# Patient Record
Sex: Female | Born: 1945 | Race: White | Hispanic: No | Marital: Married | Smoking: Never smoker
Health system: Southern US, Community
[De-identification: ages and names within clinical notes are randomized; demographics above are authoritative.]

## PROBLEM LIST (undated history)

## (undated) DIAGNOSIS — D689 Coagulation defect, unspecified: Secondary | ICD-10-CM

## (undated) DIAGNOSIS — E785 Hyperlipidemia, unspecified: Secondary | ICD-10-CM

## (undated) DIAGNOSIS — R040 Epistaxis: Secondary | ICD-10-CM

## (undated) DIAGNOSIS — I10 Essential (primary) hypertension: Secondary | ICD-10-CM

## (undated) DIAGNOSIS — M8580 Other specified disorders of bone density and structure, unspecified site: Secondary | ICD-10-CM

## (undated) DIAGNOSIS — N281 Cyst of kidney, acquired: Secondary | ICD-10-CM

## (undated) DIAGNOSIS — K635 Polyp of colon: Secondary | ICD-10-CM

## (undated) DIAGNOSIS — H35033 Hypertensive retinopathy, bilateral: Secondary | ICD-10-CM

## (undated) DIAGNOSIS — E1121 Type 2 diabetes mellitus with diabetic nephropathy: Secondary | ICD-10-CM

## (undated) DIAGNOSIS — T7840XA Allergy, unspecified, initial encounter: Secondary | ICD-10-CM

## (undated) DIAGNOSIS — D649 Anemia, unspecified: Secondary | ICD-10-CM

## (undated) DIAGNOSIS — L719 Rosacea, unspecified: Secondary | ICD-10-CM

## (undated) DIAGNOSIS — M1711 Unilateral primary osteoarthritis, right knee: Secondary | ICD-10-CM

## (undated) DIAGNOSIS — Z5189 Encounter for other specified aftercare: Secondary | ICD-10-CM

## (undated) DIAGNOSIS — H269 Unspecified cataract: Secondary | ICD-10-CM

## (undated) DIAGNOSIS — K219 Gastro-esophageal reflux disease without esophagitis: Secondary | ICD-10-CM

## (undated) DIAGNOSIS — IMO0002 Reserved for concepts with insufficient information to code with codable children: Secondary | ICD-10-CM

## (undated) DIAGNOSIS — N6310 Unspecified lump in the right breast, unspecified quadrant: Secondary | ICD-10-CM

## (undated) DIAGNOSIS — M1990 Unspecified osteoarthritis, unspecified site: Secondary | ICD-10-CM

## (undated) DIAGNOSIS — R918 Other nonspecific abnormal finding of lung field: Secondary | ICD-10-CM

## (undated) DIAGNOSIS — Z8619 Personal history of other infectious and parasitic diseases: Secondary | ICD-10-CM

## (undated) DIAGNOSIS — Z87440 Personal history of urinary (tract) infections: Secondary | ICD-10-CM

## (undated) DIAGNOSIS — K760 Fatty (change of) liver, not elsewhere classified: Secondary | ICD-10-CM

## (undated) DIAGNOSIS — N200 Calculus of kidney: Secondary | ICD-10-CM

## (undated) DIAGNOSIS — D751 Secondary polycythemia: Secondary | ICD-10-CM

## (undated) DIAGNOSIS — K859 Acute pancreatitis without necrosis or infection, unspecified: Secondary | ICD-10-CM

## (undated) HISTORY — DX: Unspecified osteoarthritis, unspecified site: M19.90

## (undated) HISTORY — DX: Other nonspecific abnormal finding of lung field: R91.8

## (undated) HISTORY — DX: Personal history of urinary (tract) infections: Z87.440

## (undated) HISTORY — DX: Hypertensive retinopathy, bilateral: H35.033

## (undated) HISTORY — DX: Essential (primary) hypertension: I10

## (undated) HISTORY — DX: Fatty (change of) liver, not elsewhere classified: K76.0

## (undated) HISTORY — DX: Other specified disorders of bone density and structure, unspecified site: M85.80

## (undated) HISTORY — PX: JOINT REPLACEMENT: SHX530

## (undated) HISTORY — DX: Unspecified cataract: H26.9

## (undated) HISTORY — DX: Rosacea, unspecified: L71.9

## (undated) HISTORY — DX: Unspecified lump in the right breast, unspecified quadrant: N63.10

## (undated) HISTORY — DX: Coagulation defect, unspecified: D68.9

## (undated) HISTORY — DX: Encounter for other specified aftercare: Z51.89

## (undated) HISTORY — DX: Hyperlipidemia, unspecified: E78.5

## (undated) HISTORY — DX: Reserved for concepts with insufficient information to code with codable children: IMO0002

## (undated) HISTORY — DX: Personal history of other infectious and parasitic diseases: Z86.19

## (undated) HISTORY — DX: Secondary polycythemia: D75.1

## (undated) HISTORY — DX: Cyst of kidney, acquired: N28.1

## (undated) HISTORY — PX: COSMETIC SURGERY: SHX468

## (undated) HISTORY — DX: Anemia, unspecified: D64.9

## (undated) HISTORY — DX: Calculus of kidney: N20.0

## (undated) HISTORY — DX: Allergy, unspecified, initial encounter: T78.40XA

## (undated) HISTORY — DX: Polyp of colon: K63.5

## (undated) HISTORY — DX: Type 2 diabetes mellitus with diabetic nephropathy: E11.21

## (undated) HISTORY — DX: Gastro-esophageal reflux disease without esophagitis: K21.9

---
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
## 1982-11-03 HISTORY — PX: VAGINAL HYSTERECTOMY: SUR661

## 1985-11-03 HISTORY — PX: APPENDECTOMY: SHX54

## 2001-11-03 HISTORY — PX: CHOLECYSTECTOMY: SHX55

## 2001-11-03 HISTORY — PX: OTHER SURGICAL HISTORY: SHX169

## 2001-11-11 ENCOUNTER — Encounter: Payer: Self-pay | Admitting: Emergency Medicine

## 2001-11-11 ENCOUNTER — Emergency Department (HOSPITAL_COMMUNITY): Admission: EM | Admit: 2001-11-11 | Discharge: 2001-11-12 | Payer: Self-pay | Admitting: Emergency Medicine

## 2004-04-03 HISTORY — PX: OTHER SURGICAL HISTORY: SHX169

## 2008-09-03 DIAGNOSIS — K635 Polyp of colon: Secondary | ICD-10-CM

## 2008-09-03 HISTORY — DX: Polyp of colon: K63.5

## 2008-09-26 HISTORY — PX: COLONOSCOPY: SHX174

## 2010-06-03 DIAGNOSIS — K760 Fatty (change of) liver, not elsewhere classified: Secondary | ICD-10-CM | POA: Insufficient documentation

## 2011-12-01 ENCOUNTER — Encounter: Payer: Self-pay | Admitting: Family Medicine

## 2011-12-02 ENCOUNTER — Other Ambulatory Visit: Payer: Self-pay | Admitting: Family Medicine

## 2011-12-02 ENCOUNTER — Encounter: Payer: Self-pay | Admitting: Family Medicine

## 2011-12-02 ENCOUNTER — Ambulatory Visit (INDEPENDENT_AMBULATORY_CARE_PROVIDER_SITE_OTHER): Payer: Medicare Other | Admitting: Family Medicine

## 2011-12-02 DIAGNOSIS — K219 Gastro-esophageal reflux disease without esophagitis: Secondary | ICD-10-CM | POA: Insufficient documentation

## 2011-12-02 DIAGNOSIS — K7689 Other specified diseases of liver: Secondary | ICD-10-CM | POA: Diagnosis not present

## 2011-12-02 DIAGNOSIS — G5790 Unspecified mononeuropathy of unspecified lower limb: Secondary | ICD-10-CM

## 2011-12-02 DIAGNOSIS — K635 Polyp of colon: Secondary | ICD-10-CM

## 2011-12-02 DIAGNOSIS — L719 Rosacea, unspecified: Secondary | ICD-10-CM

## 2011-12-02 DIAGNOSIS — I10 Essential (primary) hypertension: Secondary | ICD-10-CM

## 2011-12-02 DIAGNOSIS — D126 Benign neoplasm of colon, unspecified: Secondary | ICD-10-CM

## 2011-12-02 DIAGNOSIS — K760 Fatty (change of) liver, not elsewhere classified: Secondary | ICD-10-CM

## 2011-12-02 DIAGNOSIS — R209 Unspecified disturbances of skin sensation: Secondary | ICD-10-CM | POA: Diagnosis not present

## 2011-12-02 DIAGNOSIS — D751 Secondary polycythemia: Secondary | ICD-10-CM | POA: Insufficient documentation

## 2011-12-02 DIAGNOSIS — Z87440 Personal history of urinary (tract) infections: Secondary | ICD-10-CM

## 2011-12-02 DIAGNOSIS — R7303 Prediabetes: Secondary | ICD-10-CM

## 2011-12-02 DIAGNOSIS — S139XXA Sprain of joints and ligaments of unspecified parts of neck, initial encounter: Secondary | ICD-10-CM

## 2011-12-02 DIAGNOSIS — R202 Paresthesia of skin: Secondary | ICD-10-CM

## 2011-12-02 DIAGNOSIS — J309 Allergic rhinitis, unspecified: Secondary | ICD-10-CM | POA: Insufficient documentation

## 2011-12-02 DIAGNOSIS — R7309 Other abnormal glucose: Secondary | ICD-10-CM

## 2011-12-02 DIAGNOSIS — E1169 Type 2 diabetes mellitus with other specified complication: Secondary | ICD-10-CM | POA: Insufficient documentation

## 2011-12-02 DIAGNOSIS — E1136 Type 2 diabetes mellitus with diabetic cataract: Secondary | ICD-10-CM | POA: Insufficient documentation

## 2011-12-02 DIAGNOSIS — E785 Hyperlipidemia, unspecified: Secondary | ICD-10-CM

## 2011-12-02 DIAGNOSIS — R7401 Elevation of levels of liver transaminase levels: Secondary | ICD-10-CM

## 2011-12-02 DIAGNOSIS — E118 Type 2 diabetes mellitus with unspecified complications: Secondary | ICD-10-CM | POA: Insufficient documentation

## 2011-12-02 DIAGNOSIS — S161XXA Strain of muscle, fascia and tendon at neck level, initial encounter: Secondary | ICD-10-CM

## 2011-12-02 LAB — CBC WITH DIFFERENTIAL/PLATELET
Basophils Relative: 0.8 % (ref 0.0–3.0)
Eosinophils Absolute: 0 10*3/uL (ref 0.0–0.7)
Lymphocytes Relative: 28 % (ref 12.0–46.0)
MCHC: 34.1 g/dL (ref 30.0–36.0)
Monocytes Absolute: 0.4 10*3/uL (ref 0.1–1.0)
Neutrophils Relative %: 64.3 % (ref 43.0–77.0)
Platelets: 224 10*3/uL (ref 150.0–400.0)
RBC: 5.48 Mil/uL — ABNORMAL HIGH (ref 3.87–5.11)
WBC: 5.9 10*3/uL (ref 4.5–10.5)

## 2011-12-02 LAB — COMPREHENSIVE METABOLIC PANEL
ALT: 54 U/L — ABNORMAL HIGH (ref 0–35)
AST: 32 U/L (ref 0–37)
Albumin: 4.5 g/dL (ref 3.5–5.2)
Alkaline Phosphatase: 47 U/L (ref 39–117)
BUN: 18 mg/dL (ref 6–23)
Calcium: 9.9 mg/dL (ref 8.4–10.5)
Chloride: 107 mEq/L (ref 96–112)
Potassium: 4.9 mEq/L (ref 3.5–5.1)
Sodium: 142 mEq/L (ref 135–145)
Total Protein: 7.6 g/dL (ref 6.0–8.3)

## 2011-12-02 LAB — LIPID PANEL
HDL: 50.3 mg/dL (ref >39.00)
LDL Cholesterol: 72 mg/dL (ref 0–99)
Total CHOL/HDL Ratio: 3

## 2011-12-02 LAB — TSH: TSH: 1.65 u[IU]/mL (ref 0.35–5.50)

## 2011-12-02 MED ORDER — AMLODIPINE BESY-BENAZEPRIL HCL 5-20 MG PO CAPS: 1.0000 | ORAL_CAPSULE | Freq: Every day | ORAL | Status: DC

## 2011-12-02 MED ORDER — OMEGA-3-ACID ETHYL ESTERS 1 G PO CAPS: 2.0000 g | ORAL_CAPSULE | Freq: Two times a day (BID) | ORAL | Status: DC

## 2011-12-02 MED ORDER — FENOFIBRATE 145 MG PO TABS: 145.0000 mg | ORAL_TABLET | Freq: Every day | ORAL | Status: DC

## 2011-12-02 MED ORDER — ROSUVASTATIN CALCIUM 5 MG PO TABS: 5.0000 mg | ORAL_TABLET | Freq: Every day | ORAL | Status: DC

## 2011-12-02 MED ORDER — CEPHALEXIN 250 MG PO CAPS: 250.0000 mg | ORAL_CAPSULE | Freq: Every day | ORAL | Status: DC

## 2011-12-02 NOTE — Assessment & Plan Note (Signed)
Improving on its own. Exam normal. Continue ibuprofen prn, bengay. Update me if not improving as expected.

## 2011-12-02 NOTE — Assessment & Plan Note (Signed)
Check a1c today. rec weight loss.

## 2011-12-02 NOTE — Progress Notes (Addendum)
Subjective:    Patient ID: Katherine Oliver, female    DOB: 11-09-1945, 66 y.o.   MRN: [id number]  HPI CC: new medicare pt presents to establish  Left upper thigh - 85mo h/o small area about 3 in square - gets feeling like has burn, sore, tingling.  No redness, no rash.  Comes and goes during day.  Doesn't wear tight pants.  Feels burning intermittently several times a day, lasts about 15 min.  No associated numbness, weakness of leg, back pain, or shooting pain down leg.  Neck pain - bilateral muscle tightness/spasm.  Wonders if due to recent move.  Bothers her for 1-2 wks then improves.  Uses bengay and ibuprofen which helps.    Recurrent UTIs - on keflex 250mg  daily for ppx, has been on this for several years.  H/o HTN - on lotrel and doing well H/o HLD - tolerating chol meds.  No muscle pains.  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  Likes painting, crafts, sewing, house keeping, gardening.  Occasional walking. Diet: ok water intake 4 glasses/day, daily fruits/vegetables, red meat 4x/wk, fish 2x/wk  Preventative: Last medicare wellness visit - has not had. Td 2006 Pneumonia 2008 Flu shot 2012 Colon - last 2009, rpt due 3-5 yrs, adenomatous polyp.  father with colon cancer. Mammogram - 2012 normal. dexa - 2003, normal.  Medications and allergies reviewed and updated in chart.  Past histories reviewed and updated if relevant as below. Patient Active Problem List  Diagnoses  . Prediabetes  . HTN (hypertension)  . HLD (hyperlipidemia)  . Hepatic steatosis  . History of recurrent UTIs  . Transaminitis  . Colon polyp  . Allergic rhinitis  . GERD (gastroesophageal reflux disease)  . Rosacea   Past Medical History  Diagnosis Date  . Prediabetes   . HTN (hypertension)   . HLD (hyperlipidemia)   . Hepatic steatosis   . History of recurrent UTIs     on  chronic keflex  . Transaminitis 06/2010    normal iron sat and viral hep panel  . 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 Arthritis   . Allergic rhinitis   . History of chicken pox   . History of measles   . Rosacea     metrogel  . GERD (gastroesophageal reflux disease)    Past Surgical History  Procedure Date  . Appendectomy 1987  . Vaginal hysterectomy 1984    for menorrhagia, ovaries in place  . Cholecystectomy 2003  . Dexa 2003    normal  . Cardiolite stress test 04/2004    normal  . Cesarean section [id number]    x2  . Colonoscopy 09/26/2008    adenomatous polyp, rpt 5 yrs  . Trigger finger release 2007;2010;2011    bilateral  . Breast biopsy 1963   History  Substance Use Topics  . Smoking status: Never Smoker   . Smokeless tobacco: Never Used  . Alcohol Use: Yes     Occasional   Family History  Problem Relation Age of Onset  . Stroke Mother     several  . Hyperlipidemia Mother   . Hypertension Mother   . Cancer Father     colon  . Hypertension Father   . Hyperlipidemia Father   . Cancer Paternal Aunt     abdominal  . Coronary artery disease Maternal Grandmother   . Diabetes  Maternal Grandfather   . Coronary artery disease Maternal Grandfather    Allergies  Allergen Reactions  . Sulfa Antibiotics   . Adhesive (Tape) Rash    Paper tape - blisters   No current outpatient prescriptions on file prior to visit.     Review of Systems  Constitutional: Negative for fever, chills, activity change, appetite change, fatigue and unexpected weight change.  HENT: Negative for hearing loss and neck pain.   Eyes: Negative for visual disturbance.  Respiratory: Negative for cough, chest tightness, shortness of breath and wheezing.   Cardiovascular: Negative for chest pain, palpitations and leg swelling.  Gastrointestinal: Positive for diarrhea (thinks from abx, takes immodium prn). Negative for nausea, vomiting, abdominal pain, constipation,  blood in stool and abdominal distention.  Genitourinary: Negative for hematuria and difficulty urinating.  Musculoskeletal: Negative for myalgias and arthralgias.  Skin: Negative for rash.  Neurological: Negative for dizziness, seizures, syncope and headaches.  Hematological: Does not bruise/bleed easily.  Psychiatric/Behavioral: Negative for dysphoric mood. The patient is not nervous/anxious.        Objective:   Physical Exam  Nursing note and vitals reviewed. Constitutional: She is oriented to person, place, and time. She appears well-developed and well-nourished. No distress.  HENT:  Head: Normocephalic and atraumatic.  Right Ear: Hearing, tympanic membrane, external ear and ear canal normal.  Left Ear: Hearing, tympanic membrane, external ear and ear canal normal.  Nose: Nose normal.  Mouth/Throat: Oropharynx is clear and moist. No oropharyngeal exudate.  Eyes: Conjunctivae and EOM are normal. Pupils are equal, round, and reactive to light. No scleral icterus.  Neck: Normal range of motion. Neck supple. Carotid bruit is not present. No thyromegaly present.  Cardiovascular: Normal rate, regular rhythm, normal heart sounds and intact distal pulses.   No murmur heard. Pulses:      Radial pulses are 2+ on the right side, and 2+ on the left side.  Pulmonary/Chest: Effort normal and breath sounds normal. No respiratory distress. She has no wheezes. She has no rales.  Abdominal: Soft. Bowel sounds are normal. She exhibits no distension and no mass. There is no tenderness. There is no rebound and no guarding.  Musculoskeletal: Normal range of motion. She exhibits no edema.       FROM shoulders and neck, no trap tightness/spasm noted, no tenderness  Lymphadenopathy:    She has no cervical adenopathy.  Neurological: She is alert and oriented to person, place, and time.       CN grossly intact, station and gait intact  Skin: Skin is warm and dry. No rash noted.       No rash L anterior  thigh, no erythema  Psychiatric: She has a normal mood and affect. Her behavior is normal. Judgment and thought content normal.      Assessment & Plan:

## 2011-12-02 NOTE — Assessment & Plan Note (Signed)
LFTs today

## 2011-12-02 NOTE — Assessment & Plan Note (Signed)
Due for rpt colonocopy early 2014, late 2013.  fmhx colon cancer (father)

## 2011-12-02 NOTE — Patient Instructions (Signed)
Return in 6 months for medicare wellness visit. Blood work today. Good to meet you today, call us with questions.

## 2011-12-02 NOTE — Assessment & Plan Note (Signed)
Continue keflex.  Has been on this for 4 yrs, has taken ppx abx for 7+ yrs, started by urology.

## 2011-12-02 NOTE — Assessment & Plan Note (Signed)
Isolated, very superficial. Check B12 Continue to monitor for now, notify if progressively worsening.

## 2011-12-02 NOTE — Assessment & Plan Note (Signed)
FLP today 

## 2011-12-02 NOTE — Assessment & Plan Note (Signed)
Continue meds, check blood work.

## 2011-12-31 ENCOUNTER — Other Ambulatory Visit: Payer: Self-pay | Admitting: Family Medicine

## 2011-12-31 ENCOUNTER — Other Ambulatory Visit (INDEPENDENT_AMBULATORY_CARE_PROVIDER_SITE_OTHER): Payer: Medicare Other

## 2011-12-31 ENCOUNTER — Encounter: Payer: Self-pay | Admitting: Family Medicine

## 2011-12-31 DIAGNOSIS — D751 Secondary polycythemia: Secondary | ICD-10-CM

## 2011-12-31 DIAGNOSIS — D45 Polycythemia vera: Secondary | ICD-10-CM | POA: Diagnosis not present

## 2011-12-31 LAB — POCT URINALYSIS DIPSTICK
Blood, UA: NEGATIVE
Ketones, UA: NEGATIVE
Spec Grav, UA: 1.025
pH, UA: 6

## 2011-12-31 LAB — CBC WITH DIFFERENTIAL/PLATELET
Eosinophils Relative: 0.1 % (ref 0.0–5.0)
HCT: 46.7 % — ABNORMAL HIGH (ref 36.0–46.0)
Lymphs Abs: 1.7 10*3/uL (ref 0.7–4.0)
MCV: 85.7 fl (ref 78.0–100.0)
Monocytes Absolute: 0.5 10*3/uL (ref 0.1–1.0)
Neutro Abs: 4.3 10*3/uL (ref 1.4–7.7)
Platelets: 219 10*3/uL (ref 150.0–400.0)
WBC: 6.4 10*3/uL (ref 4.5–10.5)

## 2012-02-09 ENCOUNTER — Telehealth: Payer: Self-pay

## 2012-02-09 NOTE — Telephone Encounter (Signed)
Pt said [facility] charged $200 for Amlodipine-Benazapril 5-20 mg and was told by [facility] if got prescriptions sent to [facility] as separate rx for Amlodipine and Benazapril there would be no copay. Pt request 2 prescriptions sent [facility] rather than [facility] drug. Pt can be reached at [phone].Pt seen 12/02/11.

## 2012-02-10 MED ORDER — BENAZEPRIL HCL 20 MG PO TABS: 20.0000 mg | ORAL_TABLET | Freq: Every day | ORAL | Status: DC

## 2012-02-10 MED ORDER — AMLODIPINE BESYLATE 5 MG PO TABS: 5.0000 mg | ORAL_TABLET | Freq: Every day | ORAL | Status: DC

## 2012-02-10 NOTE — Telephone Encounter (Signed)
Changed and sent in.  plz notify pt.

## 2012-02-10 NOTE — Telephone Encounter (Signed)
Message left with patient's husband that separate meds had been sent in and she should receive those in the mail. He advised he would give her the message.

## 2012-03-01 ENCOUNTER — Encounter: Payer: Self-pay | Admitting: Family Medicine

## 2012-03-01 ENCOUNTER — Ambulatory Visit (INDEPENDENT_AMBULATORY_CARE_PROVIDER_SITE_OTHER)
Admission: RE | Admit: 2012-03-01 | Discharge: 2012-03-01 | Disposition: A | Discharge status: Home / Self Care | Payer: Medicare Other | Source: Ambulatory Visit | Attending: Family Medicine | Admitting: Family Medicine

## 2012-03-01 ENCOUNTER — Ambulatory Visit (INDEPENDENT_AMBULATORY_CARE_PROVIDER_SITE_OTHER): Payer: Medicare Other | Admitting: Family Medicine

## 2012-03-01 VITALS — BP 138/88 | HR 72 | Temp 97.3°F | Wt 171.5 lb

## 2012-03-01 DIAGNOSIS — S90129A Contusion of unspecified lesser toe(s) without damage to nail, initial encounter: Secondary | ICD-10-CM

## 2012-03-01 DIAGNOSIS — M25519 Pain in unspecified shoulder: Secondary | ICD-10-CM

## 2012-03-01 DIAGNOSIS — S90222S Contusion of left lesser toe(s) with damage to nail, sequela: Secondary | ICD-10-CM

## 2012-03-01 DIAGNOSIS — M25511 Pain in right shoulder: Secondary | ICD-10-CM

## 2012-03-01 DIAGNOSIS — W208XXA Other cause of strike by thrown, projected or falling object, initial encounter: Secondary | ICD-10-CM | POA: Diagnosis not present

## 2012-03-01 DIAGNOSIS — M7581 Other shoulder lesions, right shoulder: Secondary | ICD-10-CM | POA: Insufficient documentation

## 2012-03-01 DIAGNOSIS — R221 Localized swelling, mass and lump, neck: Secondary | ICD-10-CM

## 2012-03-01 DIAGNOSIS — R220 Localized swelling, mass and lump, head: Secondary | ICD-10-CM | POA: Diagnosis not present

## 2012-03-01 MED ORDER — NAPROXEN 500 MG PO TABS: ORAL_TABLET | ORAL | Status: DC

## 2012-03-01 NOTE — Patient Instructions (Addendum)
xrays today - we will call you with results and plan - may refer you to orthopedist given continued shoulder pain For now, treat with continued ice, anti inflammatory naprosyn twice daily with food for next 3-5 days then as needed. Good to see you today, call us with questions. Let us know if not improving as expected or any worsening. Left great toe - looks like nailroot injury - lets give this more time.

## 2012-03-01 NOTE — Progress Notes (Signed)
  Subjective:    Patient ID: Katherine Oliver, female    DOB: [birth year], 66 y.o.   MRN: [id number]  HPI CC: check shoulder and toe  Injured right shoulder during move here 09/2011.  That cleared up some but now this past week again moving boxes and exherting herself.  Pain returned.  Pain started front of shoulder, now radiating down entire arm.  Very painful.  Strength decreased. Even lifting cup hurts.  Taking ibuprofen, using ice, not helping.  Denies accidents or falls to incite right shoulder pain.  Did have shoulder problems in years past, s/p PT which helped but only temporarily.    Dropped something on toe 1 month ago, bruising still present.  Initially bruised and tender, overall cleared.  Not tender or irritated.  Now nail staying more white, medial side of great toe staying red.  Wants this checked.  Past Medical History  Diagnosis Date  . Prediabetes   . HTN (hypertension)   . HLD (hyperlipidemia)   . Hepatic steatosis   . History of recurrent UTIs     on chronic keflex  . Transaminitis 06/2010    normal iron sat and viral hep panel  . 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 Arthritis   . Allergic rhinitis   . History of chicken pox   . History of measles   . Rosacea     metrogel  . GERD (gastroesophageal reflux disease)   . Polycythemia    Past Surgical History  Procedure Date  . Appendectomy 1987  . Vaginal hysterectomy 1984    for menorrhagia, ovaries in place  . Cholecystectomy 2003  . Dexa 2003    normal  . Cardiolite stress test 04/2004    normal  . Cesarean section [id number]    x2  . Colonoscopy 09/26/2008    adenomatous polyp, rpt 3-5 yrs  . Trigger finger release 2007;2010;2011    bilateral  . Breast biopsy 1963     Review of Systems Per HPI    Objective:   Physical Exam  Nursing note and vitals reviewed. Constitutional: She appears well-developed and well-nourished. No distress.  HENT:  Head: Normocephalic and  atraumatic.  Mouth/Throat: Oropharynx is clear and moist. No oropharyngeal exudate.  Neck: Normal range of motion. Neck supple.  Cardiovascular: Normal rate, regular rhythm, normal heart sounds and intact distal pulses.   No murmur heard. Pulmonary/Chest: Effort normal and breath sounds normal. No respiratory distress. She has no wheezes. She has no rales.  Musculoskeletal:       No midline spine tenderness.  FROM at neck. Fullness, soft tissue swelling superior to right medial clavicle, nonpulsatile Tender to palpation anterior shoulder and upper arm, nontender posterior shoulder. FROM but tender with ROM at right shoulder. Pain with testing SITS against resistance int/ext rotation at shoulder and positive empty can sign.  Lymphadenopathy:    She has no cervical adenopathy.  Skin: Skin is warm and dry. No rash noted.       Left great toe with some erythema medial to nail root.  Nail with white discoloration. No pain with axial loading.  Psychiatric: She has a normal mood and affect.       Assessment & Plan:

## 2012-03-02 ENCOUNTER — Encounter: Payer: Self-pay | Admitting: Family Medicine

## 2012-03-02 DIAGNOSIS — S90229A Contusion of unspecified lesser toe(s) with damage to nail, initial encounter: Secondary | ICD-10-CM | POA: Insufficient documentation

## 2012-03-02 NOTE — Assessment & Plan Note (Signed)
Advised continue to monitor for now.  Has been healing, but likely slow given location. No evidence of infection or deformity or toe fracture today.

## 2012-03-02 NOTE — Assessment & Plan Note (Addendum)
Mechanism of injury is musculoskeletal, however I don't have good explanation for swelling at base of neck on right compared to left side. Xray of shoulder today - overall normal.  Will obtain neck US to eval for mass/blood clot (doubt) and if normal refer to ortho for further evaluation. ?bursitis given significant pain to palpation. Treat with NSAIDs, ice for now while awaiting ultrasound.

## 2012-03-03 ENCOUNTER — Ambulatory Visit: Payer: Self-pay | Admitting: Family Medicine

## 2012-03-03 DIAGNOSIS — R220 Localized swelling, mass and lump, head: Secondary | ICD-10-CM | POA: Diagnosis not present

## 2012-03-03 DIAGNOSIS — R221 Localized swelling, mass and lump, neck: Secondary | ICD-10-CM | POA: Diagnosis not present

## 2012-03-04 ENCOUNTER — Encounter: Payer: Self-pay | Admitting: Family Medicine

## 2012-03-04 ENCOUNTER — Other Ambulatory Visit: Payer: Self-pay | Admitting: Family Medicine

## 2012-03-04 DIAGNOSIS — M25511 Pain in right shoulder: Secondary | ICD-10-CM

## 2012-03-04 DIAGNOSIS — I10 Essential (primary) hypertension: Secondary | ICD-10-CM

## 2012-03-10 DIAGNOSIS — M67919 Unspecified disorder of synovium and tendon, unspecified shoulder: Secondary | ICD-10-CM | POA: Diagnosis not present

## 2012-03-10 DIAGNOSIS — S139XXA Sprain of joints and ligaments of unspecified parts of neck, initial encounter: Secondary | ICD-10-CM | POA: Diagnosis not present

## 2012-03-10 DIAGNOSIS — M719 Bursopathy, unspecified: Secondary | ICD-10-CM | POA: Diagnosis not present

## 2012-03-15 ENCOUNTER — Encounter: Payer: Self-pay | Admitting: Family Medicine

## 2012-04-07 DIAGNOSIS — S139XXA Sprain of joints and ligaments of unspecified parts of neck, initial encounter: Secondary | ICD-10-CM | POA: Diagnosis not present

## 2012-04-07 DIAGNOSIS — M67919 Unspecified disorder of synovium and tendon, unspecified shoulder: Secondary | ICD-10-CM | POA: Diagnosis not present

## 2012-04-07 DIAGNOSIS — M719 Bursopathy, unspecified: Secondary | ICD-10-CM | POA: Diagnosis not present

## 2012-05-20 ENCOUNTER — Other Ambulatory Visit: Payer: Self-pay | Admitting: Family Medicine

## 2012-05-20 DIAGNOSIS — R7303 Prediabetes: Secondary | ICD-10-CM

## 2012-05-20 DIAGNOSIS — I10 Essential (primary) hypertension: Secondary | ICD-10-CM

## 2012-05-20 DIAGNOSIS — D751 Secondary polycythemia: Secondary | ICD-10-CM

## 2012-05-20 DIAGNOSIS — K760 Fatty (change of) liver, not elsewhere classified: Secondary | ICD-10-CM

## 2012-05-20 DIAGNOSIS — E785 Hyperlipidemia, unspecified: Secondary | ICD-10-CM

## 2012-05-24 ENCOUNTER — Other Ambulatory Visit (INDEPENDENT_AMBULATORY_CARE_PROVIDER_SITE_OTHER): Payer: Medicare Other

## 2012-05-24 DIAGNOSIS — E785 Hyperlipidemia, unspecified: Secondary | ICD-10-CM

## 2012-05-24 DIAGNOSIS — R7309 Other abnormal glucose: Secondary | ICD-10-CM | POA: Diagnosis not present

## 2012-05-24 DIAGNOSIS — K7689 Other specified diseases of liver: Secondary | ICD-10-CM | POA: Diagnosis not present

## 2012-05-24 DIAGNOSIS — R7303 Prediabetes: Secondary | ICD-10-CM

## 2012-05-24 DIAGNOSIS — D751 Secondary polycythemia: Secondary | ICD-10-CM

## 2012-05-24 DIAGNOSIS — K760 Fatty (change of) liver, not elsewhere classified: Secondary | ICD-10-CM

## 2012-05-24 DIAGNOSIS — D45 Polycythemia vera: Secondary | ICD-10-CM

## 2012-05-24 LAB — COMPREHENSIVE METABOLIC PANEL
Alkaline Phosphatase: 43 U/L (ref 39–117)
BUN: 19 mg/dL (ref 6–23)
CO2: 25 mEq/L (ref 19–32)
Creatinine, Ser: 1 mg/dL (ref 0.4–1.2)
GFR: 57.32 mL/min — ABNORMAL LOW (ref >60.00)
Glucose, Bld: 132 mg/dL — ABNORMAL HIGH (ref 70–99)
Sodium: 140 mEq/L (ref 135–145)
Total Bilirubin: 0.5 mg/dL (ref 0.3–1.2)
Total Protein: 7.5 g/dL (ref 6.0–8.3)

## 2012-05-24 LAB — LIPID PANEL
Cholesterol: 166 mg/dL (ref 0–200)
HDL: 48.8 mg/dL (ref >39.00)
Triglycerides: 161 mg/dL — ABNORMAL HIGH (ref 0.0–149.0)
VLDL: 32.2 mg/dL (ref 0.0–40.0)

## 2012-05-24 LAB — CBC WITH DIFFERENTIAL/PLATELET
Basophils Relative: 0.9 % (ref 0.0–3.0)
Eosinophils Relative: 0.1 % (ref 0.0–5.0)
HCT: 46.5 % — ABNORMAL HIGH (ref 36.0–46.0)
Hemoglobin: 15.5 g/dL — ABNORMAL HIGH (ref 12.0–15.0)
Lymphs Abs: 1.4 10*3/uL (ref 0.7–4.0)
MCV: 85.3 fl (ref 78.0–100.0)
Monocytes Absolute: 0.5 10*3/uL (ref 0.1–1.0)
Monocytes Relative: 9.7 % (ref 3.0–12.0)
Platelets: 237 10*3/uL (ref 150.0–400.0)
RBC: 5.45 Mil/uL — ABNORMAL HIGH (ref 3.87–5.11)
WBC: 4.8 10*3/uL (ref 4.5–10.5)

## 2012-05-24 LAB — HEMOGLOBIN A1C: Hgb A1c MFr Bld: 5.9 % (ref 4.6–6.5)

## 2012-05-31 ENCOUNTER — Ambulatory Visit (INDEPENDENT_AMBULATORY_CARE_PROVIDER_SITE_OTHER): Payer: Medicare Other | Admitting: Family Medicine

## 2012-05-31 ENCOUNTER — Encounter: Payer: Self-pay | Admitting: Family Medicine

## 2012-05-31 VITALS — BP 120/78 | HR 81 | Temp 98.1°F | Ht 61.0 in | Wt 171.2 lb

## 2012-05-31 DIAGNOSIS — R7303 Prediabetes: Secondary | ICD-10-CM

## 2012-05-31 DIAGNOSIS — R7309 Other abnormal glucose: Secondary | ICD-10-CM

## 2012-05-31 DIAGNOSIS — Z0000 Encounter for general adult medical examination without abnormal findings: Secondary | ICD-10-CM | POA: Insufficient documentation

## 2012-05-31 DIAGNOSIS — D126 Benign neoplasm of colon, unspecified: Secondary | ICD-10-CM

## 2012-05-31 DIAGNOSIS — D45 Polycythemia vera: Secondary | ICD-10-CM

## 2012-05-31 DIAGNOSIS — K635 Polyp of colon: Secondary | ICD-10-CM

## 2012-05-31 DIAGNOSIS — K760 Fatty (change of) liver, not elsewhere classified: Secondary | ICD-10-CM

## 2012-05-31 DIAGNOSIS — Z23 Encounter for immunization: Secondary | ICD-10-CM

## 2012-05-31 DIAGNOSIS — E785 Hyperlipidemia, unspecified: Secondary | ICD-10-CM

## 2012-05-31 DIAGNOSIS — K7689 Other specified diseases of liver: Secondary | ICD-10-CM | POA: Diagnosis not present

## 2012-05-31 DIAGNOSIS — I10 Essential (primary) hypertension: Secondary | ICD-10-CM

## 2012-05-31 DIAGNOSIS — S90129A Contusion of unspecified lesser toe(s) without damage to nail, initial encounter: Secondary | ICD-10-CM

## 2012-05-31 DIAGNOSIS — R7401 Elevation of levels of liver transaminase levels: Secondary | ICD-10-CM

## 2012-05-31 DIAGNOSIS — D751 Secondary polycythemia: Secondary | ICD-10-CM

## 2012-05-31 DIAGNOSIS — S90229A Contusion of unspecified lesser toe(s) with damage to nail, initial encounter: Secondary | ICD-10-CM

## 2012-05-31 NOTE — Assessment & Plan Note (Signed)
Reviewed #s with pt. Has made conscious effort to increase fish in diet. lovaza becoming too expensive, asks about switch to OTC fish oil capsules. Ok to do.  Start by decreasing lovaza to 2gms daily, then convert to 2gm fish oil daily. Continue tricor and crestor.

## 2012-05-31 NOTE — Assessment & Plan Note (Signed)
check abd [place]. If normal, consider chol meds as cause?  As mild and stable, no changes today.

## 2012-05-31 NOTE — Assessment & Plan Note (Signed)
Doubt toenail fungal infection. rec start biotin.

## 2012-05-31 NOTE — Addendum Note (Signed)
Addended byBo Mcclintock on: 05/31/2012 02:12 PM   Modules accepted: Orders

## 2012-05-31 NOTE — Patient Instructions (Addendum)
Call us in October to set up mammogram. Pneumonia shot today. Pass by [name]'s office today to schedule colonoscopy. Advanced directive packet provided today. May decrease lovaza to 2 pills daily, then when you run out change to fish oil 2grams daily.  Good job with fish in diet.  Increase walking with husband. For liver - we will also set you up with abdominal ultrasound.  return in 2 months for some further blood work to check elevated blood count - if hemoglobin staying high, may refer you to blood doctor. Good to see you today, call us with questions.

## 2012-05-31 NOTE — Progress Notes (Signed)
Subjective:    Patient ID: Katherine Oliver, female    DOB: 05-Jan-1946, 66 y.o.   MRN: [id number]  HPI CC: Medicare wellness visit  Presents with husband.  Has some questions about meds.   HLD - lovaza very expensive, wonders if truly needs.  Has started eating more salmon, eating 2x/wk.  On tricor in am and crestor in pm.  Denies myalgias.  H/o mild transaminitis.  Normal viral hep panel and iron studies 2011.  Doesn't think has had abd imaging done in past.   H/o polycythemia, no h/o smoking.  Staying elevated.  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 Likes painting, crafts, sewing, house keeping, gardening. Occasional walking.  Diet: ok water intake 4 glasses/day, daily fruits/vegetables, red meat 4x/wk, fish 3-4x/wk   Preventative:  Last medicare wellness visit - has not had. Td 2006  Pneumonia 2008 (done early 2/2 h/o bronchitis).  Would like pneumovax today. Flu shot 2012  Shingles shot 2010 Colon - last 2009, rpt due 3-5 yrs, adenomatous polyp.  Father with colon cancer.  Would like to schedule rpt colonoscopy. Mammogram - 08/2011 normal.  Would like [place] to schedule rpt. Pap smear - stopped 3-4 yrs ago, s/p hysterectomy for heavy bleeding.  Both ovaries remain. dexa - 2003, normal.  May be due for repeat. Advanced directives: husband [name] is [place].  Hearing and vision screening normal today.  No falls in last year. Denies anger, depression, anhedonia, sadness.  Wt Readings from Last 3 Encounters:  05/31/12 171 lb 4 oz (77.678 kg)  03/01/12 171 lb 8 oz (77.792 kg)  12/02/11 167 lb 4 oz (75.864 kg)   Medications and allergies reviewed and updated in chart.  Past histories reviewed and updated if relevant as below. Patient Active Problem List  Diagnosis  . Prediabetes  . HTN (hypertension)  . HLD (hyperlipidemia)  . Hepatic steatosis  .  History of recurrent UTIs  . Transaminitis  . Colon polyp  . Allergic rhinitis  . GERD (gastroesophageal reflux disease)  . Rosacea  . Neuropathy of thigh  . Neck strain  . Polycythemia  . Rotator cuff tendonitis, right  . Toenail bruise, left, sequela   Past Medical History  Diagnosis Date  . Prediabetes   . HTN (hypertension)   . HLD (hyperlipidemia)   . Hepatic steatosis     ?presumed  . History of recurrent UTIs     on chronic keflex  . Transaminitis 06/2010    normal iron sat and viral hep panel  . 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 Arthritis   . Allergic rhinitis   . History of chicken pox   . History of measles   . Rosacea     metrogel  . GERD (gastroesophageal reflux disease)   . Polycythemia    Past Surgical History  Procedure Date  . Appendectomy 1987  . Vaginal hysterectomy 1984    for menorrhagia, ovaries in place  . Cholecystectomy 2003  . Dexa 2003    normal  . Cardiolite stress test 04/2004    normal  . Cesarean section [id number]    x2  . Colonoscopy 09/26/2008    adenomatous polyp, rpt 3-5 yrs  . Trigger finger release 2007;2010;2011    bilateral  . Breast biopsy 1963   History  Substance Use Topics  . Smoking status: Never Smoker   .  Smokeless tobacco: Never Used  . Alcohol Use: Yes     Occasional   Family History  Problem Relation Age of Onset  . Stroke Mother     several  . Hyperlipidemia Mother   . Hypertension Mother   . Cancer Father     colon  . Hypertension Father   . Hyperlipidemia Father   . Cancer Paternal Aunt     abdominal  . Coronary artery disease Maternal Grandmother   . Diabetes Maternal Grandfather   . Coronary artery disease Maternal Grandfather   . Coronary artery disease Father 25    MIx1, CABG   Allergies  Allergen Reactions  . Sulfa Antibiotics   . Adhesive (Tape) Rash    Paper tape - blisters   Current Outpatient Prescriptions on File Prior to Visit  Medication Sig  Dispense Refill  . amLODipine (NORVASC) 5 MG tablet Take 1 tablet (5 mg total) by mouth daily.  90 tablet  3  . benazepril (LOTENSIN) 20 MG tablet Take 1 tablet (20 mg total) by mouth daily.  90 tablet  3  . cephALEXin (KEFLEX) 250 MG capsule Take 1 capsule (250 mg total) by mouth daily.  90 capsule  3  . fenofibrate (TRICOR) 145 MG tablet Take 1 tablet (145 mg total) by mouth daily.  90 tablet  3  . fluticasone (FLONASE) 50 MCG/ACT nasal spray Place 2 sprays into the nose daily. As needed      . loperamide (IMODIUM) 2 MG capsule Take 2 mg by mouth as needed.      . metronidazole (NORITATE) 1 % cream Apply 1 application topically daily as needed.      . rosuvastatin (CRESTOR) 5 MG tablet Take 1 tablet (5 mg total) by mouth at bedtime.  90 tablet  3  . DISCONTD: omega-3 acid ethyl esters (LOVAZA) 1 G capsule Take 2 capsules (2 g total) by mouth 2 (two) times daily.  360 capsule  3    Review of Systems  Constitutional: Negative for fever, chills, activity change, appetite change, fatigue and unexpected weight change.  HENT: Negative for hearing loss and neck pain.   Eyes: Negative for visual disturbance.  Respiratory: Negative for cough, chest tightness, shortness of breath and wheezing.   Cardiovascular: Negative for chest pain, palpitations and leg swelling.  Gastrointestinal: Negative for nausea, vomiting, abdominal pain, diarrhea (improved), constipation, blood in stool and abdominal distention.  Genitourinary: Negative for hematuria and difficulty urinating.  Musculoskeletal: Negative for myalgias and arthralgias.  Skin: Negative for rash.  Neurological: Negative for dizziness, seizures, syncope and headaches.  Hematological: Does not bruise/bleed easily.  Psychiatric/Behavioral: Negative for dysphoric mood. The patient is not nervous/anxious.        Objective:   Physical Exam  Nursing note and vitals reviewed. Constitutional: She is oriented to person, place, and time. She appears  well-developed and well-nourished. No distress.  HENT:  Head: Normocephalic and atraumatic.  Right Ear: Hearing, tympanic membrane, external ear and ear canal normal.  Left Ear: Hearing, tympanic membrane, external ear and ear canal normal.  Nose: Nose normal.  Mouth/Throat: Oropharynx is clear and moist and mucous membranes are normal. No oropharyngeal exudate, posterior oropharyngeal edema, posterior oropharyngeal erythema or tonsillar abscesses.  Eyes: Conjunctivae and EOM are normal. Pupils are equal, round, and reactive to light. No scleral icterus.  Neck: Normal range of motion. Neck supple. Carotid bruit is not present. No thyromegaly present.  Cardiovascular: Normal rate, regular rhythm, normal heart sounds and intact distal pulses.  No murmur heard. Pulses:      Radial pulses are 2+ on the right side, and 2+ on the left side.  Pulmonary/Chest: Effort normal and breath sounds normal. No respiratory distress. She has no wheezes. She has no rales. Right breast exhibits no inverted nipple, no mass, no nipple discharge, no skin change and no tenderness. Left breast exhibits no inverted nipple, no mass, no nipple discharge, no skin change and no tenderness. Breasts are symmetrical.  Abdominal: Soft. Bowel sounds are normal. She exhibits no distension and no mass. There is no tenderness. There is no rebound and no guarding.  Genitourinary:       deferred  Musculoskeletal: Normal range of motion. She exhibits no edema.  Lymphadenopathy:    She has no cervical adenopathy.    She has no axillary adenopathy.       Right axillary: No lateral adenopathy present.       Left axillary: No lateral adenopathy present.      Right: No supraclavicular adenopathy present.       Left: No supraclavicular adenopathy present.  Neurological: She is alert and oriented to person, place, and time.       CN grossly intact, station and gait intact  Skin: Skin is warm and dry. No rash noted.  Psychiatric: She  has a normal mood and affect. Her behavior is normal. Judgment and thought content normal.       Assessment & Plan:

## 2012-05-31 NOTE — Assessment & Plan Note (Signed)
Lab Results  Component Value Date   HGBA1C 5.9 05/24/2012  reviewed labs. Discussed dietary changes to improve sugars.  Discussed improtance of weight loss. Thinks she may be able to walk more.

## 2012-05-31 NOTE — Assessment & Plan Note (Signed)
Chronic, stable, continue meds

## 2012-05-31 NOTE — Assessment & Plan Note (Signed)
Rpt colonscopy given fmhx, personal hx adenomatous polyp

## 2012-05-31 NOTE — Assessment & Plan Note (Addendum)
Present for last several months. Nonsmoker. epo level normal. With transaminitis, check abd [place] to r/o hepatoma.

## 2012-05-31 NOTE — Assessment & Plan Note (Signed)
I have personally reviewed the Medicare Annual Wellness questionnaire and have noted 1. The patient's medical and social history 2. Their use of alcohol, tobacco or illicit drugs 3. Their current medications and supplements 4. The patient's functional ability including ADL's, fall risks, home safety risks and hearing or visual impairment. 5. Diet and physical activity 6. Evidence for depression or mood disorders The patients weight, height, BMI have been recorded in the chart.  Hearing and vision has been addressed. I have made referrals, counseling and provided education to the patient based review of the above and I have provided the pt with a written personalized care plan for preventive services. See scanned questionairre. Advanced directives discussed: husband is [place]CP[place].  Would like advanced directives info - packet provided.  Reviewed preventative protocols and updated unless pt declined. Pneumovax today. Schedule colonoscopy. Schedule mammogram 08/2012.

## 2012-05-31 NOTE — Assessment & Plan Note (Signed)
I believe this is presumptive dx as no imaging done in past that pt is aware of. Will order abd [place] for longstanding mild transaminitis.

## 2012-06-02 ENCOUNTER — Ambulatory Visit: Payer: Self-pay | Admitting: Family Medicine

## 2012-06-02 ENCOUNTER — Encounter: Payer: Self-pay | Admitting: Family Medicine

## 2012-06-02 DIAGNOSIS — K7689 Other specified diseases of liver: Secondary | ICD-10-CM | POA: Diagnosis not present

## 2012-06-03 ENCOUNTER — Encounter: Payer: Self-pay | Admitting: Family Medicine

## 2012-06-30 DIAGNOSIS — M67919 Unspecified disorder of synovium and tendon, unspecified shoulder: Secondary | ICD-10-CM | POA: Diagnosis not present

## 2012-06-30 DIAGNOSIS — M719 Bursopathy, unspecified: Secondary | ICD-10-CM | POA: Diagnosis not present

## 2012-06-30 DIAGNOSIS — S139XXA Sprain of joints and ligaments of unspecified parts of neck, initial encounter: Secondary | ICD-10-CM | POA: Diagnosis not present

## 2012-08-01 ENCOUNTER — Other Ambulatory Visit: Payer: Self-pay | Admitting: Family Medicine

## 2012-08-01 DIAGNOSIS — K760 Fatty (change of) liver, not elsewhere classified: Secondary | ICD-10-CM

## 2012-08-01 DIAGNOSIS — R7401 Elevation of levels of liver transaminase levels: Secondary | ICD-10-CM

## 2012-08-01 DIAGNOSIS — D751 Secondary polycythemia: Secondary | ICD-10-CM

## 2012-08-02 ENCOUNTER — Encounter: Payer: Self-pay | Admitting: *Deleted

## 2012-08-02 ENCOUNTER — Other Ambulatory Visit (INDEPENDENT_AMBULATORY_CARE_PROVIDER_SITE_OTHER): Payer: Medicare Other

## 2012-08-02 ENCOUNTER — Encounter: Payer: Self-pay | Admitting: Family Medicine

## 2012-08-02 DIAGNOSIS — R7401 Elevation of levels of liver transaminase levels: Secondary | ICD-10-CM

## 2012-08-02 DIAGNOSIS — D45 Polycythemia vera: Secondary | ICD-10-CM | POA: Diagnosis not present

## 2012-08-02 DIAGNOSIS — D751 Secondary polycythemia: Secondary | ICD-10-CM

## 2012-08-02 LAB — CBC WITH DIFFERENTIAL/PLATELET
Basophils Absolute: 0 10*3/uL (ref 0.0–0.1)
Eosinophils Absolute: 0 10*3/uL (ref 0.0–0.7)
HCT: 47.7 % — ABNORMAL HIGH (ref 36.0–46.0)
Hemoglobin: 15.4 g/dL — ABNORMAL HIGH (ref 12.0–15.0)
Lymphs Abs: 1.5 10*3/uL (ref 0.7–4.0)
MCHC: 32.3 g/dL (ref 30.0–36.0)
Monocytes Absolute: 0.4 10*3/uL (ref 0.1–1.0)
Neutro Abs: 2.7 10*3/uL (ref 1.4–7.7)
RDW: 13.9 % (ref 11.5–14.6)

## 2012-08-02 LAB — HEPATIC FUNCTION PANEL: Albumin: 4.3 g/dL (ref 3.5–5.2)

## 2012-08-03 HISTORY — PX: COLONOSCOPY: SHX174

## 2012-08-04 DIAGNOSIS — Z86010 Personal history of colonic polyps: Secondary | ICD-10-CM | POA: Diagnosis not present

## 2012-08-05 ENCOUNTER — Encounter: Payer: Self-pay | Admitting: *Deleted

## 2012-08-05 ENCOUNTER — Ambulatory Visit: Payer: Self-pay | Admitting: Family Medicine

## 2012-08-05 ENCOUNTER — Encounter: Payer: Self-pay | Admitting: Family Medicine

## 2012-08-05 DIAGNOSIS — R928 Other abnormal and inconclusive findings on diagnostic imaging of breast: Secondary | ICD-10-CM | POA: Diagnosis not present

## 2012-08-05 DIAGNOSIS — Z23 Encounter for immunization: Secondary | ICD-10-CM | POA: Diagnosis not present

## 2012-08-05 DIAGNOSIS — Z1231 Encounter for screening mammogram for malignant neoplasm of breast: Secondary | ICD-10-CM | POA: Diagnosis not present

## 2012-08-27 DIAGNOSIS — R7401 Elevation of levels of liver transaminase levels: Secondary | ICD-10-CM | POA: Diagnosis not present

## 2012-08-31 ENCOUNTER — Ambulatory Visit: Payer: Self-pay | Admitting: Gastroenterology

## 2012-08-31 DIAGNOSIS — E785 Hyperlipidemia, unspecified: Secondary | ICD-10-CM | POA: Diagnosis not present

## 2012-08-31 DIAGNOSIS — Z09 Encounter for follow-up examination after completed treatment for conditions other than malignant neoplasm: Secondary | ICD-10-CM | POA: Diagnosis not present

## 2012-08-31 DIAGNOSIS — Z800 Family history of malignant neoplasm of digestive organs: Secondary | ICD-10-CM | POA: Diagnosis not present

## 2012-08-31 DIAGNOSIS — D126 Benign neoplasm of colon, unspecified: Secondary | ICD-10-CM | POA: Diagnosis not present

## 2012-08-31 DIAGNOSIS — Z90710 Acquired absence of both cervix and uterus: Secondary | ICD-10-CM | POA: Diagnosis not present

## 2012-08-31 DIAGNOSIS — Z860100 Personal history of colon polyps, unspecified: Secondary | ICD-10-CM | POA: Diagnosis not present

## 2012-08-31 DIAGNOSIS — Z882 Allergy status to sulfonamides status: Secondary | ICD-10-CM | POA: Diagnosis not present

## 2012-08-31 DIAGNOSIS — I10 Essential (primary) hypertension: Secondary | ICD-10-CM | POA: Diagnosis not present

## 2012-08-31 DIAGNOSIS — Z9089 Acquired absence of other organs: Secondary | ICD-10-CM | POA: Diagnosis not present

## 2012-08-31 DIAGNOSIS — K5730 Diverticulosis of large intestine without perforation or abscess without bleeding: Secondary | ICD-10-CM | POA: Diagnosis not present

## 2012-08-31 DIAGNOSIS — K7689 Other specified diseases of liver: Secondary | ICD-10-CM | POA: Diagnosis not present

## 2012-08-31 DIAGNOSIS — Z79899 Other long term (current) drug therapy: Secondary | ICD-10-CM | POA: Diagnosis not present

## 2012-09-07 ENCOUNTER — Other Ambulatory Visit: Payer: Self-pay | Admitting: *Deleted

## 2012-09-07 MED ORDER — CEPHALEXIN 250 MG PO CAPS: 250.0000 mg | ORAL_CAPSULE | Freq: Every day | ORAL | Status: DC

## 2012-11-05 ENCOUNTER — Other Ambulatory Visit: Payer: Self-pay | Admitting: Family Medicine

## 2012-12-02 ENCOUNTER — Ambulatory Visit (INDEPENDENT_AMBULATORY_CARE_PROVIDER_SITE_OTHER): Payer: Medicare Other | Admitting: Family Medicine

## 2012-12-02 ENCOUNTER — Encounter: Payer: Self-pay | Admitting: Family Medicine

## 2012-12-02 VITALS — BP 134/92 | HR 80 | Temp 98.2°F | Wt 174.8 lb

## 2012-12-02 DIAGNOSIS — K219 Gastro-esophageal reflux disease without esophagitis: Secondary | ICD-10-CM

## 2012-12-02 DIAGNOSIS — Z87440 Personal history of urinary (tract) infections: Secondary | ICD-10-CM

## 2012-12-02 DIAGNOSIS — D126 Benign neoplasm of colon, unspecified: Secondary | ICD-10-CM

## 2012-12-02 DIAGNOSIS — I10 Essential (primary) hypertension: Secondary | ICD-10-CM

## 2012-12-02 DIAGNOSIS — K635 Polyp of colon: Secondary | ICD-10-CM

## 2012-12-02 MED ORDER — FLUTICASONE PROPIONATE 50 MCG/ACT NA SUSP: 2.0000 | Freq: Every day | NASAL | Status: DC

## 2012-12-02 MED ORDER — CEPHALEXIN 250 MG PO CAPS: 250.0000 mg | ORAL_CAPSULE | Freq: Every day | ORAL | Status: DC

## 2012-12-02 NOTE — Progress Notes (Signed)
  Subjective:    Patient ID: Katherine Oliver, female    DOB: 11/11/1945, 67 y.o.   MRN: [id number]  HPI CC: 6 mo f/u  No questions or concerns.  Recurrent UTIs - controlled on keflex 250mg  daily.  HTN - No HA, vision changes, CP/tightness, SOB, leg swelling.  Compliant with amlodipine and benazepril.  At home running systolic [id number].  Avoids salt.  Exercise - walking, house work, gardening.  Good vegetable intake  Colonoscopy 08/2012 - 1 adenomatous polyp, with diverticulosis, rec rpt 5 yrs ([place]).  No records of this - will fill ROI to receive records  Shoulders starting to bother her again - planning on scheduling ortho f/u.  GERD - controlled with OTC antihistamine  Flu shot done.  Past Medical History  Diagnosis Date  . Prediabetes   . HTN (hypertension)   . HLD (hyperlipidemia)   . Hepatic steatosis     by abd [place] 05/2012, mild transaminitis - normal iron sat and viral hep panel (2011)  . History of recurrent UTIs     on chronic keflex  . 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 Arthritis   . Allergic rhinitis   . History of chicken pox   . History of measles   . Rosacea     metrogel  . GERD (gastroesophageal reflux disease)   . Polycythemia     mild, stable (2013)     Review of Systems Per HPI    Objective:   Physical Exam  Nursing note and vitals reviewed. Constitutional: She appears well-developed and well-nourished. No distress.  HENT:  Head: Normocephalic and atraumatic.  Mouth/Throat: Oropharynx is clear and moist. No oropharyngeal exudate.  Eyes: Conjunctivae normal and EOM are normal. Pupils are equal, round, and reactive to light. No scleral icterus.  Neck: Normal range of motion. Neck supple. Carotid bruit is not present. No thyromegaly present.  Cardiovascular: Normal rate, regular rhythm, normal heart sounds and intact distal pulses.   No murmur heard. Pulmonary/Chest: Effort normal and breath sounds normal. No  respiratory distress. She has no wheezes. She has no rales.  Musculoskeletal: She exhibits no edema.  Lymphadenopathy:    She has no cervical adenopathy.  Skin: Skin is warm and dry. No rash noted.        Assessment & Plan:

## 2012-12-02 NOTE — Assessment & Plan Note (Signed)
Chronic, stable. Continue meds. 

## 2012-12-02 NOTE — Assessment & Plan Note (Signed)
Controlled with diet and OTC meds.

## 2012-12-02 NOTE — Assessment & Plan Note (Signed)
Keflex refilled to [facility].

## 2012-12-02 NOTE — Assessment & Plan Note (Signed)
Will request records from [facility]

## 2012-12-02 NOTE — Patient Instructions (Signed)
Sign release form for recent colonoscopy from [facility]. Return in 6 months fasting for blood work and afterwards for [employer] visit.

## 2012-12-14 ENCOUNTER — Encounter: Payer: Self-pay | Admitting: Family Medicine

## 2012-12-24 ENCOUNTER — Encounter: Payer: Self-pay | Admitting: Family Medicine

## 2013-02-24 DIAGNOSIS — S43429A Sprain of unspecified rotator cuff capsule, initial encounter: Secondary | ICD-10-CM | POA: Diagnosis not present

## 2013-03-04 DIAGNOSIS — M19019 Primary osteoarthritis, unspecified shoulder: Secondary | ICD-10-CM | POA: Diagnosis not present

## 2013-05-25 ENCOUNTER — Other Ambulatory Visit: Payer: Self-pay | Admitting: Family Medicine

## 2013-05-25 DIAGNOSIS — Z0000 Encounter for general adult medical examination without abnormal findings: Secondary | ICD-10-CM

## 2013-05-25 DIAGNOSIS — D751 Secondary polycythemia: Secondary | ICD-10-CM

## 2013-05-25 DIAGNOSIS — I10 Essential (primary) hypertension: Secondary | ICD-10-CM

## 2013-05-25 DIAGNOSIS — K760 Fatty (change of) liver, not elsewhere classified: Secondary | ICD-10-CM

## 2013-05-25 DIAGNOSIS — R7303 Prediabetes: Secondary | ICD-10-CM

## 2013-05-25 DIAGNOSIS — E785 Hyperlipidemia, unspecified: Secondary | ICD-10-CM

## 2013-05-26 ENCOUNTER — Other Ambulatory Visit (INDEPENDENT_AMBULATORY_CARE_PROVIDER_SITE_OTHER): Payer: Medicare Other

## 2013-05-26 DIAGNOSIS — D45 Polycythemia vera: Secondary | ICD-10-CM | POA: Diagnosis not present

## 2013-05-26 DIAGNOSIS — D751 Secondary polycythemia: Secondary | ICD-10-CM

## 2013-05-26 DIAGNOSIS — K7689 Other specified diseases of liver: Secondary | ICD-10-CM | POA: Diagnosis not present

## 2013-05-26 DIAGNOSIS — R7309 Other abnormal glucose: Secondary | ICD-10-CM | POA: Diagnosis not present

## 2013-05-26 DIAGNOSIS — R7303 Prediabetes: Secondary | ICD-10-CM

## 2013-05-26 DIAGNOSIS — E785 Hyperlipidemia, unspecified: Secondary | ICD-10-CM | POA: Diagnosis not present

## 2013-05-26 DIAGNOSIS — I10 Essential (primary) hypertension: Secondary | ICD-10-CM

## 2013-05-26 DIAGNOSIS — K760 Fatty (change of) liver, not elsewhere classified: Secondary | ICD-10-CM

## 2013-05-26 LAB — CBC WITH DIFFERENTIAL/PLATELET
Basophils Absolute: 0 10*3/uL (ref 0.0–0.1)
Basophils Relative: 0.7 % (ref 0.0–3.0)
Eosinophils Relative: 0 % (ref 0.0–5.0)
Hemoglobin: 14.8 g/dL (ref 12.0–15.0)
Lymphocytes Relative: 33.1 % (ref 12.0–46.0)
Monocytes Relative: 9.4 % (ref 3.0–12.0)
Neutro Abs: 2.9 10*3/uL (ref 1.4–7.7)
RBC: 5.12 Mil/uL — ABNORMAL HIGH (ref 3.87–5.11)
WBC: 5 10*3/uL (ref 4.5–10.5)

## 2013-05-26 LAB — LIPID PANEL
HDL: 44.3 mg/dL (ref >39.00)
Total CHOL/HDL Ratio: 3
Triglycerides: 155 mg/dL — ABNORMAL HIGH (ref 0.0–149.0)
VLDL: 31 mg/dL (ref 0.0–40.0)

## 2013-05-26 LAB — COMPREHENSIVE METABOLIC PANEL
Alkaline Phosphatase: 44 U/L (ref 39–117)
CO2: 25 mEq/L (ref 19–32)
Creatinine, Ser: 0.9 mg/dL (ref 0.4–1.2)
GFR: 68.66 mL/min (ref >60.00)
Glucose, Bld: 113 mg/dL — ABNORMAL HIGH (ref 70–99)
Total Bilirubin: 0.6 mg/dL (ref 0.3–1.2)

## 2013-05-26 LAB — HEMOGLOBIN A1C: Hgb A1c MFr Bld: 6.1 % (ref 4.6–6.5)

## 2013-06-02 ENCOUNTER — Encounter: Payer: Self-pay | Admitting: Family Medicine

## 2013-06-02 ENCOUNTER — Ambulatory Visit (INDEPENDENT_AMBULATORY_CARE_PROVIDER_SITE_OTHER): Payer: Medicare Other | Admitting: Family Medicine

## 2013-06-02 VITALS — BP 160/100 | HR 88 | Temp 98.0°F | Ht 61.0 in | Wt 175.5 lb

## 2013-06-02 DIAGNOSIS — I10 Essential (primary) hypertension: Secondary | ICD-10-CM

## 2013-06-02 DIAGNOSIS — D45 Polycythemia vera: Secondary | ICD-10-CM

## 2013-06-02 DIAGNOSIS — N951 Menopausal and female climacteric states: Secondary | ICD-10-CM | POA: Diagnosis not present

## 2013-06-02 DIAGNOSIS — E785 Hyperlipidemia, unspecified: Secondary | ICD-10-CM | POA: Diagnosis not present

## 2013-06-02 DIAGNOSIS — L719 Rosacea, unspecified: Secondary | ICD-10-CM

## 2013-06-02 DIAGNOSIS — Z0000 Encounter for general adult medical examination without abnormal findings: Secondary | ICD-10-CM

## 2013-06-02 DIAGNOSIS — Z87440 Personal history of urinary (tract) infections: Secondary | ICD-10-CM | POA: Diagnosis not present

## 2013-06-02 DIAGNOSIS — R7309 Other abnormal glucose: Secondary | ICD-10-CM

## 2013-06-02 DIAGNOSIS — R7303 Prediabetes: Secondary | ICD-10-CM

## 2013-06-02 DIAGNOSIS — D751 Secondary polycythemia: Secondary | ICD-10-CM

## 2013-06-02 MED ORDER — DOXYCYCLINE MONOHYDRATE 50 MG PO TABS: 50.0000 mg | ORAL_TABLET | Freq: Every day | ORAL | Status: DC

## 2013-06-02 MED ORDER — AMLODIPINE BESYLATE 10 MG PO TABS: 10.0000 mg | ORAL_TABLET | Freq: Every day | ORAL | Status: DC

## 2013-06-02 NOTE — Assessment & Plan Note (Signed)
Stop keflex, continue doxycycline 50mg  daily.  Monitor for recurrence. Pt agrees with plan.

## 2013-06-02 NOTE — Assessment & Plan Note (Signed)
Improved. Continue to monitor. 

## 2013-06-02 NOTE — Progress Notes (Signed)
Subjective:    Patient ID: Katherine Oliver, female    DOB: [birth year], 67 y.o.   MRN: [id number]  HPI CC: medicare wellness visit  HTN - BP occasionally elevated at home (once a month).  May slowly be trending up over last 2 years. Rosacea - on doxy 50mg  daily - started after metrogel wasn't helping control. Recurrent UTIs - on keflex 250mg  daily for the last 5 years. Currently taking both keflex 250mg  and doxycycline 50mg  daily.  R ear very itchy - would like evaluated.  Has tried rubbing alcohol and peroxide.  No hearing changes. Concern about weight - despite decreasing portion sizes, increased walking, trouble losing weight.   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 Likes painting, crafts, sewing, house keeping, gardening. Occasional walking.  Diet: ok water intake 4 glasses/day, daily fruits/vegetables, red meat 4x/wk, fish 3-4x/wk   Preventative:  Colon - adenomatous polyps, diverticulosis, rec rpt 5 yrs [name]) Father with colon cancer age 32s.  Mammogram - due 08/2013  Pap smear - stopped 4-5 yrs ago, s/p hysterectomy for heavy bleeding. Both ovaries remain.  Declines continued pelvic exam, discussed changes to monitor for. dexa - 2003, normal. Due for rpt. Pneumovax 05/2012  Td 2006  Flu shot 2013  Shingles shot 2010  Advanced directives: husband [name] is [place]. Has at home.  Hearing and vision screening normal today.  No falls in last year.  Denies anger, depression, anhedonia, sadness.  Medications and allergies reviewed and updated in chart.  Past histories reviewed and updated if relevant as below. Patient Active Problem List   Diagnosis Date Noted  . Medicare annual wellness visit, subsequent  05/31/2012  . Contusion of toenail 03/02/2012  . Rotator cuff tendonitis, right 03/01/2012  . 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 Polycythemia 12/02/2011  . Prediabetes   . HTN (hypertension)   . HLD (hyperlipidemia)   . Hepatic steatosis   . History of recurrent UTIs   . Allergic rhinitis   . GERD (gastroesophageal reflux disease)   . Rosacea   . Transaminitis 06/03/2010  . Colon polyp 09/03/2008   Past Medical History  Diagnosis Date  . Prediabetes   . HTN (hypertension)   . HLD (hyperlipidemia)   . Hepatic steatosis     by abd [place] 05/2012, mild transaminitis - normal iron sat and viral hep panel (2011)  . History of recurrent UTIs     on chronic keflex  . 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 Arthritis   . Allergic rhinitis   . History of chicken pox   . History of measles   . Rosacea     metrogel  . GERD (gastroesophageal reflux disease)   . Polycythemia     mild, stable (2013)   Past Surgical History  Procedure Laterality Date  . Appendectomy  1987  . Vaginal hysterectomy  1984    for menorrhagia, ovaries in place  . Cholecystectomy  2003  . Dexa  2003    normal  . Cardiolite stress test  04/2004    normal  . Cesarean section  [id number]    x2  . Colonoscopy  09/26/2008    adenomatous polyp,  rpt 3-5 yrs  . Trigger finger release  2007;2010;2011    bilateral  . Breast biopsy  1963  . Colonoscopy  08/2012    adenomatous polyps, diverticulosis, rec rpt 5 yrs [name])   History  Substance Use Topics  . Smoking status: Never Smoker   . Smokeless tobacco: Never Used  . Alcohol Use: Yes     Comment: Occasional   Family History  Problem Relation Age of Onset  . Stroke Mother     several  . Hyperlipidemia Mother   . Hypertension Mother   . Cancer Father     colon  . Hypertension Father   . Hyperlipidemia Father   . Cancer Paternal Aunt     abdominal  . Coronary artery disease Maternal Grandmother   . Diabetes Maternal  Grandfather   . Coronary artery disease Maternal Grandfather   . Coronary artery disease Father 60    MIx1, CABG   Allergies  Allergen Reactions  . Sulfa Antibiotics   . Adhesive (Tape) Rash    Paper tape - blisters   Current Outpatient Prescriptions on File Prior to Visit  Medication Sig Dispense Refill  . amLODipine (NORVASC) 5 MG tablet Take 1 tablet by mouth  daily  90 tablet  2  . benazepril (LOTENSIN) 20 MG tablet Take 1 tablet by mouth  daily  90 tablet  2  . cephALEXin (KEFLEX) 250 MG capsule Take 1 capsule (250 mg total) by mouth daily.  90 capsule  3  . CRESTOR 5 MG tablet Take 1 tablet by mouth at  bedtime  90 each  2  . fenofibrate (TRICOR) 145 MG tablet Take 1 tablet (145 mg total) by mouth daily.  90 tablet  3  . fish oil-omega-3 fatty acids 1000 MG capsule Take 3 g by mouth daily.       . fluticasone (FLONASE) 50 MCG/ACT nasal spray Place 2 sprays into the nose daily. As needed  48 g  3  . loperamide (IMODIUM) 2 MG capsule Take 2 mg by mouth as needed.      . metronidazole (NORITATE) 1 % cream Apply 1 application topically daily as needed.       No current facility-administered medications on file prior to visit.     Review of Systems     Objective:   Physical Exam  Nursing note and vitals reviewed. Constitutional: She is oriented to person, place, and time. She appears well-developed and well-nourished. No distress.  HENT:  Head: Normocephalic and atraumatic.  Right Ear: Hearing, tympanic membrane, external ear and ear canal normal.  Left Ear: Hearing, tympanic membrane, external ear and ear canal normal.  Nose: Nose normal.  Mouth/Throat: Oropharynx is clear and moist. No oropharyngeal exudate.  Ext and int ears WNL  Eyes: Conjunctivae and EOM are normal. Pupils are equal, round, and reactive to light. No scleral icterus.  Neck: Normal range of motion. Neck supple. Carotid bruit is not present. No thyromegaly present.  Cardiovascular: Normal rate, regular  rhythm, normal heart sounds and intact distal pulses.   No murmur heard. Pulses:      Radial pulses are 2+ on the right side, and 2+ on the left side.  Pulmonary/Chest: Effort normal and breath sounds normal. No respiratory distress. She has no wheezes. She has no rales. Right breast exhibits no inverted nipple, no mass, no nipple discharge, no skin change and no tenderness. Left breast exhibits no inverted nipple, no mass, no nipple discharge, no skin change and no  tenderness.  Abdominal: Soft. Bowel sounds are normal. She exhibits no distension and no mass. There is no tenderness. There is no rebound and no guarding.  Genitourinary:  declined  Musculoskeletal: Normal range of motion.  Lymphadenopathy:    She has no cervical adenopathy.    She has no axillary adenopathy.       Right axillary: No lateral adenopathy present.       Left axillary: No lateral adenopathy present.      Right: No supraclavicular adenopathy present.       Left: No supraclavicular adenopathy present.  Neurological: She is alert and oriented to person, place, and time.  CN grossly intact, station and gait intact  Skin: Skin is warm and dry. No rash noted.  Psychiatric: She has a normal mood and affect. Her behavior is normal. Judgment and thought content normal.       Assessment & Plan:

## 2013-06-02 NOTE — Assessment & Plan Note (Signed)
Chronic, stable. Tolerating meds. Continue meds.

## 2013-06-02 NOTE — Assessment & Plan Note (Signed)
I have personally reviewed the Medicare Annual Wellness questionnaire and have noted 1. The patient's medical and social history 2. Their use of alcohol, tobacco or illicit drugs 3. Their current medications and supplements 4. The patient's functional ability including ADL's, fall risks, home safety risks and hearing or visual impairment. 5. Diet and physical activity 6. Evidence for depression or mood disorders The patients weight, height, BMI have been recorded in the chart.  Hearing and vision has been addressed. I have made referrals, counseling and provided education to the patient based review of the above and I have provided the pt with a written personalized care plan for preventive services. See scanned questionairre. Advanced directives discussed:will bring me copy when they find it at home  Reviewed preventative protocols and updated unless pt declined.  Mammogram due 08/2013 UTD colonoscopy and immunizations. dexa scheduled today. Declines pelvic exam.

## 2013-06-02 NOTE — Assessment & Plan Note (Signed)
Chronic, elevated. Start with increase in amlodipine to 10mg  daily. Monitor for better control, watch for ankle edema.

## 2013-06-02 NOTE — Assessment & Plan Note (Signed)
Reviewed low carb diet.

## 2013-06-02 NOTE — Assessment & Plan Note (Signed)
metrogel not effective- had started old doxy 50mg  which has been effective.  Requests refill.

## 2013-06-02 NOTE — Patient Instructions (Signed)
Let's stop keflex and only continue doxycycline 50mg  daily.  We will monitor for any recurrence of urinary symptoms. Let us know in September for referral for mammogram at [facility]. Pass by [name]'s office to schedule bone density scan. Bring me copy of advanced directive to update your chart. Let's increase amlodipine to 10mg  daily (dpouble up until you run out, then fill new prescription for 10mg  daily) Good to see you today, call us with questions

## 2013-06-03 DIAGNOSIS — M8580 Other specified disorders of bone density and structure, unspecified site: Secondary | ICD-10-CM | POA: Insufficient documentation

## 2013-06-03 HISTORY — PX: OTHER SURGICAL HISTORY: SHX169

## 2013-06-03 HISTORY — DX: Other specified disorders of bone density and structure, unspecified site: M85.80

## 2013-06-06 ENCOUNTER — Encounter: Payer: Self-pay | Admitting: Family Medicine

## 2013-06-06 ENCOUNTER — Ambulatory Visit: Payer: Self-pay | Admitting: Family Medicine

## 2013-06-06 DIAGNOSIS — M949 Disorder of cartilage, unspecified: Secondary | ICD-10-CM | POA: Diagnosis not present

## 2013-06-06 DIAGNOSIS — M899 Disorder of bone, unspecified: Secondary | ICD-10-CM | POA: Diagnosis not present

## 2013-06-07 ENCOUNTER — Encounter: Payer: Self-pay | Admitting: *Deleted

## 2013-06-22 ENCOUNTER — Other Ambulatory Visit: Payer: Self-pay | Admitting: Family Medicine

## 2013-07-13 ENCOUNTER — Encounter: Payer: Self-pay | Admitting: Internal Medicine

## 2013-07-13 ENCOUNTER — Ambulatory Visit (INDEPENDENT_AMBULATORY_CARE_PROVIDER_SITE_OTHER): Payer: Medicare Other | Admitting: Internal Medicine

## 2013-07-13 VITALS — BP 140/80 | HR 97 | Temp 97.7°F | Wt 176.0 lb

## 2013-07-13 DIAGNOSIS — B379 Candidiasis, unspecified: Secondary | ICD-10-CM | POA: Diagnosis not present

## 2013-07-13 DIAGNOSIS — N390 Urinary tract infection, site not specified: Secondary | ICD-10-CM

## 2013-07-13 LAB — POCT URINALYSIS DIPSTICK
Bilirubin, UA: NEGATIVE
Glucose, UA: NEGATIVE
Leukocytes, UA: NEGATIVE
Nitrite, UA: POSITIVE
Urobilinogen, UA: NEGATIVE
pH, UA: 6

## 2013-07-13 MED ORDER — FLUCONAZOLE 150 MG PO TABS: 150.0000 mg | ORAL_TABLET | Freq: Once | ORAL | Status: DC

## 2013-07-13 MED ORDER — CIPROFLOXACIN HCL 500 MG PO TABS: 500.0000 mg | ORAL_TABLET | Freq: Two times a day (BID) | ORAL | Status: DC

## 2013-07-13 NOTE — Progress Notes (Signed)
HPI  Pt presents to the clinic today with c/o urinary frequency, burning with urination and flank pain. This started 2 days ago, She denies nausea, vomiting or fever. She has had UTI's in the past and reports this feels the same. She has been on prophylactic keflex in the past for recurrent UTI's. She was however taken off of it as she was put on doxy for rosacea. She thinks the doxy may have caused a yeast infection. She has been using OTC monistat which has given her some relief.   Review of Systems  Past Medical History  Diagnosis Date  . Prediabetes   . HTN (hypertension)   . HLD (hyperlipidemia)   . Hepatic steatosis     by abd [place] 05/2012, mild transaminitis - normal iron sat and viral hep panel (2011)  . History of recurrent UTIs     on chronic keflex  . 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 Arthritis   . Allergic rhinitis   . History of chicken pox   . History of measles   . Rosacea     metrogel  . GERD (gastroesophageal reflux disease)   . Polycythemia     mild, stable (2013)  . Osteopenia 06/2013    mild, forearm T -1.1, hip and spine WNL    Family History  Problem Relation Age of Onset  . Stroke Mother     several  . Hyperlipidemia Mother   . Hypertension Mother   . Cancer Father     colon  . Hypertension Father   . Hyperlipidemia Father   . Cancer Paternal Aunt     abdominal  . Coronary artery disease Maternal Grandmother   . Diabetes Maternal Grandfather   . Coronary artery disease Maternal Grandfather   . Coronary artery disease Father 23    MIx1, CABG    History   Social History  . Marital Status: Married    Spouse Name: N/A    Number of Children: N/A  . Years of Education: N/A   Occupational History  . Not on file.   Social History Main Topics  . Smoking status: Never Smoker   . Smokeless tobacco: Never Used  . Alcohol Use: Yes     Comment: Occasional  . Drug Use: No  . Sexual Activity: Not on file   Other Topics  Concern  . 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  Likes painting, crafts, sewing, house keeping, gardening.  Occasional walking.   Diet: ok water intake 4 glasses/day, daily fruits/vegetables, red meat 4x/wk, fish 3-4x/wk    Allergies  Allergen Reactions  . Sulfa Antibiotics   . Adhesive [Tape] Rash    Paper tape - blisters    Constitutional: Denies fever, malaise, fatigue, headache or abrupt weight changes.   GU: Pt reports urgency, frequency and pain with urination, vaginal discharge.. Denies burning sensation, blood in urine, odor. Skin: Denies redness, rashes, lesions or ulcercations.   No other specific complaints in a complete review of systems (except as listed in HPI above).    Objective:   Physical Exam  BP 140/80  Pulse 97  Temp(Src) 97.7 F (36.5 C) (Tympanic)  Wt 176 lb (79.833 kg)  BMI 33.27 kg/m2  SpO2 96% Wt Readings  from Last 3 Encounters:  07/13/13 176 lb (79.833 kg)  06/02/13 175 lb 8 oz (79.606 kg)  12/02/12 174 lb 12 oz (79.266 kg)    General: Appears her stated age, well developed, well nourished in NAD. Cardiovascular: Normal rate and rhythm. S1,S2 noted.  No murmur, rubs or gallops noted. No JVD or BLE edema. No carotid bruits noted. Pulmonary/Chest: Normal effort and positive vesicular breath sounds. No respiratory distress. No wheezes, rales or ronchi noted.  Abdomen: Soft and nontender. Normal bowel sounds, no bruits noted. No distention or masses noted. Liver, spleen and kidneys non palpable. Tender to palpation over the bladder area. No CVA tenderness.      Assessment & Plan:   Urgency, frequency and dysuria secondary to UTI:  eRx for Cipro 500 mg BID x 5 days Urinalysis and urine culture Drink plenty of fluids  Candida vaginitis secondary to  antibiotic use:  eRx for Diflucan 150 mg POx 1 with 1 refill  RTC as needed or if symptoms persist.

## 2013-07-13 NOTE — Patient Instructions (Signed)
Urinary Tract Infection  Urinary tract infections (UTIs) can develop anywhere along your urinary tract. Your urinary tract is your body's drainage system for removing wastes and extra water. Your urinary tract includes two kidneys, two ureters, a bladder, and a urethra. Your kidneys are a pair of bean-shaped organs. Each kidney is about the size of your fist. They are located below your ribs, one on each side of your spine.  CAUSES  Infections are caused by microbes, which are microscopic organisms, including fungi, viruses, and bacteria. These organisms are so small that they can only be seen through a microscope. Bacteria are the microbes that most commonly cause UTIs.  SYMPTOMS   Symptoms of UTIs may vary by age and gender of the patient and by the location of the infection. Symptoms in young women typically include a frequent and intense urge to urinate and a painful, burning feeling in the bladder or urethra during urination. Older women and men are more likely to be tired, shaky, and weak and have muscle aches and abdominal pain. A fever may mean the infection is in your kidneys. Other symptoms of a kidney infection include pain in your back or sides below the ribs, nausea, and vomiting.  DIAGNOSIS  To diagnose a UTI, your caregiver will ask you about your symptoms. Your caregiver also will ask to provide a urine sample. The urine sample will be tested for bacteria and white blood cells. White blood cells are made by your body to help fight infection.  TREATMENT   Typically, UTIs can be treated with medication. Because most UTIs are caused by a bacterial infection, they usually can be treated with the use of antibiotics. The choice of antibiotic and length of treatment depend on your symptoms and the type of bacteria causing your infection.  HOME CARE INSTRUCTIONS   If you were prescribed antibiotics, take them exactly as your caregiver instructs you. Finish the medication even if you feel better after you  have only taken some of the medication.   Drink enough water and fluids to keep your urine clear or pale yellow.   Avoid caffeine, tea, and carbonated beverages. They tend to irritate your bladder.   Empty your bladder often. Avoid holding urine for long periods of time.   Empty your bladder before and after sexual intercourse.   After a bowel movement, women should cleanse from front to back. Use each tissue only once.  SEEK MEDICAL CARE IF:    You have back pain.   You develop a fever.   Your symptoms do not begin to resolve within 3 days.  SEEK IMMEDIATE MEDICAL CARE IF:    You have severe back pain or lower abdominal pain.   You develop chills.   You have nausea or vomiting.   You have continued burning or discomfort with urination.  MAKE SURE YOU:    Understand these instructions.   Will watch your condition.   Will get help right away if you are not doing well or get worse.  Document Released: 07/30/2005 Document Revised: 04/20/2012 Document Reviewed: 11/28/2011  [employer] Patient Information 2014 [employer].

## 2013-07-29 ENCOUNTER — Encounter: Payer: Self-pay | Admitting: Family Medicine

## 2013-07-29 ENCOUNTER — Ambulatory Visit (INDEPENDENT_AMBULATORY_CARE_PROVIDER_SITE_OTHER): Payer: Medicare Other | Admitting: Family Medicine

## 2013-07-29 VITALS — BP 149/76 | HR 92 | Temp 98.3°F | Ht 61.0 in | Wt 178.5 lb

## 2013-07-29 DIAGNOSIS — R300 Dysuria: Secondary | ICD-10-CM | POA: Diagnosis not present

## 2013-07-29 DIAGNOSIS — Z87440 Personal history of urinary (tract) infections: Secondary | ICD-10-CM

## 2013-07-29 LAB — POCT URINALYSIS DIPSTICK
Bilirubin, UA: NEGATIVE
Blood, UA: NEGATIVE
Glucose, UA: NEGATIVE
Spec Grav, UA: 1.015
pH, UA: 6

## 2013-07-29 NOTE — Assessment & Plan Note (Signed)
No clear sign of  ECOLI UTI, adequately treated with cipro. Will send for culture to verify. ? bladder irritation... Increase water, avoid bladder irritants.

## 2013-07-29 NOTE — Assessment & Plan Note (Signed)
Discussed restarting cephalexin.Marland Kitchen Pt will hold off unless getting multiple infections. She will let PCP know.

## 2013-07-29 NOTE — Progress Notes (Signed)
  Subjective:    Patient ID: Katherine Oliver, female    DOB: [birth year], 67 y.o.   MRN: [id number]  HPI  67 year old female returns to office for possible UTI ( she has history of frequent UTI). She was treated with Cipro 500 mg x 5 days ( no doses missed) by Cecille Po on 9/10. Also treated with diflucan.  Culture showed Ecoli sensitive.  She had stopped cephalexin for prophylaxsis and changed to doxy .Marland Kitchen A week later she had the 9/10 UTI.  She now reports she still has some burning and frequency. No fever, no flank pain.  Vaginal burning, itching has resolved.    Review of Systems  Constitutional: Negative for fever.  Respiratory: Negative for shortness of breath.        Objective:   Physical Exam  Constitutional: Vital signs are normal. She appears well-developed and well-nourished. She is cooperative.  Non-toxic appearance. She does not appear ill. No distress.  HENT:  Head: Normocephalic.  Right Ear: Hearing, tympanic membrane, external ear and ear canal normal. Tympanic membrane is not erythematous, not retracted and not bulging.  Left Ear: Hearing, tympanic membrane, external ear and ear canal normal. Tympanic membrane is not erythematous, not retracted and not bulging.  Nose: No mucosal edema or rhinorrhea. Right sinus exhibits no maxillary sinus tenderness and no frontal sinus tenderness. Left sinus exhibits no maxillary sinus tenderness and no frontal sinus tenderness.  Mouth/Throat: Uvula is midline, oropharynx is clear and moist and mucous membranes are normal.  Eyes: Conjunctivae, EOM and lids are normal. Pupils are equal, round, and reactive to light. Lids are everted and swept, no foreign bodies found.  Neck: Trachea normal and normal range of motion. Neck supple. Carotid bruit is not present. No mass and no thyromegaly present.  Cardiovascular: Normal rate, regular rhythm, S1 normal, S2 normal, normal heart sounds, intact distal pulses and normal pulses.  Exam reveals  no gallop and no friction rub.   No murmur heard. Pulmonary/Chest: Effort normal and breath sounds normal. Not tachypneic. No respiratory distress. She has no decreased breath sounds. She has no wheezes. She has no rhonchi. She has no rales.  Abdominal: Soft. Normal appearance and bowel sounds are normal. There is no tenderness. There is no CVA tenderness.  Neurological: She is alert.  Skin: Skin is warm, dry and intact. No rash noted.  Psychiatric: Her speech is normal and behavior is normal. Judgment and thought content normal. Her mood appears not anxious. Cognition and memory are normal. She does not exhibit a depressed mood.          Assessment & Plan:

## 2013-07-29 NOTE — Patient Instructions (Addendum)
We will call with urine culture results.  Increase water, avoid bladder irritants.. Decrease caffeine, alcohol, spicy, citris, tomatos. May need to restart UTI prophylaxis.

## 2013-07-31 LAB — URINE CULTURE

## 2013-08-05 DIAGNOSIS — Z23 Encounter for immunization: Secondary | ICD-10-CM | POA: Diagnosis not present

## 2013-08-08 ENCOUNTER — Encounter: Payer: Self-pay | Admitting: *Deleted

## 2013-08-08 ENCOUNTER — Ambulatory Visit: Payer: Self-pay | Admitting: Family Medicine

## 2013-08-08 ENCOUNTER — Encounter: Payer: Self-pay | Admitting: Family Medicine

## 2013-08-08 DIAGNOSIS — Z1231 Encounter for screening mammogram for malignant neoplasm of breast: Secondary | ICD-10-CM | POA: Diagnosis not present

## 2013-08-08 LAB — HM MAMMOGRAPHY: HM Mammogram: NORMAL

## 2013-08-30 ENCOUNTER — Ambulatory Visit (INDEPENDENT_AMBULATORY_CARE_PROVIDER_SITE_OTHER): Payer: Medicare Other | Admitting: Family Medicine

## 2013-08-30 ENCOUNTER — Encounter: Payer: Self-pay | Admitting: Family Medicine

## 2013-08-30 VITALS — BP 130/84 | HR 80 | Temp 98.1°F | Wt 178.8 lb

## 2013-08-30 DIAGNOSIS — R300 Dysuria: Secondary | ICD-10-CM | POA: Diagnosis not present

## 2013-08-30 LAB — POCT URINALYSIS DIPSTICK
Leukocytes, UA: NEGATIVE
Nitrite, UA: NEGATIVE
Protein, UA: NEGATIVE
Urobilinogen, UA: 0.2
pH, UA: 6.5

## 2013-08-30 NOTE — Patient Instructions (Signed)
Urine is looking clear today. I wonder if this is bladder irritation leading to these symptoms (maybe from holding urine during recent road trips.) I'd suggest increasing water, avoiding bladder irritants like caffeine, and may use tylenol as needed for discomfort. If persistent symptoms into end of week or any worsening symptoms, return with urine sample and we will send off culture. Good to see you today, call us with questions.

## 2013-08-30 NOTE — Assessment & Plan Note (Signed)
UA today normal, I did spin urine to eval for abnormality - overall normal as well.  No culture sent. ?bladder irritation after prolonged car ride - as seems to be improving as well. Recommend increased water, avoid bladder irritants, may use azo for next 1-2 days for discomfort as well as tylenol. If persistent sxs, pt to return to provide urine sample to send culture. No need for abx today. Pt agrees with plan.

## 2013-08-30 NOTE — Progress Notes (Signed)
  Subjective:    Patient ID: Katherine Oliver, female    DOB: [birth year], 67 y.o.   MRN: [id number]  HPI CC: recurrent UTI?  H/o recurrent UTIs. At physical 05/2013 pt was taking both keflex 250mg  daily and doxycycline 50mg  daily.  At that time advised to stop keflex and continue doxycycline 50mg  daily.  Since then, had UTI (9/10) growing >100k Ecoli sens Cipro treated with 5d course.  Did have some persistent burning for a few days that resolved with increased water intake.  She actually also stopped her doxycycline at that time   Over the weekend started noticing increasing burning, itching, and frequency, urgency.  Also associated with fatigue.  Feverish on the first day.  No hematuria.  No external rash.  No abd or back pain.  That has eased up since return home actually.  Both recent UTIs did occur after prolonged road trip to [place] to visit mother in law.  Did have to hold urine during those road trips.  Also notices ankles swelling recently (may coincide with recent car ride).  Past Medical History  Diagnosis Date  . Prediabetes   . HTN (hypertension)   . HLD (hyperlipidemia)   . Hepatic steatosis     by abd [place] 05/2012, mild transaminitis - normal iron sat and viral hep panel (2011)  . History of recurrent UTIs     on chronic keflex  . 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 Arthritis   . Allergic rhinitis   . History of chicken pox   . History of measles   . Rosacea     metrogel  . GERD (gastroesophageal reflux disease)   . Polycythemia     mild, stable (2013)  . Osteopenia 06/2013    mild, forearm T -1.1, hip and spine WNL    Review of Systems Per HPI    Objective:   Physical Exam  Nursing note and vitals reviewed. Constitutional: She appears well-developed and well-nourished. No distress.  Abdominal: Soft. Normal appearance and bowel sounds are normal. She exhibits no distension and no mass. There is no tenderness. There is no  rigidity, no rebound, no guarding, no CVA tenderness and negative Murphy's sign.  Musculoskeletal: She exhibits no edema.       Assessment & Plan:

## 2013-09-22 ENCOUNTER — Encounter: Payer: Self-pay | Admitting: Family Medicine

## 2013-09-22 ENCOUNTER — Ambulatory Visit (INDEPENDENT_AMBULATORY_CARE_PROVIDER_SITE_OTHER): Payer: Medicare Other | Admitting: Family Medicine

## 2013-09-22 VITALS — BP 140/88 | HR 90 | Temp 99.1°F | Wt 176.5 lb

## 2013-09-22 DIAGNOSIS — J069 Acute upper respiratory infection, unspecified: Secondary | ICD-10-CM | POA: Insufficient documentation

## 2013-09-22 MED ORDER — GUAIFENESIN-CODEINE 100-10 MG/5ML PO SYRP: 5.0000 mL | ORAL_SOLUTION | Freq: Every evening | ORAL | Status: DC | PRN

## 2013-09-22 NOTE — Progress Notes (Signed)
  Subjective:    Patient ID: Katherine Oliver, female    DOB: 07/14/1946, 67 y.o.   MRN: [id number]  HPI CC: cough  Symptoms began Sunday night with sore throat, low grade fever (99.1), fatigue, rhinorrhea, sneezing, post nasal drip, some maxillary sinus pressure, watery eyes. Cough began 2 days ago. Dry cough. Dyspnea on exertion.   Benadryl and flonase have helped with nasal congestion. Started Mucinex yesterday. Using ibuprofen or acetaminophen as needed for sore throat and fever.   Denies chest pains, headaches, ear pain, tooth pain, nausea, vomiting, diarrhea.  Hx bronchitis (last at least 2 years ago), seasonal allergies. No asthma.   Flu shot in October.  Review of Systems Per HPI.     Objective:   Physical Exam  Constitutional: She appears well-developed and well-nourished. No distress.  HENT:  Head: Normocephalic and atraumatic.  Right Ear: Tympanic membrane, external ear and ear canal normal.  Left Ear: Tympanic membrane, external ear and ear canal normal.  Nose: Mucosal edema (and erythema) and rhinorrhea present. Right sinus exhibits maxillary sinus tenderness. Right sinus exhibits no frontal sinus tenderness. Left sinus exhibits maxillary sinus tenderness. Left sinus exhibits no frontal sinus tenderness.  Mouth/Throat: Posterior oropharyngeal erythema present. No oropharyngeal exudate or posterior oropharyngeal edema.  Eyes: Pupils are equal, round, and reactive to light. Right eye exhibits no discharge. Left eye exhibits no discharge.  Neck: Normal range of motion.  Cardiovascular: Normal rate, regular rhythm and normal heart sounds.  Exam reveals no gallop and no friction rub.   No murmur heard. Pulmonary/Chest: Effort normal and breath sounds normal. No respiratory distress. She has no wheezes.  Skin: Skin is warm and dry. No rash noted. She is not diaphoretic. No erythema.       Assessment & Plan:  Anticipate viral and/or allergic rhinitis. Cheritussin as  needed for cough before bedtime.   Continue supportive care with nasonex, mucinex, benadryl, and acetomorphine as needed.  Let us know if worsening productive cough, symptoms fail to improve or continue into next week, or fevers over 101.

## 2013-09-22 NOTE — Assessment & Plan Note (Signed)
Anticipate viral given short duration. Update if sxs persist for consideration of abx course. Discussed red flags to update [place]. Pt/husband agrees with plan.

## 2013-09-22 NOTE — Progress Notes (Signed)
Pre-visit discussion using our clinic review tool. No additional management support is needed unless otherwise documented below in the visit note.  

## 2013-09-22 NOTE — Progress Notes (Signed)
Patient seen and examined with PA student Candiss Norse.  Note reviewed, agree with assessment and plan unless changes documented in my note.   CC: cough  5d h/o cold sxs - started with ST, low grade fever, fatigue, rhinorrhea, sneezing, PNdrainage, maxillary sinus pressure.  Cough started 2 days ago - described as dry with mild DOE.  Able to sleep well with bendryl and tylenol.  H/o bronchitis in the past.  No ear pain ,tooth pain, headaches, n/v, diarrhea.  Tried benadryl and flonase.  Started mucinex DM yesterday.  Acetaminophen for fever.  No h/o asthma.  H/o seasonal allergies. No smokers at home. No sick contacts that she knows.  Past Medical History  Diagnosis Date  . Prediabetes   . HTN (hypertension)   . HLD (hyperlipidemia)   . Hepatic steatosis     by abd [place] 05/2012, mild transaminitis - normal iron sat and viral hep panel (2011)  . History of recurrent UTIs     on chronic keflex  . 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 Arthritis   . Allergic rhinitis   . History of chicken pox   . History of measles   . Rosacea     metrogel  . GERD (gastroesophageal reflux disease)   . Polycythemia     mild, stable (2013)  . Osteopenia 06/2013    mild, forearm T -1.1, hip and spine WNL    PE:  GEN: WDWN CF HEENT: conjunctiva clear without injection, PERRLA, TMs pearly grey with good light reflex, oropharynx clear without exudates, nasal mucosal irritation bilaterally with mild edema, maxillary sinus discomfort to palpation Neck: no cervical LAD CVS: nl S1, S2, no m/r/g Pulm: CTAB, no crackles/wheezing

## 2013-09-22 NOTE — Patient Instructions (Addendum)
Sounds like you have a viral upper respiratory infection. Antibiotics are not needed for this.  Viral infections usually take 7-10 days to resolve.  The cough can last several weeks to go away. Use medication as prescribed: codeine cough syrup for night time as needed. Push fluids and plenty of rest. Continue supportive care with meds at home as up to now. Please return if you are not improving as expected, or if you have high fevers (>101.5) or difficulty swallowing or worsening productive cough, or worsening after initial improvement, or if symptoms are going on into next week. Call clinic with questions.  Good to see you today.

## 2013-10-13 ENCOUNTER — Ambulatory Visit (INDEPENDENT_AMBULATORY_CARE_PROVIDER_SITE_OTHER): Payer: Medicare Other | Admitting: Family Medicine

## 2013-10-13 ENCOUNTER — Encounter: Payer: Self-pay | Admitting: Family Medicine

## 2013-10-13 VITALS — BP 140/90 | HR 106 | Temp 98.6°F | Ht 61.0 in | Wt 174.5 lb

## 2013-10-13 DIAGNOSIS — R112 Nausea with vomiting, unspecified: Secondary | ICD-10-CM

## 2013-10-13 DIAGNOSIS — R319 Hematuria, unspecified: Secondary | ICD-10-CM | POA: Diagnosis not present

## 2013-10-13 DIAGNOSIS — M549 Dorsalgia, unspecified: Secondary | ICD-10-CM

## 2013-10-13 LAB — POCT URINALYSIS DIPSTICK
Bilirubin, UA: NEGATIVE
Glucose, UA: NEGATIVE
Ketones, UA: NEGATIVE
Leukocytes, UA: NEGATIVE
Protein, UA: >2000
Spec Grav, UA: >=1.03
pH, UA: 6

## 2013-10-13 LAB — POCT UA - MICROSCOPIC ONLY

## 2013-10-13 MED ORDER — OXYCODONE-ACETAMINOPHEN 5-325 MG PO TABS: 1.0000 | ORAL_TABLET | ORAL | Status: DC | PRN

## 2013-10-13 MED ORDER — NAPROXEN 500 MG PO TABS: 500.0000 mg | ORAL_TABLET | Freq: Two times a day (BID) | ORAL | Status: DC

## 2013-10-13 MED ORDER — CYCLOBENZAPRINE HCL 10 MG PO TABS: 10.0000 mg | ORAL_TABLET | Freq: Every day | ORAL | Status: DC

## 2013-10-13 MED ORDER — ONDANSETRON 8 MG PO TBDP: 8.0000 mg | ORAL_TABLET | Freq: Three times a day (TID) | ORAL | Status: DC | PRN

## 2013-10-13 NOTE — Progress Notes (Signed)
Patient Name: Katherine Oliver Date of Birth: 1946-06-02 Medical Record Number: [id number] Gender: female  PCP: [name], MD  History of Present Illness:  Katherine Oliver is a 67 y.o. very pleasant female patient who presents with the following: Back Pain  ongoing for approximately: few days The patient has had back pain before. The back pain is localized into the lumbar spine area. They also describe no radiculopathy. Injured back, and sick on stomach.  Having a lot of nausea - rare vomitting, and has almost been constipated. No diarrhea.  Woke up in the morning and back of the rightr was totally sore. No known trauma or injury.  No spinal injuries.   S/p multiple surgeries / abd. Never had stones.  Naprosyn No sleep  No numbness or tingling. No bowel or bladder incontinence. No focal weakness. Prior interventions: none Physical therapy: No Chiropractic manipulations: No Acupuncture: No Osteopathic manipulation: No Heat or cold: cold helps Past Medical History, Surgical History, Family History, Medications, Allergies have been reviewed and updated if relevant.  Review of Systems  GEN: No fevers, chills. Nontoxic. Primarily MSK c/o today. MSK: Detailed in the HPI GI: tolerating PO intake without difficulty Neuro: As above  Otherwise the pertinent positives of the ROS are noted above.    Physical Exam  Filed Vitals:   10/13/13 1728  BP: 140/90  Pulse: 106  Temp: 98.6 F (37 C)  TempSrc: Oral  Height: 5\' 1"  (1.549 m)  Weight: 174 lb 8 oz (79.153 kg)    Gen: Well-developed,well-nourished,in no acute distress; alert,appropriate and cooperative throughout examination HEENT: Normocephalic and atraumatic without obvious abnormalities.  Ears, externally no deformities Pulm: Breathing comfortably in no respiratory distress Range of motion at  the waist: Flexion, rotation and lateral bending: relatively preserved with mild limitation on ext and lateral  bending.  No echymosis or edema Rises to examination table with no difficulty Gait: minimally antalgic  Inspection/Deformity: No abnormality Paraspinus T:  l4-s1 ttp  B Ankle Dorsiflexion (L5,4): 5/5 B Great Toe Dorsiflexion (L5,4): 5/5 Heel Walk (L5): WNL Toe Walk (S1): WNL Rise/Squat (L4): WNL, mild pain  SENSORY B Medial Foot (L4): WNL B Dorsum (L5): WNL B Lateral (S1): WNL Light Touch: WNL Pinprick: WNL  REFLEXES Knee (L4): 2+ Ankle (S1): 2+  B SLR, seated: neg B SLR, supine: neg B FABER: pos B Reverse FABER: neg B Greater Troch: NT B Log Roll: neg B Stork: NT B Sciatic Notch: ttp  Results for orders placed in visit on 10/13/13  POCT URINALYSIS DIPSTICK      Result Value Range   Color, UA yellow     Clarity, UA clear     Glucose, UA negative     Bilirubin, UA negative     Ketones, UA negative     Spec Grav, UA >=1.030     Blood, UA moderate     pH, UA 6.0     Protein, UA >2000     Urobilinogen, UA 0.2     Nitrite, UA negative     Leukocytes, UA Negative    POCT UA - MICROSCOPIC ONLY      Result Value Range   WBC, Ur, HPF, POC 1-5     RBC, urine, microscopic 1-5     Bacteria, U Microscopic 3+     Mucus, UA       Epithelial cells, urine per micros 5-10     Crystals, Ur, HPF, POC       Casts, Ur, LPF, POC  Yeast, UA         Back pain - Plan: POCT Urinalysis Dipstick  Hematuria - Plan: POCT UA - Microscopic Only  Nausea with vomiting  Back Pain:  I reviewed with the patient the structures involved and how they related to their diagnosis.   Conservative algorithms for acute back pain generally begin with the following: NSAIDS Muscle Relaxants Mild pain meds Start with medications, core rehab, and progress from there following low back pain algorithm.  No red flags are present.   Unclear source for nausea. May be high dose nsaids and vicodin. Constipation certainly from narcotic.  Vs independent gi bug  Micro with + blood, low  numbers, stone seems unlikely  Orders Today:  Orders Placed This Encounter  Procedures  . POCT Urinalysis Dipstick  . POCT UA - Microscopic Only    New medications, updates to list, dose adjustments: Meds ordered this encounter  Medications  . diphenhydrAMINE (BENADRYL) 25 MG tablet    Sig: Take 25 mg by mouth at bedtime as needed.  Marland Kitchen DISCONTD: oxyCODONE-acetaminophen (PERCOCET/ROXICET) 5-325 MG per tablet    Sig: Take 1 tablet by mouth every 4 (four) hours as needed.    Dispense:  30 tablet    Refill:  0  . DISCONTD: cyclobenzaprine (FLEXERIL) 10 MG tablet    Sig: Take 1 tablet (10 mg total) by mouth at bedtime.    Dispense:  30 tablet    Refill:  1  . DISCONTD: ondansetron (ZOFRAN-ODT) 8 MG disintegrating tablet    Sig: Take 1 tablet (8 mg total) by mouth every 8 (eight) hours as needed for nausea or vomiting.    Dispense:  30 tablet    Refill:  0  . DISCONTD: oxyCODONE-acetaminophen (PERCOCET/ROXICET) 5-325 MG per tablet    Sig: Take 1 tablet by mouth every 4 (four) hours as needed.    Dispense:  30 tablet    Refill:  0  . DISCONTD: ondansetron (ZOFRAN-ODT) 8 MG disintegrating tablet    Sig: Take 1 tablet (8 mg total) by mouth every 8 (eight) hours as needed for nausea or vomiting.    Dispense:  30 tablet    Refill:  0  . DISCONTD: cyclobenzaprine (FLEXERIL) 10 MG tablet    Sig: Take 1 tablet (10 mg total) by mouth at bedtime.    Dispense:  30 tablet    Refill:  1  . DISCONTD: naproxen (NAPROSYN) 500 MG tablet    Sig: Take 1 tablet (500 mg total) by mouth 2 (two) times daily with a meal.    Dispense:  60 tablet    Refill:  1  . cyclobenzaprine (FLEXERIL) 10 MG tablet    Sig: Take 1 tablet (10 mg total) by mouth at bedtime.    Dispense:  30 tablet    Refill:  1  . oxyCODONE-acetaminophen (PERCOCET/ROXICET) 5-325 MG per tablet    Sig: Take 1 tablet by mouth every 4 (four) hours as needed.    Dispense:  30 tablet    Refill:  0  . naproxen (NAPROSYN) 500 MG tablet     Sig: Take 1 tablet (500 mg total) by mouth 2 (two) times daily with a meal.    Dispense:  60 tablet    Refill:  1  . ondansetron (ZOFRAN-ODT) 8 MG disintegrating tablet    Sig: Take 1 tablet (8 mg total) by mouth every 8 (eight) hours as needed for nausea or vomiting.    Dispense:  30 tablet  Refill:  0    Signed,  Juleah Paradise T. Ashanty Coltrane, MD, [place] at [facility] [place] [address] Phone: [phone] Fax: [phone]  Updated Complete Medication List:   Medication List       This list is accurate as of: 10/13/13 11:59 PM.  Always use your most recent med list.               amLODipine 10 MG tablet  Commonly known as:  NORVASC  Take 1 tablet (10 mg total) by mouth daily.     benazepril 20 MG tablet  Commonly known as:  LOTENSIN  Take 1 tablet by mouth  daily     CRESTOR 5 MG tablet  Generic drug:  rosuvastatin  Take 1 tablet by mouth at   bedtime     cyclobenzaprine 10 MG tablet  Commonly known as:  FLEXERIL  Take 1 tablet (10 mg total) by mouth at bedtime.     diphenhydrAMINE 25 MG tablet  Commonly known as:  BENADRYL  Take 25 mg by mouth at bedtime as needed.     fenofibrate 145 MG tablet  Commonly known as:  TRICOR  Take 1 tablet (145 mg total) by mouth daily.     fish oil-omega-3 fatty acids 1000 MG capsule  Take 3 g by mouth daily.     fluticasone 50 MCG/ACT nasal spray  Commonly known as:  FLONASE  Place 2 sprays into the nose daily. As needed     guaiFENesin-codeine 100-10 MG/5ML syrup  Commonly known as:  ROBITUSSIN AC  Take 5 mLs by mouth at bedtime as needed.     MUCINEX 600 MG 12 hr tablet  Generic drug:  guaiFENesin  Take 600 mg by mouth 2 (two) times daily as needed.     naproxen 500 MG tablet  Commonly known as:  NAPROSYN  Take 1 tablet (500 mg total) by mouth 2 (two) times daily with a meal.     ondansetron 8 MG disintegrating tablet  Commonly known as:  ZOFRAN-ODT  Take 1  tablet (8 mg total) by mouth every 8 (eight) hours as needed for nausea or vomiting.     oxyCODONE-acetaminophen 5-325 MG per tablet  Commonly known as:  PERCOCET/ROXICET  Take 1 tablet by mouth every 4 (four) hours as needed.

## 2013-10-13 NOTE — Progress Notes (Signed)
Pre-visit discussion using our clinic review tool. No additional management support is needed unless otherwise documented below in the visit note.  

## 2013-10-20 ENCOUNTER — Ambulatory Visit (INDEPENDENT_AMBULATORY_CARE_PROVIDER_SITE_OTHER): Payer: Medicare Other | Admitting: Family Medicine

## 2013-10-20 ENCOUNTER — Encounter: Payer: Self-pay | Admitting: Family Medicine

## 2013-10-20 DIAGNOSIS — R109 Unspecified abdominal pain: Secondary | ICD-10-CM | POA: Diagnosis not present

## 2013-10-20 LAB — CBC WITH DIFFERENTIAL/PLATELET
Basophils Relative: 0 % (ref 0–1)
HCT: 45 % (ref 36.0–46.0)
Hemoglobin: 15.4 g/dL — ABNORMAL HIGH (ref 12.0–15.0)
MCH: 28.4 pg (ref 26.0–34.0)
MCHC: 34.2 g/dL (ref 30.0–36.0)
Monocytes Absolute: 0.6 10*3/uL (ref 0.1–1.0)
Monocytes Relative: 14 % — ABNORMAL HIGH (ref 3–12)
Neutro Abs: 2.5 10*3/uL (ref 1.7–7.7)
RBC: 5.42 MIL/uL — ABNORMAL HIGH (ref 3.87–5.11)
WBC: 4.5 10*3/uL (ref 4.0–10.5)

## 2013-10-20 LAB — COMPREHENSIVE METABOLIC PANEL
AST: 54 U/L — ABNORMAL HIGH (ref 0–37)
Albumin: 4.7 g/dL (ref 3.5–5.2)
Alkaline Phosphatase: 41 U/L (ref 39–117)
BUN: 19 mg/dL (ref 6–23)
Calcium: 9.6 mg/dL (ref 8.4–10.5)
Creat: 1.03 mg/dL (ref 0.50–1.10)
Total Bilirubin: 0.6 mg/dL (ref 0.3–1.2)

## 2013-10-20 MED ORDER — METRONIDAZOLE 500 MG PO TABS: 500.0000 mg | ORAL_TABLET | Freq: Three times a day (TID) | ORAL | Status: DC

## 2013-10-20 MED ORDER — CIPROFLOXACIN HCL 500 MG PO TABS: 500.0000 mg | ORAL_TABLET | Freq: Two times a day (BID) | ORAL | Status: DC

## 2013-10-20 NOTE — Assessment & Plan Note (Signed)
With h/o diverticulosis and diarrhea, along with feeling feverish. Treat with cipro/flagyl Check stat blood work today. If not improving, low threshold to check CT.  Discussed reasons to seek care for worsening sxs.

## 2013-10-20 NOTE — Progress Notes (Signed)
Pre-visit discussion using our clinic review tool. No additional management support is needed unless otherwise documented below in the visit note.  

## 2013-10-20 NOTE — Addendum Note (Signed)
Addended by: [name] on: 10/20/2013 05:48 PM   Modules accepted: Orders

## 2013-10-20 NOTE — Progress Notes (Signed)
Subjective:    Patient ID: Katherine Oliver, female    DOB: [birth year], 67 y.o.   MRN: [id number]  HPI CC: lower back pain  Seen here 1 wk ago by Dr. Lorelei Pont with right lower back pain, nausea.  Started after she woke up in bed with right lower back pain.  Thought possible GI distress vs pulled muscle.  UA with moderate blood, large protein.  UCx not sent.  Treated with zofran, flexeril and percocets. Continued back pain.  Yesterday diarrhea started.  Once an hour.  Loose to watery stool.   Trying to stay hydrated with water and chicken broth but having trouble with this. Bilateral lower quadrant pain described as sharp stabbing.  Occasional feverish, chills. Denies blood in stool , blod in urine, dysuria, urine urgency or frequency.  Did feel some better over weekend, then started feeling worse again.  Last abx course was 08/2013 for recurrent UTI, but it does not feel like this today. H/o diverticulosis on last colonoscopy 08/2012.  Past Medical History  Diagnosis Date  . Prediabetes   . HTN (hypertension)   . HLD (hyperlipidemia)   . Hepatic steatosis     by abd [place] 05/2012, mild transaminitis - normal iron sat and viral hep panel (2011)  . History of recurrent UTIs     on chronic keflex  . 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 Arthritis   . Allergic rhinitis   . History of chicken pox   . History of measles   . Rosacea     metrogel  . GERD (gastroesophageal reflux disease)   . Polycythemia     mild, stable (2013)  . Osteopenia 06/2013    mild, forearm T -1.1, hip and spine WNL   Past Surgical History  Procedure Laterality Date  . Appendectomy  1987  . Vaginal hysterectomy  1984    for menorrhagia, ovaries in place  . Cholecystectomy  2003  . Dexa  2003    normal  . Cardiolite stress test  04/2004    normal  . Cesarean section  [id number]    x2  . Colonoscopy  09/26/2008    adenomatous polyp, rpt 3-5 yrs  . Trigger finger release   2007;2010;2011    bilateral  . Breast biopsy  1963  . Colonoscopy  08/2012    adenomatous polyps, diverticulosis, rec rpt 5 yrs [name])  . Dexa  06/2013    [facility] - Tscore -1.1 forearm, normal spine and femur    Review of Systems Per HPI    Objective:   Physical Exam  Nursing note and vitals reviewed. Constitutional: She appears well-developed and well-nourished. No distress.  HENT:  Mouth/Throat: Oropharynx is clear and moist. No oropharyngeal exudate.  Eyes: Conjunctivae and EOM are normal. Pupils are equal, round, and reactive to light.  Cardiovascular: Normal rate, regular rhythm, normal heart sounds and intact distal pulses.   No murmur heard. Pulmonary/Chest: Effort normal and breath sounds normal. No respiratory distress. She has no wheezes. She has no rales.  Abdominal: Soft. Normal appearance and bowel sounds are normal. She exhibits no distension and no mass. There is no hepatosplenomegaly. There is tenderness (moderate) in the right lower quadrant and left lower quadrant. There is CVA tenderness (R sided). There is no rigidity, no rebound, no guarding and negative Murphy's sign.  Musculoskeletal: She exhibits no edema.  Skin: Skin is warm and dry. No rash noted.       Assessment &  Plan:

## 2013-10-20 NOTE — Patient Instructions (Addendum)
I worry about diverticulitis - treat with starting 2 antibiotics today.  Continue to push small sips throughout the day, clear liquid diet. Urine checked today Blood work today. Please let us know if not improving for CT scan. If worsening pain, please seek urgent care. Give me an update tomorrow with how you're doing.

## 2013-10-21 ENCOUNTER — Telehealth: Payer: Self-pay | Admitting: Family Medicine

## 2013-10-21 DIAGNOSIS — R109 Unspecified abdominal pain: Secondary | ICD-10-CM | POA: Diagnosis not present

## 2013-10-21 LAB — POCT URINALYSIS DIPSTICK
Blood, UA: NEGATIVE
Glucose, UA: NEGATIVE
Ketones, UA: NEGATIVE
Protein, UA: NEGATIVE
Spec Grav, UA: 1.025
Urobilinogen, UA: 0.2

## 2013-10-21 NOTE — Telephone Encounter (Signed)
Urine dipped, spun and drawn for culture if needed. She did take one dose of abx last PM but none today.

## 2013-10-21 NOTE — Addendum Note (Signed)
Addended by: [name] on: 10/21/2013 12:47 PM   Modules accepted: Orders

## 2013-10-21 NOTE — Telephone Encounter (Signed)
[facility] Triage Call Report Triage Record Num: [id number] Operator: Jeb Levering Patient Name: Katherine Oliver Call Date & Time: 10/20/2013 9:37:11PM Patient Phone: [phone] PCP: Patient Gender: Female PCP Fax : Patient DOB: [birth year] Practice Name: [facility] Reason for Call: Caller: [name]; PCP: [name]); CB#: [phone]; Call regarding [name] is calling from [place] regarding a CBC ordered on [name], [name] by [name] [facility]).; Lab results out of range: (CBC) RBC-5.42, Hgb-15.4, Mono%-14. (CMP) AST-54, ALT-80. Protocol(s) Used: Office Note Recommended Outcome per Protocol: Information Noted and Sent to Office Reason for Outcome: Caller information to office Care Advice: ~ 10/20/2013 9:54:40PM Page 1 of 1 [id number]

## 2013-10-21 NOTE — Addendum Note (Signed)
Addended by: Royann Shivers A on: 10/21/2013 11:28 AM   Modules accepted: Orders

## 2013-10-21 NOTE — Telephone Encounter (Signed)
Noted.  Will route to Kim.  Pt dropped of sample urine today

## 2013-10-24 ENCOUNTER — Telehealth: Payer: Self-pay

## 2013-10-24 DIAGNOSIS — B379 Candidiasis, unspecified: Secondary | ICD-10-CM

## 2013-10-24 LAB — URINE CULTURE: Colony Count: 60000

## 2013-10-24 MED ORDER — FLUCONAZOLE 150 MG PO TABS: 150.0000 mg | ORAL_TABLET | Freq: Once | ORAL | Status: DC

## 2013-10-24 NOTE — Telephone Encounter (Signed)
plz see results from this morning's note and notify patient as well as notify that diflucan has been sent in for her.

## 2013-10-24 NOTE — Telephone Encounter (Signed)
Patient notified

## 2013-10-24 NOTE — Telephone Encounter (Signed)
Pt left v/m requesting urine culture results; pt also request diflucan sent to [facility] when pt finishes the antibiotics.Please advise.

## 2013-11-03 DIAGNOSIS — R918 Other nonspecific abnormal finding of lung field: Secondary | ICD-10-CM

## 2013-11-03 HISTORY — DX: Other nonspecific abnormal finding of lung field: R91.8

## 2013-11-21 ENCOUNTER — Other Ambulatory Visit: Payer: Self-pay | Admitting: Family Medicine

## 2013-11-21 ENCOUNTER — Telehealth: Payer: Self-pay | Admitting: Family Medicine

## 2013-11-21 DIAGNOSIS — K760 Fatty (change of) liver, not elsewhere classified: Secondary | ICD-10-CM

## 2013-11-21 DIAGNOSIS — R7401 Elevation of levels of liver transaminase levels: Secondary | ICD-10-CM

## 2013-11-21 DIAGNOSIS — R740 Nonspecific elevation of levels of transaminase and lactic acid dehydrogenase [LDH]: Principal | ICD-10-CM

## 2013-11-21 NOTE — Telephone Encounter (Signed)
Patient is coming in for a follow up appointment 12/05/13.  Patient said when she came in for her physical in July she was told to come in to have her liver checked because her liver enzymes were abnormal.  Patient would like to schedule an appointment for lab work before her appointment on 12/05/13. I didn't see the lab work ordered. Please advise.

## 2013-11-23 NOTE — Telephone Encounter (Signed)
labwork ordered.  plz schedule appt

## 2013-11-23 NOTE — Telephone Encounter (Signed)
Lab appt scheduled.

## 2013-11-23 NOTE — Addendum Note (Signed)
Addended by: [name] on: 11/23/2013 01:25 PM   Modules accepted: Orders

## 2013-11-24 ENCOUNTER — Other Ambulatory Visit: Payer: Self-pay | Admitting: Family Medicine

## 2013-11-24 MED ORDER — ONDANSETRON HCL 4 MG PO TABS: 4.0000 mg | ORAL_TABLET | Freq: Three times a day (TID) | ORAL | Status: DC | PRN

## 2013-11-24 NOTE — Telephone Encounter (Signed)
Sent in

## 2013-11-24 NOTE — Telephone Encounter (Signed)
Last office visit 10/20/2013.  Ok to refill?

## 2013-11-28 ENCOUNTER — Encounter: Payer: Self-pay | Admitting: Family Medicine

## 2013-11-28 ENCOUNTER — Ambulatory Visit (INDEPENDENT_AMBULATORY_CARE_PROVIDER_SITE_OTHER): Payer: Medicare Other | Admitting: Family Medicine

## 2013-11-28 VITALS — BP 144/92 | HR 88 | Temp 98.3°F | Wt 168.5 lb

## 2013-11-28 DIAGNOSIS — R109 Unspecified abdominal pain: Secondary | ICD-10-CM

## 2013-11-28 DIAGNOSIS — R1031 Right lower quadrant pain: Secondary | ICD-10-CM

## 2013-11-28 LAB — POCT URINALYSIS DIPSTICK
Bilirubin, UA: NEGATIVE
Blood, UA: NEGATIVE
Glucose, UA: NEGATIVE
Ketones, UA: NEGATIVE
Leukocytes, UA: NEGATIVE
Nitrite, UA: NEGATIVE
Protein, UA: NEGATIVE
UROBILINOGEN UA: 0.2
pH, UA: 6.5

## 2013-11-28 MED ORDER — BENAZEPRIL HCL 20 MG PO TABS: ORAL_TABLET | ORAL | Status: DC

## 2013-11-28 MED ORDER — OXYCODONE-ACETAMINOPHEN 5-325 MG PO TABS: 1.0000 | ORAL_TABLET | ORAL | Status: DC | PRN

## 2013-11-28 NOTE — Patient Instructions (Addendum)
I am suspicious for a kidney stone. Given how long this has been going on as well as the noted weight loss, I do want to obtain a CT scan to better evaluate your kidney and ovary on the right. Pass by [name]'s office to set this up. Urine checked today - was looking ok.

## 2013-11-28 NOTE — Progress Notes (Signed)
Subjective:    Patient ID: Katherine Oliver, female    DOB: December 09, 1945, 68 y.o.   MRN: [id number]  HPI CC: lower back pain  See prior note for details. Briefly, seen here last month with presumptive diagnosis of diverticulitis, treated with cipro/flagyl and did improve.  WBC 4.6, LFTs slightly elevated.  Felt fine after antibiotics.  Then last week on Thursday awoke with severe lower right back pain and vomiting.  Points just below waist with radiation across abdomen.  Describes stabbing sharp pain initially, now alternating with throbbing pain.  No diarrhea at this time.  Vomiting improved with zofran, pain mildly improved with percocets.  Symptoms ongoing for last 4 days.  Initially with fevers/chills.  Last emesis was 5 days ago - NBNB.  Currently stools ok - slightly more constipated from oxycodone.  Otherwise normal stools, normal flatus.  Appetite ok.  Denies urinary changes.  Staying well hydrated.  No early satiety or dysphagia.  Colonoscopy 2013 - 1 adenomatous polyp, rec rpt 5 yrs [name]).  Weight loss noted. Denies UTI sxs. States this does not feel like her prior muscle spasms.  Wt Readings from Last 3 Encounters:  11/28/13 168 lb 8 oz (76.431 kg)  10/20/13 172 lb 8 oz (78.245 kg)  10/13/13 174 lb 8 oz (79.153 kg)   Lab Results  Component Value Date   CREATININE 1.03 10/20/2013    Past Medical History  Diagnosis Date  . Prediabetes   . HTN (hypertension)   . HLD (hyperlipidemia)   . Hepatic steatosis     by abd [place] 05/2012, mild transaminitis - normal iron sat and viral hep panel (2011)  . History of recurrent UTIs     on chronic keflex  . 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 Arthritis   . Allergic rhinitis   . History of chicken pox   . History of measles   . Rosacea     metrogel  . GERD (gastroesophageal reflux disease)   . Polycythemia     mild, stable (2013)  . Osteopenia 06/2013    mild, forearm T -1.1, hip and spine WNL    Past Surgical History  Procedure Laterality Date  . Appendectomy  1987  . Vaginal hysterectomy  1984    for menorrhagia, ovaries in place  . Cholecystectomy  2003  . Dexa  2003    normal  . Cardiolite stress test  04/2004    normal  . Cesarean section  [id number]    x2  . Colonoscopy  09/26/2008    adenomatous polyp, rpt 3-5 yrs  . Trigger finger release  2007;2010;2011    bilateral  . Breast biopsy  1963  . Colonoscopy  08/2012    adenomatous polyps, diverticulosis, rec rpt 5 yrs [name])  . Dexa  06/2013    [facility] - Tscore -1.1 forearm, normal spine and femur   Review of Systems Per HPI    Objective:   Physical Exam  Nursing note and vitals reviewed. Constitutional: She appears well-developed and well-nourished. No distress.  HENT:  Mouth/Throat: Oropharynx is clear and moist. No oropharyngeal exudate.  Cardiovascular: Normal rate, regular rhythm, normal heart sounds and intact distal pulses.   No murmur heard. Pulmonary/Chest: Effort normal and breath sounds normal. No respiratory distress. She has no wheezes. She has no rales.  Abdominal: Soft. She exhibits distension (mild). She exhibits no mass. Bowel sounds are decreased. There is no hepatosplenomegaly. There is tenderness in the right  lower quadrant and suprapubic area. There is no rigidity, no rebound, no guarding, no CVA tenderness and negative Murphy's sign.  Musculoskeletal: She exhibits no edema.       Assessment & Plan:

## 2013-11-28 NOTE — Assessment & Plan Note (Addendum)
Not consistent with MSK pain. Will need to eval R ovary as well as R kidney - check CT abd with contrast - for ovary evaluation - and without contrast - for kidney stone evaluation although today no blood in urine. Concern given weight loss noted and persistent nature of this recurrent pain. Pt agrees with plan.

## 2013-11-29 ENCOUNTER — Other Ambulatory Visit (INDEPENDENT_AMBULATORY_CARE_PROVIDER_SITE_OTHER): Payer: Medicare Other

## 2013-11-29 DIAGNOSIS — R7401 Elevation of levels of liver transaminase levels: Secondary | ICD-10-CM | POA: Diagnosis not present

## 2013-11-29 DIAGNOSIS — K760 Fatty (change of) liver, not elsewhere classified: Secondary | ICD-10-CM

## 2013-11-29 DIAGNOSIS — R7402 Elevation of levels of lactic acid dehydrogenase (LDH): Secondary | ICD-10-CM | POA: Diagnosis not present

## 2013-11-29 DIAGNOSIS — K7689 Other specified diseases of liver: Secondary | ICD-10-CM | POA: Diagnosis not present

## 2013-11-29 DIAGNOSIS — R740 Nonspecific elevation of levels of transaminase and lactic acid dehydrogenase [LDH]: Principal | ICD-10-CM

## 2013-11-29 LAB — HEPATIC FUNCTION PANEL
ALT: 50 U/L — AB (ref 0–35)
AST: 30 U/L (ref 0–37)
Albumin: 4.3 g/dL (ref 3.5–5.2)
Alkaline Phosphatase: 51 U/L (ref 39–117)
Bilirubin, Direct: 0.1 mg/dL (ref 0.0–0.3)
Total Bilirubin: 0.7 mg/dL (ref 0.3–1.2)
Total Protein: 7.2 g/dL (ref 6.0–8.3)

## 2013-12-01 ENCOUNTER — Other Ambulatory Visit: Payer: Medicare Other

## 2013-12-02 ENCOUNTER — Ambulatory Visit (INDEPENDENT_AMBULATORY_CARE_PROVIDER_SITE_OTHER)
Admission: RE | Admit: 2013-12-02 | Discharge: 2013-12-02 | Disposition: A | Discharge status: Home / Self Care | Payer: Medicare Other | Source: Ambulatory Visit | Attending: Family Medicine | Admitting: Family Medicine

## 2013-12-02 DIAGNOSIS — R109 Unspecified abdominal pain: Secondary | ICD-10-CM

## 2013-12-02 DIAGNOSIS — R1031 Right lower quadrant pain: Secondary | ICD-10-CM

## 2013-12-02 DIAGNOSIS — N281 Cyst of kidney, acquired: Secondary | ICD-10-CM | POA: Diagnosis not present

## 2013-12-02 MED ORDER — IOHEXOL 300 MG/ML  SOLN
100.0000 mL | Freq: Once | INTRAMUSCULAR | Status: AC | PRN
  Administered 2013-12-02: 100 mL via INTRAVENOUS

## 2013-12-04 ENCOUNTER — Encounter: Payer: Self-pay | Admitting: Family Medicine

## 2013-12-05 ENCOUNTER — Ambulatory Visit: Payer: Medicare Other | Admitting: Family Medicine

## 2013-12-15 ENCOUNTER — Ambulatory Visit (INDEPENDENT_AMBULATORY_CARE_PROVIDER_SITE_OTHER): Payer: Medicare Other | Admitting: Family Medicine

## 2013-12-15 ENCOUNTER — Encounter: Payer: Self-pay | Admitting: Family Medicine

## 2013-12-15 VITALS — BP 144/88 | HR 84 | Temp 97.4°F | Wt 166.2 lb

## 2013-12-15 DIAGNOSIS — R109 Unspecified abdominal pain: Secondary | ICD-10-CM

## 2013-12-15 LAB — LIPASE: Lipase: 61 U/L — ABNORMAL HIGH (ref 11.0–59.0)

## 2013-12-15 MED ORDER — OXYCODONE-ACETAMINOPHEN 5-325 MG PO TABS: 1.0000 | ORAL_TABLET | ORAL | Status: DC | PRN

## 2013-12-15 MED ORDER — FLUTICASONE PROPIONATE 50 MCG/ACT NA SUSP: 2.0000 | Freq: Every day | NASAL | Status: DC

## 2013-12-15 MED ORDER — CYCLOBENZAPRINE HCL 10 MG PO TABS: 10.0000 mg | ORAL_TABLET | Freq: Every day | ORAL | Status: DC

## 2013-12-15 NOTE — Patient Instructions (Signed)
I wonder about post shingles pain. Treat with gabapentin 100mg  nightly for 2 nights then increase to 100mg  twice daily for 2-3 days then increase to 200mg  twice daily as tolerated (may go up to 300mg  twice daily).  If no response to this medicine, let me know for referral to stomach doctor. Blood work today.

## 2013-12-15 NOTE — Progress Notes (Signed)
BP 144/88  Pulse 84  Temp(Src) 97.4 F (36.3 C) (Oral)  Wt 166 lb 4 oz (75.411 kg)   CC: f/u not feeling better  Subjective:    Patient ID: Katherine Oliver, female    DOB: Mar 06, 1946, 68 y.o.   MRN: [id number]  HPI: Katherine Oliver is a 68 y.o. female presenting on 12/15/2013 with Follow-up and Medication Refill  See prior notes for details.  Briefly, several month history of R flank and RUQ pain that comes and goes.  sxs started 10/2013.  Initially treated for diverticulitis with cipro/flagyl and sxs improved.  WBC 4.6, LFTs slightly elevated.  Then sxs returned with severe lower R back pain and vomiting, but no hematuria on evaluation pointing against kidney stone.  CT abd/pelvis with and without contrast obtained overall unrevealing.  Persistent pain - R flank that travels to front RUQ and slightly lower, then stays as a dull ache, worsens to sharp pain with pressure.  Daily chronic pain, never improves only eases off slightly with heating pad. Currently controlling pain with ibuprofen and using heating pad which helps. Has taken tums which does relieve pain intermittently.  Colonoscopy 2013 - 1 adenomatous polyp, rec rpt 5 yrs [name]). Slight constipation but overall very regular. No fevers/chills, urinary symptoms, nausea/vomiting,  H/o gastritis with indigestion but this feels different.  She has had several surgeries in past including cesarean and hysterectomy, as well as appendicitis complicated by peritonitis.  Wonders about adhesions vs pancreatitis although recent CT scan did mention pancreas was normal.  Wt Readings from Last 3 Encounters:  12/15/13 166 lb 4 oz (75.411 kg)  11/28/13 168 lb 8 oz (76.431 kg)  10/20/13 172 lb 8 oz (78.245 kg)   EXAM :  CT ABDOMEN AND PELVIS WITHOUT AND WITH CONTRAST  CONTRAST: 114mL OMNIPAQUE IOHEXOL 300 MG/ML SOLN. Dilute oral contrast.  COMPARISON: None  FINDINGS:  3 mm right middle lobe nodule image 1.  Question minimal  nodularity at lateral base of right lower lobe  image 3 and lateral left lower lobe base images 10 and 13, largest 8  mm diameter.  Gallbladder surgically absent.  Precontrast images demonstrate mild fatty infiltration of liver.  Minimally prominent right extrarenal pelvis without ureteral  calcification or dilatation.  17 x 14 mm low-attenuation nodule laterally at inferior pole left  kidney image 44 showing no enhancement postcontrast consistent with  cyst.  Liver, spleen, pancreas, kidneys, and adrenal glands normal  appearance.  Minimal sigmoid diverticulosis without evidence of diverticulitis.  Uterus surgically absent with normal appearing ovaries.  Appendix not visualized, by history appendectomy.  Stomach and bowel loops otherwise normal appearance.  No mass, adenopathy, free fluid or inflammatory process.  No definite hernia or acute bone lesion.  IMPRESSION:  Small right renal cyst.  Minimal sigmoid diverticulosis without evidence of diverticulitis.  No acute intra-abdominal or intrapelvic process.  Nonspecific nodules at lung bases, largest 8 mm diameter.  If the patient is at high risk for bronchogenic carcinoma, follow-up  chest CT at 3-52months is recommended. If the patient is at low risk  for bronchogenic carcinoma, follow-up chest CT at 6-12 months is  recommended. This recommendation follows the consensus statement:  Guidelines for Management of Small Pulmonary Nodules Detected on CT  Scans: A Statement from the [employer] as published in  Radiology 2005; 237:395-400.    Relevant past medical, surgical, family and social history reviewed and updated. Allergies and medications reviewed and updated. Current Outpatient Prescriptions on File Prior to Visit  Medication Sig  . amLODipine (NORVASC) 10 MG tablet Take 1 tablet (10 mg total) by mouth daily.  . benazepril (LOTENSIN) 20 MG tablet Take 1 tablet by mouth  daily  . CRESTOR 5 MG tablet Take 1 tablet by  mouth at   bedtime  . diphenhydrAMINE (BENADRYL) 25 MG tablet Take 25 mg by mouth at bedtime as needed.  . fenofibrate (TRICOR) 145 MG tablet Take 1 tablet by mouth  daily  . fish oil-omega-3 fatty acids 1000 MG capsule Take 3 g by mouth daily.   Marland Kitchen guaiFENesin (MUCINEX) 600 MG 12 hr tablet Take 600 mg by mouth 2 (two) times daily as needed.  Marland Kitchen guaiFENesin-codeine (ROBITUSSIN AC) 100-10 MG/5ML syrup Take 5 mLs by mouth at bedtime as needed.  . naproxen (NAPROSYN) 500 MG tablet Take 1 tablet (500 mg total) by mouth 2 (two) times daily with a meal.  . ondansetron (ZOFRAN) 4 MG tablet Take 1 tablet (4 mg total) by mouth every 8 (eight) hours as needed for nausea or vomiting.   No current facility-administered medications on file prior to visit.    Review of Systems Per HPI unless specifically indicated above    Objective:    BP 144/88  Pulse 84  Temp(Src) 97.4 F (36.3 C) (Oral)  Wt 166 lb 4 oz (75.411 kg)  Physical Exam  Nursing note and vitals reviewed. Constitutional: She appears well-developed and well-nourished. No distress.  HENT:  Mouth/Throat: Oropharynx is clear and moist. No oropharyngeal exudate.  Cardiovascular: Normal rate, regular rhythm, normal heart sounds and intact distal pulses.   No murmur heard. Pulmonary/Chest: Effort normal and breath sounds normal. No respiratory distress. She has no wheezes. She has no rales.  Abdominal: Soft. Normal appearance and bowel sounds are normal. She exhibits no distension and no mass. There is no hepatosplenomegaly. There is tenderness (superficial tenderness) in the right upper quadrant. There is no rigidity, no rebound, no guarding, no CVA tenderness and negative Murphy's sign.  Actually reproducible tenderness to palpation along T6 distribution - burning, tingling, numbness reproducible to light touch  Musculoskeletal: She exhibits no edema.  Skin: Skin is warm. No rash noted.   Results for orders placed in visit on 12/15/13    LIPASE      Result Value Ref Range   Lipase 61.0 (*) 11.0 - 59.0 U/L      Assessment & Plan:   Problem List Items Addressed This Visit   Right sided abdominal pain - Primary     Stable blood work, overall normal CT abd/pelvis with and without contrast. Actually, today's story and exam is more consistent with neuropathic pain -?post herpetic neuralgia after rash-less shingles.  Will treat with trial of gabapentin - slowly titrate dose. Consider pancreatitis (checked lipase) vs adhesion related. H/o mild sigmoid diverticulitis.  Known h/o fatty liver, doubt related. If no improvement with gabapentin, consider referral to GI.        Follow up plan: Return if symptoms worsen or fail to improve.

## 2013-12-15 NOTE — Assessment & Plan Note (Addendum)
Stable blood work, overall normal CT abd/pelvis with and without contrast. Actually, today's story and exam is more consistent with neuropathic pain -?post herpetic neuralgia after rash-less shingles.  Will treat with trial of gabapentin - slowly titrate dose. Consider pancreatitis (checked lipase) vs adhesion related. H/o mild sigmoid diverticulitis.  Known h/o fatty liver, doubt related. If no improvement with gabapentin, consider referral to GI.

## 2013-12-15 NOTE — Progress Notes (Signed)
Pre-visit discussion using our clinic review tool. No additional management support is needed unless otherwise documented below in the visit note.  

## 2013-12-16 MED ORDER — GABAPENTIN 100 MG PO CAPS: ORAL_CAPSULE | ORAL | Status: DC

## 2013-12-16 NOTE — Addendum Note (Signed)
Addended by: [name] on: 12/16/2013 01:19 PM   Modules accepted: Orders

## 2014-01-11 ENCOUNTER — Other Ambulatory Visit: Payer: Self-pay | Admitting: Family Medicine

## 2014-04-10 ENCOUNTER — Encounter: Payer: Self-pay | Admitting: Family Medicine

## 2014-05-27 ENCOUNTER — Other Ambulatory Visit: Payer: Self-pay | Admitting: Family Medicine

## 2014-05-27 DIAGNOSIS — K760 Fatty (change of) liver, not elsewhere classified: Secondary | ICD-10-CM

## 2014-05-27 DIAGNOSIS — M8580 Other specified disorders of bone density and structure, unspecified site: Secondary | ICD-10-CM

## 2014-05-27 DIAGNOSIS — R7303 Prediabetes: Secondary | ICD-10-CM

## 2014-05-27 DIAGNOSIS — I10 Essential (primary) hypertension: Secondary | ICD-10-CM

## 2014-05-27 DIAGNOSIS — E785 Hyperlipidemia, unspecified: Secondary | ICD-10-CM

## 2014-05-29 ENCOUNTER — Other Ambulatory Visit (INDEPENDENT_AMBULATORY_CARE_PROVIDER_SITE_OTHER): Payer: Medicare Other

## 2014-05-29 DIAGNOSIS — E785 Hyperlipidemia, unspecified: Secondary | ICD-10-CM | POA: Diagnosis not present

## 2014-05-29 DIAGNOSIS — K760 Fatty (change of) liver, not elsewhere classified: Secondary | ICD-10-CM

## 2014-05-29 DIAGNOSIS — M949 Disorder of cartilage, unspecified: Secondary | ICD-10-CM

## 2014-05-29 DIAGNOSIS — R7303 Prediabetes: Secondary | ICD-10-CM

## 2014-05-29 DIAGNOSIS — I10 Essential (primary) hypertension: Secondary | ICD-10-CM

## 2014-05-29 DIAGNOSIS — K7689 Other specified diseases of liver: Secondary | ICD-10-CM

## 2014-05-29 DIAGNOSIS — R740 Nonspecific elevation of levels of transaminase and lactic acid dehydrogenase [LDH]: Secondary | ICD-10-CM

## 2014-05-29 DIAGNOSIS — M8580 Other specified disorders of bone density and structure, unspecified site: Secondary | ICD-10-CM

## 2014-05-29 DIAGNOSIS — R7401 Elevation of levels of liver transaminase levels: Secondary | ICD-10-CM

## 2014-05-29 DIAGNOSIS — R7309 Other abnormal glucose: Secondary | ICD-10-CM | POA: Diagnosis not present

## 2014-05-29 DIAGNOSIS — R7402 Elevation of levels of lactic acid dehydrogenase (LDH): Secondary | ICD-10-CM

## 2014-05-29 DIAGNOSIS — M899 Disorder of bone, unspecified: Secondary | ICD-10-CM

## 2014-05-29 LAB — COMPREHENSIVE METABOLIC PANEL
ALBUMIN: 4.5 g/dL (ref 3.5–5.2)
ALT: 55 U/L — AB (ref 0–35)
AST: 30 U/L (ref 0–37)
Alkaline Phosphatase: 52 U/L (ref 39–117)
BUN: 19 mg/dL (ref 6–23)
CALCIUM: 10.2 mg/dL (ref 8.4–10.5)
CHLORIDE: 104 meq/L (ref 96–112)
CO2: 29 mEq/L (ref 19–32)
Creatinine, Ser: 1 mg/dL (ref 0.4–1.2)
GFR: 58.97 mL/min — ABNORMAL LOW (ref >60.00)
Glucose, Bld: 117 mg/dL — ABNORMAL HIGH (ref 70–99)
POTASSIUM: 4.6 meq/L (ref 3.5–5.1)
Sodium: 141 mEq/L (ref 135–145)
Total Bilirubin: 0.5 mg/dL (ref 0.2–1.2)
Total Protein: 7.3 g/dL (ref 6.0–8.3)

## 2014-05-29 LAB — LIPID PANEL
Cholesterol: 159 mg/dL (ref 0–200)
HDL: 49.7 mg/dL (ref >39.00)
LDL Cholesterol: 74 mg/dL (ref 0–99)
NONHDL: 109.3
Total CHOL/HDL Ratio: 3
Triglycerides: 179 mg/dL — ABNORMAL HIGH (ref 0.0–149.0)
VLDL: 35.8 mg/dL (ref 0.0–40.0)

## 2014-05-29 LAB — HEMOGLOBIN A1C: Hgb A1c MFr Bld: 6.3 % (ref 4.6–6.5)

## 2014-05-29 LAB — VITAMIN D 25 HYDROXY (VIT D DEFICIENCY, FRACTURES): VITD: 45.96 ng/mL (ref 30.00–100.00)

## 2014-06-03 DIAGNOSIS — H35033 Hypertensive retinopathy, bilateral: Secondary | ICD-10-CM

## 2014-06-03 HISTORY — DX: Hypertensive retinopathy, bilateral: H35.033

## 2014-06-05 ENCOUNTER — Encounter: Payer: Medicare Other | Admitting: Family Medicine

## 2014-06-13 DIAGNOSIS — H524 Presbyopia: Secondary | ICD-10-CM | POA: Diagnosis not present

## 2014-06-13 DIAGNOSIS — H35039 Hypertensive retinopathy, unspecified eye: Secondary | ICD-10-CM | POA: Diagnosis not present

## 2014-06-13 DIAGNOSIS — H35369 Drusen (degenerative) of macula, unspecified eye: Secondary | ICD-10-CM | POA: Diagnosis not present

## 2014-06-13 DIAGNOSIS — H2510 Age-related nuclear cataract, unspecified eye: Secondary | ICD-10-CM | POA: Diagnosis not present

## 2014-06-26 ENCOUNTER — Encounter: Payer: Self-pay | Admitting: Family Medicine

## 2014-06-30 ENCOUNTER — Ambulatory Visit (INDEPENDENT_AMBULATORY_CARE_PROVIDER_SITE_OTHER): Payer: Medicare Other | Admitting: Family Medicine

## 2014-06-30 ENCOUNTER — Other Ambulatory Visit: Payer: Self-pay | Admitting: *Deleted

## 2014-06-30 ENCOUNTER — Encounter: Payer: Self-pay | Admitting: Family Medicine

## 2014-06-30 VITALS — BP 136/64 | HR 88 | Temp 98.2°F | Ht 61.0 in | Wt 172.5 lb

## 2014-06-30 DIAGNOSIS — M949 Disorder of cartilage, unspecified: Secondary | ICD-10-CM

## 2014-06-30 DIAGNOSIS — R740 Nonspecific elevation of levels of transaminase and lactic acid dehydrogenase [LDH]: Secondary | ICD-10-CM

## 2014-06-30 DIAGNOSIS — Z0000 Encounter for general adult medical examination without abnormal findings: Secondary | ICD-10-CM

## 2014-06-30 DIAGNOSIS — M8580 Other specified disorders of bone density and structure, unspecified site: Secondary | ICD-10-CM

## 2014-06-30 DIAGNOSIS — I10 Essential (primary) hypertension: Secondary | ICD-10-CM | POA: Diagnosis not present

## 2014-06-30 DIAGNOSIS — R7401 Elevation of levels of liver transaminase levels: Secondary | ICD-10-CM

## 2014-06-30 DIAGNOSIS — Z23 Encounter for immunization: Secondary | ICD-10-CM | POA: Diagnosis not present

## 2014-06-30 DIAGNOSIS — R7303 Prediabetes: Secondary | ICD-10-CM

## 2014-06-30 DIAGNOSIS — E785 Hyperlipidemia, unspecified: Secondary | ICD-10-CM

## 2014-06-30 DIAGNOSIS — M899 Disorder of bone, unspecified: Secondary | ICD-10-CM

## 2014-06-30 DIAGNOSIS — R7309 Other abnormal glucose: Secondary | ICD-10-CM | POA: Diagnosis not present

## 2014-06-30 MED ORDER — HYDROCHLOROTHIAZIDE 12.5 MG PO CAPS: 12.5000 mg | ORAL_CAPSULE | Freq: Every day | ORAL | Status: DC | PRN

## 2014-06-30 MED ORDER — BENAZEPRIL HCL 20 MG PO TABS: ORAL_TABLET | ORAL | Status: DC

## 2014-06-30 MED ORDER — ROSUVASTATIN CALCIUM 5 MG PO TABS: ORAL_TABLET | ORAL | Status: DC

## 2014-06-30 MED ORDER — FENOFIBRATE 145 MG PO TABS: ORAL_TABLET | ORAL | Status: DC

## 2014-06-30 MED ORDER — AMLODIPINE BESYLATE 10 MG PO TABS: 10.0000 mg | ORAL_TABLET | Freq: Every day | ORAL | Status: DC

## 2014-06-30 NOTE — Assessment & Plan Note (Signed)
Continue calcium and vit d

## 2014-06-30 NOTE — Assessment & Plan Note (Signed)
Stable. Reviewed dx with patient.

## 2014-06-30 NOTE — Assessment & Plan Note (Signed)
Chronic, overall stable except for elevated trig. No changes today.

## 2014-06-30 NOTE — Progress Notes (Signed)
BP 136/64  Pulse 88  Temp(Src) 98.2 F (36.8 C) (Oral)  Ht 5\' 1"  (1.549 m)  Wt 172 lb 8 oz (78.245 kg)  BMI 32.61 kg/m2   CC: medicare wellness visit  Subjective:    Patient ID: Katherine Oliver, female    DOB: 1946-05-16, 68 y.o.   MRN: [id number]  HPI: Katherine Oliver is a 68 y.o. female presenting on 06/30/2014 for Annual Exam   Noticed some swelling of legs with prolonged standing as well as with prolonged car ride. Elevating legs helps. Not bothersome. Avoiding salt in diet.   Notices some lightheadedness if she doesn't get regular meals.   HTN - compliant with bp meds, one in am and one at night time. States bp ranging 140s/70s.   Hearing screening normal today.  Recent vision screen with eye exam. No falls in last year.  Denies anger, depression, anhedonia, sadness.  Preventative: Colon 08/2012 - adenomatous polyps, diverticulosis, rec rpt 5 yrs [name]). Father with colon cancer age 78s.  Mammogram - 08/2013 normal.  Pap smear - stopped around 68yo, s/p hysterectomy for heavy bleeding. Both ovaries remain. Declines continued pelvic exam, discussed changes to monitor for. Dexa Date: 06/2013 [facility] - Tscore -1.1 forearm, normal spine and femur Pneumovax 05/2012, prevnar today Td 2006  Flu shot  Shingles shot 2010  Advanced directives: husband [name] is [place]. Updated in chart.  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 Daily walking about 20 min.  Diet: ok water intake 4 glasses/day, daily fruits/vegetables, red meat 4x/wk, fish 3-4x/wk   Relevant past medical, surgical, family and social history reviewed and updated as indicated.  Allergies and medications reviewed and updated. Current Outpatient Prescriptions on File Prior to Visit  Medication Sig  . diphenhydrAMINE (BENADRYL) 25 MG tablet Take 25 mg  by mouth at bedtime as needed.  . fish oil-omega-3 fatty acids 1000 MG capsule Take 3 g by mouth daily.   . fluticasone (FLONASE) 50 MCG/ACT nasal spray Place 2 sprays into both nostrils daily. As needed   No current facility-administered medications on file prior to visit.    Review of Systems Per HPI unless specifically indicated above    Objective:    BP 136/64  Pulse 88  Temp(Src) 98.2 F (36.8 C) (Oral)  Ht 5\' 1"  (1.549 m)  Wt 172 lb 8 oz (78.245 kg)  BMI 32.61 kg/m2  Physical Exam  Nursing note and vitals reviewed. Constitutional: She is oriented to person, place, and time. She appears well-developed and well-nourished. No distress.  HENT:  Head: Normocephalic and atraumatic.  Right Ear: Hearing, tympanic membrane, external ear and ear canal normal.  Left Ear: Hearing, tympanic membrane, external ear and ear canal normal.  Nose: Nose normal.  Mouth/Throat: Uvula is midline, oropharynx is clear and moist and mucous membranes are normal. No oropharyngeal exudate, posterior oropharyngeal edema or posterior oropharyngeal erythema.  Eyes: Conjunctivae and EOM are normal. Pupils are equal, round, and reactive to light. No scleral icterus.  Neck: Normal range of motion. Neck supple. Carotid bruit is not present. No thyromegaly present.  Cardiovascular: Normal rate, regular rhythm, normal heart sounds and intact distal pulses.   No murmur heard. Pulses:      Radial pulses are 2+ on the right side, and 2+ on the left side.  Pulmonary/Chest: Effort normal and breath sounds normal. No respiratory distress.  She has no wheezes. She has no rales.  Abdominal: Soft. Bowel sounds are normal. She exhibits no distension and no mass. There is no tenderness. There is no rebound and no guarding.  Musculoskeletal: Normal range of motion. She exhibits no edema.  Lymphadenopathy:    She has no cervical adenopathy.  Neurological: She is alert and oriented to person, place, and time.  CN grossly  intact, station and gait intact Recall 3/3 Calculation 5/5 serial 7s  Skin: Skin is warm and dry. No rash noted.  Psychiatric: She has a normal mood and affect. Her behavior is normal. Judgment and thought content normal.   Results for orders placed in visit on 05/29/14  LIPID PANEL      Result Value Ref Range   Cholesterol 159  0 - 200 mg/dL   Triglycerides 179.0 (*) 0.0 - 149.0 mg/dL   HDL 49.70  >39.00 mg/dL   VLDL 35.8  0.0 - 40.0 mg/dL   LDL Cholesterol 74  0 - 99 mg/dL   Total CHOL/HDL Ratio 3     NonHDL 109.30    COMPREHENSIVE METABOLIC PANEL      Result Value Ref Range   Sodium 141  135 - 145 mEq/L   Potassium 4.6  3.5 - 5.1 mEq/L   Chloride 104  96 - 112 mEq/L   CO2 29  19 - 32 mEq/L   Glucose, Bld 117 (*) 70 - 99 mg/dL   BUN 19  6 - 23 mg/dL   Creatinine, Ser 1.0  0.4 - 1.2 mg/dL   Total Bilirubin 0.5  0.2 - 1.2 mg/dL   Alkaline Phosphatase 52  39 - 117 U/L   AST 30  0 - 37 U/L   ALT 55 (*) 0 - 35 U/L   Total Protein 7.3  6.0 - 8.3 g/dL   Albumin 4.5  3.5 - 5.2 g/dL   Calcium 10.2  8.4 - 10.5 mg/dL   GFR 58.97 (*) >60.00 mL/min  HEMOGLOBIN A1C      Result Value Ref Range   Hemoglobin A1C 6.3  4.6 - 6.5 %  VITAMIN D 25 HYDROXY      Result Value Ref Range   VITD 45.96  30.00 - 100.00 ng/mL      Assessment & Plan:   Problem List Items Addressed This Visit   HLD (hyperlipidemia)     Chronic, overall stable except for elevated trig. No changes today.    Relevant Medications      aspirin 81 MG tablet      hydrochlorothiazide (MICROZIDE) 12.5 MG capsule   HTN (hypertension)     Some elevated readings. Pt also experiencing dependent edema. Will add on hctz 12.5mg  qd prn elevated bp or leg swelling.  Pt agrees with plan. Discussed continued elevation of legs.    Relevant Medications      aspirin 81 MG tablet      hydrochlorothiazide (MICROZIDE) 12.5 MG capsule   Medicare annual wellness visit, subsequent - Primary     I have personally reviewed the  Medicare Annual Wellness questionnaire and have noted 1. The patient's medical and social history 2. Their use of alcohol, tobacco or illicit drugs 3. Their current medications and supplements 4. The patient's functional ability including ADL's, fall risks, home safety risks and hearing or visual impairment. 5. Diet and physical activity 6. Evidence for depression or mood disorders The patients weight, height, BMI have been recorded in the chart.  Hearing and vision has been addressed. I  have made referrals, counseling and provided education to the patient based review of the above and I have provided the pt with a written personalized care plan for preventive services. Provider list updated - see scanned questionairre. Advanced directives discussed: husband is HCPOA. Advanced directives in chart.  Reviewed preventative protocols and updated unless pt declined.    NAFLD (nonalcoholic fatty liver disease)     Stable. Reviewed dx with patient.    Osteopenia     Continue calcium and vit d.    Prediabetes     Reviewed #s with patient, continue to encourage weight loss and avoiding added sugars.     Other Visit Diagnoses   Immunization due        Relevant Orders       Pneumococcal conjugate vaccine 13-valent (Completed)        Follow up plan: Return in about 1 year (around 07/01/2015), or as needed, for medicare wellness.

## 2014-06-30 NOTE — Assessment & Plan Note (Signed)
I have personally reviewed the Medicare Annual Wellness questionnaire and have noted 1. The patient's medical and social history 2. Their use of alcohol, tobacco or illicit drugs 3. Their current medications and supplements 4. The patient's functional ability including ADL's, fall risks, home safety risks and hearing or visual impairment. 5. Diet and physical activity 6. Evidence for depression or mood disorders The patients weight, height, BMI have been recorded in the chart.  Hearing and vision has been addressed. I have made referrals, counseling and provided education to the patient based review of the above and I have provided the pt with a written personalized care plan for preventive services. Provider list updated - see scanned questionairre. Advanced directives discussed: husband is HCPOA. Advanced directives in chart.  Reviewed preventative protocols and updated unless pt declined.

## 2014-06-30 NOTE — Assessment & Plan Note (Signed)
Some elevated readings. Pt also experiencing dependent edema. Will add on hctz 12.5mg  qd prn elevated bp or leg swelling.  Pt agrees with plan. Discussed continued elevation of legs.

## 2014-06-30 NOTE — Patient Instructions (Signed)
May add diuretic hydrochlorothiazide 12.5mg  daily as needed for elevated blood pressures or leg swelling. Return in mid September for flu shot nurse visit. Prevnar today. Schedule mammogram. Good to see you today, call us with quesitons.

## 2014-06-30 NOTE — Assessment & Plan Note (Signed)
Reviewed #s with patient, continue to encourage weight loss and avoiding added sugars.

## 2014-07-27 ENCOUNTER — Other Ambulatory Visit: Payer: Self-pay | Admitting: *Deleted

## 2014-07-27 MED ORDER — HYDROCHLOROTHIAZIDE 12.5 MG PO CAPS: 12.5000 mg | ORAL_CAPSULE | Freq: Every day | ORAL | Status: DC | PRN

## 2014-08-02 ENCOUNTER — Ambulatory Visit (INDEPENDENT_AMBULATORY_CARE_PROVIDER_SITE_OTHER): Payer: Medicare Other

## 2014-08-02 DIAGNOSIS — Z23 Encounter for immunization: Secondary | ICD-10-CM

## 2014-08-03 DIAGNOSIS — N6310 Unspecified lump in the right breast, unspecified quadrant: Secondary | ICD-10-CM

## 2014-08-03 HISTORY — DX: Unspecified lump in the right breast, unspecified quadrant: N63.10

## 2014-08-03 HISTORY — PX: BREAST BIOPSY: SHX20

## 2014-08-09 ENCOUNTER — Ambulatory Visit: Payer: Self-pay | Admitting: Family Medicine

## 2014-08-09 DIAGNOSIS — Z1231 Encounter for screening mammogram for malignant neoplasm of breast: Secondary | ICD-10-CM | POA: Diagnosis not present

## 2014-08-09 DIAGNOSIS — R922 Inconclusive mammogram: Secondary | ICD-10-CM | POA: Diagnosis not present

## 2014-08-10 ENCOUNTER — Encounter: Payer: Self-pay | Admitting: Family Medicine

## 2014-08-11 ENCOUNTER — Ambulatory Visit: Payer: Self-pay | Admitting: Family Medicine

## 2014-08-11 DIAGNOSIS — N63 Unspecified lump in breast: Secondary | ICD-10-CM | POA: Diagnosis not present

## 2014-08-14 ENCOUNTER — Encounter: Payer: Self-pay | Admitting: Family Medicine

## 2014-08-16 ENCOUNTER — Encounter: Payer: Self-pay | Admitting: Family Medicine

## 2014-08-16 ENCOUNTER — Ambulatory Visit: Payer: Self-pay | Admitting: Family Medicine

## 2014-08-16 DIAGNOSIS — N63 Unspecified lump in breast: Secondary | ICD-10-CM | POA: Diagnosis not present

## 2014-08-16 DIAGNOSIS — N6021 Fibroadenosis of right breast: Secondary | ICD-10-CM | POA: Diagnosis not present

## 2014-08-17 ENCOUNTER — Encounter: Payer: Self-pay | Admitting: Family Medicine

## 2014-08-17 LAB — PATHOLOGY REPORT

## 2014-08-18 ENCOUNTER — Encounter: Payer: Self-pay | Admitting: Family Medicine

## 2014-08-21 ENCOUNTER — Encounter: Payer: Self-pay | Admitting: Family Medicine

## 2014-09-26 ENCOUNTER — Ambulatory Visit (INDEPENDENT_AMBULATORY_CARE_PROVIDER_SITE_OTHER): Payer: Medicare Other | Admitting: Family Medicine

## 2014-09-26 ENCOUNTER — Encounter: Payer: Self-pay | Admitting: Family Medicine

## 2014-09-26 VITALS — BP 140/84 | HR 86 | Temp 98.2°F | Wt 175.5 lb

## 2014-09-26 DIAGNOSIS — B9689 Other specified bacterial agents as the cause of diseases classified elsewhere: Secondary | ICD-10-CM | POA: Insufficient documentation

## 2014-09-26 DIAGNOSIS — R0789 Other chest pain: Secondary | ICD-10-CM | POA: Diagnosis not present

## 2014-09-26 DIAGNOSIS — J209 Acute bronchitis, unspecified: Secondary | ICD-10-CM

## 2014-09-26 DIAGNOSIS — J208 Acute bronchitis due to other specified organisms: Secondary | ICD-10-CM

## 2014-09-26 MED ORDER — GUAIFENESIN-CODEINE 100-10 MG/5ML PO SYRP: 5.0000 mL | ORAL_SOLUTION | Freq: Two times a day (BID) | ORAL | Status: DC | PRN

## 2014-09-26 MED ORDER — DOXYCYCLINE HYCLATE 100 MG PO CAPS: 100.0000 mg | ORAL_CAPSULE | Freq: Two times a day (BID) | ORAL | Status: DC

## 2014-09-26 NOTE — Progress Notes (Signed)
BP 140/84 mmHg  Pulse 86  Temp(Src) 98.2 F (36.8 C) (Oral)  Wt 175 lb 8 oz (79.606 kg)  SpO2 97%   CC: URI  Subjective:    Patient ID: Katherine Oliver, female    DOB: June 11, 1946, 68 y.o.   MRN: [id number]  HPI: Katherine Oliver is a 68 y.o. female presenting on 09/26/2014 for Bronchitis   Recent trip to [place]   Several day h/o low grade fever, congestion in head and chest, sore throat, dry cough with chest pressure (at lower chest) when coughing, +PNDRainage. No rattling in chest. Cough keeping her awake at night some.  No ear or tooth pain, headaches Some pedal edema with recent drive (she did take several breaks during car ride).  No sick contacts. Yesterday started taking mucinex DM and ibuprofen. No smokers at home. No h/o asthma.  Stress cardiolite 2005 WNL. Noticing increased dyspnea on exertion over last few weeks. No chest pressure/tightness prior to congestion starting 2 days ago.  Relevant past medical, surgical, family and social history reviewed and updated as indicated.  Allergies and medications reviewed and updated. Current Outpatient Prescriptions on File Prior to Visit  Medication Sig  . amLODipine (NORVASC) 10 MG tablet Take 1 tablet (10 mg total) by mouth daily.  Marland Kitchen aspirin 81 MG tablet Take 81 mg by mouth daily.  . benazepril (LOTENSIN) 20 MG tablet Take 1 tablet by mouth  daily  . Calcium Carbonate-Vitamin D (CALCIUM 500/VITAMIN D PO) Take 2 tablets by mouth daily.  . diphenhydrAMINE (BENADRYL) 25 MG tablet Take 25 mg by mouth at bedtime as needed.  . fenofibrate (TRICOR) 145 MG tablet Take 1 tablet by mouth  daily  . fish oil-omega-3 fatty acids 1000 MG capsule Take 3 g by mouth daily.   . fluticasone (FLONASE) 50 MCG/ACT nasal spray Place 2 sprays into both nostrils daily. As needed  . hydrochlorothiazide (MICROZIDE) 12.5 MG capsule Take 1 capsule (12.5 mg total) by mouth daily as needed.  Marland Kitchen ibuprofen (ADVIL,MOTRIN) 200 MG  tablet Take 400 mg by mouth as needed.  . Multiple Vitamin (MULTIVITAMIN) tablet Take 1 tablet by mouth daily.  . rosuvastatin (CRESTOR) 5 MG tablet Take 1 tablet by mouth at   bedtime   No current facility-administered medications on file prior to visit.   Past Medical History  Diagnosis Date  . Prediabetes   . HTN (hypertension)   . HLD (hyperlipidemia)   . Hepatic steatosis     by abd [place] 05/2012, mild transaminitis - normal iron sat and viral hep panel (2011)  . History of recurrent UTIs     on chronic keflex  . 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 Arthritis   . Allergic rhinitis   . History of chicken pox   . History of measles   . Rosacea     metrogel  . GERD (gastroesophageal reflux disease)   . Polycythemia     mild, stable (2013)  . Osteopenia 06/2013    mild, forearm T -1.1, hip and spine WNL  . Lung nodules 11/2013    nonspecific, rec rpt 6-12 mo  . Hypertensive retinopathy of both eyes, 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    Bulakowski  . Breast mass, right 08/2014    biopsy benign - [facility]    Past Surgical History  Procedure Laterality Date  . Appendectomy  1987  . Vaginal hysterectomy  1984    for menorrhagia, ovaries in place  .  Cholecystectomy  2003  . Dexa  2003    normal  . Cardiolite stress test  04/2004    normal  . Cesarean section  [id number]    x2  . Colonoscopy  09/26/2008    adenomatous polyp, rpt 3-5 yrs  . Trigger finger release  2007;2010;2011    bilateral  . Breast biopsy  1963  . Colonoscopy  08/2012    adenomatous polyps, diverticulosis, rec rpt 5 yrs [name])  . Dexa  06/2013    [facility] - Tscore -1.1 forearm, normal spine and femur    Review of Systems Per HPI unless specifically indicated above    Objective:    BP 140/84 mmHg  Pulse 86  Temp(Src) 98.2 F (36.8 C) (Oral)  Wt 175 lb 8 oz (79.606 kg)  SpO2 97%  Physical Exam  Constitutional: She appears well-developed and well-nourished. No distress.  HENT:  Head:  Normocephalic and atraumatic.  Right Ear: Hearing, tympanic membrane, external ear and ear canal normal.  Left Ear: Hearing, tympanic membrane, external ear and ear canal normal.  Nose: Mucosal edema present. No rhinorrhea. Right sinus exhibits no maxillary sinus tenderness and no frontal sinus tenderness. Left sinus exhibits no maxillary sinus tenderness and no frontal sinus tenderness.  Mouth/Throat: Uvula is midline, oropharynx is clear and moist and mucous membranes are normal. No oropharyngeal exudate, posterior oropharyngeal edema, posterior oropharyngeal erythema or tonsillar abscesses.  Eyes: Conjunctivae and EOM are normal. Pupils are equal, round, and reactive to light. No scleral icterus.  Neck: Normal range of motion. Neck supple.  Cardiovascular: Normal rate, regular rhythm, normal heart sounds and intact distal pulses.   No murmur heard. Pulmonary/Chest: Effort normal and breath sounds normal. No respiratory distress. She has no wheezes. She has no rales.  Musculoskeletal: She exhibits no edema.  No calf pain or palpable cords  Lymphadenopathy:    She has no cervical adenopathy.  Skin: Skin is warm and dry. No rash noted.  Nursing note and vitals reviewed.      Assessment & Plan:   Problem List Items Addressed This Visit    Acute bronchitis - Primary    Anticipate acute bronchitis - treat aggressively with 10d doxycycline course. For chest pressure endorsed - checked EKG today overall ok. Cheratussin for cough at night time - pt has tolerated well in past. Doubt cardiac etiology of chest pressure, not consistent with PE either. Discussed in detail reasons to seek urgent care or return for further evaluation. Pt agrees with plan  EKG today - NSR rate 70s, LAD, normal intervals, no acute ST/T changes. good R wave progression. no old to compare.     Other Visit Diagnoses    Chest pressure        Relevant Orders       EKG 12-Lead (Completed)        Follow up  plan: Return if symptoms worsen or fail to improve.

## 2014-09-26 NOTE — Patient Instructions (Signed)
EKG today. I think you do have developing bronchitis. Treat with antibiotic sent to pharmacy and cheratussin for cough at night time. Push fluids and plenty of rest.  Ok to conitnue mucinex DM during the day. Let us know if persistent or worsening chest tightness or shortness of breath, if worsening fever, or worsening cough - or if not improving as expected.

## 2014-09-26 NOTE — Assessment & Plan Note (Addendum)
Anticipate acute bronchitis - treat aggressively with 10d doxycycline course. For chest pressure endorsed - checked EKG today overall ok. Cheratussin for cough at night time - pt has tolerated well in past. Doubt cardiac etiology of chest pressure, not consistent with PE either. Discussed in detail reasons to seek urgent care or return for further evaluation. Pt agrees with plan  EKG today - NSR rate 70s, LAD, normal intervals, no acute ST/T changes. good R wave progression. no old to compare.

## 2014-09-26 NOTE — Progress Notes (Signed)
Pre visit review using our clinic review tool, if applicable. No additional management support is needed unless otherwise documented below in the visit note. 

## 2015-02-01 ENCOUNTER — Telehealth: Payer: Self-pay | Admitting: Family Medicine

## 2015-02-01 DIAGNOSIS — N6489 Other specified disorders of breast: Secondary | ICD-10-CM

## 2015-02-01 NOTE — Telephone Encounter (Signed)
Pt called stating it is time for an ultra sound of right breast.  Dr Pamelia Hoit recommended this last year.  Pt stated she called [facility] to make appointment They need a referral for this  Please advise Need order  She can go any day except 02/20/15

## 2015-02-04 DIAGNOSIS — N6489 Other specified disorders of breast: Secondary | ICD-10-CM | POA: Insufficient documentation

## 2015-02-04 NOTE — Telephone Encounter (Signed)
Referral placed.

## 2015-02-05 NOTE — Telephone Encounter (Signed)
Appt made on 02/21/15 at 10:20am, patient notified.

## 2015-02-21 ENCOUNTER — Ambulatory Visit
Admit: 2015-02-21 | Disposition: A | Discharge status: Home / Self Care | Payer: Self-pay | Attending: Family Medicine | Admitting: Family Medicine

## 2015-02-21 DIAGNOSIS — N63 Unspecified lump in breast: Secondary | ICD-10-CM | POA: Diagnosis not present

## 2015-06-04 LAB — HM DIABETES EYE EXAM

## 2015-06-07 DIAGNOSIS — H524 Presbyopia: Secondary | ICD-10-CM | POA: Diagnosis not present

## 2015-06-07 DIAGNOSIS — H35033 Hypertensive retinopathy, bilateral: Secondary | ICD-10-CM | POA: Diagnosis not present

## 2015-06-07 DIAGNOSIS — H2513 Age-related nuclear cataract, bilateral: Secondary | ICD-10-CM | POA: Diagnosis not present

## 2015-06-07 LAB — HM DIABETES EYE EXAM

## 2015-06-19 ENCOUNTER — Ambulatory Visit (INDEPENDENT_AMBULATORY_CARE_PROVIDER_SITE_OTHER): Payer: Medicare Other | Admitting: Primary Care

## 2015-06-19 ENCOUNTER — Encounter: Payer: Self-pay | Admitting: Primary Care

## 2015-06-19 VITALS — BP 136/80 | HR 83 | Temp 97.6°F | Ht 61.0 in | Wt 180.8 lb

## 2015-06-19 DIAGNOSIS — N949 Unspecified condition associated with female genital organs and menstrual cycle: Secondary | ICD-10-CM | POA: Diagnosis not present

## 2015-06-19 DIAGNOSIS — R509 Fever, unspecified: Secondary | ICD-10-CM | POA: Diagnosis not present

## 2015-06-19 LAB — POCT URINALYSIS DIPSTICK
Bilirubin, UA: NEGATIVE
Blood, UA: NEGATIVE
Glucose, UA: NEGATIVE
Ketones, UA: NEGATIVE
LEUKOCYTES UA: NEGATIVE
NITRITE UA: NEGATIVE
PROTEIN UA: NEGATIVE
Spec Grav, UA: 1.01
Urobilinogen, UA: NEGATIVE
pH, UA: 6.5

## 2015-06-19 NOTE — Patient Instructions (Addendum)
I will notify you of your vaginal specimen results. Your urine does not show an infection.  Ensure that you are drinking plenty of water to stay hydrated.  It was a pleasure meeting you!

## 2015-06-19 NOTE — Progress Notes (Signed)
Pre visit review using our clinic review tool, if applicable. No additional management support is needed unless otherwise documented below in the visit note. 

## 2015-06-19 NOTE — Progress Notes (Signed)
Subjective:    Patient ID: Katherine Oliver, female    DOB: 1946-05-21, 69 y.o.   MRN: [id number]  HPI  Katherine Oliver is a 69 year old female who presents today with a chief complaint of lower pelvic pressure. She also reports dysuria, urinary frequency, chills, low grade fever on Sunday. She denies vaginal discharge, bleeding, itching. Her symptoms began Sunday afternoon. She has been using monostat on Sunday and Monday this week without relief. Overall her symptoms have not improved and have not become worse.  Review of Systems  Constitutional: Positive for fever, chills and fatigue.  Respiratory: Negative for shortness of breath.   Cardiovascular: Negative for chest pain.  Genitourinary: Positive for dysuria, urgency and frequency. Negative for vaginal discharge.       Pelvic discomfort  Musculoskeletal: Negative for myalgias.       Past Medical History  Diagnosis Date  . Prediabetes   . HTN (hypertension)   . HLD (hyperlipidemia)   . Hepatic steatosis     by abd [place] 05/2012, mild transaminitis - normal iron sat and viral hep panel (2011)  . History of recurrent UTIs     on chronic keflex  . 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 Arthritis   . Allergic rhinitis   . History of chicken pox   . History of measles   . Rosacea     metrogel  . GERD (gastroesophageal reflux disease)   . Polycythemia     mild, stable (2013)  . Osteopenia 06/2013    mild, forearm T -1.1, hip and spine WNL  . Lung nodules 11/2013    nonspecific, rec rpt 6-12 mo  . Hypertensive retinopathy of both eyes, 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    Bulakowski  . Breast mass, right 08/2014    biopsy benign - [facility]    Social History   Social History  . Marital Status: Married    Spouse Name: N/A  . Number of Children: N/A  . Years of Education: N/A   Occupational History  . Not on file.   Social History Main Topics  . Smoking status: Never Smoker   . Smokeless tobacco: Never Used    . Alcohol Use: Yes     Comment: Occasional  . Drug Use: No  . Sexual Activity: Not on file   Other Topics Concern  . 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 Daily walking about 20 min.    Diet: ok water intake 4 glasses/day, daily fruits/vegetables, red meat 4x/wk, fish 3-4x/wk      Advanced directive: HCPOA is husband Neal Romanchuk    Past Surgical History  Procedure Laterality Date  . Appendectomy  1987  . Vaginal hysterectomy  1984    for menorrhagia, ovaries in place  . Cholecystectomy  2003  . Dexa  2003    normal  . Cardiolite stress test  04/2004    normal  . Cesarean section  [id number]    x2  . Colonoscopy  09/26/2008    adenomatous polyp, rpt 3-5 yrs  . Trigger finger release  2007;2010;2011    bilateral  . Breast biopsy  1963  . Colonoscopy  08/2012    adenomatous polyps, diverticulosis, rec rpt 5  yrs [name])  . Dexa  06/2013    [facility] - Tscore -1.1 forearm, normal spine and femur    Family History  Problem Relation Age of Onset  . Stroke Mother     several  . Hyperlipidemia Mother   . Hypertension Mother   . Cancer Father     colon  . Hypertension Father   . Hyperlipidemia Father   . Cancer Paternal Aunt     abdominal  . Coronary artery disease Maternal Grandmother   . Diabetes Maternal Grandfather   . Coronary artery disease Maternal Grandfather   . Coronary artery disease Father 63    MIx1, CABG    Allergies  Allergen Reactions  . Sulfa Antibiotics   . Adhesive [Tape] Rash    Paper tape - blisters    Current Outpatient Prescriptions on File Prior to Visit  Medication Sig Dispense Refill  . amLODipine (NORVASC) 10 MG tablet Take 1 tablet (10 mg total) by mouth daily. 90 tablet 3  . aspirin 81 MG tablet Take 81 mg  by mouth daily.    . benazepril (LOTENSIN) 20 MG tablet Take 1 tablet by mouth  daily 90 tablet 3  . Calcium Carbonate-Vitamin D (CALCIUM 500/VITAMIN D PO) Take 2 tablets by mouth daily.    . diphenhydrAMINE (BENADRYL) 25 MG tablet Take 25 mg by mouth at bedtime as needed.    . fenofibrate (TRICOR) 145 MG tablet Take 1 tablet by mouth  daily 90 tablet 3  . fish oil-omega-3 fatty acids 1000 MG capsule Take 3 g by mouth daily.     . fluticasone (FLONASE) 50 MCG/ACT nasal spray Place 2 sprays into both nostrils daily. As needed 48 g 3  . hydrochlorothiazide (MICROZIDE) 12.5 MG capsule Take 1 capsule (12.5 mg total) by mouth daily as needed. 90 capsule 0  . Multiple Vitamin (MULTIVITAMIN) tablet Take 1 tablet by mouth daily.    . rosuvastatin (CRESTOR) 5 MG tablet Take 1 tablet by mouth at   bedtime 90 tablet 3   No current facility-administered medications on file prior to visit.    BP 136/80 mmHg  Pulse 83  Temp(Src) 97.6 F (36.4 C) (Oral)  Ht 5\' 1"  (1.549 m)  Wt 180 lb 12.8 oz (82.01 kg)  BMI 34.18 kg/m2  SpO2 94%    Objective:   Physical Exam  Constitutional: She appears well-nourished.  Cardiovascular: Normal rate and regular rhythm.   Pulmonary/Chest: Effort normal and breath sounds normal.  Abdominal: Soft. Bowel sounds are normal.  Tenderness to bilateral groin/pelvic region  Skin: Skin is warm and dry.  Vitals reviewed.         Assessment & Plan:  Pelvic pressure:  UA: Negative for leuks, blood, protein, nitrites. Pelvic exam completed today. Mild whitish discharge noted. No CMT or adnexa tenderness. Will send off wet prep. Push fluids.  Will updated patient once results are back.

## 2015-06-20 LAB — WET PREP BY MOLECULAR PROBE
Candida species: NEGATIVE
GARDNERELLA VAGINALIS: NEGATIVE
TRICHOMONAS VAG: NEGATIVE

## 2015-06-21 ENCOUNTER — Encounter: Payer: Self-pay | Admitting: Family Medicine

## 2015-06-30 ENCOUNTER — Other Ambulatory Visit: Payer: Self-pay | Admitting: Family Medicine

## 2015-06-30 DIAGNOSIS — E785 Hyperlipidemia, unspecified: Secondary | ICD-10-CM

## 2015-06-30 DIAGNOSIS — K760 Fatty (change of) liver, not elsewhere classified: Secondary | ICD-10-CM

## 2015-06-30 DIAGNOSIS — R7303 Prediabetes: Secondary | ICD-10-CM

## 2015-06-30 DIAGNOSIS — D751 Secondary polycythemia: Secondary | ICD-10-CM

## 2015-06-30 DIAGNOSIS — I10 Essential (primary) hypertension: Secondary | ICD-10-CM

## 2015-07-02 ENCOUNTER — Other Ambulatory Visit (INDEPENDENT_AMBULATORY_CARE_PROVIDER_SITE_OTHER): Payer: Medicare Other

## 2015-07-02 DIAGNOSIS — R7309 Other abnormal glucose: Secondary | ICD-10-CM | POA: Diagnosis not present

## 2015-07-02 DIAGNOSIS — K760 Fatty (change of) liver, not elsewhere classified: Secondary | ICD-10-CM | POA: Diagnosis not present

## 2015-07-02 DIAGNOSIS — D751 Secondary polycythemia: Secondary | ICD-10-CM | POA: Diagnosis not present

## 2015-07-02 DIAGNOSIS — I10 Essential (primary) hypertension: Secondary | ICD-10-CM | POA: Diagnosis not present

## 2015-07-02 DIAGNOSIS — R7303 Prediabetes: Secondary | ICD-10-CM

## 2015-07-02 DIAGNOSIS — E785 Hyperlipidemia, unspecified: Secondary | ICD-10-CM | POA: Diagnosis not present

## 2015-07-02 LAB — CBC WITH DIFFERENTIAL/PLATELET
Basophils Absolute: 0 10*3/uL (ref 0.0–0.1)
Basophils Relative: 0.7 % (ref 0.0–3.0)
EOS PCT: 0 % (ref 0.0–5.0)
Eosinophils Absolute: 0 10*3/uL (ref 0.0–0.7)
HCT: 45.5 % (ref 36.0–46.0)
Hemoglobin: 15.2 g/dL — ABNORMAL HIGH (ref 12.0–15.0)
LYMPHS ABS: 1.5 10*3/uL (ref 0.7–4.0)
Lymphocytes Relative: 27.9 % (ref 12.0–46.0)
MCHC: 33.5 g/dL (ref 30.0–36.0)
MCV: 83.3 fl (ref 78.0–100.0)
MONO ABS: 0.5 10*3/uL (ref 0.1–1.0)
MONOS PCT: 9 % (ref 3.0–12.0)
NEUTROS ABS: 3.3 10*3/uL (ref 1.4–7.7)
NEUTROS PCT: 62.4 % (ref 43.0–77.0)
PLATELETS: 210 10*3/uL (ref 150.0–400.0)
RBC: 5.46 Mil/uL — AB (ref 3.87–5.11)
RDW: 13.9 % (ref 11.5–15.5)
WBC: 5.2 10*3/uL (ref 4.0–10.5)

## 2015-07-02 LAB — LIPID PANEL
CHOLESTEROL: 150 mg/dL (ref 0–200)
HDL: 46.6 mg/dL (ref >39.00)
LDL Cholesterol: 70 mg/dL (ref 0–99)
NONHDL: 103.02
Total CHOL/HDL Ratio: 3
Triglycerides: 166 mg/dL — ABNORMAL HIGH (ref 0.0–149.0)
VLDL: 33.2 mg/dL (ref 0.0–40.0)

## 2015-07-02 LAB — COMPREHENSIVE METABOLIC PANEL
ALBUMIN: 4.5 g/dL (ref 3.5–5.2)
ALK PHOS: 51 U/L (ref 39–117)
ALT: 94 U/L — ABNORMAL HIGH (ref 0–35)
AST: 45 U/L — AB (ref 0–37)
BUN: 23 mg/dL (ref 6–23)
CHLORIDE: 105 meq/L (ref 96–112)
CO2: 28 mEq/L (ref 19–32)
CREATININE: 1.02 mg/dL (ref 0.40–1.20)
Calcium: 10.2 mg/dL (ref 8.4–10.5)
GFR: 56.8 mL/min — ABNORMAL LOW (ref >60.00)
GLUCOSE: 138 mg/dL — AB (ref 70–99)
POTASSIUM: 4.8 meq/L (ref 3.5–5.1)
SODIUM: 141 meq/L (ref 135–145)
TOTAL PROTEIN: 7.4 g/dL (ref 6.0–8.3)
Total Bilirubin: 0.5 mg/dL (ref 0.2–1.2)

## 2015-07-02 LAB — HEMOGLOBIN A1C: HEMOGLOBIN A1C: 6.5 % (ref 4.6–6.5)

## 2015-07-05 ENCOUNTER — Ambulatory Visit (INDEPENDENT_AMBULATORY_CARE_PROVIDER_SITE_OTHER): Payer: Medicare Other | Admitting: Family Medicine

## 2015-07-05 ENCOUNTER — Encounter: Payer: Self-pay | Admitting: Family Medicine

## 2015-07-05 ENCOUNTER — Encounter: Payer: Medicare Other | Admitting: Family Medicine

## 2015-07-05 VITALS — BP 136/84 | HR 84 | Temp 98.3°F | Ht 61.0 in | Wt 179.5 lb

## 2015-07-05 DIAGNOSIS — E669 Obesity, unspecified: Secondary | ICD-10-CM | POA: Insufficient documentation

## 2015-07-05 DIAGNOSIS — E119 Type 2 diabetes mellitus without complications: Secondary | ICD-10-CM

## 2015-07-05 DIAGNOSIS — Z7189 Other specified counseling: Secondary | ICD-10-CM

## 2015-07-05 DIAGNOSIS — Z23 Encounter for immunization: Secondary | ICD-10-CM | POA: Diagnosis not present

## 2015-07-05 DIAGNOSIS — K760 Fatty (change of) liver, not elsewhere classified: Secondary | ICD-10-CM

## 2015-07-05 DIAGNOSIS — M8580 Other specified disorders of bone density and structure, unspecified site: Secondary | ICD-10-CM

## 2015-07-05 DIAGNOSIS — E785 Hyperlipidemia, unspecified: Secondary | ICD-10-CM | POA: Diagnosis not present

## 2015-07-05 DIAGNOSIS — E66811 Obesity, class 1: Secondary | ICD-10-CM

## 2015-07-05 DIAGNOSIS — D751 Secondary polycythemia: Secondary | ICD-10-CM

## 2015-07-05 DIAGNOSIS — E663 Overweight: Secondary | ICD-10-CM | POA: Insufficient documentation

## 2015-07-05 DIAGNOSIS — I10 Essential (primary) hypertension: Secondary | ICD-10-CM

## 2015-07-05 DIAGNOSIS — Z0000 Encounter for general adult medical examination without abnormal findings: Secondary | ICD-10-CM

## 2015-07-05 DIAGNOSIS — Z1283 Encounter for screening for malignant neoplasm of skin: Secondary | ICD-10-CM

## 2015-07-05 MED ORDER — HYDROCHLOROTHIAZIDE 12.5 MG PO CAPS: 12.5000 mg | ORAL_CAPSULE | Freq: Every day | ORAL | Status: DC

## 2015-07-05 MED ORDER — ROSUVASTATIN CALCIUM 5 MG PO TABS: ORAL_TABLET | ORAL | Status: DC

## 2015-07-05 MED ORDER — FENOFIBRATE 145 MG PO TABS: ORAL_TABLET | ORAL | Status: DC

## 2015-07-05 MED ORDER — AMLODIPINE BESYLATE 10 MG PO TABS: 10.0000 mg | ORAL_TABLET | Freq: Every day | ORAL | Status: DC

## 2015-07-05 MED ORDER — BENAZEPRIL HCL 20 MG PO TABS: ORAL_TABLET | ORAL | Status: DC

## 2015-07-05 MED ORDER — FLUTICASONE PROPIONATE 50 MCG/ACT NA SUSP: 2.0000 | Freq: Every day | NASAL | Status: DC

## 2015-07-05 NOTE — Assessment & Plan Note (Signed)
Chronic, stable. Continue fenofibrate and crestor.

## 2015-07-05 NOTE — Assessment & Plan Note (Signed)
Reviewed trend for increased A1c over last several years - will focus on weight loss and healthy low carb diet.

## 2015-07-05 NOTE — Patient Instructions (Addendum)
Call to schedule mammogram for next month (you should receive a letter this month from [facility]). Let us know if that doesn't happen.  Start hydrochlorothiazide 12.5mg  daily. This should help blood pressure. Return in 6 months for follow up visit and prior fasting labs. Flu shot today.  Health Maintenance Adopting a healthy lifestyle and getting preventive care can go a long way to promote health and wellness. Talk with your health care provider about what schedule of regular examinations is right for you. This is a good chance for you to check in with your provider about disease prevention and staying healthy. In between checkups, there are plenty of things you can do on your own. Experts have done a lot of research about which lifestyle changes and preventive measures are most likely to keep you healthy. Ask your health care provider for more information. WEIGHT AND DIET  Eat a healthy diet  Be sure to include plenty of vegetables, fruits, low-fat dairy products, and lean protein.  Do not eat a lot of foods high in solid fats, added sugars, or salt.  Get regular exercise. This is one of the most important things you can do for your health.  Most adults should exercise for at least 150 minutes each week. The exercise should increase your heart rate and make you sweat (moderate-intensity exercise).  Most adults should also do strengthening exercises at least twice a week. This is in addition to the moderate-intensity exercise.  Maintain a healthy weight  Body mass index (BMI) is a measurement that can be used to identify possible weight problems. It estimates body fat based on height and weight. Your health care provider can help determine your BMI and help you achieve or maintain a healthy weight.  For females 21 years of age and older:   A BMI below 18.5 is considered underweight.  A BMI of 18.5 to 24.9 is normal.  A BMI of 25 to 29.9 is considered overweight.  A BMI of 30 and  above is considered obese.  Watch levels of cholesterol and blood lipids  You should start having your blood tested for lipids and cholesterol at 71 years of age, then have this test every 5 years.  You may need to have your cholesterol levels checked more often if:  Your lipid or cholesterol levels are high.  You are older than 71 years of age.  You are at high risk for heart disease.  CANCER SCREENING   Lung Cancer  Lung cancer screening is recommended for adults 34-73 years old who are at high risk for lung cancer because of a history of smoking.  A yearly low-dose CT scan of the lungs is recommended for people who:  Currently smoke.  Have quit within the past 15 years.  Have at least a 30-pack-year history of smoking. A pack year is smoking an average of one pack of cigarettes a day for 1 year.  Yearly screening should continue until it has been 15 years since you quit.  Yearly screening should stop if you develop a health problem that would prevent you from having lung cancer treatment.  Breast Cancer  Practice breast self-awareness. This means understanding how your breasts normally appear and feel.  It also means doing regular breast self-exams. Let your health care provider know about any changes, no matter how small.  If you are in your 20s or 30s, you should have a clinical breast exam (CBE) by a health care provider every 1-3 years as part  as part of a regular health exam.  If you are 40 or older, have a CBE every year. Also consider having a breast X-ray (mammogram) every year.  If you have a family history of breast cancer, talk to your health care provider about genetic screening.  If you are at high risk for breast cancer, talk to your health care provider about having an MRI and a mammogram every year.  Breast cancer gene (BRCA) assessment is recommended for women who have family members with BRCA-related cancers. BRCA-related cancers  include:  Breast.  Ovarian.  Tubal.  Peritoneal cancers.  Results of the assessment will determine the need for genetic counseling and BRCA1 and BRCA2 testing. Cervical Cancer Routine pelvic examinations to screen for cervical cancer are no longer recommended for nonpregnant women who are considered low risk for cancer of the pelvic organs (ovaries, uterus, and vagina) and who do not have symptoms. A pelvic examination may be necessary if you have symptoms including those associated with pelvic infections. Ask your health care provider if a screening pelvic exam is right for you.   The Pap test is the screening test for cervical cancer for women who are considered at risk.  If you had a hysterectomy for a problem that was not cancer or a condition that could lead to cancer, then you no longer need Pap tests.  If you are older than 65 years, and you have had normal Pap tests for the past 10 years, you no longer need to have Pap tests.  If you have had past treatment for cervical cancer or a condition that could lead to cancer, you need Pap tests and screening for cancer for at least 20 years after your treatment.  If you no longer get a Pap test, assess your risk factors if they change (such as having a new sexual partner). This can affect whether you should start being screened again.  Some women have medical problems that increase their chance of getting cervical cancer. If this is the case for you, your health care provider may recommend more frequent screening and Pap tests.  The human papillomavirus (HPV) test is another test that may be used for cervical cancer screening. The HPV test looks for the virus that can cause cell changes in the cervix. The cells collected during the Pap test can be tested for HPV.  The HPV test can be used to screen women 30 years of age and older. Getting tested for HPV can extend the interval between normal Pap tests from three to five years.  An HPV  test also should be used to screen women of any age who have unclear Pap test results.  After 71 years of age, women should have HPV testing as often as Pap tests.  Colorectal Cancer  This type of cancer can be detected and often prevented.  Routine colorectal cancer screening usually begins at 71 years of age and continues through 71 years of age.  Your health care provider may recommend screening at an earlier age if you have risk factors for colon cancer.  Your health care provider may also recommend using home test kits to check for hidden blood in the stool.  A small camera at the end of a tube can be used to examine your colon directly (sigmoidoscopy or colonoscopy). This is done to check for the earliest forms of colorectal cancer.  Routine screening usually begins at age 50.  Direct examination of the colon should be repeated every   5-10 years through 71 years of age. However, you may need to be screened more often if early forms of precancerous polyps or small growths are found. Skin Cancer  Check your skin from head to toe regularly.  Tell your health care provider about any new moles or changes in moles, especially if there is a change in a mole's shape or color.  Also tell your health care provider if you have a mole that is larger than the size of a pencil eraser.  Always use sunscreen. Apply sunscreen liberally and repeatedly throughout the day.  Protect yourself by wearing long sleeves, pants, a wide-brimmed hat, and sunglasses whenever you are outside. HEART DISEASE, DIABETES, AND HIGH BLOOD PRESSURE   Have your blood pressure checked at least every 1-2 years. High blood pressure causes heart disease and increases the risk of stroke.  If you are between 55 years and 79 years old, ask your health care provider if you should take aspirin to prevent strokes.  Have regular diabetes screenings. This involves taking a blood sample to check your fasting blood sugar  level.  If you are at a normal weight and have a low risk for diabetes, have this test once every three years after 71 years of age.  If you are overweight and have a high risk for diabetes, consider being tested at a younger age or more often. PREVENTING INFECTION  Hepatitis B  If you have a higher risk for hepatitis B, you should be screened for this virus. You are considered at high risk for hepatitis B if:  You were born in a country where hepatitis B is common. Ask your health care provider which countries are considered high risk.  Your parents were born in a high-risk country, and you have not been immunized against hepatitis B (hepatitis B vaccine).  You have HIV or AIDS.  You use needles to inject street drugs.  You live with someone who has hepatitis B.  You have had sex with someone who has hepatitis B.  You get hemodialysis treatment.  You take certain medicines for conditions, including cancer, organ transplantation, and autoimmune conditions. Hepatitis C  Blood testing is recommended for:  Everyone born from 1945 through 1965.  Anyone with known risk factors for hepatitis C. Sexually transmitted infections (STIs)  You should be screened for sexually transmitted infections (STIs) including gonorrhea and chlamydia if:  You are sexually active and are younger than 71 years of age.  You are older than 71 years of age and your health care provider tells you that you are at risk for this type of infection.  Your sexual activity has changed since you were last screened and you are at an increased risk for chlamydia or gonorrhea. Ask your health care provider if you are at risk.  If you do not have HIV, but are at risk, it may be recommended that you take a prescription medicine daily to prevent HIV infection. This is called pre-exposure prophylaxis (PrEP). You are considered at risk if:  You are sexually active and do not regularly use condoms or know the HIV status  of your partner(s).  You take drugs by injection.  You are sexually active with a partner who has HIV. Talk with your health care provider about whether you are at high risk of being infected with HIV. If you choose to begin PrEP, you should first be tested for HIV. You should then be tested every 3 months for as long as you   are taking PrEP.  PREGNANCY   If you are premenopausal and you may become pregnant, ask your health care provider about preconception counseling.  If you may become pregnant, take 400 to 800 micrograms (mcg) of folic acid every day.  If you want to prevent pregnancy, talk to your health care provider about birth control (contraception). OSTEOPOROSIS AND MENOPAUSE   Osteoporosis is a disease in which the bones lose minerals and strength with aging. This can result in serious bone fractures. Your risk for osteoporosis can be identified using a bone density scan.  If you are 65 years of age or older, or if you are at risk for osteoporosis and fractures, ask your health care provider if you should be screened.  Ask your health care provider whether you should take a calcium or vitamin D supplement to lower your risk for osteoporosis.  Menopause may have certain physical symptoms and risks.  Hormone replacement therapy may reduce some of these symptoms and risks. Talk to your health care provider about whether hormone replacement therapy is right for you.  HOME CARE INSTRUCTIONS   Schedule regular health, dental, and eye exams.  Stay current with your immunizations.   Do not use any tobacco products including cigarettes, chewing tobacco, or electronic cigarettes.  If you are pregnant, do not drink alcohol.  If you are breastfeeding, limit how much and how often you drink alcohol.  Limit alcohol intake to no more than 1 drink per day for nonpregnant women. One drink equals 12 ounces of beer, 5 ounces of wine, or 1 ounces of hard liquor.  Do not use street  drugs.  Do not share needles.  Ask your health care provider for help if you need support or information about quitting drugs.  Tell your health care provider if you often feel depressed.  Tell your health care provider if you have ever been abused or do not feel safe at home. Document Released: 05/05/2011 Document Revised: 03/06/2014 Document Reviewed: 09/21/2013 Exit[employer] Patient Information 2015 [employer]. This information is not intended to replace advice given to you by your health care provider. Make sure you discuss any questions you have with your health care provider.  

## 2015-07-05 NOTE — Assessment & Plan Note (Signed)
Discussed healthy diet and lifestyle changes to affect sustainable weight loss  

## 2015-07-05 NOTE — Assessment & Plan Note (Signed)
Slight bump in LFTs noted today - will continue to monitor Encouraged weight loss and better sugar control.

## 2015-07-05 NOTE — Assessment & Plan Note (Addendum)
Reviewed - stable but mildly elevated over several years.  Recent UA normal. Further eval next visit, consider CXR. Denies OSA sxs.

## 2015-07-05 NOTE — Assessment & Plan Note (Signed)
Chronic, stable. Pt endorses persistently elevated readings at home - will change HCTZ to daily.

## 2015-07-05 NOTE — Progress Notes (Signed)
BP 136/84 mmHg  Pulse 84  Temp(Src) 98.3 F (36.8 C) (Oral)  Ht 5\' 1"  (1.549 m)  Wt 179 lb 8 oz (81.421 kg)  BMI 33.93 kg/m2   CC: medicare wellness visit  Subjective:    Patient ID: Katherine Oliver, female    DOB: [birth year], 69 y.o.   MRN: [id number]  HPI: Katherine Oliver is a 69 y.o. female presenting on 07/05/2015 for Annual Exam   HTN - bp running 140/80s. Not taking hctz 12.5mg  regularly.  Hearing screening normal today.  Recent vision screen with eye clinic. No falls in last year.  Denies anger, depression, anhedonia, sadness.  Preventative: Colon 08/2012 - adenomatous polyps, diverticulosis, rec rpt 5 yrs [name]). Father with colon cancer age 23s.  Mammogram - 08/2014 abnormal - s/p normal core biopsy and f/u US [id number]/21016. Due for bilat screening mammo 08/2015.  Pap smear - stopped around 69yo, s/p hysterectomy for heavy bleeding. Both ovaries remain. Declines continued pelvic exam, discussed changes to monitor for. Dexa Date: 06/2013 [facility] - Tscore -1.1 forearm, normal spine and femur Flu shot  Pneumovax 05/2012, prevnar 2015 Td 2006  Shingles shot 2010  Advanced directives: husband [name] is [place]. In chart (05/2014). Seat belt use discussed Sunscreen use discussed. Changing spots on skin. Would like referral to derm.  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 Daily walking about 20 min.  Diet: ok water intake 4 glasses/day, daily fruits/vegetables, red meat 4x/wk, fish 3-4x/wk   Relevant past medical, surgical, family and social history reviewed and updated as indicated. Interim medical history since our last visit reviewed. Allergies and medications reviewed and updated. Current Outpatient Prescriptions on File Prior to Visit  Medication Sig  . aspirin 81 MG tablet Take 81 mg by  mouth daily.  . Calcium Carbonate-Vitamin D (CALCIUM 500/VITAMIN D PO) Take 2 tablets by mouth daily.  . diphenhydrAMINE (BENADRYL) 25 MG tablet Take 25 mg by mouth at bedtime as needed.  . fish oil-omega-3 fatty acids 1000 MG capsule Take 3 g by mouth daily.   . Multiple Vitamin (MULTIVITAMIN) tablet Take 1 tablet by mouth daily.  . naproxen sodium (ANAPROX) 220 MG tablet Take 220 mg by mouth as needed.   No current facility-administered medications on file prior to visit.    Review of Systems Per HPI unless specifically indicated above     Objective:    BP 136/84 mmHg  Pulse 84  Temp(Src) 98.3 F (36.8 C) (Oral)  Ht 5\' 1"  (1.549 m)  Wt 179 lb 8 oz (81.421 kg)  BMI 33.93 kg/m2  Wt Readings from Last 3 Encounters:  07/05/15 179 lb 8 oz (81.421 kg)  06/19/15 180 lb 12.8 oz (82.01 kg)  09/26/14 175 lb 8 oz (79.606 kg)    Physical Exam  Constitutional: She is oriented to person, place, and time. She appears well-developed and well-nourished. No distress.  HENT:  Head: Normocephalic and atraumatic.  Right Ear: Hearing, tympanic membrane, external ear and ear canal normal.  Left Ear: Hearing, tympanic membrane, external ear and ear canal normal.  Nose: Nose normal.  Mouth/Throat: Uvula is midline, oropharynx is clear and moist and mucous membranes are normal. No oropharyngeal exudate, posterior oropharyngeal edema or posterior oropharyngeal erythema.  Eyes: Conjunctivae and EOM are normal. Pupils are equal, round, and reactive to light. No scleral icterus.  Neck: Normal range of  motion. Neck supple. Carotid bruit is not present. No thyromegaly present.  Cardiovascular: Normal rate, regular rhythm, normal heart sounds and intact distal pulses.   No murmur heard. Pulses:      Radial pulses are 2+ on the right side, and 2+ on the left side.  Pulmonary/Chest: Effort normal and breath sounds normal. No respiratory distress. She has no wheezes. She has no rales.  Abdominal: Soft.  Bowel sounds are normal. She exhibits no distension and no mass. There is no tenderness. There is no rebound and no guarding.  Musculoskeletal: Normal range of motion. She exhibits no edema.  Lymphadenopathy:    She has no cervical adenopathy.  Neurological: She is alert and oriented to person, place, and time.  CN grossly intact, station and gait intact Recall 3/3  Skin: Skin is warm and dry. No rash noted.  Psychiatric: She has a normal mood and affect. Her behavior is normal. Judgment and thought content normal.  Nursing note and vitals reviewed.  Results for orders placed or performed in visit on 07/02/15  Lipid panel  Result Value Ref Range   Cholesterol 150 0 - 200 mg/dL   Triglycerides 166.0 (H) 0.0 - 149.0 mg/dL   HDL 46.60 >39.00 mg/dL   VLDL 33.2 0.0 - 40.0 mg/dL   LDL Cholesterol 70 0 - 99 mg/dL   Total CHOL/HDL Ratio 3    NonHDL 103.02   Comprehensive metabolic panel  Result Value Ref Range   Sodium 141 135 - 145 mEq/L   Potassium 4.8 3.5 - 5.1 mEq/L   Chloride 105 96 - 112 mEq/L   CO2 28 19 - 32 mEq/L   Glucose, Bld 138 (H) 70 - 99 mg/dL   BUN 23 6 - 23 mg/dL   Creatinine, Ser 1.02 0.40 - 1.20 mg/dL   Total Bilirubin 0.5 0.2 - 1.2 mg/dL   Alkaline Phosphatase 51 39 - 117 U/L   AST 45 (H) 0 - 37 U/L   ALT 94 (H) 0 - 35 U/L   Total Protein 7.4 6.0 - 8.3 g/dL   Albumin 4.5 3.5 - 5.2 g/dL   Calcium 10.2 8.4 - 10.5 mg/dL   GFR 56.80 (L) >60.00 mL/min  Hemoglobin A1c  Result Value Ref Range   Hgb A1c MFr Bld 6.5 4.6 - 6.5 %  CBC with Differential/Platelet  Result Value Ref Range   WBC 5.2 4.0 - 10.5 K/uL   RBC 5.46 (H) 3.87 - 5.11 Mil/uL   Hemoglobin 15.2 (H) 12.0 - 15.0 g/dL   HCT 45.5 36.0 - 46.0 %   MCV 83.3 78.0 - 100.0 fl   MCHC 33.5 30.0 - 36.0 g/dL   RDW 13.9 11.5 - 15.5 %   Platelets 210.0 150.0 - 400.0 K/uL   Neutrophils Relative % 62.4 43.0 - 77.0 %   Lymphocytes Relative 27.9 12.0 - 46.0 %   Monocytes Relative 9.0 3.0 - 12.0 %   Eosinophils  Relative 0.0 0.0 - 5.0 %   Basophils Relative 0.7 0.0 - 3.0 %   Neutro Abs 3.3 1.4 - 7.7 K/uL   Lymphs Abs 1.5 0.7 - 4.0 K/uL   Monocytes Absolute 0.5 0.1 - 1.0 K/uL   Eosinophils Absolute 0.0 0.0 - 0.7 K/uL   Basophils Absolute 0.0 0.0 - 0.1 K/uL      Assessment & Plan:   Problem List Items Addressed This Visit    Diet-controlled diabetes mellitus    Reviewed trend for increased A1c over last several years - will focus  on weight loss and healthy low carb diet.      Relevant Medications   benazepril (LOTENSIN) 20 MG tablet   rosuvastatin (CRESTOR) 5 MG tablet   HTN (hypertension)    Chronic, stable. Pt endorses persistently elevated readings at home - will change HCTZ to daily.      Relevant Medications   amLODipine (NORVASC) 10 MG tablet   benazepril (LOTENSIN) 20 MG tablet   fenofibrate (TRICOR) 145 MG tablet   rosuvastatin (CRESTOR) 5 MG tablet   hydrochlorothiazide (MICROZIDE) 12.5 MG capsule   HLD (hyperlipidemia)    Chronic, stable. Continue fenofibrate and crestor.      Relevant Medications   amLODipine (NORVASC) 10 MG tablet   benazepril (LOTENSIN) 20 MG tablet   fenofibrate (TRICOR) 145 MG tablet   rosuvastatin (CRESTOR) 5 MG tablet   hydrochlorothiazide (MICROZIDE) 12.5 MG capsule   NAFLD (nonalcoholic fatty liver disease)    Slight bump in LFTs noted today - will continue to monitor Encouraged weight loss and better sugar control.      Polycythemia    Reviewed - stable but mildly elevated over several years.  Recent UA normal. Further eval next visit, consider CXR. Denies OSA sxs.      Medicare annual wellness visit, subsequent - Primary    I have personally reviewed the Medicare Annual Wellness questionnaire and have noted 1. The patient's medical and social history 2. Their use of alcohol, tobacco or illicit drugs 3. Their current medications and supplements 4. The patient's functional ability including ADL's, fall risks, home safety risks and hearing  or visual impairment. Cognitive function has been assessed and addressed as indicated.  5. Diet and physical activity 6. Evidence for depression or mood disorders The patients weight, height, BMI have been recorded in the chart. I have made referrals, counseling and provided education to the patient based on review of the above and I have provided the pt with a written personalized care plan for preventive services. Provider list updated.. See scanned questionairre as needed for further documentation. Reviewed preventative protocols and updated unless pt declined.       Osteopenia    Continue cal/vit D daily.      Advanced care planning/counseling discussion    Advanced directives: husband [name] is [place]. In chart (05/2014).      Obesity, Class I, BMI 30-34.9    Discussed healthy diet and lifestyle changes to affect sustainable weight loss.       Other Visit Diagnoses    Skin exam, screening for cancer        Relevant Orders    Ambulatory referral to Dermatology        Follow up plan: Return in about 6 months (around 01/02/2016), or as needed, for follow up visit.

## 2015-07-05 NOTE — Progress Notes (Signed)
Pre visit review using our clinic review tool, if applicable. No additional management support is needed unless otherwise documented below in the visit note. 

## 2015-07-05 NOTE — Assessment & Plan Note (Signed)
Continue cal/vit D daily.

## 2015-07-05 NOTE — Assessment & Plan Note (Signed)
Advanced directives: husband [name] is [place]. In chart (05/2014).

## 2015-07-05 NOTE — Assessment & Plan Note (Signed)

## 2015-07-12 ENCOUNTER — Ambulatory Visit (INDEPENDENT_AMBULATORY_CARE_PROVIDER_SITE_OTHER): Payer: Medicare Other | Admitting: Family Medicine

## 2015-07-12 ENCOUNTER — Encounter: Payer: Self-pay | Admitting: Family Medicine

## 2015-07-12 VITALS — BP 128/78 | HR 88 | Temp 98.7°F | Wt 178.8 lb

## 2015-07-12 DIAGNOSIS — J029 Acute pharyngitis, unspecified: Secondary | ICD-10-CM | POA: Diagnosis not present

## 2015-07-12 NOTE — Assessment & Plan Note (Signed)
Likely allergic sore throat after exposure to known allergens when working in yard (high ragweed index). Less likely mild flu shot reaction, doubt viral infection. rec zyrtec trial (no improvement with alegra or claritin), start regular nasal saline and shower after allergen exposure. Update if no better, consider singulair.  Pt agrees with plan.

## 2015-07-12 NOTE — Patient Instructions (Addendum)
I think this is likely allergy response after exposure to outdoors. Shower after working in yard. Use nasal saline to wash away allergens. Try zyrtec. Continue salt water gargles. If no better with above, let me know to try singulair allergy medicine.

## 2015-07-12 NOTE — Progress Notes (Signed)
Pre visit review using our clinic review tool, if applicable. No additional management support is needed unless otherwise documented below in the visit note. 

## 2015-07-12 NOTE — Progress Notes (Signed)
BP 128/78 mmHg  Pulse 88  Temp(Src) 98.7 F (37.1 C) (Oral)  Wt 178 lb 12 oz (81.08 kg)   CC: sore throat/cough  Subjective:    Patient ID: Katherine Oliver, female    DOB: 04-20-1946, 69 y.o.   MRN: [id number]  HPI: Katherine Oliver is a 69 y.o. female presenting on 07/12/2015 for Sore Throat   Presents with husband today.  Seen here for medicare wellness visit 9/1 and flu shot given at that time. She also has been working out in yard - high ragweed allergies and she is allergic to this. Minimal dyspnea.   No fever. Sore scratchy throat, congestion in throat and head, occasional dry cough. Itchy watery eyes. No headaches, body aches. No ear or tooth pain.   Husband feels well. Currently only taking benadryl or chlor tabs nightly. Takes flonase daily. OTC antihistamines don't help.  Relevant past medical, surgical, family and social history reviewed and updated as indicated. Interim medical history since our last visit reviewed. Allergies and medications reviewed and updated. Current Outpatient Prescriptions on File Prior to Visit  Medication Sig  . amLODipine (NORVASC) 10 MG tablet Take 1 tablet (10 mg total) by mouth daily.  Marland Kitchen aspirin 81 MG tablet Take 81 mg by mouth daily.  . benazepril (LOTENSIN) 20 MG tablet Take 1 tablet by mouth  daily  . Calcium Carbonate-Vitamin D (CALCIUM 500/VITAMIN D PO) Take 2 tablets by mouth daily.  . diphenhydrAMINE (BENADRYL) 25 MG tablet Take 25 mg by mouth at bedtime as needed.  . fenofibrate (TRICOR) 145 MG tablet Take 1 tablet by mouth  daily  . fish oil-omega-3 fatty acids 1000 MG capsule Take 3 g by mouth daily.   . fluticasone (FLONASE) 50 MCG/ACT nasal spray Place 2 sprays into both nostrils daily. As needed  . hydrochlorothiazide (MICROZIDE) 12.5 MG capsule Take 1 capsule (12.5 mg total) by mouth daily.  . Multiple Vitamin (MULTIVITAMIN) tablet Take 1 tablet by mouth daily.  . naproxen sodium (ANAPROX) 220 MG tablet  Take 220 mg by mouth as needed.  . rosuvastatin (CRESTOR) 5 MG tablet Take 1 tablet by mouth at   bedtime   No current facility-administered medications on file prior to visit.    Review of Systems Per HPI unless specifically indicated above     Objective:    BP 128/78 mmHg  Pulse 88  Temp(Src) 98.7 F (37.1 C) (Oral)  Wt 178 lb 12 oz (81.08 kg)  Wt Readings from Last 3 Encounters:  07/12/15 178 lb 12 oz (81.08 kg)  07/05/15 179 lb 8 oz (81.421 kg)  06/19/15 180 lb 12.8 oz (82.01 kg)    Physical Exam  Constitutional: She appears well-developed and well-nourished. No distress.  HENT:  Head: Normocephalic and atraumatic.  Right Ear: Hearing, tympanic membrane, external ear and ear canal normal.  Left Ear: Hearing, tympanic membrane, external ear and ear canal normal.  Nose: No mucosal edema or rhinorrhea.  Mouth/Throat: Uvula is midline, oropharynx is clear and moist and mucous membranes are normal. No oropharyngeal exudate, posterior oropharyngeal edema, posterior oropharyngeal erythema or tonsillar abscesses.  Nasal mucosal erythema  Eyes: Conjunctivae and EOM are normal. Pupils are equal, round, and reactive to light. No scleral icterus.  Neck: Normal range of motion. Neck supple.  Cardiovascular: Normal rate, regular rhythm, normal heart sounds and intact distal pulses.   No murmur heard. Pulmonary/Chest: Effort normal and breath sounds normal. No respiratory distress. She has no wheezes. She has no rales.  Lymphadenopathy:    She has no cervical adenopathy.  Skin: Skin is warm and dry. No rash noted.  Nursing note and vitals reviewed.     Assessment & Plan:   Problem List Items Addressed This Visit    Sore throat - Primary    Likely allergic sore throat after exposure to known allergens when working in yard (high ragweed index). Less likely mild flu shot reaction, doubt viral infection. rec zyrtec trial (no improvement with alegra or claritin), start regular nasal  saline and shower after allergen exposure. Update if no better, consider singulair.  Pt agrees with plan.          Follow up plan: Return if symptoms worsen or fail to improve.

## 2015-08-09 ENCOUNTER — Other Ambulatory Visit: Payer: Self-pay | Admitting: Family Medicine

## 2015-08-09 DIAGNOSIS — Z1231 Encounter for screening mammogram for malignant neoplasm of breast: Secondary | ICD-10-CM

## 2015-08-16 ENCOUNTER — Ambulatory Visit
Admission: RE | Admit: 2015-08-16 | Discharge: 2015-08-16 | Disposition: A | Discharge status: Home / Self Care | Payer: Medicare Other | Source: Ambulatory Visit | Attending: Family Medicine | Admitting: Family Medicine

## 2015-08-16 DIAGNOSIS — Z1231 Encounter for screening mammogram for malignant neoplasm of breast: Secondary | ICD-10-CM | POA: Insufficient documentation

## 2015-08-16 LAB — HM MAMMOGRAPHY: HM Mammogram: NORMAL

## 2015-08-17 ENCOUNTER — Encounter: Payer: Self-pay | Admitting: *Deleted

## 2015-10-03 ENCOUNTER — Telehealth: Payer: Self-pay | Admitting: *Deleted

## 2015-10-03 NOTE — Telephone Encounter (Signed)
Patient received notice from insurance that fenofibrate 145mg  was going to increase to $35 in 2017. She can get 160mg  or gemfibrozil for $7.00 and was asking if she could change to one of those due to cost.

## 2015-10-05 MED ORDER — FENOFIBRATE 160 MG PO TABS: ORAL_TABLET | ORAL | Status: DC

## 2015-10-05 NOTE — Telephone Encounter (Signed)
Ok to change - sent in higher dose.

## 2015-10-05 NOTE — Telephone Encounter (Signed)
Message left advising patient.  

## 2015-10-08 MED ORDER — FENOFIBRATE 160 MG PO TABS: ORAL_TABLET | ORAL | Status: DC

## 2015-10-08 NOTE — Telephone Encounter (Addendum)
Pt request med sent to [facility] instead of local pharmacy. Advised pt done and rx cancelled at [facility].

## 2015-10-08 NOTE — Addendum Note (Signed)
Addended by: Helene Shoe on: 10/08/2015 02:46 PM   Modules accepted: Orders

## 2015-10-18 DIAGNOSIS — L821 Other seborrheic keratosis: Secondary | ICD-10-CM | POA: Diagnosis not present

## 2015-10-18 DIAGNOSIS — L820 Inflamed seborrheic keratosis: Secondary | ICD-10-CM | POA: Diagnosis not present

## 2015-10-18 DIAGNOSIS — Z1283 Encounter for screening for malignant neoplasm of skin: Secondary | ICD-10-CM | POA: Diagnosis not present

## 2015-10-18 DIAGNOSIS — D1800 Hemangioma unspecified site: Secondary | ICD-10-CM | POA: Diagnosis not present

## 2015-10-18 DIAGNOSIS — D229 Melanocytic nevi, unspecified: Secondary | ICD-10-CM | POA: Diagnosis not present

## 2015-10-18 DIAGNOSIS — D485 Neoplasm of uncertain behavior of skin: Secondary | ICD-10-CM | POA: Diagnosis not present

## 2015-10-18 DIAGNOSIS — R238 Other skin changes: Secondary | ICD-10-CM | POA: Diagnosis not present

## 2015-10-18 DIAGNOSIS — Z411 Encounter for cosmetic surgery: Secondary | ICD-10-CM | POA: Diagnosis not present

## 2015-10-18 DIAGNOSIS — L812 Freckles: Secondary | ICD-10-CM | POA: Diagnosis not present

## 2015-10-18 DIAGNOSIS — L918 Other hypertrophic disorders of the skin: Secondary | ICD-10-CM | POA: Diagnosis not present

## 2015-11-01 ENCOUNTER — Encounter: Payer: Self-pay | Admitting: *Deleted

## 2015-11-29 DIAGNOSIS — L820 Inflamed seborrheic keratosis: Secondary | ICD-10-CM | POA: Diagnosis not present

## 2015-11-29 DIAGNOSIS — Z411 Encounter for cosmetic surgery: Secondary | ICD-10-CM | POA: Diagnosis not present

## 2015-11-29 DIAGNOSIS — L309 Dermatitis, unspecified: Secondary | ICD-10-CM | POA: Diagnosis not present

## 2015-11-29 DIAGNOSIS — L918 Other hypertrophic disorders of the skin: Secondary | ICD-10-CM | POA: Diagnosis not present

## 2015-12-21 ENCOUNTER — Encounter: Payer: Self-pay | Admitting: Family Medicine

## 2015-12-21 ENCOUNTER — Ambulatory Visit (INDEPENDENT_AMBULATORY_CARE_PROVIDER_SITE_OTHER): Payer: Medicare Other | Admitting: Family Medicine

## 2015-12-21 VITALS — BP 130/78 | HR 88 | Temp 97.6°F | Ht 61.0 in | Wt 184.5 lb

## 2015-12-21 DIAGNOSIS — N949 Unspecified condition associated with female genital organs and menstrual cycle: Secondary | ICD-10-CM | POA: Diagnosis not present

## 2015-12-21 DIAGNOSIS — R110 Nausea: Secondary | ICD-10-CM

## 2015-12-21 DIAGNOSIS — N3289 Other specified disorders of bladder: Secondary | ICD-10-CM

## 2015-12-21 DIAGNOSIS — B373 Candidiasis of vulva and vagina: Secondary | ICD-10-CM

## 2015-12-21 DIAGNOSIS — B3731 Acute candidiasis of vulva and vagina: Secondary | ICD-10-CM

## 2015-12-21 LAB — POCT WET PREP (WET MOUNT)

## 2015-12-21 LAB — POC URINALSYSI DIPSTICK (AUTOMATED)
BILIRUBIN UA: NEGATIVE
Glucose, UA: NEGATIVE
KETONES UA: NEGATIVE
Nitrite, UA: NEGATIVE
PH UA: 6
PROTEIN UA: NEGATIVE
RBC UA: NEGATIVE
SPEC GRAV UA: 1.02
Urobilinogen, UA: 0.2

## 2015-12-21 LAB — POCT UA - MICROSCOPIC ONLY

## 2015-12-21 MED ORDER — FLUCONAZOLE 150 MG PO TABS: ORAL_TABLET | ORAL | Status: DC

## 2015-12-21 NOTE — Progress Notes (Signed)
Pre visit review using our clinic review tool, if applicable. No additional management support is needed unless otherwise documented below in the visit note. 

## 2015-12-21 NOTE — Addendum Note (Signed)
Addended by: Eliezer Lofts E on: 12/21/2015 10:59 AM   Modules accepted: Orders, Level of Service

## 2015-12-21 NOTE — Assessment & Plan Note (Signed)
UA clear .Marland Kitchen No UTI. Eval with wet prep. Showed few yreast.  Treat with diflucan but majority of symptoms likly from bladder spasm. Push fluids, avoid bladder irritants.

## 2015-12-21 NOTE — Progress Notes (Signed)
   Subjective:    Patient ID: Katherine Oliver, female    DOB: Feb 18, 1946, 70 y.o.   MRN: [id number]  HPI  70 year old female pt of Dr. Darnell Level with HTN, NAFLD presents with new onset 1 week of urinary urgency, decrease UOP, dysuria, low back pain. Started monistat x 3 days, improved some symptoms but burning with urine ? true dysuria and urgency continued, no fever, no chills. Yesterday bilateral lower quadrant pain, now resolved.  Conitnued vaginal irritation.  No vaginal discharge.  No blood in urine. INtermittent nausea in last 2 days, no emesis.  Nml,po intake.   She has a history of recurrent UTI and vaginal infections.  On no preventative.  Last UTI was  2 years ago. Nml wet prep and UA in 06/2015.  Review of Systems  Constitutional: Negative for fever and fatigue.  HENT: Negative for ear pain.   Eyes: Negative for pain.  Respiratory: Negative for chest tightness and shortness of breath.   Cardiovascular: Negative for chest pain, palpitations and leg swelling.       Objective:   Physical Exam  Constitutional: Vital signs are normal. She appears well-developed and well-nourished. She is cooperative.  Non-toxic appearance. She does not appear ill. No distress.  HENT:  Head: Normocephalic.  Right Ear: Hearing, tympanic membrane, external ear and ear canal normal. Tympanic membrane is not erythematous, not retracted and not bulging.  Left Ear: Hearing, tympanic membrane, external ear and ear canal normal. Tympanic membrane is not erythematous, not retracted and not bulging.  Nose: No mucosal edema or rhinorrhea. Right sinus exhibits no maxillary sinus tenderness and no frontal sinus tenderness. Left sinus exhibits no maxillary sinus tenderness and no frontal sinus tenderness.  Mouth/Throat: Uvula is midline, oropharynx is clear and moist and mucous membranes are normal.  Eyes: Conjunctivae, EOM and lids are normal. Pupils are equal, round, and reactive to light. Lids are  everted and swept, no foreign bodies found.  Neck: Trachea normal and normal range of motion. Neck supple. Carotid bruit is not present. No thyroid mass and no thyromegaly present.  Cardiovascular: Normal rate, regular rhythm, S1 normal, S2 normal, normal heart sounds, intact distal pulses and normal pulses.  Exam reveals no gallop and no friction rub.   No murmur heard. Pulmonary/Chest: Effort normal and breath sounds normal. No tachypnea. No respiratory distress. She has no decreased breath sounds. She has no wheezes. She has no rhonchi. She has no rales.  Abdominal: Soft. Normal appearance and bowel sounds are normal. There is no tenderness.  Neurological: She is alert.  Skin: Skin is warm, dry and intact. No rash noted.  Psychiatric: Her speech is normal and behavior is normal. Judgment and thought content normal. Her mood appears not anxious. Cognition and memory are normal. She does not exhibit a depressed mood.          Assessment & Plan:

## 2015-12-21 NOTE — Patient Instructions (Signed)
Take diflucan x 1 then repeat in 2 days if continued symptoms.  Push fluids and avoid bladder irritants such as caffeine.  Follow up with PCP if not improving as expected.

## 2015-12-29 ENCOUNTER — Other Ambulatory Visit: Payer: Self-pay | Admitting: Family Medicine

## 2015-12-29 DIAGNOSIS — D751 Secondary polycythemia: Secondary | ICD-10-CM

## 2015-12-29 DIAGNOSIS — E119 Type 2 diabetes mellitus without complications: Secondary | ICD-10-CM

## 2015-12-29 DIAGNOSIS — K760 Fatty (change of) liver, not elsewhere classified: Secondary | ICD-10-CM

## 2015-12-31 ENCOUNTER — Other Ambulatory Visit (INDEPENDENT_AMBULATORY_CARE_PROVIDER_SITE_OTHER): Payer: Medicare Other

## 2015-12-31 DIAGNOSIS — E119 Type 2 diabetes mellitus without complications: Secondary | ICD-10-CM | POA: Diagnosis not present

## 2015-12-31 DIAGNOSIS — D751 Secondary polycythemia: Secondary | ICD-10-CM

## 2015-12-31 DIAGNOSIS — K760 Fatty (change of) liver, not elsewhere classified: Secondary | ICD-10-CM | POA: Diagnosis not present

## 2015-12-31 LAB — MICROALBUMIN / CREATININE URINE RATIO
CREATININE, U: 132.9 mg/dL
MICROALB UR: 2.1 mg/dL — AB (ref 0.0–1.9)
Microalb Creat Ratio: 1.6 mg/g (ref 0.0–30.0)

## 2015-12-31 LAB — COMPREHENSIVE METABOLIC PANEL
ALK PHOS: 46 U/L (ref 39–117)
ALT: 103 U/L — AB (ref 0–35)
AST: 61 U/L — ABNORMAL HIGH (ref 0–37)
Albumin: 4.5 g/dL (ref 3.5–5.2)
BILIRUBIN TOTAL: 0.5 mg/dL (ref 0.2–1.2)
BUN: 30 mg/dL — ABNORMAL HIGH (ref 6–23)
CALCIUM: 9.9 mg/dL (ref 8.4–10.5)
CO2: 28 meq/L (ref 19–32)
CREATININE: 1.17 mg/dL (ref 0.40–1.20)
Chloride: 103 mEq/L (ref 96–112)
GFR: 48.41 mL/min — ABNORMAL LOW (ref >60.00)
GLUCOSE: 152 mg/dL — AB (ref 70–99)
Potassium: 4.4 mEq/L (ref 3.5–5.1)
Sodium: 139 mEq/L (ref 135–145)
TOTAL PROTEIN: 7.3 g/dL (ref 6.0–8.3)

## 2015-12-31 LAB — CBC WITH DIFFERENTIAL/PLATELET
BASOS ABS: 0 10*3/uL (ref 0.0–0.1)
BASOS PCT: 0.7 % (ref 0.0–3.0)
EOS ABS: 0 10*3/uL (ref 0.0–0.7)
Eosinophils Relative: 0 % (ref 0.0–5.0)
HCT: 44.1 % (ref 36.0–46.0)
Hemoglobin: 14.8 g/dL (ref 12.0–15.0)
LYMPHS ABS: 1.7 10*3/uL (ref 0.7–4.0)
Lymphocytes Relative: 30 % (ref 12.0–46.0)
MCHC: 33.5 g/dL (ref 30.0–36.0)
MCV: 82.6 fl (ref 78.0–100.0)
MONO ABS: 0.5 10*3/uL (ref 0.1–1.0)
Monocytes Relative: 8.4 % (ref 3.0–12.0)
NEUTROS ABS: 3.4 10*3/uL (ref 1.4–7.7)
NEUTROS PCT: 60.9 % (ref 43.0–77.0)
PLATELETS: 217 10*3/uL (ref 150.0–400.0)
RBC: 5.34 Mil/uL — ABNORMAL HIGH (ref 3.87–5.11)
RDW: 14.1 % (ref 11.5–15.5)
WBC: 5.7 10*3/uL (ref 4.0–10.5)

## 2015-12-31 LAB — HEMOGLOBIN A1C: Hgb A1c MFr Bld: 6.7 % — ABNORMAL HIGH (ref 4.6–6.5)

## 2016-01-02 DIAGNOSIS — N200 Calculus of kidney: Secondary | ICD-10-CM

## 2016-01-02 DIAGNOSIS — N281 Cyst of kidney, acquired: Secondary | ICD-10-CM

## 2016-01-02 HISTORY — DX: Cyst of kidney, acquired: N28.1

## 2016-01-02 HISTORY — DX: Calculus of kidney: N20.0

## 2016-01-03 ENCOUNTER — Ambulatory Visit (INDEPENDENT_AMBULATORY_CARE_PROVIDER_SITE_OTHER): Payer: Medicare Other | Admitting: Family Medicine

## 2016-01-03 ENCOUNTER — Encounter: Payer: Self-pay | Admitting: Family Medicine

## 2016-01-03 VITALS — BP 130/84 | HR 88 | Temp 98.0°F | Wt 182.5 lb

## 2016-01-03 DIAGNOSIS — E119 Type 2 diabetes mellitus without complications: Secondary | ICD-10-CM

## 2016-01-03 DIAGNOSIS — E785 Hyperlipidemia, unspecified: Secondary | ICD-10-CM | POA: Diagnosis not present

## 2016-01-03 DIAGNOSIS — I10 Essential (primary) hypertension: Secondary | ICD-10-CM | POA: Diagnosis not present

## 2016-01-03 DIAGNOSIS — E669 Obesity, unspecified: Secondary | ICD-10-CM

## 2016-01-03 DIAGNOSIS — D751 Secondary polycythemia: Secondary | ICD-10-CM

## 2016-01-03 DIAGNOSIS — E1121 Type 2 diabetes mellitus with diabetic nephropathy: Secondary | ICD-10-CM

## 2016-01-03 DIAGNOSIS — R918 Other nonspecific abnormal finding of lung field: Secondary | ICD-10-CM | POA: Insufficient documentation

## 2016-01-03 DIAGNOSIS — K760 Fatty (change of) liver, not elsewhere classified: Secondary | ICD-10-CM

## 2016-01-03 NOTE — Assessment & Plan Note (Signed)
Chronic, stable. Continue current regimen of fibrate, crestor and fish oil.

## 2016-01-03 NOTE — Assessment & Plan Note (Signed)
Continue to encourage healthy diet and lifestyle changes to affect sustainable weight loss.  

## 2016-01-03 NOTE — Progress Notes (Signed)
BP 130/84 mmHg  Pulse 88  Temp(Src) 98 F (36.7 C) (Oral)  Wt 182 lb 8 oz (82.781 kg)   CC: 21mo f/u visit  Subjective:    Patient ID: Katherine Oliver, female    DOB: [birth year], 70 y.o.   MRN: [id number]  HPI: Katherine Oliver is a 70 y.o. female presenting on 01/03/2016 for Follow-up   Seen by Dr Diona Browner last month with vaginal discomfort - treated for yeast infection. Likely concommitant bladder spasm. This has resolved.   HTN - doing better with daily HCTZ. Also on amlodipine and benazepril daily. No HA, vision changes, CP/tightness, SOB, leg swelling.   HLD - stable on crestor and fenofibrate and fish oil.   Diabetes - now diabetes range. Tries to avoid sweets and added sugars in diet. Yearly eye exam. Diabetic Foot Exam - Simple   Simple Foot Form  Diabetic Foot exam was performed with the following findings:  Yes 01/03/2016 11:38 AM  Visual Inspection  No deformities, no ulcerations, no other skin breakdown bilaterally:  Yes  Sensation Testing  Intact to touch and monofilament testing bilaterally:  Yes  Pulse Check  Posterior Tibialis and Dorsalis pulse intact bilaterally:  Yes  Comments       Fatty liver - with persistently elevated transaminitis.  Renal insuff - GFR 48.   Relevant past medical, surgical, family and social history reviewed and updated as indicated. Interim medical history since our last visit reviewed. Allergies and medications reviewed and updated. Current Outpatient Prescriptions on File Prior to Visit  Medication Sig  . amLODipine (NORVASC) 10 MG tablet Take 1 tablet (10 mg total) by mouth daily.  Marland Kitchen aspirin 81 MG tablet Take 81 mg by mouth daily.  . benazepril (LOTENSIN) 20 MG tablet Take 1 tablet by mouth  daily  . Calcium Carbonate-Vitamin D (CALCIUM 500/VITAMIN D PO) Take 2 tablets by mouth daily.  . diphenhydrAMINE (BENADRYL) 25 MG tablet Take 25 mg by mouth at bedtime as needed.  . fenofibrate 160 MG tablet Take 1 tablet  by mouth  daily  . fish oil-omega-3 fatty acids 1000 MG capsule Take 3 g by mouth daily.   . fluticasone (FLONASE) 50 MCG/ACT nasal spray Place 2 sprays into both nostrils daily. As needed  . hydrochlorothiazide (MICROZIDE) 12.5 MG capsule Take 1 capsule (12.5 mg total) by mouth daily.  . Multiple Vitamin (MULTIVITAMIN) tablet Take 1 tablet by mouth daily.  . naproxen sodium (ANAPROX) 220 MG tablet Take 220 mg by mouth as needed.  . rosuvastatin (CRESTOR) 5 MG tablet Take 1 tablet by mouth at   bedtime  . triamcinolone ointment (KENALOG) 0.1 % APPLY ON THE SKIN AT BEDTIME TO AFFECTED AREAS OF HANDS. AVOID FACE, GROIN, AND UNDERARMS   No current facility-administered medications on file prior to visit.    Review of Systems Per HPI unless specifically indicated in ROS section     Objective:    BP 130/84 mmHg  Pulse 88  Temp(Src) 98 F (36.7 C) (Oral)  Wt 182 lb 8 oz (82.781 kg)  Wt Readings from Last 3 Encounters:  01/03/16 182 lb 8 oz (82.781 kg)  12/21/15 184 lb 8 oz (83.689 kg)  07/12/15 178 lb 12 oz (81.08 kg)   Body mass index is 34.5 kg/(m^2).  Physical Exam  Constitutional: She appears well-developed and well-nourished. No distress.  HENT:  Head: Normocephalic and atraumatic.  Right Ear: External ear normal.  Left Ear: External ear normal.  Nose: Nose normal.  Mouth/Throat: Oropharynx is clear and moist. No oropharyngeal exudate.  Eyes: Conjunctivae and EOM are normal. Pupils are equal, round, and reactive to light. No scleral icterus.  Neck: Normal range of motion. Neck supple.  Cardiovascular: Normal rate, regular rhythm, normal heart sounds and intact distal pulses.   No murmur heard. Pulmonary/Chest: Effort normal and breath sounds normal. No respiratory distress. She has no wheezes. She has no rales.  Musculoskeletal: She exhibits no edema.  See HPI for foot exam if done  Lymphadenopathy:    She has no cervical adenopathy.  Skin: Skin is warm and dry. No rash  noted.  Psychiatric: She has a normal mood and affect.  Nursing note and vitals reviewed.  Results for orders placed or performed in visit on 12/31/15  Comprehensive metabolic panel  Result Value Ref Range   Sodium 139 135 - 145 mEq/L   Potassium 4.4 3.5 - 5.1 mEq/L   Chloride 103 96 - 112 mEq/L   CO2 28 19 - 32 mEq/L   Glucose, Bld 152 (H) 70 - 99 mg/dL   BUN 30 (H) 6 - 23 mg/dL   Creatinine, Ser 1.17 0.40 - 1.20 mg/dL   Total Bilirubin 0.5 0.2 - 1.2 mg/dL   Alkaline Phosphatase 46 39 - 117 U/L   AST 61 (H) 0 - 37 U/L   ALT 103 (H) 0 - 35 U/L   Total Protein 7.3 6.0 - 8.3 g/dL   Albumin 4.5 3.5 - 5.2 g/dL   Calcium 9.9 8.4 - 10.5 mg/dL   GFR 48.41 (L) >60.00 mL/min  Hemoglobin A1c  Result Value Ref Range   Hgb A1c MFr Bld 6.7 (H) 4.6 - 6.5 %  CBC with Differential/Platelet  Result Value Ref Range   WBC 5.7 4.0 - 10.5 K/uL   RBC 5.34 (H) 3.87 - 5.11 Mil/uL   Hemoglobin 14.8 12.0 - 15.0 g/dL   HCT 44.1 36.0 - 46.0 %   MCV 82.6 78.0 - 100.0 fl   MCHC 33.5 30.0 - 36.0 g/dL   RDW 14.1 11.5 - 15.5 %   Platelets 217.0 150.0 - 400.0 K/uL   Neutrophils Relative % 60.9 43.0 - 77.0 %   Lymphocytes Relative 30.0 12.0 - 46.0 %   Monocytes Relative 8.4 3.0 - 12.0 %   Eosinophils Relative 0.0 0.0 - 5.0 %   Basophils Relative 0.7 0.0 - 3.0 %   Neutro Abs 3.4 1.4 - 7.7 K/uL   Lymphs Abs 1.7 0.7 - 4.0 K/uL   Monocytes Absolute 0.5 0.1 - 1.0 K/uL   Eosinophils Absolute 0.0 0.0 - 0.7 K/uL   Basophils Absolute 0.0 0.0 - 0.1 K/uL  Microalbumin / creatinine urine ratio  Result Value Ref Range   Microalb, Ur 2.1 (H) 0.0 - 1.9 mg/dL   Creatinine,U 132.9 mg/dL   Microalb Creat Ratio 1.6 0.0 - 30.0 mg/g   Lab Results  Component Value Date   CHOL 150 07/02/2015   HDL 46.60 07/02/2015   LDLCALC 70 07/02/2015   TRIG 166.0* 07/02/2015   CHOLHDL 3 07/02/2015       Assessment & Plan:   Problem List Items Addressed This Visit    Pulmonary nodules    Update chest CT no contrast for h/o  pulm nodules in low risk patient.       Relevant Orders   CT Chest Wo Contrast   Polycythemia    Hgb now normal range again.       Obesity, Class I, BMI 30-34.9  Continue to encourage healthy diet and lifestyle changes to affect sustainable weight loss      Relevant Orders   US Abdomen Complete   NAFLD (nonalcoholic fatty liver disease)    Update abd [place]. ?NASH      Relevant Orders   US Abdomen Complete   HTN (hypertension)    Chronic, stable. Continue current regimen.      HLD (hyperlipidemia)    Chronic, stable. Continue current regimen of fibrate, crestor and fish oil.      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 nephropathy ([place]) - Primary    Reviewed slow trend in A1c again. Will refer for diabetes education at [facility] per patient preference. Discussed diabetic diet. Discussed foot care, eye care, tight control of other chronic medical conditions. Educational handout on diabetic diet provided today. RTC 3 mo f/u          Follow up plan: Return in about 3 months (around 04/04/2016), or if symptoms worsen or fail to improve, for follow up visit.

## 2016-01-03 NOTE — Patient Instructions (Addendum)
A1c up to diabetes range - although remains controlled, We will refer you to diabetes education - see Rosaria Ferries for this [employer]). We will order abdominal ultrasound to monitor liver We will check CT chest to monitor lung nodules found on prior CT scan.  Return in 3 months for diabetes follow up.  Diabetes Mellitus and Food It is important for you to manage your blood sugar (glucose) level. Your blood glucose level can be greatly affected by what you eat. Eating healthier foods in the appropriate amounts throughout the day at about the same time each day will help you control your blood glucose level. It can also help slow or prevent worsening of your diabetes mellitus. Healthy eating may even help you improve the level of your blood pressure and reach or maintain a healthy weight.  General recommendations for healthful eating and cooking habits include:  Eating meals and snacks regularly. Avoid going long periods of time without eating to lose weight.  Eating a diet that consists mainly of plant-based foods, such as fruits, vegetables, nuts, legumes, and whole grains.  Using low-heat cooking methods, such as baking, instead of high-heat cooking methods, such as deep frying. Work with your dietitian to make sure you understand how to use the Nutrition Facts information on food labels. HOW CAN FOOD AFFECT ME? Carbohydrates Carbohydrates affect your blood glucose level more than any other type of food. Your dietitian will help you determine how many carbohydrates to eat at each meal and teach you how to count carbohydrates. Counting carbohydrates is important to keep your blood glucose at a healthy level, especially if you are using insulin or taking certain medicines for diabetes mellitus. Alcohol Alcohol can cause sudden decreases in blood glucose (hypoglycemia), especially if you use insulin or take certain medicines for diabetes mellitus. Hypoglycemia can be a life-threatening condition. Symptoms  of hypoglycemia (sleepiness, dizziness, and disorientation) are similar to symptoms of having too much alcohol.  If your health care provider has given you approval to drink alcohol, do so in moderation and use the following guidelines:  Women should not have more than one drink per day, and men should not have more than two drinks per day. One drink is equal to:  12 oz of beer.  5 oz of wine.  1 oz of hard liquor.  Do not drink on an empty stomach.  Keep yourself hydrated. Have water, diet soda, or unsweetened iced tea.  Regular soda, juice, and other mixers might contain a lot of carbohydrates and should be counted. WHAT FOODS ARE NOT RECOMMENDED? As you make food choices, it is important to remember that all foods are not the same. Some foods have fewer nutrients per serving than other foods, even though they might have the same number of calories or carbohydrates. It is difficult to get your body what it needs when you eat foods with fewer nutrients. Examples of foods that you should avoid that are high in calories and carbohydrates but low in nutrients include:  Trans fats (most processed foods list trans fats on the Nutrition Facts label).  Regular soda.  Juice.  Candy.  Sweets, such as cake, pie, doughnuts, and cookies.  Fried foods. WHAT FOODS CAN I EAT? Eat nutrient-rich foods, which will nourish your body and keep you healthy. The food you should eat also will depend on several factors, including:  The calories you need.  The medicines you take.  Your weight.  Your blood glucose level.  Your blood pressure level.  Your cholesterol level. You should eat a variety of foods, including:  Protein.  Lean cuts of meat.  Proteins low in saturated fats, such as fish, egg whites, and beans. Avoid processed meats.  Fruits and vegetables.  Fruits and vegetables that may help control blood glucose levels, such as apples, mangoes, and yams.  Dairy  products.  Choose fat-free or low-fat dairy products, such as milk, yogurt, and cheese.  Grains, bread, pasta, and rice.  Choose whole grain products, such as multigrain bread, whole oats, and brown rice. These foods may help control blood pressure.  Fats.  Foods containing healthful fats, such as nuts, avocado, olive oil, canola oil, and fish. DOES EVERYONE WITH DIABETES MELLITUS HAVE THE SAME MEAL PLAN? Because every person with diabetes mellitus is different, there is not one meal plan that works for everyone. It is very important that you meet with a dietitian who will help you create a meal plan that is just right for you.   This information is not intended to replace advice given to you by your health care provider. Make sure you discuss any questions you have with your health care provider.   Document Released: 07/17/2005 Document Revised: 11/10/2014 Document Reviewed: 09/16/2013 Elsevier Interactive Patient Education [employer].

## 2016-01-03 NOTE — Progress Notes (Signed)
Pre visit review using our clinic review tool, if applicable. No additional management support is needed unless otherwise documented below in the visit note. 

## 2016-01-03 NOTE — Assessment & Plan Note (Signed)
Chronic, stable. Continue current regimen. 

## 2016-01-03 NOTE — Assessment & Plan Note (Signed)
Update chest CT no contrast for h/o pulm nodules in low risk patient.

## 2016-01-03 NOTE — Assessment & Plan Note (Signed)
Update abd [place]. ?NASH

## 2016-01-03 NOTE — Assessment & Plan Note (Addendum)
Reviewed slow trend in A1c again. Will refer for diabetes education at [facility] per patient preference. Discussed diabetic diet. Discussed foot care, eye care, tight control of other chronic medical conditions. Educational handout on diabetic diet provided today. RTC 3 mo f/u

## 2016-01-03 NOTE — Assessment & Plan Note (Signed)
Hgb now normal range again.

## 2016-01-09 DIAGNOSIS — L820 Inflamed seborrheic keratosis: Secondary | ICD-10-CM | POA: Diagnosis not present

## 2016-01-09 DIAGNOSIS — B078 Other viral warts: Secondary | ICD-10-CM | POA: Diagnosis not present

## 2016-01-09 DIAGNOSIS — L821 Other seborrheic keratosis: Secondary | ICD-10-CM | POA: Diagnosis not present

## 2016-01-09 DIAGNOSIS — L309 Dermatitis, unspecified: Secondary | ICD-10-CM | POA: Diagnosis not present

## 2016-01-11 ENCOUNTER — Ambulatory Visit
Admission: RE | Admit: 2016-01-11 | Discharge: 2016-01-11 | Disposition: A | Discharge status: Home / Self Care | Payer: Medicare Other | Source: Ambulatory Visit | Attending: Family Medicine | Admitting: Family Medicine

## 2016-01-11 DIAGNOSIS — R918 Other nonspecific abnormal finding of lung field: Secondary | ICD-10-CM | POA: Diagnosis not present

## 2016-01-11 DIAGNOSIS — E669 Obesity, unspecified: Secondary | ICD-10-CM | POA: Diagnosis not present

## 2016-01-11 DIAGNOSIS — Z9049 Acquired absence of other specified parts of digestive tract: Secondary | ICD-10-CM | POA: Diagnosis not present

## 2016-01-11 DIAGNOSIS — K760 Fatty (change of) liver, not elsewhere classified: Secondary | ICD-10-CM

## 2016-01-12 ENCOUNTER — Encounter: Payer: Self-pay | Admitting: Family Medicine

## 2016-01-30 DIAGNOSIS — E119 Type 2 diabetes mellitus without complications: Secondary | ICD-10-CM | POA: Diagnosis not present

## 2016-02-18 ENCOUNTER — Encounter: Payer: Self-pay | Admitting: Family Medicine

## 2016-02-27 DIAGNOSIS — E119 Type 2 diabetes mellitus without complications: Secondary | ICD-10-CM | POA: Diagnosis not present

## 2016-03-31 ENCOUNTER — Other Ambulatory Visit: Payer: Self-pay | Admitting: Family Medicine

## 2016-03-31 DIAGNOSIS — E1121 Type 2 diabetes mellitus with diabetic nephropathy: Secondary | ICD-10-CM

## 2016-03-31 DIAGNOSIS — K760 Fatty (change of) liver, not elsewhere classified: Secondary | ICD-10-CM

## 2016-04-01 ENCOUNTER — Other Ambulatory Visit (INDEPENDENT_AMBULATORY_CARE_PROVIDER_SITE_OTHER): Payer: Medicare Other

## 2016-04-01 DIAGNOSIS — K760 Fatty (change of) liver, not elsewhere classified: Secondary | ICD-10-CM

## 2016-04-01 DIAGNOSIS — E1121 Type 2 diabetes mellitus with diabetic nephropathy: Secondary | ICD-10-CM

## 2016-04-01 LAB — COMPREHENSIVE METABOLIC PANEL
ALK PHOS: 40 U/L (ref 39–117)
ALT: 63 U/L — AB (ref 0–35)
AST: 34 U/L (ref 0–37)
Albumin: 4.6 g/dL (ref 3.5–5.2)
BILIRUBIN TOTAL: 0.5 mg/dL (ref 0.2–1.2)
BUN: 33 mg/dL — ABNORMAL HIGH (ref 6–23)
CALCIUM: 10.5 mg/dL (ref 8.4–10.5)
CO2: 29 mEq/L (ref 19–32)
CREATININE: 1.27 mg/dL — AB (ref 0.40–1.20)
Chloride: 105 mEq/L (ref 96–112)
GFR: 44.01 mL/min — AB (ref >60.00)
GLUCOSE: 121 mg/dL — AB (ref 70–99)
Potassium: 4.4 mEq/L (ref 3.5–5.1)
Sodium: 140 mEq/L (ref 135–145)
TOTAL PROTEIN: 7 g/dL (ref 6.0–8.3)

## 2016-04-01 LAB — HEMOGLOBIN A1C: Hgb A1c MFr Bld: 6.5 % (ref 4.6–6.5)

## 2016-04-04 ENCOUNTER — Telehealth: Payer: Self-pay | Admitting: Family Medicine

## 2016-04-04 ENCOUNTER — Encounter: Payer: Self-pay | Admitting: Family Medicine

## 2016-04-04 ENCOUNTER — Ambulatory Visit (INDEPENDENT_AMBULATORY_CARE_PROVIDER_SITE_OTHER): Payer: Medicare Other | Admitting: Family Medicine

## 2016-04-04 VITALS — BP 128/62 | HR 82 | Temp 98.3°F | Wt 176.5 lb

## 2016-04-04 DIAGNOSIS — E1122 Type 2 diabetes mellitus with diabetic chronic kidney disease: Secondary | ICD-10-CM | POA: Insufficient documentation

## 2016-04-04 DIAGNOSIS — E669 Obesity, unspecified: Secondary | ICD-10-CM

## 2016-04-04 DIAGNOSIS — K760 Fatty (change of) liver, not elsewhere classified: Secondary | ICD-10-CM

## 2016-04-04 DIAGNOSIS — E785 Hyperlipidemia, unspecified: Secondary | ICD-10-CM

## 2016-04-04 DIAGNOSIS — M65341 Trigger finger, right ring finger: Secondary | ICD-10-CM | POA: Insufficient documentation

## 2016-04-04 DIAGNOSIS — E1121 Type 2 diabetes mellitus with diabetic nephropathy: Secondary | ICD-10-CM

## 2016-04-04 DIAGNOSIS — N183 Chronic kidney disease, stage 3 unspecified: Secondary | ICD-10-CM

## 2016-04-04 DIAGNOSIS — I10 Essential (primary) hypertension: Secondary | ICD-10-CM | POA: Diagnosis not present

## 2016-04-04 NOTE — Patient Instructions (Addendum)
Schedule appointment with Dr Lorelei Pont to discuss trigger finger injection.   You are doing well today - keep up the good work Return as needed or in 3-4 months for wellness visit.

## 2016-04-04 NOTE — Progress Notes (Signed)
Pre visit review using our clinic review tool, if applicable. No additional management support is needed unless otherwise documented below in the visit note. 

## 2016-04-04 NOTE — Assessment & Plan Note (Addendum)
Chronic, stable. Continue crestor and fenofibrate and fish oil.

## 2016-04-04 NOTE — Assessment & Plan Note (Signed)
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 Requests further eval. Will refer to [facility] vs ortho, pt would like to start with injection of possible.

## 2016-04-04 NOTE — Telephone Encounter (Signed)
Just checking to make sure you will do trigger finger injections Thanks

## 2016-04-04 NOTE — Assessment & Plan Note (Signed)
Congratulated on noted weight loss.

## 2016-04-04 NOTE — Assessment & Plan Note (Signed)
Abd [place] stable. NAFLD - with statin and fibrate and fish oil on board - will need to consider titration off one med if persistent transaminitis.

## 2016-04-04 NOTE — Assessment & Plan Note (Signed)
Chronic, stable. Continue current regimen. 

## 2016-04-04 NOTE — Assessment & Plan Note (Signed)
Chronic, stable.  Completed DSME at [facility]. Very happy with educational nature of courses.  A1c improved. Continue diet controlled.

## 2016-04-04 NOTE — Assessment & Plan Note (Addendum)
Stable abd [place] 01/2016 Consider SPEP next labwork.

## 2016-04-04 NOTE — Progress Notes (Signed)
BP 128/62 mmHg  Pulse 82  Temp(Src) 98.3 F (36.8 C) (Oral)  Wt 176 lb 8 oz (80.06 kg)   CC: f/u visit  Subjective:    Patient ID: Katherine Oliver, female    DOB: 1946-08-26, 70 y.o.   MRN: [id number]  HPI: Katherine Oliver is a 70 y.o. female presenting on 04/04/2016 for Follow-up   6lb weight loss over last 3 months! Cutting out carbs. Needs to do better with exercise - some R hip/leg pain that is now getting better. ?early arthritis in R hip.   R 4th trigger finger worsening. Getting painful. H/o 3 trigger fingers s/p release (2007,2010,2011).   HTN - Compliant with current antihypertensive regimen of amlodipine, benazepril, hctz daily.  Does check blood pressures at home: well controlled. No low blood pressure readings or symptoms of dizziness/syncope. Denies HA, vision changes, CP/tightness, SOB, leg swelling.   DM - regularly does not check sugars. Compliant with antihyperglycemic regimen which includes: diet controlled. Denies low sugars or hypoglycemic symptoms.  Denies paresthesias. Last diabetic eye exam 06/2015.  Pneumovax: 2013.  Prevnar: 2015. Lab Results  Component Value Date   HGBA1C 6.5 04/01/2016   Diabetic Foot Exam - Simple   No data filed    done last visit.   Fatty liver with transaminitis - recent abd [place] stable.  CKD stage 3 - normal kidneys recent abd [place].   Relevant past medical, surgical, family and social history reviewed and updated as indicated. Interim medical history since our last visit reviewed. Allergies and medications reviewed and updated. Current Outpatient Prescriptions on File Prior to Visit  Medication Sig  . amLODipine (NORVASC) 10 MG tablet Take 1 tablet (10 mg total) by mouth daily.  Marland Kitchen aspirin 81 MG tablet Take 81 mg by mouth daily.  . benazepril (LOTENSIN) 20 MG tablet Take 1 tablet by mouth  daily  . Calcium Carbonate-Vitamin D (CALCIUM 500/VITAMIN D PO) Take 2 tablets by mouth daily.  . diphenhydrAMINE (BENADRYL)  25 MG tablet Take 25 mg by mouth at bedtime as needed.  . fenofibrate 160 MG tablet Take 1 tablet by mouth  daily  . fish oil-omega-3 fatty acids 1000 MG capsule Take 3 g by mouth daily.   . fluticasone (FLONASE) 50 MCG/ACT nasal spray Place 2 sprays into both nostrils daily. As needed  . hydrochlorothiazide (MICROZIDE) 12.5 MG capsule Take 1 capsule (12.5 mg total) by mouth daily.  . Multiple Vitamin (MULTIVITAMIN) tablet Take 1 tablet by mouth daily.  . naproxen sodium (ANAPROX) 220 MG tablet Take 220 mg by mouth as needed.  . rosuvastatin (CRESTOR) 5 MG tablet Take 1 tablet by mouth at   bedtime  . triamcinolone ointment (KENALOG) 0.1 % APPLY ON THE SKIN AT BEDTIME TO AFFECTED AREAS OF HANDS. AVOID FACE, GROIN, AND UNDERARMS   No current facility-administered medications on file prior to visit.    Review of Systems Per HPI unless specifically indicated in ROS section     Objective:    BP 128/62 mmHg  Pulse 82  Temp(Src) 98.3 F (36.8 C) (Oral)  Wt 176 lb 8 oz (80.06 kg)  Wt Readings from Last 3 Encounters:  04/04/16 176 lb 8 oz (80.06 kg)  01/03/16 182 lb 8 oz (82.781 kg)  12/21/15 184 lb 8 oz (83.689 kg)   Body mass index is 33.37 kg/(m^2).   Physical Exam  Constitutional: She appears well-developed and well-nourished. No distress.  HENT:  Head: Normocephalic and atraumatic.  Mouth/Throat: Oropharynx is clear  and moist. No oropharyngeal exudate.  Eyes: Conjunctivae and EOM are normal. Pupils are equal, round, and reactive to light. No scleral icterus.  Neck: Normal range of motion. Neck supple.  Cardiovascular: Normal rate, regular rhythm, normal heart sounds and intact distal pulses.   No murmur heard. Pulmonary/Chest: Effort normal and breath sounds normal. No respiratory distress. She has no wheezes. She has no rales.  Musculoskeletal: She exhibits no edema.  See HPI for foot exam if done  Lymphadenopathy:    She has no cervical adenopathy.  Skin: Skin is warm and  dry. No rash noted.  Psychiatric: She has a normal mood and affect.  Nursing note and vitals reviewed.  Results for orders placed or performed in visit on 04/04/16  HM DIABETES EYE EXAM  Result Value Ref Range   HM Diabetic Eye Exam No Retinopathy No Retinopathy   Lab Results  Component Value Date   CHOL 150 07/02/2015   HDL 46.60 07/02/2015   LDLCALC 70 07/02/2015   TRIG 166.0* 07/02/2015   CHOLHDL 3 07/02/2015       Assessment & Plan:   Problem List Items Addressed This Visit    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 nephropathy ([place]) - Primary    Chronic, stable.  Completed DSME at [facility]. Very happy with educational nature of courses.  A1c improved. Continue diet controlled.       HTN (hypertension)    Chronic, stable. Continue current regimen.       HLD (hyperlipidemia)    Chronic, stable. Continue crestor and fenofibrate and fish oil.       NAFLD (nonalcoholic fatty liver disease)    Abd [place] stable. NAFLD - with statin and fibrate and fish oil on board - will need to consider titration off one med if persistent transaminitis.      Obesity, Class I, BMI 30-34.9    Congratulated on noted weight loss.       CKD stage 3 due to type 2 diabetes mellitus ([place])    Stable abd [place] 01/2016 Consider SPEP next labwork.       Trigger ring finger of right hand    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 Requests further eval. Will refer to SM vs ortho, pt would like to start with injection of possible.          Follow up plan: Return in about 3 months (around 07/05/2016) for medicare wellness visit.  [name], MD

## 2016-04-05 NOTE — Telephone Encounter (Signed)
Yes, do them all the time.

## 2016-04-07 NOTE — Telephone Encounter (Signed)
Pt aware.

## 2016-04-14 ENCOUNTER — Ambulatory Visit (INDEPENDENT_AMBULATORY_CARE_PROVIDER_SITE_OTHER): Payer: Medicare Other | Admitting: Family Medicine

## 2016-04-14 ENCOUNTER — Encounter: Payer: Self-pay | Admitting: Family Medicine

## 2016-04-14 VITALS — BP 120/60 | HR 77 | Temp 98.8°F | Ht 61.0 in | Wt 178.0 lb

## 2016-04-14 DIAGNOSIS — M65341 Trigger finger, right ring finger: Secondary | ICD-10-CM

## 2016-04-14 MED ORDER — METHYLPREDNISOLONE ACETATE 40 MG/ML IJ SUSP
20.0000 mg | Freq: Once | INTRAMUSCULAR | Status: AC
  Administered 2016-04-14: 20 mg via INTRA_ARTICULAR

## 2016-04-14 NOTE — Progress Notes (Signed)
Dr. Frederico Hamman T. Hipolito Martinezlopez, MD, [facility] [place], [address] Phone: [phone] Fax: [phone]  04/14/2016  Patient: Katherine Oliver, MRN: [id number], DOB: Mar 05, 1946, 70 y.o.  Primary Physician:  [name], MD   Chief Complaint  Patient presents with  . Trigger Finger    Right Ring Finger   Subjective:   Katherine Oliver is a 70 y.o. very pleasant female patient who presents with the following:  R hand 4th trigger finger. She has had this for 6 years. Never tried any other therapy. Has had several other trigger finger releases and injection.  Trigger finger injection  Past Medical History, Surgical History, Social History, Family History, Problem List, Medications, and Allergies have been reviewed and updated if relevant.  Patient Active Problem List   Diagnosis Date Noted  . CKD stage 3 due to type 2 diabetes mellitus ([place]) 04/04/2016  . Trigger ring finger of right hand 04/04/2016  . Pulmonary nodules 01/03/2016  . Advanced care planning/counseling discussion 07/05/2015  . Obesity, Class I, BMI 30-34.9 07/05/2015  . 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 Osteopenia 06/03/2013  . Medicare annual wellness visit, subsequent 05/31/2012  . Rotator cuff tendonitis, right 03/01/2012  . 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 Polycythemia 12/02/2011  . 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 nephropathy ([place])   . HTN (hypertension)   . HLD (hyperlipidemia)   . History of recurrent UTIs   . Allergic rhinitis   . GERD (gastroesophageal reflux disease)   . Rosacea   . NAFLD (nonalcoholic fatty liver disease) 06/03/2010  . Colon polyp 09/03/2008    Past Medical History  Diagnosis Date  . HTN (hypertension)   . HLD (hyperlipidemia)   . Hepatic steatosis     by abd [place] 05/2012, mild transaminitis - normal iron sat and viral hep panel (2011), stable [place] 2017  .  History of recurrent UTIs     on chronic keflex  . 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 Arthritis   . Allergic rhinitis   . History of chicken pox   . History of measles   . Rosacea     metrogel  . GERD (gastroesophageal reflux disease)   . Polycythemia     mild, stable (2013)  . Osteopenia 06/2013    mild, forearm T -1.1, hip and spine WNL  . Lung nodules 11/2013    overall stable on f/u CT 01/2016  . Hypertensive retinopathy of both eyes, 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    Bulakowski  . 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 Kidney cyst, acquired 01/2016    L kidney by [place]  . Kidney stone 01/2016    L kidney by [place]  . 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 nephropathy [facility])     DSME [facility] 01/2016     Past Surgical History  Procedure Laterality Date  . Appendectomy  1987  . Vaginal hysterectomy  1984    for menorrhagia, ovaries in place  . Cholecystectomy  2003  . Dexa  2003    normal  . Cardiolite stress test  04/2004    normal  . Cesarean section  [id number]    x2  . Colonoscopy  09/26/2008    adenomatous polyp, rpt 3-5 yrs  . Trigger finger release  2007;2010;2011    bilateral  . Colonoscopy  08/2012  adenomatous polyps, diverticulosis, rec rpt 5 yrs [name])  . Dexa  06/2013    [facility] - Tscore -1.1 forearm, normal spine and femur  . Breast biopsy Right 1963    benign  . Breast biopsy Right 08/2014    benign    Social History   Social History  . Marital Status: Married    Spouse Name: N/A  . Number of Children: N/A  . Years of Education: N/A   Occupational History  . Not on file.   Social History Main Topics  . Smoking status: Never Smoker   . Smokeless tobacco: Never Used  . Alcohol Use: Yes     Comment: Occasional  . Drug Use: No  . Sexual Activity: Not on file   Other Topics Concern  . 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 Daily walking about 20 min.    Diet: ok water intake 4 glasses/day, daily fruits/vegetables, red meat 4x/wk, fish 3-4x/wk    Family History  Problem Relation Age of Onset  . Stroke Mother     several  . Hyperlipidemia Mother   . Hypertension Mother   . Cancer Father     colon  . Hypertension Father   . Hyperlipidemia Father   . Cancer Paternal Aunt     abdominal  . Coronary artery disease Maternal Grandmother   . Diabetes Maternal Grandfather   . Coronary artery disease Maternal Grandfather   . Coronary artery disease Father 83    MIx1, CABG    Allergies  Allergen Reactions  . Sulfa Antibiotics   . Adhesive [Tape] Rash    Paper tape - blisters    Medication list reviewed and updated in full in [place].  GEN: No fevers, chills. Nontoxic. Primarily MSK c/o today. MSK: Detailed in the HPI GI: tolerating PO intake without difficulty Neuro: No numbness, parasthesias, or tingling associated. Otherwise the pertinent positives of the ROS are noted above.   Objective:   BP 120/60 mmHg  Pulse 77  Temp(Src) 98.8 F (37.1 C) (Oral)  Ht 5\' 1"  (1.549 m)  Wt 178 lb (80.74 kg)  BMI 33.65 kg/m2   GEN: WDWN, NAD, Non-toxic, Alert & Oriented x 3 HEENT: Atraumatic, Normocephalic.  Ears and Nose: No external deformity. EXTR: No clubbing/cyanosis/edema NEURO: Normal gait.  PSYCH: Normally interactive. Conversant. Not depressed or anxious appearing.  Calm demeanor.   R hand Ecchymosis or edema: neg ROM wrist/hand/digits: full  Carpals, MCP's, digits: NT Distal Ulna and Radius: NT Ecchymosis or edema: neg No instability Cysts/nodules: 4th Digit triggering: 4th Finkelstein's test: neg Snuffbox tenderness: neg Scaphoid tubercle: NT Resisted supination: NT Full composite fist, no malrotation Grip, all digits: 5/5  str DIPJT: NT PIP JT: NT MCP JT: NT No tenosynovitis Axial load test: neg Phalen's: neg Tinel's: neg Atrophy: neg  Hand sensation: intact   Radiology: No results found.  Assessment and Plan:   Trigger ring finger of right hand - Plan: methylPREDNISolone acetate (DEPO-MEDROL) injection 20 mg  Using an anatomical model, I reviewed with the patent the structures involved and how they related to their diagnosis .  We discussed the pathophysiology of trigger fingers. Discussed the inflammatory nature of nodule creation and likely nodule abutting the A1 pulley system, this causing the  patient's discomfort and sensations. We discussed that treatments for this include direct injection into the tendon sheath to attempt to shrink catching tissue. This can be done 1-2 times. Other treatments include surgical release. If the patient fails to trigger finger injections, I would recommend trigger finger release if the patient desires relief of the symptoms.   Trigger Finger Injection, R 4th Verbal consent was obtained. Risks (including rare risk of infection, potential risk for skin lightening and potential atrophy), benefits and alternatives were discussed. Prepped with Chloraprep and Ethyl Chloride used for anesthesia. Under sterile conditions, patient injected at palmar crease aiming distally with 45 degree angle towards nodule; injected directly into tendon sheath. Medication flowed freely without resistance.  Needle size: 22 gauge 1 1/2 inch Injection: 1/2 cc of Lidocaine 1% and Depo-Medrol 20 mg   Follow-up: No Follow-up on file.  Signed,  Maud Deed. Taos Tapp, MD   Patient's Medications  New Prescriptions   No medications on file  Previous Medications   AMLODIPINE (NORVASC) 10 MG TABLET    Take 1 tablet (10 mg total) by mouth daily.   ASPIRIN 81 MG TABLET    Take 81 mg by mouth daily.   BENAZEPRIL (LOTENSIN) 20 MG TABLET    Take 1 tablet by mouth  daily   CALCIUM CARBONATE-VITAMIN D  (CALCIUM 500/VITAMIN D PO)    Take 2 tablets by mouth daily.   DIPHENHYDRAMINE (BENADRYL) 25 MG TABLET    Take 25 mg by mouth at bedtime as needed.   FENOFIBRATE 160 MG TABLET    Take 1 tablet by mouth  daily   FISH OIL-OMEGA-3 FATTY ACIDS 1000 MG CAPSULE    Take 3 g by mouth daily.    FLUTICASONE (FLONASE) 50 MCG/ACT NASAL SPRAY    Place 2 sprays into both nostrils daily. As needed   HYDROCHLOROTHIAZIDE (MICROZIDE) 12.5 MG CAPSULE    Take 1 capsule (12.5 mg total) by mouth daily.   MULTIPLE VITAMIN (MULTIVITAMIN) TABLET    Take 1 tablet by mouth daily.   NAPROXEN SODIUM (ANAPROX) 220 MG TABLET    Take 220 mg by mouth as needed.   ROSUVASTATIN (CRESTOR) 5 MG TABLET    Take 1 tablet by mouth at   bedtime   TRIAMCINOLONE OINTMENT (KENALOG) 0.1 %    APPLY ON THE SKIN AT BEDTIME TO AFFECTED AREAS OF HANDS. AVOID FACE, GROIN, AND UNDERARMS  Modified Medications   No medications on file  Discontinued Medications   No medications on file

## 2016-04-14 NOTE — Progress Notes (Signed)
Pre visit review using our clinic review tool, if applicable. No additional management support is needed unless otherwise documented below in the visit note. 

## 2016-05-12 DIAGNOSIS — M25512 Pain in left shoulder: Secondary | ICD-10-CM | POA: Diagnosis not present

## 2016-06-02 ENCOUNTER — Other Ambulatory Visit: Payer: Self-pay | Admitting: Family Medicine

## 2016-06-12 DIAGNOSIS — H04123 Dry eye syndrome of bilateral lacrimal glands: Secondary | ICD-10-CM | POA: Diagnosis not present

## 2016-06-12 DIAGNOSIS — I10 Essential (primary) hypertension: Secondary | ICD-10-CM | POA: Diagnosis not present

## 2016-06-12 DIAGNOSIS — H35033 Hypertensive retinopathy, bilateral: Secondary | ICD-10-CM | POA: Diagnosis not present

## 2016-06-12 DIAGNOSIS — H2513 Age-related nuclear cataract, bilateral: Secondary | ICD-10-CM | POA: Diagnosis not present

## 2016-06-12 DIAGNOSIS — H524 Presbyopia: Secondary | ICD-10-CM | POA: Diagnosis not present

## 2016-06-12 DIAGNOSIS — H02035 Senile entropion of left lower eyelid: Secondary | ICD-10-CM | POA: Diagnosis not present

## 2016-06-12 LAB — HM DIABETES EYE EXAM

## 2016-06-20 ENCOUNTER — Encounter: Payer: Self-pay | Admitting: Family Medicine

## 2016-07-10 DIAGNOSIS — H02035 Senile entropion of left lower eyelid: Secondary | ICD-10-CM | POA: Diagnosis not present

## 2016-07-14 ENCOUNTER — Other Ambulatory Visit: Payer: Self-pay | Admitting: *Deleted

## 2016-07-14 ENCOUNTER — Ambulatory Visit (INDEPENDENT_AMBULATORY_CARE_PROVIDER_SITE_OTHER): Payer: Medicare Other

## 2016-07-14 VITALS — BP 120/78 | HR 74 | Temp 98.5°F | Ht 61.0 in | Wt 182.5 lb

## 2016-07-14 DIAGNOSIS — Z23 Encounter for immunization: Secondary | ICD-10-CM

## 2016-07-14 DIAGNOSIS — Z0000 Encounter for general adult medical examination without abnormal findings: Secondary | ICD-10-CM | POA: Diagnosis not present

## 2016-07-14 MED ORDER — BENAZEPRIL HCL 20 MG PO TABS: ORAL_TABLET | ORAL | 3 refills | Status: DC

## 2016-07-14 MED ORDER — HYDROCHLOROTHIAZIDE 12.5 MG PO CAPS: 12.5000 mg | ORAL_CAPSULE | Freq: Every day | ORAL | 3 refills | Status: DC

## 2016-07-14 MED ORDER — FENOFIBRATE 160 MG PO TABS: ORAL_TABLET | ORAL | 3 refills | Status: DC

## 2016-07-14 MED ORDER — ROSUVASTATIN CALCIUM 5 MG PO TABS: ORAL_TABLET | ORAL | 3 refills | Status: DC

## 2016-07-14 NOTE — Progress Notes (Signed)
Subjective:   Katherine Oliver is a 70 y.o. female who presents for Medicare Annual (Subsequent) preventive examination.  Review of Systems:  N/A Cardiac Risk Factors include: advanced age (>19men, >65 women);obesity (BMI >30kg/m2);dyslipidemia;hypertension     Objective:     Vitals: BP 120/78 (BP Location: Left Arm, Patient Position: Sitting, Cuff Size: Normal)   Pulse 74   Temp 98.5 F (36.9 C) (Oral)   Ht 5\' 1"  (1.549 m) Comment: no shoes  Wt 182 lb 8 oz (82.8 kg)   SpO2 93%   BMI 34.48 kg/m   Body mass index is 34.48 kg/m.   Tobacco History  Smoking Status  . Never Smoker  Smokeless Tobacco  . Never Used     Counseling given: No   Past Medical History:  Diagnosis Date  . Allergic rhinitis   . Arthritis   . 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 nephropathy ([place])    DSME [facility] 01/2016   . GERD (gastroesophageal reflux disease)   . Hepatic steatosis    by abd [place] 05/2012, mild transaminitis - normal iron sat and viral hep panel (2011), stable [place] 2017  . History of chicken pox   . History of measles   . History of recurrent UTIs    on chronic keflex  . HLD (hyperlipidemia)   . HTN (hypertension)   . 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 Kidney cyst, acquired 01/2016   L kidney by [place]  . Kidney stone 01/2016   L kidney by [place]  . Lung nodules 11/2013   overall stable on f/u CT 01/2016  . Osteopenia 06/2013   mild, forearm T -1.1, hip and spine WNL  . Polycythemia    mild, stable (2013)  . Rosacea    metrogel   Past Surgical History:  Procedure Laterality Date  . APPENDECTOMY  1987  . BREAST BIOPSY Right 1963   benign  . BREAST BIOPSY Right 08/2014   benign  . cardiolite stress test  04/2004   normal  . CESAREAN SECTION  4431;[id number]   x2  . CHOLECYSTECTOMY  2003  . COLONOSCOPY   09/26/2008   adenomatous polyp, rpt 3-5 yrs  . COLONOSCOPY  08/2012   adenomatous polyps, diverticulosis, rec rpt 5 yrs [name])  . dexa  2003   normal  . dexa  06/2013   [facility] - Tscore -1.1 forearm, normal spine and femur  . TRIGGER FINGER RELEASE  2007;2010;2011   bilateral  . VAGINAL HYSTERECTOMY  1984   for menorrhagia, ovaries in place   Family History  Problem Relation Age of Onset  . Stroke Mother     several  . Hyperlipidemia Mother   . Hypertension Mother   . Cancer Father     colon  . Hypertension Father   . Hyperlipidemia Father   . Coronary artery disease Father 25    MIx1, CABG  . Cancer Paternal Aunt     abdominal  . Coronary artery disease Maternal Grandmother   . Diabetes Maternal Grandfather   . Coronary artery disease Maternal Grandfather    History  Sexual Activity  . Sexual activity: Yes    Outpatient Encounter Prescriptions as of 07/14/2016  Medication Sig  . amLODipine (NORVASC) 10 MG tablet Take 1 tablet by mouth  daily  . aspirin  81 MG tablet Take 81 mg by mouth daily.  . benazepril (LOTENSIN) 20 MG tablet Take 1 tablet by mouth  daily  . Calcium Carbonate-Vitamin D (CALCIUM 500/VITAMIN D PO) Take 2 tablets by mouth daily.  . diphenhydrAMINE (BENADRYL) 25 MG tablet Take 25 mg by mouth at bedtime as needed.  . fenofibrate 160 MG tablet Take 1 tablet by mouth  daily  . fish oil-omega-3 fatty acids 1000 MG capsule Take 3 g by mouth daily.   . fluticasone (FLONASE) 50 MCG/ACT nasal spray Place 2 sprays into both nostrils daily. As needed  . hydrochlorothiazide (MICROZIDE) 12.5 MG capsule Take 1 capsule (12.5 mg total) by mouth daily.  . Multiple Vitamin (MULTIVITAMIN) tablet Take 1 tablet by mouth daily.  . naproxen sodium (ANAPROX) 220 MG tablet Take 220 mg by mouth as needed.  . rosuvastatin (CRESTOR) 5 MG tablet Take 1 tablet by mouth at   bedtime  . triamcinolone ointment (KENALOG) 0.1 % APPLY ON THE SKIN AT BEDTIME TO AFFECTED AREAS OF HANDS.  AVOID FACE, GROIN, AND UNDERARMS  . [DISCONTINUED] benazepril (LOTENSIN) 20 MG tablet Take 1 tablet by mouth  daily  . [DISCONTINUED] fenofibrate 160 MG tablet Take 1 tablet by mouth  daily  . [DISCONTINUED] hydrochlorothiazide (MICROZIDE) 12.5 MG capsule Take 1 capsule (12.5 mg total) by mouth daily.  . [DISCONTINUED] rosuvastatin (CRESTOR) 5 MG tablet Take 1 tablet by mouth at   bedtime   No facility-administered encounter medications on file as of 07/14/2016.     Activities of Daily Living In your present state of health, do you have any difficulty performing the following activities: 07/14/2016  Hearing? N  Vision? N  Difficulty concentrating or making decisions? N  Walking or climbing stairs? N  Dressing or bathing? N  Doing errands, shopping? N  Preparing Food and eating ? N  Using the Toilet? N  In the past six months, have you accidently leaked urine? N  Do you have problems with loss of bowel control? N  Managing your Medications? N  Managing your Finances? N  Housekeeping or managing your Housekeeping? N  Some recent data might be hidden    Patient Care Team: [name], MD as PCP - General (Family Medicine) [name], MD as Consulting Physician (Orthopedic Surgery) Ralene Bathe, MD as Consulting Physician (Dermatology) Johnnette Litter, MD as Consulting Physician (Dentistry) Thelma Comp, OD as Consulting Physician (Optometry)    Assessment:     Hearing Screening   125Hz  250Hz  500Hz  1000Hz  2000Hz  3000Hz  4000Hz  6000Hz  8000Hz   Right ear:   40 40 40  40    Left ear:   40 40 40  40    Vision Screening Comments: Last vision exam in August 2017 with Dr. Maryruth Hancock B.    Exercise Activities and Dietary recommendations Current Exercise Habits: Home exercise routine, Type of exercise: walking;Other - see comments (house and yard work), Time (Minutes): 30, Frequency (Times/Week): 2, Weekly Exercise (Minutes/Week): 60, Intensity: Mild, Exercise limited by: None  identified  Goals    . Increase physical activity          Starting 07/14/2016, I will continue to exercise for at least 30 min twice weekly.       Fall Risk Fall Risk  07/14/2016 07/05/2015 06/30/2014 06/02/2013  Falls in the past year? No No No No   Depression Screen PHQ 2/9 Scores 07/14/2016 07/05/2015 06/30/2014 06/02/2013  PHQ - 2 Score 0 0 0 0     Cognitive Testing MMSE -  Mini Mental State Exam 07/14/2016  Orientation to time 5  Orientation to Place 5  Registration 3  Attention/ Calculation 0  Recall 3  Language- name 2 objects 0  Language- repeat 1  Language- follow 3 step command 3  Language- read & follow direction 0  Write a sentence 0  Copy design 0  Total score 20   PLEASE NOTE: A Mini-Cog screen was completed. Maximum score is 20. A value of 0 denotes this part of [place] MMSE was not completed or the patient failed this part of the Mini-Cog screening.   Mini-Cog Screening Orientation to Time - Max 5 pts Orientation to Place - Max 5 pts Registration - Max 3 pts Recall - Max 3 pts Language Repeat - Max 1 pts Language Follow 3 Step Command - Max 3 pts  Immunization History  Administered Date(s) Administered  . Influenza,inj,Quad PF,36+ Mos 08/02/2014, 07/05/2015, 07/14/2016  . Influenza-Unspecified 08/03/2013  . Pneumococcal Conjugate-13 06/30/2014  . Pneumococcal Polysaccharide-23 11/03/2002, 09/20/2007, 05/31/2012  . Td 01/20/2005  . Zoster 11/03/2008   Screening Tests Health Maintenance  Topic Date Due  . Hepatitis C Screening  07/31/2016 (Originally 1946-07-06)  . DTaP/Tdap/Td (1 - Tdap) 07/14/2017 (Originally 01/21/2005)  . TETANUS/TDAP  07/14/2017 (Originally 01/21/2015)  . MAMMOGRAM  08/15/2016  . HEMOGLOBIN A1C  10/02/2016  . FOOT EXAM  01/02/2017  . OPHTHALMOLOGY EXAM  06/12/2017  . COLONOSCOPY  08/31/2017  . INFLUENZA VACCINE  Addressed  . DEXA SCAN  Completed  . ZOSTAVAX  Completed  . PNA vac Low Risk Adult  Completed      Plan:     I  have personally reviewed and addressed the Medicare Annual Wellness questionnaire and have noted the following in the patient's chart:  A. Medical and social history B. Use of alcohol, tobacco or illicit drugs  C. Current medications and supplements D. Functional ability and status E.  Nutritional status F.  Physical activity G. Advance directives H. List of other physicians I.  Hospitalizations, surgeries, and ER visits in previous 12 months J.  [place] to include hearing, vision, cognitive, depression L. Referrals and appointments - none  In addition, I have reviewed and discussed with patient certain preventive protocols, quality metrics, and best practice recommendations. A written personalized care plan for preventive services as well as general preventive health recommendations were provided to patient.  See attached scanned questionnaire for additional information.   Signed,   Lindell Noe, MHA, BS, LPN Health Advisor

## 2016-07-14 NOTE — Progress Notes (Signed)
PCP notes:   Health maintenance:  Flu vaccine - administered Hep C screening - will be completed with future labs Tetanus - postponed/insurance  Abnormal screenings:   None  Patient concerns:   Pt requested medication refills. Refills have been ordered.   Nurse concerns:  None  Next PCP appt:   08/07/2016

## 2016-07-14 NOTE — Progress Notes (Signed)
Pre visit review using our clinic review tool, if applicable. No additional management support is needed unless otherwise documented below in the visit note. 

## 2016-07-14 NOTE — Patient Instructions (Signed)
Katherine Oliver , Thank you for taking time to come for your Medicare Wellness Visit. I appreciate your ongoing commitment to your health goals. Please review the following plan we discussed and let me know if I can assist you in the future.   These are the goals we discussed: Goals    . Increase physical activity          Starting 07/14/2016, I will continue to exercise for at least 30 min twice weekly.        This is a list of the screening recommended for you and due dates:  Health Maintenance  Topic Date Due  .  Hepatitis C: One time screening is recommended by Center for Disease Control  (CDC) for  adults born from [birth year] through 1967.   07/31/2016*  . DTaP/Tdap/Td vaccine (1 - Tdap) 07/14/2017*  . Tetanus Vaccine  07/14/2017*  . Mammogram  08/15/2016  . Hemoglobin A1C  10/02/2016  . Complete foot exam   01/02/2017  . Eye exam for diabetics  06/12/2017  . Colon Cancer Screening  08/31/2017  . Flu Shot  Addressed  . DEXA scan (bone density measurement)  Completed  . Shingles Vaccine  Completed  . Pneumonia vaccines  Completed  *Topic was postponed. The date shown is not the original due date.   Preventive Care for Adults  A healthy lifestyle and preventive care can promote health and wellness. Preventive health guidelines for adults include the following key practices.  . A routine yearly physical is a good way to check with your health care provider about your health and preventive screening. It is a chance to share any concerns and updates on your health and to receive a thorough exam.  . Visit your dentist for a routine exam and preventive care every 6 months. Brush your teeth twice a day and floss once a day. Good oral hygiene prevents tooth decay and gum disease.  . The frequency of eye exams is based on your age, health, family medical history, use  of contact lenses, and other factors. Follow your health care provider's ecommendations for frequency of eye exams.  . Eat  a healthy diet. Foods like vegetables, fruits, whole grains, low-fat dairy products, and lean protein foods contain the nutrients you need without too many calories. Decrease your intake of foods high in solid fats, added sugars, and salt. Eat the right amount of calories for you. Get information about a proper diet from your health care provider, if necessary.  . Regular physical exercise is one of the most important things you can do for your health. Most adults should get at least 150 minutes of moderate-intensity exercise (any activity that increases your heart rate and causes you to sweat) each week. In addition, most adults need muscle-strengthening exercises on 2 or more days a week.  Silver Sneakers may be a benefit available to you. To determine eligibility, you may visit the website: www.silversneakers.com or contact program at [phone]) [phone] Mon-Fri between 8AM-8PM.   . Maintain a healthy weight. The body mass index (BMI) is a screening tool to identify possible weight problems. It provides an estimate of body fat based on height and weight. Your health care provider can find your BMI and can help you achieve or maintain a healthy weight.   For adults 20 years and older: ? A BMI below 18.5 is considered underweight. ? A BMI of 18.5 to 24.9 is normal. ? A BMI of 25 to 29.9 is considered overweight. ? A  BMI of 30 and above is considered obese.   . Maintain normal blood lipids and cholesterol levels by exercising and minimizing your intake of saturated fat. Eat a balanced diet with plenty of fruit and vegetables. Blood tests for lipids and cholesterol should begin at age 53 and be repeated every 5 years. If your lipid or cholesterol levels are high, you are over 50, or you are at high risk for heart disease, you may need your cholesterol levels checked more frequently. Ongoing high lipid and cholesterol levels should be treated with medicines if diet and exercise are not working.  . If you  smoke, find out from your health care provider how to quit. If you do not use tobacco, please do not start.  . If you choose to drink alcohol, please do not consume more than 2 drinks per day. One drink is considered to be 12 ounces (355 mL) of beer, 5 ounces (148 mL) of wine, or 1.5 ounces (44 mL) of liquor.  . If you are 72-17 years old, ask your health care provider if you should take aspirin to prevent strokes.  . Use sunscreen. Apply sunscreen liberally and repeatedly throughout the day. You should seek shade when your shadow is shorter than you. Protect yourself by wearing long sleeves, pants, a wide-brimmed hat, and sunglasses year round, whenever you are outdoors.  . Once a month, do a whole body skin exam, using a mirror to look at the skin on your back. Tell your health care provider of new moles, moles that have irregular borders, moles that are larger than a pencil eraser, or moles that have changed in shape or color.

## 2016-07-16 NOTE — Progress Notes (Signed)
I reviewed health advisor's note, was available for consultation, and agree with documentation and plan.  

## 2016-07-23 ENCOUNTER — Other Ambulatory Visit: Payer: Self-pay | Admitting: Family Medicine

## 2016-07-23 DIAGNOSIS — Z1231 Encounter for screening mammogram for malignant neoplasm of breast: Secondary | ICD-10-CM

## 2016-07-30 ENCOUNTER — Other Ambulatory Visit: Payer: Self-pay | Admitting: Family Medicine

## 2016-07-30 DIAGNOSIS — E785 Hyperlipidemia, unspecified: Secondary | ICD-10-CM

## 2016-07-30 DIAGNOSIS — N183 Chronic kidney disease, stage 3 unspecified: Secondary | ICD-10-CM

## 2016-07-30 DIAGNOSIS — K760 Fatty (change of) liver, not elsewhere classified: Secondary | ICD-10-CM

## 2016-07-30 DIAGNOSIS — I10 Essential (primary) hypertension: Secondary | ICD-10-CM

## 2016-07-30 DIAGNOSIS — D751 Secondary polycythemia: Secondary | ICD-10-CM

## 2016-07-30 DIAGNOSIS — E1121 Type 2 diabetes mellitus with diabetic nephropathy: Secondary | ICD-10-CM

## 2016-07-30 DIAGNOSIS — E1122 Type 2 diabetes mellitus with diabetic chronic kidney disease: Secondary | ICD-10-CM

## 2016-07-30 DIAGNOSIS — Z1159 Encounter for screening for other viral diseases: Secondary | ICD-10-CM

## 2016-07-31 ENCOUNTER — Other Ambulatory Visit (INDEPENDENT_AMBULATORY_CARE_PROVIDER_SITE_OTHER): Payer: Medicare Other

## 2016-07-31 DIAGNOSIS — N183 Chronic kidney disease, stage 3 unspecified: Secondary | ICD-10-CM

## 2016-07-31 DIAGNOSIS — E1121 Type 2 diabetes mellitus with diabetic nephropathy: Secondary | ICD-10-CM

## 2016-07-31 DIAGNOSIS — I10 Essential (primary) hypertension: Secondary | ICD-10-CM

## 2016-07-31 DIAGNOSIS — Z1159 Encounter for screening for other viral diseases: Secondary | ICD-10-CM

## 2016-07-31 DIAGNOSIS — E1122 Type 2 diabetes mellitus with diabetic chronic kidney disease: Secondary | ICD-10-CM | POA: Diagnosis not present

## 2016-07-31 DIAGNOSIS — K760 Fatty (change of) liver, not elsewhere classified: Secondary | ICD-10-CM | POA: Diagnosis not present

## 2016-07-31 DIAGNOSIS — D751 Secondary polycythemia: Secondary | ICD-10-CM | POA: Diagnosis not present

## 2016-07-31 DIAGNOSIS — E785 Hyperlipidemia, unspecified: Secondary | ICD-10-CM

## 2016-07-31 LAB — COMPREHENSIVE METABOLIC PANEL
ALBUMIN: 4.4 g/dL (ref 3.5–5.2)
ALT: 66 U/L — ABNORMAL HIGH (ref 0–35)
AST: 38 U/L — AB (ref 0–37)
Alkaline Phosphatase: 43 U/L (ref 39–117)
BUN: 26 mg/dL — AB (ref 6–23)
CHLORIDE: 103 meq/L (ref 96–112)
CO2: 28 mEq/L (ref 19–32)
CREATININE: 1.22 mg/dL — AB (ref 0.40–1.20)
Calcium: 10 mg/dL (ref 8.4–10.5)
GFR: 46.05 mL/min — ABNORMAL LOW (ref >60.00)
GLUCOSE: 133 mg/dL — AB (ref 70–99)
Potassium: 4.6 mEq/L (ref 3.5–5.1)
SODIUM: 140 meq/L (ref 135–145)
TOTAL PROTEIN: 7.3 g/dL (ref 6.0–8.3)
Total Bilirubin: 0.4 mg/dL (ref 0.2–1.2)

## 2016-07-31 LAB — CBC WITH DIFFERENTIAL/PLATELET
Basophils Absolute: 0 10*3/uL (ref 0.0–0.1)
Basophils Relative: 0.6 % (ref 0.0–3.0)
EOS PCT: 0 % (ref 0.0–5.0)
Eosinophils Absolute: 0 10*3/uL (ref 0.0–0.7)
HCT: 44.3 % (ref 36.0–46.0)
Hemoglobin: 15.2 g/dL — ABNORMAL HIGH (ref 12.0–15.0)
LYMPHS ABS: 1.5 10*3/uL (ref 0.7–4.0)
Lymphocytes Relative: 25.1 % (ref 12.0–46.0)
MCHC: 34.3 g/dL (ref 30.0–36.0)
MCV: 83.3 fl (ref 78.0–100.0)
MONO ABS: 0.6 10*3/uL (ref 0.1–1.0)
Monocytes Relative: 9.8 % (ref 3.0–12.0)
NEUTROS ABS: 3.9 10*3/uL (ref 1.4–7.7)
NEUTROS PCT: 64.5 % (ref 43.0–77.0)
PLATELETS: 235 10*3/uL (ref 150.0–400.0)
RBC: 5.32 Mil/uL — AB (ref 3.87–5.11)
RDW: 13.7 % (ref 11.5–15.5)
WBC: 6 10*3/uL (ref 4.0–10.5)

## 2016-07-31 LAB — LIPID PANEL
Cholesterol: 158 mg/dL (ref 0–200)
HDL: 44.3 mg/dL (ref >39.00)
NonHDL: 113.32
Total CHOL/HDL Ratio: 4
Triglycerides: 249 mg/dL — ABNORMAL HIGH (ref 0.0–149.0)
VLDL: 49.8 mg/dL — AB (ref 0.0–40.0)

## 2016-07-31 LAB — HEMOGLOBIN A1C: HEMOGLOBIN A1C: 6.2 % (ref 4.6–6.5)

## 2016-07-31 LAB — VITAMIN D 25 HYDROXY (VIT D DEFICIENCY, FRACTURES): VITD: 36.23 ng/mL (ref 30.00–100.00)

## 2016-07-31 LAB — LDL CHOLESTEROL, DIRECT: Direct LDL: 88 mg/dL

## 2016-08-01 LAB — HEPATITIS C ANTIBODY: HCV Ab: NEGATIVE

## 2016-08-07 ENCOUNTER — Encounter: Payer: Self-pay | Admitting: Family Medicine

## 2016-08-07 ENCOUNTER — Ambulatory Visit (INDEPENDENT_AMBULATORY_CARE_PROVIDER_SITE_OTHER): Payer: Medicare Other | Admitting: Family Medicine

## 2016-08-07 VITALS — BP 110/70 | HR 88 | Temp 98.2°F | Wt 181.5 lb

## 2016-08-07 DIAGNOSIS — N183 Chronic kidney disease, stage 3 unspecified: Secondary | ICD-10-CM

## 2016-08-07 DIAGNOSIS — E1122 Type 2 diabetes mellitus with diabetic chronic kidney disease: Secondary | ICD-10-CM

## 2016-08-07 DIAGNOSIS — I10 Essential (primary) hypertension: Secondary | ICD-10-CM

## 2016-08-07 DIAGNOSIS — E782 Mixed hyperlipidemia: Secondary | ICD-10-CM | POA: Diagnosis not present

## 2016-08-07 DIAGNOSIS — E1121 Type 2 diabetes mellitus with diabetic nephropathy: Secondary | ICD-10-CM

## 2016-08-07 DIAGNOSIS — M25551 Pain in right hip: Secondary | ICD-10-CM

## 2016-08-07 DIAGNOSIS — E669 Obesity, unspecified: Secondary | ICD-10-CM

## 2016-08-07 DIAGNOSIS — K760 Fatty (change of) liver, not elsewhere classified: Secondary | ICD-10-CM

## 2016-08-07 DIAGNOSIS — Z7189 Other specified counseling: Secondary | ICD-10-CM

## 2016-08-07 DIAGNOSIS — M8580 Other specified disorders of bone density and structure, unspecified site: Secondary | ICD-10-CM

## 2016-08-07 DIAGNOSIS — D751 Secondary polycythemia: Secondary | ICD-10-CM

## 2016-08-07 NOTE — Assessment & Plan Note (Signed)
Advanced directives: husband [name] is [place]. DPOA in chart (05/2014). Advanced packet provided today.

## 2016-08-07 NOTE — Assessment & Plan Note (Signed)
Chronic, stable. Continue diet control.

## 2016-08-07 NOTE — Assessment & Plan Note (Signed)
Chronic, stable. Continue current regimen. 

## 2016-08-07 NOTE — Assessment & Plan Note (Signed)
Chronic, very mild. Continue to monitor.

## 2016-08-07 NOTE — Assessment & Plan Note (Signed)
Chronic. Reviewed #s with patient, discussed diet changes to improve levels. Continue crestor, fenofibrate, fish oil 4gm daily.

## 2016-08-07 NOTE — Assessment & Plan Note (Signed)
Continue to encourage weight loss through healthy diet and lifestyle changes.

## 2016-08-07 NOTE — Progress Notes (Signed)
Pre visit review using our clinic review tool, if applicable. No additional management support is needed unless otherwise documented below in the visit note. 

## 2016-08-07 NOTE — Assessment & Plan Note (Signed)
Chronic, stable. Consider SPEP next visit if not already done.

## 2016-08-07 NOTE — Assessment & Plan Note (Signed)
Continue 2 tablets calcium/vit D. Recent level normal.

## 2016-08-07 NOTE — Patient Instructions (Addendum)
Advanced planning packet provided today.  Try stretching exercises provided today for possible sciatica.  You are doing well today ,return as needed or in 6 months for follow up visit.

## 2016-08-07 NOTE — Progress Notes (Signed)
BP 110/70   Pulse 88   Temp 98.2 F (36.8 C) (Oral)   Wt 181 lb 8 oz (82.3 kg)   BMI 34.29 kg/m    CC: f/u after medicare Subjective:    Patient ID: Katherine Oliver, female    DOB: 10-10-1946, 70 y.o.   MRN: [id number]  HPI: Katherine Oliver is a 70 y.o. female presenting on 08/07/2016 for Annual Exam   Saw Katha Cabal last month for medicare wellness visit, note reviewed.   Ongoing periodic pain of R posterior hip and anterior thigh over the last year, worse in the morning when she wakes up. Worse with weather changes. Treats with tylenol and aleve.   Preventative: Colon 08/2012 - adenomatous polyps, diverticulosis, rec rpt 5 yrs [name]). Father with colon cancer age 22s. Mammogram - 08/2014 abnormal - s/p normal core biopsy and f/u US 45/21016. mammo 08/2015 WNL. Scheduled for this year  Pap smear - stopped around 70yo, s/p hysterectomy for heavy bleeding. Both ovaries remain. Declines continued pelvic exam, discussed changes to monitor for.  Dexa Date: 06/2013 [facility] - Tscore -1.1 forearm, normal spine and femur Flu shot yearly Pneumovax 05/2012, prevnar 2015 Td 2006  Shingles shot 2010  Advanced directives: husband [name] is [place]. DPOA in chart (05/2014). Advanced packet provided today.  Seat belt use discussed  Sunscreen use discussed. Changing spots on skin. Would like referral to derm. 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 Daily walking about 20 min.  Diet: ok water intake 4 glasses/day, daily fruits/vegetables, red meat 4x/wk, fish 3-4x/wk   Relevant past medical, surgical, family and social history reviewed and updated as indicated. Interim medical history since our last visit reviewed. Allergies and medications reviewed and updated. Current  Outpatient Prescriptions on File Prior to Visit  Medication Sig  . amLODipine (NORVASC) 10 MG tablet Take 1 tablet by mouth  daily  . aspirin 81 MG tablet Take 81 mg by mouth daily.  . benazepril (LOTENSIN) 20 MG tablet Take 1 tablet by mouth  daily  . Calcium Carbonate-Vitamin D (CALCIUM 500/VITAMIN D PO) Take 2 tablets by mouth daily.  . diphenhydrAMINE (BENADRYL) 25 MG tablet Take 25 mg by mouth at bedtime as needed.  . fenofibrate 160 MG tablet Take 1 tablet by mouth  daily  . fish oil-omega-3 fatty acids 1000 MG capsule Take 3 g by mouth daily.   . fluticasone (FLONASE) 50 MCG/ACT nasal spray Place 2 sprays into both nostrils daily. As needed  . hydrochlorothiazide (MICROZIDE) 12.5 MG capsule Take 1 capsule (12.5 mg total) by mouth daily.  . Multiple Vitamin (MULTIVITAMIN) tablet Take 1 tablet by mouth daily.  . naproxen sodium (ANAPROX) 220 MG tablet Take 220 mg by mouth as needed.  . rosuvastatin (CRESTOR) 5 MG tablet Take 1 tablet by mouth at   bedtime  . triamcinolone ointment (KENALOG) 0.1 % APPLY ON THE SKIN AT BEDTIME TO AFFECTED AREAS OF HANDS. AVOID FACE, GROIN, AND UNDERARMS   No current facility-administered medications on file prior to visit.     Review of Systems Per HPI unless specifically indicated in ROS section     Objective:    BP 110/70   Pulse 88   Temp 98.2 F (36.8 C) (Oral)   Wt 181 lb 8 oz (82.3 kg)   BMI  34.29 kg/m   Wt Readings from Last 3 Encounters:  08/07/16 181 lb 8 oz (82.3 kg)  07/14/16 182 lb 8 oz (82.8 kg)  04/14/16 178 lb (80.7 kg)    Physical Exam  Constitutional: She is oriented to person, place, and time. She appears well-developed and well-nourished. No distress.  HENT:  Head: Normocephalic and atraumatic.  Right Ear: Hearing, tympanic membrane, external ear and ear canal normal.  Left Ear: Hearing, tympanic membrane, external ear and ear canal normal.  Nose: Nose normal.  Mouth/Throat: Uvula is midline, oropharynx is clear and  moist and mucous membranes are normal. No oropharyngeal exudate, posterior oropharyngeal edema or posterior oropharyngeal erythema.  Eyes: Conjunctivae and EOM are normal. Pupils are equal, round, and reactive to light. No scleral icterus.  Neck: Normal range of motion. Neck supple.  Cardiovascular: Normal rate, regular rhythm, normal heart sounds and intact distal pulses.   No murmur heard. Pulses:      Radial pulses are 2+ on the right side, and 2+ on the left side.  Pulmonary/Chest: Effort normal and breath sounds normal. No respiratory distress. She has no wheezes. She has no rales.  Abdominal: Soft. Bowel sounds are normal. She exhibits no distension and no mass. There is no tenderness. There is no rebound and no guarding.  Musculoskeletal: Normal range of motion. She exhibits no edema.  Neg SLR bilaterally  No pain with int/ext rotation at hip  + FABER on right  No pain at SIJ, GTB bilaterally. Tender R sciatic notch to palpation  Lymphadenopathy:    She has no cervical adenopathy.  Neurological: She is alert and oriented to person, place, and time.  CN grossly intact, station and gait intact  Skin: Skin is warm and dry. No rash noted.  Multiple SKs throughout  Psychiatric: She has a normal mood and affect. Her behavior is normal. Judgment and thought content normal.  Nursing note and vitals reviewed.  Results for orders placed or performed in visit on 07/31/16  Hepatitis C antibody  Result Value Ref Range   HCV Ab NEGATIVE NEGATIVE  Lipid panel  Result Value Ref Range   Cholesterol 158 0 - 200 mg/dL   Triglycerides 249.0 (H) 0.0 - 149.0 mg/dL   HDL 44.30 >39.00 mg/dL   VLDL 49.8 (H) 0.0 - 40.0 mg/dL   Total CHOL/HDL Ratio 4    NonHDL 113.32   Comprehensive metabolic panel  Result Value Ref Range   Sodium 140 135 - 145 mEq/L   Potassium 4.6 3.5 - 5.1 mEq/L   Chloride 103 96 - 112 mEq/L   CO2 28 19 - 32 mEq/L   Glucose, Bld 133 (H) 70 - 99 mg/dL   BUN 26 (H) 6 - 23  mg/dL   Creatinine, Ser 1.22 (H) 0.40 - 1.20 mg/dL   Total Bilirubin 0.4 0.2 - 1.2 mg/dL   Alkaline Phosphatase 43 39 - 117 U/L   AST 38 (H) 0 - 37 U/L   ALT 66 (H) 0 - 35 U/L   Total Protein 7.3 6.0 - 8.3 g/dL   Albumin 4.4 3.5 - 5.2 g/dL   Calcium 10.0 8.4 - 10.5 mg/dL   GFR 46.05 (L) >60.00 mL/min  Hemoglobin A1c  Result Value Ref Range   Hgb A1c MFr Bld 6.2 4.6 - 6.5 %  CBC with Differential/Platelet  Result Value Ref Range   WBC 6.0 4.0 - 10.5 K/uL   RBC 5.32 (H) 3.87 - 5.11 Mil/uL   Hemoglobin 15.2 (H) 12.0 -  15.0 g/dL   HCT 44.3 36.0 - 46.0 %   MCV 83.3 78.0 - 100.0 fl   MCHC 34.3 30.0 - 36.0 g/dL   RDW 13.7 11.5 - 15.5 %   Platelets 235.0 150.0 - 400.0 K/uL   Neutrophils Relative % 64.5 43.0 - 77.0 %   Lymphocytes Relative 25.1 12.0 - 46.0 %   Monocytes Relative 9.8 3.0 - 12.0 %   Eosinophils Relative 0.0 0.0 - 5.0 %   Basophils Relative 0.6 0.0 - 3.0 %   Neutro Abs 3.9 1.4 - 7.7 K/uL   Lymphs Abs 1.5 0.7 - 4.0 K/uL   Monocytes Absolute 0.6 0.1 - 1.0 K/uL   Eosinophils Absolute 0.0 0.0 - 0.7 K/uL   Basophils Absolute 0.0 0.0 - 0.1 K/uL  VITAMIN D 25 Hydroxy (Vit-D Deficiency, Fractures)  Result Value Ref Range   VITD 36.23 30.00 - 100.00 ng/mL  LDL cholesterol, direct  Result Value Ref Range   Direct LDL 88.0 mg/dL      Assessment & Plan:  Over 25 minutes were spent face-to-face with the patient during this encounter and >50% of that time was spent on counseling and coordination of care  Problem List Items Addressed This Visit    Advanced care planning/counseling discussion    Advanced directives: husband [name] is [place]. DPOA in chart (05/2014). Advanced packet provided today.       CKD stage 3 due to type 2 diabetes mellitus (HCC)    Chronic, stable. Consider SPEP next visit if not already done.       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 nephropathy (HCC)    Chronic, stable. Continue diet control.      HLD (hyperlipidemia)    Chronic.  Reviewed #s with patient, discussed diet changes to improve levels. Continue crestor, fenofibrate, fish oil 4gm daily.      HTN (hypertension)    Chronic, stable. Continue current regimen      NAFLD (nonalcoholic fatty liver disease)    Stable period, mildly elevated AST/ALT.  On 3 drug cholesterol regimen.      Obesity, Class I, BMI 30-34.9    Continue to encourage weight loss through healthy diet and lifestyle changes.       Osteopenia    Continue 2 tablets calcium/vit D. Recent level normal.       Polycythemia    Chronic, very mild. Continue to monitor.      Right hip pain - Primary    Exam most consistent with sciatica and not hip OA - provided with exercises from [facility] pt advisor on piriformis syndrome. Update if not improving with treatment.        Other Visit Diagnoses   None.      Follow up plan: Return in about 6 months (around 02/05/2017) for follow up visit.  [name], MD

## 2016-08-07 NOTE — Assessment & Plan Note (Addendum)
Stable period, mildly elevated AST/ALT.  On 3 drug cholesterol regimen.

## 2016-08-07 NOTE — Assessment & Plan Note (Signed)
Exam most consistent with sciatica and not hip OA - provided with exercises from [facility] pt advisor on piriformis syndrome. Update if not improving with treatment.

## 2016-08-18 ENCOUNTER — Ambulatory Visit
Admission: RE | Admit: 2016-08-18 | Discharge: 2016-08-18 | Disposition: A | Discharge status: Home / Self Care | Payer: Medicare Other | Source: Ambulatory Visit | Attending: Family Medicine | Admitting: Family Medicine

## 2016-08-18 ENCOUNTER — Other Ambulatory Visit: Payer: Self-pay | Admitting: Family Medicine

## 2016-08-18 DIAGNOSIS — Z1231 Encounter for screening mammogram for malignant neoplasm of breast: Secondary | ICD-10-CM

## 2016-08-19 ENCOUNTER — Encounter: Payer: Self-pay | Admitting: *Deleted

## 2016-08-19 LAB — HM MAMMOGRAPHY

## 2016-11-04 ENCOUNTER — Ambulatory Visit (INDEPENDENT_AMBULATORY_CARE_PROVIDER_SITE_OTHER): Payer: Medicare Other | Admitting: Family Medicine

## 2016-11-04 ENCOUNTER — Encounter: Payer: Self-pay | Admitting: Family Medicine

## 2016-11-04 VITALS — BP 114/60 | HR 99 | Temp 98.6°F | Wt 183.0 lb

## 2016-11-04 DIAGNOSIS — J208 Acute bronchitis due to other specified organisms: Secondary | ICD-10-CM

## 2016-11-04 DIAGNOSIS — B9689 Other specified bacterial agents as the cause of diseases classified elsewhere: Secondary | ICD-10-CM | POA: Diagnosis not present

## 2016-11-04 MED ORDER — AZITHROMYCIN 250 MG PO TABS: ORAL_TABLET | ORAL | 0 refills | Status: DC

## 2016-11-04 MED ORDER — GUAIFENESIN-CODEINE 100-10 MG/5ML PO SYRP: 5.0000 mL | ORAL_SOLUTION | Freq: Two times a day (BID) | ORAL | 0 refills | Status: DC | PRN

## 2016-11-04 NOTE — Patient Instructions (Addendum)
I do think you are developing bronchitis.  Treat with zpack. Push fluids and rest. Continue current over the counter regimen. May use codeine cough syrup as well.  Let us know if fever > 101, worsening productive cough or not improving as expected.

## 2016-11-04 NOTE — Progress Notes (Signed)
Pre visit review using our clinic review tool, if applicable. No additional management support is needed unless otherwise documented below in the visit note. 

## 2016-11-04 NOTE — Assessment & Plan Note (Signed)
Anticipate acute bronchitis - given duration and progression of symptoms, cover for atypical and bacterial cause with zpack. Codeine cough syrup PRN. Update if not improving with treatment. Pt agrees with plan.

## 2016-11-04 NOTE — Progress Notes (Signed)
BP 114/60 (BP Location: Left Arm, Patient Position: Sitting, Cuff Size: Large)   Pulse 99   Temp 98.6 F (37 C) (Oral)   Wt 183 lb (83 kg)   SpO2 94%   BMI 34.58 kg/m    CC: head cold Subjective:    Patient ID: Katherine Oliver, female    DOB: 25-Jun-1946, 71 y.o.   MRN: [id number]  HPI: Katherine Oliver is a 71 y.o. female presenting on 11/04/2016 for URI (Started about 2 weeks ago with nasal congestion and sore throat. Gradually moved to chest and has a cough and chest congestion. Has tried Dayquil Nyquil, and Mucinex.)   Initial head cold for 10 days initially improved for 2 days but then over the past week again worsening. Cough and chest congestion and pain with coughing. + ST, PNdrainage. Nasal congestion. Some trouble sleeping at night time.   No fevers/chills, ear or tooth pain, headache. No nausea, dyspnea or wheezing.   Using guaifenesin, daytime and night time [facility] OTC remedy. Also using nasal saline spray.   Husband also sick at home. Son was initially ill as well.  No asthma history Non smoker  Relevant past medical, surgical, family and social history reviewed and updated as indicated. Interim medical history since our last visit reviewed. Allergies and medications reviewed and updated. Current Outpatient Prescriptions on File Prior to Visit  Medication Sig  . amLODipine (NORVASC) 10 MG tablet Take 1 tablet by mouth  daily  . aspirin 81 MG tablet Take 81 mg by mouth daily.  . benazepril (LOTENSIN) 20 MG tablet Take 1 tablet by mouth  daily  . Calcium Carbonate-Vitamin D (CALCIUM 500/VITAMIN D PO) Take 2 tablets by mouth daily.  . diphenhydrAMINE (BENADRYL) 25 MG tablet Take 25 mg by mouth at bedtime as needed.  . fenofibrate 160 MG tablet Take 1 tablet by mouth  daily  . fish oil-omega-3 fatty acids 1000 MG capsule Take 3 g by mouth daily.   . fluticasone (FLONASE) 50 MCG/ACT nasal spray Place 2 sprays into both nostrils daily. As needed  .  hydrochlorothiazide (MICROZIDE) 12.5 MG capsule Take 1 capsule (12.5 mg total) by mouth daily.  . Multiple Vitamin (MULTIVITAMIN) tablet Take 1 tablet by mouth daily.  . naproxen sodium (ANAPROX) 220 MG tablet Take 220 mg by mouth as needed.  . rosuvastatin (CRESTOR) 5 MG tablet Take 1 tablet by mouth at   bedtime  . triamcinolone ointment (KENALOG) 0.1 % APPLY ON THE SKIN AT BEDTIME TO AFFECTED AREAS OF HANDS. AVOID FACE, GROIN, AND UNDERARMS   No current facility-administered medications on file prior to visit.     Review of Systems Per HPI unless specifically indicated in ROS section     Objective:    BP 114/60 (BP Location: Left Arm, Patient Position: Sitting, Cuff Size: Large)   Pulse 99   Temp 98.6 F (37 C) (Oral)   Wt 183 lb (83 kg)   SpO2 94%   BMI 34.58 kg/m   Wt Readings from Last 3 Encounters:  11/04/16 183 lb (83 kg)  08/07/16 181 lb 8 oz (82.3 kg)  07/14/16 182 lb 8 oz (82.8 kg)    Physical Exam  Constitutional: She appears well-developed and well-nourished. No distress.  HENT:  Head: Normocephalic and atraumatic.  Right Ear: Hearing, tympanic membrane, external ear and ear canal normal.  Left Ear: Hearing, tympanic membrane, external ear and ear canal normal.  Nose: Mucosal edema present. No rhinorrhea. Right sinus exhibits no maxillary  sinus tenderness and no frontal sinus tenderness. Left sinus exhibits no maxillary sinus tenderness and no frontal sinus tenderness.  Mouth/Throat: Uvula is midline, oropharynx is clear and moist and mucous membranes are normal. No oropharyngeal exudate, posterior oropharyngeal edema, posterior oropharyngeal erythema or tonsillar abscesses.  Marked nasal mucosal erythema and purulent discharge  Eyes: Conjunctivae and EOM are normal. Pupils are equal, round, and reactive to light. No scleral icterus.  Neck: Normal range of motion. Neck supple.  Cardiovascular: Normal rate, regular rhythm and intact distal pulses.   Murmur (faint  systolic) heard. Pulmonary/Chest: Effort normal and breath sounds normal. No respiratory distress. She has no wheezes. She has no rales.  Lymphadenopathy:    She has no cervical adenopathy.  Skin: Skin is warm and dry. No rash noted.  Nursing note and vitals reviewed.  Results for orders placed or performed in visit on 08/19/16  [facility] MAMMOGRAPHY  Result Value Ref Range   HM Mammogram 0-4 Bi-Rad 0-4 Bi-Rad, Self Reported Normal      Assessment & Plan:   Problem List Items Addressed This Visit    Acute bacterial bronchitis - Primary    Anticipate acute bronchitis - given duration and progression of symptoms, cover for atypical and bacterial cause with zpack. Codeine cough syrup PRN. Update if not improving with treatment. Pt agrees with plan.      Relevant Medications   azithromycin (ZITHROMAX) 250 MG tablet       Follow up plan: Return if symptoms worsen or fail to improve.  [name], MD

## 2016-12-17 ENCOUNTER — Other Ambulatory Visit: Payer: Self-pay | Admitting: Family Medicine

## 2017-02-01 HISTORY — PX: TRIGGER FINGER RELEASE: SHX641

## 2017-02-05 ENCOUNTER — Encounter: Payer: Self-pay | Admitting: Family Medicine

## 2017-02-05 ENCOUNTER — Ambulatory Visit (INDEPENDENT_AMBULATORY_CARE_PROVIDER_SITE_OTHER): Payer: Medicare Other | Admitting: Family Medicine

## 2017-02-05 VITALS — BP 132/76 | HR 84 | Temp 98.5°F | Wt 183.5 lb

## 2017-02-05 DIAGNOSIS — E1122 Type 2 diabetes mellitus with diabetic chronic kidney disease: Secondary | ICD-10-CM | POA: Diagnosis not present

## 2017-02-05 DIAGNOSIS — E1121 Type 2 diabetes mellitus with diabetic nephropathy: Secondary | ICD-10-CM | POA: Diagnosis not present

## 2017-02-05 DIAGNOSIS — E669 Obesity, unspecified: Secondary | ICD-10-CM

## 2017-02-05 DIAGNOSIS — R1032 Left lower quadrant pain: Secondary | ICD-10-CM

## 2017-02-05 DIAGNOSIS — E782 Mixed hyperlipidemia: Secondary | ICD-10-CM

## 2017-02-05 DIAGNOSIS — R109 Unspecified abdominal pain: Secondary | ICD-10-CM | POA: Insufficient documentation

## 2017-02-05 DIAGNOSIS — I10 Essential (primary) hypertension: Secondary | ICD-10-CM | POA: Diagnosis not present

## 2017-02-05 DIAGNOSIS — N183 Chronic kidney disease, stage 3 unspecified: Secondary | ICD-10-CM

## 2017-02-05 DIAGNOSIS — K760 Fatty (change of) liver, not elsewhere classified: Secondary | ICD-10-CM

## 2017-02-05 DIAGNOSIS — M65341 Trigger finger, right ring finger: Secondary | ICD-10-CM | POA: Diagnosis not present

## 2017-02-05 LAB — CBC WITH DIFFERENTIAL/PLATELET
BASOS PCT: 0.9 % (ref 0.0–3.0)
Basophils Absolute: 0.1 10*3/uL (ref 0.0–0.1)
EOS ABS: 0 10*3/uL (ref 0.0–0.7)
Eosinophils Relative: 0.1 % (ref 0.0–5.0)
HEMATOCRIT: 44.9 % (ref 36.0–46.0)
HEMOGLOBIN: 14.7 g/dL (ref 12.0–15.0)
Lymphocytes Relative: 20.7 % (ref 12.0–46.0)
Lymphs Abs: 1.4 10*3/uL (ref 0.7–4.0)
MCHC: 32.7 g/dL (ref 30.0–36.0)
MCV: 84.9 fl (ref 78.0–100.0)
Monocytes Absolute: 0.7 10*3/uL (ref 0.1–1.0)
Monocytes Relative: 9.9 % (ref 3.0–12.0)
NEUTROS ABS: 4.6 10*3/uL (ref 1.4–7.7)
Neutrophils Relative %: 68.4 % (ref 43.0–77.0)
PLATELETS: 233 10*3/uL (ref 150.0–400.0)
RBC: 5.28 Mil/uL — ABNORMAL HIGH (ref 3.87–5.11)
RDW: 14.6 % (ref 11.5–15.5)
WBC: 6.8 10*3/uL (ref 4.0–10.5)

## 2017-02-05 LAB — HEMOGLOBIN A1C: Hgb A1c MFr Bld: 6.8 % — ABNORMAL HIGH (ref 4.6–6.5)

## 2017-02-05 LAB — COMPREHENSIVE METABOLIC PANEL
ALBUMIN: 4.7 g/dL (ref 3.5–5.2)
ALT: 63 U/L — ABNORMAL HIGH (ref 0–35)
AST: 46 U/L — AB (ref 0–37)
Alkaline Phosphatase: 49 U/L (ref 39–117)
BUN: 24 mg/dL — ABNORMAL HIGH (ref 6–23)
CHLORIDE: 102 meq/L (ref 96–112)
CO2: 28 meq/L (ref 19–32)
CREATININE: 1.22 mg/dL — AB (ref 0.40–1.20)
Calcium: 10.4 mg/dL (ref 8.4–10.5)
GFR: 45.99 mL/min — ABNORMAL LOW (ref >60.00)
Glucose, Bld: 115 mg/dL — ABNORMAL HIGH (ref 70–99)
POTASSIUM: 4.2 meq/L (ref 3.5–5.1)
SODIUM: 139 meq/L (ref 135–145)
Total Bilirubin: 0.4 mg/dL (ref 0.2–1.2)
Total Protein: 7.3 g/dL (ref 6.0–8.3)

## 2017-02-05 NOTE — Progress Notes (Signed)
Pre visit review using our clinic review tool, if applicable. No additional management support is needed unless otherwise documented below in the visit note. 

## 2017-02-05 NOTE — Assessment & Plan Note (Signed)
Chronic. Diet controlled. Foot exam today. Update A1c. Actually latest A1c was in prediabetes range.

## 2017-02-05 NOTE — Assessment & Plan Note (Signed)
Chronic, stable. Continue current regimen. 

## 2017-02-05 NOTE — Assessment & Plan Note (Signed)
Chronic. Tolerating current regimen. Continue.

## 2017-02-05 NOTE — Assessment & Plan Note (Signed)
Encouraged weight loss through healthy lifestyle changes.

## 2017-02-05 NOTE — Assessment & Plan Note (Signed)
In h/o diverticulosis by colonoscopy - discussed symptoms of diverticulitis to monitor for. Exam overall benign today. Discussed bland diet if symptoms develop and need for evaluation if prolonged symptoms.  Check CBC ,CMP today.

## 2017-02-05 NOTE — Patient Instructions (Addendum)
Labs today. Continue current medicines.  If ongoing left abdominal pain longer than 2-3 days especially if any fever or bowel changes, you will need to be evaluated.  You are doing well today. Return as needed or in 6 months for follow up visit and medicare wellness.

## 2017-02-05 NOTE — Assessment & Plan Note (Signed)
Recurrent - planning on returning to hand surgery as will likely need definitive management.

## 2017-02-05 NOTE — Assessment & Plan Note (Signed)
Update Cr.  

## 2017-02-05 NOTE — Progress Notes (Addendum)
BP 132/76   Pulse 84   Temp 98.5 F (36.9 C) (Oral)   Wt 183 lb 8 oz (83.2 kg)   BMI 34.67 kg/m    CC: f/u visit Subjective:    Patient ID: Katherine Oliver, female    DOB: 08/27/1946, 71 y.o.   MRN: [id number]  HPI: Katherine Oliver is a 71 y.o. female presenting on 02/05/2017 for Follow-up   Recent bronchitis treated with zpack - slightly itchy skin with this but no rash. Treated with benadryl.   Some intermittent LLQ discomfort treated with tums and pepcid and gas X. Describes sharp pain. No fevers/chills, bowel changes, blood in stool. She did recently eat more tomatoes recently. This happened 3 wks ago and resolved after 5 days. Now again having ongoing discomfort 2 days this week, not currently.   HTN - Compliant with current antihypertensive regimen of amlodipine 10mg  daily, benazepril 20mg  daily, hctz 12.5mg  daily. Does check blood pressures at home: well controlled. No low blood pressure readings or symptoms of dizziness/syncope. Denies HA, vision changes, CP/tightness, SOB, leg swelling.    HLD - compliant with fibrate, fish oil, crestor 5mg  at bedtime without myalgias.   DM - regularly does not check sugars. Compliant with antihyperglycemic regimen which includes: diet controlled. Denies low sugars or hypoglycemic symptoms. Denies paresthesias. Last diabetic eye exam 06/2016.  Pneumovax: 2013, 2008.  Prevnar: 2015. Completed DSME at [facility]. Foot exam today.  Lab Results  Component Value Date   HGBA1C 6.2 07/31/2016   Diabetic Foot Exam - Simple   Simple Foot Form Diabetic Foot exam was performed with the following findings:  Yes 02/05/2017 12:49 PM  Visual Inspection No deformities, no ulcerations, no other skin breakdown bilaterally:  Yes Sensation Testing Intact to touch and monofilament testing bilaterally:  Yes Pulse Check Posterior Tibialis and Dorsalis pulse intact bilaterally:  Yes Comments      Relevant past medical, surgical, family and  social history reviewed and updated as indicated. Interim medical history since our last visit reviewed. Allergies and medications reviewed and updated. Outpatient Medications Prior to Visit  Medication Sig Dispense Refill  . amLODipine (NORVASC) 10 MG tablet Take 1 tablet by mouth  daily 90 tablet 3  . aspirin 81 MG tablet Take 81 mg by mouth daily.    . benazepril (LOTENSIN) 20 MG tablet Take 1 tablet by mouth  daily 90 tablet 3  . Calcium Carbonate-Vitamin D (CALCIUM 500/VITAMIN D PO) Take 2 tablets by mouth daily.    . diphenhydrAMINE (BENADRYL) 25 MG tablet Take 25 mg by mouth at bedtime as needed.    . fenofibrate 160 MG tablet Take 1 tablet by mouth  daily 90 tablet 3  . fish oil-omega-3 fatty acids 1000 MG capsule Take 3 g by mouth daily.     . fluticasone (FLONASE) 50 MCG/ACT nasal spray USE 2 SPRAYS INTO BOTH  NOSTRILS DAILY. AS NEEDED 48 g 1  . hydrochlorothiazide (MICROZIDE) 12.5 MG capsule Take 1 capsule (12.5 mg total) by mouth daily. 90 capsule 3  . Multiple Vitamin (MULTIVITAMIN) tablet Take 1 tablet by mouth daily.    . naproxen sodium (ANAPROX) 220 MG tablet Take 220 mg by mouth as needed.    . rosuvastatin (CRESTOR) 5 MG tablet Take 1 tablet by mouth at   bedtime 90 tablet 3  . triamcinolone ointment (KENALOG) 0.1 % APPLY ON THE SKIN AT BEDTIME TO AFFECTED AREAS OF HANDS. AVOID FACE, GROIN, AND UNDERARMS  1  . azithromycin (ZITHROMAX)  250 MG tablet Take two tablets on day one followed by one tablet on days 2-5 6 each 0  . guaiFENesin-codeine (CHERATUSSIN AC) 100-10 MG/5ML syrup Take 5 mLs by mouth 2 (two) times daily as needed for cough (sedation precautions). 120 mL 0   No facility-administered medications prior to visit.      Per HPI unless specifically indicated in ROS section below Review of Systems     Objective:    BP 132/76   Pulse 84   Temp 98.5 F (36.9 C) (Oral)   Wt 183 lb 8 oz (83.2 kg)   BMI 34.67 kg/m   Wt Readings from Last 3 Encounters:    02/05/17 183 lb 8 oz (83.2 kg)  11/04/16 183 lb (83 kg)  08/07/16 181 lb 8 oz (82.3 kg)    Physical Exam  Constitutional: She appears well-developed and well-nourished. No distress.  HENT:  Head: Normocephalic and atraumatic.  Right Ear: External ear normal.  Left Ear: External ear normal.  Nose: Nose normal.  Mouth/Throat: Oropharynx is clear and moist. No oropharyngeal exudate.  Eyes: Conjunctivae and EOM are normal. Pupils are equal, round, and reactive to light. No scleral icterus.  Neck: Normal range of motion. Neck supple.  Cardiovascular: Normal rate, regular rhythm, normal heart sounds and intact distal pulses.   No murmur heard. Murmur not appreciated today.   Pulmonary/Chest: Effort normal and breath sounds normal. No respiratory distress. She has no wheezes. She has no rales.  Abdominal: Soft. Bowel sounds are normal. She exhibits no distension and no mass. There is tenderness (mild) in the left lower quadrant. There is no rigidity, no rebound, no guarding, no CVA tenderness and negative Murphy's sign.  Musculoskeletal: She exhibits no edema.  See HPI for foot exam if done  Lymphadenopathy:    She has no cervical adenopathy.  Skin: Skin is warm and dry. No rash noted.  Psychiatric: She has a normal mood and affect.  Nursing note and vitals reviewed.  Results for orders placed or performed in visit on 08/19/16  HM MAMMOGRAPHY  Result Value Ref Range   HM Mammogram 0-4 Bi-Rad 0-4 Bi-Rad, Self Reported Normal   Lab Results  Component Value Date   CHOL 158 07/31/2016   HDL 44.30 07/31/2016   LDLCALC 70 07/02/2015   LDLDIRECT 88.0 07/31/2016   TRIG 249.0 (H) 07/31/2016   CHOLHDL 4 07/31/2016       Assessment & Plan:   Problem List Items Addressed This Visit    CKD stage 3 due to type 2 diabetes mellitus ([place])    Update Cr.       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 Diet controlled. Foot exam today. Update  A1c. Actually latest A1c was in prediabetes range.       Relevant Orders   Hemoglobin A1c   HLD (hyperlipidemia)    Chronic. Tolerating current regimen. Continue.       HTN (hypertension)    Chronic, stable. Continue current regimen.       LLQ abdominal pain    In h/o diverticulosis by colonoscopy - discussed symptoms of diverticulitis to monitor for. Exam overall benign today. Discussed bland diet if symptoms develop and need for evaluation if prolonged symptoms.  Check CBC ,CMP today.      Relevant Orders   Comprehensive metabolic panel   CBC with Differential/Platelet   NAFLD (nonalcoholic fatty liver disease)   Obesity, Class I, BMI 30-34.9  Encouraged weight loss through healthy lifestyle changes.      Trigger ring finger of right hand    Recurrent - planning on returning to hand surgery as will likely need definitive management.           Follow up plan: Return in about 6 months (around 08/07/2017) for medicare wellness visit, follow up visit.  [name], MD

## 2017-02-17 DIAGNOSIS — M65341 Trigger finger, right ring finger: Secondary | ICD-10-CM | POA: Diagnosis not present

## 2017-02-25 DIAGNOSIS — M65341 Trigger finger, right ring finger: Secondary | ICD-10-CM | POA: Diagnosis not present

## 2017-03-05 DIAGNOSIS — M65341 Trigger finger, right ring finger: Secondary | ICD-10-CM | POA: Diagnosis not present

## 2017-03-12 DIAGNOSIS — M65341 Trigger finger, right ring finger: Secondary | ICD-10-CM | POA: Diagnosis not present

## 2017-04-21 DIAGNOSIS — M65341 Trigger finger, right ring finger: Secondary | ICD-10-CM | POA: Diagnosis not present

## 2017-04-27 ENCOUNTER — Other Ambulatory Visit: Payer: Self-pay | Admitting: Family Medicine

## 2017-06-11 ENCOUNTER — Other Ambulatory Visit: Payer: Self-pay | Admitting: Family Medicine

## 2017-06-19 DIAGNOSIS — H2513 Age-related nuclear cataract, bilateral: Secondary | ICD-10-CM | POA: Diagnosis not present

## 2017-06-19 DIAGNOSIS — H04123 Dry eye syndrome of bilateral lacrimal glands: Secondary | ICD-10-CM | POA: Diagnosis not present

## 2017-06-19 DIAGNOSIS — I10 Essential (primary) hypertension: Secondary | ICD-10-CM | POA: Diagnosis not present

## 2017-06-19 DIAGNOSIS — H35033 Hypertensive retinopathy, bilateral: Secondary | ICD-10-CM | POA: Diagnosis not present

## 2017-06-19 DIAGNOSIS — E119 Type 2 diabetes mellitus without complications: Secondary | ICD-10-CM | POA: Diagnosis not present

## 2017-06-19 LAB — HM DIABETES EYE EXAM

## 2017-06-27 ENCOUNTER — Other Ambulatory Visit: Payer: Self-pay | Admitting: Family Medicine

## 2017-07-13 ENCOUNTER — Encounter: Payer: Self-pay | Admitting: Family Medicine

## 2017-07-21 ENCOUNTER — Other Ambulatory Visit: Payer: Self-pay | Admitting: Family Medicine

## 2017-07-21 DIAGNOSIS — Z1231 Encounter for screening mammogram for malignant neoplasm of breast: Secondary | ICD-10-CM

## 2017-08-10 ENCOUNTER — Other Ambulatory Visit: Payer: Self-pay | Admitting: Family Medicine

## 2017-08-10 ENCOUNTER — Ambulatory Visit (INDEPENDENT_AMBULATORY_CARE_PROVIDER_SITE_OTHER): Payer: Medicare Other

## 2017-08-10 VITALS — BP 112/72 | HR 83 | Temp 97.8°F | Ht 60.75 in | Wt 183.8 lb

## 2017-08-10 DIAGNOSIS — N183 Chronic kidney disease, stage 3 unspecified: Secondary | ICD-10-CM

## 2017-08-10 DIAGNOSIS — E1122 Type 2 diabetes mellitus with diabetic chronic kidney disease: Secondary | ICD-10-CM | POA: Diagnosis not present

## 2017-08-10 DIAGNOSIS — Z23 Encounter for immunization: Secondary | ICD-10-CM | POA: Diagnosis not present

## 2017-08-10 DIAGNOSIS — K760 Fatty (change of) liver, not elsewhere classified: Secondary | ICD-10-CM

## 2017-08-10 DIAGNOSIS — E1121 Type 2 diabetes mellitus with diabetic nephropathy: Secondary | ICD-10-CM | POA: Diagnosis not present

## 2017-08-10 DIAGNOSIS — E782 Mixed hyperlipidemia: Secondary | ICD-10-CM | POA: Diagnosis not present

## 2017-08-10 DIAGNOSIS — Z0000 Encounter for general adult medical examination without abnormal findings: Secondary | ICD-10-CM | POA: Diagnosis not present

## 2017-08-10 LAB — COMPREHENSIVE METABOLIC PANEL
ALBUMIN: 4.7 g/dL (ref 3.5–5.2)
ALK PHOS: 50 U/L (ref 39–117)
ALT: 58 U/L — AB (ref 0–35)
AST: 35 U/L (ref 0–37)
BILIRUBIN TOTAL: 0.5 mg/dL (ref 0.2–1.2)
BUN: 24 mg/dL — AB (ref 6–23)
CALCIUM: 10.8 mg/dL — AB (ref 8.4–10.5)
CHLORIDE: 101 meq/L (ref 96–112)
CO2: 28 mEq/L (ref 19–32)
CREATININE: 1.2 mg/dL (ref 0.40–1.20)
GFR: 46.8 mL/min — ABNORMAL LOW (ref >60.00)
Glucose, Bld: 148 mg/dL — ABNORMAL HIGH (ref 70–99)
Potassium: 4.8 mEq/L (ref 3.5–5.1)
SODIUM: 139 meq/L (ref 135–145)
TOTAL PROTEIN: 7.5 g/dL (ref 6.0–8.3)

## 2017-08-10 LAB — LDL CHOLESTEROL, DIRECT: LDL DIRECT: 101 mg/dL

## 2017-08-10 LAB — LIPID PANEL
CHOLESTEROL: 171 mg/dL (ref 0–200)
HDL: 43.8 mg/dL (ref >39.00)
NonHDL: 127.04
Total CHOL/HDL Ratio: 4
Triglycerides: 233 mg/dL — ABNORMAL HIGH (ref 0.0–149.0)
VLDL: 46.6 mg/dL — AB (ref 0.0–40.0)

## 2017-08-10 LAB — VITAMIN D 25 HYDROXY (VIT D DEFICIENCY, FRACTURES): VITD: 32.13 ng/mL (ref 30.00–100.00)

## 2017-08-10 LAB — HEMOGLOBIN A1C: Hgb A1c MFr Bld: 7.1 % — ABNORMAL HIGH (ref 4.6–6.5)

## 2017-08-10 NOTE — Progress Notes (Signed)
Pre visit review using our clinic review tool, if applicable. No additional management support is needed unless otherwise documented below in the visit note. 

## 2017-08-10 NOTE — Patient Instructions (Signed)
Katherine Oliver , Thank you for taking time to come for your Medicare Wellness Visit. I appreciate your ongoing commitment to your health goals. Please review the following plan we discussed and let me know if I can assist you in the future.   These are the goals we discussed: Goals    . Increase physical activity          Starting 08/10/2017, I will continue to exercise for at least 30 min twice weekly.        This is a list of the screening recommended for you and due dates:  Health Maintenance  Topic Date Due  . DTaP/Tdap/Td vaccine (1 - Tdap) 08/11/2027*  . Mammogram  08/19/2017  . Colon Cancer Screening  08/31/2017  . Complete foot exam   02/05/2018  . Hemoglobin A1C  02/08/2018  . Eye exam for diabetics  06/19/2018  . Tetanus Vaccine  08/11/2027  . Flu Shot  Completed  . DEXA scan (bone density measurement)  Completed  .  Hepatitis C: One time screening is recommended by Center for Disease Control  (CDC) for  adults born from [birth year] through 1967.   Completed  . Pneumonia vaccines  Completed  *Topic was postponed. The date shown is not the original due date.   Preventive Care for Adults  A healthy lifestyle and preventive care can promote health and wellness. Preventive health guidelines for adults include the following key practices.  . A routine yearly physical is a good way to check with your health care provider about your health and preventive screening. It is a chance to share any concerns and updates on your health and to receive a thorough exam.  . Visit your dentist for a routine exam and preventive care every 6 months. Brush your teeth twice a day and floss once a day. Good oral hygiene prevents tooth decay and gum disease.  . The frequency of eye exams is based on your age, health, family medical history, use  of contact lenses, and other factors. Follow your health care provider's ecommendations for frequency of eye exams.  . Eat a healthy diet. Foods like  vegetables, fruits, whole grains, low-fat dairy products, and lean protein foods contain the nutrients you need without too many calories. Decrease your intake of foods high in solid fats, added sugars, and salt. Eat the right amount of calories for you. Get information about a proper diet from your health care provider, if necessary.  . Regular physical exercise is one of the most important things you can do for your health. Most adults should get at least 150 minutes of moderate-intensity exercise (any activity that increases your heart rate and causes you to sweat) each week. In addition, most adults need muscle-strengthening exercises on 2 or more days a week.  Silver Sneakers may be a benefit available to you. To determine eligibility, you may visit the website: www.silversneakers.com or contact program at [phone] Mon-Fri between 8AM-8PM.   . Maintain a healthy weight. The body mass index (BMI) is a screening tool to identify possible weight problems. It provides an estimate of body fat based on height and weight. Your health care provider can find your BMI and can help you achieve or maintain a healthy weight.   For adults 20 years and older: ? A BMI below 18.5 is considered underweight. ? A BMI of 18.5 to 24.9 is normal. ? A BMI of 25 to 29.9 is considered overweight. ? A BMI of 30 and above is  considered obese.   . Maintain normal blood lipids and cholesterol levels by exercising and minimizing your intake of saturated fat. Eat a balanced diet with plenty of fruit and vegetables. Blood tests for lipids and cholesterol should begin at age 60 and be repeated every 5 years. If your lipid or cholesterol levels are high, you are over 50, or you are at high risk for heart disease, you may need your cholesterol levels checked more frequently. Ongoing high lipid and cholesterol levels should be treated with medicines if diet and exercise are not working.  . If you smoke, find out from your  health care provider how to quit. If you do not use tobacco, please do not start.  . If you choose to drink alcohol, please do not consume more than 2 drinks per day. One drink is considered to be 12 ounces (355 mL) of beer, 5 ounces (148 mL) of wine, or 1.5 ounces (44 mL) of liquor.  . If you are 2-68 years old, ask your health care provider if you should take aspirin to prevent strokes.  . Use sunscreen. Apply sunscreen liberally and repeatedly throughout the day. You should seek shade when your shadow is shorter than you. Protect yourself by wearing long sleeves, pants, a wide-brimmed hat, and sunglasses year round, whenever you are outdoors.  . Once a month, do a whole body skin exam, using a mirror to look at the skin on your back. Tell your health care provider of new moles, moles that have irregular borders, moles that are larger than a pencil eraser, or moles that have changed in shape or color.

## 2017-08-11 NOTE — Progress Notes (Signed)
Subjective:   Katherine Oliver is a 71 y.o. female who presents for Medicare Annual (Subsequent) preventive examination.  Review of Systems:  N/A Cardiac Risk Factors include: advanced age (>55men, >81 women);obesity (BMI >30kg/m2);dyslipidemia;hypertension     Objective:     Vitals: BP 112/72 (BP Location: Left Arm, Patient Position: Sitting, Cuff Size: Normal)   Pulse 83   Temp 97.8 F (36.6 C) (Oral)   Ht 5' 0.75" (1.543 m) Comment: no shoes  Wt 183 lb 12 oz (83.3 kg)   SpO2 92%   BMI 35.01 kg/m   Body mass index is 35.01 kg/m.   Tobacco History  Smoking Status  . Never Smoker  Smokeless Tobacco  . Never Used     Counseling given: No   Past Medical History:  Diagnosis Date  . Allergic rhinitis   . Arthritis   . 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 nephropathy ([place])    DSME at [facility] 01/2016   . GERD (gastroesophageal reflux disease)   . Hepatic steatosis    by abd [place] 05/2012, mild transaminitis - normal iron sat and viral hep panel (2011), stable [place] 2017  . History of chicken pox   . History of measles   . History of recurrent UTIs    on chronic keflex  . HLD (hyperlipidemia)   . HTN (hypertension)   . 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 Kidney cyst, acquired 01/2016   L kidney by [place]  . Kidney stone 01/2016   L kidney by [place]  . Lung nodules 11/2013   overall stable on f/u CT 01/2016  . Osteopenia 06/2013   mild, forearm T -1.1, hip and spine WNL  . Polycythemia    mild, stable (2013)  . Rosacea    metrogel   Past Surgical History:  Procedure Laterality Date  . APPENDECTOMY  1987  . BREAST BIOPSY Right 1963   benign  . BREAST BIOPSY Right 08/2014   benign- core  . cardiolite stress test  04/2004   normal  . CESAREAN SECTION  3762;8315   x2  . CHOLECYSTECTOMY  2003  .  COLONOSCOPY  09/26/2008   adenomatous polyp, rpt 3-5 yrs  . COLONOSCOPY  08/2012   adenomatous polyps, diverticulosis, rec rpt 5 yrs [name])  . dexa  2003   normal  . dexa  06/2013   [facility] - Tscore -1.1 forearm, normal spine and femur  . TRIGGER FINGER RELEASE  2007;2010;2011   bilateral  . TRIGGER FINGER RELEASE  02/2017  . VAGINAL HYSTERECTOMY  1984   for menorrhagia, ovaries in place   Family History  Problem Relation Age of Onset  . Stroke Mother        several  . Hyperlipidemia Mother   . Hypertension Mother   . Cancer Father        colon  . Hypertension Father   . Hyperlipidemia Father   . Coronary artery disease Father 29       MIx1, CABG  . Cancer Paternal Aunt        abdominal  . Coronary artery disease Maternal Grandmother   . Diabetes Maternal Grandfather   . Coronary artery disease Maternal Grandfather   . Breast cancer Neg Hx    History  Sexual Activity  . Sexual activity: Yes  Outpatient Encounter Prescriptions as of 08/10/2017  Medication Sig  . amLODipine (NORVASC) 10 MG tablet TAKE 1 TABLET BY MOUTH  DAILY  . aspirin 81 MG tablet Take 81 mg by mouth daily.  . benazepril (LOTENSIN) 20 MG tablet TAKE 1 TABLET BY MOUTH  DAILY  . Calcium Carbonate-Vitamin D (CALCIUM 500/VITAMIN D PO) Take 2 tablets by mouth daily.  . diphenhydrAMINE (BENADRYL) 25 MG tablet Take 25 mg by mouth at bedtime as needed.  . fenofibrate 160 MG tablet TAKE 1 TABLET BY MOUTH  DAILY  . fish oil-omega-3 fatty acids 1000 MG capsule Take 3 g by mouth daily.   . fluticasone (FLONASE) 50 MCG/ACT nasal spray USE 2 SPRAYS INTO BOTH  NOSTRILS DAILY. AS NEEDED  . hydrochlorothiazide (MICROZIDE) 12.5 MG capsule TAKE 1 CAPSULE BY MOUTH  DAILY  . Multiple Vitamin (MULTIVITAMIN) tablet Take 1 tablet by mouth daily.  . rosuvastatin (CRESTOR) 5 MG tablet TAKE 1 TABLET BY MOUTH AT  BEDTIME  . naproxen sodium (ANAPROX) 220 MG tablet Take 220 mg by mouth as needed.  . triamcinolone ointment  (KENALOG) 0.1 % APPLY ON THE SKIN AT BEDTIME TO AFFECTED AREAS OF HANDS. AVOID FACE, GROIN, AND UNDERARMS   No facility-administered encounter medications on file as of 08/10/2017.     Activities of Daily Living In your present state of health, do you have any difficulty performing the following activities: 08/10/2017  Hearing? N  Vision? N  Difficulty concentrating or making decisions? N  Walking or climbing stairs? Y  Dressing or bathing? N  Doing errands, shopping? N  Preparing Food and eating ? N  Using the Toilet? N  In the past six months, have you accidently leaked urine? N  Do you have problems with loss of bowel control? N  Managing your Medications? N  Managing your Finances? N  Housekeeping or managing your Housekeeping? N  Some recent data might be hidden    Patient Care Team: [name], MD as PCP - General (Family Medicine) [name], MD as Consulting Physician (Orthopedic Surgery) Ralene Bathe, MD as Consulting Physician (Dermatology) Johnnette Litter, MD as Consulting Physician (Dentistry) Thelma Comp, OD as Consulting Physician (Optometry)    Assessment:     Hearing Screening   125Hz  250Hz  500Hz  1000Hz  2000Hz  3000Hz  4000Hz  6000Hz  8000Hz   Right ear:   40 40 40  0    Left ear:   40 40 40  40    Vision Screening Comments: Last vision exam in Sept 2018 with Dr. Maryruth Hancock B.    Exercise Activities and Dietary recommendations Current Exercise Habits: Home exercise routine, Type of exercise: walking, Time (Minutes): 30, Frequency (Times/Week): 1, Weekly Exercise (Minutes/Week): 30, Intensity: Mild, Exercise limited by: None identified  Goals    . Increase physical activity          Starting 08/10/2017, I will continue to exercise for at least 30 min twice weekly.       Fall Risk Fall Risk  08/10/2017 07/14/2016 07/05/2015 06/30/2014 06/02/2013  Falls in the past year? No No No No No   Depression Screen PHQ 2/9 Scores 08/10/2017 07/14/2016 07/05/2015  06/30/2014  PHQ - 2 Score 0 0 0 0  PHQ- 9 Score 0 - - -     Cognitive Function MMSE - Mini Mental State Exam 08/10/2017 07/14/2016  Orientation to time 5 5  Orientation to Place 5 5  Registration 3 3  Attention/ Calculation 0 0  Recall 3 3  Language- name 2  objects 0 0  Language- repeat 1 1  Language- follow 3 step command 3 3  Language- read & follow direction 0 0  Write a sentence 0 0  Copy design 0 0  Total score 20 20       PLEASE NOTE: A Mini-Cog screen was completed. Maximum score is 20. A value of 0 denotes this part of Folstein MMSE was not completed or the patient failed this part of the Mini-Cog screening.   Mini-Cog Screening Orientation to Time - Max 5 pts Orientation to Place - Max 5 pts Registration - Max 3 pts Recall - Max 3 pts Language Repeat - Max 1 pts Language Follow 3 Step Command - Max 3 pts   Immunization History  Administered Date(s) Administered  . Influenza, High Dose Seasonal PF 08/10/2017  . Influenza,inj,Quad PF,6+ Mos 08/02/2014, 07/05/2015, 07/14/2016  . Influenza-Unspecified 08/03/2013  . Pneumococcal Conjugate-13 06/30/2014  . Pneumococcal Polysaccharide-23 11/03/2002, 09/20/2007, 05/31/2012  . Td 01/20/2005, 08/10/2017  . Zoster 11/03/2008   Screening Tests Health Maintenance  Topic Date Due  . DTaP/Tdap/Td (1 - Tdap) 08/11/2027 (Originally 08/11/2017)  . MAMMOGRAM  08/19/2017  . COLONOSCOPY  08/31/2017  . FOOT EXAM  02/05/2018  . HEMOGLOBIN A1C  02/08/2018  . OPHTHALMOLOGY EXAM  06/19/2018  . TETANUS/TDAP  08/11/2027  . INFLUENZA VACCINE  Completed  . DEXA SCAN  Completed  . Hepatitis C Screening  Completed  . PNA vac Low Risk Adult  Completed      Plan:     I have personally reviewed and addressed the Medicare Annual Wellness questionnaire and have noted the following in the patient's chart:  A. Medical and social history B. Use of alcohol, tobacco or illicit drugs  C. Current medications and supplements D. Functional  ability and status E.  Nutritional status F.  Physical activity G. Advance directives H. List of other physicians I.  Hospitalizations, surgeries, and ER visits in previous 12 months J.  [place] to include hearing, vision, cognitive, depression L. Referrals and appointments - none  In addition, I have reviewed and discussed with patient certain preventive protocols, quality metrics, and best practice recommendations. A written personalized care plan for preventive services as well as general preventive health recommendations were provided to patient.  See attached scanned questionnaire for additional information.   Signed,   Lindell Noe, MHA, BS, LPN Health Coach

## 2017-08-11 NOTE — Progress Notes (Signed)
PCP notes:   Health maintenance:  Flu vaccine - administered Tetanus vaccine - administered  Abnormal screenings:   Hearing -failed  Hearing Screening   125Hz  250Hz  500Hz  1000Hz  2000Hz  3000Hz  4000Hz  6000Hz  8000Hz   Right ear:   40 40 40  0    Left ear:   40 40 40  40     Patient concerns:   None  Nurse concerns:  None  Next PCP appt:   10/16 @ 1200

## 2017-08-15 ENCOUNTER — Other Ambulatory Visit: Payer: Self-pay | Admitting: Family Medicine

## 2017-08-16 NOTE — Progress Notes (Signed)
I reviewed health advisor's note, was available for consultation, and agree with documentation and plan.  

## 2017-08-18 ENCOUNTER — Encounter: Payer: Self-pay | Admitting: Family Medicine

## 2017-08-18 ENCOUNTER — Ambulatory Visit (INDEPENDENT_AMBULATORY_CARE_PROVIDER_SITE_OTHER): Payer: Medicare Other | Admitting: Family Medicine

## 2017-08-18 DIAGNOSIS — M8580 Other specified disorders of bone density and structure, unspecified site: Secondary | ICD-10-CM | POA: Diagnosis not present

## 2017-08-18 DIAGNOSIS — Z6379 Other stressful life events affecting family and household: Secondary | ICD-10-CM | POA: Insufficient documentation

## 2017-08-18 DIAGNOSIS — N183 Chronic kidney disease, stage 3 unspecified: Secondary | ICD-10-CM

## 2017-08-18 DIAGNOSIS — N62 Hypertrophy of breast: Secondary | ICD-10-CM

## 2017-08-18 DIAGNOSIS — E782 Mixed hyperlipidemia: Secondary | ICD-10-CM | POA: Diagnosis not present

## 2017-08-18 DIAGNOSIS — I10 Essential (primary) hypertension: Secondary | ICD-10-CM | POA: Diagnosis not present

## 2017-08-18 DIAGNOSIS — E669 Obesity, unspecified: Secondary | ICD-10-CM

## 2017-08-18 DIAGNOSIS — E1121 Type 2 diabetes mellitus with diabetic nephropathy: Secondary | ICD-10-CM

## 2017-08-18 DIAGNOSIS — E1122 Type 2 diabetes mellitus with diabetic chronic kidney disease: Secondary | ICD-10-CM

## 2017-08-18 DIAGNOSIS — K760 Fatty (change of) liver, not elsewhere classified: Secondary | ICD-10-CM | POA: Diagnosis not present

## 2017-08-18 DIAGNOSIS — Z7189 Other specified counseling: Secondary | ICD-10-CM | POA: Diagnosis not present

## 2017-08-18 DIAGNOSIS — N6489 Other specified disorders of breast: Secondary | ICD-10-CM

## 2017-08-18 DIAGNOSIS — J309 Allergic rhinitis, unspecified: Secondary | ICD-10-CM | POA: Diagnosis not present

## 2017-08-18 MED ORDER — AMLODIPINE BESYLATE 10 MG PO TABS: 10.0000 mg | ORAL_TABLET | Freq: Every day | ORAL | 3 refills | Status: DC

## 2017-08-18 MED ORDER — HYDROCHLOROTHIAZIDE 12.5 MG PO CAPS: 12.5000 mg | ORAL_CAPSULE | Freq: Every day | ORAL | 3 refills | Status: DC

## 2017-08-18 MED ORDER — ROSUVASTATIN CALCIUM 5 MG PO TABS: 5.0000 mg | ORAL_TABLET | Freq: Every day | ORAL | 3 refills | Status: DC

## 2017-08-18 MED ORDER — FLUTICASONE PROPIONATE 50 MCG/ACT NA SUSP: NASAL | 3 refills | Status: DC

## 2017-08-18 MED ORDER — BENAZEPRIL HCL 20 MG PO TABS: 20.0000 mg | ORAL_TABLET | Freq: Every day | ORAL | 3 refills | Status: DC

## 2017-08-18 MED ORDER — FENOFIBRATE 160 MG PO TABS: 160.0000 mg | ORAL_TABLET | Freq: Every day | ORAL | 3 refills | Status: DC

## 2017-08-18 NOTE — Assessment & Plan Note (Addendum)
H/o this. Pending upcoming mammogram next week

## 2017-08-18 NOTE — Progress Notes (Addendum)
BP 130/68 (BP Location: Left Arm, Patient Position: Sitting, Cuff Size: Large)   Pulse 88   Temp 97.9 F (36.6 C) (Oral)   Ht 5\' 1"  (1.549 m)   Wt 180 lb (81.6 kg)   SpO2 94%   BMI 34.01 kg/m    CC: medicare wellness visit f/u Subjective:    Patient ID: Katherine Oliver, female    DOB: 1945-12-20, 71 y.o.   MRN: [id number]  HPI: [name] is a 71 y.o. female presenting on 08/18/2017 for Annual Exam (Pt 2)   Saw Katha Cabal last week for medicare wellness visit. Note reviewed.   She has been using krill oil in place of omega 3 fatty acids.  Increased stress recently - husband's illness, recent travel, family illnesses.  Allergic rhinitis - claritin and allegra ineffective. Zyrtec has caused some withdrawal symptoms. She takes benadryl for this.   ?milk intolerance - diarrhea or Gi upset with milk and cereal in the mornings. Avoids dairy except for cheese   Preventative: Colon 08/2012 - adenomatous polyps, diverticulosis, rec rpt 5 yrs [name]). Father with colon cancer age 65s. Appt scheduled.  Mammogram - 08/2014 abnormal - s/p normal core biopsy and f/u US 4/21016. mammo 08/2015 and 08/2016 WNL, next week has f/u scheduled.  Pap smear - stopped around 71yo, s/p hysterectomy for heavy bleeding. Both ovaries remain. Declines continued pelvic exam, discussed changes to monitor for.  Dexa Date: 06/2013 [facility] - Tscore -1.1 forearm, normal spine and femur Flu shot yearly Pneumovax 05/2012, prevnar 2015 Td 2006  Zostavax 2010  Shingrix - discussed, declines Advanced directives: husband [name] is [place]. DPOA in chart (05/2014).  Seat belt use discussed  Sunscreen use discussed. No changing moles on skin.  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 Has  backed off walking about 20 min.  Diet: ok water intake 4 glasses/day, daily fruits/vegetables, red meat 4x/wk, fish 3-4x/wk   Relevant past medical, surgical, family and social history reviewed and updated as indicated. Interim medical history since our last visit reviewed. Allergies and medications reviewed and updated. Outpatient Medications Prior to Visit  Medication Sig Dispense Refill  . aspirin 81 MG tablet Take 81 mg by mouth daily.    . Calcium Carbonate-Vitamin D (CALCIUM 500/VITAMIN D PO) Take 2 tablets by mouth daily.    . diphenhydrAMINE (BENADRYL) 25 MG tablet Take 25 mg by mouth at bedtime as needed.    . fish oil-omega-3 fatty acids 1000 MG capsule Take 3 g by mouth daily.     . Multiple Vitamin (MULTIVITAMIN) tablet Take 1 tablet by mouth daily.    . naproxen sodium (ANAPROX) 220 MG tablet Take 220 mg by mouth as needed.    Marland Kitchen amLODipine (NORVASC) 10 MG tablet TAKE 1 TABLET BY MOUTH  DAILY 90 tablet 1  . benazepril (LOTENSIN) 20 MG tablet TAKE 1 TABLET BY MOUTH  DAILY 90 tablet 3  . fenofibrate 160 MG tablet TAKE 1 TABLET BY MOUTH  DAILY 90 tablet 1  . fluticasone (FLONASE) 50 MCG/ACT nasal spray USE 2 SPRAYS INTO BOTH  NOSTRILS DAILY. AS NEEDED 48 g 1  . hydrochlorothiazide (MICROZIDE) 12.5 MG capsule TAKE 1 CAPSULE BY MOUTH  DAILY 90 capsule 0  . rosuvastatin (CRESTOR) 5 MG tablet TAKE 1 TABLET BY MOUTH  AT  BEDTIME 90 tablet 0  . triamcinolone ointment (KENALOG) 0.1 % APPLY ON THE SKIN AT BEDTIME TO AFFECTED AREAS OF HANDS. AVOID FACE, GROIN, AND UNDERARMS  1   No facility-administered medications prior to visit.      Per HPI unless specifically indicated in ROS section below Review of Systems     Objective:    BP 130/68 (BP Location: Left Arm, Patient Position: Sitting, Cuff Size: Large)   Pulse 88   Temp 97.9 F (36.6 C) (Oral)   Ht 5\' 1"  (1.549 m)   Wt 180 lb (81.6 kg)   SpO2 94%   BMI 34.01 kg/m   Wt Readings from Last 3 Encounters:  08/18/17 180 lb (81.6  kg)  08/10/17 183 lb 12 oz (83.3 kg)  02/05/17 183 lb 8 oz (83.2 kg)    Physical Exam  Constitutional: She is oriented to person, place, and time. She appears well-developed and well-nourished. No distress.  HENT:  Head: Normocephalic and atraumatic.  Right Ear: Hearing, tympanic membrane, external ear and ear canal normal.  Left Ear: Hearing, tympanic membrane, external ear and ear canal normal.  Nose: Nose normal.  Mouth/Throat: Uvula is midline, oropharynx is clear and moist and mucous membranes are normal. No oropharyngeal exudate, posterior oropharyngeal edema or posterior oropharyngeal erythema.  Eyes: Pupils are equal, round, and reactive to light. Conjunctivae and EOM are normal. No scleral icterus.  Neck: Normal range of motion. Neck supple.  Cardiovascular: Normal rate, regular rhythm, normal heart sounds and intact distal pulses.   No murmur heard. Pulses:      Radial pulses are 2+ on the right side, and 2+ on the left side.  Pulmonary/Chest: Effort normal and breath sounds normal. No respiratory distress. She has no wheezes. She has no rales.  Abdominal: Soft. Bowel sounds are normal. She exhibits no distension and no mass. There is no tenderness. There is no rebound and no guarding.  Musculoskeletal: Normal range of motion. She exhibits no edema.  Lymphadenopathy:    She has no cervical adenopathy.  Neurological: She is alert and oriented to person, place, and time.  CN grossly intact, station and gait intact  Skin: Skin is warm and dry. No rash noted.  Psychiatric: She has a normal mood and affect. Her behavior is normal. Judgment and thought content normal.  Nursing note and vitals reviewed.  Results for orders placed or performed in visit on 08/10/17  VITAMIN D 25 Hydroxy (Vit-D Deficiency, Fractures)  Result Value Ref Range   VITD 32.13 30.00 - 100.00 ng/mL  Lipid panel  Result Value Ref Range   Cholesterol 171 0 - 200 mg/dL   Triglycerides 233.0 (H) 0.0 - 149.0  mg/dL   HDL 43.80 >39.00 mg/dL   VLDL 46.6 (H) 0.0 - 40.0 mg/dL   Total CHOL/HDL Ratio 4    NonHDL 127.04   Comprehensive metabolic panel  Result Value Ref Range   Sodium 139 135 - 145 mEq/L   Potassium 4.8 3.5 - 5.1 mEq/L   Chloride 101 96 - 112 mEq/L   CO2 28 19 - 32 mEq/L   Glucose, Bld 148 (H) 70 - 99 mg/dL   BUN 24 (H) 6 - 23 mg/dL   Creatinine, Ser 1.20 0.40 - 1.20 mg/dL   Total Bilirubin 0.5 0.2 - 1.2 mg/dL   Alkaline Phosphatase 50 39 - 117 U/L   AST 35 0 - 37 U/L   ALT 58 (H) 0 - 35 U/L   Total Protein 7.5  6.0 - 8.3 g/dL   Albumin 4.7 3.5 - 5.2 g/dL   Calcium 10.8 (H) 8.4 - 10.5 mg/dL   GFR 46.80 (L) >60.00 mL/min  Hemoglobin A1c  Result Value Ref Range   Hgb A1c MFr Bld 7.1 (H) 4.6 - 6.5 %  LDL cholesterol, direct  Result Value Ref Range   Direct LDL 101.0 mg/dL   Lab Results  Component Value Date   WBC 6.8 02/05/2017   HGB 14.7 02/05/2017   HCT 44.9 02/05/2017   MCV 84.9 02/05/2017   PLT 233.0 02/05/2017       Assessment & Plan:   Problem List Items Addressed This Visit    Advanced care planning/counseling discussion    Advanced directives: husband [name] is [facility]. DPOA in chart (05/2014).       Allergic rhinitis    Continue PRN benadryl, flonase.       CKD stage 3 due to type 2 diabetes mellitus (HCC)    Chronic, stable. Reviewed with patient. Encouraged good hydration status.       Relevant Medications   benazepril (LOTENSIN) 20 MG tablet   rosuvastatin (CRESTOR) 5 MG tablet   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 nephropathy (HCC)    Chronic, diet controlled. A1c trending up. Reviewed healthy diet and lifestyle changes to maintain control. Recommended sooner f/u given A1c trend.       Relevant Medications   benazepril (LOTENSIN) 20 MG tablet   rosuvastatin (CRESTOR) 5 MG tablet   HLD (hyperlipidemia)    Chronic, stable on 3 drug regimen (fenofibrate 160mg , crestor 5mg , krill oil). LFTs stable. Continue. Triglycerides remain  high. Will work towards better glycemic control.       Relevant Medications   amLODipine (NORVASC) 10 MG tablet   benazepril (LOTENSIN) 20 MG tablet   fenofibrate 160 MG tablet   hydrochlorothiazide (MICROZIDE) 12.5 MG capsule   rosuvastatin (CRESTOR) 5 MG tablet   HTN (hypertension)    Chronic, stable. Continue current regimen. refilled today.       Relevant Medications   amLODipine (NORVASC) 10 MG tablet   benazepril (LOTENSIN) 20 MG tablet   fenofibrate 160 MG tablet   hydrochlorothiazide (MICROZIDE) 12.5 MG capsule   rosuvastatin (CRESTOR) 5 MG tablet   NAFLD (nonalcoholic fatty liver disease)    Chronic, stable. LFTs have stabilized. Continue to monitor.       Obesity, Class I, BMI 30-34.9    Discussed healthy diet and lifestyle changes to affect sustainable weight loss.       Osteopenia    Reviewed calcium, vit D intake, encouraged regular weight bearing exercises      Pseudoangiomatous stromal hyperplasia of breast    H/o this. Pending upcoming mammogram next week      Stress due to illness of family member    Husband recently dx follicular lymphoma, he is doing ok.  Reviewed importance of stress relieving strategies.  If no better, consider vistaril. Pt has h/o poorly tolerating prior anxiolytics.           Follow up plan: Return in about 4 months (around 12/19/2017), or if symptoms worsen or fail to improve, for follow up visit.  [name], MD

## 2017-08-18 NOTE — Assessment & Plan Note (Signed)
Reviewed calcium, vit D intake, encouraged regular weight bearing exercises

## 2017-08-18 NOTE — Assessment & Plan Note (Signed)
Chronic, stable. Continue current regimen. refilled today.

## 2017-08-18 NOTE — Assessment & Plan Note (Addendum)
Chronic, stable. LFTs have stabilized. Continue to monitor.

## 2017-08-18 NOTE — Assessment & Plan Note (Signed)
Advanced directives: husband [name] is [place]. DPOA in chart (05/2014).

## 2017-08-18 NOTE — Assessment & Plan Note (Signed)
Continue PRN benadryl, flonase.

## 2017-08-18 NOTE — Assessment & Plan Note (Addendum)
Chronic, diet controlled. A1c trending up. Reviewed healthy diet and lifestyle changes to maintain control. Recommended sooner f/u given A1c trend.

## 2017-08-18 NOTE — Assessment & Plan Note (Addendum)
Chronic, stable on 3 drug regimen (fenofibrate 160mg , crestor 5mg , krill oil). LFTs stable. Continue. Triglycerides remain high. Will work towards better glycemic control.

## 2017-08-18 NOTE — Assessment & Plan Note (Signed)
Discussed healthy diet and lifestyle changes to affect sustainable weight loss  

## 2017-08-18 NOTE — Assessment & Plan Note (Signed)
Chronic, stable. Reviewed with patient. Encouraged good hydration status.

## 2017-08-18 NOTE — Patient Instructions (Addendum)
Incorporate walking into the routine.  Return as needed or in 4 months for follow up visit on sugars. Work on low carb and low sugar diet to help keep diabetes under control.  Good to see you today, call us with questions.

## 2017-08-18 NOTE — Assessment & Plan Note (Signed)
Husband recently dx follicular lymphoma, he is doing ok.  Reviewed importance of stress relieving strategies.  If no better, consider vistaril. Pt has h/o poorly tolerating prior anxiolytics.

## 2017-08-25 ENCOUNTER — Ambulatory Visit
Admission: RE | Admit: 2017-08-25 | Discharge: 2017-08-25 | Disposition: A | Discharge status: Home / Self Care | Payer: Medicare Other | Source: Ambulatory Visit | Attending: Family Medicine | Admitting: Family Medicine

## 2017-08-25 DIAGNOSIS — Z1231 Encounter for screening mammogram for malignant neoplasm of breast: Secondary | ICD-10-CM | POA: Diagnosis not present

## 2017-08-25 LAB — HM MAMMOGRAPHY

## 2017-08-27 ENCOUNTER — Encounter: Payer: Self-pay | Admitting: Family Medicine

## 2017-12-07 ENCOUNTER — Encounter: Payer: Self-pay | Admitting: Family Medicine

## 2017-12-07 ENCOUNTER — Ambulatory Visit (INDEPENDENT_AMBULATORY_CARE_PROVIDER_SITE_OTHER): Payer: Medicare Other | Admitting: Family Medicine

## 2017-12-07 VITALS — BP 120/78 | HR 91 | Temp 97.9°F | Wt 182.0 lb

## 2017-12-07 DIAGNOSIS — R300 Dysuria: Secondary | ICD-10-CM | POA: Diagnosis not present

## 2017-12-07 DIAGNOSIS — L309 Dermatitis, unspecified: Secondary | ICD-10-CM

## 2017-12-07 DIAGNOSIS — R102 Pelvic and perineal pain: Secondary | ICD-10-CM | POA: Diagnosis not present

## 2017-12-07 LAB — POC URINALSYSI DIPSTICK (AUTOMATED)
Bilirubin, UA: NEGATIVE
Blood, UA: NEGATIVE
Glucose, UA: NEGATIVE
Ketones, UA: NEGATIVE
LEUKOCYTES UA: NEGATIVE
Nitrite, UA: NEGATIVE
PH UA: 6 (ref 5.0–8.0)
PROTEIN UA: NEGATIVE
Spec Grav, UA: 1.015 (ref 1.010–1.025)
UROBILINOGEN UA: 0.2 U/dL

## 2017-12-07 MED ORDER — BETAMETHASONE DIPROPIONATE 0.05 % EX OINT: TOPICAL_OINTMENT | Freq: Two times a day (BID) | CUTANEOUS | 0 refills | Status: DC

## 2017-12-07 NOTE — Progress Notes (Signed)
BP 120/78 (BP Location: Left Arm, Patient Position: Sitting, Cuff Size: Normal)   Pulse 91   Temp 97.9 F (36.6 C) (Oral)   Wt 182 lb (82.6 kg)   SpO2 94%   BMI 34.39 kg/m    CC: UTI? Subjective:    Patient ID: Katherine Oliver, female    DOB: 10/20/1946, 72 y.o.   MRN: [id number]  HPI: Katherine Oliver is a 72 y.o. female presenting on 12/07/2017 for Dysuria (C/o burning is constant and itching during urination. Had low-grade fever last night. Started about 1 mo ago. Use Bactine and Vaseline, sxs worsen. Also, tried vinegar/water douche and yogurt.) and Urinary Frequency   1 mo h/o vaginal/vulvar irritation, over the last week noticing increased urinary frequency. Also with vulvar irritation. No true dysuria, hematuria. No vaginal discharge. No significant abdominal discomfort. Some nausea last night. Last night felt feverish - this improved with tylenol.   Has been treating with vaseline and bactine lidocaine spray. Tried 3d miconazole OTC. Tried vinegar/water douche with yogurt.   She did have candidal intertrigo treated with lamisil with improvement.   S/p hysterectomy. Ovaries remain.   Relevant past medical, surgical, family and social history reviewed and updated as indicated. Interim medical history since our last visit reviewed. Allergies and medications reviewed and updated. Outpatient Medications Prior to Visit  Medication Sig Dispense Refill  . amLODipine (NORVASC) 10 MG tablet Take 1 tablet (10 mg total) by mouth daily. 90 tablet 3  . aspirin 81 MG tablet Take 81 mg by mouth daily.    . benazepril (LOTENSIN) 20 MG tablet Take 1 tablet (20 mg total) by mouth daily. 90 tablet 3  . Calcium Carbonate-Vitamin D (CALCIUM 500/VITAMIN D PO) Take 2 tablets by mouth daily.    . diphenhydrAMINE (BENADRYL) 25 MG tablet Take 25 mg by mouth at bedtime as needed.    . fenofibrate 160 MG tablet Take 1 tablet (160 mg total) by mouth daily. 90 tablet 3  . fish  oil-omega-3 fatty acids 1000 MG capsule Take 3 g by mouth daily.     . fluticasone (FLONASE) 50 MCG/ACT nasal spray USE 2 SPRAYS INTO BOTH  NOSTRILS DAILY AS NEEDED 48 g 3  . hydrochlorothiazide (MICROZIDE) 12.5 MG capsule Take 1 capsule (12.5 mg total) by mouth daily. 90 capsule 3  . Multiple Vitamin (MULTIVITAMIN) tablet Take 1 tablet by mouth daily.    . naproxen sodium (ANAPROX) 220 MG tablet Take 220 mg by mouth as needed.    . rosuvastatin (CRESTOR) 5 MG tablet Take 1 tablet (5 mg total) by mouth at bedtime. 90 tablet 3   No facility-administered medications prior to visit.      Per HPI unless specifically indicated in ROS section below Review of Systems     Objective:    BP 120/78 (BP Location: Left Arm, Patient Position: Sitting, Cuff Size: Normal)   Pulse 91   Temp 97.9 F (36.6 C) (Oral)   Wt 182 lb (82.6 kg)   SpO2 94%   BMI 34.39 kg/m   Wt Readings from Last 3 Encounters:  12/07/17 182 lb (82.6 kg)  08/18/17 180 lb (81.6 kg)  08/10/17 183 lb 12 oz (83.3 kg)    Physical Exam  Constitutional: She appears well-developed and well-nourished. No distress.  HENT:  Mouth/Throat: Oropharynx is clear and moist. No oropharyngeal exudate.  Cardiovascular: Normal rate, regular rhythm, normal heart sounds and intact distal pulses.  No murmur heard. Pulmonary/Chest: Effort normal and breath sounds  normal. No respiratory distress. She has no wheezes. She has no rales.  Abdominal: Soft. Normal appearance and bowel sounds are normal. She exhibits no distension and no mass. There is no hepatosplenomegaly. There is tenderness (mild) in the left lower quadrant. There is no rigidity, no rebound, no guarding, no CVA tenderness and negative Murphy's sign.  Genitourinary: There is rash and tenderness on the right labia. There is no injury on the right labia. There is rash and tenderness on the left labia. There is no injury on the left labia.  Genitourinary Comments: Raw erythema and edema  at introitus  Musculoskeletal: She exhibits no edema.  Skin: Skin is warm and dry. No rash noted.  Nursing note and vitals reviewed.  Results for orders placed or performed in visit on 12/07/17  POCT Urinalysis Dipstick (Automated)  Result Value Ref Range   Color, UA yellow    Clarity, UA clear    Glucose, UA negative    Bilirubin, UA negative    Ketones, UA negative    Spec Grav, UA 1.015 1.010 - 1.025   Blood, UA negative    pH, UA 6.0 5.0 - 8.0   Protein, UA negative    Urobilinogen, UA 0.2 0.2 or 1.0 E.U./dL   Nitrite, UA negative    Leukocytes, UA Negative Negative   Lab Results  Component Value Date   CREATININE 1.20 08/10/2017   BUN 24 (H) 08/10/2017   NA 139 08/10/2017   K 4.8 08/10/2017   CL 101 08/10/2017   CO2 28 08/10/2017       Assessment & Plan:   Problem List Items Addressed This Visit    Vulvar dermatitis - Primary    Anticipate steroid responsive dermatosis Continue vaseline, Rx betamethasone x 1 month and if no improvement discussed GYN referral. Pt agrees with plan.       Relevant Orders   WET PREP BY MOLECULAR PROBE    Other Visit Diagnoses    Dysuria       Relevant Orders   POCT Urinalysis Dipstick (Automated) (Completed)       Follow up plan: Return if symptoms worsen or fail to improve.  [name], MD

## 2017-12-07 NOTE — Patient Instructions (Signed)
I think you have vulvar dermatitis - treat with steroid cream sent to pharmacy twice daily for 1 week then nightly for 1 month. If not improving with this, let us know for referral to gynecologist. May continue vaseline as well.   Vulvar Pain Vulvar pain is long-lasting (chronic) pain in the external female genitalia (vulva). This condition may also be called vulvodynia. The vulva includes tissues surrounding the vaginal opening and the urethra. Vulvar pain often has no known cause. There are two main types of vulvar pain:  Localized vulvar pain. This is when pain affects a specific area of the vulva. ? Vulvar vestibulitis syndrome (VVS) is a type of localized vulvar pain in which pain is only felt around the opening (vestibule) of the vagina.  Generalized vulvar pain. This is when pain affects: ? The entire vulva. ? Multiple areas of the vulva. ? One side of the vulva.  What are the causes? The cause of vulvar pain is often not known, and many factors may contribute to it. It may be a type of nerve pain (neuropathic pain). What increases the risk? Women who have any of the following conditions may be more likely to develop vulvar pain:  Pelvic floor dysfunction.  A disorder that affects a protein (interleukin 1 beta receptor antagonist) involved in inflammation in the body.  An increased number of pain receptor nerves throughout the body.  Painful bladder.  Irritable bowel syndrome (IBS).  Fibromyalgia.  Restless sleep.  Depression.  Certain pain conditions.  What are the signs or symptoms? The main symptom of this condition is pain that affects part of the vulva or the entire vulva. Pain may:  Feel like a burning, tearing, stinging, or sharp, knife-like sensation.  Be chronic and ongoing.  Occur off and on (intermittently).  Get worse when touching or putting pressure on the vulva, such as with tampon insertion, sexual intercourse, sitting for a long time, or wearing  tight pants.  Symptoms may also include:  Swelling or redness of the vulva, perineum, or inner thighs.  Psychological or emotional distress.  How is this diagnosed? This condition may be diagnosed based on:  Your symptoms and your medical history. Your health care provider may ask questions about what causes or worsens your pain.  A physical exam of your abdomen and pelvis. This may include a cotton swab test, in which your health care provider gently touches all areas of your vulva with a cotton swab to determine where you have pain.  Tests of your vaginal fluid. These tests may be done to check for infection.  How is this treated? Treatment for this condition depends on the cause and severity of your pain. Treatment may include:  Caring for your skin.  Working with a health care provider who specializes in pain.  Physical therapy.  Counseling (psychotherapy). This may include: ? Biofeedback. This is a type of therapy that helps you be more aware of your body's response to pain. It also helps you learn to deal with pain.  Alternative medical therapies to help relieve pain, such as: ? Hypnotherapy. This means being put in a state of altered consciousness (hypnosis). ? Acupuncture. This is the insertion of needles into certain places on the skin.  Surgery to remove affected parts of your vulva.  Follow these instructions at home: Clothing  Wear cotton underwear.  Avoid wearing tight pants.  Wash clothing in non-scented, mild laundry detergent. Do not use fabric softeners or dryer sheets. Eating and drinking  Avoid caffeine.  Avoid foods that are high in sugar or highly processed. General instructions   Take over-the-counter and prescription medicines only as told by your health care provider.  If any soaps, lotions, creams, or ointments cause or worsen your pain, do not use these products on the affected areas.  Do not use feminine wipes or douches.  Do not use  scented body soaps, scented pads, or scented tampons.  Clean your vulvar skin with warm water only and pat the area dry.  Consider using a non-scented silicone-based or oil-based lubricant during sexual intercourse.  Keep all follow-up visits as told by your health care provider. This is important. Contact a health care provider if:  Your pain gets worse or does not improve after starting treatment.  You develop vaginal discharge that smells bad. This information is not intended to replace advice given to you by your health care provider. Make sure you discuss any questions you have with your health care provider. Document Released: 02/11/2016 Document Revised: 03/27/2016 Document Reviewed: 09/27/2014 Elsevier Interactive Patient Education  [name].

## 2017-12-08 LAB — WET PREP BY MOLECULAR PROBE
CANDIDA SPECIES: NOT DETECTED
Gardnerella vaginalis: NOT DETECTED
MICRO NUMBER:: 90147419
SPECIMEN QUALITY:: ADEQUATE
TRICHOMONAS VAG: NOT DETECTED

## 2017-12-08 NOTE — Assessment & Plan Note (Addendum)
Anticipate steroid responsive dermatosis Continue vaseline, Rx betamethasone x 1 month and if no improvement discussed GYN referral. Pt agrees with plan.

## 2017-12-15 DIAGNOSIS — Z86010 Personal history of colonic polyps: Secondary | ICD-10-CM | POA: Diagnosis not present

## 2017-12-15 DIAGNOSIS — K760 Fatty (change of) liver, not elsewhere classified: Secondary | ICD-10-CM | POA: Diagnosis not present

## 2017-12-21 ENCOUNTER — Ambulatory Visit (INDEPENDENT_AMBULATORY_CARE_PROVIDER_SITE_OTHER): Payer: Medicare Other | Admitting: Family Medicine

## 2017-12-21 ENCOUNTER — Encounter: Payer: Self-pay | Admitting: Family Medicine

## 2017-12-21 VITALS — BP 126/70 | HR 91 | Temp 97.6°F | Wt 182.0 lb

## 2017-12-21 DIAGNOSIS — E1121 Type 2 diabetes mellitus with diabetic nephropathy: Secondary | ICD-10-CM

## 2017-12-21 LAB — RENAL FUNCTION PANEL
ALBUMIN: 4.6 g/dL (ref 3.5–5.2)
BUN: 21 mg/dL (ref 6–23)
CALCIUM: 9.8 mg/dL (ref 8.4–10.5)
CO2: 27 mEq/L (ref 19–32)
CREATININE: 1.06 mg/dL (ref 0.40–1.20)
Chloride: 103 mEq/L (ref 96–112)
GFR: 53.95 mL/min — AB (ref >60.00)
GLUCOSE: 166 mg/dL — AB (ref 70–99)
Phosphorus: 3.5 mg/dL (ref 2.3–4.6)
Potassium: 4.3 mEq/L (ref 3.5–5.1)
Sodium: 138 mEq/L (ref 135–145)

## 2017-12-21 LAB — HEMOGLOBIN A1C: HEMOGLOBIN A1C: 7.2 % — AB (ref 4.6–6.5)

## 2017-12-21 NOTE — Progress Notes (Signed)
BP 126/70 (BP Location: Left Arm, Patient Position: Sitting, Cuff Size: Normal)   Pulse 91   Temp 97.6 F (36.4 C) (Oral)   Wt 182 lb (82.6 kg)   SpO2 93%   BMI 34.39 kg/m    CC: f/u visit Subjective:    Patient ID: Katherine Oliver, female    DOB: [birth year], 72 y.o.   MRN: [id number]  HPI: Katherine Oliver is a 72 y.o. 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 Betamethasone cream caused mild burning so she stopped this week - only using [place]. Seems to be doing well.   DM - does not regularly check sugars. Compliant with antihyperglycemic regimen which includes: diet controlled. Denies hypoglycemic symptoms. Denies paresthesias. Last diabetic eye exam 06/2017. Pneumovax: 05/2012. Prevnar: 2015. Glucometer brand: does not have one. DSME: 01/2016 at [facility]. Foot exam last done 02/2017 Lab Results  Component Value Date   HGBA1C 7.1 (H) 08/10/2017   Lab Results  Component Value Date   CREATININE 1.20 08/10/2017    Diabetic Foot Exam - Simple   Simple Foot Form Diabetic Foot exam was performed with the following findings:  Yes 12/21/2017  8:36 AM  Visual Inspection No deformities, no ulcerations, no other skin breakdown bilaterally:  Yes Sensation Testing Intact to touch and monofilament testing bilaterally:  Yes Pulse Check Posterior Tibialis and Dorsalis pulse intact bilaterally:  Yes Comments    Lab Results  Component Value Date   MICROALBUR 2.1 (H) 12/31/2015     Upcoming colonoscopy 02/2018 [name]).   Relevant past medical, surgical, family and social history reviewed and updated as indicated. Interim medical history since our last visit reviewed. Allergies and medications reviewed and updated. Outpatient Medications Prior to Visit  Medication Sig Dispense Refill  . amLODipine (NORVASC) 10 MG tablet Take  1 tablet (10 mg total) by mouth daily. 90 tablet 3  . aspirin 81 MG tablet Take 81 mg by mouth daily.    . benazepril (LOTENSIN) 20 MG tablet Take 1 tablet (20 mg total) by mouth daily. 90 tablet 3  . betamethasone dipropionate (DIPROLENE) 0.05 % ointment Apply topically 2 (two) times daily. For 1 week then nightly for 1 month 50 g 0  . Calcium Carbonate-Vitamin D (CALCIUM 500/VITAMIN D PO) Take 2 tablets by mouth daily.    . diphenhydrAMINE (BENADRYL) 25 MG tablet Take 25 mg by mouth at bedtime as needed.    . fenofibrate 160 MG tablet Take 1 tablet (160 mg total) by mouth daily. 90 tablet 3  . fluticasone (FLONASE) 50 MCG/ACT nasal spray USE 2 SPRAYS INTO BOTH  NOSTRILS DAILY AS NEEDED 48 g 3  . hydrochlorothiazide (MICROZIDE) 12.5 MG capsule Take 1 capsule (12.5 mg total) by mouth daily. 90 capsule 3  . Krill Oil Omega-3 500 MG CAPS Take 1 capsule by mouth daily.    . Multiple Vitamin (MULTIVITAMIN) tablet Take 1 tablet by mouth daily.    . naproxen sodium (ANAPROX) 220 MG tablet Take 220 mg by mouth as needed.    . rosuvastatin (CRESTOR) 5 MG tablet Take 1 tablet (5 mg total) by mouth at bedtime. 90 tablet 3  . fish oil-omega-3 fatty acids 1000 MG capsule Take 3 g by mouth daily.      No facility-administered medications prior to visit.      Per HPI unless specifically indicated  in ROS section below Review of Systems     Objective:    BP 126/70 (BP Location: Left Arm, Patient Position: Sitting, Cuff Size: Normal)   Pulse 91   Temp 97.6 F (36.4 C) (Oral)   Wt 182 lb (82.6 kg)   SpO2 93%   BMI 34.39 kg/m   Wt Readings from Last 3 Encounters:  12/21/17 182 lb (82.6 kg)  12/07/17 182 lb (82.6 kg)  08/18/17 180 lb (81.6 kg)    Physical Exam  Constitutional: She appears well-developed and well-nourished. No distress.  HENT:  Head: Normocephalic and atraumatic.  Right Ear: External ear normal.  Left Ear: External ear normal.  Nose: Nose normal.  Mouth/Throat: Oropharynx is  clear and moist. No oropharyngeal exudate.  Eyes: Conjunctivae and EOM are normal. Pupils are equal, round, and reactive to light. No scleral icterus.  Neck: Normal range of motion. Neck supple.  Cardiovascular: Normal rate, regular rhythm, normal heart sounds and intact distal pulses.  No murmur heard. Pulmonary/Chest: Effort normal and breath sounds normal. No respiratory distress. She has no wheezes. She has no rales.  Musculoskeletal: She exhibits no edema.  See HPI for foot exam if done  Lymphadenopathy:    She has no cervical adenopathy.  Skin: Skin is warm and dry. No rash noted.  Psychiatric: She has a normal mood and affect.  Nursing note and vitals reviewed.  Results for orders placed or performed in visit on 12/07/17  WET PREP BY MOLECULAR PROBE  Result Value Ref Range   MICRO NUMBER: [id number]    SPECIMEN QUALITY: ADEQUATE    SOURCE: VAGINAL FLUID    STATUS: FINAL    Trichomonas vaginosis Not Detected    Gardnerella vaginalis Not Detected    Candida species Not Detected   POCT Urinalysis Dipstick (Automated)  Result Value Ref Range   Color, UA yellow    Clarity, UA clear    Glucose, UA negative    Bilirubin, UA negative    Ketones, UA negative    Spec Grav, UA 1.015 1.010 - 1.025   Blood, UA negative    pH, UA 6.0 5.0 - 8.0   Protein, UA negative    Urobilinogen, UA 0.2 0.2 or 1.0 E.U./dL   Nitrite, UA negative    Leukocytes, UA Negative Negative      Assessment & Plan:   Problem List Items Addressed This Visit    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 nephropathy ([place]) - Primary    Chronic. Update A1c. Has not been doing well with walking routine. Does not have glucose meter. If A1c staying elevated, discussed finding out preferred glucose meter brand. Reviewed healthy diet and lifestyle changes for optimal glycemic control.       Relevant Orders   Hemoglobin A1c   Renal function panel       No orders of the defined types were placed in this  encounter.  Orders Placed This Encounter  Procedures  . Hemoglobin A1c  . Renal function panel    Follow up plan: Return in about 8 months (around 08/20/2018) for medicare wellness visit, follow up visit.  [name], MD

## 2017-12-21 NOTE — Assessment & Plan Note (Signed)
Chronic. Update A1c. Has not been doing well with walking routine. Does not have glucose meter. If A1c staying elevated, discussed finding out preferred glucose meter brand. Reviewed healthy diet and lifestyle changes for optimal glycemic control.

## 2017-12-21 NOTE — Patient Instructions (Addendum)
Labs today. We will be in touch with results.  Keep working towards diabetic diet and regular walking routine.

## 2017-12-28 ENCOUNTER — Telehealth: Payer: Self-pay | Admitting: Family Medicine

## 2017-12-28 ENCOUNTER — Encounter: Payer: Self-pay | Admitting: Family Medicine

## 2017-12-28 NOTE — Telephone Encounter (Signed)
plz call medicare at # provided to see what glucose meter brand insurance covers (which was initial request).

## 2017-12-28 NOTE — Telephone Encounter (Signed)
Copied from [place] [id number]. Topic: Quick Communication - See Telephone Encounter >> Dec 28, 2017 10:47 AM Ether Griffins B wrote: CRM for notification. See Telephone encounter for:  Pt calling in regards to Dr. Rosezetta Schlatter response to her lab results. She is ok with moving forward on the glucose meter, test strips and the metformin. Also pt has contacted [facility] and the meter and test strips should be covered at 100% as long as the brand falls under what medicare covers. Provider express line is [phone]. Please send [id number] - [place], [place] - [address] 12/28/17.

## 2017-12-29 NOTE — Telephone Encounter (Signed)
Spoke with [facility] and told I would need to contact Medicare at [phone] to see what is covered.   Called Medicare at number provided and was told to contact the provider line at [phone].

## 2018-01-01 NOTE — Telephone Encounter (Signed)
Have you had a chance to call medicare to find out preferred brand? Thanks.

## 2018-01-01 NOTE — Telephone Encounter (Signed)
Spoke with Medicare trying to find out what brand glucometer would be covered for the pt.  Told they would not be able to help me with that but provided the phone # [phone] to call.  After calling a different department of Medicare and being told again they would not be able to help me.  I was provided another phone # [phone] to call.  Per Dr. Darnell Level, I will inform the pt we have tried, after many phone calls, to see what brand glucometer would be covered with no success.  At this point, Dr. Darnell Level is recommending pt try to find out from her ins or the pharmacy to see what would be covered.  Another option would be for the pt to just choose a brand and Dr. Darnell Level sends the rx to see if it will be covered.  Spoke with pt relaying message. Says she will go [facility] the meters [facility] and choose one and will let Dr. Darnell Level know.

## 2018-01-03 MED ORDER — GLUCOSE BLOOD VI STRP: ORAL_STRIP | 3 refills | Status: DC

## 2018-01-03 MED ORDER — ACCU-CHEK AVIVA DEVI: 0 refills | Status: DC

## 2018-01-03 NOTE — Addendum Note (Signed)
Addended by: [name] on: 01/03/2018 11:48 AM   Modules accepted: Orders

## 2018-01-03 NOTE — Telephone Encounter (Addendum)
I will send in accuchek to see if covered by insurance.

## 2018-01-03 NOTE — Telephone Encounter (Signed)
See phone note on same date. We were unable to find out what insurance preferred brand was

## 2018-01-04 NOTE — Telephone Encounter (Signed)
Noted  

## 2018-01-06 ENCOUNTER — Other Ambulatory Visit: Payer: Self-pay | Admitting: Family Medicine

## 2018-01-06 MED ORDER — ACCU-CHEK SOFT TOUCH LANCETS MISC: 3 refills | Status: DC

## 2018-01-06 MED ORDER — ACCU-CHEK AVIVA DEVI: 0 refills | Status: AC

## 2018-02-04 ENCOUNTER — Encounter: Payer: Self-pay | Admitting: *Deleted

## 2018-02-05 ENCOUNTER — Ambulatory Visit
Admission: RE | Admit: 2018-02-05 | Discharge: 2018-02-05 | Disposition: A | Discharge status: Home / Self Care | Payer: Medicare Other | Source: Ambulatory Visit | Attending: Gastroenterology | Admitting: Gastroenterology

## 2018-02-05 ENCOUNTER — Encounter
Admission: RE | Disposition: A | Discharge status: Home / Self Care | Payer: Self-pay | Source: Ambulatory Visit | Attending: Gastroenterology

## 2018-02-05 ENCOUNTER — Encounter: Payer: Self-pay | Admitting: *Deleted

## 2018-02-05 ENCOUNTER — Ambulatory Visit: Payer: Medicare Other | Admitting: Anesthesiology

## 2018-02-05 DIAGNOSIS — E1122 Type 2 diabetes mellitus with diabetic chronic kidney disease: Secondary | ICD-10-CM | POA: Diagnosis not present

## 2018-02-05 DIAGNOSIS — Z79899 Other long term (current) drug therapy: Secondary | ICD-10-CM | POA: Insufficient documentation

## 2018-02-05 DIAGNOSIS — D123 Benign neoplasm of transverse colon: Secondary | ICD-10-CM | POA: Diagnosis not present

## 2018-02-05 DIAGNOSIS — D124 Benign neoplasm of descending colon: Secondary | ICD-10-CM | POA: Insufficient documentation

## 2018-02-05 DIAGNOSIS — E1121 Type 2 diabetes mellitus with diabetic nephropathy: Secondary | ICD-10-CM | POA: Insufficient documentation

## 2018-02-05 DIAGNOSIS — K635 Polyp of colon: Secondary | ICD-10-CM | POA: Diagnosis not present

## 2018-02-05 DIAGNOSIS — Z86010 Personal history of colonic polyps: Secondary | ICD-10-CM | POA: Diagnosis not present

## 2018-02-05 DIAGNOSIS — Z1211 Encounter for screening for malignant neoplasm of colon: Secondary | ICD-10-CM | POA: Insufficient documentation

## 2018-02-05 DIAGNOSIS — Z7982 Long term (current) use of aspirin: Secondary | ICD-10-CM | POA: Diagnosis not present

## 2018-02-05 DIAGNOSIS — I10 Essential (primary) hypertension: Secondary | ICD-10-CM | POA: Insufficient documentation

## 2018-02-05 DIAGNOSIS — Z800 Family history of malignant neoplasm of digestive organs: Secondary | ICD-10-CM | POA: Diagnosis not present

## 2018-02-05 DIAGNOSIS — K5790 Diverticulosis of intestine, part unspecified, without perforation or abscess without bleeding: Secondary | ICD-10-CM | POA: Diagnosis not present

## 2018-02-05 DIAGNOSIS — Z8719 Personal history of other diseases of the digestive system: Secondary | ICD-10-CM | POA: Diagnosis not present

## 2018-02-05 DIAGNOSIS — E785 Hyperlipidemia, unspecified: Secondary | ICD-10-CM | POA: Insufficient documentation

## 2018-02-05 DIAGNOSIS — I129 Hypertensive chronic kidney disease with stage 1 through stage 4 chronic kidney disease, or unspecified chronic kidney disease: Secondary | ICD-10-CM | POA: Diagnosis not present

## 2018-02-05 DIAGNOSIS — K5730 Diverticulosis of large intestine without perforation or abscess without bleeding: Secondary | ICD-10-CM | POA: Insufficient documentation

## 2018-02-05 DIAGNOSIS — N183 Chronic kidney disease, stage 3 (moderate): Secondary | ICD-10-CM | POA: Diagnosis not present

## 2018-02-05 DIAGNOSIS — J309 Allergic rhinitis, unspecified: Secondary | ICD-10-CM | POA: Diagnosis not present

## 2018-02-05 DIAGNOSIS — D126 Benign neoplasm of colon, unspecified: Secondary | ICD-10-CM | POA: Diagnosis not present

## 2018-02-05 HISTORY — PX: COLONOSCOPY WITH PROPOFOL: SHX5780

## 2018-02-05 SURGERY — COLONOSCOPY WITH PROPOFOL
Anesthesia: General

## 2018-02-05 MED ORDER — SODIUM CHLORIDE 0.9 % IV SOLN
INTRAVENOUS | Status: DC
  Administered 2018-02-05: 11:00:00 via INTRAVENOUS

## 2018-02-05 MED ORDER — PROPOFOL 10 MG/ML IV BOLUS
INTRAVENOUS | Status: DC | PRN
  Administered 2018-02-05: 70 mg via INTRAVENOUS

## 2018-02-05 MED ORDER — SODIUM CHLORIDE 0.9 % IV SOLN: INTRAVENOUS | Status: DC

## 2018-02-05 MED ORDER — PROPOFOL 500 MG/50ML IV EMUL
INTRAVENOUS | Status: DC | PRN
  Administered 2018-02-05: 150 ug/kg/min via INTRAVENOUS

## 2018-02-05 NOTE — OR Nursing (Signed)
Cecum polyp not retreaved

## 2018-02-05 NOTE — Anesthesia Post-op Follow-up Note (Signed)
Anesthesia QCDR form completed.        

## 2018-02-05 NOTE — H&P (Signed)
Outpatient short stay form Pre-procedure 02/05/2018 11:06 AM Lollie Sails MD  Primary Physician: Dr Gasper Sells  Reason for visit: Colonoscopy  History of present illness: Patient is a 72 year old female presenting today as above.  She has a personal history of adenomatous colon polyps with her last colonoscopy being 09/01/2012.  She also has a family history of colon cancer in a primary relative father.  Tolerated prep well.  She does take 81 mg aspirin that is been held.  She takes no other aspirin products or blood thinning agent.    Current Facility-Administered Medications:  .  0.9 %  sodium chloride infusion, , Intravenous, Continuous, Lollie Sails, MD, Last Rate: 20 mL/hr at 02/05/18 1034 .  0.9 %  sodium chloride infusion, , Intravenous, Continuous, Lollie Sails, MD  Medications Prior to Admission  Medication Sig Dispense Refill Last Dose  . amLODipine (NORVASC) 10 MG tablet Take 1 tablet (10 mg total) by mouth daily. 90 tablet 3 02/05/2018 at Unknown time  . aspirin 81 MG tablet Take 81 mg by mouth daily.   Past Week at Unknown time  . benazepril (LOTENSIN) 20 MG tablet Take 1 tablet (20 mg total) by mouth daily. 90 tablet 3 02/05/2018 at Unknown time  . Calcium Carbonate-Vitamin D (CALCIUM 500/VITAMIN D PO) Take 2 tablets by mouth daily.   Past Month at Unknown time  . diphenhydrAMINE (BENADRYL) 25 MG tablet Take 25 mg by mouth at bedtime as needed.   02/04/2018 at Unknown time  . fenofibrate 160 MG tablet Take 1 tablet (160 mg total) by mouth daily. 90 tablet 3 02/04/2018 at Unknown time  . fluticasone (FLONASE) 50 MCG/ACT nasal spray USE 2 SPRAYS INTO BOTH  NOSTRILS DAILY AS NEEDED 48 g 3 02/05/2018 at Unknown time  . hydrochlorothiazide (MICROZIDE) 12.5 MG capsule Take 1 capsule (12.5 mg total) by mouth daily. 90 capsule 3 02/04/2018 at Unknown time  . Krill Oil Omega-3 500 MG CAPS Take 1 capsule by mouth daily.   Past Week at Unknown time  . Multiple Vitamin  (MULTIVITAMIN) tablet Take 1 tablet by mouth daily.   Past Month at Unknown time  . rosuvastatin (CRESTOR) 5 MG tablet Take 1 tablet (5 mg total) by mouth at bedtime. 90 tablet 3 Past Week at Unknown time  . betamethasone dipropionate (DIPROLENE) 0.05 % ointment Apply topically 2 (two) times daily. For 1 week then nightly for 1 month (Patient not taking: Reported on 02/05/2018) 50 g 0 Not Taking at Unknown time  . Blood Glucose Monitoring Suppl (ACCU-CHEK AVIVA) device Use as instructed to check sugars once daily E11.21 1 each 0   . glucose blood (ACCU-CHEK AVIVA) test strip Use as directed to check sugars once daily E11.21 100 each 3   . Lancets (ACCU-CHEK SOFT TOUCH) lancets Use as instructed to check sugars once daily E11.21 100 each 3   . naproxen sodium (ANAPROX) 220 MG tablet Take 220 mg by mouth as needed.   Not Taking at Unknown time     Allergies  Allergen Reactions  . Azithromycin Itching    Okay if takes benadryl along with it  . Sulfa Antibiotics   . Tapentadol   . Adhesive [Tape] Rash    Paper tape - blisters     Past Medical History:  Diagnosis Date  . Allergic rhinitis   . Arthritis   . 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 nephropathy ([place])    DSME at [facility] 01/2016   . GERD (gastroesophageal reflux disease)   . Hepatic steatosis    by abd [place] 05/2012, mild transaminitis - normal iron sat and viral hep panel (2011), stable [place] 2017  . History of chicken pox   . History of measles   . History of recurrent UTIs    on chronic keflex  . HLD (hyperlipidemia)   . HTN (hypertension)   . 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 Kidney cyst, acquired 01/2016   L kidney by [place]  . Kidney stone 01/2016   L kidney by [place]  . Lung nodules 11/2013   overall stable on f/u CT 01/2016  . Osteopenia 06/2013   mild, forearm T -1.1,  hip and spine WNL  . Polycythemia    mild, stable (2013)  . Rosacea    metrogel    Review of systems:      Physical Exam    Heart and lungs: Regular rate and rhythm without rub or gallop, lungs are bilaterally clear.    HEENT: Normocephalic atraumatic eyes are anicteric    Other:    Pertinant exam for procedure: Soft nontender nondistended bowel sounds positive normoactive.    Planned proceedures: Colonoscopy and indicated procedure. I have discussed the risks benefits and complications of procedures to include not limited to bleeding, infection, perforation and the risk of sedation and the patient wishes to proceed.    Lollie Sails, MD Gastroenterology 02/05/2018  11:06 AM

## 2018-02-05 NOTE — Anesthesia Preprocedure Evaluation (Signed)
Anesthesia Evaluation  Patient identified by MRN, date of birth, ID band Patient awake    Reviewed: Allergy & Precautions, H&P , NPO status , Patient's Chart, lab work & pertinent test results, reviewed documented beta blocker date and time   Airway Mallampati: II   Neck ROM: full    Dental  (+) Poor Dentition, Teeth Intact   Pulmonary neg pulmonary ROS,    Pulmonary exam normal        Cardiovascular Exercise Tolerance: Poor hypertension, On Medications negative cardio ROS Normal cardiovascular exam Rhythm:regular Rate:Normal     Neuro/Psych negative neurological ROS  negative psych ROS   GI/Hepatic negative GI ROS, Neg liver ROS, GERD  ,  Endo/Other  negative endocrine ROSdiabetes  Renal/GU Renal diseasenegative Renal ROS  negative genitourinary   Musculoskeletal   Abdominal   Peds  Hematology negative hematology ROS (+)   Anesthesia Other Findings Past Medical History: No date: Allergic rhinitis No date: Arthritis 08/2014: Breast mass, right     Comment:  biopsy benign - [place] 09/2008: Colon polyp     Comment:  tubulovillous adenoma, rpt 3-5 yrs No date: 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  nephropathy ([place])     Comment:  DSME at [facility] 01/2016  No date: GERD (gastroesophageal reflux disease) No date: Hepatic steatosis     Comment:  by abd [place] 05/2012, mild transaminitis - normal iron sat               and viral hep panel (2011), stable [place] 2017 No date: History of chicken pox No date: History of measles No date: History of recurrent UTIs     Comment:  on chronic keflex No date: HLD (hyperlipidemia) No date: HTN (hypertension) 06/2014: Hypertensive retinopathy of both eyes, grade 1     Comment:  [place] 01/2016: Kidney cyst, acquired     Comment:  L kidney by [place] 01/2016: Kidney stone     Comment:  L kidney by [place] 11/2013: Lung nodules     Comment:  overall stable on f/u CT  01/2016 06/2013: Osteopenia     Comment:  mild, forearm T -1.1, hip and spine WNL No date: Polycythemia     Comment:  mild, stable (2013) No date: Rosacea     Comment:  metrogel Past Surgical History: 1987: APPENDECTOMY 1963: BREAST BIOPSY; Right     Comment:  benign 08/2014: BREAST BIOPSY; Right     Comment:  benign- core 04/2004: cardiolite stress test     Comment:  normal 9983;3825: CESAREAN SECTION     Comment:  x2 2003: CHOLECYSTECTOMY 09/26/2008: COLONOSCOPY     Comment:  adenomatous polyp, rpt 3-5 yrs 08/2012: COLONOSCOPY     Comment:  adenomatous polyps, diverticulosis, rec rpt 5 yrs               [name]) 2003: dexa     Comment:  normal 06/2013: dexa     Comment:  [facility] - Tscore -1.1 forearm, normal spine and femur 2007;2010;2011: TRIGGER FINGER RELEASE     Comment:  bilateral 02/2017: [place]: VAGINAL HYSTERECTOMY     Comment:  for menorrhagia, ovaries in place BMI    Body Mass Index:  34.01 kg/m     Reproductive/Obstetrics negative OB ROS                             Anesthesia Physical Anesthesia Plan  ASA: III  Anesthesia Plan: General  Post-op Pain Management:    Induction:   PONV Risk Score and Plan:   Airway Management Planned:   Additional Equipment:   Intra-op Plan:   Post-operative Plan:   Informed Consent: I have reviewed the patients History and Physical, chart, labs and discussed the procedure including the risks, benefits and alternatives for the proposed anesthesia with the patient or authorized representative who has indicated his/her understanding and acceptance.   Dental Advisory Given  Plan Discussed with: CRNA  Anesthesia Plan Comments:         Anesthesia Quick Evaluation

## 2018-02-05 NOTE — Transfer of Care (Signed)
Immediate Anesthesia Transfer of Care Note  Patient: Katherine Oliver  Procedure(s) Performed: COLONOSCOPY WITH PROPOFOL (N/A )  Patient Location: PACU  Anesthesia Type:General  Level of Consciousness: awake, alert  and oriented  Airway & Oxygen Therapy: Patient Spontanous Breathing and Patient connected to nasal cannula oxygen  Post-op Assessment: Report given to RN  Post vital signs: Reviewed and stable  Last Vitals:  Vitals Value Taken Time  BP    Temp    Pulse 92 02/05/2018 12:10 PM  Resp 16 02/05/2018 12:10 PM  SpO2 96 % 02/05/2018 12:10 PM  Vitals shown include unvalidated device data.  Last Pain:  Vitals:   02/05/18 1008  TempSrc: Tympanic         Complications: No apparent anesthesia complications

## 2018-02-05 NOTE — Op Note (Signed)
[facility] Gastroenterology Patient Name: Katherine Oliver Procedure Date: 02/05/2018 11:06 AM MRN: [id number] Account #: [id number] Date of Birth: 1946/02/13 Admit Type: Outpatient Age: 72 Room: [facility] ENDO ROOM 3 Gender: Female Note Status: Finalized Procedure:            Colonoscopy Indications:          Personal history of colonic polyps Providers:            Lollie Sails, MD Referring MD:         [name] (Referring MD) Medicines:            Monitored Anesthesia Care Complications:        No immediate complications. Procedure:            Pre-Anesthesia Assessment:                       - ASA Grade Assessment: III - A patient with severe                        systemic disease.                       After obtaining informed consent, the colonoscope was                        passed under direct vision. Throughout the procedure,                        the patient's blood pressure, pulse, and oxygen                        saturations were monitored continuously. The                        Colonoscope was introduced through the anus and                        advanced to the the cecum, identified by appendiceal                        orifice and ileocecal valve. The quality of the bowel                        preparation was fair except the cecum was poor. Findings:      A 3 mm polyp was found in the descending colon. The polyp was sessile.       The polyp was removed with a cold snare. Resection and retrieval were       complete.      A 3 mm polyp was found in the transverse colon. The polyp was sessile.       The polyp was removed with a cold biopsy forceps. Resection and       retrieval were complete.      A 4 mm polyp was found in the cecum. The polyp was semi-pedunculated.       The polyp was removed with a cold snare. Resection was complete, but the       polyp tissue was not retrieved, lost in poor prep.      A 7 mm polyp was found in the  hepatic flexure. The polyp was sessile.  The polyp was removed with a cold snare. Resection and retrieval were       complete.      A 7 mm polyp was found in the mid transverse colon. The polyp was       sessile. The polyp was removed with a cold snare. Resection and       retrieval were complete.      Three sessile polyps were found in the descending colon. The polyps were       2 to 4 mm in size. These polyps were removed with a cold biopsy forceps       and cold snare. Resection and retrieval were completeall placed into the       same jar.      Multiple small and medium-mouthed diverticula were found in the sigmoid       colon, descending colon and transverse colon.      The digital rectal exam was normal. Impression:           - One 3 mm polyp in the descending colon, removed with                        a cold snare. Resected and retrieved.                       - One 3 mm polyp in the transverse colon, removed with                        a cold biopsy forceps. Resected and retrieved.                       - One 4 mm polyp in the cecum, removed with a cold                        snare. Complete resection. Polyp tissue not retrieved.                       - One 7 mm polyp at the hepatic flexure, removed with a                        cold snare. Resected and retrieved.                       - One 7 mm polyp in the mid transverse colon, removed                        with a cold snare. Resected and retrieved.                       - Three 2 to 4 mm polyps in the descending colon,                        removed with a cold biopsy forceps. Resected and                        retrieved.                       - Diverticulosis in the sigmoid colon, in the  descending colon and in the transverse colon. Recommendation:       - Discharge patient to home. Procedure Code(s):    --- Professional ---                       [phone], Colonoscopy, flexible; with removal of  tumor(s),                        polyp(s), or other lesion(s) by snare technique                       45380, 69, Colonoscopy, flexible; with biopsy, single                        or multiple Diagnosis Code(s):    --- Professional ---                       D12.4, Benign neoplasm of descending colon                       D12.0, Benign neoplasm of cecum                       D12.3, Benign neoplasm of transverse colon (hepatic                        flexure or splenic flexure)                       Z86.010, Personal history of colonic polyps                       K57.30, Diverticulosis of large intestine without                        perforation or abscess without bleeding CPT copyright 2017 American Medical Association. All rights reserved. The codes documented in this report are preliminary and upon coder review may  be revised to meet current compliance requirements. Lollie Sails, MD 02/05/2018 12:15:06 PM This report has been signed electronically. Number of Addenda: 0 Note Initiated On: 02/05/2018 11:06 AM Scope Withdrawal Time: 0 hours 25 minutes 26 seconds  Total Procedure Duration: 0 hours 47 minutes 44 seconds       [facility]

## 2018-02-05 NOTE — Anesthesia Procedure Notes (Signed)
Date/Time: 02/05/2018 11:22 AM Performed by: Nelda Marseille, CRNA Pre-anesthesia Checklist: Patient identified, Emergency Drugs available, Suction available, Patient being monitored and Timeout performed Oxygen Delivery Method: Nasal cannula

## 2018-02-08 ENCOUNTER — Encounter: Payer: Self-pay | Admitting: Gastroenterology

## 2018-02-08 LAB — SURGICAL PATHOLOGY

## 2018-02-09 ENCOUNTER — Encounter: Payer: Self-pay | Admitting: Family Medicine

## 2018-02-12 ENCOUNTER — Ambulatory Visit (INDEPENDENT_AMBULATORY_CARE_PROVIDER_SITE_OTHER): Payer: Medicare Other | Admitting: Family Medicine

## 2018-02-12 ENCOUNTER — Encounter: Payer: Self-pay | Admitting: Family Medicine

## 2018-02-12 VITALS — BP 120/64 | HR 79 | Temp 97.8°F | Ht 61.0 in | Wt 182.5 lb

## 2018-02-12 DIAGNOSIS — E1121 Type 2 diabetes mellitus with diabetic nephropathy: Secondary | ICD-10-CM | POA: Diagnosis not present

## 2018-02-12 DIAGNOSIS — N3000 Acute cystitis without hematuria: Secondary | ICD-10-CM | POA: Insufficient documentation

## 2018-02-12 LAB — POC URINALSYSI DIPSTICK (AUTOMATED)
BILIRUBIN UA: NEGATIVE
GLUCOSE UA: NEGATIVE
KETONES UA: NEGATIVE
Nitrite, UA: NEGATIVE
Protein, UA: NEGATIVE
RBC UA: NEGATIVE
SPEC GRAV UA: 1.025 (ref 1.010–1.025)
UROBILINOGEN UA: 0.2 U/dL
pH, UA: 6 (ref 5.0–8.0)

## 2018-02-12 MED ORDER — METFORMIN HCL 500 MG PO TABS: 500.0000 mg | ORAL_TABLET | Freq: Every day | ORAL | 3 refills | Status: DC

## 2018-02-12 MED ORDER — CEPHALEXIN 500 MG PO CAPS
500.0000 mg | ORAL_CAPSULE | Freq: Two times a day (BID) | ORAL | 0 refills | Status: DC
Start: 2018-02-12 — End: 2018-05-19

## 2018-02-12 NOTE — Patient Instructions (Addendum)
Sugars are doing better!  Let's start metformin 500mg  once daily with breakfast.  I do think you have UTI - treat with keflex 500mg  twice daily for 1 week. Urine culture sent. Push fluids and rest. Let us know if not improving with treatment.   Urinary Tract Infection, Adult A urinary tract infection (UTI) is an infection of any part of the urinary tract. The urinary tract includes the:  Kidneys.  Ureters.  Bladder.  Urethra.  These organs make, store, and get rid of pee (urine) in the body. Follow these instructions at home:  Take over-the-counter and prescription medicines only as told by your doctor.  If you were prescribed an antibiotic medicine, take it as told by your doctor. Do not stop taking the antibiotic even if you start to feel better.  Avoid the following drinks: ? Alcohol. ? Caffeine. ? Tea. ? Carbonated drinks.  Drink enough fluid to keep your pee clear or pale yellow.  Keep all follow-up visits as told by your doctor. This is important.  Make sure to: ? Empty your bladder often and completely. Do not to hold pee for long periods of time. ? Empty your bladder before and after sex. ? Wipe from front to back after a bowel movement if you are female. Use each tissue one time when you wipe. Contact a doctor if:  You have back pain.  You have a fever.  You feel sick to your stomach (nauseous).  You throw up (vomit).  Your symptoms do not get better after 3 days.  Your symptoms go away and then come back. Get help right away if:  You have very bad back pain.  You have very bad lower belly (abdominal) pain.  You are throwing up and cannot keep down any medicines or water. This information is not intended to replace advice given to you by your health care provider. Make sure you discuss any questions you have with your health care provider. Document Released: 04/07/2008 Document Revised: 03/27/2016 Document Reviewed: 09/10/2015 Elsevier Interactive  Patient Education  [name].

## 2018-02-12 NOTE — Assessment & Plan Note (Signed)
Chronic, diet controlled. She has started monitoring sugars more regularly. Will start metformin 500mg  daily. Discussed common side effects. RTC 3-4 mo f/u visit.

## 2018-02-12 NOTE — Progress Notes (Signed)
BP 120/64 (BP Location: Left Arm, Patient Position: Sitting, Cuff Size: Normal)   Pulse 79   Temp 97.8 F (36.6 C) (Oral)   Ht 5\' 1"  (1.549 m)   Wt 182 lb 8 oz (82.8 kg)   SpO2 92%   BMI 34.48 kg/m    CC: f/u visit Subjective:    Patient ID: Katherine Oliver, female    DOB: [birth year], 72 y.o.   MRN: [id number]  HPI: Katherine Oliver is a 72 y.o. female presenting on 02/12/2018 for Diabetes (Here for follow-up. Pt accompanied by husband. ); Dysuria (Started a few months ago. H/o urinary issues. Has not seen urology in yrs.); and Urinary Frequency   DM - does regularly check sugars onc edaily in the mornings - 110-120s. Compliant with antihyperglycemic regimen which includes: diet controlled. Denies low sugars or hypoglycemic symptoms. Denies paresthesias. Last diabetic eye exam 06/2017. Pneumovax: 2013. Prevnar: 2015. Glucometer brand: accuchek aviva. DSME: 01/2016 [facility]. Lab Results  Component Value Date   HGBA1C 7.2 (H) 12/21/2017   Diabetic Foot Exam - Simple   No data filed     Lab Results  Component Value Date   MICROALBUR 2.1 (H) 12/31/2015     Several month h/o dysuria with urinary frequency and urgency worse at night time - she's been taking urocalm over the last 2 days with benefit. Has not seen urologist recently. She has been using bactene spray which helps some. No fevers, flank pain, nausea, or abdominal pain. No hematuria. No vaginal discharge. She tried monistat without benefit.   Recent vulvar dermatitis that did improve.   Colonoscopy last week.   Relevant past medical, surgical, family and social history reviewed and updated as indicated. Interim medical history since our last visit reviewed. Allergies and medications reviewed and updated. Outpatient Medications Prior to Visit  Medication Sig Dispense Refill  . acetaminophen (TYLENOL) 500 MG tablet Take 500 mg by mouth every 6 (six) hours as needed.    Marland Kitchen amLODipine (NORVASC) 10 MG  tablet Take 1 tablet (10 mg total) by mouth daily. 90 tablet 3  . aspirin 81 MG tablet Take 81 mg by mouth daily.    . benazepril (LOTENSIN) 20 MG tablet Take 1 tablet (20 mg total) by mouth daily. 90 tablet 3  . Blood Glucose Monitoring Suppl (ACCU-CHEK AVIVA) device Use as instructed to check sugars once daily E11.21 1 each 0  . Calcium Carbonate-Vitamin D (CALCIUM 500/VITAMIN D PO) Take 2 tablets by mouth daily.    . diphenhydrAMINE (BENADRYL) 25 MG tablet Take 25 mg by mouth at bedtime as needed.    . fenofibrate 160 MG tablet Take 1 tablet (160 mg total) by mouth daily. 90 tablet 3  . fluticasone (FLONASE) 50 MCG/ACT nasal spray USE 2 SPRAYS INTO BOTH  NOSTRILS DAILY AS NEEDED 48 g 3  . glucose blood (ACCU-CHEK AVIVA) test strip Use as directed to check sugars once daily E11.21 100 each 3  . hydrochlorothiazide (MICROZIDE) 12.5 MG capsule Take 1 capsule (12.5 mg total) by mouth daily. 90 capsule 3  . Krill Oil Omega-3 500 MG CAPS Take 1 capsule by mouth daily.    . Lancets (ACCU-CHEK SOFT TOUCH) lancets Use as instructed to check sugars once daily E11.21 100 each 3  . Multiple Vitamin (MULTIVITAMIN) tablet Take 1 tablet by mouth daily.    . rosuvastatin (CRESTOR) 5 MG tablet Take 1 tablet (5 mg total) by mouth at bedtime. 90 tablet 3  . betamethasone dipropionate (DIPROLENE) 0.05 %  ointment Apply topically 2 (two) times daily. For 1 week then nightly for 1 month (Patient not taking: Reported on 02/05/2018) 50 g 0  . naproxen sodium (ANAPROX) 220 MG tablet Take 220 mg by mouth as needed.     No facility-administered medications prior to visit.      Per HPI unless specifically indicated in ROS section below Review of Systems     Objective:    BP 120/64 (BP Location: Left Arm, Patient Position: Sitting, Cuff Size: Normal)   Pulse 79   Temp 97.8 F (36.6 C) (Oral)   Ht 5\' 1"  (1.549 m)   Wt 182 lb 8 oz (82.8 kg)   SpO2 92%   BMI 34.48 kg/m   Wt Readings from Last 3 Encounters:    02/12/18 182 lb 8 oz (82.8 kg)  02/05/18 180 lb (81.6 kg)  12/21/17 182 lb (82.6 kg)    Physical Exam  Constitutional: She appears well-developed and well-nourished. No distress.  HENT:  Head: Normocephalic and atraumatic.  Mouth/Throat: Oropharynx is clear and moist. No oropharyngeal exudate.  Eyes: Pupils are equal, round, and reactive to light. Conjunctivae and EOM are normal.  Cardiovascular: Normal rate, regular rhythm, normal heart sounds and intact distal pulses.  No murmur heard. Pulmonary/Chest: Effort normal and breath sounds normal. No stridor. No respiratory distress. She has no wheezes. She has no rales. She exhibits no tenderness.  Abdominal: Soft. Bowel sounds are normal. She exhibits no distension and no mass. There is no hepatosplenomegaly. There is no tenderness. There is no rebound, no guarding and no CVA tenderness. No hernia.  Psychiatric: She has a normal mood and affect.  Nursing note and vitals reviewed.  Results for orders placed or performed in visit on 02/12/18  POCT Urinalysis Dipstick (Automated)  Result Value Ref Range   Color, UA yellow    Clarity, UA clear    Glucose, UA negative    Bilirubin, UA negative    Ketones, UA negative    Spec Grav, UA 1.025 1.010 - 1.025   Blood, UA negative    pH, UA 6.0 5.0 - 8.0   Protein, UA negative    Urobilinogen, UA 0.2 0.2 or 1.0 E.U./dL   Nitrite, UA negative    Leukocytes, UA Large (3+) (A) Negative  Micro: WBC 10-20 RBC rare Bact 1+ rods Casts none Epi few UCx sent    Assessment & Plan:   Problem List Items Addressed This Visit    Acute cystitis without hematuria    UA/micro consistent with UTI - treat with keflex course. UCx sent. Handout provided.       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 She has started monitoring sugars more regularly.  Will start metformin 500mg  daily. Discussed common side effects. RTC 3-4 mo f/u visit.       Relevant Medications   metFORMIN (GLUCOPHAGE) 500 MG tablet       Meds ordered this encounter  Medications  . metFORMIN (GLUCOPHAGE) 500 MG tablet    Sig: Take 1 tablet (500 mg total) by mouth daily with breakfast.    Dispense:  90 tablet    Refill:  3  . cephALEXin (KEFLEX) 500 MG capsule    Sig: Take 1 capsule (500 mg total) by mouth 2 (two) times daily.    Dispense:  14 capsule    Refill:  0   Orders Placed This Encounter  Procedures  . Urine Culture  . POCT Urinalysis Dipstick (Automated)    Follow up plan: Return in about 4 months (around 06/14/2018), or if symptoms worsen or fail to improve, for follow up visit.  [name], MD

## 2018-02-12 NOTE — Assessment & Plan Note (Signed)
UA/micro consistent with UTI - treat with keflex course. UCx sent. Handout provided.

## 2018-02-13 LAB — URINE CULTURE
MICRO NUMBER:: 90453735
SPECIMEN QUALITY:: ADEQUATE

## 2018-02-15 ENCOUNTER — Encounter: Payer: Self-pay | Admitting: Family Medicine

## 2018-02-17 NOTE — Anesthesia Postprocedure Evaluation (Signed)
Anesthesia Post Note  Patient: Katherine Oliver  Procedure(s) Performed: COLONOSCOPY WITH PROPOFOL (N/A )  Patient location during evaluation: PACU Anesthesia Type: MAC and General Level of consciousness: awake and alert Pain management: pain level controlled Vital Signs Assessment: post-procedure vital signs reviewed and stable Respiratory status: spontaneous breathing, nonlabored ventilation, respiratory function stable and patient connected to nasal cannula oxygen Cardiovascular status: stable and blood pressure returned to baseline Postop Assessment: no apparent nausea or vomiting Anesthetic complications: no     Last Vitals:  Vitals:   02/05/18 1230 02/05/18 1239  BP: 113/70 (!) 115/95  Pulse: 80 84  Resp: 18 19  Temp:    SpO2: 96% 97%    Last Pain:  Vitals:   02/05/18 1008  TempSrc: Tympanic                 Molli Barrows

## 2018-03-21 ENCOUNTER — Encounter: Payer: Self-pay | Admitting: Family Medicine

## 2018-05-19 ENCOUNTER — Ambulatory Visit (INDEPENDENT_AMBULATORY_CARE_PROVIDER_SITE_OTHER): Payer: Medicare Other | Admitting: Family Medicine

## 2018-05-19 ENCOUNTER — Encounter: Payer: Self-pay | Admitting: Family Medicine

## 2018-05-19 VITALS — BP 132/66 | HR 101 | Temp 97.8°F | Ht 61.0 in | Wt 178.8 lb

## 2018-05-19 DIAGNOSIS — E1121 Type 2 diabetes mellitus with diabetic nephropathy: Secondary | ICD-10-CM | POA: Diagnosis not present

## 2018-05-19 LAB — POCT GLYCOSYLATED HEMOGLOBIN (HGB A1C): HEMOGLOBIN A1C: 6.4 % — AB (ref 4.0–5.6)

## 2018-05-19 MED ORDER — METFORMIN HCL ER 500 MG PO TB24: 500.0000 mg | ORAL_TABLET | Freq: Every day | ORAL | 1 refills | Status: DC

## 2018-05-19 NOTE — Patient Instructions (Addendum)
Trial extended release metformin XR 1 tablet daily take with breakfast. Update [place] with effect and if better tolerated, we will send in to [facility].  A1c today. Return in 3-4 months for wellness visit with Katha Cabal and f/u with me

## 2018-05-19 NOTE — Progress Notes (Signed)
BP 132/66 (BP Location: Left Arm, Patient Position: Sitting, Cuff Size: Normal)   Pulse (!) 101   Temp 97.8 F (36.6 C) (Oral)   Ht 5\' 1"  (1.549 m)   Wt 178 lb 12 oz (81.1 kg)   SpO2 94%   BMI 33.77 kg/m    CC: 3 mo DM f/u visit Subjective:    Patient ID: Katherine Oliver, female    DOB: [birth year], 72 y.o.   MRN: [id number]  HPI: Katherine Oliver is a 72 y.o. female presenting on 05/19/2018 for Diabetes (Here for 3 mo f/u.)   Stressed with husband's illness - he's currently undergoing chemo.  4 lb weight los over the last few weeks. Diet changes - husband's taste preferences have changed.   DM - does regularly check sugars 120-130s averaging fasting. Compliant with antihyperglycemic regimen which includes: metformin 500mg  daily. Some diarrhea with this. Manages with imodium. Denies low sugars or hypoglycemic symptoms. Denies paresthesias. Last diabetic eye exam 06/2017. Pneumovax: 2013. Prevnar: 2015. Glucometer brand: accu-chek. DSME: at [facility] 01/2016. Lab Results  Component Value Date   HGBA1C 6.4 (A) 05/19/2018   Diabetic Foot Exam - Simple   Simple Foot Form Diabetic Foot exam was performed with the following findings:  Yes 05/19/2018 10:24 AM  Visual Inspection No deformities, no ulcerations, no other skin breakdown bilaterally:  Yes Sensation Testing Intact to touch and monofilament testing bilaterally:  Yes Pulse Check Posterior Tibialis and Dorsalis pulse intact bilaterally:  Yes Comments    Lab Results  Component Value Date   MICROALBUR 2.1 (H) 12/31/2015     Relevant past medical, surgical, family and social history reviewed and updated as indicated. Interim medical history since our last visit reviewed. Allergies and medications reviewed and updated. Outpatient Medications Prior to Visit  Medication Sig Dispense Refill  . acetaminophen (TYLENOL) 500 MG tablet Take 500 mg by mouth every 6 (six) hours as needed.    Marland Kitchen amLODipine (NORVASC)  10 MG tablet Take 1 tablet (10 mg total) by mouth daily. 90 tablet 3  . aspirin 81 MG tablet Take 81 mg by mouth daily.    . benazepril (LOTENSIN) 20 MG tablet Take 1 tablet (20 mg total) by mouth daily. 90 tablet 3  . Blood Glucose Monitoring Suppl (ACCU-CHEK AVIVA) device Use as instructed to check sugars once daily E11.21 1 each 0  . Calcium Carbonate-Vitamin D (CALCIUM 500/VITAMIN D PO) Take 2 tablets by mouth daily.    . diphenhydrAMINE (BENADRYL) 25 MG tablet Take 25 mg by mouth at bedtime as needed.    . fenofibrate 160 MG tablet Take 1 tablet (160 mg total) by mouth daily. 90 tablet 3  . fluticasone (FLONASE) 50 MCG/ACT nasal spray USE 2 SPRAYS INTO BOTH  NOSTRILS DAILY AS NEEDED 48 g 3  . glucose blood (ACCU-CHEK AVIVA) test strip Use as directed to check sugars once daily E11.21 100 each 3  . hydrochlorothiazide (MICROZIDE) 12.5 MG capsule Take 1 capsule (12.5 mg total) by mouth daily. 90 capsule 3  . Krill Oil Omega-3 500 MG CAPS Take 1 capsule by mouth daily.    . Lancets (ACCU-CHEK SOFT TOUCH) lancets Use as instructed to check sugars once daily E11.21 100 each 3  . metFORMIN (GLUCOPHAGE) 500 MG tablet Take 1 tablet (500 mg total) by mouth daily with breakfast. 90 tablet 3  . Multiple Vitamin (MULTIVITAMIN) tablet Take 1 tablet by mouth daily.    . rosuvastatin (CRESTOR) 5 MG tablet Take 1 tablet (5  mg total) by mouth at bedtime. 90 tablet 3  . cephALEXin (KEFLEX) 500 MG capsule Take 1 capsule (500 mg total) by mouth 2 (two) times daily. 14 capsule 0   No facility-administered medications prior to visit.      Per HPI unless specifically indicated in ROS section below Review of Systems     Objective:    BP 132/66 (BP Location: Left Arm, Patient Position: Sitting, Cuff Size: Normal)   Pulse (!) 101   Temp 97.8 F (36.6 C) (Oral)   Ht 5\' 1"  (1.549 m)   Wt 178 lb 12 oz (81.1 kg)   SpO2 94%   BMI 33.77 kg/m   Wt Readings from Last 3 Encounters:  05/19/18 178 lb 12 oz (81.1  kg)  02/12/18 182 lb 8 oz (82.8 kg)  02/05/18 180 lb (81.6 kg)    Physical Exam  Constitutional: She appears well-developed and well-nourished. No distress.  HENT:  Head: Normocephalic and atraumatic.  Right Ear: External ear normal.  Left Ear: External ear normal.  Nose: Nose normal.  Mouth/Throat: Oropharynx is clear and moist. No oropharyngeal exudate.  Eyes: Pupils are equal, round, and reactive to light. Conjunctivae and EOM are normal. No scleral icterus.  Neck: Normal range of motion. Neck supple.  Cardiovascular: Normal rate, regular rhythm, normal heart sounds and intact distal pulses.  No murmur heard. Pulmonary/Chest: Effort normal and breath sounds normal. No respiratory distress. She has no wheezes. She has no rales.  Musculoskeletal: She exhibits no edema.  See HPI for foot exam if done  Lymphadenopathy:    She has no cervical adenopathy.  Skin: Skin is warm and dry. No rash noted.  Psychiatric: She has a normal mood and affect.  Nursing note and vitals reviewed.  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 Some diarrhea to this - will trial metformin XR daily and update me with results. Check A1c today. Foot exam today.       Relevant Medications   metFORMIN (GLUCOPHAGE XR) 500 MG 24 hr tablet   Other Relevant Orders   POCT glycosylated hemoglobin (Hb A1C) (Completed)       Meds ordered this encounter  Medications  . DISCONTD: metFORMIN (GLUCOPHAGE XR) 500 MG 24 hr tablet    Sig: Take 1 tablet (500 mg total) by  mouth daily with breakfast.    Dispense:  30 tablet    Refill:  1  . metFORMIN (GLUCOPHAGE XR) 500 MG 24 hr tablet    Sig: Take 1 tablet (500 mg total) by mouth daily with breakfast.    Dispense:  30 tablet    Refill:  1   Orders Placed This Encounter  Procedures  . POCT glycosylated hemoglobin (Hb A1C)    Follow up plan: Return in about 3 months (around 08/19/2018) for medicare wellness visit, follow up visit.  [name], MD

## 2018-05-19 NOTE — Assessment & Plan Note (Signed)
Chronic, stable on metformin. Some diarrhea to this - will trial metformin XR daily and update me with results. Check A1c today. Foot exam today.

## 2018-06-10 ENCOUNTER — Other Ambulatory Visit: Payer: Self-pay | Admitting: Family Medicine

## 2018-06-30 ENCOUNTER — Telehealth: Payer: Self-pay | Admitting: Family Medicine

## 2018-06-30 MED ORDER — METFORMIN HCL ER 500 MG PO TB24: 500.0000 mg | ORAL_TABLET | Freq: Every day | ORAL | 3 refills | Status: DC

## 2018-06-30 NOTE — Telephone Encounter (Signed)
Saw patient at husband's appt - extended release metformin working well - no diarrhea, improved sugar control. Requests 90d supply to mailorder.

## 2018-07-08 DIAGNOSIS — H5213 Myopia, bilateral: Secondary | ICD-10-CM | POA: Diagnosis not present

## 2018-07-08 DIAGNOSIS — E119 Type 2 diabetes mellitus without complications: Secondary | ICD-10-CM | POA: Diagnosis not present

## 2018-07-08 DIAGNOSIS — H524 Presbyopia: Secondary | ICD-10-CM | POA: Diagnosis not present

## 2018-07-08 DIAGNOSIS — H2513 Age-related nuclear cataract, bilateral: Secondary | ICD-10-CM | POA: Diagnosis not present

## 2018-07-08 DIAGNOSIS — H02045 Spastic entropion of left lower eyelid: Secondary | ICD-10-CM | POA: Diagnosis not present

## 2018-07-08 DIAGNOSIS — H04123 Dry eye syndrome of bilateral lacrimal glands: Secondary | ICD-10-CM | POA: Diagnosis not present

## 2018-07-08 LAB — HM DIABETES EYE EXAM

## 2018-07-12 ENCOUNTER — Other Ambulatory Visit: Payer: Self-pay | Admitting: Family Medicine

## 2018-07-12 DIAGNOSIS — Z1231 Encounter for screening mammogram for malignant neoplasm of breast: Secondary | ICD-10-CM

## 2018-07-13 ENCOUNTER — Encounter: Payer: Self-pay | Admitting: Family Medicine

## 2018-07-18 ENCOUNTER — Other Ambulatory Visit: Payer: Self-pay | Admitting: Family Medicine

## 2018-08-06 ENCOUNTER — Other Ambulatory Visit: Payer: Self-pay | Admitting: Family Medicine

## 2018-08-15 ENCOUNTER — Other Ambulatory Visit: Payer: Self-pay | Admitting: Family Medicine

## 2018-08-15 DIAGNOSIS — K760 Fatty (change of) liver, not elsewhere classified: Secondary | ICD-10-CM

## 2018-08-15 DIAGNOSIS — N183 Chronic kidney disease, stage 3 unspecified: Secondary | ICD-10-CM

## 2018-08-15 DIAGNOSIS — E1122 Type 2 diabetes mellitus with diabetic chronic kidney disease: Secondary | ICD-10-CM

## 2018-08-15 DIAGNOSIS — D751 Secondary polycythemia: Secondary | ICD-10-CM

## 2018-08-15 DIAGNOSIS — E782 Mixed hyperlipidemia: Secondary | ICD-10-CM

## 2018-08-15 DIAGNOSIS — E1121 Type 2 diabetes mellitus with diabetic nephropathy: Secondary | ICD-10-CM

## 2018-08-16 ENCOUNTER — Ambulatory Visit (INDEPENDENT_AMBULATORY_CARE_PROVIDER_SITE_OTHER): Payer: Medicare Other

## 2018-08-16 VITALS — BP 118/76 | HR 78 | Temp 98.0°F | Ht 61.0 in | Wt 178.5 lb

## 2018-08-16 DIAGNOSIS — E1121 Type 2 diabetes mellitus with diabetic nephropathy: Secondary | ICD-10-CM

## 2018-08-16 DIAGNOSIS — N183 Chronic kidney disease, stage 3 unspecified: Secondary | ICD-10-CM

## 2018-08-16 DIAGNOSIS — Z0000 Encounter for general adult medical examination without abnormal findings: Secondary | ICD-10-CM

## 2018-08-16 DIAGNOSIS — D751 Secondary polycythemia: Secondary | ICD-10-CM | POA: Diagnosis not present

## 2018-08-16 DIAGNOSIS — K760 Fatty (change of) liver, not elsewhere classified: Secondary | ICD-10-CM | POA: Diagnosis not present

## 2018-08-16 DIAGNOSIS — E1122 Type 2 diabetes mellitus with diabetic chronic kidney disease: Secondary | ICD-10-CM

## 2018-08-16 DIAGNOSIS — E782 Mixed hyperlipidemia: Secondary | ICD-10-CM

## 2018-08-16 DIAGNOSIS — Z23 Encounter for immunization: Secondary | ICD-10-CM

## 2018-08-16 LAB — CBC WITH DIFFERENTIAL/PLATELET
BASOS PCT: 0.6 % (ref 0.0–3.0)
Basophils Absolute: 0 10*3/uL (ref 0.0–0.1)
EOS ABS: 0 10*3/uL (ref 0.0–0.7)
EOS PCT: 0 % (ref 0.0–5.0)
HEMATOCRIT: 45.7 % (ref 36.0–46.0)
HEMOGLOBIN: 15.1 g/dL — AB (ref 12.0–15.0)
Lymphocytes Relative: 27.3 % (ref 12.0–46.0)
Lymphs Abs: 1.4 10*3/uL (ref 0.7–4.0)
MCHC: 32.9 g/dL (ref 30.0–36.0)
MCV: 83.8 fl (ref 78.0–100.0)
MONO ABS: 0.5 10*3/uL (ref 0.1–1.0)
Monocytes Relative: 8.8 % (ref 3.0–12.0)
NEUTROS ABS: 3.2 10*3/uL (ref 1.4–7.7)
Neutrophils Relative %: 63.3 % (ref 43.0–77.0)
PLATELETS: 233 10*3/uL (ref 150.0–400.0)
RBC: 5.46 Mil/uL — ABNORMAL HIGH (ref 3.87–5.11)
RDW: 14.2 % (ref 11.5–15.5)
WBC: 5.1 10*3/uL (ref 4.0–10.5)

## 2018-08-16 LAB — LIPID PANEL
CHOL/HDL RATIO: 4
Cholesterol: 155 mg/dL (ref 0–200)
HDL: 43.7 mg/dL (ref >39.00)
NONHDL: 111.21
Triglycerides: 228 mg/dL — ABNORMAL HIGH (ref 0.0–149.0)
VLDL: 45.6 mg/dL — ABNORMAL HIGH (ref 0.0–40.0)

## 2018-08-16 LAB — VITAMIN D 25 HYDROXY (VIT D DEFICIENCY, FRACTURES): VITD: 30.63 ng/mL (ref 30.00–100.00)

## 2018-08-16 LAB — LDL CHOLESTEROL, DIRECT: LDL DIRECT: 92 mg/dL

## 2018-08-16 LAB — COMPREHENSIVE METABOLIC PANEL
ALK PHOS: 43 U/L (ref 39–117)
ALT: 40 U/L — ABNORMAL HIGH (ref 0–35)
AST: 21 U/L (ref 0–37)
Albumin: 4.6 g/dL (ref 3.5–5.2)
BUN: 26 mg/dL — AB (ref 6–23)
CHLORIDE: 102 meq/L (ref 96–112)
CO2: 30 meq/L (ref 19–32)
Calcium: 10.3 mg/dL (ref 8.4–10.5)
Creatinine, Ser: 1.04 mg/dL (ref 0.40–1.20)
GFR: 55.05 mL/min — ABNORMAL LOW (ref >60.00)
GLUCOSE: 140 mg/dL — AB (ref 70–99)
Potassium: 4.3 mEq/L (ref 3.5–5.1)
Sodium: 139 mEq/L (ref 135–145)
TOTAL PROTEIN: 7.4 g/dL (ref 6.0–8.3)
Total Bilirubin: 0.4 mg/dL (ref 0.2–1.2)

## 2018-08-16 LAB — HEMOGLOBIN A1C: HEMOGLOBIN A1C: 6.5 % (ref 4.6–6.5)

## 2018-08-16 NOTE — Progress Notes (Signed)
Subjective:   Katherine Oliver is a 72 y.o. female who presents for Medicare Annual (Subsequent) preventive examination.  Review of Systems:  N/A Cardiac Risk Factors include: advanced age (>44men, >71 women);diabetes mellitus;dyslipidemia;hypertension;obesity (BMI >30kg/m2)     Objective:     Vitals: BP 118/76 (BP Location: Right Arm, Patient Position: Sitting, Cuff Size: Normal)   Pulse 78   Temp 98 F (36.7 C) (Oral)   Ht 5\' 1"  (1.549 m) Comment: no shoes  Wt 178 lb 8 oz (81 kg)   SpO2 92%   BMI 33.73 kg/m   Body mass index is 33.73 kg/m.  Advanced Directives 08/16/2018 08/10/2017 07/14/2016  Does Patient Have a Medical Advance Directive? No Yes Yes  Type of Advance Directive - [place];Living will [place];Living will  Does patient want to make changes to medical advance directive? - - No - Patient declined  Copy of [place] in Chart? - No - copy requested No - copy requested  Would patient like information on creating a medical advance directive? No - Patient declined - -    Tobacco Social History   Tobacco Use  Smoking Status Never Smoker  Smokeless Tobacco Never Used     Counseling given: No   Clinical Intake:  Pre-visit preparation completed: Yes  Pain : No/denies pain Pain Score: 0-No pain     Nutritional Status: BMI > 30  Obese Nutritional Risks: None Diabetes: Yes CBG done?: No Did pt. bring in CBG monitor from home?: No  How often do you need to have someone help you when you read instructions, pamphlets, or other written materials from your doctor or pharmacy?: 1 - Never What is the last grade level you completed in school?: Master degree  Interpreter Needed?: No  Comments: pt lives with spouse Information entered by :: LPinson, LPN  Past Medical History:  Diagnosis Date  . Allergic rhinitis   . Arthritis   . 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 nephropathy ([place])    DSME at [facility] 01/2016   . GERD (gastroesophageal reflux disease)   . Hepatic steatosis    by abd [place] 05/2012, mild transaminitis - normal iron sat and viral hep panel (2011), stable [place] 2017  . History of chicken pox   . History of measles   . History of recurrent UTIs    on chronic keflex  . HLD (hyperlipidemia)   . HTN (hypertension)   . 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 Kidney cyst, acquired 01/2016   L kidney by [place]  . Kidney stone 01/2016   L kidney by [place]  . Lung nodules 11/2013   overall stable on f/u CT 01/2016  . Osteopenia 06/2013   mild, forearm T -1.1, hip and spine WNL  . Polycythemia    mild, stable (2013)  . Rosacea    metrogel   Past Surgical History:  Procedure Laterality Date  . APPENDECTOMY  1987  . BREAST BIOPSY Right 1963   benign  . BREAST BIOPSY Right 08/2014   benign- core  . cardiolite stress test  04/2004   normal  . CESAREAN SECTION  [id number];[id number]   x2  . CHOLECYSTECTOMY  2003  . COLONOSCOPY  09/26/2008   adenomatous polyp, rpt 3-5 yrs  . COLONOSCOPY  08/2012   adenomatous polyps, diverticulosis, rec rpt 5 yrs [name])  . COLONOSCOPY WITH PROPOFOL N/A 02/05/2018   4TA, SSA, diverticulosis, rpt 3 yrs [name], [name], MD)  . dexa  2003   normal  . dexa  06/2013   [facility] - Tscore -1.1 forearm, normal spine and femur  . TRIGGER FINGER RELEASE  2007;2010;2011   bilateral  . TRIGGER FINGER RELEASE  02/2017  . VAGINAL HYSTERECTOMY  1984   for menorrhagia, ovaries in place   Family History  Problem Relation Age of Onset  . Stroke Mother        several  . Hyperlipidemia Mother   . Hypertension Mother   . Cancer Father        colon  . Hypertension Father   . Hyperlipidemia Father   . Coronary artery disease Father 33       MIx1, CABG  . Cancer Paternal Aunt        abdominal  .  Coronary artery disease Maternal Grandmother   . Diabetes Maternal Grandfather   . Coronary artery disease Maternal Grandfather   . Breast cancer Neg Hx    Social History   Socioeconomic History  . Marital status: Married    Spouse name: Not on file  . Number of children: Not on file  . Years of education: Not on file  . Highest education level: Not on file  Occupational History  . Not on file  Social Needs  . Financial resource strain: Not on file  . Food insecurity:    Worry: Not on file    Inability: Not on file  . Transportation needs:    Medical: Not on file    Non-medical: Not on file  Tobacco Use  . Smoking status: Never Smoker  . Smokeless tobacco: Never Used  Substance and Sexual Activity  . Alcohol use: Yes    Alcohol/week: 1.0 - 2.0 standard drinks    Types: 1 - 2 Glasses of wine per week    Comment: Occasional/1 mixed drink per month  . Drug use: No  . Sexual activity: Yes  Lifestyle  . Physical activity:    Days per week: Not on file    Minutes per session: Not on file  . Stress: Not on file  Relationships  . Social connections:    Talks on phone: Not on file    Gets together: Not on file    Attends religious service: Not on file    Active member of club or organization: Not on file    Attends meetings of clubs or organizations: Not on file    Relationship status: Not on file  Other Topics Concern  . 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 Daily walking about 20 min.    Diet: ok water intake 4 glasses/day, daily fruits/vegetables, red meat 4x/wk, fish 3-4x/wk    Outpatient Encounter Medications as of 08/16/2018  Medication Sig  . acetaminophen (TYLENOL) 500 MG tablet Take 500 mg by mouth every 6 (six) hours as needed.  Marland Kitchen  amLODipine (NORVASC) 10 MG tablet TAKE 1 TABLET BY MOUTH  DAILY  . aspirin 81 MG tablet Take 81 mg by mouth daily.  . benazepril (LOTENSIN) 20 MG tablet TAKE 1 TABLET BY MOUTH  DAILY  .  Blood Glucose Monitoring Suppl (ACCU-CHEK AVIVA) device Use as instructed to check sugars once daily E11.21  . Calcium Carbonate-Vitamin D (CALCIUM 500/VITAMIN D PO) Take 2 tablets by mouth daily.  . diphenhydrAMINE (BENADRYL) 25 MG tablet Take 25 mg by mouth at bedtime as needed.  . fenofibrate 160 MG tablet TAKE 1 TABLET BY MOUTH  DAILY  . fluticasone (FLONASE) 50 MCG/ACT nasal spray USE 2 SPRAYS INTO BOTH  NOSTRILS DAILY AS NEEDED  . glucose blood (ACCU-CHEK AVIVA) test strip Use as directed to check sugars once daily E11.21  . hydrochlorothiazide (MICROZIDE) 12.5 MG capsule TAKE 1 CAPSULE BY MOUTH  DAILY  . Krill Oil Omega-3 500 MG CAPS Take 1 capsule by mouth daily.  . Lancets (ACCU-CHEK SOFT TOUCH) lancets Use as instructed to check sugars once daily E11.21  . metFORMIN (GLUCOPHAGE XR) 500 MG 24 hr tablet Take 1 tablet (500 mg total) by mouth daily with breakfast.  . Multiple Vitamin (MULTIVITAMIN) tablet Take 1 tablet by mouth daily.  . rosuvastatin (CRESTOR) 5 MG tablet TAKE 1 TABLET BY MOUTH AT  BEDTIME   No facility-administered encounter medications on file as of 08/16/2018.     Activities of Daily Living In your present state of health, do you have any difficulty performing the following activities: 08/16/2018  Hearing? N  Vision? N  Difficulty concentrating or making decisions? N  Walking or climbing stairs? N  Dressing or bathing? N  Doing errands, shopping? N  Preparing Food and eating ? N  Using the Toilet? N  In the past six months, have you accidently leaked urine? N  Do you have problems with loss of bowel control? N  Managing your Medications? N  Managing your Finances? N  Housekeeping or managing your Housekeeping? N  Some recent data might be hidden    Patient Care  Team: [name], MD as PCP - General (Family Medicine) [name], MD as Consulting Physician (Orthopedic Surgery) Ralene Bathe, MD as Consulting Physician (Dermatology) Johnnette Litter, MD as Consulting Physician (Dentistry) Thelma Comp, [place] as Consulting Physician (Optometry)    Assessment:   This is a routine wellness examination for Katherine Oliver.   Hearing Screening   125Hz  250Hz  500Hz  1000Hz  2000Hz  3000Hz  4000Hz  6000Hz  8000Hz   Right ear:   40 40 40  40    Left ear:   40 40 40  40    Vision Screening Comments: Vision exam in Sept 2019 with Dr. Thelma Comp    Exercise Activities and Dietary recommendations Current Exercise Habits: Home exercise routine, Type of exercise: walking, Time (Minutes): 60, Frequency (Times/Week): 3, Weekly Exercise (Minutes/Week): 180, Intensity: Mild, Exercise limited by: None identified  Goals    . Increase physical activity     Starting 08/16/2018, I will continue to exercise for 60 minutes 3 days per week.        Fall Risk Fall Risk  08/16/2018 08/10/2017 07/14/2016 07/05/2015 06/30/2014  Falls in the past year? No No No No No   Depression Screen PHQ 2/9 Scores 08/16/2018 08/10/2017 07/14/2016 07/05/2015  PHQ - 2 Score 0 0 0 0  PHQ- 9 Score 0 0 - -     Cognitive Function MMSE - Mini Mental State Exam 08/16/2018 08/10/2017 07/14/2016  Orientation to time 5 5 5   Orientation to Place 5 5 5   Registration 3 3 3   Attention/ Calculation 0 0 0  Recall 3 3 3   Language- name 2 objects 0 0 0  Language- repeat 1 1 1  Language- follow 3 step command 3 3 3   Language- read & follow direction 0 0 0  Write a sentence 0 0 0  Copy design 0 0 0  Total score 20 20 20        PLEASE NOTE: A Mini-Cog screen was completed. Maximum score is 20. A value of 0 denotes this part of [place] MMSE was not completed or the patient failed this part of the Mini-Cog screening.   Mini-Cog Screening Orientation to Time - Max 5 pts Orientation to Place -  Max 5 pts Registration - Max 3 pts Recall - Max 3 pts Language Repeat - Max 1 pts Language Follow 3 Step Command - Max 3 pts   Immunization History  Administered Date(s) Administered  . Influenza, High Dose Seasonal PF 08/10/2017, 08/16/2018  . Influenza,inj,Quad PF,6+ Mos 08/02/2014, 07/05/2015, 07/14/2016  . Influenza-Unspecified 08/03/2013  . Pneumococcal Conjugate-13 06/30/2014  . Pneumococcal Polysaccharide-23 11/03/2002, 09/20/2007, 05/31/2012  . Td 01/20/2005, 08/10/2017  . Zoster 11/03/2008    Screening Tests Health Maintenance  Topic Date Due  . DTaP/Tdap/Td (1 - Tdap) 08/11/2027 (Originally 08/11/2017)  . MAMMOGRAM  08/25/2018  . HEMOGLOBIN A1C  11/19/2018  . FOOT EXAM  05/20/2019  . OPHTHALMOLOGY EXAM  07/09/2019  . COLONOSCOPY  02/06/2023  . TETANUS/TDAP  08/11/2027  . INFLUENZA VACCINE  Completed  . DEXA SCAN  Completed  . Hepatitis C Screening  Completed  . PNA vac Low Risk Adult  Completed      Plan:     I have personally reviewed, addressed, and noted the following in the patient's chart:  A. Medical and social history B. Use of alcohol, tobacco or illicit drugs  C. Current medications and supplements D. Functional ability and status E.  Nutritional status F.  Physical activity G. Advance directives H. List of other physicians I.  Hospitalizations, surgeries, and ER visits in previous 12 months J.  [place] to include hearing, vision, cognitive, depression L. Referrals and appointments - none  In addition, I have reviewed and discussed with patient certain preventive protocols, quality metrics, and best practice recommendations. A written personalized care plan for preventive services as well as general preventive health recommendations were provided to patient.  See attached scanned questionnaire for additional information.   Signed,   Lindell Noe, MHA, BS, LPN Health Coach

## 2018-08-16 NOTE — Progress Notes (Signed)
PCP notes:   Health maintenance:  Flu vaccine - administered  Abnormal screenings:   None  Patient concerns:   None  Nurse concerns:  None  Next PCP appt:   08/19/18 @ 1030

## 2018-08-16 NOTE — Patient Instructions (Signed)
Katherine Oliver , Thank you for taking time to come for your Medicare Wellness Visit. I appreciate your ongoing commitment to your health goals. Please review the following plan we discussed and let me know if I can assist you in the future.   These are the goals we discussed: Goals    . Increase physical activity     Starting 08/16/2018, I will continue to exercise for 60 minutes 3 days per week.        This is a list of the screening recommended for you and due dates:  Health Maintenance  Topic Date Due  . DTaP/Tdap/Td vaccine (1 - Tdap) 08/11/2027*  . Mammogram  08/25/2018  . Hemoglobin A1C  11/19/2018  . Complete foot exam   05/20/2019  . Eye exam for diabetics  07/09/2019  . Colon Cancer Screening  02/06/2023  . Tetanus Vaccine  08/11/2027  . Flu Shot  Completed  . DEXA scan (bone density measurement)  Completed  .  Hepatitis C: One time screening is recommended by Center for Disease Control  (CDC) for  adults born from [birth year] through 1967.   Completed  . Pneumonia vaccines  Completed  *Topic was postponed. The date shown is not the original due date.   Preventive Care for Adults  A healthy lifestyle and preventive care can promote health and wellness. Preventive health guidelines for adults include the following key practices.  . A routine yearly physical is a good way to check with your health care provider about your health and preventive screening. It is a chance to share any concerns and updates on your health and to receive a thorough exam.  . Visit your dentist for a routine exam and preventive care every 6 months. Brush your teeth twice a day and floss once a day. Good oral hygiene prevents tooth decay and gum disease.  . The frequency of eye exams is based on your age, health, family medical history, use  of contact lenses, and other factors. Follow your health care provider's recommendations for frequency of eye exams.  . Eat a healthy diet. Foods like vegetables,  fruits, whole grains, low-fat dairy products, and lean protein foods contain the nutrients you need without too many calories. Decrease your intake of foods high in solid fats, added sugars, and salt. Eat the right amount of calories for you. Get information about a proper diet from your health care provider, if necessary.  . Regular physical exercise is one of the most important things you can do for your health. Most adults should get at least 150 minutes of moderate-intensity exercise (any activity that increases your heart rate and causes you to sweat) each week. In addition, most adults need muscle-strengthening exercises on 2 or more days a week.  Silver Sneakers may be a benefit available to you. To determine eligibility, you may visit the website: www.silversneakers.com or contact program at (509)838-3054 Mon-Fri between 8AM-8PM.   . Maintain a healthy weight. The body mass index (BMI) is a screening tool to identify possible weight problems. It provides an estimate of body fat based on height and weight. Your health care provider can find your BMI and can help you achieve or maintain a healthy weight.   For adults 20 years and older: ? A BMI below 18.5 is considered underweight. ? A BMI of 18.5 to 24.9 is normal. ? A BMI of 25 to 29.9 is considered overweight. ? A BMI of 30 and above is considered obese.   Marland Kitchen  Maintain normal blood lipids and cholesterol levels by exercising and minimizing your intake of saturated fat. Eat a balanced diet with plenty of fruit and vegetables. Blood tests for lipids and cholesterol should begin at age 45 and be repeated every 5 years. If your lipid or cholesterol levels are high, you are over 50, or you are at high risk for heart disease, you may need your cholesterol levels checked more frequently. Ongoing high lipid and cholesterol levels should be treated with medicines if diet and exercise are not working.  . If you smoke, find out from your health care  provider how to quit. If you do not use tobacco, please do not start.  . If you choose to drink alcohol, please do not consume more than 2 drinks per day. One drink is considered to be 12 ounces (355 mL) of beer, 5 ounces (148 mL) of wine, or 1.5 ounces (44 mL) of liquor.  . If you are 35-42 years old, ask your health care provider if you should take aspirin to prevent strokes.  . Use sunscreen. Apply sunscreen liberally and repeatedly throughout the day. You should seek shade when your shadow is shorter than you. Protect yourself by wearing long sleeves, pants, a wide-brimmed hat, and sunglasses year round, whenever you are outdoors.  . Once a month, do a whole body skin exam, using a mirror to look at the skin on your back. Tell your health care provider of new moles, moles that have irregular borders, moles that are larger than a pencil eraser, or moles that have changed in shape or color.

## 2018-08-19 ENCOUNTER — Ambulatory Visit (INDEPENDENT_AMBULATORY_CARE_PROVIDER_SITE_OTHER): Payer: Medicare Other | Admitting: Family Medicine

## 2018-08-19 ENCOUNTER — Encounter: Payer: Self-pay | Admitting: Family Medicine

## 2018-08-19 VITALS — BP 124/70 | HR 89 | Temp 97.8°F | Ht 61.0 in | Wt 180.2 lb

## 2018-08-19 DIAGNOSIS — D751 Secondary polycythemia: Secondary | ICD-10-CM | POA: Diagnosis not present

## 2018-08-19 DIAGNOSIS — E1122 Type 2 diabetes mellitus with diabetic chronic kidney disease: Secondary | ICD-10-CM

## 2018-08-19 DIAGNOSIS — N183 Chronic kidney disease, stage 3 unspecified: Secondary | ICD-10-CM

## 2018-08-19 DIAGNOSIS — K760 Fatty (change of) liver, not elsewhere classified: Secondary | ICD-10-CM | POA: Diagnosis not present

## 2018-08-19 DIAGNOSIS — Z7189 Other specified counseling: Secondary | ICD-10-CM

## 2018-08-19 DIAGNOSIS — J309 Allergic rhinitis, unspecified: Secondary | ICD-10-CM | POA: Diagnosis not present

## 2018-08-19 DIAGNOSIS — E66811 Obesity, class 1: Secondary | ICD-10-CM

## 2018-08-19 DIAGNOSIS — I10 Essential (primary) hypertension: Secondary | ICD-10-CM

## 2018-08-19 DIAGNOSIS — E1121 Type 2 diabetes mellitus with diabetic nephropathy: Secondary | ICD-10-CM | POA: Diagnosis not present

## 2018-08-19 DIAGNOSIS — E782 Mixed hyperlipidemia: Secondary | ICD-10-CM

## 2018-08-19 DIAGNOSIS — E669 Obesity, unspecified: Secondary | ICD-10-CM | POA: Diagnosis not present

## 2018-08-19 MED ORDER — HYDROCHLOROTHIAZIDE 12.5 MG PO CAPS: 12.5000 mg | ORAL_CAPSULE | Freq: Every day | ORAL | 3 refills | Status: DC

## 2018-08-19 MED ORDER — METFORMIN HCL ER 500 MG PO TB24: 500.0000 mg | ORAL_TABLET | Freq: Every day | ORAL | 3 refills | Status: DC

## 2018-08-19 MED ORDER — BENAZEPRIL HCL 20 MG PO TABS: 20.0000 mg | ORAL_TABLET | Freq: Every day | ORAL | 3 refills | Status: DC

## 2018-08-19 MED ORDER — ROSUVASTATIN CALCIUM 5 MG PO TABS: 5.0000 mg | ORAL_TABLET | Freq: Every day | ORAL | 3 refills | Status: DC

## 2018-08-19 MED ORDER — FENOFIBRATE 160 MG PO TABS: 160.0000 mg | ORAL_TABLET | Freq: Every day | ORAL | 3 refills | Status: DC

## 2018-08-19 MED ORDER — ROSUVASTATIN CALCIUM 10 MG PO TABS: 10.0000 mg | ORAL_TABLET | Freq: Every day | ORAL | 3 refills | Status: DC

## 2018-08-19 MED ORDER — AMLODIPINE BESYLATE 10 MG PO TABS: 10.0000 mg | ORAL_TABLET | Freq: Every day | ORAL | 3 refills | Status: DC

## 2018-08-19 MED ORDER — FLUTICASONE PROPIONATE 50 MCG/ACT NA SUSP: NASAL | 3 refills | Status: DC

## 2018-08-19 NOTE — Assessment & Plan Note (Signed)
Chronic, stable. Continue current regimen.  Tolerating metformin XR better.

## 2018-08-19 NOTE — Assessment & Plan Note (Signed)
Regularly uses flonase and benadryl.

## 2018-08-19 NOTE — Patient Instructions (Addendum)
If interested, check with pharmacy about new 2 shot shingles series (shingrix).  Check on cost of vascepa or lovaza with insurance. Increase crestor to 10mg  daily - new dose at pharmacy.  Return as needed or in 6 months for follow up visit.  Good to see you today, call us with questions.

## 2018-08-19 NOTE — Assessment & Plan Note (Signed)
Advanced directives: husband [name] is [place]. DPOA in chart (05/2014). Needs to work on this - has at home.

## 2018-08-19 NOTE — Progress Notes (Signed)
BP 124/70 (BP Location: Left Arm, Patient Position: Sitting, Cuff Size: Normal)   Pulse 89   Temp 97.8 F (36.6 C) (Oral)   Ht 5\' 1"  (1.549 m)   Wt 180 lb 4 oz (81.8 kg)   SpO2 97%   BMI 34.06 kg/m    CC: AMW f/u visit Subjective:    Patient ID: Katherine Oliver, female    DOB: 09/03/1946, 72 y.o.   MRN: [id number]  HPI: Jeroline Wolbert is a 72 y.o. female presenting on 08/19/2018 for Annual Exam (Pt 2.)   Saw Katha Cabal last week for medicare wellness visit. Note reviewed.  Brings log of fasting sugars 110-140s.   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 mammo 08/2015 and 08/2016 and 08/2017 WNL Pap smear - stopped around 72yo, s/p hysterectomy for heavy bleeding. Both ovaries remain. Declines continued pelvic exam.  Dexa Date: 06/2013 [facility] - Tscore -1.1 forearm, normal spine and femur Flu shot yearly Pneumovax 05/2012, prevnar 2015 Td 2006  Zostavax 2010  Shingrix - discussed  Advanced directives: husband [name] is [place]. DPOA in chart (05/2014). Needs to work on this - has at home.  Seat belt use discussed  Sunscreen use discussed. No changing moles on skin. 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 Walking 30 min several times a week.  Diet: ok water intake 4 glasses/day, daily fruits/vegetables, red meat 4x/wk, fish 3-4x/wk   Relevant past medical, surgical, family and social history reviewed and updated as indicated. Interim medical history since our last visit reviewed. Allergies and medications reviewed and updated. Outpatient Medications Prior to  Visit  Medication Sig Dispense Refill  . acetaminophen (TYLENOL) 500 MG tablet Take 500 mg by mouth every 6 (six) hours as needed.    Marland Kitchen aspirin 81 MG tablet Take 81 mg by mouth daily.    . Blood Glucose Monitoring Suppl (ACCU-CHEK AVIVA) device Use as instructed to check sugars once daily E11.21 1 each 0  . Calcium Carbonate-Vitamin D (CALCIUM 500/VITAMIN D PO) Take 2 tablets by mouth daily.    . diphenhydrAMINE (BENADRYL) 25 MG tablet Take 25 mg by mouth at bedtime as needed.    Marland Kitchen glucose blood (ACCU-CHEK AVIVA) test strip Use as directed to check sugars once daily E11.21 100 each 3  . Krill Oil Omega-3 500 MG CAPS Take 1 capsule by mouth daily.    . Lancets (ACCU-CHEK SOFT TOUCH) lancets Use as instructed to check sugars once daily E11.21 100 each 3  . Multiple Vitamin (MULTIVITAMIN) tablet Take 1 tablet by mouth daily.    Marland Kitchen amLODipine (NORVASC) 10 MG tablet TAKE 1 TABLET BY MOUTH  DAILY 90 tablet 0  . benazepril (LOTENSIN) 20 MG tablet TAKE 1 TABLET BY MOUTH  DAILY 90 tablet 0  . fenofibrate 160 MG tablet TAKE 1 TABLET BY MOUTH  DAILY 90 tablet 0  . fluticasone (FLONASE) 50 MCG/ACT nasal spray USE 2 SPRAYS INTO BOTH  NOSTRILS DAILY AS NEEDED 48 g 3  . hydrochlorothiazide (MICROZIDE) 12.5 MG capsule TAKE 1 CAPSULE BY MOUTH  DAILY 90 capsule 0  . metFORMIN (GLUCOPHAGE XR) 500 MG  24 hr tablet Take 1 tablet (500 mg total) by mouth daily with breakfast. 90 tablet 3  . rosuvastatin (CRESTOR) 5 MG tablet TAKE 1 TABLET BY MOUTH AT  BEDTIME 90 tablet 0   No facility-administered medications prior to visit.      Per HPI unless specifically indicated in ROS section below Review of Systems     Objective:    BP 124/70 (BP Location: Left Arm, Patient Position: Sitting, Cuff Size: Normal)   Pulse 89   Temp 97.8 F (36.6 C) (Oral)   Ht 5\' 1"  (1.549 m)   Wt 180 lb 4 oz (81.8 kg)   SpO2 97%   BMI 34.06 kg/m   Wt Readings from Last 3 Encounters:  08/19/18 180 lb 4 oz (81.8 kg)  08/16/18 178 lb  8 oz (81 kg)  05/19/18 178 lb 12 oz (81.1 kg)    Physical Exam  Constitutional: She is oriented to person, place, and time. She appears well-developed and well-nourished. No distress.  HENT:  Head: Normocephalic and atraumatic.  Right Ear: Hearing, tympanic membrane, external ear and ear canal normal.  Left Ear: Hearing, tympanic membrane, external ear and ear canal normal.  Nose: Nose normal.  Mouth/Throat: Uvula is midline, oropharynx is clear and moist and mucous membranes are normal. No oropharyngeal exudate, posterior oropharyngeal edema or posterior oropharyngeal erythema.  Eyes: Pupils are equal, round, and reactive to light. Conjunctivae and EOM are normal. No scleral icterus.  Neck: Normal range of motion. Neck supple.  Cardiovascular: Normal rate, regular rhythm, normal heart sounds and intact distal pulses.  No murmur heard. Pulses:      Radial pulses are 2+ on the right side, and 2+ on the left side.  Pulmonary/Chest: Effort normal and breath sounds normal. No respiratory distress. She has no wheezes. She has no rales.  Abdominal: Soft. Bowel sounds are normal. She exhibits no distension and no mass. There is no tenderness. There is no rebound and no guarding.  Musculoskeletal: Normal range of motion. She exhibits no edema.  Lymphadenopathy:    She has no cervical adenopathy.  Neurological: She is alert and oriented to person, place, and time.  CN grossly intact, station and gait intact  Skin: Skin is warm and dry. No rash noted.  Psychiatric: She has a normal mood and affect. Her behavior is normal. Judgment and thought content normal.  Nursing note and vitals reviewed.  Results for orders placed or performed in visit on 08/16/18  VITAMIN D 25 Hydroxy (Vit-D Deficiency, Fractures)  Result Value Ref Range   VITD 30.63 30.00 - 100.00 ng/mL  CBC with Differential/Platelet  Result Value Ref Range   WBC 5.1 4.0 - 10.5 K/uL   RBC 5.46 (H) 3.87 - 5.11 Mil/uL   Hemoglobin  15.1 (H) 12.0 - 15.0 g/dL   HCT 45.7 36.0 - 46.0 %   MCV 83.8 78.0 - 100.0 fl   MCHC 32.9 30.0 - 36.0 g/dL   RDW 14.2 11.5 - 15.5 %   Platelets 233.0 150.0 - 400.0 K/uL   Neutrophils Relative % 63.3 43.0 - 77.0 %   Lymphocytes Relative 27.3 12.0 - 46.0 %   Monocytes Relative 8.8 3.0 - 12.0 %   Eosinophils Relative 0.0 0.0 - 5.0 %   Basophils Relative 0.6 0.0 - 3.0 %   Neutro Abs 3.2 1.4 - 7.7 K/uL   Lymphs Abs 1.4 0.7 - 4.0 K/uL   Monocytes Absolute 0.5 0.1 - 1.0 K/uL   Eosinophils Absolute 0.0 0.0 -  0.7 K/uL   Basophils Absolute 0.0 0.0 - 0.1 K/uL  Hemoglobin A1c  Result Value Ref Range   Hgb A1c MFr Bld 6.5 4.6 - 6.5 %  Comprehensive metabolic panel  Result Value Ref Range   Sodium 139 135 - 145 mEq/L   Potassium 4.3 3.5 - 5.1 mEq/L   Chloride 102 96 - 112 mEq/L   CO2 30 19 - 32 mEq/L   Glucose, Bld 140 (H) 70 - 99 mg/dL   BUN 26 (H) 6 - 23 mg/dL   Creatinine, Ser 1.04 0.40 - 1.20 mg/dL   Total Bilirubin 0.4 0.2 - 1.2 mg/dL   Alkaline Phosphatase 43 39 - 117 U/L   AST 21 0 - 37 U/L   ALT 40 (H) 0 - 35 U/L   Total Protein 7.4 6.0 - 8.3 g/dL   Albumin 4.6 3.5 - 5.2 g/dL   Calcium 10.3 8.4 - 10.5 mg/dL   GFR 55.05 (L) >60.00 mL/min  Lipid panel  Result Value Ref Range   Cholesterol 155 0 - 200 mg/dL   Triglycerides 228.0 (H) 0.0 - 149.0 mg/dL   HDL 43.70 >39.00 mg/dL   VLDL 45.6 (H) 0.0 - 40.0 mg/dL   Total CHOL/HDL Ratio 4    NonHDL 111.21   LDL cholesterol, direct  Result Value Ref Range   Direct LDL 92.0 mg/dL      Assessment & Plan:   Problem List Items Addressed This Visit    Polycythemia    Minimal - will continue to monitor.       Obesity, Class I, BMI 30-34.9    Encouraged healthy diet and lifestyle changes to affect sustainable weight loss.       NAFLD (nonalcoholic fatty liver disease)    Stable period. Continue to monitor. Mildly elevated ALT.       HTN (hypertension)    Chronic, stable. Continue current regimen.       Relevant Medications    amLODipine (NORVASC) 10 MG tablet   benazepril (LOTENSIN) 20 MG tablet   fenofibrate 160 MG tablet   hydrochlorothiazide (MICROZIDE) 12.5 MG capsule   rosuvastatin (CRESTOR) 10 MG tablet   HLD (hyperlipidemia) - Primary    Chronic, trig not at goal - will increase crestor to 10mg  daily. Continue high dose fenofibrate and krill oil. lovaza previously unaffordable.       Relevant Medications   amLODipine (NORVASC) 10 MG tablet   benazepril (LOTENSIN) 20 MG tablet   fenofibrate 160 MG tablet   hydrochlorothiazide (MICROZIDE) 12.5 MG capsule   rosuvastatin (CRESTOR) 10 MG tablet   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 nephropathy (HCC)    Chronic, stable. Continue current regimen.  Tolerating metformin XR better.       Relevant Medications   benazepril (LOTENSIN) 20 MG tablet   metFORMIN (GLUCOPHAGE XR) 500 MG 24 hr tablet   rosuvastatin (CRESTOR) 10 MG tablet   CKD stage 3 due to type 2 diabetes mellitus ([place])    Stable period - continue to monitor.       Relevant Medications   benazepril (LOTENSIN) 20 MG tablet   metFORMIN (GLUCOPHAGE XR) 500 MG 24 hr tablet   rosuvastatin (CRESTOR) 10 MG tablet   Allergic rhinitis    Regularly uses flonase and benadryl.      Advanced care planning/counseling discussion    Advanced directives: husband [name] is [place]. DPOA in chart (05/2014). Needs to work on this - has at home.  Meds ordered this encounter  Medications  . amLODipine (NORVASC) 10 MG tablet    Sig: Take 1 tablet (10 mg total) by mouth daily.    Dispense:  90 tablet    Refill:  3  . benazepril (LOTENSIN) 20 MG tablet    Sig: Take 1 tablet (20 mg total) by mouth daily.    Dispense:  90 tablet    Refill:  3  . fluticasone (FLONASE) 50 MCG/ACT nasal spray    Sig: USE 2 SPRAYS INTO BOTH  NOSTRILS DAILY AS NEEDED    Dispense:  48 g    Refill:  3  . fenofibrate 160 MG tablet    Sig: Take 1 tablet (160 mg total) by mouth daily.    Dispense:  90  tablet    Refill:  3  . metFORMIN (GLUCOPHAGE XR) 500 MG 24 hr tablet    Sig: Take 1 tablet (500 mg total) by mouth daily with breakfast.    Dispense:  90 tablet    Refill:  3  . hydrochlorothiazide (MICROZIDE) 12.5 MG capsule    Sig: Take 1 capsule (12.5 mg total) by mouth daily.    Dispense:  90 capsule    Refill:  3  . DISCONTD: rosuvastatin (CRESTOR) 5 MG tablet    Sig: Take 1 tablet (5 mg total) by mouth at bedtime.    Dispense:  90 tablet    Refill:  3  . rosuvastatin (CRESTOR) 10 MG tablet    Sig: Take 1 tablet (10 mg total) by mouth at bedtime.    Dispense:  90 tablet    Refill:  3    Note new sig   No orders of the defined types were placed in this encounter.   Follow up plan: Return in about 6 months (around 02/18/2019) for follow up visit.  [name], MD

## 2018-08-19 NOTE — Assessment & Plan Note (Signed)
Chronic, stable. Continue current regimen. 

## 2018-08-19 NOTE — Assessment & Plan Note (Signed)
Chronic, trig not at goal - will increase crestor to 10mg  daily. Continue high dose fenofibrate and krill oil. lovaza previously unaffordable.

## 2018-08-19 NOTE — Assessment & Plan Note (Signed)
Minimal - will continue to monitor.

## 2018-08-19 NOTE — Assessment & Plan Note (Signed)
Stable period. Continue to monitor. Mildly elevated ALT.

## 2018-08-19 NOTE — Assessment & Plan Note (Signed)
Stable period - continue to monitor. 

## 2018-08-19 NOTE — Assessment & Plan Note (Signed)
Encouraged healthy diet and lifestyle changes to affect sustainable weight loss.  

## 2018-08-26 ENCOUNTER — Ambulatory Visit
Admission: RE | Admit: 2018-08-26 | Discharge: 2018-08-26 | Disposition: A | Discharge status: Home / Self Care | Payer: Medicare Other | Source: Ambulatory Visit | Attending: Family Medicine | Admitting: Family Medicine

## 2018-08-26 DIAGNOSIS — Z1231 Encounter for screening mammogram for malignant neoplasm of breast: Secondary | ICD-10-CM | POA: Diagnosis not present

## 2018-08-26 LAB — HM MAMMOGRAPHY

## 2018-08-31 ENCOUNTER — Encounter: Payer: Self-pay | Admitting: Family Medicine

## 2018-09-11 ENCOUNTER — Other Ambulatory Visit: Payer: Self-pay | Admitting: Family Medicine

## 2018-09-11 NOTE — Progress Notes (Signed)
I reviewed health advisor's note, was available for consultation, and agree with documentation and plan.  

## 2018-09-21 ENCOUNTER — Other Ambulatory Visit: Payer: Self-pay

## 2018-09-23 ENCOUNTER — Encounter: Payer: Self-pay | Admitting: Emergency Medicine

## 2018-09-23 ENCOUNTER — Emergency Department
Admission: EM | Admit: 2018-09-23 | Discharge: 2018-09-23 | Disposition: A | Discharge status: Home / Self Care | Payer: Medicare Other | Attending: Emergency Medicine | Admitting: Emergency Medicine

## 2018-09-23 ENCOUNTER — Other Ambulatory Visit: Payer: Self-pay

## 2018-09-23 DIAGNOSIS — I129 Hypertensive chronic kidney disease with stage 1 through stage 4 chronic kidney disease, or unspecified chronic kidney disease: Secondary | ICD-10-CM | POA: Diagnosis not present

## 2018-09-23 DIAGNOSIS — R040 Epistaxis: Secondary | ICD-10-CM | POA: Diagnosis not present

## 2018-09-23 DIAGNOSIS — Z7982 Long term (current) use of aspirin: Secondary | ICD-10-CM | POA: Diagnosis not present

## 2018-09-23 DIAGNOSIS — N183 Chronic kidney disease, stage 3 (moderate): Secondary | ICD-10-CM | POA: Diagnosis not present

## 2018-09-23 DIAGNOSIS — E1122 Type 2 diabetes mellitus with diabetic chronic kidney disease: Secondary | ICD-10-CM | POA: Insufficient documentation

## 2018-09-23 DIAGNOSIS — Z7984 Long term (current) use of oral hypoglycemic drugs: Secondary | ICD-10-CM | POA: Insufficient documentation

## 2018-09-23 DIAGNOSIS — Z79899 Other long term (current) drug therapy: Secondary | ICD-10-CM | POA: Diagnosis not present

## 2018-09-23 MED ORDER — OXYMETAZOLINE HCL 0.05 % NA SOLN
1.0000 | Freq: Once | NASAL | Status: AC
Start: 2018-09-23 — End: 2018-09-23
  Administered 2018-09-23: 1 via NASAL
  Filled 2018-09-23: qty 15

## 2018-09-23 NOTE — ED Triage Notes (Signed)
Pt presents to ED via POV with c/o nosebleed that started this morning. Currently bleeding controlled with cotton packing applied by patient PTA. Pt is alert and oriented at this time.

## 2018-09-23 NOTE — ED Provider Notes (Signed)
[facility] Emergency Department Provider Note   ____________________________________________   First MD Initiated Contact with Patient 09/23/18 1143     (approximate)  I have reviewed the triage vital signs and the nursing notes.   HISTORY  Chief Complaint Epistaxis    HPI Katherine Oliver is a 72 y.o. female patient presents with second episode of epistaxis this week.  First episode was 2 days ago after sneezing event..  Bleeding resolved in approximately approximately 5 minutes.  Patient states second episode prior to arrival by any provocative incident.  Again patient states bleeding was resolved with a cotton ball.  Patient states nasal spotting at this time.  Patient denies headache associated this complaint.  Patient denies vertigo, weakness, or vision disturbance.   Past Medical History:  Diagnosis Date  . Allergic rhinitis   . Arthritis   . 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 nephropathy ([place])    DSME at [facility] 01/2016   . GERD (gastroesophageal reflux disease)   . Hepatic steatosis    by abd [place] 05/2012, mild transaminitis - normal iron sat and viral hep panel (2011), stable [place] 2017  . History of chicken pox   . History of measles   . History of recurrent UTIs    on chronic keflex  . HLD (hyperlipidemia)   . HTN (hypertension)   . 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 Kidney cyst, acquired 01/2016   L kidney by [place]  . Kidney stone 01/2016   L kidney by [place]  . Lung nodules 11/2013   overall stable on f/u CT 01/2016  . Osteopenia 06/2013   mild, forearm T -1.1, hip and spine WNL  . Polycythemia    mild, stable (2013)  . Rosacea    metrogel    Patient Active Problem List   Diagnosis Date Noted  . Acute cystitis without hematuria 02/12/2018  . Vulvar dermatitis  12/07/2017  . Stress due to illness of family member 08/18/2017  . Right hip pain 08/07/2016  . CKD stage 3 due to type 2 diabetes mellitus ([place]) 04/04/2016  . Trigger ring finger of right hand 04/04/2016  . Pulmonary nodules 01/03/2016  . Advanced care planning/counseling discussion 07/05/2015  . Obesity, Class I, BMI 30-34.9 07/05/2015  . 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 Osteopenia 06/03/2013  . Medicare annual wellness visit, subsequent 05/31/2012  . Rotator cuff tendonitis, right 03/01/2012  . Polycythemia 12/02/2011  . 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 nephropathy ([place])   . HTN (hypertension)   . HLD (hyperlipidemia)   . History of recurrent UTIs   . Allergic rhinitis   . GERD (gastroesophageal reflux disease)   . Rosacea   . NAFLD (nonalcoholic fatty liver disease) 06/03/2010    Past Surgical History:  Procedure Laterality Date  . APPENDECTOMY  1987  . BREAST BIOPSY Right 1963   benign  . BREAST BIOPSY Right 08/2014   benign- core  . cardiolite stress test  04/2004   normal  . CESAREAN SECTION  [id number]   x2  . CHOLECYSTECTOMY  2003  . COLONOSCOPY  09/26/2008   adenomatous polyp, rpt 3-5 yrs  . COLONOSCOPY  08/2012   adenomatous polyps, diverticulosis, rec rpt 5 yrs [name])  . 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 dexa  2003   normal  . dexa  06/2013   [facility] - Tscore -1.1 forearm, normal spine and femur  . TRIGGER FINGER RELEASE  2007;2010;2011   bilateral  . TRIGGER FINGER RELEASE  02/2017  . VAGINAL HYSTERECTOMY  1984   for menorrhagia, ovaries in place    Prior to Admission medications   Medication Sig Start Date End Date Taking? Authorizing Provider  acetaminophen (TYLENOL) 500 MG tablet Take 500 mg by mouth every 6 (six) hours as needed.    [provider]  amLODipine (NORVASC) 10 MG tablet Take 1 tablet (10 mg total) by mouth  daily. 08/19/18   [name], MD  aspirin 81 MG tablet Take 81 mg by mouth daily.    [provider]  benazepril (LOTENSIN) 20 MG tablet Take 1 tablet (20 mg total) by mouth daily. 08/19/18   [name], MD  Blood Glucose Monitoring Suppl (ACCU-CHEK AVIVA) device Use as instructed to check sugars once daily E11.21 01/06/18 01/06/19  [name], MD  Calcium Carbonate-Vitamin D (CALCIUM 500/VITAMIN D PO) Take 2 tablets by mouth daily.    [provider]  diphenhydrAMINE (BENADRYL) 25 MG tablet Take 25 mg by mouth at bedtime as needed.    [provider]  fenofibrate 160 MG tablet Take 1 tablet (160 mg total) by mouth daily. 08/19/18   [name], MD  fluticasone [facility]) 50 MCG/ACT nasal spray USE 2 SPRAYS INTO BOTH  NOSTRILS DAILY AS NEEDED 08/19/18   [name], MD  glucose blood (ACCU-CHEK AVIVA) test strip Use as directed to check sugars once daily E11.21 01/03/18   [name], MD  hydrochlorothiazide (MICROZIDE) 12.5 MG capsule Take 1 capsule (12.5 mg total) by mouth daily. 08/19/18   [name], MD  Astrid Drafts Omega-3 500 MG CAPS Take 1 capsule by mouth daily.    [provider]  Lancets (ACCU-CHEK SOFT TOUCH) lancets Use as instructed to check sugars once daily E11.21 01/06/18   [name], MD  metFORMIN (GLUCOPHAGE XR) 500 MG 24 hr tablet Take 1 tablet (500 mg total) by mouth daily with breakfast. 08/19/18   [name], MD  Multiple Vitamin (MULTIVITAMIN) tablet Take 1 tablet by mouth daily.    [provider]  rosuvastatin (CRESTOR) 10 MG tablet Take 1 tablet (10 mg total) by mouth at bedtime. 08/19/18   [name], MD    Allergies Azithromycin; Sulfa antibiotics; and Adhesive [tape]  Family History  Problem Relation Age of Onset  . Stroke Mother        several  . Hyperlipidemia Mother   . Hypertension Mother   . Cancer Father        colon  . Hypertension Father   .  Hyperlipidemia Father   . Coronary artery disease Father 59       MIx1, CABG  . Cancer Paternal Aunt        abdominal  . Coronary artery disease Maternal Grandmother   . Diabetes Maternal Grandfather   . Coronary artery disease Maternal Grandfather   . Breast cancer Neg Hx     Social History Social History   Tobacco Use  . Smoking status: Never Smoker  . Smokeless tobacco: Never Used  Substance Use Topics  . Alcohol use: Yes    Alcohol/week: 1.0 - 2.0 standard drinks    Types: 1 - 2 Glasses of wine per week    Comment: Occasional/1 mixed drink per  month  . Drug use: No    Review of Systems Constitutional: No fever/chills Eyes: No visual changes. ENT: No sore throat.  Residual nasal bleeding. Cardiovascular: Denies chest pain. Respiratory: Denies shortness of breath. Gastrointestinal: No abdominal pain.  No nausea, no vomiting.  No diarrhea.  No constipation. Genitourinary: Negative for dysuria. Musculoskeletal: Negative for back pain. Skin: Negative for rash. Neurological: Negative for headaches, focal weakness or numbness. Endocrine:Diabetes, hyperlipidemia, and hypertension. Hematological/Lymphatic:Polycythemia Allergic/Immunilogical: See medication list  ____________________________________________   PHYSICAL EXAM:  VITAL SIGNS: ED Triage Vitals  Enc Vitals Group     BP 09/23/18 1122 (!) 158/80     Pulse Rate 09/23/18 1122 97     Resp 09/23/18 1122 18     Temp 09/23/18 1122 98.3 F (36.8 C)     Temp Source 09/23/18 1122 Oral     SpO2 09/23/18 1122 96 %     Weight 09/23/18 1123 178 lb (80.7 kg)     Height 09/23/18 1123 5\' 1"  (1.549 m)     Head Circumference --      Peak Flow --      Pain Score 09/23/18 1123 0     Pain Loc --      Pain Edu? --      Excl. in [place]? --    Constitutional: Alert and oriented. Well appearing and in no acute distress. Nose: Dried blood anterior nasal canal. Mouth/Throat: Mucous membranes are moist.  Oropharynx  non-erythematous. Neck: No stridor.  Cardiovascular: Normal rate, regular rhythm. Grossly normal heart sounds.  Good peripheral circulation. Respiratory: Normal respiratory effort.  No retractions. Lungs CTAB. Neurologic:  Normal speech and language. No gross focal neurologic deficits are appreciated. No gait instability. Skin:  Skin is warm, dry and intact. No rash noted. Psychiatric: Mood and affect are normal. Speech and behavior are normal.  ____________________________________________   LABS (all labs ordered are listed, but only abnormal results are displayed)  Labs Reviewed - No data to display ____________________________________________  EKG   ____________________________________________  RADIOLOGY  ED MD interpretation:    Official radiology report(s): No results found.  ____________________________________________   PROCEDURES  Procedure(s) performed: None  Procedures  Critical Care performed: No  ____________________________________________   INITIAL IMPRESSION / ASSESSMENT AND PLAN / ED COURSE  As part of my medical decision making, I reviewed the following data within the electronic MEDICAL RECORD NUMBER    Resolved epistaxis from the right nostril.  Patient given discharge care instruction in a nasal clamp.  Return to ED if condition recurs.      ____________________________________________   FINAL CLINICAL IMPRESSION(S) / ED DIAGNOSES  Final diagnoses:  Right-sided epistaxis     ED Discharge Orders    None       Note:  This document was prepared using Dragon voice recognition software and may include unintentional dictation errors.    Sable Feil, PA-C 09/23/18 1216    Lisa Roca, MD 09/23/18 [phone]

## 2018-09-23 NOTE — ED Notes (Signed)
Afrin to right nostril and then nose clip applied.

## 2018-09-23 NOTE — Discharge Instructions (Addendum)
Follow discharge care instruction use nasal clamp for recurrent nasal bleeding.  Advised to decrease activities for the next 48 hours.

## 2018-10-23 ENCOUNTER — Other Ambulatory Visit: Payer: Self-pay | Admitting: Family Medicine

## 2018-11-04 NOTE — Progress Notes (Signed)
BP 124/82 (BP Location: Left Arm, Patient Position: Sitting, Cuff Size: Normal)   Pulse 88   Temp 97.8 F (36.6 C) (Oral)   Ht 5\' 1"  (1.549 m)   Wt 180 lb (81.6 kg)   SpO2 94%   BMI 34.01 kg/m    CC: knee pain Subjective:    Patient ID: Katherine Oliver, female    DOB: 11-10-1945, 73 y.o.   MRN: [id number]  HPI: Katherine Oliver is a 73 y.o. female presenting on 11/05/2018 for Knee Pain (C/o right knee pain for about 2 wks. States she injured knee 20-25 yrs ago and has had pain off and on since. Wears knee brace and tried Aleve, lidocaine spray and Tylenol. These treatments have helped but now pain is worsening.  Pt accompanied by her husband, [name].) and Sore Throat (Woke up this morning with sore throat. )   2 wk h/o R knee pain - denies inciting trauma/injury. Points to anterior knee as site of pain. No redness/swelling or warmth. Has been taking 2 aleve bid. No locking of knee.   Remote injury to R knee 25 yrs ago - unable to run or jump or play tennis since. Saw ortho. Tends to get intermittently achey with cold weather, managed with knee brace and tylenol.   ST that started this morning. She was coughing last night. No fevers, congestion, rhinorrhea.      Relevant past medical, surgical, family and social history reviewed and updated as indicated. Interim medical history since our last visit reviewed. Allergies and medications reviewed and updated. Outpatient Medications Prior to Visit  Medication Sig Dispense Refill  . acetaminophen (TYLENOL) 500 MG tablet Take 500 mg by mouth every 6 (six) hours as needed.    Marland Kitchen amLODipine (NORVASC) 10 MG tablet Take 1 tablet (10 mg total) by mouth daily. 90 tablet 3  . aspirin 81 MG tablet Take 81 mg by mouth daily.    . benazepril (LOTENSIN) 20 MG tablet Take 1 tablet (20 mg total) by mouth daily. 90 tablet 3  . Blood Glucose Monitoring Suppl (ACCU-CHEK AVIVA) device Use as instructed to check sugars once daily E11.21 1  each 0  . Calcium Carbonate-Vitamin D (CALCIUM 500/VITAMIN D PO) Take 2 tablets by mouth daily.    . diphenhydrAMINE (BENADRYL) 25 MG tablet Take 25 mg by mouth at bedtime as needed.    . fenofibrate 160 MG tablet Take 1 tablet (160 mg total) by mouth daily. 90 tablet 3  . fluticasone (FLONASE) 50 MCG/ACT nasal spray USE 2 SPRAYS INTO BOTH  NOSTRILS DAILY AS NEEDED 48 g 3  . glucose blood (ACCU-CHEK AVIVA) test strip Use as directed to check sugars once daily E11.21 100 each 3  . hydrochlorothiazide (MICROZIDE) 12.5 MG capsule Take 1 capsule (12.5 mg total) by mouth daily. 90 capsule 3  . Krill Oil Omega-3 500 MG CAPS Take 1 capsule by mouth daily.    . Lancets (ACCU-CHEK SOFT TOUCH) lancets Use as instructed to check sugars once daily E11.21 100 each 3  . metFORMIN (GLUCOPHAGE XR) 500 MG 24 hr tablet Take 1 tablet (500 mg total) by mouth daily with breakfast. 90 tablet 3  . Multiple Vitamin (MULTIVITAMIN) tablet Take 1 tablet by mouth daily.    . rosuvastatin (CRESTOR) 10 MG tablet Take 1 tablet (10 mg total) by mouth at bedtime. 90 tablet 3   No facility-administered medications prior to visit.      Per HPI unless specifically indicated in ROS section  below Review of Systems Objective:    BP 124/82 (BP Location: Left Arm, Patient Position: Sitting, Cuff Size: Normal)   Pulse 88   Temp 97.8 F (36.6 C) (Oral)   Ht 5\' 1"  (1.549 m)   Wt 180 lb (81.6 kg)   SpO2 94%   BMI 34.01 kg/m   Wt Readings from Last 3 Encounters:  11/05/18 180 lb (81.6 kg)  09/23/18 178 lb (80.7 kg)  08/19/18 180 lb 4 oz (81.8 kg)    Physical Exam Vitals signs and nursing note reviewed.  Constitutional:      General: She is not in acute distress.    Appearance: Normal appearance. She is well-developed.  HENT:     Head: Normocephalic and atraumatic.     Nose: No congestion.     Mouth/Throat:     Mouth: Mucous membranes are moist.     Pharynx: Oropharynx is clear. No oropharyngeal exudate or posterior  oropharyngeal erythema.  Eyes:     Extraocular Movements: Extraocular movements intact.     Pupils: Pupils are equal, round, and reactive to light.  Musculoskeletal: Normal range of motion.        General: No swelling.     Right lower leg: No edema.     Left lower leg: No edema.     Comments: L knee WNL R knee exam: No deformity on inspection. Tender to palpation anterior knee at patella and below.  No effusion/swelling noted. FROM in flex/extension without crepitus. No popliteal fullness. Neg drawer test. Tender with mcmurray test. No pain with valgus/varus stress. + PFgrind. No abnormal patellar mobility.   Skin:    General: Skin is warm and dry.     Findings: No erythema or rash.  Neurological:     Mental Status: She is alert.  Psychiatric:        Mood and Affect: Mood normal.       Assessment & Plan:   Problem List Items Addressed This Visit    Sore throat    ?viral. Supportive care reviewed with patient (honey with lemon, salt water gargles, etc). Update if not improving with treatment.       Acute pain of right knee - Primary    Anticipate PFPS or patellofemoral arthritis, r/o meniscal injury.  Check xray to eval arthritic load of R knee given longevity of intermittent symptoms.  Supportive care as per instructions - continue aleve, ice,heat, nsaids, rest. Provided with exercises from [facility] pt advisor on PFPS. If not improving with above, she will go see Dr Noemi Chapel her orthopedist.           No orders of the defined types were placed in this encounter.  No orders of the defined types were placed in this encounter.  Patient Instructions  For sore throat -looking ok today. Try honey with lemon, salt water gargles. xrays for R knee pain done today - I think you likely have arthritis of the space between your kneecap and knee (patellofemoral compartment). Continue brace use, ice/heating pad (whichever soothes better), and aleve use (take with food so it doesn't upset  stomach). Do exercises provided today. Let us know if not improving with this.    Follow up plan: Return if symptoms worsen or fail to improve.  [name], MD

## 2018-11-05 ENCOUNTER — Ambulatory Visit (INDEPENDENT_AMBULATORY_CARE_PROVIDER_SITE_OTHER)
Admission: RE | Admit: 2018-11-05 | Discharge: 2018-11-05 | Disposition: A | Discharge status: Home / Self Care | Payer: Medicare Other | Source: Ambulatory Visit | Attending: Family Medicine | Admitting: Family Medicine

## 2018-11-05 ENCOUNTER — Ambulatory Visit (INDEPENDENT_AMBULATORY_CARE_PROVIDER_SITE_OTHER): Payer: Medicare Other | Admitting: Family Medicine

## 2018-11-05 ENCOUNTER — Encounter: Payer: Self-pay | Admitting: Family Medicine

## 2018-11-05 ENCOUNTER — Other Ambulatory Visit: Payer: Self-pay | Admitting: Family Medicine

## 2018-11-05 VITALS — BP 124/82 | HR 88 | Temp 97.8°F | Ht 61.0 in | Wt 180.0 lb

## 2018-11-05 DIAGNOSIS — M25561 Pain in right knee: Secondary | ICD-10-CM

## 2018-11-05 DIAGNOSIS — J029 Acute pharyngitis, unspecified: Secondary | ICD-10-CM

## 2018-11-05 NOTE — Assessment & Plan Note (Addendum)
Anticipate PFPS or patellofemoral arthritis, r/o meniscal injury.  Check xray to eval arthritic load of R knee given longevity of intermittent symptoms.  Supportive care as per instructions - continue aleve, ice,heat, nsaids, rest. Provided with exercises from [facility] pt advisor on PFPS. If not improving with above, she will go see Dr Noemi Chapel her orthopedist.

## 2018-11-05 NOTE — Assessment & Plan Note (Signed)
?  viral. Supportive care reviewed with patient (honey with lemon, salt water gargles, etc). Update if not improving with treatment.

## 2018-11-05 NOTE — Patient Instructions (Addendum)
For sore throat -looking ok today. Try honey with lemon, salt water gargles. xrays for R knee pain done today - I think you likely have arthritis of the space between your kneecap and knee (patellofemoral compartment). Continue brace use, ice/heating pad (whichever soothes better), and aleve use (take with food so it doesn't upset stomach). Do exercises provided today. Let us know if not improving with this.

## 2018-11-24 ENCOUNTER — Other Ambulatory Visit: Payer: Self-pay

## 2018-11-24 MED ORDER — FENOFIBRATE 160 MG PO TABS: 160.0000 mg | ORAL_TABLET | Freq: Every day | ORAL | 3 refills | Status: DC

## 2018-11-24 MED ORDER — BENAZEPRIL HCL 20 MG PO TABS: 20.0000 mg | ORAL_TABLET | Freq: Every day | ORAL | 3 refills | Status: DC

## 2018-11-24 NOTE — Telephone Encounter (Signed)
E-scribed refills.  

## 2018-11-25 DIAGNOSIS — S83241A Other tear of medial meniscus, current injury, right knee, initial encounter: Secondary | ICD-10-CM | POA: Diagnosis not present

## 2018-11-30 DIAGNOSIS — M25561 Pain in right knee: Secondary | ICD-10-CM | POA: Diagnosis not present

## 2018-12-13 DIAGNOSIS — M1711 Unilateral primary osteoarthritis, right knee: Secondary | ICD-10-CM | POA: Diagnosis not present

## 2018-12-15 ENCOUNTER — Telehealth: Payer: Self-pay | Admitting: Family Medicine

## 2018-12-15 NOTE — Telephone Encounter (Signed)
Received preop clearance for R total knee replacement by Dr Noemi Chapel - plz call and schedule preop evaluation in office.

## 2018-12-15 NOTE — Telephone Encounter (Signed)
Spoke with pt relaying Dr. Synthia Innocent message.  Pt is scheduled for 12/29/18 at 3:00 PM.

## 2018-12-29 ENCOUNTER — Encounter: Payer: Self-pay | Admitting: Family Medicine

## 2018-12-29 ENCOUNTER — Ambulatory Visit (INDEPENDENT_AMBULATORY_CARE_PROVIDER_SITE_OTHER)
Admission: RE | Admit: 2018-12-29 | Discharge: 2018-12-29 | Disposition: A | Discharge status: Home / Self Care | Payer: Medicare Other | Source: Ambulatory Visit | Attending: Family Medicine | Admitting: Family Medicine

## 2018-12-29 ENCOUNTER — Ambulatory Visit (INDEPENDENT_AMBULATORY_CARE_PROVIDER_SITE_OTHER): Payer: Medicare Other | Admitting: Family Medicine

## 2018-12-29 VITALS — BP 132/76 | HR 93 | Temp 97.6°F | Ht 61.0 in | Wt 182.1 lb

## 2018-12-29 DIAGNOSIS — E1122 Type 2 diabetes mellitus with diabetic chronic kidney disease: Secondary | ICD-10-CM

## 2018-12-29 DIAGNOSIS — N183 Chronic kidney disease, stage 3 unspecified: Secondary | ICD-10-CM

## 2018-12-29 DIAGNOSIS — E1121 Type 2 diabetes mellitus with diabetic nephropathy: Secondary | ICD-10-CM | POA: Diagnosis not present

## 2018-12-29 DIAGNOSIS — D751 Secondary polycythemia: Secondary | ICD-10-CM | POA: Diagnosis not present

## 2018-12-29 DIAGNOSIS — E669 Obesity, unspecified: Secondary | ICD-10-CM | POA: Diagnosis not present

## 2018-12-29 DIAGNOSIS — I10 Essential (primary) hypertension: Secondary | ICD-10-CM

## 2018-12-29 DIAGNOSIS — M25561 Pain in right knee: Secondary | ICD-10-CM | POA: Diagnosis not present

## 2018-12-29 DIAGNOSIS — Z01818 Encounter for other preprocedural examination: Secondary | ICD-10-CM

## 2018-12-29 DIAGNOSIS — I444 Left anterior fascicular block: Secondary | ICD-10-CM | POA: Diagnosis not present

## 2018-12-29 LAB — CBC WITH DIFFERENTIAL/PLATELET
BASOS PCT: 0.6 % (ref 0.0–3.0)
Basophils Absolute: 0.1 10*3/uL (ref 0.0–0.1)
Eosinophils Absolute: 0 10*3/uL (ref 0.0–0.7)
Eosinophils Relative: 0.1 % (ref 0.0–5.0)
HCT: 45.6 % (ref 36.0–46.0)
Hemoglobin: 15.1 g/dL — ABNORMAL HIGH (ref 12.0–15.0)
Lymphocytes Relative: 21.9 % (ref 12.0–46.0)
Lymphs Abs: 2 10*3/uL (ref 0.7–4.0)
MCHC: 33.1 g/dL (ref 30.0–36.0)
MCV: 83.9 fl (ref 78.0–100.0)
Monocytes Absolute: 0.8 10*3/uL (ref 0.1–1.0)
Monocytes Relative: 9.3 % (ref 3.0–12.0)
NEUTROS ABS: 6.2 10*3/uL (ref 1.4–7.7)
Neutrophils Relative %: 68.1 % (ref 43.0–77.0)
PLATELETS: 259 10*3/uL (ref 150.0–400.0)
RBC: 5.43 Mil/uL — ABNORMAL HIGH (ref 3.87–5.11)
RDW: 14.8 % (ref 11.5–15.5)
WBC: 9.1 10*3/uL (ref 4.0–10.5)

## 2018-12-29 LAB — BASIC METABOLIC PANEL
BUN: 33 mg/dL — ABNORMAL HIGH (ref 6–23)
CO2: 27 mEq/L (ref 19–32)
Calcium: 10.2 mg/dL (ref 8.4–10.5)
Chloride: 102 mEq/L (ref 96–112)
Creatinine, Ser: 1.24 mg/dL — ABNORMAL HIGH (ref 0.40–1.20)
GFR: 42.24 mL/min — ABNORMAL LOW (ref >60.00)
Glucose, Bld: 110 mg/dL — ABNORMAL HIGH (ref 70–99)
Potassium: 4 mEq/L (ref 3.5–5.1)
Sodium: 140 mEq/L (ref 135–145)

## 2018-12-29 LAB — HEMOGLOBIN A1C: Hgb A1c MFr Bld: 6.7 % — ABNORMAL HIGH (ref 4.6–6.5)

## 2018-12-29 NOTE — Assessment & Plan Note (Signed)
Chronic, great control as evidenced by cbg log she brings this month. Update A1c

## 2018-12-29 NOTE — Assessment & Plan Note (Signed)
Update Cr.  

## 2018-12-29 NOTE — Assessment & Plan Note (Addendum)
RCRI = 0. Check BNP, if normal, adequately low risk to proceed with surgery.  Check EKG, CXR, labs today. Will send all results to Dr Noemi Chapel.

## 2018-12-29 NOTE — Progress Notes (Signed)
BP 132/76 (BP Location: Left Arm, Patient Position: Sitting, Cuff Size: Normal)   Pulse 93   Temp 97.6 F (36.4 C) (Oral)   Ht 5\' 1"  (1.549 m)   Wt 182 lb 1 oz (82.6 kg)   SpO2 93%   BMI 34.40 kg/m    CC: preop eval Subjective:    Patient ID: Katherine Oliver, female    DOB: 07-May-1946, 73 y.o.   MRN: [id number]  HPI: Katherine Oliver is a 73 y.o. 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 Currently using knee brace with benefit. Has started using cane as well. She has increased naprosyn and tylenol due to knee pain.   Has tolerated general anesthesia in the past, latest 02/2017 for trigger finger.  No known cardiac history. Had normal cardiolite stress test 2005 per records. Denies chest pain, tightness, shortness of breath, dizziness, palpitations, headache, leg swelling.  Before knee trouble, able to walk up 1.5 flights of stairs without dyspnea.   Diabetes well managed with metformin XR 500mg  daily. Brings sugar log showing marked improvement this month - fasting cbgs range 100-130     Relevant past medical, surgical, family and social history reviewed and updated as indicated. Interim medical history since our last visit reviewed. Allergies and medications reviewed and updated. Outpatient Medications Prior to Visit  Medication Sig Dispense Refill  . acetaminophen (TYLENOL) 500 MG tablet Take 500 mg by mouth every 6 (six) hours as needed.    Marland Kitchen amLODipine (NORVASC) 10 MG tablet Take 1 tablet (10 mg total) by mouth daily. 90 tablet 3  . benazepril (LOTENSIN) 20 MG tablet Take 1 tablet (20 mg total) by mouth daily. 90 tablet 3  . Blood Glucose Monitoring Suppl (ACCU-CHEK AVIVA) device Use as instructed to check sugars once daily E11.21 1 each 0  . diphenhydrAMINE (BENADRYL) 25 MG tablet Take 25 mg by mouth at bedtime as needed.    . fenofibrate  160 MG tablet Take 1 tablet (160 mg total) by mouth daily. 90 tablet 3  . fluticasone (FLONASE) 50 MCG/ACT nasal spray USE 2 SPRAYS INTO BOTH  NOSTRILS DAILY AS NEEDED 48 g 3  . glucose blood (ACCU-CHEK AVIVA) test strip Use as directed to check sugars once daily E11.21 100 each 3  . hydrochlorothiazide (MICROZIDE) 12.5 MG capsule Take 1 capsule (12.5 mg total) by mouth daily. 90 capsule 3  . Lancets (ACCU-CHEK SOFT TOUCH) lancets Use as instructed to check sugars once daily E11.21 100 each 3  . metFORMIN (GLUCOPHAGE XR) 500 MG 24 hr tablet Take 1 tablet (500 mg total) by mouth daily with breakfast. 90 tablet 3  . naproxen sodium (ALEVE) 220 MG tablet Take 220 mg by mouth at bedtime. Takes 2 tablets daily at bedtime    . rosuvastatin (CRESTOR) 10 MG tablet Take 1 tablet (10 mg total) by mouth at bedtime. 90 tablet 3  . aspirin 81 MG tablet Take 81 mg by mouth daily.    . Calcium Carbonate-Vitamin D (CALCIUM 500/VITAMIN D PO) Take 2 tablets by mouth daily.    Astrid Drafts Omega-3 500 MG CAPS Take 1 capsule by mouth daily.    . Multiple Vitamin (MULTIVITAMIN) tablet Take 1 tablet by mouth daily.     No facility-administered medications prior to visit.      Per HPI unless specifically indicated in ROS section below Review of Systems Objective:  BP 132/76 (BP Location: Left Arm, Patient Position: Sitting, Cuff Size: Normal)   Pulse 93   Temp 97.6 F (36.4 C) (Oral)   Ht 5\' 1"  (1.549 m)   Wt 182 lb 1 oz (82.6 kg)   SpO2 93%   BMI 34.40 kg/m   Wt Readings from Last 3 Encounters:  12/29/18 182 lb 1 oz (82.6 kg)  11/05/18 180 lb (81.6 kg)  09/23/18 178 lb (80.7 kg)    Physical Exam Vitals signs and nursing note reviewed.  Constitutional:      General: She is not in acute distress.    Appearance: Normal appearance.  HENT:     Mouth/Throat:     Mouth: Mucous membranes are moist.  Eyes:     Extraocular Movements: Extraocular movements intact.     Pupils: Pupils are equal, round, and  reactive to light.     Comments: Cataracts present  Cardiovascular:     Rate and Rhythm: Normal rate and regular rhythm.     Pulses: Normal pulses.     Heart sounds: Normal heart sounds. No murmur.  Pulmonary:     Effort: Pulmonary effort is normal. No respiratory distress.     Breath sounds: Normal breath sounds. No wheezing, rhonchi or rales.  Musculoskeletal: Normal range of motion.     Right lower leg: No edema.     Left lower leg: No edema.  Skin:    General: Skin is warm and dry.     Findings: No rash.  Neurological:     Mental Status: She is alert.  Psychiatric:        Mood and Affect: Mood normal.       Results for orders placed or performed in visit on 08/31/18  HM MAMMOGRAPHY  Result Value Ref Range   HM Mammogram 0-4 Bi-Rad 0-4 Bi-Rad, Self Reported Normal   EKG - NSR rate 90s, LAD with LAFB (new), no acute ST/T changes Assessment & Plan:   Problem List Items Addressed This Visit    Pre-op exam - Primary    RCRI = 0. Check BNP, if normal, adequately low risk to proceed with surgery.  Check EKG, CXR, labs today. Will send all results to Dr Noemi Chapel.       Relevant Orders   EKG 12-Lead (Completed)   DG Chest 2 View   Basic metabolic panel   CBC with Differential/Platelet   Brain natriuretic peptide   Polycythemia    Update CBC.       Relevant Orders   CBC with Differential/Platelet   Obesity, Class I, BMI 30-34.9   LAFB (left anterior fascicular block)    New since prior EKG 2015. Check heart ultrasound to further evaluate. Discussed with patient. This does not necessarily need to delay surgery.       Relevant Orders   ECHOCARDIOGRAM COMPLETE   HTN (hypertension)    Chronic, stable on current regimen.       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 nephropathy (HCC)    Chronic, great control as evidenced by cbg log she brings this month. Update A1c       Relevant Orders   Hemoglobin A1c   CKD stage 3 due to type 2 diabetes mellitus ([place])     Update Cr.       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           No orders of the  defined types were placed in this encounter.  Orders Placed This Encounter  Procedures  . DG Chest 2 View    Standing Status:   Future    Number of Occurrences:   1    Standing Expiration Date:   02/27/2020    Order Specific Question:   Reason for Exam (SYMPTOM  OR DIAGNOSIS REQUIRED)    Answer:   preop eval    Order Specific Question:   Preferred imaging location?    Answer:   [facility]    Order Specific Question:   Radiology Contrast Protocol - do NOT remove file path    Answer:   [email or web address]  . Basic metabolic panel  . CBC with Differential/Platelet  . Hemoglobin A1c  . Brain natriuretic peptide  . EKG 12-Lead  . ECHOCARDIOGRAM COMPLETE    Standing Status:   Future    Standing Expiration Date:   03/28/2020    Order Specific Question:   Where should this test be performed    Answer:   [facility]    Order Specific Question:   Please indicate who you request to read the echo results.    Answer:   [facility]    Order Specific Question:   Perflutren DEFINITY (image enhancing agent) should be administered unless hypersensitivity or allergy exist    Answer:   Administer Perflutren    Order Specific Question:   Reason for exam-Echo    Answer:   Abnormal ECG  794.31 / R94.31    Follow up plan: No follow-ups on file.  [name], MD

## 2018-12-29 NOTE — Assessment & Plan Note (Signed)
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

## 2018-12-29 NOTE — Assessment & Plan Note (Signed)
Update CBC. 

## 2018-12-29 NOTE — Patient Instructions (Addendum)
Labs today EKG today - we will check heart ultrasound after some EKG changes.  Chest xray today We will send all results to Dr Noemi Chapel.

## 2018-12-29 NOTE — Assessment & Plan Note (Signed)
New since prior EKG 2015. Check heart ultrasound to further evaluate. Discussed with patient. This does not necessarily need to delay surgery.

## 2018-12-29 NOTE — Assessment & Plan Note (Signed)
Chronic, stable on current regimen.  

## 2018-12-30 LAB — BRAIN NATRIURETIC PEPTIDE: Pro B Natriuretic peptide (BNP): 26 pg/mL (ref 0.0–100.0)

## 2019-01-05 ENCOUNTER — Encounter: Payer: Self-pay | Admitting: Physician Assistant

## 2019-01-05 DIAGNOSIS — M1711 Unilateral primary osteoarthritis, right knee: Secondary | ICD-10-CM | POA: Diagnosis not present

## 2019-01-05 HISTORY — DX: Unilateral primary osteoarthritis, right knee: M17.11

## 2019-01-05 NOTE — H&P (Signed)
TOTAL KNEE ADMISSION H&P  Patient is being admitted for right total knee arthroplasty.  Subjective:  Chief Complaint:right knee pain.  HPI: Katherine Oliver, 73 y.o. female, has a history of pain and functional disability in the right knee due to arthritis and has failed non-surgical conservative treatments for greater than 12 weeks to includeNSAID's and/or analgesics, corticosteriod injections, viscosupplementation injections, flexibility and strengthening excercises, supervised PT with diminished ADL's post treatment, use of assistive devices, weight reduction as appropriate and activity modification.  Onset of symptoms was gradual, starting 10 years ago with gradually worsening course since that time. The patient noted no past surgery on the right knee(s).  Patient currently rates pain in the right knee(s) at 10 out of 10 with activity. Patient has night pain, worsening of pain with activity and weight bearing, pain that interferes with activities of daily living, crepitus and joint swelling.  Patient has evidence of subchondral sclerosis, periarticular osteophytes and joint space narrowing by imaging studies.  There is no active infection.  Patient Active Problem List   Diagnosis Date Noted  . Primary localized osteoarthritis of right knee 01/05/2019  . Pre-op exam 12/29/2018  . 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 Acute pain of right knee 11/05/2018  . Vulvar dermatitis 12/07/2017  . Stress due to illness of family member 08/18/2017  . Right hip pain 08/07/2016  . CKD stage 3 due to type 2 diabetes mellitus ([place]) 04/04/2016  . Trigger ring finger of right hand 04/04/2016  . Pulmonary nodules 01/03/2016  . Advanced care planning/counseling discussion 07/05/2015  . Obesity, Class I, BMI 30-34.9 07/05/2015  . 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 Osteopenia 06/03/2013  . Medicare annual wellness visit, subsequent 05/31/2012  . Rotator cuff  tendonitis, right 03/01/2012  . Polycythemia 12/02/2011  . 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 nephropathy ([name])   . HTN (hypertension)   . HLD (hyperlipidemia)   . History of recurrent UTIs   . Allergic rhinitis   . GERD (gastroesophageal reflux disease)   . Rosacea   . NAFLD (nonalcoholic fatty liver disease) 06/03/2010   Past Medical History:  Diagnosis Date  . Allergic rhinitis   . Arthritis   . 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 nephropathy ([place])    DSME at [facility] 01/2016   . GERD (gastroesophageal reflux disease)   . Hepatic steatosis    by abd [place] 05/2012, mild transaminitis - normal iron sat and viral hep panel (2011), stable [place] 2017  . History of chicken pox   . History of measles   . History of recurrent UTIs    on chronic keflex  . HLD (hyperlipidemia)   . HTN (hypertension)   . 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 Kidney cyst, acquired 01/2016   L kidney by [place]  . Kidney stone 01/2016   L kidney by [place]  . Lung nodules 11/2013   overall stable on f/u CT 01/2016  . Osteopenia 06/2013   mild, forearm T -1.1, hip and spine WNL  . Polycythemia    mild, stable (2013)  . Primary localized osteoarthritis of right knee 01/05/2019  . Rosacea    metrogel    Past Surgical History:  Procedure Laterality Date  . APPENDECTOMY  1987  . BREAST BIOPSY Right 1963  benign  . BREAST BIOPSY Right 08/2014   benign- core  . cardiolite stress test  04/2004   normal  . CESAREAN SECTION  [id number];5188   x2  . CHOLECYSTECTOMY  2003  . COLONOSCOPY  09/26/2008   adenomatous polyp, rpt 3-5 yrs  . COLONOSCOPY  08/2012   adenomatous polyps, diverticulosis, rec rpt 5 yrs [name])  . COLONOSCOPY WITH PROPOFOL N/A 02/05/2018   4TA, SSA, diverticulosis, rpt 3 yrs [name], [name], MD)  . dexa  2003   normal  . dexa   06/2013   [facility] - Tscore -1.1 forearm, normal spine and femur  . TRIGGER FINGER RELEASE  2007;2010;2011   bilateral  . TRIGGER FINGER RELEASE  02/2017  . VAGINAL HYSTERECTOMY  1984   for menorrhagia, ovaries in place    No current facility-administered medications for this encounter.    Current Outpatient Medications  Medication Sig Dispense Refill Last Dose  . acetaminophen (TYLENOL) 500 MG tablet Take 1,000 mg by mouth every 6 (six) hours as needed for moderate pain.    01/05/2019 at Unknown time  . amLODipine (NORVASC) 10 MG tablet Take 1 tablet (10 mg total) by mouth daily. 90 tablet 3 01/04/2019 at Unknown time  . aspirin 81 MG tablet Take 81 mg by mouth daily.   01/04/2019 at Unknown time  . benazepril (LOTENSIN) 20 MG tablet Take 1 tablet (20 mg total) by mouth daily. 90 tablet 3 01/05/2019 at Unknown time  . Blood Glucose Monitoring Suppl (ACCU-CHEK AVIVA) device Use as instructed to check sugars once daily E11.21 1 each 0 01/05/2019 at Unknown time  . Calcium Carbonate-Vitamin D (CALCIUM 500/VITAMIN D PO) Take 2 tablets by mouth daily.   Past Week at Unknown time  . diphenhydrAMINE (BENADRYL) 25 MG tablet Take 25 mg by mouth 2 (two) times daily as needed for allergies.    01/05/2019 at Unknown time  . fenofibrate 160 MG tablet Take 1 tablet (160 mg total) by mouth daily. 90 tablet 3 01/04/2019 at Unknown time  . fluticasone (FLONASE) 50 MCG/ACT nasal spray USE 2 SPRAYS INTO BOTH  NOSTRILS DAILY AS NEEDED (Patient taking differently: Place 2 sprays into both nostrils daily. USE 2 SPRAYS INTO BOTH  NOSTRILS DAILY AS NEEDED) 48 g 3 01/05/2019 at Unknown time  . glucose blood (ACCU-CHEK AVIVA) test strip Use as directed to check sugars once daily E11.21 100 each 3 01/05/2019 at Unknown time  . hydrochlorothiazide (MICROZIDE) 12.5 MG capsule Take 1 capsule (12.5 mg total) by mouth daily. 90 capsule 3 01/05/2019 at Unknown time  . Krill Oil Omega-3 500 MG CAPS Take 500 mg by mouth daily.    Past Week at Unknown  time  . Lancets (ACCU-CHEK SOFT TOUCH) lancets Use as instructed to check sugars once daily E11.21 100 each 3 01/05/2019 at Unknown time  . metFORMIN (GLUCOPHAGE XR) 500 MG 24 hr tablet Take 1 tablet (500 mg total) by mouth daily with breakfast. 90 tablet 3 01/05/2019 at Unknown time  . Multiple Vitamin (MULTIVITAMIN) tablet Take 1 tablet by mouth daily.   Past Week at Unknown time  . naproxen sodium (ALEVE) 220 MG tablet Take 220 mg by mouth at bedtime.    Past Week at Unknown time  . rosuvastatin (CRESTOR) 10 MG tablet Take 1 tablet (10 mg total) by mouth at bedtime. 90 tablet 3 01/04/2019 at Unknown time   Allergies  Allergen Reactions  . Azithromycin Itching    Okay if takes benadryl along with it  .  Nickel     Reaction to cheap earrings  . Sulfa Antibiotics   . Adhesive [Tape] Rash    Paper tape - blisters    Social History   Tobacco Use  . Smoking status: Never Smoker  . Smokeless tobacco: Never Used  Substance Use Topics  . Alcohol use: Yes    Alcohol/week: 1.0 - 2.0 standard drinks    Types: 1 - 2 Glasses of wine per week    Comment: Occasional/1 mixed drink per month    Family History  Problem Relation Age of Onset  . Stroke Mother        several  . Hyperlipidemia Mother   . Hypertension Mother   . Cancer Father        colon  . Hypertension Father   . Hyperlipidemia Father   . Coronary artery disease Father 32       MIx1, CABG  . Cancer Paternal Aunt        abdominal  . Coronary artery disease Maternal Grandmother   . Diabetes Maternal Grandfather   . Coronary artery disease Maternal Grandfather   . Breast cancer Neg Hx      ROS  Objective:  Physical Exam  Vital signs in last 24 hours: Temp:  [97.5 F (36.4 C)] 97.5 F (36.4 C) (03/04 1400) Pulse Rate:  [96] 96 (03/04 1400) BP: (155)/(81) 155/81 (03/04 1400) SpO2:  [94 %] 94 % (03/04 1400) Weight:  [82.6 kg] 82.6 kg (03/04 1400)  Labs:   Estimated body mass index is 34.39 kg/m as calculated from  the following:   Height as of this encounter: 5\' 1"  (1.549 m).   Weight as of this encounter: 82.6 kg.   Imaging Review Plain radiographs demonstrate severe degenerative joint disease of the right knee(s). The overall alignment issignificant varus. The bone quality appears to be good for age and reported activity level.      Assessment/Plan:  End stage arthritis, right knee  Principal Problem:   Primary localized osteoarthritis of right knee Active Problems:   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 nephropathy (HCC)   HTN (hypertension)   History of recurrent UTIs   NAFLD (nonalcoholic fatty liver disease)   GERD (gastroesophageal reflux disease)   Obesity, Class I, BMI 30-34.9   CKD stage 3 due to type 2 diabetes mellitus (HCC)   Acute pain of right knee   LAFB (left anterior fascicular block)   The patient history, physical examination, clinical judgment of the provider and imaging studies are consistent with end stage degenerative joint disease of the right knee(s) and total knee arthroplasty is deemed medically necessary. The treatment options including medical management, injection therapy arthroscopy and arthroplasty were discussed at length. The risks and benefits of total knee arthroplasty were presented and reviewed. The risks due to aseptic loosening, infection, stiffness, patella tracking problems, thromboembolic complications and other imponderables were discussed. The patient acknowledged the explanation, agreed to proceed with the plan and consent was signed. Patient is being admitted for inpatient treatment for surgery, pain control, PT, OT, prophylactic antibiotics, VTE prophylaxis, progressive ambulation and ADL's and discharge planning. The patient is planning to be discharged home with home health services    Anticipated LOS equal to or greater than 2 midnights due to - Age 16 and older with one or more of the following:  - Obesity  - Expected need  for hospital services (PT, OT, Nursing) required for safe  discharge  - Anticipated need for postoperative skilled  nursing care or inpatient rehab  - Active co-morbidities: Diabetes OR   - Unanticipated findings during/Post Surgery: None  - Patient is a high risk of re-admission due to: None

## 2019-01-07 NOTE — Progress Notes (Addendum)
12-29-18 (Epic), EKG, CXR, Surgical Clearance from Dr. Danise Mina

## 2019-01-07 NOTE — Patient Instructions (Addendum)
Katherine Oliver  01/07/2019   Your procedure is scheduled on: 01-17-19    Report to [facility] Main  Entrance    Report to [place] at 5:30 AM    Call this number if you have problems the morning of surgery [phone]    Remember: Do not eat food or drink liquids :After Midnight.    BRUSH YOUR TEETH MORNING OF SURGERY AND RINSE YOUR MOUTH OUT, NO CHEWING GUM CANDY OR MINTS.     Take these medicines the morning of surgery with A SIP OF WATER:  You may also bring and use your nasal spray as needed.    DO NOT TAKE ANY DIABETIC MEDICATIONS DAY OF YOUR SURGERY                               You may not have any metal on your body including hair pins and              piercings  Do not wear jewelry, make-up, lotions, powders or perfumes, deodorant             Do not wear nail polish.  Do not shave  48 hours prior to surgery.                Do not bring valuables to the hospital. [place].  Contacts, dentures or bridgework may not be worn into surgery.  Leave suitcase in the car. After surgery it may be brought to your room.     Patients discharged the day of surgery will not be allowed to drive home. IF YOU ARE HAVING SURGERY AND GOING HOME THE SAME DAY, YOU MUST HAVE AN ADULT TO DRIVE YOU HOME AND BE WITH YOU FOR 24 HOURS. YOU MAY GO HOME BY TAXI OR UBER OR ORTHERWISE, BUT AN ADULT MUST ACCOMPANY YOU HOME AND STAY WITH YOU FOR 24 HOURS.   Special Instructions: N/A              Please read over the following fact sheets you were given: _____________________________________________________________________             How to Manage Your Diabetes Before and After Surgery  Why is it important to control my blood sugar before and after surgery? . Improving blood sugar levels before and after surgery helps healing and can limit problems. . A way of improving blood sugar control is eating a healthy  diet by: o  Eating less sugar and carbohydrates o  Increasing activity/exercise o  Talking with your doctor about reaching your blood sugar goals . High blood sugars (greater than 180 mg/dL) can raise your risk of infections and slow your recovery, so you will need to focus on controlling your diabetes during the weeks before surgery. . Make sure that the doctor who takes care of your diabetes knows about your planned surgery including the date and location.  How do I manage my blood sugar before surgery? . Check your blood sugar at least 4 times a day, starting 2 days before surgery, to make sure that the level is not too high or low. o Check your blood sugar the morning of your surgery when you wake up and every 2 hours until you get  to the Short Stay unit. . If your blood sugar is less than 70 mg/dL, you will need to treat for low blood sugar: o Do not take insulin. o Treat a low blood sugar (less than 70 mg/dL) with  cup of clear juice (cranberry or apple), 4 glucose tablets, OR glucose gel. o Recheck blood sugar in 15 minutes after treatment (to make sure it is greater than 70 mg/dL). If your blood sugar is not greater than 70 mg/dL on recheck, call [phone] for further instructions. . Report your blood sugar to the short stay nurse when you get to Short Stay.  . If you are admitted to the hospital after surgery: o Your blood sugar will be checked by the staff and you will probably be given insulin after surgery (instead of oral diabetes medicines) to make sure you have good blood sugar levels. o The goal for blood sugar control after surgery is 80-180 mg/dL.   WHAT DO I DO ABOUT MY DIABETES MEDICATION?  Marland Kitchen Do not take oral diabetes medicines (pills) the morning of surgery.  . THE DAY BEFORE SURGERY, take your usual dose of Metformin          [place] - Preparing for Surgery Before surgery, you can play an important role.  Because skin is not sterile, your skin needs to be  as free of germs as possible.  You can reduce the number of germs on your skin by washing with CHG (chlorahexidine gluconate) soap before surgery.  CHG is an antiseptic cleaner which kills germs and bonds with the skin to continue killing germs even after washing. Please DO NOT use if you have an allergy to CHG or antibacterial soaps.  If your skin becomes reddened/irritated stop using the CHG and inform your nurse when you arrive at Short Stay. Do not shave (including legs and underarms) for at least 48 hours prior to the first CHG shower.  You may shave your face/neck. Please follow these instructions carefully:  1.  Shower with CHG Soap the night before surgery and the  morning of Surgery.  2.  If you choose to wash your hair, wash your hair first as usual with your  normal  shampoo.  3.  After you shampoo, rinse your hair and body thoroughly to remove the  shampoo.                           4.  Use CHG as you would any other liquid soap.  You can apply chg directly  to the skin and wash                       Gently with a scrungie or clean washcloth.  5.  Apply the CHG Soap to your body ONLY FROM THE NECK DOWN.   Do not use on face/ open                           Wound or open sores. Avoid contact with eyes, ears mouth and genitals (private parts).                       Wash face,  Genitals (private parts) with your normal soap.             6.  Wash thoroughly, paying special attention to the area where your surgery  will be performed.  7.  Thoroughly rinse your body with warm water from the neck down.  8.  DO NOT shower/wash with your normal soap after using and rinsing off  the CHG Soap.                9.  Pat yourself dry with a clean towel.            10.  Wear clean pajamas.            11.  Place clean sheets on your bed the night of your first shower and do not  sleep with pets. Day of Surgery : Do not apply any lotions/deodorants the morning of surgery.  Please wear clean clothes to the  hospital/surgery center.  FAILURE TO FOLLOW THESE INSTRUCTIONS MAY RESULT IN THE CANCELLATION OF YOUR SURGERY PATIENT SIGNATURE_________________________________  NURSE SIGNATURE__________________________________  ________________________________________________________________________    Adam Phenix  An incentive spirometer is a tool that can help keep your lungs clear and active. This tool measures how well you are filling your lungs with each breath. Taking long deep breaths may help reverse or decrease the chance of developing breathing (pulmonary) problems (especially infection) following:  A long period of time when you are unable to move or be active. BEFORE THE PROCEDURE   If the spirometer includes an indicator to show your best effort, your nurse or respiratory therapist will set it to a desired goal.  If possible, sit up straight or lean slightly forward. Try not to slouch.  Hold the incentive spirometer in an upright position. INSTRUCTIONS FOR USE  1. Sit on the edge of your bed if possible, or sit up as far as you can in bed or on a chair. 2. Hold the incentive spirometer in an upright position. 3. Breathe out normally. 4. Place the mouthpiece in your mouth and seal your lips tightly around it. 5. Breathe in slowly and as deeply as possible, raising the piston or the ball toward the top of the column. 6. Hold your breath for 3-5 seconds or for as long as possible. Allow the piston or ball to fall to the bottom of the column. 7. Remove the mouthpiece from your mouth and breathe out normally. 8. Rest for a few seconds and repeat Steps 1 through 7 at least 10 times every 1-2 hours when you are awake. Take your time and take a few normal breaths between deep breaths. 9. The spirometer may include an indicator to show your best effort. Use the indicator as a goal to work toward during each repetition. 10. After each set of 10 deep breaths, practice coughing to be sure  your lungs are clear. If you have an incision (the cut made at the time of surgery), support your incision when coughing by placing a pillow or rolled up towels firmly against it. Once you are able to get out of bed, walk around indoors and cough well. You may stop using the incentive spirometer when instructed by your caregiver.  RISKS AND COMPLICATIONS  Take your time so you do not get dizzy or light-headed.  If you are in pain, you may need to take or ask for pain medication before doing incentive spirometry. It is harder to take a deep breath if you are having pain. AFTER USE  Rest and breathe slowly and easily.  It can be helpful to keep track of a log of your progress. Your caregiver can provide you with a simple table to help with this. If you  are using the spirometer at home, follow these instructions: [place] IF:   You are having difficultly using the spirometer.  You have trouble using the spirometer as often as instructed.  Your pain medication is not giving enough relief while using the spirometer.  You develop fever of 100.5 F (38.1 C) or higher. SEEK IMMEDIATE MEDICAL CARE IF:   You cough up bloody sputum that had not been present before.  You develop fever of 102 F (38.9 C) or greater.  You develop worsening pain at or near the incision site. MAKE SURE YOU:   Understand these instructions.  Will watch your condition.  Will get help right away if you are not doing well or get worse. Document Released: 03/02/2007 Document Revised: 01/12/2012 Document Reviewed: 05/03/2007 ExitCare Patient Information 2014 ExitCare, [place].   ________________________________________________________________________   WHAT IS A BLOOD TRANSFUSION? Blood Transfusion Information  A transfusion is the replacement of blood or some of its parts. Blood is made up of multiple cells which provide different functions.  Red blood cells carry oxygen and are used for blood loss  replacement.  White blood cells fight against infection.  Platelets control bleeding.  Plasma helps clot blood.  Other blood products are available for specialized needs, such as hemophilia or other clotting disorders. BEFORE THE TRANSFUSION  Who gives blood for transfusions?   Healthy volunteers who are fully evaluated to make sure their blood is safe. This is blood bank blood. Transfusion therapy is the safest it has ever been in the practice of medicine. Before blood is taken from a donor, a complete history is taken to make sure that person has no history of diseases nor engages in risky social behavior (examples are intravenous drug use or sexual activity with multiple partners). The donor's travel history is screened to minimize risk of transmitting infections, such as malaria. The donated blood is tested for signs of infectious diseases, such as HIV and hepatitis. The blood is then tested to be sure it is compatible with you in order to minimize the chance of a transfusion reaction. If you or a relative donates blood, this is often done in anticipation of surgery and is not appropriate for emergency situations. It takes many days to process the donated blood. RISKS AND COMPLICATIONS Although transfusion therapy is very safe and saves many lives, the main dangers of transfusion include:   Getting an infectious disease.  Developing a transfusion reaction. This is an allergic reaction to something in the blood you were given. Every precaution is taken to prevent this. The decision to have a blood transfusion has been considered carefully by your caregiver before blood is given. Blood is not given unless the benefits outweigh the risks. AFTER THE TRANSFUSION  Right after receiving a blood transfusion, you will usually feel much better and more energetic. This is especially true if your red blood cells have gotten low (anemic). The transfusion raises the level of the red blood cells which  carry oxygen, and this usually causes an energy increase.  The nurse administering the transfusion will monitor you carefully for complications. HOME CARE INSTRUCTIONS  No special instructions are needed after a transfusion. You may find your energy is better. Speak with your caregiver about any limitations on activity for underlying diseases you may have. SEEK MEDICAL CARE IF:   Your condition is not improving after your transfusion.  You develop redness or irritation at the intravenous (IV) site. SEEK IMMEDIATE MEDICAL CARE IF:  Any of the  following symptoms occur over the next 12 hours:  Shaking chills.  You have a temperature by mouth above 102 F (38.9 C), not controlled by medicine.  Chest, back, or muscle pain.  People around you feel you are not acting correctly or are confused.  Shortness of breath or difficulty breathing.  Dizziness and fainting.  You get a rash or develop hives.  You have a decrease in urine output.  Your urine turns a dark color or changes to pink, red, or brown. Any of the following symptoms occur over the next 10 days:  You have a temperature by mouth above 102 F (38.9 C), not controlled by medicine.  Shortness of breath.  Weakness after normal activity.  The white part of the eye turns yellow (jaundice).  You have a decrease in the amount of urine or are urinating less often.  Your urine turns a dark color or changes to pink, red, or brown. Document Released: 10/17/2000 Document Revised: 01/12/2012 Document Reviewed: 06/05/2008 [facility] Patient Information 2014 [place], [place].  _______________________________________________________________________

## 2019-01-10 ENCOUNTER — Encounter (HOSPITAL_COMMUNITY): Payer: Self-pay

## 2019-01-10 ENCOUNTER — Other Ambulatory Visit: Payer: Self-pay

## 2019-01-10 ENCOUNTER — Encounter (HOSPITAL_COMMUNITY)
Admission: RE | Admit: 2019-01-10 | Discharge: 2019-01-10 | Disposition: A | Discharge status: Home / Self Care | Payer: Medicare Other | Source: Ambulatory Visit | Attending: Orthopedic Surgery | Admitting: Orthopedic Surgery

## 2019-01-10 DIAGNOSIS — Z01818 Encounter for other preprocedural examination: Secondary | ICD-10-CM | POA: Insufficient documentation

## 2019-01-10 DIAGNOSIS — M1711 Unilateral primary osteoarthritis, right knee: Secondary | ICD-10-CM | POA: Insufficient documentation

## 2019-01-10 LAB — COMPREHENSIVE METABOLIC PANEL
ALBUMIN: 5.1 g/dL — AB (ref 3.5–5.0)
ALT: 39 U/L (ref 0–44)
AST: 27 U/L (ref 15–41)
Alkaline Phosphatase: 49 U/L (ref 38–126)
Anion gap: 10 (ref 5–15)
BUN: 28 mg/dL — ABNORMAL HIGH (ref 8–23)
CO2: 27 mmol/L (ref 22–32)
Calcium: 10.1 mg/dL (ref 8.9–10.3)
Chloride: 104 mmol/L (ref 98–111)
Creatinine, Ser: 1.02 mg/dL — ABNORMAL HIGH (ref 0.44–1.00)
GFR calc Af Amer: >60 mL/min (ref >60)
GFR calc non Af Amer: 54 mL/min — ABNORMAL LOW (ref >60)
Glucose, Bld: 115 mg/dL — ABNORMAL HIGH (ref 70–99)
Potassium: 4.2 mmol/L (ref 3.5–5.1)
Sodium: 141 mmol/L (ref 135–145)
Total Bilirubin: 0.7 mg/dL (ref 0.3–1.2)
Total Protein: 7.9 g/dL (ref 6.5–8.1)

## 2019-01-10 LAB — CBC WITH DIFFERENTIAL/PLATELET
Abs Immature Granulocytes: 0.02 10*3/uL (ref 0.00–0.07)
BASOS ABS: 0 10*3/uL (ref 0.0–0.1)
Basophils Relative: 1 %
Eosinophils Absolute: 0 10*3/uL (ref 0.0–0.5)
Eosinophils Relative: 0 %
HEMATOCRIT: 49.1 % — AB (ref 36.0–46.0)
Hemoglobin: 15 g/dL (ref 12.0–15.0)
Immature Granulocytes: 0 %
LYMPHS ABS: 1.4 10*3/uL (ref 0.7–4.0)
Lymphocytes Relative: 19 %
MCH: 27.2 pg (ref 26.0–34.0)
MCHC: 30.5 g/dL (ref 30.0–36.0)
MCV: 88.9 fL (ref 80.0–100.0)
Monocytes Absolute: 0.6 10*3/uL (ref 0.1–1.0)
Monocytes Relative: 8 %
Neutro Abs: 5.3 10*3/uL (ref 1.7–7.7)
Neutrophils Relative %: 72 %
Platelets: 241 10*3/uL (ref 150–400)
RBC: 5.52 MIL/uL — ABNORMAL HIGH (ref 3.87–5.11)
RDW: 13.8 % (ref 11.5–15.5)
WBC: 7.4 10*3/uL (ref 4.0–10.5)
nRBC: 0 % (ref 0.0–0.2)

## 2019-01-10 LAB — APTT: aPTT: 29 seconds (ref 24–36)

## 2019-01-10 LAB — GLUCOSE, CAPILLARY: Glucose-Capillary: 124 mg/dL — ABNORMAL HIGH (ref 70–99)

## 2019-01-10 LAB — PROTIME-INR
INR: 1 (ref 0.8–1.2)
Prothrombin Time: 13 seconds (ref 11.4–15.2)

## 2019-01-10 LAB — ABO/RH: ABO/RH(D): B POS

## 2019-01-10 LAB — SURGICAL PCR SCREEN
MRSA, PCR: NEGATIVE
Staphylococcus aureus: NEGATIVE

## 2019-01-10 LAB — HEMOGLOBIN A1C
Hgb A1c MFr Bld: 6.6 % — ABNORMAL HIGH (ref 4.8–5.6)
MEAN PLASMA GLUCOSE: 142.72 mg/dL

## 2019-01-10 NOTE — Progress Notes (Signed)
PCR result routed to Dr. Archie Endo office for review

## 2019-01-11 LAB — URINE CULTURE

## 2019-01-13 NOTE — Progress Notes (Signed)
Anesthesia Chart Review   Case:  [id number] Date/Time:  01/17/19 0700   Procedure:  TOTAL KNEE ARTHROPLASTY (Right )   Anesthesia type:  Choice   Pre-op diagnosis:  djd right knee   Location:  [facility]   Surgeon:  [name], MD      DISCUSSION: 73 yo never smoker with h/o HTN, hepatic steatosis, GERD, LAFB, DM II, HLD, polycythemia, right knee DJD scheduled for above procedure 01/17/19 with Dr. [name].   Pt seen by PCP, Dr. [name], 12/29/18.  New LAFB noted on EKG.  Per his note, "Check heart ultrasound to further evaluate. Discussed with patient. This does not necessarily need to delay surgery."   Pt can proceed with planned procedure barring acute status change.  VS: BP (!) 141/77   Pulse 84   Temp 36.4 C (Oral)   Resp 18   Ht 5\' 1"  (1.549 m)   Wt 81.6 kg   SpO2 95%   BMI 34.01 kg/m   PROVIDERS: [name], MD is PCP    LABS: Labs reviewed: Acceptable for surgery. (all labs ordered are listed, but only abnormal results are displayed)  Labs Reviewed  URINE CULTURE - Abnormal; Notable for the following components:      Result Value   Culture 70,000 COLONIES/mL CORYNEBACTERIUM SPECIES (*)    All other components within normal limits  HEMOGLOBIN A1C - Abnormal; Notable for the following components:   Hgb A1c MFr Bld 6.6 (*)    All other components within normal limits  CBC WITH DIFFERENTIAL/PLATELET - Abnormal; Notable for the following components:   RBC 5.52 (*)    HCT 49.1 (*)    All other components within normal limits  COMPREHENSIVE METABOLIC PANEL - Abnormal; Notable for the following components:   Glucose, Bld 115 (*)    BUN 28 (*)    Creatinine, Ser 1.02 (*)    Albumin 5.1 (*)    GFR calc non Af Amer 54 (*)    All other components within normal limits  GLUCOSE, CAPILLARY - Abnormal; Notable for the following components:   Glucose-Capillary 124 (*)    All other components within normal limits  SURGICAL PCR SCREEN  APTT   PROTIME-INR  TYPE AND SCREEN  ABO/RH     IMAGES: Chest Xray 12/29/18 FINDINGS: Heart size and mediastinal contours are within normal limits. Lungs are clear. No pleural effusion seen. No acute or suspicious osseous finding. Mild degenerative change within the slightly kyphotic thoracic spine.  IMPRESSION: No active cardiopulmonary disease. No evidence of pneumonia or pulmonary edema.   EKG: 01/10/2019 Rate 81 bpm Normal sinus rhythm  Possible anterior infarct, age undetermined  CV:  Past Medical History:  Diagnosis Date  . Allergic rhinitis   . Arthritis   . 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 nephropathy ([place])    DSME at [facility] 01/2016   . GERD (gastroesophageal reflux disease)   . Hepatic steatosis    by abd [place] 05/2012, mild transaminitis - normal iron sat and viral hep panel (2011), stable [place] 2017  . History of chicken pox   . History of measles   . History of recurrent UTIs    on chronic keflex  . HLD (hyperlipidemia)   . HTN (hypertension)   . Hypertensive retinopathy of both eyes, 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  [facility]  . Kidney cyst, acquired 01/2016   L kidney by [place]  . Kidney stone 01/2016   L kidney by [place]  . Lung nodules 11/2013   overall stable on f/u CT 01/2016  . Osteopenia 06/2013   mild, forearm T -1.1, hip and spine WNL  . Polycythemia    mild, stable (2013)  . Primary localized osteoarthritis of right knee 01/05/2019  . Rosacea    metrogel    Past Surgical History:  Procedure Laterality Date  . APPENDECTOMY  1987  . BREAST BIOPSY Right 1963   benign  . BREAST BIOPSY Right 08/2014   benign- core  . cardiolite stress test  04/2004   normal  . CESAREAN SECTION  1950;9326   x2  . CHOLECYSTECTOMY  2003  . COLONOSCOPY  09/26/2008   adenomatous polyp, rpt 3-5 yrs  . COLONOSCOPY  08/2012   adenomatous polyps,  diverticulosis, rec rpt 5 yrs [name])  . COLONOSCOPY WITH PROPOFOL N/A 02/05/2018   4TA, SSA, diverticulosis, rpt 3 yrs [name], [name], MD)  . dexa  2003   normal  . dexa  06/2013   [facility] - Tscore -1.1 forearm, normal spine and femur  . TRIGGER FINGER RELEASE  2007;2010;2011   bilateral  . TRIGGER FINGER RELEASE  02/2017  . VAGINAL HYSTERECTOMY  1984   for menorrhagia, ovaries in place    MEDICATIONS: . acetaminophen (TYLENOL) 500 MG tablet  . amLODipine (NORVASC) 10 MG tablet  . aspirin 81 MG tablet  . benazepril (LOTENSIN) 20 MG tablet  . Calcium Carbonate-Vitamin D (CALCIUM 500/VITAMIN D PO)  . diphenhydrAMINE (BENADRYL) 25 MG tablet  . fenofibrate 160 MG tablet  . fluticasone (FLONASE) 50 MCG/ACT nasal spray  . glucose blood (ACCU-CHEK AVIVA) test strip  . hydrochlorothiazide (MICROZIDE) 12.5 MG capsule  . Krill Oil Omega-3 500 MG CAPS  . Lancets (ACCU-CHEK SOFT TOUCH) lancets  . metFORMIN (GLUCOPHAGE XR) 500 MG 24 hr tablet  . Multiple Vitamin (MULTIVITAMIN) tablet  . naproxen sodium (ALEVE) 220 MG tablet  . rosuvastatin (CRESTOR) 10 MG tablet   No current facility-administered medications for this encounter.     Maia Plan [facility] Pre-Surgical Testing [phone] 01/13/19 2:19 PM

## 2019-01-13 NOTE — Anesthesia Preprocedure Evaluation (Addendum)
Anesthesia Evaluation  Patient identified by MRN, date of birth, ID band Patient awake    Reviewed: Allergy & Precautions, NPO status , Patient's Chart, lab work & pertinent test results  Airway Mallampati: II  TM Distance: >3 FB Neck ROM: Full    Dental no notable dental hx.    Pulmonary neg pulmonary ROS,    Pulmonary exam normal breath sounds clear to auscultation       Cardiovascular hypertension, Pt. on medications negative cardio ROS Normal cardiovascular exam Rhythm:Regular Rate:Normal     Neuro/Psych negative neurological ROS  negative psych ROS   GI/Hepatic Neg liver ROS, GERD  ,  Endo/Other  negative endocrine ROSdiabetes, Type 2  Renal/GU negative Renal ROS  negative genitourinary   Musculoskeletal  (+) Arthritis , Osteoarthritis,    Abdominal (+) + obese,   Peds negative pediatric ROS (+)  Hematology negative hematology ROS (+)   Anesthesia Other Findings   Reproductive/Obstetrics negative OB ROS                            Anesthesia Physical Anesthesia Plan  ASA: III  Anesthesia Plan: Spinal   Post-op Pain Management:  Regional for Post-op pain   Induction: Intravenous  PONV Risk Score and Plan: 2 and Ondansetron and Midazolam  Airway Management Planned: Simple Face Mask  Additional Equipment:   Intra-op Plan:   Post-operative Plan:   Informed Consent: I have reviewed the patients History and Physical, chart, labs and discussed the procedure including the risks, benefits and alternatives for the proposed anesthesia with the patient or authorized representative who has indicated his/her understanding and acceptance.     Dental advisory given  Plan Discussed with: CRNA  Anesthesia Plan Comments: (See PAT note 01/10/19, Konrad Felix, PA-C)       Anesthesia Quick Evaluation

## 2019-01-14 ENCOUNTER — Other Ambulatory Visit: Payer: Self-pay | Admitting: Orthopedic Surgery

## 2019-01-14 NOTE — Care Plan (Signed)
Met with patient and husband in the office prior to surgery. She will discharge to home with family and HHPT. Equipment has been ordered to be delivered to home with CPM if possible. She is aware of plan and agreeable. Choice offered.     Ladell Heads, [place]

## 2019-01-16 MED ORDER — BUPIVACAINE LIPOSOME 1.3 % IJ SUSP
20.0000 mL | Freq: Once | INTRAMUSCULAR | Status: DC
  Filled 2019-01-16: qty 20

## 2019-01-17 ENCOUNTER — Other Ambulatory Visit: Payer: Self-pay

## 2019-01-17 ENCOUNTER — Inpatient Hospital Stay (HOSPITAL_COMMUNITY): Payer: Medicare Other | Admitting: Certified Registered"

## 2019-01-17 ENCOUNTER — Inpatient Hospital Stay (HOSPITAL_COMMUNITY): Payer: Medicare Other | Admitting: Physician Assistant

## 2019-01-17 ENCOUNTER — Observation Stay (HOSPITAL_COMMUNITY)
Admission: RE | Admit: 2019-01-17 | Discharge: 2019-01-18 | Disposition: A | Discharge status: Home / Self Care | Payer: Medicare Other | Attending: Orthopedic Surgery | Admitting: Orthopedic Surgery

## 2019-01-17 ENCOUNTER — Encounter (HOSPITAL_COMMUNITY)
Admission: RE | Disposition: A | Discharge status: Home / Self Care | Payer: Self-pay | Source: Home / Self Care | Attending: Orthopedic Surgery

## 2019-01-17 ENCOUNTER — Encounter (HOSPITAL_COMMUNITY): Payer: Self-pay | Admitting: *Deleted

## 2019-01-17 DIAGNOSIS — E118 Type 2 diabetes mellitus with unspecified complications: Secondary | ICD-10-CM | POA: Diagnosis present

## 2019-01-17 DIAGNOSIS — N183 Chronic kidney disease, stage 3 unspecified: Secondary | ICD-10-CM | POA: Diagnosis present

## 2019-01-17 DIAGNOSIS — Z6834 Body mass index (BMI) 34.0-34.9, adult: Secondary | ICD-10-CM | POA: Diagnosis not present

## 2019-01-17 DIAGNOSIS — M8580 Other specified disorders of bone density and structure, unspecified site: Secondary | ICD-10-CM | POA: Diagnosis not present

## 2019-01-17 DIAGNOSIS — Z7982 Long term (current) use of aspirin: Secondary | ICD-10-CM | POA: Insufficient documentation

## 2019-01-17 DIAGNOSIS — E1122 Type 2 diabetes mellitus with diabetic chronic kidney disease: Secondary | ICD-10-CM | POA: Diagnosis present

## 2019-01-17 DIAGNOSIS — I10 Essential (primary) hypertension: Secondary | ICD-10-CM | POA: Diagnosis present

## 2019-01-17 DIAGNOSIS — E663 Overweight: Secondary | ICD-10-CM | POA: Diagnosis present

## 2019-01-17 DIAGNOSIS — E785 Hyperlipidemia, unspecified: Secondary | ICD-10-CM | POA: Insufficient documentation

## 2019-01-17 DIAGNOSIS — Z7984 Long term (current) use of oral hypoglycemic drugs: Secondary | ICD-10-CM | POA: Insufficient documentation

## 2019-01-17 DIAGNOSIS — E1121 Type 2 diabetes mellitus with diabetic nephropathy: Secondary | ICD-10-CM

## 2019-01-17 DIAGNOSIS — G8918 Other acute postprocedural pain: Secondary | ICD-10-CM | POA: Diagnosis not present

## 2019-01-17 DIAGNOSIS — K219 Gastro-esophageal reflux disease without esophagitis: Secondary | ICD-10-CM | POA: Diagnosis not present

## 2019-01-17 DIAGNOSIS — R918 Other nonspecific abnormal finding of lung field: Secondary | ICD-10-CM | POA: Diagnosis not present

## 2019-01-17 DIAGNOSIS — Z7951 Long term (current) use of inhaled steroids: Secondary | ICD-10-CM | POA: Insufficient documentation

## 2019-01-17 DIAGNOSIS — I444 Left anterior fascicular block: Secondary | ICD-10-CM | POA: Diagnosis present

## 2019-01-17 DIAGNOSIS — E1136 Type 2 diabetes mellitus with diabetic cataract: Secondary | ICD-10-CM | POA: Diagnosis present

## 2019-01-17 DIAGNOSIS — M25561 Pain in right knee: Secondary | ICD-10-CM | POA: Diagnosis present

## 2019-01-17 DIAGNOSIS — I129 Hypertensive chronic kidney disease with stage 1 through stage 4 chronic kidney disease, or unspecified chronic kidney disease: Secondary | ICD-10-CM | POA: Diagnosis not present

## 2019-01-17 DIAGNOSIS — Z79899 Other long term (current) drug therapy: Secondary | ICD-10-CM | POA: Insufficient documentation

## 2019-01-17 DIAGNOSIS — M1711 Unilateral primary osteoarthritis, right knee: Principal | ICD-10-CM | POA: Diagnosis present

## 2019-01-17 DIAGNOSIS — E66811 Obesity, class 1: Secondary | ICD-10-CM | POA: Diagnosis present

## 2019-01-17 DIAGNOSIS — Z87440 Personal history of urinary (tract) infections: Secondary | ICD-10-CM | POA: Diagnosis present

## 2019-01-17 DIAGNOSIS — K760 Fatty (change of) liver, not elsewhere classified: Secondary | ICD-10-CM | POA: Diagnosis present

## 2019-01-17 DIAGNOSIS — E669 Obesity, unspecified: Secondary | ICD-10-CM | POA: Insufficient documentation

## 2019-01-17 DIAGNOSIS — E1169 Type 2 diabetes mellitus with other specified complication: Secondary | ICD-10-CM | POA: Diagnosis present

## 2019-01-17 HISTORY — PX: TOTAL KNEE ARTHROPLASTY: SHX125

## 2019-01-17 HISTORY — DX: Unilateral primary osteoarthritis, right knee: M17.11

## 2019-01-17 LAB — GLUCOSE, CAPILLARY
Glucose-Capillary: 125 mg/dL — ABNORMAL HIGH (ref 70–99)
Glucose-Capillary: 125 mg/dL — ABNORMAL HIGH (ref 70–99)
Glucose-Capillary: 218 mg/dL — ABNORMAL HIGH (ref 70–99)

## 2019-01-17 LAB — TYPE AND SCREEN
ABO/RH(D): B POS
Antibody Screen: NEGATIVE

## 2019-01-17 SURGERY — ARTHROPLASTY, KNEE, TOTAL
Anesthesia: Spinal | Site: Knee | Laterality: Right

## 2019-01-17 MED ORDER — ROPIVACAINE HCL 5 MG/ML IJ SOLN
INTRAMUSCULAR | Status: DC | PRN
  Administered 2019-01-17: 20 mL via PERINEURAL

## 2019-01-17 MED ORDER — PROPOFOL 10 MG/ML IV BOLUS
INTRAVENOUS | Status: AC
  Filled 2019-01-17: qty 60

## 2019-01-17 MED ORDER — BUPIVACAINE-EPINEPHRINE (PF) 0.25% -1:200000 IJ SOLN
INTRAMUSCULAR | Status: DC | PRN
  Administered 2019-01-17: 30 mL

## 2019-01-17 MED ORDER — CEFAZOLIN SODIUM-DEXTROSE 2-4 GM/100ML-% IV SOLN
2.0000 g | INTRAVENOUS | Status: AC
  Administered 2019-01-17: 2 g via INTRAVENOUS
  Filled 2019-01-17: qty 100

## 2019-01-17 MED ORDER — BUPIVACAINE-EPINEPHRINE (PF) 0.25% -1:200000 IJ SOLN
INTRAMUSCULAR | Status: AC
  Filled 2019-01-17: qty 30

## 2019-01-17 MED ORDER — DOCUSATE SODIUM 100 MG PO CAPS
100.0000 mg | ORAL_CAPSULE | Freq: Two times a day (BID) | ORAL | Status: DC
  Administered 2019-01-17 – 2019-01-18 (×2): 100 mg via ORAL
  Filled 2019-01-17 (×2): qty 1

## 2019-01-17 MED ORDER — PROPOFOL 10 MG/ML IV BOLUS
INTRAVENOUS | Status: AC
  Filled 2019-01-17: qty 20

## 2019-01-17 MED ORDER — ONDANSETRON HCL 4 MG/2ML IJ SOLN
INTRAMUSCULAR | Status: DC | PRN
  Administered 2019-01-17: 4 mg via INTRAVENOUS

## 2019-01-17 MED ORDER — PHENOL 1.4 % MT LIQD: 1.0000 | OROMUCOSAL | Status: DC | PRN

## 2019-01-17 MED ORDER — BUPIVACAINE LIPOSOME 1.3 % IJ SUSP
INTRAMUSCULAR | Status: DC | PRN
  Administered 2019-01-17: 20 mL

## 2019-01-17 MED ORDER — MIDAZOLAM HCL 2 MG/2ML IJ SOLN
INTRAMUSCULAR | Status: DC | PRN
  Administered 2019-01-17 (×2): 1 mg via INTRAVENOUS

## 2019-01-17 MED ORDER — CEFUROXIME SODIUM 1.5 G IV SOLR
INTRAVENOUS | Status: AC
  Filled 2019-01-17: qty 1.5

## 2019-01-17 MED ORDER — ACETAMINOPHEN 500 MG PO TABS
1000.0000 mg | ORAL_TABLET | Freq: Four times a day (QID) | ORAL | Status: AC
  Administered 2019-01-17 – 2019-01-18 (×4): 1000 mg via ORAL
  Filled 2019-01-17 (×4): qty 2

## 2019-01-17 MED ORDER — MENTHOL 3 MG MT LOZG: 1.0000 | LOZENGE | OROMUCOSAL | Status: DC | PRN

## 2019-01-17 MED ORDER — FENTANYL CITRATE (PF) 100 MCG/2ML IJ SOLN
INTRAMUSCULAR | Status: AC
  Filled 2019-01-17: qty 2

## 2019-01-17 MED ORDER — DEXAMETHASONE SODIUM PHOSPHATE 10 MG/ML IJ SOLN
8.0000 mg | Freq: Once | INTRAMUSCULAR | Status: AC
  Administered 2019-01-17: 8 mg via INTRAVENOUS

## 2019-01-17 MED ORDER — CHLORHEXIDINE GLUCONATE 4 % EX LIQD: 60.0000 mL | Freq: Once | CUTANEOUS | Status: DC

## 2019-01-17 MED ORDER — FLUTICASONE PROPIONATE 50 MCG/ACT NA SUSP
2.0000 | Freq: Every day | NASAL | Status: DC
  Administered 2019-01-17 – 2019-01-18 (×2): 2 via NASAL
  Filled 2019-01-17: qty 16

## 2019-01-17 MED ORDER — ONDANSETRON HCL 4 MG PO TABS: 4.0000 mg | ORAL_TABLET | Freq: Four times a day (QID) | ORAL | Status: DC | PRN

## 2019-01-17 MED ORDER — EPHEDRINE 5 MG/ML INJ
INTRAVENOUS | Status: AC
  Filled 2019-01-17: qty 10

## 2019-01-17 MED ORDER — OXYCODONE HCL 5 MG/5ML PO SOLN: 5.0000 mg | Freq: Once | ORAL | Status: DC | PRN

## 2019-01-17 MED ORDER — DIPHENHYDRAMINE HCL 12.5 MG/5ML PO ELIX: 12.5000 mg | ORAL_SOLUTION | ORAL | Status: DC | PRN

## 2019-01-17 MED ORDER — FENOFIBRATE 160 MG PO TABS
160.0000 mg | ORAL_TABLET | Freq: Every day | ORAL | Status: DC
  Administered 2019-01-18: 160 mg via ORAL
  Filled 2019-01-17: qty 1

## 2019-01-17 MED ORDER — POLYETHYLENE GLYCOL 3350 17 G PO PACK
17.0000 g | PACK | Freq: Two times a day (BID) | ORAL | Status: DC
  Administered 2019-01-18: 17 g via ORAL
  Filled 2019-01-17 (×2): qty 1

## 2019-01-17 MED ORDER — EPHEDRINE SULFATE-NACL 50-0.9 MG/10ML-% IV SOSY
PREFILLED_SYRINGE | INTRAVENOUS | Status: DC | PRN
  Administered 2019-01-17 (×2): 5 mg via INTRAVENOUS

## 2019-01-17 MED ORDER — ALUM & MAG HYDROXIDE-SIMETH 200-200-20 MG/5ML PO SUSP: 30.0000 mL | ORAL | Status: DC | PRN

## 2019-01-17 MED ORDER — LACTATED RINGERS IV SOLN
INTRAVENOUS | Status: DC
  Administered 2019-01-17: 06:00:00 via INTRAVENOUS

## 2019-01-17 MED ORDER — BUPIVACAINE IN DEXTROSE 0.75-8.25 % IT SOLN
INTRATHECAL | Status: DC | PRN
  Administered 2019-01-17: 1.8 mL via INTRATHECAL

## 2019-01-17 MED ORDER — METOCLOPRAMIDE HCL 5 MG/ML IJ SOLN
5.0000 mg | Freq: Three times a day (TID) | INTRAMUSCULAR | Status: DC | PRN
  Administered 2019-01-17 – 2019-01-18 (×2): 10 mg via INTRAVENOUS
  Filled 2019-01-17 (×2): qty 2

## 2019-01-17 MED ORDER — PROMETHAZINE HCL 25 MG/ML IJ SOLN: 6.2500 mg | INTRAMUSCULAR | Status: DC | PRN

## 2019-01-17 MED ORDER — TRANEXAMIC ACID-NACL 1000-0.7 MG/100ML-% IV SOLN
1000.0000 mg | INTRAVENOUS | Status: AC
  Administered 2019-01-17: 1000 mg via INTRAVENOUS
  Filled 2019-01-17: qty 100

## 2019-01-17 MED ORDER — GABAPENTIN 300 MG PO CAPS
300.0000 mg | ORAL_CAPSULE | Freq: Every day | ORAL | Status: DC
  Administered 2019-01-17: 300 mg via ORAL
  Filled 2019-01-17: qty 1

## 2019-01-17 MED ORDER — SODIUM CHLORIDE 0.9 % IR SOLN
Status: DC | PRN
  Administered 2019-01-17: 1000 mL

## 2019-01-17 MED ORDER — POTASSIUM CHLORIDE IN NACL 20-0.9 MEQ/L-% IV SOLN
INTRAVENOUS | Status: DC
  Administered 2019-01-17: 13:00:00 via INTRAVENOUS
  Filled 2019-01-17 (×2): qty 1000

## 2019-01-17 MED ORDER — CEFAZOLIN SODIUM-DEXTROSE 2-4 GM/100ML-% IV SOLN
2.0000 g | Freq: Four times a day (QID) | INTRAVENOUS | Status: AC
  Administered 2019-01-17 (×2): 2 g via INTRAVENOUS
  Filled 2019-01-17 (×2): qty 100

## 2019-01-17 MED ORDER — OXYCODONE HCL 5 MG PO TABS
5.0000 mg | ORAL_TABLET | ORAL | Status: DC | PRN
  Administered 2019-01-17: 5 mg via ORAL
  Administered 2019-01-17: 10 mg via ORAL
  Administered 2019-01-17: 5 mg via ORAL
  Administered 2019-01-17 – 2019-01-18 (×4): 10 mg via ORAL
  Filled 2019-01-17 (×2): qty 1
  Filled 2019-01-17 (×5): qty 2

## 2019-01-17 MED ORDER — INSULIN ASPART 100 UNIT/ML ~~LOC~~ SOLN: 0.0000 [IU] | Freq: Every day | SUBCUTANEOUS | Status: DC

## 2019-01-17 MED ORDER — POVIDONE-IODINE 7.5 % EX SOLN: Freq: Once | CUTANEOUS | Status: DC

## 2019-01-17 MED ORDER — FENTANYL CITRATE (PF) 100 MCG/2ML IJ SOLN
INTRAMUSCULAR | Status: DC | PRN
  Administered 2019-01-17 (×2): 50 ug via INTRAVENOUS

## 2019-01-17 MED ORDER — INSULIN ASPART 100 UNIT/ML ~~LOC~~ SOLN: 0.0000 [IU] | Freq: Three times a day (TID) | SUBCUTANEOUS | Status: DC

## 2019-01-17 MED ORDER — MIDAZOLAM HCL 2 MG/2ML IJ SOLN
INTRAMUSCULAR | Status: AC
  Filled 2019-01-17: qty 2

## 2019-01-17 MED ORDER — ONDANSETRON HCL 4 MG/2ML IJ SOLN
4.0000 mg | Freq: Four times a day (QID) | INTRAMUSCULAR | Status: DC | PRN
  Administered 2019-01-17 – 2019-01-18 (×3): 4 mg via INTRAVENOUS
  Filled 2019-01-17 (×3): qty 2

## 2019-01-17 MED ORDER — PROPOFOL 500 MG/50ML IV EMUL
INTRAVENOUS | Status: DC | PRN
  Administered 2019-01-17: 25 ug/kg/min via INTRAVENOUS

## 2019-01-17 MED ORDER — PHENYLEPHRINE 40 MCG/ML (10ML) SYRINGE FOR IV PUSH (FOR BLOOD PRESSURE SUPPORT)
PREFILLED_SYRINGE | INTRAVENOUS | Status: DC | PRN
  Administered 2019-01-17 (×9): 40 ug via INTRAVENOUS

## 2019-01-17 MED ORDER — HYDROMORPHONE HCL 1 MG/ML IJ SOLN: 0.2500 mg | INTRAMUSCULAR | Status: DC | PRN

## 2019-01-17 MED ORDER — ONDANSETRON HCL 4 MG/2ML IJ SOLN
INTRAMUSCULAR | Status: AC
  Filled 2019-01-17: qty 2

## 2019-01-17 MED ORDER — LIDOCAINE 2% (20 MG/ML) 5 ML SYRINGE
INTRAMUSCULAR | Status: AC
  Filled 2019-01-17: qty 5

## 2019-01-17 MED ORDER — HYDROMORPHONE HCL 1 MG/ML IJ SOLN
0.5000 mg | INTRAMUSCULAR | Status: DC | PRN
  Administered 2019-01-17: 0.5 mg via INTRAVENOUS
  Filled 2019-01-17: qty 1

## 2019-01-17 MED ORDER — ASPIRIN EC 325 MG PO TBEC
325.0000 mg | DELAYED_RELEASE_TABLET | Freq: Every day | ORAL | Status: DC
  Administered 2019-01-18: 325 mg via ORAL
  Filled 2019-01-17: qty 1

## 2019-01-17 MED ORDER — METOCLOPRAMIDE HCL 5 MG PO TABS: 5.0000 mg | ORAL_TABLET | Freq: Three times a day (TID) | ORAL | Status: DC | PRN

## 2019-01-17 MED ORDER — CEFUROXIME SODIUM 1.5 G IV SOLR
INTRAVENOUS | Status: DC | PRN
  Administered 2019-01-17: 1.5 g via INTRAVENOUS

## 2019-01-17 MED ORDER — DEXAMETHASONE SODIUM PHOSPHATE 10 MG/ML IJ SOLN
10.0000 mg | Freq: Three times a day (TID) | INTRAMUSCULAR | Status: AC
  Administered 2019-01-17 – 2019-01-18 (×3): 10 mg via INTRAVENOUS
  Filled 2019-01-17 (×3): qty 1

## 2019-01-17 MED ORDER — SODIUM CHLORIDE (PF) 0.9 % IJ SOLN
INTRAMUSCULAR | Status: AC
  Filled 2019-01-17: qty 50

## 2019-01-17 MED ORDER — PHENYLEPHRINE 40 MCG/ML (10ML) SYRINGE FOR IV PUSH (FOR BLOOD PRESSURE SUPPORT)
PREFILLED_SYRINGE | INTRAVENOUS | Status: AC
  Filled 2019-01-17: qty 10

## 2019-01-17 MED ORDER — INSULIN GLARGINE 100 UNIT/ML ~~LOC~~ SOLN
15.0000 [IU] | Freq: Every day | SUBCUTANEOUS | Status: DC
  Filled 2019-01-17 (×2): qty 0.15

## 2019-01-17 MED ORDER — 0.9 % SODIUM CHLORIDE (POUR BTL) OPTIME
TOPICAL | Status: DC | PRN
  Administered 2019-01-17: 1000 mL

## 2019-01-17 MED ORDER — WATER FOR IRRIGATION, STERILE IR SOLN
Status: DC | PRN
  Administered 2019-01-17: 2000 mL

## 2019-01-17 MED ORDER — DEXAMETHASONE SODIUM PHOSPHATE 10 MG/ML IJ SOLN
INTRAMUSCULAR | Status: AC
  Filled 2019-01-17: qty 1

## 2019-01-17 MED ORDER — SODIUM CHLORIDE 0.9 % IJ SOLN
INTRAMUSCULAR | Status: DC | PRN
  Administered 2019-01-17: 50 mL

## 2019-01-17 MED ORDER — DIPHENHYDRAMINE HCL 25 MG PO CAPS: 25.0000 mg | ORAL_CAPSULE | Freq: Two times a day (BID) | ORAL | Status: DC | PRN

## 2019-01-17 MED ORDER — OXYCODONE HCL 5 MG PO TABS: 5.0000 mg | ORAL_TABLET | Freq: Once | ORAL | Status: DC | PRN

## 2019-01-17 SURGICAL SUPPLY — 58 items
APL PRP STRL LF DISP 70% ISPRP (MISCELLANEOUS) ×2
BAG SPEC THK2 15X12 ZIP CLS (MISCELLANEOUS) ×1
BAG ZIPLOCK 12X15 (MISCELLANEOUS) ×2 IMPLANT
BANDAGE ACE 6X5 VEL STRL LF (GAUZE/BANDAGES/DRESSINGS) ×2 IMPLANT
BANDAGE ELASTIC 6 VELCRO ST LF (GAUZE/BANDAGES/DRESSINGS) ×1 IMPLANT
BASEPLATE TIBIAL RT SZ2 (Knees) ×1 IMPLANT
BLADE SAGITTAL 25.0X1.19X90 (BLADE) ×2 IMPLANT
BLADE SAW SGTL 13X75X1.27 (BLADE) ×2 IMPLANT
BOWL SMART MIX CTS (DISPOSABLE) ×2 IMPLANT
BSPLAT TIB 2 CMNT M TPR KN RT (Knees) ×1 IMPLANT
CEMENT HV SMART SET (Cement) ×4 IMPLANT
CHLORAPREP W/TINT 26 (MISCELLANEOUS) ×4 IMPLANT
COVER SURGICAL LIGHT HANDLE (MISCELLANEOUS) ×2 IMPLANT
COVER WAND RF STERILE (DRAPES) IMPLANT
CUFF TOURN SGL QUICK 34 (TOURNIQUET CUFF) ×2
CUFF TRNQT CYL 34X4.125X (TOURNIQUET CUFF) ×1 IMPLANT
DECANTER SPIKE VIAL GLASS SM (MISCELLANEOUS) ×4 IMPLANT
DRAPE ORTHO SPLIT 77X108 STRL (DRAPES) ×4
DRAPE SHEET LG 3/4 BI-LAMINATE (DRAPES) ×2 IMPLANT
DRAPE SURG ORHT 6 SPLT 77X108 (DRAPES) ×2 IMPLANT
DRAPE U-SHAPE 47X51 STRL (DRAPES) ×2 IMPLANT
DRSG AQUACEL AG ADV 3.5X10 (GAUZE/BANDAGES/DRESSINGS) ×2 IMPLANT
ELECT REM PT RETURN 15FT ADLT (MISCELLANEOUS) ×2 IMPLANT
FEMORAL GENESIS PRIM LUGS (Orthopedic Implant) ×2 IMPLANT
GLOVE BIO SURGEON STRL SZ7 (GLOVE) ×3 IMPLANT
GLOVE BIOGEL PI IND STRL 7.0 (GLOVE) ×1 IMPLANT
GLOVE BIOGEL PI IND STRL 7.5 (GLOVE) ×1 IMPLANT
GLOVE BIOGEL PI INDICATOR 7.0 (GLOVE) ×1
GLOVE BIOGEL PI INDICATOR 7.5 (GLOVE) ×1
GLOVE ECLIPSE 7.5 STRL STRAW (GLOVE) ×2 IMPLANT
GLOVE SS BIOGEL STRL SZ 7.5 (GLOVE) ×2 IMPLANT
GLOVE SUPERSENSE BIOGEL SZ 7.5 (GLOVE) ×2
GOWN STRL REUS W/ TWL LRG LVL3 (GOWN DISPOSABLE) ×1 IMPLANT
GOWN STRL REUS W/ TWL XL LVL3 (GOWN DISPOSABLE) ×1 IMPLANT
GOWN STRL REUS W/TWL LRG LVL3 (GOWN DISPOSABLE) ×2
GOWN STRL REUS W/TWL XL LVL3 (GOWN DISPOSABLE) ×2
HANDPIECE INTERPULSE COAX TIP (DISPOSABLE) ×2
HOLDER FOLEY CATH W/STRAP (MISCELLANEOUS) IMPLANT
INSERT XLPE 11MM (Knees) ×1 IMPLANT
KIT TURNOVER KIT A (KITS) IMPLANT
KNEE FEMORAL COMP POST SZ3 RT (Knees) ×1 IMPLANT
MANIFOLD NEPTUNE II (INSTRUMENTS) ×2 IMPLANT
MARKER SKIN DUAL TIP RULER LAB (MISCELLANEOUS) ×2 IMPLANT
NS IRRIG 1000ML POUR BTL (IV SOLUTION) ×2 IMPLANT
PACK TOTAL KNEE CUSTOM (KITS) ×2 IMPLANT
PATELLA 29MM (Knees) ×1 IMPLANT
PROTECTOR NERVE ULNAR (MISCELLANEOUS) ×4 IMPLANT
SET HNDPC FAN SPRY TIP SCT (DISPOSABLE) ×1 IMPLANT
STAPLER VISISTAT 35W (STAPLE) IMPLANT
STRIP CLOSURE SKIN 1/2X4 (GAUZE/BANDAGES/DRESSINGS) ×2 IMPLANT
SUT MNCRL AB 3-0 PS2 18 (SUTURE) ×2 IMPLANT
SUT VIC AB 0 CT1 36 (SUTURE) ×4 IMPLANT
SUT VIC AB 1 CT1 36 (SUTURE) ×2 IMPLANT
SUT VIC AB 2-0 CT1 27 (SUTURE) ×4
SUT VIC AB 2-0 CT1 TAPERPNT 27 (SUTURE) ×2 IMPLANT
SYR CONTROL 10ML LL (SYRINGE) ×4 IMPLANT
TRAY FOLEY CATH 14FR (SET/KITS/TRAYS/PACK) ×1 IMPLANT
WATER STERILE IRR 1000ML POUR (IV SOLUTION) ×2 IMPLANT

## 2019-01-17 NOTE — Interval H&P Note (Signed)
History and Physical Interval Note:  01/17/2019 6:21 AM  Katherine Oliver  has presented today for surgery, with the diagnosis of djd right knee.  The various methods of treatment have been discussed with the patient and family. After consideration of risks, benefits and other options for treatment, the patient has consented to  Procedure(s): TOTAL KNEE ARTHROPLASTY (Right) as a surgical intervention.  The patient's history has been reviewed, patient examined, no change in status, stable for surgery.  I have reviewed the patient's chart and labs.  Questions were answered to the patient's satisfaction.     Lorn Junes

## 2019-01-17 NOTE — Op Note (Signed)
MRN:     [id number] DOB/AGE:    [birth year] / 75 y.o.       OPERATIVE REPORT   DATE OF PROCEDURE:  01/17/2019      PREOPERATIVE DIAGNOSIS:   Primary Localized Osteoarthritis right Knee       Estimated body mass index is 34.39 kg/m as calculated from the following:   Height as of this encounter: 5\' 1"  (1.549 m).   Weight as of this encounter: 82.6 kg.                                                       POSTOPERATIVE DIAGNOSIS:   Same                                                                 PROCEDURE:  Procedure(s): TOTAL KNEE ARTHROPLASTY Using Smith Nephew Oxinium implants #3 Femur, #2Tibia, 39mm  bearing, 29 Patella    SURGEON: Deric Bocock A. Noemi Chapel, MD   ASSISTANT: Matthew Saras, PA-C, present and scrubbed throughout the case, critical for retraction, instrumentation, and closure.  ANESTHESIA: Spinal with Adductor Nerve Block  TOURNIQUET TIME: 70 minutes   COMPLICATIONS:  None       SPECIMENS: None   INDICATIONS FOR PROCEDURE: The patient has djd of the knee with varus deformities, XR shows bone on bone arthritis. Patient has failed all conservative measures including anti-inflammatory medicines, narcotics, attempts at exercise and weight loss, cortisone injections and viscosupplementation.  Risks and benefits of surgery have been discussed, questions answered.    DESCRIPTION OF PROCEDURE: The patient identified by armband, received right adductor canal block and IV antibiotics, in the holding area at [facility]. Patient taken to the operating room, appropriate anesthetic monitors were attached. Spinal anesthesia induced with the patient in supine position, Foley catheter was inserted. Tourniquet applied high to the operative thigh. Lateral post and foot positioner applied to the table, the lower extremity was then prepped and draped in usual sterile fashion from the ankle to the tourniquet. Time-out procedure was performed. The limb was wrapped with an Esmarch  bandage and the tourniquet inflated to 365 mmHg.   We began the operation by making a 6cm anterior midline incision. Small bleeders in the skin and the subcutaneous tissue identified and cauterized. Transverse retinaculum was incised and reflected medially and a medial parapatellar arthrotomy was accomplished. the patella was everted and theprepatellar fat pad resected. The superficial medial collateral ligament was then elevated from anterior to posterior along the proximal flare of the tibia and anterior half of the menisci resected. The knee was hyperflexed exposing bone on bone arthritis. Peripheral and notch osteophytes as well as the cruciate ligaments were then resected. We continued to work our way around posteriorly along the proximal tibia, and externally rotated the tibia subluxing it out from underneath the femur. A McHale retractor was placed through the notch and a lateral Hohmann retractor placed, and an external tibial guide was placed.  The tibial cutting guide was pinned into place allowing resection of 4 mm of bone medially and about 6 mm of bone laterally because of her varus  deformity.   Satisfied with the tibial resection, we then entered the distal femur 2 mm anterior to the PCL origin with the intramedullary guide rod and applied the distal femoral cutting guide set at 89mm, with 5 degrees of valgus. This was pinned along the epicondylar axis. At this point, the distal femoral cut was accomplished without difficulty. We then sized for a 3 femoral component and pinned the guide in 3 degrees of external rotation.The chamfer cutting guide was pinned into place. The anterior, posterior, and chamfer cuts were accomplished without difficulty followed by the  RP box cutting guide and the box cut. We also removed posterior osteophytes from the posterior femoral condyles. At this time, the knee was brought into full extension. We checked our extension and flexion gaps and found them symmetric at  11.  The patella thickness measured at 57m m. We set the cutting guide at 13 and removed the posterior patella sized for 29 button and drilled the lollipop. The knee was then once again hyperflexed exposing the proximal tibia. We sized for a # 2 tibial base plate, applied the smokestack and the conical reamer followed by the the Delta fin keel punch. We then hammered into place the  RP trial femoral component, inserted a trial bearing, trial patellar button, and took the knee through range of motion from 0-130 degrees. No thumb pressure was required for patellar tracking.   At this point, all trial components were removed, a double batch of DePuy HV cement with Zinecef 1.5 gms  was mixed and applied to all bony metallic mating surfaces. In order, we hammered into place the tibial tray and removed excess cement, the femoral component and removed excess cement, a 11 mm  bearing was inserted, and the knee brought to full extension with compression. The patellar button was clamped into place, and excess cement removed. While the cement cured the wound was irrigated out with normal saline solution pulse lavage, and exparel was injected throughout the knee. Ligament stability and patellar tracking were checked and found to be excellent..   The parapatellar arthrotomy was closed with  #1 Vicryl suture. The subcutaneous tissue with 0 and 2-0 undyed Vicryl suture, and 4-0 Monocryl.. A dressing of Aquaseal, 4 x 4, dressing sponges, Webril, and Ace wrap applied. Needle and sponge count were correct times 2.The patient awakened, extubated, and taken to recovery room without difficulty. Vascular status was normal, pulses 2+ and symmetric.    Lorn Junes 01/25/2018, 8:56 AM

## 2019-01-17 NOTE — Anesthesia Procedure Notes (Signed)
Procedure Name: MAC Date/Time: 01/17/2019 7:13 AM Performed by: Eben Burow, CRNA Pre-anesthesia Checklist: Patient identified, Emergency Drugs available, Suction available, Patient being monitored and Timeout performed Oxygen Delivery Method: Simple face mask Dental Injury: Teeth and Oropharynx as per pre-operative assessment

## 2019-01-17 NOTE — Evaluation (Signed)
Physical Therapy Evaluation Patient Details Name: Nayda Riesen MRN: [id number] DOB: 02-29-1946 Today's Date: 01/17/2019   History of Present Illness  RTKA  Clinical Impression  The patient  Mobilized to recliner, ambulating a short distance. Plans Dc to home with HHPT. Pt admitted with above diagnosis. Pt currently with functional limitations due to the deficits listed below (see PT Problem List).  Pt will benefit from skilled PT to increase their independence and safety with mobility to allow discharge to the venue listed below.       Follow Up Recommendations Home health PT    Equipment Recommendations  None recommended by PT    Recommendations for Other Services       Precautions / Restrictions Precautions Precautions: Knee;Fall      Mobility  Bed Mobility Overal bed mobility: Needs Assistance Bed Mobility: Supine to Sit     Supine to sit: Min assist     General bed mobility comments: support trunk and right leg  Transfers Overall transfer level: Needs assistance Equipment used: Rolling walker (2 wheeled) Transfers: Sit to/from Stand Sit to Stand: Min assist Stand pivot transfers: Min assist       General transfer comment: cues for sequence and posture  Ambulation/Gait Ambulation/Gait assistance: Min assist Gait Distance (Feet): 5 Feet Assistive device: Rolling walker (2 wheeled) Gait Pattern/deviations: Step-to pattern     General Gait Details: cues for sequence  Stairs            Wheelchair Mobility    Modified Rankin (Stroke Patients Only)       Balance                                             Pertinent Vitals/Pain Pain Assessment: 0-10 Pain Score: 7  Pain Location: right knee Pain Descriptors / Indicators: Aching;Discomfort Pain Intervention(s): Premedicated before session;Monitored during session;Limited activity within patient's tolerance;Ice applied    Home Living Family/patient expects to  be discharged to:: Private residence Living Arrangements: Spouse/significant other Available Help at Discharge: Family Type of Home: House Home Access: Stairs to enter Entrance Stairs-Rails: None Entrance Stairs-Number of Steps: 1 x 2 Home Layout: Two level;Able to live on main level with bedroom/bathroom Home Equipment: Gilford Rile - 2 wheels      Prior Function Level of Independence: Independent               Hand Dominance        Extremity/Trunk Assessment   Upper Extremity Assessment Upper Extremity Assessment: Defer to OT evaluation    Lower Extremity Assessment Lower Extremity Assessment: RLE deficits/detail RLE Deficits / Details: knee flex5-40, + SLR    Cervical / Trunk Assessment Cervical / Trunk Assessment: Normal  Communication   Communication: No difficulties  Cognition Arousal/Alertness: Awake/alert Behavior During Therapy: WFL for tasks assessed/performed Overall Cognitive Status: Within Functional Limits for tasks assessed                                        General Comments      Exercises Total Joint Exercises Ankle Circles/Pumps: AROM;10 reps;Left Quad Sets: AROM;10 reps;Left   Assessment/Plan    PT Assessment Patient needs continued PT services  PT Problem List Decreased strength;Decreased range of motion;Decreased activity tolerance;Decreased knowledge of use of DME;Decreased mobility;Decreased safety awareness;Decreased  knowledge of precautions;Pain       PT Treatment Interventions DME instruction;Therapeutic exercise;Gait training;Stair training;Functional mobility training;Therapeutic activities;Patient/family education    PT Goals (Current goals can be found in the Care Plan section)  Acute Rehab PT Goals Patient Stated Goal: to go home PT Goal Formulation: With patient/family Time For Goal Achievement: 01/24/19 Potential to Achieve Goals: Good    Frequency 7X/week   Barriers to discharge         Co-evaluation               AM-PAC PT "6 Clicks" Mobility  Outcome Measure Help needed turning from your back to your side while in a flat bed without using bedrails?: A Little Help needed moving from lying on your back to sitting on the side of a flat bed without using bedrails?: A Little Help needed moving to and from a bed to a chair (including a wheelchair)?: A Lot Help needed standing up from a chair using your arms (e.g., wheelchair or bedside chair)?: A Lot Help needed to walk in hospital room?: A Lot Help needed climbing 3-5 steps with a railing? : Total 6 Click Score: 13    End of Session Equipment Utilized During Treatment: Gait belt Activity Tolerance: Patient tolerated treatment well Patient left: in chair;with call bell/phone within reach;with family/visitor present;with chair alarm set Nurse Communication: Mobility status PT Visit Diagnosis: Unsteadiness on feet (R26.81)    Time: 8403-7543 PT Time Calculation (min) (ACUTE ONLY): 37 min   Charges:   PT Evaluation $PT Eval Low Complexity: 1 Low PT Treatments $Gait Training: 8-22 mins        [place] Pager [phone] Office [phone]   Claretha Cooper 01/17/2019, 4:41 PM

## 2019-01-17 NOTE — Anesthesia Postprocedure Evaluation (Signed)
Anesthesia Post Note  Patient: Katherine Oliver  Procedure(s) Performed: TOTAL KNEE ARTHROPLASTY (Right Knee)     Patient location during evaluation: PACU Anesthesia Type: Spinal Level of consciousness: oriented and awake and alert Pain management: pain level controlled Vital Signs Assessment: post-procedure vital signs reviewed and stable Respiratory status: spontaneous breathing and respiratory function stable Cardiovascular status: blood pressure returned to baseline and stable Postop Assessment: no headache, no backache and no apparent nausea or vomiting Anesthetic complications: no    Last Vitals:  Vitals:   01/17/19 1045 01/17/19 1102  BP: 124/70 120/85  Pulse: 96 98  Resp: (!) 21 16  Temp: 36.8 C 36.6 C  SpO2: 92% 95%    Last Pain:  Vitals:   01/17/19 1115  TempSrc:   PainSc: 0-No pain                 Lynda Rainwater

## 2019-01-17 NOTE — Care Plan (Signed)
Ortho Bundle Case Management Note  Patient Details  Name: Katherine Oliver MRN: [id number] Date of Birth: 02/06/1946      Met with patient and husband in the office prior to surgery. She will discharge to home with family and HHPT. Equipment has been ordered to be delivered to home with CPM if possible. She is aware of plan and agreeable. Choice offered.                DME Arranged:  Walker rolling, CPM, Bedside commode DME Agency:  [employer]  HH Arranged:  PT [place] Agency:  [facility]  Additional Comments: Please contact me with any questions of if this plan should need to change.  Ladell Heads,  [place] Specialist  [phone] 01/17/2019, 8:10 AM

## 2019-01-17 NOTE — Transfer of Care (Signed)
Immediate Anesthesia Transfer of Care Note  Patient: Katherine Oliver  Procedure(s) Performed: TOTAL KNEE ARTHROPLASTY (Right Knee)  Patient Location: PACU  Anesthesia Type:Spinal  Level of Consciousness: awake, alert  and oriented  Airway & Oxygen Therapy: Patient Spontanous Breathing and Patient connected to face mask oxygen  Post-op Assessment: Report given to RN and Post -op Vital signs reviewed and stable  Post vital signs: Reviewed and stable  Last Vitals:  Vitals Value Taken Time  BP 113/64 01/17/2019  9:34 AM  Temp    Pulse 100 01/17/2019  9:37 AM  Resp 22 01/17/2019  9:37 AM  SpO2 98 % 01/17/2019  9:37 AM  Vitals shown include unvalidated device data.  Last Pain:  Vitals:   01/17/19 0603  TempSrc:   PainSc: 0-No pain         Complications: No apparent anesthesia complications

## 2019-01-17 NOTE — Anesthesia Procedure Notes (Signed)
Anesthesia Regional Block: Adductor canal block   Pre-Anesthetic Checklist: ,, timeout performed, Correct Patient, Correct Site, Correct Laterality, Correct Procedure, Correct Position, site marked, Risks and benefits discussed,  Surgical consent,  Pre-op evaluation,  At surgeon's request and post-op pain management  Laterality: Right  Prep: chloraprep       Needles:  Injection technique: Single-shot  Needle Type: Stimiplex     Needle Length: 9cm  Needle Gauge: 21     Additional Needles:   Procedures:,,,, ultrasound used (permanent image in chart),,,,  Narrative:  Start time: 01/17/2019 7:07 AM End time: 01/17/2019 7:12 AM Injection made incrementally with aspirations every 5 mL.  Performed by: Personally  Anesthesiologist: Lynda Rainwater, MD

## 2019-01-17 NOTE — Anesthesia Procedure Notes (Signed)
Spinal  Patient location during procedure: OR Start time: 01/17/2019 7:19 AM End time: 01/17/2019 7:24 AM Staffing Anesthesiologist: Lynda Rainwater, MD Performed: anesthesiologist  Preanesthetic Checklist Completed: patient identified, site marked, surgical consent, pre-op evaluation, timeout performed, IV checked, risks and benefits discussed and monitors and equipment checked Spinal Block Patient position: sitting Prep: DuraPrep Patient monitoring: heart rate, cardiac monitor, continuous pulse ox and blood pressure Approach: midline Location: L3-4 Injection technique: single-shot Needle Needle type: Pencan  Needle gauge: 22 G Needle length: 9 cm Assessment Sensory level: T4

## 2019-01-18 DIAGNOSIS — E1122 Type 2 diabetes mellitus with diabetic chronic kidney disease: Secondary | ICD-10-CM | POA: Diagnosis not present

## 2019-01-18 DIAGNOSIS — M1711 Unilateral primary osteoarthritis, right knee: Secondary | ICD-10-CM | POA: Diagnosis not present

## 2019-01-18 DIAGNOSIS — Z87440 Personal history of urinary (tract) infections: Secondary | ICD-10-CM | POA: Diagnosis not present

## 2019-01-18 DIAGNOSIS — K219 Gastro-esophageal reflux disease without esophagitis: Secondary | ICD-10-CM | POA: Diagnosis not present

## 2019-01-18 DIAGNOSIS — I129 Hypertensive chronic kidney disease with stage 1 through stage 4 chronic kidney disease, or unspecified chronic kidney disease: Secondary | ICD-10-CM | POA: Diagnosis not present

## 2019-01-18 DIAGNOSIS — N183 Chronic kidney disease, stage 3 (moderate): Secondary | ICD-10-CM | POA: Diagnosis not present

## 2019-01-18 LAB — CBC
HCT: 37.8 % (ref 36.0–46.0)
Hemoglobin: 11.6 g/dL — ABNORMAL LOW (ref 12.0–15.0)
MCH: 27.1 pg (ref 26.0–34.0)
MCHC: 30.7 g/dL (ref 30.0–36.0)
MCV: 88.3 fL (ref 80.0–100.0)
PLATELETS: 204 10*3/uL (ref 150–400)
RBC: 4.28 MIL/uL (ref 3.87–5.11)
RDW: 13.5 % (ref 11.5–15.5)
WBC: 11.7 10*3/uL — ABNORMAL HIGH (ref 4.0–10.5)
nRBC: 0 % (ref 0.0–0.2)

## 2019-01-18 LAB — BASIC METABOLIC PANEL
Anion gap: 8 (ref 5–15)
BUN: 20 mg/dL (ref 8–23)
CO2: 22 mmol/L (ref 22–32)
Calcium: 9.2 mg/dL (ref 8.9–10.3)
Chloride: 109 mmol/L (ref 98–111)
Creatinine, Ser: 0.85 mg/dL (ref 0.44–1.00)
GFR calc Af Amer: >60 mL/min (ref >60)
Glucose, Bld: 155 mg/dL — ABNORMAL HIGH (ref 70–99)
Potassium: 4.6 mmol/L (ref 3.5–5.1)
Sodium: 139 mmol/L (ref 135–145)

## 2019-01-18 LAB — URINE CULTURE: Culture: NO GROWTH

## 2019-01-18 LAB — GLUCOSE, CAPILLARY
Glucose-Capillary: 143 mg/dL — ABNORMAL HIGH (ref 70–99)
Glucose-Capillary: 186 mg/dL — ABNORMAL HIGH (ref 70–99)

## 2019-01-18 MED ORDER — METOCLOPRAMIDE HCL 5 MG/5ML PO SOLN: 5.0000 mg | Freq: Three times a day (TID) | ORAL | 0 refills | Status: DC | PRN

## 2019-01-18 MED ORDER — OXYCODONE HCL 5 MG PO TABS: ORAL_TABLET | ORAL | 0 refills | Status: DC

## 2019-01-18 MED ORDER — ASPIRIN 325 MG PO TBEC: DELAYED_RELEASE_TABLET | ORAL | 0 refills | Status: DC

## 2019-01-18 MED ORDER — POLYETHYLENE GLYCOL 3350 17 G PO PACK: PACK | ORAL | 0 refills | Status: DC

## 2019-01-18 MED ORDER — GABAPENTIN 300 MG PO CAPS: ORAL_CAPSULE | ORAL | 0 refills | Status: DC

## 2019-01-18 MED ORDER — SODIUM CHLORIDE 0.9 % IV BOLUS
500.0000 mL | Freq: Once | INTRAVENOUS | Status: AC
  Administered 2019-01-18: 500 mL via INTRAVENOUS

## 2019-01-18 MED ORDER — DOCUSATE SODIUM 100 MG PO CAPS: ORAL_CAPSULE | ORAL | 0 refills | Status: DC

## 2019-01-18 NOTE — Discharge Summary (Addendum)
Patient ID: Katherine Oliver MRN: [id number] DOB/AGE: 1945-12-13 73 y.o.  Admit date: 01/17/2019 Discharge date: 01/18/2019  Admission Diagnoses:  Principal Problem:   Primary localized osteoarthritis of right knee Active Problems:   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 nephropathy (HCC)   HTN (hypertension)   History of recurrent UTIs   NAFLD (nonalcoholic fatty liver disease)   GERD (gastroesophageal reflux disease)   Obesity, Class I, BMI 30-34.9   CKD stage 3 due to type 2 diabetes mellitus (HCC)   Acute pain of right knee   LAFB (left anterior fascicular block)   Discharge Diagnoses:  Same  Past Medical History:  Diagnosis Date  . Allergic rhinitis   . Arthritis   . 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 nephropathy ([place])    DSME at [facility] 01/2016   . GERD (gastroesophageal reflux disease)   . Hepatic steatosis    by abd [place] 05/2012, mild transaminitis - normal iron sat and viral hep panel (2011), stable [place] 2017  . History of chicken pox   . History of measles   . History of recurrent UTIs    on chronic keflex  . HLD (hyperlipidemia)   . HTN (hypertension)   . 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 Kidney cyst, acquired 01/2016   L kidney by [place]  . Kidney stone 01/2016   L kidney by [place]  . Lung nodules 11/2013   overall stable on f/u CT 01/2016  . Osteopenia 06/2013   mild, forearm T -1.1, hip and spine WNL  . Polycythemia    mild, stable (2013)  . Primary localized osteoarthritis of right knee 01/05/2019  . Rosacea    metrogel    Surgeries: Procedure(s): TOTAL KNEE ARTHROPLASTY on 01/17/2019   Consultants:   Discharged Condition: Improved  Hospital Course: Katherine Oliver is an 73 y.o. female who was admitted 01/17/2019 for operative treatment ofPrimary localized  osteoarthritis of right knee. Patient has severe unremitting pain that affects sleep, daily activities, and work/hobbies. After pre-op clearance the patient was taken to the operating room on 01/17/2019 and underwent  Procedure(s): TOTAL KNEE ARTHROPLASTY.    Patient was given perioperative antibiotics:  Anti-infectives (From admission, onward)   Start     Dose/Rate Route Frequency Ordered Stop   01/17/19 1400  ceFAZolin (ANCEF) IVPB 2g/100 mL premix     2 g 200 mL/hr over 30 Minutes Intravenous Every 6 hours 01/17/19 1110 01/17/19 2040   01/17/19 0814  cefUROXime (ZINACEF) injection  Status:  Discontinued       As needed 01/17/19 0814 01/17/19 0932   01/17/19 0600  ceFAZolin (ANCEF) IVPB 2g/100 mL premix     2 g 200 mL/hr over 30 Minutes Intravenous On call to O.R. 01/17/19 0545 01/17/19 0740       Patient was given sequential compression devices, early ambulation, and chemoprophylaxis to prevent DVT.  Patient ambulated limited with me this am and needed to stop early due to fatigue and deconditioning.  Therapy will work with her twice today.  If she does well she will go today.  If she does not improve with PT will keep another night  Patient benefited maximally from hospital stay and there were no complications.    Recent vital signs:  Patient Vitals for the past 24 hrs:  BP  Temp Temp src Pulse Resp SpO2  01/18/19 1449 134/72 98.2 F (36.8 C) Oral 99 16 98 %  01/18/19 1015 136/62 97.7 F (36.5 C) Oral 92 14 93 %  01/18/19 0601 135/78 98.2 F (36.8 C) Oral 92 16 96 %  01/18/19 0103 134/82 98 F (36.7 C) Oral 97 15 95 %  01/17/19 2022 130/80 98.6 F (37 C) Oral 98 16 96 %  01/17/19 1810 138/77 98.1 F (36.7 C) Oral 100 14 92 %     Recent laboratory studies:  Recent Labs    01/18/19 0517  WBC 11.7*  HGB 11.6*  HCT 37.8  PLT 204  NA 139  K 4.6  CL 109  CO2 22  BUN 20  CREATININE 0.85  GLUCOSE 155*  CALCIUM 9.2     Discharge Medications:   Allergies as of  01/18/2019      Reactions   Azithromycin Itching   Okay if takes benadryl along with it   Nickel    Reaction to cheap earrings   Sulfa Antibiotics    Adhesive [tape] Rash   Paper tape - blisters      Medication List    STOP taking these medications   amLODipine 10 MG tablet Commonly known as:  NORVASC   aspirin 81 MG tablet Replaced by:  aspirin 325 MG EC tablet   benazepril 20 MG tablet Commonly known as:  LOTENSIN   hydrochlorothiazide 12.5 MG capsule Commonly known as:  MICROZIDE   naproxen sodium 220 MG tablet Commonly known as:  ALEVE     TAKE these medications   accu-chek soft touch lancets Use as instructed to check sugars once daily E11.21   acetaminophen 500 MG tablet Commonly known as:  TYLENOL Take 1,000 mg by mouth every 6 (six) hours as needed for moderate pain.   aspirin 325 MG EC tablet 1 tab a day for the next 30 days to prevent blood clots Replaces:  aspirin 81 MG tablet   CALCIUM 500/VITAMIN D PO Take 2 tablets by mouth daily.   cephALEXin 500 MG capsule Commonly known as:  KEFLEX Take 500 mg by mouth 4 (four) times daily.   diphenhydrAMINE 25 MG tablet Commonly known as:  BENADRYL Take 25 mg by mouth 2 (two) times daily as needed for allergies.   docusate sodium 100 MG capsule Commonly known as:  COLACE 1 tab 2 times a day while on narcotics.  STOOL SOFTENER   fenofibrate 160 MG tablet Take 1 tablet (160 mg total) by mouth daily.   fluticasone 50 MCG/ACT nasal spray Commonly known as:  FLONASE USE 2 SPRAYS INTO BOTH  NOSTRILS DAILY AS NEEDED What changed:    how much to take  how to take this  when to take this   gabapentin 300 MG capsule Commonly known as:  NEURONTIN 1 tablet before bed for nerve pain   glucose blood test strip Commonly known as:  Accu-Chek Aviva Use as directed to check sugars once daily E11.21   Krill Oil Omega-3 500 MG Caps Take 500 mg by mouth daily.   metFORMIN 500 MG 24 hr tablet Commonly  known as:  Glucophage XR Take 1 tablet (500 mg total) by mouth daily with breakfast.   metoCLOPramide 5 MG/5ML solution Commonly known as:  REGLAN Take 5 mLs (5 mg total) by mouth every 8 (eight) hours as needed for nausea or vomiting.   multivitamin tablet Take 1 tablet by mouth daily.   oxyCODONE 5 MG immediate release  tablet Commonly known as:  Oxy IR/ROXICODONE 1 po q 4 hrs prn pain   polyethylene glycol packet Commonly known as:  MIRALAX / GLYCOLAX 17grams in 6 oz of something to drink twice a day until bowel movement.  LAXITIVE.  Restart if two days since last bowel movement   rosuvastatin 10 MG tablet Commonly known as:  CRESTOR Take 1 tablet (10 mg total) by mouth at bedtime.            Discharge Care Instructions  (From admission, onward)         Start     Ordered   01/18/19 0000  Change dressing    Comments:  DO NOT REMOVE BANDAGE OVER SURGICAL INCISION.  [place] WHOLE LEG INCLUDING OVER THE WATERPROOF BANDAGE WITH SOAP AND WATER EVERY DAY.   01/18/19 0850          Diagnostic Studies: Dg Chest 2 View  Result Date: 12/30/2018 CLINICAL DATA:  Preop evaluation EXAM: CHEST - 2 VIEW COMPARISON:  None. FINDINGS: Heart size and mediastinal contours are within normal limits. Lungs are clear. No pleural effusion seen. No acute or suspicious osseous finding. Mild degenerative change within the slightly kyphotic thoracic spine. IMPRESSION: No active cardiopulmonary disease. No evidence of pneumonia or pulmonary edema. Electronically Signed   By: Franki Cabot M.D.   On: 12/30/2018 09:12    Disposition: Discharge disposition: 01-Home or Self Care       Discharge Instructions    CPM   Complete by:  As directed    Continuous passive motion machine (CPM):      Use the CPM from 0 to 90 for 6 hours per day.       You may break it up into 2 or 3 sessions per day.      Use CPM for 2 weeks or until you are told to stop.   Call MD / Call 911   Complete by:  As directed     If you experience chest pain or shortness of breath, CALL 911 and be transported to the hospital emergency room.  If you develope a fever above 101 F, pus (white drainage) or increased drainage or redness at the wound, or calf pain, call your surgeon's office.   Change dressing   Complete by:  As directed    DO NOT REMOVE BANDAGE OVER SURGICAL INCISION.  [place] WHOLE LEG INCLUDING OVER THE WATERPROOF BANDAGE WITH SOAP AND WATER EVERY DAY.   Constipation Prevention   Complete by:  As directed    Drink plenty of fluids.  Prune juice may be helpful.  You may use a stool softener, such as Colace (over the counter) 100 mg twice a day.  Use MiraLax (over the counter) for constipation as needed.   Diet - low sodium heart healthy   Complete by:  As directed    Discharge instructions   Complete by:  As directed    INSTRUCTIONS AFTER JOINT REPLACEMENT   ######  Hold amlodipine, benazepril and hydrochlorothiazide until Blood pressure is higher than 130/90.  Do not take until bottom number 90 or higher otherwise blood pressure will drop too low after surgery  ####  Remove items at home which could result in a fall. This includes throw rugs or furniture in walking pathways ICE to the affected joint every three hours while awake for 30 minutes at a time, for at least the first 3-5 days, and then as needed for pain and swelling.  Continue to use ice  for pain and swelling. You may notice swelling that will progress down to the foot and ankle.  This is normal after surgery.  Elevate your leg when you are not up walking on it.   Continue to use the breathing machine you got in the hospital (incentive spirometer) which will help keep your temperature down.  It is common for your temperature to cycle up and down following surgery, especially at night when you are not up moving around and exerting yourself.  The breathing machine keeps your lungs expanded and your temperature down.   DIET:  As you were doing prior  to hospitalization, we recommend a well-balanced diet.  DRESSING / WOUND CARE / SHOWERING  Keep the surgical dressing until follow up.  The dressing is water proof, so you can shower without any extra covering.  IF THE DRESSING FALLS OFF or the wound gets wet inside, change the dressing with sterile gauze.  Please use good hand washing techniques before changing the dressing.  Do not use any lotions or creams on the incision until instructed by your surgeon.    ACTIVITY  Increase activity slowly as tolerated, but follow the weight bearing instructions below.   No driving for 6 weeks or until further direction given by your physician.  You cannot drive while taking narcotics.  No lifting or carrying greater than 10 lbs. until further directed by your surgeon. Avoid periods of inactivity such as sitting longer than an hour when not asleep. This helps prevent blood clots.  You may return to work once you are authorized by your doctor.     WEIGHT BEARING   Weight bearing as tolerated with assist device (walker, cane, etc) as directed, use it as long as suggested by your surgeon or therapist, typically at least 2-3 weeks.   EXERCISES  Results after joint replacement surgery are often greatly improved when you follow the exercise, range of motion and muscle strengthening exercises prescribed by your doctor. Safety measures are also important to protect the joint from further injury. Any time any of these exercises cause you to have increased pain or swelling, decrease what you are doing until you are comfortable again and then slowly increase them. If you have problems or questions, call your caregiver or physical therapist for advice.   Rehabilitation is important following a joint replacement. After just a few days of immobilization, the muscles of the leg can become weakened and shrink (atrophy).  These exercises are designed to build up the tone and strength of the thigh and leg muscles and to  improve motion. Often times heat used for twenty to thirty minutes before working out will loosen up your tissues and help with improving the range of motion but do not use heat for the first two weeks following surgery (sometimes heat can increase post-operative swelling).   These exercises can be done on a training (exercise) mat, on the floor, on a table or on a bed. Use whatever works the best and is most comfortable for you.    Use music or television while you are exercising so that the exercises are a pleasant break in your day. This will make your life better with the exercises acting as a break in your routine that you can look forward to.   Perform all exercises about fifteen times, three times per day or as directed.  You should exercise both the operative leg and the other leg as well.   Exercises include:  Quad Sets -  Tighten up the muscle on the front of the thigh (Quad) and hold for 5-10 seconds.   Straight Leg Raises - With your knee straight (if you were given a brace, keep it on), lift the leg to 60 degrees, hold for 3 seconds, and slowly lower the leg.  Perform this exercise against resistance later as your leg gets stronger.  Leg Slides: Lying on your back, slowly slide your foot toward your buttocks, bending your knee up off the floor (only go as far as is comfortable). Then slowly slide your foot back down until your leg is flat on the floor again.  Angel Wings: Lying on your back spread your legs to the side as far apart as you can without causing discomfort.  Hamstring Strength:  Lying on your back, push your heel against the floor with your leg straight by tightening up the muscles of your buttocks.  Repeat, but this time bend your knee to a comfortable angle, and push your heel against the floor.  You may put a pillow under the heel to make it more comfortable if necessary.   A rehabilitation program following joint replacement surgery can speed recovery and prevent re-injury in  the future due to weakened muscles. Contact your doctor or a physical therapist for more information on knee rehabilitation.    CONSTIPATION  Constipation is defined medically as fewer than three stools per week and severe constipation as less than one stool per week.  Even if you have a regular bowel pattern at home, your normal regimen is likely to be disrupted due to multiple reasons following surgery.  Combination of anesthesia, postoperative narcotics, change in appetite and fluid intake all can affect your bowels.   YOU MUST use at least one of the following options; they are listed in order of increasing strength to get the job done.  They are all available over the counter, and you may need to use some, POSSIBLY even all of these options:    Drink plenty of fluids (prune juice may be helpful) and high fiber foods Colace 100 mg by mouth twice a day  Senokot for constipation as directed and as needed Dulcolax (bisacodyl), take with full glass of water  Miralax (polyethylene glycol) once or twice a day as needed.  If you have tried all these things and are unable to have a bowel movement in the first 3-4 days after surgery call either your surgeon or your primary doctor.    If you experience loose stools or diarrhea, hold the medications until you stool forms back up.  If your symptoms do not get better within 1 week or if they get worse, check with your doctor.  If you experience "the worst abdominal pain ever" or develop nausea or vomiting, please contact the office immediately for further recommendations for treatment.   ITCHING:  If you experience itching with your medications, try taking only a single pain pill, or even half a pain pill at a time.  You can also use Benadryl over the counter for itching or also to help with sleep.   TED HOSE STOCKINGS:  Use stockings on both legs until for at least 2 weeks or as directed by physician office. They may be removed at night for  sleeping.  MEDICATIONS:  See your medication summary on the "After Visit Summary" that nursing will review with you.  You may have some home medications which will be placed on hold until you complete the course of blood thinner  medication.  It is important for you to complete the blood thinner medication as prescribed.  PRECAUTIONS:  If you experience chest pain or shortness of breath - call 911 immediately for transfer to the hospital emergency department.   If you develop a fever greater that 101 F, purulent drainage from wound, increased redness or drainage from wound, foul odor from the wound/dressing, or calf pain - CONTACT YOUR SURGEON.                                                   FOLLOW-UP APPOINTMENTS:  If you do not already have a post-op appointment, please call the office for an appointment to be seen by your surgeon.  Guidelines for how soon to be seen are listed in your "After Visit Summary", but are typically between 1-4 weeks after surgery.  OTHER INSTRUCTIONS:   Knee Replacement:  Do not place pillow under knee, focus on keeping the knee straight while resting. CPM instructions: 0-90 degrees, 2 hours in the morning, 2 hours in the afternoon, and 2 hours in the evening. Place foam block, curve side up under heel at all times except when in CPM or when walking.  DO NOT modify, tear, cut, or change the foam block in any way.  MAKE SURE YOU:  Understand these instructions.  Get help right away if you are not doing well or get worse.    Thank you for letting us be a part of your medical care team.  It is a privilege we respect greatly.  We hope these instructions will help you stay on track for a fast and full recovery!   Do not put a pillow under the knee. Place it under the heel.   Complete by:  As directed    Place gray foam block, curve side up under heel at all times except when in CPM or when walking.  DO NOT modify, tear, cut, or change in any way the gray foam block.    Increase activity slowly as tolerated   Complete by:  As directed    Patient may shower   Complete by:  As directed    Aquacel dressing is water proof    Wash over it and the whole leg with soap and water at the end of your shower   TED hose   Complete by:  As directed    Use stockings (TED hose) for 2 weeks on both leg(s).  You may remove them at night for sleeping.      Follow-up Information    [name], MD. Go on 01/31/2019.   Specialty:  Orthopedic Surgery Why:  Your appointment has been scheduled for 2:00p  Contact information: [address] [place] [address] [phone]        [facility] Follow up.   Specialty:  [place] Why:  You will have 5 HHPT visits prior to returning to see Dr. Leotis Pain information: [place] [id number] [phone]        [facility], [place]. Go on 01/31/2019.   Why:  You are scheduled to start outpatient physical therapy at 3:00. Please go directly across the hall to complete your paperwork after seeing Dr. Leotis Pain information: [facility] [place] [address]  [phone]            Signed: Linda Hedges 01/18/2019, 3:42 PM

## 2019-01-18 NOTE — TOC Transition Note (Signed)
Transition of Care [facility]) - CM/SW Discharge Note   Patient Details  Name: Katherine Oliver MRN: [id number] Date of Birth: Aug 25, 1946  Transition of Care [facility]) CM/SW Contact:  Leeroy Cha, RN Phone Number: 01/18/2019, 10:26 AM   Clinical Narrative:    DME Arranged:  Gilford Rile rolling, CPM, Bedside commode DME Agency:  [employer]  HH Arranged:  PT [place] Agency:  [facility]   Final next level of care: [place] Barriers to Discharge: No Barriers Identified   Patient Goals and CMS Choice Patient states their goals for this hospitalization and ongoing recovery are:: just to go home CMS Medicare.gov Compare Post Acute Care list provided to:: Patient Choice offered to / list presented to : Patient  Discharge Placement  home with home health care                     Discharge Plan and Services Discharge Planning Services: CM Consult Post Acute Care Choice: Home Health, Durable Medical Equipment          DME Arranged: Walker rolling, 3-N-1 DME Agency: [facility] HH Arranged: PT [place] [facility]: [facility] [employer]   Social Determinants of Health (SDOH) Interventions     Readmission Risk Interventions No flowsheet data found.

## 2019-01-18 NOTE — Evaluation (Signed)
Occupational Therapy Evaluation Patient Details Name: Katherine Oliver MRN: [id number] DOB: Mar 21, 1946 Today's Date: 01/18/2019    History of Present Illness RTKA   Clinical Impression   OT eval and education complete.  Pt has needed DME.  Husband will A needed             Precautions / Restrictions Precautions Precautions: Knee;Fall      Mobility Bed Mobility   Bed Mobility: Sit to Supine       Sit to supine: Min assist   General bed mobility comments: pt OOB  Transfers Overall transfer level: Needs assistance Equipment used: Rolling walker (2 wheeled) Transfers: Sit to/from [facility] Sit to Stand: Min guard Stand pivot transfers: Min guard            Balance Overall balance assessment: No apparent balance deficits (not formally assessed)                                         ADL either performed or assessed with clinical judgement   ADL Overall ADL's : Needs assistance/impaired Eating/Feeding: Set up;Sitting   Grooming: Set up;Sitting       Lower Body Bathing: Minimal assistance;Sit to/from stand;Cueing for sequencing;Cueing for safety   Upper Body Dressing : Set up;Sitting   Lower Body Dressing: Minimal assistance;Sit to/from stand;Cueing for sequencing;Cueing for safety   Toilet Transfer: Minimal assistance;RW;Ambulation;Comfort height toilet   Toileting- Clothing Manipulation and Hygiene: Minimal assistance;Sit to/from stand;Cueing for sequencing;Cueing for safety     Tub/Shower Transfer Details (indicate cue type and reason): verbalized safety with tub bench. pt has a tub bench Functional mobility during ADLs: Min guard;Cueing for sequencing;Rolling walker       Vision Patient Visual Report: No change from baseline              Pertinent Vitals/Pain Pain Score: 3  Pain Location: right knee Pain Descriptors / Indicators: Aching;Discomfort Pain Intervention(s): Limited activity within  patient's tolerance;Repositioned     Hand Dominance     Extremity/Trunk Assessment Upper Extremity Assessment Upper Extremity Assessment: Overall WFL for tasks assessed           Communication Communication Communication: No difficulties   Cognition Arousal/Alertness: Awake/alert Behavior During Therapy: WFL for tasks assessed/performed Overall Cognitive Status: Within Functional Limits for tasks assessed                                     General Comments               Home Living Family/patient expects to be discharged to:: Private residence Living Arrangements: Spouse/significant other Available Help at Discharge: Family Type of Home: House Home Access: Stairs to enter [employer] of Steps: 1 x 2 Entrance Stairs-Rails: None Home Layout: Two level;Able to live on main level with bedroom/bathroom     Bathroom Shower/Tub: Walk-in shower         Home Equipment: Gilford Rile - 2 wheels          Prior Functioning/Environment Level of Independence: Independent                          OT Goals(Current goals can be found in the care plan section) Acute Rehab OT Goals Patient Stated Goal: to go home OT Goal Formulation:  With patient Time For Goal Achievement: 01/25/19 Potential to Achieve Goals: Good  OT Frequency:      AM-PAC OT "6 Clicks" Daily Activity     Outcome Measure Help from another person eating meals?: None Help from another person taking care of personal grooming?: None Help from another person toileting, which includes using toliet, bedpan, or urinal?: A Little Help from another person bathing (including washing, rinsing, drying)?: A Little Help from another person to put on and taking off regular upper body clothing?: None Help from another person to put on and taking off regular lower body clothing?: A Little 6 Click Score: 21   End of Session Equipment Utilized During Treatment: Gait belt;Rolling  walker  Activity Tolerance: Patient tolerated treatment well Patient left: in chair;with call bell/phone within reach                   Time: 1125-1138 OT Time Calculation (min): 13 min Charges:  OT General Charges $OT Visit: 1 Visit OT Evaluation $OT Eval Low Complexity: 1 Low  Kari Baars, OT Acute Rehabilitation Services [phone] Office- [phone]     [place], Edwena Felty D 01/18/2019, 1:33 PM

## 2019-01-18 NOTE — Care Management CC44 (Signed)
Condition Code 44 Documentation Completed  Patient Details  Name: Katherine Oliver MRN: [id number] Date of Birth: 07/09/1946   Condition Code 44 given:  Yes Patient signature on Condition Code 44 notice:  Yes Documentation of 2 MD's agreement:  Yes Code 44 added to claim:  Yes    Leeroy Cha, RN 01/18/2019, 3:50 PM

## 2019-01-18 NOTE — Care Management Obs Status (Signed)
[place] NOTIFICATION   Patient Details  Name: Katherine Oliver MRN: [id number] Date of Birth: Jun 16, 1946   Medicare Observation Status Notification Given:  Yes    Leeroy Cha, RN 01/18/2019, 3:50 PM

## 2019-01-18 NOTE — Progress Notes (Signed)
Physical Therapy Treatment Patient Details Name: Katherine Oliver MRN: [id number] DOB: 23-Dec-1945 Today's Date: 01/18/2019    History of Present Illness RTKA    PT Comments    Patient feels better. Will Dc to day. Practiced 1 steps and reviewed exercises with family.   Follow Up Recommendations  Home health PT     Equipment Recommendations  None recommended by PT    Recommendations for Other Services       Precautions / Restrictions Precautions Precautions: Knee;Fall    Mobility  Bed Mobility   Bed Mobility: Supine to Sit     Supine to sit: Min assist Sit to supine: Min assist   General bed mobility comments: min assist with 1 hand, instructed in use of RW beside the bed if needed for support  Transfers Overall transfer level: Needs assistance Equipment used: Rolling walker (2 wheeled) Transfers: Sit to/from Stand Sit to Stand: Min guard Stand pivot transfers: Min guard       General transfer comment: cues for sequence and posture  Ambulation/Gait Ambulation/Gait assistance: Min guard Gait Distance (Feet): 60 Feet Assistive device: Rolling walker (2 wheeled) Gait Pattern/deviations: Step-to pattern;Step-through pattern     General Gait Details: cues for sequence   Stairs Stairs: Yes Stairs assistance: Min assist Stair Management: No rails;Step to pattern;Forwards;With walker Number of Stairs: 1 General stair comments: spouse and son present, practiced x 2    Wheelchair Mobility    Modified Rankin (Stroke Patients Only)       Balance Overall balance assessment: No apparent balance deficits (not formally assessed)                                          Cognition Arousal/Alertness: Awake/alert Behavior During Therapy: WFL for tasks assessed/performed Overall Cognitive Status: Within Functional Limits for tasks assessed                                      Verbally reviewed exercises.   Exercises Total Joint Exercises Ankle Circles/Pumps: AROM;10 reps;Left Quad Sets: AROM;10 reps;Left Towel Squeeze: AROM;Both;10 reps Heel Slides: AAROM;Right;10 reps Hip ABduction/ADduction: AAROM;Right;10 reps Straight Leg Raises: AAROM;Right;10 reps Long Arc Quad: AAROM;Right;10 reps Goniometric ROM: 5-50 knee flex right    General Comments        Pertinent Vitals/Pain Pain Score: 5  Pain Location: right knee Pain Descriptors / Indicators: Discomfort;Tender Pain Intervention(s): Repositioned;Premedicated before session;Monitored during session    [place] expects to be discharged to:: Private residence Living Arrangements: Spouse/significant other Available Help at Discharge: Family Type of Home: House Home Access: Stairs to enter Entrance Stairs-Rails: None Home Layout: Two level;Able to live on main level with bedroom/bathroom Home Equipment: Walker - 2 wheels      Prior Function Level of Independence: Independent          PT Goals (current goals can now be found in the care plan section) Acute Rehab PT Goals Patient Stated Goal: to go home Progress towards PT goals: Progressing toward goals    Frequency    7X/week      PT Plan Current plan remains appropriate    Co-evaluation              AM-PAC PT "6 Clicks" Mobility   Outcome Measure  Help needed turning from your back to  your side while in a flat bed without using bedrails?: A Little Help needed moving from lying on your back to sitting on the side of a flat bed without using bedrails?: A Little Help needed moving to and from a bed to a chair (including a wheelchair)?: A Little Help needed standing up from a chair using your arms (e.g., wheelchair or bedside chair)?: A Little Help needed to walk in hospital room?: A Little Help needed climbing 3-5 steps with a railing? : A Lot 6 Click Score: 17    End of Session   Activity Tolerance: Patient tolerated treatment  well Patient left: in chair;with call bell/phone within reach;with family/visitor present Nurse Communication: Mobility status PT Visit Diagnosis: Unsteadiness on feet (R26.81)     Time: 1510-1531 PT Time Calculation (min) (ACUTE ONLY): 21 min  Charges:  $Gait Training: 8-22 mins       [place] Pager [phone] Office [phone]    Claretha Cooper 01/18/2019, 3:37 PM

## 2019-01-18 NOTE — Progress Notes (Signed)
Physical Therapy Treatment Patient Details Name: Katherine Oliver MRN: [id number] DOB: 06-04-1946 Today's Date: 01/18/2019    History of Present Illness RTKA    PT Comments    The patient is feeling better. Plans Dc tomorrow most likely. Has had issues with nausea. Continue PT.  Follow Up Recommendations  Home health PT     Equipment Recommendations  None recommended by PT    Recommendations for Other Services       Precautions / Restrictions Precautions Precautions: Knee;Fall    Mobility  Bed Mobility   Bed Mobility: Sit to Supine       Sit to supine: Min assist   General bed mobility comments: assist the right leg  Transfers Overall transfer level: Needs assistance Equipment used: Rolling walker (2 wheeled) Transfers: Sit to/from Stand Sit to Stand: Min assist            Ambulation/Gait Ambulation/Gait assistance: Min Web designer (Feet): 60 Feet Assistive device: Rolling walker (2 wheeled) Gait Pattern/deviations: Step-to pattern;Step-through pattern     General Gait Details: cues for sequence   Stairs             Wheelchair Mobility    Modified Rankin (Stroke Patients Only)       Balance                                            Cognition Arousal/Alertness: Awake/alert Behavior During Therapy: WFL for tasks assessed/performed Overall Cognitive Status: Within Functional Limits for tasks assessed                                        Exercises Total Joint Exercises Ankle Circles/Pumps: AROM;10 reps;Left Quad Sets: AROM;10 reps;Left Towel Squeeze: AROM;Both;10 reps Heel Slides: AAROM;Right;10 reps Hip ABduction/ADduction: AAROM;Right;10 reps Straight Leg Raises: AAROM;Right;10 reps Long Arc Quad: AAROM;Right;10 reps Goniometric ROM: 5-50 knee flex right    General Comments        Pertinent Vitals/Pain Pain Score: 3  Pain Location: right knee Pain Descriptors /  Indicators: Aching;Discomfort Pain Intervention(s): Limited activity within patient's tolerance;Repositioned    Home Living Family/patient expects to be discharged to:: Private residence Living Arrangements: Spouse/significant other Available Help at Discharge: Family Type of Home: House Home Access: Stairs to enter Entrance Stairs-Rails: None Home Layout: Two level;Able to live on main level with bedroom/bathroom Home Equipment: Walker - 2 wheels      Prior Function Level of Independence: Independent          PT Goals (current goals can now be found in the care plan section) Progress towards PT goals: Progressing toward goals    Frequency    7X/week      PT Plan Current plan remains appropriate    Co-evaluation              AM-PAC PT "6 Clicks" Mobility   Outcome Measure  Help needed turning from your back to your side while in a flat bed without using bedrails?: A Little Help needed moving from lying on your back to sitting on the side of a flat bed without using bedrails?: A Little Help needed moving to and from a bed to a chair (including a wheelchair)?: A Little Help needed standing up from a chair using your arms (e.g., wheelchair  or bedside chair)?: A Little Help needed to walk in hospital room?: A Little Help needed climbing 3-5 steps with a railing? : Total 6 Click Score: 16    End of Session   Activity Tolerance: Patient tolerated treatment well Patient left: in bed;with call bell/phone within reach Nurse Communication: Mobility status PT Visit Diagnosis: Unsteadiness on feet (R26.81)     Time: 2563-8937 PT Time Calculation (min) (ACUTE ONLY): 31 min  Charges:  $Gait Training: 8-22 mins $Therapeutic Exercise: 8-22 mins                     Tresa Endo PT Acute Rehabilitation Services Pager [phone] Office [phone]    Claretha Cooper 01/18/2019, 1:29 PM

## 2019-01-20 DIAGNOSIS — Z6830 Body mass index (BMI) 30.0-30.9, adult: Secondary | ICD-10-CM | POA: Diagnosis not present

## 2019-01-20 DIAGNOSIS — E785 Hyperlipidemia, unspecified: Secondary | ICD-10-CM | POA: Diagnosis not present

## 2019-01-20 DIAGNOSIS — K760 Fatty (change of) liver, not elsewhere classified: Secondary | ICD-10-CM | POA: Diagnosis not present

## 2019-01-20 DIAGNOSIS — M8580 Other specified disorders of bone density and structure, unspecified site: Secondary | ICD-10-CM | POA: Diagnosis not present

## 2019-01-20 DIAGNOSIS — I10 Essential (primary) hypertension: Secondary | ICD-10-CM | POA: Diagnosis not present

## 2019-01-20 DIAGNOSIS — Z96651 Presence of right artificial knee joint: Secondary | ICD-10-CM | POA: Diagnosis not present

## 2019-01-20 DIAGNOSIS — D751 Secondary polycythemia: Secondary | ICD-10-CM | POA: Diagnosis not present

## 2019-01-20 DIAGNOSIS — K219 Gastro-esophageal reflux disease without esophagitis: Secondary | ICD-10-CM | POA: Diagnosis not present

## 2019-01-20 DIAGNOSIS — H35033 Hypertensive retinopathy, bilateral: Secondary | ICD-10-CM | POA: Diagnosis not present

## 2019-01-20 DIAGNOSIS — R918 Other nonspecific abnormal finding of lung field: Secondary | ICD-10-CM | POA: Diagnosis not present

## 2019-01-20 DIAGNOSIS — E1122 Type 2 diabetes mellitus with diabetic chronic kidney disease: Secondary | ICD-10-CM | POA: Diagnosis not present

## 2019-01-20 DIAGNOSIS — Z7982 Long term (current) use of aspirin: Secondary | ICD-10-CM | POA: Diagnosis not present

## 2019-01-20 DIAGNOSIS — N183 Chronic kidney disease, stage 3 (moderate): Secondary | ICD-10-CM | POA: Diagnosis not present

## 2019-01-20 DIAGNOSIS — L719 Rosacea, unspecified: Secondary | ICD-10-CM | POA: Diagnosis not present

## 2019-01-20 DIAGNOSIS — M65341 Trigger finger, right ring finger: Secondary | ICD-10-CM | POA: Diagnosis not present

## 2019-01-20 DIAGNOSIS — Z471 Aftercare following joint replacement surgery: Secondary | ICD-10-CM | POA: Diagnosis not present

## 2019-01-20 DIAGNOSIS — N390 Urinary tract infection, site not specified: Secondary | ICD-10-CM | POA: Diagnosis not present

## 2019-01-20 DIAGNOSIS — J309 Allergic rhinitis, unspecified: Secondary | ICD-10-CM | POA: Diagnosis not present

## 2019-01-20 DIAGNOSIS — I444 Left anterior fascicular block: Secondary | ICD-10-CM | POA: Diagnosis not present

## 2019-01-20 DIAGNOSIS — L309 Dermatitis, unspecified: Secondary | ICD-10-CM | POA: Diagnosis not present

## 2019-01-20 DIAGNOSIS — N6489 Other specified disorders of breast: Secondary | ICD-10-CM | POA: Diagnosis not present

## 2019-01-20 DIAGNOSIS — Z7984 Long term (current) use of oral hypoglycemic drugs: Secondary | ICD-10-CM | POA: Diagnosis not present

## 2019-01-20 DIAGNOSIS — M2570 Osteophyte, unspecified joint: Secondary | ICD-10-CM | POA: Diagnosis not present

## 2019-01-20 DIAGNOSIS — E669 Obesity, unspecified: Secondary | ICD-10-CM | POA: Diagnosis not present

## 2019-01-20 DIAGNOSIS — M7581 Other shoulder lesions, right shoulder: Secondary | ICD-10-CM | POA: Diagnosis not present

## 2019-01-21 DIAGNOSIS — E1122 Type 2 diabetes mellitus with diabetic chronic kidney disease: Secondary | ICD-10-CM | POA: Diagnosis not present

## 2019-01-21 DIAGNOSIS — N390 Urinary tract infection, site not specified: Secondary | ICD-10-CM | POA: Diagnosis not present

## 2019-01-21 DIAGNOSIS — Z471 Aftercare following joint replacement surgery: Secondary | ICD-10-CM | POA: Diagnosis not present

## 2019-01-21 DIAGNOSIS — N183 Chronic kidney disease, stage 3 (moderate): Secondary | ICD-10-CM | POA: Diagnosis not present

## 2019-01-21 DIAGNOSIS — I444 Left anterior fascicular block: Secondary | ICD-10-CM | POA: Diagnosis not present

## 2019-01-21 DIAGNOSIS — I10 Essential (primary) hypertension: Secondary | ICD-10-CM | POA: Diagnosis not present

## 2019-01-22 ENCOUNTER — Other Ambulatory Visit: Payer: Self-pay | Admitting: Family Medicine

## 2019-01-24 ENCOUNTER — Encounter (HOSPITAL_COMMUNITY): Payer: Self-pay | Admitting: Orthopedic Surgery

## 2019-01-24 DIAGNOSIS — Z471 Aftercare following joint replacement surgery: Secondary | ICD-10-CM | POA: Diagnosis not present

## 2019-01-24 DIAGNOSIS — N390 Urinary tract infection, site not specified: Secondary | ICD-10-CM | POA: Diagnosis not present

## 2019-01-24 DIAGNOSIS — I10 Essential (primary) hypertension: Secondary | ICD-10-CM | POA: Diagnosis not present

## 2019-01-24 DIAGNOSIS — I444 Left anterior fascicular block: Secondary | ICD-10-CM | POA: Diagnosis not present

## 2019-01-24 DIAGNOSIS — E1122 Type 2 diabetes mellitus with diabetic chronic kidney disease: Secondary | ICD-10-CM | POA: Diagnosis not present

## 2019-01-24 DIAGNOSIS — N183 Chronic kidney disease, stage 3 (moderate): Secondary | ICD-10-CM | POA: Diagnosis not present

## 2019-01-25 DIAGNOSIS — N390 Urinary tract infection, site not specified: Secondary | ICD-10-CM | POA: Diagnosis not present

## 2019-01-25 DIAGNOSIS — E1122 Type 2 diabetes mellitus with diabetic chronic kidney disease: Secondary | ICD-10-CM | POA: Diagnosis not present

## 2019-01-25 DIAGNOSIS — I10 Essential (primary) hypertension: Secondary | ICD-10-CM | POA: Diagnosis not present

## 2019-01-25 DIAGNOSIS — I444 Left anterior fascicular block: Secondary | ICD-10-CM | POA: Diagnosis not present

## 2019-01-25 DIAGNOSIS — Z471 Aftercare following joint replacement surgery: Secondary | ICD-10-CM | POA: Diagnosis not present

## 2019-01-25 DIAGNOSIS — N183 Chronic kidney disease, stage 3 (moderate): Secondary | ICD-10-CM | POA: Diagnosis not present

## 2019-01-27 ENCOUNTER — Other Ambulatory Visit: Payer: Medicare Other

## 2019-01-27 DIAGNOSIS — M1711 Unilateral primary osteoarthritis, right knee: Secondary | ICD-10-CM | POA: Diagnosis not present

## 2019-01-27 DIAGNOSIS — Z471 Aftercare following joint replacement surgery: Secondary | ICD-10-CM | POA: Diagnosis not present

## 2019-01-27 DIAGNOSIS — I444 Left anterior fascicular block: Secondary | ICD-10-CM | POA: Diagnosis not present

## 2019-01-27 DIAGNOSIS — N390 Urinary tract infection, site not specified: Secondary | ICD-10-CM | POA: Diagnosis not present

## 2019-01-27 DIAGNOSIS — N183 Chronic kidney disease, stage 3 (moderate): Secondary | ICD-10-CM | POA: Diagnosis not present

## 2019-01-27 DIAGNOSIS — E1122 Type 2 diabetes mellitus with diabetic chronic kidney disease: Secondary | ICD-10-CM | POA: Diagnosis not present

## 2019-01-27 DIAGNOSIS — I10 Essential (primary) hypertension: Secondary | ICD-10-CM | POA: Diagnosis not present

## 2019-01-31 DIAGNOSIS — R262 Difficulty in walking, not elsewhere classified: Secondary | ICD-10-CM | POA: Diagnosis not present

## 2019-01-31 DIAGNOSIS — M6281 Muscle weakness (generalized): Secondary | ICD-10-CM | POA: Diagnosis not present

## 2019-01-31 DIAGNOSIS — M25561 Pain in right knee: Secondary | ICD-10-CM | POA: Diagnosis not present

## 2019-01-31 DIAGNOSIS — M25661 Stiffness of right knee, not elsewhere classified: Secondary | ICD-10-CM | POA: Diagnosis not present

## 2019-02-02 DIAGNOSIS — M25661 Stiffness of right knee, not elsewhere classified: Secondary | ICD-10-CM | POA: Diagnosis not present

## 2019-02-02 DIAGNOSIS — M6281 Muscle weakness (generalized): Secondary | ICD-10-CM | POA: Diagnosis not present

## 2019-02-02 DIAGNOSIS — R262 Difficulty in walking, not elsewhere classified: Secondary | ICD-10-CM | POA: Diagnosis not present

## 2019-02-02 DIAGNOSIS — M25561 Pain in right knee: Secondary | ICD-10-CM | POA: Diagnosis not present

## 2019-02-04 DIAGNOSIS — M6281 Muscle weakness (generalized): Secondary | ICD-10-CM | POA: Diagnosis not present

## 2019-02-04 DIAGNOSIS — M25661 Stiffness of right knee, not elsewhere classified: Secondary | ICD-10-CM | POA: Diagnosis not present

## 2019-02-04 DIAGNOSIS — R262 Difficulty in walking, not elsewhere classified: Secondary | ICD-10-CM | POA: Diagnosis not present

## 2019-02-04 DIAGNOSIS — M25561 Pain in right knee: Secondary | ICD-10-CM | POA: Diagnosis not present

## 2019-02-07 DIAGNOSIS — M25661 Stiffness of right knee, not elsewhere classified: Secondary | ICD-10-CM | POA: Diagnosis not present

## 2019-02-07 DIAGNOSIS — M25561 Pain in right knee: Secondary | ICD-10-CM | POA: Diagnosis not present

## 2019-02-07 DIAGNOSIS — R262 Difficulty in walking, not elsewhere classified: Secondary | ICD-10-CM | POA: Diagnosis not present

## 2019-02-07 DIAGNOSIS — M6281 Muscle weakness (generalized): Secondary | ICD-10-CM | POA: Diagnosis not present

## 2019-02-09 DIAGNOSIS — M25661 Stiffness of right knee, not elsewhere classified: Secondary | ICD-10-CM | POA: Diagnosis not present

## 2019-02-09 DIAGNOSIS — R262 Difficulty in walking, not elsewhere classified: Secondary | ICD-10-CM | POA: Diagnosis not present

## 2019-02-09 DIAGNOSIS — M25561 Pain in right knee: Secondary | ICD-10-CM | POA: Diagnosis not present

## 2019-02-09 DIAGNOSIS — M6281 Muscle weakness (generalized): Secondary | ICD-10-CM | POA: Diagnosis not present

## 2019-02-10 DIAGNOSIS — M25561 Pain in right knee: Secondary | ICD-10-CM | POA: Diagnosis not present

## 2019-02-14 DIAGNOSIS — M6281 Muscle weakness (generalized): Secondary | ICD-10-CM | POA: Diagnosis not present

## 2019-02-14 DIAGNOSIS — M25561 Pain in right knee: Secondary | ICD-10-CM | POA: Diagnosis not present

## 2019-02-14 DIAGNOSIS — R262 Difficulty in walking, not elsewhere classified: Secondary | ICD-10-CM | POA: Diagnosis not present

## 2019-02-14 DIAGNOSIS — M25661 Stiffness of right knee, not elsewhere classified: Secondary | ICD-10-CM | POA: Diagnosis not present

## 2019-02-16 DIAGNOSIS — M25561 Pain in right knee: Secondary | ICD-10-CM | POA: Diagnosis not present

## 2019-02-16 DIAGNOSIS — R262 Difficulty in walking, not elsewhere classified: Secondary | ICD-10-CM | POA: Diagnosis not present

## 2019-02-16 DIAGNOSIS — M25661 Stiffness of right knee, not elsewhere classified: Secondary | ICD-10-CM | POA: Diagnosis not present

## 2019-02-16 DIAGNOSIS — M6281 Muscle weakness (generalized): Secondary | ICD-10-CM | POA: Diagnosis not present

## 2019-02-17 ENCOUNTER — Telehealth: Payer: Self-pay | Admitting: Family Medicine

## 2019-02-17 DIAGNOSIS — M25661 Stiffness of right knee, not elsewhere classified: Secondary | ICD-10-CM | POA: Diagnosis not present

## 2019-02-17 DIAGNOSIS — R262 Difficulty in walking, not elsewhere classified: Secondary | ICD-10-CM | POA: Diagnosis not present

## 2019-02-17 DIAGNOSIS — M6281 Muscle weakness (generalized): Secondary | ICD-10-CM | POA: Diagnosis not present

## 2019-02-17 DIAGNOSIS — M25561 Pain in right knee: Secondary | ICD-10-CM | POA: Diagnosis not present

## 2019-02-17 NOTE — Telephone Encounter (Signed)
Will you put in lab orders for pt she is having 6 month follow up on Monday for diabetes?

## 2019-02-17 NOTE — Telephone Encounter (Signed)
Do you just want POC A1c ordered?

## 2019-02-18 ENCOUNTER — Other Ambulatory Visit (INDEPENDENT_AMBULATORY_CARE_PROVIDER_SITE_OTHER): Payer: Medicare Other

## 2019-02-18 ENCOUNTER — Other Ambulatory Visit: Payer: Self-pay | Admitting: Family Medicine

## 2019-02-18 DIAGNOSIS — E782 Mixed hyperlipidemia: Secondary | ICD-10-CM

## 2019-02-18 LAB — LIPID PANEL
Cholesterol: 233 mg/dL — ABNORMAL HIGH (ref 0–200)
HDL: 37.9 mg/dL — ABNORMAL LOW (ref >39.00)
NonHDL: 195.06
Total CHOL/HDL Ratio: 6
Triglycerides: 298 mg/dL — ABNORMAL HIGH (ref 0.0–149.0)
VLDL: 59.6 mg/dL — ABNORMAL HIGH (ref 0.0–40.0)

## 2019-02-18 LAB — BASIC METABOLIC PANEL
BUN: 29 mg/dL — ABNORMAL HIGH (ref 6–23)
CO2: 25 mEq/L (ref 19–32)
Calcium: 10.2 mg/dL (ref 8.4–10.5)
Chloride: 104 mEq/L (ref 96–112)
Creatinine, Ser: 1.03 mg/dL (ref 0.40–1.20)
GFR: 52.31 mL/min — ABNORMAL LOW (ref >60.00)
Glucose, Bld: 98 mg/dL (ref 70–99)
Potassium: 4.3 mEq/L (ref 3.5–5.1)
Sodium: 140 mEq/L (ref 135–145)

## 2019-02-18 LAB — LDL CHOLESTEROL, DIRECT: Direct LDL: 141 mg/dL

## 2019-02-18 NOTE — Telephone Encounter (Signed)
Labs ordered. Too soon for A1c.

## 2019-02-21 ENCOUNTER — Ambulatory Visit: Payer: Medicare Other | Admitting: Family Medicine

## 2019-02-21 ENCOUNTER — Ambulatory Visit (INDEPENDENT_AMBULATORY_CARE_PROVIDER_SITE_OTHER): Payer: Medicare Other | Admitting: Family Medicine

## 2019-02-21 ENCOUNTER — Encounter: Payer: Self-pay | Admitting: Family Medicine

## 2019-02-21 VITALS — BP 157/90 | HR 91 | Temp 98.0°F | Ht 61.0 in | Wt 172.4 lb

## 2019-02-21 DIAGNOSIS — E1121 Type 2 diabetes mellitus with diabetic nephropathy: Secondary | ICD-10-CM

## 2019-02-21 DIAGNOSIS — E669 Obesity, unspecified: Secondary | ICD-10-CM

## 2019-02-21 DIAGNOSIS — M1711 Unilateral primary osteoarthritis, right knee: Secondary | ICD-10-CM | POA: Diagnosis not present

## 2019-02-21 DIAGNOSIS — N183 Chronic kidney disease, stage 3 unspecified: Secondary | ICD-10-CM

## 2019-02-21 DIAGNOSIS — E1122 Type 2 diabetes mellitus with diabetic chronic kidney disease: Secondary | ICD-10-CM | POA: Diagnosis not present

## 2019-02-21 DIAGNOSIS — M25561 Pain in right knee: Secondary | ICD-10-CM

## 2019-02-21 DIAGNOSIS — E782 Mixed hyperlipidemia: Secondary | ICD-10-CM

## 2019-02-21 DIAGNOSIS — I444 Left anterior fascicular block: Secondary | ICD-10-CM | POA: Diagnosis not present

## 2019-02-21 DIAGNOSIS — R110 Nausea: Secondary | ICD-10-CM | POA: Insufficient documentation

## 2019-02-21 DIAGNOSIS — R262 Difficulty in walking, not elsewhere classified: Secondary | ICD-10-CM | POA: Diagnosis not present

## 2019-02-21 DIAGNOSIS — M25661 Stiffness of right knee, not elsewhere classified: Secondary | ICD-10-CM | POA: Diagnosis not present

## 2019-02-21 DIAGNOSIS — I10 Essential (primary) hypertension: Secondary | ICD-10-CM

## 2019-02-21 DIAGNOSIS — M6281 Muscle weakness (generalized): Secondary | ICD-10-CM | POA: Diagnosis not present

## 2019-02-21 MED ORDER — BENAZEPRIL HCL 20 MG PO TABS: 20.0000 mg | ORAL_TABLET | Freq: Every day | ORAL | 1 refills | Status: DC

## 2019-02-21 MED ORDER — ACCU-CHEK SOFT TOUCH LANCETS MISC: 3 refills | Status: DC

## 2019-02-21 MED ORDER — HYDROCHLOROTHIAZIDE 12.5 MG PO CAPS: 12.5000 mg | ORAL_CAPSULE | Freq: Every day | ORAL | 1 refills | Status: DC

## 2019-02-21 MED ORDER — ONDANSETRON HCL 4 MG PO TABS: 4.0000 mg | ORAL_TABLET | Freq: Three times a day (TID) | ORAL | 0 refills | Status: DC | PRN

## 2019-02-21 NOTE — Assessment & Plan Note (Signed)
Chronic stable. Continue current regimen. 

## 2019-02-21 NOTE — Assessment & Plan Note (Signed)
After surgery, persist possibly due to recent keflex course. rec zofran PRN nausea. She desires to avoid reglan due to side effects. Recommended she continue probiotic recently started Designer, television/film set).

## 2019-02-21 NOTE — Assessment & Plan Note (Signed)
Chronic, stable. Reviewed importance of hydration status and avoiding nephrotoxic agents.

## 2019-02-21 NOTE — Assessment & Plan Note (Signed)
Chronic. Elevated today however normally running well controlled. No changes made. I did ask her to continue to monitor BP routinely and touch base with ortho if persistently >140/90 to start amlodipine 5mg .

## 2019-02-21 NOTE — Assessment & Plan Note (Signed)
Chronic. Deteriorated readings as she has been off fibrate and statin for the past 2 months. Advised restart crestor then consider restarting fibrate.  The 10-year ASCVD risk score Mikey Bussing DC Brooke Bonito., et al., 2013) is: 47.1%   Values used to calculate the score:     Age: 73 years     Sex: Female     Is Non-Hispanic African American: No     Diabetic: Yes     Tobacco smoker: No     Systolic Blood Pressure: 480 mmHg     Is BP treated: Yes     HDL Cholesterol: 37.9 mg/dL     Total Cholesterol: 233 mg/dL

## 2019-02-21 NOTE — Assessment & Plan Note (Signed)
Congratulated on wegiht loss to date.

## 2019-02-21 NOTE — Assessment & Plan Note (Signed)
New on latest EKG. Pending echo 4/30.

## 2019-02-21 NOTE — Assessment & Plan Note (Signed)
S/p R TKR 01/2019. Recovering well from ortho standpoint

## 2019-02-21 NOTE — Progress Notes (Signed)
Virtual visit completed through [employer]. Due to national recommendations of social distancing due to [place] 19, a virtual visit is felt to be most appropriate for this patient at this time.   Patient location: home, husband [name] also present on the video call Provider location: [facility], office If any vitals were documented, they were collected by patient at home unless specified below.    BP (!) 157/90 (BP Location: Left Arm, Patient Position: Sitting, Cuff Size: Normal)   Pulse 91   Temp 98 F (36.7 C)   Ht 5\' 1"  (1.549 m)   Wt 172 lb 6 oz (78.2 kg)   BMI 32.57 kg/m    CC: 6 mo f/u visit Subjective:    Patient ID: Katherine Oliver, female    DOB: July 20, 1946, 73 y.o.   MRN: [id number]  HPI: Katherine Oliver is a 73 y.o. female presenting on 02/21/2019 for Follow-up (6 mo f/u.)   Recent R knee replacement last month 01/2019 by Dr Noemi Chapel. Did well with this. Discharged with keflex course. She was treated for both UTI and cellulitis. She did have nausea/diarrhea after keflex course. She was prescribed metoclopramide PRN nausea. Doesn't want to take this. Would like zofran trial. Just started on florastor.   HTN - amlodipine was stopped on discharge to avoid pedal edema. she has continued benazepril 20mg  and hctz 12.5mg  daily however. Today BP was elevated, other days at home it has been well controlled (120/75) - she checks regularly. Denies HA, vision changes, CP/tightness, SOB, leg swelling (besides R knee).   DM - compliant with metformin XR 500mg  daily. Tolerating well. Fasting cbg's 90-110s.  Lab Results  Component Value Date   HGBA1C 6.6 (H) 01/10/2019    Dyslipidemia - has been off chol meds for last 2 months - previously on fenofibrate 160mg  and crestor 10mg  daily.   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   Happy because she's lost about 10 lbs.      Relevant past medical, surgical, family and social history reviewed  and updated as indicated. Interim medical history since our last visit reviewed. Allergies and medications reviewed and updated. Outpatient Medications Prior to Visit  Medication Sig Dispense Refill  . ACCU-CHEK AVIVA PLUS test strip USE AS DIRECTED TO CHECK BLOOD SUGARS ONCE DAILY E11.21 100 each 7  . acetaminophen (TYLENOL) 500 MG tablet Take 1,000 mg by mouth every 6 (six) hours as needed for moderate pain.     Marland Kitchen aspirin 81 MG tablet Take 81 mg by mouth daily.    . diphenhydrAMINE (BENADRYL) 25 MG tablet Take 25 mg by mouth 2 (two) times daily as needed for allergies.     Marland Kitchen docusate sodium (COLACE) 100 MG capsule 1 tab 2 times a day while on narcotics.  STOOL SOFTENER 10 capsule 0  . fenofibrate 160 MG tablet Take 1 tablet (160 mg total) by mouth daily. 90 tablet 3  . gabapentin (NEURONTIN) 300 MG capsule 1 tablet before bed for nerve pain 30 capsule 0  . metFORMIN (GLUCOPHAGE XR) 500 MG 24 hr tablet Take 1 tablet (500 mg total) by mouth daily with breakfast. 90 tablet 3  . oxyCODONE (OXY IR/ROXICODONE) 5 MG immediate release tablet 1 po q 4 hrs prn pain 30 tablet 0  . saccharomyces boulardii (FLORASTOR) 250 MG capsule Take 250 mg by mouth 2 (two) times daily.    . Lancets (ACCU-CHEK SOFT TOUCH) lancets Use as instructed to check sugars once daily  E11.21 100 each 3  . Calcium Carbonate-Vitamin D (CALCIUM 500/VITAMIN D PO) Take 2 tablets by mouth daily.    . fluticasone (FLONASE) 50 MCG/ACT nasal spray USE 2 SPRAYS INTO BOTH  NOSTRILS DAILY AS NEEDED (Patient not taking: Reported on 02/21/2019) 48 g 3  . Krill Oil Omega-3 500 MG CAPS Take 500 mg by mouth daily.     . Multiple Vitamin (MULTIVITAMIN) tablet Take 1 tablet by mouth daily.    . rosuvastatin (CRESTOR) 10 MG tablet Take 1 tablet (10 mg total) by mouth at bedtime. (Patient not taking: Reported on 02/21/2019) 90 tablet 3  . aspirin EC 325 MG EC tablet 1 tab a day for the next 30 days to prevent blood clots 30 tablet 0  . cephALEXin  (KEFLEX) 500 MG capsule Take 500 mg by mouth 4 (four) times daily.    . metoCLOPramide (REGLAN) 5 MG/5ML solution Take 5 mLs (5 mg total) by mouth every 8 (eight) hours as needed for nausea or vomiting. (Patient not taking: Reported on 02/21/2019) 60 mL 0  . polyethylene glycol (MIRALAX / GLYCOLAX) packet 17grams in 6 oz of something to drink twice a day until bowel movement.  LAXITIVE.  Restart if two days since last bowel movement 14 each 0   No facility-administered medications prior to visit.      Per HPI unless specifically indicated in ROS section below Review of Systems Objective:    BP (!) 157/90 (BP Location: Left Arm, Patient Position: Sitting, Cuff Size: Normal)   Pulse 91   Temp 98 F (36.7 C)   Ht 5\' 1"  (1.549 m)   Wt 172 lb 6 oz (78.2 kg)   BMI 32.57 kg/m   Wt Readings from Last 3 Encounters:  02/21/19 172 lb 6 oz (78.2 kg)  01/17/19 178 lb 9.2 oz (81 kg)  01/10/19 180 lb (81.6 kg)     Physical exam: Gen: alert, NAD, not ill appearing Pulm: speaks in complete sentences without increased work of breathing Psych: normal mood, normal thought content      Results for orders placed or performed in visit on 02/18/19  Lipid panel  Result Value Ref Range   Cholesterol 233 (H) 0 - 200 mg/dL   Triglycerides 298.0 (H) 0.0 - 149.0 mg/dL   HDL 37.90 (L) >39.00 mg/dL   VLDL 59.6 (H) 0.0 - 40.0 mg/dL   Total CHOL/HDL Ratio 6    NonHDL 355.73   Basic metabolic panel  Result Value Ref Range   Sodium 140 135 - 145 mEq/L   Potassium 4.3 3.5 - 5.1 mEq/L   Chloride 104 96 - 112 mEq/L   CO2 25 19 - 32 mEq/L   Glucose, Bld 98 70 - 99 mg/dL   BUN 29 (H) 6 - 23 mg/dL   Creatinine, Ser 1.03 0.40 - 1.20 mg/dL   Calcium 10.2 8.4 - 10.5 mg/dL   GFR 52.31 (L) >60.00 mL/min  LDL cholesterol, direct  Result Value Ref Range   Direct LDL 141.0 mg/dL   Assessment & Plan:   Problem List Items Addressed This Visit    Primary localized osteoarthritis of right knee    S/p R TKR  01/2019. Recovering well from ortho standpoint      Relevant Medications   aspirin 81 MG tablet   Obesity, Class I, BMI 30-34.9    Congratulated on wegiht loss to date.       Nausea    After surgery, persist possibly due to recent keflex course.  rec zofran PRN nausea. She desires to avoid reglan due to side effects. Recommended she continue probiotic recently started Designer, television/film set).      LAFB (left anterior fascicular block)    New on latest EKG. Pending echo 4/30.       Relevant Medications   aspirin 81 MG tablet   benazepril (LOTENSIN) 20 MG tablet   hydrochlorothiazide (MICROZIDE) 12.5 MG capsule   HTN (hypertension)    Chronic. Elevated today however normally running well controlled. No changes made. I did ask her to continue to monitor BP routinely and touch base with ortho if persistently >140/90 to start amlodipine 5mg .       Relevant Medications   aspirin 81 MG tablet   benazepril (LOTENSIN) 20 MG tablet   hydrochlorothiazide (MICROZIDE) 12.5 MG capsule   HLD (hyperlipidemia)    Chronic. Deteriorated readings as she has been off fibrate and statin for the past 2 months. Advised restart crestor then consider restarting fibrate.  The 10-year ASCVD risk score Mikey Bussing DC Brooke Bonito., et al., 2013) is: 47.1%   Values used to calculate the score:     Age: 20 years     Sex: Female     Is Non-Hispanic African American: No     Diabetic: Yes     Tobacco smoker: No     Systolic Blood Pressure: 735 mmHg     Is BP treated: Yes     HDL Cholesterol: 37.9 mg/dL     Total Cholesterol: 233 mg/dL       Relevant Medications   aspirin 81 MG tablet   benazepril (LOTENSIN) 20 MG tablet   hydrochlorothiazide (MICROZIDE) 12.5 MG capsule   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 nephropathy (HCC) - Primary    Chronic stable. Continue current regimen.       Relevant Medications   aspirin 81 MG tablet   benazepril (LOTENSIN) 20 MG tablet   CKD stage 3 due to type 2 diabetes mellitus  (HCC)    Chronic, stable. Reviewed importance of hydration status and avoiding nephrotoxic agents.      Relevant Medications   aspirin 81 MG tablet   benazepril (LOTENSIN) 20 MG tablet   RESOLVED: Acute pain of right knee       Meds ordered this encounter  Medications  . Lancets (ACCU-CHEK SOFT TOUCH) lancets    Sig: Use as instructed to check sugars once daily E11.21    Dispense:  100 each    Refill:  3  . ondansetron (ZOFRAN) 4 MG tablet    Sig: Take 1 tablet (4 mg total) by mouth every 8 (eight) hours as needed for nausea or vomiting.    Dispense:  20 tablet    Refill:  0  . benazepril (LOTENSIN) 20 MG tablet    Sig: Take 1 tablet (20 mg total) by mouth daily.    Dispense:  90 tablet    Refill:  1    Hold until next refill due  . hydrochlorothiazide (MICROZIDE) 12.5 MG capsule    Sig: Take 1 capsule (12.5 mg total) by mouth daily.    Dispense:  90 capsule    Refill:  1    Hold until next refill due   No orders of the defined types were placed in this encounter.   Follow up plan: No follow-ups on file.  [name], MD

## 2019-02-23 DIAGNOSIS — M25561 Pain in right knee: Secondary | ICD-10-CM | POA: Diagnosis not present

## 2019-02-23 DIAGNOSIS — R262 Difficulty in walking, not elsewhere classified: Secondary | ICD-10-CM | POA: Diagnosis not present

## 2019-02-23 DIAGNOSIS — M6281 Muscle weakness (generalized): Secondary | ICD-10-CM | POA: Diagnosis not present

## 2019-02-23 DIAGNOSIS — M25661 Stiffness of right knee, not elsewhere classified: Secondary | ICD-10-CM | POA: Diagnosis not present

## 2019-02-28 DIAGNOSIS — N390 Urinary tract infection, site not specified: Secondary | ICD-10-CM | POA: Diagnosis not present

## 2019-02-28 DIAGNOSIS — R262 Difficulty in walking, not elsewhere classified: Secondary | ICD-10-CM | POA: Diagnosis not present

## 2019-02-28 DIAGNOSIS — M25661 Stiffness of right knee, not elsewhere classified: Secondary | ICD-10-CM | POA: Diagnosis not present

## 2019-02-28 DIAGNOSIS — M6281 Muscle weakness (generalized): Secondary | ICD-10-CM | POA: Diagnosis not present

## 2019-02-28 DIAGNOSIS — R350 Frequency of micturition: Secondary | ICD-10-CM | POA: Diagnosis not present

## 2019-02-28 DIAGNOSIS — M25561 Pain in right knee: Secondary | ICD-10-CM | POA: Diagnosis not present

## 2019-03-02 DIAGNOSIS — R262 Difficulty in walking, not elsewhere classified: Secondary | ICD-10-CM | POA: Diagnosis not present

## 2019-03-02 DIAGNOSIS — M25661 Stiffness of right knee, not elsewhere classified: Secondary | ICD-10-CM | POA: Diagnosis not present

## 2019-03-02 DIAGNOSIS — M25561 Pain in right knee: Secondary | ICD-10-CM | POA: Diagnosis not present

## 2019-03-02 DIAGNOSIS — M6281 Muscle weakness (generalized): Secondary | ICD-10-CM | POA: Diagnosis not present

## 2019-03-03 ENCOUNTER — Other Ambulatory Visit: Payer: Medicare Other

## 2019-03-08 DIAGNOSIS — M25561 Pain in right knee: Secondary | ICD-10-CM | POA: Diagnosis not present

## 2019-03-14 DIAGNOSIS — M25561 Pain in right knee: Secondary | ICD-10-CM | POA: Diagnosis not present

## 2019-03-14 DIAGNOSIS — M25661 Stiffness of right knee, not elsewhere classified: Secondary | ICD-10-CM | POA: Diagnosis not present

## 2019-03-14 DIAGNOSIS — R262 Difficulty in walking, not elsewhere classified: Secondary | ICD-10-CM | POA: Diagnosis not present

## 2019-03-14 DIAGNOSIS — M6281 Muscle weakness (generalized): Secondary | ICD-10-CM | POA: Diagnosis not present

## 2019-03-16 DIAGNOSIS — R262 Difficulty in walking, not elsewhere classified: Secondary | ICD-10-CM | POA: Diagnosis not present

## 2019-03-16 DIAGNOSIS — M25661 Stiffness of right knee, not elsewhere classified: Secondary | ICD-10-CM | POA: Diagnosis not present

## 2019-03-16 DIAGNOSIS — M25561 Pain in right knee: Secondary | ICD-10-CM | POA: Diagnosis not present

## 2019-03-16 DIAGNOSIS — M6281 Muscle weakness (generalized): Secondary | ICD-10-CM | POA: Diagnosis not present

## 2019-03-21 DIAGNOSIS — M25561 Pain in right knee: Secondary | ICD-10-CM | POA: Diagnosis not present

## 2019-03-21 DIAGNOSIS — M6281 Muscle weakness (generalized): Secondary | ICD-10-CM | POA: Diagnosis not present

## 2019-03-21 DIAGNOSIS — R350 Frequency of micturition: Secondary | ICD-10-CM | POA: Diagnosis not present

## 2019-03-21 DIAGNOSIS — R262 Difficulty in walking, not elsewhere classified: Secondary | ICD-10-CM | POA: Diagnosis not present

## 2019-03-21 DIAGNOSIS — M25661 Stiffness of right knee, not elsewhere classified: Secondary | ICD-10-CM | POA: Diagnosis not present

## 2019-03-23 DIAGNOSIS — M25661 Stiffness of right knee, not elsewhere classified: Secondary | ICD-10-CM | POA: Diagnosis not present

## 2019-03-23 DIAGNOSIS — R262 Difficulty in walking, not elsewhere classified: Secondary | ICD-10-CM | POA: Diagnosis not present

## 2019-03-23 DIAGNOSIS — M6281 Muscle weakness (generalized): Secondary | ICD-10-CM | POA: Diagnosis not present

## 2019-03-23 DIAGNOSIS — M25561 Pain in right knee: Secondary | ICD-10-CM | POA: Diagnosis not present

## 2019-03-29 DIAGNOSIS — M25561 Pain in right knee: Secondary | ICD-10-CM | POA: Diagnosis not present

## 2019-03-29 DIAGNOSIS — R262 Difficulty in walking, not elsewhere classified: Secondary | ICD-10-CM | POA: Diagnosis not present

## 2019-03-29 DIAGNOSIS — M6281 Muscle weakness (generalized): Secondary | ICD-10-CM | POA: Diagnosis not present

## 2019-03-29 DIAGNOSIS — M25661 Stiffness of right knee, not elsewhere classified: Secondary | ICD-10-CM | POA: Diagnosis not present

## 2019-03-31 DIAGNOSIS — R262 Difficulty in walking, not elsewhere classified: Secondary | ICD-10-CM | POA: Diagnosis not present

## 2019-03-31 DIAGNOSIS — M25661 Stiffness of right knee, not elsewhere classified: Secondary | ICD-10-CM | POA: Diagnosis not present

## 2019-03-31 DIAGNOSIS — M6281 Muscle weakness (generalized): Secondary | ICD-10-CM | POA: Diagnosis not present

## 2019-03-31 DIAGNOSIS — M25561 Pain in right knee: Secondary | ICD-10-CM | POA: Diagnosis not present

## 2019-04-04 HISTORY — PX: TRANSTHORACIC ECHOCARDIOGRAM: SHX275

## 2019-04-05 DIAGNOSIS — R350 Frequency of micturition: Secondary | ICD-10-CM | POA: Diagnosis not present

## 2019-04-05 DIAGNOSIS — M25661 Stiffness of right knee, not elsewhere classified: Secondary | ICD-10-CM | POA: Diagnosis not present

## 2019-04-07 ENCOUNTER — Encounter: Payer: Self-pay | Admitting: Family Medicine

## 2019-04-07 DIAGNOSIS — Z87440 Personal history of urinary (tract) infections: Secondary | ICD-10-CM

## 2019-04-11 ENCOUNTER — Ambulatory Visit (INDEPENDENT_AMBULATORY_CARE_PROVIDER_SITE_OTHER): Payer: Medicare Other | Admitting: Podiatry

## 2019-04-11 ENCOUNTER — Ambulatory Visit (INDEPENDENT_AMBULATORY_CARE_PROVIDER_SITE_OTHER): Payer: Medicare Other

## 2019-04-11 ENCOUNTER — Other Ambulatory Visit: Payer: Self-pay

## 2019-04-11 ENCOUNTER — Encounter: Payer: Self-pay | Admitting: Podiatry

## 2019-04-11 VITALS — Temp 98.7°F

## 2019-04-11 DIAGNOSIS — M722 Plantar fascial fibromatosis: Secondary | ICD-10-CM | POA: Diagnosis not present

## 2019-04-11 DIAGNOSIS — B351 Tinea unguium: Secondary | ICD-10-CM

## 2019-04-11 DIAGNOSIS — M79675 Pain in left toe(s): Secondary | ICD-10-CM

## 2019-04-11 DIAGNOSIS — M79674 Pain in right toe(s): Secondary | ICD-10-CM | POA: Diagnosis not present

## 2019-04-11 MED ORDER — MELOXICAM 7.5 MG PO TABS: 7.5000 mg | ORAL_TABLET | Freq: Every day | ORAL | 1 refills | Status: DC

## 2019-04-11 NOTE — Progress Notes (Signed)
This patient presents to the office with chief complaint of a painful left heel.  She says that she experiences sharp radiating pain from the heel up the back of the leg about last Thursday.  She denies any trauma or injury to the foot.  She says that the severe pain was approximately 8 or 9 last week and has now subsided and her pain level is 4-5 today.  She does have palpable pain noted on her left heel.  She says that she is not experiencing much pain or discomfort when she walks but is exhibiting throbbing pain noted when she is not walking.  She has taken Tylenol and naproxen for pain but the problem persists.  She presents the office today for an evaluation of her left heel.  She also says that she has had knee surgery which prevents her from trimming her own nails and presents the office today for nail care.  This patient is diabetic and takes metformin.  She presents the office today for an evaluation of her heel and a diabetic foot exam.  Vascular  Dorsalis pedis and posterior tibial pulses are palpable  B/L.  Capillary return  WNL.  Temperature gradient is  WNL.  Skin turgor  WNL  Sensorium  Senn Weinstein monofilament wire  WNL. Normal tactile sensation.  Nail Exam  Patient has thick disfigured great toenails both feet which are ingrown.  No evidence of any drainage or fungal infection to her other nails.  Orthopedic  Exam  Muscle tone and muscle strength  WNL.  No limitations of motion feet  B/L.  No crepitus or joint effusion noted.  Foot type is unremarkable and digits show no abnormalities.  Bony prominences are unremarkable.  Skin  No open lesions.  Normal skin texture and turgor.  Diabetes with no complications.   Plantar fasciitis left heel.  IE.  X-rays taken do reveal calcification at the insertion of the plantar fascia and calcification at the insertion of the Achilles tendon left heel.  Debride nails due to her previous knee surgery.  Discussed her left heel pain.  Her heel pain  description sounds like an acute tear in the heel at the plantar fascial insertion.  Based on her history she is getting better daily.  Told her treat self with ice treatment and warm soaks.  This patient does have GERD so therefore I recommended we attempt Mobic 7.5 mg tablets 1 daily in an effort to control her pain.  We discussed applying a soft cast and even considered injection therapy but we will allow her foot to continue to heal itself.  She was also told to wear shoes with good supportive insoles to help take pressure off the plantar fascia.  Return to the clinic as needed   Gardiner Barefoot DPM

## 2019-04-13 ENCOUNTER — Other Ambulatory Visit: Payer: Self-pay

## 2019-04-26 ENCOUNTER — Ambulatory Visit (INDEPENDENT_AMBULATORY_CARE_PROVIDER_SITE_OTHER): Payer: Medicare Other | Admitting: Urology

## 2019-04-26 ENCOUNTER — Encounter: Payer: Self-pay | Admitting: Urology

## 2019-04-26 ENCOUNTER — Other Ambulatory Visit: Payer: Self-pay

## 2019-04-26 VITALS — BP 141/86 | HR 93 | Ht 61.0 in | Wt 172.0 lb

## 2019-04-26 DIAGNOSIS — N390 Urinary tract infection, site not specified: Secondary | ICD-10-CM | POA: Diagnosis not present

## 2019-04-26 LAB — MICROSCOPIC EXAMINATION
RBC, Urine: NONE SEEN /hpf (ref 0–2)
WBC, UA: NONE SEEN /hpf (ref 0–5)

## 2019-04-26 LAB — BLADDER SCAN AMB NON-IMAGING

## 2019-04-26 LAB — URINALYSIS, COMPLETE
Bilirubin, UA: NEGATIVE
Glucose, UA: NEGATIVE
Ketones, UA: NEGATIVE
Leukocytes,UA: NEGATIVE
Nitrite, UA: NEGATIVE
Protein,UA: NEGATIVE
RBC, UA: NEGATIVE
Specific Gravity, UA: 1.015 (ref 1.005–1.030)
Urobilinogen, Ur: 0.2 mg/dL (ref 0.2–1.0)
pH, UA: 5.5 (ref 5.0–7.5)

## 2019-04-26 NOTE — Patient Instructions (Addendum)
Start Cranberry Tablets twice daily   Urinary Tract Infection, Adult A urinary tract infection (UTI) is an infection of any part of the urinary tract. The urinary tract includes:  The kidneys.  The ureters.  The bladder.  The urethra. These organs make, store, and get rid of pee (urine) in the body. What are the causes? This is caused by germs (bacteria) in your genital area. These germs grow and cause swelling (inflammation) of your urinary tract. What increases the risk? You are more likely to develop this condition if:  You have a small, thin tube (catheter) to drain pee.  You cannot control when you pee or poop (incontinence).  You are female, and: ? You use these methods to prevent pregnancy: ? A medicine that kills sperm (spermicide). ? A device that blocks sperm (diaphragm). ? You have low levels of a female hormone (estrogen). ? You are pregnant.  You have genes that add to your risk.  You are sexually active.  You take antibiotic medicines.  You have trouble peeing because of: ? A prostate that is bigger than normal, if you are female. ? A blockage in the part of your body that drains pee from the bladder (urethra). ? A kidney stone. ? A nerve condition that affects your bladder (neurogenic bladder). ? Not getting enough to drink. ? Not peeing often enough.  You have other conditions, such as: ? Diabetes. ? A weak disease-fighting system (immune system). ? Sickle cell disease. ? Gout. ? Injury of the spine. What are the signs or symptoms? Symptoms of this condition include:  Needing to pee right away (urgently).  Peeing often.  Peeing small amounts often.  Pain or burning when peeing.  Blood in the pee.  Pee that smells bad or not like normal.  Trouble peeing.  Pee that is cloudy.  Fluid coming from the vagina, if you are female.  Pain in the belly or lower back. Other symptoms include:  Throwing up (vomiting).  No urge to  eat.  Feeling mixed up (confused).  Being tired and grouchy (irritable).  A fever.  Watery poop (diarrhea). How is this treated? This condition may be treated with:  Antibiotic medicine.  Other medicines.  Drinking enough water. Follow these instructions at home:  Medicines  Take over-the-counter and prescription medicines only as told by your doctor.  If you were prescribed an antibiotic medicine, take it as told by your doctor. Do not stop taking it even if you start to feel better. General instructions  Make sure you: ? Pee until your bladder is empty. ? Do not hold pee for a long time. ? Empty your bladder after sex. ? Wipe from front to back after pooping if you are a female. Use each tissue one time when you wipe.  Drink enough fluid to keep your pee pale yellow.  Keep all follow-up visits as told by your doctor. This is important. Contact a doctor if:  You do not get better after 1-2 days.  Your symptoms go away and then come back. Get help right away if:  You have very bad back pain.  You have very bad pain in your lower belly.  You have a fever.  You are sick to your stomach (nauseous).  You are throwing up. Summary  A urinary tract infection (UTI) is an infection of any part of the urinary tract.  This condition is caused by germs in your genital area.  There are many risk factors for a  UTI. These include having a small, thin tube to drain pee and not being able to control when you pee or poop.  Treatment includes antibiotic medicines for germs.  Drink enough fluid to keep your pee pale yellow. This information is not intended to replace advice given to you by your health care provider. Make sure you discuss any questions you have with your health care provider. Document Released: 04/07/2008 Document Revised: 04/29/2018 Document Reviewed: 04/29/2018 Elsevier Interactive Patient Education  2019 [employer].

## 2019-04-26 NOTE — Progress Notes (Signed)
04/26/2019 10:26 AM   Katherine Oliver May 18, 1946 [id number]  Referring provider: [name], MD [place],   [id number]  CC: Recurrent UTIs  HPI: I saw [name] in urology clinic today in consultation for recurrent UTIs from Dr. Danise Oliver.  She is a relatively healthy 73 year old female that reports 2 UTIs after undergoing hip replacement in March 2020.  I do not have access to her prior cultures, aside from a single positive culture from 04/08/2023 50-100K enterococcus, sensitive to ampicillin, nitrofurantoin, and vancomycin.  Notably, she was treated with antibiotics preoperatively for asymptomatic bacteriuria.  She states she typically gets 1-2 UTIs every few years.  She did have recurrent UTIs for short time after getting married, and was previously on low-dose daily Septra by a urologist in [place] many years ago.  Her symptoms with UTIs are typically dysuria, urgency, frequency, pelvic pain, and fevers.  There are no aggravating or alleviating factors.  Severity is moderate.  She denies a smoking history.  She previously worked as a Pharmacist, hospital.  She denies any gross hematuria or flank pain.    PVR in clinic today is 47 cc.  Urinalysis is benign with 0 WBCs, 0 RBCs, few bacteria, nitrite negative   PMH: Past Medical History:  Diagnosis Date  . Allergic rhinitis   . Arthritis   . 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 nephropathy ([place])    DSME at [facility] 01/2016   . GERD (gastroesophageal reflux disease)   . Hepatic steatosis    by abd [place] 05/2012, mild transaminitis - normal iron sat and viral hep panel (2011), stable [place] 2017  . History of chicken pox   . History of measles   . History of recurrent UTIs    on chronic keflex  . HLD (hyperlipidemia)   . HTN (hypertension)   . Hypertensive retinopathy of both eyes,  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   [facility]  . Kidney cyst, acquired 01/2016   L kidney by [place]  . Kidney stone 01/2016   L kidney by [place]  . Lung nodules 11/2013   overall stable on f/u CT 01/2016  . Osteopenia 06/2013   mild, forearm T -1.1, hip and spine WNL  . Polycythemia    mild, stable (2013)  . Primary localized osteoarthritis of right knee 01/05/2019  . Rosacea    metrogel    Surgical History: Past Surgical History:  Procedure Laterality Date  . APPENDECTOMY  1987  . BREAST BIOPSY Right 1963   benign  . BREAST BIOPSY Right 08/2014   benign- core  . cardiolite stress test  04/2004   normal  . CESAREAN SECTION  2119;4174   x2  . CHOLECYSTECTOMY  2003  . COLONOSCOPY  09/26/2008   adenomatous polyp, rpt 3-5 yrs  . COLONOSCOPY  08/2012   adenomatous polyps, diverticulosis, rec rpt 5 yrs [name])  . COLONOSCOPY WITH PROPOFOL N/A 02/05/2018   4TA, SSA, diverticulosis, rpt 3 yrs [name], [name], MD)  . dexa  2003   normal  . dexa  06/2013   [facility] - Tscore -1.1 forearm, normal spine and femur  . 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 TRIGGER FINGER RELEASE  2007;2010;2011   bilateral  .  TRIGGER FINGER RELEASE  02/2017  . VAGINAL HYSTERECTOMY  1984   for menorrhagia, ovaries in place    Allergies:  Allergies  Allergen Reactions  . Azithromycin Itching    Okay if takes benadryl along with it  . Nickel     Reaction to cheap earrings  . Sulfa Antibiotics   . Adhesive [Tape] Rash    Paper tape - blisters    Family History: Family History  Problem Relation Age of Onset  . Stroke Mother        several  . Hyperlipidemia Mother   . Hypertension Mother   . Cancer Father        colon  . Hypertension Father   . Hyperlipidemia Father   . Coronary artery disease Father 41       MIx1, CABG  . Cancer Paternal Aunt        abdominal  . Coronary artery  disease Maternal Grandmother   . Diabetes Maternal Grandfather   . Coronary artery disease Maternal Grandfather   . Breast cancer Neg Hx     Social History:  reports that she has never smoked. She has never used smokeless tobacco. She reports previous alcohol use of about 1.0 - 2.0 standard drinks of alcohol per week. She reports that she does not use drugs.  ROS: Please see flowsheet from today's date for complete review of systems.  Physical Exam: BP (!) 141/86 (BP Location: Left Arm, Patient Position: Sitting)   Pulse 93   Ht 5\' 1"  (1.549 m)   Wt 172 lb (78 kg)   BMI 32.50 kg/m    Constitutional:  Alert and oriented, No acute distress. Cardiovascular: No clubbing, cyanosis, or edema. Respiratory: Normal respiratory effort, no increased work of breathing. GI: Abdomen is soft, nontender, nondistended, no abdominal masses GU: Urethra widely patent, no masses or lesions.  Mild tissue atrophy. Lymph: No cervical or inguinal lymphadenopathy. Skin: No rashes, bruises or suspicious lesions. Neurologic: Grossly intact, no focal deficits, moving all 4 extremities. Psychiatric: Normal mood and affect.  Laboratory Data: Urinalysis today 0 WBCs, 0 RBCs, few bacteria, nitrite negative  Pertinent Imaging: No cross-sectional imaging to review  Assessment & Plan:   In summary, the patient is a relatively healthy 73 year old female with a distant history of recurrent UTIs previously on daily Septra in the distant past, who developed 2 urinary tract infections after recently undergoing hip replacement.  We discussed the evaluation and treatment of patients with recurrent UTIs at length.  We specifically discussed the differences between asymptomatic bacteriuria and true urinary tract infection.  We discussed the AUA definition of recurrent UTI of at least 2 culture proven symptomatic acute cystitis episodes in a 43-month period, or 3 within a 1 year period.  We discussed the importance of  culture directed antibiotic treatment, and antibiotic stewardship.  First-line therapy includes nitrofurantoin(5 days), Bactrim(3 days), or fosfomycin(3 g single dose).  Possible etiologies of recurrent infection include periurethral tissue atrophy in postmenopausal woman, constipation, sexual activity, incomplete emptying, anatomic abnormalities, and even genetic predisposition.  Finally, we discussed the role of perineal hygiene, timed voiding, adequate hydration, topical vaginal estrogen, cranberry prophylaxis, and low-dose antibiotic prophylaxis.  Cranberry tablets twice daily Trial of topical vaginal estrogen cream RTC 4 months for symptom check Consider nitrofurantoin prophylaxis in the future if ongoing UTIs  Billey Co, MD  [facility] [address] [place], [place] [address] [phone]

## 2019-04-28 ENCOUNTER — Other Ambulatory Visit: Payer: Medicare Other

## 2019-05-03 ENCOUNTER — Telehealth: Payer: Self-pay

## 2019-05-03 NOTE — Telephone Encounter (Signed)
    COVID-19 Pre-Screening Questions:  . In the past 7 to 10 days have you had a cough, shortness of breath, headache, congestion, fever (100 or greater), body aches, chills, sore throat, or sudden loss of taste or sense of smell? NO . Have you been around anyone with known Covid 19? NO . Have you been around anyone who is awaiting Covid 19 test results in the past 7 to 10 days? NO . Have you been around anyone who has been exposed to Covid 19, or has mentioned symptoms of Covid 19 within the past 7 to 10 days? NO  If you have any concerns/questions about symptoms patients report during screening (either on the phone or at threshold). Contact the provider seeing the patient or DOD for further guidance.  If neither are available contact a member of the leadership team.            

## 2019-05-04 ENCOUNTER — Other Ambulatory Visit: Payer: Self-pay

## 2019-05-04 ENCOUNTER — Ambulatory Visit (INDEPENDENT_AMBULATORY_CARE_PROVIDER_SITE_OTHER): Payer: Medicare Other

## 2019-05-04 DIAGNOSIS — I444 Left anterior fascicular block: Secondary | ICD-10-CM

## 2019-05-04 MED ORDER — PERFLUTREN LIPID MICROSPHERE
1.0000 mL | INTRAVENOUS | Status: AC | PRN
  Administered 2019-05-04: 2 mL via INTRAVENOUS

## 2019-05-09 ENCOUNTER — Encounter: Payer: Self-pay | Admitting: Family Medicine

## 2019-05-16 ENCOUNTER — Other Ambulatory Visit: Payer: Self-pay

## 2019-05-16 ENCOUNTER — Ambulatory Visit (INDEPENDENT_AMBULATORY_CARE_PROVIDER_SITE_OTHER): Payer: Medicare Other | Admitting: Family Medicine

## 2019-05-16 ENCOUNTER — Encounter: Payer: Self-pay | Admitting: Family Medicine

## 2019-05-16 VITALS — BP 122/78 | HR 95 | Temp 98.2°F | Ht 61.0 in | Wt 174.6 lb

## 2019-05-16 DIAGNOSIS — R040 Epistaxis: Secondary | ICD-10-CM | POA: Diagnosis not present

## 2019-05-16 HISTORY — DX: Epistaxis: R04.0

## 2019-05-16 NOTE — Progress Notes (Signed)
This visit was conducted in person.  BP 122/78 (BP Location: Left Arm, Patient Position: Sitting, Cuff Size: Normal)   Pulse 95   Temp 98.2 F (36.8 C) (Temporal)   Ht 5\' 1"  (1.549 m)   Wt 174 lb 9 oz (79.2 kg)   SpO2 93%   BMI 32.98 kg/m    CC: nosebleeds Subjective:    Patient ID: Katherine Oliver, female    DOB: 02-23-1946, 73 y.o.   MRN: [id number]  HPI: Katherine Oliver is a 73 y.o. female presenting on 05/16/2019 for Epistaxis (C/o more frequent nosebleeds. Had had 1 every weekend for last 3 weeks.  Request ENT referral with Dr. Carmin Richmond.)   Several nosebleeds in the last few weeks. One per weekend for last 3 wkends - always on R side.  Had nosebleed 09/2018 seen at ER treated with nose clamp and afrin with benefit. She does take aspirin 81mg  daily (actually stopped this last week). she stopped meloxicam 7.5mg  2 wks ago. She is not currently taking krill oil omega-3.  She is currently not using flonase. She is currently using benadryl twice daily. No other easy bruising/bleeding noted. No blood in stool or urine.   Requests ENT referral ([facility]).   Recently evaluated by urology [facility]) for several UTIs after recent knee  replacement surgery 01/2019. rec cranberry tablets BID, topical vaginal estrogen cream (unclear if the latter was prescribed).      Relevant past medical, surgical, family and social history reviewed and updated as indicated. Interim medical history since our last visit reviewed. Allergies and medications reviewed and updated. Outpatient Medications Prior to Visit  Medication Sig Dispense Refill  . ACCU-CHEK AVIVA PLUS test strip USE AS DIRECTED TO CHECK BLOOD SUGARS ONCE DAILY E11.21 100 each 7  . acetaminophen (TYLENOL) 500 MG tablet Take 1,000 mg by mouth every 6 (six) hours as needed for moderate pain.     . benazepril (LOTENSIN) 20 MG tablet Take 1 tablet (20 mg total) by mouth daily. 90 tablet 1  . diphenhydrAMINE (BENADRYL) 25  MG tablet Take 25 mg by mouth 2 (two) times daily as needed for allergies.     . fluticasone (FLONASE) 50 MCG/ACT nasal spray USE 2 SPRAYS INTO BOTH  NOSTRILS DAILY AS NEEDED 48 g 3  . hydrochlorothiazide (MICROZIDE) 12.5 MG capsule Take 1 capsule (12.5 mg total) by mouth daily. 90 capsule 1  . Lancets (ACCU-CHEK SOFT TOUCH) lancets Use as instructed to check sugars once daily E11.21 100 each 3  . metFORMIN (GLUCOPHAGE XR) 500 MG 24 hr tablet Take 1 tablet (500 mg total) by mouth daily with breakfast. 90 tablet 3  . Multiple Vitamins-Minerals (CENTRUM SILVER PO) Take 1 tablet by mouth daily.    Marland Kitchen oxymetazoline (AFRIN) 0.05 % nasal spray Place 2 sprays into both nostrils 2 (two) times daily.    Marland Kitchen aspirin 81 MG tablet Take 81 mg by mouth daily.     Docker Oil Omega-3 500 MG CAPS Take 500 mg by mouth daily.     . Calcium Carbonate-Vitamin D (CALCIUM 500/VITAMIN D PO) Take 2 tablets by mouth daily.    . cefUROXime (CEFTIN) 500 MG tablet TAKE 1 TABLET BY MOUTH TWICE A DAY FOR BLADDER INFECTION    . fenofibrate 160 MG tablet Take 1 tablet (160 mg total) by mouth daily. 90 tablet 3  . meloxicam (MOBIC) 7.5 MG tablet Take 1 tablet (7.5 mg total) by mouth daily. 30 tablet 1  . meloxicam (MOBIC) 7.5  MG tablet     . Multiple Vitamin (MULTIVITAMIN) tablet Take 1 tablet by mouth daily.    . ondansetron (ZOFRAN) 4 MG tablet Take 1 tablet (4 mg total) by mouth every 8 (eight) hours as needed for nausea or vomiting. 20 tablet 0  . oxyCODONE (OXY IR/ROXICODONE) 5 MG immediate release tablet 1 po q 4 hrs prn pain 30 tablet 0  . saccharomyces boulardii (FLORASTOR) 250 MG capsule Take 250 mg by mouth 2 (two) times daily.     No facility-administered medications prior to visit.      Per HPI unless specifically indicated in ROS section below Review of Systems Objective:    BP 122/78 (BP Location: Left Arm, Patient Position: Sitting, Cuff Size: Normal)   Pulse 95   Temp 98.2 F (36.8 C) (Temporal)   Ht 5\' 1"   (1.549 m)   Wt 174 lb 9 oz (79.2 kg)   SpO2 93%   BMI 32.98 kg/m   Wt Readings from Last 3 Encounters:  05/16/19 174 lb 9 oz (79.2 kg)  04/26/19 172 lb (78 kg)  02/21/19 172 lb 6 oz (78.2 kg)    Physical Exam Vitals signs and nursing note reviewed.  Constitutional:      General: She is not in acute distress.    Appearance: Normal appearance. She is not ill-appearing.  HENT:     Nose: Mucosal edema (mild erythema bilaterally) present.     Right Nostril: No epistaxis or septal hematoma.     Left Nostril: No epistaxis or septal hematoma.     Comments: No obvious source of bleed noted    Mouth/Throat:     Mouth: Mucous membranes are moist.     Pharynx: No posterior oropharyngeal erythema.  Eyes:     Conjunctiva/sclera: Conjunctivae normal.     Pupils: Pupils are equal, round, and reactive to light.  Neurological:     Mental Status: She is alert.       Assessment & Plan:   Problem List Items Addressed This Visit    Frequent epistaxis - Primary    Recurrent over last 3 wks. Each time responding to afrin nasal spray but then recurs. No obvious anterior source of bleeding noted - will refer to ENT for further eval/management.       Relevant Orders   Ambulatory referral to ENT       No orders of the defined types were placed in this encounter.  Orders Placed This Encounter  Procedures  . Ambulatory referral to ENT    Referral Priority:   Routine    Referral Type:   Consultation    Referral Reason:   Specialty Services Required    Requested Specialty:   Otolaryngology    Number of Visits Requested:   1    Patient Instructions  Try to limit benadryl and flonase for now. Use nasal saline into nostrils daily. We will refer you to Dr Pryor Ochoa ENT.    Follow up plan: No follow-ups on file.  [name], MD

## 2019-05-16 NOTE — Assessment & Plan Note (Addendum)
Recurrent over last 3 wks. Each time responding to afrin nasal spray but then recurs. No obvious anterior source of bleeding noted - will refer to ENT for further eval/management.

## 2019-05-16 NOTE — Patient Instructions (Signed)
Try to limit benadryl and flonase for now. Use nasal saline into nostrils daily. We will refer you to Dr Pryor Ochoa ENT.

## 2019-05-28 ENCOUNTER — Other Ambulatory Visit: Payer: Self-pay | Admitting: Family Medicine

## 2019-06-01 ENCOUNTER — Encounter: Payer: Self-pay | Admitting: Family Medicine

## 2019-06-01 ENCOUNTER — Ambulatory Visit (INDEPENDENT_AMBULATORY_CARE_PROVIDER_SITE_OTHER): Payer: Medicare Other | Admitting: Family Medicine

## 2019-06-01 ENCOUNTER — Other Ambulatory Visit: Payer: Self-pay

## 2019-06-01 VITALS — BP 113/68 | HR 91 | Temp 98.4°F | Ht 61.0 in | Wt 169.3 lb

## 2019-06-01 DIAGNOSIS — R197 Diarrhea, unspecified: Secondary | ICD-10-CM

## 2019-06-01 DIAGNOSIS — K529 Noninfective gastroenteritis and colitis, unspecified: Secondary | ICD-10-CM | POA: Insufficient documentation

## 2019-06-01 DIAGNOSIS — R6889 Other general symptoms and signs: Secondary | ICD-10-CM | POA: Diagnosis not present

## 2019-06-01 DIAGNOSIS — Z20822 Contact with and (suspected) exposure to covid-19: Secondary | ICD-10-CM

## 2019-06-01 NOTE — Progress Notes (Unsigned)
lab

## 2019-06-01 NOTE — Progress Notes (Signed)
Virtual visit attempted through [employer]. Due to national recommendations of social distancing due to COVID-19, a virtual visit is felt to be most appropriate for this patient at this time. Reviewed limitations of a virtual visit. Interactive audio and video telecommunications were attempted between myself and Dao Mearns, however failed due to patient having technical difficulties. We continued and completed visit with audio only.  Time: 9:17am - 9:27am   Patient location: home, husband also on call Provider location: [place] [facility], office If any vitals were documented, they were collected by patient at home unless specified below.    BP 113/68   Pulse 91   Temp 98.4 F (36.9 C)   Ht 5\' 1"  (1.549 m)   Wt 169 lb 5 oz (76.8 kg)   BMI 31.99 kg/m    CC: abd pain, loose stools Subjective:    Patient ID: Damian Leavell, female    DOB: December 25, 1945, 73 y.o.   MRN: [id number]  HPI: Armonie Mettler is a 73 y.o. female presenting on 06/01/2019 for Abdominal Pain (C/o abd pain and loose stools.  Denies any nausea, vomiting or fever. Sxs started on 05/29/19. )   3d h/o upper abd pain associated with loose stools. Pain is dull, but gets sharp with deep breaths. Abd pain has improved. Heating pad helps relieve it. She also tried pepto bismol and pepcid and tums with benefit.  No fevers, nausea/vomiting.  No cough, dypsnea, HA, congestion, ST, body aches.  Denies new restaurants or foods.  No known sick contacts.       Relevant past medical, surgical, family and social history reviewed and updated as indicated. Interim medical history since our last visit reviewed. Allergies and medications reviewed and updated. Outpatient Medications Prior to Visit  Medication Sig Dispense Refill  . ACCU-CHEK AVIVA PLUS test strip USE AS DIRECTED TO CHECK BLOOD SUGARS ONCE DAILY E11.21 100 each 7  . acetaminophen (TYLENOL) 500 MG tablet Take 1,000 mg by mouth every  6 (six) hours as needed for moderate pain.     Marland Kitchen aspirin 81 MG tablet Take 81 mg by mouth daily.    . benazepril (LOTENSIN) 20 MG tablet Take 1 tablet (20 mg total) by mouth daily. 90 tablet 1  . Ca Phosphate-Cholecalciferol (CALCIUM/VITAMIN D3 GUMMIES PO) Take by mouth daily.    . diphenhydrAMINE (BENADRYL) 25 MG tablet Take 25 mg by mouth 2 (two) times daily as needed for allergies.     . fluticasone (FLONASE) 50 MCG/ACT nasal spray USE 2 SPRAYS INTO BOTH  NOSTRILS DAILY AS NEEDED 48 g 3  . hydrochlorothiazide (MICROZIDE) 12.5 MG capsule Take 1 capsule (12.5 mg total) by mouth daily. 90 capsule 1  . Krill Oil Omega-3 500 MG CAPS Take 500 mg by mouth daily.     . Lancets (ACCU-CHEK SOFT TOUCH) lancets Use as instructed to check sugars once daily E11.21 100 each 3  . metFORMIN (GLUCOPHAGE XR) 500 MG 24 hr tablet Take 1 tablet (500 mg total) by mouth daily with breakfast. 90 tablet 3  . Multiple Vitamins-Minerals (CENTRUM SILVER PO) Take 1 tablet by mouth daily.    Marland Kitchen oxymetazoline (AFRIN) 0.05 % nasal spray Place 2 sprays into both nostrils 2 (two) times daily.     No facility-administered medications prior to visit.      Per HPI unless specifically indicated in ROS section below Review of Systems Objective:    BP 113/68   Pulse 91   Temp 98.4 F (36.9  C)   Ht 5\' 1"  (1.549 m)   Wt 169 lb 5 oz (76.8 kg)   BMI 31.99 kg/m   Wt Readings from Last 3 Encounters:  06/01/19 169 lb 5 oz (76.8 kg)  05/16/19 174 lb 9 oz (79.2 kg)  04/26/19 172 lb (78 kg)     Physical exam: Pulm: speaks in complete sentences without increased work of breathing Psych: normal mood, normal thought content       Assessment & Plan:   Problem List Items Addressed This Visit    Diarrhea - Primary    Upper abd pain associated with loose stools for the past 3 days - anticipate viral gastroenteritis. Husband also sick as well. In setting of covid pandemic, will check Covid [place] - they will drive there now to get  tested. Reviewed liquid diet, bland diet, importance of good hydration status. Fortunately she seems to be improving already.       Relevant Orders   Novel Coronavirus, NAA ([facility])       No orders of the defined types were placed in this encounter.  Orders Placed This Encounter  Procedures  . Novel Coronavirus, NAA ([facility])    Order Specific Question:   Known Exposure    Answer:   No    I discussed the assessment and treatment plan with the patient. The patient was provided an opportunity to ask questions and all were answered. The patient agreed with the plan and demonstrated an understanding of the instructions. The patient was advised to call back or seek an in-person evaluation if the symptoms worsen or if the condition fails to improve as anticipated.  Follow up plan: No follow-ups on file.  [name], MD

## 2019-06-01 NOTE — Assessment & Plan Note (Signed)
Upper abd pain associated with loose stools for the past 3 days - anticipate viral gastroenteritis. Husband also sick as well. In setting of covid pandemic, will check Covid [facility] - they will drive there now to get tested. Reviewed liquid diet, bland diet, importance of good hydration status. Fortunately she seems to be improving already.

## 2019-06-02 NOTE — Telephone Encounter (Signed)
Spoke with pt asking if she still takes meds.  Says they were stopped before her surgery but she has resumed them both.    E-scribed refills

## 2019-06-03 LAB — NOVEL CORONAVIRUS, NAA: SARS-CoV-2, NAA: NOT DETECTED

## 2019-06-13 ENCOUNTER — Encounter: Payer: Self-pay | Admitting: Physician Assistant

## 2019-06-13 ENCOUNTER — Other Ambulatory Visit: Payer: Self-pay

## 2019-06-13 ENCOUNTER — Ambulatory Visit (INDEPENDENT_AMBULATORY_CARE_PROVIDER_SITE_OTHER): Payer: Medicare Other | Admitting: Physician Assistant

## 2019-06-13 VITALS — BP 133/81 | HR 92 | Ht 61.0 in | Wt 170.0 lb

## 2019-06-13 DIAGNOSIS — R3915 Urgency of urination: Secondary | ICD-10-CM

## 2019-06-13 DIAGNOSIS — N390 Urinary tract infection, site not specified: Secondary | ICD-10-CM

## 2019-06-13 DIAGNOSIS — R300 Dysuria: Secondary | ICD-10-CM | POA: Diagnosis not present

## 2019-06-13 DIAGNOSIS — R350 Frequency of micturition: Secondary | ICD-10-CM

## 2019-06-13 DIAGNOSIS — N3000 Acute cystitis without hematuria: Secondary | ICD-10-CM

## 2019-06-13 LAB — MICROSCOPIC EXAMINATION: RBC, Urine: NONE SEEN /hpf (ref 0–2)

## 2019-06-13 LAB — URINALYSIS, COMPLETE
Bilirubin, UA: NEGATIVE
Glucose, UA: NEGATIVE
Ketones, UA: NEGATIVE
Nitrite, UA: POSITIVE — AB
Protein,UA: NEGATIVE
RBC, UA: NEGATIVE
Specific Gravity, UA: 1.02 (ref 1.005–1.030)
Urobilinogen, Ur: 1 mg/dL (ref 0.2–1.0)
pH, UA: 6.5 (ref 5.0–7.5)

## 2019-06-13 MED ORDER — NITROFURANTOIN MONOHYD MACRO 100 MG PO CAPS: 100.0000 mg | ORAL_CAPSULE | Freq: Two times a day (BID) | ORAL | 0 refills | Status: AC

## 2019-06-13 NOTE — Progress Notes (Signed)
06/13/2019 10:37 AM   [name] 05-06-1946 [id number]  CC: Dysuria, frequency, urgency  HPI: Katherine Oliver is a 73 y.o. female who presents today for evaluation of possible UTI. She is an established [facility] patient who last saw Dr. Diamantina Providence on 04/26/2019 for initial evaluation of recurrent UTIs. She was counseled to start cranberry supplementation and prescribed topical vaginal estrogen cream at that time.  She reports a 3-day history of dysuria, frequency, and urgency. She denies nausea, vomiting, fevers, chills, flank pain, and gross hematuria.  She has taken Azo at home with symptom palliation, most recently Sunday morning.  She reports compliance with cranberry supplementation but states that she has not yet started topical vaginal estrogen cream, citing concerns for bleeding.  She states that her mother underwent hormone replacement therapy after menopause and experienced a hemorrhage following an injection of estrogen.  She does have a history of recurrent UTI symptoms since undergoing hip replacement surgery in March 2020, most recently in June 2020 and treated with a cephalosporin.  She also has a history of asymptomatic bacteriuria that was treated perioperatively.  She is not on daily prophylaxis.  Typically, she reports dysuria, urgency, frequency, pelvic pain, and fevers with UTIs.  In-office UA today positive for 1+ leukocyte esterase and nitrites; urine microscopy with 11-30 WBCs/HPF.  PMH: Past Medical History:  Diagnosis Date  . Allergic rhinitis   . Arthritis   . 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 nephropathy ([place])    DSME at [facility] 01/2016   . GERD (gastroesophageal reflux disease)   . Hepatic steatosis    by abd [place] 05/2012, mild transaminitis - normal iron sat and viral hep panel (2011), stable [place] 2017  . History  of chicken pox   . History of measles   . History of recurrent UTIs    on chronic keflex  . HLD (hyperlipidemia)   . HTN (hypertension)   . 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 Kidney cyst, acquired 01/2016   L kidney by [place]  . Kidney stone 01/2016   L kidney by [place]  . Lung nodules 11/2013   overall stable on f/u CT 01/2016  . Osteopenia 06/2013   mild, forearm T -1.1, hip and spine WNL  . Polycythemia    mild, stable (2013)  . Primary localized osteoarthritis of right knee 01/05/2019  . Rosacea    metrogel    Surgical History: Past Surgical History:  Procedure Laterality Date  . APPENDECTOMY  1987  . BREAST BIOPSY Right 1963   benign  . BREAST BIOPSY Right 08/2014   benign- core  . cardiolite stress test  04/2004   normal  . CESAREAN SECTION  7517;0017   x2  . CHOLECYSTECTOMY  2003  . COLONOSCOPY  09/26/2008   adenomatous polyp, rpt 3-5 yrs  . COLONOSCOPY  08/2012   adenomatous polyps, diverticulosis, rec rpt 5 yrs [name])  . COLONOSCOPY WITH PROPOFOL N/A 02/05/2018   4TA, SSA, diverticulosis, rpt 3 yrs [name], [name], MD)  . dexa  2003   normal  . dexa  06/2013   [facility] - Tscore -1.1 forearm, normal spine and femur  . 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  TRANSTHORACIC ECHOCARDIOGRAM  04/2019   EF 55-60%, modLVH, impaired relaxation   . TRIGGER FINGER RELEASE  2007;2010;2011   bilateral  . TRIGGER FINGER RELEASE  02/2017  . VAGINAL HYSTERECTOMY  1984   for menorrhagia, ovaries in place    Home Medications:  Allergies as of 06/13/2019      Reactions   Azithromycin Itching   Okay if takes benadryl along with it   Nickel    Reaction to cheap earrings   Sulfa Antibiotics    Adhesive [tape] Rash   Paper tape - blisters      Medication List       Accurate as of June 13, 2019 10:37 AM. If  you have any questions, ask your nurse or doctor.        Accu-Chek Aviva Plus test strip Generic drug: glucose blood USE AS DIRECTED TO CHECK BLOOD SUGARS ONCE DAILY E11.21   accu-chek soft touch lancets Use as instructed to check sugars once daily E11.21   acetaminophen 500 MG tablet Commonly known as: TYLENOL Take 1,000 mg by mouth every 6 (six) hours as needed for moderate pain.   amLODipine 10 MG tablet Commonly known as: NORVASC TAKE 1 TABLET BY MOUTH  DAILY   aspirin 81 MG tablet Take 81 mg by mouth daily.   benazepril 20 MG tablet Commonly known as: LOTENSIN Take 1 tablet (20 mg total) by mouth daily.   CALCIUM/VITAMIN D3 GUMMIES PO Take by mouth daily.   CENTRUM SILVER PO Take 1 tablet by mouth daily.   diphenhydrAMINE 25 MG tablet Commonly known as: BENADRYL Take 25 mg by mouth 2 (two) times daily as needed for allergies.   fluticasone 50 MCG/ACT nasal spray Commonly known as: FLONASE USE 2 SPRAYS INTO BOTH  NOSTRILS DAILY AS NEEDED   hydrochlorothiazide 12.5 MG capsule Commonly known as: MICROZIDE Take 1 capsule (12.5 mg total) by mouth daily.   Krill Oil Omega-3 500 MG Caps Take 500 mg by mouth daily.   metFORMIN 500 MG 24 hr tablet Commonly known as: Glucophage XR Take 1 tablet (500 mg total) by mouth daily with breakfast.   nitrofurantoin (macrocrystal-monohydrate) 100 MG capsule Commonly known as: MACROBID Take 1 capsule (100 mg total) by mouth every 12 (twelve) hours for 5 days. Started by: Debroah Loop, PA-C   oxymetazoline 0.05 % nasal spray Commonly known as: AFRIN Place 2 sprays into both nostrils 2 (two) times daily.   rosuvastatin 10 MG tablet Commonly known as: CRESTOR TAKE 1 TABLET BY MOUTH AT  BEDTIME       Allergies:  Allergies  Allergen Reactions  . Azithromycin Itching    Okay if takes benadryl along with it  . Nickel     Reaction to cheap earrings  . Sulfa Antibiotics   . Adhesive [Tape] Rash    Paper  tape - blisters    Family History: Family History  Problem Relation Age of Onset  . Stroke Mother        several  . Hyperlipidemia Mother   . Hypertension Mother   . Cancer Father        colon  . Hypertension Father   . Hyperlipidemia Father   . Coronary artery disease Father 84       MIx1, CABG  . Cancer Paternal Aunt        abdominal  . Coronary artery disease Maternal Grandmother   . Diabetes Maternal Grandfather   . Coronary artery disease Maternal Grandfather   . Breast cancer  Neg Hx     Social History:   reports that she has never smoked. She has never used smokeless tobacco. She reports previous alcohol use of about 1.0 - 2.0 standard drinks of alcohol per week. She reports that she does not use drugs.  ROS: UROLOGY Frequent Urination?: Yes Hard to postpone urination?: Yes Burning/pain with urination?: Yes Get up at night to urinate?: Yes Leakage of urine?: No Urine stream starts and stops?: No Trouble starting stream?: No Do you have to strain to urinate?: No Blood in urine?: No Urinary tract infection?: Yes Sexually transmitted disease?: No Injury to kidneys or bladder?: No Painful intercourse?: No Weak stream?: No Currently pregnant?: No Vaginal bleeding?: No Last menstrual period?: n  Gastrointestinal Nausea?: No Vomiting?: No Indigestion/heartburn?: No Diarrhea?: No Constipation?: No  Constitutional Fever: No Night sweats?: No Weight loss?: No Fatigue?: No  Skin Skin rash/lesions?: No Itching?: No  Eyes Blurred vision?: No Double vision?: No  Ears/Nose/Throat Sore throat?: No Sinus problems?: No  Hematologic/Lymphatic Swollen glands?: No Easy bruising?: No  Cardiovascular Leg swelling?: No Chest pain?: No  Respiratory Cough?: No Shortness of breath?: No  Endocrine Excessive thirst?: No  Musculoskeletal Back pain?: No Joint pain?: No  Neurological Headaches?: No Dizziness?: No  Psychologic Depression?: No  Anxiety?: No  Physical Exam: BP 133/81 (BP Location: Left Arm, Patient Position: Sitting, Cuff Size: Normal)   Pulse 92   Ht 5\' 1"  (1.549 m)   Wt 170 lb (77.1 kg)   BMI 32.12 kg/m   Constitutional:  Alert and oriented, no acute distress, nontoxic appearing HEENT: Pinch, AT Cardiovascular: No clubbing, cyanosis, or edema Respiratory: Normal respiratory effort, no increased work of breathing Skin: No rashes, bruises or suspicious lesions Neurologic: Grossly intact, no focal deficits, moving all 4 extremities Psychiatric: Normal mood and affect  Laboratory Data: Results for orders placed or performed in visit on 06/13/19  Microscopic Examination   URINE  Result Value Ref Range   WBC, UA 11-30 (A) 0 - 5 /hpf   RBC None seen 0 - 2 /hpf   Epithelial Cells (non renal) 0-10 0 - 10 /hpf   Bacteria, UA Few None seen/Few  Urinalysis, Complete  Result Value Ref Range   Specific Gravity, UA 1.020 1.005 - 1.030   pH, UA 6.5 5.0 - 7.5   Color, UA Yellow Yellow   Appearance Ur Cloudy (A) Clear   Leukocytes,UA 1+ (A) Negative   Protein,UA Negative Negative/Trace   Glucose, UA Negative Negative   Ketones, UA Negative Negative   RBC, UA Negative Negative   Bilirubin, UA Negative Negative   Urobilinogen, Ur 1.0 0.2 - 1.0 mg/dL   Nitrite, UA Positive (A) Negative   Microscopic Examination See below:    Assessment & Plan:   1. Acute cystitis without hematuria Patient with symptoms and UA concerning for acute cystitis.  She denies symptoms of upper tract involvement.  Will treat with antibiotics today.  Urine culture pending.  Will defer to Dr. Diamantina Providence regarding antibiotic prophylaxis at her scheduled follow-up appointment with him on 08/25/2019.  I do not believe this is necessary at this time, given that she has not yet started topical estrogen therapy.  I counseled the patient that topical estrogen creams do not undergo the same systemic absorption as injected estrogen.  Stated that the  risk of hemorrhage with this formulation is extremely low.  Patient expressed an understanding of this and stated her desire to initiate treatment.  No new prescription needed.  -Urinalysis,  Complete -Urine culture -Nitrofurantoin 100 mg twice daily x5 days -Start prescribed topical vaginal estrogen cream  Debroah Loop, PA-C  [facility] [address] [place], [place] [address] [phone]

## 2019-06-15 LAB — CULTURE, URINE COMPREHENSIVE

## 2019-06-21 DIAGNOSIS — R040 Epistaxis: Secondary | ICD-10-CM | POA: Diagnosis not present

## 2019-06-21 DIAGNOSIS — J340 Abscess, furuncle and carbuncle of nose: Secondary | ICD-10-CM | POA: Diagnosis not present

## 2019-07-15 ENCOUNTER — Other Ambulatory Visit: Payer: Self-pay | Admitting: Family Medicine

## 2019-07-15 DIAGNOSIS — Z1231 Encounter for screening mammogram for malignant neoplasm of breast: Secondary | ICD-10-CM

## 2019-07-22 ENCOUNTER — Other Ambulatory Visit: Payer: Self-pay | Admitting: Family Medicine

## 2019-07-27 ENCOUNTER — Other Ambulatory Visit: Payer: Self-pay | Admitting: Family Medicine

## 2019-07-28 ENCOUNTER — Ambulatory Visit (INDEPENDENT_AMBULATORY_CARE_PROVIDER_SITE_OTHER): Payer: Medicare Other

## 2019-07-28 DIAGNOSIS — Z23 Encounter for immunization: Secondary | ICD-10-CM

## 2019-08-17 DIAGNOSIS — L720 Epidermal cyst: Secondary | ICD-10-CM | POA: Diagnosis not present

## 2019-08-21 ENCOUNTER — Other Ambulatory Visit: Payer: Self-pay | Admitting: Family Medicine

## 2019-08-21 DIAGNOSIS — D751 Secondary polycythemia: Secondary | ICD-10-CM

## 2019-08-21 DIAGNOSIS — E1122 Type 2 diabetes mellitus with diabetic chronic kidney disease: Secondary | ICD-10-CM

## 2019-08-21 DIAGNOSIS — N183 Chronic kidney disease, stage 3 unspecified: Secondary | ICD-10-CM

## 2019-08-21 DIAGNOSIS — M8580 Other specified disorders of bone density and structure, unspecified site: Secondary | ICD-10-CM

## 2019-08-21 DIAGNOSIS — E782 Mixed hyperlipidemia: Secondary | ICD-10-CM

## 2019-08-21 DIAGNOSIS — E1121 Type 2 diabetes mellitus with diabetic nephropathy: Secondary | ICD-10-CM

## 2019-08-21 DIAGNOSIS — M859 Disorder of bone density and structure, unspecified: Secondary | ICD-10-CM

## 2019-08-22 ENCOUNTER — Ambulatory Visit: Payer: Medicare Other

## 2019-08-23 ENCOUNTER — Other Ambulatory Visit (INDEPENDENT_AMBULATORY_CARE_PROVIDER_SITE_OTHER): Payer: Medicare Other

## 2019-08-23 ENCOUNTER — Ambulatory Visit (INDEPENDENT_AMBULATORY_CARE_PROVIDER_SITE_OTHER): Payer: Medicare Other

## 2019-08-23 DIAGNOSIS — E1122 Type 2 diabetes mellitus with diabetic chronic kidney disease: Secondary | ICD-10-CM

## 2019-08-23 DIAGNOSIS — E1121 Type 2 diabetes mellitus with diabetic nephropathy: Secondary | ICD-10-CM

## 2019-08-23 DIAGNOSIS — D751 Secondary polycythemia: Secondary | ICD-10-CM

## 2019-08-23 DIAGNOSIS — N183 Chronic kidney disease, stage 3 unspecified: Secondary | ICD-10-CM

## 2019-08-23 DIAGNOSIS — Z0000 Encounter for general adult medical examination without abnormal findings: Secondary | ICD-10-CM | POA: Diagnosis not present

## 2019-08-23 DIAGNOSIS — E782 Mixed hyperlipidemia: Secondary | ICD-10-CM | POA: Diagnosis not present

## 2019-08-23 DIAGNOSIS — M859 Disorder of bone density and structure, unspecified: Secondary | ICD-10-CM | POA: Diagnosis not present

## 2019-08-23 DIAGNOSIS — M8580 Other specified disorders of bone density and structure, unspecified site: Secondary | ICD-10-CM

## 2019-08-23 LAB — COMPREHENSIVE METABOLIC PANEL
ALT: 24 U/L (ref 0–35)
AST: 17 U/L (ref 0–37)
Albumin: 4.8 g/dL (ref 3.5–5.2)
Alkaline Phosphatase: 51 U/L (ref 39–117)
BUN: 25 mg/dL — ABNORMAL HIGH (ref 6–23)
CO2: 28 mEq/L (ref 19–32)
Calcium: 10.4 mg/dL (ref 8.4–10.5)
Chloride: 103 mEq/L (ref 96–112)
Creatinine, Ser: 1.03 mg/dL (ref 0.40–1.20)
GFR: 52.23 mL/min — ABNORMAL LOW (ref >60.00)
Glucose, Bld: 124 mg/dL — ABNORMAL HIGH (ref 70–99)
Potassium: 4.2 mEq/L (ref 3.5–5.1)
Sodium: 140 mEq/L (ref 135–145)
Total Bilirubin: 0.4 mg/dL (ref 0.2–1.2)
Total Protein: 7.4 g/dL (ref 6.0–8.3)

## 2019-08-23 LAB — VITAMIN D 25 HYDROXY (VIT D DEFICIENCY, FRACTURES): VITD: 42 ng/mL (ref 30.00–100.00)

## 2019-08-23 LAB — CBC WITH DIFFERENTIAL/PLATELET
Basophils Absolute: 0 10*3/uL (ref 0.0–0.1)
Basophils Relative: 0.8 % (ref 0.0–3.0)
Eosinophils Absolute: 0 10*3/uL (ref 0.0–0.7)
Eosinophils Relative: 0 % (ref 0.0–5.0)
HCT: 44.2 % (ref 36.0–46.0)
Hemoglobin: 14.3 g/dL (ref 12.0–15.0)
Lymphocytes Relative: 24.8 % (ref 12.0–46.0)
Lymphs Abs: 1.3 10*3/uL (ref 0.7–4.0)
MCHC: 32.5 g/dL (ref 30.0–36.0)
MCV: 83.4 fl (ref 78.0–100.0)
Monocytes Absolute: 0.5 10*3/uL (ref 0.1–1.0)
Monocytes Relative: 9.5 % (ref 3.0–12.0)
Neutro Abs: 3.4 10*3/uL (ref 1.4–7.7)
Neutrophils Relative %: 64.9 % (ref 43.0–77.0)
Platelets: 237 10*3/uL (ref 150.0–400.0)
RBC: 5.29 Mil/uL — ABNORMAL HIGH (ref 3.87–5.11)
RDW: 14.9 % (ref 11.5–15.5)
WBC: 5.3 10*3/uL (ref 4.0–10.5)

## 2019-08-23 LAB — LIPID PANEL
Cholesterol: 155 mg/dL (ref 0–200)
HDL: 46.3 mg/dL (ref >39.00)
NonHDL: 108.22
Total CHOL/HDL Ratio: 3
Triglycerides: 226 mg/dL — ABNORMAL HIGH (ref 0.0–149.0)
VLDL: 45.2 mg/dL — ABNORMAL HIGH (ref 0.0–40.0)

## 2019-08-23 LAB — HEMOGLOBIN A1C: Hgb A1c MFr Bld: 6.3 % (ref 4.6–6.5)

## 2019-08-23 LAB — LDL CHOLESTEROL, DIRECT: Direct LDL: 79 mg/dL

## 2019-08-23 NOTE — Patient Instructions (Signed)
Katherine Oliver , Thank you for taking time to come for your Medicare Wellness Visit. I appreciate your ongoing commitment to your health goals. Please review the following plan we discussed and let me know if I can assist you in the future.   Screening recommendations/referrals: Colonoscopy: up to date, completed 02/05/2018 Mammogram: scheduled 08/31/2019 Bone Density: up to date, completed 06/06/2013 Recommended yearly ophthalmology/optometry visit for glaucoma screening and checkup Recommended yearly dental visit for hygiene and checkup  Vaccinations: Influenza vaccine: up to date, completed 07/28/2019 Pneumococcal vaccine: Completed series Tdap vaccine: up to date, completed 08/10/2017 Shingles vaccine: declined    Advanced directives: Advance directive discussed with you today. Even though you declined this today please call our office should you change your mind and we can give you the proper paperwork for you to fill out.  Conditions/risks identified: diabetes, hypertension, hyperlipidemia  Next appointment: 08/26/2019 @ 11:30 am    Preventive Care 65 Years and Older, Female Preventive care refers to lifestyle choices and visits with your health care provider that can promote health and wellness. What does preventive care include?  A yearly physical exam. This is also called an annual well check.  Dental exams once or twice a year.  Routine eye exams. Ask your health care provider how often you should have your eyes checked.  Personal lifestyle choices, including:  Daily care of your teeth and gums.  Regular physical activity.  Eating a healthy diet.  Avoiding tobacco and drug use.  Limiting alcohol use.  Practicing safe sex.  Taking low-dose aspirin every day.  Taking vitamin and mineral supplements as recommended by your health care provider. What happens during an annual well check? The services and screenings done by your health care provider during your annual  well check will depend on your age, overall health, lifestyle risk factors, and family history of disease. Counseling  Your health care provider may ask you questions about your:  Alcohol use.  Tobacco use.  Drug use.  Emotional well-being.  Home and relationship well-being.  Sexual activity.  Eating habits.  History of falls.  Memory and ability to understand (cognition).  Work and work Statistician.  Reproductive health. Screening  You may have the following tests or measurements:  Height, weight, and BMI.  Blood pressure.  Lipid and cholesterol levels. These may be checked every 5 years, or more frequently if you are over 70 years old.  Skin check.  Lung cancer screening. You may have this screening every year starting at age 33 if you have a 30-pack-year history of smoking and currently smoke or have quit within the past 15 years.  Fecal occult blood test (FOBT) of the stool. You may have this test every year starting at age 43.  Flexible sigmoidoscopy or colonoscopy. You may have a sigmoidoscopy every 5 years or a colonoscopy every 10 years starting at age 14.  Hepatitis C blood test.  Hepatitis B blood test.  Sexually transmitted disease (STD) testing.  Diabetes screening. This is done by checking your blood sugar (glucose) after you have not eaten for a while (fasting). You may have this done every 1-3 years.  Bone density scan. This is done to screen for osteoporosis. You may have this done starting at age 8.  Mammogram. This may be done every 1-2 years. Talk to your health care provider about how often you should have regular mammograms. Talk with your health care provider about your test results, treatment options, and if necessary, the need for  more tests. Vaccines  Your health care provider may recommend certain vaccines, such as:  Influenza vaccine. This is recommended every year.  Tetanus, diphtheria, and acellular pertussis (Tdap, Td) vaccine.  You may need a Td booster every 10 years.  Zoster vaccine. You may need this after age 39.  Pneumococcal 13-valent conjugate (PCV13) vaccine. One dose is recommended after age 35.  Pneumococcal polysaccharide (PPSV23) vaccine. One dose is recommended after age 74. Talk to your health care provider about which screenings and vaccines you need and how often you need them. This information is not intended to replace advice given to you by your health care provider. Make sure you discuss any questions you have with your health care provider. Document Released: 11/16/2015 Document Revised: 07/09/2016 Document Reviewed: 08/21/2015 Elsevier Interactive Patient Education  2017 [place] Prevention in the Home Falls can cause injuries. They can happen to people of all ages. There are many things you can do to make your home safe and to help prevent falls. What can I do on the outside of my home?  Regularly fix the edges of walkways and driveways and fix any cracks.  Remove anything that might make you trip as you walk through a door, such as a raised step or threshold.  Trim any bushes or trees on the path to your home.  Use bright outdoor lighting.  Clear any walking paths of anything that might make someone trip, such as rocks or tools.  Regularly check to see if handrails are loose or broken. Make sure that both sides of any steps have handrails.  Any raised decks and porches should have guardrails on the edges.  Have any leaves, snow, or ice cleared regularly.  Use sand or salt on walking paths during winter.  Clean up any spills in your garage right away. This includes oil or grease spills. What can I do in the bathroom?  Use night lights.  Install grab bars by the toilet and in the tub and shower. Do not use towel bars as grab bars.  Use non-skid mats or decals in the tub or shower.  If you need to sit down in the shower, use a plastic, non-slip stool.  Keep the  floor dry. Clean up any water that spills on the floor as soon as it happens.  Remove soap buildup in the tub or shower regularly.  Attach bath mats securely with double-sided non-slip rug tape.  Do not have throw rugs and other things on the floor that can make you trip. What can I do in the bedroom?  Use night lights.  Make sure that you have a light by your bed that is easy to reach.  Do not use any sheets or blankets that are too big for your bed. They should not hang down onto the floor.  Have a firm chair that has side arms. You can use this for support while you get dressed.  Do not have throw rugs and other things on the floor that can make you trip. What can I do in the kitchen?  Clean up any spills right away.  Avoid walking on wet floors.  Keep items that you use a lot in easy-to-reach places.  If you need to reach something above you, use a strong step stool that has a grab bar.  Keep electrical cords out of the way.  Do not use floor polish or wax that makes floors slippery. If you must use wax, use non-skid  floor wax.  Do not have throw rugs and other things on the floor that can make you trip. What can I do with my stairs?  Do not leave any items on the stairs.  Make sure that there are handrails on both sides of the stairs and use them. Fix handrails that are broken or loose. Make sure that handrails are as long as the stairways.  Check any carpeting to make sure that it is firmly attached to the stairs. Fix any carpet that is loose or worn.  Avoid having throw rugs at the top or bottom of the stairs. If you do have throw rugs, attach them to the floor with carpet tape.  Make sure that you have a light switch at the top of the stairs and the bottom of the stairs. If you do not have them, ask someone to add them for you. What else can I do to help prevent falls?  Wear shoes that:  Do not have high heels.  Have rubber bottoms.  Are comfortable and fit  you well.  Are closed at the toe. Do not wear sandals.  If you use a stepladder:  Make sure that it is fully opened. Do not climb a closed stepladder.  Make sure that both sides of the stepladder are locked into place.  Ask someone to hold it for you, if possible.  Clearly mark and make sure that you can see:  Any grab bars or handrails.  First and last steps.  Where the edge of each step is.  Use tools that help you move around (mobility aids) if they are needed. These include:  Canes.  Walkers.  Scooters.  Crutches.  Turn on the lights when you go into a dark area. Replace any light bulbs as soon as they burn out.  Set up your furniture so you have a clear path. Avoid moving your furniture around.  If any of your floors are uneven, fix them.  If there are any pets around you, be aware of where they are.  Review your medicines with your doctor. Some medicines can make you feel dizzy. This can increase your chance of falling. Ask your doctor what other things that you can do to help prevent falls. This information is not intended to replace advice given to you by your health care provider. Make sure you discuss any questions you have with your health care provider. Document Released: 08/16/2009 Document Revised: 03/27/2016 Document Reviewed: 11/24/2014 Elsevier Interactive Patient Education  2017 [employer].

## 2019-08-23 NOTE — Progress Notes (Signed)
PCP notes:  Health Maintenance: declined shingrix, eye exam scheduled for next week per patient    Abnormal Screenings: none    Patient concerns: Wants to discuss a weight loss drug she saw on TV recently.     Nurse concerns: none    Next PCP appt.: 08/26/2019 @ 11:30 am

## 2019-08-23 NOTE — Progress Notes (Signed)
Subjective:   Katherine Oliver is a 73 y.o. female who presents for Medicare Annual (Subsequent) preventive examination.  Review of Systems:    This visit is being conducted through telemedicine via telephone at the nurse health advisor's home address due to the COVID-19 pandemic. This patient has given me verbal consent via doximity to conduct this visit, patient states they are participating from their home address. Patient and myself are on the telephone call. There is no referral for this visit. Some vital signs may be absent or patient reported.    Patient identification: identified by name, DOB, and current address  Cardiac Risk Factors include: diabetes mellitus;dyslipidemia;hypertension;advanced age (>79men, >91 women)     Objective:     Vitals: There were no vitals taken for this visit.  There is no height or weight on file to calculate BMI.  Advanced Directives 08/23/2019 01/17/2019 01/17/2019 01/10/2019 09/23/2018 08/16/2018 08/10/2017  Does Patient Have a Medical Advance Directive? No Yes Yes Yes No No Yes  Type of Advance Directive - [place];Living will [place];Living will [place];Living will - - [name];Living will  Does patient want to make changes to medical advance directive? - No - Patient declined No - Patient declined No - Patient declined - - -  Copy of [place] in Chart? - No - copy requested No - copy requested - - - No - copy requested  Would patient like information on creating a medical advance directive? No - Patient declined - - - No - Patient declined No - Patient declined -    Tobacco Social History   Tobacco Use  Smoking Status Never Smoker  Smokeless Tobacco Never Used     Counseling given: Not Answered   Clinical Intake:  Pre-visit preparation completed: Yes  Pain : No/denies pain     Nutritional Risks: None Diabetes: Yes CBG  done?: No Did pt. bring in CBG monitor from home?: No  How often do you need to have someone help you when you read instructions, pamphlets, or other written materials from your doctor or pharmacy?: 1 - Never What is the last grade level you completed in school?: masters degree  Interpreter Needed?: No  Information entered by :: CJohnson, LPN  Past Medical History:  Diagnosis Date  . Allergic rhinitis   . Arthritis   . 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 nephropathy ([place])    DSME [facility] 01/2016   . GERD (gastroesophageal reflux disease)   . Hepatic steatosis    by abd [place] 05/2012, mild transaminitis - normal iron sat and viral hep panel (2011), stable [place] 2017  . History of chicken pox   . History of measles   . History of recurrent UTIs    on chronic keflex  . HLD (hyperlipidemia)   . HTN (hypertension)   . 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 Kidney cyst, acquired 01/2016   L kidney by [place]  . Kidney stone 01/2016   L kidney by [place]  . Lung nodules 11/2013   overall stable on f/u CT 01/2016  . Osteopenia 06/2013   mild, forearm T -1.1, hip and spine WNL  . Polycythemia    mild, stable (2013)  . Primary localized osteoarthritis of right knee  01/05/2019  . Rosacea    metrogel   Past Surgical History:  Procedure Laterality Date  . APPENDECTOMY  1987  . BREAST BIOPSY Right 1963   benign  . BREAST BIOPSY Right 08/2014   benign- core  . cardiolite stress test  04/2004   normal  . CESAREAN SECTION  [id number]   x2  . CHOLECYSTECTOMY  2003  . COLONOSCOPY  09/26/2008   adenomatous polyp, rpt 3-5 yrs  . COLONOSCOPY  08/2012   adenomatous polyps, diverticulosis, rec rpt 5 yrs [name])  . COLONOSCOPY WITH PROPOFOL N/A 02/05/2018   4TA, SSA, diverticulosis, rpt 3 yrs [name], [name], MD)  . dexa  2003   normal  .  dexa  06/2013   [facility] - Tscore -1.1 forearm, normal spine and femur  . 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 TRANSTHORACIC ECHOCARDIOGRAM  04/2019   EF 55-60%, modLVH, impaired relaxation   . TRIGGER FINGER RELEASE  2007;2010;2011   bilateral  . TRIGGER FINGER RELEASE  02/2017  . VAGINAL HYSTERECTOMY  1984   for menorrhagia, ovaries in place   Family History  Problem Relation Age of Onset  . Stroke Mother        several  . Hyperlipidemia Mother   . Hypertension Mother   . Cancer Father        colon  . Hypertension Father   . Hyperlipidemia Father   . Coronary artery disease Father 57       MIx1, CABG  . Cancer Paternal Aunt        abdominal  . Coronary artery disease Maternal Grandmother   . Diabetes Maternal Grandfather   . Coronary artery disease Maternal Grandfather   . Breast cancer Neg Hx    Social History   Socioeconomic History  . Marital status: Married    Spouse name: Not on file  . Number of children: Not on file  . Years of education: Not on file  . Highest education level: Not on file  Occupational History  . Not on file  Social Needs  . Financial resource strain: Not hard at all  . Food insecurity    Worry: Never true    Inability: Never true  . Transportation needs    Medical: No    Non-medical: No  Tobacco Use  . Smoking status: Never Smoker  . Smokeless tobacco: Never Used  Substance and Sexual Activity  . Alcohol use: Not Currently    Alcohol/week: 1.0 - 2.0 standard drinks    Types: 1 - 2 Glasses of wine per week    Comment: Occasional/1 mixed drink per month  . Drug use: No  . Sexual activity: Yes  Lifestyle  . Physical activity    Days per week: 0 days    Minutes per session: 0 min  . Stress: Not at all  Relationships  . Social Herbalist on phone: Not on file    Gets together: Not on file     Attends religious service: Not on file    Active member of club or organization: Not on file    Attends meetings of clubs or organizations: Not on file    Relationship status: Not on file  Other Topics Concern  . 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 Daily walking about 20 min.    Diet: ok water intake 4 glasses/day, daily fruits/vegetables, red meat 4x/wk, fish 3-4x/wk    Outpatient Encounter Medications as of 08/23/2019  Medication Sig  . ACCU-CHEK AVIVA PLUS test strip USE AS DIRECTED TO CHECK BLOOD SUGARS ONCE DAILY E11.21  . acetaminophen (TYLENOL) 500 MG tablet Take 1,000 mg by mouth every 6 (six) hours as needed for moderate pain.   Marland Kitchen amLODipine (NORVASC) 10 MG tablet TAKE 1 TABLET BY MOUTH  DAILY  . benazepril (LOTENSIN) 20 MG tablet TAKE 1 TABLET BY MOUTH  DAILY  . Ca Phosphate-Cholecalciferol (CALCIUM/VITAMIN D3 GUMMIES PO) Take by mouth daily.  . Cranberry 500 MG CAPS Take 1 capsule by mouth 3 (three) times daily.  . diphenhydrAMINE (BENADRYL) 25 MG tablet Take 25 mg by mouth 2 (two) times daily as needed for allergies.   . fluticasone (FLONASE) 50 MCG/ACT nasal spray USE 2 SPRAYS INTO BOTH  NOSTRILS DAILY AS NEEDED  . hydrochlorothiazide (MICROZIDE) 12.5 MG capsule TAKE 1 CAPSULE BY MOUTH  DAILY  . Krill Oil Omega-3 500 MG CAPS Take 500 mg by mouth daily.   . Lancets (ACCU-CHEK SOFT TOUCH) lancets Use as instructed to check sugars once daily E11.21  . metFORMIN (GLUCOPHAGE-XR) 500 MG 24 hr tablet TAKE 1 TABLET BY MOUTH  DAILY WITH BREAKFAST  . Multiple Vitamins-Minerals (CENTRUM SILVER PO) Take 1 tablet by mouth daily.  Marland Kitchen oxymetazoline (AFRIN) 0.05 % nasal spray Place 2 sprays into both nostrils 2 (two) times daily.  . rosuvastatin (CRESTOR) 10 MG tablet  TAKE 1 TABLET BY MOUTH AT  BEDTIME  . aspirin 81 MG tablet Take 81 mg by mouth daily.   No facility-administered encounter medications on file as of 08/23/2019.     Activities of Daily Living In your present state of health, do you have any difficulty performing the following activities: 08/23/2019 01/17/2019  Hearing? N N  Vision? N N  Difficulty concentrating or making decisions? N N  Walking or climbing stairs? N Y  Dressing or bathing? N N  Doing errands, shopping? N N  Preparing Food and eating ? N -  Using the Toilet? N -  In the past six months, have you accidently leaked urine? N -  Do you have problems with loss of bowel control? N -  Managing your Medications? N -  Managing your Finances? N -  Housekeeping or managing your Housekeeping? N -  Some recent data might be hidden    Patient Care Team: [name], MD as PCP - General (Family Medicine) [name], MD as Consulting Physician (Orthopedic Surgery) Ralene Bathe, MD as Consulting Physician (Dermatology) Johnnette Litter, MD as Consulting Physician (Dentistry) Thelma Comp, OD as Consulting Physician (Optometry)    Assessment:   This is a routine wellness examination for Katherine Oliver.  Exercise Activities and Dietary recommendations Current Exercise Habits: The patient does not participate in regular exercise at present, Exercise limited by: orthopedic condition(s)(total knee replacement)  Goals    . Increase physical activity     Starting 08/16/2018, I will continue to exercise for 60 minutes 3 days per week.     . Patient Stated     08/23/2019, I will try to lose about 10-30 lbs in the future.        Fall Risk Fall Risk  08/23/2019 09/21/2018  08/16/2018 08/10/2017 07/14/2016  Falls in the past year? 0 0 No No No  Comment - Emmi Telephone Survey: data to providers prior to load - - -  Number falls in past yr: 0 - - - -  Injury with Fall? 0 - - - -  Risk for fall due to : Medication side  effect - - - -  Follow up Falls evaluation completed;Falls prevention discussed - - - -   Is the patient's home free of loose throw rugs in walkways, pet beds, electrical cords, etc?   yes      Grab bars in the bathroom? no      Handrails on the stairs?   yes      Adequate lighting?   yes  Timed Get Up and Go performed: N/A  Depression Screen PHQ 2/9 Scores 08/23/2019 08/16/2018 08/10/2017 07/14/2016  PHQ - 2 Score 0 0 0 0  PHQ- 9 Score 0 0 0 -     Cognitive Function MMSE - Mini Mental State Exam 08/23/2019 08/16/2018 08/10/2017 07/14/2016  Orientation to time 5 5 5 5   Orientation to Place 5 5 5 5   Registration 3 3 3 3   Attention/ Calculation 5 0 0 0  Recall 3 3 3 3   Language- name 2 objects - 0 0 0  Language- repeat 1 1 1 1   Language- follow 3 step command - 3 3 3   Language- read & follow direction - 0 0 0  Write a sentence - 0 0 0  Copy design - 0 0 0  Total score - 20 20 20   Mini Cog  Mini-Cog screen was completed. Maximum score is 22. A value of 0 denotes this part of the MMSE was not completed or the patient failed this part of the Mini-Cog screening.      Immunization History  Administered Date(s) Administered  . Fluad Quad(high Dose 65+) 07/28/2019  . Influenza, High Dose Seasonal PF 08/10/2017, 08/16/2018  . Influenza,inj,Quad PF,6+ Mos 08/02/2014, 07/05/2015, 07/14/2016  . Influenza-Unspecified 08/03/2013  . Pneumococcal Conjugate-13 06/30/2014  . Pneumococcal Polysaccharide-23 11/03/2002, 09/20/2007, 05/31/2012  . Td 01/20/2005, 08/10/2017  . Zoster 11/03/2008    Qualifies for Shingles Vaccine? Yes  Screening Tests Health Maintenance  Topic Date Due  . FOOT EXAM  05/20/2019  . OPHTHALMOLOGY EXAM  07/09/2019  . HEMOGLOBIN A1C  07/13/2019  . DTaP/Tdap/Td (1 - Tdap) 08/11/2027 (Originally 08/11/1965)  . MAMMOGRAM  08/27/2019  . COLONOSCOPY  02/06/2023  . TETANUS/TDAP  08/11/2027  . INFLUENZA VACCINE  Completed  . DEXA SCAN  Completed  . Hepatitis C  Screening  Completed  . PNA vac Low Risk Adult  Completed    Cancer Screenings: Lung: Low Dose CT Chest recommended if Age 27-80 years, 30 pack-year currently smoking OR have quit w/in 15years. Patient does not qualify. Breast:  Up to date on Mammogram? Yes, scheduled 08/31/2019   Up to date of Bone Density/Dexa? Yes, completed 06/06/2013 Colorectal: completed 02/05/2018  Additional Screenings:  Hepatitis C Screening: 07/31/2016     Plan:    Patient will try to lose 10-30 lbs in the future.   I have personally reviewed and noted the following in the patient's chart:   . Medical and social history . Use of alcohol, tobacco or illicit drugs  . Current medications and supplements . Functional ability and status . Nutritional status . Physical activity . Advanced directives . List of other physicians . Hospitalizations, surgeries, and ER visits in previous 12 months .  Vitals . Screenings to include cognitive, depression, and falls . Referrals and appointments  In addition, I have reviewed and discussed with patient certain preventive protocols, quality metrics, and best practice recommendations. A written personalized care plan for preventive services as well as general preventive health recommendations were provided to patient.     Andrez Grime, LPN  [id number]

## 2019-08-25 ENCOUNTER — Other Ambulatory Visit: Payer: Self-pay

## 2019-08-25 ENCOUNTER — Ambulatory Visit (INDEPENDENT_AMBULATORY_CARE_PROVIDER_SITE_OTHER): Payer: Medicare Other | Admitting: Urology

## 2019-08-25 ENCOUNTER — Encounter: Payer: Self-pay | Admitting: Urology

## 2019-08-25 VITALS — BP 120/72 | HR 90 | Ht 61.0 in | Wt 171.0 lb

## 2019-08-25 DIAGNOSIS — N390 Urinary tract infection, site not specified: Secondary | ICD-10-CM | POA: Diagnosis not present

## 2019-08-25 LAB — URINALYSIS, COMPLETE
Bilirubin, UA: NEGATIVE
Glucose, UA: NEGATIVE
Ketones, UA: NEGATIVE
Leukocytes,UA: NEGATIVE
Nitrite, UA: NEGATIVE
Protein,UA: NEGATIVE
RBC, UA: NEGATIVE
Specific Gravity, UA: 1.015 (ref 1.005–1.030)
Urobilinogen, Ur: 0.2 mg/dL (ref 0.2–1.0)
pH, UA: 7 (ref 5.0–7.5)

## 2019-08-25 LAB — MICROSCOPIC EXAMINATION
Bacteria, UA: NONE SEEN
RBC, Urine: NONE SEEN /hpf (ref 0–2)

## 2019-08-25 LAB — BLADDER SCAN AMB NON-IMAGING

## 2019-08-25 MED ORDER — PREMARIN 0.625 MG/GM VA CREA: 1.0000 | TOPICAL_CREAM | VAGINAL | 6 refills | Status: DC

## 2019-08-25 NOTE — Progress Notes (Signed)
   08/25/2019 2:14 PM   Gilmore Laroche Apps 08-21-1946 [id number]  Reason for visit: Follow up recurrent UTI  HPI: I saw Ms. Gravette today in clinic for follow-up of recurrent UTIs.  Briefly, I previously saw her in June 2020 and recommended topical estrogen cream and cranberry prophylaxis.  She did not start the estrogen cream, and had a recurrent Enterococcus UTI on 06/13/2019 treated with culture appropriate nitrofurantoin.  After that UTI, she started the estrogen cream and cranberry prophylaxis 3 times daily and reports she has been doing very well.  She denies any gross hematuria, flank pain, or UTI symptoms since starting these medications.  Urinalysis is benign today with 0-5 WBCs, 0 RBCs, no bacteria, nitrite negative.  PVR 23 mL.  We discussed the evaluation and treatment of patients with recurrent UTIs at length.  We specifically discussed the differences between asymptomatic bacteriuria and true urinary tract infection.  We discussed the AUA definition of recurrent UTI of at least 2 culture proven symptomatic acute cystitis episodes in a 78-month period, or 3 within a 1 year period.  We discussed the importance of culture directed antibiotic treatment, and antibiotic stewardship.  First-line therapy includes nitrofurantoin(5 days), Bactrim(3 days), or fosfomycin(3 g single dose).  Possible etiologies of recurrent infection include periurethral tissue atrophy in postmenopausal woman, constipation, sexual activity, incomplete emptying, anatomic abnormalities, and even genetic predisposition.  Finally, we discussed the role of perineal hygiene, timed voiding, adequate hydration, topical vaginal estrogen, cranberry prophylaxis, and low-dose antibiotic prophylaxis.  Consider Macrobid daily prophylaxis in the future if ongoing infections despite Premarin cream and cranberry  A total of 15 minutes were spent face-to-face with the patient, greater than 50% was spent in patient education,  counseling, and coordination of care regarding recurrent UTIs.  Billey Co, [facility] [address], [place], [place] [address] [phone]

## 2019-08-26 ENCOUNTER — Encounter: Payer: Self-pay | Admitting: Family Medicine

## 2019-08-26 ENCOUNTER — Ambulatory Visit (INDEPENDENT_AMBULATORY_CARE_PROVIDER_SITE_OTHER): Payer: Medicare Other | Admitting: Family Medicine

## 2019-08-26 VITALS — BP 124/78 | HR 96 | Temp 97.7°F | Ht 60.75 in | Wt 173.1 lb

## 2019-08-26 DIAGNOSIS — E1122 Type 2 diabetes mellitus with diabetic chronic kidney disease: Secondary | ICD-10-CM | POA: Diagnosis not present

## 2019-08-26 DIAGNOSIS — M8588 Other specified disorders of bone density and structure, other site: Secondary | ICD-10-CM

## 2019-08-26 DIAGNOSIS — K760 Fatty (change of) liver, not elsewhere classified: Secondary | ICD-10-CM

## 2019-08-26 DIAGNOSIS — Z7189 Other specified counseling: Secondary | ICD-10-CM

## 2019-08-26 DIAGNOSIS — E669 Obesity, unspecified: Secondary | ICD-10-CM | POA: Diagnosis not present

## 2019-08-26 DIAGNOSIS — I10 Essential (primary) hypertension: Secondary | ICD-10-CM

## 2019-08-26 DIAGNOSIS — Z87440 Personal history of urinary (tract) infections: Secondary | ICD-10-CM | POA: Diagnosis not present

## 2019-08-26 DIAGNOSIS — N183 Chronic kidney disease, stage 3 unspecified: Secondary | ICD-10-CM

## 2019-08-26 DIAGNOSIS — R040 Epistaxis: Secondary | ICD-10-CM

## 2019-08-26 DIAGNOSIS — E1169 Type 2 diabetes mellitus with other specified complication: Secondary | ICD-10-CM

## 2019-08-26 DIAGNOSIS — E785 Hyperlipidemia, unspecified: Secondary | ICD-10-CM

## 2019-08-26 DIAGNOSIS — M8580 Other specified disorders of bone density and structure, unspecified site: Secondary | ICD-10-CM | POA: Diagnosis not present

## 2019-08-26 DIAGNOSIS — E1136 Type 2 diabetes mellitus with diabetic cataract: Secondary | ICD-10-CM

## 2019-08-26 MED ORDER — VITAMIN D3 25 MCG (1000 UT) PO CAPS: 1.0000 | ORAL_CAPSULE | Freq: Every day | ORAL | Status: DC

## 2019-08-26 MED ORDER — ROSUVASTATIN CALCIUM 10 MG PO TABS: 10.0000 mg | ORAL_TABLET | Freq: Every day | ORAL | 3 refills | Status: DC

## 2019-08-26 MED ORDER — FENOFIBRATE 160 MG PO TABS: 160.0000 mg | ORAL_TABLET | Freq: Every day | ORAL | 3 refills | Status: DC

## 2019-08-26 MED ORDER — BENAZEPRIL HCL 20 MG PO TABS: 20.0000 mg | ORAL_TABLET | Freq: Every day | ORAL | 3 refills | Status: DC

## 2019-08-26 MED ORDER — ACCU-CHEK SOFT TOUCH LANCETS MISC: 3 refills | Status: DC

## 2019-08-26 MED ORDER — AMLODIPINE BESYLATE 10 MG PO TABS: 10.0000 mg | ORAL_TABLET | Freq: Every day | ORAL | 3 refills | Status: DC

## 2019-08-26 MED ORDER — HYDROCHLOROTHIAZIDE 12.5 MG PO CAPS: 12.5000 mg | ORAL_CAPSULE | Freq: Every day | ORAL | 3 refills | Status: DC

## 2019-08-26 MED ORDER — FLUTICASONE PROPIONATE 50 MCG/ACT NA SUSP: NASAL | 3 refills | Status: DC

## 2019-08-26 MED ORDER — ACCU-CHEK AVIVA PLUS VI STRP: ORAL_STRIP | 7 refills | Status: DC

## 2019-08-26 MED ORDER — METFORMIN HCL ER 500 MG PO TB24: 500.0000 mg | ORAL_TABLET | Freq: Every day | ORAL | 3 refills | Status: DC

## 2019-08-26 NOTE — Assessment & Plan Note (Signed)
Appreciate uro care. Doing well with TID cranberry tablets. About to start premarin.

## 2019-08-26 NOTE — Assessment & Plan Note (Signed)
Advanced directives: husband [name] is [place]. DPOA in chart (05/2014). Planning to update.

## 2019-08-26 NOTE — Assessment & Plan Note (Addendum)
rec continue vit D 1000 IU daily. Encouraged restarting walking routine. Will update DEXA scan

## 2019-08-26 NOTE — Assessment & Plan Note (Signed)
Chronic, stable. Continue to monitor. Encouraged good hydration status.

## 2019-08-26 NOTE — Assessment & Plan Note (Signed)
Chronic, continue statin, fibrate and krill oil. The 10-year ASCVD risk score Mikey Bussing DC Brooke Bonito., et al., 2013) is: 34.2%   Values used to calculate the score:     Age: 73 years     Sex: Female     Is Non-Hispanic African American: No     Diabetic: Yes     Tobacco smoker: No     Systolic Blood Pressure: 006 mmHg     Is BP treated: Yes     HDL Cholesterol: 46.3 mg/dL     Total Cholesterol: 155 mg/dL

## 2019-08-26 NOTE — Assessment & Plan Note (Signed)
Encouraged ongoing healthy diet and lifestyle changes to affect sustainable weight loss. Specifically encouraged renewed efforts at regular walking routine.

## 2019-08-26 NOTE — Assessment & Plan Note (Signed)
Chronic, wonderful control on low dose metformin - continue.

## 2019-08-26 NOTE — Patient Instructions (Addendum)
Check with pharmacy about metformin XR recall.  If interested, check with pharmacy about new 2 shot shingles series (shingrix).  We will update DEXA scan.  Continue vit D 1000 units daily.  You are doing well today.  Try to increase walking routine.   Health Maintenance After Age 75 After age 43, you are at a higher risk for certain long-term diseases and infections as well as injuries from falls. Falls are a major cause of broken bones and head injuries in people who are older than age 33. Getting regular preventive care can help to keep you healthy and well. Preventive care includes getting regular testing and making lifestyle changes as recommended by your health care provider. Talk with your health care provider about:  Which screenings and tests you should have. A screening is a test that checks for a disease when you have no symptoms.  A diet and exercise plan that is right for you. What should I know about screenings and tests to prevent falls? Screening and testing are the best ways to find a health problem early. Early diagnosis and treatment give you the best chance of managing medical conditions that are common after age 1. Certain conditions and lifestyle choices may make you more likely to have a fall. Your health care provider may recommend:  Regular vision checks. Poor vision and conditions such as cataracts can make you more likely to have a fall. If you wear glasses, make sure to get your prescription updated if your vision changes.  Medicine review. Work with your health care provider to regularly review all of the medicines you are taking, including over-the-counter medicines. Ask your health care provider about any side effects that may make you more likely to have a fall. Tell your health care provider if any medicines that you take make you feel dizzy or sleepy.  Osteoporosis screening. Osteoporosis is a condition that causes the bones to get weaker. This can make the bones  weak and cause them to break more easily.  Blood pressure screening. Blood pressure changes and medicines to control blood pressure can make you feel dizzy.  Strength and balance checks. Your health care provider may recommend certain tests to check your strength and balance while standing, walking, or changing positions.  Foot health exam. Foot pain and numbness, as well as not wearing proper footwear, can make you more likely to have a fall.  Depression screening. You may be more likely to have a fall if you have a fear of falling, feel emotionally low, or feel unable to do activities that you used to do.  Alcohol use screening. Using too much alcohol can affect your balance and may make you more likely to have a fall. What actions can I take to lower my risk of falls? General instructions  Talk with your health care provider about your risks for falling. Tell your health care provider if: ? You fall. Be sure to tell your health care provider about all falls, even ones that seem minor. ? You feel dizzy, sleepy, or off-balance.  Take over-the-counter and prescription medicines only as told by your health care provider. These include any supplements.  Eat a healthy diet and maintain a healthy weight. A healthy diet includes low-fat dairy products, low-fat (lean) meats, and fiber from whole grains, beans, and lots of fruits and vegetables. Home safety  Remove any tripping hazards, such as rugs, cords, and clutter.  Install safety equipment such as grab bars in bathrooms and safety  rails on stairs.  Keep rooms and walkways well-lit. Activity   Follow a regular exercise program to stay fit. This will help you maintain your balance. Ask your health care provider what types of exercise are appropriate for you.  If you need a cane or walker, use it as recommended by your health care provider.  Wear supportive shoes that have nonskid soles. Lifestyle  Do not drink alcohol if your  health care provider tells you not to drink.  If you drink alcohol, limit how much you have: ? 0-1 drink a day for women. ? 0-2 drinks a day for men.  Be aware of how much alcohol is in your drink. In the [place] one drink equals one typical bottle of beer (12 oz), one-half glass of wine (5 oz), or one shot of hard liquor (1 oz).  Do not use any products that contain nicotine or tobacco, such as cigarettes and e-cigarettes. If you need help quitting, ask your health care provider. Summary  Having a healthy lifestyle and getting preventive care can help to protect your health and wellness after age 56.  Screening and testing are the best way to find a health problem early and help you avoid having a fall. Early diagnosis and treatment give you the best chance for managing medical conditions that are more common for people who are older than age 46.  Falls are a major cause of broken bones and head injuries in people who are older than age 57. Take precautions to prevent a fall at home.  Work with your health care provider to learn what changes you can make to improve your health and wellness and to prevent falls. This information is not intended to replace advice given to you by your health care provider. Make sure you discuss any questions you have with your health care provider. Document Released: 09/02/2017 Document Revised: 02/10/2019 Document Reviewed: 09/02/2017 Elsevier Patient Education  2020 [employer].

## 2019-08-26 NOTE — Assessment & Plan Note (Signed)
Chronic, stable. Continue current regimen. 

## 2019-08-26 NOTE — Progress Notes (Signed)
This visit was conducted in person.  BP 124/78 (BP Location: Left Arm, Patient Position: Sitting, Cuff Size: Normal)   Pulse 96   Temp 97.7 F (36.5 C) (Temporal)   Ht 5' 0.75" (1.543 m)   Wt 173 lb 2 oz (78.5 kg)   SpO2 95%   BMI 32.98 kg/m    CC: CPE Subjective:    Patient ID: Katherine Oliver, female    DOB: 06/23/1946, 73 y.o.   MRN: [id number]  HPI: Katherine Oliver is a 73 y.o. female presenting on 08/26/2019 for Annual Exam (Prt 2. )   Saw health advisor earlier this week for medicare wellness visit. Note reviewed.   No exam data present    Clinical Support from 08/23/2019 in [place] at [facility] Total Score  0      Fall Risk  08/23/2019 09/21/2018 08/16/2018 08/10/2017 07/14/2016  Falls in the past year? 0 0 No No No  Comment - Emmi Telephone Survey: data to providers prior to load - - -  Number falls in past yr: 0 - - - -  Injury with Fall? 0 - - - -  Risk for fall due to : Medication side effect - - - -  Follow up Falls evaluation completed;Falls prevention discussed - - - -     S/p R knee replacement 01/2019.  Brings fasting sugar log in am - 90-110.  Asks about weight loss supplement "golo" with release formula.  Uro eval yesterday - good report. No recent UTI. Taking cranberry tablet TID as well as small amt premarin cream.   Preventative: COLONOSCOPY WITH PROPOFOL 02/05/2018 - 4TA, SSA, diverticulosis, rpt 3 yrs [name], [name], MD)  Mammogram - 08/2014 abnormal - s/p normal core biopsy and f/u US 02/2015. mammo yearly WNL since Pap smear - stopped around 73yo, s/p hysterectomy for heavy bleeding. Both ovaries remain. Declines continued pelvic exam.  Dexa Date: 06/2013 [facility] - Tscore -1.1 forearm, normal spine and femur - will repeat this year Flu shot yearly Pneumovax 05/2012, prevnar 2015 Td 2006 Zostavax2010 Shingrix - discussed - to consider Advanced directives: husband [name] is [place]. DPOA in chart  (05/2014). Planning to update.  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  Diet: ok water intake 4 glasses/day, daily fruits/vegetables, red meat 4x/wk, fish 3-4x/wk     Relevant past medical, surgical, family and social history reviewed and updated as indicated. Interim medical history since our last visit reviewed. Allergies and medications reviewed and updated. Outpatient Medications Prior to Visit  Medication Sig Dispense Refill  . acetaminophen (TYLENOL) 500 MG tablet Take 1,000 mg by mouth every 6 (six) hours as needed for moderate pain.     . Ca Phosphate-Cholecalciferol (CALCIUM/VITAMIN D3 GUMMIES PO) Take by mouth daily.    Marland Kitchen conjugated estrogens (PREMARIN) vaginal cream Place 1 Applicatorful vaginally every other day. 42.5 g 6  . Cranberry 500 MG CAPS Take 1 capsule by mouth 3 (three) times daily.    . diphenhydrAMINE (BENADRYL) 25 MG tablet Take 25 mg by mouth 2 (two) times daily as  needed for allergies.      Docker Oil Omega-3 500 MG CAPS Take 500 mg by mouth daily.     . Multiple Vitamins-Minerals (CENTRUM SILVER PO) Take 1 tablet by mouth daily.    Marland Kitchen oxymetazoline (AFRIN) 0.05 % nasal spray Place 2 sprays into both nostrils 2 (two) times daily.    Marland Kitchen ACCU-CHEK AVIVA PLUS test strip USE AS DIRECTED TO CHECK BLOOD SUGARS ONCE DAILY E11.21 100 each 7  . amLODipine (NORVASC) 10 MG tablet TAKE 1 TABLET BY MOUTH  DAILY 90 tablet 0  . benazepril (LOTENSIN) 20 MG tablet TAKE 1 TABLET BY MOUTH  DAILY 90 tablet 0  . fenofibrate 160 MG  tablet Take 160 mg by mouth daily.    . fluticasone (FLONASE) 50 MCG/ACT nasal spray USE 2 SPRAYS INTO BOTH  NOSTRILS DAILY AS NEEDED 48 g 3  . hydrochlorothiazide (MICROZIDE) 12.5 MG capsule TAKE 1 CAPSULE BY MOUTH  DAILY 90 capsule 0  . Lancets (ACCU-CHEK SOFT TOUCH) lancets Use as instructed to check sugars once daily E11.21 100 each 3  . metFORMIN (GLUCOPHAGE-XR) 500 MG 24 hr tablet TAKE 1 TABLET BY MOUTH  DAILY WITH BREAKFAST 90 tablet 0  . rosuvastatin (CRESTOR) 10 MG tablet TAKE 1 TABLET BY MOUTH AT  BEDTIME 90 tablet 0   No facility-administered medications prior to visit.      Per HPI unless specifically indicated in ROS section below Review of Systems Objective:    BP 124/78 (BP Location: Left Arm, Patient Position: Sitting, Cuff Size: Normal)   Pulse 96   Temp 97.7 F (36.5 C) (Temporal)   Ht 5' 0.75" (1.543 m)   Wt 173 lb 2 oz (78.5 kg)   SpO2 95%   BMI 32.98 kg/m   Wt Readings from Last 3 Encounters:  08/26/19 173 lb 2 oz (78.5 kg)  08/25/19 171 lb (77.6 kg)  06/13/19 170 lb (77.1 kg)    Physical Exam Vitals signs and nursing note reviewed.  Constitutional:      General: She is not in acute distress.    Appearance: Normal appearance. She is well-developed. She is obese. She is not ill-appearing.  HENT:     Head: Normocephalic and atraumatic.     Right Ear: Hearing, tympanic membrane, ear canal and external ear normal.     Left Ear: Hearing, tympanic membrane, ear canal and external ear normal.     Nose: Nose normal.     Mouth/Throat:     Mouth: Mucous membranes are moist.     Pharynx: Oropharynx is clear. Uvula midline. No posterior oropharyngeal erythema.  Eyes:     General: No scleral icterus.    Conjunctiva/sclera: Conjunctivae normal.     Pupils: Pupils are equal, round, and reactive to light.  Neck:     Musculoskeletal: Normal range of motion and neck supple.  Cardiovascular:     Rate and Rhythm: Normal rate and regular rhythm.     Pulses: Normal  pulses.          Radial pulses are 2+ on the right side and 2+ on the left side.     Heart sounds: Normal heart sounds. No murmur.  Pulmonary:     Effort: Pulmonary effort is normal. No respiratory distress.     Breath sounds: Normal breath sounds. No wheezing, rhonchi or rales.  Abdominal:     General: Abdomen is flat. Bowel sounds are normal. There is no distension.     Palpations: Abdomen is soft. There is  no mass.     Tenderness: There is no abdominal tenderness. There is no guarding or rebound.     Hernia: No hernia is present.  Musculoskeletal: Normal range of motion.     Right lower leg: No edema.     Left lower leg: No edema.  Lymphadenopathy:     Cervical: No cervical adenopathy.  Skin:    General: Skin is warm and dry.     Findings: No rash.  Neurological:     General: No focal deficit present.     Mental Status: She is alert and oriented to person, place, and time.     Comments: CN grossly intact, station and gait intact  Psychiatric:        Mood and Affect: Mood normal.        Behavior: Behavior normal.        Thought Content: Thought content normal.        Judgment: Judgment normal.       Lab Results  Component Value Date   HGBA1C 6.3 08/23/2019    Lab Results  Component Value Date   CREATININE 1.03 08/23/2019   BUN 25 (H) 08/23/2019   NA 140 08/23/2019   K 4.2 08/23/2019   CL 103 08/23/2019   CO2 28 08/23/2019    Lab Results  Component Value Date   ALT 24 08/23/2019   AST 17 08/23/2019   ALKPHOS 51 08/23/2019   BILITOT 0.4 08/23/2019    Lab Results  Component Value Date   CHOL 155 08/23/2019   HDL 46.30 08/23/2019   LDLCALC 70 07/02/2015   LDLDIRECT 79.0 08/23/2019   TRIG 226.0 (H) 08/23/2019   CHOLHDL 3 08/23/2019    Assessment & Plan:   Problem List Items Addressed This Visit    Osteopenia    rec continue vit D 1000 IU daily. Encouraged restarting walking routine. Will update DEXA scan       Relevant Orders   DG Bone Density    Obesity, Class I, BMI 30-34.9    Encouraged ongoing healthy diet and lifestyle changes to affect sustainable weight loss. Specifically encouraged renewed efforts at regular walking routine.       NAFLD (nonalcoholic fatty liver disease)    LFTs normal.       HTN (hypertension)    Chronic, stable. Continue current regimen.       Relevant Medications   amLODipine (NORVASC) 10 MG tablet   benazepril (LOTENSIN) 20 MG tablet   hydrochlorothiazide (MICROZIDE) 12.5 MG capsule   rosuvastatin (CRESTOR) 10 MG tablet   fenofibrate 160 MG tablet   History of recurrent UTIs    Appreciate uro care. Doing well with TID cranberry tablets. About to start premarin.       RESOLVED: Frequent epistaxis    S/p cauterization with resolution 2020      Dyslipidemia associated with type 2 diabetes mellitus (HCC)    Chronic, continue statin, fibrate and krill oil. 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       Relevant Medications   benazepril (LOTENSIN) 20 MG tablet   metFORMIN (GLUCOPHAGE-XR) 500 MG 24 hr tablet   rosuvastatin (CRESTOR) 10 MG tablet   CKD stage 3 due to type 2 diabetes mellitus (HCC)    Chronic, stable. Continue to monitor. Encouraged good hydration status.       Relevant Medications   benazepril (LOTENSIN) 20 MG tablet    metFORMIN (GLUCOPHAGE-XR) 500 MG 24 hr tablet   rosuvastatin (CRESTOR) 10 MG tablet   Advanced care planning/counseling discussion - Primary    Advanced directives: husband [name] is [place]. DPOA in chart (05/2014). Planning to update.        Other Visit Diagnoses    Other specified disorders of bone density and structure, other site        Relevant Orders   DG Bone Density       Meds ordered this encounter  Medications  . amLODipine (NORVASC) 10 MG tablet    Sig: Take 1 tablet (10 mg total) by mouth daily.    Dispense:  90 tablet    Refill:  3  . benazepril (LOTENSIN) 20 MG tablet    Sig: Take 1 tablet (20 mg total) by mouth daily.    Dispense:  90 tablet    Refill:  3  . fluticasone (FLONASE) 50 MCG/ACT nasal spray    Sig: USE 2 SPRAYS INTO BOTH  NOSTRILS DAILY AS NEEDED    Dispense:  48 g    Refill:  3  . hydrochlorothiazide (MICROZIDE) 12.5 MG capsule    Sig: Take 1 capsule (12.5 mg total) by mouth daily.    Dispense:  90 capsule    Refill:  3  . metFORMIN (GLUCOPHAGE-XR) 500 MG 24 hr tablet    Sig: Take 1 tablet (500 mg total) by mouth daily with breakfast.    Dispense:  90 tablet    Refill:  3  . rosuvastatin (CRESTOR) 10 MG tablet    Sig: Take 1 tablet (10 mg total) by mouth at bedtime.    Dispense:  90 tablet    Refill:  3  . fenofibrate 160 MG tablet    Sig: Take 1 tablet (160 mg total) by mouth daily.    Dispense:  90 tablet    Refill:  3  . glucose blood (ACCU-CHEK AVIVA PLUS) test strip    Sig: USE AS DIRECTED TO CHECK BLOOD SUGARS ONCE DAILY E11.21    Dispense:  100 each    Refill:  7  . Lancets (ACCU-CHEK SOFT TOUCH) lancets    Sig: Use as instructed to check sugars once daily E11.21    Dispense:  100 each    Refill:  3  . Cholecalciferol (VITAMIN D3) 25 MCG (1000 UT) CAPS    Sig: Take 1 capsule (1,000 Units total) by mouth daily.    Dispense:  30 capsule   Orders Placed This Encounter  Procedures  . DG Bone Density    Standing Status:    Future    Standing Expiration Date:   10/25/2020    Order Specific Question:   Reason for Exam (SYMPTOM  OR DIAGNOSIS REQUIRED)    Answer:   osteopenia f/u    Order Specific Question:   Preferred imaging location?    Answer:   [place]    Patient instructions: Check with pharmacy about metformin XR recall.  If interested, check with pharmacy about new 2 shot shingles series (shingrix).  DEXA updated Continue vit D 1000 units daily.  You are doing well today. Try to increase walking routine.   Follow up plan: Return in about 6 months (around 02/24/2020) for follow up visit.  [name], MD

## 2019-08-26 NOTE — Assessment & Plan Note (Signed)
LFTs normal

## 2019-08-26 NOTE — Assessment & Plan Note (Signed)
S/p cauterization with resolution 2020

## 2019-08-26 NOTE — Assessment & Plan Note (Deleted)
Continue regular f/u with eye doctor

## 2019-08-29 DIAGNOSIS — E119 Type 2 diabetes mellitus without complications: Secondary | ICD-10-CM | POA: Diagnosis not present

## 2019-08-29 DIAGNOSIS — H02045 Spastic entropion of left lower eyelid: Secondary | ICD-10-CM | POA: Diagnosis not present

## 2019-08-29 DIAGNOSIS — H2513 Age-related nuclear cataract, bilateral: Secondary | ICD-10-CM | POA: Diagnosis not present

## 2019-08-29 DIAGNOSIS — H524 Presbyopia: Secondary | ICD-10-CM | POA: Diagnosis not present

## 2019-08-29 DIAGNOSIS — H5213 Myopia, bilateral: Secondary | ICD-10-CM | POA: Diagnosis not present

## 2019-08-29 LAB — HM DIABETES EYE EXAM

## 2019-08-30 ENCOUNTER — Telehealth: Payer: Self-pay | Admitting: *Deleted

## 2019-08-30 NOTE — Telephone Encounter (Signed)
Noted! Thank you

## 2019-08-30 NOTE — Telephone Encounter (Signed)
Pt left a message at triage, she said her metformin XR isn't part of the recall her brand is okay, she checked with [facility]. I did advise pt that Dr. Danise Mina did refill med at appt so if pharmacy said it's okay to get then she can go a head and get it refilled. Pt verbalized understanding but wanted me to send this message to PCP as an [place]

## 2019-08-31 ENCOUNTER — Ambulatory Visit
Admission: RE | Admit: 2019-08-31 | Discharge: 2019-08-31 | Disposition: A | Discharge status: Home / Self Care | Payer: Medicare Other | Source: Ambulatory Visit | Attending: Family Medicine | Admitting: Family Medicine

## 2019-08-31 DIAGNOSIS — Z1231 Encounter for screening mammogram for malignant neoplasm of breast: Secondary | ICD-10-CM | POA: Diagnosis not present

## 2019-08-31 LAB — HM MAMMOGRAPHY

## 2019-09-01 ENCOUNTER — Encounter: Payer: Self-pay | Admitting: Family Medicine

## 2019-09-05 ENCOUNTER — Ambulatory Visit
Admission: RE | Admit: 2019-09-05 | Discharge: 2019-09-05 | Disposition: A | Discharge status: Home / Self Care | Payer: Medicare Other | Source: Ambulatory Visit | Attending: Family Medicine | Admitting: Family Medicine

## 2019-09-05 DIAGNOSIS — M85852 Other specified disorders of bone density and structure, left thigh: Secondary | ICD-10-CM | POA: Diagnosis not present

## 2019-09-05 DIAGNOSIS — M8580 Other specified disorders of bone density and structure, unspecified site: Secondary | ICD-10-CM

## 2019-09-05 DIAGNOSIS — M8588 Other specified disorders of bone density and structure, other site: Secondary | ICD-10-CM

## 2019-09-05 DIAGNOSIS — Z780 Asymptomatic menopausal state: Secondary | ICD-10-CM | POA: Diagnosis not present

## 2019-09-07 ENCOUNTER — Encounter: Payer: Self-pay | Admitting: Family Medicine

## 2019-09-09 ENCOUNTER — Ambulatory Visit (INDEPENDENT_AMBULATORY_CARE_PROVIDER_SITE_OTHER): Payer: Medicare Other | Admitting: Physician Assistant

## 2019-09-09 ENCOUNTER — Telehealth: Payer: Self-pay | Admitting: Urology

## 2019-09-09 ENCOUNTER — Encounter: Payer: Self-pay | Admitting: Physician Assistant

## 2019-09-09 ENCOUNTER — Other Ambulatory Visit: Payer: Self-pay

## 2019-09-09 VITALS — BP 157/90 | HR 108 | Ht 61.0 in | Wt 170.0 lb

## 2019-09-09 DIAGNOSIS — R300 Dysuria: Secondary | ICD-10-CM

## 2019-09-09 DIAGNOSIS — N390 Urinary tract infection, site not specified: Secondary | ICD-10-CM | POA: Diagnosis not present

## 2019-09-09 DIAGNOSIS — N3000 Acute cystitis without hematuria: Secondary | ICD-10-CM

## 2019-09-09 LAB — URINALYSIS, COMPLETE
Bilirubin, UA: NEGATIVE
Glucose, UA: NEGATIVE
Ketones, UA: NEGATIVE
Nitrite, UA: NEGATIVE
Protein,UA: NEGATIVE
RBC, UA: NEGATIVE
Specific Gravity, UA: 1.02 (ref 1.005–1.030)
Urobilinogen, Ur: 0.2 mg/dL (ref 0.2–1.0)
pH, UA: 6.5 (ref 5.0–7.5)

## 2019-09-09 LAB — MICROSCOPIC EXAMINATION
RBC, Urine: NONE SEEN /hpf (ref 0–2)
WBC, UA: >30 /hpf — AB (ref 0–5)

## 2019-09-09 MED ORDER — NITROFURANTOIN MONOHYD MACRO 100 MG PO CAPS: 100.0000 mg | ORAL_CAPSULE | Freq: Two times a day (BID) | ORAL | 0 refills | Status: AC

## 2019-09-09 NOTE — Progress Notes (Signed)
09/09/2019 11:56 AM   Gilmore Laroche Cordaro 05/19/1946 [id number]  CC: Dysuria, urgency, frequency  HPI: Katherine Oliver is a 73 y.o. female who presents today for evaluation of possible UTI. She is an established [facility] patient who last saw Dr. Diamantina Providence on 08/25/2019 for routine follow-up of recurrent UTI without symptoms.  Her last urinary tract infection was treated by me on 06/13/2019.  She is on topical vaginal estrogen cream and daily cranberry supplementation for UTI prevention..  She reports a 1-day history of dysuria, urgency, and frequency. She denies nausea, vomiting, fevers, chills, gross hematuria, and flank pain.  In-office UA today positive for 1+ leukocyte esterase; urine microscopy with >30 WBCs/HPF.  PMH: Past Medical History:  Diagnosis Date  . Allergic rhinitis   . Arthritis   . 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 nephropathy ([place])    [facility] at [facility] 01/2016   . Frequent epistaxis 05/16/2019   S/p cauterization with resolution 2020  . GERD (gastroesophageal reflux disease)   . Hepatic steatosis    by abd [place] 05/2012, mild transaminitis - normal iron sat and viral hep panel (2011), stable [place] 2017  . History of chicken pox   . History of measles   . History of recurrent UTIs    on chronic keflex  . HLD (hyperlipidemia)   . HTN (hypertension)   . 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 Kidney cyst, acquired 01/2016   L kidney by [place]  . Kidney stone 01/2016   L kidney by [place]  . Lung nodules 11/2013   overall stable on f/u CT 01/2016  . Osteopenia 06/2013   mild, forearm T -1.1, hip and spine WNL  . Polycythemia    mild, stable (2013)  . Primary localized osteoarthritis of right knee 01/05/2019  . Rosacea    metrogel    Surgical History: Past Surgical History:  Procedure Laterality Date  .  APPENDECTOMY  1987  . BREAST BIOPSY Right 1963   benign  . BREAST BIOPSY Right 08/2014   benign- core  . cardiolite stress test  04/2004   normal  . CESAREAN SECTION  2703;5009   x2  . CHOLECYSTECTOMY  2003  . COLONOSCOPY  09/26/2008   adenomatous polyp, rpt 3-5 yrs  . COLONOSCOPY  08/2012   adenomatous polyps, diverticulosis, rec rpt 5 yrs [name])  . COLONOSCOPY WITH PROPOFOL N/A 02/05/2018   4TA, SSA, diverticulosis, rpt 3 yrs [name], [name], MD)  . dexa  2003   normal  . dexa  06/2013   [facility] - Tscore -1.1 forearm, normal spine and femur  . 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 TRANSTHORACIC ECHOCARDIOGRAM  04/2019   EF 55-60%, modLVH, impaired relaxation   . TRIGGER FINGER RELEASE  2007;2010;2011   bilateral  . TRIGGER FINGER RELEASE  02/2017  . VAGINAL HYSTERECTOMY  1984   for menorrhagia, ovaries in place    Home Medications:  Allergies as of 09/09/2019      Reactions   Azithromycin Itching   Okay if takes benadryl along with it   Nickel    Reaction to cheap earrings   Sulfa Antibiotics    Adhesive [tape] Rash   Paper  tape - blisters      Medication List       Accurate as of September 09, 2019 11:56 AM. If you have any questions, ask your nurse or doctor.        Accu-Chek Aviva Plus test strip Generic drug: glucose blood USE AS DIRECTED TO CHECK BLOOD SUGARS ONCE DAILY E11.21   accu-chek soft touch lancets Use as instructed to check sugars once daily E11.21   acetaminophen 500 MG tablet Commonly known as: TYLENOL Take 1,000 mg by mouth every 6 (six) hours as needed for moderate pain.   amLODipine 10 MG tablet Commonly known as: NORVASC Take 1 tablet (10 mg total) by mouth daily.   benazepril 20 MG tablet Commonly known as: LOTENSIN Take 1 tablet (20 mg total) by mouth daily.   CALCIUM/VITAMIN D3 GUMMIES PO  Take by mouth daily.   CENTRUM SILVER PO Take 1 tablet by mouth daily.   Cranberry 500 MG Caps Take 1 capsule by mouth 3 (three) times daily.   diphenhydrAMINE 25 MG tablet Commonly known as: BENADRYL Take 25 mg by mouth 2 (two) times daily as needed for allergies.   fenofibrate 160 MG tablet Take 1 tablet (160 mg total) by mouth daily.   fluticasone 50 MCG/ACT nasal spray Commonly known as: FLONASE USE 2 SPRAYS INTO BOTH  NOSTRILS DAILY AS NEEDED   hydrochlorothiazide 12.5 MG capsule Commonly known as: MICROZIDE Take 1 capsule (12.5 mg total) by mouth daily.   Krill Oil Omega-3 500 MG Caps Take 500 mg by mouth daily.   metFORMIN 500 MG 24 hr tablet Commonly known as: GLUCOPHAGE-XR Take 1 tablet (500 mg total) by mouth daily with breakfast.   nitrofurantoin (macrocrystal-monohydrate) 100 MG capsule Commonly known as: MACROBID Take 1 capsule (100 mg total) by mouth every 12 (twelve) hours for 5 days. Started by: Debroah Loop, PA-C   oxymetazoline 0.05 % nasal spray Commonly known as: AFRIN Place 2 sprays into both nostrils 2 (two) times daily.   Premarin vaginal cream Generic drug: conjugated estrogens Place 1 Applicatorful vaginally every other day.   rosuvastatin 10 MG tablet Commonly known as: CRESTOR Take 1 tablet (10 mg total) by mouth at bedtime.   Vitamin D3 25 MCG (1000 UT) Caps Take 1 capsule (1,000 Units total) by mouth daily.       Allergies:  Allergies  Allergen Reactions  . Azithromycin Itching    Okay if takes benadryl along with it  . Nickel     Reaction to cheap earrings  . Sulfa Antibiotics   . Adhesive [Tape] Rash    Paper tape - blisters    Family History: Family History  Problem Relation Age of Onset  . Stroke Mother        several  . Hyperlipidemia Mother   . Hypertension Mother   . Cancer Father        colon  . Hypertension Father   . Hyperlipidemia Father   . Coronary artery disease Father 42       MIx1, CABG   . Cancer Paternal Aunt        abdominal  . Coronary artery disease Maternal Grandmother   . Diabetes Maternal Grandfather   . Coronary artery disease Maternal Grandfather   . Breast cancer Neg Hx     Social History:   reports that she has never smoked. She has never used smokeless tobacco. She reports previous alcohol use of about 1.0 - 2.0 standard drinks of alcohol per week. She  reports that she does not use drugs.  ROS: UROLOGY Frequent Urination?: Yes Hard to postpone urination?: Yes Burning/pain with urination?: Yes Get up at night to urinate?: Yes Leakage of urine?: No Urine stream starts and stops?: No Trouble starting stream?: No Do you have to strain to urinate?: No Blood in urine?: No Urinary tract infection?: Yes Sexually transmitted disease?: No Injury to kidneys or bladder?: No Painful intercourse?: No Weak stream?: No Currently pregnant?: No Last menstrual period?: n  Gastrointestinal Nausea?: No Vomiting?: No Indigestion/heartburn?: No Diarrhea?: No Constipation?: No  Constitutional Fever: No Night sweats?: No Weight loss?: No Fatigue?: No  Skin Skin rash/lesions?: No Itching?: No  Eyes Blurred vision?: No Double vision?: No  Ears/Nose/Throat Sore throat?: No Sinus problems?: No  Hematologic/Lymphatic Swollen glands?: No Easy bruising?: No  Cardiovascular Leg swelling?: No Chest pain?: No  Respiratory Cough?: No Shortness of breath?: No  Endocrine Excessive thirst?: No  Musculoskeletal Back pain?: No Joint pain?: No  Neurological Headaches?: No Dizziness?: No  Psychologic Depression?: No Anxiety?: No  Physical Exam: BP (!) 157/90   Pulse (!) 108   Ht 5\' 1"  (1.549 m)   Wt 170 lb (77.1 kg)   BMI 32.12 kg/m   Constitutional:  Alert and oriented, no acute distress, nontoxic appearing HEENT: [place], AT Cardiovascular: No clubbing, cyanosis, or edema Respiratory: Normal respiratory effort, no increased work of  breathing Skin: No rashes, bruises or suspicious lesions Neurologic: Grossly intact, no focal deficits, moving all 4 extremities Psychiatric: Normal mood and affect  Laboratory Data: Results for orders placed or performed in visit on 09/07/19  HM DIABETES EYE EXAM  Result Value Ref Range   HM Diabetic Eye Exam No Retinopathy No Retinopathy   Assessment & Plan:   1. Recurrent UTI Patient with PMH recurrent UTI already on topical vaginal estrogen cream and cranberry supplementation.  She reports decreased frequency of UTI on these therapies, with most recent infection approximately 3 months ago.  If her urine culture today is positive, we will start her on daily suppressive antibiotics to augment her therapy following treatment of her acute illness. - Urinalysis, Complete  2. Dysuria Patient with symptoms consistent with prior UTIs with significant pyuria on UA today.  Will send for culture. - CULTURE, URINE COMPREHENSIVE  3. Acute cystitis without hematuria Will prescribe empiric Macrobid today and adjust therapy as indicated per urine culture. - nitrofurantoin, macrocrystal-monohydrate, (MACROBID) 100 MG capsule; Take 1 capsule (100 mg total) by mouth every 12 (twelve) hours for 5 days.  Dispense: 10 capsule; Refill: 0  Return if symptoms worsen or fail to improve.  Debroah Loop, PA-C  [facility] [address] [place], [place] [address] [phone]

## 2019-09-13 DIAGNOSIS — L720 Epidermal cyst: Secondary | ICD-10-CM | POA: Diagnosis not present

## 2019-09-13 DIAGNOSIS — D485 Neoplasm of uncertain behavior of skin: Secondary | ICD-10-CM | POA: Diagnosis not present

## 2019-09-13 LAB — CULTURE, URINE COMPREHENSIVE

## 2019-09-20 DIAGNOSIS — Z4802 Encounter for removal of sutures: Secondary | ICD-10-CM | POA: Diagnosis not present

## 2019-09-20 DIAGNOSIS — L720 Epidermal cyst: Secondary | ICD-10-CM | POA: Diagnosis not present

## 2019-12-12 ENCOUNTER — Other Ambulatory Visit: Payer: Self-pay

## 2019-12-12 ENCOUNTER — Encounter: Payer: Self-pay | Admitting: Physician Assistant

## 2019-12-12 ENCOUNTER — Ambulatory Visit (INDEPENDENT_AMBULATORY_CARE_PROVIDER_SITE_OTHER): Payer: Medicare Other | Admitting: Physician Assistant

## 2019-12-12 VITALS — BP 130/80 | HR 71 | Ht 61.0 in | Wt 171.0 lb

## 2019-12-12 DIAGNOSIS — Z87440 Personal history of urinary (tract) infections: Secondary | ICD-10-CM | POA: Diagnosis not present

## 2019-12-12 DIAGNOSIS — N3000 Acute cystitis without hematuria: Secondary | ICD-10-CM | POA: Diagnosis not present

## 2019-12-12 DIAGNOSIS — R350 Frequency of micturition: Secondary | ICD-10-CM | POA: Diagnosis not present

## 2019-12-12 LAB — URINALYSIS, COMPLETE
Bilirubin, UA: NEGATIVE
Glucose, UA: NEGATIVE
Ketones, UA: NEGATIVE
Nitrite, UA: NEGATIVE
Protein,UA: NEGATIVE
RBC, UA: NEGATIVE
Specific Gravity, UA: 1.015 (ref 1.005–1.030)
Urobilinogen, Ur: 1 mg/dL (ref 0.2–1.0)
pH, UA: 7.5 (ref 5.0–7.5)

## 2019-12-12 LAB — MICROSCOPIC EXAMINATION: RBC, Urine: NONE SEEN /hpf (ref 0–2)

## 2019-12-12 MED ORDER — NITROFURANTOIN MONOHYD MACRO 100 MG PO CAPS: 100.0000 mg | ORAL_CAPSULE | Freq: Two times a day (BID) | ORAL | 0 refills | Status: DC

## 2019-12-12 NOTE — Patient Instructions (Addendum)
1. Continue daily cranberry supplements 2. Continue Premarin cream three times weekly 3. Start Macrobid 100mg  twice daily x5 days for treatment of your current infection. I will call you if your urine culture indicates that we need to change your therapy. 4. When you finish the 5 day course of twice daily Macrobid, go pick up your new prescription for suppressive Macrobid, 100mg  once daily x6 months. You may start this the day after you finish the 5-day treatment course.

## 2019-12-12 NOTE — Progress Notes (Signed)
12/12/2019 2:10 PM   Katherine Oliver [birth year] [id number]  CC: Dysuria, urgency, frequency  HPI: Katherine Oliver is a 74 y.o. female who presents today for evaluation of possible UTI. She is an established [facility] patient with PMH recurrent UTI on cranberry supplementation and topical vaginal estrogen cream.  She reports a 1-day history of dysuria, urgency, chills, and frequency. She denies fever, nausea, vomiting, flank pain, and gross hematuria.   Most recent UTI with tetracycline resistant Enterococcus faecalis on 09/09/2019.  She reports no other infections in the interim.  She states she has been taking cranberry supplements 3 times daily and using Premarin cream nightly.  She does report a remote history of Septra prophylaxis.  She states she was on this medication continuously for 4 to 5 years before stopping it.  She states that this resolved her recurrent UTIs for quite some time.  In-office UA today positive for 1+ leukocyte esterase; urine microscopy with >30 WBCs/HPF.  PMH: Past Medical History:  Diagnosis Date  . Allergic rhinitis   . Arthritis   . 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 nephropathy ([place])    DSME at [name] 01/2016   . Frequent epistaxis 05/16/2019   S/p cauterization with resolution 2020  . GERD (gastroesophageal reflux disease)   . Hepatic steatosis    by abd [place] 05/2012, mild transaminitis - normal iron sat and viral hep panel (2011), stable [place] 2017  . History of chicken pox   . History of measles   . History of recurrent UTIs    on chronic keflex  . HLD (hyperlipidemia)   . HTN (hypertension)   . 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 Kidney cyst, acquired 01/2016   L kidney by [place]  . Kidney stone 01/2016   L kidney by [place]  . Lung nodules 11/2013   overall stable on f/u CT  01/2016  . Osteopenia 06/2013   mild, forearm T -1.1, hip and spine WNL  . Polycythemia    mild, stable (2013)  . Primary localized osteoarthritis of right knee 01/05/2019  . Rosacea    metrogel    Surgical History: Past Surgical History:  Procedure Laterality Date  . APPENDECTOMY  1987  . BREAST BIOPSY Right 1963   benign  . BREAST BIOPSY Right 08/2014   benign- core  . cardiolite stress test  04/2004   normal  . CESAREAN SECTION  4196;2229   x2  . CHOLECYSTECTOMY  2003  . COLONOSCOPY  09/26/2008   adenomatous polyp, rpt 3-5 yrs  . COLONOSCOPY  08/2012   adenomatous polyps, diverticulosis, rec rpt 5 yrs [name])  . COLONOSCOPY WITH PROPOFOL N/A 02/05/2018   4TA, SSA, diverticulosis, rpt 3 yrs [name], [name], MD)  . dexa  2003   normal  . dexa  06/2013   [facility] - Tscore -1.1 forearm, normal spine and femur  . 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 TRANSTHORACIC ECHOCARDIOGRAM  04/2019   EF 55-60%, modLVH, impaired relaxation   . TRIGGER FINGER RELEASE  2007;2010;2011   bilateral  . TRIGGER FINGER RELEASE  02/2017  . VAGINAL HYSTERECTOMY  1984   for menorrhagia, ovaries in place    Home  Medications:  Allergies as of 12/12/2019      Reactions   Azithromycin Itching   Okay if takes benadryl along with it   Nickel    Reaction to cheap earrings   Sulfa Antibiotics    Adhesive [tape] Rash   Paper tape - blisters      Medication List       Accurate as of December 12, 2019  2:10 PM. If you have any questions, ask your nurse or doctor.        Accu-Chek Aviva Plus test strip Generic drug: glucose blood USE AS DIRECTED TO CHECK BLOOD SUGARS ONCE DAILY E11.21   accu-chek soft touch lancets Use as instructed to check sugars once daily E11.21   acetaminophen 500 MG tablet Commonly known as: TYLENOL Take 1,000 mg by mouth every 6  (six) hours as needed for moderate pain.   amLODipine 10 MG tablet Commonly known as: NORVASC Take 1 tablet (10 mg total) by mouth daily.   benazepril 20 MG tablet Commonly known as: LOTENSIN Take 1 tablet (20 mg total) by mouth daily.   CALCIUM/VITAMIN D3 GUMMIES PO Take by mouth daily.   CENTRUM SILVER PO Take 1 tablet by mouth daily.   Cranberry 500 MG Caps Take 1 capsule by mouth 3 (three) times daily.   diphenhydrAMINE 25 MG tablet Commonly known as: BENADRYL Take 25 mg by mouth 2 (two) times daily as needed for allergies.   fenofibrate 160 MG tablet Take 1 tablet (160 mg total) by mouth daily.   Fish Oil 1000 MG Caps Take by mouth.   fluticasone 50 MCG/ACT nasal spray Commonly known as: FLONASE USE 2 SPRAYS INTO BOTH  NOSTRILS DAILY AS NEEDED   hydrochlorothiazide 12.5 MG capsule Commonly known as: MICROZIDE Take 1 capsule (12.5 mg total) by mouth daily.   Krill Oil Omega-3 500 MG Caps Take 500 mg by mouth daily.   metFORMIN 500 MG 24 hr tablet Commonly known as: GLUCOPHAGE-XR Take 1 tablet (500 mg total) by mouth daily with breakfast.   nitrofurantoin (macrocrystal-monohydrate) 100 MG capsule Commonly known as: MACROBID Take 1 capsule (100 mg total) by mouth every 12 (twelve) hours for 5 days. Started by: Debroah Loop, PA-C   oxymetazoline 0.05 % nasal spray Commonly known as: AFRIN Place 2 sprays into both nostrils 2 (two) times daily.   Premarin vaginal cream Generic drug: conjugated estrogens Place 1 Applicatorful vaginally every other day.   rosuvastatin 10 MG tablet Commonly known as: CRESTOR Take 1 tablet (10 mg total) by mouth at bedtime.   Vitamin D3 25 MCG (1000 UT) Caps Take 1 capsule (1,000 Units total) by mouth daily.       Allergies:  Allergies  Allergen Reactions  . Azithromycin Itching    Okay if takes benadryl along with it  . Nickel     Reaction to cheap earrings  . Sulfa Antibiotics   . Adhesive [Tape] Rash      Paper tape - blisters    Family History: Family History  Problem Relation Age of Onset  . Stroke Mother        several  . Hyperlipidemia Mother   . Hypertension Mother   . Cancer Father        colon  . Hypertension Father   . Hyperlipidemia Father   . Coronary artery disease Father 64       MIx1, CABG  . Cancer Paternal Aunt        abdominal  . Coronary artery disease  Maternal Grandmother   . Diabetes Maternal Grandfather   . Coronary artery disease Maternal Grandfather   . Breast cancer Neg Hx     Social History:   reports that she has never smoked. She has never used smokeless tobacco. She reports previous alcohol use of about 1.0 - 2.0 standard drinks of alcohol per week. She reports that she does not use drugs.  ROS: UROLOGY Frequent Urination?: Yes Hard to postpone urination?: Yes Burning/pain with urination?: Yes Get up at night to urinate?: Yes Leakage of urine?: No Urine stream starts and stops?: No Trouble starting stream?: No Do you have to strain to urinate?: No Blood in urine?: No Urinary tract infection?: Yes Sexually transmitted disease?: No Injury to kidneys or bladder?: No Painful intercourse?: No Weak stream?: No Currently pregnant?: No Vaginal bleeding?: No Last menstrual period?: n  Gastrointestinal Nausea?: No Vomiting?: No Indigestion/heartburn?: No Diarrhea?: No Constipation?: No  Constitutional Fever: No Night sweats?: No Weight loss?: No Fatigue?: No  Skin Skin rash/lesions?: No Itching?: No  Eyes Blurred vision?: No Double vision?: No  Ears/Nose/Throat Sore throat?: No Sinus problems?: No  Hematologic/Lymphatic Swollen glands?: No Easy bruising?: No  Cardiovascular Leg swelling?: No Chest pain?: No  Respiratory Cough?: No Shortness of breath?: No  Endocrine Excessive thirst?: No  Musculoskeletal Back pain?: No Joint pain?: No  Neurological Headaches?: No Dizziness?:  No  Psychologic Depression?: No Anxiety?: No  Physical Exam: BP 130/80   Pulse 71   Ht 5\' 1"  (1.549 m)   Wt 171 lb (77.6 kg)   BMI 32.31 kg/m   Constitutional:  Alert and oriented, no acute distress, nontoxic appearing HEENT: [place], AT Cardiovascular: No clubbing, cyanosis, or edema Respiratory: Normal respiratory effort, no increased work of breathing Skin: No rashes, bruises or suspicious lesions Neurologic: Grossly intact, no focal deficits, moving all 4 extremities Psychiatric: Normal mood and affect  Laboratory Data: Results for orders placed or performed in visit on 12/12/19  Microscopic Examination   URINE  Result Value Ref Range   WBC, UA >30W 0 - 5 /hpf   RBC None seen 0 - 2 /hpf   Epithelial Cells (non renal) 0-10 0 - 10 /hpf   Renal Epithel, UA 0-10 (A) None seen /hpf   Bacteria, UA Few None seen/Few  Urinalysis, Complete  Result Value Ref Range   Specific Gravity, UA 1.015 1.005 - 1.030   pH, UA 7.5 5.0 - 7.5   Color, UA Yellow Yellow   Appearance Ur Cloudy (A) Clear   Leukocytes,UA 1+ (A) Negative   Protein,UA Negative Negative/Trace   Glucose, UA Negative Negative   Ketones, UA Negative Negative   RBC, UA Negative Negative   Bilirubin, UA Negative Negative   Urobilinogen, Ur 1.0 0.2 - 1.0 mg/dL   Nitrite, UA Negative Negative   Microscopic Examination See below:    Assessment & Plan:   1. Acute cystitis without hematuria 74 year old female with a history of recurrent UTI, here with a 1 day history of dysuria, urgency, and frequency.  UA with pyuria.  Will send for culture to confirm.  Starting her on empiric Macrobid 100 mg twice daily x5 days based on symptoms today.  Counseled her that I will contact her if her urine culture results indicate a necessary change in therapy.  She expressed understanding. - Urinalysis, Complete - nitrofurantoin, macrocrystal-monohydrate, (MACROBID) 100 MG capsule; Take 1 capsule (100 mg total) by mouth every 12 (twelve)  hours for 5 days.  Dispense: 10 capsule; Refill: 0 -  CULTURE, URINE COMPREHENSIVE  2. History of recurrent UTI (urinary tract infection) Frequency of recurrent UTI is decreased on cranberry supplementation and topical vaginal estrogen cream, however I have treated her for 3 urinary tract infections in the last 6 months.  We will start her on prophylactic Macrobid once she completes her treatment course.  I counseled her that we will keep her on daily suppressive antibiotics for approximately 6 months and then take her off of these to see if we have broken the cycle of infections.  I explained that she may continue to experience 1-2 breakthrough infections annually despite antibiotic prophylaxis.  She expressed understanding.  Return in about 6 months (around 06/10/2020) for rUTI follow-up.  Debroah Loop, PA-C  [facility] [address] [place], [place] [address] [phone]

## 2019-12-13 ENCOUNTER — Inpatient Hospital Stay
Admission: EM | Admit: 2019-12-13 | Discharge: 2019-12-13 | DRG: 871 | Disposition: A | Discharge status: Other Acute Inpatient Hospital | Payer: Medicare Other | Attending: Internal Medicine | Admitting: Internal Medicine

## 2019-12-13 ENCOUNTER — Telehealth: Payer: Self-pay

## 2019-12-13 ENCOUNTER — Emergency Department: Payer: Medicare Other

## 2019-12-13 ENCOUNTER — Other Ambulatory Visit: Payer: Self-pay

## 2019-12-13 ENCOUNTER — Inpatient Hospital Stay (HOSPITAL_COMMUNITY)
Admission: AD | Admit: 2019-12-13 | Discharge: 2020-01-03 | DRG: 853 | Disposition: A | Discharge status: Home Health | Payer: Medicare Other | Source: Other Acute Inpatient Hospital | Attending: Internal Medicine | Admitting: Internal Medicine

## 2019-12-13 ENCOUNTER — Other Ambulatory Visit: Payer: Self-pay | Admitting: Physician Assistant

## 2019-12-13 ENCOUNTER — Encounter: Payer: Self-pay | Admitting: Emergency Medicine

## 2019-12-13 DIAGNOSIS — Z794 Long term (current) use of insulin: Secondary | ICD-10-CM

## 2019-12-13 DIAGNOSIS — K920 Hematemesis: Secondary | ICD-10-CM | POA: Diagnosis present

## 2019-12-13 DIAGNOSIS — Z87440 Personal history of urinary (tract) infections: Secondary | ICD-10-CM | POA: Diagnosis not present

## 2019-12-13 DIAGNOSIS — M1711 Unilateral primary osteoarthritis, right knee: Secondary | ICD-10-CM | POA: Diagnosis present

## 2019-12-13 DIAGNOSIS — R0902 Hypoxemia: Secondary | ICD-10-CM

## 2019-12-13 DIAGNOSIS — Z882 Allergy status to sulfonamides status: Secondary | ICD-10-CM

## 2019-12-13 DIAGNOSIS — E1122 Type 2 diabetes mellitus with diabetic chronic kidney disease: Secondary | ICD-10-CM | POA: Diagnosis present

## 2019-12-13 DIAGNOSIS — K760 Fatty (change of) liver, not elsewhere classified: Secondary | ICD-10-CM | POA: Diagnosis present

## 2019-12-13 DIAGNOSIS — Z20822 Contact with and (suspected) exposure to covid-19: Secondary | ICD-10-CM | POA: Diagnosis not present

## 2019-12-13 DIAGNOSIS — R110 Nausea: Secondary | ICD-10-CM | POA: Diagnosis not present

## 2019-12-13 DIAGNOSIS — Z4682 Encounter for fitting and adjustment of non-vascular catheter: Secondary | ICD-10-CM | POA: Diagnosis not present

## 2019-12-13 DIAGNOSIS — Z79899 Other long term (current) drug therapy: Secondary | ICD-10-CM

## 2019-12-13 DIAGNOSIS — K8051 Calculus of bile duct without cholangitis or cholecystitis with obstruction: Secondary | ICD-10-CM | POA: Diagnosis present

## 2019-12-13 DIAGNOSIS — J9811 Atelectasis: Secondary | ICD-10-CM | POA: Diagnosis present

## 2019-12-13 DIAGNOSIS — Z96651 Presence of right artificial knee joint: Secondary | ICD-10-CM | POA: Diagnosis present

## 2019-12-13 DIAGNOSIS — L719 Rosacea, unspecified: Secondary | ICD-10-CM | POA: Diagnosis present

## 2019-12-13 DIAGNOSIS — E66811 Obesity, class 1: Secondary | ICD-10-CM | POA: Diagnosis present

## 2019-12-13 DIAGNOSIS — K2100 Gastro-esophageal reflux disease with esophagitis, without bleeding: Secondary | ICD-10-CM | POA: Diagnosis present

## 2019-12-13 DIAGNOSIS — K2970 Gastritis, unspecified, without bleeding: Secondary | ICD-10-CM | POA: Diagnosis present

## 2019-12-13 DIAGNOSIS — R0600 Dyspnea, unspecified: Secondary | ICD-10-CM

## 2019-12-13 DIAGNOSIS — E872 Acidosis: Secondary | ICD-10-CM | POA: Diagnosis present

## 2019-12-13 DIAGNOSIS — E118 Type 2 diabetes mellitus with unspecified complications: Secondary | ICD-10-CM | POA: Diagnosis present

## 2019-12-13 DIAGNOSIS — E875 Hyperkalemia: Secondary | ICD-10-CM | POA: Diagnosis not present

## 2019-12-13 DIAGNOSIS — G9341 Metabolic encephalopathy: Secondary | ICD-10-CM | POA: Diagnosis not present

## 2019-12-13 DIAGNOSIS — Z452 Encounter for adjustment and management of vascular access device: Secondary | ICD-10-CM | POA: Diagnosis not present

## 2019-12-13 DIAGNOSIS — Z4901 Encounter for fitting and adjustment of extracorporeal dialysis catheter: Secondary | ICD-10-CM | POA: Diagnosis not present

## 2019-12-13 DIAGNOSIS — Z87442 Personal history of urinary calculi: Secondary | ICD-10-CM

## 2019-12-13 DIAGNOSIS — K8050 Calculus of bile duct without cholangitis or cholecystitis without obstruction: Secondary | ICD-10-CM

## 2019-12-13 DIAGNOSIS — R1084 Generalized abdominal pain: Secondary | ICD-10-CM | POA: Diagnosis not present

## 2019-12-13 DIAGNOSIS — N1831 Chronic kidney disease, stage 3a: Secondary | ICD-10-CM | POA: Diagnosis present

## 2019-12-13 DIAGNOSIS — E785 Hyperlipidemia, unspecified: Secondary | ICD-10-CM | POA: Diagnosis present

## 2019-12-13 DIAGNOSIS — E663 Overweight: Secondary | ICD-10-CM | POA: Diagnosis present

## 2019-12-13 DIAGNOSIS — R140 Abdominal distension (gaseous): Secondary | ICD-10-CM

## 2019-12-13 DIAGNOSIS — D509 Iron deficiency anemia, unspecified: Secondary | ICD-10-CM | POA: Diagnosis not present

## 2019-12-13 DIAGNOSIS — E669 Obesity, unspecified: Secondary | ICD-10-CM | POA: Diagnosis present

## 2019-12-13 DIAGNOSIS — R443 Hallucinations, unspecified: Secondary | ICD-10-CM | POA: Diagnosis not present

## 2019-12-13 DIAGNOSIS — J9602 Acute respiratory failure with hypercapnia: Secondary | ICD-10-CM | POA: Diagnosis not present

## 2019-12-13 DIAGNOSIS — E876 Hypokalemia: Secondary | ICD-10-CM | POA: Diagnosis present

## 2019-12-13 DIAGNOSIS — E1136 Type 2 diabetes mellitus with diabetic cataract: Secondary | ICD-10-CM | POA: Diagnosis not present

## 2019-12-13 DIAGNOSIS — K851 Biliary acute pancreatitis without necrosis or infection: Secondary | ICD-10-CM | POA: Diagnosis not present

## 2019-12-13 DIAGNOSIS — N19 Unspecified kidney failure: Secondary | ICD-10-CM | POA: Diagnosis not present

## 2019-12-13 DIAGNOSIS — K257 Chronic gastric ulcer without hemorrhage or perforation: Secondary | ICD-10-CM | POA: Diagnosis not present

## 2019-12-13 DIAGNOSIS — I129 Hypertensive chronic kidney disease with stage 1 through stage 4 chronic kidney disease, or unspecified chronic kidney disease: Secondary | ICD-10-CM | POA: Diagnosis present

## 2019-12-13 DIAGNOSIS — Z8719 Personal history of other diseases of the digestive system: Secondary | ICD-10-CM | POA: Diagnosis present

## 2019-12-13 DIAGNOSIS — E11319 Type 2 diabetes mellitus with unspecified diabetic retinopathy without macular edema: Secondary | ICD-10-CM | POA: Diagnosis present

## 2019-12-13 DIAGNOSIS — Z8349 Family history of other endocrine, nutritional and metabolic diseases: Secondary | ICD-10-CM

## 2019-12-13 DIAGNOSIS — Z951 Presence of aortocoronary bypass graft: Secondary | ICD-10-CM

## 2019-12-13 DIAGNOSIS — I10 Essential (primary) hypertension: Secondary | ICD-10-CM | POA: Diagnosis present

## 2019-12-13 DIAGNOSIS — Z91048 Other nonmedicinal substance allergy status: Secondary | ICD-10-CM

## 2019-12-13 DIAGNOSIS — D696 Thrombocytopenia, unspecified: Secondary | ICD-10-CM | POA: Diagnosis present

## 2019-12-13 DIAGNOSIS — N183 Chronic kidney disease, stage 3 unspecified: Secondary | ICD-10-CM | POA: Diagnosis present

## 2019-12-13 DIAGNOSIS — N390 Urinary tract infection, site not specified: Secondary | ICD-10-CM | POA: Diagnosis present

## 2019-12-13 DIAGNOSIS — N170 Acute kidney failure with tubular necrosis: Secondary | ICD-10-CM | POA: Diagnosis present

## 2019-12-13 DIAGNOSIS — J9601 Acute respiratory failure with hypoxia: Secondary | ICD-10-CM | POA: Diagnosis not present

## 2019-12-13 DIAGNOSIS — Z86010 Personal history of colonic polyps: Secondary | ICD-10-CM

## 2019-12-13 DIAGNOSIS — K219 Gastro-esophageal reflux disease without esophagitis: Secondary | ICD-10-CM | POA: Diagnosis not present

## 2019-12-13 DIAGNOSIS — R6520 Severe sepsis without septic shock: Secondary | ICD-10-CM

## 2019-12-13 DIAGNOSIS — R197 Diarrhea, unspecified: Secondary | ICD-10-CM | POA: Diagnosis present

## 2019-12-13 DIAGNOSIS — M1990 Unspecified osteoarthritis, unspecified site: Secondary | ICD-10-CM | POA: Diagnosis present

## 2019-12-13 DIAGNOSIS — E860 Dehydration: Secondary | ICD-10-CM | POA: Diagnosis present

## 2019-12-13 DIAGNOSIS — J9600 Acute respiratory failure, unspecified whether with hypoxia or hypercapnia: Secondary | ICD-10-CM

## 2019-12-13 DIAGNOSIS — E871 Hypo-osmolality and hyponatremia: Secondary | ICD-10-CM | POA: Diagnosis present

## 2019-12-13 DIAGNOSIS — T508X5A Adverse effect of diagnostic agents, initial encounter: Secondary | ICD-10-CM | POA: Diagnosis not present

## 2019-12-13 DIAGNOSIS — A419 Sepsis, unspecified organism: Principal | ICD-10-CM | POA: Diagnosis present

## 2019-12-13 DIAGNOSIS — N179 Acute kidney failure, unspecified: Secondary | ICD-10-CM | POA: Diagnosis not present

## 2019-12-13 DIAGNOSIS — F29 Unspecified psychosis not due to a substance or known physiological condition: Secondary | ICD-10-CM | POA: Diagnosis not present

## 2019-12-13 DIAGNOSIS — R109 Unspecified abdominal pain: Secondary | ICD-10-CM | POA: Diagnosis not present

## 2019-12-13 DIAGNOSIS — D751 Secondary polycythemia: Secondary | ICD-10-CM | POA: Diagnosis present

## 2019-12-13 DIAGNOSIS — D62 Acute posthemorrhagic anemia: Secondary | ICD-10-CM | POA: Diagnosis present

## 2019-12-13 DIAGNOSIS — K567 Ileus, unspecified: Secondary | ICD-10-CM | POA: Diagnosis present

## 2019-12-13 DIAGNOSIS — Z992 Dependence on renal dialysis: Secondary | ICD-10-CM

## 2019-12-13 DIAGNOSIS — R34 Anuria and oliguria: Secondary | ICD-10-CM | POA: Diagnosis present

## 2019-12-13 DIAGNOSIS — B179 Acute viral hepatitis, unspecified: Secondary | ICD-10-CM | POA: Diagnosis present

## 2019-12-13 DIAGNOSIS — Z9981 Dependence on supplemental oxygen: Secondary | ICD-10-CM

## 2019-12-13 DIAGNOSIS — R221 Localized swelling, mass and lump, neck: Secondary | ICD-10-CM | POA: Diagnosis not present

## 2019-12-13 DIAGNOSIS — Z823 Family history of stroke: Secondary | ICD-10-CM

## 2019-12-13 DIAGNOSIS — M8580 Other specified disorders of bone density and structure, unspecified site: Secondary | ICD-10-CM | POA: Diagnosis present

## 2019-12-13 DIAGNOSIS — Z8249 Family history of ischemic heart disease and other diseases of the circulatory system: Secondary | ICD-10-CM | POA: Diagnosis not present

## 2019-12-13 DIAGNOSIS — K254 Chronic or unspecified gastric ulcer with hemorrhage: Secondary | ICD-10-CM | POA: Diagnosis not present

## 2019-12-13 DIAGNOSIS — J309 Allergic rhinitis, unspecified: Secondary | ICD-10-CM | POA: Diagnosis present

## 2019-12-13 DIAGNOSIS — Z881 Allergy status to other antibiotic agents status: Secondary | ICD-10-CM

## 2019-12-13 DIAGNOSIS — Z6835 Body mass index (BMI) 35.0-35.9, adult: Secondary | ICD-10-CM

## 2019-12-13 DIAGNOSIS — Z833 Family history of diabetes mellitus: Secondary | ICD-10-CM

## 2019-12-13 DIAGNOSIS — K921 Melena: Secondary | ICD-10-CM | POA: Diagnosis present

## 2019-12-13 DIAGNOSIS — Z9109 Other allergy status, other than to drugs and biological substances: Secondary | ICD-10-CM

## 2019-12-13 DIAGNOSIS — Z888 Allergy status to other drugs, medicaments and biological substances status: Secondary | ICD-10-CM

## 2019-12-13 DIAGNOSIS — K250 Acute gastric ulcer with hemorrhage: Secondary | ICD-10-CM | POA: Diagnosis present

## 2019-12-13 DIAGNOSIS — I4891 Unspecified atrial fibrillation: Secondary | ICD-10-CM | POA: Diagnosis not present

## 2019-12-13 DIAGNOSIS — Z9049 Acquired absence of other specified parts of digestive tract: Secondary | ICD-10-CM

## 2019-12-13 DIAGNOSIS — K8511 Biliary acute pancreatitis with uninfected necrosis: Secondary | ICD-10-CM | POA: Diagnosis not present

## 2019-12-13 DIAGNOSIS — K8020 Calculus of gallbladder without cholecystitis without obstruction: Secondary | ICD-10-CM | POA: Diagnosis not present

## 2019-12-13 DIAGNOSIS — D631 Anemia in chronic kidney disease: Secondary | ICD-10-CM | POA: Diagnosis present

## 2019-12-13 DIAGNOSIS — N141 Nephropathy induced by other drugs, medicaments and biological substances: Secondary | ICD-10-CM | POA: Diagnosis not present

## 2019-12-13 DIAGNOSIS — Z800 Family history of malignant neoplasm of digestive organs: Secondary | ICD-10-CM

## 2019-12-13 DIAGNOSIS — E1165 Type 2 diabetes mellitus with hyperglycemia: Secondary | ICD-10-CM | POA: Diagnosis not present

## 2019-12-13 DIAGNOSIS — R1111 Vomiting without nausea: Secondary | ICD-10-CM | POA: Diagnosis not present

## 2019-12-13 DIAGNOSIS — R000 Tachycardia, unspecified: Secondary | ICD-10-CM | POA: Diagnosis present

## 2019-12-13 DIAGNOSIS — T829XXA Unspecified complication of cardiac and vascular prosthetic device, implant and graft, initial encounter: Secondary | ICD-10-CM

## 2019-12-13 DIAGNOSIS — I959 Hypotension, unspecified: Secondary | ICD-10-CM | POA: Diagnosis not present

## 2019-12-13 DIAGNOSIS — R1032 Left lower quadrant pain: Secondary | ICD-10-CM | POA: Diagnosis not present

## 2019-12-13 DIAGNOSIS — R7989 Other specified abnormal findings of blood chemistry: Secondary | ICD-10-CM | POA: Diagnosis not present

## 2019-12-13 DIAGNOSIS — E1169 Type 2 diabetes mellitus with other specified complication: Secondary | ICD-10-CM | POA: Diagnosis not present

## 2019-12-13 DIAGNOSIS — E8770 Fluid overload, unspecified: Secondary | ICD-10-CM | POA: Diagnosis not present

## 2019-12-13 DIAGNOSIS — Z7984 Long term (current) use of oral hypoglycemic drugs: Secondary | ICD-10-CM | POA: Diagnosis not present

## 2019-12-13 DIAGNOSIS — R0602 Shortness of breath: Secondary | ICD-10-CM | POA: Diagnosis not present

## 2019-12-13 DIAGNOSIS — Z4659 Encounter for fitting and adjustment of other gastrointestinal appliance and device: Secondary | ICD-10-CM

## 2019-12-13 DIAGNOSIS — Z6830 Body mass index (BMI) 30.0-30.9, adult: Secondary | ICD-10-CM

## 2019-12-13 DIAGNOSIS — B952 Enterococcus as the cause of diseases classified elsewhere: Secondary | ICD-10-CM | POA: Diagnosis present

## 2019-12-13 DIAGNOSIS — J181 Lobar pneumonia, unspecified organism: Secondary | ICD-10-CM | POA: Diagnosis not present

## 2019-12-13 DIAGNOSIS — K859 Acute pancreatitis without necrosis or infection, unspecified: Secondary | ICD-10-CM | POA: Diagnosis not present

## 2019-12-13 DIAGNOSIS — R918 Other nonspecific abnormal finding of lung field: Secondary | ICD-10-CM | POA: Diagnosis not present

## 2019-12-13 DIAGNOSIS — R451 Restlessness and agitation: Secondary | ICD-10-CM | POA: Diagnosis not present

## 2019-12-13 LAB — URINALYSIS, COMPLETE (UACMP) WITH MICROSCOPIC
Bacteria, UA: NONE SEEN
Bilirubin Urine: NEGATIVE
Glucose, UA: 50 mg/dL — AB
Hgb urine dipstick: NEGATIVE
Ketones, ur: NEGATIVE mg/dL
Leukocytes,Ua: NEGATIVE
Nitrite: POSITIVE — AB
Protein, ur: NEGATIVE mg/dL
Specific Gravity, Urine: 1.021 (ref 1.005–1.030)
Squamous Epithelial / HPF: NONE SEEN (ref 0–5)
pH: 5 (ref 5.0–8.0)

## 2019-12-13 LAB — CBC
HCT: 51.2 % — ABNORMAL HIGH (ref 36.0–46.0)
Hemoglobin: 16.2 g/dL — ABNORMAL HIGH (ref 12.0–15.0)
MCH: 26.8 pg (ref 26.0–34.0)
MCHC: 31.6 g/dL (ref 30.0–36.0)
MCV: 84.6 fL (ref 80.0–100.0)
Platelets: 356 10*3/uL (ref 150–400)
RBC: 6.05 MIL/uL — ABNORMAL HIGH (ref 3.87–5.11)
RDW: 13.9 % (ref 11.5–15.5)
WBC: 26.4 10*3/uL — ABNORMAL HIGH (ref 4.0–10.5)
nRBC: 0 % (ref 0.0–0.2)

## 2019-12-13 LAB — RESPIRATORY PANEL BY RT PCR (FLU A&B, COVID)
Influenza A by PCR: NEGATIVE
Influenza B by PCR: NEGATIVE
SARS Coronavirus 2 by RT PCR: NEGATIVE

## 2019-12-13 LAB — COMPREHENSIVE METABOLIC PANEL
ALT: 120 U/L — ABNORMAL HIGH (ref 0–44)
ALT: 390 U/L — ABNORMAL HIGH (ref 0–44)
AST: 194 U/L — ABNORMAL HIGH (ref 15–41)
AST: 542 U/L — ABNORMAL HIGH (ref 15–41)
Albumin: 4.9 g/dL (ref 3.5–5.0)
Albumin: 4.6 g/dL (ref 3.5–5.0)
Alkaline Phosphatase: 68 U/L (ref 38–126)
Alkaline Phosphatase: 78 U/L (ref 38–126)
Anion gap: 17 — ABNORMAL HIGH (ref 5–15)
Anion gap: 20 — ABNORMAL HIGH (ref 5–15)
BUN: 26 mg/dL — ABNORMAL HIGH (ref 8–23)
BUN: 25 mg/dL — ABNORMAL HIGH (ref 8–23)
CO2: 22 mmol/L (ref 22–32)
CO2: 18 mmol/L — ABNORMAL LOW (ref 22–32)
Calcium: 10.3 mg/dL (ref 8.9–10.3)
Calcium: 9.8 mg/dL (ref 8.9–10.3)
Chloride: 101 mmol/L (ref 98–111)
Chloride: 103 mmol/L (ref 98–111)
Creatinine, Ser: 1.09 mg/dL — ABNORMAL HIGH (ref 0.44–1.00)
Creatinine, Ser: 1.57 mg/dL — ABNORMAL HIGH (ref 0.44–1.00)
GFR calc Af Amer: 58 mL/min — ABNORMAL LOW (ref >60)
GFR calc Af Amer: 37 mL/min — ABNORMAL LOW (ref >60)
GFR calc non Af Amer: 50 mL/min — ABNORMAL LOW (ref >60)
GFR calc non Af Amer: 32 mL/min — ABNORMAL LOW (ref >60)
Glucose, Bld: 219 mg/dL — ABNORMAL HIGH (ref 70–99)
Glucose, Bld: 265 mg/dL — ABNORMAL HIGH (ref 70–99)
Potassium: 3.6 mmol/L (ref 3.5–5.1)
Potassium: 4.8 mmol/L (ref 3.5–5.1)
Sodium: 140 mmol/L (ref 135–145)
Sodium: 141 mmol/L (ref 135–145)
Total Bilirubin: 2 mg/dL — ABNORMAL HIGH (ref 0.3–1.2)
Total Bilirubin: 2 mg/dL — ABNORMAL HIGH (ref 0.3–1.2)
Total Protein: 7.9 g/dL (ref 6.5–8.1)
Total Protein: 7.1 g/dL (ref 6.5–8.1)

## 2019-12-13 LAB — LIPID PANEL
Cholesterol: 160 mg/dL (ref 0–200)
HDL: 52 mg/dL (ref >40)
LDL Cholesterol: 74 mg/dL (ref 0–99)
Total CHOL/HDL Ratio: 3.1 RATIO
Triglycerides: 169 mg/dL — ABNORMAL HIGH (ref <150)
VLDL: 34 mg/dL (ref 0–40)

## 2019-12-13 LAB — LACTIC ACID, PLASMA
Lactic Acid, Venous: 4.4 mmol/L (ref 0.5–1.9)
Lactic Acid, Venous: 4.9 mmol/L (ref 0.5–1.9)
Lactic Acid, Venous: 6.2 mmol/L (ref 0.5–1.9)

## 2019-12-13 LAB — GLUCOSE, CAPILLARY: Glucose-Capillary: 220 mg/dL — ABNORMAL HIGH (ref 70–99)

## 2019-12-13 LAB — PROCALCITONIN: Procalcitonin: 3.47 ng/mL

## 2019-12-13 LAB — LIPASE, BLOOD: Lipase: 5205 U/L — ABNORMAL HIGH (ref 11–51)

## 2019-12-13 MED ORDER — HYDROMORPHONE HCL 1 MG/ML IJ SOLN
0.5000 mg | Freq: Once | INTRAMUSCULAR | Status: AC
  Administered 2019-12-13: 15:00:00 0.5 mg via INTRAVENOUS
  Filled 2019-12-13: qty 1

## 2019-12-13 MED ORDER — PIPERACILLIN-TAZOBACTAM 3.375 G IVPB
3.3750 g | Freq: Three times a day (TID) | INTRAVENOUS | Status: DC
  Administered 2019-12-14 – 2019-12-15 (×4): 3.375 g via INTRAVENOUS
  Filled 2019-12-13 (×7): qty 50

## 2019-12-13 MED ORDER — PANTOPRAZOLE SODIUM 40 MG IV SOLR: 40.0000 mg | INTRAVENOUS | 0 refills | Status: DC

## 2019-12-13 MED ORDER — SODIUM CHLORIDE 0.9 % IV BOLUS: 1000.0000 mL | Freq: Once | INTRAVENOUS | Status: DC

## 2019-12-13 MED ORDER — ONDANSETRON HCL 4 MG PO TABS: 4.0000 mg | ORAL_TABLET | Freq: Four times a day (QID) | ORAL | Status: DC | PRN

## 2019-12-13 MED ORDER — ONDANSETRON HCL 4 MG/2ML IJ SOLN
4.0000 mg | Freq: Once | INTRAMUSCULAR | Status: AC
  Administered 2019-12-13: 15:00:00 4 mg via INTRAVENOUS
  Filled 2019-12-13: qty 2

## 2019-12-13 MED ORDER — HYDROMORPHONE HCL 1 MG/ML IJ SOLN
1.0000 mg | INTRAMUSCULAR | Status: DC | PRN
  Administered 2019-12-13: 1 mg via INTRAVENOUS
  Filled 2019-12-13: qty 1

## 2019-12-13 MED ORDER — ACETAMINOPHEN 325 MG PO TABS: 650.0000 mg | ORAL_TABLET | Freq: Four times a day (QID) | ORAL | Status: DC | PRN

## 2019-12-13 MED ORDER — INSULIN ASPART 100 UNIT/ML ~~LOC~~ SOLN: 0.0000 [IU] | SUBCUTANEOUS | 11 refills | Status: DC

## 2019-12-13 MED ORDER — HYDROMORPHONE HCL 1 MG/ML IJ SOLN
0.5000 mg | INTRAMUSCULAR | Status: DC | PRN
  Administered 2019-12-13: 0.5 mg via INTRAVENOUS
  Filled 2019-12-13: qty 0.5

## 2019-12-13 MED ORDER — MORPHINE SULFATE (PF) 2 MG/ML IV SOLN: 2.0000 mg | INTRAVENOUS | Status: DC | PRN

## 2019-12-13 MED ORDER — NITROFURANTOIN MONOHYD MACRO 100 MG PO CAPS: 100.0000 mg | ORAL_CAPSULE | Freq: Every day | ORAL | 5 refills | Status: DC

## 2019-12-13 MED ORDER — ACETAMINOPHEN 650 MG RE SUPP: 650.0000 mg | Freq: Four times a day (QID) | RECTAL | Status: DC | PRN

## 2019-12-13 MED ORDER — ENOXAPARIN SODIUM 40 MG/0.4ML ~~LOC~~ SOLN: 40.0000 mg | SUBCUTANEOUS | Status: DC

## 2019-12-13 MED ORDER — HYDROMORPHONE HCL 1 MG/ML IJ SOLN
0.5000 mg | INTRAMUSCULAR | Status: DC | PRN
  Administered 2019-12-14 (×2): 1 mg via INTRAVENOUS
  Administered 2019-12-14: 04:00:00 0.5 mg via INTRAVENOUS
  Administered 2019-12-14 – 2019-12-15 (×3): 1 mg via INTRAVENOUS
  Filled 2019-12-13 (×4): qty 1
  Filled 2019-12-13: qty 0.5
  Filled 2019-12-13: qty 1

## 2019-12-13 MED ORDER — ONDANSETRON HCL 4 MG PO TABS
4.0000 mg | ORAL_TABLET | Freq: Four times a day (QID) | ORAL | Status: DC | PRN
  Administered 2019-12-23 – 2019-12-31 (×2): 4 mg via ORAL
  Filled 2019-12-13 (×3): qty 1

## 2019-12-13 MED ORDER — SODIUM CHLORIDE 0.9 % IV SOLN: INTRAVENOUS | Status: DC

## 2019-12-13 MED ORDER — ONDANSETRON HCL 4 MG/2ML IJ SOLN: 4.0000 mg | Freq: Four times a day (QID) | INTRAMUSCULAR | Status: DC | PRN

## 2019-12-13 MED ORDER — ONDANSETRON HCL 4 MG/2ML IJ SOLN
4.0000 mg | Freq: Four times a day (QID) | INTRAMUSCULAR | Status: DC | PRN
  Administered 2019-12-13 – 2019-12-31 (×12): 4 mg via INTRAVENOUS
  Filled 2019-12-13 (×12): qty 2

## 2019-12-13 MED ORDER — IOHEXOL 350 MG/ML SOLN
75.0000 mL | Freq: Once | INTRAVENOUS | Status: AC | PRN
  Administered 2019-12-13: 75 mL via INTRAVENOUS
  Filled 2019-12-13: qty 75

## 2019-12-13 MED ORDER — HYDROMORPHONE HCL 1 MG/ML IJ SOLN
0.5000 mg | Freq: Once | INTRAMUSCULAR | Status: AC
  Administered 2019-12-13: 0.5 mg via INTRAVENOUS
  Filled 2019-12-13: qty 1

## 2019-12-13 MED ORDER — ACETAMINOPHEN 325 MG PO TABS
650.0000 mg | ORAL_TABLET | Freq: Four times a day (QID) | ORAL | Status: DC | PRN
  Administered 2019-12-17 – 2020-01-03 (×10): 650 mg via ORAL
  Filled 2019-12-13 (×12): qty 2

## 2019-12-13 MED ORDER — INSULIN ASPART 100 UNIT/ML ~~LOC~~ SOLN
0.0000 [IU] | SUBCUTANEOUS | Status: DC
  Administered 2019-12-13 – 2019-12-14 (×3): 3 [IU] via SUBCUTANEOUS
  Administered 2019-12-14 – 2019-12-16 (×11): 2 [IU] via SUBCUTANEOUS
  Administered 2019-12-16 (×4): 1 [IU] via SUBCUTANEOUS
  Administered 2019-12-17: 5 [IU] via SUBCUTANEOUS
  Administered 2019-12-17 (×2): 3 [IU] via SUBCUTANEOUS
  Administered 2019-12-17: 2 [IU] via SUBCUTANEOUS

## 2019-12-13 MED ORDER — SODIUM CHLORIDE 0.9 % IV BOLUS
1000.0000 mL | Freq: Once | INTRAVENOUS | Status: AC
  Administered 2019-12-13: 1000 mL via INTRAVENOUS

## 2019-12-13 MED ORDER — PANTOPRAZOLE SODIUM 40 MG IV SOLR: 40.0000 mg | INTRAVENOUS | Status: DC

## 2019-12-13 MED ORDER — PIPERACILLIN-TAZOBACTAM 3.375 G IVPB 30 MIN
3.3750 g | Freq: Once | INTRAVENOUS | Status: AC
  Administered 2019-12-13: 3.375 g via INTRAVENOUS
  Filled 2019-12-13: qty 50

## 2019-12-13 MED ORDER — NALOXONE HCL 0.4 MG/ML IJ SOLN: 0.4000 mg | INTRAMUSCULAR | Status: DC | PRN

## 2019-12-13 MED ORDER — ONDANSETRON HCL 4 MG PO TABS: 4.0000 mg | ORAL_TABLET | Freq: Four times a day (QID) | ORAL | 0 refills | Status: DC | PRN

## 2019-12-13 MED ORDER — HYDROMORPHONE HCL 1 MG/ML IJ SOLN
0.5000 mg | Freq: Once | INTRAMUSCULAR | Status: AC
  Administered 2019-12-13: 0.5 mg via INTRAVENOUS
  Filled 2019-12-13: qty 0.5

## 2019-12-13 MED ORDER — INSULIN ASPART 100 UNIT/ML ~~LOC~~ SOLN: 0.0000 [IU] | SUBCUTANEOUS | Status: DC

## 2019-12-13 NOTE — Sepsis Progress Note (Signed)
Notified provider of need to order repeat lactic acid. ° °

## 2019-12-13 NOTE — ED Provider Notes (Addendum)
[facility] Emergency Department Provider Note  ____________________________________________   First MD Initiated Contact with Patient 12/13/19 1425     (approximate)  I have reviewed the triage vital signs and the nursing notes.   HISTORY  Chief Complaint Abdominal Pain    HPI Katherine Oliver is a 74 y.o. female with hepatic steatosis, kidney stone who comes in with abdominal pain.  Patient has had nausea vomiting and diarrhea that started at 11 AM today.  Patient was diagnosed with a UTI yesterday and started on Macrobid.  Patient stated that her pain is severe, constant, lower abdomen, nothing makes it better, nothing makes it worse.  Denies having this previously.  Has had her appendix and gallbladder removed.  Also had a hysterectomy.  Patient also noted to be satting 92% on room air.  States that that is abnormal for her.  Denies feeling short of breath but is not on any blood thinners.          Past Medical History:  Diagnosis Date  . Allergic rhinitis   . Arthritis   . 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 nephropathy ([place])    DSME at [facility] 01/2016   . Frequent epistaxis 05/16/2019   S/p cauterization with resolution 2020  . GERD (gastroesophageal reflux disease)   . Hepatic steatosis    by abd [place] 05/2012, mild transaminitis - normal iron sat and viral hep panel (2011), stable [place] 2017  . History of chicken pox   . History of measles   . History of recurrent UTIs    on chronic keflex  . HLD (hyperlipidemia)   . HTN (hypertension)   . 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 Kidney cyst, acquired 01/2016   L kidney by [place]  . Kidney stone 01/2016   L kidney by [place]  . Lung nodules 11/2013   overall stable on f/u CT 01/2016  . Osteopenia 06/2013   mild, forearm T -1.1, hip and  spine WNL  . Polycythemia    mild, stable (2013)  . Primary localized osteoarthritis of right knee 01/05/2019  . Rosacea    metrogel    Patient Active Problem List   Diagnosis Date Noted  . Primary localized osteoarthritis of right knee 01/05/2019  . 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 Hx of adenomatous polyp of colon 12/15/2017  . Vulvar dermatitis 12/07/2017  . Stress due to illness of family member 08/18/2017  . Right hip pain 08/07/2016  . CKD stage 3 due to type 2 diabetes mellitus ([place]) 04/04/2016  . Trigger ring finger of right hand 04/04/2016  . Pulmonary nodules 01/03/2016  . Advanced care planning/counseling discussion 07/05/2015  . Obesity, Class I, BMI 30-34.9 07/05/2015  . 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 Osteopenia 06/03/2013  . Medicare annual wellness visit, subsequent 05/31/2012  . Rotator cuff tendonitis, right 03/01/2012  . Polycythemia 12/02/2011  . 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 HTN (hypertension)   . Dyslipidemia associated with type 2 diabetes mellitus ([place])   . History of recurrent UTIs   . Allergic rhinitis   . GERD (gastroesophageal reflux disease)   . Rosacea   . NAFLD (nonalcoholic fatty liver disease) 06/03/2010    Past  Surgical History:  Procedure Laterality Date  . APPENDECTOMY  1987  . BREAST BIOPSY Right 1963   benign  . BREAST BIOPSY Right 08/2014   benign- core  . cardiolite stress test  04/2004   normal  . CESAREAN SECTION  1505;6979   x2  . CHOLECYSTECTOMY  2003  . COLONOSCOPY  09/26/2008   adenomatous polyp, rpt 3-5 yrs  . COLONOSCOPY  08/2012   adenomatous polyps, diverticulosis, rec rpt 5 yrs [name])  . COLONOSCOPY WITH PROPOFOL N/A 02/05/2018   4TA, SSA, diverticulosis, rpt 3 yrs [name], [name], MD)  . dexa  2003   normal  . dexa  06/2013   [facility] - Tscore -1.1 forearm, normal spine and femur  .  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 TRANSTHORACIC ECHOCARDIOGRAM  04/2019   EF 55-60%, modLVH, impaired relaxation   . TRIGGER FINGER RELEASE  2007;2010;2011   bilateral  . TRIGGER FINGER RELEASE  02/2017  . VAGINAL HYSTERECTOMY  1984   for menorrhagia, ovaries in place    Prior to Admission medications   Medication Sig Start Date End Date Taking? Authorizing Provider  acetaminophen (TYLENOL) 500 MG tablet Take 1,000 mg by mouth every 6 (six) hours as needed for moderate pain.     [provider]  amLODipine (NORVASC) 10 MG tablet Take 1 tablet (10 mg total) by mouth daily. 08/26/19   [name], MD  benazepril (LOTENSIN) 20 MG tablet Take 1 tablet (20 mg total) by mouth daily. 08/26/19   [name], MD  Ca Phosphate-Cholecalciferol (CALCIUM/VITAMIN D3 GUMMIES PO) Take by mouth daily.    [provider]  Cholecalciferol (VITAMIN D3) 25 MCG (1000 UT) CAPS Take 1 capsule (1,000 Units total) by mouth daily. 08/26/19   [name], MD  conjugated estrogens (PREMARIN) vaginal cream Place 1 Applicatorful vaginally every other day. 08/25/19   Billey Co, MD  Cranberry 500 MG CAPS Take 1 capsule by mouth 3 (three) times daily.    [provider]  diphenhydrAMINE (BENADRYL) 25 MG tablet Take 25 mg by mouth 2 (two) times daily as needed for allergies.     [provider]  fenofibrate 160 MG tablet Take 1 tablet (160 mg total) by mouth daily. 08/26/19   [name], MD  fluticasone [facility]) 50 MCG/ACT nasal spray USE 2 SPRAYS INTO BOTH  NOSTRILS DAILY AS NEEDED 08/26/19   [name], MD  glucose blood (ACCU-CHEK AVIVA PLUS) test strip USE AS DIRECTED TO CHECK BLOOD SUGARS ONCE DAILY E11.21 08/26/19   [name], MD  hydrochlorothiazide (MICROZIDE) 12.5 MG capsule Take 1 capsule (12.5 mg  total) by mouth daily. 08/26/19   [name], MD  Javier Docker Oil Omega-3 500 MG CAPS Take 500 mg by mouth daily.     [provider]  Lancets (ACCU-CHEK SOFT TOUCH) lancets Use as instructed to check sugars once daily E11.21 08/26/19   [name], MD  metFORMIN (GLUCOPHAGE-XR) 500 MG 24 hr tablet Take 1 tablet (500 mg total) by mouth daily with breakfast. 08/26/19   [name], MD  Multiple Vitamins-Minerals (CENTRUM SILVER PO) Take 1 tablet by mouth daily.    [provider]  nitrofurantoin, macrocrystal-monohydrate, (MACROBID) 100 MG capsule Take 1 capsule (100 mg total) by mouth every 12 (twelve) hours for 5 days. 12/12/19 12/17/19  Debroah Loop, PA-C  nitrofurantoin, macrocrystal-monohydrate, (MACROBID) 100 MG capsule Take  1 capsule (100 mg total) by mouth daily. 12/18/19 06/15/20  Debroah Loop, PA-C  Omega-3 Fatty Acids (FISH OIL) 1000 MG CAPS Take by mouth.    [provider]  oxymetazoline (AFRIN) 0.05 % nasal spray Place 2 sprays into both nostrils 2 (two) times daily.    [provider]  rosuvastatin (CRESTOR) 10 MG tablet Take 1 tablet (10 mg total) by mouth at bedtime. 08/26/19   [name], MD    Allergies Azithromycin, Nickel, Sulfa antibiotics, and Adhesive [tape]  Family History  Problem Relation Age of Onset  . Stroke Mother        several  . Hyperlipidemia Mother   . Hypertension Mother   . Cancer Father        colon  . Hypertension Father   . Hyperlipidemia Father   . Coronary artery disease Father 25       MIx1, CABG  . Cancer Paternal Aunt        abdominal  . Coronary artery disease Maternal Grandmother   . Diabetes Maternal Grandfather   . Coronary artery disease Maternal Grandfather   . Breast cancer Neg Hx     Social History Social History   Tobacco Use  . Smoking status: Never Smoker  . Smokeless tobacco: Never Used  Substance Use Topics  . Alcohol use: Not Currently     Alcohol/week: 1.0 - 2.0 standard drinks    Types: 1 - 2 Glasses of wine per week    Comment: Occasional/1 mixed drink per month  . Drug use: No      Review of Systems Constitutional: No fever/chills Eyes: No visual changes. ENT: No sore throat. Cardiovascular: Denies chest pain. Respiratory: Denies shortness of breath. Gastrointestinal: Positive abdominal pain, no nausea and vomiting Genitourinary: Negative for dysuria. Musculoskeletal: Negative for back pain. Skin: Negative for rash. Neurological: Negative for headaches, focal weakness or numbness. All other ROS negative ____________________________________________   PHYSICAL EXAM:  VITAL SIGNS: ED Triage Vitals  Enc Vitals Group     BP 12/13/19 1357 (!) 146/77     Pulse Rate 12/13/19 1357 94     Resp 12/13/19 1357 20     Temp 12/13/19 1357 97.8 F (36.6 C)     Temp Source 12/13/19 1357 Oral     SpO2 12/13/19 1357 95 %     Weight --      Height --      Head Circumference --      Peak Flow --      Pain Score 12/13/19 1358 10     Pain Loc --      Pain Edu? --      Excl. in [place]? --     Constitutional: Alert and oriented. Well appearing and in no acute distress. Eyes: Conjunctivae are normal. EOMI. Head: Atraumatic. Nose: No congestion/rhinnorhea. Mouth/Throat: Mucous membranes are moist.   Neck: No stridor. Trachea Midline. FROM Cardiovascular: Normal rate, regular rhythm. Grossly normal heart sounds.  Good peripheral circulation. Respiratory: Normal respiratory effort.  No retractions. Lungs CTAB.  Patient satting 92% on room air Gastrointestinal: Tenderness throughout her abdomen.  No distention. No abdominal bruits.  Musculoskeletal: No lower extremity tenderness nor edema.  No joint effusions. Neurologic:  Normal speech and language. No gross focal neurologic deficits are appreciated.  Skin:  Skin is warm, dry and intact. No rash noted. Psychiatric: Mood and affect are normal. Speech and behavior are  normal. GU: Deferred   ____________________________________________   LABS (all labs ordered are  listed, but only abnormal results are displayed)  Labs Reviewed  LIPASE, BLOOD - Abnormal; Notable for the following components:      Result Value   Lipase 5,205 (*)    All other components within normal limits  COMPREHENSIVE METABOLIC PANEL - Abnormal; Notable for the following components:   Glucose, Bld 219 (*)    BUN 26 (*)    Creatinine, Ser 1.09 (*)    AST 194 (*)    ALT 120 (*)    Total Bilirubin 2.0 (*)    GFR calc non Af Amer 50 (*)    GFR calc Af Amer 58 (*)    Anion gap 17 (*)    All other components within normal limits  CBC - Abnormal; Notable for the following components:   WBC 26.4 (*)    RBC 6.05 (*)    Hemoglobin 16.2 (*)    HCT 51.2 (*)    All other components within normal limits  RESPIRATORY PANEL BY RT PCR (FLU A&B, COVID)  CULTURE, BLOOD (ROUTINE X 2)  CULTURE, BLOOD (ROUTINE X 2)  URINALYSIS, COMPLETE (UACMP) WITH MICROSCOPIC  LACTIC ACID, PLASMA  LACTIC ACID, PLASMA   ____________________________________________   ED ECG REPORT I, Vanessa Millard, the attending physician, personally viewed and interpreted this ECG.  EKG sinus tachycardia rate of 110, no S elevations, no T wave inversions, normal intervals ____________________________________________  RADIOLOGY  Official radiology report(s): CT Angio Chest PE W and/or Wo Contrast  Result Date: 12/13/2019 CLINICAL DATA:  74 year old female with lower abdominal pain. Shortness of breath. EXAM: CT ANGIOGRAPHY CHEST CT ABDOMEN AND PELVIS WITH CONTRAST TECHNIQUE: Multidetector CT imaging of the chest was performed using the standard protocol during bolus administration of intravenous contrast. Multiplanar CT image reconstructions and MIPs were obtained to evaluate the vascular anatomy. Multidetector CT imaging of the abdomen and pelvis was performed using the standard protocol during bolus administration of  intravenous contrast. CONTRAST:  69mL OMNIPAQUE IOHEXOL 350 MG/ML SOLN COMPARISON:  Chest CT 01/11/2016 and CT abdomen 12/02/2013 FINDINGS: CTA CHEST FINDINGS Cardiovascular: Negative for pulmonary embolism. Normal caliber of the thoracic aorta. Heart size is within normal limits. No significant pericardial fluid. Great vessels patent. Mediastinum/Nodes: Normal appearance of mediastinal structures. No significant node enlargement in the mediastinum or hilar regions. There are small right axillary lymph nodes. However, these right axillary lymph nodes have clearly enlarged from the exam in 2017. Index lymph node measures up to 8 mm on sequence 1, image 21 and this lymph node was barely perceptible on the prior examination. Mild lymphadenopathy in the right axilla is nonspecific. No significant left axillary lymph node enlargement. Lungs/Pleura: Trachea and mainstem bronchi are patent. Dependent atelectasis in both lower lobes. Again noted are small scattered pulmonary nodules. Index lesion in the right middle lobe on sequence 4, image 46 measures 4 mm and stable. There are additional small scattered pulmonary nodules predominantly along the pleural surfaces. Majority of these small pulmonary nodules are stable. Limited evaluation of the lung bases due to the atelectasis. No large pleural effusions. Question a new 2 mm nodule in the periphery of the right lower lobe on sequence 4, image 51. Musculoskeletal: No acute bone abnormality. Review of the MIP images confirms the above findings. CT ABDOMEN and PELVIS FINDINGS Hepatobiliary: Diffuse low-attenuation of the liver is compatible with steatosis. Gallbladder has been removed. Subtle low-density along periphery of the posterior right hepatic lobe on sequence 4, image 20 is nonspecific. There is mild intrahepatic biliary dilatation. Mild  dilatation of the common bile duct measuring up to 9 mm on sequence 4, image 38. Large amount of edema and stranding in the porta  hepatis. There is a calcification near the distal common bile duct and this is compatible with an obstructing bile duct stone. Pancreas: Extensive edema and inflammatory changes in the upper abdomen centered around the pancreas. Pancreatic enhancement is heterogeneous and compatible with edema and possibly areas of pancreatic necrosis. No discrete fluid or pseudocyst formations. No significant pancreatic duct dilatation. Spleen: Normal in size without focal abnormality. Adrenals/Urinary Tract: Normal adrenal glands. Stable appearance of the right kidney. Tiny hypodensities along the right kidney lower pole are suggestive for small cysts. Right extrarenal pelvis is unchanged without right hydronephrosis. Again noted is a exophytic cyst in the left kidney lower pole. There is a new area of decreased attenuation along the posterior left kidney lower pole region on sequence 4, image 50. The central aspect of the low-density area is hyperdense and may be enhancing. Based on the sagittal reformats, this could represent a renal infarct or focal pyelonephritis. Difficult to exclude an atypical lesion at this location. Negative for left hydronephrosis. Normal appearance of the urinary bladder. Indeterminate low-density structure in the medial left kidney on sequence 9 image 27 measures up to 1.0 cm. Stomach/Bowel: Diverticulosis in the sigmoid colon without colonic inflammation. Normal appearance stomach. No evidence for small or large bowel struck shin. Calcification near the distal common bile duct is likely at the ampulla. 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 Main visceral arteries are patent. No abdominopelvic lymphadenopathy. Portal venous system is patent. Splenic vein is patent. Reproductive: Hysterectomy. New or enlarged low-density structure involving the right ovary on sequence 4, image 71. No evidence for a left adnexal lesion. Other: Large amount of edema in the central  abdominal mesentery and around the duodenum and porta hepatis. No ascites in pelvis. Negative free air. Musculoskeletal: Focal sclerosis in the left ilium on sequence 4, image 69 is stable. No acute bone abnormality. Review of the MIP images confirms the above findings. IMPRESSION: 1. Acute pancreatitis likely secondary to an obstructive stone in the distal common bile duct near the ampulla. 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 No evidence for pseudocyst formations at time. Mild intrahepatic and extrahepatic biliary dilatation. Subtle low-density along the posterior right hepatic lobe may be related to inflammation but this area is indeterminate. 2. Indeterminate left renal lesions. Unusual lesion or hypodensities in the posterior left kidney lower pole could represent areas of infarct, focal pyelonephritis or atypical neoplastic lesion. There is also a new indeterminate 1 cm lesion in the medial left kidney. Recommend follow-up renal MRI when patient is stable and can not tolerate an abdominal MRI. 3. Negative for pulmonary embolism.  Volume loss at the lung bases. 4. Chronic small pulmonary nodules. Index pulmonary nodule in right middle lobe has not significantly changed since 2017. 5. **An incidental finding of potential clinical significance has been found. Mildly enlarged right axillary lymph nodes of uncertain etiology. These lymph nodes are small but have enlarged since 2017.** 6. **An incidental finding of potential clinical significance has been found. 2.5 cm low-density structure in the right ovary. This could represent an ovarian or adnexal cyst but atypical for a patient of this age. Recommend follow-up pelvic ultrasound.** These results were called by telephone at the time of interpretation on 12/13/2019 at 4:25 pm to provider [facility] , who verbally acknowledged these results. Electronically Signed  By: Markus Daft M.D.   On: 12/13/2019 16:35   CT ABDOMEN PELVIS  W CONTRAST  Result Date: 12/13/2019 CLINICAL DATA:  74 year old female with lower abdominal pain. Shortness of breath. EXAM: CT ANGIOGRAPHY CHEST CT ABDOMEN AND PELVIS WITH CONTRAST TECHNIQUE: Multidetector CT imaging of the chest was performed using the standard protocol during bolus administration of intravenous contrast. Multiplanar CT image reconstructions and MIPs were obtained to evaluate the vascular anatomy. Multidetector CT imaging of the abdomen and pelvis was performed using the standard protocol during bolus administration of intravenous contrast. CONTRAST:  90mL OMNIPAQUE IOHEXOL 350 MG/ML SOLN COMPARISON:  Chest CT 01/11/2016 and CT abdomen 12/02/2013 FINDINGS: CTA CHEST FINDINGS Cardiovascular: Negative for pulmonary embolism. Normal caliber of the thoracic aorta. Heart size is within normal limits. No significant pericardial fluid. Great vessels patent. Mediastinum/Nodes: Normal appearance of mediastinal structures. No significant node enlargement in the mediastinum or hilar regions. There are small right axillary lymph nodes. However, these right axillary lymph nodes have clearly enlarged from the exam in 2017. Index lymph node measures up to 8 mm on sequence 1, image 21 and this lymph node was barely perceptible on the prior examination. Mild lymphadenopathy in the right axilla is nonspecific. No significant left axillary lymph node enlargement. Lungs/Pleura: Trachea and mainstem bronchi are patent. Dependent atelectasis in both lower lobes. Again noted are small scattered pulmonary nodules. Index lesion in the right middle lobe on sequence 4, image 46 measures 4 mm and stable. There are additional small scattered pulmonary nodules predominantly along the pleural surfaces. Majority of these small pulmonary nodules are stable. Limited evaluation of the lung bases due to the atelectasis. No large pleural effusions. Question a new 2 mm nodule in the periphery of the right lower lobe on sequence 4,  image 51. Musculoskeletal: No acute bone abnormality. Review of the MIP images confirms the above findings. CT ABDOMEN and PELVIS FINDINGS Hepatobiliary: Diffuse low-attenuation of the liver is compatible with steatosis. Gallbladder has been removed. Subtle low-density along periphery of the posterior right hepatic lobe on sequence 4, image 20 is nonspecific. There is mild intrahepatic biliary dilatation. Mild dilatation of the common bile duct measuring up to 9 mm on sequence 4, image 38. Large amount of edema and stranding in the porta hepatis. There is a calcification near the distal common bile duct and this is compatible with an obstructing bile duct stone. Pancreas: Extensive edema and inflammatory changes in the upper abdomen centered around the pancreas. Pancreatic enhancement is heterogeneous and compatible with edema and possibly areas of pancreatic necrosis. No discrete fluid or pseudocyst formations. No significant pancreatic duct dilatation. Spleen: Normal in size without focal abnormality. Adrenals/Urinary Tract: Normal adrenal glands. Stable appearance of the right kidney. Tiny hypodensities along the right kidney lower pole are suggestive for small cysts. Right extrarenal pelvis is unchanged without right hydronephrosis. Again noted is a exophytic cyst in the left kidney lower pole. There is a new area of decreased attenuation along the posterior left kidney lower pole region on sequence 4, image 50. The central aspect of the low-density area is hyperdense and may be enhancing. Based on the sagittal reformats, this could represent a renal infarct or focal pyelonephritis. Difficult to exclude an atypical lesion at this location. Negative for left hydronephrosis. Normal appearance of the urinary bladder. Indeterminate low-density structure in the medial left kidney on sequence 9 image 27 measures up to 1.0 cm. Stomach/Bowel: Diverticulosis in the sigmoid colon without colonic inflammation. Normal  appearance stomach. No  evidence for small or large bowel struck shin. Calcification near the distal common bile duct is likely at the ampulla. 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 Main visceral arteries are patent. No abdominopelvic lymphadenopathy. Portal venous system is patent. Splenic vein is patent. Reproductive: Hysterectomy. New or enlarged low-density structure involving the right ovary on sequence 4, image 71. No evidence for a left adnexal lesion. Other: Large amount of edema in the central abdominal mesentery and around the duodenum and porta hepatis. No ascites in pelvis. Negative free air. Musculoskeletal: Focal sclerosis in the left ilium on sequence 4, image 69 is stable. No acute bone abnormality. Review of the MIP images confirms the above findings. IMPRESSION: 1. Acute pancreatitis likely secondary to an obstructive stone in the distal common bile duct near the ampulla. 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 No evidence for pseudocyst formations at time. Mild intrahepatic and extrahepatic biliary dilatation. Subtle low-density along the posterior right hepatic lobe may be related to inflammation but this area is indeterminate. 2. Indeterminate left renal lesions. Unusual lesion or hypodensities in the posterior left kidney lower pole could represent areas of infarct, focal pyelonephritis or atypical neoplastic lesion. There is also a new indeterminate 1 cm lesion in the medial left kidney. Recommend follow-up renal MRI when patient is stable and can not tolerate an abdominal MRI. 3. Negative for pulmonary embolism.  Volume loss at the lung bases. 4. Chronic small pulmonary nodules. Index pulmonary nodule in right middle lobe has not significantly changed since 2017. 5. **An incidental finding of potential clinical significance has been found. Mildly enlarged right axillary lymph nodes of uncertain etiology. These lymph  nodes are small but have enlarged since 2017.** 6. **An incidental finding of potential clinical significance has been found. 2.5 cm low-density structure in the right ovary. This could represent an ovarian or adnexal cyst but atypical for a patient of this age. Recommend follow-up pelvic ultrasound.** These results were called by telephone at the time of interpretation on 12/13/2019 at 4:25 pm to provider [facility] , who verbally acknowledged these results. Electronically Signed   By: Markus Daft M.D.   On: 12/13/2019 16:35    ____________________________________________   PROCEDURES  Procedure(s) performed (including Critical Care):  .Critical Care Performed by: Vanessa Clifton, MD Authorized by: Vanessa , MD   Critical care provider statement:    Critical care time (minutes):  31   Critical care was necessary to treat or prevent imminent or life-threatening deterioration of the following conditions:  Sepsis   Critical care was time spent personally by me on the following activities:  Discussions with consultants, evaluation of patient's response to treatment, examination of patient, ordering and performing treatments and interventions, ordering and review of laboratory studies, ordering and review of radiographic studies, pulse oximetry, re-evaluation of patient's condition, obtaining history from patient or surrogate and review of old charts     ____________________________________________   INITIAL IMPRESSION / ASSESSMENT AND PLAN / ED COURSE  Katherine Oliver was evaluated in Emergency Department on 12/13/2019 for the symptoms described in the history of present illness. She was evaluated in the context of the global COVID-19 pandemic, which necessitated consideration that the patient might be at risk for infection with the SARS-CoV-2 virus that causes COVID-19. Institutional protocols and algorithms that pertain to the evaluation of patients at risk for COVID-19 are in a  state of rapid change based on information released by regulatory bodies including the CDC  and federal and state organizations. These policies and algorithms were followed during the patient's care in the ED.     Patient presents with severe abdominal pain.  In the setting of elevated white count.  Will get CT scan to evaluate for perforation, abscess, diverticulitis.  Given patient was hypoxic to 92% we will also get a CT PE to make sure is no evidence of pulmonary embolism.  Will get Covid test.  Patient denies feeling short of breath.  Labs consistent with elevated lipase and T bili with elevated white count.  CT scan consistent with gallstone pancreatitis.  Also some incidental findings I did describe to the husband and stated that they can have these followed up outpatient.  Discussed with Dr. Allen Norris who recommends admission to [facility] due to not having ERCP capabilities today or tomorrow  Discussed with the secretary who stated that [facility] stated that the patient needed to be admitted by our hospitalist and then they can admit him to [facility].  I discussed with them and they said that there are better beds over there so they should get a bed today.  Discussed with the admitting doctor here ____________________________________________   FINAL CLINICAL IMPRESSION(S) / ED DIAGNOSES   Final diagnoses:  Gallstone pancreatitis  Sepsis, due to unspecified organism, unspecified whether acute organ dysfunction present [facility])      MEDICATIONS GIVEN DURING THIS VISIT:  Medications  piperacillin-tazobactam (ZOSYN) IVPB 3.375 g (3.375 g Intravenous New Bag/Given 12/13/19 1644)  HYDROmorphone (DILAUDID) injection 0.5 mg (0.5 mg Intravenous Given 12/13/19 1502)  ondansetron (ZOFRAN) injection 4 mg (4 mg Intravenous Given 12/13/19 1501)  sodium chloride 0.9 % bolus 1,000 mL (0 mLs Intravenous Stopped 12/13/19 1645)  iohexol (OMNIPAQUE) 350 MG/ML injection 75 mL (75 mLs Intravenous Contrast Given  12/13/19 1534)  HYDROmorphone (DILAUDID) injection 0.5 mg (0.5 mg Intravenous Given 12/13/19 1644)     ED Discharge Orders    None       Note:  This document was prepared using Dragon voice recognition software and may include unintentional dictation errors.   Vanessa Hudson, MD 12/13/19 1709    Vanessa Coats, MD 12/13/19 [phone]

## 2019-12-13 NOTE — H&P (Addendum)
TRH H&P   Patient Demographics:    Katherine Oliver, is a 74 y.o. female  MRN: [id number]   DOB - 19-Sep-1946  Admit Date - 12/13/2019  Outpatient Primary MD for the patient is [name], MD  Referring MD: Dr. Nickolas Madrid  Outpatient Specialists: urology  Patient coming from: Home  Chief Complaint  Patient presents with  . Abdominal Pain      HPI:    Katherine Oliver  is a 74 y.o. female, with history of hypertension, diabetes mellitus on Metformin, chronic kidney disease stage IIIa, osteoarthritis, recurrent UTIs who was seen by her urologist yesterday for dysuria, urgency frequency and chills and started on Macrobid with concern for UTI.  (Her UA was positive in the office). This morning around 11 AM patient started having severe diffuse abdominal pain, crampy in nature associated with few episodes of watery diarrhea and almost 7 episodes of vomiting (initially of food and then bilious).  Patient reports having similar episode about a month back of abdominal pain and vomiting that lasted for few hours but self subsided.  Reports having her gallbladder out several years back. She denies any fevers, had some chills, denies hematemesis or melena, chest pain, palpitations, shortness of breath, abdominal distention, hematuria.  Reports her dysuria to be better after being on antibiotic since yesterday.  Denies any other new medication besides the antibiotic.  Course in the ED patient was found to be septic with elevated blood pressure 176/109 mmHg, tachycardic in the low 100s, afebrile.  Blood work showed significant leukocytosis of WBC of 26K, BUN of 26 creatinine of 1.09, blood glucose of 219 transaminitis with total bili of 2, AST of 194 and ALT of 120, alk phos of 68. Lactic acid was markedly elevated at 4.4 and lipase of 5200.  A CT angiogram of the chest was negative for PE  while CT abdomen pelvis with contrast showed extensive edema inflammatory changes in the peripancreatic area with possible pancreatic necrosis.  No fluid or cyst or cyst formation.  There was an obstructing bile duct stone near the ampulla with intra and extrahepatic biliary dilatation.  No gastroenterology coverage is available for ERCP at [facility] at present. Hospitalist consulted for admission and transfer to [facility] for GI evaluation.  I spoke with GI Dr. Benson Norway at [facility] and after reviewing the case agreed to have patient transferred to [facility] and will put her on schedule for ERCP tomorrow.  In the ED patient received IV Zofran and IV Dilaudid with minimal improvement in her pain.  She has not had any vomiting or diarrhea since he arrived to the hospital.    Review of systems:    In addition to the HPI above,  No Fever, chills + No Headache, No changes with Vision or hearing, No problems swallowing food or Liquids, No Chest pain, Cough or Shortness of  Breath, Diffuse abdominal pain+++, nausea, vomiting ++ +, diarrhea ++ No Blood in stool or Urine, Dysuria + No new skin rashes or bruises, No new joints pains-aches,  Generalized weakness, tingling, numbness in any extremity, No recent weight gain or loss, No polyuria, polydypsia or polyphagia, No significant Mental Stressors.    With Past History of the following :    Past Medical History:  Diagnosis Date  . Allergic rhinitis   . Arthritis   . 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 nephropathy ([place])    DSME at [facility] 01/2016   . Frequent epistaxis 05/16/2019   S/p cauterization with resolution 2020  . GERD (gastroesophageal reflux disease)   . Hepatic steatosis    by abd [place] 05/2012, mild transaminitis - normal iron sat and viral hep panel (2011), stable [place] 2017  .  History of chicken pox   . History of measles   . History of recurrent UTIs    on chronic keflex  . HLD (hyperlipidemia)   . HTN (hypertension)   . 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 Kidney cyst, acquired 01/2016   L kidney by [place]  . Kidney stone 01/2016   L kidney by [place]  . Lung nodules 11/2013   overall stable on f/u CT 01/2016  . Osteopenia 06/2013   mild, forearm T -1.1, hip and spine WNL  . Polycythemia    mild, stable (2013)  . Primary localized osteoarthritis of right knee 01/05/2019  . Rosacea    metrogel      Past Surgical History:  Procedure Laterality Date  . APPENDECTOMY  1987  . BREAST BIOPSY Right 1963   benign  . BREAST BIOPSY Right 08/2014   benign- core  . cardiolite stress test  04/2004   normal  . CESAREAN SECTION  [id number];9381   x2  . CHOLECYSTECTOMY  2003  . COLONOSCOPY  09/26/2008   adenomatous polyp, rpt 3-5 yrs  . COLONOSCOPY  08/2012   adenomatous polyps, diverticulosis, rec rpt 5 yrs [name])  . COLONOSCOPY WITH PROPOFOL N/A 02/05/2018   4TA, SSA, diverticulosis, rpt 3 yrs [name], [name], MD)  . dexa  2003   normal  . dexa  06/2013   [facility] - Tscore -1.1 forearm, normal spine and femur  . 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 TRANSTHORACIC ECHOCARDIOGRAM  04/2019   EF 55-60%, modLVH, impaired relaxation   . TRIGGER FINGER RELEASE  2007;2010;2011   bilateral  . TRIGGER FINGER RELEASE  02/2017  . VAGINAL HYSTERECTOMY  1984   for menorrhagia, ovaries in place      Social History:     Social History   Tobacco Use  . Smoking status: Never Smoker  . Smokeless tobacco: Never Used  Substance Use Topics  . Alcohol use: Not Currently    Alcohol/week: 1.0 - 2.0 standard drinks    Types: 1 - 2 Glasses of wine per week    Comment: Occasional/1 mixed drink per month     Lives  -home  Mobility -independent     Family History :     Family History  Problem Relation Age of Onset  . Stroke Mother  several  . Hyperlipidemia Mother   . Hypertension Mother   . Cancer Father        colon  . Hypertension Father   . Hyperlipidemia Father   . Coronary artery disease Father 83       MIx1, CABG  . Cancer Paternal Aunt        abdominal  . Coronary artery disease Maternal Grandmother   . Diabetes Maternal Grandfather   . Coronary artery disease Maternal Grandfather   . Breast cancer Neg Hx       Home Medications:   Prior to Admission medications   Medication Sig Start Date End Date Taking? Authorizing Provider  acetaminophen (TYLENOL) 500 MG tablet Take 1,000 mg by mouth every 6 (six) hours as needed for moderate pain.     [provider]  amLODipine (NORVASC) 10 MG tablet Take 1 tablet (10 mg total) by mouth daily. 08/26/19   [name], MD  benazepril (LOTENSIN) 20 MG tablet Take 1 tablet (20 mg total) by mouth daily. 08/26/19   [name], MD  Ca Phosphate-Cholecalciferol (CALCIUM/VITAMIN D3 GUMMIES PO) Take by mouth daily.    [provider]  Cholecalciferol (VITAMIN D3) 25 MCG (1000 UT) CAPS Take 1 capsule (1,000 Units total) by mouth daily. 08/26/19   [name], MD  conjugated estrogens (PREMARIN) vaginal cream Place 1 Applicatorful vaginally every other day. 08/25/19   Billey Co, MD  Cranberry 500 MG CAPS Take 1 capsule by mouth 3 (three) times daily.    [provider]  diphenhydrAMINE (BENADRYL) 25 MG tablet Take 25 mg by mouth 2 (two) times daily as needed for allergies.     [provider]  fenofibrate 160 MG tablet Take 1 tablet (160 mg total) by mouth daily. 08/26/19   [name], MD  fluticasone [facility]) 50 MCG/ACT nasal spray USE 2 SPRAYS INTO BOTH  NOSTRILS DAILY AS NEEDED 08/26/19   [name], MD  glucose blood (ACCU-CHEK AVIVA PLUS) test strip USE AS  DIRECTED TO CHECK BLOOD SUGARS ONCE DAILY E11.21 08/26/19   [name], MD  hydrochlorothiazide (MICROZIDE) 12.5 MG capsule Take 1 capsule (12.5 mg total) by mouth daily. 08/26/19   [name], MD  Javier Docker Oil Omega-3 500 MG CAPS Take 500 mg by mouth daily.     [provider]  Lancets (ACCU-CHEK SOFT TOUCH) lancets Use as instructed to check sugars once daily E11.21 08/26/19   [name], MD  metFORMIN (GLUCOPHAGE-XR) 500 MG 24 hr tablet Take 1 tablet (500 mg total) by mouth daily with breakfast. 08/26/19   [name], MD  Multiple Vitamins-Minerals (CENTRUM SILVER PO) Take 1 tablet by mouth daily.    [provider]  nitrofurantoin, macrocrystal-monohydrate, (MACROBID) 100 MG capsule Take 1 capsule (100 mg total) by mouth every 12 (twelve) hours for 5 days. 12/12/19 12/17/19  Debroah Loop, PA-C  nitrofurantoin, macrocrystal-monohydrate, (MACROBID) 100 MG capsule Take 1 capsule (100 mg total) by mouth daily. 12/18/19 06/15/20  Debroah Loop, PA-C  Omega-3 Fatty Acids (FISH OIL) 1000 MG CAPS Take by mouth.    [provider]  oxymetazoline (AFRIN) 0.05 % nasal spray Place 2 sprays into both nostrils 2 (two) times daily.    [provider]  rosuvastatin (CRESTOR) 10 MG tablet Take 1 tablet (10 mg total) by mouth at bedtime. 08/26/19   [name], MD     Allergies:     Allergies  Allergen Reactions  . Azithromycin Itching    Okay if takes benadryl  along with it  . Nickel     Reaction to cheap earrings  . Sulfa Antibiotics   . Adhesive [Tape] Rash    Paper tape - blisters     Physical Exam:   Vitals  Blood pressure (!) 178/105, pulse (!) 107, temperature 97.8 F (36.6 C), temperature source Oral, resp. rate 18, SpO2 91 %.   General: Elderly obese female lying in bed in some distress with pain HEENT: Pupils reactive bilaterally, EOMI, no pallor, no icterus, dry mucosa, supple neck, no cervical pain  with Chest: Clear to auscultation bilateral, no added sound CVs: Normal S1-S2, no murmur rub or gallop GI: Soft, mildly distended, diffuse tenderness, more prominent over the epigastric and upper quadrants, bowel sounds present Musculoskeletal: Warm, no edema CNS: Alert and oriented, nonfocal   Data Review:    CBC Recent Labs  Lab 12/13/19 1400  WBC 26.4*  HGB 16.2*  HCT 51.2*  PLT 356  MCV 84.6  MCH 26.8  MCHC 31.6  RDW 13.9   ------------------------------------------------------------------------------------------------------------------  Chemistries  Recent Labs  Lab 12/13/19 1400  NA 140  K 3.6  CL 101  CO2 22  GLUCOSE 219*  BUN 26*  CREATININE 1.09*  CALCIUM 10.3  AST 194*  ALT 120*  ALKPHOS 68  BILITOT 2.0*   ------------------------------------------------------------------------------------------------------------------ estimated creatinine clearance is 42 mL/min (A) (by C-G formula based on SCr of 1.09 mg/dL (H)). ------------------------------------------------------------------------------------------------------------------ No results for input(s): TSH, T4TOTAL, T3FREE, THYROIDAB in the last 72 hours.  Invalid input(s): FREET3  Coagulation profile No results for input(s): INR, PROTIME in the last 168 hours. ------------------------------------------------------------------------------------------------------------------- No results for input(s): DDIMER in the last 72 hours. -------------------------------------------------------------------------------------------------------------------  Cardiac Enzymes No results for input(s): CKMB, TROPONINI, MYOGLOBIN in the last 168 hours.  Invalid input(s): CK ------------------------------------------------------------------------------------------------------------------ No results found for:  BNP   ---------------------------------------------------------------------------------------------------------------  Urinalysis    Component Value Date/Time   COLORURINE AMBER (A) 12/13/2019 1504   APPEARANCEUR CLEAR (A) 12/13/2019 1504   APPEARANCEUR Cloudy (A) 12/12/2019 1332   LABSPEC 1.021 12/13/2019 1504   PHURINE 5.0 12/13/2019 1504   GLUCOSEU 50 (A) 12/13/2019 1504   HGBUR NEGATIVE 12/13/2019 1504   BILIRUBINUR NEGATIVE 12/13/2019 1504   BILIRUBINUR Negative 12/12/2019 1332   KETONESUR NEGATIVE 12/13/2019 1504   PROTEINUR NEGATIVE 12/13/2019 1504   UROBILINOGEN 0.2 02/12/2018 0805   NITRITE POSITIVE (A) 12/13/2019 1504   LEUKOCYTESUR NEGATIVE 12/13/2019 1504    ----------------------------------------------------------------------------------------------------------------   Imaging Results:    CT Angio Chest PE W and/or Wo Contrast  Result Date: 12/13/2019 CLINICAL DATA:  74 year old female with lower abdominal pain. Shortness of breath. EXAM: CT ANGIOGRAPHY CHEST CT ABDOMEN AND PELVIS WITH CONTRAST TECHNIQUE: Multidetector CT imaging of the chest was performed using the standard protocol during bolus administration of intravenous contrast. Multiplanar CT image reconstructions and MIPs were obtained to evaluate the vascular anatomy. Multidetector CT imaging of the abdomen and pelvis was performed using the standard protocol during bolus administration of intravenous contrast. CONTRAST:  18m OMNIPAQUE IOHEXOL 350 MG/ML SOLN COMPARISON:  Chest CT 01/11/2016 and CT abdomen 12/02/2013 FINDINGS: CTA CHEST FINDINGS Cardiovascular: Negative for pulmonary embolism. Normal caliber of the thoracic aorta. Heart size is within normal limits. No significant pericardial fluid. Great vessels patent. Mediastinum/Nodes: Normal appearance of mediastinal structures. No significant node enlargement in the mediastinum or hilar regions. There are small right axillary lymph nodes. However, these  right axillary lymph nodes have clearly enlarged from the exam in 2017. Index lymph node measures up to 8 mm  on sequence 1, image 21 and this lymph node was barely perceptible on the prior examination. Mild lymphadenopathy in the right axilla is nonspecific. No significant left axillary lymph node enlargement. Lungs/Pleura: Trachea and mainstem bronchi are patent. Dependent atelectasis in both lower lobes. Again noted are small scattered pulmonary nodules. Index lesion in the right middle lobe on sequence 4, image 46 measures 4 mm and stable. There are additional small scattered pulmonary nodules predominantly along the pleural surfaces. Majority of these small pulmonary nodules are stable. Limited evaluation of the lung bases due to the atelectasis. No large pleural effusions. Question a new 2 mm nodule in the periphery of the right lower lobe on sequence 4, image 51. Musculoskeletal: No acute bone abnormality. Review of the MIP images confirms the above findings. CT ABDOMEN and PELVIS FINDINGS Hepatobiliary: Diffuse low-attenuation of the liver is compatible with steatosis. Gallbladder has been removed. Subtle low-density along periphery of the posterior right hepatic lobe on sequence 4, image 20 is nonspecific. There is mild intrahepatic biliary dilatation. Mild dilatation of the common bile duct measuring up to 9 mm on sequence 4, image 38. Large amount of edema and stranding in the porta hepatis. There is a calcification near the distal common bile duct and this is compatible with an obstructing bile duct stone. Pancreas: Extensive edema and inflammatory changes in the upper abdomen centered around the pancreas. Pancreatic enhancement is heterogeneous and compatible with edema and possibly areas of pancreatic necrosis. No discrete fluid or pseudocyst formations. No significant pancreatic duct dilatation. Spleen: Normal in size without focal abnormality. Adrenals/Urinary Tract: Normal adrenal glands. Stable  appearance of the right kidney. Tiny hypodensities along the right kidney lower pole are suggestive for small cysts. Right extrarenal pelvis is unchanged without right hydronephrosis. Again noted is a exophytic cyst in the left kidney lower pole. There is a new area of decreased attenuation along the posterior left kidney lower pole region on sequence 4, image 50. The central aspect of the low-density area is hyperdense and may be enhancing. Based on the sagittal reformats, this could represent a renal infarct or focal pyelonephritis. Difficult to exclude an atypical lesion at this location. Negative for left hydronephrosis. Normal appearance of the urinary bladder. Indeterminate low-density structure in the medial left kidney on sequence 9 image 27 measures up to 1.0 cm. Stomach/Bowel: Diverticulosis in the sigmoid colon without colonic inflammation. Normal appearance stomach. No evidence for small or large bowel struck shin. Calcification near the distal common bile duct is likely at the ampulla. 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 Main visceral arteries are patent. No abdominopelvic lymphadenopathy. Portal venous system is patent. Splenic vein is patent. Reproductive: Hysterectomy. New or enlarged low-density structure involving the right ovary on sequence 4, image 71. No evidence for a left adnexal lesion. Other: Large amount of edema in the central abdominal mesentery and around the duodenum and porta hepatis. No ascites in pelvis. Negative free air. Musculoskeletal: Focal sclerosis in the left ilium on sequence 4, image 69 is stable. No acute bone abnormality. Review of the MIP images confirms the above findings. IMPRESSION: 1. Acute pancreatitis likely secondary to an obstructive stone in the distal common bile duct near the ampulla. 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 No evidence for pseudocyst formations at time. Mild  intrahepatic and extrahepatic biliary dilatation. Subtle low-density along the posterior right hepatic lobe may be related to inflammation but this area is indeterminate. 2. Indeterminate left renal lesions. Unusual lesion  or hypodensities in the posterior left kidney lower pole could represent areas of infarct, focal pyelonephritis or atypical neoplastic lesion. There is also a new indeterminate 1 cm lesion in the medial left kidney. Recommend follow-up renal MRI when patient is stable and can not tolerate an abdominal MRI. 3. Negative for pulmonary embolism.  Volume loss at the lung bases. 4. Chronic small pulmonary nodules. Index pulmonary nodule in right middle lobe has not significantly changed since 2017. 5. **An incidental finding of potential clinical significance has been found. Mildly enlarged right axillary lymph nodes of uncertain etiology. These lymph nodes are small but have enlarged since 2017.** 6. **An incidental finding of potential clinical significance has been found. 2.5 cm low-density structure in the right ovary. This could represent an ovarian or adnexal cyst but atypical for a patient of this age. Recommend follow-up pelvic ultrasound.** These results were called by telephone at the time of interpretation on 12/13/2019 at 4:25 pm to provider [facility] , who verbally acknowledged these results. Electronically Signed   By: Markus Daft M.D.   On: 12/13/2019 16:35   CT ABDOMEN PELVIS W CONTRAST  Result Date: 12/13/2019 CLINICAL DATA:  74 year old female with lower abdominal pain. Shortness of breath. EXAM: CT ANGIOGRAPHY CHEST CT ABDOMEN AND PELVIS WITH CONTRAST TECHNIQUE: Multidetector CT imaging of the chest was performed using the standard protocol during bolus administration of intravenous contrast. Multiplanar CT image reconstructions and MIPs were obtained to evaluate the vascular anatomy. Multidetector CT imaging of the abdomen and pelvis was performed using the standard protocol during  bolus administration of intravenous contrast. CONTRAST:  19m OMNIPAQUE IOHEXOL 350 MG/ML SOLN COMPARISON:  Chest CT 01/11/2016 and CT abdomen 12/02/2013 FINDINGS: CTA CHEST FINDINGS Cardiovascular: Negative for pulmonary embolism. Normal caliber of the thoracic aorta. Heart size is within normal limits. No significant pericardial fluid. Great vessels patent. Mediastinum/Nodes: Normal appearance of mediastinal structures. No significant node enlargement in the mediastinum or hilar regions. There are small right axillary lymph nodes. However, these right axillary lymph nodes have clearly enlarged from the exam in 2017. Index lymph node measures up to 8 mm on sequence 1, image 21 and this lymph node was barely perceptible on the prior examination. Mild lymphadenopathy in the right axilla is nonspecific. No significant left axillary lymph node enlargement. Lungs/Pleura: Trachea and mainstem bronchi are patent. Dependent atelectasis in both lower lobes. Again noted are small scattered pulmonary nodules. Index lesion in the right middle lobe on sequence 4, image 46 measures 4 mm and stable. There are additional small scattered pulmonary nodules predominantly along the pleural surfaces. Majority of these small pulmonary nodules are stable. Limited evaluation of the lung bases due to the atelectasis. No large pleural effusions. Question a new 2 mm nodule in the periphery of the right lower lobe on sequence 4, image 51. Musculoskeletal: No acute bone abnormality. Review of the MIP images confirms the above findings. CT ABDOMEN and PELVIS FINDINGS Hepatobiliary: Diffuse low-attenuation of the liver is compatible with steatosis. Gallbladder has been removed. Subtle low-density along periphery of the posterior right hepatic lobe on sequence 4, image 20 is nonspecific. There is mild intrahepatic biliary dilatation. Mild dilatation of the common bile duct measuring up to 9 mm on sequence 4, image 38. Large amount of edema and  stranding in the porta hepatis. There is a calcification near the distal common bile duct and this is compatible with an obstructing bile duct stone. Pancreas: Extensive edema and inflammatory changes in the upper abdomen centered  around the pancreas. Pancreatic enhancement is heterogeneous and compatible with edema and possibly areas of pancreatic necrosis. No discrete fluid or pseudocyst formations. No significant pancreatic duct dilatation. Spleen: Normal in size without focal abnormality. Adrenals/Urinary Tract: Normal adrenal glands. Stable appearance of the right kidney. Tiny hypodensities along the right kidney lower pole are suggestive for small cysts. Right extrarenal pelvis is unchanged without right hydronephrosis. Again noted is a exophytic cyst in the left kidney lower pole. There is a new area of decreased attenuation along the posterior left kidney lower pole region on sequence 4, image 50. The central aspect of the low-density area is hyperdense and may be enhancing. Based on the sagittal reformats, this could represent a renal infarct or focal pyelonephritis. Difficult to exclude an atypical lesion at this location. Negative for left hydronephrosis. Normal appearance of the urinary bladder. Indeterminate low-density structure in the medial left kidney on sequence 9 image 27 measures up to 1.0 cm. Stomach/Bowel: Diverticulosis in the sigmoid colon without colonic inflammation. Normal appearance stomach. No evidence for small or large bowel struck shin. Calcification near the distal common bile duct is likely at the ampulla. 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 Main visceral arteries are patent. No abdominopelvic lymphadenopathy. Portal venous system is patent. Splenic vein is patent. Reproductive: Hysterectomy. New or enlarged low-density structure involving the right ovary on sequence 4, image 71. No evidence for a left adnexal lesion. Other: Large amount  of edema in the central abdominal mesentery and around the duodenum and porta hepatis. No ascites in pelvis. Negative free air. Musculoskeletal: Focal sclerosis in the left ilium on sequence 4, image 69 is stable. No acute bone abnormality. Review of the MIP images confirms the above findings. IMPRESSION: 1. Acute pancreatitis likely secondary to an obstructive stone in the distal common bile duct near the ampulla. 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 No evidence for pseudocyst formations at time. Mild intrahepatic and extrahepatic biliary dilatation. Subtle low-density along the posterior right hepatic lobe may be related to inflammation but this area is indeterminate. 2. Indeterminate left renal lesions. Unusual lesion or hypodensities in the posterior left kidney lower pole could represent areas of infarct, focal pyelonephritis or atypical neoplastic lesion. There is also a new indeterminate 1 cm lesion in the medial left kidney. Recommend follow-up renal MRI when patient is stable and can not tolerate an abdominal MRI. 3. Negative for pulmonary embolism.  Volume loss at the lung bases. 4. Chronic small pulmonary nodules. Index pulmonary nodule in right middle lobe has not significantly changed since 2017. 5. **An incidental finding of potential clinical significance has been found. Mildly enlarged right axillary lymph nodes of uncertain etiology. These lymph nodes are small but have enlarged since 2017.** 6. **An incidental finding of potential clinical significance has been found. 2.5 cm low-density structure in the right ovary. This could represent an ovarian or adnexal cyst but atypical for a patient of this age. Recommend follow-up pelvic ultrasound.** These results were called by telephone at the time of interpretation on 12/13/2019 at 4:25 pm to provider [facility] , who verbally acknowledged these results. Electronically Signed   By: Markus Daft M.D.   On: 12/13/2019  16:35    My personal review of EKG:   Assessment & Plan:    Principal Problem: Severe sepsis ([place]) Acute gallstone pancreatitis Transfer to  [facility] under progressive care.  Strict n.p.o. IV normal saline bolus followed by maintenance normal saline at 150  cc/h.  Serial abdominal exam.  Monitor electrolytes, LFTs and lipase.  Serial abdominal exam.  Empiric IV Zosyn for sepsis and possible cholangitis..  Blood culture sent from the ED.  Case discussed with Dr. Benson Norway (GI) who will see the patient in the morning and plans to schedule her for ERCP tomorrow.   Active Problems: Essential hypertension Home p.o. meds held and placed on as needed IV hydralazine.    History of recurrent UTIs Saw her urologist yesterday for dysuria and was placed on Macrobid.  Patient currently on empiric IV Zosyn for sepsis.    NAFLD (nonalcoholic fatty liver disease) Monitor LFTs.  Dyslipidemia Hold statin given worsened LFTs.    GERD (gastroesophageal reflux disease) IV Protonix.    Obesity, Class I, BMI 30-34.9    CKD stage 3 due to type 2 diabetes mellitus ([place]) Renal function stable.  Patient on Metformin which is held.  Monitor with every 4 hours CBG on sliding scale coverage as patient is n.p.o.    Primary localized osteoarthritis of right knee       DVT Prophylaxis subcu Lovenox  AM Labs Ordered, also please review Full Orders  Family Communication: Admission, patients condition and plan of care including tests being ordered have been discussed with the patient in detail at bedside  Code Status full code  Transfer to [facility]  Condition GUARDED   Consults called: Dr. Benson Norway (GI)  Admission status: Inpatient Patient presenting with severe sepsis with acute gallstone pancreatitis.  She is at high risk of having acute severe complication including septic shock, necrotizing pancreatitis and peritonitis.  Patient needs to be monitored in an inpatient setting and be  transferred to tertiary center for GI evaluation and ERCP for stone extraction.  She needs to be monitored with aggressive IV hydration, GI intervention and will require at least >2 midnight in the hospital.  Time spent in minutes 70   Michio Thier M.D on 12/13/2019 at 5:28 PM  Between 7am to 7pm - Pager - [phone]. After 7pm go to [email or web address] - password [facility]  [facility] - Office  [phone]

## 2019-12-13 NOTE — ED Triage Notes (Signed)
Per ems--abdominal pain with nausea/vomiting and diarrhea started 11am today.  Dx uti yesterday and started on macrobid.  bp 174/90 xo low 90s.  no short of breath.  otherwise vss.

## 2019-12-13 NOTE — Discharge Summary (Signed)
  Please refer to admission H&P from earlier this evening for details.  74 year old female with severe sepsis secondary to obstructive gallstone pancreatitis. GI consulted but no gastroenterology coverage to perform ERCP at [facility] at present.  Recommends transfer to [facility].  Spoke with GI Dr. Benson Norway at [facility] who has reviewed the patient's chart and imaging and agrees on patient being transferred to [facility].  He plans to put her on schedule for ERCP for tomorrow.  [facility] called and patient will be transferred to [facility] to be admitted under progressive care unit.  Vitals stable except for elevated blood pressure.  Patient maintaining sats on 2 L via nasal cannula in the high 90s.  Afebrile. Physical exam unchanged from admission note.  Medications upon discharge IV normal saline @ 150 cc/h IV Dilaudid 1 mg every 3 hours as needed for pain IV Zosyn every 8 hours IV Zofran 4 mg q. 4-6 hours as needed IV Protonix 40 mg every 24 hours. IV hydralazine 10 mg every 6 hour as needed for elevated blood pressure Sensitive sliding scale insulin coverage  Home p.o. blood pressure medications, Metformin and statin held.  Please keep patient n.p.o., serial abdominal exam and ensure labs ordered for a.m.

## 2019-12-13 NOTE — ED Notes (Signed)
Pt noted to have O2 sats of 85% after dilaudid, pt did not recover after placing on 2L [place], O2 increased to 4L [place], sats now 97%.  Will continue to monitor.

## 2019-12-13 NOTE — Telephone Encounter (Signed)
Patient's husband [name] contacted the office and was asking for an appointment for patient. He states that patient has been experiencing severe abdominal pain, nausea and vomiting since early this morning.  Patient was seen by her urologist yesterday and was prescribed an antibiotic for a UTI - patient was not experiencing these sx prior to starting this medication. I placed patient on a brief hold to speak with triage nurse on recommendations.  I was advised to tell patient to reach out to urologist, as they may want to change antibiotic as this could be a potential reaction, and they may have other recommendations for her.  When I came back to the phone, patient had hung up. I attempted to call back twice and phone line kept ringing. I will try again later.  Will route to Dr. Darnell Level as Juluis Rainier.

## 2019-12-13 NOTE — Sepsis Progress Note (Cosign Needed)
Notified bedside nurse of need to draw repeat lactic acid. 

## 2019-12-13 NOTE — H&P (Addendum)
Katherine Oliver NFA:[id number] DOB: [birth year] DOA: 12/13/2019     PCP: [name], MD   Outpatient Specialists:     Urology Dr. Rosita Kea, [place], [place]-     Patient coming from: home Lives  With family    Chief Complaint: Abdominal pain transferred from [facility] secondary to lack of GI coverage   HPI: Katherine Oliver is a 74 y.o. female with medical history significant of diabetes mellitus type 2, hypertension hyperlipidemia, chronic kidney disease stage IIIa, osteoarthritis, recurrent UTIs   Presented with   nausea vomiting diarrhea abdominal pain  Was seen by urologist yesterday complaining dysuria and urgency.  Started on Macrobid because they were concerned she has UTI.  This morning she started to have severe abdominal pain watery diarrhea and vomiting she had similar episode about a month ago and it resolved on his own. Patient reports that she had cholecystectomy several years ago No associated fevers some chills but no shortness of breath Dysuria seem to be improving.   Infectious risk factors:  Report N/V/Diarrhea/abdominal pain,      In  ER  COVID TEST  NEGATIVE     Lab Results  Component Value Date   [place] NEGATIVE 12/13/2019   [place] Not Detected 06/01/2019    Regarding pertinent Chronic problems:     Hyperlipidemia -  on  fenofibrate Crestor   HTN on Norvasc benazepril    DM 2 -  Lab Results  Component Value Date   HGBA1C 6.3 08/23/2019   on insulin sliding scale PO meds       obesity-   BMI Readings from Last 1 Encounters:  12/12/19 32.31 kg/m     CKD stage III - baseline Cr 1.0 Lab Results  Component Value Date   CREATININE 1.09 (H) 12/13/2019   CREATININE 1.03 08/23/2019   CREATININE 1.03 02/18/2019     While in ER: Found to have likely sepsis with lactic acid up to 5 elevated LFTs total bili of 2 lipase 5200 CT angiogram of the chest negative for PE CT abdomen showed possible pancreatic  necrosis no pseudocyst evidence of obstructing bile duct stone near the ampulla and intra and extrahepatic dilatation. As there is no GI coverage at [facility] patient was transferred to [facility] Provider spoke: GI    Dr. Benson Norway They Recommend admit to medicine  Will see in AM ERCP for the morning  She was started on Zofran IV Dilaudid and Zosyn Her symptoms have somewhat improved but she continued to have severe abdominal pain   The following Work up has been ordered so far:  No orders of the defined types were placed in this encounter.    Following Medications were ordered in ER: Medications  acetaminophen (TYLENOL) tablet 650 mg (has no administration in time range)    Or  acetaminophen (TYLENOL) suppository 650 mg (has no administration in time range)  ondansetron (ZOFRAN) tablet 4 mg (has no administration in time range)    Or  ondansetron (ZOFRAN) injection 4 mg (has no administration in time range)  insulin aspart (novoLOG) injection 0-9 Units (has no administration in time range)  0.9 %  sodium chloride infusion (has no administration in time range)  HYDROmorphone (DILAUDID) injection 0.5 mg (has no administration in time range)  piperacillin-tazobactam (ZOSYN) IVPB 3.375 g (has no administration in time range)    Significant initial  Findings: Abnormal Labs Reviewed  GLUCOSE, CAPILLARY - Abnormal; Notable for the following components:      Result Value  Glucose-Capillary 220 (*)    All other components within normal limits     Otherwise labs showing:    Recent Labs  Lab 12/13/19 1400  NA 140  K 3.6  CO2 22  GLUCOSE 219*  BUN 26*  CREATININE 1.09*  CALCIUM 10.3    Cr  Stable,  Lab Results  Component Value Date   CREATININE 1.09 (H) 12/13/2019   CREATININE 1.03 08/23/2019   CREATININE 1.03 02/18/2019    Recent Labs  Lab 12/13/19 1400  AST 194*  ALT 120*  ALKPHOS 68  BILITOT 2.0*  PROT 7.9  ALBUMIN 4.9   Lab Results  Component Value Date    CALCIUM 10.3 12/13/2019   PHOS 3.5 12/21/2017      WBC       Component Value Date/Time   WBC 26.4 (H) 12/13/2019 1400   ANC    Component Value Date/Time   NEUTROABS 3.4 08/23/2019 0919   ALC No components found for: LYMPHAB    Plt: Lab Results  Component Value Date   PLT 356 12/13/2019     Lactic Acid, Venous    Component Value Date/Time   LATICACIDVEN 4.9 (HH) 12/13/2019 1855    Procalcitonin  Ordered   COVID-19 Labs  No results for input(s): DDIMER, FERRITIN, LDH, CRP in the last 72 hours.  Lab Results  Component Value Date   [place] NEGATIVE 12/13/2019   [place] Not Detected 06/01/2019       HG/HCT  Up from baseline see below    Component Value Date/Time   HGB 16.2 (H) 12/13/2019 1400   HCT 51.2 (H) 12/13/2019 1400    Recent Labs  Lab 12/13/19 1400  LIPASE 5,205*     ECG: Personally reviewed Heart rate 119 sinus tachycardia none specific EKG changes no evidence of acute ischemia continue to monitor no change from prior     DM  labs:  HbA1C: Recent Labs    12/29/18 1531 01/10/19 1114 08/23/19 0919  HGBA1C 6.7* 6.6* 6.3     CBG (last 3)  No results for input(s): GLUCAP in the last 72 hours.     UA  evidence of UTI     Urine analysis:    Component Value Date/Time   COLORURINE AMBER (A) 12/13/2019 1504   APPEARANCEUR CLEAR (A) 12/13/2019 1504   APPEARANCEUR Cloudy (A) 12/12/2019 1332   LABSPEC 1.021 12/13/2019 1504   PHURINE 5.0 12/13/2019 1504   GLUCOSEU 50 (A) 12/13/2019 1504   HGBUR NEGATIVE 12/13/2019 1504   BILIRUBINUR NEGATIVE 12/13/2019 1504   BILIRUBINUR Negative 12/12/2019 1332   KETONESUR NEGATIVE 12/13/2019 1504   PROTEINUR NEGATIVE 12/13/2019 1504   UROBILINOGEN 0.2 02/12/2018 0805   NITRITE POSITIVE (A) 12/13/2019 1504   LEUKOCYTESUR NEGATIVE 12/13/2019 1504      Ordered    CTabd/pelvis -pancreatitis  CTA chest -  nonacute, no PE,   no evidence of infiltrate      ED Triage Vitals  Enc Vitals  Group     BP      Pulse      Resp      Temp      Temp src      SpO2      Weight      Height      Head Circumference      Peak Flow      Pain Score      Pain Loc      Pain Edu?      Excl. in  GC?   PIRJ(18)@       Latest  There were no vitals taken for this visit.     Hospitalist was called for admission for acute gall stone pancreatitis and sepsis   Review of Systems:    Pertinent positives include:  chills, fatigue abdominal pain, nausea, vomiting, diarrhea,  Constitutional:  No weight loss, night sweats, Fevers, weight loss  HEENT:  No headaches, Difficulty swallowing,Tooth/dental problems,Sore throat,  No sneezing, itching, ear ache, nasal congestion, post nasal drip,  Cardio-vascular:  No chest pain, Orthopnea, PND, anasarca, dizziness, palpitations.no Bilateral lower extremity swelling  GI:  No heartburn, indigestion,  change in bowel habits, loss of appetite, melena, blood in stool, hematemesis Resp:  no shortness of breath at rest. No dyspnea on exertion, No excess mucus, no productive cough, No non-productive cough, No coughing up of blood.No change in color of mucus.No wheezing. Skin:  no rash or lesions. No jaundice GU:  no dysuria, change in color of urine, no urgency or frequency. No straining to urinate.  No flank pain.  Musculoskeletal:  No joint pain or no joint swelling. No decreased range of motion. No back pain.  Psych:  No change in mood or affect. No depression or anxiety. No memory loss.  Neuro: no localizing neurological complaints, no tingling, no weakness, no double vision, no gait abnormality, no slurred speech, no confusion  All systems reviewed and apart from [place] all are negative  Past Medical History:   Past Medical History:  Diagnosis Date  . Allergic rhinitis   . Arthritis   . 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 Controlled type 2 diabetes mellitus  with diabetic nephropathy ([place])    DSME at [facility] 01/2016   . Frequent epistaxis 05/16/2019   S/p cauterization with resolution 2020  . GERD (gastroesophageal reflux disease)   . Hepatic steatosis    by abd [place] 05/2012, mild transaminitis - normal iron sat and viral hep panel (2011), stable [place] 2017  . History of chicken pox   . History of measles   . History of recurrent UTIs    on chronic keflex  . HLD (hyperlipidemia)   . HTN (hypertension)   . 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 Kidney cyst, acquired 01/2016   L kidney by [place]  . Kidney stone 01/2016   L kidney by [place]  . Lung nodules 11/2013   overall stable on f/u CT 01/2016  . Osteopenia 06/2013   mild, forearm T -1.1, hip and spine WNL  . Polycythemia    mild, stable (2013)  . Primary localized osteoarthritis of right knee 01/05/2019  . Rosacea    metrogel      Past Surgical History:  Procedure Laterality Date  . APPENDECTOMY  1987  . BREAST BIOPSY Right 1963   benign  . BREAST BIOPSY Right 08/2014   benign- core  . cardiolite stress test  04/2004   normal  . CESAREAN SECTION  8416;6063   x2  . CHOLECYSTECTOMY  2003  . COLONOSCOPY  09/26/2008   adenomatous polyp, rpt 3-5 yrs  . COLONOSCOPY  08/2012   adenomatous polyps, diverticulosis, rec rpt 5 yrs [name])  . COLONOSCOPY WITH PROPOFOL N/A 02/05/2018   4TA, SSA, diverticulosis, rpt 3 yrs [name], [name], MD)  . dexa  2003   normal  . dexa  06/2013  [facility] - Tscore -1.1 forearm, normal spine and femur  . 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 TRANSTHORACIC ECHOCARDIOGRAM  04/2019   EF 55-60%, modLVH, impaired relaxation   . TRIGGER FINGER RELEASE  2007;2010;2011   bilateral  . TRIGGER FINGER RELEASE  02/2017  . VAGINAL HYSTERECTOMY  1984   for menorrhagia, ovaries in place    Social  History:  Ambulatory   independently       reports that she has never smoked. She has never used smokeless tobacco. She reports previous alcohol use of about 1.0 - 2.0 standard drinks of alcohol per week. She reports that she does not use drugs.  Family History:   Family History  Problem Relation Age of Onset  . Stroke Mother        several  . Hyperlipidemia Mother   . Hypertension Mother   . Cancer Father        colon  . Hypertension Father   . Hyperlipidemia Father   . Coronary artery disease Father 32       MIx1, CABG  . Cancer Paternal Aunt        abdominal  . Coronary artery disease Maternal Grandmother   . Diabetes Maternal Grandfather   . Coronary artery disease Maternal Grandfather   . Breast cancer Neg Hx     Allergies: Allergies  Allergen Reactions  . Azithromycin Itching    Okay if takes benadryl along with it  . Nickel     Reaction to cheap earrings  . Sulfa Antibiotics   . Adhesive [Tape] Rash    Paper tape - blisters     Prior to Admission medications   Medication Sig Start Date End Date Taking? Authorizing Provider  insulin aspart (NOVOLOG) 100 UNIT/ML injection Inject 0-6 Units into the skin every 4 (four) hours. 12/13/19   Dhungel, Nishant, MD  ondansetron (ZOFRAN) 4 MG tablet Take 1 tablet (4 mg total) by mouth every 6 (six) hours as needed for nausea. 12/13/19   Dhungel, Nishant, MD  pantoprazole (PROTONIX) 40 MG injection Inject 40 mg into the vein daily. 12/13/19   Dhungel, Flonnie Overman, MD   Physical Exam: There were no vitals taken for this visit. 1. General:  in  Acute distress  complaining of severe pain   Chronically ill  -appearing 2. Psychological: Alert and    Oriented 3. Head/ENT:     Dry Mucous Membranes                          Head Non traumatic, neck supple                           Poor Dentition 4. SKIN:  decreased Skin turgor,  Skin clean Dry and intact no rash 5. Heart: Regular rate and rhythm no  Murmur, no Rub or gallop 6. Lungs:    no wheezes or crackles   7. Abdomen: Soft, epigastric-tender, Non distended  obese   8. Lower extremities: no clubbing, cyanosis, no  edema 9. Neurologically Grossly intact, moving all 4 extremities equally  10. MSK: Normal range of motion   All other LABS:     Recent Labs  Lab 12/13/19 1400  WBC 26.4*  HGB 16.2*  HCT 51.2*  MCV 84.6  PLT 356     Recent  Labs  Lab 12/13/19 1400  NA 140  K 3.6  CL 101  CO2 22  GLUCOSE 219*  BUN 26*  CREATININE 1.09*  CALCIUM 10.3     Recent Labs  Lab 12/13/19 1400  AST 194*  ALT 120*  ALKPHOS 68  BILITOT 2.0*  PROT 7.9  ALBUMIN 4.9       Cultures:    Component Value Date/Time   SDES  01/17/2019 0750    URINE, CATHETERIZED Performed at [facility], [place] [address], [place], [place] [address]    SPECREQUEST  01/17/2019 0750    NONE Performed at [facility], [place] [address], [place], [place] [address]    CULT  01/17/2019 0750    NO GROWTH Performed at [facility], [place] [address], [place],  [address]    REPTSTATUS 01/18/2019 FINAL 01/17/2019 0750     Radiological Exams on Admission: CT Angio Chest PE W and/or Wo Contrast  Result Date: 12/13/2019 CLINICAL DATA:  74 year old female with lower abdominal pain. Shortness of breath. EXAM: CT ANGIOGRAPHY CHEST CT ABDOMEN AND PELVIS WITH CONTRAST TECHNIQUE: Multidetector CT imaging of the chest was performed using the standard protocol during bolus administration of intravenous contrast. Multiplanar CT image reconstructions and MIPs were obtained to evaluate the vascular anatomy. Multidetector CT imaging of the abdomen and pelvis was performed using the standard protocol during bolus administration of intravenous contrast. CONTRAST:  75mL OMNIPAQUE IOHEXOL 350 MG/ML SOLN COMPARISON:  Chest CT 01/11/2016 and CT abdomen 12/02/2013 FINDINGS: CTA CHEST FINDINGS Cardiovascular: Negative for pulmonary embolism. Normal caliber of the  thoracic aorta. Heart size is within normal limits. No significant pericardial fluid. Great vessels patent. Mediastinum/Nodes: Normal appearance of mediastinal structures. No significant node enlargement in the mediastinum or hilar regions. There are small right axillary lymph nodes. However, these right axillary lymph nodes have clearly enlarged from the exam in 2017. Index lymph node measures up to 8 mm on sequence 1, image 21 and this lymph node was barely perceptible on the prior examination. Mild lymphadenopathy in the right axilla is nonspecific. No significant left axillary lymph node enlargement. Lungs/Pleura: Trachea and mainstem bronchi are patent. Dependent atelectasis in both lower lobes. Again noted are small scattered pulmonary nodules. Index lesion in the right middle lobe on sequence 4, image 46 measures 4 mm and stable. There are additional small scattered pulmonary nodules predominantly along the pleural surfaces. Majority of these small pulmonary nodules are stable. Limited evaluation of the lung bases due to the atelectasis. No large pleural effusions. Question a new 2 mm nodule in the periphery of the right lower lobe on sequence 4, image 51. Musculoskeletal: No acute bone abnormality. Review of the MIP images confirms the above findings. CT ABDOMEN and PELVIS FINDINGS Hepatobiliary: Diffuse low-attenuation of the liver is compatible with steatosis. Gallbladder has been removed. Subtle low-density along periphery of the posterior right hepatic lobe on sequence 4, image 20 is nonspecific. There is mild intrahepatic biliary dilatation. Mild dilatation of the common bile duct measuring up to 9 mm on sequence 4, image 38. Large amount of edema and stranding in the porta hepatis. There is a calcification near the distal common bile duct and this is compatible with an obstructing bile duct stone. Pancreas: Extensive edema and inflammatory changes in the upper abdomen centered around the pancreas.  Pancreatic enhancement is heterogeneous and compatible with edema and possibly areas of pancreatic necrosis. No discrete fluid or pseudocyst formations. No significant pancreatic duct dilatation.  Spleen: Normal in size without focal abnormality. Adrenals/Urinary Tract: Normal adrenal glands. Stable appearance of the right kidney. Tiny hypodensities along the right kidney lower pole are suggestive for small cysts. Right extrarenal pelvis is unchanged without right hydronephrosis. Again noted is a exophytic cyst in the left kidney lower pole. There is a new area of decreased attenuation along the posterior left kidney lower pole region on sequence 4, image 50. The central aspect of the low-density area is hyperdense and may be enhancing. Based on the sagittal reformats, this could represent a renal infarct or focal pyelonephritis. Difficult to exclude an atypical lesion at this location. Negative for left hydronephrosis. Normal appearance of the urinary bladder. Indeterminate low-density structure in the medial left kidney on sequence 9 image 27 measures up to 1.0 cm. Stomach/Bowel: Diverticulosis in the sigmoid colon without colonic inflammation. Normal appearance stomach. No evidence for small or large bowel struck shin. Calcification near the distal common bile duct is likely at the ampulla. 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 Main visceral arteries are patent. No abdominopelvic lymphadenopathy. Portal venous system is patent. Splenic vein is patent. Reproductive: Hysterectomy. New or enlarged low-density structure involving the right ovary on sequence 4, image 71. No evidence for a left adnexal lesion. Other: Large amount of edema in the central abdominal mesentery and around the duodenum and porta hepatis. No ascites in pelvis. Negative free air. Musculoskeletal: Focal sclerosis in the left ilium on sequence 4, image 69 is stable. No acute bone abnormality. Review of  the MIP images confirms the above findings. IMPRESSION: 1. Acute pancreatitis likely secondary to an obstructive stone in the distal common bile duct near the ampulla. 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 No evidence for pseudocyst formations at time. Mild intrahepatic and extrahepatic biliary dilatation. Subtle low-density along the posterior right hepatic lobe may be related to inflammation but this area is indeterminate. 2. Indeterminate left renal lesions. Unusual lesion or hypodensities in the posterior left kidney lower pole could represent areas of infarct, focal pyelonephritis or atypical neoplastic lesion. There is also a new indeterminate 1 cm lesion in the medial left kidney. Recommend follow-up renal MRI when patient is stable and can not tolerate an abdominal MRI. 3. Negative for pulmonary embolism.  Volume loss at the lung bases. 4. Chronic small pulmonary nodules. Index pulmonary nodule in right middle lobe has not significantly changed since 2017. 5. **An incidental finding of potential clinical significance has been found. Mildly enlarged right axillary lymph nodes of uncertain etiology. These lymph nodes are small but have enlarged since 2017.** 6. **An incidental finding of potential clinical significance has been found. 2.5 cm low-density structure in the right ovary. This could represent an ovarian or adnexal cyst but atypical for a patient of this age. Recommend follow-up pelvic ultrasound.** These results were called by telephone at the time of interpretation on 12/13/2019 at 4:25 pm to provider [facility] , who verbally acknowledged these results. Electronically Signed   By: Markus Daft M.D.   On: 12/13/2019 16:35   CT ABDOMEN PELVIS W CONTRAST  Result Date: 12/13/2019 CLINICAL DATA:  74 year old female with lower abdominal pain. Shortness of breath. EXAM: CT ANGIOGRAPHY CHEST CT ABDOMEN AND PELVIS WITH CONTRAST TECHNIQUE: Multidetector CT imaging of the  chest was performed using the standard protocol during bolus administration of intravenous contrast. Multiplanar CT image reconstructions and MIPs were obtained to evaluate the vascular anatomy. Multidetector CT imaging of the abdomen and pelvis was performed  using the standard protocol during bolus administration of intravenous contrast. CONTRAST:  38mL OMNIPAQUE IOHEXOL 350 MG/ML SOLN COMPARISON:  Chest CT 01/11/2016 and CT abdomen 12/02/2013 FINDINGS: CTA CHEST FINDINGS Cardiovascular: Negative for pulmonary embolism. Normal caliber of the thoracic aorta. Heart size is within normal limits. No significant pericardial fluid. Great vessels patent. Mediastinum/Nodes: Normal appearance of mediastinal structures. No significant node enlargement in the mediastinum or hilar regions. There are small right axillary lymph nodes. However, these right axillary lymph nodes have clearly enlarged from the exam in 2017. Index lymph node measures up to 8 mm on sequence 1, image 21 and this lymph node was barely perceptible on the prior examination. Mild lymphadenopathy in the right axilla is nonspecific. No significant left axillary lymph node enlargement. Lungs/Pleura: Trachea and mainstem bronchi are patent. Dependent atelectasis in both lower lobes. Again noted are small scattered pulmonary nodules. Index lesion in the right middle lobe on sequence 4, image 46 measures 4 mm and stable. There are additional small scattered pulmonary nodules predominantly along the pleural surfaces. Majority of these small pulmonary nodules are stable. Limited evaluation of the lung bases due to the atelectasis. No large pleural effusions. Question a new 2 mm nodule in the periphery of the right lower lobe on sequence 4, image 51. Musculoskeletal: No acute bone abnormality. Review of the MIP images confirms the above findings. CT ABDOMEN and PELVIS FINDINGS Hepatobiliary: Diffuse low-attenuation of the liver is compatible with steatosis.  Gallbladder has been removed. Subtle low-density along periphery of the posterior right hepatic lobe on sequence 4, image 20 is nonspecific. There is mild intrahepatic biliary dilatation. Mild dilatation of the common bile duct measuring up to 9 mm on sequence 4, image 38. Large amount of edema and stranding in the porta hepatis. There is a calcification near the distal common bile duct and this is compatible with an obstructing bile duct stone. Pancreas: Extensive edema and inflammatory changes in the upper abdomen centered around the pancreas. Pancreatic enhancement is heterogeneous and compatible with edema and possibly areas of pancreatic necrosis. No discrete fluid or pseudocyst formations. No significant pancreatic duct dilatation. Spleen: Normal in size without focal abnormality. Adrenals/Urinary Tract: Normal adrenal glands. Stable appearance of the right kidney. Tiny hypodensities along the right kidney lower pole are suggestive for small cysts. Right extrarenal pelvis is unchanged without right hydronephrosis. Again noted is a exophytic cyst in the left kidney lower pole. There is a new area of decreased attenuation along the posterior left kidney lower pole region on sequence 4, image 50. The central aspect of the low-density area is hyperdense and may be enhancing. Based on the sagittal reformats, this could represent a renal infarct or focal pyelonephritis. Difficult to exclude an atypical lesion at this location. Negative for left hydronephrosis. Normal appearance of the urinary bladder. Indeterminate low-density structure in the medial left kidney on sequence 9 image 27 measures up to 1.0 cm. Stomach/Bowel: Diverticulosis in the sigmoid colon without colonic inflammation. Normal appearance stomach. No evidence for small or large bowel struck shin. Calcification near the distal common bile duct is likely at the ampulla. 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  Main visceral arteries are patent. No abdominopelvic lymphadenopathy. Portal venous system is patent. Splenic vein is patent. Reproductive: Hysterectomy. New or enlarged low-density structure involving the right ovary on sequence 4, image 71. No evidence for a left adnexal lesion. Other: Large amount of edema in the central abdominal mesentery and around the duodenum and porta hepatis. No  ascites in pelvis. Negative free air. Musculoskeletal: Focal sclerosis in the left ilium on sequence 4, image 69 is stable. No acute bone abnormality. Review of the MIP images confirms the above findings. IMPRESSION: 1. Acute pancreatitis likely secondary to an obstructive stone in the distal common bile duct near the ampulla. 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 No evidence for pseudocyst formations at time. Mild intrahepatic and extrahepatic biliary dilatation. Subtle low-density along the posterior right hepatic lobe may be related to inflammation but this area is indeterminate. 2. Indeterminate left renal lesions. Unusual lesion or hypodensities in the posterior left kidney lower pole could represent areas of infarct, focal pyelonephritis or atypical neoplastic lesion. There is also a new indeterminate 1 cm lesion in the medial left kidney. Recommend follow-up renal MRI when patient is stable and can not tolerate an abdominal MRI. 3. Negative for pulmonary embolism.  Volume loss at the lung bases. 4. Chronic small pulmonary nodules. Index pulmonary nodule in right middle lobe has not significantly changed since 2017. 5. **An incidental finding of potential clinical significance has been found. Mildly enlarged right axillary lymph nodes of uncertain etiology. These lymph nodes are small but have enlarged since 2017.** 6. **An incidental finding of potential clinical significance has been found. 2.5 cm low-density structure in the right ovary. This could represent an ovarian or adnexal cyst  but atypical for a patient of this age. Recommend follow-up pelvic ultrasound.** These results were called by telephone at the time of interpretation on 12/13/2019 at 4:25 pm to provider [facility] , who verbally acknowledged these results. Electronically Signed   By: Markus Daft M.D.   On: 12/13/2019 16:35    Chart has been reviewed    Assessment/Plan  74 y.o. female with medical history significant of diabetes mellitus type 2, hypertension hyperlipidemia, chronic kidney disease stage IIIa, osteoarthritis, recurrent UTIs    Admitted for acute gallstone pancreatitis  Present on Admission: . Sepsis ([place]) -   -Patient meets sepsis criteria with   leukocytosis   Tachycardia   Initial lactic acid Lactic Acid, Venous    Component Value Date/Time   LATICACIDVEN 4.9 ([place]) 12/13/2019 1855   Source most likely: pancreatitis  -We will rehydrate, treat with IV antibiotics, follow lactic acid - Await results of blood and urine culture and adjust antibiotics as needed   . Acute gallstone pancreatitis - most likely cause being  gallstone induced    CT of abdomen showing acute pancreatitis  no evidence of pseudocyst But worrisome for necrosis and given elevated white blood cell count Started on antibiotics  Will rehydrate with aggressive IV fluids Keep n.p.o. Follow clinically  -Check lipid panel - Discontinue offending medications ( hydrochlorothiazide, ,) Control pain with IV pain medications   GI to see in the morning     . 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 HTN (hypertension) - hold home meds  . Dyslipidemia associated with type 2 diabetes mellitus ([place]) -  Hold statins for now given LFT elevation  . NAFLD (nonalcoholic fatty liver disease) -no evidence of LFT elevations in the past will check lipid panel  . GERD  (gastroesophageal reflux disease) -chronic stable  . Polycythemia -hemoglobin up from baseline of 14 given dehydration  . Obesity, Class I, BMI 30-34.9 we will need nutritional consult  at the time of discharge  . CKD stage 3 due to type 2 diabetes mellitus ([place]) chronic avoid nephrotoxic medications    Other plan as per orders.  DVT prophylaxis:  SCD   Code Status:  FULL CODE  per patient   I had personally discussed CODE STATUS with patient   Family Communication:   Family not at  Bedside   Disposition Plan:    To home once workup is complete and patient is stable                                        Consults called: GI Dr. Benson [place] is aware   Admission status:  ED Disposition    None         inpatient     Expect 2 midnight stay secondary to severity of patient's current illness including   hemodynamic instability despite optimal treatment (tachycardia )  Severe lab/radiological/exam abnormalities including:    pancreatitis  and extensive comorbidities including:  DM2     Obesity  CKD  That are currently affecting medical management.   I expect  patient to be hospitalized for 2 midnights requiring inpatient medical care.  Patient is at high risk for adverse outcome (such as loss of life or disability) if not treated.  Indication for inpatient stay as follows:   [place] Hemodynamic instability despite maximal medical therapy,    severe pain requiring acute inpatient management,  inability to maintain oral hydration    Need for operative/procedural  intervention    Need for IV antibiotics, IV fluids       Level of care   SDU tele indefinitely please discontinue once patient no longer qualifies   Precautions: admitted as   Covid Negative  No active isolations    PPE: Used by the provider:   P100  eye Goggles,  Gloves    Tal Kempker 12/13/2019, 11:11 PM    [facility]     after 2 AM please page floor coverage PA If 7AM-7PM, please  contact the day team taking care of the patient using [email or web address]   Patient was evaluated in the context of the global COVID-19 pandemic, which necessitated consideration that the patient might be at risk for infection with the SARS-CoV-2 virus that causes COVID-19. Institutional protocols and algorithms that pertain to the evaluation of patients at risk for COVID-19 are in a state of rapid change based on information released by regulatory bodies including the CDC and federal and state organizations. These policies and algorithms were followed during the patient's care.

## 2019-12-13 NOTE — Consult Note (Signed)
CODE SEPSIS - PHARMACY COMMUNICATION  **Broad Spectrum Antibiotics should be administered within 1 hour of Sepsis diagnosis**  Time Code Sepsis Called/Page Received: 1222  Antibiotics Ordered: Zosyn  Time of 1st antibiotic administration: 4114  Additional action taken by pharmacy: none  If necessary, Name of Provider/Nurse Contacted: [place], PharmD, BCPS Clinical Pharmacist 12/13/2019 5:15 PM

## 2019-12-13 NOTE — ED Triage Notes (Addendum)
Pt here for lower abdominal pain starting at lunch time today.  C/o NVD with it.  Currently on abx for UTI but this feels like a different pain.  VSS at this time. No fevers. Took a zofran at home before coming.  NAD in triage.  Unlabored at this time.  Pt mildly clammy.

## 2019-12-13 NOTE — Telephone Encounter (Signed)
Patient admitted with gallstone pancreatitis - to transfer to [facility].  plz call husband tomorrow for update.

## 2019-12-14 ENCOUNTER — Encounter (HOSPITAL_COMMUNITY): Payer: Self-pay | Admitting: Internal Medicine

## 2019-12-14 ENCOUNTER — Encounter (HOSPITAL_COMMUNITY)
Admission: AD | Disposition: A | Discharge status: Home Health | Payer: Self-pay | Source: Other Acute Inpatient Hospital | Attending: Internal Medicine

## 2019-12-14 ENCOUNTER — Inpatient Hospital Stay (HOSPITAL_COMMUNITY): Payer: Medicare Other | Admitting: Anesthesiology

## 2019-12-14 ENCOUNTER — Inpatient Hospital Stay (HOSPITAL_COMMUNITY): Payer: Medicare Other

## 2019-12-14 ENCOUNTER — Ambulatory Visit (HOSPITAL_COMMUNITY): Admission: RE | Admit: 2019-12-14 | Payer: Medicare Other | Source: Home / Self Care | Admitting: Gastroenterology

## 2019-12-14 HISTORY — PX: BILIARY STENT PLACEMENT: SHX5538

## 2019-12-14 HISTORY — PX: SPHINCTEROTOMY: SHX5544

## 2019-12-14 HISTORY — PX: ERCP: SHX5425

## 2019-12-14 LAB — COMPREHENSIVE METABOLIC PANEL
ALT: 402 U/L — ABNORMAL HIGH (ref 0–44)
AST: 474 U/L — ABNORMAL HIGH (ref 15–41)
Albumin: 4.5 g/dL (ref 3.5–5.0)
Alkaline Phosphatase: 75 U/L (ref 38–126)
Anion gap: 18 — ABNORMAL HIGH (ref 5–15)
BUN: 27 mg/dL — ABNORMAL HIGH (ref 8–23)
CO2: 19 mmol/L — ABNORMAL LOW (ref 22–32)
Calcium: 9.7 mg/dL (ref 8.9–10.3)
Chloride: 102 mmol/L (ref 98–111)
Creatinine, Ser: 1.83 mg/dL — ABNORMAL HIGH (ref 0.44–1.00)
GFR calc Af Amer: 31 mL/min — ABNORMAL LOW (ref >60)
GFR calc non Af Amer: 27 mL/min — ABNORMAL LOW (ref >60)
Glucose, Bld: 267 mg/dL — ABNORMAL HIGH (ref 70–99)
Potassium: 5.8 mmol/L — ABNORMAL HIGH (ref 3.5–5.1)
Sodium: 139 mmol/L (ref 135–145)
Total Bilirubin: 1.7 mg/dL — ABNORMAL HIGH (ref 0.3–1.2)
Total Protein: 7 g/dL (ref 6.5–8.1)

## 2019-12-14 LAB — CBC
HCT: 50.6 % — ABNORMAL HIGH (ref 36.0–46.0)
Hemoglobin: 15.6 g/dL — ABNORMAL HIGH (ref 12.0–15.0)
MCH: 26.7 pg (ref 26.0–34.0)
MCHC: 30.8 g/dL (ref 30.0–36.0)
MCV: 86.6 fL (ref 80.0–100.0)
Platelets: 194 10*3/uL (ref 150–400)
RBC: 5.84 MIL/uL — ABNORMAL HIGH (ref 3.87–5.11)
RDW: 14 % (ref 11.5–15.5)
WBC: 12.8 10*3/uL — ABNORMAL HIGH (ref 4.0–10.5)
nRBC: 0 % (ref 0.0–0.2)

## 2019-12-14 LAB — GLUCOSE, CAPILLARY
Glucose-Capillary: 171 mg/dL — ABNORMAL HIGH (ref 70–99)
Glucose-Capillary: 203 mg/dL — ABNORMAL HIGH (ref 70–99)
Glucose-Capillary: 227 mg/dL — ABNORMAL HIGH (ref 70–99)
Glucose-Capillary: 236 mg/dL — ABNORMAL HIGH (ref 70–99)
Glucose-Capillary: 243 mg/dL — ABNORMAL HIGH (ref 70–99)
Glucose-Capillary: 247 mg/dL — ABNORMAL HIGH (ref 70–99)
Glucose-Capillary: 248 mg/dL — ABNORMAL HIGH (ref 70–99)

## 2019-12-14 LAB — CULTURE, URINE COMPREHENSIVE

## 2019-12-14 LAB — MAGNESIUM: Magnesium: 1.7 mg/dL (ref 1.7–2.4)

## 2019-12-14 LAB — LACTIC ACID, PLASMA: Lactic Acid, Venous: 4.9 mmol/L (ref 0.5–1.9)

## 2019-12-14 LAB — LIPASE, BLOOD: Lipase: 1048 U/L — ABNORMAL HIGH (ref 11–51)

## 2019-12-14 LAB — PHOSPHORUS: Phosphorus: 5.2 mg/dL — ABNORMAL HIGH (ref 2.5–4.6)

## 2019-12-14 LAB — CK: Total CK: 38 U/L (ref 38–234)

## 2019-12-14 SURGERY — ERCP, WITH INTERVENTION IF INDICATED
Anesthesia: General

## 2019-12-14 MED ORDER — HYDRALAZINE HCL 20 MG/ML IJ SOLN: 10.0000 mg | INTRAMUSCULAR | Status: DC | PRN

## 2019-12-14 MED ORDER — LACTATED RINGERS IV SOLN: INTRAVENOUS | Status: DC | PRN

## 2019-12-14 MED ORDER — LABETALOL HCL 5 MG/ML IV SOLN
10.0000 mg | INTRAVENOUS | Status: DC | PRN
  Administered 2019-12-16: 10 mg via INTRAVENOUS
  Filled 2019-12-14: qty 4

## 2019-12-14 MED ORDER — ONDANSETRON HCL 4 MG/2ML IJ SOLN
INTRAMUSCULAR | Status: DC | PRN
  Administered 2019-12-14: 4 mg via INTRAVENOUS

## 2019-12-14 MED ORDER — SUCCINYLCHOLINE CHLORIDE 20 MG/ML IJ SOLN
INTRAMUSCULAR | Status: DC | PRN
  Administered 2019-12-14: 120 mg via INTRAVENOUS

## 2019-12-14 MED ORDER — SODIUM CHLORIDE 0.9 % IV SOLN
INTRAVENOUS | Status: DC | PRN
  Administered 2019-12-14: 10 mL

## 2019-12-14 MED ORDER — INSULIN GLARGINE 100 UNIT/ML ~~LOC~~ SOLN
7.0000 [IU] | Freq: Every day | SUBCUTANEOUS | Status: DC
  Administered 2019-12-15 – 2019-12-19 (×5): 7 [IU] via SUBCUTANEOUS
  Filled 2019-12-14 (×7): qty 0.07

## 2019-12-14 MED ORDER — METOCLOPRAMIDE HCL 5 MG/ML IJ SOLN
5.0000 mg | Freq: Four times a day (QID) | INTRAMUSCULAR | Status: DC | PRN
  Filled 2019-12-14: qty 2

## 2019-12-14 MED ORDER — PHENYLEPHRINE HCL-NACL 10-0.9 MG/250ML-% IV SOLN
INTRAVENOUS | Status: DC | PRN
  Administered 2019-12-14: 40 ug/min via INTRAVENOUS

## 2019-12-14 MED ORDER — LIDOCAINE HCL (CARDIAC) PF 100 MG/5ML IV SOSY
PREFILLED_SYRINGE | INTRAVENOUS | Status: DC | PRN
  Administered 2019-12-14: 60 mg via INTRAVENOUS

## 2019-12-14 MED ORDER — FENTANYL CITRATE (PF) 100 MCG/2ML IJ SOLN
INTRAMUSCULAR | Status: DC | PRN
  Administered 2019-12-14: 100 ug via INTRAVENOUS

## 2019-12-14 MED ORDER — LACTATED RINGERS IV SOLN
INTRAVENOUS | Status: DC
  Administered 2019-12-15: 150 mL/h via INTRAVENOUS

## 2019-12-14 MED ORDER — PHENYLEPHRINE HCL (PRESSORS) 10 MG/ML IV SOLN
INTRAVENOUS | Status: DC | PRN
  Administered 2019-12-14: 80 ug via INTRAVENOUS
  Administered 2019-12-14: 120 ug via INTRAVENOUS
  Administered 2019-12-14: 80 ug via INTRAVENOUS

## 2019-12-14 MED ORDER — LACTATED RINGERS IV BOLUS
1000.0000 mL | Freq: Once | INTRAVENOUS | Status: AC
  Administered 2019-12-14: 500 mL via INTRAVENOUS

## 2019-12-14 MED ORDER — INDOMETHACIN 50 MG RE SUPP
RECTAL | Status: DC | PRN
  Administered 2019-12-14: 100 mg via RECTAL

## 2019-12-14 MED ORDER — LEVALBUTEROL HCL 1.25 MG/0.5ML IN NEBU
1.2500 mg | INHALATION_SOLUTION | Freq: Four times a day (QID) | RESPIRATORY_TRACT | Status: DC | PRN
  Administered 2019-12-15: 1.25 mg via RESPIRATORY_TRACT
  Filled 2019-12-14 (×2): qty 0.5

## 2019-12-14 MED ORDER — PROPOFOL 10 MG/ML IV BOLUS
INTRAVENOUS | Status: DC | PRN
  Administered 2019-12-14: 200 mg via INTRAVENOUS

## 2019-12-14 MED ORDER — IPRATROPIUM BROMIDE 0.02 % IN SOLN
0.5000 mg | Freq: Four times a day (QID) | RESPIRATORY_TRACT | Status: DC | PRN
  Administered 2019-12-15: 0.5 mg via RESPIRATORY_TRACT
  Filled 2019-12-14: qty 2.5

## 2019-12-14 NOTE — Transfer of Care (Signed)
Immediate Anesthesia Transfer of Care Note  Patient: Katherine Oliver  Procedure(s) Performed: ENDOSCOPIC RETROGRADE CHOLANGIOPANCREATOGRAPHY (ERCP) (N/A ) SPHINCTEROTOMY BILIARY STENT PLACEMENT  Patient Location: PACU and Endoscopy Unit  Anesthesia Type:General  Level of Consciousness: drowsy  Airway & Oxygen Therapy: Patient Spontanous Breathing and Patient connected to nasal cannula oxygen  Post-op Assessment: Report given to RN, Post -op Vital signs reviewed and stable and Patient moving all extremities  Post vital signs: Reviewed and stable  Last Vitals:  Vitals Value Taken Time  BP 148/41 12/14/19 1408  Temp    Pulse 120 12/14/19 1413  Resp 13 12/14/19 1413  SpO2 96 % 12/14/19 1413  Vitals shown include unvalidated device data.  Last Pain:  Vitals:   12/14/19 1151  TempSrc: Oral  PainSc: 9       Patients Stated Pain Goal: 2 (75/88/32 5498)  Complications: No apparent anesthesia complications

## 2019-12-14 NOTE — Care Management Important Message (Signed)
Important Message  Patient Details  Name: Katherine Oliver MRN: [id number] Date of Birth: 1946/07/05   Medicare Important Message Given:  Yes     Memory Argue 12/14/2019, 11:03 AM    IM  SIGNED BY PATIENT

## 2019-12-14 NOTE — Anesthesia Preprocedure Evaluation (Signed)
Anesthesia Evaluation  Patient identified by MRN, date of birth, ID band Patient awake    Reviewed: Allergy & Precautions, NPO status , Patient's Chart, lab work & pertinent test results  Airway Mallampati: II  TM Distance: >3 FB Neck ROM: Full    Dental no notable dental hx.    Pulmonary neg pulmonary ROS,    Pulmonary exam normal breath sounds clear to auscultation       Cardiovascular hypertension, Pt. on medications Normal cardiovascular exam Rhythm:Regular Rate:Normal  Echo 05/04/2019: The left ventricle has normal systolic function, with an ejection fraction of 55-60%. The cavity size was normal. There is moderately increased left ventricular wall thickness. Left ventricular diastolic Doppler parameters are consistent with  impaired relaxation.   Neuro/Psych negative neurological ROS  negative psych ROS   GI/Hepatic GERD  Medicated and Controlled,Hepatic steatosis Gallstone pancreatitis   Endo/Other  diabetes, Well Controlled, Type 2, Insulin DependentObesity BMI 33  Renal/GU CRFRenal diseaseCKD3 2/2 T2DM, Cr 1.8  negative genitourinary   Musculoskeletal  (+) Arthritis , Osteoarthritis,    Abdominal (+) + obese,   Peds negative pediatric ROS (+)  Hematology negative hematology ROS (+)   Anesthesia Other Findings   Reproductive/Obstetrics negative OB ROS                             Anesthesia Physical  Anesthesia Plan  ASA: III  Anesthesia Plan: General   Post-op Pain Management:  Regional for Post-op pain   Induction: Intravenous and Rapid sequence  PONV Risk Score and Plan: 2 and Ondansetron, Dexamethasone and Treatment may vary due to age or medical condition  Airway Management Planned: Oral ETT  Additional Equipment: None  Intra-op Plan:   Post-operative Plan: Extubation in OR  Informed Consent: I have reviewed the patients History and Physical, chart, labs and  discussed the procedure including the risks, benefits and alternatives for the proposed anesthesia with the patient or authorized representative who has indicated his/her understanding and acceptance.     Dental advisory given  Plan Discussed with: CRNA  Anesthesia Plan Comments:         Anesthesia Quick Evaluation

## 2019-12-14 NOTE — Progress Notes (Signed)
Back from endo by bed awake and alert.

## 2019-12-14 NOTE — Progress Notes (Signed)
Inpatient Diabetes Program Recommendations  AACE/ADA: New Consensus Statement on Inpatient Glycemic Control (2015)  Target Ranges:  Prepandial:   less than 140 mg/dL      Peak postprandial:   less than 180 mg/dL (1-2 hours)      Critically ill patients:  140 - 180 mg/dL   Results for NAJEE, COWENS (MRN [id number]) as of 12/14/2019 07:17  Ref. Range 12/13/2019 21:18 12/14/2019 00:03 12/14/2019 02:17 12/14/2019 06:30  Glucose-Capillary Latest Ref Range: 70 - 99 mg/dL 220 (H)  3 units NOVOLOG  227 (H) 248 (H)  3 units NOVOLOG  236 (H)  3 units NOVOLOG    Results for YICEL, SHANNON (MRN [id number]) as of 12/14/2019 07:17  Ref. Range 08/23/2019 09:19  Hemoglobin A1C Latest Ref Range: 4.6 - 6.5 % 6.3    Admit with: Severe sepsis/ Acute gallstone pancreatitis  History: DM, CKD  Home DM Meds: Metformin 500 mg Daily  Current Orders: Novolog Sensitive Correction Scale/ SSI (0-9 units) Q4 hours    NPO for ERCP today.    MD- Note CBGs in the 200s since admission.  Note that Novolog SSI started at bedtime last night.  CBG 236 at 6:30am today.  Please consider starting low dose basal insulin: Levemir 7 units Daily (0.1 units/kg)      --Will follow patient during hospitalization--  Wyn Quaker RN, MSN, CDE Diabetes Coordinator Inpatient Glycemic Control Team Team Pager: [phone] (8a-5p)

## 2019-12-14 NOTE — Progress Notes (Signed)
Transported to endoscopy by bed awake and alert. 

## 2019-12-14 NOTE — Anesthesia Procedure Notes (Signed)
Procedure Name: Intubation Date/Time: 12/14/2019 1:08 PM Performed by: Oscar Hank T, CRNA Pre-anesthesia Checklist: Patient identified, Emergency Drugs available, Suction available and Patient being monitored Patient Re-evaluated:Patient Re-evaluated prior to induction Oxygen Delivery Method: Circle system utilized Preoxygenation: Pre-oxygenation with 100% oxygen Induction Type: IV induction Ventilation: Mask ventilation without difficulty Laryngoscope Size: Mac and 3 Grade View: Grade IV Tube type: Oral Tube size: 7.5 mm Number of attempts: 3 Airway Equipment and Method: Stylet and Oral airway Placement Confirmation: ETT inserted through vocal cords under direct vision,  positive ETCO2 and breath sounds checked- equal and bilateral Secured at: 22 cm Tube secured with: Tape Dental Injury: Teeth and Oropharynx as per pre-operative assessment  Difficulty Due To: Difficult Airway- due to anterior larynx, Difficult Airway- due to large tongue and Difficult Airway- due to limited oral opening Future Recommendations: Recommend- induction with short-acting agent, and alternative techniques readily available

## 2019-12-14 NOTE — Anesthesia Postprocedure Evaluation (Signed)
Anesthesia Post Note  Patient: Mariateresa Batra Rufener  Procedure(s) Performed: ENDOSCOPIC RETROGRADE CHOLANGIOPANCREATOGRAPHY (ERCP) (N/A ) [place]     Patient location during evaluation: PACU Anesthesia Type: General Level of consciousness: awake and alert, oriented and patient cooperative Pain management: pain level controlled Vital Signs Assessment: post-procedure vital signs reviewed and stable Respiratory status: spontaneous breathing, nonlabored ventilation and respiratory function stable Cardiovascular status: blood pressure returned to baseline and stable Postop Assessment: no apparent nausea or vomiting Anesthetic complications: no    Last Vitals:  Vitals:   12/14/19 1418 12/14/19 1428  BP: (!) 152/68 (!) 146/73  Pulse: (!) 121 (!) 121  Resp: 13 14  Temp:    SpO2: 97% 96%    Last Pain:  Vitals:   12/14/19 1428  TempSrc:   PainSc: 0-No pain                 Pervis Hocking

## 2019-12-14 NOTE — Progress Notes (Addendum)
PROGRESS NOTE    Katherine Oliver  FHL:[id number] DOB: 05/30/1946 DOA: 12/13/2019 PCP: [name], MD   Brief Narrative:  74 year old with history of HTN, DM2, CKD stage IIIa, OA, recurrent UTIs with recent diagnosis of failed on Macrobid admitted to the hospital at [facility] for abdominal pain and watery diarrhea.  Diagnosed with acute gallstone pancreatitis therefore transferred to [facility] for treatment due to lack of GI coverage.  Case was discussed by the previous provider with Dr. Benson Norway, GI.   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  Lipase and LFTs trending downwards. -Maintain NPO.  Aggressive IV fluid hydration -GI consulted.  Will need ERCP. -Pain control.  LDL 74, triglycerides 169 -Leukocytosis improving -Empirically on Zosyn.  Spoke with general surgery regarding ileus and acute gallstone pancreatitis.  No role for their service at this time as patient has history of cholecystectomy but if becomes necessary we will be available especially for worsening ileus  Addendum 450pm Ucx from outpatient growing Enteroccocus. Patient is already on Zosyn. Should cover it.   Distended abdomen/ileus -Secondary to acute intra-abdominal process.  Out of bed to chair.  Hopefully will improve with improvement in pancreatitis.  Monitor electrolytes closely. -Abdominal x-ray-ileus  Acute kidney injury  on CKD stage IIIa -Creatinine-1.2 secondary to DM2. -Avoid nephrotoxic drugs.  Aggressive fluids, monitor urine output.  Sinus tachycardia/acute hypoxia -Currently on 3 L nasal cannula.  CTA chest negative for PE -Chest x-ray shows low lung volumes. -Xopenex/ipratropium nebs-as needed  Diabetes mellitus type 2 -Insulin sliding scale and Accu-Chek -Metformin on hold.  Add Lantus 7 units  Hyperlipidemia -Statin on hold due to transaminitis  Essential hypertension -P.o. meds on hold  GERD -PPI  Right axillary lymphadenopathy 2.5 cm low-density mass in the right ovary -These are incidental findings.  Would recommend outpatient follow-up with primary care physician.  DVT prophylaxis: SCDs Code Status: Full Family Communication:  Called Spouse- Micheal. He is updated.  Disposition Plan:   Patient From= Home  Patient Anticipated D/C place= Home  Barriers= still having significant amount of acute pancreatitis, undergoing ERCP.  Consultants:   GI  Procedures:   ERCP 2/10  Antimicrobials:   Zosyn   Subjective: Seen and examined at bedside this morning, she reported of abdominal discomfort and mild shortness of breath.  Review of Systems Otherwise negative except as per HPI, including: General: Denies fever, chills, night sweats or unintended weight loss. Resp: Denies cough, wheezing, shortness of breath. Cardiac: Denies chest pain, palpitations, orthopnea, paroxysmal nocturnal dyspnea. GI: Deniesvomiting, diarrhea or constipation GU: Denies dysuria, frequency, hesitancy or incontinence MS: Denies muscle aches, joint pain or swelling Neuro: Denies headache, neurologic deficits (focal weakness, numbness, tingling), abnormal gait Psych: Denies anxiety, depression, SI/HI/AVH Skin: Denies new rashes or lesions ID: Denies sick contacts, exotic exposures, travel  Examination:  General exam: Somewhat ill-appearing and uncomfortable due to abdominal pain Respiratory  system: Bibasilar rhonchi Cardiovascular system: Sinus tachycardia Gastrointestinal system:  Abdomen was distended with diminished bowel sounds. Central nervous system: Alert and oriented. No focal neurological deficits. Extremities: Symmetric 5 x 5 power. Skin: No rashes, lesions or ulcers Psychiatry: Judgement and insight appear normal. Mood & affect appropriate.     Objective: Vitals:   12/13/19 2347 12/13/19 2348 12/14/19 0100 12/14/19 0320  BP: (!) 180/91   111/86  Pulse:    (!) 131  Resp:    20  Temp: 98.5 F (36.9 C) (!) 97.5 F (36.4 C)  (!) 97.4 F (36.3 C)  TempSrc: Axillary Oral  Oral  SpO2: 92%  95% 96%  Weight:      Height:        Intake/Output Summary (Last 24 hours) at 12/14/2019 0750 Last data filed at 12/14/2019 0300 Gross per 24 hour  Intake --  Output 425 ml  Net -425 ml   Filed Weights   12/13/19 2044 12/13/19 2100  Weight: 78.3 kg 78.3 kg     Data Reviewed:   CBC: Recent Labs  Lab 12/13/19 1400 12/14/19 0215  WBC 26.4* 12.8*  HGB 16.2* 15.6*  HCT 51.2* 50.6*  MCV 84.6 86.6  PLT 356 893   Basic Metabolic Panel: Recent Labs  Lab 12/13/19 1400 12/13/19 2230 12/14/19 0215  NA 140 141 139  K 3.6 4.8 5.8*  CL 101 103 102  CO2 22 18* 19*  GLUCOSE 219* 265* 267*  BUN 26* 25* 27*  CREATININE 1.09* 1.57* 1.83*  CALCIUM 10.3 9.8 9.7  MG  --   --  1.7  PHOS  --   --  5.2*   GFR: Estimated Creatinine Clearance: 25.2 mL/min (A) (by C-G formula based on SCr of 1.83 mg/dL (H)). Liver Function Tests: Recent Labs  Lab 12/13/19 1400 12/13/19 2230 12/14/19 0215  AST 194* 542* 474*  ALT 120* 390* 402*  ALKPHOS 68 78 75  BILITOT 2.0* 2.0* 1.7*  PROT 7.9 7.1 7.0  ALBUMIN 4.9 4.6 4.5   Recent Labs  Lab 12/13/19 1400 12/14/19 0215  LIPASE 5,205* 1,048*   No results for input(s): AMMONIA in the last 168 hours. Coagulation Profile: No results for input(s): INR, PROTIME in the last 168 hours. Cardiac Enzymes: Recent Labs  Lab  12/14/19 0215  CKTOTAL 38   BNP (last 3 results) Recent Labs    12/29/18 1531  PROBNP 26.0   HbA1C: No results for input(s): HGBA1C in the last 72 hours. CBG: Recent Labs  Lab 12/13/19 2118 12/14/19 0003 12/14/19 0217 12/14/19 0630  GLUCAP 220* 227* 248* 236*   Lipid Profile: Recent Labs    12/13/19 2230  CHOL 160  HDL 52  LDLCALC 74  TRIG 169*  CHOLHDL 3.1   Thyroid Function Tests: No results for input(s): TSH, T4TOTAL, FREET4, T3FREE, THYROIDAB in the last 72 hours. Anemia Panel: No results for input(s): VITAMINB12, FOLATE, FERRITIN, TIBC, IRON, RETICCTPCT in the last 72 hours. Sepsis Labs: Recent Labs  Lab 12/13/19 1620 12/13/19 1855 12/13/19 2230 12/14/19 0215  PROCALCITON  --   --  3.47  --   LATICACIDVEN 4.4* 4.9* 6.2* 4.9*    Recent Results (from the past 240 hour(s))  Microscopic Examination     Status: Abnormal   Collection Time: 12/12/19  1:32 PM   URINE  Result Value Ref Range Status   WBC, UA >30W 0 - 5 /hpf Final   RBC None seen 0 - 2 /hpf Final   Epithelial Cells (non renal) 0-10 0 - 10 /hpf Final   Renal  [place], UA 0-10 (A) None seen /hpf Final   Bacteria, UA Few None seen/Few Final  CULTURE, URINE COMPREHENSIVE     Status: None (Preliminary result)   Collection Time: 12/12/19  1:52 PM   Specimen: Urine   UR  Result Value Ref Range Status   Urine Culture, Comprehensive Preliminary report  Preliminary   Organism ID, Bacteria Comment  Preliminary    Comment: Microbiological testing to rule out the presence of possible pathogens is in progress. Greater than 100,000 colony forming units per mL   Blood culture (routine x 2)     Status: None (Preliminary result)   Collection Time: 12/13/19  3:04 PM   Specimen: BLOOD  Result Value Ref Range Status   Specimen Description BLOOD RIGHT WRIST  Final   Special Requests   Final    BOTTLES DRAWN AEROBIC AND ANAEROBIC Blood Culture results may not be optimal due to an inadequate volume of blood  received in culture bottles   Culture   Final    NO GROWTH < 24 HOURS Performed at [facility], [address], [place], [place] [address]    Report Status PENDING  Incomplete  Blood culture (routine x 2)     Status: None (Preliminary result)   Collection Time: 12/13/19  3:04 PM   Specimen: BLOOD  Result Value Ref Range Status   Specimen Description BLOOD LEFT HAND  Final   Special Requests   Final    BOTTLES DRAWN AEROBIC AND ANAEROBIC Blood Culture results may not be optimal due to an inadequate volume of blood received in culture bottles   Culture   Final    NO GROWTH < 24 HOURS Performed at [facility], [address], [place],  [address]    Report Status PENDING  Incomplete  Respiratory Panel by RT PCR (Flu A&B, Covid) - Nasopharyngeal Swab     Status: None   Collection Time: 12/13/19  3:04 PM   Specimen: Nasopharyngeal Swab  Result Value Ref Range Status   SARS Coronavirus 2 by RT PCR NEGATIVE NEGATIVE Final    Comment: (NOTE) SARS-CoV-2 target nucleic acids are NOT DETECTED. The SARS-CoV-2 RNA is generally detectable in upper respiratoy specimens during the acute phase of infection. The lowest concentration of SARS-CoV-2 viral copies this assay can detect is 131 copies/mL. A negative result does not preclude SARS-Cov-2 infection and should not be used as the sole basis for treatment or other patient management decisions. A negative result may occur with  improper specimen collection/handling, submission of specimen other than nasopharyngeal swab, presence of viral mutation(s) within the areas targeted by this assay, and inadequate number of viral copies (<131 copies/mL). A negative result must be combined with clinical observations, patient history, and epidemiological information. The expected result is Negative. Fact Sheet for Patients:  PinkCheek.be Fact Sheet for Healthcare Providers:    GravelBags.it This test is not yet ap proved or cleared by the [place] FDA and  has been authorized for detection and/or diagnosis of SARS-CoV-2 by FDA under an Emergency Use Authorization (EUA). This EUA will remain  in effect (meaning this test can be used) for the duration of the COVID-19 declaration under Section 564(b)(1) of the Act, 21 U.S.C. section 360bbb-3(b)(1), unless the authorization is terminated or revoked sooner.    Influenza A by PCR NEGATIVE NEGATIVE Final   Influenza B by PCR NEGATIVE NEGATIVE Final    Comment: (NOTE) The Xpert Xpress SARS-CoV-2/FLU/RSV assay is intended as an aid in  the diagnosis of influenza from Nasopharyngeal swab specimens and  should not be used as a sole basis for treatment. Nasal washings and  aspirates are unacceptable for Xpert Xpress SARS-CoV-2/FLU/RSV  testing. Fact Sheet for Patients: PinkCheek.be Fact Sheet for Healthcare Providers: GravelBags.it This test is not yet approved or cleared by the [place] FDA and  has been authorized for detection and/or diagnosis of SARS-CoV-2 by  FDA under an Emergency Use Authorization (EUA). This EUA will remain  in effect (meaning this test can be used) for the duration of the  Covid-19 declaration under Section 564(b)(1) of the Act, 21  U.S.C. section 360bbb-3(b)(1), unless the authorization is  terminated or revoked. Performed at [facility], [address] [place], [place] [address]          Radiology Studies: CT Angio Chest PE W and/or Wo Contrast  Result Date: 12/13/2019 CLINICAL DATA:  74 year old female with lower abdominal pain. Shortness of breath. EXAM: CT ANGIOGRAPHY CHEST CT ABDOMEN AND PELVIS WITH CONTRAST TECHNIQUE: Multidetector CT imaging of the chest was performed using the standard protocol during bolus administration of intravenous contrast. Multiplanar CT image  reconstructions and MIPs were obtained to evaluate the vascular anatomy. Multidetector CT imaging of the abdomen and pelvis was performed using the standard protocol during bolus administration of intravenous contrast. CONTRAST:  53mL OMNIPAQUE IOHEXOL 350 MG/ML SOLN COMPARISON:  Chest CT 01/11/2016 and CT abdomen 12/02/2013 FINDINGS: CTA CHEST FINDINGS Cardiovascular: Negative for pulmonary embolism. Normal caliber of the thoracic aorta. Heart size is within normal limits. No significant pericardial fluid. Great vessels patent. Mediastinum/Nodes: Normal appearance of mediastinal structures. No significant node enlargement in the mediastinum or hilar regions. There are small right axillary lymph nodes. However, these right axillary lymph nodes have clearly enlarged from the exam in 2017. Index lymph node measures up to 8 mm on sequence 1, image 21 and this lymph node was barely perceptible on the prior examination. Mild lymphadenopathy in the right axilla is nonspecific. No significant left axillary lymph node enlargement. Lungs/Pleura: Trachea and mainstem bronchi are patent. Dependent atelectasis in both lower lobes. Again noted are small scattered pulmonary nodules. Index lesion in the right middle lobe on sequence 4, image 46 measures 4 mm and stable. There are additional small scattered pulmonary nodules predominantly along the pleural surfaces. Majority of these small pulmonary nodules are stable. Limited evaluation of the lung bases due to the atelectasis. No large pleural effusions. Question a new 2 mm nodule in the periphery of the right lower lobe on sequence 4, image 51. Musculoskeletal: No acute bone abnormality. Review of the MIP images confirms the above findings. CT ABDOMEN and PELVIS FINDINGS Hepatobiliary: Diffuse low-attenuation of the liver is compatible with steatosis. Gallbladder has been removed. Subtle low-density along periphery of the posterior right hepatic lobe on sequence 4, image 20 is  nonspecific. There is mild intrahepatic biliary dilatation. Mild dilatation of the common bile duct measuring up to 9 mm on sequence 4, image 38. Large amount of edema and stranding in the porta hepatis. There is a calcification near the distal common bile duct and this is compatible with an obstructing bile duct stone. Pancreas: Extensive edema and inflammatory changes in the upper abdomen centered around the pancreas. Pancreatic enhancement is heterogeneous and compatible with edema and possibly areas of pancreatic necrosis. No discrete fluid or pseudocyst formations. No significant pancreatic duct dilatation. Spleen: Normal in size without focal abnormality. Adrenals/Urinary Tract: Normal adrenal glands. Stable appearance of the right kidney. Tiny  hypodensities along the right kidney lower pole are suggestive for small cysts. Right extrarenal pelvis is unchanged without right hydronephrosis. Again noted is a exophytic cyst in the left kidney lower pole. There is a new area of decreased attenuation along the posterior left kidney lower pole region on sequence 4, image 50. The central aspect of the low-density area is hyperdense and may be enhancing. Based on the sagittal reformats, this could represent a renal infarct or focal pyelonephritis. Difficult to exclude an atypical lesion at this location. Negative for left hydronephrosis. Normal appearance of the urinary bladder. Indeterminate low-density structure in the medial left kidney on sequence 9 image 27 measures up to 1.0 cm. Stomach/Bowel: Diverticulosis in the sigmoid colon without colonic inflammation. Normal appearance stomach. No evidence for small or large bowel struck shin. Calcification near the distal common bile duct is likely at the ampulla. 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 Main visceral arteries are patent. No abdominopelvic lymphadenopathy. Portal venous system is patent. Splenic vein is patent.  Reproductive: Hysterectomy. New or enlarged low-density structure involving the right ovary on sequence 4, image 71. No evidence for a left adnexal lesion. Other: Large amount of edema in the central abdominal mesentery and around the duodenum and porta hepatis. No ascites in pelvis. Negative free air. Musculoskeletal: Focal sclerosis in the left ilium on sequence 4, image 69 is stable. No acute bone abnormality. Review of the MIP images confirms the above findings. IMPRESSION: 1. Acute pancreatitis likely secondary to an obstructive stone in the distal common bile duct near the ampulla. 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 No evidence for pseudocyst formations at time. Mild intrahepatic and extrahepatic biliary dilatation. Subtle low-density along the posterior right hepatic lobe may be related to inflammation but this area is indeterminate. 2. Indeterminate left renal lesions. Unusual lesion or hypodensities in the posterior left kidney lower pole could represent areas of infarct, focal pyelonephritis or atypical neoplastic lesion. There is also a new indeterminate 1 cm lesion in the medial left kidney. Recommend follow-up renal MRI when patient is stable and can not tolerate an abdominal MRI. 3. Negative for pulmonary embolism.  Volume loss at the lung bases. 4. Chronic small pulmonary nodules. Index pulmonary nodule in right middle lobe has not significantly changed since 2017. 5. **An incidental finding of potential clinical significance has been found. Mildly enlarged right axillary lymph nodes of uncertain etiology. These lymph nodes are small but have enlarged since 2017.** 6. **An incidental finding of potential clinical significance has been found. 2.5 cm low-density structure in the right ovary. This could represent an ovarian or adnexal cyst but atypical for a patient of this age. Recommend follow-up pelvic ultrasound.** These results were called by telephone at the  time of interpretation on 12/13/2019 at 4:25 pm to provider [facility] , who verbally acknowledged these results. Electronically Signed   By: Markus Daft M.D.   On: 12/13/2019 16:35   CT ABDOMEN PELVIS W CONTRAST  Result Date: 12/13/2019 CLINICAL DATA:  74 year old female with lower abdominal pain. Shortness of breath. EXAM: CT ANGIOGRAPHY CHEST CT ABDOMEN AND PELVIS WITH CONTRAST TECHNIQUE: Multidetector CT imaging of the chest was performed using the standard protocol during bolus administration of intravenous contrast. Multiplanar CT image reconstructions and MIPs were obtained to evaluate the vascular anatomy. Multidetector CT imaging of the abdomen and pelvis was performed using the standard protocol during bolus administration of intravenous contrast. CONTRAST:  20mL OMNIPAQUE IOHEXOL 350 MG/ML SOLN COMPARISON:  Chest CT 01/11/2016 and CT abdomen 12/02/2013 FINDINGS: CTA CHEST FINDINGS Cardiovascular: Negative for pulmonary embolism. Normal caliber of the thoracic aorta. Heart size is within normal limits. No significant pericardial fluid. Great vessels patent. Mediastinum/Nodes: Normal appearance of mediastinal structures. No significant node enlargement in the mediastinum or hilar regions. There are small right axillary lymph nodes. However, these right axillary lymph nodes have clearly enlarged from the exam in 2017. Index lymph node measures up to 8 mm on sequence 1, image 21 and this lymph node was barely perceptible on the prior examination. Mild lymphadenopathy in the right axilla is nonspecific. No significant left axillary lymph node enlargement. Lungs/Pleura: Trachea and mainstem bronchi are patent. Dependent atelectasis in both lower lobes. Again noted are small scattered pulmonary nodules. Index lesion in the right middle lobe on sequence 4, image 46 measures 4 mm and stable. There are additional small scattered pulmonary nodules predominantly along the pleural surfaces. Majority of these small  pulmonary nodules are stable. Limited evaluation of the lung bases due to the atelectasis. No large pleural effusions. Question a new 2 mm nodule in the periphery of the right lower lobe on sequence 4, image 51. Musculoskeletal: No acute bone abnormality. Review of the MIP images confirms the above findings. CT ABDOMEN and PELVIS FINDINGS Hepatobiliary: Diffuse low-attenuation of the liver is compatible with steatosis. Gallbladder has been removed. Subtle low-density along periphery of the posterior right hepatic lobe on sequence 4, image 20 is nonspecific. There is mild intrahepatic biliary dilatation. Mild dilatation of the common bile duct measuring up to 9 mm on sequence 4, image 38. Large amount of edema and stranding in the porta hepatis. There is a calcification near the distal common bile duct and this is compatible with an obstructing bile duct stone. Pancreas: Extensive edema and inflammatory changes in the upper abdomen centered around the pancreas. Pancreatic enhancement is heterogeneous and compatible with edema and possibly areas of pancreatic necrosis. No discrete fluid or pseudocyst formations. No significant pancreatic duct dilatation. Spleen: Normal in size without focal abnormality. Adrenals/Urinary Tract: Normal adrenal glands. Stable appearance of the right kidney. Tiny hypodensities along the right kidney lower pole are suggestive for small cysts. Right extrarenal pelvis is unchanged without right hydronephrosis. Again noted is a exophytic cyst in the left kidney lower pole. There is a new area of decreased attenuation along the posterior left kidney lower pole region on sequence 4, image 50. The central aspect of the low-density area is hyperdense and may be enhancing. Based on the sagittal reformats, this could represent a renal infarct or focal pyelonephritis. Difficult to exclude an atypical lesion at this location. Negative for left hydronephrosis. Normal appearance of the urinary bladder.  Indeterminate low-density structure in the medial left kidney on sequence 9 image 27 measures up to 1.0 cm. Stomach/Bowel: Diverticulosis in the sigmoid colon without colonic inflammation. Normal appearance stomach. No evidence for small or large bowel struck shin. Calcification near the distal common bile duct is likely at the ampulla. 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 Main visceral arteries are patent. No abdominopelvic lymphadenopathy. Portal venous system is patent. Splenic vein is patent. Reproductive: Hysterectomy. New or enlarged low-density structure involving the right ovary on sequence 4, image 71. No evidence for a left adnexal lesion. Other: Large amount of edema in the central abdominal mesentery and around the duodenum and porta hepatis. No ascites in pelvis. Negative free air. Musculoskeletal: Focal sclerosis in the left ilium on sequence 4, image 69 is stable.  No acute bone abnormality. Review of the MIP images confirms the above findings. IMPRESSION: 1. Acute pancreatitis likely secondary to an obstructive stone in the distal common bile duct near the ampulla. 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 No evidence for pseudocyst formations at time. Mild intrahepatic and extrahepatic biliary dilatation. Subtle low-density along the posterior right hepatic lobe may be related to inflammation but this area is indeterminate. 2. Indeterminate left renal lesions. Unusual lesion or hypodensities in the posterior left kidney lower pole could represent areas of infarct, focal pyelonephritis or atypical neoplastic lesion. There is also a new indeterminate 1 cm lesion in the medial left kidney. Recommend follow-up renal MRI when patient is stable and can not tolerate an abdominal MRI. 3. Negative for pulmonary embolism.  Volume loss at the lung bases. 4. Chronic small pulmonary nodules. Index pulmonary nodule in right middle lobe  has not significantly changed since 2017. 5. **An incidental finding of potential clinical significance has been found. Mildly enlarged right axillary lymph nodes of uncertain etiology. These lymph nodes are small but have enlarged since 2017.** 6. **An incidental finding of potential clinical significance has been found. 2.5 cm low-density structure in the right ovary. This could represent an ovarian or adnexal cyst but atypical for a patient of this age. Recommend follow-up pelvic ultrasound.** These results were called by telephone at the time of interpretation on 12/13/2019 at 4:25 pm to provider [facility] , who verbally acknowledged these results. Electronically Signed   By: Markus Daft M.D.   On: 12/13/2019 16:35        Scheduled Meds: . insulin aspart  0-9 Units Subcutaneous Q4H   Continuous Infusions: . piperacillin-tazobactam (ZOSYN)  IV 3.375 g (12/14/19 0020)     LOS: 1 day   Time spent= 40 mins    Isack Lavalley Arsenio Loader, MD [facility] Hospitalists  If 7PM-7AM, please contact night-coverage  12/14/2019, 7:50 AM

## 2019-12-14 NOTE — Progress Notes (Signed)
MD made aware of heart rate of 130's sinus tach asymptomatic since admission with order. Continue to monitor.

## 2019-12-14 NOTE — Consult Note (Signed)
Reason for Consult: Gallstone pancreatitis Referring Physician: [facility]  Gilmore Laroche Privette HPI: This is a 73 year old female with multiple medical problems transferred from [facility] for gallstone pancreatitis.  At 11 AM yesterday she started to experience epigastric pain, nausea, vomiting, and diarrhea.  These symptoms persisted and then she presented to the ER.  Further work up showed that she had a gallstone pancreatitis.  The CT scan reveals a calcified stone in the ampulla and some mild ductal dilation.  Earlier in January she recalled the exact same symptoms, but these symptoms resolved after one hour.  Past Medical History:  Diagnosis Date  . Allergic rhinitis   . Arthritis   . 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 nephropathy ([place])    DSME at [facility] 01/2016   . Frequent epistaxis 05/16/2019   S/p cauterization with resolution 2020  . GERD (gastroesophageal reflux disease)   . Hepatic steatosis    by abd [place] 05/2012, mild transaminitis - normal iron sat and viral hep panel (2011), stable [place] 2017  . History of chicken pox   . History of measles   . History of recurrent UTIs    on chronic keflex  . HLD (hyperlipidemia)   . HTN (hypertension)   . 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 Kidney cyst, acquired 01/2016   L kidney by [place]  . Kidney stone 01/2016   L kidney by [place]  . Lung nodules 11/2013   overall stable on f/u CT 01/2016  . Osteopenia 06/2013   mild, forearm T -1.1, hip and spine WNL  . Polycythemia    mild, stable (2013)  . Primary localized osteoarthritis of right knee 01/05/2019  . Rosacea    metrogel    Past Surgical History:  Procedure Laterality Date  . APPENDECTOMY  1987  . BREAST BIOPSY Right 1963   benign  . BREAST BIOPSY Right 08/2014   benign- core  . cardiolite  stress test  04/2004   normal  . CESAREAN SECTION  3734;2876   x2  . CHOLECYSTECTOMY  2003  . COLONOSCOPY  09/26/2008   adenomatous polyp, rpt 3-5 yrs  . COLONOSCOPY  08/2012   adenomatous polyps, diverticulosis, rec rpt 5 yrs [name])  . COLONOSCOPY WITH PROPOFOL N/A 02/05/2018   4TA, SSA, diverticulosis, rpt 3 yrs [name], [name], MD)  . dexa  2003   normal  . dexa  06/2013   [facility] - Tscore -1.1 forearm, normal spine and femur  . 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 TRANSTHORACIC ECHOCARDIOGRAM  04/2019   EF 55-60%, modLVH, impaired relaxation   . TRIGGER FINGER RELEASE  2007;2010;2011   bilateral  . TRIGGER FINGER RELEASE  02/2017  . VAGINAL HYSTERECTOMY  1984   for menorrhagia, ovaries in place    Family History  Problem Relation Age of Onset  . Stroke Mother        several  . Hyperlipidemia Mother   . Hypertension Mother   . Cancer Father        colon  . Hypertension Father   . Hyperlipidemia Father   . Coronary artery disease Father 59       MIx1, CABG  .  Cancer Paternal Aunt        abdominal  . Coronary artery disease Maternal Grandmother   . Diabetes Maternal Grandfather   . Coronary artery disease Maternal Grandfather   . Breast cancer Neg Hx     Social History:  reports that she has never smoked. She has never used smokeless tobacco. She reports previous alcohol use of about 1.0 - 2.0 standard drinks of alcohol per week. She reports that she does not use drugs.  Allergies:  Allergies  Allergen Reactions  . Azithromycin Itching    Okay if takes benadryl along with it  . Nickel     Reaction to cheap earrings  . Sulfa Antibiotics   . Adhesive [Tape] Rash    Paper tape - blisters    Medications:  Scheduled: . insulin aspart  0-9 Units Subcutaneous Q4H  . insulin glargine  7 Units Subcutaneous Daily    Continuous: . piperacillin-tazobactam (ZOSYN)  IV 3.375 g (12/14/19 0020)    Results for orders placed or performed during the hospital encounter of 12/13/19 (from the past 24 hour(s))  Glucose, capillary     Status: Abnormal   Collection Time: 12/13/19  9:18 PM  Result Value Ref Range   Glucose-Capillary 220 (H) 70 - 99 mg/dL  Lactic acid, plasma     Status: Abnormal   Collection Time: 12/13/19 10:30 PM  Result Value Ref Range   Lactic Acid, Venous 6.2 (HH) 0.5 - 1.9 mmol/L  Procalcitonin     Status: None   Collection Time: 12/13/19 10:30 PM  Result Value Ref Range   Procalcitonin 3.47 ng/mL  Comprehensive metabolic panel     Status: Abnormal   Collection Time: 12/13/19 10:30 PM  Result Value Ref Range   Sodium 141 135 - 145 mmol/L   Potassium 4.8 3.5 - 5.1 mmol/L   Chloride 103 98 - 111 mmol/L   CO2 18 (L) 22 - 32 mmol/L   Glucose, Bld 265 (H) 70 - 99 mg/dL   BUN 25 (H) 8 - 23 mg/dL   Creatinine, Ser 1.57 (H) 0.44 - 1.00 mg/dL   Calcium 9.8 8.9 - 10.3 mg/dL   Total Protein 7.1 6.5 - 8.1 g/dL   Albumin 4.6 3.5 - 5.0 g/dL   AST 542 (H) 15 - 41 U/L   ALT 390 (H) 0 - 44 U/L   Alkaline Phosphatase 78 38 - 126 U/L   Total Bilirubin 2.0 (H) 0.3 - 1.2 mg/dL   GFR calc non Af Amer 32 (L) >60 mL/min   GFR calc Af Amer 37 (L) >60 mL/min   Anion gap 20 (H) 5 - 15  Lipid panel     Status: Abnormal   Collection Time: 12/13/19 10:30 PM  Result Value Ref Range   Cholesterol 160 0 - 200 mg/dL   Triglycerides 169 (H) <150 mg/dL   HDL 52 >40 mg/dL   Total CHOL/HDL Ratio 3.1 RATIO   VLDL 34 0 - 40 mg/dL   LDL Cholesterol 74 0 - 99 mg/dL  Glucose, capillary     Status: Abnormal   Collection Time: 12/14/19 12:03 AM  Result Value Ref Range   Glucose-Capillary 227 (H) 70 - 99 mg/dL  Magnesium     Status: None   Collection Time: 12/14/19  2:15 AM  Result Value Ref Range   Magnesium 1.7 1.7 - 2.4 mg/dL  Phosphorus     Status: Abnormal   Collection Time: 12/14/19  2:15 AM  Result  Value Ref Range  Phosphorus 5.2 (H) 2.5 - 4.6 mg/dL  Comprehensive metabolic panel     Status: Abnormal   Collection Time: 12/14/19  2:15 AM  Result Value Ref Range   Sodium 139 135 - 145 mmol/L   Potassium 5.8 (H) 3.5 - 5.1 mmol/L   Chloride 102 98 - 111 mmol/L   CO2 19 (L) 22 - 32 mmol/L   Glucose, Bld 267 (H) 70 - 99 mg/dL   BUN 27 (H) 8 - 23 mg/dL   Creatinine, Ser 1.83 (H) 0.44 - 1.00 mg/dL   Calcium 9.7 8.9 - 10.3 mg/dL   Total Protein 7.0 6.5 - 8.1 g/dL   Albumin 4.5 3.5 - 5.0 g/dL   AST 474 (H) 15 - 41 U/L   ALT 402 (H) 0 - 44 U/L   Alkaline Phosphatase 75 38 - 126 U/L   Total Bilirubin 1.7 (H) 0.3 - 1.2 mg/dL   GFR calc non Af Amer 27 (L) >60 mL/min   GFR calc Af Amer 31 (L) >60 mL/min   Anion gap 18 (H) 5 - 15  CBC     Status: Abnormal   Collection Time: 12/14/19  2:15 AM  Result Value Ref Range   WBC 12.8 (H) 4.0 - 10.5 K/uL   RBC 5.84 (H) 3.87 - 5.11 MIL/uL   Hemoglobin 15.6 (H) 12.0 - 15.0 g/dL   HCT 50.6 (H) 36.0 - 46.0 %   MCV 86.6 80.0 - 100.0 fL   MCH 26.7 26.0 - 34.0 pg   MCHC 30.8 30.0 - 36.0 g/dL   RDW 14.0 11.5 - 15.5 %   Platelets 194 150 - 400 K/uL   nRBC 0.0 0.0 - 0.2 %  Lactic acid, plasma     Status: Abnormal   Collection Time: 12/14/19  2:15 AM  Result Value Ref Range   Lactic Acid, Venous 4.9 (HH) 0.5 - 1.9 mmol/L  Lipase, blood     Status: Abnormal   Collection Time: 12/14/19  2:15 AM  Result Value Ref Range   Lipase 1,048 (H) 11 - 51 U/L  CK     Status: None   Collection Time: 12/14/19  2:15 AM  Result Value Ref Range   Total CK 38 38 - 234 U/L  Glucose, capillary     Status: Abnormal   Collection Time: 12/14/19  2:17 AM  Result Value Ref Range   Glucose-Capillary 248 (H) 70 - 99 mg/dL  Glucose, capillary     Status: Abnormal   Collection Time: 12/14/19  6:30 AM  Result Value Ref Range   Glucose-Capillary 236 (H) 70 - 99 mg/dL     CT Angio Chest PE W and/or Wo Contrast  Result Date: 12/13/2019 CLINICAL DATA:  74 year old female  with lower abdominal pain. Shortness of breath. EXAM: CT ANGIOGRAPHY CHEST CT ABDOMEN AND PELVIS WITH CONTRAST TECHNIQUE: Multidetector CT imaging of the chest was performed using the standard protocol during bolus administration of intravenous contrast. Multiplanar CT image reconstructions and MIPs were obtained to evaluate the vascular anatomy. Multidetector CT imaging of the abdomen and pelvis was performed using the standard protocol during bolus administration of intravenous contrast. CONTRAST:  6mL OMNIPAQUE IOHEXOL 350 MG/ML SOLN COMPARISON:  Chest CT 01/11/2016 and CT abdomen 12/02/2013 FINDINGS: CTA CHEST FINDINGS Cardiovascular: Negative for pulmonary embolism. Normal caliber of the thoracic aorta. Heart size is within normal limits. No significant pericardial fluid. Great vessels patent. Mediastinum/Nodes: Normal appearance of mediastinal structures. No significant node enlargement in the mediastinum or hilar regions. There  are small right axillary lymph nodes. However, these right axillary lymph nodes have clearly enlarged from the exam in 2017. Index lymph node measures up to 8 mm on sequence 1, image 21 and this lymph node was barely perceptible on the prior examination. Mild lymphadenopathy in the right axilla is nonspecific. No significant left axillary lymph node enlargement. Lungs/Pleura: Trachea and mainstem bronchi are patent. Dependent atelectasis in both lower lobes. Again noted are small scattered pulmonary nodules. Index lesion in the right middle lobe on sequence 4, image 46 measures 4 mm and stable. There are additional small scattered pulmonary nodules predominantly along the pleural surfaces. Majority of these small pulmonary nodules are stable. Limited evaluation of the lung bases due to the atelectasis. No large pleural effusions. Question a new 2 mm nodule in the periphery of the right lower lobe on sequence 4, image 51. Musculoskeletal: No acute bone abnormality. Review of the MIP  images confirms the above findings. CT ABDOMEN and PELVIS FINDINGS Hepatobiliary: Diffuse low-attenuation of the liver is compatible with steatosis. Gallbladder has been removed. Subtle low-density along periphery of the posterior right hepatic lobe on sequence 4, image 20 is nonspecific. There is mild intrahepatic biliary dilatation. Mild dilatation of the common bile duct measuring up to 9 mm on sequence 4, image 38. Large amount of edema and stranding in the porta hepatis. There is a calcification near the distal common bile duct and this is compatible with an obstructing bile duct stone. Pancreas: Extensive edema and inflammatory changes in the upper abdomen centered around the pancreas. Pancreatic enhancement is heterogeneous and compatible with edema and possibly areas of pancreatic necrosis. No discrete fluid or pseudocyst formations. No significant pancreatic duct dilatation. Spleen: Normal in size without focal abnormality. Adrenals/Urinary Tract: Normal adrenal glands. Stable appearance of the right kidney. Tiny hypodensities along the right kidney lower pole are suggestive for small cysts. Right extrarenal pelvis is unchanged without right hydronephrosis. Again noted is a exophytic cyst in the left kidney lower pole. There is a new area of decreased attenuation along the posterior left kidney lower pole region on sequence 4, image 50. The central aspect of the low-density area is hyperdense and may be enhancing. Based on the sagittal reformats, this could represent a renal infarct or focal pyelonephritis. Difficult to exclude an atypical lesion at this location. Negative for left hydronephrosis. Normal appearance of the urinary bladder. Indeterminate low-density structure in the medial left kidney on sequence 9 image 27 measures up to 1.0 cm. Stomach/Bowel: Diverticulosis in the sigmoid colon without colonic inflammation. Normal appearance stomach. No evidence for small or large bowel struck shin.  Calcification near the distal common bile duct is likely at the ampulla. 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 Main visceral arteries are patent. No abdominopelvic lymphadenopathy. Portal venous system is patent. Splenic vein is patent. Reproductive: Hysterectomy. New or enlarged low-density structure involving the right ovary on sequence 4, image 71. No evidence for a left adnexal lesion. Other: Large amount of edema in the central abdominal mesentery and around the duodenum and porta hepatis. No ascites in pelvis. Negative free air. Musculoskeletal: Focal sclerosis in the left ilium on sequence 4, image 69 is stable. No acute bone abnormality. Review of the MIP images confirms the above findings. IMPRESSION: 1. Acute pancreatitis likely secondary to an obstructive stone in the distal common bile duct near the ampulla. 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 No evidence for pseudocyst formations at time. Mild intrahepatic and  extrahepatic biliary dilatation. Subtle low-density along the posterior right hepatic lobe may be related to inflammation but this area is indeterminate. 2. Indeterminate left renal lesions. Unusual lesion or hypodensities in the posterior left kidney lower pole could represent areas of infarct, focal pyelonephritis or atypical neoplastic lesion. There is also a new indeterminate 1 cm lesion in the medial left kidney. Recommend follow-up renal MRI when patient is stable and can not tolerate an abdominal MRI. 3. Negative for pulmonary embolism.  Volume loss at the lung bases. 4. Chronic small pulmonary nodules. Index pulmonary nodule in right middle lobe has not significantly changed since 2017. 5. **An incidental finding of potential clinical significance has been found. Mildly enlarged right axillary lymph nodes of uncertain etiology. These lymph nodes are small but have enlarged since 2017.** 6. **An incidental  finding of potential clinical significance has been found. 2.5 cm low-density structure in the right ovary. This could represent an ovarian or adnexal cyst but atypical for a patient of this age. Recommend follow-up pelvic ultrasound.** These results were called by telephone at the time of interpretation on 12/13/2019 at 4:25 pm to provider [facility] , who verbally acknowledged these results. Electronically Signed   By: Markus Daft M.D.   On: 12/13/2019 16:35   CT ABDOMEN PELVIS W CONTRAST  Result Date: 12/13/2019 CLINICAL DATA:  74 year old female with lower abdominal pain. Shortness of breath. EXAM: CT ANGIOGRAPHY CHEST CT ABDOMEN AND PELVIS WITH CONTRAST TECHNIQUE: Multidetector CT imaging of the chest was performed using the standard protocol during bolus administration of intravenous contrast. Multiplanar CT image reconstructions and MIPs were obtained to evaluate the vascular anatomy. Multidetector CT imaging of the abdomen and pelvis was performed using the standard protocol during bolus administration of intravenous contrast. CONTRAST:  46mL OMNIPAQUE IOHEXOL 350 MG/ML SOLN COMPARISON:  Chest CT 01/11/2016 and CT abdomen 12/02/2013 FINDINGS: CTA CHEST FINDINGS Cardiovascular: Negative for pulmonary embolism. Normal caliber of the thoracic aorta. Heart size is within normal limits. No significant pericardial fluid. Great vessels patent. Mediastinum/Nodes: Normal appearance of mediastinal structures. No significant node enlargement in the mediastinum or hilar regions. There are small right axillary lymph nodes. However, these right axillary lymph nodes have clearly enlarged from the exam in 2017. Index lymph node measures up to 8 mm on sequence 1, image 21 and this lymph node was barely perceptible on the prior examination. Mild lymphadenopathy in the right axilla is nonspecific. No significant left axillary lymph node enlargement. Lungs/Pleura: Trachea and mainstem bronchi are patent. Dependent atelectasis  in both lower lobes. Again noted are small scattered pulmonary nodules. Index lesion in the right middle lobe on sequence 4, image 46 measures 4 mm and stable. There are additional small scattered pulmonary nodules predominantly along the pleural surfaces. Majority of these small pulmonary nodules are stable. Limited evaluation of the lung bases due to the atelectasis. No large pleural effusions. Question a new 2 mm nodule in the periphery of the right lower lobe on sequence 4, image 51. Musculoskeletal: No acute bone abnormality. Review of the MIP images confirms the above findings. CT ABDOMEN and PELVIS FINDINGS Hepatobiliary: Diffuse low-attenuation of the liver is compatible with steatosis. Gallbladder has been removed. Subtle low-density along periphery of the posterior right hepatic lobe on sequence 4, image 20 is nonspecific. There is mild intrahepatic biliary dilatation. Mild dilatation of the common bile duct measuring up to 9 mm on sequence 4, image 38. Large amount of edema and stranding in the porta hepatis. There is a  calcification near the distal common bile duct and this is compatible with an obstructing bile duct stone. Pancreas: Extensive edema and inflammatory changes in the upper abdomen centered around the pancreas. Pancreatic enhancement is heterogeneous and compatible with edema and possibly areas of pancreatic necrosis. No discrete fluid or pseudocyst formations. No significant pancreatic duct dilatation. Spleen: Normal in size without focal abnormality. Adrenals/Urinary Tract: Normal adrenal glands. Stable appearance of the right kidney. Tiny hypodensities along the right kidney lower pole are suggestive for small cysts. Right extrarenal pelvis is unchanged without right hydronephrosis. Again noted is a exophytic cyst in the left kidney lower pole. There is a new area of decreased attenuation along the posterior left kidney lower pole region on sequence 4, image 50. The central aspect of the  low-density area is hyperdense and may be enhancing. Based on the sagittal reformats, this could represent a renal infarct or focal pyelonephritis. Difficult to exclude an atypical lesion at this location. Negative for left hydronephrosis. Normal appearance of the urinary bladder. Indeterminate low-density structure in the medial left kidney on sequence 9 image 27 measures up to 1.0 cm. Stomach/Bowel: Diverticulosis in the sigmoid colon without colonic inflammation. Normal appearance stomach. No evidence for small or large bowel struck shin. Calcification near the distal common bile duct is likely at the ampulla. 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 Main visceral arteries are patent. No abdominopelvic lymphadenopathy. Portal venous system is patent. Splenic vein is patent. Reproductive: Hysterectomy. New or enlarged low-density structure involving the right ovary on sequence 4, image 71. No evidence for a left adnexal lesion. Other: Large amount of edema in the central abdominal mesentery and around the duodenum and porta hepatis. No ascites in pelvis. Negative free air. Musculoskeletal: Focal sclerosis in the left ilium on sequence 4, image 69 is stable. No acute bone abnormality. Review of the MIP images confirms the above findings. IMPRESSION: 1. Acute pancreatitis likely secondary to an obstructive stone in the distal common bile duct near the ampulla. 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 No evidence for pseudocyst formations at time. Mild intrahepatic and extrahepatic biliary dilatation. Subtle low-density along the posterior right hepatic lobe may be related to inflammation but this area is indeterminate. 2. Indeterminate left renal lesions. Unusual lesion or hypodensities in the posterior left kidney lower pole could represent areas of infarct, focal pyelonephritis or atypical neoplastic lesion. There is also a new indeterminate  1 cm lesion in the medial left kidney. Recommend follow-up renal MRI when patient is stable and can not tolerate an abdominal MRI. 3. Negative for pulmonary embolism.  Volume loss at the lung bases. 4. Chronic small pulmonary nodules. Index pulmonary nodule in right middle lobe has not significantly changed since 2017. 5. **An incidental finding of potential clinical significance has been found. Mildly enlarged right axillary lymph nodes of uncertain etiology. These lymph nodes are small but have enlarged since 2017.** 6. **An incidental finding of potential clinical significance has been found. 2.5 cm low-density structure in the right ovary. This could represent an ovarian or adnexal cyst but atypical for a patient of this age. Recommend follow-up pelvic ultrasound.** These results were called by telephone at the time of interpretation on 12/13/2019 at 4:25 pm to provider [facility] , who verbally acknowledged these results. Electronically Signed   By: Markus Daft M.D.   On: 12/13/2019 16:35    ROS:  As stated above in the HPI otherwise negative.  Blood pressure 111/86, pulse Marland Kitchen)  131, temperature (!) 97.4 F (36.3 C), temperature source Oral, resp. rate 20, height 5' 0.98" (1.549 m), weight 78.3 kg, SpO2 96 %.    PE: Gen: NAD, Alert and Oriented HEENT:  [place]/AT, EOMI Neck: Supple, no LAD Lungs: CTA Bilaterally CV: RRR without M/G/R ABM: Soft, mid abdominal tenderness, +BS Ext: No C/C/E  Assessment/Plan: 1) Gallstone pancreatitis. 2) ABM pain. 3) Fatty liver.   The patient is tachycardic and dry.  She needs to have more fluids as her HCT is at 50 and her creatinine is elevated.  Her EF from last July was at 55-60%.  She needs fluid hydration to treat her pancreatitis as well as an ERCP for stone extraction.  Plan: 1) LR bolus 1 liter and then maintenance dosing at 150 ml/hour.  She may require another bolus depending on her response. 2) ERCP at 12:30 PM today.  Reshanda Lewey D 12/14/2019, 8:03  AM

## 2019-12-14 NOTE — Op Note (Signed)
[facility] Patient Name: Katherine Oliver Procedure Date : 12/14/2019 MRN: [id number] Attending MD: [name] , MD Date of Birth: [birth year] CSN: [id number] Age: 74 Admit Type: Inpatient Procedure:                ERCP Indications:              Common bile duct stone(s) Providers:                [name], MD, Glori Bickers, RN, Lazaro Arms,                            Technician, Elspeth Cho Tech., Technician, Corie Chiquito, Technician Referring MD:              Medicines:                General Anesthesia Complications:            No immediate complications. Estimated Blood Loss:     Estimated blood loss: none. Procedure:                Pre-Anesthesia Assessment:                           - Prior to the procedure, a History and Physical                            was performed, and patient medications and                            allergies were reviewed. The patient's tolerance of                            previous anesthesia was also reviewed. The risks                            and benefits of the procedure and the sedation                            options and risks were discussed with the patient.                            All questions were answered, and informed consent                            was obtained. Prior Anticoagulants: The patient has                            taken no previous anticoagulant or antiplatelet                            agents. ASA Grade Assessment: III - A patient with                            severe  systemic disease. After reviewing the risks                            and benefits, the patient was deemed in                            satisfactory condition to undergo the procedure.                           - Sedation was administered by an anesthesia                            professional. General anesthesia was attained.                           After obtaining informed consent, the  scope was                            passed under direct vision. Throughout the                            procedure, the patient's blood pressure, pulse, and                            oxygen saturations were monitored continuously. The                            [id number]-[id number] ([id number]) Olympus duodenoscope was                            introduced through the mouth, and used to inject                            contrast into and used to inject contrast into the                            bile duct and dorsal pancreatic duct. The ERCP was                            technically difficult and complex. The patient                            tolerated the procedure well. Scope In: Scope Out: Findings:      A long 0.035 inch Soft Jagwire was passed into the biliary tree. A 5 mm       biliary sphincterotomy was made with a traction (standard)       sphincterotome using ERBE electrocautery. There was no       post-sphincterotomy bleeding. One 7 Fr by 5 cm plastic stent with a       single external flap and a single internal flap was placed 4 cm into the       common bile duct. Bile and clear fluid flowed through the stent. The       stent was in good position.      The  duodenum was extremely edematous. It was difficult to localize the       ampulla. A brief glimpse was obtained and the sphincterotome was able to       be advanced. Cannulation occurred in the distal PD. A two wire technique       was used and this easily facilitated the cannulation of the CBD. The       guidewire was secured in the right intrahepatic ducts. Contrast       injection did not show any overt evidence of a retained stone. The CBD       was mildly dilated at 8-9 mm. Because of the significant edema, and the       inability to safely visualize the mucosa around the ampulla, a very       small sphincterotomy was created. This was to facilitate placement of a       stent. A 7 Fr x 5 cm plastic biliary stent was inserted  without       difficulty. Excellent drainage was achieved. Impression:               - A biliary sphincterotomy was performed.                           - One plastic stent was placed into the common bile                            duct. Recommendation:           - Return patient to hospital ward for ongoing care.                           - Maintain NPO.                           - Aggressive IV hydration. Procedure Code(s):        --- Professional ---                           [id number], Endoscopic retrograde                            cholangiopancreatography (ERCP); with placement of                            endoscopic stent into biliary or pancreatic duct,                            including pre- and post-dilation and guide wire                            passage, when performed, including sphincterotomy,                            when performed, each stent Diagnosis Code(s):        --- Professional ---                           K80.50, Calculus of bile duct without cholangitis  or cholecystitis without obstruction CPT copyright 2019 [employer]. All rights reserved. The codes documented in this report are preliminary and upon coder review may  be revised to meet current compliance requirements. [name], MD [name], MD 12/14/2019 1:54:20 PM This report has been signed electronically. Number of Addenda: 0

## 2019-12-14 NOTE — Progress Notes (Signed)
Md made aware about pt's  difficulty to urinate , bladder scan done- obtained -0- 3x.gave order to continue with ivf. Continue to monitor.

## 2019-12-14 NOTE — Telephone Encounter (Signed)
Noted  

## 2019-12-15 ENCOUNTER — Inpatient Hospital Stay (HOSPITAL_COMMUNITY): Payer: Medicare Other

## 2019-12-15 DIAGNOSIS — N179 Acute kidney failure, unspecified: Secondary | ICD-10-CM

## 2019-12-15 DIAGNOSIS — E1122 Type 2 diabetes mellitus with diabetic chronic kidney disease: Secondary | ICD-10-CM

## 2019-12-15 DIAGNOSIS — J9600 Acute respiratory failure, unspecified whether with hypoxia or hypercapnia: Secondary | ICD-10-CM

## 2019-12-15 DIAGNOSIS — A419 Sepsis, unspecified organism: Principal | ICD-10-CM

## 2019-12-15 DIAGNOSIS — R6520 Severe sepsis without septic shock: Secondary | ICD-10-CM

## 2019-12-15 DIAGNOSIS — N183 Chronic kidney disease, stage 3 unspecified: Secondary | ICD-10-CM

## 2019-12-15 DIAGNOSIS — K8050 Calculus of bile duct without cholangitis or cholecystitis without obstruction: Secondary | ICD-10-CM

## 2019-12-15 LAB — BLOOD GAS, ARTERIAL
Acid-base deficit: 6.7 mmol/L — ABNORMAL HIGH (ref 0.0–2.0)
Bicarbonate: 20.1 mmol/L (ref 20.0–28.0)
Drawn by: 535471
FIO2: 44
O2 Saturation: 86.6 %
Patient temperature: 36.5
pCO2 arterial: 53.1 mmHg — ABNORMAL HIGH (ref 32.0–48.0)
pH, Arterial: 7.2 — ABNORMAL LOW (ref 7.350–7.450)
pO2, Arterial: 56 mmHg — ABNORMAL LOW (ref 83.0–108.0)

## 2019-12-15 LAB — URINALYSIS, ROUTINE W REFLEX MICROSCOPIC
Bilirubin Urine: NEGATIVE
Glucose, UA: 50 mg/dL — AB
Ketones, ur: 5 mg/dL — AB
Leukocytes,Ua: NEGATIVE
Nitrite: NEGATIVE
Protein, ur: 100 mg/dL — AB
Specific Gravity, Urine: 1.035 — ABNORMAL HIGH (ref 1.005–1.030)
pH: 5 (ref 5.0–8.0)

## 2019-12-15 LAB — COMPREHENSIVE METABOLIC PANEL
ALT: 268 U/L — ABNORMAL HIGH (ref 0–44)
AST: 421 U/L — ABNORMAL HIGH (ref 15–41)
Albumin: 3.4 g/dL — ABNORMAL LOW (ref 3.5–5.0)
Alkaline Phosphatase: 63 U/L (ref 38–126)
Anion gap: 14 (ref 5–15)
BUN: 47 mg/dL — ABNORMAL HIGH (ref 8–23)
CO2: 21 mmol/L — ABNORMAL LOW (ref 22–32)
Calcium: 8.6 mg/dL — ABNORMAL LOW (ref 8.9–10.3)
Chloride: 103 mmol/L (ref 98–111)
Creatinine, Ser: 4.29 mg/dL — ABNORMAL HIGH (ref 0.44–1.00)
GFR calc Af Amer: 11 mL/min — ABNORMAL LOW (ref >60)
GFR calc non Af Amer: 9 mL/min — ABNORMAL LOW (ref >60)
Glucose, Bld: 183 mg/dL — ABNORMAL HIGH (ref 70–99)
Potassium: 5.7 mmol/L — ABNORMAL HIGH (ref 3.5–5.1)
Sodium: 138 mmol/L (ref 135–145)
Total Bilirubin: 1.6 mg/dL — ABNORMAL HIGH (ref 0.3–1.2)
Total Protein: 6.1 g/dL — ABNORMAL LOW (ref 6.5–8.1)

## 2019-12-15 LAB — MRSA PCR SCREENING: MRSA by PCR: NEGATIVE

## 2019-12-15 LAB — BASIC METABOLIC PANEL
Anion gap: 14 (ref 5–15)
BUN: 58 mg/dL — ABNORMAL HIGH (ref 8–23)
CO2: 21 mmol/L — ABNORMAL LOW (ref 22–32)
Calcium: 8.3 mg/dL — ABNORMAL LOW (ref 8.9–10.3)
Chloride: 103 mmol/L (ref 98–111)
Creatinine, Ser: 5.33 mg/dL — ABNORMAL HIGH (ref 0.44–1.00)
GFR calc Af Amer: 8 mL/min — ABNORMAL LOW (ref >60)
GFR calc non Af Amer: 7 mL/min — ABNORMAL LOW (ref >60)
Glucose, Bld: 189 mg/dL — ABNORMAL HIGH (ref 70–99)
Potassium: 5.4 mmol/L — ABNORMAL HIGH (ref 3.5–5.1)
Sodium: 138 mmol/L (ref 135–145)

## 2019-12-15 LAB — HEMOGLOBIN A1C
Hgb A1c MFr Bld: 6.4 % — ABNORMAL HIGH (ref 4.8–5.6)
Mean Plasma Glucose: 137 mg/dL

## 2019-12-15 LAB — GLUCOSE, CAPILLARY
Glucose-Capillary: 154 mg/dL — ABNORMAL HIGH (ref 70–99)
Glucose-Capillary: 155 mg/dL — ABNORMAL HIGH (ref 70–99)
Glucose-Capillary: 156 mg/dL — ABNORMAL HIGH (ref 70–99)
Glucose-Capillary: 160 mg/dL — ABNORMAL HIGH (ref 70–99)
Glucose-Capillary: 166 mg/dL — ABNORMAL HIGH (ref 70–99)
Glucose-Capillary: 174 mg/dL — ABNORMAL HIGH (ref 70–99)
Glucose-Capillary: 179 mg/dL — ABNORMAL HIGH (ref 70–99)

## 2019-12-15 LAB — CBC
HCT: 38.7 % (ref 36.0–46.0)
Hemoglobin: 12 g/dL (ref 12.0–15.0)
MCH: 27.4 pg (ref 26.0–34.0)
MCHC: 31 g/dL (ref 30.0–36.0)
MCV: 88.4 fL (ref 80.0–100.0)
Platelets: 151 10*3/uL (ref 150–400)
RBC: 4.38 MIL/uL (ref 3.87–5.11)
RDW: 14.3 % (ref 11.5–15.5)
WBC: 17.9 10*3/uL — ABNORMAL HIGH (ref 4.0–10.5)
nRBC: 0.1 % (ref 0.0–0.2)

## 2019-12-15 LAB — SODIUM, URINE, RANDOM: Sodium, Ur: 27 mmol/L

## 2019-12-15 LAB — LACTIC ACID, PLASMA: Lactic Acid, Venous: 2.2 mmol/L (ref 0.5–1.9)

## 2019-12-15 LAB — MAGNESIUM: Magnesium: 1.5 mg/dL — ABNORMAL LOW (ref 1.7–2.4)

## 2019-12-15 LAB — LIPASE, BLOOD: Lipase: 2210 U/L — ABNORMAL HIGH (ref 11–51)

## 2019-12-15 LAB — CREATININE, URINE, RANDOM: Creatinine, Urine: 103.47 mg/dL

## 2019-12-15 LAB — BRAIN NATRIURETIC PEPTIDE: B Natriuretic Peptide: 65.1 pg/mL (ref 0.0–100.0)

## 2019-12-15 MED ORDER — SODIUM CHLORIDE 0.9% FLUSH: 9.0000 mL | INTRAVENOUS | Status: DC | PRN

## 2019-12-15 MED ORDER — PIPERACILLIN-TAZOBACTAM 3.375 G IVPB: 3.3750 g | Freq: Four times a day (QID) | INTRAVENOUS | Status: DC

## 2019-12-15 MED ORDER — PRISMASOL BGK 4/2.5 32-4-2.5 MEQ/L IV SOLN: INTRAVENOUS | Status: DC

## 2019-12-15 MED ORDER — HEPARIN SODIUM (PORCINE) 1000 UNIT/ML DIALYSIS
1000.0000 [IU] | INTRAMUSCULAR | Status: DC | PRN
  Administered 2019-12-16 – 2019-12-17 (×2): 3000 [IU] via INTRAVENOUS_CENTRAL
  Administered 2019-12-19: 2400 [IU] via INTRAVENOUS_CENTRAL
  Filled 2019-12-15 (×3): qty 6
  Filled 2019-12-15 (×2): qty 4
  Filled 2019-12-15: qty 6

## 2019-12-15 MED ORDER — HEPARIN SODIUM (PORCINE) 5000 UNIT/ML IJ SOLN
3000.0000 [IU] | Freq: Once | INTRAMUSCULAR | Status: AC
  Administered 2019-12-15: 2400 [IU] via INTRAVENOUS

## 2019-12-15 MED ORDER — SODIUM CHLORIDE 0.9% FLUSH
10.0000 mL | Freq: Two times a day (BID) | INTRAVENOUS | Status: DC
  Administered 2019-12-15 – 2020-01-01 (×11): 10 mL

## 2019-12-15 MED ORDER — PIPERACILLIN-TAZOBACTAM IN DEX 2-0.25 GM/50ML IV SOLN
2.2500 g | Freq: Four times a day (QID) | INTRAVENOUS | Status: DC
  Administered 2019-12-15: 2.25 g via INTRAVENOUS
  Filled 2019-12-15 (×3): qty 50

## 2019-12-15 MED ORDER — MAGNESIUM SULFATE 2 GM/50ML IV SOLN
2.0000 g | Freq: Once | INTRAVENOUS | Status: AC
  Administered 2019-12-15: 2 g via INTRAVENOUS
  Filled 2019-12-15: qty 50

## 2019-12-15 MED ORDER — SODIUM CHLORIDE 0.9 % FOR CRRT
INTRAVENOUS_CENTRAL | Status: DC | PRN
  Filled 2019-12-15: qty 1000

## 2019-12-15 MED ORDER — HYDROMORPHONE 1 MG/ML IV SOLN
INTRAVENOUS | Status: DC
  Administered 2019-12-15: 2 mg via INTRAVENOUS
  Administered 2019-12-15: 30 mg via INTRAVENOUS
  Filled 2019-12-15: qty 30

## 2019-12-15 MED ORDER — PRISMASOL BGK 4/2.5 32-4-2.5 MEQ/L REPLACEMENT SOLN: Status: DC

## 2019-12-15 MED ORDER — HYDROMORPHONE HCL 1 MG/ML IJ SOLN
0.5000 mg | INTRAMUSCULAR | Status: DC | PRN
  Administered 2019-12-15 – 2019-12-16 (×3): 0.5 mg via INTRAVENOUS
  Filled 2019-12-15 (×3): qty 0.5

## 2019-12-15 MED ORDER — NALOXONE HCL 0.4 MG/ML IJ SOLN: 0.4000 mg | INTRAMUSCULAR | Status: DC | PRN

## 2019-12-15 MED ORDER — SODIUM CHLORIDE 0.9% FLUSH
10.0000 mL | INTRAVENOUS | Status: DC | PRN
  Administered 2019-12-17: 08:00:00 20 mL
  Administered 2019-12-24: 04:00:00 10 mL

## 2019-12-15 MED ORDER — PIPERACILLIN-TAZOBACTAM 3.375 G IVPB 30 MIN
3.3750 g | Freq: Four times a day (QID) | INTRAVENOUS | Status: DC
  Administered 2019-12-15 – 2019-12-17 (×6): 3.375 g via INTRAVENOUS
  Filled 2019-12-15 (×14): qty 50

## 2019-12-15 MED ORDER — DIPHENHYDRAMINE HCL 50 MG/ML IJ SOLN: 12.5000 mg | Freq: Four times a day (QID) | INTRAMUSCULAR | Status: DC | PRN

## 2019-12-15 MED ORDER — HEPARIN SODIUM (PORCINE) 5000 UNIT/ML IJ SOLN
5000.0000 [IU] | Freq: Three times a day (TID) | INTRAMUSCULAR | Status: DC
  Administered 2019-12-15 – 2019-12-18 (×10): 5000 [IU] via SUBCUTANEOUS
  Filled 2019-12-15 (×10): qty 1

## 2019-12-15 MED ORDER — CHLORHEXIDINE GLUCONATE CLOTH 2 % EX PADS
6.0000 | MEDICATED_PAD | Freq: Every day | CUTANEOUS | Status: DC
  Administered 2019-12-15 – 2020-01-03 (×18): 6 via TOPICAL

## 2019-12-15 MED ORDER — DIPHENHYDRAMINE HCL 12.5 MG/5ML PO ELIX: 12.5000 mg | ORAL_SOLUTION | Freq: Four times a day (QID) | ORAL | Status: DC | PRN

## 2019-12-15 MED ORDER — ONDANSETRON HCL 4 MG/2ML IJ SOLN: 4.0000 mg | Freq: Four times a day (QID) | INTRAMUSCULAR | Status: DC | PRN

## 2019-12-15 MED ORDER — SODIUM ZIRCONIUM CYCLOSILICATE 10 G PO PACK
10.0000 g | PACK | ORAL | Status: AC
  Administered 2019-12-15 (×2): 10 g via ORAL
  Filled 2019-12-15 (×2): qty 1

## 2019-12-15 MED ORDER — LIDOCAINE HCL URETHRAL/MUCOSAL 2 % EX GEL
1.0000 "application " | Freq: Once | CUTANEOUS | Status: AC
  Administered 2019-12-15: 1 via TOPICAL
  Filled 2019-12-15: qty 20

## 2019-12-15 NOTE — Progress Notes (Addendum)
Pt through out the day has increase need for oxygen and after her renal scan has increase shortness of breath.  Notified MD.  ABG ordered and Critical care NP consult in.  orders received and Pt transferred to [facility], to be monitored and interventions.

## 2019-12-15 NOTE — Progress Notes (Signed)
PCA stopped due to patient's current condition. 27cc of Dilaudid wasted in [facility] at [facility].

## 2019-12-15 NOTE — Progress Notes (Signed)
PHARMACY NOTE:  ANTIMICROBIAL RENAL DOSAGE ADJUSTMENT  Current antimicrobial regimen includes a mismatch between antimicrobial dosage and estimated renal function.  As per policy approved by the Pharmacy & Therapeutics and Medical Executive Committees, the antimicrobial dosage will be adjusted accordingly.  Current antimicrobial dosage:  Zosyn 2.25gm IV Q6H  Indication: rule out UTI, intra-abdominal process and new early ARDS  Renal Function:  Estimated Creatinine Clearance: 10.7 mL/min (A) (by C-G formula based on SCr of 4.29 mg/dL (H)). []      On intermittent HD, scheduled: [x]      On CRRT    Antimicrobial dosage has been changed to:  Zosyn 3.375gm IV Q6H  Katherine Oliver D. Mina Marble, PharmD, BCPS, [place] 12/15/2019, 5:16 PM

## 2019-12-15 NOTE — Plan of Care (Signed)

## 2019-12-15 NOTE — Consult Note (Signed)
[facility] Progress Note    Assessment/ Plan:   1. Sepsis 2/2 Acute Gallstone Pancreatitis: Noted to have lactic acidosis and totally bili of 2, and lipase of 5200. CT abdomen notable for possible pancreatic necrosis with no pseduocyst, evidence of obstructing bile duct stone near the ampulla and intra and extra hepatic dilatation. GI consulted.  S/p ERCP by GI on 2/10 - biliary sphincterotomy + stent placement into common bile duct. She is being treated with IV fluids and anaglesics. Lipase, AST, and ALT are trending down appropriately. Abdominal distention with x-ray notable for ileus. - decrease mIVF to 50cc/hr given anuresis - NPO - Per GI and primary - PCA for pain - monitor lactic acid  2. AoCKD IIIa with aneuresis: Cr up trending during admission 1.09>1.57>1.83>4.29 (Baseline 0.8-1.2). Concern for sepsis induced AKI +/- contrast induced nephropathy (s/p CTA on admission) with possible development of ATN. Most concerning differential includes compartment syndrome of the abdomen leading to compression of the renal vein. This is supported with increased bladder pressure of 30 (indicating intraabdominal pressure of 30). This is consistent with her distended, tender, and firm abdomen. Patient only on Zosyn (no vanc) thus unlikely nephrotoxicity from this. UOP overnight 93mL. Euvolemic on exam. K at 5.7, Na 138, Ca 8.6, BUN 49. Will give Lokelma 10mg  x 2 and recheck BMP at 1500. If Cr and K continue to rise without production of urine, will likely need temporary dialysis catheter and immediate dialysis. - follow up UA, FeNA - monitor I/O's -foley catheter in place - renal ultrasound to r/o obstruction - continue IVF at 50cc/hr  - avoid nephrotoxic drugs - surgical team to evaluate more accurate intraabdominal pressure to determine need for surgical decompression   3. Acute Hypoxia  Sinus Tachycardia: likely secondary to sepsis and acute pain. CTA chest negative for PE -  continue 3L O2  4. UTI + Enterococcus: Currently on Zosyn.  - per primary    5. T2DM: per primary - SSI, lantus - hold metformin  6. HTN: BP 140-150/60-80's, Home meds: Amlodipine 10mg , Benzapril 20mg  QD, HCTZ 12.5mg  QD - hold home meds   7. HLD: hold statin   8. Dispo: pending workup  HPI:   Katherine Oliver is a 74 y.o. female presenting to [facility] for abdominal pain and watery diarrhea secondary to acute gallstone pancreatitis. She was found to be septic with elevated lactic acid to 5 and was transferred to [facility] for GI consult.  Patient has a PMH of HTN, T2DM, CKD stage IIIa, OA, and recurrent UTI's, and history of cholecystectomy.  On presentation, patient was septic with leucocytosis and tachycardic and was noted to have elevated lactic acid acid to 5, elevated LFT's, totally bili of 2, and lipase of 5200. CT abdomen notable for possible pancreatic necrosis with no pseduocyst, evidence of obstructing bile duct stone near the ampulla and intra and extra hepatic dilatation.   Patient was seen by urologist on 2/8 for dysuria, urgency, and frequency and was found to have UA significant for UTI. She was started on Macrobid.    Objective:   BP (!) 145/81 (BP Location: Left Arm)   Pulse (!) 125   Temp 98.1 F (36.7 C) (Oral)   Resp (!) 22   Ht 5' 0.98" (1.549 m)   Wt 78.3 kg   SpO2 92%   BMI 32.63 kg/m   Intake/Output Summary (Last 24 hours) at 12/15/2019 1010 Last data filed at 12/15/2019 0500 Gross per 24 hour  Intake 2731.95 ml  Output 75 ml  Net 2656.95 ml   Weight change:   Physical Exam: General: ill appearing, lying in bed with husband at bed side, in no acute distress with non-toxic appearance CV: 2/6 murmur appreciated best heard over left upper sternal border, tachycardia, no lower extremity edema Lungs: rhonchi on exam, normal work of breathing on 3L O2 Abdomen: distended, firm, and tender to palpation, normoactive bowel sounds Skin: warm, moist skin   Extremities: warm and well perfused Neuro: Alert and oriented, speech normal  Imaging: DG Abd 1 View  Result Date: 12/14/2019 CLINICAL DATA:  74 year old with acute pancreatitis and acute onset of abdominal distention. EXAM: ABDOMEN - 1 VIEW COMPARISON:  CT abdomen and pelvis yesterday. FINDINGS: Since the CT yesterday, development of borderline to mild gaseous distension of small bowel throughout the abdomen. Gas within normal caliber ascending colon. Remainder of the colon decompressed as on yesterday's CT. Residual contrast material in the renal parenchyma bilaterally and in the urinary bladder related to yesterday's CT, query acute kidney injury. No suggestion of free air on the supine image. Surgical clips in the RIGHT UPPER QUADRANT from prior cholecystectomy. IMPRESSION: 1. Mild generalized ileus. No evidence of bowel obstruction currently. 2. Residual contrast material in the renal parenchyma bilaterally and in the urinary bladder related to yesterday's CT, query acute kidney injury/renal insufficiency. Electronically Signed   By: Evangeline Dakin M.D.   On: 12/14/2019 09:37   CT Angio Chest PE W and/or Wo Contrast  Result Date: 12/13/2019 CLINICAL DATA:  74 year old female with lower abdominal pain. Shortness of breath. EXAM: CT ANGIOGRAPHY CHEST CT ABDOMEN AND PELVIS WITH CONTRAST TECHNIQUE: Multidetector CT imaging of the chest was performed using the standard protocol during bolus administration of intravenous contrast. Multiplanar CT image reconstructions and MIPs were obtained to evaluate the vascular anatomy. Multidetector CT imaging of the abdomen and pelvis was performed using the standard protocol during bolus administration of intravenous contrast. CONTRAST:  32mL OMNIPAQUE IOHEXOL 350 MG/ML SOLN COMPARISON:  Chest CT 01/11/2016 and CT abdomen 12/02/2013 FINDINGS: CTA CHEST FINDINGS Cardiovascular: Negative for pulmonary embolism. Normal caliber of the thoracic aorta. Heart size is  within normal limits. No significant pericardial fluid. Great vessels patent. Mediastinum/Nodes: Normal appearance of mediastinal structures. No significant node enlargement in the mediastinum or hilar regions. There are small right axillary lymph nodes. However, these right axillary lymph nodes have clearly enlarged from the exam in 2017. Index lymph node measures up to 8 mm on sequence 1, image 21 and this lymph node was barely perceptible on the prior examination. Mild lymphadenopathy in the right axilla is nonspecific. No significant left axillary lymph node enlargement. Lungs/Pleura: Trachea and mainstem bronchi are patent. Dependent atelectasis in both lower lobes. Again noted are small scattered pulmonary nodules. Index lesion in the right middle lobe on sequence 4, image 46 measures 4 mm and stable. There are additional small scattered pulmonary nodules predominantly along the pleural surfaces. Majority of these small pulmonary nodules are stable. Limited evaluation of the lung bases due to the atelectasis. No large pleural effusions. Question a new 2 mm nodule in the periphery of the right lower lobe on sequence 4, image 51. Musculoskeletal: No acute bone abnormality. Review of the MIP images confirms the above findings. CT ABDOMEN and PELVIS FINDINGS Hepatobiliary: Diffuse low-attenuation of the liver is compatible with steatosis. Gallbladder has been removed. Subtle low-density along periphery of the posterior right hepatic lobe on sequence 4, image 20 is nonspecific. There is mild intrahepatic biliary dilatation. Mild  dilatation of the common bile duct measuring up to 9 mm on sequence 4, image 38. Large amount of edema and stranding in the porta hepatis. There is a calcification near the distal common bile duct and this is compatible with an obstructing bile duct stone. Pancreas: Extensive edema and inflammatory changes in the upper abdomen centered around the pancreas. Pancreatic enhancement is  heterogeneous and compatible with edema and possibly areas of pancreatic necrosis. No discrete fluid or pseudocyst formations. No significant pancreatic duct dilatation. Spleen: Normal in size without focal abnormality. Adrenals/Urinary Tract: Normal adrenal glands. Stable appearance of the right kidney. Tiny hypodensities along the right kidney lower pole are suggestive for small cysts. Right extrarenal pelvis is unchanged without right hydronephrosis. Again noted is a exophytic cyst in the left kidney lower pole. There is a new area of decreased attenuation along the posterior left kidney lower pole region on sequence 4, image 50. The central aspect of the low-density area is hyperdense and may be enhancing. Based on the sagittal reformats, this could represent a renal infarct or focal pyelonephritis. Difficult to exclude an atypical lesion at this location. Negative for left hydronephrosis. Normal appearance of the urinary bladder. Indeterminate low-density structure in the medial left kidney on sequence 9 image 27 measures up to 1.0 cm. Stomach/Bowel: Diverticulosis in the sigmoid colon without colonic inflammation. Normal appearance stomach. No evidence for small or large bowel struck shin. Calcification near the distal common bile duct is likely at the ampulla. 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 Main visceral arteries are patent. No abdominopelvic lymphadenopathy. Portal venous system is patent. Splenic vein is patent. Reproductive: Hysterectomy. New or enlarged low-density structure involving the right ovary on sequence 4, image 71. No evidence for a left adnexal lesion. Other: Large amount of edema in the central abdominal mesentery and around the duodenum and porta hepatis. No ascites in pelvis. Negative free air. Musculoskeletal: Focal sclerosis in the left ilium on sequence 4, image 69 is stable. No acute bone abnormality. Review of the MIP images confirms the  above findings. IMPRESSION: 1. Acute pancreatitis likely secondary to an obstructive stone in the distal common bile duct near the ampulla. 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 No evidence for pseudocyst formations at time. Mild intrahepatic and extrahepatic biliary dilatation. Subtle low-density along the posterior right hepatic lobe may be related to inflammation but this area is indeterminate. 2. Indeterminate left renal lesions. Unusual lesion or hypodensities in the posterior left kidney lower pole could represent areas of infarct, focal pyelonephritis or atypical neoplastic lesion. There is also a new indeterminate 1 cm lesion in the medial left kidney. Recommend follow-up renal MRI when patient is stable and can not tolerate an abdominal MRI. 3. Negative for pulmonary embolism.  Volume loss at the lung bases. 4. Chronic small pulmonary nodules. Index pulmonary nodule in right middle lobe has not significantly changed since 2017. 5. **An incidental finding of potential clinical significance has been found. Mildly enlarged right axillary lymph nodes of uncertain etiology. These lymph nodes are small but have enlarged since 2017.** 6. **An incidental finding of potential clinical significance has been found. 2.5 cm low-density structure in the right ovary. This could represent an ovarian or adnexal cyst but atypical for a patient of this age. Recommend follow-up pelvic ultrasound.** These results were called by telephone at the time of interpretation on 12/13/2019 at 4:25 pm to provider [facility] , who verbally acknowledged these results. Electronically Signed  By: Markus Daft M.D.   On: 12/13/2019 16:35   CT ABDOMEN PELVIS W CONTRAST  Result Date: 12/13/2019 CLINICAL DATA:  74 year old female with lower abdominal pain. Shortness of breath. EXAM: CT ANGIOGRAPHY CHEST CT ABDOMEN AND PELVIS WITH CONTRAST TECHNIQUE: Multidetector CT imaging of the chest was performed using  the standard protocol during bolus administration of intravenous contrast. Multiplanar CT image reconstructions and MIPs were obtained to evaluate the vascular anatomy. Multidetector CT imaging of the abdomen and pelvis was performed using the standard protocol during bolus administration of intravenous contrast. CONTRAST:  60mL OMNIPAQUE IOHEXOL 350 MG/ML SOLN COMPARISON:  Chest CT 01/11/2016 and CT abdomen 12/02/2013 FINDINGS: CTA CHEST FINDINGS Cardiovascular: Negative for pulmonary embolism. Normal caliber of the thoracic aorta. Heart size is within normal limits. No significant pericardial fluid. Great vessels patent. Mediastinum/Nodes: Normal appearance of mediastinal structures. No significant node enlargement in the mediastinum or hilar regions. There are small right axillary lymph nodes. However, these right axillary lymph nodes have clearly enlarged from the exam in 2017. Index lymph node measures up to 8 mm on sequence 1, image 21 and this lymph node was barely perceptible on the prior examination. Mild lymphadenopathy in the right axilla is nonspecific. No significant left axillary lymph node enlargement. Lungs/Pleura: Trachea and mainstem bronchi are patent. Dependent atelectasis in both lower lobes. Again noted are small scattered pulmonary nodules. Index lesion in the right middle lobe on sequence 4, image 46 measures 4 mm and stable. There are additional small scattered pulmonary nodules predominantly along the pleural surfaces. Majority of these small pulmonary nodules are stable. Limited evaluation of the lung bases due to the atelectasis. No large pleural effusions. Question a new 2 mm nodule in the periphery of the right lower lobe on sequence 4, image 51. Musculoskeletal: No acute bone abnormality. Review of the MIP images confirms the above findings. CT ABDOMEN and PELVIS FINDINGS Hepatobiliary: Diffuse low-attenuation of the liver is compatible with steatosis. Gallbladder has been removed.  Subtle low-density along periphery of the posterior right hepatic lobe on sequence 4, image 20 is nonspecific. There is mild intrahepatic biliary dilatation. Mild dilatation of the common bile duct measuring up to 9 mm on sequence 4, image 38. Large amount of edema and stranding in the porta hepatis. There is a calcification near the distal common bile duct and this is compatible with an obstructing bile duct stone. Pancreas: Extensive edema and inflammatory changes in the upper abdomen centered around the pancreas. Pancreatic enhancement is heterogeneous and compatible with edema and possibly areas of pancreatic necrosis. No discrete fluid or pseudocyst formations. No significant pancreatic duct dilatation. Spleen: Normal in size without focal abnormality. Adrenals/Urinary Tract: Normal adrenal glands. Stable appearance of the right kidney. Tiny hypodensities along the right kidney lower pole are suggestive for small cysts. Right extrarenal pelvis is unchanged without right hydronephrosis. Again noted is a exophytic cyst in the left kidney lower pole. There is a new area of decreased attenuation along the posterior left kidney lower pole region on sequence 4, image 50. The central aspect of the low-density area is hyperdense and may be enhancing. Based on the sagittal reformats, this could represent a renal infarct or focal pyelonephritis. Difficult to exclude an atypical lesion at this location. Negative for left hydronephrosis. Normal appearance of the urinary bladder. Indeterminate low-density structure in the medial left kidney on sequence 9 image 27 measures up to 1.0 cm. Stomach/Bowel: Diverticulosis in the sigmoid colon without colonic inflammation. Normal appearance stomach. No evidence  for small or large bowel struck shin. Calcification near the distal common bile duct is likely at the ampulla. 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 Main visceral arteries are  patent. No abdominopelvic lymphadenopathy. Portal venous system is patent. Splenic vein is patent. Reproductive: Hysterectomy. New or enlarged low-density structure involving the right ovary on sequence 4, image 71. No evidence for a left adnexal lesion. Other: Large amount of edema in the central abdominal mesentery and around the duodenum and porta hepatis. No ascites in pelvis. Negative free air. Musculoskeletal: Focal sclerosis in the left ilium on sequence 4, image 69 is stable. No acute bone abnormality. Review of the MIP images confirms the above findings. IMPRESSION: 1. Acute pancreatitis likely secondary to an obstructive stone in the distal common bile duct near the ampulla. 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 No evidence for pseudocyst formations at time. Mild intrahepatic and extrahepatic biliary dilatation. Subtle low-density along the posterior right hepatic lobe may be related to inflammation but this area is indeterminate. 2. Indeterminate left renal lesions. Unusual lesion or hypodensities in the posterior left kidney lower pole could represent areas of infarct, focal pyelonephritis or atypical neoplastic lesion. There is also a new indeterminate 1 cm lesion in the medial left kidney. Recommend follow-up renal MRI when patient is stable and can not tolerate an abdominal MRI. 3. Negative for pulmonary embolism.  Volume loss at the lung bases. 4. Chronic small pulmonary nodules. Index pulmonary nodule in right middle lobe has not significantly changed since 2017. 5. **An incidental finding of potential clinical significance has been found. Mildly enlarged right axillary lymph nodes of uncertain etiology. These lymph nodes are small but have enlarged since 2017.** 6. **An incidental finding of potential clinical significance has been found. 2.5 cm low-density structure in the right ovary. This could represent an ovarian or adnexal cyst but atypical for a patient  of this age. Recommend follow-up pelvic ultrasound.** These results were called by telephone at the time of interpretation on 12/13/2019 at 4:25 pm to provider [facility] , who verbally acknowledged these results. Electronically Signed   By: Markus Daft M.D.   On: 12/13/2019 16:35   DG Chest Port 1 View  Result Date: 12/14/2019 CLINICAL DATA:  73 year old with acute pancreatitis and shortness of breath. EXAM: PORTABLE CHEST 1 VIEW COMPARISON:  12/29/2018 and earlier. FINDINGS: Markedly suboptimal inspiration accounts for atelectasis in the lung bases, LEFT greater than RIGHT. Lungs otherwise clear. Pulmonary vascularity normal. Cardiac silhouette normal in size for AP portable technique and degree of inspiration. IMPRESSION: Markedly suboptimal inspiration accounts for bibasilar atelectasis, LEFT greater than RIGHT. Electronically Signed   By: Evangeline Dakin M.D.   On: 12/14/2019 09:38   DG ERCP BILIARY & PANCREATIC DUCTS  Result Date: 12/14/2019 CLINICAL DATA:  74 year old female with choledocholithiasis EXAM: ERCP TECHNIQUE: Multiple spot images obtained with the fluoroscopic device and submitted for interpretation post-procedure. FLUOROSCOPY TIME:  Fluoroscopy Time:  1 minutes 34 seconds Radiation Exposure Index (if provided by the fluoroscopic device): 24.6 mGy Number of Acquired Spot Images: 0 COMPARISON:  CT scan of the abdomen and pelvis 12/13/2019 FINDINGS: Two intraoperative saved images are submitted for review. The images demonstrate a flexible endoscope in the descending duodenum with wire cannulation of the common bile duct. The second image demonstrates placement of a plastic biliary stent. IMPRESSION: ERCP with placement of plastic biliary stent. These images were submitted for radiologic interpretation only. Please see the procedural report for the amount  of contrast and the fluoroscopy time utilized. Electronically Signed   By: Jacqulynn Cadet M.D.   On: 12/14/2019 14:37     Labs: BMET Recent Labs  Lab 12/13/19 1400 12/13/19 2230 12/14/19 0215 12/15/19 0309  NA 140 141 139 138  K 3.6 4.8 5.8* 5.7*  CL 101 103 102 103  CO2 22 18* 19* 21*  GLUCOSE 219* 265* 267* 183*  BUN 26* 25* 27* 47*  CREATININE 1.09* 1.57* 1.83* 4.29*  CALCIUM 10.3 9.8 9.7 8.6*  PHOS  --   --  5.2*  --    CBC Recent Labs  Lab 12/13/19 1400 12/14/19 0215 12/15/19 0309  WBC 26.4* 12.8* 17.9*  HGB 16.2* 15.6* 12.0  HCT 51.2* 50.6* 38.7  MCV 84.6 86.6 88.4  PLT 356 194 151    Medications:    . HYDROmorphone   Intravenous Q4H  . insulin aspart  0-9 Units Subcutaneous Q4H  . insulin glargine  7 Units Subcutaneous Daily     Mina Marble, DO [facility] Resident, PGY2 12/15/2019, 10:10 AM

## 2019-12-15 NOTE — Procedures (Signed)
Hemodialysis Catheter Insertion Procedure Note Katherine Oliver [id number] 1946-09-16  Procedure: Insertion of Hemodialysis Catheter Indications: Dialysis Access   Procedure Details Consent: Risks of procedure as well as the alternatives and risks of each were explained to the (patient/caregiver).  Consent for procedure obtained. Time Out: Verified patient identification, verified procedure, site/side was marked, verified correct patient position, special equipment/implants available, medications/allergies/relevent history reviewed, required imaging and test results available.  Performed Real time [place] used to ID and cannulate the vessel.  Maximum sterile technique was used including antiseptics, cap, gloves, gown, hand hygiene, mask and sheet. Skin prep: Chlorhexidine; local anesthetic administered Triple lumen hemodialysis catheter was inserted into right internal jugular vein using the Seldinger technique.  Evaluation Blood flow good Complications: No apparent complications Patient did tolerate procedure well. Chest X-ray ordered to verify placement.  CXR: pending.   Clementeen Graham 12/15/2019  Erick Colace ACNP-BC [place] Pager # [phone] OR # [phone] if no answer

## 2019-12-15 NOTE — Progress Notes (Signed)
Subjective: Complaining of pain, but mildly improved compared to admission.  Objective: Vital signs in last 24 hours: Temp:  [97.6 F (36.4 C)-99.7 F (37.6 C)] 97.6 F (36.4 C) (02/11 0424) Pulse Rate:  [109-135] 118 (02/11 0424) Resp:  [11-24] 17 (02/11 0424) BP: (136-183)/(41-92) 151/67 (02/11 0424) SpO2:  [91 %-97 %] 91 % (02/11 0424) Last BM Date: 12/13/19  Intake/Output from previous day: 02/10 0701 - 02/11 0700 In: 3357 [I.V.:2764.5; IV Piggyback:592.4] Out: 75 [Urine:75] Intake/Output this shift: Total I/O In: 1282 [I.V.:1189.5; IV Piggyback:92.4] Out: 25 [Urine:25]  General appearance: alert and mild distress Resp: clear to auscultation bilaterally Cardio: tachycardic GI: tender in the epigastric region Extremities: extremities normal, atraumatic, no cyanosis or edema  Lab Results: Recent Labs    12/13/19 1400 12/14/19 0215 12/15/19 0309  WBC 26.4* 12.8* 17.9*  HGB 16.2* 15.6* 12.0  HCT 51.2* 50.6* 38.7  PLT 356 194 151   BMET Recent Labs    12/13/19 2230 12/14/19 0215 12/15/19 0309  NA 141 139 138  K 4.8 5.8* 5.7*  CL 103 102 103  CO2 18* 19* 21*  GLUCOSE 265* 267* 183*  BUN 25* 27* 47*  CREATININE 1.57* 1.83* 4.29*  CALCIUM 9.8 9.7 8.6*   LFT Recent Labs    12/15/19 0309  PROT 6.1*  ALBUMIN 3.4*  AST 421*  ALT 268*  ALKPHOS 63  BILITOT 1.6*   PT/INR No results for input(s): LABPROT, INR in the last 72 hours. Hepatitis Panel No results for input(s): HEPBSAG, HCVAB, HEPAIGM, HEPBIGM in the last 72 hours. C-Diff No results for input(s): CDIFFTOX in the last 72 hours. Fecal Lactopherrin No results for input(s): FECLLACTOFRN in the last 72 hours.  Studies/Results: DG Abd 1 View  Result Date: 12/14/2019 CLINICAL DATA:  74 year old with acute pancreatitis and acute onset of abdominal distention. EXAM: ABDOMEN - 1 VIEW COMPARISON:  CT abdomen and pelvis yesterday. FINDINGS: Since the CT yesterday, development of borderline to mild  gaseous distension of small bowel throughout the abdomen. Gas within normal caliber ascending colon. Remainder of the colon decompressed as on yesterday's CT. Residual contrast material in the renal parenchyma bilaterally and in the urinary bladder related to yesterday's CT, query acute kidney injury. No suggestion of free air on the supine image. Surgical clips in the RIGHT UPPER QUADRANT from prior cholecystectomy. IMPRESSION: 1. Mild generalized ileus. No evidence of bowel obstruction currently. 2. Residual contrast material in the renal parenchyma bilaterally and in the urinary bladder related to yesterday's CT, query acute kidney injury/renal insufficiency. Electronically Signed   By: Evangeline Dakin M.D.   On: 12/14/2019 09:37   CT Angio Chest PE W and/or Wo Contrast  Result Date: 12/13/2019 CLINICAL DATA:  74 year old female with lower abdominal pain. Shortness of breath. EXAM: CT ANGIOGRAPHY CHEST CT ABDOMEN AND PELVIS WITH CONTRAST TECHNIQUE: Multidetector CT imaging of the chest was performed using the standard protocol during bolus administration of intravenous contrast. Multiplanar CT image reconstructions and MIPs were obtained to evaluate the vascular anatomy. Multidetector CT imaging of the abdomen and pelvis was performed using the standard protocol during bolus administration of intravenous contrast. CONTRAST:  33mL OMNIPAQUE IOHEXOL 350 MG/ML SOLN COMPARISON:  Chest CT 01/11/2016 and CT abdomen 12/02/2013 FINDINGS: CTA CHEST FINDINGS Cardiovascular: Negative for pulmonary embolism. Normal caliber of the thoracic aorta. Heart size is within normal limits. No significant pericardial fluid. Great vessels patent. Mediastinum/Nodes: Normal appearance of mediastinal structures. No significant node enlargement in the mediastinum or hilar regions. There are small  right axillary lymph nodes. However, these right axillary lymph nodes have clearly enlarged from the exam in 2017. Index lymph node measures  up to 8 mm on sequence 1, image 21 and this lymph node was barely perceptible on the prior examination. Mild lymphadenopathy in the right axilla is nonspecific. No significant left axillary lymph node enlargement. Lungs/Pleura: Trachea and mainstem bronchi are patent. Dependent atelectasis in both lower lobes. Again noted are small scattered pulmonary nodules. Index lesion in the right middle lobe on sequence 4, image 46 measures 4 mm and stable. There are additional small scattered pulmonary nodules predominantly along the pleural surfaces. Majority of these small pulmonary nodules are stable. Limited evaluation of the lung bases due to the atelectasis. No large pleural effusions. Question a new 2 mm nodule in the periphery of the right lower lobe on sequence 4, image 51. Musculoskeletal: No acute bone abnormality. Review of the MIP images confirms the above findings. CT ABDOMEN and PELVIS FINDINGS Hepatobiliary: Diffuse low-attenuation of the liver is compatible with steatosis. Gallbladder has been removed. Subtle low-density along periphery of the posterior right hepatic lobe on sequence 4, image 20 is nonspecific. There is mild intrahepatic biliary dilatation. Mild dilatation of the common bile duct measuring up to 9 mm on sequence 4, image 38. Large amount of edema and stranding in the porta hepatis. There is a calcification near the distal common bile duct and this is compatible with an obstructing bile duct stone. Pancreas: Extensive edema and inflammatory changes in the upper abdomen centered around the pancreas. Pancreatic enhancement is heterogeneous and compatible with edema and possibly areas of pancreatic necrosis. No discrete fluid or pseudocyst formations. No significant pancreatic duct dilatation. Spleen: Normal in size without focal abnormality. Adrenals/Urinary Tract: Normal adrenal glands. Stable appearance of the right kidney. Tiny hypodensities along the right kidney lower pole are suggestive  for small cysts. Right extrarenal pelvis is unchanged without right hydronephrosis. Again noted is a exophytic cyst in the left kidney lower pole. There is a new area of decreased attenuation along the posterior left kidney lower pole region on sequence 4, image 50. The central aspect of the low-density area is hyperdense and may be enhancing. Based on the sagittal reformats, this could represent a renal infarct or focal pyelonephritis. Difficult to exclude an atypical lesion at this location. Negative for left hydronephrosis. Normal appearance of the urinary bladder. Indeterminate low-density structure in the medial left kidney on sequence 9 image 27 measures up to 1.0 cm. Stomach/Bowel: Diverticulosis in the sigmoid colon without colonic inflammation. Normal appearance stomach. No evidence for small or large bowel struck shin. Calcification near the distal common bile duct is likely at the ampulla. 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 Main visceral arteries are patent. No abdominopelvic lymphadenopathy. Portal venous system is patent. Splenic vein is patent. Reproductive: Hysterectomy. New or enlarged low-density structure involving the right ovary on sequence 4, image 71. No evidence for a left adnexal lesion. Other: Large amount of edema in the central abdominal mesentery and around the duodenum and porta hepatis. No ascites in pelvis. Negative free air. Musculoskeletal: Focal sclerosis in the left ilium on sequence 4, image 69 is stable. No acute bone abnormality. Review of the MIP images confirms the above findings. IMPRESSION: 1. Acute pancreatitis likely secondary to an obstructive stone in the distal common bile duct near the ampulla. 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 No evidence for pseudocyst formations at time. Mild intrahepatic and extrahepatic biliary  dilatation. Subtle low-density along the posterior right hepatic  lobe may be related to inflammation but this area is indeterminate. 2. Indeterminate left renal lesions. Unusual lesion or hypodensities in the posterior left kidney lower pole could represent areas of infarct, focal pyelonephritis or atypical neoplastic lesion. There is also a new indeterminate 1 cm lesion in the medial left kidney. Recommend follow-up renal MRI when patient is stable and can not tolerate an abdominal MRI. 3. Negative for pulmonary embolism.  Volume loss at the lung bases. 4. Chronic small pulmonary nodules. Index pulmonary nodule in right middle lobe has not significantly changed since 2017. 5. **An incidental finding of potential clinical significance has been found. Mildly enlarged right axillary lymph nodes of uncertain etiology. These lymph nodes are small but have enlarged since 2017.** 6. **An incidental finding of potential clinical significance has been found. 2.5 cm low-density structure in the right ovary. This could represent an ovarian or adnexal cyst but atypical for a patient of this age. Recommend follow-up pelvic ultrasound.** These results were called by telephone at the time of interpretation on 12/13/2019 at 4:25 pm to provider [facility] , who verbally acknowledged these results. Electronically Signed   By: Markus Daft M.D.   On: 12/13/2019 16:35   CT ABDOMEN PELVIS W CONTRAST  Result Date: 12/13/2019 CLINICAL DATA:  74 year old female with lower abdominal pain. Shortness of breath. EXAM: CT ANGIOGRAPHY CHEST CT ABDOMEN AND PELVIS WITH CONTRAST TECHNIQUE: Multidetector CT imaging of the chest was performed using the standard protocol during bolus administration of intravenous contrast. Multiplanar CT image reconstructions and MIPs were obtained to evaluate the vascular anatomy. Multidetector CT imaging of the abdomen and pelvis was performed using the standard protocol during bolus administration of intravenous contrast. CONTRAST:  18mL OMNIPAQUE IOHEXOL 350 MG/ML SOLN  COMPARISON:  Chest CT 01/11/2016 and CT abdomen 12/02/2013 FINDINGS: CTA CHEST FINDINGS Cardiovascular: Negative for pulmonary embolism. Normal caliber of the thoracic aorta. Heart size is within normal limits. No significant pericardial fluid. Great vessels patent. Mediastinum/Nodes: Normal appearance of mediastinal structures. No significant node enlargement in the mediastinum or hilar regions. There are small right axillary lymph nodes. However, these right axillary lymph nodes have clearly enlarged from the exam in 2017. Index lymph node measures up to 8 mm on sequence 1, image 21 and this lymph node was barely perceptible on the prior examination. Mild lymphadenopathy in the right axilla is nonspecific. No significant left axillary lymph node enlargement. Lungs/Pleura: Trachea and mainstem bronchi are patent. Dependent atelectasis in both lower lobes. Again noted are small scattered pulmonary nodules. Index lesion in the right middle lobe on sequence 4, image 46 measures 4 mm and stable. There are additional small scattered pulmonary nodules predominantly along the pleural surfaces. Majority of these small pulmonary nodules are stable. Limited evaluation of the lung bases due to the atelectasis. No large pleural effusions. Question a new 2 mm nodule in the periphery of the right lower lobe on sequence 4, image 51. Musculoskeletal: No acute bone abnormality. Review of the MIP images confirms the above findings. CT ABDOMEN and PELVIS FINDINGS Hepatobiliary: Diffuse low-attenuation of the liver is compatible with steatosis. Gallbladder has been removed. Subtle low-density along periphery of the posterior right hepatic lobe on sequence 4, image 20 is nonspecific. There is mild intrahepatic biliary dilatation. Mild dilatation of the common bile duct measuring up to 9 mm on sequence 4, image 38. Large amount of edema and stranding in the porta hepatis. There is a calcification near  the distal common bile duct and  this is compatible with an obstructing bile duct stone. Pancreas: Extensive edema and inflammatory changes in the upper abdomen centered around the pancreas. Pancreatic enhancement is heterogeneous and compatible with edema and possibly areas of pancreatic necrosis. No discrete fluid or pseudocyst formations. No significant pancreatic duct dilatation. Spleen: Normal in size without focal abnormality. Adrenals/Urinary Tract: Normal adrenal glands. Stable appearance of the right kidney. Tiny hypodensities along the right kidney lower pole are suggestive for small cysts. Right extrarenal pelvis is unchanged without right hydronephrosis. Again noted is a exophytic cyst in the left kidney lower pole. There is a new area of decreased attenuation along the posterior left kidney lower pole region on sequence 4, image 50. The central aspect of the low-density area is hyperdense and may be enhancing. Based on the sagittal reformats, this could represent a renal infarct or focal pyelonephritis. Difficult to exclude an atypical lesion at this location. Negative for left hydronephrosis. Normal appearance of the urinary bladder. Indeterminate low-density structure in the medial left kidney on sequence 9 image 27 measures up to 1.0 cm. Stomach/Bowel: Diverticulosis in the sigmoid colon without colonic inflammation. Normal appearance stomach. No evidence for small or large bowel struck shin. Calcification near the distal common bile duct is likely at the ampulla. 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 Main visceral arteries are patent. No abdominopelvic lymphadenopathy. Portal venous system is patent. Splenic vein is patent. Reproductive: Hysterectomy. New or enlarged low-density structure involving the right ovary on sequence 4, image 71. No evidence for a left adnexal lesion. Other: Large amount of edema in the central abdominal mesentery and around the duodenum and porta hepatis. No  ascites in pelvis. Negative free air. Musculoskeletal: Focal sclerosis in the left ilium on sequence 4, image 69 is stable. No acute bone abnormality. Review of the MIP images confirms the above findings. IMPRESSION: 1. Acute pancreatitis likely secondary to an obstructive stone in the distal common bile duct near the ampulla. 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 No evidence for pseudocyst formations at time. Mild intrahepatic and extrahepatic biliary dilatation. Subtle low-density along the posterior right hepatic lobe may be related to inflammation but this area is indeterminate. 2. Indeterminate left renal lesions. Unusual lesion or hypodensities in the posterior left kidney lower pole could represent areas of infarct, focal pyelonephritis or atypical neoplastic lesion. There is also a new indeterminate 1 cm lesion in the medial left kidney. Recommend follow-up renal MRI when patient is stable and can not tolerate an abdominal MRI. 3. Negative for pulmonary embolism.  Volume loss at the lung bases. 4. Chronic small pulmonary nodules. Index pulmonary nodule in right middle lobe has not significantly changed since 2017. 5. **An incidental finding of potential clinical significance has been found. Mildly enlarged right axillary lymph nodes of uncertain etiology. These lymph nodes are small but have enlarged since 2017.** 6. **An incidental finding of potential clinical significance has been found. 2.5 cm low-density structure in the right ovary. This could represent an ovarian or adnexal cyst but atypical for a patient of this age. Recommend follow-up pelvic ultrasound.** These results were called by telephone at the time of interpretation on 12/13/2019 at 4:25 pm to provider [facility] , who verbally acknowledged these results. Electronically Signed   By: Markus Daft M.D.   On: 12/13/2019 16:35   DG Chest Port 1 View  Result Date: 12/14/2019 CLINICAL DATA:  74 year old with  acute pancreatitis and shortness  of breath. EXAM: PORTABLE CHEST 1 VIEW COMPARISON:  12/29/2018 and earlier. FINDINGS: Markedly suboptimal inspiration accounts for atelectasis in the lung bases, LEFT greater than RIGHT. Lungs otherwise clear. Pulmonary vascularity normal. Cardiac silhouette normal in size for AP portable technique and degree of inspiration. IMPRESSION: Markedly suboptimal inspiration accounts for bibasilar atelectasis, LEFT greater than RIGHT. Electronically Signed   By: Evangeline Dakin M.D.   On: 12/14/2019 09:38   DG ERCP BILIARY & PANCREATIC DUCTS  Result Date: 12/14/2019 CLINICAL DATA:  74 year old female with choledocholithiasis EXAM: ERCP TECHNIQUE: Multiple spot images obtained with the fluoroscopic device and submitted for interpretation post-procedure. FLUOROSCOPY TIME:  Fluoroscopy Time:  1 minutes 34 seconds Radiation Exposure Index (if provided by the fluoroscopic device): 24.6 mGy Number of Acquired Spot Images: 0 COMPARISON:  CT scan of the abdomen and pelvis 12/13/2019 FINDINGS: Two intraoperative saved images are submitted for review. The images demonstrate a flexible endoscope in the descending duodenum with wire cannulation of the common bile duct. The second image demonstrates placement of a plastic biliary stent. IMPRESSION: ERCP with placement of plastic biliary stent. These images were submitted for radiologic interpretation only. Please see the procedural report for the amount of contrast and the fluoroscopy time utilized. Electronically Signed   By: Jacqulynn Cadet M.D.   On: 12/14/2019 14:37    Medications:  Scheduled: . HYDROmorphone   Intravenous Q4H  . insulin aspart  0-9 Units Subcutaneous Q4H  . insulin glargine  7 Units Subcutaneous Daily   Continuous: . lactated ringers 150 mL/hr at 12/15/19 0300  . piperacillin-tazobactam (ZOSYN)  IV 3.375 g (12/15/19 0533)    Assessment/Plan: 1) Gallstone pancreatitis. 2) Uncontrolled pain. 3) ARF.   The  patient is struggling with her pancreatitis pain.  The ERCP was successful with stent placement.  This was performed as there was significant edema, which precluded proper visualization to perform a safe sphincterotomy.  No over stones were noted with the fluoroscopy.  Her liver enzymes have mildly improved.  Her creatinine has increased significantly, but she is +3 liters of fluids.  She states that she is not making much urine.  Her HCT dropped down from 50 down to 38 suggesting good hydration.    Plan: 1) Start PCA.  Her pulmonary status is being monitored.  She is saturating in the low 90's. 2) Continue with aggressive IV hydration. 3) Follow creatinine.  LOS: 2 days   Jennifermarie Franzen D 12/15/2019, 6:50 AM

## 2019-12-15 NOTE — Progress Notes (Signed)
PROGRESS NOTE    Katherine Oliver  PIR:[id number] DOB: May 13, 1946 DOA: 12/13/2019 PCP: [name], MD   Brief Narrative:  74 year old with history of HTN, DM2, CKD stage IIIa, OA, recurrent UTIs with recent diagnosis of failed on Macrobid admitted to the hospital at [facility] for abdominal pain and watery diarrhea.  Diagnosed with acute gallstone pancreatitis therefore transferred to [facility] for treatment due to lack of GI coverage.  Case was discussed by the previous provider with Dr. Benson Norway, GI.  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, without long-term current use of insulin (HCC)   HTN (hypertension)   Dyslipidemia associated with type 2 diabetes mellitus (HCC)   NAFLD (nonalcoholic fatty liver disease)   GERD (gastroesophageal reflux disease)   Polycythemia   Obesity, Class I, BMI 30-34.9   CKD stage 3 due to type 2 diabetes mellitus (HCC)   Acute gallstone pancreatitis   Sepsis ([place])   Acute renal failure (ARF) ([place])   Acute respiratory failure (HCC)  Severe sepsis with rapidly worsening multiorgan dysfunction  Insetting of acute pancreatitis presumed to be secondary to gallstones Elevated liver enzymes-hepatocellular pattern -CT abdomen pelvis 12/13/2019 showed acute pancreatitis likely secondary to an obstructive stone in the distal CBD near the ampulla with heterogenous areas of pancreatic necrosis. -GI on board.  Underwent ERCP with stent placement on 12/14/2019.  Lipase was downtrending but this started rising again today.  Started on PCA pump for pain control by GI. -Since, has developed worsening renal failure and respiratory failure -Concern for abdominal compartment syndrome given abdominal distention, pain with intra-abdominal pathology on aggressive fluid resuscitation and multiorgan failure.  Bladder pressure ordered.  Per general surgery, she currently does not have abdominal compartment  syndrome and recommended monitoring bladder pressure every 4-6 hours.  To call them back if bladder pressure uptrending. -She was on aggressive fluid resuscitation which has been decreased to 50 cc/h per nephrology recommendations due to anuria.  Continue Zosyn.  Lactic acidosis improving. -Critical care consulted  Acute hypoxic respiratory failure Increasing oxygen requirements, now on 6 L HFNC, tachypneic with accessory muscle use Hypoxia likely multifactorial in setting of atelectasis, volume overload and systemic inflammation  Acute kidney injury on CKD stage IIIa -Creatinine increased from 1.8 to 4.2.  Anuric overnight and has had minimal urine output today.  Hyperkalemic.  Foley catheter placed for close I/O monitoring and bladder pressure measurement -Avoid nephrotoxic drugs, renally dose all medications -Fractional excretion of urine consistent with prerenal etiology -Nephrology on board-rate of fluids decreased to 50 cc/h as she is anuric.  May need renal replacement therapy pending labs  Diabetes mellitus type 2 -Insulin sliding scale and Accu-Chek -Metformin on hold.  Continue Lantus 7 units  Hyperlipidemia -Statin on hold due to transaminitis  Essential hypertension -P.o. meds on hold  GERD -PPI  Right axillary lymphadenopathy 2.5 cm low-density mass in the right ovary -Needs outpatient follow-up for these incidental findings on CT abdomen pelvis  DVT prophylaxis: SCDs Code Status: Full Family Communication: Updated husband at bedside Disposition Plan:   Patient From= Home  Disposition destination pending clinical stability.  She is critically ill and needs inpatient stay for worsening multiorgan dysfunction.  Likely transfer to ICU pending critical care recommendations.  Consultants:   GI, nephrology, critical care, general surgery  Procedures:   ERCP 2/10  Antimicrobials:   Zosyn   Subjective: Anuric overnight.  This morning complains of abdominal  pain that has  not improved since prior to procedure.  Denies nausea.  Reports dyspnea is worse than yesterday.  Denies dysuria.   Examination:  General exam: Uncomfortable appearing, awake, alert and answering questions appropriately Respiratory system: Tachypneic, mild accessory muscle use, shallow breathing, decreased breath sounds bilaterally Cardiovascular system: Sinus tachycardia, regular Gastrointestinal system: Abdomen distended, diffusely tender, tends to guard, bowel sounds present Central nervous system: Alert and oriented. No focal neurological deficits. Extremities: Symmetric 5 x 5 power. Skin: No rashes, lesions or ulcers Psychiatry: Judgement and insight appear normal. Mood & affect appropriate.   Objective: Vitals:   12/15/19 0824 12/15/19 1230 12/15/19 1258 12/15/19 1300  BP:  122/62  (!) 142/125  Pulse:  (!) 124  (!) 127  Resp: (!) 22 14 16  (!) 21  Temp:  97.7 F (36.5 C)    TempSrc:  Oral    SpO2: 92% 91% 90%   Weight:      Height:        Intake/Output Summary (Last 24 hours) at 12/15/2019 1413 Last data filed at 12/15/2019 1404 Gross per 24 hour  Intake 2131.95 ml  Output 150 ml  Net 1981.95 ml   Filed Weights   12/13/19 2044 12/13/19 2100  Weight: 78.3 kg 78.3 kg     Data Reviewed:   CBC: Recent Labs  Lab 12/13/19 1400 12/14/19 0215 12/15/19 0309  WBC 26.4* 12.8* 17.9*  HGB 16.2* 15.6* 12.0  HCT 51.2* 50.6* 38.7  MCV 84.6 86.6 88.4  PLT 356 194 737   Basic Metabolic Panel: Recent Labs  Lab 12/13/19 1400 12/13/19 2230 12/14/19 0215 12/15/19 0309  NA 140 141 139 138  K 3.6 4.8 5.8* 5.7*  CL 101 103 102 103  CO2 22 18* 19* 21*  GLUCOSE 219* 265* 267* 183*  BUN 26* 25* 27* 47*  CREATININE 1.09* 1.57* 1.83* 4.29*  CALCIUM 10.3 9.8 9.7 8.6*  MG  --   --  1.7 1.5*  PHOS  --   --  5.2*  --    GFR: Estimated Creatinine Clearance: 10.7 mL/min (A) (by C-G formula based on SCr of 4.29 mg/dL (H)). Liver Function Tests: Recent Labs    Lab 12/13/19 1400 12/13/19 2230 12/14/19 0215 12/15/19 0309  AST 194* 542* 474* 421*  ALT 120* 390* 402* 268*  ALKPHOS 68 78 75 63  BILITOT 2.0* 2.0* 1.7* 1.6*  PROT 7.9 7.1 7.0 6.1*  ALBUMIN 4.9 4.6 4.5 3.4*   Recent Labs  Lab 12/13/19 1400 12/14/19 0215 12/15/19 0309  LIPASE 5,205* 1,048* 2,210*   No results for input(s): AMMONIA in the last 168 hours. Coagulation Profile: No results for input(s): INR, PROTIME in the last 168 hours. Cardiac Enzymes: Recent Labs  Lab 12/14/19 0215  CKTOTAL 38   BNP (last 3 results) Recent Labs    12/29/18 1531  PROBNP 26.0   HbA1C: Recent Labs    12/14/19 0215  HGBA1C 6.4*   CBG: Recent Labs  Lab 12/14/19 2001 12/15/19 0028 12/15/19 0426 12/15/19 0813 12/15/19 1229  GLUCAP 171* 154* 155* 166* 174*   Lipid Profile: Recent Labs    12/13/19 2230  CHOL 160  HDL 52  LDLCALC 74  TRIG 169*  CHOLHDL 3.1   Thyroid Function Tests: No results for input(s): TSH, T4TOTAL, FREET4, T3FREE, THYROIDAB in the last 72 hours. Anemia Panel: No results for input(s): VITAMINB12, FOLATE, FERRITIN, TIBC, IRON, RETICCTPCT in the last 72 hours. Sepsis Labs: Recent Labs  Lab 12/13/19 1855 12/13/19 2230 12/14/19 0215 12/15/19 1011  PROCALCITON  --  3.47  --   --   LATICACIDVEN 4.9* 6.2* 4.9* 2.2*    Recent Results (from the past 240 hour(s))  Microscopic Examination     Status: Abnormal   Collection Time: 12/12/19  1:32 PM   URINE  Result Value Ref Range Status   WBC, UA >30W 0 - 5 /hpf Final   RBC None seen 0 - 2 /hpf Final   Epithelial Cells (non renal) 0-10 0 - 10 /hpf Final   Renal Epithel, UA 0-10 (A) None seen /hpf Final   Bacteria, UA Few None seen/Few Final  CULTURE, URINE COMPREHENSIVE     Status: Abnormal   Collection Time: 12/12/19  1:52 PM   Specimen: Urine   UR  Result Value Ref Range Status   Urine Culture, Comprehensive Final report (A)  Final   Organism ID, Bacteria Enterococcus faecalis (A)  Final     Comment: Greater than 100,000 colony forming units per mL   ANTIMICROBIAL SUSCEPTIBILITY Comment  Final    Comment:       ** S = Susceptible; I = Intermediate; R = Resistant **                    P = Positive; N = Negative             MICS are expressed in micrograms per mL    Antibiotic                 RSLT#1    RSLT#2    RSLT#3    RSLT#4 Ciprofloxacin                  S Levofloxacin                   S Nitrofurantoin                 S Penicillin                     S Tetracycline                   R Vancomycin                     S   Blood culture (routine x 2)     Status: None (Preliminary result)   Collection Time: 12/13/19  3:04 PM   Specimen: BLOOD  Result Value Ref Range Status   Specimen Description BLOOD RIGHT WRIST  Final   Special Requests   Final    BOTTLES DRAWN AEROBIC AND ANAEROBIC Blood Culture results may not be optimal due to an inadequate volume of blood received in culture bottles   Culture   Final    NO GROWTH 2 DAYS Performed at [facility], [address] [place], [place] [address]    Report Status PENDING  Incomplete  Blood culture (routine x 2)     Status: None (Preliminary result)   Collection Time: 12/13/19  3:04 PM   Specimen: BLOOD  Result Value Ref Range Status   Specimen Description BLOOD LEFT HAND  Final   Special Requests   Final    BOTTLES DRAWN AEROBIC AND ANAEROBIC Blood Culture results may not be optimal due to an inadequate volume of blood received in culture bottles   Culture   Final    NO GROWTH 2 DAYS Performed at [facility], [address] [place], [place]  [address]    Report Status PENDING  Incomplete  Respiratory Panel by RT PCR (Flu A&B, Covid) - Nasopharyngeal Swab     Status: None   Collection Time: 12/13/19  3:04 PM   Specimen: Nasopharyngeal Swab  Result Value Ref Range Status   SARS Coronavirus 2 by RT PCR NEGATIVE NEGATIVE Final    Comment: (NOTE) SARS-CoV-2 target nucleic acids are NOT  DETECTED. The SARS-CoV-2 RNA is generally detectable in upper respiratoy specimens during the acute phase of infection. The lowest concentration of SARS-CoV-2 viral copies this assay can detect is 131 copies/mL. A negative result does not preclude SARS-Cov-2 infection and should not be used as the sole basis for treatment or other patient management decisions. A negative result may occur with  improper specimen collection/handling, submission of specimen other than nasopharyngeal swab, presence of viral mutation(s) within the areas targeted by this assay, and inadequate number of viral copies (<131 copies/mL). A negative result must be combined with clinical observations, patient history, and epidemiological information. The expected result is Negative. Fact Sheet for Patients:  PinkCheek.be Fact Sheet for Healthcare Providers:  GravelBags.it This test is not yet ap proved or cleared by the [place] FDA and  has been authorized for detection and/or diagnosis of SARS-CoV-2 by FDA under an Emergency Use Authorization (EUA). This EUA will remain  in effect (meaning this test can be used) for the duration of the COVID-19 declaration under Section 564(b)(1) of the Act, 21 U.S.C. section 360bbb-3(b)(1), unless the authorization is terminated or revoked sooner.    Influenza A by PCR NEGATIVE NEGATIVE Final   Influenza B by PCR NEGATIVE NEGATIVE Final    Comment: (NOTE) The Xpert Xpress SARS-CoV-2/FLU/RSV assay is intended as an aid in  the diagnosis of influenza from Nasopharyngeal swab specimens and  should not be used as a sole basis for treatment. Nasal washings and  aspirates are unacceptable for Xpert Xpress SARS-CoV-2/FLU/RSV  testing. Fact Sheet for Patients: PinkCheek.be Fact Sheet for Healthcare Providers: GravelBags.it This test is not yet approved or cleared  by the [place] FDA and  has been authorized for detection and/or diagnosis of SARS-CoV-2 by  FDA under an Emergency Use Authorization (EUA). This EUA will remain  in effect (meaning this test can be used) for the duration of the  Covid-19 declaration under Section 564(b)(1) of the Act, 21  U.S.C. section 360bbb-3(b)(1), unless the authorization is  terminated or revoked. Performed at [facility], [address] [place], [place] [address]   Culture, blood (x 2)     Status: None (Preliminary result)   Collection Time: 12/13/19 10:30 PM   Specimen: BLOOD  Result Value Ref Range Status   Specimen Description BLOOD LEFT ANTECUBITAL  Final   Special Requests   Final    BOTTLES DRAWN AEROBIC AND ANAEROBIC Blood Culture adequate volume   Culture   Final    NO GROWTH 2 DAYS Performed at [facility], [place] [address], [place], [place] [address]    Report Status PENDING  Incomplete  Culture, blood (x 2)     Status: None (Preliminary result)   Collection Time: 12/13/19 10:39 PM   Specimen: BLOOD RIGHT HAND  Result Value Ref Range Status   Specimen Description BLOOD RIGHT HAND  Final   Special Requests   Final    BOTTLES DRAWN AEROBIC AND ANAEROBIC Blood Culture adequate volume   Culture   Final    NO GROWTH 2 DAYS Performed at [facility], [address] [place], [place] [address]    Report Status PENDING  Incomplete         Radiology Studies: DG Abd 1 View  Result Date: 12/14/2019 CLINICAL DATA:  74 year old with acute pancreatitis and acute onset of abdominal distention. EXAM: ABDOMEN - 1 VIEW COMPARISON:  CT abdomen and pelvis yesterday. FINDINGS: Since the CT yesterday, development of borderline to mild gaseous distension of small bowel throughout the abdomen. Gas within normal caliber ascending colon. Remainder of the colon decompressed as on yesterday's CT. Residual contrast material in the renal parenchyma bilaterally and in the urinary bladder  related to yesterday's CT, query acute kidney injury. No suggestion of free air on the supine image. Surgical clips in the RIGHT UPPER QUADRANT from prior cholecystectomy. IMPRESSION: 1. Mild generalized ileus. No evidence of bowel obstruction currently. 2. Residual contrast material in the renal parenchyma bilaterally and in the urinary bladder related to yesterday's CT, query acute kidney injury/renal insufficiency. Electronically Signed   By: Evangeline Dakin M.D.   On: 12/14/2019 09:37   CT Angio Chest PE W and/or Wo Contrast  Result Date: 12/13/2019 CLINICAL DATA:  74 year old female with lower abdominal pain. Shortness of breath. EXAM: CT ANGIOGRAPHY CHEST CT ABDOMEN AND PELVIS WITH CONTRAST TECHNIQUE: Multidetector CT imaging of the chest was performed using the standard protocol during bolus administration of intravenous contrast. Multiplanar CT image reconstructions and MIPs were obtained to evaluate the vascular anatomy. Multidetector CT imaging of the abdomen and pelvis was performed using the standard protocol during bolus administration of intravenous contrast. CONTRAST:  61mL OMNIPAQUE IOHEXOL 350 MG/ML SOLN COMPARISON:  Chest CT 01/11/2016 and CT abdomen 12/02/2013 FINDINGS: CTA CHEST FINDINGS Cardiovascular: Negative for pulmonary embolism. Normal caliber of the thoracic aorta. Heart size is within normal limits. No significant pericardial fluid. Great vessels patent. Mediastinum/Nodes: Normal appearance of mediastinal structures. No significant node enlargement in the mediastinum or hilar regions. There are small right axillary lymph nodes. However, these right axillary lymph nodes have clearly enlarged from the exam in 2017. Index lymph node measures up to 8 mm on sequence 1, image 21 and this lymph node was barely perceptible on the prior examination. Mild lymphadenopathy in the right axilla is nonspecific. No significant left axillary lymph node enlargement. Lungs/Pleura: Trachea and mainstem  bronchi are patent. Dependent atelectasis in both lower lobes. Again noted are small scattered pulmonary nodules. Index lesion in the right middle lobe on sequence 4, image 46 measures 4 mm and stable. There are additional small scattered pulmonary nodules predominantly along the pleural surfaces. Majority of these small pulmonary nodules are stable. Limited evaluation of the lung bases due to the atelectasis. No large pleural effusions. Question a new 2 mm nodule in the periphery of the right lower lobe on sequence 4, image 51. Musculoskeletal: No acute bone abnormality. Review of the MIP images confirms the above findings. CT ABDOMEN and PELVIS FINDINGS Hepatobiliary: Diffuse low-attenuation of the liver is compatible with steatosis. Gallbladder has been removed. Subtle low-density along periphery of the posterior right hepatic lobe on sequence 4, image 20 is nonspecific. There is mild intrahepatic biliary dilatation. Mild dilatation of the common bile duct measuring up to 9 mm on sequence 4, image 38. Large amount of edema and stranding in the porta hepatis. There is a calcification near the distal common bile duct and this is compatible with an obstructing bile duct stone. Pancreas: Extensive edema and inflammatory changes in the upper abdomen centered around the pancreas. Pancreatic enhancement  is heterogeneous and compatible with edema and possibly areas of pancreatic necrosis. No discrete fluid or pseudocyst formations. No significant pancreatic duct dilatation. Spleen: Normal in size without focal abnormality. Adrenals/Urinary Tract: Normal adrenal glands. Stable appearance of the right kidney. Tiny hypodensities along the right kidney lower pole are suggestive for small cysts. Right extrarenal pelvis is unchanged without right hydronephrosis. Again noted is a exophytic cyst in the left kidney lower pole. There is a new area of decreased attenuation along the posterior left kidney lower pole region on  sequence 4, image 50. The central aspect of the low-density area is hyperdense and may be enhancing. Based on the sagittal reformats, this could represent a renal infarct or focal pyelonephritis. Difficult to exclude an atypical lesion at this location. Negative for left hydronephrosis. Normal appearance of the urinary bladder. Indeterminate low-density structure in the medial left kidney on sequence 9 image 27 measures up to 1.0 cm. Stomach/Bowel: Diverticulosis in the sigmoid colon without colonic inflammation. Normal appearance stomach. No evidence for small or large bowel struck shin. Calcification near the distal common bile duct is likely at the ampulla. 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 Main visceral arteries are patent. No abdominopelvic lymphadenopathy. Portal venous system is patent. Splenic vein is patent. Reproductive: Hysterectomy. New or enlarged low-density structure involving the right ovary on sequence 4, image 71. No evidence for a left adnexal lesion. Other: Large amount of edema in the central abdominal mesentery and around the duodenum and porta hepatis. No ascites in pelvis. Negative free air. Musculoskeletal: Focal sclerosis in the left ilium on sequence 4, image 69 is stable. No acute bone abnormality. Review of the MIP images confirms the above findings. IMPRESSION: 1. Acute pancreatitis likely secondary to an obstructive stone in the distal common bile duct near the ampulla. 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 No evidence for pseudocyst formations at time. Mild intrahepatic and extrahepatic biliary dilatation. Subtle low-density along the posterior right hepatic lobe may be related to inflammation but this area is indeterminate. 2. Indeterminate left renal lesions. Unusual lesion or hypodensities in the posterior left kidney lower pole could represent areas of infarct, focal pyelonephritis or atypical  neoplastic lesion. There is also a new indeterminate 1 cm lesion in the medial left kidney. Recommend follow-up renal MRI when patient is stable and can not tolerate an abdominal MRI. 3. Negative for pulmonary embolism.  Volume loss at the lung bases. 4. Chronic small pulmonary nodules. Index pulmonary nodule in right middle lobe has not significantly changed since 2017. 5. **An incidental finding of potential clinical significance has been found. Mildly enlarged right axillary lymph nodes of uncertain etiology. These lymph nodes are small but have enlarged since 2017.** 6. **An incidental finding of potential clinical significance has been found. 2.5 cm low-density structure in the right ovary. This could represent an ovarian or adnexal cyst but atypical for a patient of this age. Recommend follow-up pelvic ultrasound.** These results were called by telephone at the time of interpretation on 12/13/2019 at 4:25 pm to provider [facility] , who verbally acknowledged these results. Electronically Signed   By: Markus Daft M.D.   On: 12/13/2019 16:35   CT ABDOMEN PELVIS W CONTRAST  Result Date: 12/13/2019 CLINICAL DATA:  74 year old female with lower abdominal pain. Shortness of breath. EXAM: CT ANGIOGRAPHY CHEST CT ABDOMEN AND PELVIS WITH CONTRAST TECHNIQUE: Multidetector CT imaging of the chest was performed using the standard protocol during bolus administration of intravenous contrast.  Multiplanar CT image reconstructions and MIPs were obtained to evaluate the vascular anatomy. Multidetector CT imaging of the abdomen and pelvis was performed using the standard protocol during bolus administration of intravenous contrast. CONTRAST:  41mL OMNIPAQUE IOHEXOL 350 MG/ML SOLN COMPARISON:  Chest CT 01/11/2016 and CT abdomen 12/02/2013 FINDINGS: CTA CHEST FINDINGS Cardiovascular: Negative for pulmonary embolism. Normal caliber of the thoracic aorta. Heart size is within normal limits. No significant pericardial fluid. Great  vessels patent. Mediastinum/Nodes: Normal appearance of mediastinal structures. No significant node enlargement in the mediastinum or hilar regions. There are small right axillary lymph nodes. However, these right axillary lymph nodes have clearly enlarged from the exam in 2017. Index lymph node measures up to 8 mm on sequence 1, image 21 and this lymph node was barely perceptible on the prior examination. Mild lymphadenopathy in the right axilla is nonspecific. No significant left axillary lymph node enlargement. Lungs/Pleura: Trachea and mainstem bronchi are patent. Dependent atelectasis in both lower lobes. Again noted are small scattered pulmonary nodules. Index lesion in the right middle lobe on sequence 4, image 46 measures 4 mm and stable. There are additional small scattered pulmonary nodules predominantly along the pleural surfaces. Majority of these small pulmonary nodules are stable. Limited evaluation of the lung bases due to the atelectasis. No large pleural effusions. Question a new 2 mm nodule in the periphery of the right lower lobe on sequence 4, image 51. Musculoskeletal: No acute bone abnormality. Review of the MIP images confirms the above findings. CT ABDOMEN and PELVIS FINDINGS Hepatobiliary: Diffuse low-attenuation of the liver is compatible with steatosis. Gallbladder has been removed. Subtle low-density along periphery of the posterior right hepatic lobe on sequence 4, image 20 is nonspecific. There is mild intrahepatic biliary dilatation. Mild dilatation of the common bile duct measuring up to 9 mm on sequence 4, image 38. Large amount of edema and stranding in the porta hepatis. There is a calcification near the distal common bile duct and this is compatible with an obstructing bile duct stone. Pancreas: Extensive edema and inflammatory changes in the upper abdomen centered around the pancreas. Pancreatic enhancement is heterogeneous and compatible with edema and possibly areas of  pancreatic necrosis. No discrete fluid or pseudocyst formations. No significant pancreatic duct dilatation. Spleen: Normal in size without focal abnormality. Adrenals/Urinary Tract: Normal adrenal glands. Stable appearance of the right kidney. Tiny hypodensities along the right kidney lower pole are suggestive for small cysts. Right extrarenal pelvis is unchanged without right hydronephrosis. Again noted is a exophytic cyst in the left kidney lower pole. There is a new area of decreased attenuation along the posterior left kidney lower pole region on sequence 4, image 50. The central aspect of the low-density area is hyperdense and may be enhancing. Based on the sagittal reformats, this could represent a renal infarct or focal pyelonephritis. Difficult to exclude an atypical lesion at this location. Negative for left hydronephrosis. Normal appearance of the urinary bladder. Indeterminate low-density structure in the medial left kidney on sequence 9 image 27 measures up to 1.0 cm. Stomach/Bowel: Diverticulosis in the sigmoid colon without colonic inflammation. Normal appearance stomach. No evidence for small or large bowel struck shin. Calcification near the distal common bile duct is likely at the ampulla. 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 Main visceral arteries are patent. No abdominopelvic lymphadenopathy. Portal venous system is patent. Splenic vein is patent. Reproductive: Hysterectomy. New or enlarged low-density structure involving the right ovary on sequence 4, image 71. No evidence  for a left adnexal lesion. Other: Large amount of edema in the central abdominal mesentery and around the duodenum and porta hepatis. No ascites in pelvis. Negative free air. Musculoskeletal: Focal sclerosis in the left ilium on sequence 4, image 69 is stable. No acute bone abnormality. Review of the MIP images confirms the above findings. IMPRESSION: 1. Acute pancreatitis likely  secondary to an obstructive stone in the distal common bile duct near the ampulla. 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 No evidence for pseudocyst formations at time. Mild intrahepatic and extrahepatic biliary dilatation. Subtle low-density along the posterior right hepatic lobe may be related to inflammation but this area is indeterminate. 2. Indeterminate left renal lesions. Unusual lesion or hypodensities in the posterior left kidney lower pole could represent areas of infarct, focal pyelonephritis or atypical neoplastic lesion. There is also a new indeterminate 1 cm lesion in the medial left kidney. Recommend follow-up renal MRI when patient is stable and can not tolerate an abdominal MRI. 3. Negative for pulmonary embolism.  Volume loss at the lung bases. 4. Chronic small pulmonary nodules. Index pulmonary nodule in right middle lobe has not significantly changed since 2017. 5. **An incidental finding of potential clinical significance has been found. Mildly enlarged right axillary lymph nodes of uncertain etiology. These lymph nodes are small but have enlarged since 2017.** 6. **An incidental finding of potential clinical significance has been found. 2.5 cm low-density structure in the right ovary. This could represent an ovarian or adnexal cyst but atypical for a patient of this age. Recommend follow-up pelvic ultrasound.** These results were called by telephone at the time of interpretation on 12/13/2019 at 4:25 pm to provider [facility] , who verbally acknowledged these results. Electronically Signed   By: Markus Daft M.D.   On: 12/13/2019 16:35   DG Chest Port 1 View  Result Date: 12/14/2019 CLINICAL DATA:  74 year old with acute pancreatitis and shortness of breath. EXAM: PORTABLE CHEST 1 VIEW COMPARISON:  12/29/2018 and earlier. FINDINGS: Markedly suboptimal inspiration accounts for atelectasis in the lung bases, LEFT greater than RIGHT. Lungs otherwise clear.  Pulmonary vascularity normal. Cardiac silhouette normal in size for AP portable technique and degree of inspiration. IMPRESSION: Markedly suboptimal inspiration accounts for bibasilar atelectasis, LEFT greater than RIGHT. Electronically Signed   By: Evangeline Dakin M.D.   On: 12/14/2019 09:38   DG ERCP BILIARY & PANCREATIC DUCTS  Result Date: 12/14/2019 CLINICAL DATA:  74 year old female with choledocholithiasis EXAM: ERCP TECHNIQUE: Multiple spot images obtained with the fluoroscopic device and submitted for interpretation post-procedure. FLUOROSCOPY TIME:  Fluoroscopy Time:  1 minutes 34 seconds Radiation Exposure Index (if provided by the fluoroscopic device): 24.6 mGy Number of Acquired Spot Images: 0 COMPARISON:  CT scan of the abdomen and pelvis 12/13/2019 FINDINGS: Two intraoperative saved images are submitted for review. The images demonstrate a flexible endoscope in the descending duodenum with wire cannulation of the common bile duct. The second image demonstrates placement of a plastic biliary stent. IMPRESSION: ERCP with placement of plastic biliary stent. These images were submitted for radiologic interpretation only. Please see the procedural report for the amount of contrast and the fluoroscopy time utilized. Electronically Signed   By: Jacqulynn Cadet M.D.   On: 12/14/2019 14:37     Scheduled Meds:  Chlorhexidine Gluconate Cloth  6 each Topical Daily   HYDROmorphone   Intravenous Q4H   insulin aspart  0-9 Units Subcutaneous Q4H   insulin glargine  7 Units Subcutaneous Daily  sodium zirconium cyclosilicate  10 g Oral O1V   Continuous Infusions:  lactated ringers 50 mL/hr at 12/15/19 1253   piperacillin-tazobactam (ZOSYN)  IV 3.375 g (12/15/19 0533)     LOS: 2 days   Time spent= 40 mins   Lucky Cowboy, MD [facility]  If 7PM-7AM, please contact night-coverage  12/15/2019, 2:13 PM

## 2019-12-15 NOTE — Progress Notes (Signed)
Patient ID: Katherine Oliver, female   DOB: [birth year], 74 y.o.   MRN: [id number]    1 Day Post-Op  Subjective: Patient who was admitted with necrotizing pancreatitis thought to be secondary to a retained CBD stone; however, the patient underwent ERCP yesterday and no stone was identified.  A plastic stent was placed.  The patient has been noted to have an ileus secondary to her pancreatitis.  She has also developed some renal failure as well as respiratory failure requiring increasing O2 needs on [place]. She has significant abdominal pain.  There was some concern by the medical service about abdominal compartment syndrome and the bladder pressures that were being obtained from the monitor.  We were asked to see the patient to check the bladder pressures and see if she had abdominal compartment syndrome.  ROS: See above, otherwise other systems negative  Objective: Vital signs in last 24 hours: Temp:  [97.6 F (36.4 C)-98.1 F (36.7 C)] 97.7 F (36.5 C) (02/11 1230) Pulse Rate:  [109-125] 124 (02/11 1230) Resp:  [11-25] 16 (02/11 1258) BP: (122-183)/(41-88) 122/62 (02/11 1230) SpO2:  [90 %-97 %] 90 % (02/11 1258) Last BM Date: 12/13/19  Intake/Output from previous day: 02/10 0701 - 02/11 0700 In: 3357 [I.V.:2764.5; IV Piggyback:592.4] Out: 75 [Urine:75] Intake/Output this shift: No intake/output data recorded.  PE: General: ill-appearing, obese white female who is laying in bed in NAD HEENT: head is normocephalic, atraumatic.  Sclera are noninjected.  PERRL.  Ears and nose without any masses or lesions.  Mouth is pink and dry Heart: regular rhythm, but tachycardic.  Normal s1,s2. No obvious murmurs, gallops, or rubs noted.  Palpable radial and pedal pulses bilaterally Lungs: CTAB, no wheezes, rhonchi, or rales noted.  Respiratory effort nonlabored Abd: diffusely tender with voluntary guarding, but still soft despite some distention, hypoactive BS, no masses, hernias, or  organomegaly MS: all 4 extremities are symmetrical with no cyanosis, clubbing, or edema. Skin: warm and dry with no masses, lesions, or rashes Neuro: Cranial nerves 2-12 grossly intact, speech is normal Psych: A&Ox3 with an appropriate affect.   Lab Results:  Recent Labs    12/14/19 0215 12/15/19 0309  WBC 12.8* 17.9*  HGB 15.6* 12.0  HCT 50.6* 38.7  PLT 194 151   BMET Recent Labs    12/14/19 0215 12/15/19 0309  NA 139 138  K 5.8* 5.7*  CL 102 103  CO2 19* 21*  GLUCOSE 267* 183*  BUN 27* 47*  CREATININE 1.83* 4.29*  CALCIUM 9.7 8.6*   PT/INR No results for input(s): LABPROT, INR in the last 72 hours. CMP     Component Value Date/Time   NA 138 12/15/2019 0309   K 5.7 (H) 12/15/2019 0309   CL 103 12/15/2019 0309   CO2 21 (L) 12/15/2019 0309   GLUCOSE 183 (H) 12/15/2019 0309   BUN 47 (H) 12/15/2019 0309   CREATININE 4.29 (H) 12/15/2019 0309   CREATININE 1.03 10/20/2013 1751   CALCIUM 8.6 (L) 12/15/2019 0309   PROT 6.1 (L) 12/15/2019 0309   ALBUMIN 3.4 (L) 12/15/2019 0309   AST 421 (H) 12/15/2019 0309   ALT 268 (H) 12/15/2019 0309   ALKPHOS 63 12/15/2019 0309   BILITOT 1.6 (H) 12/15/2019 0309   GFRNONAA 9 (L) 12/15/2019 0309   GFRAA 11 (L) 12/15/2019 0309   Lipase     Component Value Date/Time   LIPASE 2,210 (H) 12/15/2019 0309       Studies/Results: DG Abd 1 View  Result Date:  12/14/2019 CLINICAL DATA:  74 year old with acute pancreatitis and acute onset of abdominal distention. EXAM: ABDOMEN - 1 VIEW COMPARISON:  CT abdomen and pelvis yesterday. FINDINGS: Since the CT yesterday, development of borderline to mild gaseous distension of small bowel throughout the abdomen. Gas within normal caliber ascending colon. Remainder of the colon decompressed as on yesterday's CT. Residual contrast material in the renal parenchyma bilaterally and in the urinary bladder related to yesterday's CT, query acute kidney injury. No suggestion of free air on the supine  image. Surgical clips in the RIGHT UPPER QUADRANT from prior cholecystectomy. IMPRESSION: 1. Mild generalized ileus. No evidence of bowel obstruction currently. 2. Residual contrast material in the renal parenchyma bilaterally and in the urinary bladder related to yesterday's CT, query acute kidney injury/renal insufficiency. Electronically Signed   By: Evangeline Dakin M.D.   On: 12/14/2019 09:37   CT Angio Chest PE W and/or Wo Contrast  Result Date: 12/13/2019 CLINICAL DATA:  74 year old female with lower abdominal pain. Shortness of breath. EXAM: CT ANGIOGRAPHY CHEST CT ABDOMEN AND PELVIS WITH CONTRAST TECHNIQUE: Multidetector CT imaging of the chest was performed using the standard protocol during bolus administration of intravenous contrast. Multiplanar CT image reconstructions and MIPs were obtained to evaluate the vascular anatomy. Multidetector CT imaging of the abdomen and pelvis was performed using the standard protocol during bolus administration of intravenous contrast. CONTRAST:  74mL OMNIPAQUE IOHEXOL 350 MG/ML SOLN COMPARISON:  Chest CT 01/11/2016 and CT abdomen 12/02/2013 FINDINGS: CTA CHEST FINDINGS Cardiovascular: Negative for pulmonary embolism. Normal caliber of the thoracic aorta. Heart size is within normal limits. No significant pericardial fluid. Great vessels patent. Mediastinum/Nodes: Normal appearance of mediastinal structures. No significant node enlargement in the mediastinum or hilar regions. There are small right axillary lymph nodes. However, these right axillary lymph nodes have clearly enlarged from the exam in 2017. Index lymph node measures up to 8 mm on sequence 1, image 21 and this lymph node was barely perceptible on the prior examination. Mild lymphadenopathy in the right axilla is nonspecific. No significant left axillary lymph node enlargement. Lungs/Pleura: Trachea and mainstem bronchi are patent. Dependent atelectasis in both lower lobes. Again noted are small scattered  pulmonary nodules. Index lesion in the right middle lobe on sequence 4, image 46 measures 4 mm and stable. There are additional small scattered pulmonary nodules predominantly along the pleural surfaces. Majority of these small pulmonary nodules are stable. Limited evaluation of the lung bases due to the atelectasis. No large pleural effusions. Question a new 2 mm nodule in the periphery of the right lower lobe on sequence 4, image 51. Musculoskeletal: No acute bone abnormality. Review of the MIP images confirms the above findings. CT ABDOMEN and PELVIS FINDINGS Hepatobiliary: Diffuse low-attenuation of the liver is compatible with steatosis. Gallbladder has been removed. Subtle low-density along periphery of the posterior right hepatic lobe on sequence 4, image 20 is nonspecific. There is mild intrahepatic biliary dilatation. Mild dilatation of the common bile duct measuring up to 9 mm on sequence 4, image 38. Large amount of edema and stranding in the porta hepatis. There is a calcification near the distal common bile duct and this is compatible with an obstructing bile duct stone. Pancreas: Extensive edema and inflammatory changes in the upper abdomen centered around the pancreas. Pancreatic enhancement is heterogeneous and compatible with edema and possibly areas of pancreatic necrosis. No discrete fluid or pseudocyst formations. No significant pancreatic duct dilatation. Spleen: Normal in size without focal abnormality. Adrenals/Urinary Tract: Normal  adrenal glands. Stable appearance of the right kidney. Tiny hypodensities along the right kidney lower pole are suggestive for small cysts. Right extrarenal pelvis is unchanged without right hydronephrosis. Again noted is a exophytic cyst in the left kidney lower pole. There is a new area of decreased attenuation along the posterior left kidney lower pole region on sequence 4, image 50. The central aspect of the low-density area is hyperdense and may be enhancing.  Based on the sagittal reformats, this could represent a renal infarct or focal pyelonephritis. Difficult to exclude an atypical lesion at this location. Negative for left hydronephrosis. Normal appearance of the urinary bladder. Indeterminate low-density structure in the medial left kidney on sequence 9 image 27 measures up to 1.0 cm. Stomach/Bowel: Diverticulosis in the sigmoid colon without colonic inflammation. Normal appearance stomach. No evidence for small or large bowel struck shin. Calcification near the distal common bile duct is likely at the ampulla. 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 Main visceral arteries are patent. No abdominopelvic lymphadenopathy. Portal venous system is patent. Splenic vein is patent. Reproductive: Hysterectomy. New or enlarged low-density structure involving the right ovary on sequence 4, image 71. No evidence for a left adnexal lesion. Other: Large amount of edema in the central abdominal mesentery and around the duodenum and porta hepatis. No ascites in pelvis. Negative free air. Musculoskeletal: Focal sclerosis in the left ilium on sequence 4, image 69 is stable. No acute bone abnormality. Review of the MIP images confirms the above findings. IMPRESSION: 1. Acute pancreatitis likely secondary to an obstructive stone in the distal common bile duct near the ampulla. 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 No evidence for pseudocyst formations at time. Mild intrahepatic and extrahepatic biliary dilatation. Subtle low-density along the posterior right hepatic lobe may be related to inflammation but this area is indeterminate. 2. Indeterminate left renal lesions. Unusual lesion or hypodensities in the posterior left kidney lower pole could represent areas of infarct, focal pyelonephritis or atypical neoplastic lesion. There is also a new indeterminate 1 cm lesion in the medial left kidney. Recommend  follow-up renal MRI when patient is stable and can not tolerate an abdominal MRI. 3. Negative for pulmonary embolism.  Volume loss at the lung bases. 4. Chronic small pulmonary nodules. Index pulmonary nodule in right middle lobe has not significantly changed since 2017. 5. **An incidental finding of potential clinical significance has been found. Mildly enlarged right axillary lymph nodes of uncertain etiology. These lymph nodes are small but have enlarged since 2017.** 6. **An incidental finding of potential clinical significance has been found. 2.5 cm low-density structure in the right ovary. This could represent an ovarian or adnexal cyst but atypical for a patient of this age. Recommend follow-up pelvic ultrasound.** These results were called by telephone at the time of interpretation on 12/13/2019 at 4:25 pm to provider [facility] , who verbally acknowledged these results. Electronically Signed   By: Markus Daft M.D.   On: 12/13/2019 16:35   CT ABDOMEN PELVIS W CONTRAST  Result Date: 12/13/2019 CLINICAL DATA:  74 year old female with lower abdominal pain. Shortness of breath. EXAM: CT ANGIOGRAPHY CHEST CT ABDOMEN AND PELVIS WITH CONTRAST TECHNIQUE: Multidetector CT imaging of the chest was performed using the standard protocol during bolus administration of intravenous contrast. Multiplanar CT image reconstructions and MIPs were obtained to evaluate the vascular anatomy. Multidetector CT imaging of the abdomen and pelvis was performed using the standard protocol during bolus administration of intravenous contrast.  CONTRAST:  73mL OMNIPAQUE IOHEXOL 350 MG/ML SOLN COMPARISON:  Chest CT 01/11/2016 and CT abdomen 12/02/2013 FINDINGS: CTA CHEST FINDINGS Cardiovascular: Negative for pulmonary embolism. Normal caliber of the thoracic aorta. Heart size is within normal limits. No significant pericardial fluid. Great vessels patent. Mediastinum/Nodes: Normal appearance of mediastinal structures. No significant node  enlargement in the mediastinum or hilar regions. There are small right axillary lymph nodes. However, these right axillary lymph nodes have clearly enlarged from the exam in 2017. Index lymph node measures up to 8 mm on sequence 1, image 21 and this lymph node was barely perceptible on the prior examination. Mild lymphadenopathy in the right axilla is nonspecific. No significant left axillary lymph node enlargement. Lungs/Pleura: Trachea and mainstem bronchi are patent. Dependent atelectasis in both lower lobes. Again noted are small scattered pulmonary nodules. Index lesion in the right middle lobe on sequence 4, image 46 measures 4 mm and stable. There are additional small scattered pulmonary nodules predominantly along the pleural surfaces. Majority of these small pulmonary nodules are stable. Limited evaluation of the lung bases due to the atelectasis. No large pleural effusions. Question a new 2 mm nodule in the periphery of the right lower lobe on sequence 4, image 51. Musculoskeletal: No acute bone abnormality. Review of the MIP images confirms the above findings. CT ABDOMEN and PELVIS FINDINGS Hepatobiliary: Diffuse low-attenuation of the liver is compatible with steatosis. Gallbladder has been removed. Subtle low-density along periphery of the posterior right hepatic lobe on sequence 4, image 20 is nonspecific. There is mild intrahepatic biliary dilatation. Mild dilatation of the common bile duct measuring up to 9 mm on sequence 4, image 38. Large amount of edema and stranding in the porta hepatis. There is a calcification near the distal common bile duct and this is compatible with an obstructing bile duct stone. Pancreas: Extensive edema and inflammatory changes in the upper abdomen centered around the pancreas. Pancreatic enhancement is heterogeneous and compatible with edema and possibly areas of pancreatic necrosis. No discrete fluid or pseudocyst formations. No significant pancreatic duct dilatation.  Spleen: Normal in size without focal abnormality. Adrenals/Urinary Tract: Normal adrenal glands. Stable appearance of the right kidney. Tiny hypodensities along the right kidney lower pole are suggestive for small cysts. Right extrarenal pelvis is unchanged without right hydronephrosis. Again noted is a exophytic cyst in the left kidney lower pole. There is a new area of decreased attenuation along the posterior left kidney lower pole region on sequence 4, image 50. The central aspect of the low-density area is hyperdense and may be enhancing. Based on the sagittal reformats, this could represent a renal infarct or focal pyelonephritis. Difficult to exclude an atypical lesion at this location. Negative for left hydronephrosis. Normal appearance of the urinary bladder. Indeterminate low-density structure in the medial left kidney on sequence 9 image 27 measures up to 1.0 cm. Stomach/Bowel: Diverticulosis in the sigmoid colon without colonic inflammation. Normal appearance stomach. No evidence for small or large bowel struck shin. Calcification near the distal common bile duct is likely at the ampulla. 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 Main visceral arteries are patent. No abdominopelvic lymphadenopathy. Portal venous system is patent. Splenic vein is patent. Reproductive: Hysterectomy. New or enlarged low-density structure involving the right ovary on sequence 4, image 71. No evidence for a left adnexal lesion. Other: Large amount of edema in the central abdominal mesentery and around the duodenum and porta hepatis. No ascites in pelvis. Negative free air. Musculoskeletal: Focal sclerosis in  the left ilium on sequence 4, image 69 is stable. No acute bone abnormality. Review of the MIP images confirms the above findings. IMPRESSION: 1. Acute pancreatitis likely secondary to an obstructive stone in the distal common bile duct near the ampulla. Pancreas is heterogeneous and  concerning for areas of pancreatic necrosis. No evidence for pseudocyst formations at time. Mild intrahepatic and extrahepatic biliary dilatation. Subtle low-density along the posterior right hepatic lobe may be related to inflammation but this area is indeterminate. 2. Indeterminate left renal lesions. Unusual lesion or hypodensities in the posterior left kidney lower pole could represent areas of infarct, focal pyelonephritis or atypical neoplastic lesion. There is also a new indeterminate 1 cm lesion in the medial left kidney. Recommend follow-up renal MRI when patient is stable and can not tolerate an abdominal MRI. 3. Negative for pulmonary embolism.  Volume loss at the lung bases. 4. Chronic small pulmonary nodules. Index pulmonary nodule in right middle lobe has not significantly changed since 2017. 5. **An incidental finding of potential clinical significance has been found. Mildly enlarged right axillary lymph nodes of uncertain etiology. These lymph nodes are small but have enlarged since 2017.** 6. **An incidental finding of potential clinical significance has been found. 2.5 cm low-density structure in the right ovary. This could represent an ovarian or adnexal cyst but atypical for a patient of this age. Recommend follow-up pelvic ultrasound.** These results were called by telephone at the time of interpretation on 12/13/2019 at 4:25 pm to provider [facility] , who verbally acknowledged these results. Electronically Signed   By: Markus Daft M.D.   On: 12/13/2019 16:35   DG Chest Port 1 View  Result Date: 12/14/2019 CLINICAL DATA:  74 year old with acute pancreatitis and shortness of breath. EXAM: PORTABLE CHEST 1 VIEW COMPARISON:  12/29/2018 and earlier. FINDINGS: Markedly suboptimal inspiration accounts for atelectasis in the lung bases, LEFT greater than RIGHT. Lungs otherwise clear. Pulmonary vascularity normal. Cardiac silhouette normal in size for AP portable technique and degree of inspiration.  IMPRESSION: Markedly suboptimal inspiration accounts for bibasilar atelectasis, LEFT greater than RIGHT. Electronically Signed   By: Evangeline Dakin M.D.   On: 12/14/2019 09:38   DG ERCP BILIARY & PANCREATIC DUCTS  Result Date: 12/14/2019 CLINICAL DATA:  74 year old female with choledocholithiasis EXAM: ERCP TECHNIQUE: Multiple spot images obtained with the fluoroscopic device and submitted for interpretation post-procedure. FLUOROSCOPY TIME:  Fluoroscopy Time:  1 minutes 34 seconds Radiation Exposure Index (if provided by the fluoroscopic device): 24.6 mGy Number of Acquired Spot Images: 0 COMPARISON:  CT scan of the abdomen and pelvis 12/13/2019 FINDINGS: Two intraoperative saved images are submitted for review. The images demonstrate a flexible endoscope in the descending duodenum with wire cannulation of the common bile duct. The second image demonstrates placement of a plastic biliary stent. IMPRESSION: ERCP with placement of plastic biliary stent. These images were submitted for radiologic interpretation only. Please see the procedural report for the amount of contrast and the fluoroscopy time utilized. Electronically Signed   By: Jacqulynn Cadet M.D.   On: 12/14/2019 14:37    Anti-infectives: Anti-infectives (From admission, onward)   Start     Dose/Rate Route Frequency Ordered Stop   12/13/19 2200  piperacillin-tazobactam (ZOSYN) IVPB 3.375 g     3.375 g 12.5 mL/hr over 240 Minutes Intravenous Every 8 hours 12/13/19 2124         Assessment/Plan ARF Acute respiratory failure  Pancreatitis The patient clearly has severe pancreatitis with some suggestion of necrosis on  her CT scan.  She also likely has an ileus contributing to her abdominal distention noted on plain films yesterday.  Her lactic acid has improved from 6 to 2 today.  She is having some worsening respiratory failure as well as renal failure; however, her abdomen is still distended, but soft.  She is not so tympanitic and  tight that I would clinically be concerned about abdominal compartment syndrome.  Her bladder pressures support this as well right now.  However, this is a clinical diagnosis more than an objective diagnosis.  No needs for surgical intervention at this time as she does not have abdominal compartment syndrome.  This was relayed to the primary service.  Will defer further management to primary service and other consultants.      LOS: 2 days    Henreitta Cea , [facility] 12/15/2019, 1:16 PM Please see Amion for pager number during day hours 7:00am-4:30pm or 7:00am -11:30am on weekends

## 2019-12-15 NOTE — Progress Notes (Signed)
CRITICAL VALUE ALERT  Critical Value:  Lactic acid 2.2  Date & Time Notied:  12/15/19@ 1055  Provider Notified: Dr. Posey Pronto  Orders Received/Actions taken: Latic acid was 4.9 prior lab result is to be expected.

## 2019-12-15 NOTE — Consult Note (Signed)
NAME:  Katherine Oliver, MRN:  [id number], DOB:  November 23, 1945, LOS: 2 ADMISSION DATE:  12/13/2019, CONSULTATION DATE:  2/11 REFERRING MD:  Posey Pronto, CHIEF COMPLAINT:   Worsening SIRS/respiratory failure  Brief History   74 year old female admitted on 2/9 with gallstone pancreatitis and urinary tract infection.  Underwent ERCP on 2/10 with successful stent placement.  Critical care consulted on 2/11 as patient was developing worsening pancreatic pain, increased tachycardia, increased work of breathing with supplemental oxygen need and worsening renal function.  History of present illness    74 year old female with history as mentioned below admitted on 2/9 with chief complaint of nausea vomiting diarrhea and abdominal pain.  Had been started on Macrobid day prior for urinary tract infection.  In ER diagnostic evaluation on 2/9 showed lactic acid up to 5, elevated LFTs with lipase 5200.  A CT of chest was obtained to rule out pulmonary embolus but instead showed possible pancreatic necrosis, no evidence of pseudocyst.  She was transferred to [facility] as there was no coverage at [facility]. A follow-up CT abdomen was obtained this showed calcified stone in the ampulla with mild duct dilation.  She was diagnosed with gallstone pancreatitis and gastroenterology was consulted with plan for ERCP and stone extraction on 2/10. Hospital course: Admitted to the hospitalist service on 2/9, IV fluids initiated, Zosyn started for leukocytosis, and outpatient Enterococcus urinary tract infection.  Tachycardic on telemetry. 2/10: Underwent ERCP by Dr. Benson Norway: Noted be technically difficult and complex, successful with biliary sphincterotomy performed and plastic stent placed into the common bile duct she returned to the stepdown unit 2/11: Having worse pain but apparently initially improved in the early morning, no function remarkably worse increased from 1.57 on admission to 4.29.  She was started on PCA for  pancreatitis related pain, nephrology consulted for worsening acute on chronic renal failure, surgery consulted for increased intrabladder pressure and concern for possible abdominal compartment syndrome, and critical care consulted for concern about overall clinical decline    Past Medical History  CKD stage III, allergic rhinitis, type 2 diabetes, GERD, hepatic steatosis, polycythemia  Significant Hospital Events   Admitted to the hospitalist service on 2/9, IV fluids initiated, Zosyn started for leukocytosis, and outpatient Enterococcus urinary tract infection.  Tachycardic on telemetry. 2/10: Underwent ERCP by Dr. Benson Norway: Noted be technically difficult and complex, successful with biliary sphincterotomy performed and plastic stent placed into the common bile duct she returned to the stepdown unit 2/11: Having worse pain but apparently initially improved in the early morning, no function remarkably worse increased from 1.57 on admission to 4.29.  She was started on PCA for pancreatitis related pain, nephrology consulted for worsening acute on chronic renal failure, surgery consulted for increased intrabladder pressure and concern for possible abdominal compartment syndrome, and critical care consulted for concern about overall clinical decline  Consults:  GI ->Hung consulted 2/9 General surg consulted 2/11 for concern about abd compartment  Renal consulted for Acute on chronic renal failure, hyperkalemia, lactic acidosis  Procedures:  ERCP 2/10: completed successfully w/ stent placement   Significant Diagnostic Tests:  2/9 CT angio: 1. Acute pancreatitis likely secondary to an obstructive stone in the distal common bile duct near the ampulla. 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 No evidence for pseudocyst formations at time. Mild intrahepatic and extrahepatic biliary dilatation. Subtle low-density along the posterior right hepatic lobe may be related to  inflammation but this area is indeterminate. 2. Indeterminate left renal lesions. Unusual  lesion or hypodensities in the posterior left kidney lower pole could represent areas of infarct, focal pyelonephritis or atypical neoplastic lesion. There is also a new indeterminate 1 cm lesion in the medial left kidney. Recommend follow-up renal MRI when patient is stable and can not tolerate an abdominal MRI. 2/11 CT abd/pelvis 1. Acute pancreatitis likely secondary to an obstructive stone in the distal common bile duct near the ampulla. 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 No evidence for pseudocyst formations at time. Mild intrahepatic and extrahepatic biliary dilatation. Subtle low-density along the posterior right hepatic lobe may be related to inflammation but this area is indeterminate. 2. Indeterminate left renal lesions. Unusual lesion or hypodensities in the posterior left kidney lower pole could represent areas of infarct, focal pyelonephritis or atypical neoplastic lesion. There is also a new indeterminate 1 cm lesion in the medial left kidney Micro Data:  UC  2/9 enterococcus faecalis  UC 2/11>>> BCX2 2/11>>> Antimicrobials:   Zosyn 2/9>>> Interim history/subjective:  Pain worse, having hard time breathing  Objective   Blood pressure (Abnormal) 142/125, pulse (Abnormal) 127, temperature 97.7 F (36.5 C), temperature source Oral, resp. rate (Abnormal) 21, height 5' 0.98" (1.549 m), weight 78.3 kg, SpO2 90 %.        Intake/Output Summary (Last 24 hours) at 12/15/2019 1417 Last data filed at 12/15/2019 1404 Gross per 24 hour  Intake 2131.95 ml  Output 150 ml  Net 1981.95 ml   Filed Weights   12/13/19 2044 12/13/19 2100  Weight: 78.3 kg 78.3 kg    Examination: General: 74 year old female currently lying in bed in mild distress HENT: Normocephalic atraumatic mucous membranes dry neck veins still flat Lungs: Clear, diminished bases, mild accessory  use, mildly tachypneic, currently on 5 L/min oxygen Cardiovascular: Tachycardic regular rhythm Abdomen: Distended, slightly tympanic percussion, pain to palpation.  Positive bowel sounds Extremities: Warm dry brisk cap refill no edema pulses palpable Neuro: Awake oriented no focal deficits GU: Decreased urine output  Resolved Hospital Problem list     Assessment & Plan:   Acute gallstone pancreatitis with worsening SIRS physiology status post ERCP ->lipase increased from 1048->2210 ->WBC bumped s/p ERCP Plan Transfer to intensive care Repeating lactic acid, checking arterial blood gas Get chest x-ray today and 1 view abdomen Trend lipase Continue supplemental IV fluids May need vasoactive to support blood pressure Treat pain  Abdominal pain with ileus Plan NPO Place NGT Bowel rest   Acute hypoxic and hypercarbic respiratory failure.  2/2 worsening atx and decreased cAbd.  She is at risk for acute lung injury and pleural effusions, radiographically I do not see these as of now however her oxygen requirements are progressive Plan Incentive spirometry Pulse ox Supplemental oxygen 1 view abdomen, if has significant gas we will proceed with abdominal decompression with nasogastric tube  Acute on chronic renal failure, underlying CKD stage III Serum creatinine remarkably increased over the last 48 hours, she was 1.09 on presentation now up to 4.29.  Felt like this was ATN from pancreatitis, sepsis, and contrast nephropathy Volume status difficult to assess Currently +2.8 liters Nephrology consulted Plan Continue fluids given an uric state Transferring to ICU Renal dose medications Strict input output Serial chemistries We will go ahead and place dialysis catheter in anticipation for need for dialysis, likely CRRT Holding ace-I   Fluid and electrolyte imbalance: Hyperkalemia, hypomagnesemia Plan Lokelma Follow-up chemistries Replace magnesium  Enterococcus  UTI Plan Cont zosyn day 3   DM type II w/  hyperglycemia Plan ssi  Holding metformin     Best practice:  Diet: N.p.o. Pain/Anxiety/Delirium protocol (if indicated): NA VAP protocol (if indicated): NA DVT prophylaxis: [place] heparin  GI prophylaxis: NA Glucose control: ssi Mobility: BR Code Status: full code  Family Communication: husband updated  Disposition: to ICU. Needs abd decompression and prob CRRT   Labs   CBC: Recent Labs  Lab 12/13/19 1400 12/14/19 0215 12/15/19 0309  WBC 26.4* 12.8* 17.9*  HGB 16.2* 15.6* 12.0  HCT 51.2* 50.6* 38.7  MCV 84.6 86.6 88.4  PLT 356 194 106    Basic Metabolic Panel: Recent Labs  Lab 12/13/19 1400 12/13/19 2230 12/14/19 0215 12/15/19 0309  NA 140 141 139 138  K 3.6 4.8 5.8* 5.7*  CL 101 103 102 103  CO2 22 18* 19* 21*  GLUCOSE 219* 265* 267* 183*  BUN 26* 25* 27* 47*  CREATININE 1.09* 1.57* 1.83* 4.29*  CALCIUM 10.3 9.8 9.7 8.6*  MG  --   --  1.7 1.5*  PHOS  --   --  5.2*  --    GFR: Estimated Creatinine Clearance: 10.7 mL/min (A) (by C-G formula based on SCr of 4.29 mg/dL (H)). Recent Labs  Lab 12/13/19 1400 12/13/19 1620 12/13/19 1855 12/13/19 2230 12/14/19 0215 12/15/19 0309 12/15/19 1011  PROCALCITON  --   --   --  3.47  --   --   --   WBC 26.4*  --   --   --  12.8* 17.9*  --   LATICACIDVEN  --    < > 4.9* 6.2* 4.9*  --  2.2*   < > = values in this interval not displayed.    Liver Function Tests: Recent Labs  Lab 12/13/19 1400 12/13/19 2230 12/14/19 0215 12/15/19 0309  AST 194* 542* 474* 421*  ALT 120* 390* 402* 268*  ALKPHOS 68 78 75 63  BILITOT 2.0* 2.0* 1.7* 1.6*  PROT 7.9 7.1 7.0 6.1*  ALBUMIN 4.9 4.6 4.5 3.4*   Recent Labs  Lab 12/13/19 1400 12/14/19 0215 12/15/19 0309  LIPASE 5,205* 1,048* 2,210*   No results for input(s): AMMONIA in the last 168 hours.  ABG No results found for: PHART, PCO2ART, PO2ART, HCO3, TCO2, ACIDBASEDEF, O2SAT   Coagulation Profile: No results for  input(s): INR, PROTIME in the last 168 hours.  Cardiac Enzymes: Recent Labs  Lab 12/14/19 0215  CKTOTAL 38    HbA1C: Hgb A1c MFr Bld  Date/Time Value Ref Range Status  12/14/2019 02:15 AM 6.4 (H) 4.8 - 5.6 % Final    Comment:    (NOTE)         Prediabetes: 5.7 - 6.4         Diabetes: >6.4         Glycemic control for adults with diabetes: <7.0   08/23/2019 09:19 AM 6.3 4.6 - 6.5 % Final    Comment:    Glycemic Control Guidelines for People with Diabetes:Non Diabetic:  <6%Goal of Therapy: <7%Additional Action Suggested:  >8%     CBG: Recent Labs  Lab 12/14/19 2001 12/15/19 0028 12/15/19 0426 12/15/19 0813 12/15/19 1229  GLUCAP 171* 154* 155* 166* 174*    Review of Systems:   Review of Systems  Constitutional: Positive for fever and malaise/fatigue.  HENT: Negative.   Eyes: Negative.   Respiratory: Positive for shortness of breath.   Cardiovascular: Positive for leg swelling.  Gastrointestinal: Positive for abdominal pain and diarrhea.  Genitourinary: Negative.   Musculoskeletal: Negative.  Skin: Negative.   Neurological: Negative.   Endo/Heme/Allergies: Negative.   Psychiatric/Behavioral: Negative.      Past Medical History  She,  has a past medical history of Allergic rhinitis, Arthritis, Breast mass, right (08/2014), Colon polyp (09/2008), 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 nephropathy ([place]), Frequent epistaxis (05/16/2019), GERD (gastroesophageal reflux disease), Hepatic steatosis, History of chicken pox, History of measles, History of recurrent UTIs, HLD (hyperlipidemia), HTN (hypertension), Hypertensive retinopathy of both eyes, grade 1 (06/2014), Kidney cyst, acquired (01/2016), Kidney stone (01/2016), Lung nodules (11/2013), Osteopenia (06/2013), Polycythemia, Primary localized osteoarthritis of right knee (01/05/2019), and Rosacea.   Surgical History    Past Surgical History:  Procedure Laterality Date  . APPENDECTOMY  1987  . BREAST BIOPSY  Right 1963   benign  . BREAST BIOPSY Right 08/2014   benign- core  . cardiolite stress test  04/2004   normal  . CESAREAN SECTION  7371;0626   x2  . CHOLECYSTECTOMY  2003  . COLONOSCOPY  09/26/2008   adenomatous polyp, rpt 3-5 yrs  . COLONOSCOPY  08/2012   adenomatous polyps, diverticulosis, rec rpt 5 yrs [name])  . COLONOSCOPY WITH PROPOFOL N/A 02/05/2018   4TA, SSA, diverticulosis, rpt 3 yrs [name], [name], MD)  . dexa  2003   normal  . dexa  06/2013   [facility] - Tscore -1.1 forearm, normal spine and femur  . 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 TRANSTHORACIC ECHOCARDIOGRAM  04/2019   EF 55-60%, modLVH, impaired relaxation   . TRIGGER FINGER RELEASE  2007;2010;2011   bilateral  . TRIGGER FINGER RELEASE  02/2017  . VAGINAL HYSTERECTOMY  1984   for menorrhagia, ovaries in place     Social History   reports that she has never smoked. She has never used smokeless tobacco. She reports previous alcohol use of about 1.0 - 2.0 standard drinks of alcohol per week. She reports that she does not use drugs.   Family History   Her family history includes Cancer in her father and paternal aunt; Coronary artery disease in her maternal grandfather and maternal grandmother; Coronary artery disease (age of onset: 46) in her father; Diabetes in her maternal grandfather; Hyperlipidemia in her father and mother; Hypertension in her father and mother; Stroke in her mother. There is no history of Breast cancer.   Allergies Allergies  Allergen Reactions  . Azithromycin Itching    Okay if takes benadryl along with it  . Nickel     Reaction to cheap earrings  . Sulfa Antibiotics   . Adhesive [Tape] Rash    Paper tape - blisters     Home Medications  Prior to Admission medications   Medication Sig Start Date End Date Taking? Authorizing Provider  insulin  aspart (NOVOLOG) 100 UNIT/ML injection Inject 0-6 Units into the skin every 4 (four) hours. 12/13/19   Dhungel, Nishant, MD  ondansetron (ZOFRAN) 4 MG tablet Take 1 tablet (4 mg total) by mouth every 6 (six) hours as needed for nausea. 12/13/19   Dhungel, Nishant, MD  pantoprazole (PROTONIX) 40 MG injection Inject 40 mg into the vein daily. 12/13/19   Louellen Molder, MD     Critical care time: 32 minutes     Erick Colace ACNP-BC [facility] Pager # [phone] OR # [phone] if no answer

## 2019-12-15 NOTE — Progress Notes (Addendum)
Bladder Pressure completed on Pt at approximately 1200 this day, 16, completed by Harlin Heys, RN. Jocelyn Lamer from Surgery was notified. Dr. Posey Pronto ordered to continue every 4 hours, and notify team with increase pressure.

## 2019-12-15 NOTE — Progress Notes (Signed)
eLink Physician-Brief Progress Note Patient Name: Katherine Oliver DOB: June 22, 1946 MRN: [id number]   Date of Service  12/15/2019  HPI/Events of Note  Patient c/o due to pancreatitis. Patient was on Dilaudid PCA prior to ICU transfer. Now on no pain medications. Patient is on HFNC O2.  eICU Interventions  Will order: 1. Dilaudid 0.5 mg IV Q 2 hours PRN pain.      Intervention Category Major Interventions: Other:  Lysle Dingwall 12/15/2019, 9:53 PM

## 2019-12-16 ENCOUNTER — Inpatient Hospital Stay (HOSPITAL_COMMUNITY): Payer: Medicare Other

## 2019-12-16 LAB — URINE CULTURE
Culture: NO GROWTH
Special Requests: NORMAL

## 2019-12-16 LAB — POCT I-STAT 7, (LYTES, BLD GAS, ICA,H+H)
Acid-base deficit: 5 mmol/L — ABNORMAL HIGH (ref 0.0–2.0)
Bicarbonate: 20.7 mmol/L (ref 20.0–28.0)
Calcium, Ion: 1.09 mmol/L — ABNORMAL LOW (ref 1.15–1.40)
HCT: 31 % — ABNORMAL LOW (ref 36.0–46.0)
Hemoglobin: 10.5 g/dL — ABNORMAL LOW (ref 12.0–15.0)
O2 Saturation: 90 %
Potassium: 4.5 mmol/L (ref 3.5–5.1)
Sodium: 136 mmol/L (ref 135–145)
TCO2: 22 mmol/L (ref 22–32)
pCO2 arterial: 40.1 mmHg (ref 32.0–48.0)
pH, Arterial: 7.32 — ABNORMAL LOW (ref 7.350–7.450)
pO2, Arterial: 63 mmHg — ABNORMAL LOW (ref 83.0–108.0)

## 2019-12-16 LAB — COMPREHENSIVE METABOLIC PANEL
ALT: 144 U/L — ABNORMAL HIGH (ref 0–44)
AST: 104 U/L — ABNORMAL HIGH (ref 15–41)
Albumin: 2.8 g/dL — ABNORMAL LOW (ref 3.5–5.0)
Alkaline Phosphatase: 71 U/L (ref 38–126)
Anion gap: 16 — ABNORMAL HIGH (ref 5–15)
BUN: 31 mg/dL — ABNORMAL HIGH (ref 8–23)
CO2: 22 mmol/L (ref 22–32)
Calcium: 7.8 mg/dL — ABNORMAL LOW (ref 8.9–10.3)
Chloride: 101 mmol/L (ref 98–111)
Creatinine, Ser: 3.1 mg/dL — ABNORMAL HIGH (ref 0.44–1.00)
GFR calc Af Amer: 16 mL/min — ABNORMAL LOW (ref >60)
GFR calc non Af Amer: 14 mL/min — ABNORMAL LOW (ref >60)
Glucose, Bld: 142 mg/dL — ABNORMAL HIGH (ref 70–99)
Potassium: 4.3 mmol/L (ref 3.5–5.1)
Sodium: 139 mmol/L (ref 135–145)
Total Bilirubin: 1.5 mg/dL — ABNORMAL HIGH (ref 0.3–1.2)
Total Protein: 5.4 g/dL — ABNORMAL LOW (ref 6.5–8.1)

## 2019-12-16 LAB — RENAL FUNCTION PANEL
Albumin: 2.5 g/dL — ABNORMAL LOW (ref 3.5–5.0)
Anion gap: 18 — ABNORMAL HIGH (ref 5–15)
BUN: 26 mg/dL — ABNORMAL HIGH (ref 8–23)
CO2: 20 mmol/L — ABNORMAL LOW (ref 22–32)
Calcium: 7.3 mg/dL — ABNORMAL LOW (ref 8.9–10.3)
Chloride: 101 mmol/L (ref 98–111)
Creatinine, Ser: 2.89 mg/dL — ABNORMAL HIGH (ref 0.44–1.00)
GFR calc Af Amer: 18 mL/min — ABNORMAL LOW (ref >60)
GFR calc non Af Amer: 15 mL/min — ABNORMAL LOW (ref >60)
Glucose, Bld: 169 mg/dL — ABNORMAL HIGH (ref 70–99)
Phosphorus: 3.1 mg/dL (ref 2.5–4.6)
Potassium: 4.3 mmol/L (ref 3.5–5.1)
Sodium: 139 mmol/L (ref 135–145)

## 2019-12-16 LAB — CBC
HCT: 35 % — ABNORMAL LOW (ref 36.0–46.0)
Hemoglobin: 10.9 g/dL — ABNORMAL LOW (ref 12.0–15.0)
MCH: 27.8 pg (ref 26.0–34.0)
MCHC: 31.1 g/dL (ref 30.0–36.0)
MCV: 89.3 fL (ref 80.0–100.0)
Platelets: 127 10*3/uL — ABNORMAL LOW (ref 150–400)
RBC: 3.92 MIL/uL (ref 3.87–5.11)
RDW: 14.7 % (ref 11.5–15.5)
WBC: 15.6 10*3/uL — ABNORMAL HIGH (ref 4.0–10.5)
nRBC: 0.4 % — ABNORMAL HIGH (ref 0.0–0.2)

## 2019-12-16 LAB — GLUCOSE, CAPILLARY
Glucose-Capillary: 134 mg/dL — ABNORMAL HIGH (ref 70–99)
Glucose-Capillary: 149 mg/dL — ABNORMAL HIGH (ref 70–99)
Glucose-Capillary: 146 mg/dL — ABNORMAL HIGH (ref 70–99)
Glucose-Capillary: 149 mg/dL — ABNORMAL HIGH (ref 70–99)
Glucose-Capillary: 170 mg/dL — ABNORMAL HIGH (ref 70–99)

## 2019-12-16 LAB — LACTIC ACID, PLASMA: Lactic Acid, Venous: 1.6 mmol/L (ref 0.5–1.9)

## 2019-12-16 LAB — MAGNESIUM: Magnesium: 2.3 mg/dL (ref 1.7–2.4)

## 2019-12-16 LAB — LIPASE, BLOOD: Lipase: 173 U/L — ABNORMAL HIGH (ref 11–51)

## 2019-12-16 LAB — PHOSPHORUS: Phosphorus: 3.4 mg/dL (ref 2.5–4.6)

## 2019-12-16 LAB — AMMONIA: Ammonia: 47 umol/L — ABNORMAL HIGH (ref 9–35)

## 2019-12-16 MED ORDER — METOCLOPRAMIDE HCL 5 MG/ML IJ SOLN
5.0000 mg | Freq: Once | INTRAMUSCULAR | Status: AC
  Administered 2019-12-16: 5 mg via INTRAVENOUS

## 2019-12-16 MED ORDER — PRO-STAT SUGAR FREE PO LIQD: 30.0000 mL | Freq: Two times a day (BID) | ORAL | Status: DC

## 2019-12-16 MED ORDER — METOPROLOL TARTRATE 5 MG/5ML IV SOLN
INTRAVENOUS | Status: AC
  Filled 2019-12-16: qty 5

## 2019-12-16 MED ORDER — DILTIAZEM LOAD VIA INFUSION
15.0000 mg | Freq: Once | INTRAVENOUS | Status: AC
  Administered 2019-12-16: 15 mg via INTRAVENOUS
  Filled 2019-12-16: qty 15

## 2019-12-16 MED ORDER — B COMPLEX-C PO TABS
1.0000 | ORAL_TABLET | Freq: Every day | ORAL | Status: DC
  Administered 2019-12-17 – 2019-12-21 (×4): 1
  Filled 2019-12-16 (×7): qty 1

## 2019-12-16 MED ORDER — FENTANYL CITRATE (PF) 100 MCG/2ML IJ SOLN
25.0000 ug | INTRAMUSCULAR | Status: DC | PRN
  Administered 2019-12-16 – 2019-12-21 (×14): 25 ug via INTRAVENOUS
  Filled 2019-12-16 (×14): qty 2

## 2019-12-16 MED ORDER — VITAL AF 1.2 CAL PO LIQD
1000.0000 mL | ORAL | Status: DC
  Administered 2019-12-16: 21:00:00 1000 mL

## 2019-12-16 MED ORDER — DILTIAZEM HCL-DEXTROSE 125-5 MG/125ML-% IV SOLN (PREMIX)
5.0000 mg/h | INTRAVENOUS | Status: DC
  Administered 2019-12-16 – 2019-12-17 (×3): 5 mg/h via INTRAVENOUS
  Filled 2019-12-16 (×4): qty 125

## 2019-12-16 NOTE — Progress Notes (Addendum)
NAME:  Katherine Oliver, MRN:  [id number], DOB:  07/31/1946, LOS: 3 ADMISSION DATE:  12/13/2019, CONSULTATION DATE:  2/11 REFERRING MD:  Posey Pronto, CHIEF COMPLAINT:   Worsening SIRS/respiratory failure  Brief History   74 year old female admitted on 2/9 with gallstone pancreatitis and urinary tract infection.  Underwent ERCP on 2/10 with successful stent placement.  Critical care consulted on 2/11 as patient was developing worsening pancreatic pain, increased tachycardia, increased work of breathing with supplemental oxygen need and worsening renal function.  History of present illness    74 year old female with history as mentioned below admitted on 2/9 with chief complaint of nausea vomiting diarrhea and abdominal pain.  Had been started on Macrobid day prior for urinary tract infection.  In ER diagnostic evaluation on 2/9 showed lactic acid up to 5, elevated LFTs with lipase 5200.  A CT of chest was obtained to rule out pulmonary embolus but instead showed possible pancreatic necrosis, no evidence of pseudocyst.  She was transferred to [facility] as there was no coverage at [facility]. A follow-up CT abdomen was obtained this showed calcified stone in the ampulla with mild duct dilation.  She was diagnosed with gallstone pancreatitis and gastroenterology was consulted with plan for ERCP and stone extraction on 2/10. Hospital course: Admitted to the hospitalist service on 2/9, IV fluids initiated, Zosyn started for leukocytosis, and outpatient Enterococcus urinary tract infection.  Tachycardic on telemetry. 2/10: Underwent ERCP by Dr. Benson Norway: Noted be technically difficult and complex, successful with biliary sphincterotomy performed and plastic stent placed into the common bile duct she returned to the stepdown unit 2/11: Having worse pain but apparently initially improved in the early morning, no function remarkably worse increased from 1.57 on admission to 4.29.  She was started on PCA for  pancreatitis related pain, nephrology consulted for worsening acute on chronic renal failure, surgery consulted for increased intrabladder pressure and concern for possible abdominal compartment syndrome, and critical care consulted for concern about overall clinical decline 2/12>> Remains on 10 L HF oxygen, RR 24 and HR 113, LFT's down trending   Past Medical History  CKD stage III, allergic rhinitis, type 2 diabetes, GERD, hepatic steatosis, polycythemia  Significant Hospital Events   Admitted to the hospitalist service on 2/9, IV fluids initiated, Zosyn started for leukocytosis, and outpatient Enterococcus urinary tract infection.  Tachycardic on telemetry. 2/10: Underwent ERCP by Dr. Benson Norway: Noted be technically difficult and complex, successful with biliary sphincterotomy performed and plastic stent placed into the common bile duct she returned to the stepdown unit 2/11: Having worse pain but apparently initially improved in the early morning, no function remarkably worse increased from 1.57 on admission to 4.29.  She was started on PCA for pancreatitis related pain, nephrology consulted for worsening acute on chronic renal failure, surgery consulted for increased intrabladder pressure and concern for possible abdominal compartment syndrome, and critical care consulted for concern about overall clinical decline  Consults:  GI ->Hung consulted 2/9 General surg consulted 2/11 for concern about abd compartment  Renal consulted for Acute on chronic renal failure, hyperkalemia, lactic acidosis  Procedures:  ERCP 2/10: completed successfully w/ stent placement   Significant Diagnostic Tests:  2/9 CT angio: 1. Acute pancreatitis likely secondary to an obstructive stone in the distal common bile duct near the ampulla. 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 No evidence for pseudocyst formations at time. Mild intrahepatic and extrahepatic biliary dilatation. Subtle  low-density along the posterior right hepatic lobe may be  related to inflammation but this area is indeterminate. 2. Indeterminate left renal lesions. Unusual lesion or hypodensities in the posterior left kidney lower pole could represent areas of infarct, focal pyelonephritis or atypical neoplastic lesion. There is also a new indeterminate 1 cm lesion in the medial left kidney. Recommend follow-up renal MRI when patient is stable and can not tolerate an abdominal MRI. 2/11 CT abd/pelvis 1. Acute pancreatitis likely secondary to an obstructive stone in the distal common bile duct near the ampulla. 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 No evidence for pseudocyst formations at time. Mild intrahepatic and extrahepatic biliary dilatation. Subtle low-density along the posterior right hepatic lobe may be related to inflammation but this area is indeterminate. 2. Indeterminate left renal lesions. Unusual lesion or hypodensities in the posterior left kidney lower pole could represent areas of infarct, focal pyelonephritis or atypical neoplastic lesion. There is also a new indeterminate 1 cm lesion in the medial left kidney Micro Data:  UC  2/9 enterococcus faecalis  UC 2/11>>> BCX2 2/11>>> Antimicrobials:   Zosyn 2/9>>> Interim history/subjective:  Remains on 10 L HF [place] with sats of 95%.  Continues to complain of 9/10 pain, Dilaudid d/c'd due to hallucinations overnight, so no pain medication ordered.  LFT's are down trending. Creatinine down trending on CVVHD, currently keeping even.  HGB drop 1 gram, with platelet drop to 127,000 ( 151,000) Afebrile + 4 L 288 cc UO last 24 hours lipase and lactate pending 2/12CXR with Slight increase in pleurals effusions bilaterally R>L. Bibasilar atelectatic changes are noted.  Objective   Blood pressure (!) 155/79, pulse (!) 113, temperature (!) 97.3 F (36.3 C), temperature source Oral, resp. rate (!) 24, height 5' 0.98"  (1.549 m), weight 80.3 kg, SpO2 95 %. CVP:  [10 mmHg-21 mmHg] 12 mmHg      Intake/Output Summary (Last 24 hours) at 12/16/2019 0826 Last data filed at 12/16/2019 0800 Gross per 24 hour  Intake 2613.04 ml  Output 1323 ml  Net 1290.04 ml   Filed Weights   12/13/19 2044 12/13/19 2100 12/16/19 0600  Weight: 78.3 kg 78.3 kg 80.3 kg    Examination: General: 74 year old female currently lying in bed in mild distress, complaining of pain HENT: Normocephalic atraumatic mucous membranes dry neck veins still flat Lungs: Clear, diminished bases, mild accessory use,  tachypneic, currently on 10 L/min oxygen Cardiovascular: Tachycardic regular rhythm, S1, S2 No MRG Abdomen: Distended, slightly tympanic percussion, pain to palpation.  Diminished  bowel sounds Extremities: Warm and dry  cap refill < 3 seconds, no edema pulses palpable Neuro: Awake oriented to self and place,  no focal deficits, no hallucinations at present. GU: Continued Decreased urine output  Resolved Hospital Problem list     Assessment & Plan:   Acute gallstone pancreatitis with worsening SIRS physiology status post ERCP ->lipase increased from 1048->2210 on 2/11 ->WBC bumped s/p ERCP, now with slight down trend Plan Lactic acid now Trend CXR Trend lipase Continue supplemental IV fluids MAP goal > 65 mm Hg Treat pain  Abdominal pain with ileus Continued pain 9/10 per patient 2/12 Pain medication discontinued overnight for hallucinations 550 cc out per OG Plan NPO Continue  NGT to suction for abdominal decompression Bowel rest  Will add Fentanyl 25 mcg Q 2 prn pain  Acute hypoxic and hypercarbic respiratory failure.  2/2 worsening atx and decreased cAbd.  At risk for acute lung injury and pleural effusions RR 21-24  Now requiring 10 L HFNC CXR with  increasing pleural effusions R>L Plan ABG now and prn  Incentive spirometry Bed in chair position as able  Pulse ox for saturation goal of >  92% Supplemental oxygen Consider setting slight net negative per CRRT  Acute on chronic renal failure, underlying CKD stage III Serum creatinine remarkably increased over the last 48 hours, she was 1.09 on presentation now up to 4.29.   Felt  this was ATN from pancreatitis, sepsis, and contrast nephropathy Volume status difficult to assess Currently +4 L  CRRT initiated 2/11 Plan Continue fluids given an uric state Renal dose medications Strict input output Serial chemistries Continue CRRT Appreciate Nephrology assistance  Holding ace-I   Thrombocytopenia Plan Trend CBC  Transfuse platelets if < 50,000 Monitor for bleeding  Fluid and electrolyte imbalance: Hyperkalemia>> resolved 2/12, treated with Lokelma Hypomagnesemia>> resolved 2/12, repleted Plan Trend  BMET and Mag Replete electrolytes as needed  Enterococcus UTI Plan Cont zosyn day 4 Trend WBC and fever curve Culture as is clinically indicated   DM type II w/ hyperglycemia Plan ssi  Holding metformin   Hallucination overnight 2/11-2/12 Pain medication was discontinued as a result Pt has not slept much in the last several days Possible she could also be sun downing Plan PRN Pain medication added as above for sleep Lights on during the day, glasses on Frequent re-orientation Check Ammonia Level  Nutrition Currently decompressing abdomen 550 cc out per NG last 12 hours Plan Decision on when to transition from decompressing abdomen to trickle feeds per GI Will go ahead and have CORTRAK placed today in preparation for trickle feeds once GI ok's  Concerning respiratory status Will try NRB as patient is a mouth breather Will make net negative 100 cc's per hour ( per renal) to help with effusions.  IS Q 2 while awake   Best practice:  Diet: NPO Pain/Anxiety/Delirium protocol (if indicated):prn fentanyl VAP protocol (if indicated): NA DVT prophylaxis:  heparin  GI prophylaxis: NA Glucose  control: ssi Mobility: BR Code Status: full code  Family Communication:  Disposition: to ICU. /CRRT   Labs    CMP Latest Ref Rng & Units 12/16/2019 12/16/2019 12/15/2019  Glucose 70 - 99 mg/dL - 142(H) 189(H)  BUN 8 - 23 mg/dL - 31(H) 58(H)  Creatinine 0.44 - 1.00 mg/dL - 3.10(H) 5.33(H)  Sodium 135 - 145 mmol/L 136 139 138  Potassium 3.5 - 5.1 mmol/L 4.5 4.3 5.4(H)  Chloride 98 - 111 mmol/L - 101 103  CO2 22 - 32 mmol/L - 22 21(L)  Calcium 8.9 - 10.3 mg/dL - 7.8(L) 8.3(L)  Total Protein 6.5 - 8.1 g/dL - 5.4(L) -  Total Bilirubin 0.3 - 1.2 mg/dL - 1.5(H) -  Alkaline Phos 38 - 126 U/L - 71 -  AST 15 - 41 U/L - 104(H) -  ALT 0 - 44 U/L - 144(H) -   CBC Latest Ref Rng & Units 12/16/2019 12/16/2019 12/15/2019  WBC 4.0 - 10.5 K/uL - 15.6(H) 17.9(H)  Hemoglobin 12.0 - 15.0 g/dL 10.5(L) 10.9(L) 12.0  Hematocrit 36.0 - 46.0 % 31.0(L) 35.0(L) 38.7  Platelets 150 - 400 K/uL - 127(L) 151   ABG    Component Value Date/Time   PHART 7.320 (L) 12/16/2019 0954   PCO2ART 40.1 12/16/2019 0954   PO2ART 63.0 (L) 12/16/2019 0954   HCO3 20.7 12/16/2019 0954   TCO2 22 12/16/2019 0954   ACIDBASEDEF 5.0 (H) 12/16/2019 0954   O2SAT 90.0 12/16/2019 0954   CBG (last 3)  Recent Labs  12/15/19 2352 12/16/19 0417 12/16/19 0801  GLUCAP 156* 134* 149*       Critical care time: 35  minutes    Magdalen Spatz, MSN, AGACNP-BC [place] Pager # [phone] After 4 pm please call [phone] 12/16/2019  9:19 AM   Attending section:  C/o abdominal pain.  On CRRT.    BP (!) 168/74   Pulse (!) 103   Temp (!) 97.3 F (36.3 C) (Oral)   Resp 16   Ht 5' 0.98" (1.549 m)   Wt 80.3 kg   SpO2 100%   BMI 33.47 kg/m   Alert.  HR regular.  No wheeze.  Mild abd distention with mid epigastric tenderness.  1+ edema.  Follows commands  A/p  Gallstone pancreatitis. - advance diet when okay with GI  AKI from ATN. - CRRT - goal negative fluid balance  Pain  control. - need to be cautious with opiates due to concern for delirium  Enterococcal UTI. - continue ABx  Acute hypoxic, hypercapnic respiratory failure from pleural effusion and ATX. - monitor need for intubation  CC time by me independent of APP time 32 minutes  Chesley Mires, MD [facility] Pulmonary/Critical Care 12/16/2019, 11:31 AM

## 2019-12-16 NOTE — Progress Notes (Signed)
[facility] Progress Note Patient Name: Fritzi Scripter DOB: 1946-07-08 MRN: [id number]   Date of Service  12/16/2019  HPI/Events of Note  Hallucinations  eICU Interventions  Will D/C Dilaudid.      Intervention Category Major Interventions: Delirium, psychosis, severe agitation - evaluation and management  Abrian Hanover [place] 12/16/2019, 7:04 AM

## 2019-12-16 NOTE — Progress Notes (Signed)
CRRT restarted at 2200 after patency and placement verified by Dr. Emmit Alexanders.

## 2019-12-16 NOTE — Telephone Encounter (Addendum)
Spoke with husband re wife currently in the hospital.  He has been able to visit with her in the hospital.

## 2019-12-16 NOTE — Progress Notes (Signed)
eLink Physician-Brief Progress Note Patient Name: Veneda Kirksey DOB: 06/23/1946 MRN: [id number]   Date of Service  12/16/2019  HPI/Events of Note  Request to review CXR for kinked R IJ dialysis catheter. I do not appreciate any kink in the R IJ dialysis catheter.   eICU Interventions  Continue present management.      Intervention Category Major Interventions: Other:  Lysle Dingwall 12/16/2019, 9:00 PM

## 2019-12-16 NOTE — Progress Notes (Signed)
Initial Nutrition Assessment  DOCUMENTATION CODES:   Not applicable  INTERVENTION:   Recommend early initiation of Enteral Nutrition. Recommend placement of Cortrak today with initiation of TF; pt  needs post-pyloric placement,at the LOT if feasible. NG tube for decompression to remain in place, feeding into small bowel  Tube Feeding:  Vital AF 1.2 at 20 ml/hr; goal rate of 60 ml/hr Slow titration to goal rate; increase by 10 mL q 12 hours Goal rate provides 1728 kcals, 108 g of protein and 1166 mL of free water Meets 100% estimated calorie and protein needs  Of note, pt with significant abdominal pain prior to initiation of TF related to pancreatitis; pt will likely continue to experience pain once TF initiated. Abdominal pain alone, in this patient, should not be a sole reason for holding TF.    Add B-complex with C while on CRRT  NUTRITION DIAGNOSIS:   Inadequate oral intake related to altered GI function, acute illness as evidenced by NPO status.  GOAL:   Patient will meet greater than or equal to 90% of their needs  MONITOR:   Diet advancement, TF tolerance, Skin, Labs, Weight trends  REASON FOR ASSESSMENT:   Rounds    ASSESSMENT:   74 yo female admitted with severe sepsis with multi-orgran failure secondary to acute gallstone pancreatitis. PMH includes HTN, DM, CKD III, GERD, hepatic steatosis   RD working remotely.  2.09 Admitted 2/10 ERCP with stent placement 2/11 Transfer to ICU, CRRT initiated  Currently on 10 L HFNC, CRRT Pt with continued abdominal pain, 9/10 per MD note  Pt evaluated by surgery service and determined pt does not have abdominal copmpartment syndrome and no need for surgery at this time  Abdomen distended, BS present, small stool this AM, NG to LIS for decompression, 550 mL bilious return over night.   Net + 4 L. Oliguric. Admit weight 78 kg, current weight 80.3 kg  Discussed nutrition poc with GI and CCM; plan for post-pyloric  Cortrak tube placement (LOT if possible) with plans to initiate EN. Of note, pt with abdominal pain prior to initiation of TF  Labs: Creatinine 3.10, BUN 31, CBGs 134-179 Meds: ss novolog, lantus, LR at 50 ml/hr, reglan prn  Diet Order:   Diet Order            Diet NPO time specified Except for: Sips with Meds  Diet effective now              EDUCATION NEEDS:   Not appropriate for education at this time  Skin:  Skin Assessment: Reviewed RN Assessment  Last BM:  2/12  Height:   Ht Readings from Last 1 Encounters:  12/13/19 5' 0.98" (1.549 m)    Weight:   Wt Readings from Last 1 Encounters:  12/16/19 80.3 kg   BMI:  Body mass index is 33.47 kg/m.  Estimated Nutritional Needs:   Kcal:  1700-1900 kcals  Protein:  90-105 g  Fluid:  >/= 1.8 L   Kerman Passey MS, RDN, LDN, CNSC RD Pager Number and Weekend/On-Call After Hours Pager Located in [place]

## 2019-12-16 NOTE — Telephone Encounter (Addendum)
Spoke with pt's husband, [name] (on dpr), asking for update on pt.  Says she is not doing well.  Pt is in ICU.  Says her kidneys are not functioning well, not putting out much urine.  Have pt on dialysis.  Mr. Karie Schwalbe says he can be reached on his cell # [phone].  FYI to Dr. Darnell Level.

## 2019-12-16 NOTE — Procedures (Signed)
Cortrak  Tube Type:  Cortrak - 43 inches Tube Location:  Left nare Initial Placement:  Postpyloric Secured by: Bridle Technique Used to Measure Tube Placement:  Documented cm marking at nare/ corner of mouth Cortrak Secured At:  93 cm    Cortrak Tube Team Note:  Consult received to place a Cortrak feeding tube.   X-ray is required, abdominal x-ray has been ordered by the Cortrak team. Please confirm tube placement before using the Cortrak tube.   If the tube becomes dislodged please keep the tube and contact the Cortrak team at [email or web address] ([email or web address]) for replacement.  If after hours and replacement cannot be delayed, place a NG tube and confirm placement with an abdominal x-ray.    Koleen Distance MS, RD, LDN Contact information available in Amion

## 2019-12-16 NOTE — Progress Notes (Signed)
Subjective: Feeling better, but still with 9/10 abdominal pain.  Objective: Vital signs in last 24 hours: Temp:  [97.3 F (36.3 C)-98 F (36.7 C)] 97.3 F (36.3 C) (02/12 0400) Pulse Rate:  [109-134] 113 (02/12 0640) Resp:  [14-31] 27 (02/12 0640) BP: (85-166)/(56-146) 148/62 (02/12 0640) SpO2:  [87 %-100 %] 93 % (02/12 0640) Weight:  [80.3 kg] 80.3 kg (02/12 0600) Last BM Date: 12/13/19  Intake/Output from previous day: 02/11 0701 - 02/12 0700 In: 2563 [I.V.:2241.5; IV Piggyback:181.5] Out: 1274 [Urine:288; Emesis/NG output:550] Intake/Output this shift: No intake/output data recorded.  General appearance: alert and fatigued Resp: clear to auscultation bilaterally Cardio: regular rate and rhythm GI: softer, tender in the upper abdomen Extremities: extremities normal, atraumatic, no cyanosis or edema  Lab Results: Recent Labs    12/14/19 0215 12/15/19 0309 12/16/19 0446  WBC 12.8* 17.9* 15.6*  HGB 15.6* 12.0 10.9*  HCT 50.6* 38.7 35.0*  PLT 194 151 127*   BMET Recent Labs    12/15/19 0309 12/15/19 1500 12/16/19 0446  NA 138 138 139  K 5.7* 5.4* 4.3  CL 103 103 101  CO2 21* 21* 22  GLUCOSE 183* 189* 142*  BUN 47* 58* 31*  CREATININE 4.29* 5.33* 3.10*  CALCIUM 8.6* 8.3* 7.8*   LFT Recent Labs    12/16/19 0446  PROT 5.4*  ALBUMIN 2.8*  AST 104*  ALT 144*  ALKPHOS 71  BILITOT 1.5*   PT/INR No results for input(s): LABPROT, INR in the last 72 hours. Hepatitis Panel No results for input(s): HEPBSAG, HCVAB, HEPAIGM, HEPBIGM in the last 72 hours. C-Diff No results for input(s): CDIFFTOX in the last 72 hours. Fecal Lactopherrin No results for input(s): FECLLACTOFRN in the last 72 hours.  Studies/Results: DG Abd 1 View  Result Date: 12/14/2019 CLINICAL DATA:  74 year old with acute pancreatitis and acute onset of abdominal distention. EXAM: ABDOMEN - 1 VIEW COMPARISON:  CT abdomen and pelvis yesterday. FINDINGS: Since the CT yesterday, development  of borderline to mild gaseous distension of small bowel throughout the abdomen. Gas within normal caliber ascending colon. Remainder of the colon decompressed as on yesterday's CT. Residual contrast material in the renal parenchyma bilaterally and in the urinary bladder related to yesterday's CT, query acute kidney injury. No suggestion of free air on the supine image. Surgical clips in the RIGHT UPPER QUADRANT from prior cholecystectomy. IMPRESSION: 1. Mild generalized ileus. No evidence of bowel obstruction currently. 2. Residual contrast material in the renal parenchyma bilaterally and in the urinary bladder related to yesterday's CT, query acute kidney injury/renal insufficiency. Electronically Signed   By: Evangeline Dakin M.D.   On: 12/14/2019 09:37   US RENAL  Result Date: 12/15/2019 CLINICAL DATA:  Acute kidney injury. EXAM: RENAL / URINARY TRACT ULTRASOUND COMPLETE COMPARISON:  Abdominal CT 2 days prior 12/13/2019 FINDINGS: Right Kidney: Renal measurements: 11.7 x 5.6 x 5.4 cm = volume: 186 mL. Echogenicity within normal limits. No mass or hydronephrosis visualized. Tiny hypodensity in the lower right kidney is not well visualized sonographically. Left Kidney: Renal measurements: 12.7 x 5.0 x 4.8 cm = volume: 157 mL. Echogenicity within normal limits. No solid mass or hydronephrosis visualized. Cyst measuring 1.5 x 1.6 x 1.2 cm in the lower kidney. The additional low-density lesion on prior CT is not well visualized. Bladder: Decompressed by Foley catheter. Other: Hepatic steatosis incidentally noted. IMPRESSION: 1. No hydronephrosis or obstructive uropathy. 2. Small cyst in the lower left kidney. Additional indeterminate left renal lesions on CT are not  well visualized sonographically. As recommended on prior CT, elective follow-up renal protocol MRI is recommended when patient is able and can tolerate breath hold technique. Electronically Signed   By: Keith Rake M.D.   On: 12/15/2019 15:01    DG Chest Port 1 View  Result Date: 12/15/2019 CLINICAL DATA:  74 year old female central line placement. EXAM: PORTABLE CHEST 1 VIEW COMPARISON:  1436 hours today. FINDINGS: Portable AP semi upright view at 1607 hours. Right IJ approach central line placed, tip at the level of the lower SVC. No pneumothorax. Stable cardiac size and mediastinal contours. Stable low lung volumes. Mildly increased perihilar platelike opacity most resembling atelectasis. Otherwise stable ventilation. Enteric tube also placed and courses to the stomach with side hole at the level of the gastric body. There is a small volume of contrast in the gastric fundus. IMPRESSION: 1. Right IJ central line placed, tip at the lower SVC level. No adverse features. 2. Enteric tube placed, side hole at the level of the gastric body. 3. Continued low lung volumes with increased perihilar atelectasis from earlier today. Electronically Signed   By: Genevie Ann M.D.   On: 12/15/2019 16:22   DG CHEST PORT 1 VIEW  Result Date: 12/15/2019 CLINICAL DATA:  Hypoxia, abdominal pain EXAM: PORTABLE CHEST 1 VIEW COMPARISON:  12/14/2019 FINDINGS: Single frontal view of the chest demonstrates a stable cardiac silhouette. Continued low lung volumes. Increasing consolidation at the left lung base. No large effusion or pneumothorax. IMPRESSION: 1. Progressive left lower lobe consolidation favor atelectasis. Electronically Signed   By: Randa Ngo M.D.   On: 12/15/2019 15:07   DG Chest Port 1 View  Result Date: 12/14/2019 CLINICAL DATA:  74 year old with acute pancreatitis and shortness of breath. EXAM: PORTABLE CHEST 1 VIEW COMPARISON:  12/29/2018 and earlier. FINDINGS: Markedly suboptimal inspiration accounts for atelectasis in the lung bases, LEFT greater than RIGHT. Lungs otherwise clear. Pulmonary vascularity normal. Cardiac silhouette normal in size for AP portable technique and degree of inspiration. IMPRESSION: Markedly suboptimal inspiration accounts  for bibasilar atelectasis, LEFT greater than RIGHT. Electronically Signed   By: Evangeline Dakin M.D.   On: 12/14/2019 09:38   DG ERCP BILIARY & PANCREATIC DUCTS  Result Date: 12/14/2019 CLINICAL DATA:  74 year old female with choledocholithiasis EXAM: ERCP TECHNIQUE: Multiple spot images obtained with the fluoroscopic device and submitted for interpretation post-procedure. FLUOROSCOPY TIME:  Fluoroscopy Time:  1 minutes 34 seconds Radiation Exposure Index (if provided by the fluoroscopic device): 24.6 mGy Number of Acquired Spot Images: 0 COMPARISON:  CT scan of the abdomen and pelvis 12/13/2019 FINDINGS: Two intraoperative saved images are submitted for review. The images demonstrate a flexible endoscope in the descending duodenum with wire cannulation of the common bile duct. The second image demonstrates placement of a plastic biliary stent. IMPRESSION: ERCP with placement of plastic biliary stent. These images were submitted for radiologic interpretation only. Please see the procedural report for the amount of contrast and the fluoroscopy time utilized. Electronically Signed   By: Jacqulynn Cadet M.D.   On: 12/14/2019 14:37   DG Abd Portable 1V  Result Date: 12/15/2019 CLINICAL DATA:  Hypoxia, abdominal pain EXAM: PORTABLE ABDOMEN - 1 VIEW COMPARISON:  12/14/2019 FINDINGS: Supine frontal views of the abdomen and pelvis demonstrate common bile duct stent overlying right upper quadrant. Dilated gas-filled loops of small bowel in the left mid abdomen measure up to 3.3 cm in diameter. There is a relative paucity of distal bowel gas. No masses or abnormal calcifications. IMPRESSION: 1.  Common bile duct stent as above. 2. Nonspecific gaseous distention of the small bowel, which could reflect postprocedural ileus. Electronically Signed   By: Randa Ngo M.D.   On: 12/15/2019 15:08    Medications:  Scheduled: . Chlorhexidine Gluconate Cloth  6 each Topical Daily  . heparin injection (subcutaneous)   5,000 Units Subcutaneous Q8H  . insulin aspart  0-9 Units Subcutaneous Q4H  . insulin glargine  7 Units Subcutaneous Daily  . sodium chloride flush  10-40 mL Intracatheter Q12H   Continuous: .  prismasol BGK 4/2.5 500 mL/hr at 12/16/19 0423  .  prismasol BGK 4/2.5 500 mL/hr at 12/15/19 1734  . lactated ringers 50 mL/hr at 12/16/19 0700  . piperacillin-tazobactam Stopped (12/16/19 0344)  . prismasol BGK 4/2.5 2,000 mL/hr at 12/16/19 0427    Assessment/Plan: 1) Gallstone pancreatitis with SIRS. 2) ARF - Multifactorial. 3) Abnormal liver enzymes.   The patient is currently on CRRT.  Her abdomen is softer with palpation.  Surgery did not feel that she had an abdominal compartment syndrome.  There is now a decline in her liver enzymes.  The patient did benefit with the PCA, but PRN Dilaudid at 0.5 mg resulted in hallucinations this AM.  She did rate her pain at a 9/10 this AM and she remains tachycardic.  Her oxygen requirement is rather high and she is only saturating at 94%.  Her lungs were clear to auscultation this AM.  Plan: 1) Continue with the current aggressive supportive care. 2) Pain control per CCM.   LOS: 3 days   Brevan Luberto D 12/16/2019, 7:44 AM

## 2019-12-16 NOTE — Consult Note (Signed)
[facility] Progress Note    Assessment/ Plan:   1. AoCKD IIIa with aneuresis: Cr continued to climb over the course of the day with a max Cr 5.33. Continued to be aneuric. Concern for sepsis induced AKI (FENa 0.8% indicating pre-renal) +/- contrast induced nephropathy (s/p CTA on admission) with possible development of ATN. Bladder pressure at 40mmHg indicated increased intraabdominal pressure. Surgery consulted with plan to continue to monitor IAP. No abdominal decompression indicated. Renal ultrasound without hydronephrosis or obstruction, possible left renal lesions. Recommend follow up with CT. Patient transferred to ICU and had temporary dialysis catheter placed in the R IJ. She had CRRT overnight with improvement in her kidney function and electrolytes: Cr 5.3>3.10, BUN 31, Na 139, K 4.3, corrected Ca 9.4, Mag 2.3, Phos 3.4. 256mL UOP in last 24 hours. Will continue CRRT until improvement in kidney function and urine output. - continue CRRT  - foley catheter in place - monitor I/O's - stop mIVF while on CRRT. Goal to keep more volume up until renal function recovers and UOP improves, then will consider diuretics. Fluid net removal at 50-163ml/hr to help with respiratory status. - avoid nephrotoxic drugs  - meds dosed based on currently being on CRRT  2. Sepsis 2/2 Acute Gallstone Pancreatitis: CT abdomen/pelvis on 2/9 notable for acute pancreatitis likely secondary to an obstructive stone in the distal CBD near the ampulla with heterogenous areas of pancreatic necrosis. S/p ERCP with stent placement on 2/10. Patient continued to deteriorate over the course of the day yesterday becoming more hypoxic and developing multiorgan failure. She was transferred to ICU. Her lipase began to increase again after it was initially down trending. Lactic acidosis improving. AST/ALT, total bili level down trending. She received CCRT overnight. Remains tachycardic and tachypneic. Currently on  Zosyn. NGT placed with small improvement in abdominal distention. - Per CCM, GI, primary following - stop mIVF while on CRRT - NPO - PCA for pain  3. Acute Hypoxic Respiratory Failure  Sinus Tachycardia: likely secondary to sepsis, atelectasis and pleural effusion. CTA chest negative for PE. CXR notable for atelectasis and pleural effusion on the right. Transferred to ICU due to multiforgan failure. Currently satting 90% on 10L HFNC. - continue O2 - CRRT with net removal of 50-19ml/hr, discontinue IVF  4. UTI + Enterococcus: Currently on Zosyn.  - per primary   5. Thrombocytopenia: Platelets declined from 151 to 127 overnight. Currently on Heparin, could be secondary to her acute pancreatitis though. Will continue to monitor.    6. T2DM: per primary - SSI, lantus - hold metformin  7. HTN: Labile BP's overnight from 110's-160's/60's, Home meds: Amlodipine 10mg , Benzapril 20mg  QD, HCTZ 12.5mg  QD - hold home meds   8. HLD: hold statin   9. Dispo: pending workup  Subjective:   Awake and working on incentive spirometry this AM. Endorses difficulty with breathing.    Objective:   BP 117/68   Pulse (!) 110   Temp (!) 97.3 F (36.3 C) (Oral)   Resp (!) 22   Ht 5' 0.98" (1.549 m)   Wt 78.3 kg   SpO2 97%   BMI 32.63 kg/m   Intake/Output Summary (Last 24 hours) at 12/16/2019 0606 Last data filed at 12/16/2019 0500 Gross per 24 hour  Intake 2462.83 ml  Output 887 ml  Net 1575.83 ml   Weight change:   Physical Exam: General: ill appearing older female CV: tachycardic, regular rhythm, no lower extremity edema Lungs: difficult to assess, shallow breaths, increased  work of breathing on 10L HFNC   Abdomen: soft, distended, tender Skin: warm, dry Extremities: warm and well perfused  Imaging: DG Abd 1 View  Result Date: 12/14/2019 CLINICAL DATA:  74 year old with acute pancreatitis and acute onset of abdominal distention. EXAM: ABDOMEN - 1 VIEW COMPARISON:  CT abdomen and  pelvis yesterday. FINDINGS: Since the CT yesterday, development of borderline to mild gaseous distension of small bowel throughout the abdomen. Gas within normal caliber ascending colon. Remainder of the colon decompressed as on yesterday's CT. Residual contrast material in the renal parenchyma bilaterally and in the urinary bladder related to yesterday's CT, query acute kidney injury. No suggestion of free air on the supine image. Surgical clips in the RIGHT UPPER QUADRANT from prior cholecystectomy. IMPRESSION: 1. Mild generalized ileus. No evidence of bowel obstruction currently. 2. Residual contrast material in the renal parenchyma bilaterally and in the urinary bladder related to yesterday's CT, query acute kidney injury/renal insufficiency. Electronically Signed   By: Evangeline Dakin M.D.   On: 12/14/2019 09:37   US RENAL  Result Date: 12/15/2019 CLINICAL DATA:  Acute kidney injury. EXAM: RENAL / URINARY TRACT ULTRASOUND COMPLETE COMPARISON:  Abdominal CT 2 days prior 12/13/2019 FINDINGS: Right Kidney: Renal measurements: 11.7 x 5.6 x 5.4 cm = volume: 186 mL. Echogenicity within normal limits. No mass or hydronephrosis visualized. Tiny hypodensity in the lower right kidney is not well visualized sonographically. Left Kidney: Renal measurements: 12.7 x 5.0 x 4.8 cm = volume: 157 mL. Echogenicity within normal limits. No solid mass or hydronephrosis visualized. Cyst measuring 1.5 x 1.6 x 1.2 cm in the lower kidney. The additional low-density lesion on prior CT is not well visualized. Bladder: Decompressed by Foley catheter. Other: Hepatic steatosis incidentally noted. IMPRESSION: 1. No hydronephrosis or obstructive uropathy. 2. Small cyst in the lower left kidney. Additional indeterminate left renal lesions on CT are not well visualized sonographically. As recommended on prior CT, elective follow-up renal protocol MRI is recommended when patient is able and can tolerate breath hold technique. Electronically  Signed   By: Keith Rake M.D.   On: 12/15/2019 15:01   DG Chest Port 1 View  Result Date: 12/15/2019 CLINICAL DATA:  74 year old female central line placement. EXAM: PORTABLE CHEST 1 VIEW COMPARISON:  1436 hours today. FINDINGS: Portable AP semi upright view at 1607 hours. Right IJ approach central line placed, tip at the level of the lower SVC. No pneumothorax. Stable cardiac size and mediastinal contours. Stable low lung volumes. Mildly increased perihilar platelike opacity most resembling atelectasis. Otherwise stable ventilation. Enteric tube also placed and courses to the stomach with side hole at the level of the gastric body. There is a small volume of contrast in the gastric fundus. IMPRESSION: 1. Right IJ central line placed, tip at the lower SVC level. No adverse features. 2. Enteric tube placed, side hole at the level of the gastric body. 3. Continued low lung volumes with increased perihilar atelectasis from earlier today. Electronically Signed   By: Genevie Ann M.D.   On: 12/15/2019 16:22   DG CHEST PORT 1 VIEW  Result Date: 12/15/2019 CLINICAL DATA:  Hypoxia, abdominal pain EXAM: PORTABLE CHEST 1 VIEW COMPARISON:  12/14/2019 FINDINGS: Single frontal view of the chest demonstrates a stable cardiac silhouette. Continued low lung volumes. Increasing consolidation at the left lung base. No large effusion or pneumothorax. IMPRESSION: 1. Progressive left lower lobe consolidation favor atelectasis. Electronically Signed   By: Randa Ngo M.D.   On: 12/15/2019 15:07  DG Chest Port 1 View  Result Date: 12/14/2019 CLINICAL DATA:  75 year old with acute pancreatitis and shortness of breath. EXAM: PORTABLE CHEST 1 VIEW COMPARISON:  12/29/2018 and earlier. FINDINGS: Markedly suboptimal inspiration accounts for atelectasis in the lung bases, LEFT greater than RIGHT. Lungs otherwise clear. Pulmonary vascularity normal. Cardiac silhouette normal in size for AP portable technique and degree of  inspiration. IMPRESSION: Markedly suboptimal inspiration accounts for bibasilar atelectasis, LEFT greater than RIGHT. Electronically Signed   By: Evangeline Dakin M.D.   On: 12/14/2019 09:38   DG ERCP BILIARY & PANCREATIC DUCTS  Result Date: 12/14/2019 CLINICAL DATA:  74 year old female with choledocholithiasis EXAM: ERCP TECHNIQUE: Multiple spot images obtained with the fluoroscopic device and submitted for interpretation post-procedure. FLUOROSCOPY TIME:  Fluoroscopy Time:  1 minutes 34 seconds Radiation Exposure Index (if provided by the fluoroscopic device): 24.6 mGy Number of Acquired Spot Images: 0 COMPARISON:  CT scan of the abdomen and pelvis 12/13/2019 FINDINGS: Two intraoperative saved images are submitted for review. The images demonstrate a flexible endoscope in the descending duodenum with wire cannulation of the common bile duct. The second image demonstrates placement of a plastic biliary stent. IMPRESSION: ERCP with placement of plastic biliary stent. These images were submitted for radiologic interpretation only. Please see the procedural report for the amount of contrast and the fluoroscopy time utilized. Electronically Signed   By: Jacqulynn Cadet M.D.   On: 12/14/2019 14:37   DG Abd Portable 1V  Result Date: 12/15/2019 CLINICAL DATA:  Hypoxia, abdominal pain EXAM: PORTABLE ABDOMEN - 1 VIEW COMPARISON:  12/14/2019 FINDINGS: Supine frontal views of the abdomen and pelvis demonstrate common bile duct stent overlying right upper quadrant. Dilated gas-filled loops of small bowel in the left mid abdomen measure up to 3.3 cm in diameter. There is a relative paucity of distal bowel gas. No masses or abnormal calcifications. IMPRESSION: 1. Common bile duct stent as above. 2. Nonspecific gaseous distention of the small bowel, which could reflect postprocedural ileus. Electronically Signed   By: Randa Ngo M.D.   On: 12/15/2019 15:08    Labs: BMET Recent Labs  Lab 12/13/19 1400  12/13/19 2230 12/14/19 0215 12/15/19 0309 12/15/19 1500 12/16/19 0446  NA 140 141 139 138 138 139  K 3.6 4.8 5.8* 5.7* 5.4* 4.3  CL 101 103 102 103 103 101  CO2 22 18* 19* 21* 21* 22  GLUCOSE 219* 265* 267* 183* 189* 142*  BUN 26* 25* 27* 47* 58* 31*  CREATININE 1.09* 1.57* 1.83* 4.29* 5.33* 3.10*  CALCIUM 10.3 9.8 9.7 8.6* 8.3* 7.8*  PHOS  --   --  5.2*  --   --  3.4   CBC Recent Labs  Lab 12/13/19 1400 12/14/19 0215 12/15/19 0309 12/16/19 0446  WBC 26.4* 12.8* 17.9* 15.6*  HGB 16.2* 15.6* 12.0 10.9*  HCT 51.2* 50.6* 38.7 35.0*  MCV 84.6 86.6 88.4 89.3  PLT 356 194 151 127*    Medications:    . Chlorhexidine Gluconate Cloth  6 each Topical Daily  . heparin injection (subcutaneous)  5,000 Units Subcutaneous Q8H  . insulin aspart  0-9 Units Subcutaneous Q4H  . insulin glargine  7 Units Subcutaneous Daily  . sodium chloride flush  10-40 mL Intracatheter Q12H     Mina Marble, DO [facility] Resident, PGY2 12/16/2019, 6:06 AM

## 2019-12-17 ENCOUNTER — Inpatient Hospital Stay (HOSPITAL_COMMUNITY): Payer: Medicare Other

## 2019-12-17 DIAGNOSIS — E1136 Type 2 diabetes mellitus with diabetic cataract: Secondary | ICD-10-CM

## 2019-12-17 DIAGNOSIS — K851 Biliary acute pancreatitis without necrosis or infection: Secondary | ICD-10-CM

## 2019-12-17 DIAGNOSIS — R7989 Other specified abnormal findings of blood chemistry: Secondary | ICD-10-CM

## 2019-12-17 DIAGNOSIS — J9601 Acute respiratory failure with hypoxia: Secondary | ICD-10-CM

## 2019-12-17 LAB — COMPREHENSIVE METABOLIC PANEL
ALT: 106 U/L — ABNORMAL HIGH (ref 0–44)
AST: 52 U/L — ABNORMAL HIGH (ref 15–41)
Albumin: 2.5 g/dL — ABNORMAL LOW (ref 3.5–5.0)
Alkaline Phosphatase: 66 U/L (ref 38–126)
Anion gap: 16 — ABNORMAL HIGH (ref 5–15)
BUN: 24 mg/dL — ABNORMAL HIGH (ref 8–23)
CO2: 24 mmol/L (ref 22–32)
Calcium: 8 mg/dL — ABNORMAL LOW (ref 8.9–10.3)
Chloride: 99 mmol/L (ref 98–111)
Creatinine, Ser: 2.55 mg/dL — ABNORMAL HIGH (ref 0.44–1.00)
GFR calc Af Amer: 21 mL/min — ABNORMAL LOW (ref >60)
GFR calc non Af Amer: 18 mL/min — ABNORMAL LOW (ref >60)
Glucose, Bld: 177 mg/dL — ABNORMAL HIGH (ref 70–99)
Potassium: 4.3 mmol/L (ref 3.5–5.1)
Sodium: 139 mmol/L (ref 135–145)
Total Bilirubin: 1.9 mg/dL — ABNORMAL HIGH (ref 0.3–1.2)
Total Protein: 5.5 g/dL — ABNORMAL LOW (ref 6.5–8.1)

## 2019-12-17 LAB — RENAL FUNCTION PANEL
Albumin: 2.8 g/dL — ABNORMAL LOW (ref 3.5–5.0)
Anion gap: 20 — ABNORMAL HIGH (ref 5–15)
BUN: 27 mg/dL — ABNORMAL HIGH (ref 8–23)
CO2: 20 mmol/L — ABNORMAL LOW (ref 22–32)
Calcium: 8.3 mg/dL — ABNORMAL LOW (ref 8.9–10.3)
Chloride: 98 mmol/L (ref 98–111)
Creatinine, Ser: 2.72 mg/dL — ABNORMAL HIGH (ref 0.44–1.00)
GFR calc Af Amer: 19 mL/min — ABNORMAL LOW (ref >60)
GFR calc non Af Amer: 16 mL/min — ABNORMAL LOW (ref >60)
Glucose, Bld: 268 mg/dL — ABNORMAL HIGH (ref 70–99)
Phosphorus: 2.2 mg/dL — ABNORMAL LOW (ref 2.5–4.6)
Potassium: 4.2 mmol/L (ref 3.5–5.1)
Sodium: 138 mmol/L (ref 135–145)

## 2019-12-17 LAB — PHOSPHORUS: Phosphorus: 2.4 mg/dL — ABNORMAL LOW (ref 2.5–4.6)

## 2019-12-17 LAB — CBC
HCT: 27.7 % — ABNORMAL LOW (ref 36.0–46.0)
Hemoglobin: 8.8 g/dL — ABNORMAL LOW (ref 12.0–15.0)
MCH: 27.8 pg (ref 26.0–34.0)
MCHC: 31.8 g/dL (ref 30.0–36.0)
MCV: 87.4 fL (ref 80.0–100.0)
Platelets: 126 K/uL — ABNORMAL LOW (ref 150–400)
RBC: 3.17 MIL/uL — ABNORMAL LOW (ref 3.87–5.11)
RDW: 15 % (ref 11.5–15.5)
WBC: 14.9 K/uL — ABNORMAL HIGH (ref 4.0–10.5)
nRBC: 0.6 % — ABNORMAL HIGH (ref 0.0–0.2)

## 2019-12-17 LAB — GLUCOSE, CAPILLARY
Glucose-Capillary: 159 mg/dL — ABNORMAL HIGH (ref 70–99)
Glucose-Capillary: 169 mg/dL — ABNORMAL HIGH (ref 70–99)
Glucose-Capillary: 191 mg/dL — ABNORMAL HIGH (ref 70–99)
Glucose-Capillary: 209 mg/dL — ABNORMAL HIGH (ref 70–99)
Glucose-Capillary: 209 mg/dL — ABNORMAL HIGH (ref 70–99)
Glucose-Capillary: 262 mg/dL — ABNORMAL HIGH (ref 70–99)
Glucose-Capillary: 265 mg/dL — ABNORMAL HIGH (ref 70–99)

## 2019-12-17 LAB — MAGNESIUM: Magnesium: 2.5 mg/dL — ABNORMAL HIGH (ref 1.7–2.4)

## 2019-12-17 MED ORDER — INSULIN ASPART 100 UNIT/ML ~~LOC~~ SOLN
0.0000 [IU] | SUBCUTANEOUS | Status: DC
  Administered 2019-12-17: 5 [IU] via SUBCUTANEOUS
  Administered 2019-12-17: 23:00:00 2 [IU] via SUBCUTANEOUS
  Administered 2019-12-18 – 2019-12-19 (×8): 3 [IU] via SUBCUTANEOUS
  Administered 2019-12-19 (×2): 5 [IU] via SUBCUTANEOUS
  Administered 2019-12-19: 3 [IU] via SUBCUTANEOUS
  Administered 2019-12-19: 2 [IU] via SUBCUTANEOUS
  Administered 2019-12-20: 5 [IU] via SUBCUTANEOUS
  Administered 2019-12-20 (×2): 3 [IU] via SUBCUTANEOUS
  Administered 2019-12-20: 04:00:00 2 [IU] via SUBCUTANEOUS
  Administered 2019-12-20: 17:00:00 5 [IU] via SUBCUTANEOUS
  Administered 2019-12-21 (×2): 2 [IU] via SUBCUTANEOUS
  Administered 2019-12-21: 23:00:00 1 [IU] via SUBCUTANEOUS
  Administered 2019-12-21 – 2019-12-22 (×5): 2 [IU] via SUBCUTANEOUS
  Administered 2019-12-22: 1 [IU] via SUBCUTANEOUS

## 2019-12-17 MED ORDER — PRISMASOL BGK 4/2.5 32-4-2.5 MEQ/L REPLACEMENT SOLN
Status: DC
  Filled 2019-12-17 (×2): qty 5000

## 2019-12-17 MED ORDER — HALOPERIDOL LACTATE 5 MG/ML IJ SOLN
1.0000 mg | INTRAMUSCULAR | Status: DC | PRN
  Administered 2019-12-17: 1 mg via INTRAVENOUS
  Filled 2019-12-17: qty 1

## 2019-12-17 MED ORDER — PRISMASOL BGK 4/2.5 32-4-2.5 MEQ/L IV SOLN
INTRAVENOUS | Status: DC
  Administered 2019-12-18 (×2): 2000 mL/h via INTRAVENOUS_CENTRAL
  Filled 2019-12-17 (×7): qty 5000

## 2019-12-17 MED ORDER — SODIUM CHLORIDE 0.9 % IV SOLN
INTRAVENOUS | Status: DC
  Administered 2019-12-17: 10 mL/h via INTRAVENOUS

## 2019-12-17 MED ORDER — INSULIN ASPART 100 UNIT/ML ~~LOC~~ SOLN
4.0000 [IU] | SUBCUTANEOUS | Status: DC
  Administered 2019-12-17 – 2019-12-21 (×14): 4 [IU] via SUBCUTANEOUS

## 2019-12-17 MED ORDER — INSULIN ASPART 100 UNIT/ML ~~LOC~~ SOLN: 4.0000 [IU] | SUBCUTANEOUS | Status: DC

## 2019-12-17 NOTE — Progress Notes (Signed)
Dr Jonnie Finner notified of 1600 labs, 25 ml urine output (since 1345).  Orders received to restart CRRT.  Will continue to closely monitor.

## 2019-12-17 NOTE — Plan of Care (Signed)
CRRT restarted at 1852.

## 2019-12-17 NOTE — Progress Notes (Signed)
[facility] Progress Note    Assessment/ Plan:   1. AKI on CKD IIIa, anuric: Currently on CRRT since 2/11, Cr trending down, 1.09>>5.33>> 2.55 this am. Suspect multifactorial, ATN with additional inflammatory/hemodynamically mediated injury and contrast nephropathy.  Will continue with CRRT, still net up 532ml since admit, may potentially transition tomorrow, 2/14, as volume status normalizes.    2. SIRS  Acute Gallstone Pancreatitis  Ileus: Improving. S/p ERCP with stent placement on 2/10. NGT tube in place with 1.8L outpt. Plan per GI and CCM.   3. Acute Hypoxic Respiratory Failure: Felt due to atelectasis. On NRB. Per CCM.   4. Anemia: Downtrend since admit Hgb 16.2> 8.8 today. Suspect multifactorial, hemodilution, GI procedure, and critical illness. Poor prognostic factor due to concurrent pancreatitis.  5. UTI + Enterococcus: On Zosyn per primary.   6. Thrombocytopenia: 126 this am, stable. Monitor.   7. T2DM  Hyperlipidemia: SSI per primary  8. Hypertension: SBP average 130's. Cont to hold home anti-hypertensives.   Subjective:   No acute events overnight.  Says she is feeling much better this morning, excited to be eating ice chips.  Denies any difficulty breathing, abdominal pain.   Objective:   BP (!) 139/56   Pulse 90   Temp (!) 97.2 F (36.2 C) (Axillary)   Resp 20   Ht 5' 0.98" (1.549 m)   Wt 78.3 kg   SpO2 100%   BMI 32.63 kg/m   Intake/Output Summary (Last 24 hours) at 12/17/2019 0801 Last data filed at 12/17/2019 0700 Gross per 24 hour  Intake 513.79 ml  Output 3188 ml  Net -2674.21 ml   Weight change: -2 kg  Physical Exam: General: Ill-appearing older female in no acute distress, eating ice chips  Cardiac: Tachycardic with regular rhythm Lungs: Clear anteriorly with unlabored breathing, on NRB 10 L Abdomen: Soft, nontender, hypoactive bowel sounds, NG tube in place with approximately 500 cc serosanguineous/fecal appearing  drainage Extremities: Warm, dry, no appreciable pitting edema   Imaging: US RENAL  Result Date: 12/15/2019 CLINICAL DATA:  Acute kidney injury. EXAM: RENAL / URINARY TRACT ULTRASOUND COMPLETE COMPARISON:  Abdominal CT 2 days prior 12/13/2019 FINDINGS: Right Kidney: Renal measurements: 11.7 x 5.6 x 5.4 cm = volume: 186 mL. Echogenicity within normal limits. No mass or hydronephrosis visualized. Tiny hypodensity in the lower right kidney is not well visualized sonographically. Left Kidney: Renal measurements: 12.7 x 5.0 x 4.8 cm = volume: 157 mL. Echogenicity within normal limits. No solid mass or hydronephrosis visualized. Cyst measuring 1.5 x 1.6 x 1.2 cm in the lower kidney. The additional low-density lesion on prior CT is not well visualized. Bladder: Decompressed by Foley catheter. Other: Hepatic steatosis incidentally noted. IMPRESSION: 1. No hydronephrosis or obstructive uropathy. 2. Small cyst in the lower left kidney. Additional indeterminate left renal lesions on CT are not well visualized sonographically. As recommended on prior CT, elective follow-up renal protocol MRI is recommended when patient is able and can tolerate breath hold technique. Electronically Signed   By: Keith Rake M.D.   On: 12/15/2019 15:01   DG CHEST PORT 1 VIEW  Result Date: 12/17/2019 CLINICAL DATA:  Acute respiratory failure. EXAM: PORTABLE CHEST 1 VIEW COMPARISON:  12/16/2019 FINDINGS: Right IJ catheter tip is at the cavoatrial junction. Feeding tube and NG tube are identified. The tips are both well below the level of the GE junction. Heart size is normal. Unchanged bilateral pleural effusions. Mild bibasilar atelectasis. IMPRESSION: Stable bilateral pleural effusions with bibasilar atelectasis.  Electronically Signed   By: Kerby Moors M.D.   On: 12/17/2019 07:42   DG Chest Port 1 View  Result Date: 12/16/2019 CLINICAL DATA:  Complication of central venous catheter EXAM: PORTABLE CHEST 1 VIEW COMPARISON:   12/15/2019, 12/16/2019 FINDINGS: Single frontal view of the chest demonstrates stable position of the right internal jugular central venous catheter overlying superior vena cava. There are 2 enteric catheters extending below the diaphragm, tips excluded by collimation. Cardiac silhouette is stable. Bibasilar veiling opacities, right greater than left, consistent with consolidation and effusion. No pneumothorax. IMPRESSION: 1. Support devices as above. 2. Stable bibasilar veiling opacities consistent with consolidation and effusions. Electronically Signed   By: Randa Ngo M.D.   On: 12/16/2019 20:33   DG CHEST PORT 1 VIEW  Result Date: 12/16/2019 CLINICAL DATA:  Acute respiratory failure EXAM: PORTABLE CHEST 1 VIEW COMPARISON:  12/14/2018 FINDINGS: Gastric catheter and right jugular dialysis catheter are noted and stable. Cardiac shadow is within normal limits. Small bilateral pleural effusions are seen with bibasilar atelectasis. These have increased slightly in the interval from the prior exam IMPRESSION: Slight increase in pleural effusion particularly on the right. Bibasilar atelectatic changes are noted. Electronically Signed   By: Inez Catalina M.D.   On: 12/16/2019 09:23   DG Chest Port 1 View  Result Date: 12/15/2019 CLINICAL DATA:  74 year old female central line placement. EXAM: PORTABLE CHEST 1 VIEW COMPARISON:  1436 hours today. FINDINGS: Portable AP semi upright view at 1607 hours. Right IJ approach central line placed, tip at the level of the lower SVC. No pneumothorax. Stable cardiac size and mediastinal contours. Stable low lung volumes. Mildly increased perihilar platelike opacity most resembling atelectasis. Otherwise stable ventilation. Enteric tube also placed and courses to the stomach with side hole at the level of the gastric body. There is a small volume of contrast in the gastric fundus. IMPRESSION: 1. Right IJ central line placed, tip at the lower SVC level. No adverse features.  2. Enteric tube placed, side hole at the level of the gastric body. 3. Continued low lung volumes with increased perihilar atelectasis from earlier today. Electronically Signed   By: Genevie Ann M.D.   On: 12/15/2019 16:22   DG CHEST PORT 1 VIEW  Result Date: 12/15/2019 CLINICAL DATA:  Hypoxia, abdominal pain EXAM: PORTABLE CHEST 1 VIEW COMPARISON:  12/14/2019 FINDINGS: Single frontal view of the chest demonstrates a stable cardiac silhouette. Continued low lung volumes. Increasing consolidation at the left lung base. No large effusion or pneumothorax. IMPRESSION: 1. Progressive left lower lobe consolidation favor atelectasis. Electronically Signed   By: Randa Ngo M.D.   On: 12/15/2019 15:07   DG Abd Portable 1V  Result Date: 12/16/2019 CLINICAL DATA:  Feeding tube placement EXAM: PORTABLE ABDOMEN - 1 VIEW COMPARISON:  None. FINDINGS: An NG tube terminates in the stomach. The feeding tube terminates near the ligament of Treitz in the left side of the abdomen. IMPRESSION: The feeding tube terminates near the ligament of Treitz in the left side of the abdomen. The NG tube is in good position. Electronically Signed   By: Dorise Bullion III M.D   On: 12/16/2019 15:22   DG Abd Portable 1V  Result Date: 12/15/2019 CLINICAL DATA:  Hypoxia, abdominal pain EXAM: PORTABLE ABDOMEN - 1 VIEW COMPARISON:  12/14/2019 FINDINGS: Supine frontal views of the abdomen and pelvis demonstrate common bile duct stent overlying right upper quadrant. Dilated gas-filled loops of small bowel in the left mid abdomen measure up to  3.3 cm in diameter. There is a relative paucity of distal bowel gas. No masses or abnormal calcifications. IMPRESSION: 1. Common bile duct stent as above. 2. Nonspecific gaseous distention of the small bowel, which could reflect postprocedural ileus. Electronically Signed   By: Randa Ngo M.D.   On: 12/15/2019 15:08    Labs: BMET Recent Labs  Lab 12/13/19 2230 12/13/19 2230 12/14/19 0215  12/15/19 0309 12/15/19 1500 12/16/19 0446 12/16/19 0954 12/16/19 1655 12/17/19 0335  NA 141   < > 139 138 138 139 136 139 139  K 4.8   < > 5.8* 5.7* 5.4* 4.3 4.5 4.3 4.3  CL 103  --  102 103 103 101  --  101 99  CO2 18*  --  19* 21* 21* 22  --  20* 24  GLUCOSE 265*  --  267* 183* 189* 142*  --  169* 177*  BUN 25*  --  27* 47* 58* 31*  --  26* 24*  CREATININE 1.57*  --  1.83* 4.29* 5.33* 3.10*  --  2.89* 2.55*  CALCIUM 9.8  --  9.7 8.6* 8.3* 7.8*  --  7.3* 8.0*  PHOS  --   --  5.2*  --   --  3.4  --  3.1 2.4*   < > = values in this interval not displayed.   CBC Recent Labs  Lab 12/14/19 0215 12/14/19 0215 12/15/19 0309 12/16/19 0446 12/16/19 0954 12/17/19 0335  WBC 12.8*  --  17.9* 15.6*  --  14.9*  HGB 15.6*   < > 12.0 10.9* 10.5* 8.8*  HCT 50.6*   < > 38.7 35.0* 31.0* 27.7*  MCV 86.6  --  88.4 89.3  --  87.4  PLT 194  --  151 127*  --  126*   < > = values in this interval not displayed.    Medications:    . B-complex with vitamin C  1 tablet Per Tube Daily  . Chlorhexidine Gluconate Cloth  6 each Topical Daily  . heparin injection (subcutaneous)  5,000 Units Subcutaneous Q8H  . insulin aspart  0-9 Units Subcutaneous Q4H  . insulin glargine  7 Units Subcutaneous Daily  . sodium chloride flush  10-40 mL Intracatheter Q12H     Darrelyn Hillock, DO  [facility] Family Medicine Resident, PGY2 12/17/2019, 8:01 AM

## 2019-12-17 NOTE — Progress Notes (Signed)
NAME:  Katherine Oliver, MRN:  [id number], DOB:  05-07-1946, LOS: 4 ADMISSION DATE:  12/13/2019, CONSULTATION DATE:  2/11 REFERRING MD:  Posey Pronto, CHIEF COMPLAINT:  Dyspnea   Brief History   74 y/o female admitted on 2/9 with gallstone pancreatitis and UTI.  Had ERCP on 2/10 with stent placement, PCCM consulted 2/11 with worsening pancreatic pain, increased tachycardia, increased work of breathing and supplemental oxygen use, worsening AKI.  Past Medical History  CKD stage III, allergic rhinitis, type 2 diabetes, GERD, hepatic steatosis, polycythemia  Significant Hospital Events   Admitted to the hospitalist service on 2/9, IV fluids initiated, Zosyn started for leukocytosis, and outpatient Enterococcus urinary tract infection.  Tachycardic on telemetry. 2/10: Underwent ERCP by Dr. Benson Norway: Noted be technically difficult and complex, successful with biliary sphincterotomy performed and plastic stent placed into the common bile duct she returned to the stepdown unit 2/11: Having worse pain but apparently initially improved in the early morning, no function remarkably worse increased from 1.57 on admission to 4.29.  She was started on PCA for pancreatitis related pain, nephrology consulted for worsening acute on chronic renal failure, surgery consulted for increased intrabladder pressure and concern for possible abdominal compartment syndrome, and critical care consulted for concern about overall clinical decline 2/12 afib rvr, cor trak placed, started tube feeding  Consults:  GI ->Hung consulted 2/9 General surg consulted 2/11 for concern about abd compartment  Renal consulted for Acute on chronic renal failure, hyperkalemia, lactic acidosis  Procedures:  ERCP 2/10: completed successfully w/ stent placement   Significant Diagnostic Tests:  2/9 CT angio: 1. Acute pancreatitis likely secondary to an obstructive stone in the distal common bile duct near the ampulla. 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 No evidence for pseudocyst formations at time. Mild intrahepatic and extrahepatic biliary dilatation. Subtle low-density along the posterior right hepatic lobe may be related to inflammation but this area is indeterminate. 2. Indeterminate left renal lesions. Unusual lesion or hypodensities in the posterior left kidney lower pole could represent areas of infarct, focal pyelonephritis or atypical neoplastic lesion. There is also a new indeterminate 1 cm lesion in the medial left kidney. Recommend follow-up renal MRI when patient is stable and can not tolerate an abdominal MRI. 2/11 CT abd/pelvis 1. Acute pancreatitis likely secondary to an obstructive stone in the distal common bile duct near the ampulla. 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 No evidence for pseudocyst formations at time. Mild intrahepatic and extrahepatic biliary dilatation. Subtle low-density along the posterior right hepatic lobe may be related to inflammation but this area is indeterminate. 2. Indeterminate left renal lesions. Unusual lesion or hypodensities in the posterior left kidney lower pole could represent areas of infarct, focal pyelonephritis or atypical neoplastic lesion. There is also a new indeterminate 1 cm lesion in the medial left kidney  Micro Data:  UC  2/9 enterococcus faecalis  UC 2/11>>> BCX2 2/11>>>  Antimicrobials:  Zosyn 2/9 >> 2/13  Interim history/subjective:  ON NRB mask Tolerating tube feeding Tolerating ice chips Minimal abdominal pain lft's improving Atrial fib with rvr on 2/12  Objective   Blood pressure (!) 139/56, pulse 90, temperature 97.6 F (36.4 C), temperature source Oral, resp. rate 20, height 5' 0.98" (1.549 m), weight 78.3 kg, SpO2 100 %. CVP:  [0 mmHg-12 mmHg] 3 mmHg      Intake/Output Summary (Last 24 hours) at 12/17/2019 0742 Last data filed at 12/17/2019 0700 Gross per 24  hour  Intake 563.86 ml  Output 3387 ml  Net -2823.14 ml   Filed Weights   12/13/19 2100 12/16/19 0600 12/17/19 0400  Weight: 78.3 kg 80.3 kg 78.3 kg    Examination: General:  Resting comfortably in bed HENT: NCAT OP clear, R IJ HD cath, NRB mask in place, NG/cor trak in place PULM: CTA B, normal effort CV: RRR, no mgr GI: BS+, soft, nontender MSK: normal bulk and tone Neuro: awake, alert, no distress, MAEW   2/13 CXR images personally reviewed> R pleural effusion, atelectasis R base, R IJ HD cath in place  Resolved Hospital Problem list   ileus  Assessment & Plan:  Gallstone pancreatitis post ERCP Pain under good control Acute hepatitis > improving Fentanyl prn pain Continue tube feeding Ice chips today Monitor LFT  Acute hypoxemic and hypercarib respiratory failure > due to atelectasis Out of bed Incentive spirometry Wean off O2  Atrial fibrillation with RVR temporarily  Tele Stop dilt drip  Acute on chronic renal failure Continue CRRT Monitor urine output  Thrombocytopenia Monitor for bleeding  Enterococcus UTI Stop zosyn  DM2 with hyperglycemia SSI  Acute metabolic encephalopathy> improved Frequent orientation  Avoid sedating meds Haldol prn seere agitation  Nutrition needs Continue tube feeding  Best practice:  Diet: tube feeding Pain/Anxiety/Delirium protocol (if indicated): n/a VAP protocol (if indicated): n/a DVT prophylaxis: sub cutaneous heparin GI prophylaxis: famotidine Glucose control: SSI Mobility: bed rest Code Status: full code Family Communication: I updated her husband [name] by phone on 2/13 Disposition: remain in ICU  Labs   CBC: Recent Labs  Lab 12/13/19 1400 12/13/19 1400 12/14/19 0215 12/15/19 0309 12/16/19 0446 12/16/19 0954 12/17/19 0335  WBC 26.4*  --  12.8* 17.9* 15.6*  --  14.9*  HGB 16.2*   < > 15.6* 12.0 10.9* 10.5* 8.8*  HCT 51.2*   < > 50.6* 38.7 35.0* 31.0* 27.7*  MCV 84.6  --  86.6 88.4 89.3   --  87.4  PLT 356  --  194 151 127*  --  126*   < > = values in this interval not displayed.    Basic Metabolic Panel: Recent Labs  Lab 12/14/19 0215 12/14/19 0215 12/15/19 0309 12/15/19 0309 12/15/19 1500 12/16/19 0446 12/16/19 0954 12/16/19 1655 12/17/19 0335  NA 139   < > 138   < > 138 139 136 139 139  K 5.8*   < > 5.7*   < > 5.4* 4.3 4.5 4.3 4.3  CL 102   < > 103  --  103 101  --  101 99  CO2 19*   < > 21*  --  21* 22  --  20* 24  GLUCOSE 267*   < > 183*  --  189* 142*  --  169* 177*  BUN 27*   < > 47*  --  58* 31*  --  26* 24*  CREATININE 1.83*   < > 4.29*  --  5.33* 3.10*  --  2.89* 2.55*  CALCIUM 9.7   < > 8.6*  --  8.3* 7.8*  --  7.3* 8.0*  MG 1.7  --  1.5*  --   --  2.3  --   --  2.5*  PHOS 5.2*  --   --   --   --  3.4  --  3.1 2.4*   < > = values in this interval not displayed.   GFR: Estimated Creatinine Clearance: 18.1 mL/min (A) (by C-G formula based on  SCr of 2.55 mg/dL (H)). Recent Labs  Lab 12/13/19 1400 12/13/19 2230 12/14/19 0215 12/15/19 0309 12/15/19 1011 12/16/19 0446 12/16/19 0900 12/17/19 0335  PROCALCITON  --  3.47  --   --   --   --   --   --   WBC   < >  --  12.8* 17.9*  --  15.6*  --  14.9*  LATICACIDVEN  --  6.2* 4.9*  --  2.2*  --  1.6  --    < > = values in this interval not displayed.    Liver Function Tests: Recent Labs  Lab 12/13/19 2230 12/13/19 2230 12/14/19 0215 12/15/19 0309 12/16/19 0446 12/16/19 1655 12/17/19 0335  AST 542*  --  474* 421* 104*  --  52*  ALT 390*  --  402* 268* 144*  --  106*  ALKPHOS 78  --  75 63 71  --  66  BILITOT 2.0*  --  1.7* 1.6* 1.5*  --  1.9*  PROT 7.1  --  7.0 6.1* 5.4*  --  5.5*  ALBUMIN 4.6   < > 4.5 3.4* 2.8* 2.5* 2.5*   < > = values in this interval not displayed.   Recent Labs  Lab 12/13/19 1400 12/14/19 0215 12/15/19 0309 12/16/19 0900  LIPASE 5,205* 1,048* 2,210* 173*   Recent Labs  Lab 12/16/19 0900  AMMONIA 47*    ABG    Component Value Date/Time   PHART 7.320  (L) 12/16/2019 0954   PCO2ART 40.1 12/16/2019 0954   PO2ART 63.0 (L) 12/16/2019 0954   HCO3 20.7 12/16/2019 0954   TCO2 22 12/16/2019 0954   ACIDBASEDEF 5.0 (H) 12/16/2019 0954   O2SAT 90.0 12/16/2019 0954     Coagulation Profile: No results for input(s): INR, PROTIME in the last 168 hours.  Cardiac Enzymes: Recent Labs  Lab 12/14/19 0215  CKTOTAL 38    HbA1C: Hgb A1c MFr Bld  Date/Time Value Ref Range Status  12/14/2019 02:15 AM 6.4 (H) 4.8 - 5.6 % Final    Comment:    (NOTE)         Prediabetes: 5.7 - 6.4         Diabetes: >6.4         Glycemic control for adults with diabetes: <7.0   08/23/2019 09:19 AM 6.3 4.6 - 6.5 % Final    Comment:    Glycemic Control Guidelines for People with Diabetes:Non Diabetic:  <6%Goal of Therapy: <7%Additional Action Suggested:  >8%     CBG: Recent Labs  Lab 12/16/19 1121 12/16/19 1526 12/16/19 2009 12/16/19 2357 12/17/19 0453  GLUCAP 146* 149* 170* 159* 169*     Critical care time: 35 minutes    Roselie Awkward, MD [place] [facility] Pager: [phone] Cell: [phone] If no response, call [phone]

## 2019-12-17 NOTE — Progress Notes (Signed)
Notified GI MD at bedside of increase abdominal distension.  Order received to increase TF 10 ML (total of 40 ml/hr) and not to further increase until rounds in am.  Will continue to monitor.

## 2019-12-17 NOTE — Progress Notes (Signed)
[place] Progress Note Patient Name: Katherine Oliver DOB: 12/21/1945 MRN: [id number]   Date of Service  12/17/2019  HPI/Events of Note  Hyperglycemia - Blood glucose = 262.  Currently on Lantus + Q 4 hour sensitive Novolog SSI.  eICU Interventions  Will add Q 4 hour 4 units Novolog insulin for tube feeding coverage.      Intervention Category Major Interventions: Hyperglycemia - active titration of insulin therapy  Lysle Dingwall 12/17/2019, 8:18 PM

## 2019-12-17 NOTE — Progress Notes (Signed)
[place] Progress Note Patient Name: Katherine Oliver DOB: Jan 02, 1946 MRN: [id number]   Date of Service  12/17/2019  HPI/Events of Note  Agitation - QTc interval = 0.39 seconds.   eICU Interventions  Will order: 1. Haldol 1 mg IV Q 4 hours PRN agitation.  2. Monitor QTc interval Q 4 hours. Notify MD if QTc interval > 500 milliseconds.      Intervention Category Major Interventions: Delirium, psychosis, severe agitation - evaluation and management  Sommer,Steven Eugene 12/17/2019, 12:23 AM

## 2019-12-17 NOTE — Progress Notes (Addendum)
[facility] Progress Note Weekend coverage for Dr. Benson Norway Chief Complaint: Gallstone pancreatitis  History:  Signout received yesterday on this patient from Dr. Benson Norway.  Severe gallstone pancreatitis, ERCP 2 days ago, no obvious stones in the bile duct, technically challenging procedure, plastic biliary stent placed.  Patient has had complex course with acute renal failure, on CRRT.  Core track tube was placed for enteral nutrition.  NG tube draining coffee-ground and dark bilious liquid. Says abdominal pain is "not bad" today. Wishes she could have more than just sips of water and ice chips. Having diarrhea  Nursing was concerned that patient was somewhat more distended today.  (I had not previously seen the patient) core track tube feeding was increased from 20 mL an hour to 30 mL an hour earlier today.  ROS: Cardiovascular: No chest pain Respiratory: Breathing fairly good, but abdomen hurts to take a deep breath Urinary: No dysuria  Objective:   Current Facility-Administered Medications:  .  acetaminophen (TYLENOL) tablet 650 mg, 650 mg, Oral, Q6H PRN **OR** acetaminophen (TYLENOL) suppository 650 mg, 650 mg, Rectal, Q6H PRN, Doutova, Anastassia, MD .  B-complex with vitamin C tablet 1 tablet, 1 tablet, Per Tube, Daily, Chesley Mires, MD, 1 tablet at 12/17/19 [phone] .  Chlorhexidine Gluconate Cloth 2 % PADS 6 each, 6 each, Topical, Daily, Lucky Cowboy, MD, 6 each at 12/16/19 2027 .  diltiazem (CARDIZEM) 125 mg in dextrose 5% 125 mL (1 mg/mL) infusion, 5-15 mg/hr, Intravenous, Titrated, Chesley Mires, MD, Stopped at 12/17/19 0908 .  feeding supplement (VITAL AF 1.2 CAL) liquid 1,000 mL, 1,000 mL, Per Tube, Continuous, [name], MD, Last Rate: 20 mL/hr at 12/17/19 0300, Rate Verify at 12/17/19 0300 .  fentaNYL (SUBLIMAZE) injection 25 mcg, 25 mcg, Intravenous, Q2H PRN, Magdalen Spatz, NP, 25 mcg at 12/17/19 1457 .  haloperidol lactate (HALDOL) injection 1 mg, 1 mg, Intravenous, Q4H PRN,  Anders Simmonds, MD, 1 mg at 12/17/19 0040 .  heparin injection 1,000-6,000 Units, 1,000-6,000 Units, CRRT, PRN, Edrick Oh, MD, 3,000 Units at 12/17/19 1406 .  heparin injection 5,000 Units, 5,000 Units, Subcutaneous, Q8H, Erick Colace, NP, 5,000 Units at 12/17/19 1646 .  hydrALAZINE (APRESOLINE) injection 10 mg, 10 mg, Intravenous, Q4H PRN, Amin, Ankit Chirag, MD .  insulin aspart (novoLOG) injection 0-9 Units, 0-9 Units, Subcutaneous, Q4H, Doutova, Anastassia, MD, 5 Units at 12/17/19 1646 .  insulin glargine (LANTUS) injection 7 Units, 7 Units, Subcutaneous, Daily, Amin, Jeanella Flattery, MD, 7 Units at 12/17/19 [phone] .  ipratropium (ATROVENT) nebulizer solution 0.5 mg, 0.5 mg, Nebulization, Q6H PRN, Amin, Ankit Chirag, MD, 0.5 mg at 12/15/19 1659 .  labetalol (NORMODYNE) injection 10 mg, 10 mg, Intravenous, Q2H PRN, Doutova, Anastassia, MD, 10 mg at 12/16/19 1209 .  levalbuterol (XOPENEX) nebulizer solution 1.25 mg, 1.25 mg, Nebulization, Q6H PRN, Amin, Ankit Chirag, MD, 1.25 mg at 12/15/19 1659 .  metoCLOPramide (REGLAN) injection 5 mg, 5 mg, Intravenous, Q6H PRN, Doutova, Anastassia, MD .  naloxone (NARCAN) injection 0.4 mg, 0.4 mg, Intravenous, PRN, Doutova, Anastassia, MD .  ondansetron (ZOFRAN) tablet 4 mg, 4 mg, Oral, Q6H PRN **OR** ondansetron (ZOFRAN) injection 4 mg, 4 mg, Intravenous, Q6H PRN, Doutova, Anastassia, MD, 4 mg at 12/13/19 2150 .  sodium chloride flush (NS) 0.9 % injection 10-40 mL, 10-40 mL, Intracatheter, Q12H, Agarwala, Ravi, MD, 10 mL at 12/16/19 1215 .  sodium chloride flush (NS) 0.9 % injection 10-40 mL, 10-40 mL, Intracatheter, PRN, Kipp Brood, MD, 20 mL at 12/17/19 0757  .  diltiazem (CARDIZEM) infusion Stopped (12/17/19 0908)  . feeding supplement (VITAL AF 1.2 CAL) 20 mL/hr at 12/17/19 0300     Vital signs in last 24 hrs: Vitals:   12/17/19 1830 12/17/19 1835  BP: (!) 145/65   Pulse: (!) 123 (!) 120  Resp:  (!) 36  Temp:    SpO2: (!) 77% (!) 86%     Intake/Output Summary (Last 24 hours) at 12/17/2019 1851 Last data filed at 12/17/2019 1500 Gross per 24 hour  Intake 892.6 ml  Output 3955 ml  Net -3062.4 ml     Physical Exam Acutely ill-appearing, nontoxic.  Alert, conversational, somewhat restricted affect, mildly tachypneic on nasal cannula oxygen.  HEENT: sclera anicteric, oral mucosa without lesions  Neck: supple, no thyromegaly, JVD or lymphadenopathy  Cardiac: RRR without murmurs, S1S2 heard, + peripheral edema  Pulm: clear to auscultation bilaterally, normal RR and effort noted  Abdomen: soft, bandlike upper tenderness, with active but hollow bowel sounds.  Skin; warm and dry, no jaundice, + pale Recent Labs:  CBC Latest Ref Rng & Units 12/17/2019 12/16/2019 12/16/2019  WBC 4.0 - 10.5 K/uL 14.9(H) - 15.6(H)  Hemoglobin 12.0 - 15.0 g/dL 8.8(L) 10.5(L) 10.9(L)  Hematocrit 36.0 - 46.0 % 27.7(L) 31.0(L) 35.0(L)  Platelets 150 - 400 K/uL 126(L) - 127(L)    No results for input(s): INR in the last 168 hours. CMP Latest Ref Rng & Units 12/17/2019 12/17/2019 12/16/2019  Glucose 70 - 99 mg/dL 268(H) 177(H) 169(H)  BUN 8 - 23 mg/dL 27(H) 24(H) 26(H)  Creatinine 0.44 - 1.00 mg/dL 2.72(H) 2.55(H) 2.89(H)  Sodium 135 - 145 mmol/L 138 139 139  Potassium 3.5 - 5.1 mmol/L 4.2 4.3 4.3  Chloride 98 - 111 mmol/L 98 99 101  CO2 22 - 32 mmol/L 20(L) 24 20(L)  Calcium 8.9 - 10.3 mg/dL 8.3(L) 8.0(L) 7.3(L)  Total Protein 6.5 - 8.1 g/dL - 5.5(L) -  Total Bilirubin 0.3 - 1.2 mg/dL - 1.9(H) -  Alkaline Phos 38 - 126 U/L - 66 -  AST 15 - 41 U/L - 52(H) -  ALT 0 - 44 U/L - 106(H) -    @ASSESSMENTPLANBEGIN @ Assessment:  Biliary pancreatitis, status post ERCP with plastic biliary stent placement 12/15/2019 Complex course, respiratory difficulty, acute renal failure on CRRT. Upper abdominal pain from pancreatitis Elevated LFTs, stable.  Her abdomen is mildly distended, not tympanic, has active bowel sounds, passing loose stool.   Motility decreased by severity of acute illness.  Does not appear to be frank ileus at this point.   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  Nursing asked me about tube feed rate.  I recommended increase it as planned to 40 mL an hour, see how patient tolerates it, keep it at that rate, and can be reevaluated tomorrow.  I will see tomorrow if called/needed for acute changes or complex questions.  Otherwise, Dr. Benson Norway will return on Monday.  Nelida Meuse III Office: [phone]

## 2019-12-17 NOTE — Progress Notes (Signed)
[facility] Progress Note Patient Name: Katherine Oliver DOB: 15-Jan-1946 MRN: [id number]   Date of Service  12/17/2019  HPI/Events of Note  AFIB with RVR - Ventricular rate = 173. Cardizem IV infusion restarted. Last K+ = 4.2.   eICU Interventions  Will order: 1. Mg++ level STAT.     Intervention Category Major Interventions: Arrhythmia - evaluation and management  Ianna Salmela Eugene 12/17/2019, 11:01 PM

## 2019-12-17 NOTE — Plan of Care (Signed)
CRRT terminated at 1347 due to overrange TMP numbers beyond user prescribed settings. No blood returned to the patient due to potential for clots present in the filter. Pt and filter set clamped off accordingly. Pt HD access flushed and heparin locked per protocol. Previous order given by MD to not continue with CRRT should filter problems occur. Will notify Nephrology. Will continue to monitor and assess.

## 2019-12-18 LAB — RENAL FUNCTION PANEL
Albumin: 2.6 g/dL — ABNORMAL LOW (ref 3.5–5.0)
Anion gap: 11 (ref 5–15)
BUN: 25 mg/dL — ABNORMAL HIGH (ref 8–23)
CO2: 24 mmol/L (ref 22–32)
Calcium: 7.9 mg/dL — ABNORMAL LOW (ref 8.9–10.3)
Chloride: 101 mmol/L (ref 98–111)
Creatinine, Ser: 2.07 mg/dL — ABNORMAL HIGH (ref 0.44–1.00)
GFR calc Af Amer: 26 mL/min — ABNORMAL LOW (ref >60)
GFR calc non Af Amer: 23 mL/min — ABNORMAL LOW (ref >60)
Glucose, Bld: 221 mg/dL — ABNORMAL HIGH (ref 70–99)
Phosphorus: 2.3 mg/dL — ABNORMAL LOW (ref 2.5–4.6)
Potassium: 3.9 mmol/L (ref 3.5–5.1)
Sodium: 136 mmol/L (ref 135–145)

## 2019-12-18 LAB — CBC WITH DIFFERENTIAL/PLATELET
Abs Immature Granulocytes: 1.93 10*3/uL — ABNORMAL HIGH (ref 0.00–0.07)
Basophils Absolute: 0.1 10*3/uL (ref 0.0–0.1)
Basophils Relative: 1 %
Eosinophils Absolute: 0 10*3/uL (ref 0.0–0.5)
Eosinophils Relative: 0 %
HCT: 25.3 % — ABNORMAL LOW (ref 36.0–46.0)
Hemoglobin: 8 g/dL — ABNORMAL LOW (ref 12.0–15.0)
Immature Granulocytes: 9 %
Lymphocytes Relative: 7 %
Lymphs Abs: 1.4 10*3/uL (ref 0.7–4.0)
MCH: 27.8 pg (ref 26.0–34.0)
MCHC: 31.6 g/dL (ref 30.0–36.0)
MCV: 87.8 fL (ref 80.0–100.0)
Monocytes Absolute: 2.3 10*3/uL — ABNORMAL HIGH (ref 0.1–1.0)
Monocytes Relative: 11 %
Neutro Abs: 14.8 10*3/uL — ABNORMAL HIGH (ref 1.7–7.7)
Neutrophils Relative %: 72 %
Platelets: 165 10*3/uL (ref 150–400)
RBC: 2.88 MIL/uL — ABNORMAL LOW (ref 3.87–5.11)
RDW: 15.4 % (ref 11.5–15.5)
WBC: 20.5 10*3/uL — ABNORMAL HIGH (ref 4.0–10.5)
nRBC: 1.7 % — ABNORMAL HIGH (ref 0.0–0.2)

## 2019-12-18 LAB — CBC
HCT: 21 % — ABNORMAL LOW (ref 36.0–46.0)
Hemoglobin: 6.5 g/dL — CL (ref 12.0–15.0)
MCH: 27.3 pg (ref 26.0–34.0)
MCHC: 31 g/dL (ref 30.0–36.0)
MCV: 88.2 fL (ref 80.0–100.0)
Platelets: 225 10*3/uL (ref 150–400)
RBC: 2.38 MIL/uL — ABNORMAL LOW (ref 3.87–5.11)
RDW: 16.4 % — ABNORMAL HIGH (ref 11.5–15.5)
WBC: 39 10*3/uL — ABNORMAL HIGH (ref 4.0–10.5)
nRBC: 3.8 % — ABNORMAL HIGH (ref 0.0–0.2)

## 2019-12-18 LAB — CULTURE, BLOOD (ROUTINE X 2)
Culture: NO GROWTH
Culture: NO GROWTH
Culture: NO GROWTH
Culture: NO GROWTH
Special Requests: ADEQUATE
Special Requests: ADEQUATE

## 2019-12-18 LAB — GLUCOSE, CAPILLARY
Glucose-Capillary: 209 mg/dL — ABNORMAL HIGH (ref 70–99)
Glucose-Capillary: 246 mg/dL — ABNORMAL HIGH (ref 70–99)
Glucose-Capillary: 211 mg/dL — ABNORMAL HIGH (ref 70–99)
Glucose-Capillary: 203 mg/dL — ABNORMAL HIGH (ref 70–99)
Glucose-Capillary: 219 mg/dL — ABNORMAL HIGH (ref 70–99)

## 2019-12-18 LAB — COMPREHENSIVE METABOLIC PANEL
ALT: 80 U/L — ABNORMAL HIGH (ref 0–44)
AST: 38 U/L (ref 15–41)
Albumin: 2.4 g/dL — ABNORMAL LOW (ref 3.5–5.0)
Alkaline Phosphatase: 140 U/L — ABNORMAL HIGH (ref 38–126)
Anion gap: 13 (ref 5–15)
BUN: 27 mg/dL — ABNORMAL HIGH (ref 8–23)
CO2: 24 mmol/L (ref 22–32)
Calcium: 8.2 mg/dL — ABNORMAL LOW (ref 8.9–10.3)
Chloride: 101 mmol/L (ref 98–111)
Creatinine, Ser: 2.45 mg/dL — ABNORMAL HIGH (ref 0.44–1.00)
GFR calc Af Amer: 22 mL/min — ABNORMAL LOW (ref >60)
GFR calc non Af Amer: 19 mL/min — ABNORMAL LOW (ref >60)
Glucose, Bld: 248 mg/dL — ABNORMAL HIGH (ref 70–99)
Potassium: 3.6 mmol/L (ref 3.5–5.1)
Sodium: 138 mmol/L (ref 135–145)
Total Bilirubin: 1 mg/dL (ref 0.3–1.2)
Total Protein: 5.2 g/dL — ABNORMAL LOW (ref 6.5–8.1)

## 2019-12-18 LAB — MAGNESIUM
Magnesium: 2.8 mg/dL — ABNORMAL HIGH (ref 1.7–2.4)
Magnesium: 2.6 mg/dL — ABNORMAL HIGH (ref 1.7–2.4)

## 2019-12-18 LAB — PHOSPHORUS: Phosphorus: 2.2 mg/dL — ABNORMAL LOW (ref 2.5–4.6)

## 2019-12-18 MED ORDER — ALBUMIN HUMAN 5 % IV SOLN
INTRAVENOUS | Status: AC
  Filled 2019-12-18: qty 250

## 2019-12-18 MED ORDER — PANTOPRAZOLE SODIUM 40 MG IV SOLR
40.0000 mg | Freq: Two times a day (BID) | INTRAVENOUS | Status: DC
  Administered 2019-12-19 (×3): 40 mg via INTRAVENOUS
  Filled 2019-12-18 (×3): qty 40

## 2019-12-18 MED ORDER — AMIODARONE HCL IN DEXTROSE 360-4.14 MG/200ML-% IV SOLN
60.0000 mg/h | INTRAVENOUS | Status: AC
  Administered 2019-12-18 (×2): 60 mg/h via INTRAVENOUS
  Filled 2019-12-18 (×2): qty 200

## 2019-12-18 MED ORDER — VITAL AF 1.2 CAL PO LIQD
1000.0000 mL | ORAL | Status: DC
  Administered 2019-12-19: 1000 mL

## 2019-12-18 MED ORDER — AMIODARONE LOAD VIA INFUSION
150.0000 mg | Freq: Once | INTRAVENOUS | Status: AC
  Administered 2019-12-18: 150 mg via INTRAVENOUS
  Filled 2019-12-18: qty 83.34

## 2019-12-18 MED ORDER — SODIUM CHLORIDE 0.9% IV SOLUTION: Freq: Once | INTRAVENOUS | Status: AC

## 2019-12-18 MED ORDER — AMIODARONE HCL IN DEXTROSE 360-4.14 MG/200ML-% IV SOLN
30.0000 mg/h | INTRAVENOUS | Status: DC
  Administered 2019-12-18 – 2019-12-19 (×3): 30 mg/h via INTRAVENOUS
  Filled 2019-12-18 (×3): qty 200

## 2019-12-18 MED ORDER — ALBUMIN HUMAN 25 % IV SOLN
25.0000 g | Freq: Once | INTRAVENOUS | Status: AC
  Administered 2019-12-18: 25 g via INTRAVENOUS
  Filled 2019-12-18: qty 100
  Filled 2019-12-18: qty 50

## 2019-12-18 NOTE — Progress Notes (Signed)
[facility] Progress Note Patient Name: Katherine Oliver DOB: 05-26-1946 MRN: [id number]   Date of Service  12/18/2019  HPI/Events of Note  Hgb level was 6.5. No overt bleeding, though the RN notes possible coffee ground output from OG tube which may point to an upper GI source.   eICU Interventions  T&S ordered. 1u pRBC transfusion ordered with post-transfusion H/H check.  Katherine Oliver (ppx) discontinued for now.  Will order Protonix 40mg  IV BID.  Continue to monitor for evidence of melana or coffee-ground gastric output that might indicate source of bleeding.  Albumin bolus for low MAP while awaiting blood.     Intervention Category Intermediate Interventions: Bleeding - evaluation and treatment with blood products  Katherine Oliver 12/18/2019, 10:35 PM

## 2019-12-18 NOTE — Progress Notes (Signed)
Patient ID: Katherine Oliver, female   DOB: 1946/07/27, 74 y.o.   MRN: [id number]  [facility] Progress Note   Assessment/ Plan:   1. Acute kidney Injury: This appears to be consistent with ATN from sepsis/acute pancreatitis as well as contrast nephropathy.  Anuric overnight without clear indications of renal recovery and I will continue her on CRRT possibly for the next 24 hours with the goal to reassess CVP/volume status/labs/urine output prior to considering discontinuation and transition to IHD if still indicated.  Her hemodynamic instability may be a barrier to discontinuation of CRRT. 2.  Acute gallstone pancreatitis: Status post ERCP with biliary stent placement.  Without evidence of intra-abdominal compartment syndrome and on enteral feeds.  Rising leukocytosis without fever-possibly demargination but may need repeat abdominal imaging. 3.  Atrial fibrillation with rapid ventricular response: Diltiazem infusion discontinued due to hypotension and patient started on amiodarone drip.  Currently appears rate controlled and in sinus rhythm. 4.  Anemia: Appears to be consistent with anemia of critical illness versus associated with pancreatitis.  We will continue to follow for overt loss and need for PRBCs. 5.  Leukocytosis: Suspect demargination from severe pancreatitis however, may need repeat imaging of the abdomen.  Culture negative.  Subjective:   Hemodynamic instability with A. fib with RVR noted overnight.  Problems with CRRT noted, not tolerating this with ultrafiltration of 100 cc/h.   Objective:   BP (!) 81/36   Pulse 91   Temp 98.6 F (37 C) (Oral)   Resp (!) 25   Ht 5' 0.98" (1.549 m)   Wt 80.3 kg   SpO2 98%   BMI 33.47 kg/m   Intake/Output Summary (Last 24 hours) at 12/18/2019 0815 Last data filed at 12/18/2019 0700 Gross per 24 hour  Intake 1151.63 ml  Output 2504 ml  Net -1352.37 ml   Weight change: 2 kg  Physical Exam: Gen: Appears  comfortable resting in bed, NGT/core track in situ CVS: Pulse regular rhythm, normal rate, S1 and S2 normal Resp: Anteriorly clear to auscultation, no distinct rales or rhonchi Abd: Soft, moderately distended, tender upper quadrants, bowel sounds normal Ext: No lower extremity edema  Imaging: DG CHEST PORT 1 VIEW  Result Date: 12/17/2019 CLINICAL DATA:  Acute respiratory failure. EXAM: PORTABLE CHEST 1 VIEW COMPARISON:  12/16/2019 FINDINGS: Right IJ catheter tip is at the cavoatrial junction. Feeding tube and NG tube are identified. The tips are both well below the level of the GE junction. Heart size is normal. Unchanged bilateral pleural effusions. Mild bibasilar atelectasis. IMPRESSION: Stable bilateral pleural effusions with bibasilar atelectasis. Electronically Signed   By: Kerby Moors M.D.   On: 12/17/2019 07:42   DG Chest Port 1 View  Result Date: 12/16/2019 CLINICAL DATA:  Complication of central venous catheter EXAM: PORTABLE CHEST 1 VIEW COMPARISON:  12/15/2019, 12/16/2019 FINDINGS: Single frontal view of the chest demonstrates stable position of the right internal jugular central venous catheter overlying superior vena cava. There are 2 enteric catheters extending below the diaphragm, tips excluded by collimation. Cardiac silhouette is stable. Bibasilar veiling opacities, right greater than left, consistent with consolidation and effusion. No pneumothorax. IMPRESSION: 1. Support devices as above. 2. Stable bibasilar veiling opacities consistent with consolidation and effusions. Electronically Signed   By: Randa Ngo M.D.   On: 12/16/2019 20:33   DG CHEST PORT 1 VIEW  Result Date: 12/16/2019 CLINICAL DATA:  Acute respiratory failure EXAM: PORTABLE CHEST 1 VIEW COMPARISON:  12/14/2018 FINDINGS: Gastric catheter and right jugular  dialysis catheter are noted and stable. Cardiac shadow is within normal limits. Small bilateral pleural effusions are seen with bibasilar atelectasis. These  have increased slightly in the interval from the prior exam IMPRESSION: Slight increase in pleural effusion particularly on the right. Bibasilar atelectatic changes are noted. Electronically Signed   By: Inez Catalina M.D.   On: 12/16/2019 09:23   DG Abd Portable 1V  Result Date: 12/16/2019 CLINICAL DATA:  Feeding tube placement EXAM: PORTABLE ABDOMEN - 1 VIEW COMPARISON:  None. FINDINGS: An NG tube terminates in the stomach. The feeding tube terminates near the ligament of Treitz in the left side of the abdomen. IMPRESSION: The feeding tube terminates near the ligament of Treitz in the left side of the abdomen. The NG tube is in good position. Electronically Signed   By: Dorise Bullion III M.D   On: 12/16/2019 15:22    Labs: BMET Recent Labs  Lab 12/14/19 0215 12/14/19 0215 12/15/19 0309 12/15/19 0309 12/15/19 1500 12/16/19 0446 12/16/19 0954 12/16/19 1655 12/17/19 0335 12/17/19 1630 12/18/19 0454  NA 139   < > 138   < > 138 139 136 139 139 138 138  K 5.8*   < > 5.7*   < > 5.4* 4.3 4.5 4.3 4.3 4.2 3.6  CL 102   < > 103  --  103 101  --  101 99 98 101  CO2 19*   < > 21*  --  21* 22  --  20* 24 20* 24  GLUCOSE 267*   < > 183*  --  189* 142*  --  169* 177* 268* 248*  BUN 27*   < > 47*  --  58* 31*  --  26* 24* 27* 27*  CREATININE 1.83*   < > 4.29*  --  5.33* 3.10*  --  2.89* 2.55* 2.72* 2.45*  CALCIUM 9.7   < > 8.6*  --  8.3* 7.8*  --  7.3* 8.0* 8.3* 8.2*  PHOS 5.2*  --   --   --   --  3.4  --  3.1 2.4* 2.2* 2.2*   < > = values in this interval not displayed.   CBC Recent Labs  Lab 12/15/19 0309 12/15/19 0309 12/16/19 0446 12/16/19 0954 12/17/19 0335 12/18/19 0454  WBC 17.9*  --  15.6*  --  14.9* 20.5*  NEUTROABS  --   --   --   --   --  14.8*  HGB 12.0   < > 10.9* 10.5* 8.8* 8.0*  HCT 38.7   < > 35.0* 31.0* 27.7* 25.3*  MCV 88.4  --  89.3  --  87.4 87.8  PLT 151  --  127*  --  126* 165   < > = values in this interval not displayed.   Medications:    . B-complex with  vitamin C  1 tablet Per Tube Daily  . Chlorhexidine Gluconate Cloth  6 each Topical Daily  . heparin injection (subcutaneous)  5,000 Units Subcutaneous Q8H  . insulin aspart  0-9 Units Subcutaneous Q4H  . insulin aspart  4 Units Subcutaneous Q4H  . insulin glargine  7 Units Subcutaneous Daily  . sodium chloride flush  10-40 mL Intracatheter Q12H   Elmarie Shiley, MD 12/18/2019, 8:15 AM

## 2019-12-18 NOTE — Progress Notes (Signed)
[facility] Progress Note Patient Name: Katherine Oliver DOB: 09-Feb-1946 MRN: [id number]   Date of Service  12/18/2019  HPI/Events of Note  AFIB with RVR - Ventricular rate = 130's on Cardizem IV infusion. BP = 82/46. Mg++ = 2.8.   eICU Interventions  Will order: 1. D/C Cardizem IV infusion.  2. Amiodarone IV load and infusion.     Intervention Category Major Interventions: Arrhythmia - evaluation and management;Hypotension - evaluation and management  Lysle Dingwall 12/18/2019, 12:36 AM

## 2019-12-18 NOTE — Progress Notes (Signed)
CRITICAL VALUE ALERT  Critical Value: Hgb 6.5  Date & Time Notied: 12/18/19 2021  Provider Notified: Warren Lacy Notified   Orders Received/Actions taken: New orders to draw type and screen, obtain consent for blood transfusion, and 1 unit PRBC's.   Pt Hypotensive with  MAP's in the 76's- MD made aware- New order to give Albumin while waiting for type and screen and PRBC's.   RN will continue to monitor pt closely.

## 2019-12-18 NOTE — Progress Notes (Signed)
[facility] Progress Note Patient Name: Tareva Leske DOB: [birth year] MRN: [id number]   Date of Service  12/18/2019  HPI/Events of Note  RN requests CBC check. Patient pale. Hgb has been downtrending. S/p ERCP.   eICU Interventions  Ordered CBC.     Intervention Category Minor Interventions: Other:  Charlott Rakes 12/18/2019, 9:37 PM

## 2019-12-18 NOTE — Progress Notes (Signed)
NAME:  Katherine Oliver, MRN:  [id number], DOB:  [birth year], LOS: 5 ADMISSION DATE:  12/13/2019, CONSULTATION DATE:  2/11 REFERRING MD:  Posey Pronto, CHIEF COMPLAINT:  Dyspnea   Brief History   74 y/o female admitted on 2/9 with gallstone pancreatitis and UTI.  Had ERCP on 2/10 with stent placement, PCCM consulted 2/11 with worsening pancreatic pain, increased tachycardia, increased work of breathing and supplemental oxygen use, worsening AKI.  Past Medical History  CKD stage III, allergic rhinitis, type 2 diabetes, GERD, hepatic steatosis, polycythemia  Significant Hospital Events   Admitted to the hospitalist service on 2/9, IV fluids initiated, Zosyn started for leukocytosis, and outpatient Enterococcus urinary tract infection.  Tachycardic on telemetry. 2/10: Underwent ERCP by Dr. Benson Norway: Noted be technically difficult and complex, successful with biliary sphincterotomy performed and plastic stent placed into the common bile duct she returned to the stepdown unit 2/11: Having worse pain but apparently initially improved in the early morning, no function remarkably worse increased from 1.57 on admission to 4.29.  She was started on PCA for pancreatitis related pain, nephrology consulted for worsening acute on chronic renal failure, surgery consulted for increased intrabladder pressure and concern for possible abdominal compartment syndrome, and critical care consulted for concern about overall clinical decline 2/12 afib rvr, cor trak placed, started tube feeding 2/13 PM afib with RVR, hypotensive, dilt held, started amiodarone due to hypotension  Consults:  GI ->[place] consulted 2/9 General surg consulted 2/11 for concern about abd compartment  Renal consulted for Acute on chronic renal failure, hyperkalemia, lactic acidosis  Procedures:  ERCP 2/10: completed successfully w/ stent placement   Significant Diagnostic Tests:  2/9 CT angio: 1. Acute pancreatitis likely secondary to an  obstructive stone in the distal common bile duct near the ampulla. 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 No evidence for pseudocyst formations at time. Mild intrahepatic and extrahepatic biliary dilatation. Subtle low-density along the posterior right hepatic lobe may be related to inflammation but this area is indeterminate. 2. Indeterminate left renal lesions. Unusual lesion or hypodensities in the posterior left kidney lower pole could represent areas of infarct, focal pyelonephritis or atypical neoplastic lesion. There is also a new indeterminate 1 cm lesion in the medial left kidney. Recommend follow-up renal MRI when patient is stable and can not tolerate an abdominal MRI. 2/11 CT abd/pelvis 1. Acute pancreatitis likely secondary to an obstructive stone in the distal common bile duct near the ampulla. 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 No evidence for pseudocyst formations at time. Mild intrahepatic and extrahepatic biliary dilatation. Subtle low-density along the posterior right hepatic lobe may be related to inflammation but this area is indeterminate. 2. Indeterminate left renal lesions. Unusual lesion or hypodensities in the posterior left kidney lower pole could represent areas of infarct, focal pyelonephritis or atypical neoplastic lesion. There is also a new indeterminate 1 cm lesion in the medial left kidney  Micro Data:  UC  2/9 enterococcus faecalis  UC 2/11>>> BCX2 2/11>>>  Antimicrobials:  Zosyn 2/9 >> 2/13  Interim history/subjective:  afib with RVR, hypotensive, dilt held, started amiodarone due to hypotension  Objective   Blood pressure (!) 128/57, pulse 100, temperature 98.6 F (37 C), temperature source Oral, resp. rate (!) 27, height 5' 0.98" (1.549 m), weight 80.3 kg, SpO2 100 %. CVP:  [1 mmHg-61 mmHg] 9 mmHg      Intake/Output Summary (Last 24 hours) at 12/18/2019 3329 Last data filed at  12/18/2019 0700 Gross per 24 hour  Intake 1086.7 ml  Output 2313 ml  Net -1226.3 ml   Filed Weights   12/16/19 0600 12/17/19 0400 12/18/19 0600  Weight: 80.3 kg 78.3 kg 80.3 kg    Examination:  General:  Resting comfortably in bed HENT: NCAT NG/Cortrak in place, HD cath in place PULM: CTA B, normal effort CV: RRR, no mgr GI: BS+, soft, nontender MSK: normal bulk and tone Neuro: awake, alert, no distress, MAEW   2/13 CXR images personally reviewed> R pleural effusion, atelectasis R base, R IJ HD cath in place  Resolved Hospital Problem list   ileus  Assessment & Plan:  Gallstone pancreatitis post ERCP Pain under good control Acute hepatitis > improving Continue fentanyl prn Continue tube feeding> can she eat by mouth? Ice chips Repeat LFT in AM  Acute hypoxemic and hypercarib respiratory failure > due to atelectasis, improving (on 3L [place] 2/14 AM) Wean off O2 for O2 sat > 88% Out of bed Incentive spirometry  Atrial fibrillation with RVR temporarily  Tele Amiodarone  Leukocytosis on 2/14, no fever, HD stable otherwise Monitor off antibiotics  Acute on chronic renal failure Continue CRRT Monitor UOP  Thrombocytopenia Monitor for bleeding  Enterococcus UTI Monitor off antibiotics  DM2 with hyperglycemia SSI  Acute metabolic encephalopathy> improved Frequent orientation Avoid sedating meds Haldol prn  Nutrition needs Continue tube feeding  Physical deconditioning Out of bed as tolerated  Best practice:  Diet: tube feeding Pain/Anxiety/Delirium protocol (if indicated): n/a VAP protocol (if indicated): n/a DVT prophylaxis: sub cutaneous heparin GI prophylaxis: famotidine Glucose control: SSI Mobility: bed rest Code Status: full code Family Communication: I updated her husband [name] by phone on 2/14 Disposition: remain in ICU  Labs   CBC: Recent Labs  Lab 12/14/19 0215 12/14/19 0215 12/15/19 0309 12/16/19 0446 12/16/19 0954  12/17/19 0335 12/18/19 0454  WBC 12.8*  --  17.9* 15.6*  --  14.9* 20.5*  NEUTROABS  --   --   --   --   --   --  14.8*  HGB 15.6*   < > 12.0 10.9* 10.5* 8.8* 8.0*  HCT 50.6*   < > 38.7 35.0* 31.0* 27.7* 25.3*  MCV 86.6  --  88.4 89.3  --  87.4 87.8  PLT 194  --  151 127*  --  126* 165   < > = values in this interval not displayed.    Basic Metabolic Panel: Recent Labs  Lab 12/15/19 0309 12/15/19 1500 12/16/19 0446 12/16/19 0446 12/16/19 0954 12/16/19 1655 12/17/19 0335 12/17/19 1630 12/17/19 2308 12/18/19 0454  NA 138   < > 139   < > 136 139 139 138  --  138  K 5.7*   < > 4.3   < > 4.5 4.3 4.3 4.2  --  3.6  CL 103   < > 101  --   --  101 99 98  --  101  CO2 21*   < > 22  --   --  20* 24 20*  --  24  GLUCOSE 183*   < > 142*  --   --  169* 177* 268*  --  248*  BUN 47*   < > 31*  --   --  26* 24* 27*  --  27*  CREATININE 4.29*   < > 3.10*  --   --  2.89* 2.55* 2.72*  --  2.45*  CALCIUM 8.6*   < > 7.8*  --   --  7.3* 8.0* 8.3*  --  8.2*  MG 1.5*  --  2.3  --   --   --  2.5*  --  2.8* 2.6*  PHOS  --   --  3.4  --   --  3.1 2.4* 2.2*  --  2.2*   < > = values in this interval not displayed.   GFR: Estimated Creatinine Clearance: 19 mL/min (A) (by C-G formula based on SCr of 2.45 mg/dL (H)). Recent Labs  Lab 12/13/19 1400 12/13/19 2230 12/14/19 0215 12/14/19 0215 12/15/19 0309 12/15/19 1011 12/16/19 0446 12/16/19 0900 12/17/19 0335 12/18/19 0454  PROCALCITON  --  3.47  --   --   --   --   --   --   --   --   WBC   < >  --  12.8*   < > 17.9*  --  15.6*  --  14.9* 20.5*  LATICACIDVEN  --  6.2* 4.9*  --   --  2.2*  --  1.6  --   --    < > = values in this interval not displayed.    Liver Function Tests: Recent Labs  Lab 12/14/19 0215 12/14/19 0215 12/15/19 0309 12/15/19 0309 12/16/19 0446 12/16/19 1655 12/17/19 0335 12/17/19 1630 12/18/19 0454  AST 474*  --  421*  --  104*  --  52*  --  38  ALT 402*  --  268*  --  144*  --  106*  --  80*  ALKPHOS 75  --   63  --  71  --  66  --  140*  BILITOT 1.7*  --  1.6*  --  1.5*  --  1.9*  --  1.0  PROT 7.0  --  6.1*  --  5.4*  --  5.5*  --  5.2*  ALBUMIN 4.5   < > 3.4*   < > 2.8* 2.5* 2.5* 2.8* 2.4*   < > = values in this interval not displayed.   Recent Labs  Lab 12/13/19 1400 12/14/19 0215 12/15/19 0309 12/16/19 0900  LIPASE 5,205* 1,048* 2,210* 173*   Recent Labs  Lab 12/16/19 0900  AMMONIA 47*    ABG    Component Value Date/Time   PHART 7.320 (L) 12/16/2019 0954   PCO2ART 40.1 12/16/2019 0954   PO2ART 63.0 (L) 12/16/2019 0954   HCO3 20.7 12/16/2019 0954   TCO2 22 12/16/2019 0954   ACIDBASEDEF 5.0 (H) 12/16/2019 0954   O2SAT 90.0 12/16/2019 0954     Coagulation Profile: No results for input(s): INR, PROTIME in the last 168 hours.  Cardiac Enzymes: Recent Labs  Lab 12/14/19 0215  CKTOTAL 38    HbA1C: Hgb A1c MFr Bld  Date/Time Value Ref Range Status  12/14/2019 02:15 AM 6.4 (H) 4.8 - 5.6 % Final    Comment:    (NOTE)         Prediabetes: 5.7 - 6.4         Diabetes: >6.4         Glycemic control for adults with diabetes: <7.0   08/23/2019 09:19 AM 6.3 4.6 - 6.5 % Final    Comment:    Glycemic Control Guidelines for People with Diabetes:Non Diabetic:  <6%Goal of Therapy: <7%Additional Action Suggested:  >8%     CBG: Recent Labs  Lab 12/17/19 1641 12/17/19 1921 12/17/19 2314 12/18/19 0311 12/18/19 0746  GLUCAP 265* 262* 191* 209* 246*     Critical care time: 32  minutes    Roselie Awkward, MD [facility] Pager: [phone] Cell: [phone] If no response, call [phone]

## 2019-12-19 ENCOUNTER — Encounter (HOSPITAL_COMMUNITY)
Admission: AD | Disposition: A | Discharge status: Home Health | Payer: Self-pay | Source: Other Acute Inpatient Hospital | Attending: Internal Medicine

## 2019-12-19 HISTORY — PX: ESOPHAGOGASTRODUODENOSCOPY: SHX5428

## 2019-12-19 LAB — HEMOGLOBIN AND HEMATOCRIT, BLOOD
HCT: 27.9 % — ABNORMAL LOW (ref 36.0–46.0)
Hemoglobin: 9.2 g/dL — ABNORMAL LOW (ref 12.0–15.0)

## 2019-12-19 LAB — RENAL FUNCTION PANEL
Albumin: 2.5 g/dL — ABNORMAL LOW (ref 3.5–5.0)
Albumin: 2.5 g/dL — ABNORMAL LOW (ref 3.5–5.0)
Anion gap: 13 (ref 5–15)
Anion gap: 13 (ref 5–15)
BUN: 33 mg/dL — ABNORMAL HIGH (ref 8–23)
BUN: 50 mg/dL — ABNORMAL HIGH (ref 8–23)
CO2: 22 mmol/L (ref 22–32)
CO2: 23 mmol/L (ref 22–32)
Calcium: 7.7 mg/dL — ABNORMAL LOW (ref 8.9–10.3)
Calcium: 7.7 mg/dL — ABNORMAL LOW (ref 8.9–10.3)
Chloride: 100 mmol/L (ref 98–111)
Chloride: 100 mmol/L (ref 98–111)
Creatinine, Ser: 2.4 mg/dL — ABNORMAL HIGH (ref 0.44–1.00)
Creatinine, Ser: 3.72 mg/dL — ABNORMAL HIGH (ref 0.44–1.00)
GFR calc Af Amer: 22 mL/min — ABNORMAL LOW (ref >60)
GFR calc Af Amer: 13 mL/min — ABNORMAL LOW (ref >60)
GFR calc non Af Amer: 19 mL/min — ABNORMAL LOW (ref >60)
GFR calc non Af Amer: 11 mL/min — ABNORMAL LOW (ref >60)
Glucose, Bld: 285 mg/dL — ABNORMAL HIGH (ref 70–99)
Glucose, Bld: 210 mg/dL — ABNORMAL HIGH (ref 70–99)
Phosphorus: 2.7 mg/dL (ref 2.5–4.6)
Phosphorus: 2.9 mg/dL (ref 2.5–4.6)
Potassium: 4.2 mmol/L (ref 3.5–5.1)
Potassium: 3.7 mmol/L (ref 3.5–5.1)
Sodium: 135 mmol/L (ref 135–145)
Sodium: 136 mmol/L (ref 135–145)

## 2019-12-19 LAB — ABO/RH: ABO/RH(D): B POS

## 2019-12-19 LAB — CBC
HCT: 23.3 % — ABNORMAL LOW (ref 36.0–46.0)
Hemoglobin: 7.5 g/dL — ABNORMAL LOW (ref 12.0–15.0)
MCH: 28.2 pg (ref 26.0–34.0)
MCHC: 32.2 g/dL (ref 30.0–36.0)
MCV: 87.6 fL (ref 80.0–100.0)
Platelets: 145 10*3/uL — ABNORMAL LOW (ref 150–400)
RBC: 2.66 MIL/uL — ABNORMAL LOW (ref 3.87–5.11)
RDW: 15.3 % (ref 11.5–15.5)
WBC: 32.4 10*3/uL — ABNORMAL HIGH (ref 4.0–10.5)
nRBC: 3.8 % — ABNORMAL HIGH (ref 0.0–0.2)

## 2019-12-19 LAB — PREPARE RBC (CROSSMATCH)

## 2019-12-19 LAB — GLUCOSE, CAPILLARY
Glucose-Capillary: 188 mg/dL — ABNORMAL HIGH (ref 70–99)
Glucose-Capillary: 206 mg/dL — ABNORMAL HIGH (ref 70–99)
Glucose-Capillary: 225 mg/dL — ABNORMAL HIGH (ref 70–99)
Glucose-Capillary: 232 mg/dL — ABNORMAL HIGH (ref 70–99)
Glucose-Capillary: 250 mg/dL — ABNORMAL HIGH (ref 70–99)
Glucose-Capillary: 274 mg/dL — ABNORMAL HIGH (ref 70–99)
Glucose-Capillary: 274 mg/dL — ABNORMAL HIGH (ref 70–99)

## 2019-12-19 LAB — MAGNESIUM: Magnesium: 2.4 mg/dL (ref 1.7–2.4)

## 2019-12-19 LAB — OCCULT BLOOD X 1 CARD TO LAB, STOOL: Fecal Occult Bld: POSITIVE — AB

## 2019-12-19 SURGERY — EGD (ESOPHAGOGASTRODUODENOSCOPY)
Anesthesia: Moderate Sedation

## 2019-12-19 MED ORDER — MIDAZOLAM HCL (PF) 5 MG/ML IJ SOLN
INTRAMUSCULAR | Status: AC
  Filled 2019-12-19: qty 2

## 2019-12-19 MED ORDER — SODIUM CHLORIDE 0.9 % IV SOLN: INTRAVENOUS | Status: DC

## 2019-12-19 MED ORDER — FENTANYL CITRATE (PF) 100 MCG/2ML IJ SOLN
INTRAMUSCULAR | Status: AC
  Filled 2019-12-19: qty 2

## 2019-12-19 MED ORDER — SODIUM CHLORIDE 0.9% IV SOLUTION: Freq: Once | INTRAVENOUS | Status: AC

## 2019-12-19 MED ORDER — SUCRALFATE 1 GM/10ML PO SUSP
1.0000 g | Freq: Four times a day (QID) | ORAL | Status: DC
  Administered 2019-12-19 – 2019-12-22 (×11): 1 g
  Filled 2019-12-19 (×11): qty 10

## 2019-12-19 MED ORDER — ALBUMIN HUMAN 5 % IV SOLN
INTRAVENOUS | Status: AC
  Filled 2019-12-19: qty 500

## 2019-12-19 MED ORDER — FENTANYL CITRATE (PF) 100 MCG/2ML IJ SOLN
INTRAMUSCULAR | Status: DC | PRN
  Administered 2019-12-19 (×2): 25 ug via INTRAVENOUS

## 2019-12-19 MED ORDER — MIDAZOLAM HCL (PF) 10 MG/2ML IJ SOLN
INTRAMUSCULAR | Status: DC | PRN
  Administered 2019-12-19 (×2): 1 mg via INTRAVENOUS
  Administered 2019-12-19: .5 mg via INTRAVENOUS
  Administered 2019-12-19 (×2): 1 mg via INTRAVENOUS

## 2019-12-19 NOTE — Progress Notes (Addendum)
Patient ID: Katherine Oliver, female   DOB: 11-21-1945, 74 y.o.   MRN: [id number]  [facility] Progress Note   Assessment/ Plan:   1. Acute kidney Injury: Appears to be consistent with ATN from sepsis/acute pancreatitis + contrast nephropath. Worsening Cr overnight suspect secondary to anemia from possible GI bleed. Cr 2.07>2.40 today.  Continued to remain anuric overnight. Electrolytes stable this AM. Blood pressure normotensive this am - no longer on pressors. Will discontinue CRRT today. Plan to start IHD possible tomorrow. Monitor kidney function, fluid status, and electrolytes closely.   2.  Acute gallstone pancreatitis: S/p ERCP with biliary stent placement. Currently on enteral feeds. Continues to have leukocytosis with fever of 100.5 overnight. Now with possible upper GI bleed. Abdomen less distended and nontender this AM  4.  Anemia, worsening 2/2 acute GI bleed: Hgb 6.5 overnight with possible coffee ground emesis from NG tube and bright red blood per rectum. S/p 2U pRBC. Hgb improved to 7.5 this AM. GI consulted for further work up. Heparin discontinued, Protonix initiated.   3.  Atrial fibrillation with rapid ventricular response: Currently on amiodarone drip. HR currently 100-110's.    5.  Leukocytosis: Finally had decline in WBC from 39>32.4 overnight. Did develop temp of 100.5. Demargination from severe pancreatitis vs upper GI bleed vs abscess. GI on board.   Subjective:   Continued to have hemodynamic instability overnight. Possible coffee ground emesis from NG tube plus BRBPR and Hgb of 6.5 requiring 2U pRBC and albumin. BP improved this AM. NO longer on pressures. Also developed temp of 100.5 overnight.   Patient notes she is doing okay this morning. Notes her abdomen is less tender. Having less discomfort since removal of the rectal tube.    Objective:   BP (!) 116/50   Pulse (!) 102   Temp 99.6 F (37.6 C) (Oral)   Resp (!) 21   Ht 5' 0.98" (1.549  m)   Wt 77.3 kg   SpO2 96%   BMI 32.22 kg/m   Intake/Output Summary (Last 24 hours) at 12/19/2019 0749 Last data filed at 12/19/2019 0600 Gross per 24 hour  Intake 2011.44 ml  Output 1266 ml  Net 745.44 ml   Weight change: -3 kg  Physical Exam: General: ill appearing older female, NAD CV: regular rate and rhythm without murmurs, rubs, or gallops, trace LE edema bilaterally Lungs: clear to auscultation bilaterally with normal work of breathing on O2, no crackles appreciated  Abdomen: soft, non-tender, Less distended Skin: warm, dry Extremities: warm and well perfused  Imaging: No results found.  Labs: BMET Recent Labs  Lab 12/16/19 0446 12/16/19 0446 12/16/19 0954 12/16/19 1655 12/17/19 0335 12/17/19 1630 12/18/19 0454 12/18/19 1618 12/19/19 0419  NA 139   < > 136 139 139 138 138 136 135  K 4.3   < > 4.5 4.3 4.3 4.2 3.6 3.9 4.2  CL 101  --   --  101 99 98 101 101 100  CO2 22  --   --  20* 24 20* 24 24 22   GLUCOSE 142*  --   --  169* 177* 268* 248* 221* 285*  BUN 31*  --   --  26* 24* 27* 27* 25* 33*  CREATININE 3.10*  --   --  2.89* 2.55* 2.72* 2.45* 2.07* 2.40*  CALCIUM 7.8*  --   --  7.3* 8.0* 8.3* 8.2* 7.9* 7.7*  PHOS 3.4  --   --  3.1 2.4* 2.2* 2.2* 2.3* 2.7   < > =  values in this interval not displayed.   CBC Recent Labs  Lab 12/17/19 0335 12/18/19 0454 12/18/19 2211 12/19/19 0419  WBC 14.9* 20.5* 39.0* 32.4*  NEUTROABS  --  14.8*  --   --   HGB 8.8* 8.0* 6.5* 7.5*  HCT 27.7* 25.3* 21.0* 23.3*  MCV 87.4 87.8 88.2 87.6  PLT 126* 165 225 145*   Medications:    . B-complex with vitamin C  1 tablet Per Tube Daily  . Chlorhexidine Gluconate Cloth  6 each Topical Daily  . insulin aspart  0-9 Units Subcutaneous Q4H  . insulin aspart  4 Units Subcutaneous Q4H  . insulin glargine  7 Units Subcutaneous Daily  . pantoprazole (PROTONIX) IV  40 mg Intravenous Q12H  . sodium chloride flush  10-40 mL Intracatheter Q12H   Mina Marble, DO [facility] Family  Medicine Resident, PGY2 12/19/2019, 7:49 AM  I have seen and examined this patient and agree with plan and assessment in the above note with renal recommendations/intervention highlighted.  BP has improved and off of pressors.  Will follow renal function and UOP and decide on transition to IHD over the next 24-48 hours. Governor Rooks Jalia Zuniga,MD 12/19/2019 3:16 PM

## 2019-12-19 NOTE — Progress Notes (Signed)
CRRT filter clotted, no blood able to be returned to pt. Nephrology notified, due to pt being hypotensive and receiving blood products this shift and unable to pull any fluid on CRRT.  Per nephrology- okay to leave patient off of CRRT at this time and will be addressed later this morning.   RN will continue to monitor pt closely.

## 2019-12-19 NOTE — Interval H&P Note (Signed)
History and Physical Interval Note:  12/19/2019 3:11 PM  Katherine Oliver  has presented today for surgery, with the diagnosis of gallstone pancreatitis.  The various methods of treatment have been discussed with the patient and family. After consideration of risks, benefits and other options for treatment, the patient has consented to  Procedure(s): EGD with control of bleeding, as a surgical intervention.  The patient's history has been reviewed, patient examined, no change in status, stable for surgery.  I have reviewed the patient's chart and labs.  Questions were answered to the patient's satisfaction.     [name]

## 2019-12-19 NOTE — Progress Notes (Signed)
[place] Progress Note Patient Name: Katherine Oliver DOB: [birth year] MRN: [id number]   Date of Service  12/19/2019  HPI/Events of Note  Corrected calcium is 9.3 (albumin 2.5, calcium 7.7).   eICU Interventions  No intervention required.     Intervention Category Minor Interventions: Electrolytes abnormality - evaluation and management  Marily Lente Susumu Hackler 12/19/2019, 5:35 AM

## 2019-12-19 NOTE — Progress Notes (Signed)
Inpatient Diabetes Program Recommendations  AACE/ADA: New Consensus Statement on Inpatient Glycemic Control (2015)  Target Ranges:  Prepandial:   less than 140 mg/dL      Peak postprandial:   less than 180 mg/dL (1-2 hours)      Critically ill patients:  140 - 180 mg/dL   Results for Katherine Oliver, Katherine Oliver (MRN [id number]) as of 12/19/2019 07:07  Ref. Range 12/17/2019 23:14 12/18/2019 03:11 12/18/2019 07:46 12/18/2019 11:03 12/18/2019 15:03 12/18/2019 20:17  Glucose-Capillary Latest Ref Range: 70 - 99 mg/dL 191 (H)  6 units NOVOLOG  209 (H)  7 units NOVOLOG  246 (H)  7 units NOVOLOG  211 (H)  7 units NOVOLOG +  7 units LANTUS  203 (H)  7 units NOVOLOG  219 (H)  7 units NOVOLOG    Results for Katherine Oliver, Katherine Oliver (MRN [id number]) as of 12/19/2019 07:07  Ref. Range 12/19/2019 00:12 12/19/2019 04:14  Glucose-Capillary Latest Ref Range: 70 - 99 mg/dL 225 (H)  7 units NOVOLOG  274 (H)  9 units NOVOLOG      Home DM Meds: Metformin 500 mg Daily    Current Orders: Novolog Sensitive Correction Scale/ SSI (0-9 units Q4 hours         Novolog 4 units Q4 hours      Lantus 7 units Daily     MD- Please consider the following in-hospital insulin adjustments:  1. Increase Lantus slightly to 10 units Daily (~0.15 units/kg)  2. Increase Novolog Tube Feed Coverage to: Novolog 6 units Q4 hours  HOLD if tube feeds HELD for any reason     --Will follow patient during hospitalization--  Wyn Quaker RN, MSN, CDE Diabetes Coordinator Inpatient Glycemic Control Team Team Pager: [phone] (8a-5p)

## 2019-12-19 NOTE — Progress Notes (Signed)
NAME:  Katherine Oliver, MRN:  [id number], DOB:  May 08, 1946, LOS: 6 ADMISSION DATE:  12/13/2019, CONSULTATION DATE:  2/11 REFERRING MD:  Posey Pronto, CHIEF COMPLAINT:  Dyspnea   Brief History   74 y/o female admitted on 2/9 with gallstone pancreatitis and UTI.  Had ERCP on 2/10 with stent placement, PCCM consulted 2/11 with worsening pancreatic pain, increased tachycardia, increased work of breathing and supplemental oxygen use, worsening AKI.  Past Medical History  CKD stage III, allergic rhinitis, type 2 diabetes, GERD, hepatic steatosis, polycythemia  Significant Hospital Events   Admitted to the hospitalist service on 2/9, IV fluids initiated, Zosyn started for leukocytosis, and outpatient Enterococcus urinary tract infection.  Tachycardic on telemetry. 2/10: Underwent ERCP by Dr. Benson Norway: Noted be technically difficult and complex, successful with biliary sphincterotomy performed and plastic stent placed into the common bile duct she returned to the stepdown unit 2/11: Having worse pain but apparently initially improved in the early morning, no function remarkably worse increased from 1.57 on admission to 4.29.  She was started on PCA for pancreatitis related pain, nephrology consulted for worsening acute on chronic renal failure, surgery consulted for increased intrabladder pressure and concern for possible abdominal compartment syndrome, and critical care consulted for concern about overall clinical decline 2/12 afib rvr, cor trak placed, started tube feeding 2/13 PM afib with RVR, hypotensive, dilt held, started amiodarone due to hypotension  Consults:  GI ->Hung consulted 2/9 General surg consulted 2/11 for concern about abd compartment  Renal consulted for Acute on chronic renal failure, hyperkalemia, lactic acidosis  Procedures:  ERCP 2/10: completed successfully w/ stent placement   Significant Diagnostic Tests:  2/9 CT angio: 1. Acute pancreatitis likely secondary to an  obstructive stone in the distal common bile duct near the ampulla. 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 No evidence for pseudocyst formations at time. Mild intrahepatic and extrahepatic biliary dilatation. Subtle low-density along the posterior right hepatic lobe may be related to inflammation but this area is indeterminate. 2. Indeterminate left renal lesions. Unusual lesion or hypodensities in the posterior left kidney lower pole could represent areas of infarct, focal pyelonephritis or atypical neoplastic lesion. There is also a new indeterminate 1 cm lesion in the medial left kidney. Recommend follow-up renal MRI when patient is stable and can not tolerate an abdominal MRI. 2/11 CT abd/pelvis 1. Acute pancreatitis likely secondary to an obstructive stone in the distal common bile duct near the ampulla. 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 No evidence for pseudocyst formations at time. Mild intrahepatic and extrahepatic biliary dilatation. Subtle low-density along the posterior right hepatic lobe may be related to inflammation but this area is indeterminate. 2. Indeterminate left renal lesions. Unusual lesion or hypodensities in the posterior left kidney lower pole could represent areas of infarct, focal pyelonephritis or atypical neoplastic lesion. There is also a new indeterminate 1 cm lesion in the medial left kidney  Micro Data:  UC  2/9 enterococcus faecalis  UC 2/11>>> BCX2 2/11>>>  Antimicrobials:  Zosyn 2/9 >> 2/13  Interim history/subjective:  Low hemoglobin overnight transfused 1 unit.  Now having bloody stools and dark NG tube output.  Objective   Blood pressure (!) 116/50, pulse (!) 102, temperature 99.6 F (37.6 C), temperature source Oral, resp. rate (!) 21, height 5' 0.98" (1.549 m), weight 77.3 kg, SpO2 96 %. CVP:  [2 mmHg-8 mmHg] 2 mmHg      Intake/Output Summary (Last 24 hours) at 12/19/2019  0174 Last data filed at 12/19/2019 0600 Gross per 24 hour  Intake 1930.96 ml  Output 1156 ml  Net 774.96 ml   Filed Weights   12/17/19 0400 12/18/19 0600 12/19/19 0500  Weight: 78.3 kg 80.3 kg 77.3 kg    Examination:  General appearance: 74 y.o., female, NAD, conversant  Eyes: anicteric sclerae, moist conjunctivae; no lid-lag; PERRLA, tracking appropriately HENT: NCAT; oropharynx, MMM, no mucosal ulcerations; normal hard and soft palate Neck: Trachea midline; FROM, supple, lymphadenopathy, no JVD Lungs: CTAB, no crackles, no wheeze, with normal respiratory effort and no intercostal retractions CV: RRR, S1, S2, no MRGs  Abdomen: Soft, non-tender; non-distended, BS present  Extremities: No peripheral edema, radial and DP pulses present bilaterally  Skin: Normal temperature, turgor and texture; no rash Psych: Appropriate affect Neuro: Oriented to person    Resolved Hospital Problem list   ileus  Assessment & Plan:   Acute GI bleeding, acute blood loss anemia Gallstone pancreatitis post ERCP Pain under good control Acute hepatitis > improving N.p.o. at this time IV PPI twice daily Continue transfusion for a threshold of greater than 7. I have spoke with GI and they will come by today and se her again   Acute hypoxemic and hypercarib respiratory failure > due to atelectasis Wean FiO2 to maintain sats above 88% Out of bed to chair as much as tolerated Daily I-S as much as tolerated  Atrial fibrillation with RVR, resolved Remains on telemetry monitoring Continue amiodarone  Leukocytosis on 2/14, no fever, HD stable otherwise Monitor off antimicrobials  Acute on chronic renal failure Continue CVVHD per nephrology  Thrombocytopenia Monitor for any sign of bleeding, likely sepsis related, ICU related  Enterococcus UTI Off antimicrobials, completed course  DM2 with hyperglycemia SSI  Acute metabolic encephalopathy> improved Avoid sedating drugs As needed Haldol  for agitation  Nutrition needs Continue tube feeding  Physical deconditioning Out of bed as tolerated, PT OT eval  Best practice:  Diet: tube feeding Pain/Anxiety/Delirium protocol (if indicated): n/a VAP protocol (if indicated): n/a DVT prophylaxis: sub cutaneous heparin GI prophylaxis: famotidine Glucose control: SSI Mobility: bed rest Code Status: full code Family Communication: We will reach out to family. Disposition: remain in ICU  Labs   CBC: Recent Labs  Lab 12/16/19 0446 12/16/19 0446 12/16/19 0954 12/17/19 0335 12/18/19 0454 12/18/19 2211 12/19/19 0419  WBC 15.6*  --   --  14.9* 20.5* 39.0* 32.4*  NEUTROABS  --   --   --   --  14.8*  --   --   HGB 10.9*   < > 10.5* 8.8* 8.0* 6.5* 7.5*  HCT 35.0*   < > 31.0* 27.7* 25.3* 21.0* 23.3*  MCV 89.3  --   --  87.4 87.8 88.2 87.6  PLT 127*  --   --  126* 165 225 145*   < > = values in this interval not displayed.    Basic Metabolic Panel: Recent Labs  Lab 12/16/19 0446 12/16/19 0954 12/17/19 0335 12/17/19 1630 12/17/19 2308 12/18/19 0454 12/18/19 1618 12/19/19 0419  NA 139   < > 139 138  --  138 136 135  K 4.3   < > 4.3 4.2  --  3.6 3.9 4.2  CL 101   < > 99 98  --  101 101 100  CO2 22   < > 24 20*  --  24 24 22   GLUCOSE 142*   < > 177* 268*  --  248* 221* 285*  BUN 31*   < >  24* 27*  --  27* 25* 33*  CREATININE 3.10*   < > 2.55* 2.72*  --  2.45* 2.07* 2.40*  CALCIUM 7.8*   < > 8.0* 8.3*  --  8.2* 7.9* 7.7*  MG 2.3  --  2.5*  --  2.8* 2.6*  --  2.4  PHOS 3.4   < > 2.4* 2.2*  --  2.2* 2.3* 2.7   < > = values in this interval not displayed.   GFR: Estimated Creatinine Clearance: 19.1 mL/min (A) (by C-G formula based on SCr of 2.4 mg/dL (H)). Recent Labs  Lab 12/13/19 1400 12/13/19 2230 12/14/19 0215 12/15/19 0309 12/15/19 1011 12/16/19 0446 12/16/19 0900 12/17/19 0335 12/18/19 0454 12/18/19 2211 12/19/19 0419  PROCALCITON  --  3.47  --   --   --   --   --   --   --   --   --   WBC   < >  --   12.8*   < >  --    < >  --  14.9* 20.5* 39.0* 32.4*  LATICACIDVEN  --  6.2* 4.9*  --  2.2*  --  1.6  --   --   --   --    < > = values in this interval not displayed.    Liver Function Tests: Recent Labs  Lab 12/14/19 0215 12/14/19 0215 12/15/19 0309 12/15/19 0309 12/16/19 0446 12/16/19 1655 12/17/19 0335 12/17/19 1630 12/18/19 0454 12/18/19 1618 12/19/19 0419  AST 474*  --  421*  --  104*  --  52*  --  38  --   --   ALT 402*  --  268*  --  144*  --  106*  --  80*  --   --   ALKPHOS 75  --  63  --  71  --  66  --  140*  --   --   BILITOT 1.7*  --  1.6*  --  1.5*  --  1.9*  --  1.0  --   --   PROT 7.0  --  6.1*  --  5.4*  --  5.5*  --  5.2*  --   --   ALBUMIN 4.5   < > 3.4*   < > 2.8*   < > 2.5* 2.8* 2.4* 2.6* 2.5*   < > = values in this interval not displayed.   Recent Labs  Lab 12/13/19 1400 12/14/19 0215 12/15/19 0309 12/16/19 0900  LIPASE 5,205* 1,048* 2,210* 173*   Recent Labs  Lab 12/16/19 0900  AMMONIA 47*    ABG    Component Value Date/Time   PHART 7.320 (L) 12/16/2019 0954   PCO2ART 40.1 12/16/2019 0954   PO2ART 63.0 (L) 12/16/2019 0954   HCO3 20.7 12/16/2019 0954   TCO2 22 12/16/2019 0954   ACIDBASEDEF 5.0 (H) 12/16/2019 0954   O2SAT 90.0 12/16/2019 0954     Coagulation Profile: No results for input(s): INR, PROTIME in the last 168 hours.  Cardiac Enzymes: Recent Labs  Lab 12/14/19 0215  CKTOTAL 38    HbA1C: Hgb A1c MFr Bld  Date/Time Value Ref Range Status  12/14/2019 02:15 AM 6.4 (H) 4.8 - 5.6 % Final    Comment:    (NOTE)         Prediabetes: 5.7 - 6.4         Diabetes: >6.4         Glycemic control for adults with diabetes: <7.0  08/23/2019 09:19 AM 6.3 4.6 - 6.5 % Final    Comment:    Glycemic Control Guidelines for People with Diabetes:Non Diabetic:  <6%Goal of Therapy: <7%Additional Action Suggested:  >8%     CBG: Recent Labs  Lab 12/18/19 1503 12/18/19 2017 12/19/19 0012 12/19/19 0414 12/19/19 0730  GLUCAP 203*  219* 225* 274* 274*     This patient is critically ill with multiple organ system failure; which, requires frequent high complexity decision making, assessment, support, evaluation, and titration of therapies. This was completed through the application of advanced monitoring technologies and extensive interpretation of multiple databases. During this encounter critical care time was devoted to patient care services described in this note for 32 minutes.  Garner Nash, DO [facility] 12/19/2019 8:09 AM

## 2019-12-19 NOTE — H&P (View-Only) (Signed)
NAME:  Katherine Oliver, MRN:  [id number], DOB:  1946-02-10, LOS: 6 ADMISSION DATE:  12/13/2019, CONSULTATION DATE:  2/11 REFERRING MD:  Posey Pronto, CHIEF COMPLAINT:  Dyspnea   Brief History   74 y/o female admitted on 2/9 with gallstone pancreatitis and UTI.  Had ERCP on 2/10 with stent placement, PCCM consulted 2/11 with worsening pancreatic pain, increased tachycardia, increased work of breathing and supplemental oxygen use, worsening AKI.  Past Medical History  CKD stage III, allergic rhinitis, type 2 diabetes, GERD, hepatic steatosis, polycythemia  Significant Hospital Events   Admitted to the hospitalist service on 2/9, IV fluids initiated, Zosyn started for leukocytosis, and outpatient Enterococcus urinary tract infection.  Tachycardic on telemetry. 2/10: Underwent ERCP by Dr. Benson Norway: Noted be technically difficult and complex, successful with biliary sphincterotomy performed and plastic stent placed into the common bile duct she returned to the stepdown unit 2/11: Having worse pain but apparently initially improved in the early morning, no function remarkably worse increased from 1.57 on admission to 4.29.  She was started on PCA for pancreatitis related pain, nephrology consulted for worsening acute on chronic renal failure, surgery consulted for increased intrabladder pressure and concern for possible abdominal compartment syndrome, and critical care consulted for concern about overall clinical decline 2/12 afib rvr, cor trak placed, started tube feeding 2/13 PM afib with RVR, hypotensive, dilt held, started amiodarone due to hypotension  Consults:  GI ->Hung consulted 2/9 General surg consulted 2/11 for concern about abd compartment  Renal consulted for Acute on chronic renal failure, hyperkalemia, lactic acidosis  Procedures:  ERCP 2/10: completed successfully w/ stent placement   Significant Diagnostic Tests:  2/9 CT angio: 1. Acute pancreatitis likely secondary to an  obstructive stone in the distal common bile duct near the ampulla. 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 No evidence for pseudocyst formations at time. Mild intrahepatic and extrahepatic biliary dilatation. Subtle low-density along the posterior right hepatic lobe may be related to inflammation but this area is indeterminate. 2. Indeterminate left renal lesions. Unusual lesion or hypodensities in the posterior left kidney lower pole could represent areas of infarct, focal pyelonephritis or atypical neoplastic lesion. There is also a new indeterminate 1 cm lesion in the medial left kidney. Recommend follow-up renal MRI when patient is stable and can not tolerate an abdominal MRI. 2/11 CT abd/pelvis 1. Acute pancreatitis likely secondary to an obstructive stone in the distal common bile duct near the ampulla. 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 No evidence for pseudocyst formations at time. Mild intrahepatic and extrahepatic biliary dilatation. Subtle low-density along the posterior right hepatic lobe may be related to inflammation but this area is indeterminate. 2. Indeterminate left renal lesions. Unusual lesion or hypodensities in the posterior left kidney lower pole could represent areas of infarct, focal pyelonephritis or atypical neoplastic lesion. There is also a new indeterminate 1 cm lesion in the medial left kidney  Micro Data:  UC  2/9 enterococcus faecalis  UC 2/11>>> BCX2 2/11>>>  Antimicrobials:  Zosyn 2/9 >> 2/13  Interim history/subjective:  Low hemoglobin overnight transfused 1 unit.  Now having bloody stools and dark NG tube output.  Objective   Blood pressure (!) 116/50, pulse (!) 102, temperature 99.6 F (37.6 C), temperature source Oral, resp. rate (!) 21, height 5' 0.98" (1.549 m), weight 77.3 kg, SpO2 96 %. CVP:  [2 mmHg-8 mmHg] 2 mmHg      Intake/Output Summary (Last 24 hours) at 12/19/2019  3825  Last data filed at 12/19/2019 0600 Gross per 24 hour  Intake 1930.96 ml  Output 1156 ml  Net 774.96 ml   Filed Weights   12/17/19 0400 12/18/19 0600 12/19/19 0500  Weight: 78.3 kg 80.3 kg 77.3 kg    Examination:  General appearance: 74 y.o., female, NAD, conversant  Eyes: anicteric sclerae, moist conjunctivae; no lid-lag; PERRLA, tracking appropriately HENT: NCAT; oropharynx, MMM, no mucosal ulcerations; normal hard and soft palate Neck: Trachea midline; FROM, supple, lymphadenopathy, no JVD Lungs: CTAB, no crackles, no wheeze, with normal respiratory effort and no intercostal retractions CV: RRR, S1, S2, no MRGs  Abdomen: Soft, non-tender; non-distended, BS present  Extremities: No peripheral edema, radial and DP pulses present bilaterally  Skin: Normal temperature, turgor and texture; no rash Psych: Appropriate affect Neuro: Oriented to person    Resolved Hospital Problem list   ileus  Assessment & Plan:   Acute GI bleeding, acute blood loss anemia Gallstone pancreatitis post ERCP Pain under good control Acute hepatitis > improving N.p.o. at this time IV PPI twice daily Continue transfusion for a threshold of greater than 7. I have spoke with GI and they will come by today and se her again   Acute hypoxemic and hypercarib respiratory failure > due to atelectasis Wean FiO2 to maintain sats above 88% Out of bed to chair as much as tolerated Daily I-S as much as tolerated  Atrial fibrillation with RVR, resolved Remains on telemetry monitoring Continue amiodarone  Leukocytosis on 2/14, no fever, HD stable otherwise Monitor off antimicrobials  Acute on chronic renal failure Continue CVVHD per nephrology  Thrombocytopenia Monitor for any sign of bleeding, likely sepsis related, ICU related  Enterococcus UTI Off antimicrobials, completed course  DM2 with hyperglycemia SSI  Acute metabolic encephalopathy> improved Avoid sedating drugs As needed Haldol for  agitation  Nutrition needs Continue tube feeding  Physical deconditioning Out of bed as tolerated, PT OT eval  Best practice:  Diet: tube feeding Pain/Anxiety/Delirium protocol (if indicated): n/a VAP protocol (if indicated): n/a DVT prophylaxis: sub cutaneous heparin GI prophylaxis: famotidine Glucose control: SSI Mobility: bed rest Code Status: full code Family Communication: We will reach out to family. Disposition: remain in ICU  Labs   CBC: Recent Labs  Lab 12/16/19 0446 12/16/19 0446 12/16/19 0954 12/17/19 0335 12/18/19 0454 12/18/19 2211 12/19/19 0419  WBC 15.6*  --   --  14.9* 20.5* 39.0* 32.4*  NEUTROABS  --   --   --   --  14.8*  --   --   HGB 10.9*   < > 10.5* 8.8* 8.0* 6.5* 7.5*  HCT 35.0*   < > 31.0* 27.7* 25.3* 21.0* 23.3*  MCV 89.3  --   --  87.4 87.8 88.2 87.6  PLT 127*  --   --  126* 165 225 145*   < > = values in this interval not displayed.    Basic Metabolic Panel: Recent Labs  Lab 12/16/19 0446 12/16/19 0954 12/17/19 0335 12/17/19 1630 12/17/19 2308 12/18/19 0454 12/18/19 1618 12/19/19 0419  NA 139   < > 139 138  --  138 136 135  K 4.3   < > 4.3 4.2  --  3.6 3.9 4.2  CL 101   < > 99 98  --  101 101 100  CO2 22   < > 24 20*  --  24 24 22   GLUCOSE 142*   < > 177* 268*  --  248* 221* 285*  BUN  31*   < > 24* 27*  --  27* 25* 33*  CREATININE 3.10*   < > 2.55* 2.72*  --  2.45* 2.07* 2.40*  CALCIUM 7.8*   < > 8.0* 8.3*  --  8.2* 7.9* 7.7*  MG 2.3  --  2.5*  --  2.8* 2.6*  --  2.4  PHOS 3.4   < > 2.4* 2.2*  --  2.2* 2.3* 2.7   < > = values in this interval not displayed.   GFR: Estimated Creatinine Clearance: 19.1 mL/min (A) (by C-G formula based on SCr of 2.4 mg/dL (H)). Recent Labs  Lab 12/13/19 1400 12/13/19 2230 12/14/19 0215 12/15/19 0309 12/15/19 1011 12/16/19 0446 12/16/19 0900 12/17/19 0335 12/18/19 0454 12/18/19 2211 12/19/19 0419  PROCALCITON  --  3.47  --   --   --   --   --   --   --   --   --   WBC   < >  --   12.8*   < >  --    < >  --  14.9* 20.5* 39.0* 32.4*  LATICACIDVEN  --  6.2* 4.9*  --  2.2*  --  1.6  --   --   --   --    < > = values in this interval not displayed.    Liver Function Tests: Recent Labs  Lab 12/14/19 0215 12/14/19 0215 12/15/19 0309 12/15/19 0309 12/16/19 0446 12/16/19 1655 12/17/19 0335 12/17/19 1630 12/18/19 0454 12/18/19 1618 12/19/19 0419  AST 474*  --  421*  --  104*  --  52*  --  38  --   --   ALT 402*  --  268*  --  144*  --  106*  --  80*  --   --   ALKPHOS 75  --  63  --  71  --  66  --  140*  --   --   BILITOT 1.7*  --  1.6*  --  1.5*  --  1.9*  --  1.0  --   --   PROT 7.0  --  6.1*  --  5.4*  --  5.5*  --  5.2*  --   --   ALBUMIN 4.5   < > 3.4*   < > 2.8*   < > 2.5* 2.8* 2.4* 2.6* 2.5*   < > = values in this interval not displayed.   Recent Labs  Lab 12/13/19 1400 12/14/19 0215 12/15/19 0309 12/16/19 0900  LIPASE 5,205* 1,048* 2,210* 173*   Recent Labs  Lab 12/16/19 0900  AMMONIA 47*    ABG    Component Value Date/Time   PHART 7.320 (L) 12/16/2019 0954   PCO2ART 40.1 12/16/2019 0954   PO2ART 63.0 (L) 12/16/2019 0954   HCO3 20.7 12/16/2019 0954   TCO2 22 12/16/2019 0954   ACIDBASEDEF 5.0 (H) 12/16/2019 0954   O2SAT 90.0 12/16/2019 0954     Coagulation Profile: No results for input(s): INR, PROTIME in the last 168 hours.  Cardiac Enzymes: Recent Labs  Lab 12/14/19 0215  CKTOTAL 38    HbA1C: Hgb A1c MFr Bld  Date/Time Value Ref Range Status  12/14/2019 02:15 AM 6.4 (H) 4.8 - 5.6 % Final    Comment:    (NOTE)         Prediabetes: 5.7 - 6.4         Diabetes: >6.4         Glycemic control  for adults with diabetes: <7.0   08/23/2019 09:19 AM 6.3 4.6 - 6.5 % Final    Comment:    Glycemic Control Guidelines for People with Diabetes:Non Diabetic:  <6%Goal of Therapy: <7%Additional Action Suggested:  >8%     CBG: Recent Labs  Lab 12/18/19 1503 12/18/19 2017 12/19/19 0012 12/19/19 0414 12/19/19 0730  GLUCAP 203*  219* 225* 274* 274*     This patient is critically ill with multiple organ system failure; which, requires frequent high complexity decision making, assessment, support, evaluation, and titration of therapies. This was completed through the application of advanced monitoring technologies and extensive interpretation of multiple databases. During this encounter critical care time was devoted to patient care services described in this note for 32 minutes.  Garner Nash, DO [facility] 12/19/2019 8:09 AM

## 2019-12-19 NOTE — Op Note (Addendum)
[facility] Patient Name: Katherine Oliver Procedure Date : 12/19/2019 MRN: [id number] Attending MD: [name] , MD Date of Birth: December 22, 1945 CSN: [id number] Age: 74 Admit Type: Inpatient Procedure:                Diagnostic EGD. Indications:              Acute post hemorrhagic anemia, Coffee ground                            emesis/hematemesis, Hematochezia Providers:                [name], MD, Marguerita Merles, Technician,                            [facility] RN, Benay Pillow, RN Referring MD:             CCM Medicines:                Fentanyl 50 micrograms & Midazolam 4.5 mg IV. Complications:            No immediate complications. Estimated Blood Loss:     Estimated blood loss during the procedure was                            minimal. Procedure:                Pre-Anesthesia Assessment: - Prior to the                            procedure, a history and physical was performed,                            and patient medications and allergies were                            reviewed. The patient's tolerance of previous                            anesthesia was also reviewed. The risks and                            benefits of the procedure and the sedation options                            and risks were discussed with the patient. All                            questions were answered, and informed consent was                            obtained. Prior Anticoagulants: The patient has                            taken no previous anticoagulant or antiplatelet  agents. ASA Grade Assessment: IV - A patient with                            severe systemic disease that is a constant threat                            to life. After reviewing the risks and benefits,                            the patient was deemed in satisfactory condition to                            undergo the procedure. After obtaining informed            consent, the endoscope was passed under direct                            vision. Throughout the procedure, the patient's                            blood pressure, pulse, and oxygen saturations were                            monitored continuously. The GIF-H190 ([id number])                            Olympus gastroscope was introduced through the                            mouth, and advanced to the second part of duodenum.                            The EGD was accomplished without difficulty. The                            patient tolerated the procedure well. Scope In: Scope Out: Findings:      The scope was passed along the Core track after the NGT was removed;       there was evidence of Grade II distal esophagitis at the GEJ; SCJ was       measured at 38 cm      There were several ulcers in the body along the GC and two of the larger       ones measuring about 7-8 mm had a pigmented spot; there were several       smaller ulcers distal to the 2 larger ones; fresh blood was seen at the       margin of all the ulcers but no visble vessel was noted; these were       cratered gastric ulcers with a flat pigmented spot (Forrest Class IIc);       patchy gastritis was along noted along with NGT trauma in the proximal       stomach; a lot of black debris was suctioned from the proximal stomach       before these ulcers could be visualized.      The cardia  and gastric fundus were normal on retroflexion.      The examined duodenum appeared normal; there was no old or fresh blood       noted in the entire duodenum which makes this bleed unlikely from a       recent sphincterotomy. Impression:               - Grade II distal esophagitis; otherwise normal                            appearing esophagus.                           - Multiple gastric ulcers, 2 larger ulcers with a                            flat pigmented spot (Forrest Class IIc)-likely                            stress  ulcers.                           - Patchy gastritis with some NGT trauma in the                            proximal stomach.                           - Normal examined duodenum-no fresh or old heme in                            the entire duodenum-this does not appear to be                            post-sphincterotomy bleed.                           - No specimens collected. Moderate Sedation:      Moderate (conscious) sedation was administered by the endoscopy nurse       and supervised by the endoscopist. The patient's oxygen saturation,       heart rate, blood pressure and response to care were monitored. Total       physician intraservice time was 13 minutes. Recommendation:           - Chips and sips today.                           - Continue present medications.                           - Start Carfate suspension 1 gm QID PO.                           - Elevate the head end of her bed.                           - Serial CBC's Procedure Code(s):        ---  Professional ---                           [phone], Esophagogastroduodenoscopy, flexible,                            transoral; diagnostic, including collection of                            specimen(s) by brushing or washing, when performed                            (separate procedure) Diagnosis Code(s):        --- Professional ---                           K92.0, Hematemesis                           K92.1, Melena (includes Hematochezia)                           D62, Acute posthemorrhagic anemia                           K25.0, Acute gastric ulcer with hemorrhage CPT copyright 2019 [employer]. All rights reserved. The codes documented in this report are preliminary and upon coder review may  be revised to meet current compliance requirements. [name], MD [name], MD 12/19/2019 3:48:53 PM This report has been signed electronically. Number of Addenda: 0

## 2019-12-20 LAB — TYPE AND SCREEN
ABO/RH(D): B POS
Antibody Screen: NEGATIVE
Unit division: 0
Unit division: 0

## 2019-12-20 LAB — GLUCOSE, CAPILLARY
Glucose-Capillary: 193 mg/dL — ABNORMAL HIGH (ref 70–99)
Glucose-Capillary: 227 mg/dL — ABNORMAL HIGH (ref 70–99)
Glucose-Capillary: 280 mg/dL — ABNORMAL HIGH (ref 70–99)
Glucose-Capillary: 258 mg/dL — ABNORMAL HIGH (ref 70–99)
Glucose-Capillary: 243 mg/dL — ABNORMAL HIGH (ref 70–99)

## 2019-12-20 LAB — CBC
HCT: 26.7 % — ABNORMAL LOW (ref 36.0–46.0)
Hemoglobin: 8.6 g/dL — ABNORMAL LOW (ref 12.0–15.0)
MCH: 28.8 pg (ref 26.0–34.0)
MCHC: 32.2 g/dL (ref 30.0–36.0)
MCV: 89.3 fL (ref 80.0–100.0)
Platelets: 114 10*3/uL — ABNORMAL LOW (ref 150–400)
RBC: 2.99 MIL/uL — ABNORMAL LOW (ref 3.87–5.11)
RDW: 15.8 % — ABNORMAL HIGH (ref 11.5–15.5)
WBC: 28 10*3/uL — ABNORMAL HIGH (ref 4.0–10.5)
nRBC: 2.1 % — ABNORMAL HIGH (ref 0.0–0.2)

## 2019-12-20 LAB — BPAM RBC
Blood Product Expiration Date: 202103172359
Blood Product Expiration Date: 202103172359
ISSUE DATE / TIME: 202102150016
ISSUE DATE / TIME: 202102150831
Unit Type and Rh: 7300
Unit Type and Rh: 7300

## 2019-12-20 LAB — RENAL FUNCTION PANEL
Albumin: 2.4 g/dL — ABNORMAL LOW (ref 3.5–5.0)
Anion gap: 17 — ABNORMAL HIGH (ref 5–15)
BUN: 56 mg/dL — ABNORMAL HIGH (ref 8–23)
CO2: 18 mmol/L — ABNORMAL LOW (ref 22–32)
Calcium: 7.7 mg/dL — ABNORMAL LOW (ref 8.9–10.3)
Chloride: 100 mmol/L (ref 98–111)
Creatinine, Ser: 4.56 mg/dL — ABNORMAL HIGH (ref 0.44–1.00)
GFR calc Af Amer: 10 mL/min — ABNORMAL LOW (ref >60)
GFR calc non Af Amer: 9 mL/min — ABNORMAL LOW (ref >60)
Glucose, Bld: 227 mg/dL — ABNORMAL HIGH (ref 70–99)
Phosphorus: 3.5 mg/dL (ref 2.5–4.6)
Potassium: 4.1 mmol/L (ref 3.5–5.1)
Sodium: 135 mmol/L (ref 135–145)

## 2019-12-20 LAB — MAGNESIUM: Magnesium: 2.4 mg/dL (ref 1.7–2.4)

## 2019-12-20 MED ORDER — BOOST / RESOURCE BREEZE PO LIQD CUSTOM
1.0000 | Freq: Three times a day (TID) | ORAL | Status: DC
  Administered 2019-12-20: 1 via ORAL

## 2019-12-20 MED ORDER — AMIODARONE HCL 200 MG PO TABS
200.0000 mg | ORAL_TABLET | Freq: Every day | ORAL | Status: DC
  Administered 2019-12-20 – 2020-01-03 (×13): 200 mg via ORAL
  Filled 2019-12-20 (×14): qty 1

## 2019-12-20 MED ORDER — INSULIN GLARGINE 100 UNIT/ML ~~LOC~~ SOLN
14.0000 [IU] | Freq: Every day | SUBCUTANEOUS | Status: DC
  Administered 2019-12-20 – 2020-01-03 (×15): 14 [IU] via SUBCUTANEOUS
  Filled 2019-12-20 (×15): qty 0.14

## 2019-12-20 MED ORDER — PANTOPRAZOLE SODIUM 40 MG IV SOLR
40.0000 mg | Freq: Two times a day (BID) | INTRAVENOUS | Status: AC
  Administered 2019-12-20 – 2019-12-21 (×4): 40 mg via INTRAVENOUS
  Filled 2019-12-20 (×4): qty 40

## 2019-12-20 MED ORDER — PANTOPRAZOLE SODIUM 40 MG PO TBEC
40.0000 mg | DELAYED_RELEASE_TABLET | Freq: Two times a day (BID) | ORAL | Status: DC
  Administered 2019-12-22 – 2020-01-03 (×22): 40 mg via ORAL
  Filled 2019-12-20 (×24): qty 1

## 2019-12-20 MED ORDER — ORAL CARE MOUTH RINSE
15.0000 mL | Freq: Two times a day (BID) | OROMUCOSAL | Status: DC
  Administered 2019-12-20: 10:00:00 15 mL via OROMUCOSAL

## 2019-12-20 MED ORDER — CHLORHEXIDINE GLUCONATE CLOTH 2 % EX PADS: 6.0000 | MEDICATED_PAD | Freq: Every day | CUTANEOUS | Status: DC

## 2019-12-20 NOTE — Progress Notes (Signed)
Patient ID: Katherine Oliver, female   DOB: [birth year], 74 y.o.   MRN: [id number] S: Feels better today.  Sitting in bed drinking ginger ale O:BP (!) 130/57   Pulse 92   Temp 97.8 F (36.6 C) (Oral)   Resp (!) 32   Ht 5' 0.98" (1.549 m)   Wt 79.6 kg   SpO2 95%   BMI 33.18 kg/m   Intake/Output Summary (Last 24 hours) at 12/20/2019 0944 Last data filed at 12/20/2019 0900 Gross per 24 hour  Intake 1054.96 ml  Output 16 ml  Net 1038.96 ml   Intake/Output: I/O last 3 completed shifts: In: 2285.2 [P.O.:360; I.V.:618.6; Blood:630; NG/GT:576.7; IV Piggyback:100] Out: 937 [Urine:21; Emesis/NG output:300; Other:42; Stool:150]  Intake/Output this shift:  Total I/O In: 49.7 [I.V.:49.7] Out: -  Weight change: 2.3 kg Gen: NAD CVS: no rub Resp: cta Abd: +BS, soft, NT/ND Ext: no edema  Recent Labs  Lab 12/13/19 1400 12/13/19 1400 12/13/19 2230 12/13/19 2230 12/14/19 0215 12/14/19 0215 12/15/19 0309 12/15/19 1500 12/16/19 0446 12/16/19 0954 12/17/19 0335 12/17/19 1630 12/18/19 0454 12/18/19 1618 12/19/19 0419 12/19/19 1831 12/20/19 0405  NA 140   < > 141   < > 139   < > 138   < > 139   < > 139 138 138 136 135 136 135  K 3.6   < > 4.8   < > 5.8*   < > 5.7*   < > 4.3   < > 4.3 4.2 3.6 3.9 4.2 3.7 4.1  CL 101   < > 103   < > 102   < > 103   < > 101   < > 99 98 101 101 100 100 100  CO2 22   < > 18*   < > 19*   < > 21*   < > 22   < > 24 20* 24 24 22 23  18*  GLUCOSE 219*   < > 265*   < > 267*   < > 183*   < > 142*   < > 177* 268* 248* 221* 285* 210* 227*  BUN 26*   < > 25*   < > 27*   < > 47*   < > 31*   < > 24* 27* 27* 25* 33* 50* 56*  CREATININE 1.09*   < > 1.57*   < > 1.83*   < > 4.29*   < > 3.10*   < > 2.55* 2.72* 2.45* 2.07* 2.40* 3.72* 4.56*  ALBUMIN 4.9   < > 4.6   < > 4.5   < > 3.4*  --  2.8*   < > 2.5* 2.8* 2.4* 2.6* 2.5* 2.5* 2.4*  CALCIUM 10.3   < > 9.8   < > 9.7   < > 8.6*   < > 7.8*   < > 8.0* 8.3* 8.2* 7.9* 7.7* 7.7* 7.7*  PHOS  --   --   --   --  5.2*   < >   --   --  3.4   < > 2.4* 2.2* 2.2* 2.3* 2.7 2.9 3.5  AST 194*  --  542*  --  474*  --  421*  --  104*  --  52*  --  38  --   --   --   --   ALT 120*  --  390*  --  402*  --  268*  --  144*  --  106*  --  80*  --   --   --   --    < > = values in this interval not displayed.   Liver Function Tests: Recent Labs  Lab 12/16/19 0446 12/16/19 1655 12/17/19 0335 12/17/19 1630 12/18/19 0454 12/18/19 1618 12/19/19 0419 12/19/19 1831 12/20/19 0405  AST 104*  --  52*  --  38  --   --   --   --   ALT 144*  --  106*  --  80*  --   --   --   --   ALKPHOS 71  --  66  --  140*  --   --   --   --   BILITOT 1.5*  --  1.9*  --  1.0  --   --   --   --   PROT 5.4*  --  5.5*  --  5.2*  --   --   --   --   ALBUMIN 2.8*   < > 2.5*   < > 2.4*   < > 2.5* 2.5* 2.4*   < > = values in this interval not displayed.   Recent Labs  Lab 12/14/19 0215 12/15/19 0309 12/16/19 0900  LIPASE 1,048* 2,210* 173*   Recent Labs  Lab 12/16/19 0900  AMMONIA 47*   CBC: Recent Labs  Lab 12/17/19 0335 12/17/19 0335 12/18/19 0454 12/18/19 0454 12/18/19 2211 12/18/19 2211 12/19/19 0419 12/19/19 1244 12/20/19 0405  WBC 14.9*   < > 20.5*   < > 39.0*  --  32.4*  --  28.0*  NEUTROABS  --   --  14.8*  --   --   --   --   --   --   HGB 8.8*   < > 8.0*   < > 6.5*   < > 7.5* 9.2* 8.6*  HCT 27.7*   < > 25.3*   < > 21.0*   < > 23.3* 27.9* 26.7*  MCV 87.4  --  87.8  --  88.2  --  87.6  --  89.3  PLT 126*   < > 165   < > 225  --  145*  --  114*   < > = values in this interval not displayed.   Cardiac Enzymes: Recent Labs  Lab 12/14/19 0215  CKTOTAL 38   CBG: Recent Labs  Lab 12/19/19 1531 12/19/19 1923 12/19/19 2321 12/20/19 0331 12/20/19 0726  GLUCAP 232* 206* 188* 193* 227*    Iron Studies: No results for input(s): IRON, TIBC, TRANSFERRIN, FERRITIN in the last 72 hours. Studies/Results: No results found. Marland Kitchen amiodarone  200 mg Oral Daily  . B-complex with vitamin C  1 tablet Per Tube Daily  .  Chlorhexidine Gluconate Cloth  6 each Topical Daily  . feeding supplement  1 Container Oral TID BM  . insulin aspart  0-9 Units Subcutaneous Q4H  . insulin aspart  4 Units Subcutaneous Q4H  . insulin glargine  14 Units Subcutaneous Daily  . mouth rinse  15 mL Mouth Rinse BID  . pantoprazole (PROTONIX) IV  40 mg Intravenous Q12H   Followed by  . [START ON 12/22/2019] pantoprazole  40 mg Oral BID AC  . sodium chloride flush  10-40 mL Intracatheter Q12H  . sucralfate  1 g Per Tube Q6H    BMET    Component Value Date/Time   NA 135 12/20/2019 0405   K 4.1 12/20/2019 0405   CL 100 12/20/2019 0405  CO2 18 (L) 12/20/2019 0405   GLUCOSE 227 (H) 12/20/2019 0405   BUN 56 (H) 12/20/2019 0405   CREATININE 4.56 (H) 12/20/2019 0405   CREATININE 1.03 10/20/2013 1751   CALCIUM 7.7 (L) 12/20/2019 0405   GFRNONAA 9 (L) 12/20/2019 0405   GFRAA 10 (L) 12/20/2019 0405   CBC    Component Value Date/Time   WBC 28.0 (H) 12/20/2019 0405   RBC 2.99 (L) 12/20/2019 0405   HGB 8.6 (L) 12/20/2019 0405   HCT 26.7 (L) 12/20/2019 0405   PLT 114 (L) 12/20/2019 0405   MCV 89.3 12/20/2019 0405   MCH 28.8 12/20/2019 0405   MCHC 32.2 12/20/2019 0405   RDW 15.8 (H) 12/20/2019 0405   LYMPHSABS 1.4 12/18/2019 0454   MONOABS 2.3 (H) 12/18/2019 0454   EOSABS 0.0 12/18/2019 0454   BASOSABS 0.1 12/18/2019 0454     Assessment/Plan:  1. AKI- in setting of sepsis and severe acute pancreatitis +/- IV contrast exposure.   CRRT started on 12/15/19.  Stopped 12/18/19.  Remains oliguric/anuric.  Will plan to transition to IHD tomorrow and cont to follow for renal recovery 2. Acute gallstone pacreatitis- s/p ERCP with biliary stent placement.  On liquid diet 3. Anemia of critical illness and GIB- hgb improved after transfusion.  Continue to follow. 4. Atrial fibrillation with RVR- on amiodarone 5.  Leukocytosis- slowly improving.   Donetta Potts, MD [employer] [phone]

## 2019-12-20 NOTE — Progress Notes (Signed)
Transferred from 2 heart via bed, alert,oriented. Cor trak in situ. On O2 via nasal cannula @ 1 L/min.Foley cath in situ. Verbalized needs and attended to

## 2019-12-20 NOTE — Progress Notes (Signed)
Subjective: She reports feeling much better.  Nursing states that she had a good night.  Objective: Vital signs in last 24 hours: Temp:  [97.8 F (36.6 C)-99.6 F (37.6 C)] 98.2 F (36.8 C) (02/16 0332) Pulse Rate:  [79-107] 92 (02/16 0600) Resp:  [0-51] 24 (02/16 0600) BP: (105-158)/(44-105) 144/61 (02/16 0600) SpO2:  [91 %-100 %] 96 % (02/16 0600) Weight:  [79.6 kg] 79.6 kg (02/16 0500) Last BM Date: 12/20/19  Intake/Output from previous day: 02/15 0701 - 02/16 0700 In: 1138.7 [P.O.:360; I.V.:327; Blood:315; NG/GT:136.7] Out: 13 [Urine:13] Intake/Output this shift: Total I/O In: 487 [P.O.:360; I.V.:127] Out: 13 [Urine:13]  General appearance: alert and no distress Resp: clear to auscultation bilaterally Cardio: regular rate and rhythm GI: soft, non-tender; bowel sounds normal; no masses,  no organomegaly Extremities: extremities normal, atraumatic, no cyanosis or edema  Lab Results: Recent Labs    12/18/19 2211 12/18/19 2211 12/19/19 0419 12/19/19 1244 12/20/19 0405  WBC 39.0*  --  32.4*  --  28.0*  HGB 6.5*   < > 7.5* 9.2* 8.6*  HCT 21.0*   < > 23.3* 27.9* 26.7*  PLT 225  --  145*  --  114*   < > = values in this interval not displayed.   BMET Recent Labs    12/19/19 0419 12/19/19 1831 12/20/19 0405  NA 135 136 135  K 4.2 3.7 4.1  CL 100 100 100  CO2 22 23 18*  GLUCOSE 285* 210* 227*  BUN 33* 50* 56*  CREATININE 2.40* 3.72* 4.56*  CALCIUM 7.7* 7.7* 7.7*   LFT Recent Labs    12/18/19 0454 12/18/19 1618 12/20/19 0405  PROT 5.2*  --   --   ALBUMIN 2.4*   < > 2.4*  AST 38  --   --   ALT 80*  --   --   ALKPHOS 140*  --   --   BILITOT 1.0  --   --    < > = values in this interval not displayed.   PT/INR No results for input(s): LABPROT, INR in the last 72 hours. Hepatitis Panel No results for input(s): HEPBSAG, HCVAB, HEPAIGM, HEPBIGM in the last 72 hours. C-Diff No results for input(s): CDIFFTOX in the last 72 hours. Fecal Lactopherrin No  results for input(s): FECLLACTOFRN in the last 72 hours.  Studies/Results: No results found.  Medications:  Scheduled: . B-complex with vitamin C  1 tablet Per Tube Daily  . Chlorhexidine Gluconate Cloth  6 each Topical Daily  . insulin aspart  0-9 Units Subcutaneous Q4H  . insulin aspart  4 Units Subcutaneous Q4H  . insulin glargine  7 Units Subcutaneous Daily  . mouth rinse  15 mL Mouth Rinse BID  . pantoprazole (PROTONIX) IV  40 mg Intravenous Q12H  . sodium chloride flush  10-40 mL Intracatheter Q12H  . sucralfate  1 g Per Tube Q6H   Continuous: .  prismasol BGK 4/2.5 500 mL/hr at 12/18/19 0900  .  prismasol BGK 4/2.5 500 mL/hr at 12/18/19 2217  . sodium chloride 20 mL/hr at 12/19/19 1241  . amiodarone 30 mg/hr (12/20/19 0600)  . feeding supplement (VITAL AF 1.2 CAL) Stopped (12/19/19 0925)  . prismasol BGK 4/2.5 2,000 mL/hr at 12/18/19 1629    Assessment/Plan: 1) Gallstone pancreatitis. 2) ARF. 3) Stress ulcerations in the stomach.   She is making progress.  Currently she denies any abdominal pain and she feels ready to eat.  There is some urine production.  The evening  shift nurse documented 16 ml.  Her HGB is stable.  Plan: 1) Advance to a clear liquid diet. 2) Continue with PPI. 3) Dialysis per Renal.  LOS: 7 days   Jazlynn Nemetz D 12/20/2019, 6:35 AM

## 2019-12-20 NOTE — Progress Notes (Signed)
Report called to [facility] on unit 6N. No bed in room on 6N, 6N will call 2H when bed is on unit. Notified patient and patient's husband of new room number.

## 2019-12-20 NOTE — Progress Notes (Signed)
NAME:  Katherine Oliver, MRN:  [id number], DOB:  February 14, 1946, LOS: 7 ADMISSION DATE:  12/13/2019, CONSULTATION DATE:  2/11 REFERRING MD:  Posey Pronto, CHIEF COMPLAINT:  Dyspnea   Brief History   74 y/o female admitted on 2/9 with gallstone pancreatitis and UTI.  Had ERCP on 2/10 with stent placement, PCCM consulted 2/11 with worsening pancreatic pain, increased tachycardia, increased work of breathing and supplemental oxygen use, worsening AKI.  Past Medical History  CKD stage III, allergic rhinitis, type 2 diabetes, GERD, hepatic steatosis, polycythemia  Significant Hospital Events   Admitted to the hospitalist service on 2/9, IV fluids initiated, Zosyn started for leukocytosis, and outpatient Enterococcus urinary tract infection.  Tachycardic on telemetry. 2/10: Underwent ERCP by Dr. Benson Norway: Noted be technically difficult and complex, successful with biliary sphincterotomy performed and plastic stent placed into the common bile duct she returned to the stepdown unit 2/11: Having worse pain but apparently initially improved in the early morning, no function remarkably worse increased from 1.57 on admission to 4.29.  She was started on PCA for pancreatitis related pain, nephrology consulted for worsening acute on chronic renal failure, surgery consulted for increased intrabladder pressure and concern for possible abdominal compartment syndrome, and critical care consulted for concern about overall clinical decline 2/12 afib rvr, cor trak placed, started tube feeding 2/13 PM afib with RVR, hypotensive, dilt held, started amiodarone due to hypotension 2/15 EGD showed stress ulceration.  Consults:  GI ->Hung consulted 2/9 General surg consulted 2/11 for concern about abd compartment  Renal consulted for Acute on chronic renal failure, hyperkalemia, lactic acidosis  Procedures:  ERCP 2/10: completed successfully w/ stent placement   Significant Diagnostic Tests:  2/9 CT angio: 1. Acute  pancreatitis likely secondary to an obstructive stone in the distal common bile duct near the ampulla. 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 No evidence for pseudocyst formations at time. Mild intrahepatic and extrahepatic biliary dilatation. Subtle low-density along the posterior right hepatic lobe may be related to inflammation but this area is indeterminate. 2. Indeterminate left renal lesions. Unusual lesion or hypodensities in the posterior left kidney lower pole could represent areas of infarct, focal pyelonephritis or atypical neoplastic lesion. There is also a new indeterminate 1 cm lesion in the medial left kidney. Recommend follow-up renal MRI when patient is stable and can not tolerate an abdominal MRI. 2/11 CT abd/pelvis 1. Acute pancreatitis likely secondary to an obstructive stone in the distal common bile duct near the ampulla. 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 No evidence for pseudocyst formations at time. Mild intrahepatic and extrahepatic biliary dilatation. Subtle low-density along the posterior right hepatic lobe may be related to inflammation but this area is indeterminate. 2. Indeterminate left renal lesions. Unusual lesion or hypodensities in the posterior left kidney lower pole could represent areas of infarct, focal pyelonephritis or atypical neoplastic lesion. There is also a new indeterminate 1 cm lesion in the medial left kidney  Micro Data:  UC  2/9 enterococcus faecalis  UC 2/11>>> BCX2 2/11>>>  Antimicrobials:  Zosyn 2/9 >> 2/13  Interim history/subjective:  No further emesis. Denies abdominal pain, appetite improving and tolerating orals without nausea. No recurrence of AF off IV amiodarone.  Objective   Blood pressure (!) 142/53, pulse 96, temperature 98 F (36.7 C), temperature source Oral, resp. rate (!) 28, height 5' 0.98" (1.549 m), weight 79.6 kg, SpO2 96 %. CVP:  [4 mmHg-6 mmHg]  4 mmHg  Intake/Output Summary (Last 24 hours) at 12/20/2019 1425 Last data filed at 12/20/2019 1000 Gross per 24 hour  Intake 692.96 ml  Output 16 ml  Net 676.96 ml   Filed Weights   12/18/19 0600 12/19/19 0500 12/20/19 0500  Weight: 80.3 kg 77.3 kg 79.6 kg    Examination:  General appearance: 74 y.o., female, NAD, conversant  Eyes: anicteric sclerae, moist conjunctivae; no lid-lag; PERRLA, tracking appropriately HENT: NCAT; oropharynx, MMM, no mucosal ulcerations; normal hard and soft palate Neck: Trachea midline; FROM, supple, lymphadenopathy, no JVD Lungs: CTAB, no crackles, no wheeze, with normal respiratory effort and no intercostal retractions CV: RRR, S1, S2, no MRGs  Abdomen: Soft, non-tender; non-distended, BS present  Extremities: No peripheral edema, radial and DP pulses present bilaterally  Skin: Normal temperature, turgor and texture; no rash Psych: Appropriate affect Neuro: Oriented to person    Resolved Hospital Problem list   ileus  Assessment & Plan:   Acute GI bleeding, acute blood loss anemia Gallstone pancreatitis post ERCP Pain under good control Acute hepatitis > improving Advance diet. Continue PPI and Carafate  Acute hypoxemic and hypercarib respiratory failure > due to atelectasis Wean FiO2 to maintain sats above 88% Out of bed to chair as much as tolerated Daily I-S as much as tolerated  Atrial fibrillation with RVR, resolved Convert to oral amiodarone.  Consider discontinuing prior to discharge  Leukocytosis on 2/14, no fever, HD stable otherwise Monitor off antimicrobials  Acute on chronic renal failure Continue CVVHD per nephrology  Thrombocytopenia Monitor for any sign of bleeding, likely sepsis related, ICU related  Enterococcus UTI Off antimicrobials, completed course  DM2 with hyperglycemia SSI  Acute metabolic encephalopathy> improved Avoid sedating drugs As needed Haldol for agitation  Nutrition needs Stop tube  feeds and oral diet as tolerated. Consider removing nasogastric tube tomorrow.  Physical deconditioning Out of bed as tolerated, PT OT eval  Best practice:  Diet: tube feeding advance oral diet stop tube feeds. Pain/Anxiety/Delirium protocol (if indicated): n/a VAP protocol (if indicated): n/a DVT prophylaxis: sub cutaneous heparin GI prophylaxis: famotidine Glucose control: SSI Mobility: bed rest Code Status: full code Family Communication: We will reach out to family. Disposition: remain in ICU  Labs   CBC: Recent Labs  Lab 12/17/19 0335 12/17/19 0335 12/18/19 0454 12/18/19 2211 12/19/19 0419 12/19/19 1244 12/20/19 0405  WBC 14.9*  --  20.5* 39.0* 32.4*  --  28.0*  NEUTROABS  --   --  14.8*  --   --   --   --   HGB 8.8*   < > 8.0* 6.5* 7.5* 9.2* 8.6*  HCT 27.7*   < > 25.3* 21.0* 23.3* 27.9* 26.7*  MCV 87.4  --  87.8 88.2 87.6  --  89.3  PLT 126*  --  165 225 145*  --  114*   < > = values in this interval not displayed.    Basic Metabolic Panel: Recent Labs  Lab 12/17/19 0335 12/17/19 1630 12/17/19 2308 12/18/19 0454 12/18/19 1618 12/19/19 0419 12/19/19 1831 12/20/19 0405  NA 139   < >  --  138 136 135 136 135  K 4.3   < >  --  3.6 3.9 4.2 3.7 4.1  CL 99   < >  --  101 101 100 100 100  CO2 24   < >  --  24 24 22 23  18*  GLUCOSE 177*   < >  --  248* 221* 285* 210* 227*  BUN 24*   < >  --  27* 25* 33* 50* 56*  CREATININE 2.55*   < >  --  2.45* 2.07* 2.40* 3.72* 4.56*  CALCIUM 8.0*   < >  --  8.2* 7.9* 7.7* 7.7* 7.7*  MG 2.5*  --  2.8* 2.6*  --  2.4  --  2.4  PHOS 2.4*   < >  --  2.2* 2.3* 2.7 2.9 3.5   < > = values in this interval not displayed.   GFR: Estimated Creatinine Clearance: 10.2 mL/min (A) (by C-G formula based on SCr of 4.56 mg/dL (H)). Recent Labs  Lab 12/13/19 2230 12/14/19 0215 12/15/19 0309 12/15/19 1011 12/16/19 0446 12/16/19 0900 12/17/19 0335 12/18/19 0454 12/18/19 2211 12/19/19 0419 12/20/19 0405  PROCALCITON 3.47  --    --   --   --   --   --   --   --   --   --   WBC  --  12.8*   < >  --    < >  --    < > 20.5* 39.0* 32.4* 28.0*  LATICACIDVEN 6.2* 4.9*  --  2.2*  --  1.6  --   --   --   --   --    < > = values in this interval not displayed.    Liver Function Tests: Recent Labs  Lab 12/14/19 0215 12/14/19 0215 12/15/19 0309 12/15/19 0309 12/16/19 0446 12/16/19 1655 12/17/19 0335 12/17/19 1630 12/18/19 0454 12/18/19 1618 12/19/19 0419 12/19/19 1831 12/20/19 0405  AST 474*  --  421*  --  104*  --  52*  --  38  --   --   --   --   ALT 402*  --  268*  --  144*  --  106*  --  80*  --   --   --   --   ALKPHOS 75  --  63  --  71  --  66  --  140*  --   --   --   --   BILITOT 1.7*  --  1.6*  --  1.5*  --  1.9*  --  1.0  --   --   --   --   PROT 7.0  --  6.1*  --  5.4*  --  5.5*  --  5.2*  --   --   --   --   ALBUMIN 4.5   < > 3.4*   < > 2.8*   < > 2.5*   < > 2.4* 2.6* 2.5* 2.5* 2.4*   < > = values in this interval not displayed.   Recent Labs  Lab 12/14/19 0215 12/15/19 0309 12/16/19 0900  LIPASE 1,048* 2,210* 173*   Recent Labs  Lab 12/16/19 0900  AMMONIA 47*    ABG    Component Value Date/Time   PHART 7.320 (L) 12/16/2019 0954   PCO2ART 40.1 12/16/2019 0954   PO2ART 63.0 (L) 12/16/2019 0954   HCO3 20.7 12/16/2019 0954   TCO2 22 12/16/2019 0954   ACIDBASEDEF 5.0 (H) 12/16/2019 0954   O2SAT 90.0 12/16/2019 0954     Coagulation Profile: No results for input(s): INR, PROTIME in the last 168 hours.  Cardiac Enzymes: Recent Labs  Lab 12/14/19 0215  CKTOTAL 38    HbA1C: Hgb A1c MFr Bld  Date/Time Value Ref Range Status  12/14/2019 02:15 AM 6.4 (H) 4.8 - 5.6 % Final    Comment:    (NOTE)  Prediabetes: 5.7 - 6.4         Diabetes: >6.4         Glycemic control for adults with diabetes: <7.0   08/23/2019 09:19 AM 6.3 4.6 - 6.5 % Final    Comment:    Glycemic Control Guidelines for People with Diabetes:Non Diabetic:  <6%Goal of Therapy: <7%Additional Action  Suggested:  >8%     CBG: Recent Labs  Lab 12/19/19 1923 12/19/19 2321 12/20/19 0331 12/20/19 0726 12/20/19 1143  GLUCAP 206* 188* 193* 227* 280*   35 minutes spent with greater than 50% of time in counseling and coordination of care.  Kipp Brood, MD [facility] ICU Physician [place]  Pager: [phone] Mobile: [phone] After hours: [phone].  12/20/2019, 2:27 PM      12/20/2019 2:25 PM

## 2019-12-20 NOTE — Progress Notes (Signed)
Nutrition Follow-up  DOCUMENTATION CODES:   Not applicable  INTERVENTION:   Boost Breeze po TID, each supplement provides 250 kcal and 9 grams of protein   NUTRITION DIAGNOSIS:   Inadequate oral intake related to altered GI function, acute illness as evidenced by NPO status.  Being addressed via supplements  GOAL:   Patient will meet greater than or equal to 90% of their needs  Progressing  MONITOR:   Diet advancement, TF tolerance, Skin, Labs, Weight trends  REASON FOR ASSESSMENT:   Rounds    ASSESSMENT:   74 yo female admitted with severe sepsis with multi-orgran failure secondary to acute gallstone pancreatitis. PMH includes HTN, DM, CKD III, GERD, hepatic steatosis  2/09 Admitted 2/10 ERCP with stent placement 2/11 Transfer to ICU, CRRT initiated 2/12 Cortrak placed (tip at LOT), TF initiated 2/14 CRRT stopped 2/15 EGD: grade II distal esophagitis, multiple gastric ulcers 2/16 TF d/c, diet advanced  Feels better, diet advanced to CL today. Tolerating liquids thus far TF discontinued by MD but pt previously tolerating post-pyloric feedings at goal rate  Plan for iHD tomorrow, remains oliguric/anuric  Admit weight 78.3 kg; current wt 79.6 kg.   Labs: CBGs 188-280 Meds: B-complex with C, ss novolog, novolog q 4 hours, lantus, reglan prn   Diet Order:   Diet Order            Diet clear liquid Room service appropriate? Yes; Fluid consistency: Thin  Diet effective now              EDUCATION NEEDS:   Not appropriate for education at this time  Skin:  Skin Assessment: Reviewed RN Assessment  Last BM:  2/16  Height:   Ht Readings from Last 1 Encounters:  12/13/19 5' 0.98" (1.549 m)    Weight:   Wt Readings from Last 1 Encounters:  12/20/19 79.6 kg     BMI:  Body mass index is 33.18 kg/m.  Estimated Nutritional Needs:   Kcal:  1700-1900 kcals  Protein:  90-105 g  Fluid:  >/= 1.8 L   Kerman Passey MS, RDN, LDN, CNSC RD Pager  Number and Weekend/On-Call After Hours Pager Located in [place]

## 2019-12-20 NOTE — Plan of Care (Signed)
  Problem: Education: Goal: Knowledge of General Education information will improve Description: Including pain rating scale, medication(s)/side effects and non-pharmacologic comfort measures Outcome: Progressing   Problem: Clinical Measurements: Goal: Ability to maintain clinical measurements within normal limits will improve Outcome: Progressing Goal: Will remain free from infection Outcome: Progressing Goal: Respiratory complications will improve Outcome: Progressing   Problem: Coping: Goal: Level of anxiety will decrease Outcome: Progressing   Problem: Elimination: Goal: Will not experience complications related to bowel motility Outcome: Progressing   Problem: Safety: Goal: Ability to remain free from injury will improve Outcome: Progressing   Problem: Skin Integrity: Goal: Risk for impaired skin integrity will decrease Outcome: Progressing   Problem: Education: Goal: Knowledge of General Education information will improve Description: Including pain rating scale, medication(s)/side effects and non-pharmacologic comfort measures Outcome: Progressing   Problem: Coping: Goal: Level of anxiety will decrease Outcome: Progressing

## 2019-12-21 LAB — GLUCOSE, CAPILLARY
Glucose-Capillary: 136 mg/dL — ABNORMAL HIGH (ref 70–99)
Glucose-Capillary: 155 mg/dL — ABNORMAL HIGH (ref 70–99)
Glucose-Capillary: 168 mg/dL — ABNORMAL HIGH (ref 70–99)
Glucose-Capillary: 169 mg/dL — ABNORMAL HIGH (ref 70–99)
Glucose-Capillary: 188 mg/dL — ABNORMAL HIGH (ref 70–99)
Glucose-Capillary: 189 mg/dL — ABNORMAL HIGH (ref 70–99)

## 2019-12-21 LAB — RENAL FUNCTION PANEL
Albumin: 2 g/dL — ABNORMAL LOW (ref 3.5–5.0)
Anion gap: 17 — ABNORMAL HIGH (ref 5–15)
BUN: 73 mg/dL — ABNORMAL HIGH (ref 8–23)
CO2: 18 mmol/L — ABNORMAL LOW (ref 22–32)
Calcium: 8.1 mg/dL — ABNORMAL LOW (ref 8.9–10.3)
Chloride: 98 mmol/L (ref 98–111)
Creatinine, Ser: 6.9 mg/dL — ABNORMAL HIGH (ref 0.44–1.00)
GFR calc Af Amer: 6 mL/min — ABNORMAL LOW (ref >60)
GFR calc non Af Amer: 5 mL/min — ABNORMAL LOW (ref >60)
Glucose, Bld: 198 mg/dL — ABNORMAL HIGH (ref 70–99)
Phosphorus: 5.4 mg/dL — ABNORMAL HIGH (ref 2.5–4.6)
Potassium: 4.1 mmol/L (ref 3.5–5.1)
Sodium: 133 mmol/L — ABNORMAL LOW (ref 135–145)

## 2019-12-21 LAB — CBC
HCT: 26.5 % — ABNORMAL LOW (ref 36.0–46.0)
Hemoglobin: 8.5 g/dL — ABNORMAL LOW (ref 12.0–15.0)
MCH: 28.9 pg (ref 26.0–34.0)
MCHC: 32.1 g/dL (ref 30.0–36.0)
MCV: 90.1 fL (ref 80.0–100.0)
Platelets: 105 10*3/uL — ABNORMAL LOW (ref 150–400)
RBC: 2.94 MIL/uL — ABNORMAL LOW (ref 3.87–5.11)
RDW: 16.6 % — ABNORMAL HIGH (ref 11.5–15.5)
WBC: 23.4 10*3/uL — ABNORMAL HIGH (ref 4.0–10.5)
nRBC: 0.4 % — ABNORMAL HIGH (ref 0.0–0.2)

## 2019-12-21 LAB — HEPATITIS B SURFACE ANTIGEN: Hepatitis B Surface Ag: NONREACTIVE

## 2019-12-21 LAB — HEPATITIS B SURFACE ANTIBODY,QUALITATIVE: Hep B S Ab: NONREACTIVE

## 2019-12-21 LAB — HEPATITIS B CORE ANTIBODY, TOTAL: Hep B Core Total Ab: NONREACTIVE

## 2019-12-21 MED ORDER — ALTEPLASE 2 MG IJ SOLR: 2.0000 mg | Freq: Once | INTRAMUSCULAR | Status: DC | PRN

## 2019-12-21 MED ORDER — LIDOCAINE HCL (PF) 1 % IJ SOLN: 5.0000 mL | INTRAMUSCULAR | Status: DC | PRN

## 2019-12-21 MED ORDER — PENTAFLUOROPROP-TETRAFLUOROETH EX AERO: 1.0000 "application " | INHALATION_SPRAY | CUTANEOUS | Status: DC | PRN

## 2019-12-21 MED ORDER — FENTANYL CITRATE (PF) 100 MCG/2ML IJ SOLN
25.0000 ug | INTRAMUSCULAR | Status: DC | PRN
  Administered 2019-12-21 – 2019-12-25 (×8): 25 ug via INTRAVENOUS
  Filled 2019-12-21 (×8): qty 2

## 2019-12-21 MED ORDER — FENTANYL CITRATE (PF) 100 MCG/2ML IJ SOLN
INTRAMUSCULAR | Status: AC
  Administered 2019-12-21: 25 ug via INTRAVENOUS
  Filled 2019-12-21: qty 2

## 2019-12-21 MED ORDER — SODIUM CHLORIDE 0.9 % IV SOLN: 100.0000 mL | INTRAVENOUS | Status: DC | PRN

## 2019-12-21 MED ORDER — LIDOCAINE-PRILOCAINE 2.5-2.5 % EX CREA: 1.0000 "application " | TOPICAL_CREAM | CUTANEOUS | Status: DC | PRN

## 2019-12-21 MED ORDER — HEPARIN SODIUM (PORCINE) 1000 UNIT/ML DIALYSIS
1000.0000 [IU] | INTRAMUSCULAR | Status: DC | PRN
  Administered 2019-12-21: 1000 [IU] via INTRAVENOUS_CENTRAL

## 2019-12-21 NOTE — Progress Notes (Addendum)
PROGRESS NOTE  Cindee Mclester Wanless EUM:[id number] DOB: 03-Feb-1946 DOA: 12/13/2019 PCP: [name], MD  HPI/Recap of past 24 hours: HPI from PCCM 74 y/o female admitted on 2/9 with gallstone pancreatitis and UTI.  Had ERCP on 2/10 with stent placement (technically difficult and complex), followed by worsening pancreatic pain, increased tachycardia, increased work of breathing and supplemental oxygen use, worsening AKI, PCCM consulted.  General surgery also consulted due to increased intrabladder pressure and concern for possible abdominal compartment syndrome.  On 2/12 patient went into A. fib with RVR, started on diltiazem, later became hypotensive and switched to amiodarone.  Patient also had an EGD done on 2/15 due to possible GI bleed.  EGD showed stress ulceration.  Due to some improvement in overall clinical status, [facility] hospitalist assumed care on 12/21/2019.    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, without long-term current use of insulin (HCC)   HTN (hypertension)   Dyslipidemia associated with type 2 diabetes mellitus (HCC)   NAFLD (nonalcoholic fatty liver disease)   GERD (gastroesophageal reflux disease)   Polycythemia   Obesity, Class I, BMI 30-34.9   CKD stage 3 due to type 2 diabetes mellitus ([place])   Acute gallstone pancreatitis   Sepsis ([place])   Acute renal failure (ARF) ([place])   Acute respiratory failure (HCC)   Choledocholithiasis   Gallstone pancreatitis post ERCP with biliary stent placement Acute hepatitis Acute blood loss anemia/GI bleeding 2/2 gastric/stress ulcers Hemoglobin currently stable status post 2 units of PRBC LFTs improved GI on board, okay to remove core track and advance diet Continue PPI, Carafate Daily CBC  AKI with oliguria/anuric In the setting of sepsis and severe  acute pancreatitis Status post CRRT, started on 2/11, stopped on 2/14 Currently on HD Nephrology on board  Leukocytosis Remains afebrile, hemodynamically stable No obvious signs of infection Continue to monitor off antibiotics Daily CBC  A. fib with RVR Possibly likely related to sepsis Currently rate controlled Currently on oral amiodarone, may discontinue on discharge if continues to remain sinus rhythm  Thrombocytopenia Monitor closely for any signs of bleeding Daily CBC  Acute hypoxemic and hypercarbic respiratory failure Chest x-ray showed atelectasis Currently on 1 L of supplemental oxygen, plan to wean off Continue incentive spirometry  Enterococcus UTI Completed antibiotics  Diabetes mellitus type 2 SSI, Lantus, NovoLog 3 times daily, Accu-Cheks, hypoglycemic protocol  Obesity Advise lifestyle modification  Physical deconditioning PT/OT Out of bed as tolerated        Malnutrition Type:  Nutrition Problem: Inadequate oral intake Etiology: altered GI function, acute illness   Malnutrition Characteristics:  Signs/Symptoms: NPO status   Nutrition Interventions:  Interventions: Refer to RD note for recommendations    Estimated body mass index is 33.88 kg/m as calculated from the following:   Height as of this encounter: 5' 0.98" (1.549 m).   Weight as of this encounter: 81.3 kg.     Code Status: Full  Family Communication: None at bedside  Disposition Plan: Once significant clinical improvement, consultant signing off, pending PT/OT evaluation   Consultants:  PCCM  GI  Nephrology  Procedures:  ERCP on 2/10  Antimicrobials:  None  DVT prophylaxis: SCD, due to GI bleed   Objective: Vitals:   12/21/19 1000 12/21/19 1030 12/21/19 1045 12/21/19 1212  BP: 109/68 (!) 129/59 (!) 141/47 (!) 129/59  Pulse: 94 96 95 (!)  106  Resp:   18 20  Temp:   97.8 F (36.6 C) 98.3 F (36.8 C)  TempSrc:   Oral Oral  SpO2:   98% 96%   Weight:   81.3 kg   Height:        Intake/Output Summary (Last 24 hours) at 12/21/2019 1605 Last data filed at 12/21/2019 1045 Gross per 24 hour  Intake 60 ml  Output 1075 ml  Net -1015 ml   Filed Weights   12/21/19 0644 12/21/19 0740 12/21/19 1045  Weight: 82.4 kg 82 kg 81.3 kg    Exam:  General: NAD, deconditioned  Cardiovascular: S1, S2 present  Respiratory: CTAB  Abdomen: Soft, nontender, nondistended, bowel sounds present  Musculoskeletal: No bilateral pedal edema noted  Skin: Normal  Psychiatry: Normal mood   Data Reviewed: CBC: Recent Labs  Lab 12/18/19 0454 12/18/19 0454 12/18/19 2211 12/19/19 0419 12/19/19 1244 12/20/19 0405 12/21/19 0820  WBC 20.5*  --  39.0* 32.4*  --  28.0* 23.4*  NEUTROABS 14.8*  --   --   --   --   --   --   HGB 8.0*   < > 6.5* 7.5* 9.2* 8.6* 8.5*  HCT 25.3*   < > 21.0* 23.3* 27.9* 26.7* 26.5*  MCV 87.8  --  88.2 87.6  --  89.3 90.1  PLT 165  --  225 145*  --  114* 105*   < > = values in this interval not displayed.   Basic Metabolic Panel: Recent Labs  Lab 12/17/19 0335 12/17/19 1630 12/17/19 2308 12/18/19 0454 12/18/19 0454 12/18/19 1618 12/19/19 0419 12/19/19 1831 12/20/19 0405 12/21/19 0819  NA 139   < >  --  138   < > 136 135 136 135 133*  K 4.3   < >  --  3.6   < > 3.9 4.2 3.7 4.1 4.1  CL 99   < >  --  101   < > 101 100 100 100 98  CO2 24   < >  --  24   < > 24 22 23  18* 18*  GLUCOSE 177*   < >  --  248*   < > 221* 285* 210* 227* 198*  BUN 24*   < >  --  27*   < > 25* 33* 50* 56* 73*  CREATININE 2.55*   < >  --  2.45*   < > 2.07* 2.40* 3.72* 4.56* 6.90*  CALCIUM 8.0*   < >  --  8.2*   < > 7.9* 7.7* 7.7* 7.7* 8.1*  MG 2.5*  --  2.8* 2.6*  --   --  2.4  --  2.4  --   PHOS 2.4*   < >  --  2.2*   < > 2.3* 2.7 2.9 3.5 5.4*   < > = values in this interval not displayed.   GFR: Estimated Creatinine Clearance: 6.8 mL/min (A) (by C-G formula based on SCr of 6.9 mg/dL (H)). Liver Function Tests: Recent Labs   Lab 12/15/19 0309 12/15/19 0309 12/16/19 0446 12/16/19 1655 12/17/19 0335 12/17/19 1630 12/18/19 0454 12/18/19 0454 12/18/19 1618 12/19/19 0419 12/19/19 1831 12/20/19 0405 12/21/19 0819  AST 421*  --  104*  --  52*  --  38  --   --   --   --   --   --   ALT 268*  --  144*  --  106*  --  80*  --   --   --   --   --   --  ALKPHOS 63  --  71  --  66  --  140*  --   --   --   --   --   --   BILITOT 1.6*  --  1.5*  --  1.9*  --  1.0  --   --   --   --   --   --   PROT 6.1*  --  5.4*  --  5.5*  --  5.2*  --   --   --   --   --   --   ALBUMIN 3.4*   < > 2.8*   < > 2.5*   < > 2.4*   < > 2.6* 2.5* 2.5* 2.4* 2.0*   < > = values in this interval not displayed.   Recent Labs  Lab 12/15/19 0309 12/16/19 0900  LIPASE 2,210* 173*   Recent Labs  Lab 12/16/19 0900  AMMONIA 47*   Coagulation Profile: No results for input(s): INR, PROTIME in the last 168 hours. Cardiac Enzymes: No results for input(s): CKTOTAL, CKMB, CKMBINDEX, TROPONINI in the last 168 hours. BNP (last 3 results) Recent Labs    12/29/18 1531  PROBNP 26.0   HbA1C: No results for input(s): HGBA1C in the last 72 hours. CBG: Recent Labs  Lab 12/20/19 1602 12/20/19 2030 12/21/19 0015 12/21/19 0350 12/21/19 1217  GLUCAP 258* 243* 188* 189* 155*   Lipid Profile: No results for input(s): CHOL, HDL, LDLCALC, TRIG, CHOLHDL, LDLDIRECT in the last 72 hours. Thyroid Function Tests: No results for input(s): TSH, T4TOTAL, FREET4, T3FREE, THYROIDAB in the last 72 hours. Anemia Panel: No results for input(s): VITAMINB12, FOLATE, FERRITIN, TIBC, IRON, RETICCTPCT in the last 72 hours. Urine analysis:    Component Value Date/Time   COLORURINE AMBER (A) 12/15/2019 0920   APPEARANCEUR CLOUDY (A) 12/15/2019 0920   APPEARANCEUR Cloudy (A) 12/12/2019 1332   LABSPEC 1.035 (H) 12/15/2019 0920   PHURINE 5.0 12/15/2019 0920   GLUCOSEU 50 (A) 12/15/2019 0920   HGBUR LARGE (A) 12/15/2019 0920   BILIRUBINUR NEGATIVE 12/15/2019  0920   BILIRUBINUR Negative 12/12/2019 1332   KETONESUR 5 (A) 12/15/2019 0920   PROTEINUR 100 (A) 12/15/2019 0920   UROBILINOGEN 0.2 02/12/2018 0805   NITRITE NEGATIVE 12/15/2019 0920   LEUKOCYTESUR NEGATIVE 12/15/2019 0920   Sepsis Labs: @LABRCNTIP (procalcitonin:4,lacticidven:4)  ) Recent Results (from the past 240 hour(s))  Microscopic Examination     Status: Abnormal   Collection Time: 12/12/19  1:32 PM   URINE  Result Value Ref Range Status   WBC, UA >30W 0 - 5 /hpf Final   RBC None seen 0 - 2 /hpf Final   Epithelial Cells (non renal) 0-10 0 - 10 /hpf Final   Renal Epithel, UA 0-10 (A) None seen /hpf Final   Bacteria, UA Few None seen/Few Final  CULTURE, URINE COMPREHENSIVE     Status: Abnormal   Collection Time: 12/12/19  1:52 PM   Specimen: Urine   UR  Result Value Ref Range Status   Urine Culture, Comprehensive Final report (A)  Final   Organism ID, Bacteria Enterococcus faecalis (A)  Final    Comment: Greater than 100,000 colony forming units per mL   ANTIMICROBIAL SUSCEPTIBILITY Comment  Final    Comment:       ** S = Susceptible; I = Intermediate; R = Resistant **                    P = Positive; N =  Negative             MICS are expressed in micrograms per mL    Antibiotic                 RSLT#1    RSLT#2    RSLT#3    RSLT#4 Ciprofloxacin                  S Levofloxacin                   S Nitrofurantoin                 S Penicillin                     S Tetracycline                   R Vancomycin                     S   Blood culture (routine x 2)     Status: None   Collection Time: 12/13/19  3:04 PM   Specimen: BLOOD  Result Value Ref Range Status   Specimen Description BLOOD RIGHT WRIST  Final   Special Requests   Final    BOTTLES DRAWN AEROBIC AND ANAEROBIC Blood Culture results may not be optimal due to an inadequate volume of blood received in culture bottles   Culture   Final    NO GROWTH 5 DAYS Performed at [facility], [address] [place], [place] [address]    Report Status 12/18/2019 FINAL  Final  Blood culture (routine x 2)     Status: None   Collection Time: 12/13/19  3:04 PM   Specimen: BLOOD  Result Value Ref Range Status   Specimen Description BLOOD LEFT HAND  Final   Special Requests   Final    BOTTLES DRAWN AEROBIC AND ANAEROBIC Blood Culture results may not be optimal due to an inadequate volume of blood received in culture bottles   Culture   Final    NO GROWTH 5 DAYS Performed at [facility], [address] [place], [place] [address]    Report Status 12/18/2019 FINAL  Final  Respiratory Panel by RT PCR (Flu A&B, Covid) - Nasopharyngeal Swab     Status: None   Collection Time: 12/13/19  3:04 PM   Specimen: Nasopharyngeal Swab  Result Value Ref Range Status   SARS Coronavirus 2 by RT PCR NEGATIVE NEGATIVE Final    Comment: (NOTE) SARS-CoV-2 target nucleic acids are NOT DETECTED. The SARS-CoV-2 RNA is generally detectable in upper respiratoy specimens during the acute phase of infection. The lowest concentration of SARS-CoV-2 viral copies this assay can detect is 131 copies/mL. A negative result does not preclude SARS-Cov-2 infection and should not be used as the sole basis for treatment or other patient management decisions. A negative result may occur with  improper specimen collection/handling, submission of specimen other than nasopharyngeal swab, presence of viral mutation(s) within the areas targeted by this assay, and inadequate number of viral copies (<131 copies/mL). A negative result must be combined with clinical observations, patient history, and epidemiological information. The expected result is Negative. Fact Sheet for Patients:  PinkCheek.be Fact Sheet for Healthcare Providers:  GravelBags.it This test is not yet ap proved or cleared by the [place] FDA and  has been authorized for detection and/or diagnosis  of SARS-CoV-2 by FDA under an  Emergency Use Authorization (EUA). This EUA will remain  in effect (meaning this test can be used) for the duration of the COVID-19 declaration under Section 564(b)(1) of the Act, 21 U.S.C. section 360bbb-3(b)(1), unless the authorization is terminated or revoked sooner.    Influenza A by PCR NEGATIVE NEGATIVE Final   Influenza B by PCR NEGATIVE NEGATIVE Final    Comment: (NOTE) The Xpert Xpress SARS-CoV-2/FLU/RSV assay is intended as an aid in  the diagnosis of influenza from Nasopharyngeal swab specimens and  should not be used as a sole basis for treatment. Nasal washings and  aspirates are unacceptable for Xpert Xpress SARS-CoV-2/FLU/RSV  testing. Fact Sheet for Patients: PinkCheek.be Fact Sheet for Healthcare Providers: GravelBags.it This test is not yet approved or cleared by the [place] FDA and  has been authorized for detection and/or diagnosis of SARS-CoV-2 by  FDA under an Emergency Use Authorization (EUA). This EUA will remain  in effect (meaning this test can be used) for the duration of the  Covid-19 declaration under Section 564(b)(1) of the Act, 21  U.S.C. section 360bbb-3(b)(1), unless the authorization is  terminated or revoked. Performed at [facility], [address] [place], [place] [address]   Culture, blood (x 2)     Status: None   Collection Time: 12/13/19 10:30 PM   Specimen: BLOOD  Result Value Ref Range Status   Specimen Description BLOOD LEFT ANTECUBITAL  Final   Special Requests   Final    BOTTLES DRAWN AEROBIC AND ANAEROBIC Blood Culture adequate volume   Culture   Final    NO GROWTH 5 DAYS Performed at [facility], [place] [address] [place], [place] [address]    Report Status 12/18/2019 FINAL  Final  Culture, blood (x 2)     Status: None   Collection Time: 12/13/19 10:39 PM   Specimen: BLOOD RIGHT HAND  Result Value Ref Range Status    Specimen Description BLOOD RIGHT HAND  Final   Special Requests   Final    BOTTLES DRAWN AEROBIC AND ANAEROBIC Blood Culture adequate volume   Culture   Final    NO GROWTH 5 DAYS Performed at [facility], [place] [address], [place], [place] [address]    Report Status 12/18/2019 FINAL  Final  Urine Culture     Status: None   Collection Time: 12/15/19  9:20 AM   Specimen: Urine, Clean Catch  Result Value Ref Range Status   Specimen Description URINE, CLEAN CATCH  Final   Special Requests Normal  Final   Culture   Final    NO GROWTH Performed at [facility], [place] [address], [place],  [address]    Report Status 12/16/2019 FINAL  Final  MRSA PCR Screening     Status: None   Collection Time: 12/15/19  6:14 PM   Specimen: Nasal Mucosa; Nasopharyngeal  Result Value Ref Range Status   MRSA by PCR NEGATIVE NEGATIVE Final    Comment:        The GeneXpert MRSA Assay (FDA approved for NASAL specimens only), is one component of a comprehensive MRSA colonization surveillance program. It is not intended to diagnose MRSA infection nor to guide or monitor treatment for MRSA infections. Performed at [facility], [place] [address], [place],  [address]       Studies: No results found.  Scheduled Meds: . amiodarone  200 mg Oral Daily  . B-complex with vitamin C  1 tablet Per  Tube Daily  . Chlorhexidine Gluconate Cloth  6 each Topical Daily  . feeding supplement  1 Container Oral TID BM  . insulin aspart  0-9 Units Subcutaneous Q4H  . insulin aspart  4 Units Subcutaneous Q4H  . insulin glargine  14 Units Subcutaneous Daily  . pantoprazole (PROTONIX) IV  40 mg Intravenous Q12H   Followed by  . [START ON 12/22/2019] pantoprazole  40 mg Oral BID AC  . sodium chloride flush  10-40 mL Intracatheter Q12H  . sucralfate  1 g Per Tube Q6H    Continuous Infusions: . sodium chloride 20 mL/hr at 12/19/19 1241     LOS: 8 days     Alma Friendly, MD [facility]  If 7PM-7AM, please contact night-coverage [email or web address] 12/21/2019, 4:05 PM

## 2019-12-21 NOTE — Progress Notes (Signed)
Reviewed consult for PIV and spoke with RN regarding needs. Pt has [place] with pigtail that is saline locked. Discussed with RN use of pigtail for IV fluids; RN in agreement. PIV not needed at this time.

## 2019-12-21 NOTE — Progress Notes (Signed)
Patient ID: Najia Hurlbutt, female   DOB: December 10, 1945, 74 y.o.   MRN: [id number] S: Complained of abdominal pain this morning O:BP (!) 111/49   Pulse 99   Temp 97.8 F (36.6 C) (Oral)   Resp 18   Ht 5' 0.98" (1.549 m)   Wt 82 kg   SpO2 98%   BMI 34.18 kg/m   Intake/Output Summary (Last 24 hours) at 12/21/2019 0845 Last data filed at 12/21/2019 0600 Gross per 24 hour  Intake 205.2 ml  Output 75 ml  Net 130.2 ml   Intake/Output: I/O last 3 completed shifts: In: 725.2 [P.O.:480; I.V.:185.2; NG/GT:60] Out: 91 [Urine:91]  Intake/Output this shift:  No intake/output data recorded. Weight change: 2.8 kg Gen: NAD CVS: no rub Resp: cta Abd: +BS, soft, mildly tender to palpation, no guarding or rebound Ext: minimal dependent edema  Recent Labs  Lab 12/15/19 0309 12/15/19 1500 12/16/19 0446 12/16/19 0954 12/17/19 0335 12/17/19 1630 12/18/19 0454 12/18/19 1618 12/19/19 0419 12/19/19 1831 12/20/19 0405  NA 138   < > 139   < > 139 138 138 136 135 136 135  K 5.7*   < > 4.3   < > 4.3 4.2 3.6 3.9 4.2 3.7 4.1  CL 103   < > 101   < > 99 98 101 101 100 100 100  CO2 21*   < > 22   < > 24 20* 24 24 22 23  18*  GLUCOSE 183*   < > 142*   < > 177* 268* 248* 221* 285* 210* 227*  BUN 47*   < > 31*   < > 24* 27* 27* 25* 33* 50* 56*  CREATININE 4.29*   < > 3.10*   < > 2.55* 2.72* 2.45* 2.07* 2.40* 3.72* 4.56*  ALBUMIN 3.4*  --  2.8*   < > 2.5* 2.8* 2.4* 2.6* 2.5* 2.5* 2.4*  CALCIUM 8.6*   < > 7.8*   < > 8.0* 8.3* 8.2* 7.9* 7.7* 7.7* 7.7*  PHOS  --   --  3.4   < > 2.4* 2.2* 2.2* 2.3* 2.7 2.9 3.5  AST 421*  --  104*  --  52*  --  38  --   --   --   --   ALT 268*  --  144*  --  106*  --  80*  --   --   --   --    < > = values in this interval not displayed.   Liver Function Tests: Recent Labs  Lab 12/16/19 0446 12/16/19 1655 12/17/19 0335 12/17/19 1630 12/18/19 0454 12/18/19 1618 12/19/19 0419 12/19/19 1831 12/20/19 0405  AST 104*  --  52*  --  38  --   --   --   --    ALT 144*  --  106*  --  80*  --   --   --   --   ALKPHOS 71  --  66  --  140*  --   --   --   --   BILITOT 1.5*  --  1.9*  --  1.0  --   --   --   --   PROT 5.4*  --  5.5*  --  5.2*  --   --   --   --   ALBUMIN 2.8*   < > 2.5*   < > 2.4*   < > 2.5* 2.5* 2.4*   < > = values in this interval  not displayed.   Recent Labs  Lab 12/15/19 0309 12/16/19 0900  LIPASE 2,210* 173*   Recent Labs  Lab 12/16/19 0900  AMMONIA 47*   CBC: Recent Labs  Lab 12/17/19 0335 12/17/19 0335 12/18/19 0454 12/18/19 0454 12/18/19 2211 12/18/19 2211 12/19/19 0419 12/19/19 1244 12/20/19 0405  WBC 14.9*   < > 20.5*   < > 39.0*  --  32.4*  --  28.0*  NEUTROABS  --   --  14.8*  --   --   --   --   --   --   HGB 8.8*   < > 8.0*   < > 6.5*   < > 7.5* 9.2* 8.6*  HCT 27.7*   < > 25.3*   < > 21.0*   < > 23.3* 27.9* 26.7*  MCV 87.4  --  87.8  --  88.2  --  87.6  --  89.3  PLT 126*   < > 165   < > 225  --  145*  --  114*   < > = values in this interval not displayed.   Cardiac Enzymes: No results for input(s): CKTOTAL, CKMB, CKMBINDEX, TROPONINI in the last 168 hours. CBG: Recent Labs  Lab 12/20/19 1143 12/20/19 1602 12/20/19 2030 12/21/19 0015 12/21/19 0350  GLUCAP 280* 258* 243* 188* 189*    Iron Studies: No results for input(s): IRON, TIBC, TRANSFERRIN, FERRITIN in the last 72 hours. Studies/Results: No results found. Marland Kitchen amiodarone  200 mg Oral Daily  . B-complex with vitamin C  1 tablet Per Tube Daily  . Chlorhexidine Gluconate Cloth  6 each Topical Daily  . feeding supplement  1 Container Oral TID BM  . fentaNYL      . insulin aspart  0-9 Units Subcutaneous Q4H  . insulin aspart  4 Units Subcutaneous Q4H  . insulin glargine  14 Units Subcutaneous Daily  . pantoprazole (PROTONIX) IV  40 mg Intravenous Q12H   Followed by  . [START ON 12/22/2019] pantoprazole  40 mg Oral BID AC  . sodium chloride flush  10-40 mL Intracatheter Q12H  . sucralfate  1 g Per Tube Q6H    BMET    Component  Value Date/Time   NA 135 12/20/2019 0405   K 4.1 12/20/2019 0405   CL 100 12/20/2019 0405   CO2 18 (L) 12/20/2019 0405   GLUCOSE 227 (H) 12/20/2019 0405   BUN 56 (H) 12/20/2019 0405   CREATININE 4.56 (H) 12/20/2019 0405   CREATININE 1.03 10/20/2013 1751   CALCIUM 7.7 (L) 12/20/2019 0405   GFRNONAA 9 (L) 12/20/2019 0405   GFRAA 10 (L) 12/20/2019 0405   CBC    Component Value Date/Time   WBC 28.0 (H) 12/20/2019 0405   RBC 2.99 (L) 12/20/2019 0405   HGB 8.6 (L) 12/20/2019 0405   HCT 26.7 (L) 12/20/2019 0405   PLT 114 (L) 12/20/2019 0405   MCV 89.3 12/20/2019 0405   MCH 28.8 12/20/2019 0405   MCHC 32.2 12/20/2019 0405   RDW 15.8 (H) 12/20/2019 0405   LYMPHSABS 1.4 12/18/2019 0454   MONOABS 2.3 (H) 12/18/2019 0454   EOSABS 0.0 12/18/2019 0454   BASOSABS 0.1 12/18/2019 0454     Assessment/Plan:  1. AKI- in setting of sepsis and severe acute pancreatitis +/- IV contrast exposure.   CRRT started on 12/15/19.  Stopped 12/18/19.  Remains oliguric/anuric.   1. Currently on HD this morning and tolerating it well except poorly functioning trialysis catheter.  bfr decreased  to 200. 2. Will continue to follow for renal recovery. 2. Acute gallstone pacreatitis- s/p ERCP with biliary stent placement.  On liquid diet 3. Anemia of critical illness and GIB- hgb improved after transfusion.  Continue to follow. 4. Atrial fibrillation with RVR- on amiodarone 5.  Leukocytosis- slowly improving.   Donetta Potts, MD [employer] [phone]

## 2019-12-21 NOTE — Progress Notes (Signed)
UNASSIGNED PATIENT Subjective: Patient seems to be doing better today from a GI standpoint. She denies having an abdominal pain, nausea or GI bleeding. Has Core trck in place. Tolerating chips and sips well.   Objective: Vital signs in last 24 hours: Temp:  [97.6 F (36.4 C)-98.9 F (37.2 C)] 98.3 F (36.8 C) (02/17 1212) Pulse Rate:  [92-106] 106 (02/17 1212) Resp:  [17-27] 20 (02/17 1212) BP: (109-158)/(47-68) 129/59 (02/17 1212) SpO2:  [94 %-98 %] 96 % (02/17 1212) Weight:  [81.3 kg-82.4 kg] 81.3 kg (02/17 1045) Last BM Date: 12/20/19  Intake/Output from previous day: 02/16 0701 - 02/17 0700 In: 238.2 [P.O.:120; I.V.:58.2; NG/GT:60] Out: 75 [Urine:75] Intake/Output this shift: Total I/O In: -  Out: 1000 [Other:1000]  General appearance: cooperative, appears stated age, fatigued, no distress, morbidly obese and pale Resp: clear to auscultation bilaterally Cardio: regular rate and rhythm, S1, S2 normal, no murmur, click, rub or gallop GI: soft, non-tender; bowel sounds normal; no masses,  no organomegaly Extremities: extremities normal, atraumatic, no cyanosis or edema  Lab Results: Recent Labs    12/19/19 0419 12/19/19 0419 12/19/19 1244 12/20/19 0405 12/21/19 0820  WBC 32.4*  --   --  28.0* 23.4*  HGB 7.5*   < > 9.2* 8.6* 8.5*  HCT 23.3*   < > 27.9* 26.7* 26.5*  PLT 145*  --   --  114* 105*   < > = values in this interval not displayed.   BMET Recent Labs    12/19/19 1831 12/20/19 0405 12/21/19 0819  NA 136 135 133*  K 3.7 4.1 4.1  CL 100 100 98  CO2 23 18* 18*  GLUCOSE 210* 227* 198*  BUN 50* 56* 73*  CREATININE 3.72* 4.56* 6.90*  CALCIUM 7.7* 7.7* 8.1*   LFT Recent Labs    12/21/19 0819  ALBUMIN 2.0*   PT/INR No results for input(s): LABPROT, INR in the last 72 hours. Hepatitis Panel Recent Labs    12/21/19 0818  HEPBSAG NON REACTIVE   Medications: I have reviewed the patient's current medications.  Assessment/Plan: 1) Gastric  ulcers/ABLA probably stress ulcers-seems to be stable. Core track can be removed and we can start on advancing her diet. 2) Gallstone pancreatitis-s/p ERCP. 3)  Atrial fibrillation with RVR. 4) Acute on CRF-had hemodialysis today-Creatinine 6.9. 5) Acute hypoxemia with respiratory failure.  6) Enterococcus UTI.   LOS: 8 days   [name] 12/21/2019, 1:15 PM

## 2019-12-22 LAB — CBC WITH DIFFERENTIAL/PLATELET
Abs Immature Granulocytes: 0.71 10*3/uL — ABNORMAL HIGH (ref 0.00–0.07)
Basophils Absolute: 0 10*3/uL (ref 0.0–0.1)
Basophils Relative: 0 %
Eosinophils Absolute: 0 10*3/uL (ref 0.0–0.5)
Eosinophils Relative: 0 %
HCT: 24.6 % — ABNORMAL LOW (ref 36.0–46.0)
Hemoglobin: 8.1 g/dL — ABNORMAL LOW (ref 12.0–15.0)
Immature Granulocytes: 4 %
Lymphocytes Relative: 5 %
Lymphs Abs: 0.8 10*3/uL (ref 0.7–4.0)
MCH: 28.8 pg (ref 26.0–34.0)
MCHC: 32.9 g/dL (ref 30.0–36.0)
MCV: 87.5 fL (ref 80.0–100.0)
Monocytes Absolute: 2.2 10*3/uL — ABNORMAL HIGH (ref 0.1–1.0)
Monocytes Relative: 13 %
Neutro Abs: 13.6 10*3/uL — ABNORMAL HIGH (ref 1.7–7.7)
Neutrophils Relative %: 78 %
Platelets: 110 10*3/uL — ABNORMAL LOW (ref 150–400)
RBC: 2.81 MIL/uL — ABNORMAL LOW (ref 3.87–5.11)
RDW: 16.7 % — ABNORMAL HIGH (ref 11.5–15.5)
WBC: 17.4 10*3/uL — ABNORMAL HIGH (ref 4.0–10.5)
nRBC: 0.2 % (ref 0.0–0.2)

## 2019-12-22 LAB — GLUCOSE, CAPILLARY
Glucose-Capillary: 150 mg/dL — ABNORMAL HIGH (ref 70–99)
Glucose-Capillary: 167 mg/dL — ABNORMAL HIGH (ref 70–99)
Glucose-Capillary: 176 mg/dL — ABNORMAL HIGH (ref 70–99)
Glucose-Capillary: 181 mg/dL — ABNORMAL HIGH (ref 70–99)
Glucose-Capillary: 197 mg/dL — ABNORMAL HIGH (ref 70–99)
Glucose-Capillary: 214 mg/dL — ABNORMAL HIGH (ref 70–99)
Glucose-Capillary: 220 mg/dL — ABNORMAL HIGH (ref 70–99)

## 2019-12-22 LAB — RENAL FUNCTION PANEL
Albumin: 2.1 g/dL — ABNORMAL LOW (ref 3.5–5.0)
Anion gap: 14 (ref 5–15)
BUN: 43 mg/dL — ABNORMAL HIGH (ref 8–23)
CO2: 21 mmol/L — ABNORMAL LOW (ref 22–32)
Calcium: 7.8 mg/dL — ABNORMAL LOW (ref 8.9–10.3)
Chloride: 97 mmol/L — ABNORMAL LOW (ref 98–111)
Creatinine, Ser: 5.55 mg/dL — ABNORMAL HIGH (ref 0.44–1.00)
GFR calc Af Amer: 8 mL/min — ABNORMAL LOW (ref >60)
GFR calc non Af Amer: 7 mL/min — ABNORMAL LOW (ref >60)
Glucose, Bld: 151 mg/dL — ABNORMAL HIGH (ref 70–99)
Phosphorus: 5.6 mg/dL — ABNORMAL HIGH (ref 2.5–4.6)
Potassium: 3.7 mmol/L (ref 3.5–5.1)
Sodium: 132 mmol/L — ABNORMAL LOW (ref 135–145)

## 2019-12-22 MED ORDER — INSULIN ASPART 100 UNIT/ML ~~LOC~~ SOLN: 0.0000 [IU] | Freq: Every day | SUBCUTANEOUS | Status: DC

## 2019-12-22 MED ORDER — RENA-VITE PO TABS
1.0000 | ORAL_TABLET | Freq: Every day | ORAL | Status: DC
  Administered 2019-12-22 – 2020-01-02 (×12): 1 via ORAL
  Filled 2019-12-22 (×12): qty 1

## 2019-12-22 MED ORDER — INSULIN ASPART 100 UNIT/ML ~~LOC~~ SOLN
0.0000 [IU] | Freq: Three times a day (TID) | SUBCUTANEOUS | Status: DC
  Administered 2019-12-22: 2 [IU] via SUBCUTANEOUS
  Administered 2019-12-23: 1 [IU] via SUBCUTANEOUS
  Administered 2019-12-24: 3 [IU] via SUBCUTANEOUS
  Administered 2019-12-24: 2 [IU] via SUBCUTANEOUS
  Administered 2019-12-24: 3 [IU] via SUBCUTANEOUS
  Administered 2019-12-25 (×2): 1 [IU] via SUBCUTANEOUS
  Administered 2019-12-26: 2 [IU] via SUBCUTANEOUS
  Administered 2019-12-27: 1 [IU] via SUBCUTANEOUS
  Administered 2019-12-27: 2 [IU] via SUBCUTANEOUS
  Administered 2019-12-27 – 2019-12-28 (×2): 1 [IU] via SUBCUTANEOUS
  Administered 2019-12-28: 2 [IU] via SUBCUTANEOUS
  Administered 2019-12-29: 1 [IU] via SUBCUTANEOUS
  Administered 2019-12-29: 2 [IU] via SUBCUTANEOUS
  Administered 2019-12-29: 3 [IU] via SUBCUTANEOUS
  Administered 2019-12-30: 1 [IU] via SUBCUTANEOUS
  Administered 2019-12-30: 2 [IU] via SUBCUTANEOUS
  Administered 2019-12-30: 1 [IU] via SUBCUTANEOUS
  Administered 2019-12-31: 3 [IU] via SUBCUTANEOUS
  Administered 2019-12-31 – 2020-01-01 (×3): 2 [IU] via SUBCUTANEOUS
  Administered 2020-01-01: 1 [IU] via SUBCUTANEOUS
  Administered 2020-01-01: 2 [IU] via SUBCUTANEOUS
  Administered 2020-01-02: 1 [IU] via SUBCUTANEOUS
  Administered 2020-01-02 – 2020-01-03 (×3): 2 [IU] via SUBCUTANEOUS
  Administered 2020-01-03: 1 [IU] via SUBCUTANEOUS
  Administered 2020-01-03: 2 [IU] via SUBCUTANEOUS

## 2019-12-22 MED ORDER — PRO-STAT SUGAR FREE PO LIQD
30.0000 mL | Freq: Two times a day (BID) | ORAL | Status: DC
  Administered 2019-12-22 – 2019-12-26 (×5): 30 mL via ORAL
  Filled 2019-12-22 (×10): qty 30

## 2019-12-22 MED ORDER — SENNOSIDES-DOCUSATE SODIUM 8.6-50 MG PO TABS
1.0000 | ORAL_TABLET | Freq: Two times a day (BID) | ORAL | Status: DC
  Administered 2019-12-22 – 2019-12-28 (×9): 1 via ORAL
  Filled 2019-12-22 (×12): qty 1

## 2019-12-22 MED ORDER — SUCRALFATE 1 GM/10ML PO SUSP
1.0000 g | Freq: Four times a day (QID) | ORAL | Status: DC
  Administered 2019-12-22 – 2020-01-03 (×40): 1 g via ORAL
  Filled 2019-12-22 (×42): qty 10

## 2019-12-22 MED ORDER — POLYETHYLENE GLYCOL 3350 17 G PO PACK
17.0000 g | PACK | Freq: Every day | ORAL | Status: DC
  Administered 2019-12-22 – 2019-12-28 (×4): 17 g via ORAL
  Filled 2019-12-22 (×5): qty 1

## 2019-12-22 MED ORDER — HYDROCODONE-ACETAMINOPHEN 5-325 MG PO TABS
1.0000 | ORAL_TABLET | ORAL | Status: DC | PRN
  Administered 2019-12-23 – 2019-12-26 (×11): 2 via ORAL
  Administered 2019-12-27 – 2019-12-29 (×3): 1 via ORAL
  Administered 2019-12-29 – 2019-12-30 (×3): 2 via ORAL
  Administered 2019-12-31 – 2020-01-02 (×2): 1 via ORAL
  Filled 2019-12-22 (×6): qty 2
  Filled 2019-12-22: qty 1
  Filled 2019-12-22 (×2): qty 2
  Filled 2019-12-22 (×2): qty 1
  Filled 2019-12-22 (×4): qty 2
  Filled 2019-12-22: qty 1
  Filled 2019-12-22 (×3): qty 2
  Filled 2019-12-22: qty 1

## 2019-12-22 NOTE — Plan of Care (Signed)

## 2019-12-22 NOTE — Evaluation (Signed)
Physical Therapy Evaluation Patient Details Name: Katherine Oliver MRN: [id number] DOB: [birth year] Today's Date: 12/22/2019   History of Present Illness  74 yo admitted 2/9 with pancreatitis and UTI s/p ERCP with Afib 2/12. PMHx: DM, HTN, CKD, obesity, sepsis, ARF, Rt TKA  Clinical Impression  Pt pleasant supine on arrival and reports generalized fatigue. Pt with sats 90% on RA with drop to 87% with gait and return to 2L with sats 94%. Pt with decreased activity tolerance, gait and mobility who will benefit from acute therapy to maximize strength, function and independence. Pt educated for IS use and performed x 10 with max 500cc achieved and pt educated for continued need to perform and encouraged daily mobility with nursing staff.      Follow Up Recommendations Home health PT;Supervision for mobility/OOB    Equipment Recommendations  None recommended by PT    Recommendations for Other Services       Precautions / Restrictions Precautions Precautions: Fall      Mobility  Bed Mobility Overal bed mobility: Needs Assistance Bed Mobility: Supine to Sit     Supine to sit: Min guard     General bed mobility comments: increased time with guarding for lines and safety  Transfers Overall transfer level: Needs assistance   Transfers: Sit to/from Stand Sit to Stand: Min guard         General transfer comment: cues for hand placement  Ambulation/Gait Ambulation/Gait assistance: Min guard Gait Distance (Feet): 80 Feet Assistive device: Rolling walker (2 wheeled) Gait Pattern/deviations: Step-through pattern;Decreased stride length   Gait velocity interpretation: 1.31 - 2.62 ft/sec, indicative of limited community ambulator General Gait Details: cues for posture and sequence, limited by fatigue  Stairs            Wheelchair Mobility    Modified Rankin (Stroke Patients Only)       Balance Overall balance assessment: Needs assistance   Sitting  balance-Leahy Scale: Good       Standing balance-Leahy Scale: Fair Standing balance comment: pt able to stand without UE support, reliance on RW for gait                             Pertinent Vitals/Pain Pain Assessment: 0-10 Pain Score: 6  Pain Location: left flank Pain Descriptors / Indicators: Aching Pain Intervention(s): Limited activity within patient's tolerance;Repositioned;Premedicated before session    Home Living Family/patient expects to be discharged to:: Private residence Living Arrangements: Spouse/significant other Available Help at Discharge: Family;Available 24 hours/day Type of Home: House Home Access: Stairs to enter   [employer] of Steps: 1 Home Layout: Two level;Able to live on main level with bedroom/bathroom Home Equipment: Gilford Rile - 2 wheels;Cane - single point;Tub bench;Toilet riser      Prior Function Level of Independence: Independent               Hand Dominance        Extremity/Trunk Assessment   Upper Extremity Assessment Upper Extremity Assessment: Overall WFL for tasks assessed    Lower Extremity Assessment Lower Extremity Assessment: Overall WFL for tasks assessed    Cervical / Trunk Assessment Cervical / Trunk Assessment: Kyphotic  Communication   Communication: No difficulties  Cognition Arousal/Alertness: Awake/alert Behavior During Therapy: WFL for tasks assessed/performed Overall Cognitive Status: Within Functional Limits for tasks assessed  General Comments      Exercises General Exercises - Lower Extremity Long Arc Quad: AROM;Both;Seated;10 reps Hip Flexion/Marching: AROM;Both;10 reps;Seated   Assessment/Plan    PT Assessment Patient needs continued PT services  PT Problem List Decreased strength;Decreased mobility;Decreased activity tolerance;Decreased balance;Decreased knowledge of use of DME;Cardiopulmonary status limiting  activity       PT Treatment Interventions DME instruction;Therapeutic exercise;Gait training;Balance training;Functional mobility training;Therapeutic activities;Patient/family education;Stair training    PT Goals (Current goals can be found in the Care Plan section)  Acute Rehab PT Goals Patient Stated Goal: return home PT Goal Formulation: With patient Time For Goal Achievement: 01/05/20 Potential to Achieve Goals: Good    Frequency Min 3X/week   Barriers to discharge Decreased caregiver support      Co-evaluation               AM-PAC PT "6 Clicks" Mobility  Outcome Measure Help needed turning from your back to your side while in a flat bed without using bedrails?: A Little Help needed moving from lying on your back to sitting on the side of a flat bed without using bedrails?: A Little Help needed moving to and from a bed to a chair (including a wheelchair)?: A Little Help needed standing up from a chair using your arms (e.g., wheelchair or bedside chair)?: A Little Help needed to walk in hospital room?: A Little Help needed climbing 3-5 steps with a railing? : A Little 6 Click Score: 18    End of Session Equipment Utilized During Treatment: Gait belt Activity Tolerance: Patient tolerated treatment well Patient left: in chair;with call bell/phone within reach;with chair alarm set Nurse Communication: Mobility status PT Visit Diagnosis: Other abnormalities of gait and mobility (R26.89)    Time: 9937-1696 PT Time Calculation (min) (ACUTE ONLY): 23 min   Charges:   PT Evaluation $PT Eval Moderate Complexity: 1 Mod PT Treatments $Therapeutic Activity: 8-22 mins        Patterson Hollenbaugh P, PT Acute Rehabilitation Services Pager: [phone] Office: [place] B Katherine Oliver 12/22/2019, 10:47 AM

## 2019-12-22 NOTE — Progress Notes (Signed)
Removed cortrak per order found in MD's notes and patient tolerated it well and also weaned patient from 1L of 02 to room air, saturating now at 92-94% RA, no distress noted and will continue to monitor patient with remainder of shift.

## 2019-12-22 NOTE — Progress Notes (Signed)
Patient back on O2 via nasal cannula @ 1L/min because patient desaturates back to 88% when asleep, will try to wean again this morning, will endorse this to day nurse.

## 2019-12-22 NOTE — Progress Notes (Signed)
Subjective: Some nausea.  Objective: Vital signs in last 24 hours: Temp:  [97.9 F (36.6 C)-98 F (36.7 C)] 97.9 F (36.6 C) (02/18 0354) Pulse Rate:  [95-99] 95 (02/18 0354) Resp:  [17-18] 17 (02/18 0354) BP: (122-133)/(49-59) 122/59 (02/18 0354) SpO2:  [88 %-98 %] 92 % (02/18 0415) Last BM Date: 12/22/19  Intake/Output from previous day: 02/17 0701 - 02/18 0700 In: -  Out: 1050 [Urine:50] Intake/Output this shift: Total I/O In: 240 [P.O.:240] Out: -   General appearance: alert and no distress GI: soft, non-tender; bowel sounds normal; no masses,  no organomegaly  Lab Results: Recent Labs    12/20/19 0405 12/21/19 0820 12/22/19 0216  WBC 28.0* 23.4* 17.4*  HGB 8.6* 8.5* 8.1*  HCT 26.7* 26.5* 24.6*  PLT 114* 105* 110*   BMET Recent Labs    12/20/19 0405 12/21/19 0819 12/22/19 0216  NA 135 133* 132*  K 4.1 4.1 3.7  CL 100 98 97*  CO2 18* 18* 21*  GLUCOSE 227* 198* 151*  BUN 56* 73* 43*  CREATININE 4.56* 6.90* 5.55*  CALCIUM 7.7* 8.1* 7.8*   LFT Recent Labs    12/22/19 0216  ALBUMIN 2.1*   PT/INR No results for input(s): LABPROT, INR in the last 72 hours. Hepatitis Panel Recent Labs    12/21/19 0818  HEPBSAG NON REACTIVE   C-Diff No results for input(s): CDIFFTOX in the last 72 hours. Fecal Lactopherrin No results for input(s): FECLLACTOFRN in the last 72 hours.  Studies/Results: No results found.  Medications:  Scheduled: . amiodarone  200 mg Oral Daily  . Chlorhexidine Gluconate Cloth  6 each Topical Daily  . feeding supplement (PRO-STAT SUGAR FREE 64)  30 mL Oral BID  . insulin aspart  0-9 Units Subcutaneous Q4H  . insulin aspart  4 Units Subcutaneous Q4H  . insulin glargine  14 Units Subcutaneous Daily  . multivitamin  1 tablet Oral QHS  . pantoprazole  40 mg Oral BID AC  . sodium chloride flush  10-40 mL Intracatheter Q12H  . sucralfate  1 g Per Tube Q6H   Continuous: . sodium chloride 20 mL/hr at 12/19/19 1241     Assessment/Plan: 1) Severe acute gallstone pancreatitis. 2) ARF - multifactorial.   She is making progress.  She has some nausea today, but other wise she was tolerating her PO without difficulty.    Plan: 1) Continue supportive care. 2) Advance diet as tolerated. 3) Upon discharge she will need to follow up in the office to address her stent and repeat ERCP.  LOS: 9 days   Katherine Oliver D 12/22/2019, 1:26 PM

## 2019-12-22 NOTE — Progress Notes (Signed)
Nutrition Follow-up  RD working remotely.  DOCUMENTATION CODES:   Not applicable  INTERVENTION:   -D/c Boost Breeze due to poor acceptance -Renal MVI daily -30 ml Prostat BID, each supplement provides 100 kcals and 15 grams protein -RD will follow for diet advancement and adjust supplement regimen as appropriate; recommending advancing diet as soon as medically able as pt will be unable to meet nutritional needs on limitations of clear liquid diet  NUTRITION DIAGNOSIS:   Inadequate oral intake related to altered GI function, acute illness as evidenced by NPO status.  Progressing; advanced to clear liquid diet and cortrak removed  GOAL:   Patient will meet greater than or equal to 90% of their needs  Progressing   MONITOR:   Diet advancement, TF tolerance, Skin, Labs, Weight trends  REASON FOR ASSESSMENT:   Rounds    ASSESSMENT:   74 yo female admitted with severe sepsis with multi-orgran failure secondary to acute gallstone pancreatitis. PMH includes HTN, DM, CKD III, GERD, hepatic steatosis  2/09 Admitted 2/10 ERCP with stent placement 2/11 Transfer to ICU, CRRT initiated 2/12 Cortrak placed (tip at LOT), TF initiated 2/14 CRRT stopped 2/15 EGD: grade II distal esophagitis, multiple gastric ulcers 2/16 TF d/c, diet advanced; transferred out of ICU to floor 2/18- cortrak tube removed  Reviewed I/O's: -1.1 L x 24 hours and +725 ml since admission  UOP: 50 ml x 24 hours  Per nephrology notes, tolerating HD well (last treatment 12/21/19), however, pt with poorly functioning trialysis catheter.   Attempted to speak with pt via phone, however, no answer. She remains in a clear liquid diet and complaining of abdominal pain yesterday. She has minimal intake of clear liquids (PO: 25%). She is refusing Boost Breeze supplements. Per GI notes, plan to advance diet upon removal of cortrak tube.   Labs reviewed: Na: 132, Phos: 5.6, CBGS: 136-168(inpatient orders for  glycemic control are 0-9 units insulin aspart every 4 hours, 4 units insulin aspart every 4 hours, and 14 units insulin glargine daily).   Diet Order:   Diet Order            Diet clear liquid Room service appropriate? Yes; Fluid consistency: Thin  Diet effective now              EDUCATION NEEDS:   Not appropriate for education at this time  Skin:  Skin Assessment: Skin Integrity Issues: Skin Integrity Issues:: Other (Comment) Other: MASD sacrum and perineum  Last BM:  12/22/19  Height:   Ht Readings from Last 1 Encounters:  12/13/19 5' 0.98" (1.549 m)    Weight:   Wt Readings from Last 1 Encounters:  12/21/19 81.3 kg   BMI:  Body mass index is 33.88 kg/m.  Estimated Nutritional Needs:   Kcal:  1700-1900 kcals  Protein:  90-105 g  Fluid:  >/= 1.8 L    Loistine Chance, RD, LDN, [place] Registered Dietitian II Certified Diabetes Care and Education Specialist Please refer to [facility] for RD and/or RD on-call/weekend/after hours pager

## 2019-12-22 NOTE — Progress Notes (Signed)
Patient ID: Brittani Purdum, female   DOB: 04/16/1946, 74 y.o.   MRN: [id number] S: Still having some abdominal pain this morning but did tolerate HD well yesterday O:BP (!) 122/59 (BP Location: Right Arm)   Pulse 95   Temp 97.9 F (36.6 C) (Oral)   Resp 17   Ht 5' 0.98" (1.549 m)   Wt 81.3 kg   SpO2 92%   BMI 33.88 kg/m   Intake/Output Summary (Last 24 hours) at 12/22/2019 1109 Last data filed at 12/22/2019 0900 Gross per 24 hour  Intake 240 ml  Output 50 ml  Net 190 ml   Intake/Output: I/O last 3 completed shifts: In: 60 [NG/GT:60] Out: 1125 [Urine:125; Other:1000]  Intake/Output this shift:  Total I/O In: 240 [P.O.:240] Out: -  Weight change: -0.4 kg Gen: NAD CVS: no rub Resp: cta Abd: +BS, soft, NT Ext: no edema  Recent Labs  Lab 12/16/19 0446 12/16/19 0954 12/17/19 0335 12/17/19 1630 12/18/19 0454 12/18/19 1618 12/19/19 0419 12/19/19 1831 12/20/19 0405 12/21/19 0819 12/22/19 0216  NA 139   < > 139   < > 138 136 135 136 135 133* 132*  K 4.3   < > 4.3   < > 3.6 3.9 4.2 3.7 4.1 4.1 3.7  CL 101   < > 99   < > 101 101 100 100 100 98 97*  CO2 22   < > 24   < > 24 24 22 23  18* 18* 21*  GLUCOSE 142*   < > 177*   < > 248* 221* 285* 210* 227* 198* 151*  BUN 31*   < > 24*   < > 27* 25* 33* 50* 56* 73* 43*  CREATININE 3.10*   < > 2.55*   < > 2.45* 2.07* 2.40* 3.72* 4.56* 6.90* 5.55*  ALBUMIN 2.8*   < > 2.5*   < > 2.4* 2.6* 2.5* 2.5* 2.4* 2.0* 2.1*  CALCIUM 7.8*   < > 8.0*   < > 8.2* 7.9* 7.7* 7.7* 7.7* 8.1* 7.8*  PHOS 3.4   < > 2.4*   < > 2.2* 2.3* 2.7 2.9 3.5 5.4* 5.6*  AST 104*  --  52*  --  38  --   --   --   --   --   --   ALT 144*  --  106*  --  80*  --   --   --   --   --   --    < > = values in this interval not displayed.   Liver Function Tests: Recent Labs  Lab 12/16/19 0446 12/16/19 1655 12/17/19 0335 12/17/19 1630 12/18/19 0454 12/18/19 1618 12/20/19 0405 12/21/19 0819 12/22/19 0216  AST 104*  --  52*  --  38  --   --   --   --    ALT 144*  --  106*  --  80*  --   --   --   --   ALKPHOS 71  --  66  --  140*  --   --   --   --   BILITOT 1.5*  --  1.9*  --  1.0  --   --   --   --   PROT 5.4*  --  5.5*  --  5.2*  --   --   --   --   ALBUMIN 2.8*   < > 2.5*   < > 2.4*   < > 2.4* 2.0*  2.1*   < > = values in this interval not displayed.   Recent Labs  Lab 12/16/19 0900  LIPASE 173*   Recent Labs  Lab 12/16/19 0900  AMMONIA 47*   CBC: Recent Labs  Lab 12/18/19 0454 12/18/19 0454 12/18/19 2211 12/18/19 2211 12/19/19 0419 12/19/19 1244 12/20/19 0405 12/21/19 0820 12/22/19 0216  WBC 20.5*   < > 39.0*   < > 32.4*  --  28.0* 23.4* 17.4*  NEUTROABS 14.8*  --   --   --   --   --   --   --  13.6*  HGB 8.0*   < > 6.5*   < > 7.5*   < > 8.6* 8.5* 8.1*  HCT 25.3*   < > 21.0*   < > 23.3*   < > 26.7* 26.5* 24.6*  MCV 87.8   < > 88.2  --  87.6  --  89.3 90.1 87.5  PLT 165   < > 225   < > 145*  --  114* 105* 110*   < > = values in this interval not displayed.   Cardiac Enzymes: No results for input(s): CKTOTAL, CKMB, CKMBINDEX, TROPONINI in the last 168 hours. CBG: Recent Labs  Lab 12/21/19 1713 12/21/19 2027 12/21/19 2316 12/22/19 0305 12/22/19 0804  GLUCAP 169* 168* 136* 150* 167*    Iron Studies: No results for input(s): IRON, TIBC, TRANSFERRIN, FERRITIN in the last 72 hours. Studies/Results: No results found. Marland Kitchen amiodarone  200 mg Oral Daily  . Chlorhexidine Gluconate Cloth  6 each Topical Daily  . feeding supplement (PRO-STAT SUGAR FREE 64)  30 mL Oral BID  . insulin aspart  0-9 Units Subcutaneous Q4H  . insulin aspart  4 Units Subcutaneous Q4H  . insulin glargine  14 Units Subcutaneous Daily  . multivitamin  1 tablet Oral QHS  . pantoprazole  40 mg Oral BID AC  . sodium chloride flush  10-40 mL Intracatheter Q12H  . sucralfate  1 g Per Tube Q6H    BMET    Component Value Date/Time   NA 132 (L) 12/22/2019 0216   K 3.7 12/22/2019 0216   CL 97 (L) 12/22/2019 0216   CO2 21 (L) 12/22/2019 0216    GLUCOSE 151 (H) 12/22/2019 0216   BUN 43 (H) 12/22/2019 0216   CREATININE 5.55 (H) 12/22/2019 0216   CREATININE 1.03 10/20/2013 1751   CALCIUM 7.8 (L) 12/22/2019 0216   GFRNONAA 7 (L) 12/22/2019 0216   GFRAA 8 (L) 12/22/2019 0216   CBC    Component Value Date/Time   WBC 17.4 (H) 12/22/2019 0216   RBC 2.81 (L) 12/22/2019 0216   HGB 8.1 (L) 12/22/2019 0216   HCT 24.6 (L) 12/22/2019 0216   PLT 110 (L) 12/22/2019 0216   MCV 87.5 12/22/2019 0216   MCH 28.8 12/22/2019 0216   MCHC 32.9 12/22/2019 0216   RDW 16.7 (H) 12/22/2019 0216   LYMPHSABS 0.8 12/22/2019 0216   MONOABS 2.2 (H) 12/22/2019 0216   EOSABS 0.0 12/22/2019 0216   BASOSABS 0.0 12/22/2019 0216     Assessment/Plan:  1. AKI- in setting of sepsis and severe acute pancreatitis +/- IV contrast exposure. CRRT started on 12/15/19. Stopped 12/18/19. Remains oliguric/anuric.  1. Had difficulty with HD 12/21/19 with poorly functioning trialysis catheter.  bfr decreased to 200. 2. Will continue to follow for renal recovery. 3. Plan for HD again tomorrow due to persistent oliguria. 4. May need new HD catheter if still not functioning well tomorrow.  2. Acute gallstone pacreatitis- s/p ERCP with biliary stent placement. On liquid diet 3. Anemia of critical illness and GIB- hgb improved after transfusion. Continue to follow. 4. Atrial fibrillation with RVR- on amiodarone 5. Leukocytosis- slowly improving.   Donetta Potts, MD [employer] [phone]

## 2019-12-22 NOTE — Evaluation (Signed)
Occupational Therapy Evaluation Patient Details Name: Katherine Oliver MRN: [id number] DOB: 1946/04/08 Today's Date: 12/22/2019    History of Present Illness 74 yo admitted 2/9 with pancreatitis and UTI s/p ERCP with Afib 2/12. PMHx: DM, HTN, CKD, obesity, sepsis, ARF, Rt TKA   Clinical Impression   Patient is a 74 year old female that lives with her spouse in two level home, can stay main level. Patient independent with ambulation and self care at baseline. Currently patient min guard for bed mobility, min A for functional transfers with cues for body mechanics/technique and mod A for LB dressing which patient states she would sometimes needs assist from spouse to doff/don R sock since R TKA last year. Recommend continued acute OT services to maximize patient safety and independence with self care.    Follow Up Recommendations  Home health OT;Supervision - Intermittent    Equipment Recommendations  3 in 1 bedside commode       Precautions / Restrictions Precautions Precautions: Fall Restrictions Weight Bearing Restrictions: No      Mobility Bed Mobility Overal bed mobility: Needs Assistance Bed Mobility: Sit to Supine;Rolling;Sidelying to Sit Rolling: Min guard Sidelying to sit: Min guard Supine to sit: Min guard Sit to supine: Min guard   General bed mobility comments: verbal cues for sequencing log rolling   Transfers Overall transfer level: Needs assistance Equipment used: Rolling walker (2 wheeled) Transfers: Sit to/from Stand Sit to Stand: Min guard;Min assist         General transfer comment: cues for hand placement    Balance Overall balance assessment: Needs assistance Sitting-balance support: No upper extremity supported;Feet supported Sitting balance-Leahy Scale: Good     Standing balance support: Bilateral upper extremity supported;During functional activity Standing balance-Leahy Scale: Poor Standing balance comment: pt able to stand  without UE support, reliance on RW for gait                           ADL either performed or assessed with clinical judgement   ADL Overall ADL's : Needs assistance/impaired Eating/Feeding: Independent;Sitting   Grooming: Set up;Sitting;Wash/dry face;Brushing hair   Upper Body Bathing: Set up;Sitting   Lower Body Bathing: Moderate assistance;Sitting/lateral leans;Sit to/from stand   Upper Body Dressing : Set up;Sitting   Lower Body Dressing: Moderate assistance;Sitting/lateral leans;Sit to/from stand Lower Body Dressing Details (indicate cue type and reason): pt able to doff/don L sock, unable R. reports spouse sometimes has to assist since R knee replacement Toilet Transfer: Minimal assistance;Ambulation;BSC;RW;Cueing for safety Toilet Transfer Details (indicate cue type and reason): cues for body mechanics         Functional mobility during ADLs: Minimal assistance;Cueing for safety;Rolling walker       Vision Baseline Vision/History: Wears glasses Wears Glasses: At all times              Pertinent Vitals/Pain Pain Assessment: 0-10 Pain Score: 8  Pain Location: left flank Pain Descriptors / Indicators: Aching Pain Intervention(s): RN gave pain meds during session     Hand Dominance Right   Extremity/Trunk Assessment Upper Extremity Assessment Upper Extremity Assessment: Overall WFL for tasks assessed   Lower Extremity Assessment Lower Extremity Assessment: Defer to PT evaluation   Cervical / Trunk Assessment Cervical / Trunk Assessment: Kyphotic   Communication Communication Communication: No difficulties   Cognition Arousal/Alertness: Awake/alert Behavior During Therapy: WFL for tasks assessed/performed Overall Cognitive Status: Within Functional Limits for tasks assessed  Home Living Family/patient expects to be discharged to:: Private residence Living Arrangements:  Spouse/significant other Available Help at Discharge: Family;Available 24 hours/day Type of Home: House Home Access: Stairs to enter [employer] of Steps: 1 Entrance Stairs-Rails: None Home Layout: Two level;Able to live on main level with bedroom/bathroom     Bathroom Shower/Tub: Teacher, early years/pre: Standard     Home Equipment: Environmental consultant - 2 wheels;Cane - single point;Tub bench;Toilet riser;Grab bars - toilet;Other (comment)(reacher, sock aid)          Prior Functioning/Environment Level of Independence: Independent                 OT Problem List: Decreased activity tolerance;Impaired balance (sitting and/or standing);Decreased safety awareness;Pain      OT Treatment/Interventions: Self-care/ADL training;Therapeutic exercise;DME and/or AE instruction;Therapeutic activities;Patient/family education;Balance training    OT Goals(Current goals can be found in the care plan section) Acute Rehab OT Goals Patient Stated Goal: return home OT Goal Formulation: With patient Time For Goal Achievement: 01/05/20 Potential to Achieve Goals: Good  OT Frequency: Min 2X/week    AM-PAC OT "6 Clicks" Daily Activity     Outcome Measure Help from another person eating meals?: None Help from another person taking care of personal grooming?: A Little Help from another person toileting, which includes using toliet, bedpan, or urinal?: A Little Help from another person bathing (including washing, rinsing, drying)?: A Lot Help from another person to put on and taking off regular upper body clothing?: A Little Help from another person to put on and taking off regular lower body clothing?: A Lot 6 Click Score: 17   End of Session Equipment Utilized During Treatment: Rolling walker;Oxygen Nurse Communication: Mobility status  Activity Tolerance: Patient tolerated treatment well Patient left: in bed;with call bell/phone within reach;with bed alarm set  OT Visit  Diagnosis: Other abnormalities of gait and mobility (R26.89);Pain Pain - Right/Left: Left Pain - part of body: (flank)                Time: 7035-0093 OT Time Calculation (min): 39 min Charges:  OT General Charges $OT Visit: 1 Visit OT Evaluation $OT Eval Moderate Complexity: 1 Mod OT Treatments $Self Care/Home Management : 23-37 mins  Shon Millet OT OT office: [place] 12/22/2019, 12:28 PM

## 2019-12-22 NOTE — Progress Notes (Signed)
PROGRESS NOTE  Alyene Predmore Verno PYK:[id number] DOB: 1946/01/18 DOA: 12/13/2019 PCP: [name], MD  HPI/Recap of past 24 hours: HPI from PCCM 74 y/o female admitted on 2/9 with gallstone pancreatitis and UTI.  Had ERCP on 2/10 with stent placement (technically difficult and complex), followed by worsening pancreatic pain, increased tachycardia, increased work of breathing and supplemental oxygen use, worsening AKI, PCCM consulted.  General surgery also consulted due to increased intrabladder pressure and concern for possible abdominal compartment syndrome.  On 2/12 patient went into A. fib with RVR, started on diltiazem, later became hypotensive and switched to amiodarone.  Patient also had an EGD done on 2/15 due to possible GI bleed.  EGD showed stress ulceration.  Due to some improvement in overall clinical status, [facility] assumed care on 12/21/2019.    Today, patient reported she had a bad night, reported abdominal pain, radiating to her back, with some nausea but denies any vomiting.  Today, saw patient this a.m., just received pain medications which helped her abdominal pain.  PT/OT in the room, to ambulate patient.      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, without long-term current use of insulin (HCC)   HTN (hypertension)   Dyslipidemia associated with type 2 diabetes mellitus (HCC)   NAFLD (nonalcoholic fatty liver disease)   GERD (gastroesophageal reflux disease)   Polycythemia   Obesity, Class I, BMI 30-34.9   CKD stage 3 due to type 2 diabetes mellitus ([place])   Acute gallstone pancreatitis   Sepsis ([place])   Acute renal failure (ARF) ([place])   Acute respiratory failure (HCC)   Choledocholithiasis   Gallstone pancreatitis post ERCP with biliary stent placement Acute hepatitis Acute blood loss anemia/GI bleeding 2/2 gastric/stress ulcers Hemoglobin currently stable status post 2 units of  PRBC LFTs improved GI on board, continue supportive care and advance diet as tolerated Continue PPI, Carafate Daily CBC  AKI with oliguria/anuric In the setting of sepsis and severe acute pancreatitis Status post CRRT, started on 2/11, stopped on 2/14 Currently on HD Nephrology on board  Leukocytosis Remains afebrile, hemodynamically stable No obvious signs of infection Continue to monitor off antibiotics Daily CBC  A. fib with RVR Possibly likely related to sepsis Currently rate controlled Currently on oral amiodarone, may discontinue on discharge if continues to remain sinus rhythm  Thrombocytopenia Monitor closely for any signs of bleeding Daily CBC  Acute hypoxemic and hypercarbic respiratory failure Chest x-ray showed atelectasis Currently on supplemental oxygen, plan to wean off Continue incentive spirometry  Enterococcus UTI Completed antibiotics  Diabetes mellitus type 2 SSI, Lantus, Accu-Cheks, hypoglycemic protocol  Obesity Advise lifestyle modification  Physical deconditioning PT/OT Out of bed as tolerated        Malnutrition Type:  Nutrition Problem: Inadequate oral intake Etiology: altered GI function, acute illness   Malnutrition Characteristics:  Signs/Symptoms: NPO status   Nutrition Interventions:  Interventions: Refer to RD note for recommendations    Estimated body mass index is 33.88 kg/m as calculated from the following:   Height as of this encounter: 5' 0.98" (1.549 m).   Weight as of this encounter: 81.3 kg.     Code Status: Full  Family Communication: None at bedside  Disposition Plan: Once significant clinical improvement, consultant signing off, pending PT/OT evaluation   Consultants:  PCCM  GI  Nephrology  Procedures:  ERCP on 2/10  Antimicrobials:  None  DVT prophylaxis: SCD, due to GI bleed   Objective:  Vitals:   12/22/19 0354 12/22/19 0414 12/22/19 0415 12/22/19 1401  BP: (!) 122/59    (!) 135/58  Pulse: 95   92  Resp: 17   18  Temp: 97.9 F (36.6 C)   97.8 F (36.6 C)  TempSrc: Oral   Oral  SpO2: 94% (!) 88% 92% 97%  Weight:      Height:        Intake/Output Summary (Last 24 hours) at 12/22/2019 1451 Last data filed at 12/22/2019 1447 Gross per 24 hour  Intake 240 ml  Output 51 ml  Net 189 ml   Filed Weights   12/21/19 0644 12/21/19 0740 12/21/19 1045  Weight: 82.4 kg 82 kg 81.3 kg    Exam:  General: NAD, deconditioned  Cardiovascular: S1, S2 present  Respiratory: CTAB  Abdomen: Soft, mildly tender, nondistended, bowel sounds present  Musculoskeletal: No bilateral pedal edema noted  Skin: Normal  Psychiatry: Normal mood   Data Reviewed: CBC: Recent Labs  Lab 12/18/19 0454 12/18/19 0454 12/18/19 2211 12/18/19 2211 12/19/19 0419 12/19/19 1244 12/20/19 0405 12/21/19 0820 12/22/19 0216  WBC 20.5*   < > 39.0*  --  32.4*  --  28.0* 23.4* 17.4*  NEUTROABS 14.8*  --   --   --   --   --   --   --  13.6*  HGB 8.0*   < > 6.5*   < > 7.5* 9.2* 8.6* 8.5* 8.1*  HCT 25.3*   < > 21.0*   < > 23.3* 27.9* 26.7* 26.5* 24.6*  MCV 87.8   < > 88.2  --  87.6  --  89.3 90.1 87.5  PLT 165   < > 225  --  145*  --  114* 105* 110*   < > = values in this interval not displayed.   Basic Metabolic Panel: Recent Labs  Lab 12/17/19 0335 12/17/19 1630 12/17/19 2308 12/18/19 0454 12/18/19 1618 12/19/19 0419 12/19/19 1831 12/20/19 0405 12/21/19 0819 12/22/19 0216  NA 139   < >  --  138   < > 135 136 135 133* 132*  K 4.3   < >  --  3.6   < > 4.2 3.7 4.1 4.1 3.7  CL 99   < >  --  101   < > 100 100 100 98 97*  CO2 24   < >  --  24   < > 22 23 18* 18* 21*  GLUCOSE 177*   < >  --  248*   < > 285* 210* 227* 198* 151*  BUN 24*   < >  --  27*   < > 33* 50* 56* 73* 43*  CREATININE 2.55*   < >  --  2.45*   < > 2.40* 3.72* 4.56* 6.90* 5.55*  CALCIUM 8.0*   < >  --  8.2*   < > 7.7* 7.7* 7.7* 8.1* 7.8*  MG 2.5*  --  2.8* 2.6*  --  2.4  --  2.4  --   --   PHOS 2.4*    < >  --  2.2*   < > 2.7 2.9 3.5 5.4* 5.6*   < > = values in this interval not displayed.   GFR: Estimated Creatinine Clearance: 8.5 mL/min (A) (by C-G formula based on SCr of 5.55 mg/dL (H)). Liver Function Tests: Recent Labs  Lab 12/16/19 0446 12/16/19 1655 12/17/19 0335 12/17/19 1630 12/18/19 0454 12/18/19 1618 12/19/19 0419 12/19/19 1831 12/20/19 0405 12/21/19  9381 12/22/19 0216  AST 104*  --  52*  --  38  --   --   --   --   --   --   ALT 144*  --  106*  --  80*  --   --   --   --   --   --   ALKPHOS 71  --  66  --  140*  --   --   --   --   --   --   BILITOT 1.5*  --  1.9*  --  1.0  --   --   --   --   --   --   PROT 5.4*  --  5.5*  --  5.2*  --   --   --   --   --   --   ALBUMIN 2.8*   < > 2.5*   < > 2.4*   < > 2.5* 2.5* 2.4* 2.0* 2.1*   < > = values in this interval not displayed.   Recent Labs  Lab 12/16/19 0900  LIPASE 173*   Recent Labs  Lab 12/16/19 0900  AMMONIA 47*   Coagulation Profile: No results for input(s): INR, PROTIME in the last 168 hours. Cardiac Enzymes: No results for input(s): CKTOTAL, CKMB, CKMBINDEX, TROPONINI in the last 168 hours. BNP (last 3 results) Recent Labs    12/29/18 1531  PROBNP 26.0   HbA1C: No results for input(s): HGBA1C in the last 72 hours. CBG: Recent Labs  Lab 12/21/19 2027 12/21/19 2316 12/22/19 0305 12/22/19 0804 12/22/19 1225  GLUCAP 168* 136* 150* 167* 176*   Lipid Profile: No results for input(s): CHOL, HDL, LDLCALC, TRIG, CHOLHDL, LDLDIRECT in the last 72 hours. Thyroid Function Tests: No results for input(s): TSH, T4TOTAL, FREET4, T3FREE, THYROIDAB in the last 72 hours. Anemia Panel: No results for input(s): VITAMINB12, FOLATE, FERRITIN, TIBC, IRON, RETICCTPCT in the last 72 hours. Urine analysis:    Component Value Date/Time   COLORURINE AMBER (A) 12/15/2019 0920   APPEARANCEUR CLOUDY (A) 12/15/2019 0920   APPEARANCEUR Cloudy (A) 12/12/2019 1332   LABSPEC 1.035 (H) 12/15/2019 0920   PHURINE  5.0 12/15/2019 0920   GLUCOSEU 50 (A) 12/15/2019 0920   HGBUR LARGE (A) 12/15/2019 0920   BILIRUBINUR NEGATIVE 12/15/2019 0920   BILIRUBINUR Negative 12/12/2019 1332   KETONESUR 5 (A) 12/15/2019 0920   PROTEINUR 100 (A) 12/15/2019 0920   UROBILINOGEN 0.2 02/12/2018 0805   NITRITE NEGATIVE 12/15/2019 0920   LEUKOCYTESUR NEGATIVE 12/15/2019 0920   Sepsis Labs: @LABRCNTIP (procalcitonin:4,lacticidven:4)  ) Recent Results (from the past 240 hour(s))  Blood culture (routine x 2)     Status: None   Collection Time: 12/13/19  3:04 PM   Specimen: BLOOD  Result Value Ref Range Status   Specimen Description BLOOD RIGHT WRIST  Final   Special Requests   Final    BOTTLES DRAWN AEROBIC AND ANAEROBIC Blood Culture results may not be optimal due to an inadequate volume of blood received in culture bottles   Culture   Final    NO GROWTH 5 DAYS Performed at [facility], [address], [place], [place] [address]    Report Status 12/18/2019 FINAL  Final  Blood culture (routine x 2)     Status: None   Collection Time: 12/13/19  3:04 PM   Specimen: BLOOD  Result Value Ref Range Status   Specimen Description BLOOD LEFT HAND  Final   Special Requests  Final    BOTTLES DRAWN AEROBIC AND ANAEROBIC Blood Culture results may not be optimal due to an inadequate volume of blood received in culture bottles   Culture   Final    NO GROWTH 5 DAYS Performed at [facility], [address] [place], [place] [address]    Report Status 12/18/2019 FINAL  Final  Respiratory Panel by RT PCR (Flu A&B, Covid) - Nasopharyngeal Swab     Status: None   Collection Time: 12/13/19  3:04 PM   Specimen: Nasopharyngeal Swab  Result Value Ref Range Status   SARS Coronavirus 2 by RT PCR NEGATIVE NEGATIVE Final    Comment: (NOTE) SARS-CoV-2 target nucleic acids are NOT DETECTED. The SARS-CoV-2 RNA is generally detectable in upper respiratoy specimens during the acute phase of infection. The  lowest concentration of SARS-CoV-2 viral copies this assay can detect is 131 copies/mL. A negative result does not preclude SARS-Cov-2 infection and should not be used as the sole basis for treatment or other patient management decisions. A negative result may occur with  improper specimen collection/handling, submission of specimen other than nasopharyngeal swab, presence of viral mutation(s) within the areas targeted by this assay, and inadequate number of viral copies (<131 copies/mL). A negative result must be combined with clinical observations, patient history, and epidemiological information. The expected result is Negative. Fact Sheet for Patients:  PinkCheek.be Fact Sheet for Healthcare Providers:  GravelBags.it This test is not yet ap proved or cleared by the [place] FDA and  has been authorized for detection and/or diagnosis of SARS-CoV-2 by FDA under an Emergency Use Authorization (EUA). This EUA will remain  in effect (meaning this test can be used) for the duration of the COVID-19 declaration under Section 564(b)(1) of the Act, 21 U.S.C. section 360bbb-3(b)(1), unless the authorization is terminated or revoked sooner.    Influenza A by PCR NEGATIVE NEGATIVE Final   Influenza B by PCR NEGATIVE NEGATIVE Final    Comment: (NOTE) The Xpert Xpress SARS-CoV-2/FLU/RSV assay is intended as an aid in  the diagnosis of influenza from Nasopharyngeal swab specimens and  should not be used as a sole basis for treatment. Nasal washings and  aspirates are unacceptable for Xpert Xpress SARS-CoV-2/FLU/RSV  testing. Fact Sheet for Patients: PinkCheek.be Fact Sheet for Healthcare Providers: GravelBags.it This test is not yet approved or cleared by the [place] FDA and  has been authorized for detection and/or diagnosis of SARS-CoV-2 by  FDA under an Emergency  Use Authorization (EUA). This EUA will remain  in effect (meaning this test can be used) for the duration of the  Covid-19 declaration under Section 564(b)(1) of the Act, 21  U.S.C. section 360bbb-3(b)(1), unless the authorization is  terminated or revoked. Performed at [facility], [address] [place], [place] [address]   Culture, blood (x 2)     Status: None   Collection Time: 12/13/19 10:30 PM   Specimen: BLOOD  Result Value Ref Range Status   Specimen Description BLOOD LEFT ANTECUBITAL  Final   Special Requests   Final    BOTTLES DRAWN AEROBIC AND ANAEROBIC Blood Culture adequate volume   Culture   Final    NO GROWTH 5 DAYS Performed at [facility], [place] [address], [place], [place] [address]    Report Status 12/18/2019 FINAL  Final  Culture, blood (x 2)     Status: None   Collection Time: 12/13/19 10:39 PM   Specimen: BLOOD RIGHT HAND  Result Value  Ref Range Status   Specimen Description BLOOD RIGHT HAND  Final   Special Requests   Final    BOTTLES DRAWN AEROBIC AND ANAEROBIC Blood Culture adequate volume   Culture   Final    NO GROWTH 5 DAYS Performed at [facility], [address] [place], [place] [address]    Report Status 12/18/2019 FINAL  Final  Urine Culture     Status: None   Collection Time: 12/15/19  9:20 AM   Specimen: Urine, Clean Catch  Result Value Ref Range Status   Specimen Description URINE, CLEAN CATCH  Final   Special Requests Normal  Final   Culture   Final    NO GROWTH Performed at [facility], [place] [address], [place], [place] [address]    Report Status 12/16/2019 FINAL  Final  MRSA PCR Screening     Status: None   Collection Time: 12/15/19  6:14 PM   Specimen: Nasal Mucosa; Nasopharyngeal  Result Value Ref Range Status   MRSA by PCR NEGATIVE NEGATIVE Final    Comment:        The GeneXpert MRSA Assay (FDA approved for NASAL specimens only), is one component of a comprehensive MRSA  colonization surveillance program. It is not intended to diagnose MRSA infection nor to guide or monitor treatment for MRSA infections. Performed at [facility], [place] [address], [place], [place] [address]       Studies: No results found.  Scheduled Meds: . amiodarone  200 mg Oral Daily  . Chlorhexidine Gluconate Cloth  6 each Topical Daily  . feeding supplement (PRO-STAT SUGAR FREE 64)  30 mL Oral BID  . insulin aspart  0-5 Units Subcutaneous QHS  . insulin aspart  0-9 Units Subcutaneous TID WC  . insulin glargine  14 Units Subcutaneous Daily  . multivitamin  1 tablet Oral QHS  . pantoprazole  40 mg Oral BID AC  . polyethylene glycol  17 g Oral Daily  . senna-docusate  1 tablet Oral BID  . sodium chloride flush  10-40 mL Intracatheter Q12H  . sucralfate  1 g Oral Q6H    Continuous Infusions: . sodium chloride 20 mL/hr at 12/19/19 1241     LOS: 9 days     Alma Friendly, MD [facility]  If 7PM-7AM, please contact night-coverage [email or web address] 12/22/2019, 2:51 PM

## 2019-12-23 LAB — CBC WITH DIFFERENTIAL/PLATELET
Abs Immature Granulocytes: 0.39 10*3/uL — ABNORMAL HIGH (ref 0.00–0.07)
Basophils Absolute: 0 10*3/uL (ref 0.0–0.1)
Basophils Relative: 0 %
Eosinophils Absolute: 0 10*3/uL (ref 0.0–0.5)
Eosinophils Relative: 0 %
HCT: 24.1 % — ABNORMAL LOW (ref 36.0–46.0)
Hemoglobin: 7.8 g/dL — ABNORMAL LOW (ref 12.0–15.0)
Immature Granulocytes: 2 %
Lymphocytes Relative: 4 %
Lymphs Abs: 0.7 10*3/uL (ref 0.7–4.0)
MCH: 28.9 pg (ref 26.0–34.0)
MCHC: 32.4 g/dL (ref 30.0–36.0)
MCV: 89.3 fL (ref 80.0–100.0)
Monocytes Absolute: 2.1 10*3/uL — ABNORMAL HIGH (ref 0.1–1.0)
Monocytes Relative: 13 %
Neutro Abs: 13.1 10*3/uL — ABNORMAL HIGH (ref 1.7–7.7)
Neutrophils Relative %: 81 %
Platelets: 157 10*3/uL (ref 150–400)
RBC: 2.7 MIL/uL — ABNORMAL LOW (ref 3.87–5.11)
RDW: 17.2 % — ABNORMAL HIGH (ref 11.5–15.5)
WBC: 16.3 10*3/uL — ABNORMAL HIGH (ref 4.0–10.5)
nRBC: 0.1 % (ref 0.0–0.2)

## 2019-12-23 LAB — RENAL FUNCTION PANEL
Albumin: 2 g/dL — ABNORMAL LOW (ref 3.5–5.0)
Anion gap: 13 (ref 5–15)
BUN: 60 mg/dL — ABNORMAL HIGH (ref 8–23)
CO2: 21 mmol/L — ABNORMAL LOW (ref 22–32)
Calcium: 7.8 mg/dL — ABNORMAL LOW (ref 8.9–10.3)
Chloride: 93 mmol/L — ABNORMAL LOW (ref 98–111)
Creatinine, Ser: 7.24 mg/dL — ABNORMAL HIGH (ref 0.44–1.00)
GFR calc Af Amer: 6 mL/min — ABNORMAL LOW (ref >60)
GFR calc non Af Amer: 5 mL/min — ABNORMAL LOW (ref >60)
Glucose, Bld: 187 mg/dL — ABNORMAL HIGH (ref 70–99)
Phosphorus: 7.3 mg/dL — ABNORMAL HIGH (ref 2.5–4.6)
Potassium: 3.6 mmol/L (ref 3.5–5.1)
Sodium: 127 mmol/L — ABNORMAL LOW (ref 135–145)

## 2019-12-23 LAB — GLUCOSE, CAPILLARY
Glucose-Capillary: 175 mg/dL — ABNORMAL HIGH (ref 70–99)
Glucose-Capillary: 122 mg/dL — ABNORMAL HIGH (ref 70–99)
Glucose-Capillary: 118 mg/dL — ABNORMAL HIGH (ref 70–99)
Glucose-Capillary: 170 mg/dL — ABNORMAL HIGH (ref 70–99)

## 2019-12-23 MED ORDER — ONDANSETRON HCL 4 MG/2ML IJ SOLN
INTRAMUSCULAR | Status: AC
  Filled 2019-12-23: qty 2

## 2019-12-23 MED ORDER — HYDROCODONE-ACETAMINOPHEN 5-325 MG PO TABS
ORAL_TABLET | ORAL | Status: AC
  Administered 2019-12-23: 2 via ORAL
  Filled 2019-12-23: qty 2

## 2019-12-23 MED ORDER — HEPARIN SODIUM (PORCINE) 1000 UNIT/ML IJ SOLN
INTRAMUSCULAR | Status: AC
  Administered 2019-12-23: 2400 [IU]
  Filled 2019-12-23: qty 3

## 2019-12-23 NOTE — Progress Notes (Signed)
Received report from [facility] in Dialysis. Patient on way back to room.

## 2019-12-23 NOTE — Progress Notes (Signed)
Physical Therapy Treatment Patient Details Name: Katherine Oliver MRN: [id number] DOB: January 07, 1946 Today's Date: 12/23/2019    History of Present Illness 74 yo admitted 2/9 with pancreatitis and UTI s/p ERCP with Afib 2/12. PMHx: DM, HTN, CKD, obesity, sepsis, ARF, Rt TKA    PT Comments    Pt in bed with spouse present upon PT arrival, agreeable to PT session this afternoon, but reports sig fatigue due to HD this morning. The pt continues to present with limitations in functional mobility and endurance compared to her prior level of function and independence due to above dx, but demonstrated good tolerance for activity today and was able to sustain SpO2 > 90% with ambulation on less supplemental O2 today. The pt will continue to progress with PT towards goals of improved functional mobility and endurance.     Follow Up Recommendations  Home health PT;Supervision for mobility/OOB     Equipment Recommendations  None recommended by PT    Recommendations for Other Services       Precautions / Restrictions Precautions Precautions: Fall Precaution Comments: watch O2 Restrictions Weight Bearing Restrictions: No    Mobility  Bed Mobility Overal bed mobility: Needs Assistance Bed Mobility: Supine to Sit     Supine to sit: Supervision;HOB elevated     General bed mobility comments: no assist needed, line management only  Transfers Overall transfer level: Needs assistance Equipment used: Rolling walker (2 wheeled) Transfers: Sit to/from Stand Sit to Stand: Min guard         General transfer comment: cues for hand placement, no assist needed  Ambulation/Gait Ambulation/Gait assistance: Min guard Gait Distance (Feet): 100 Feet Assistive device: Rolling walker (2 wheeled) Gait Pattern/deviations: Step-through pattern;Decreased stride length   Gait velocity interpretation: 1.31 - 2.62 ft/sec, indicative of limited community ambulator General Gait Details: cues for  posture and sequence, limited by fatigue. SpO2 remained 90-95% on 1L   Stairs             Wheelchair Mobility    Modified Rankin (Stroke Patients Only)       Balance Overall balance assessment: Needs assistance Sitting-balance support: No upper extremity supported;Feet supported Sitting balance-Leahy Scale: Good Sitting balance - Comments: mod I   Standing balance support: Bilateral upper extremity supported;During functional activity Standing balance-Leahy Scale: Fair Standing balance comment: pt able to stand without UE support, reliance on RW for gait                            Cognition Arousal/Alertness: Awake/alert Behavior During Therapy: WFL for tasks assessed/performed Overall Cognitive Status: Within Functional Limits for tasks assessed                                 General Comments: pt reports fatigue due to HD this morning, [facility]      Exercises      General Comments        Pertinent Vitals/Pain Pain Assessment: No/denies pain Pain Intervention(s): Limited activity within patient's tolerance;Monitored during session    Home Living                      Prior Function            PT Goals (current goals can now be found in the care plan section) Acute Rehab PT Goals Patient Stated Goal: return home PT Goal Formulation: With patient  Time For Goal Achievement: 01/05/20 Potential to Achieve Goals: Good Progress towards PT goals: Progressing toward goals    Frequency    Min 3X/week      PT Plan Current plan remains appropriate    Co-evaluation              AM-PAC PT "6 Clicks" Mobility   Outcome Measure  Help needed turning from your back to your side while in a flat bed without using bedrails?: A Little Help needed moving from lying on your back to sitting on the side of a flat bed without using bedrails?: A Little Help needed moving to and from a bed to a chair (including a wheelchair)?: A  Little Help needed standing up from a chair using your arms (e.g., wheelchair or bedside chair)?: A Little Help needed to walk in hospital room?: A Little Help needed climbing 3-5 steps with a railing? : A Little 6 Click Score: 18    End of Session Equipment Utilized During Treatment: Gait belt;Oxygen(1L) Activity Tolerance: Patient tolerated treatment well;Patient limited by fatigue Patient left: in chair;with call bell/phone within reach;with chair alarm set;with family/visitor present Nurse Communication: Mobility status(change in O2 needs from 2L to 1L) PT Visit Diagnosis: Other abnormalities of gait and mobility (R26.89)     Time: 3888-7579 PT Time Calculation (min) (ACUTE ONLY): 30 min  Charges:  $Gait Training: 23-37 mins                     Karma Ganja, PT, DPT   Acute Rehabilitation Department Pager #: [phone]   [name] 12/23/2019, 2:21 PM

## 2019-12-23 NOTE — Plan of Care (Signed)

## 2019-12-23 NOTE — Progress Notes (Signed)
PROGRESS NOTE  Katherine Oliver YBO:[id number] DOB: June 05, 1946 DOA: 12/13/2019 PCP: [name], MD  HPI/Recap of past 24 hours: HPI from PCCM 74 y/o female admitted on 2/9 with gallstone pancreatitis and UTI.  Had ERCP on 2/10 with stent placement (technically difficult and complex), followed by worsening pancreatic pain, increased tachycardia, increased work of breathing and supplemental oxygen use, worsening AKI, PCCM consulted.  General surgery also consulted due to increased intrabladder pressure and concern for possible abdominal compartment syndrome.  On 2/12 patient went into A. fib with RVR, started on diltiazem, later became hypotensive and switched to amiodarone.  Patient also had an EGD done on 2/15 due to possible GI bleed.  EGD showed stress ulceration.  Due to some improvement in overall clinical status, [facility] hospitalist assumed care on 12/21/2019.     Today, saw patient during hemodialysis, reports some nausea otherwise denies any other new complaints.  Denies any worsening abdominal pain, chest pain, shortness of breath, fever/chills.      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, without long-term current use of insulin (HCC)   HTN (hypertension)   Dyslipidemia associated with type 2 diabetes mellitus (HCC)   NAFLD (nonalcoholic fatty liver disease)   GERD (gastroesophageal reflux disease)   Polycythemia   Obesity, Class I, BMI 30-34.9   CKD stage 3 due to type 2 diabetes mellitus ([place])   Acute gallstone pancreatitis   Sepsis ([place])   Acute renal failure (ARF) ([place])   Acute respiratory failure (HCC)   Choledocholithiasis   Gallstone pancreatitis post ERCP with biliary stent placement Acute hepatitis Acute blood loss anemia/GI bleeding 2/2 gastric/stress ulcers Hemoglobin currently trending downwards  Status post 2 units of PRBC LFTs improved GI on board, continue supportive care and advance  diet as tolerated Continue PPI, Carafate Daily CBC  AKI with oliguria/anuric In the setting of sepsis and severe acute pancreatitis Status post CRRT, started on 2/11, stopped on 2/14 Currently on HD (?M/W/F) Nephrology on board  Leukocytosis Remains afebrile, hemodynamically stable No obvious signs of infection Continue to monitor off antibiotics Daily CBC  A. fib with RVR Possibly likely related to sepsis Currently rate controlled Currently on oral amiodarone, may discontinue on discharge if continues to remain sinus rhythm  Thrombocytopenia Improved Monitor closely for any signs of bleeding Daily CBC  Acute hypoxemic and hypercarbic respiratory failure Chest x-ray showed atelectasis Currently on supplemental oxygen, plan to wean off Continue incentive spirometry  Enterococcus UTI Completed antibiotics  Diabetes mellitus type 2 SSI, Lantus, Accu-Cheks, hypoglycemic protocol  Obesity Advise lifestyle modification  Physical deconditioning PT/OT Out of bed as tolerated        Malnutrition Type:  Nutrition Problem: Inadequate oral intake Etiology: altered GI function, acute illness   Malnutrition Characteristics:  Signs/Symptoms: NPO status   Nutrition Interventions:  Interventions: Refer to RD note for recommendations    Estimated body mass index is 33.18 kg/m as calculated from the following:   Height as of this encounter: 5' 0.98" (1.549 m).   Weight as of this encounter: 79.6 kg.     Code Status: Full  Family Communication: None at bedside  Disposition Plan: Once significant clinical improvement, consultant signing off, likely home   Consultants:  PCCM  GI  Nephrology  Procedures:  ERCP on 2/10  Antimicrobials:  None  DVT prophylaxis: SCD, due to GI bleed   Objective: Vitals:   12/23/19 1048 12/23/19 1125 12/23/19 1415 12/23/19 1416  BP: (!) 115/56 Marland Kitchen)  124/52    Pulse: 88 91 85 85  Resp: (!) 21 20    Temp: 98.6  F (37 C) 98.6 F (37 C)    TempSrc: Axillary Axillary    SpO2: 97% 100% 95% 95%  Weight: 79.6 kg     Height:        Intake/Output Summary (Last 24 hours) at 12/23/2019 1609 Last data filed at 12/23/2019 1300 Gross per 24 hour  Intake 300 ml  Output 1000 ml  Net -700 ml   Filed Weights   12/21/19 1045 12/23/19 0700 12/23/19 1048  Weight: 81.3 kg 80.8 kg 79.6 kg    Exam:  General: NAD, deconditioned    Cardiovascular: S1, S2 present  Respiratory: CTAB  Abdomen: Soft, +tender, nondistended, bowel sounds present  Musculoskeletal: No bilateral pedal edema noted  Skin: Normal  Psychiatry: Normal mood   Data Reviewed: CBC: Recent Labs  Lab 12/18/19 0454 12/18/19 2211 12/19/19 0419 12/19/19 0419 12/19/19 1244 12/20/19 0405 12/21/19 0820 12/22/19 0216 12/23/19 0420  WBC 20.5*   < > 32.4*  --   --  28.0* 23.4* 17.4* 16.3*  NEUTROABS 14.8*  --   --   --   --   --   --  13.6* 13.1*  HGB 8.0*   < > 7.5*   < > 9.2* 8.6* 8.5* 8.1* 7.8*  HCT 25.3*   < > 23.3*   < > 27.9* 26.7* 26.5* 24.6* 24.1*  MCV 87.8   < > 87.6  --   --  89.3 90.1 87.5 89.3  PLT 165   < > 145*  --   --  114* 105* 110* 157   < > = values in this interval not displayed.   Basic Metabolic Panel: Recent Labs  Lab 12/17/19 0335 12/17/19 1630 12/17/19 2308 12/18/19 0454 12/18/19 1618 12/19/19 0419 12/19/19 0419 12/19/19 1831 12/20/19 0405 12/21/19 0819 12/22/19 0216 12/23/19 0420  NA 139   < >  --  138   < > 135   < > 136 135 133* 132* 127*  K 4.3   < >  --  3.6   < > 4.2   < > 3.7 4.1 4.1 3.7 3.6  CL 99   < >  --  101   < > 100   < > 100 100 98 97* 93*  CO2 24   < >  --  24   < > 22   < > 23 18* 18* 21* 21*  GLUCOSE 177*   < >  --  248*   < > 285*   < > 210* 227* 198* 151* 187*  BUN 24*   < >  --  27*   < > 33*   < > 50* 56* 73* 43* 60*  CREATININE 2.55*   < >  --  2.45*   < > 2.40*   < > 3.72* 4.56* 6.90* 5.55* 7.24*  CALCIUM 8.0*   < >  --  8.2*   < > 7.7*   < > 7.7* 7.7* 8.1* 7.8*  7.8*  MG 2.5*  --  2.8* 2.6*  --  2.4  --   --  2.4  --   --   --   PHOS 2.4*   < >  --  2.2*   < > 2.7   < > 2.9 3.5 5.4* 5.6* 7.3*   < > = values in this interval not displayed.   GFR: Estimated Creatinine Clearance:  6.4 mL/min (A) (by C-G formula based on SCr of 7.24 mg/dL (H)). Liver Function Tests: Recent Labs  Lab 12/17/19 0335 12/17/19 1630 12/18/19 0454 12/18/19 1618 12/19/19 1831 12/20/19 0405 12/21/19 0819 12/22/19 0216 12/23/19 0420  AST 52*  --  38  --   --   --   --   --   --   ALT 106*  --  80*  --   --   --   --   --   --   ALKPHOS 66  --  140*  --   --   --   --   --   --   BILITOT 1.9*  --  1.0  --   --   --   --   --   --   PROT 5.5*  --  5.2*  --   --   --   --   --   --   ALBUMIN 2.5*   < > 2.4*   < > 2.5* 2.4* 2.0* 2.1* 2.0*   < > = values in this interval not displayed.   No results for input(s): LIPASE, AMYLASE in the last 168 hours. No results for input(s): AMMONIA in the last 168 hours. Coagulation Profile: No results for input(s): INR, PROTIME in the last 168 hours. Cardiac Enzymes: No results for input(s): CKTOTAL, CKMB, CKMBINDEX, TROPONINI in the last 168 hours. BNP (last 3 results) Recent Labs    12/29/18 1531  PROBNP 26.0   HbA1C: No results for input(s): HGBA1C in the last 72 hours. CBG: Recent Labs  Lab 12/22/19 1812 12/22/19 2148 12/22/19 2345 12/23/19 0641 12/23/19 1137  GLUCAP 197* 220* 181* 175* 122*   Lipid Profile: No results for input(s): CHOL, HDL, LDLCALC, TRIG, CHOLHDL, LDLDIRECT in the last 72 hours. Thyroid Function Tests: No results for input(s): TSH, T4TOTAL, FREET4, T3FREE, THYROIDAB in the last 72 hours. Anemia Panel: No results for input(s): VITAMINB12, FOLATE, FERRITIN, TIBC, IRON, RETICCTPCT in the last 72 hours. Urine analysis:    Component Value Date/Time   COLORURINE AMBER (A) 12/15/2019 0920   APPEARANCEUR CLOUDY (A) 12/15/2019 0920   APPEARANCEUR Cloudy (A) 12/12/2019 1332   LABSPEC 1.035 (H)  12/15/2019 0920   PHURINE 5.0 12/15/2019 0920   GLUCOSEU 50 (A) 12/15/2019 0920   HGBUR LARGE (A) 12/15/2019 0920   BILIRUBINUR NEGATIVE 12/15/2019 0920   BILIRUBINUR Negative 12/12/2019 1332   KETONESUR 5 (A) 12/15/2019 0920   PROTEINUR 100 (A) 12/15/2019 0920   UROBILINOGEN 0.2 02/12/2018 0805   NITRITE NEGATIVE 12/15/2019 0920   LEUKOCYTESUR NEGATIVE 12/15/2019 0920   Sepsis Labs: @LABRCNTIP (procalcitonin:4,lacticidven:4)  ) Recent Results (from the past 240 hour(s))  Culture, blood (x 2)     Status: None   Collection Time: 12/13/19 10:30 PM   Specimen: BLOOD  Result Value Ref Range Status   Specimen Description BLOOD LEFT ANTECUBITAL  Final   Special Requests   Final    BOTTLES DRAWN AEROBIC AND ANAEROBIC Blood Culture adequate volume   Culture   Final    NO GROWTH 5 DAYS Performed at [facility], [place] [address], [place], [place] [address]    Report Status 12/18/2019 FINAL  Final  Culture, blood (x 2)     Status: None   Collection Time: 12/13/19 10:39 PM   Specimen: BLOOD RIGHT HAND  Result Value Ref Range Status   Specimen Description BLOOD RIGHT HAND  Final   Special Requests   Final    BOTTLES  DRAWN AEROBIC AND ANAEROBIC Blood Culture adequate volume   Culture   Final    NO GROWTH 5 DAYS Performed at [facility], [place] [address], [place], [place] [address]    Report Status 12/18/2019 FINAL  Final  Urine Culture     Status: None   Collection Time: 12/15/19  9:20 AM   Specimen: Urine, Clean Catch  Result Value Ref Range Status   Specimen Description URINE, CLEAN CATCH  Final   Special Requests Normal  Final   Culture   Final    NO GROWTH Performed at [facility], [place] [address], [place], [place] [address]    Report Status 12/16/2019 FINAL  Final  MRSA PCR Screening     Status: None   Collection Time: 12/15/19  6:14 PM   Specimen: Nasal Mucosa; Nasopharyngeal  Result Value Ref Range Status   MRSA by PCR NEGATIVE NEGATIVE Final     Comment:        The GeneXpert MRSA Assay (FDA approved for NASAL specimens only), is one component of a comprehensive MRSA colonization surveillance program. It is not intended to diagnose MRSA infection nor to guide or monitor treatment for MRSA infections. Performed at [facility], [place] [address], [place], [place] [address]       Studies: No results found.  Scheduled Meds: . amiodarone  200 mg Oral Daily  . Chlorhexidine Gluconate Cloth  6 each Topical Daily  . feeding supplement (PRO-STAT SUGAR FREE 64)  30 mL Oral BID  . insulin aspart  0-5 Units Subcutaneous QHS  . insulin aspart  0-9 Units Subcutaneous TID WC  . insulin glargine  14 Units Subcutaneous Daily  . multivitamin  1 tablet Oral QHS  . pantoprazole  40 mg Oral BID AC  . polyethylene glycol  17 g Oral Daily  . senna-docusate  1 tablet Oral BID  . sodium chloride flush  10-40 mL Intracatheter Q12H  . sucralfate  1 g Oral Q6H    Continuous Infusions: . sodium chloride 20 mL/hr at 12/19/19 1241     LOS: 10 days     Alma Friendly, MD [facility]  If 7PM-7AM, please contact night-coverage [email or web address] 12/23/2019, 4:09 PM

## 2019-12-23 NOTE — Telephone Encounter (Signed)
Patient still in the hospital. plz call husband for update on how she's doing

## 2019-12-23 NOTE — Progress Notes (Signed)
Patient ID: Katherine Oliver, female   DOB: 07/22/1946, 74 y.o.   MRN: [id number] I was present at this dialysis session. I have reviewed the session itself and made appropriate changes.   Vital signs in last 24 hours:  Temp:  [97.6 F (36.4 C)-98 F (36.7 C)] 97.9 F (36.6 C) (02/19 0700) Pulse Rate:  [85-93] 86 (02/19 0730) Resp:  [16-18] 18 (02/19 0700) BP: (123-137)/(51-65) 123/65 (02/19 0730) SpO2:  [91 %-98 %] 98 % (02/19 0700) Weight:  [80.8 kg] 80.8 kg (02/19 0700) Weight change: -1.2 kg Filed Weights   12/21/19 0740 12/21/19 1045 12/23/19 0700  Weight: 82 kg 81.3 kg 80.8 kg    Recent Labs  Lab 12/23/19 0420  NA 127*  K 3.6  CL 93*  CO2 21*  GLUCOSE 187*  BUN 60*  CREATININE 7.24*  CALCIUM 7.8*  PHOS 7.3*    Recent Labs  Lab 12/18/19 0454 12/18/19 2211 12/21/19 0820 12/22/19 0216 12/23/19 0420  WBC 20.5*   < > 23.4* 17.4* 16.3*  NEUTROABS 14.8*  --   --  13.6* 13.1*  HGB 8.0*   < > 8.5* 8.1* 7.8*  HCT 25.3*   < > 26.5* 24.6* 24.1*  MCV 87.8   < > 90.1 87.5 89.3  PLT 165   < > 105* 110* 157   < > = values in this interval not displayed.    Scheduled Meds: . ondansetron      . amiodarone  200 mg Oral Daily  . Chlorhexidine Gluconate Cloth  6 each Topical Daily  . feeding supplement (PRO-STAT SUGAR FREE 64)  30 mL Oral BID  . insulin aspart  0-5 Units Subcutaneous QHS  . insulin aspart  0-9 Units Subcutaneous TID WC  . insulin glargine  14 Units Subcutaneous Daily  . multivitamin  1 tablet Oral QHS  . pantoprazole  40 mg Oral BID AC  . polyethylene glycol  17 g Oral Daily  . senna-docusate  1 tablet Oral BID  . sodium chloride flush  10-40 mL Intracatheter Q12H  . sucralfate  1 g Oral Q6H   Continuous Infusions: . sodium chloride 20 mL/hr at 12/19/19 1241   PRN Meds:.acetaminophen **OR** acetaminophen, fentaNYL (SUBLIMAZE) injection, hydrALAZINE, HYDROcodone-acetaminophen, ipratropium, labetalol, levalbuterol, metoCLOPramide (REGLAN)  injection, ondansetron **OR** ondansetron (ZOFRAN) IV, sodium chloride flush    Assessment/Plan:  1. AKI- in setting of sepsis and severe acute pancreatitis +/- IV contrast exposure. CRRT started on 12/15/19. Stopped 12/18/19. Remains oliguric/anuric. 1. Had difficulty with HD 12/21/19 with poorly functioning trialysis catheter. bfr decreased to 200. 2. Will continue to follow for renal recovery. 3. HD today and will follow over the weekend.  Hopefully will start to see some renal recovery.  if not will plan for HD on Monday. 4. HD catheter working well but will need new HD catheter Monday if no return of renal function (will consult IR for tdc if needed) 2. Acute gallstone pacreatitis- s/p ERCP with biliary stent placement. On liquid diet 3. Anemia of critical illness and GIB- hgb improved after transfusion. Continue to follow. 4. Atrial fibrillation with RVR- on amiodarone 5. Leukocytosis- slowly improving.  Donetta Potts,  MD 12/23/2019, 8:48 AM

## 2019-12-23 NOTE — Telephone Encounter (Signed)
Spoke with pt's husband, [name], for an update on pt.  Seems to be improving somewhat.  Pt had dialysis today.  She's eating liquid diet.  Walking with assistance and doing PT.  Says had blood transfusion due to low HGB.  Also, had another EGD.  Noticed ulcers with margins of blood.

## 2019-12-24 LAB — CBC WITH DIFFERENTIAL/PLATELET
Abs Immature Granulocytes: 0.21 10*3/uL — ABNORMAL HIGH (ref 0.00–0.07)
Basophils Absolute: 0 10*3/uL (ref 0.0–0.1)
Basophils Relative: 0 %
Eosinophils Absolute: 0 10*3/uL (ref 0.0–0.5)
Eosinophils Relative: 0 %
HCT: 24.1 % — ABNORMAL LOW (ref 36.0–46.0)
Hemoglobin: 7.6 g/dL — ABNORMAL LOW (ref 12.0–15.0)
Immature Granulocytes: 2 %
Lymphocytes Relative: 6 %
Lymphs Abs: 0.7 10*3/uL (ref 0.7–4.0)
MCH: 28.4 pg (ref 26.0–34.0)
MCHC: 31.5 g/dL (ref 30.0–36.0)
MCV: 89.9 fL (ref 80.0–100.0)
Monocytes Absolute: 1.8 10*3/uL — ABNORMAL HIGH (ref 0.1–1.0)
Monocytes Relative: 14 %
Neutro Abs: 9.6 10*3/uL — ABNORMAL HIGH (ref 1.7–7.7)
Neutrophils Relative %: 78 %
Platelets: 180 10*3/uL (ref 150–400)
RBC: 2.68 MIL/uL — ABNORMAL LOW (ref 3.87–5.11)
RDW: 16.5 % — ABNORMAL HIGH (ref 11.5–15.5)
WBC: 12.3 10*3/uL — ABNORMAL HIGH (ref 4.0–10.5)
nRBC: 0 % (ref 0.0–0.2)

## 2019-12-24 LAB — RENAL FUNCTION PANEL
Albumin: 2 g/dL — ABNORMAL LOW (ref 3.5–5.0)
Anion gap: 13 (ref 5–15)
BUN: 27 mg/dL — ABNORMAL HIGH (ref 8–23)
CO2: 25 mmol/L (ref 22–32)
Calcium: 7.9 mg/dL — ABNORMAL LOW (ref 8.9–10.3)
Chloride: 91 mmol/L — ABNORMAL LOW (ref 98–111)
Creatinine, Ser: 4.61 mg/dL — ABNORMAL HIGH (ref 0.44–1.00)
GFR calc Af Amer: 10 mL/min — ABNORMAL LOW (ref >60)
GFR calc non Af Amer: 9 mL/min — ABNORMAL LOW (ref >60)
Glucose, Bld: 169 mg/dL — ABNORMAL HIGH (ref 70–99)
Phosphorus: 6 mg/dL — ABNORMAL HIGH (ref 2.5–4.6)
Potassium: 3.6 mmol/L (ref 3.5–5.1)
Sodium: 129 mmol/L — ABNORMAL LOW (ref 135–145)

## 2019-12-24 LAB — GLUCOSE, CAPILLARY
Glucose-Capillary: 164 mg/dL — ABNORMAL HIGH (ref 70–99)
Glucose-Capillary: 228 mg/dL — ABNORMAL HIGH (ref 70–99)
Glucose-Capillary: 206 mg/dL — ABNORMAL HIGH (ref 70–99)
Glucose-Capillary: 184 mg/dL — ABNORMAL HIGH (ref 70–99)

## 2019-12-24 NOTE — Progress Notes (Signed)
PROGRESS NOTE  Katherine Oliver RWE:[id number] DOB: 1946/09/02 DOA: 12/13/2019 PCP: [name], MD  HPI/Recap of past 24 hours: HPI from PCCM 74 y/o female admitted on 2/9 with gallstone pancreatitis and UTI.  Had ERCP on 2/10 with stent placement (technically difficult and complex), followed by worsening pancreatic pain, increased tachycardia, increased work of breathing and supplemental oxygen use, worsening AKI, PCCM consulted.  General surgery also consulted due to increased intrabladder pressure and concern for possible abdominal compartment syndrome.  On 2/12 patient went into A. fib with RVR, started on diltiazem, later became hypotensive and switched to amiodarone.  Patient also had an EGD done on 2/15 due to possible GI bleed.  EGD showed stress ulceration.  Due to some improvement in overall clinical status, [facility] hospitalist assumed care on 12/21/2019.     Today, patient denies any worsening abdominal pain, shortness of breath, chest pain, nausea/vomiting, fever/chills.  Denies any new complaints.      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, without long-term current use of insulin (HCC)   HTN (hypertension)   Dyslipidemia associated with type 2 diabetes mellitus (HCC)   NAFLD (nonalcoholic fatty liver disease)   GERD (gastroesophageal reflux disease)   Polycythemia   Obesity, Class I, BMI 30-34.9   CKD stage 3 due to type 2 diabetes mellitus ([place])   Acute gallstone pancreatitis   Sepsis ([place])   Acute renal failure (ARF) ([place])   Acute respiratory failure (HCC)   Choledocholithiasis   Gallstone pancreatitis post ERCP with biliary stent placement Acute hepatitis Acute blood loss anemia/GI bleeding 2/2 gastric/stress ulcers Hemoglobin currently trending downwards, now 7.6 Status post 2 units of PRBC Typed and screened again for possible transfusion if hemoglobin falls below 7 LFTs improved GI on  board, continue supportive care and advance diet as tolerated Continue PPI, Carafate Daily CBC  AKI with oliguria/anuric/hyponatremia In the setting of sepsis and severe acute pancreatitis Status post CRRT, started on 2/11, stopped on 2/14 Currently on HD (?M/W/F) Nephrology on board  Leukocytosis Remains afebrile, hemodynamically stable No obvious signs of infection Continue to monitor off antibiotics Daily CBC  A. fib with RVR Possibly likely related to sepsis Currently rate controlled Currently on oral amiodarone, may discontinue on discharge if continues to remain sinus rhythm  Thrombocytopenia Improved Monitor closely for any signs of bleeding Daily CBC  Acute hypoxemic and hypercarbic respiratory failure Chest x-ray showed atelectasis Currently on supplemental oxygen, plan to wean off Continue incentive spirometry  Enterococcus UTI Completed antibiotics  Diabetes mellitus type 2 SSI, Lantus, Accu-Cheks, hypoglycemic protocol  Obesity Advise lifestyle modification  Physical deconditioning PT/OT Out of bed as tolerated        Malnutrition Type:  Nutrition Problem: Inadequate oral intake Etiology: altered GI function, acute illness   Malnutrition Characteristics:  Signs/Symptoms: NPO status   Nutrition Interventions:  Interventions: Refer to RD note for recommendations    Estimated body mass index is 33.18 kg/m as calculated from the following:   Height as of this encounter: 5' 0.98" (1.549 m).   Weight as of this encounter: 79.6 kg.     Code Status: Full  Family Communication: None at bedside  Disposition Plan: Once significant clinical improvement, consultant signing off, likely home   Consultants:  PCCM  GI  Nephrology  Procedures:  ERCP on 2/10  Antimicrobials:  None  DVT prophylaxis: SCD, due to GI bleed   Objective: Vitals:   12/23/19 1415 12/23/19 1416 12/23/19 2208  12/24/19 0603  BP:   (!) 125/56 (!)  132/59  Pulse: 85 85 87 92  Resp:   15 14  Temp:   (!) 97.5 F (36.4 C) 98.1 F (36.7 C)  TempSrc:   Oral Oral  SpO2: 95% 95% 94% 95%  Weight:      Height:        Intake/Output Summary (Last 24 hours) at 12/24/2019 1453 Last data filed at 12/24/2019 0900 Gross per 24 hour  Intake 1005.63 ml  Output 75 ml  Net 930.63 ml   Filed Weights   12/21/19 1045 12/23/19 0700 12/23/19 1048  Weight: 81.3 kg 80.8 kg 79.6 kg    Exam:  General: NAD, deconditioned  Cardiovascular: S1, S2 present  Respiratory: CTAB  Abdomen: Soft, nontender, nondistended, bowel sounds present  Musculoskeletal: No bilateral pedal edema noted  Skin: Normal  Psychiatry: Normal mood   Data Reviewed: CBC: Recent Labs  Lab 12/18/19 0454 12/18/19 2211 12/20/19 0405 12/21/19 0820 12/22/19 0216 12/23/19 0420 12/24/19 0402  WBC 20.5*   < > 28.0* 23.4* 17.4* 16.3* 12.3*  NEUTROABS 14.8*  --   --   --  13.6* 13.1* 9.6*  HGB 8.0*   < > 8.6* 8.5* 8.1* 7.8* 7.6*  HCT 25.3*   < > 26.7* 26.5* 24.6* 24.1* 24.1*  MCV 87.8   < > 89.3 90.1 87.5 89.3 89.9  PLT 165   < > 114* 105* 110* 157 180   < > = values in this interval not displayed.   Basic Metabolic Panel: Recent Labs  Lab 12/17/19 1630 12/17/19 2308 12/18/19 0454 12/18/19 1618 12/19/19 0419 12/19/19 1831 12/20/19 0405 12/21/19 0819 12/22/19 0216 12/23/19 0420 12/24/19 0402  NA   < >  --  138   < > 135   < > 135 133* 132* 127* 129*  K   < >  --  3.6   < > 4.2   < > 4.1 4.1 3.7 3.6 3.6  CL   < >  --  101   < > 100   < > 100 98 97* 93* 91*  CO2   < >  --  24   < > 22   < > 18* 18* 21* 21* 25  GLUCOSE   < >  --  248*   < > 285*   < > 227* 198* 151* 187* 169*  BUN   < >  --  27*   < > 33*   < > 56* 73* 43* 60* 27*  CREATININE   < >  --  2.45*   < > 2.40*   < > 4.56* 6.90* 5.55* 7.24* 4.61*  CALCIUM   < >  --  8.2*   < > 7.7*   < > 7.7* 8.1* 7.8* 7.8* 7.9*  MG  --  2.8* 2.6*  --  2.4  --  2.4  --   --   --   --   PHOS   < >  --  2.2*   < >  2.7   < > 3.5 5.4* 5.6* 7.3* 6.0*   < > = values in this interval not displayed.   GFR: Estimated Creatinine Clearance: 10.1 mL/min (A) (by C-G formula based on SCr of 4.61 mg/dL (H)). Liver Function Tests: Recent Labs  Lab 12/18/19 0454 12/18/19 1618 12/20/19 0405 12/21/19 0819 12/22/19 0216 12/23/19 0420 12/24/19 0402  AST 38  --   --   --   --   --   --  ALT 80*  --   --   --   --   --   --   ALKPHOS 140*  --   --   --   --   --   --   BILITOT 1.0  --   --   --   --   --   --   PROT 5.2*  --   --   --   --   --   --   ALBUMIN 2.4*   < > 2.4* 2.0* 2.1* 2.0* 2.0*   < > = values in this interval not displayed.   No results for input(s): LIPASE, AMYLASE in the last 168 hours. No results for input(s): AMMONIA in the last 168 hours. Coagulation Profile: No results for input(s): INR, PROTIME in the last 168 hours. Cardiac Enzymes: No results for input(s): CKTOTAL, CKMB, CKMBINDEX, TROPONINI in the last 168 hours. BNP (last 3 results) Recent Labs    12/29/18 1531  PROBNP 26.0   HbA1C: No results for input(s): HGBA1C in the last 72 hours. CBG: Recent Labs  Lab 12/23/19 1137 12/23/19 1715 12/23/19 2205 12/24/19 0749 12/24/19 1139  GLUCAP 122* 118* 170* 164* 228*   Lipid Profile: No results for input(s): CHOL, HDL, LDLCALC, TRIG, CHOLHDL, LDLDIRECT in the last 72 hours. Thyroid Function Tests: No results for input(s): TSH, T4TOTAL, FREET4, T3FREE, THYROIDAB in the last 72 hours. Anemia Panel: No results for input(s): VITAMINB12, FOLATE, FERRITIN, TIBC, IRON, RETICCTPCT in the last 72 hours. Urine analysis:    Component Value Date/Time   COLORURINE AMBER (A) 12/15/2019 0920   APPEARANCEUR CLOUDY (A) 12/15/2019 0920   APPEARANCEUR Cloudy (A) 12/12/2019 1332   LABSPEC 1.035 (H) 12/15/2019 0920   PHURINE 5.0 12/15/2019 0920   GLUCOSEU 50 (A) 12/15/2019 0920   HGBUR LARGE (A) 12/15/2019 0920   BILIRUBINUR NEGATIVE 12/15/2019 0920   BILIRUBINUR Negative 12/12/2019  1332   KETONESUR 5 (A) 12/15/2019 0920   PROTEINUR 100 (A) 12/15/2019 0920   UROBILINOGEN 0.2 02/12/2018 0805   NITRITE NEGATIVE 12/15/2019 0920   LEUKOCYTESUR NEGATIVE 12/15/2019 0920   Sepsis Labs: @LABRCNTIP (procalcitonin:4,lacticidven:4)  ) Recent Results (from the past 240 hour(s))  Urine Culture     Status: None   Collection Time: 12/15/19  9:20 AM   Specimen: Urine, Clean Catch  Result Value Ref Range Status   Specimen Description URINE, CLEAN CATCH  Final   Special Requests Normal  Final   Culture   Final    NO GROWTH Performed at [facility], [place] [address], [place], [place] [address]    Report Status 12/16/2019 FINAL  Final  MRSA PCR Screening     Status: None   Collection Time: 12/15/19  6:14 PM   Specimen: Nasal Mucosa; Nasopharyngeal  Result Value Ref Range Status   MRSA by PCR NEGATIVE NEGATIVE Final    Comment:        The GeneXpert MRSA Assay (FDA approved for NASAL specimens only), is one component of a comprehensive MRSA colonization surveillance program. It is not intended to diagnose MRSA infection nor to guide or monitor treatment for MRSA infections. Performed at [facility], [place] [address], [place], [place] [address]       Studies: No results found.  Scheduled Meds: . amiodarone  200 mg Oral Daily  . Chlorhexidine Gluconate Cloth  6 each Topical Daily  . feeding supplement (PRO-STAT SUGAR FREE 64)  30 mL Oral BID  . insulin aspart  0-5  Units Subcutaneous QHS  . insulin aspart  0-9 Units Subcutaneous TID WC  . insulin glargine  14 Units Subcutaneous Daily  . multivitamin  1 tablet Oral QHS  . pantoprazole  40 mg Oral BID AC  . polyethylene glycol  17 g Oral Daily  . senna-docusate  1 tablet Oral BID  . sodium chloride flush  10-40 mL Intracatheter Q12H  . sucralfate  1 g Oral Q6H    Continuous Infusions: . sodium chloride 10 mL/hr at 12/24/19 9796     LOS: 11 days     Alma Friendly, MD [facility]  If 7PM-7AM, please contact night-coverage [email or web address] 12/24/2019, 2:53 PM

## 2019-12-24 NOTE — Progress Notes (Signed)
Patient ID: Krystol Rocco, female   DOB: [birth year], 74 y.o.   MRN: [id number] S: Feels much better today O:BP (!) 132/59 (BP Location: Right Arm)   Pulse 92   Temp 98.1 F (36.7 C) (Oral)   Resp 14   Ht 5' 0.98" (1.549 m)   Wt 79.6 kg   SpO2 95%   BMI 33.18 kg/m   Intake/Output Summary (Last 24 hours) at 12/24/2019 1203 Last data filed at 12/24/2019 0900 Gross per 24 hour  Intake 1305.63 ml  Output 75 ml  Net 1230.63 ml   Intake/Output: I/O last 3 completed shifts: In: 885.6 [P.O.:660; I.V.:225.6] Out: 1075 [Urine:75; Other:1000]  Intake/Output this shift:  Total I/O In: 420 [P.O.:420] Out: -  Weight change: -1.2 kg Gen: NAD CVS: no rub Resp:cta Abd: benign Ext: no edema  Recent Labs  Lab 12/18/19 0454 12/18/19 1618 12/19/19 0419 12/19/19 1831 12/20/19 0405 12/21/19 0819 12/22/19 0216 12/23/19 0420 12/24/19 0402  NA 138   < > 135 136 135 133* 132* 127* 129*  K 3.6   < > 4.2 3.7 4.1 4.1 3.7 3.6 3.6  CL 101   < > 100 100 100 98 97* 93* 91*  CO2 24   < > 22 23 18* 18* 21* 21* 25  GLUCOSE 248*   < > 285* 210* 227* 198* 151* 187* 169*  BUN 27*   < > 33* 50* 56* 73* 43* 60* 27*  CREATININE 2.45*   < > 2.40* 3.72* 4.56* 6.90* 5.55* 7.24* 4.61*  ALBUMIN 2.4*   < > 2.5* 2.5* 2.4* 2.0* 2.1* 2.0* 2.0*  CALCIUM 8.2*   < > 7.7* 7.7* 7.7* 8.1* 7.8* 7.8* 7.9*  PHOS 2.2*   < > 2.7 2.9 3.5 5.4* 5.6* 7.3* 6.0*  AST 38  --   --   --   --   --   --   --   --   ALT 80*  --   --   --   --   --   --   --   --    < > = values in this interval not displayed.   Liver Function Tests: Recent Labs  Lab 12/18/19 0454 12/18/19 1618 12/22/19 0216 12/23/19 0420 12/24/19 0402  AST 38  --   --   --   --   ALT 80*  --   --   --   --   ALKPHOS 140*  --   --   --   --   BILITOT 1.0  --   --   --   --   PROT 5.2*  --   --   --   --   ALBUMIN 2.4*   < > 2.1* 2.0* 2.0*   < > = values in this interval not displayed.   No results for input(s): LIPASE, AMYLASE in the last 168  hours. No results for input(s): AMMONIA in the last 168 hours. CBC: Recent Labs  Lab 12/20/19 0405 12/20/19 0405 12/21/19 0820 12/21/19 0820 12/22/19 0216 12/23/19 0420 12/24/19 0402  WBC 28.0*   < > 23.4*   < > 17.4* 16.3* 12.3*  NEUTROABS  --   --   --   --  13.6* 13.1* 9.6*  HGB 8.6*   < > 8.5*   < > 8.1* 7.8* 7.6*  HCT 26.7*   < > 26.5*   < > 24.6* 24.1* 24.1*  MCV 89.3  --  90.1  --  87.5  89.3 89.9  PLT 114*   < > 105*   < > 110* 157 180   < > = values in this interval not displayed.   Cardiac Enzymes: No results for input(s): CKTOTAL, CKMB, CKMBINDEX, TROPONINI in the last 168 hours. CBG: Recent Labs  Lab 12/23/19 1137 12/23/19 1715 12/23/19 2205 12/24/19 0749 12/24/19 1139  GLUCAP 122* 118* 170* 164* 228*    Iron Studies: No results for input(s): IRON, TIBC, TRANSFERRIN, FERRITIN in the last 72 hours. Studies/Results: No results found. Marland Kitchen amiodarone  200 mg Oral Daily  . Chlorhexidine Gluconate Cloth  6 each Topical Daily  . feeding supplement (PRO-STAT SUGAR FREE 64)  30 mL Oral BID  . insulin aspart  0-5 Units Subcutaneous QHS  . insulin aspart  0-9 Units Subcutaneous TID WC  . insulin glargine  14 Units Subcutaneous Daily  . multivitamin  1 tablet Oral QHS  . pantoprazole  40 mg Oral BID AC  . polyethylene glycol  17 g Oral Daily  . senna-docusate  1 tablet Oral BID  . sodium chloride flush  10-40 mL Intracatheter Q12H  . sucralfate  1 g Oral Q6H    BMET    Component Value Date/Time   NA 129 (L) 12/24/2019 0402   K 3.6 12/24/2019 0402   CL 91 (L) 12/24/2019 0402   CO2 25 12/24/2019 0402   GLUCOSE 169 (H) 12/24/2019 0402   BUN 27 (H) 12/24/2019 0402   CREATININE 4.61 (H) 12/24/2019 0402   CREATININE 1.03 10/20/2013 1751   CALCIUM 7.9 (L) 12/24/2019 0402   GFRNONAA 9 (L) 12/24/2019 0402   GFRAA 10 (L) 12/24/2019 0402   CBC    Component Value Date/Time   WBC 12.3 (H) 12/24/2019 0402   RBC 2.68 (L) 12/24/2019 0402   HGB 7.6 (L) 12/24/2019  0402   HCT 24.1 (L) 12/24/2019 0402   PLT 180 12/24/2019 0402   MCV 89.9 12/24/2019 0402   MCH 28.4 12/24/2019 0402   MCHC 31.5 12/24/2019 0402   RDW 16.5 (H) 12/24/2019 0402   LYMPHSABS 0.7 12/24/2019 0402   MONOABS 1.8 (H) 12/24/2019 0402   EOSABS 0.0 12/24/2019 0402   BASOSABS 0.0 12/24/2019 0402    Assessment/Plan:  1. AKI- in setting of sepsis and severe acute pancreatitis +/- IV contrast exposure. CRRT started on 12/15/19. Stopped 12/18/19. Remains oliguric/anuric. 1. Had difficulty with HD2/17/21 with poorly functioningtrialysis catheter but worked better 12/23/19. 2. Will continue to follow for renal recovery. 3. No HD today and will follow over the weekend.  Hopefully will start to see some renal recovery.  if not will plan for HD on Monday. 4. HD catheter working well but will need new HD catheter Monday if no return of renal function (will consult IR for tdc if needed) 2. Acute gallstone pacreatitis- s/p ERCP with biliary stent placement. On liquid diet 3. Anemia of critical illness and GIB- hgb improved after transfusion. Continue to follow. 4. Atrial fibrillation with RVR- on amiodarone 5. Leukocytosis- slowly improving.  Donetta Potts, MD [employer] [phone]

## 2019-12-25 LAB — CBC WITH DIFFERENTIAL/PLATELET
Abs Immature Granulocytes: 0.19 10*3/uL — ABNORMAL HIGH (ref 0.00–0.07)
Basophils Absolute: 0 10*3/uL (ref 0.0–0.1)
Basophils Relative: 0 %
Eosinophils Absolute: 0 10*3/uL (ref 0.0–0.5)
Eosinophils Relative: 0 %
HCT: 22.8 % — ABNORMAL LOW (ref 36.0–46.0)
Hemoglobin: 7.1 g/dL — ABNORMAL LOW (ref 12.0–15.0)
Immature Granulocytes: 2 %
Lymphocytes Relative: 6 %
Lymphs Abs: 0.7 10*3/uL (ref 0.7–4.0)
MCH: 28 pg (ref 26.0–34.0)
MCHC: 31.1 g/dL (ref 30.0–36.0)
MCV: 89.8 fL (ref 80.0–100.0)
Monocytes Absolute: 1.5 10*3/uL — ABNORMAL HIGH (ref 0.1–1.0)
Monocytes Relative: 13 %
Neutro Abs: 8.8 10*3/uL — ABNORMAL HIGH (ref 1.7–7.7)
Neutrophils Relative %: 79 %
Platelets: 220 10*3/uL (ref 150–400)
RBC: 2.54 MIL/uL — ABNORMAL LOW (ref 3.87–5.11)
RDW: 15.9 % — ABNORMAL HIGH (ref 11.5–15.5)
WBC: 11.2 10*3/uL — ABNORMAL HIGH (ref 4.0–10.5)
nRBC: 0 % (ref 0.0–0.2)

## 2019-12-25 LAB — RENAL FUNCTION PANEL
Albumin: 2 g/dL — ABNORMAL LOW (ref 3.5–5.0)
Anion gap: 15 (ref 5–15)
BUN: 46 mg/dL — ABNORMAL HIGH (ref 8–23)
CO2: 24 mmol/L (ref 22–32)
Calcium: 7.9 mg/dL — ABNORMAL LOW (ref 8.9–10.3)
Chloride: 89 mmol/L — ABNORMAL LOW (ref 98–111)
Creatinine, Ser: 6.17 mg/dL — ABNORMAL HIGH (ref 0.44–1.00)
GFR calc Af Amer: 7 mL/min — ABNORMAL LOW (ref >60)
GFR calc non Af Amer: 6 mL/min — ABNORMAL LOW (ref >60)
Glucose, Bld: 150 mg/dL — ABNORMAL HIGH (ref 70–99)
Phosphorus: 7.2 mg/dL — ABNORMAL HIGH (ref 2.5–4.6)
Potassium: 3.8 mmol/L (ref 3.5–5.1)
Sodium: 128 mmol/L — ABNORMAL LOW (ref 135–145)

## 2019-12-25 LAB — GLUCOSE, CAPILLARY
Glucose-Capillary: 135 mg/dL — ABNORMAL HIGH (ref 70–99)
Glucose-Capillary: 141 mg/dL — ABNORMAL HIGH (ref 70–99)
Glucose-Capillary: 86 mg/dL (ref 70–99)
Glucose-Capillary: 105 mg/dL — ABNORMAL HIGH (ref 70–99)

## 2019-12-25 LAB — HEMOGLOBIN AND HEMATOCRIT, BLOOD
HCT: 28.4 % — ABNORMAL LOW (ref 36.0–46.0)
Hemoglobin: 9.4 g/dL — ABNORMAL LOW (ref 12.0–15.0)

## 2019-12-25 LAB — PREPARE RBC (CROSSMATCH)

## 2019-12-25 MED ORDER — SODIUM CHLORIDE 0.9% IV SOLUTION: Freq: Once | INTRAVENOUS | Status: AC

## 2019-12-25 NOTE — Progress Notes (Signed)
PROGRESS NOTE  Katherine Oliver AYT:[id number] DOB: [birth year] DOA: 12/13/2019 PCP: [name], MD  HPI/Recap of past 24 hours: HPI from PCCM 74 y/o female admitted on 2/9 with gallstone pancreatitis and UTI.  Had ERCP on 2/10 with stent placement (technically difficult and complex), followed by worsening pancreatic pain, increased tachycardia, increased work of breathing and supplemental oxygen use, worsening AKI, PCCM consulted.  General surgery also consulted due to increased intrabladder pressure and concern for possible abdominal compartment syndrome.  On 2/12 patient went into A. fib with RVR, started on diltiazem, later became hypotensive and switched to amiodarone.  Patient also had an EGD done on 2/15 due to possible GI bleed.  EGD showed stress ulceration.  Due to some improvement in overall clinical status, [facility] hospitalist assumed care on 12/21/2019.     Today, patient c/o nausea, otherwise denies any other new symptoms. 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, without long-term current use of insulin (HCC)   HTN (hypertension)   Dyslipidemia associated with type 2 diabetes mellitus (HCC)   NAFLD (nonalcoholic fatty liver disease)   GERD (gastroesophageal reflux disease)   Polycythemia   Obesity, Class I, BMI 30-34.9   CKD stage 3 due to type 2 diabetes mellitus (HCC)   Acute gallstone pancreatitis   Sepsis ([place])   Acute renal failure (ARF) ([place])   Acute respiratory failure (HCC)   Choledocholithiasis   Gallstone pancreatitis post ERCP with biliary stent placement Acute hepatitis Acute blood loss anemia/GI bleeding 2/2 gastric/stress ulcers Hemoglobin currently trending downwards, now 7.1 Status post 2 units of PRBC, will transfuse 1U of PRBC on 12/25/19 Typed and screened again for possible transfusion if hemoglobin falls below 7 LFTs improved GI on board,  continue supportive care and advance diet as tolerated Continue PPI, Carafate Daily CBC  AKI with oliguria/anuric/hyponatremia In the setting of sepsis and severe acute pancreatitis Status post CRRT, started on 2/11, stopped on 2/14 Currently on HD (M/W/F) Nephrology on board  Leukocytosis Remains afebrile, hemodynamically stable No obvious signs of infection Continue to monitor off antibiotics Daily CBC  A. fib with RVR Possibly likely related to sepsis Currently rate controlled Currently on oral amiodarone, may discontinue on discharge if continues to remain sinus rhythm  Thrombocytopenia Improved Monitor closely for any signs of bleeding Daily CBC  Acute hypoxemic and hypercarbic respiratory failure Chest x-ray showed atelectasis Currently on supplemental oxygen, plan to wean off Continue incentive spirometry  Enterococcus UTI Completed antibiotics  Diabetes mellitus type 2 SSI, Lantus, Accu-Cheks, hypoglycemic protocol  Obesity Advise lifestyle modification  Physical deconditioning PT/OT Out of bed as tolerated        Malnutrition Type:  Nutrition Problem: Inadequate oral intake Etiology: altered GI function, acute illness   Malnutrition Characteristics:  Signs/Symptoms: NPO status   Nutrition Interventions:  Interventions: Refer to RD note for recommendations    Estimated body mass index is 33.18 kg/m as calculated from the following:   Height as of this encounter: 5' 0.98" (1.549 m).   Weight as of this encounter: 79.6 kg.     Code Status: Full  Family Communication: None at bedside  Disposition Plan: Once significant clinical improvement, consultant signing off, likely home   Consultants:  PCCM  GI  Nephrology  Procedures:  ERCP on 2/10  Antimicrobials:  None  DVT prophylaxis: SCD, due to GI bleed   Objective: Vitals:   12/24/19 2107 12/25/19 0524  12/25/19 1218 12/25/19 1238  BP: (!) 116/48 (!) 128/50 (!)  120/58 129/63  Pulse: 84 84 80 82  Resp: 18 18 18 18   Temp: 98 F (36.7 C) 97.6 F (36.4 C) 98.3 F (36.8 C) 98.2 F (36.8 C)  TempSrc: Oral Oral Oral Oral  SpO2: 96% 97% 96% 97%  Weight:      Height:        Intake/Output Summary (Last 24 hours) at 12/25/2019 1435 Last data filed at 12/25/2019 0900 Gross per 24 hour  Intake 478.8 ml  Output 150 ml  Net 328.8 ml   Filed Weights   12/21/19 1045 12/23/19 0700 12/23/19 1048  Weight: 81.3 kg 80.8 kg 79.6 kg    Exam:  General: NAD, deconditioned   Cardiovascular: S1, S2 present  Respiratory: CTAB  Abdomen: Soft, nontender, nondistended, bowel sounds present  Musculoskeletal: No bilateral pedal edema noted  Skin: Normal  Psychiatry: Normal mood   Data Reviewed: CBC: Recent Labs  Lab 12/21/19 0820 12/22/19 0216 12/23/19 0420 12/24/19 0402 12/25/19 0349  WBC 23.4* 17.4* 16.3* 12.3* 11.2*  NEUTROABS  --  13.6* 13.1* 9.6* 8.8*  HGB 8.5* 8.1* 7.8* 7.6* 7.1*  HCT 26.5* 24.6* 24.1* 24.1* 22.8*  MCV 90.1 87.5 89.3 89.9 89.8  PLT 105* 110* 157 180 660   Basic Metabolic Panel: Recent Labs  Lab 12/19/19 0419 12/19/19 1831 12/20/19 0405 12/20/19 0405 12/21/19 0819 12/22/19 0216 12/23/19 0420 12/24/19 0402 12/25/19 0349  NA 135   < > 135   < > 133* 132* 127* 129* 128*  K 4.2   < > 4.1   < > 4.1 3.7 3.6 3.6 3.8  CL 100   < > 100   < > 98 97* 93* 91* 89*  CO2 22   < > 18*   < > 18* 21* 21* 25 24  GLUCOSE 285*   < > 227*   < > 198* 151* 187* 169* 150*  BUN 33*   < > 56*   < > 73* 43* 60* 27* 46*  CREATININE 2.40*   < > 4.56*   < > 6.90* 5.55* 7.24* 4.61* 6.17*  CALCIUM 7.7*   < > 7.7*   < > 8.1* 7.8* 7.8* 7.9* 7.9*  MG 2.4  --  2.4  --   --   --   --   --   --   PHOS 2.7   < > 3.5   < > 5.4* 5.6* 7.3* 6.0* 7.2*   < > = values in this interval not displayed.   GFR: Estimated Creatinine Clearance: 7.5 mL/min (A) (by C-G formula based on SCr of 6.17 mg/dL (H)). Liver Function Tests: Recent Labs  Lab  12/21/19 0819 12/22/19 0216 12/23/19 0420 12/24/19 0402 12/25/19 0349  ALBUMIN 2.0* 2.1* 2.0* 2.0* 2.0*   No results for input(s): LIPASE, AMYLASE in the last 168 hours. No results for input(s): AMMONIA in the last 168 hours. Coagulation Profile: No results for input(s): INR, PROTIME in the last 168 hours. Cardiac Enzymes: No results for input(s): CKTOTAL, CKMB, CKMBINDEX, TROPONINI in the last 168 hours. BNP (last 3 results) Recent Labs    12/29/18 1531  PROBNP 26.0   HbA1C: No results for input(s): HGBA1C in the last 72 hours. CBG: Recent Labs  Lab 12/24/19 1139 12/24/19 1726 12/24/19 2105 12/25/19 0741 12/25/19 1215  GLUCAP 228* 206* 184* 135* 141*   Lipid Profile: No results for input(s): CHOL, HDL, LDLCALC, TRIG, CHOLHDL, LDLDIRECT in the last  72 hours. Thyroid Function Tests: No results for input(s): TSH, T4TOTAL, FREET4, T3FREE, THYROIDAB in the last 72 hours. Anemia Panel: No results for input(s): VITAMINB12, FOLATE, FERRITIN, TIBC, IRON, RETICCTPCT in the last 72 hours. Urine analysis:    Component Value Date/Time   COLORURINE AMBER (A) 12/15/2019 0920   APPEARANCEUR CLOUDY (A) 12/15/2019 0920   APPEARANCEUR Cloudy (A) 12/12/2019 1332   LABSPEC 1.035 (H) 12/15/2019 0920   PHURINE 5.0 12/15/2019 0920   GLUCOSEU 50 (A) 12/15/2019 0920   HGBUR LARGE (A) 12/15/2019 0920   BILIRUBINUR NEGATIVE 12/15/2019 0920   BILIRUBINUR Negative 12/12/2019 1332   KETONESUR 5 (A) 12/15/2019 0920   PROTEINUR 100 (A) 12/15/2019 0920   UROBILINOGEN 0.2 02/12/2018 0805   NITRITE NEGATIVE 12/15/2019 0920   LEUKOCYTESUR NEGATIVE 12/15/2019 0920   Sepsis Labs: @LABRCNTIP (procalcitonin:4,lacticidven:4)  ) Recent Results (from the past 240 hour(s))  MRSA PCR Screening     Status: None   Collection Time: 12/15/19  6:14 PM   Specimen: Nasal Mucosa; Nasopharyngeal  Result Value Ref Range Status   MRSA by PCR NEGATIVE NEGATIVE Final    Comment:        The GeneXpert MRSA  Assay (FDA approved for NASAL specimens only), is one component of a comprehensive MRSA colonization surveillance program. It is not intended to diagnose MRSA infection nor to guide or monitor treatment for MRSA infections. Performed at [facility], [place] [address], [place], [place] [address]       Studies: No results found.  Scheduled Meds: . amiodarone  200 mg Oral Daily  . Chlorhexidine Gluconate Cloth  6 each Topical Daily  . feeding supplement (PRO-STAT SUGAR FREE 64)  30 mL Oral BID  . insulin aspart  0-5 Units Subcutaneous QHS  . insulin aspart  0-9 Units Subcutaneous TID WC  . insulin glargine  14 Units Subcutaneous Daily  . multivitamin  1 tablet Oral QHS  . pantoprazole  40 mg Oral BID AC  . polyethylene glycol  17 g Oral Daily  . senna-docusate  1 tablet Oral BID  . sodium chloride flush  10-40 mL Intracatheter Q12H  . sucralfate  1 g Oral Q6H    Continuous Infusions: . sodium chloride 10 mL/hr at 12/24/19 2197     LOS: 12 days     Alma Friendly, MD [facility]  If 7PM-7AM, please contact night-coverage [email or web address] 12/25/2019, 2:35 PM

## 2019-12-25 NOTE — Progress Notes (Signed)
Patient ID: Katherine Oliver, female   DOB: [birth year], 74 y.o.   MRN: [id number] S: Complaining of nausea this morning. O:BP 129/63   Pulse 82   Temp 98.2 F (36.8 C) (Oral)   Resp 18   Ht 5' 0.98" (1.549 m)   Wt 79.6 kg   SpO2 97%   BMI 33.18 kg/m   Intake/Output Summary (Last 24 hours) at 12/25/2019 1347 Last data filed at 12/25/2019 0900 Gross per 24 hour  Intake 478.8 ml  Output 150 ml  Net 328.8 ml   Intake/Output: I/O last 3 completed shifts: In: 1464.4 [P.O.:1320; I.V.:144.4] Out: 225 [Urine:225]  Intake/Output this shift:  Total I/O In: 200 [P.O.:200] Out: -  Weight change:  Gen: NAD CVS: no rub Resp: cta Abd: +BS, soft, mild tenderness, no guarding/rebound Ext:no edema  Recent Labs  Lab 12/19/19 1831 12/20/19 0405 12/21/19 0819 12/22/19 0216 12/23/19 0420 12/24/19 0402 12/25/19 0349  NA 136 135 133* 132* 127* 129* 128*  K 3.7 4.1 4.1 3.7 3.6 3.6 3.8  CL 100 100 98 97* 93* 91* 89*  CO2 23 18* 18* 21* 21* 25 24  GLUCOSE 210* 227* 198* 151* 187* 169* 150*  BUN 50* 56* 73* 43* 60* 27* 46*  CREATININE 3.72* 4.56* 6.90* 5.55* 7.24* 4.61* 6.17*  ALBUMIN 2.5* 2.4* 2.0* 2.1* 2.0* 2.0* 2.0*  CALCIUM 7.7* 7.7* 8.1* 7.8* 7.8* 7.9* 7.9*  PHOS 2.9 3.5 5.4* 5.6* 7.3* 6.0* 7.2*   Liver Function Tests: Recent Labs  Lab 12/23/19 0420 12/24/19 0402 12/25/19 0349  ALBUMIN 2.0* 2.0* 2.0*   No results for input(s): LIPASE, AMYLASE in the last 168 hours. No results for input(s): AMMONIA in the last 168 hours. CBC: Recent Labs  Lab 12/21/19 0820 12/21/19 0820 12/22/19 0216 12/22/19 0216 12/23/19 0420 12/24/19 0402 12/25/19 0349  WBC 23.4*   < > 17.4*   < > 16.3* 12.3* 11.2*  NEUTROABS  --   --  13.6*   < > 13.1* 9.6* 8.8*  HGB 8.5*   < > 8.1*   < > 7.8* 7.6* 7.1*  HCT 26.5*   < > 24.6*   < > 24.1* 24.1* 22.8*  MCV 90.1  --  87.5  --  89.3 89.9 89.8  PLT 105*   < > 110*   < > 157 180 220   < > = values in this interval not displayed.   Cardiac  Enzymes: No results for input(s): CKTOTAL, CKMB, CKMBINDEX, TROPONINI in the last 168 hours. CBG: Recent Labs  Lab 12/24/19 1139 12/24/19 1726 12/24/19 2105 12/25/19 0741 12/25/19 1215  GLUCAP 228* 206* 184* 135* 141*    Iron Studies: No results for input(s): IRON, TIBC, TRANSFERRIN, FERRITIN in the last 72 hours. Studies/Results: No results found. Marland Kitchen amiodarone  200 mg Oral Daily  . Chlorhexidine Gluconate Cloth  6 each Topical Daily  . feeding supplement (PRO-STAT SUGAR FREE 64)  30 mL Oral BID  . insulin aspart  0-5 Units Subcutaneous QHS  . insulin aspart  0-9 Units Subcutaneous TID WC  . insulin glargine  14 Units Subcutaneous Daily  . multivitamin  1 tablet Oral QHS  . pantoprazole  40 mg Oral BID AC  . polyethylene glycol  17 g Oral Daily  . senna-docusate  1 tablet Oral BID  . sodium chloride flush  10-40 mL Intracatheter Q12H  . sucralfate  1 g Oral Q6H    BMET    Component Value Date/Time   NA 128 (L) 12/25/2019 9937  K 3.8 12/25/2019 0349   CL 89 (L) 12/25/2019 0349   CO2 24 12/25/2019 0349   GLUCOSE 150 (H) 12/25/2019 0349   BUN 46 (H) 12/25/2019 0349   CREATININE 6.17 (H) 12/25/2019 0349   CREATININE 1.03 10/20/2013 1751   CALCIUM 7.9 (L) 12/25/2019 0349   GFRNONAA 6 (L) 12/25/2019 0349   GFRAA 7 (L) 12/25/2019 0349   CBC    Component Value Date/Time   WBC 11.2 (H) 12/25/2019 0349   RBC 2.54 (L) 12/25/2019 0349   HGB 7.1 (L) 12/25/2019 0349   HCT 22.8 (L) 12/25/2019 0349   PLT 220 12/25/2019 0349   MCV 89.8 12/25/2019 0349   MCH 28.0 12/25/2019 0349   MCHC 31.1 12/25/2019 0349   RDW 15.9 (H) 12/25/2019 0349   LYMPHSABS 0.7 12/25/2019 0349   MONOABS 1.5 (H) 12/25/2019 0349   EOSABS 0.0 12/25/2019 0349   BASOSABS 0.0 12/25/2019 0349    Assessment/Plan:  1. AKI- in setting of sepsis and severe acute pancreatitis +/- IV contrast exposure. CRRT started on 12/15/19. Stopped 12/18/19. Remains oliguric/anuric. 1. Had difficulty with  HD2/17/21 with poorly functioningtrialysis catheter but worked better 12/23/19. 2. Will continue to follow for renal recovery. 3. Some increase in UOP but will plan for HD tomorrow given rising bun/cr. 4. HD catheter working well but will need new HD catheter Monday if no return of renal function (will consult IR for tdc if needed) 2. Acute gallstone pacreatitis- s/p ERCP with biliary stent placement. On liquid diet 3. Anemia of critical illness and GIB- hgb initially improved after transfusion but trending down and recommend transfusion with HD tomorrow if ok with primary svc. Continue to follow and transfuse prn. 4. Atrial fibrillation with RVR- on amiodarone 5. Leukocytosis- slowly improving. Donetta Potts, MD [employer] [phone]

## 2019-12-26 LAB — CBC WITH DIFFERENTIAL/PLATELET
Abs Immature Granulocytes: 0.18 10*3/uL — ABNORMAL HIGH (ref 0.00–0.07)
Basophils Absolute: 0.1 10*3/uL (ref 0.0–0.1)
Basophils Relative: 1 %
Eosinophils Absolute: 0 10*3/uL (ref 0.0–0.5)
Eosinophils Relative: 0 %
HCT: 28 % — ABNORMAL LOW (ref 36.0–46.0)
Hemoglobin: 9.2 g/dL — ABNORMAL LOW (ref 12.0–15.0)
Immature Granulocytes: 2 %
Lymphocytes Relative: 7 %
Lymphs Abs: 0.8 10*3/uL (ref 0.7–4.0)
MCH: 28.5 pg (ref 26.0–34.0)
MCHC: 32.9 g/dL (ref 30.0–36.0)
MCV: 86.7 fL (ref 80.0–100.0)
Monocytes Absolute: 1.4 10*3/uL — ABNORMAL HIGH (ref 0.1–1.0)
Monocytes Relative: 12 %
Neutro Abs: 8.8 10*3/uL — ABNORMAL HIGH (ref 1.7–7.7)
Neutrophils Relative %: 78 %
Platelets: 270 10*3/uL (ref 150–400)
RBC: 3.23 MIL/uL — ABNORMAL LOW (ref 3.87–5.11)
RDW: 15.7 % — ABNORMAL HIGH (ref 11.5–15.5)
WBC: 11.2 10*3/uL — ABNORMAL HIGH (ref 4.0–10.5)
nRBC: 0 % (ref 0.0–0.2)

## 2019-12-26 LAB — RENAL FUNCTION PANEL
Albumin: 2.1 g/dL — ABNORMAL LOW (ref 3.5–5.0)
Anion gap: 17 — ABNORMAL HIGH (ref 5–15)
BUN: 63 mg/dL — ABNORMAL HIGH (ref 8–23)
CO2: 20 mmol/L — ABNORMAL LOW (ref 22–32)
Calcium: 7.9 mg/dL — ABNORMAL LOW (ref 8.9–10.3)
Chloride: 86 mmol/L — ABNORMAL LOW (ref 98–111)
Creatinine, Ser: 7.43 mg/dL — ABNORMAL HIGH (ref 0.44–1.00)
GFR calc Af Amer: 6 mL/min — ABNORMAL LOW (ref >60)
GFR calc non Af Amer: 5 mL/min — ABNORMAL LOW (ref >60)
Glucose, Bld: 120 mg/dL — ABNORMAL HIGH (ref 70–99)
Phosphorus: 8.4 mg/dL — ABNORMAL HIGH (ref 2.5–4.6)
Potassium: 3.9 mmol/L (ref 3.5–5.1)
Sodium: 123 mmol/L — ABNORMAL LOW (ref 135–145)

## 2019-12-26 LAB — GLUCOSE, CAPILLARY
Glucose-Capillary: 119 mg/dL — ABNORMAL HIGH (ref 70–99)
Glucose-Capillary: 106 mg/dL — ABNORMAL HIGH (ref 70–99)
Glucose-Capillary: 166 mg/dL — ABNORMAL HIGH (ref 70–99)
Glucose-Capillary: 137 mg/dL — ABNORMAL HIGH (ref 70–99)

## 2019-12-26 MED ORDER — HEPARIN SODIUM (PORCINE) 1000 UNIT/ML IJ SOLN
INTRAMUSCULAR | Status: AC
  Filled 2019-12-26: qty 3

## 2019-12-26 NOTE — Consult Note (Addendum)
Chief Complaint: Patient was seen in consultation today for tunneled dialysis catheter placement at the request of Dr Katheren Puller   Supervising Physician: Corrie Mckusick  Patient Status: [facility] - In-pt  History of Present Illness: Katherine Oliver is a 74 y.o. female   AKI-- sepsis Setting of acute pancreatitis Started CRRT 12/15/19-- Rt IJ temp triple lumen cath placed 2/11 - CCM  In dialysis today RN cannot get flows to meet criteria Dr Royce Macadamia requesting tunneled placement for ongoing use for dialysis today and as OP Renal function not yet in recovery  Scheduled now for same    Past Medical History:  Diagnosis Date  . Allergic rhinitis   . Arthritis   . 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 nephropathy ([place])    DSME at [facility] 01/2016   . Frequent epistaxis 05/16/2019   S/p cauterization with resolution 2020  . GERD (gastroesophageal reflux disease)   . Hepatic steatosis    by abd [place] 05/2012, mild transaminitis - normal iron sat and viral hep panel (2011), stable [place] 2017  . History of chicken pox   . History of measles   . History of recurrent UTIs    on chronic keflex  . HLD (hyperlipidemia)   . HTN (hypertension)   . 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 Kidney cyst, acquired 01/2016   L kidney by [place]  . Kidney stone 01/2016   L kidney by [place]  . Lung nodules 11/2013   overall stable on f/u CT 01/2016  . Osteopenia 06/2013   mild, forearm T -1.1, hip and spine WNL  . Polycythemia    mild, stable (2013)  . Primary localized osteoarthritis of right knee 01/05/2019  . Rosacea    metrogel    Past Surgical History:  Procedure Laterality Date  . APPENDECTOMY  1987  . BILIARY STENT PLACEMENT  12/14/2019   Procedure: BILIARY STENT PLACEMENT;  Surgeon: [name], MD;  Location: [place];  Service:  Endoscopy;;  . BREAST BIOPSY Right 1963   benign  . BREAST BIOPSY Right 08/2014   benign- core  . cardiolite stress test  04/2004   normal  . CESAREAN SECTION  [id number]   x2  . CHOLECYSTECTOMY  2003  . COLONOSCOPY  09/26/2008   adenomatous polyp, rpt 3-5 yrs  . COLONOSCOPY  08/2012   adenomatous polyps, diverticulosis, rec rpt 5 yrs [name])  . COLONOSCOPY WITH PROPOFOL N/A 02/05/2018   4TA, SSA, diverticulosis, rpt 3 yrs [name], [name], MD)  . dexa  2003   normal  . dexa  06/2013   [facility] - Tscore -1.1 forearm, normal spine and femur  . ERCP N/A 12/14/2019   Procedure: ENDOSCOPIC RETROGRADE CHOLANGIOPANCREATOGRAPHY (ERCP);  Surgeon: [name], MD;  Location: [place];  Service: Endoscopy;  Laterality: N/A;  . ESOPHAGOGASTRODUODENOSCOPY N/A 12/19/2019   Procedure: ESOPHAGOGASTRODUODENOSCOPY (EGD);  Surgeon: [name], MD;  Location: [facility];  Service: Endoscopy;  Laterality: N/A;  . SPHINCTEROTOMY  12/14/2019   Procedure: SPHINCTEROTOMY;  Surgeon: [name], MD;  Location: [place];  Service: Endoscopy;;  . 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 TRANSTHORACIC ECHOCARDIOGRAM  04/2019   EF 55-60%, modLVH, impaired relaxation   .  TRIGGER FINGER RELEASE  2007;2010;2011   bilateral  . TRIGGER FINGER RELEASE  02/2017  . VAGINAL HYSTERECTOMY  1984   for menorrhagia, ovaries in place    Allergies: Azithromycin, Nickel, Sulfa antibiotics, and Adhesive [tape]  Medications: Prior to Admission medications   Medication Sig Start Date End Date Taking? Authorizing Provider  insulin aspart (NOVOLOG) 100 UNIT/ML injection Inject 0-6 Units into the skin every 4 (four) hours. 12/13/19   Dhungel, Nishant, MD  ondansetron (ZOFRAN) 4 MG tablet Take 1 tablet (4 mg total) by mouth every 6 (six) hours as needed for nausea. 12/13/19   Dhungel,  Nishant, MD  pantoprazole (PROTONIX) 40 MG injection Inject 40 mg into the vein daily. 12/13/19   Dhungel, Flonnie Overman, MD     Family History  Problem Relation Age of Onset  . Stroke Mother        several  . Hyperlipidemia Mother   . Hypertension Mother   . Cancer Father        colon  . Hypertension Father   . Hyperlipidemia Father   . Coronary artery disease Father 23       MIx1, CABG  . Cancer Paternal Aunt        abdominal  . Coronary artery disease Maternal Grandmother   . Diabetes Maternal Grandfather   . Coronary artery disease Maternal Grandfather   . Breast cancer Neg Hx     Social History   Socioeconomic History  . Marital status: Married    Spouse name: Not on file  . Number of children: Not on file  . Years of education: Not on file  . Highest education level: Not on file  Occupational History  . Not on file  Tobacco Use  . Smoking status: Never Smoker  . Smokeless tobacco: Never Used  Substance and Sexual Activity  . Alcohol use: Not Currently    Alcohol/week: 1.0 - 2.0 standard drinks    Types: 1 - 2 Glasses of wine per week    Comment: Occasional/1 mixed drink per month  . Drug use: No  . Sexual activity: Yes  Other Topics Concern  . 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 Daily walking about 20 min.    Diet: ok water intake 4 glasses/day, daily fruits/vegetables, red meat 4x/wk, fish 3-4x/wk   Social Determinants of Health   Financial Resource Strain: Low Risk   . Difficulty of Paying Living Expenses: Not hard at all  Food Insecurity: No Food Insecurity  . Worried About Charity fundraiser in the Last Year: Never true  . Ran Out of Food in the Last Year: Never true  Transportation Needs: No Transportation Needs  . Lack of  Transportation (Medical): No  . Lack of Transportation (Non-Medical): No  Physical Activity: Inactive  . Days of Exercise per Week: 0 days  . Minutes of Exercise per Session: 0 min  Stress: No Stress Concern Present  . Feeling of Stress : Not at all  Social Connections:   . Frequency of Communication with Friends and Family: Not on file  . Frequency of Social Gatherings with Friends and Family: Not on file  . Attends Religious Services: Not on file  . Active Member of Clubs or Organizations: Not on file  . Attends  Club or Organization Meetings: Not on file  . Marital Status: Not on file    Review of Systems: A 12 point ROS discussed and pertinent positives are indicated in the HPI above.  All other systems are negative.  Review of Systems  Constitutional: Positive for activity change and fatigue. Negative for fever.  Respiratory: Positive for shortness of breath.   Cardiovascular: Negative for chest pain.  Gastrointestinal: Negative for abdominal pain.  Neurological: Positive for weakness.  Psychiatric/Behavioral: Negative for behavioral problems and confusion.    Vital Signs: BP 138/61   Pulse 92   Temp 97.6 F (36.4 C) (Oral)   Resp 14   Ht 5' 0.98" (1.549 m)   Wt 175 lb 7.8 oz (79.6 kg)   SpO2 97%   BMI 33.18 kg/m   Physical Exam Vitals reviewed.  Cardiovascular:     Rate and Rhythm: Regular rhythm.     Heart sounds: Normal heart sounds.  Pulmonary:     Breath sounds: Normal breath sounds.  Abdominal:     Palpations: Abdomen is soft.  Musculoskeletal:        General: Normal range of motion.     Comments: Rt IJ triple lumen HD temp cath  Skin:    General: Skin is warm and dry.  Neurological:     Mental Status: She is alert and oriented to person, place, and time.  Psychiatric:        Behavior: Behavior normal.        Thought Content: Thought content normal.        Judgment: Judgment normal.     Imaging: DG Abd 1 View  Result Date: 12/14/2019 CLINICAL  DATA:  74 year old with acute pancreatitis and acute onset of abdominal distention. EXAM: ABDOMEN - 1 VIEW COMPARISON:  CT abdomen and pelvis yesterday. FINDINGS: Since the CT yesterday, development of borderline to mild gaseous distension of small bowel throughout the abdomen. Gas within normal caliber ascending colon. Remainder of the colon decompressed as on yesterday's CT. Residual contrast material in the renal parenchyma bilaterally and in the urinary bladder related to yesterday's CT, query acute kidney injury. No suggestion of free air on the supine image. Surgical clips in the RIGHT UPPER QUADRANT from prior cholecystectomy. IMPRESSION: 1. Mild generalized ileus. No evidence of bowel obstruction currently. 2. Residual contrast material in the renal parenchyma bilaterally and in the urinary bladder related to yesterday's CT, query acute kidney injury/renal insufficiency. Electronically Signed   By: Evangeline Dakin M.D.   On: 12/14/2019 09:37   CT Angio Chest PE W and/or Wo Contrast  Result Date: 12/13/2019 CLINICAL DATA:  74 year old female with lower abdominal pain. Shortness of breath. EXAM: CT ANGIOGRAPHY CHEST CT ABDOMEN AND PELVIS WITH CONTRAST TECHNIQUE: Multidetector CT imaging of the chest was performed using the standard protocol during bolus administration of intravenous contrast. Multiplanar CT image reconstructions and MIPs were obtained to evaluate the vascular anatomy. Multidetector CT imaging of the abdomen and pelvis was performed using the standard protocol during bolus administration of intravenous contrast. CONTRAST:  27mL OMNIPAQUE IOHEXOL 350 MG/ML SOLN COMPARISON:  Chest CT 01/11/2016 and CT abdomen 12/02/2013 FINDINGS: CTA CHEST FINDINGS Cardiovascular: Negative for pulmonary embolism. Normal caliber of the thoracic aorta. Heart size is within normal limits. No significant pericardial fluid. Great vessels patent. Mediastinum/Nodes: Normal appearance of mediastinal structures. No  significant node enlargement in the mediastinum or hilar regions. There are small right axillary lymph nodes. However, these right axillary lymph nodes have clearly enlarged from the  exam in 2017. Index lymph node measures up to 8 mm on sequence 1, image 21 and this lymph node was barely perceptible on the prior examination. Mild lymphadenopathy in the right axilla is nonspecific. No significant left axillary lymph node enlargement. Lungs/Pleura: Trachea and mainstem bronchi are patent. Dependent atelectasis in both lower lobes. Again noted are small scattered pulmonary nodules. Index lesion in the right middle lobe on sequence 4, image 46 measures 4 mm and stable. There are additional small scattered pulmonary nodules predominantly along the pleural surfaces. Majority of these small pulmonary nodules are stable. Limited evaluation of the lung bases due to the atelectasis. No large pleural effusions. Question a new 2 mm nodule in the periphery of the right lower lobe on sequence 4, image 51. Musculoskeletal: No acute bone abnormality. Review of the MIP images confirms the above findings. CT ABDOMEN and PELVIS FINDINGS Hepatobiliary: Diffuse low-attenuation of the liver is compatible with steatosis. Gallbladder has been removed. Subtle low-density along periphery of the posterior right hepatic lobe on sequence 4, image 20 is nonspecific. There is mild intrahepatic biliary dilatation. Mild dilatation of the common bile duct measuring up to 9 mm on sequence 4, image 38. Large amount of edema and stranding in the porta hepatis. There is a calcification near the distal common bile duct and this is compatible with an obstructing bile duct stone. Pancreas: Extensive edema and inflammatory changes in the upper abdomen centered around the pancreas. Pancreatic enhancement is heterogeneous and compatible with edema and possibly areas of pancreatic necrosis. No discrete fluid or pseudocyst formations. No significant pancreatic  duct dilatation. Spleen: Normal in size without focal abnormality. Adrenals/Urinary Tract: Normal adrenal glands. Stable appearance of the right kidney. Tiny hypodensities along the right kidney lower pole are suggestive for small cysts. Right extrarenal pelvis is unchanged without right hydronephrosis. Again noted is a exophytic cyst in the left kidney lower pole. There is a new area of decreased attenuation along the posterior left kidney lower pole region on sequence 4, image 50. The central aspect of the low-density area is hyperdense and may be enhancing. Based on the sagittal reformats, this could represent a renal infarct or focal pyelonephritis. Difficult to exclude an atypical lesion at this location. Negative for left hydronephrosis. Normal appearance of the urinary bladder. Indeterminate low-density structure in the medial left kidney on sequence 9 image 27 measures up to 1.0 cm. Stomach/Bowel: Diverticulosis in the sigmoid colon without colonic inflammation. Normal appearance stomach. No evidence for small or large bowel struck shin. Calcification near the distal common bile duct is likely at the ampulla. 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 Main visceral arteries are patent. No abdominopelvic lymphadenopathy. Portal venous system is patent. Splenic vein is patent. Reproductive: Hysterectomy. New or enlarged low-density structure involving the right ovary on sequence 4, image 71. No evidence for a left adnexal lesion. Other: Large amount of edema in the central abdominal mesentery and around the duodenum and porta hepatis. No ascites in pelvis. Negative free air. Musculoskeletal: Focal sclerosis in the left ilium on sequence 4, image 69 is stable. No acute bone abnormality. Review of the MIP images confirms the above findings. IMPRESSION: 1. Acute pancreatitis likely secondary to an obstructive stone in the distal common bile duct near the ampulla. 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 No evidence for pseudocyst formations at time. Mild intrahepatic and extrahepatic biliary dilatation. Subtle low-density along the posterior right hepatic lobe may be related to inflammation but  this area is indeterminate. 2. Indeterminate left renal lesions. Unusual lesion or hypodensities in the posterior left kidney lower pole could represent areas of infarct, focal pyelonephritis or atypical neoplastic lesion. There is also a new indeterminate 1 cm lesion in the medial left kidney. Recommend follow-up renal MRI when patient is stable and can not tolerate an abdominal MRI. 3. Negative for pulmonary embolism.  Volume loss at the lung bases. 4. Chronic small pulmonary nodules. Index pulmonary nodule in right middle lobe has not significantly changed since 2017. 5. **An incidental finding of potential clinical significance has been found. Mildly enlarged right axillary lymph nodes of uncertain etiology. These lymph nodes are small but have enlarged since 2017.** 6. **An incidental finding of potential clinical significance has been found. 2.5 cm low-density structure in the right ovary. This could represent an ovarian or adnexal cyst but atypical for a patient of this age. Recommend follow-up pelvic ultrasound.** These results were called by telephone at the time of interpretation on 12/13/2019 at 4:25 pm to provider [facility] , who verbally acknowledged these results. Electronically Signed   By: Markus Daft M.D.   On: 12/13/2019 16:35   CT ABDOMEN PELVIS W CONTRAST  Result Date: 12/13/2019 CLINICAL DATA:  74 year old female with lower abdominal pain. Shortness of breath. EXAM: CT ANGIOGRAPHY CHEST CT ABDOMEN AND PELVIS WITH CONTRAST TECHNIQUE: Multidetector CT imaging of the chest was performed using the standard protocol during bolus administration of intravenous contrast. Multiplanar CT image reconstructions and MIPs were obtained to evaluate the  vascular anatomy. Multidetector CT imaging of the abdomen and pelvis was performed using the standard protocol during bolus administration of intravenous contrast. CONTRAST:  41mL OMNIPAQUE IOHEXOL 350 MG/ML SOLN COMPARISON:  Chest CT 01/11/2016 and CT abdomen 12/02/2013 FINDINGS: CTA CHEST FINDINGS Cardiovascular: Negative for pulmonary embolism. Normal caliber of the thoracic aorta. Heart size is within normal limits. No significant pericardial fluid. Great vessels patent. Mediastinum/Nodes: Normal appearance of mediastinal structures. No significant node enlargement in the mediastinum or hilar regions. There are small right axillary lymph nodes. However, these right axillary lymph nodes have clearly enlarged from the exam in 2017. Index lymph node measures up to 8 mm on sequence 1, image 21 and this lymph node was barely perceptible on the prior examination. Mild lymphadenopathy in the right axilla is nonspecific. No significant left axillary lymph node enlargement. Lungs/Pleura: Trachea and mainstem bronchi are patent. Dependent atelectasis in both lower lobes. Again noted are small scattered pulmonary nodules. Index lesion in the right middle lobe on sequence 4, image 46 measures 4 mm and stable. There are additional small scattered pulmonary nodules predominantly along the pleural surfaces. Majority of these small pulmonary nodules are stable. Limited evaluation of the lung bases due to the atelectasis. No large pleural effusions. Question a new 2 mm nodule in the periphery of the right lower lobe on sequence 4, image 51. Musculoskeletal: No acute bone abnormality. Review of the MIP images confirms the above findings. CT ABDOMEN and PELVIS FINDINGS Hepatobiliary: Diffuse low-attenuation of the liver is compatible with steatosis. Gallbladder has been removed. Subtle low-density along periphery of the posterior right hepatic lobe on sequence 4, image 20 is nonspecific. There is mild intrahepatic biliary  dilatation. Mild dilatation of the common bile duct measuring up to 9 mm on sequence 4, image 38. Large amount of edema and stranding in the porta hepatis. There is a calcification near the distal common bile duct and this is compatible with an obstructing bile duct stone.  Pancreas: Extensive edema and inflammatory changes in the upper abdomen centered around the pancreas. Pancreatic enhancement is heterogeneous and compatible with edema and possibly areas of pancreatic necrosis. No discrete fluid or pseudocyst formations. No significant pancreatic duct dilatation. Spleen: Normal in size without focal abnormality. Adrenals/Urinary Tract: Normal adrenal glands. Stable appearance of the right kidney. Tiny hypodensities along the right kidney lower pole are suggestive for small cysts. Right extrarenal pelvis is unchanged without right hydronephrosis. Again noted is a exophytic cyst in the left kidney lower pole. There is a new area of decreased attenuation along the posterior left kidney lower pole region on sequence 4, image 50. The central aspect of the low-density area is hyperdense and may be enhancing. Based on the sagittal reformats, this could represent a renal infarct or focal pyelonephritis. Difficult to exclude an atypical lesion at this location. Negative for left hydronephrosis. Normal appearance of the urinary bladder. Indeterminate low-density structure in the medial left kidney on sequence 9 image 27 measures up to 1.0 cm. Stomach/Bowel: Diverticulosis in the sigmoid colon without colonic inflammation. Normal appearance stomach. No evidence for small or large bowel struck shin. Calcification near the distal common bile duct is likely at the ampulla. 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 Main visceral arteries are patent. No abdominopelvic lymphadenopathy. Portal venous system is patent. Splenic vein is patent. Reproductive: Hysterectomy. New or enlarged  low-density structure involving the right ovary on sequence 4, image 71. No evidence for a left adnexal lesion. Other: Large amount of edema in the central abdominal mesentery and around the duodenum and porta hepatis. No ascites in pelvis. Negative free air. Musculoskeletal: Focal sclerosis in the left ilium on sequence 4, image 69 is stable. No acute bone abnormality. Review of the MIP images confirms the above findings. IMPRESSION: 1. Acute pancreatitis likely secondary to an obstructive stone in the distal common bile duct near the ampulla. 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 No evidence for pseudocyst formations at time. Mild intrahepatic and extrahepatic biliary dilatation. Subtle low-density along the posterior right hepatic lobe may be related to inflammation but this area is indeterminate. 2. Indeterminate left renal lesions. Unusual lesion or hypodensities in the posterior left kidney lower pole could represent areas of infarct, focal pyelonephritis or atypical neoplastic lesion. There is also a new indeterminate 1 cm lesion in the medial left kidney. Recommend follow-up renal MRI when patient is stable and can not tolerate an abdominal MRI. 3. Negative for pulmonary embolism.  Volume loss at the lung bases. 4. Chronic small pulmonary nodules. Index pulmonary nodule in right middle lobe has not significantly changed since 2017. 5. **An incidental finding of potential clinical significance has been found. Mildly enlarged right axillary lymph nodes of uncertain etiology. These lymph nodes are small but have enlarged since 2017.** 6. **An incidental finding of potential clinical significance has been found. 2.5 cm low-density structure in the right ovary. This could represent an ovarian or adnexal cyst but atypical for a patient of this age. Recommend follow-up pelvic ultrasound.** These results were called by telephone at the time of interpretation on 12/13/2019 at 4:25  pm to provider [facility] , who verbally acknowledged these results. Electronically Signed   By: Markus Daft M.D.   On: 12/13/2019 16:35   US RENAL  Result Date: 12/15/2019 CLINICAL DATA:  Acute kidney injury. EXAM: RENAL / URINARY TRACT ULTRASOUND COMPLETE COMPARISON:  Abdominal CT 2 days prior 12/13/2019 FINDINGS: Right Kidney: Renal measurements: 11.7 x  5.6 x 5.4 cm = volume: 186 mL. Echogenicity within normal limits. No mass or hydronephrosis visualized. Tiny hypodensity in the lower right kidney is not well visualized sonographically. Left Kidney: Renal measurements: 12.7 x 5.0 x 4.8 cm = volume: 157 mL. Echogenicity within normal limits. No solid mass or hydronephrosis visualized. Cyst measuring 1.5 x 1.6 x 1.2 cm in the lower kidney. The additional low-density lesion on prior CT is not well visualized. Bladder: Decompressed by Foley catheter. Other: Hepatic steatosis incidentally noted. IMPRESSION: 1. No hydronephrosis or obstructive uropathy. 2. Small cyst in the lower left kidney. Additional indeterminate left renal lesions on CT are not well visualized sonographically. As recommended on prior CT, elective follow-up renal protocol MRI is recommended when patient is able and can tolerate breath hold technique. Electronically Signed   By: Keith Rake M.D.   On: 12/15/2019 15:01   DG CHEST PORT 1 VIEW  Result Date: 12/17/2019 CLINICAL DATA:  Acute respiratory failure. EXAM: PORTABLE CHEST 1 VIEW COMPARISON:  12/16/2019 FINDINGS: Right IJ catheter tip is at the cavoatrial junction. Feeding tube and NG tube are identified. The tips are both well below the level of the GE junction. Heart size is normal. Unchanged bilateral pleural effusions. Mild bibasilar atelectasis. IMPRESSION: Stable bilateral pleural effusions with bibasilar atelectasis. Electronically Signed   By: Kerby Moors M.D.   On: 12/17/2019 07:42   DG Chest Port 1 View  Result Date: 12/16/2019 CLINICAL DATA:  Complication of  central venous catheter EXAM: PORTABLE CHEST 1 VIEW COMPARISON:  12/15/2019, 12/16/2019 FINDINGS: Single frontal view of the chest demonstrates stable position of the right internal jugular central venous catheter overlying superior vena cava. There are 2 enteric catheters extending below the diaphragm, tips excluded by collimation. Cardiac silhouette is stable. Bibasilar veiling opacities, right greater than left, consistent with consolidation and effusion. No pneumothorax. IMPRESSION: 1. Support devices as above. 2. Stable bibasilar veiling opacities consistent with consolidation and effusions. Electronically Signed   By: Randa Ngo M.D.   On: 12/16/2019 20:33   DG CHEST PORT 1 VIEW  Result Date: 12/16/2019 CLINICAL DATA:  Acute respiratory failure EXAM: PORTABLE CHEST 1 VIEW COMPARISON:  12/14/2018 FINDINGS: Gastric catheter and right jugular dialysis catheter are noted and stable. Cardiac shadow is within normal limits. Small bilateral pleural effusions are seen with bibasilar atelectasis. These have increased slightly in the interval from the prior exam IMPRESSION: Slight increase in pleural effusion particularly on the right. Bibasilar atelectatic changes are noted. Electronically Signed   By: Inez Catalina M.D.   On: 12/16/2019 09:23   DG Chest Port 1 View  Result Date: 12/15/2019 CLINICAL DATA:  74 year old female central line placement. EXAM: PORTABLE CHEST 1 VIEW COMPARISON:  1436 hours today. FINDINGS: Portable AP semi upright view at 1607 hours. Right IJ approach central line placed, tip at the level of the lower SVC. No pneumothorax. Stable cardiac size and mediastinal contours. Stable low lung volumes. Mildly increased perihilar platelike opacity most resembling atelectasis. Otherwise stable ventilation. Enteric tube also placed and courses to the stomach with side hole at the level of the gastric body. There is a small volume of contrast in the gastric fundus. IMPRESSION: 1. Right IJ  central line placed, tip at the lower SVC level. No adverse features. 2. Enteric tube placed, side hole at the level of the gastric body. 3. Continued low lung volumes with increased perihilar atelectasis from earlier today. Electronically Signed   By: Genevie Ann M.D.   On: 12/15/2019  16:22   DG CHEST PORT 1 VIEW  Result Date: 12/15/2019 CLINICAL DATA:  Hypoxia, abdominal pain EXAM: PORTABLE CHEST 1 VIEW COMPARISON:  12/14/2019 FINDINGS: Single frontal view of the chest demonstrates a stable cardiac silhouette. Continued low lung volumes. Increasing consolidation at the left lung base. No large effusion or pneumothorax. IMPRESSION: 1. Progressive left lower lobe consolidation favor atelectasis. Electronically Signed   By: Randa Ngo M.D.   On: 12/15/2019 15:07   DG Chest Port 1 View  Result Date: 12/14/2019 CLINICAL DATA:  74 year old with acute pancreatitis and shortness of breath. EXAM: PORTABLE CHEST 1 VIEW COMPARISON:  12/29/2018 and earlier. FINDINGS: Markedly suboptimal inspiration accounts for atelectasis in the lung bases, LEFT greater than RIGHT. Lungs otherwise clear. Pulmonary vascularity normal. Cardiac silhouette normal in size for AP portable technique and degree of inspiration. IMPRESSION: Markedly suboptimal inspiration accounts for bibasilar atelectasis, LEFT greater than RIGHT. Electronically Signed   By: Evangeline Dakin M.D.   On: 12/14/2019 09:38   DG ERCP BILIARY & PANCREATIC DUCTS  Result Date: 12/14/2019 CLINICAL DATA:  74 year old female with choledocholithiasis EXAM: ERCP TECHNIQUE: Multiple spot images obtained with the fluoroscopic device and submitted for interpretation post-procedure. FLUOROSCOPY TIME:  Fluoroscopy Time:  1 minutes 34 seconds Radiation Exposure Index (if provided by the fluoroscopic device): 24.6 mGy Number of Acquired Spot Images: 0 COMPARISON:  CT scan of the abdomen and pelvis 12/13/2019 FINDINGS: Two intraoperative saved images are submitted for review.  The images demonstrate a flexible endoscope in the descending duodenum with wire cannulation of the common bile duct. The second image demonstrates placement of a plastic biliary stent. IMPRESSION: ERCP with placement of plastic biliary stent. These images were submitted for radiologic interpretation only. Please see the procedural report for the amount of contrast and the fluoroscopy time utilized. Electronically Signed   By: Jacqulynn Cadet M.D.   On: 12/14/2019 14:37   DG Abd Portable 1V  Result Date: 12/16/2019 CLINICAL DATA:  Feeding tube placement EXAM: PORTABLE ABDOMEN - 1 VIEW COMPARISON:  None. FINDINGS: An NG tube terminates in the stomach. The feeding tube terminates near the ligament of Treitz in the left side of the abdomen. IMPRESSION: The feeding tube terminates near the ligament of Treitz in the left side of the abdomen. The NG tube is in good position. Electronically Signed   By: Dorise Bullion III M.D   On: 12/16/2019 15:22   DG Abd Portable 1V  Result Date: 12/15/2019 CLINICAL DATA:  Hypoxia, abdominal pain EXAM: PORTABLE ABDOMEN - 1 VIEW COMPARISON:  12/14/2019 FINDINGS: Supine frontal views of the abdomen and pelvis demonstrate common bile duct stent overlying right upper quadrant. Dilated gas-filled loops of small bowel in the left mid abdomen measure up to 3.3 cm in diameter. There is a relative paucity of distal bowel gas. No masses or abnormal calcifications. IMPRESSION: 1. Common bile duct stent as above. 2. Nonspecific gaseous distention of the small bowel, which could reflect postprocedural ileus. Electronically Signed   By: Randa Ngo M.D.   On: 12/15/2019 15:08    Labs:  CBC: Recent Labs    12/23/19 0420 12/23/19 0420 12/24/19 0402 12/25/19 0349 12/25/19 1705 12/26/19 0417  WBC 16.3*  --  12.3* 11.2*  --  11.2*  HGB 7.8*   < > 7.6* 7.1* 9.4* 9.2*  HCT 24.1*   < > 24.1* 22.8* 28.4* 28.0*  PLT 157  --  180 220  --  270   < > = values in this interval  not  displayed.    COAGS: Recent Labs    01/10/19 1114  INR 1.0  APTT 29    BMP: Recent Labs    12/23/19 0420 12/24/19 0402 12/25/19 0349 12/26/19 0417  NA 127* 129* 128* 123*  K 3.6 3.6 3.8 3.9  CL 93* 91* 89* 86*  CO2 21* 25 24 20*  GLUCOSE 187* 169* 150* 120*  BUN 60* 27* 46* 63*  CALCIUM 7.8* 7.9* 7.9* 7.9*  CREATININE 7.24* 4.61* 6.17* 7.43*  GFRNONAA 5* 9* 6* 5*  GFRAA 6* 10* 7* 6*    LIVER FUNCTION TESTS: Recent Labs    12/15/19 0309 12/15/19 0309 12/16/19 0446 12/16/19 1655 12/17/19 0335 12/17/19 1630 12/18/19 0454 12/18/19 1618 12/23/19 0420 12/24/19 0402 12/25/19 0349 12/26/19 0417  BILITOT 1.6*  --  1.5*  --  1.9*  --  1.0  --   --   --   --   --   AST 421*  --  104*  --  52*  --  38  --   --   --   --   --   ALT 268*  --  144*  --  106*  --  80*  --   --   --   --   --   ALKPHOS 63  --  71  --  66  --  140*  --   --   --   --   --   PROT 6.1*  --  5.4*  --  5.5*  --  5.2*  --   --   --   --   --   ALBUMIN 3.4*   < > 2.8*   < > 2.5*   < > 2.4*   < > 2.0* 2.0* 2.0* 2.1*   < > = values in this interval not displayed.    TUMOR MARKERS: No results for input(s): AFPTM, CEA, CA199, CHROMGRNA in the last 8760 hours.  Assessment and Plan:  AKI Sepsis post acute pancreatitis Started CRRT 2/11 -- CCM placed Rt IJ triple lumen In use now-- cannot reach good flow rates Nephrology requesting tunneled catheter for ongoing dialysis even as OP Risks and benefits discussed with the patient including, but not limited to bleeding, infection, vascular injury, pneumothorax which may require chest tube placement, air embolism or even death  All of the patient's questions were answered, patient is agreeable to proceed. Consent signed and in chart.   Thank you for this interesting consult.  I greatly enjoyed meeting Nikkole Placzek Sipos and look forward to participating in their care.  A copy of this report was sent to the requesting provider on this  date.  Electronically Signed: Lavonia Drafts, PA-C 12/26/2019, 10:35 AM   I spent a total of 20 Minutes    in face to face in clinical consultation, greater than 50% of which was counseling/coordinating care for tunn HD catheter placement

## 2019-12-26 NOTE — Progress Notes (Signed)
IR requested by Dr. Royce Macadamia for possible image-guided tunneled HD catheter placement.  IR unable to accomodates procedure today due to emergent procedures/scheduling. Plan for procedure tomorrow in IR. Patient to be NPO at midnight.  Please call IR with questions/concerns.   Bea Graff Delton Stelle, PA-C 12/26/2019, 3:11 PM

## 2019-12-26 NOTE — Progress Notes (Signed)
Nephrology Progress Note:   Patient ID: Katherine Oliver, female   DOB: Dec 22, 1945, 74 y.o.   MRN: [id number]    S: Seen and examined on dialysis.  Blood pressure 139/64.  Tolerating goal.  Had 300 mL UOP over 2/21.  Right IJ nontunneled catheter.  She is ok with getting tunneled catheter - discussed r/b/indications. Inc UF goal by 0.5 kg for shortness of breath (2.3 with rinse).  Review of systems  Short of breath today - on 2 liters oxygen  Had nausea yesterday - none today   O:BP (!) 162/76   Pulse 85   Temp 97.6 F (36.4 C) (Oral)   Resp 14   Ht 5' 0.98" (1.549 m)   Wt 79.6 kg   SpO2 97%   BMI 33.18 kg/m   Intake/Output Summary (Last 24 hours) at 12/26/2019 6301 Last data filed at 12/26/2019 0549 Gross per 24 hour  Intake 1036 ml  Output 300 ml  Net 736 ml   Intake/Output: I/O last 3 completed shifts: In: 6010 [P.O.:1040; Blood:436] Out: 300 [Urine:300]  Intake/Output this shift:  No intake/output data recorded. Weight change:    Physical exam:   Gen: NAD CVS: S1S2 no rub Resp: cta unlabored; on 2 liters Abd: soft, nontender; no guarding/rebound Ext: no edema Access right IJ nontunneled catheter   Recent Labs  Lab 12/20/19 0405 12/21/19 0819 12/22/19 0216 12/23/19 0420 12/24/19 0402 12/25/19 0349 12/26/19 0417  NA 135 133* 132* 127* 129* 128* 123*  K 4.1 4.1 3.7 3.6 3.6 3.8 3.9  CL 100 98 97* 93* 91* 89* 86*  CO2 18* 18* 21* 21* 25 24 20*  GLUCOSE 227* 198* 151* 187* 169* 150* 120*  BUN 56* 73* 43* 60* 27* 46* 63*  CREATININE 4.56* 6.90* 5.55* 7.24* 4.61* 6.17* 7.43*  ALBUMIN 2.4* 2.0* 2.1* 2.0* 2.0* 2.0* 2.1*  CALCIUM 7.7* 8.1* 7.8* 7.8* 7.9* 7.9* 7.9*  PHOS 3.5 5.4* 5.6* 7.3* 6.0* 7.2* 8.4*   Liver Function Tests: Recent Labs  Lab 12/24/19 0402 12/25/19 0349 12/26/19 0417  ALBUMIN 2.0* 2.0* 2.1*   CBC: Recent Labs  Lab 12/22/19 0216 12/22/19 0216 12/23/19 0420 12/23/19 0420 12/24/19 0402 12/24/19 0402 12/25/19 0349  12/25/19 1705 12/26/19 0417  WBC 17.4*   < > 16.3*   < > 12.3*  --  11.2*  --  11.2*  NEUTROABS 13.6*   < > 13.1*   < > 9.6*  --  8.8*  --  8.8*  HGB 8.1*   < > 7.8*   < > 7.6*   < > 7.1* 9.4* 9.2*  HCT 24.6*   < > 24.1*   < > 24.1*   < > 22.8* 28.4* 28.0*  MCV 87.5  --  89.3  --  89.9  --  89.8  --  86.7  PLT 110*   < > 157   < > 180  --  220  --  270   < > = values in this interval not displayed.   Cardiac Enzymes: No results for input(s): CKTOTAL, CKMB, CKMBINDEX, TROPONINI in the last 168 hours. CBG: Recent Labs  Lab 12/25/19 0741 12/25/19 1215 12/25/19 1647 12/25/19 2053 12/26/19 0655  GLUCAP 135* 141* 86 105* 119*    Studies/Results: No results found. Marland Kitchen amiodarone  200 mg Oral Daily  . Chlorhexidine Gluconate Cloth  6 each Topical Daily  . feeding supplement (PRO-STAT SUGAR FREE 64)  30 mL Oral BID  . insulin aspart  0-5 Units Subcutaneous QHS  .  insulin aspart  0-9 Units Subcutaneous TID WC  . insulin glargine  14 Units Subcutaneous Daily  . multivitamin  1 tablet Oral QHS  . pantoprazole  40 mg Oral BID AC  . polyethylene glycol  17 g Oral Daily  . senna-docusate  1 tablet Oral BID  . sodium chloride flush  10-40 mL Intracatheter Q12H  . sucralfate  1 g Oral Q6H    BMET    Component Value Date/Time   NA 123 (L) 12/26/2019 0417   K 3.9 12/26/2019 0417   CL 86 (L) 12/26/2019 0417   CO2 20 (L) 12/26/2019 0417   GLUCOSE 120 (H) 12/26/2019 0417   BUN 63 (H) 12/26/2019 0417   CREATININE 7.43 (H) 12/26/2019 0417   CREATININE 1.03 10/20/2013 1751   CALCIUM 7.9 (L) 12/26/2019 0417   GFRNONAA 5 (L) 12/26/2019 0417   GFRAA 6 (L) 12/26/2019 0417   CBC    Component Value Date/Time   WBC 11.2 (H) 12/26/2019 0417   RBC 3.23 (L) 12/26/2019 0417   HGB 9.2 (L) 12/26/2019 0417   HCT 28.0 (L) 12/26/2019 0417   PLT 270 12/26/2019 0417   MCV 86.7 12/26/2019 0417   MCH 28.5 12/26/2019 0417   MCHC 32.9 12/26/2019 0417   RDW 15.7 (H) 12/26/2019 0417   LYMPHSABS 0.8  12/26/2019 0417   MONOABS 1.4 (H) 12/26/2019 0417   EOSABS 0.0 12/26/2019 0417   BASOSABS 0.1 12/26/2019 0417    Assessment/Plan:  1. AKI- in setting of sepsis and severe acute pancreatitis +/- IV contrast exposure. CRRT started on 12/15/19. Stopped 12/18/19. Remains oliguric/anuric.Had difficulty with HD2/17/21 with poorly functioningtrialysis catheter but worked better 12/23/19. - HD today, 2/22.  Assess dialysis needs daily  - Will continue to follow for renal recovery. - consulted IR for tunneled catheter placement; NPO after midnight tonight for procedure on 2/23 anticipated 2. Acute gallstone pacreatitis- s/p ERCP with biliary stent placement. On liquid diet 3. Anemia of critical illness and GIB- stable. Continue to follow and transfuse prn.  4. Atrial fibrillation with RVR-  Per primary team  5. Leukocytosis- stable. 6. Hyponatremia - follow after HD 7. HTN -increased UF goal. Acceptable on HD.   Claudia Desanctis, MD 12/26/2019 9:38 AM

## 2019-12-26 NOTE — Progress Notes (Signed)
Arrived to room for line care/ disconnect. Pt off the floor. Please re-enter consult if needed upon return.

## 2019-12-26 NOTE — Progress Notes (Addendum)
PROGRESS NOTE  Katherine Oliver Rachels WEX:[id number] DOB: 03-06-1946 DOA: 12/13/2019 PCP: [name], MD  HPI/Recap of past 24 hours: HPI from PCCM 74 y/o female admitted on 2/9 with gallstone pancreatitis and UTI.  Had ERCP on 2/10 with stent placement (technically difficult and complex), followed by worsening pancreatic pain, increased tachycardia, increased work of breathing and supplemental oxygen use, worsening AKI, PCCM consulted.  General surgery also consulted due to increased intrabladder pressure and concern for possible abdominal compartment syndrome.  On 2/12 patient went into A. fib with RVR, started on diltiazem, later became hypotensive and switched to amiodarone.  Patient also had an EGD done on 2/15 due to possible GI bleed.  EGD showed stress ulceration.  Due to some improvement in overall clinical status, [facility] hospitalist assumed care on 12/21/2019.     Today, met pt during dialysis, denies any new complaints.  Patient denies any chest pain, shortness breath, abdominal pain, nausea/vomiting.      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, without long-term current use of insulin (HCC)   HTN (hypertension)   Dyslipidemia associated with type 2 diabetes mellitus (HCC)   NAFLD (nonalcoholic fatty liver disease)   GERD (gastroesophageal reflux disease)   Polycythemia   Obesity, Class I, BMI 30-34.9   CKD stage 3 due to type 2 diabetes mellitus ([place])   Acute gallstone pancreatitis   Sepsis ([place])   Acute renal failure (ARF) ([place])   Acute respiratory failure (HCC)   Choledocholithiasis   Gallstone pancreatitis post ERCP with biliary stent placement Acute hepatitis Acute blood loss anemia/GI bleeding 2/2 gastric/stress ulcers Status post 3 units of PRBC, last transfusion of 1U of PRBC on 12/25/19 LFTs improved GI on board, continue supportive care and advance diet as tolerated Continue PPI,  Carafate Daily CBC  AKI with oliguria/anuric/hyponatremia In the setting of sepsis and severe acute pancreatitis Status post CRRT, started on 2/11, stopped on 2/14 Currently on HD (M/W/F) Nephrology on board Plan for [facility] on 12/27/19 by IR  Leukocytosis Remains afebrile, hemodynamically stable No obvious signs of infection Continue to monitor off antibiotics Daily CBC  A. fib with RVR Possibly likely related to sepsis Currently rate controlled Currently on oral amiodarone, may discontinue on discharge if continues to remain sinus rhythm  Thrombocytopenia Resolved Monitor closely for any signs of bleeding Daily CBC  Acute hypoxemic and hypercarbic respiratory failure Chest x-ray showed atelectasis Currently on supplemental oxygen, plan to wean off Continue incentive spirometry  Enterococcus UTI Completed antibiotics  Diabetes mellitus type 2 SSI, Lantus, Accu-Cheks, hypoglycemic protocol  Obesity Advise lifestyle modification  Physical deconditioning PT/OT Out of bed as tolerated        Malnutrition Type:  Nutrition Problem: Inadequate oral intake Etiology: altered GI function, acute illness   Malnutrition Characteristics:  Signs/Symptoms: NPO status   Nutrition Interventions:  Interventions: Refer to RD note for recommendations    Estimated body mass index is 33.93 kg/m as calculated from the following:   Height as of this encounter: 5' 0.98" (1.549 m).   Weight as of this encounter: 81.4 kg.     Code Status: Full  Family Communication: None at bedside  Disposition Plan: Once significant clinical improvement, consultant signing off, likely home   Consultants:  PCCM  GI  Nephrology  IR  Procedures:  ERCP on 2/10  Antimicrobials:  None  DVT prophylaxis: SCD, due to GI bleed   Objective: Vitals:   12/26/19 1030 12/26/19 1100 12/26/19 1123  12/26/19 1227  BP: 137/61 (!) 141/64 (!) 120/103 (!) 147/57  Pulse: 94 96 100 (!)  103  Resp: '12 14 15 18  '$ Temp:   99.4 F (37.4 C) 97.8 F (36.6 C)  TempSrc:   Axillary Oral  SpO2:   100% 95%  Weight:   81.4 kg   Height:        Intake/Output Summary (Last 24 hours) at 12/26/2019 1344 Last data filed at 12/26/2019 1123 Gross per 24 hour  Intake 916 ml  Output 1850 ml  Net -934 ml   Filed Weights   12/23/19 0700 12/23/19 1048 12/26/19 1123  Weight: 80.8 kg 79.6 kg 81.4 kg    Exam:  General: NAD, deconditioned    Cardiovascular: S1, S2 present  Respiratory: CTAB  Abdomen: Soft, nontender, nondistended, bowel sounds present  Musculoskeletal: No bilateral pedal edema noted  Skin: Normal  Psychiatry: Normal mood   Data Reviewed: CBC: Recent Labs  Lab 12/22/19 0216 12/22/19 0216 12/23/19 0420 12/24/19 0402 12/25/19 0349 12/25/19 1705 12/26/19 0417  WBC 17.4*  --  16.3* 12.3* 11.2*  --  11.2*  NEUTROABS 13.6*  --  13.1* 9.6* 8.8*  --  8.8*  HGB 8.1*   < > 7.8* 7.6* 7.1* 9.4* 9.2*  HCT 24.6*   < > 24.1* 24.1* 22.8* 28.4* 28.0*  MCV 87.5  --  89.3 89.9 89.8  --  86.7  PLT 110*  --  157 180 220  --  270   < > = values in this interval not displayed.   Basic Metabolic Panel: Recent Labs  Lab 12/20/19 0405 12/21/19 6834 12/22/19 0216 12/23/19 0420 12/24/19 0402 12/25/19 0349 12/26/19 0417  NA 135   < > 132* 127* 129* 128* 123*  K 4.1   < > 3.7 3.6 3.6 3.8 3.9  CL 100   < > 97* 93* 91* 89* 86*  CO2 18*   < > 21* 21* 25 24 20*  GLUCOSE 227*   < > 151* 187* 169* 150* 120*  BUN 56*   < > 43* 60* 27* 46* 63*  CREATININE 4.56*   < > 5.55* 7.24* 4.61* 6.17* 7.43*  CALCIUM 7.7*   < > 7.8* 7.8* 7.9* 7.9* 7.9*  MG 2.4  --   --   --   --   --   --   PHOS 3.5   < > 5.6* 7.3* 6.0* 7.2* 8.4*   < > = values in this interval not displayed.   GFR: Estimated Creatinine Clearance: 6.3 mL/min (A) (by C-G formula based on SCr of 7.43 mg/dL (H)). Liver Function Tests: Recent Labs  Lab 12/22/19 0216 12/23/19 0420 12/24/19 0402 12/25/19 0349  12/26/19 0417  ALBUMIN 2.1* 2.0* 2.0* 2.0* 2.1*   No results for input(s): LIPASE, AMYLASE in the last 168 hours. No results for input(s): AMMONIA in the last 168 hours. Coagulation Profile: No results for input(s): INR, PROTIME in the last 168 hours. Cardiac Enzymes: No results for input(s): CKTOTAL, CKMB, CKMBINDEX, TROPONINI in the last 168 hours. BNP (last 3 results) Recent Labs    12/29/18 1531  PROBNP 26.0   HbA1C: No results for input(s): HGBA1C in the last 72 hours. CBG: Recent Labs  Lab 12/25/19 1215 12/25/19 1647 12/25/19 2053 12/26/19 0655 12/26/19 1224  GLUCAP 141* 86 105* 119* 106*   Lipid Profile: No results for input(s): CHOL, HDL, LDLCALC, TRIG, CHOLHDL, LDLDIRECT in the last 72 hours. Thyroid Function Tests: No results for input(s): TSH, T4TOTAL,  FREET4, T3FREE, THYROIDAB in the last 72 hours. Anemia Panel: No results for input(s): VITAMINB12, FOLATE, FERRITIN, TIBC, IRON, RETICCTPCT in the last 72 hours. Urine analysis:    Component Value Date/Time   COLORURINE AMBER (A) 12/15/2019 0920   APPEARANCEUR CLOUDY (A) 12/15/2019 0920   APPEARANCEUR Cloudy (A) 12/12/2019 1332   LABSPEC 1.035 (H) 12/15/2019 0920   PHURINE 5.0 12/15/2019 0920   GLUCOSEU 50 (A) 12/15/2019 0920   HGBUR LARGE (A) 12/15/2019 0920   BILIRUBINUR NEGATIVE 12/15/2019 0920   BILIRUBINUR Negative 12/12/2019 1332   KETONESUR 5 (A) 12/15/2019 0920   PROTEINUR 100 (A) 12/15/2019 0920   UROBILINOGEN 0.2 02/12/2018 0805   NITRITE NEGATIVE 12/15/2019 0920   LEUKOCYTESUR NEGATIVE 12/15/2019 0920   Sepsis Labs: '@LABRCNTIP'$ (procalcitonin:4,lacticidven:4)  ) No results found for this or any previous visit (from the past 240 hour(s)).    Studies: No results found.  Scheduled Meds: . amiodarone  200 mg Oral Daily  . Chlorhexidine Gluconate Cloth  6 each Topical Daily  . feeding supplement (PRO-STAT SUGAR FREE 64)  30 mL Oral BID  . insulin aspart  0-5 Units Subcutaneous QHS  .  insulin aspart  0-9 Units Subcutaneous TID WC  . insulin glargine  14 Units Subcutaneous Daily  . multivitamin  1 tablet Oral QHS  . pantoprazole  40 mg Oral BID AC  . polyethylene glycol  17 g Oral Daily  . senna-docusate  1 tablet Oral BID  . sodium chloride flush  10-40 mL Intracatheter Q12H  . sucralfate  1 g Oral Q6H    Continuous Infusions: . sodium chloride 10 mL/hr at 12/25/19 1611     LOS: 13 days     Alma Friendly, MD [facility]  If 7PM-7AM, please contact night-coverage [email or web address] 12/26/2019, 1:44 PM

## 2019-12-26 NOTE — Progress Notes (Signed)
Occupational Therapy Treatment Patient Details Name: Katherine Oliver MRN: [id number] DOB: April 15, 1946 Today's Date: 12/26/2019    History of present illness 74 yo admitted 2/9 with pancreatitis and UTI s/p ERCP with Afib 2/12. PMHx: DM, HTN, CKD, obesity, sepsis, ARF, Rt TKA   OT comments  Upon arrival, pt resting, supine in bed. Pt reporting she is very fatigued after HD and being NPO all day. Pt agreeable to grooming at bed level. Pt repositioning in bed with Min A to optimize more upright posture. Pt performing grooming with set up. Continue to recommend dc to home with HHOT and will continue to follow acutely as admitted.    Follow Up Recommendations  Home health OT;Supervision - Intermittent    Equipment Recommendations  3 in 1 bedside commode    Recommendations for Other Services      Precautions / Restrictions Precautions Precautions: Fall       Mobility Bed Mobility Overal bed mobility: Needs Assistance             General bed mobility comments: Min A for repositioning. Pt grabbing bedrail at [facility] and pushing with BLEs to move towards [place]. Pt also grabbing side rails to then pull into long sitting for repositioning of pillows  Transfers                 General transfer comment: Pt declined OOB activity    Balance                                           ADL either performed or assessed with clinical judgement   ADL Overall ADL's : Needs assistance/impaired     Grooming: Oral care;Brushing hair;Wash/dry face;Set up;Bed level Grooming Details (indicate cue type and reason): Pt participating in grooming at bed level. Very appreciative and stating "I feel a little more human." Declining to sit at EOB or go to bathroom for grooming                               General ADL Comments: Pt reporting she is very fatigued from HD this morning. Agreeable to grooming at [employer]      Praxis      Cognition Arousal/Alertness: Awake/alert Behavior During Therapy: WFL for tasks assessed/performed Overall Cognitive Status: Within Functional Limits for tasks assessed                                 General Comments: Pt reporting she is very fatigued from HD this morning        Exercises     Shoulder Instructions       General Comments SpO2 90s on 2L    Pertinent Vitals/ Pain       Pain Assessment: No/denies pain  Home Living                                          Prior Functioning/Environment              Frequency  Min 2X/week        Progress Toward Goals  OT Goals(current goals  can now be found in the care plan section)  Progress towards OT goals: Progressing toward goals(Limited this session by fatigue)  Acute Rehab OT Goals Patient Stated Goal: return home OT Goal Formulation: With patient Time For Goal Achievement: 01/05/20 Potential to Achieve Goals: Good ADL Goals Pt Will Perform Grooming: with modified independence;sitting;standing Pt Will Perform Upper Body Dressing: with modified independence;sitting Pt Will Perform Lower Body Dressing: with modified independence;sit to/from stand;sitting/lateral leans;with adaptive equipment Pt Will Transfer to Toilet: with modified independence;ambulating;grab bars;regular height toilet(RW) Pt Will Perform Toileting - Clothing Manipulation and hygiene: with modified independence;sitting/lateral leans;sit to/from stand  Plan Discharge plan remains appropriate    Co-evaluation                 AM-PAC OT "6 Clicks" Daily Activity     Outcome Measure   Help from another person eating meals?: None Help from another person taking care of personal grooming?: A Little Help from another person toileting, which includes using toliet, bedpan, or urinal?: A Little Help from another person bathing (including washing, rinsing, drying)?: A Lot Help from another  person to put on and taking off regular upper body clothing?: A Little Help from another person to put on and taking off regular lower body clothing?: A Lot 6 Click Score: 17    End of Session Equipment Utilized During Treatment: Oxygen  OT Visit Diagnosis: Other abnormalities of gait and mobility (R26.89);Pain Pain - Right/Left: Left   Activity Tolerance Patient limited by fatigue   Patient Left in bed;with call bell/phone within reach   Nurse Communication Other (comment)(Fatigue)        Time: 2563-8937 OT Time Calculation (min): 15 min  Charges: OT General Charges $OT Visit: 1 Visit OT Treatments $Self Care/Home Management : 8-22 mins  [place], OTR/L Acute Rehab Pager: [phone] Office: [place] 12/26/2019, 4:35 PM

## 2019-12-26 NOTE — Progress Notes (Signed)
PT Cancellation Note  Patient Details Name: Evolett Somarriba MRN: [id number] DOB: 1946/05/08   Cancelled Treatment:    Reason Eval/Treat Not Completed: Patient at procedure or test/unavailable HD   Ireoluwa Grant B Braidyn Scorsone 12/26/2019, 8:47 AM  [name], PT Acute Rehabilitation Services Pager: [phone] Office: [phone]

## 2019-12-26 NOTE — Progress Notes (Signed)
OT Cancellation Note  Patient Details Name: Katherine Oliver MRN: [id number] DOB: [birth year]   Cancelled Treatment:    Reason Eval/Treat Not Completed: Patient at procedure or test/ unavailable(HD. Will return as schedule allows.)  [place], OTR/L Acute Rehab Pager: [phone] Office: [phone] 12/26/2019, 8:36 AM

## 2019-12-27 ENCOUNTER — Inpatient Hospital Stay (HOSPITAL_COMMUNITY): Payer: Medicare Other

## 2019-12-27 HISTORY — PX: IR FLUORO GUIDE CV LINE RIGHT: IMG2283

## 2019-12-27 LAB — RENAL FUNCTION PANEL
Albumin: 2 g/dL — ABNORMAL LOW (ref 3.5–5.0)
Anion gap: 15 (ref 5–15)
BUN: 34 mg/dL — ABNORMAL HIGH (ref 8–23)
CO2: 23 mmol/L (ref 22–32)
Calcium: 8 mg/dL — ABNORMAL LOW (ref 8.9–10.3)
Chloride: 92 mmol/L — ABNORMAL LOW (ref 98–111)
Creatinine, Ser: 5.29 mg/dL — ABNORMAL HIGH (ref 0.44–1.00)
GFR calc Af Amer: 9 mL/min — ABNORMAL LOW (ref >60)
GFR calc non Af Amer: 7 mL/min — ABNORMAL LOW (ref >60)
Glucose, Bld: 130 mg/dL — ABNORMAL HIGH (ref 70–99)
Phosphorus: 5.9 mg/dL — ABNORMAL HIGH (ref 2.5–4.6)
Potassium: 3.6 mmol/L (ref 3.5–5.1)
Sodium: 130 mmol/L — ABNORMAL LOW (ref 135–145)

## 2019-12-27 LAB — CBC WITH DIFFERENTIAL/PLATELET
Abs Immature Granulocytes: 0.16 10*3/uL — ABNORMAL HIGH (ref 0.00–0.07)
Basophils Absolute: 0 10*3/uL (ref 0.0–0.1)
Basophils Relative: 1 %
Eosinophils Absolute: 0 10*3/uL (ref 0.0–0.5)
Eosinophils Relative: 0 %
HCT: 26 % — ABNORMAL LOW (ref 36.0–46.0)
Hemoglobin: 8.5 g/dL — ABNORMAL LOW (ref 12.0–15.0)
Immature Granulocytes: 2 %
Lymphocytes Relative: 9 %
Lymphs Abs: 0.7 10*3/uL (ref 0.7–4.0)
MCH: 28.6 pg (ref 26.0–34.0)
MCHC: 32.7 g/dL (ref 30.0–36.0)
MCV: 87.5 fL (ref 80.0–100.0)
Monocytes Absolute: 1.2 10*3/uL — ABNORMAL HIGH (ref 0.1–1.0)
Monocytes Relative: 14 %
Neutro Abs: 6.1 10*3/uL (ref 1.7–7.7)
Neutrophils Relative %: 74 %
Platelets: 245 10*3/uL (ref 150–400)
RBC: 2.97 MIL/uL — ABNORMAL LOW (ref 3.87–5.11)
RDW: 15.8 % — ABNORMAL HIGH (ref 11.5–15.5)
WBC: 8.3 10*3/uL (ref 4.0–10.5)
nRBC: 0 % (ref 0.0–0.2)

## 2019-12-27 LAB — GLUCOSE, CAPILLARY
Glucose-Capillary: 125 mg/dL — ABNORMAL HIGH (ref 70–99)
Glucose-Capillary: 143 mg/dL — ABNORMAL HIGH (ref 70–99)
Glucose-Capillary: 157 mg/dL — ABNORMAL HIGH (ref 70–99)
Glucose-Capillary: 144 mg/dL — ABNORMAL HIGH (ref 70–99)

## 2019-12-27 MED ORDER — CEFAZOLIN SODIUM-DEXTROSE 2-4 GM/100ML-% IV SOLN
INTRAVENOUS | Status: AC
  Filled 2019-12-27: qty 100

## 2019-12-27 MED ORDER — FENTANYL CITRATE (PF) 100 MCG/2ML IJ SOLN
INTRAMUSCULAR | Status: AC
  Filled 2019-12-27: qty 2

## 2019-12-27 MED ORDER — CHLORHEXIDINE GLUCONATE CLOTH 2 % EX PADS
6.0000 | MEDICATED_PAD | Freq: Every day | CUTANEOUS | Status: DC
  Administered 2019-12-28 – 2020-01-03 (×6): 6 via TOPICAL

## 2019-12-27 MED ORDER — LIDOCAINE HCL 1 % IJ SOLN
INTRAMUSCULAR | Status: AC
  Filled 2019-12-27: qty 20

## 2019-12-27 MED ORDER — FENTANYL CITRATE (PF) 100 MCG/2ML IJ SOLN
INTRAMUSCULAR | Status: AC | PRN
  Administered 2019-12-27: 50 ug via INTRAVENOUS

## 2019-12-27 MED ORDER — HEPARIN SODIUM (PORCINE) 1000 UNIT/ML IJ SOLN
INTRAMUSCULAR | Status: AC
  Administered 2019-12-27: 10:00:00 3.8 mL
  Filled 2019-12-27: qty 1

## 2019-12-27 MED ORDER — DARBEPOETIN ALFA 40 MCG/0.4ML IJ SOSY
40.0000 ug | PREFILLED_SYRINGE | Freq: Once | INTRAMUSCULAR | Status: AC
  Administered 2019-12-27: 40 ug via SUBCUTANEOUS
  Filled 2019-12-27: qty 0.4

## 2019-12-27 MED ORDER — DIPHENHYDRAMINE HCL 25 MG PO CAPS
25.0000 mg | ORAL_CAPSULE | Freq: Every evening | ORAL | Status: DC | PRN
  Administered 2019-12-27 – 2020-01-02 (×6): 25 mg via ORAL
  Filled 2019-12-27 (×6): qty 1

## 2019-12-27 MED ORDER — MIDAZOLAM HCL 2 MG/2ML IJ SOLN
INTRAMUSCULAR | Status: AC | PRN
  Administered 2019-12-27: 1 mg via INTRAVENOUS

## 2019-12-27 MED ORDER — LIDOCAINE HCL (PF) 2 % IJ SOLN
INTRAMUSCULAR | Status: AC | PRN
  Administered 2019-12-27: 10 mL

## 2019-12-27 MED ORDER — FLUTICASONE PROPIONATE 50 MCG/ACT NA SUSP
1.0000 | Freq: Every day | NASAL | Status: DC
  Administered 2019-12-27 – 2020-01-03 (×8): 1 via NASAL
  Filled 2019-12-27: qty 16

## 2019-12-27 MED ORDER — NEPRO/CARBSTEADY PO LIQD
237.0000 mL | Freq: Three times a day (TID) | ORAL | Status: DC
  Administered 2019-12-27 – 2020-01-01 (×3): 237 mL via ORAL

## 2019-12-27 MED ORDER — MIDAZOLAM HCL 2 MG/2ML IJ SOLN
INTRAMUSCULAR | Status: AC
  Filled 2019-12-27: qty 2

## 2019-12-27 MED ORDER — CEFAZOLIN SODIUM-DEXTROSE 2-4 GM/100ML-% IV SOLN
2.0000 g | Freq: Once | INTRAVENOUS | Status: AC
  Administered 2019-12-27: 2 g via INTRAVENOUS
  Filled 2019-12-27: qty 100

## 2019-12-27 NOTE — Procedures (Signed)
Interventional Radiology Procedure Note  Procedure: Tunneled HD catheter placement  Complications: None  Estimated Blood Loss: < 10 mL  Findings: 23 cm tip to cuff length Palindrome catheter placed via left IJ with tip in RA. OK to use.  Venetia Night. Kathlene Cote, M.D Pager:  [phone]

## 2019-12-27 NOTE — Progress Notes (Addendum)
PROGRESS NOTE  Katherine Oliver [place] UMP:[id number] DOB: 1946/05/29 DOA: 12/13/2019 PCP: [name], MD  HPI/Recap of past 24 hours: HPI from PCCM 74 y/o female admitted on 2/9 with gallstone pancreatitis and UTI.  Had ERCP on 2/10 with stent placement (technically difficult and complex), followed by worsening pancreatic pain, increased tachycardia, increased work of breathing and supplemental oxygen use, worsening AKI, PCCM consulted.  General surgery also consulted due to increased intrabladder pressure and concern for possible abdominal compartment syndrome.  On 2/12 patient went into A. fib with RVR, started on diltiazem, later became hypotensive and switched to amiodarone.  Patient also had an EGD done on 2/15 due to possible GI bleed.  EGD showed stress ulceration.  Due to some improvement in overall clinical status, [facility] hospitalist assumed care on 12/21/2019.     Today, pt denies any new complaints, reports some nasal congestion.      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, without long-term current use of insulin (HCC)   HTN (hypertension)   Dyslipidemia associated with type 2 diabetes mellitus (HCC)   NAFLD (nonalcoholic fatty liver disease)   GERD (gastroesophageal reflux disease)   Polycythemia   Obesity, Class I, BMI 30-34.9   CKD stage 3 due to type 2 diabetes mellitus ([place])   Acute gallstone pancreatitis   Sepsis ([place])   Acute renal failure (ARF) ([place])   Acute respiratory failure (HCC)   Choledocholithiasis   Gallstone pancreatitis post ERCP with biliary stent placement Acute hepatitis Acute blood loss anemia/GI bleeding 2/2 gastric/stress ulcers Status post 3 units of PRBC, last transfusion of 1U of PRBC on 12/25/19 LFTs improved GI on board, continue supportive care and advance diet as tolerated Continue PPI, Carafate Daily CBC  AKI with oliguria/anuric/hyponatremia In the setting of sepsis  and severe acute pancreatitis Status post CRRT, started on 2/11, stopped on 2/14 Currently on HD (M/W/F) Nephrology on board S/P TDC on 12/27/19 by IR  Leukocytosis Remains afebrile, hemodynamically stable No obvious signs of infection Continue to monitor off antibiotics Daily CBC  New onset A. fib with RVR Possibly likely related to sepsis Currently rate controlled Currently on oral amiodarone, may discontinue on discharge if continues to remain sinus rhythm  Thrombocytopenia Resolved Monitor closely for any signs of bleeding Daily CBC  Acute hypoxemic and hypercarbic respiratory failure Chest x-ray showed atelectasis Currently on supplemental oxygen, plan to wean off Continue incentive spirometry  Enterococcus UTI Completed antibiotics  Diabetes mellitus type 2 SSI, Lantus, Accu-Cheks, hypoglycemic protocol  Obesity Advise lifestyle modification  Physical deconditioning PT/OT Out of bed as tolerated        Malnutrition Type:  Nutrition Problem: Inadequate oral intake Etiology: altered GI function, acute illness   Malnutrition Characteristics:  Signs/Symptoms: NPO status   Nutrition Interventions:  Interventions: Refer to RD note for recommendations    Estimated body mass index is 33.93 kg/m as calculated from the following:   Height as of this encounter: 5' 0.98" (1.549 m).   Weight as of this encounter: 81.4 kg.     Code Status: Full  Family Communication: Discussed with husband at bedside  Disposition Plan: Once significant clinical improvement, consultant signing off, likely home   Consultants:  PCCM  GI  Nephrology  IR  Procedures:  ERCP on 2/10  Antimicrobials:  None  DVT prophylaxis: SCD, due to GI bleed   Objective: Vitals:   12/27/19 0920 12/27/19 0925 12/27/19 0945 12/27/19 1007  BP: (!) 151/70 Marland Kitchen)  155/66 (!) 151/60 (!) 143/64  Pulse: (!) 103 97 99 97  Resp: 13 13 14 14   Temp:    98.9 F (37.2 C)    TempSrc:    Axillary  SpO2: 96% 95% 95% 100%  Weight:      Height:        Intake/Output Summary (Last 24 hours) at 12/27/2019 1246 Last data filed at 12/27/2019 1610 Gross per 24 hour  Intake 240 ml  Output 100 ml  Net 140 ml   Filed Weights   12/23/19 0700 12/23/19 1048 12/26/19 1123  Weight: 80.8 kg 79.6 kg 81.4 kg    Exam:  General: NAD, deconditioned    Cardiovascular: S1, S2 present  Respiratory: CTAB  Abdomen: Soft, nontender, nondistended, bowel sounds present  Musculoskeletal: No bilateral pedal edema noted  Skin: Normal  Psychiatry: Normal mood   Data Reviewed: CBC: Recent Labs  Lab 12/23/19 0420 12/23/19 0420 12/24/19 0402 12/25/19 0349 12/25/19 1705 12/26/19 0417 12/27/19 0405  WBC 16.3*  --  12.3* 11.2*  --  11.2* 8.3  NEUTROABS 13.1*  --  9.6* 8.8*  --  8.8* 6.1  HGB 7.8*   < > 7.6* 7.1* 9.4* 9.2* 8.5*  HCT 24.1*   < > 24.1* 22.8* 28.4* 28.0* 26.0*  MCV 89.3  --  89.9 89.8  --  86.7 87.5  PLT 157  --  180 220  --  270 245   < > = values in this interval not displayed.   Basic Metabolic Panel: Recent Labs  Lab 12/23/19 0420 12/24/19 0402 12/25/19 0349 12/26/19 0417 12/27/19 0405  NA 127* 129* 128* 123* 130*  K 3.6 3.6 3.8 3.9 3.6  CL 93* 91* 89* 86* 92*  CO2 21* 25 24 20* 23  GLUCOSE 187* 169* 150* 120* 130*  BUN 60* 27* 46* 63* 34*  CREATININE 7.24* 4.61* 6.17* 7.43* 5.29*  CALCIUM 7.8* 7.9* 7.9* 7.9* 8.0*  PHOS 7.3* 6.0* 7.2* 8.4* 5.9*   GFR: Estimated Creatinine Clearance: 8.9 mL/min (A) (by C-G formula based on SCr of 5.29 mg/dL (H)). Liver Function Tests: Recent Labs  Lab 12/23/19 0420 12/24/19 0402 12/25/19 0349 12/26/19 0417 12/27/19 0405  ALBUMIN 2.0* 2.0* 2.0* 2.1* 2.0*   No results for input(s): LIPASE, AMYLASE in the last 168 hours. No results for input(s): AMMONIA in the last 168 hours. Coagulation Profile: No results for input(s): INR, PROTIME in the last 168 hours. Cardiac Enzymes: No results for  input(s): CKTOTAL, CKMB, CKMBINDEX, TROPONINI in the last 168 hours. BNP (last 3 results) Recent Labs    12/29/18 1531  PROBNP 26.0   HbA1C: No results for input(s): HGBA1C in the last 72 hours. CBG: Recent Labs  Lab 12/26/19 1224 12/26/19 1707 12/26/19 2126 12/27/19 0748 12/27/19 1136  GLUCAP 106* 166* 137* 125* 143*   Lipid Profile: No results for input(s): CHOL, HDL, LDLCALC, TRIG, CHOLHDL, LDLDIRECT in the last 72 hours. Thyroid Function Tests: No results for input(s): TSH, T4TOTAL, FREET4, T3FREE, THYROIDAB in the last 72 hours. Anemia Panel: No results for input(s): VITAMINB12, FOLATE, FERRITIN, TIBC, IRON, RETICCTPCT in the last 72 hours. Urine analysis:    Component Value Date/Time   COLORURINE AMBER (A) 12/15/2019 0920   APPEARANCEUR CLOUDY (A) 12/15/2019 0920   APPEARANCEUR Cloudy (A) 12/12/2019 1332   LABSPEC 1.035 (H) 12/15/2019 0920   PHURINE 5.0 12/15/2019 0920   GLUCOSEU 50 (A) 12/15/2019 0920   HGBUR LARGE (A) 12/15/2019 0920   BILIRUBINUR NEGATIVE 12/15/2019 0920   BILIRUBINUR  Negative 12/12/2019 1332   KETONESUR 5 (A) 12/15/2019 0920   PROTEINUR 100 (A) 12/15/2019 0920   UROBILINOGEN 0.2 02/12/2018 0805   NITRITE NEGATIVE 12/15/2019 0920   LEUKOCYTESUR NEGATIVE 12/15/2019 0920   Sepsis Labs: @LABRCNTIP (procalcitonin:4,lacticidven:4)  ) No results found for this or any previous visit (from the past 240 hour(s)).    Studies: IR Fluoro Guide CV Line Right  Result Date: 12/27/2019 CLINICAL DATA:  Renal failure and need for tunneled hemodialysis catheter. Current right jugular temporary non tunneled dialysis catheter in place. This is needed currently for IV access and conscious sedation for the tunnel catheter placement. EXAM: TUNNELED CENTRAL VENOUS HEMODIALYSIS CATHETER PLACEMENT WITH ULTRASOUND AND FLUOROSCOPIC GUIDANCE ANESTHESIA/SEDATION: 1.0 mg IV Versed; 50 mcg IV Fentanyl. Total Moderate Sedation Time:   23 minutes. The patient's level of  consciousness and physiologic status were continuously monitored during the procedure by Radiology nursing. MEDICATIONS: 2 g IV Ancef. FLUOROSCOPY TIME:  30 seconds.  3.0 mGy. PROCEDURE: The procedure, risks, benefits, and alternatives were explained to the patient. Questions regarding the procedure were encouraged and answered. The patient understands and consents to the procedure. A timeout was performed prior to initiating the procedure. Ultrasound was used to confirm patency of the left internal jugular vein. The left neck and chest were prepped with chlorhexidine in a sterile fashion, and a sterile drape was applied covering the operative field. Maximum barrier sterile technique with sterile gowns and gloves were used for the procedure. Local anesthesia was provided with 1% lidocaine. After creating a small venotomy incision, a 21 gauge needle was advanced into the left internal jugular vein under direct, real-time ultrasound guidance. Ultrasound image documentation was performed. After securing guidewire access, an 8 Fr dilator was placed. A J-wire was kinked to measure appropriate catheter length. A Palindrome tunneled hemodialysis catheter measuring 23 cm from tip to cuff was chosen for placement. This was tunneled in a retrograde fashion from the chest wall to the venotomy incision. At the venotomy, serial dilatation was performed and a 16 Fr peel-away sheath was placed over a guidewire. The catheter was then placed through the sheath and the sheath removed. Final catheter positioning was confirmed and documented with a fluoroscopic spot image. The catheter was aspirated, flushed with saline, and injected with appropriate volume heparin dwells. The venotomy incision was closed with subcuticular 4-0 Vicryl. Dermabond was applied to the incision. The catheter exit site was secured with 0-Prolene retention sutures. COMPLICATIONS: None.  No pneumothorax. FINDINGS: After catheter placement, the tip lies in the  right atrium. The catheter aspirates normally and is ready for immediate use. IMPRESSION: Placement of tunneled hemodialysis catheter via the left internal jugular vein. The catheter tip lies in the right atrium. The catheter is ready for immediate use. Electronically Signed   By: Aletta Edouard M.D.   On: 12/27/2019 10:13    Scheduled Meds: . amiodarone  200 mg Oral Daily  . Chlorhexidine Gluconate Cloth  6 each Topical Daily  . feeding supplement (PRO-STAT SUGAR FREE 64)  30 mL Oral BID  . fentaNYL      . insulin aspart  0-5 Units Subcutaneous QHS  . insulin aspart  0-9 Units Subcutaneous TID WC  . insulin glargine  14 Units Subcutaneous Daily  . lidocaine      . midazolam      . multivitamin  1 tablet Oral QHS  . pantoprazole  40 mg Oral BID AC  . polyethylene glycol  17 g Oral Daily  . senna-docusate  1 tablet Oral BID  . sodium chloride flush  10-40 mL Intracatheter Q12H  . sucralfate  1 g Oral Q6H    Continuous Infusions: . sodium chloride 10 mL/hr at 12/25/19 1611  . ceFAZolin       LOS: 14 days     Alma Friendly, MD [facility]  If 7PM-7AM, please contact night-coverage [email or web address] 12/27/2019, 12:46 PM

## 2019-12-27 NOTE — Progress Notes (Signed)
Physical Therapy Treatment Patient Details Name: Katherine Oliver MRN: [id number] DOB: 02/04/1946 Today's Date: 12/27/2019    History of Present Illness 74 yo admitted 2/9 with pancreatitis and UTI s/p ERCP with Afib 2/12. PMHx: DM, HTN, CKD, obesity, sepsis, ARF, Rt TKA    PT Comments    Pt agreeable to mobility post IR and reports no pain. Pt with SpO2 92% on RA at rest with drop to 89% with activity with HR 103-120. Pt educated for IS with pt able to achieve 750 x 2 of 8 trials and encouraged for hourly use. Pt's lunch arrived during session and pt declined HEP in order to eat after NPO status. Will continue to follow and encouraged increased mobility with staff.     Follow Up Recommendations  Home health PT;Supervision for mobility/OOB     Equipment Recommendations  None recommended by PT    Recommendations for Other Services       Precautions / Restrictions Precautions Precautions: Fall Precaution Comments: watch O2    Mobility  Bed Mobility Overal bed mobility: Modified Independent Bed Mobility: Supine to Sit           General bed mobility comments: increased time with HOB 25 degrees and use of rail  Transfers Overall transfer level: Needs assistance   Transfers: Sit to/from Stand Sit to Stand: Supervision         General transfer comment: cues for hand placement  Ambulation/Gait Ambulation/Gait assistance: Supervision Gait Distance (Feet): 100 Feet Assistive device: Rolling walker (2 wheeled) Gait Pattern/deviations: Step-through pattern;Decreased stride length   Gait velocity interpretation: >2.62 ft/sec, indicative of community ambulatory General Gait Details: SpO2 89% on RA with gait with pt self limiting distance reporting fatigue   Stairs             Wheelchair Mobility    Modified Rankin (Stroke Patients Only)       Balance Overall balance assessment: Mild deficits observed, not formally tested                                          Cognition Arousal/Alertness: Awake/alert Behavior During Therapy: WFL for tasks assessed/performed Overall Cognitive Status: Within Functional Limits for tasks assessed                                        Exercises      General Comments        Pertinent Vitals/Pain Pain Assessment: No/denies pain    Home Living                      Prior Function            PT Goals (current goals can now be found in the care plan section) Progress towards PT goals: Progressing toward goals    Frequency           PT Plan Current plan remains appropriate    Co-evaluation              AM-PAC PT "6 Clicks" Mobility   Outcome Measure  Help needed turning from your back to your side while in a flat bed without using bedrails?: None Help needed moving from lying on your back to sitting on the side of a flat bed without using bedrails?:  A Little Help needed moving to and from a bed to a chair (including a wheelchair)?: A Little Help needed standing up from a chair using your arms (e.g., wheelchair or bedside chair)?: A Little Help needed to walk in hospital room?: A Little Help needed climbing 3-5 steps with a railing? : A Little 6 Click Score: 19    End of Session Equipment Utilized During Treatment: Gait belt Activity Tolerance: Patient tolerated treatment well;Patient limited by fatigue Patient left: in chair;with call bell/phone within reach;with chair alarm set Nurse Communication: Mobility status PT Visit Diagnosis: Other abnormalities of gait and mobility (R26.89)     Time: 3582-5189 PT Time Calculation (min) (ACUTE ONLY): 14 min  Charges:  $Gait Training: 8-22 mins                     [place] Pager: [phone] Office: [place] 12/27/2019, 1:10 PM

## 2019-12-27 NOTE — Progress Notes (Signed)
Nephrology Progress Note:   Patient ID: Katherine Oliver, female   DOB: 29-Jul-1946, 74 y.o.   MRN: [id number]    S: Last HD on 2/22 with 1.6 kg UF.  She had tunneled catheter placed with IR on 2/23.  Had 100 mL uop oer 2/22.  She states no hx of malignancy -she has a spot on lungs which she states is "the same for 30 years -they think is scar tissue".  We discussed r/b/i of ESA and she is willing to receive ESA  Review of systems   Shortness of breath better after HD - still there but improved Continues with nausea   Not much urine   O:BP (!) 143/64 (BP Location: Right Arm)   Pulse 97   Temp 98.9 F (37.2 C) (Axillary)   Resp 14   Ht 5' 0.98" (1.549 m)   Wt 81.4 kg   SpO2 100%   BMI 33.93 kg/m   Intake/Output Summary (Last 24 hours) at 12/27/2019 1233 Last data filed at 12/27/2019 0086 Gross per 24 hour  Intake 240 ml  Output 100 ml  Net 140 ml   Intake/Output: I/O last 3 completed shifts: In: 720 [P.O.:720] Out: 1800 [Urine:250; Other:1550]  Intake/Output this shift:  No intake/output data recorded. Weight change:    Physical exam:   Gen: adult female NAD CVS: S1S2 no rub Resp: cta but reduced unlabored; on room air  Abd: soft, nontender; obese habitus Ext: trace if any edema Neuro alert and oriented x3 ; provides hx and follows commands  Access right IJ nontunneled catheter; left chest tunneled catheter    Recent Labs  Lab 12/21/19 0819 12/22/19 0216 12/23/19 0420 12/24/19 0402 12/25/19 0349 12/26/19 0417 12/27/19 0405  NA 133* 132* 127* 129* 128* 123* 130*  K 4.1 3.7 3.6 3.6 3.8 3.9 3.6  CL 98 97* 93* 91* 89* 86* 92*  CO2 18* 21* 21* 25 24 20* 23  GLUCOSE 198* 151* 187* 169* 150* 120* 130*  BUN 73* 43* 60* 27* 46* 63* 34*  CREATININE 6.90* 5.55* 7.24* 4.61* 6.17* 7.43* 5.29*  ALBUMIN 2.0* 2.1* 2.0* 2.0* 2.0* 2.1* 2.0*  CALCIUM 8.1* 7.8* 7.8* 7.9* 7.9* 7.9* 8.0*  PHOS 5.4* 5.6* 7.3* 6.0* 7.2* 8.4* 5.9*   Liver Function Tests: Recent Labs   Lab 12/25/19 0349 12/26/19 0417 12/27/19 0405  ALBUMIN 2.0* 2.1* 2.0*   CBC: Recent Labs  Lab 12/23/19 0420 12/23/19 0420 12/24/19 0402 12/24/19 0402 12/25/19 0349 12/25/19 0349 12/25/19 1705 12/26/19 0417 12/27/19 0405  WBC 16.3*   < > 12.3*   < > 11.2*  --   --  11.2* 8.3  NEUTROABS 13.1*   < > 9.6*   < > 8.8*  --   --  8.8* 6.1  HGB 7.8*   < > 7.6*   < > 7.1*   < > 9.4* 9.2* 8.5*  HCT 24.1*   < > 24.1*   < > 22.8*   < > 28.4* 28.0* 26.0*  MCV 89.3  --  89.9  --  89.8  --   --  86.7 87.5  PLT 157   < > 180   < > 220  --   --  270 245   < > = values in this interval not displayed.   Cardiac Enzymes: No results for input(s): CKTOTAL, CKMB, CKMBINDEX, TROPONINI in the last 168 hours. CBG: Recent Labs  Lab 12/26/19 1224 12/26/19 1707 12/26/19 2126 12/27/19 0748 12/27/19 1136  GLUCAP 106* 166*  137* 125* 143*    Studies/Results: IR Fluoro Guide CV Line Right  Result Date: 12/27/2019 CLINICAL DATA:  Renal failure and need for tunneled hemodialysis catheter. Current right jugular temporary non tunneled dialysis catheter in place. This is needed currently for IV access and conscious sedation for the tunnel catheter placement. EXAM: TUNNELED CENTRAL VENOUS HEMODIALYSIS CATHETER PLACEMENT WITH ULTRASOUND AND FLUOROSCOPIC GUIDANCE ANESTHESIA/SEDATION: 1.0 mg IV Versed; 50 mcg IV Fentanyl. Total Moderate Sedation Time:   23 minutes. The patient's level of consciousness and physiologic status were continuously monitored during the procedure by Radiology nursing. MEDICATIONS: 2 g IV Ancef. FLUOROSCOPY TIME:  30 seconds.  3.0 mGy. PROCEDURE: The procedure, risks, benefits, and alternatives were explained to the patient. Questions regarding the procedure were encouraged and answered. The patient understands and consents to the procedure. A timeout was performed prior to initiating the procedure. Ultrasound was used to confirm patency of the left internal jugular vein. The left neck and  chest were prepped with chlorhexidine in a sterile fashion, and a sterile drape was applied covering the operative field. Maximum barrier sterile technique with sterile gowns and gloves were used for the procedure. Local anesthesia was provided with 1% lidocaine. After creating a small venotomy incision, a 21 gauge needle was advanced into the left internal jugular vein under direct, real-time ultrasound guidance. Ultrasound image documentation was performed. After securing guidewire access, an 8 Fr dilator was placed. A J-wire was kinked to measure appropriate catheter length. A Palindrome tunneled hemodialysis catheter measuring 23 cm from tip to cuff was chosen for placement. This was tunneled in a retrograde fashion from the chest wall to the venotomy incision. At the venotomy, serial dilatation was performed and a 16 Fr peel-away sheath was placed over a guidewire. The catheter was then placed through the sheath and the sheath removed. Final catheter positioning was confirmed and documented with a fluoroscopic spot image. The catheter was aspirated, flushed with saline, and injected with appropriate volume heparin dwells. The venotomy incision was closed with subcuticular 4-0 Vicryl. Dermabond was applied to the incision. The catheter exit site was secured with 0-Prolene retention sutures. COMPLICATIONS: None.  No pneumothorax. FINDINGS: After catheter placement, the tip lies in the right atrium. The catheter aspirates normally and is ready for immediate use. IMPRESSION: Placement of tunneled hemodialysis catheter via the left internal jugular vein. The catheter tip lies in the right atrium. The catheter is ready for immediate use. Electronically Signed   By: Aletta Edouard M.D.   On: 12/27/2019 10:13   . amiodarone  200 mg Oral Daily  . Chlorhexidine Gluconate Cloth  6 each Topical Daily  . feeding supplement (PRO-STAT SUGAR FREE 64)  30 mL Oral BID  . fentaNYL      . insulin aspart  0-5 Units  Subcutaneous QHS  . insulin aspart  0-9 Units Subcutaneous TID WC  . insulin glargine  14 Units Subcutaneous Daily  . lidocaine      . midazolam      . multivitamin  1 tablet Oral QHS  . pantoprazole  40 mg Oral BID AC  . polyethylene glycol  17 g Oral Daily  . senna-docusate  1 tablet Oral BID  . sodium chloride flush  10-40 mL Intracatheter Q12H  . sucralfate  1 g Oral Q6H    BMET    Component Value Date/Time   NA 130 (L) 12/27/2019 0405   K 3.6 12/27/2019 0405   CL 92 (L) 12/27/2019 0405   CO2 23  12/27/2019 0405   GLUCOSE 130 (H) 12/27/2019 0405   BUN 34 (H) 12/27/2019 0405   CREATININE 5.29 (H) 12/27/2019 0405   CREATININE 1.03 10/20/2013 1751   CALCIUM 8.0 (L) 12/27/2019 0405   GFRNONAA 7 (L) 12/27/2019 0405   GFRAA 9 (L) 12/27/2019 0405   CBC    Component Value Date/Time   WBC 8.3 12/27/2019 0405   RBC 2.97 (L) 12/27/2019 0405   HGB 8.5 (L) 12/27/2019 0405   HCT 26.0 (L) 12/27/2019 0405   PLT 245 12/27/2019 0405   MCV 87.5 12/27/2019 0405   MCH 28.6 12/27/2019 0405   MCHC 32.7 12/27/2019 0405   RDW 15.8 (H) 12/27/2019 0405   LYMPHSABS 0.7 12/27/2019 0405   MONOABS 1.2 (H) 12/27/2019 0405   EOSABS 0.0 12/27/2019 0405   BASOSABS 0.0 12/27/2019 0405    Assessment/Plan:  1. AKI- in setting of sepsis and severe acute pancreatitis +/- IV contrast exposure. CRRT started on 12/15/19. Stopped 12/18/19. Remains oliguric/anuric.Had difficulty with HD2/17/21 with poorly functioningtrialysis catheter but worked better 12/23/19. - HD on 2/24 per MWF schedule for now as needed - Will continue to follow for renal recovery. 2. Acute gallstone pacreatitis- s/p ERCP with biliary stent placement.  3. Anemia of critical illness and GIB- stable. Continue to follow and transfuse prn.  Will give aranesp 40 mcg once on 2/23.  4. Atrial fibrillation with RVR-  Per primary team  5. Leukocytosis- improved. 6. Hyponatremia - improved after HD 7. HTN -increased UF goal.  Acceptable on HD.   Claudia Desanctis, MD 12/27/2019 12:55 PM

## 2019-12-27 NOTE — Progress Notes (Signed)
PT Cancellation Note  Patient Details Name: Katherine Oliver MRN: [id number] DOB: 04-22-1946   Cancelled Treatment:    Reason Eval/Treat Not Completed: Patient at procedure or test/unavailable(in IR)   [name] [name] 12/27/2019, 8:33 AM  [name], PT Acute Rehabilitation Services Pager: [phone] Office: [phone]

## 2019-12-27 NOTE — Progress Notes (Signed)
Nutrition Follow-up  RD working remotely.  DOCUMENTATION CODES:   Not applicable  INTERVENTION:   -D/c Prostat -Nepro Shake po TID, each supplement provides 425 kcal and 19 grams protein -Continue renal MVI daily -Magic cup TID with meals, each supplement provides 290 kcal and 9 grams of protein  NUTRITION DIAGNOSIS:   Inadequate oral intake related to altered GI function, acute illness as evidenced by NPO status.  Progressing; advanced to soft diet  GOAL:   Patient will meet greater than or equal to 90% of their needs  Ptogressing MONITOR:   PO intake, Supplement acceptance, Labs, Weight trends, Skin, I & O's  REASON FOR ASSESSMENT:   Rounds    ASSESSMENT:   74 yo female admitted with severe sepsis with multi-orgran failure secondary to acute gallstone pancreatitis. PMH includes HTN, DM, CKD III, GERD, hepatic steatosis  2/09 Admitted 2/10 ERCP with stent placement 2/11 Transfer to ICU, CRRT initiated 2/12 Cortrak placed (tip at LOT), TF initiated 2/14 CRRT stopped 2/15 EGD: grade II distal esophagitis, multiple gastric ulcers 2/16 TF d/c, diet advanced; transferred out of ICU to floor 2/18- cortrak tube removed 2/19- advanced to full liquid diet 2/20- advanced to soft diet 2/23- s/p HD cath placement  Reviewed I/O's: -1.4L x 24 hours and +1.8 L since admission  UOP: 100 ml x 24 hours  Pt with variable appetite; noted meal completion documented at 15-100%. Pt has also been NPO due to numerous procedures. She consumed 50% of lunch.   Pt poorly accepting of supplements (Boost Breeze and Prostat). RD will try other supplements now that diet advancement allows for more options.   Per nephrology notes, last HD on 12/26/19. Plan for HD on 12/28/19. Continuing to monitor for signs of renal recovery.   Labs reviewed: Na: 130, Phos: 5.9, CBGS: 125-143 (inpatient orders for glycemic control are 0-5 units insulin aspart TID q HS, 0-9 units insulin aspart TID with  meals, and 14 units insulin glargine daily).   Diet Order:   Diet Order            DIET SOFT Room service appropriate? Yes; Fluid consistency: Thin  Diet effective now              EDUCATION NEEDS:   Not appropriate for education at this time  Skin:  Skin Assessment: Skin Integrity Issues: Skin Integrity Issues:: Other (Comment) Other: MASD sacrum and perineum  Last BM:  12/27/19  Height:   Ht Readings from Last 1 Encounters:  12/13/19 5' 0.98" (1.549 m)    Weight:   Wt Readings from Last 1 Encounters:  12/26/19 81.4 kg   BMI:  Body mass index is 33.93 kg/m.  Estimated Nutritional Needs:   Kcal:  1700-1900 kcals  Protein:  90-105 g  Fluid:  >/= 1.8 L    Loistine Chance, RD, LDN, [place] Registered Dietitian II Certified Diabetes Care and Education Specialist Please refer to [facility] for RD and/or RD on-call/weekend/after hours pager

## 2019-12-28 LAB — CBC WITH DIFFERENTIAL/PLATELET
Abs Immature Granulocytes: 0.12 10*3/uL — ABNORMAL HIGH (ref 0.00–0.07)
Basophils Absolute: 0.1 10*3/uL (ref 0.0–0.1)
Basophils Relative: 1 %
Eosinophils Absolute: 0 10*3/uL (ref 0.0–0.5)
Eosinophils Relative: 0 %
HCT: 25 % — ABNORMAL LOW (ref 36.0–46.0)
Hemoglobin: 8.1 g/dL — ABNORMAL LOW (ref 12.0–15.0)
Immature Granulocytes: 2 %
Lymphocytes Relative: 10 %
Lymphs Abs: 0.7 10*3/uL (ref 0.7–4.0)
MCH: 28.1 pg (ref 26.0–34.0)
MCHC: 32.4 g/dL (ref 30.0–36.0)
MCV: 86.8 fL (ref 80.0–100.0)
Monocytes Absolute: 1 10*3/uL (ref 0.1–1.0)
Monocytes Relative: 13 %
Neutro Abs: 5.6 10*3/uL (ref 1.7–7.7)
Neutrophils Relative %: 74 %
Platelets: 248 10*3/uL (ref 150–400)
RBC: 2.88 MIL/uL — ABNORMAL LOW (ref 3.87–5.11)
RDW: 15.5 % (ref 11.5–15.5)
WBC: 7.5 10*3/uL (ref 4.0–10.5)
nRBC: 0 % (ref 0.0–0.2)

## 2019-12-28 LAB — RENAL FUNCTION PANEL
Albumin: 2 g/dL — ABNORMAL LOW (ref 3.5–5.0)
Anion gap: 14 (ref 5–15)
BUN: 40 mg/dL — ABNORMAL HIGH (ref 8–23)
CO2: 22 mmol/L (ref 22–32)
Calcium: 8.1 mg/dL — ABNORMAL LOW (ref 8.9–10.3)
Chloride: 92 mmol/L — ABNORMAL LOW (ref 98–111)
Creatinine, Ser: 6.02 mg/dL — ABNORMAL HIGH (ref 0.44–1.00)
GFR calc Af Amer: 7 mL/min — ABNORMAL LOW (ref >60)
GFR calc non Af Amer: 6 mL/min — ABNORMAL LOW (ref >60)
Glucose, Bld: 137 mg/dL — ABNORMAL HIGH (ref 70–99)
Phosphorus: 6 mg/dL — ABNORMAL HIGH (ref 2.5–4.6)
Potassium: 3.4 mmol/L — ABNORMAL LOW (ref 3.5–5.1)
Sodium: 128 mmol/L — ABNORMAL LOW (ref 135–145)

## 2019-12-28 LAB — BPAM RBC
Blood Product Expiration Date: 202102282359
Blood Product Expiration Date: 202103182359
Blood Product Expiration Date: 202103222359
ISSUE DATE / TIME: 202102171840
ISSUE DATE / TIME: 202102211203
Unit Type and Rh: 7300
Unit Type and Rh: 7300
Unit Type and Rh: 7300

## 2019-12-28 LAB — TYPE AND SCREEN
ABO/RH(D): B POS
Antibody Screen: NEGATIVE
Unit division: 0
Unit division: 0
Unit division: 0

## 2019-12-28 LAB — GLUCOSE, CAPILLARY
Glucose-Capillary: 141 mg/dL — ABNORMAL HIGH (ref 70–99)
Glucose-Capillary: 121 mg/dL — ABNORMAL HIGH (ref 70–99)
Glucose-Capillary: 162 mg/dL — ABNORMAL HIGH (ref 70–99)
Glucose-Capillary: 89 mg/dL (ref 70–99)
Glucose-Capillary: 199 mg/dL — ABNORMAL HIGH (ref 70–99)

## 2019-12-28 MED ORDER — HEPARIN SODIUM (PORCINE) 1000 UNIT/ML IJ SOLN
INTRAMUSCULAR | Status: AC
  Administered 2019-12-28: 4000 [IU]
  Filled 2019-12-28: qty 4

## 2019-12-28 NOTE — Progress Notes (Signed)
Pt returned to 6N10 via bed after dialysis. Will continue to monitor.

## 2019-12-28 NOTE — Consult Note (Signed)
   [facility] CM Inpatient Consult   12/28/2019  Katherine [facility] July 15, 1946 [id number]    Patientwas evaluated for long length of stay (LLOS), with 7-day and 30-day readmissions, 21%risk score forunplanned readmission and hospitalizationsin the past 6 months; and to check for potential needs of [name] care management servicesas a benefit under her Medicare/ NextGenplan.  Chart review and MDnotesrevealas follows: 74 y/o female admitted on 2/9 with gallstone pancreatitis and UTI. Had ERCP on 2/10 with stent placement (technically difficult and complex), followed by worsening pancreatic pain, increased tachycardia, increased work of breathing and supplemental oxygen use, worsening AKI, PCCM consulted.  General surgery also consulted due to increased intrabladder pressure and concern for possible abdominal compartment syndrome. On 2/12 patient went into A. fib with RVR (new), started on diltiazem, later became hypotensive and switched to amiodarone. Patient also had an EGD done on 2/15 due to possible GI bleed. EGD showed stress ulceration. Due to some improvement in overall clinical status, [facility] hospitalist assumed care on 12/21/2019.  Called and spoke with patientover the phone.HIPAA verified.  Patient reports plan of discharge tohome and arranged with [name].   Discussed withpatient regarding [facility] [facility]) care managementserviceswhich areavailable to help manage health needs/concerns,maintain health andto prevent readmissions.Patient indicates having no concernswith medications (self-managed); pharmacy (using [facility] mail delivery service and [facility] [place]); transportation (provided by husbandLegrand Oliver). Patient states living with husband (aging) who assists/ supports with needs.  Patient had also mentioned that husband is looking into their long-term care insurance to get coverage for personal care services that can  assist with cleaning and cooking at home.  Patient endorses her primary care provider as Dr. [name] with [facility], office providing transition of care follow-up. She was made aware ofneed to follow up with primary care provider after discharge.  Verbal consent given by patientfor follow-up calls forTHN CM servicespost hospital discharge.   Referralmadeto [facility]N RNcaremanagementcoordinator for complexcare managementneeds related to chronic health conditions and new onset of A-fib with RVR (rapid ventricular response); and to [facility] social worker for [employer] and assistance with Advance Directives; and to assess further needs after discharge.  Transition of care CM note shows that patient's disposition is home with home health PT via [name]. Made aware that [facility] CM is following patient upon discharge.   For questionsand additional information,please contact:  Marybell Robards A. Marvell Stavola, BSN, RN-BC [facility] Liaison Cell: [phone]

## 2019-12-28 NOTE — Progress Notes (Signed)
Pt transported to hemodialysis  

## 2019-12-28 NOTE — TOC Initial Note (Addendum)
Transition of Care [facility]) - Initial/Assessment Note    Patient Details  Name: Katherine Oliver MRN: [id number] Date of Birth: [birth year]  Transition of Care [facility]) CM/SW Contact:    Marilu Favre, RN Phone Number: 12/28/2019, 10:32 AM  Clinical Narrative:                 Spoke to patient and husband at bedside on 12/23/19 and provided home health list. Followed up with patient today , she would like [name] . Called Cassie with [name] awaiting call back. Cassie with [name] accepted HHPT referral.   She has a walker at home will need 3 in 1 . Ordered 3 in1 with Zack with [facility]   Expected Discharge Plan: [place] Barriers to Discharge: Continued Medical Work up   Patient Goals and CMS Choice Patient states their goals for this hospitalization and ongoing recovery are:: to return to home CMS Medicare.gov Compare Post Acute Care list provided to:: Patient Choice offered to / list presented to : Patient  Expected Discharge Plan and Services Expected Discharge Plan: [place]   Discharge Planning Services: CM Consult Post Acute Care Choice: [place] arrangements for the past 2 months: Single Family Home                 DME Arranged: 3-N-1 DME Agency: [facility] Date DME Agency Contacted: 12/28/19 Time DME Agency Contacted: 25 Representative spoke with at DME Agency: [place]: PT [place] Date [place]: 12/28/19 Time [place]: 29 Representative spoke with at [place] Arrangements/Services Living arrangements for the past 2 months: [place] with:: Spouse Patient language and need for interpreter reviewed:: Yes Do you feel safe going back to the place where you live?: Yes      Need for Family Participation in Patient Care: Yes (Comment) Care giver support system in place?: Yes (comment) Current  home services: DME Criminal Activity/Legal Involvement Pertinent to Current Situation/Hospitalization: No - Comment as needed  Activities of Daily Living Home Assistive Devices/Equipment: None ADL Screening (condition at time of admission) Patient's cognitive ability adequate to safely complete daily activities?: Yes Is the patient deaf or have difficulty hearing?: No Does the patient have difficulty seeing, even when wearing glasses/contacts?: No Does the patient have difficulty concentrating, remembering, or making decisions?: No Patient able to express need for assistance with ADLs?: Yes Does the patient have difficulty dressing or bathing?: No Independently performs ADLs?: Yes (appropriate for developmental age) Does the patient have difficulty walking or climbing stairs?: No Weakness of Legs: None Weakness of Arms/Hands: None  Permission Sought/Granted   Permission granted to share information with : No              Emotional Assessment Appearance:: Appears stated age Attitude/Demeanor/Rapport: Engaged Affect (typically observed): Accepting Orientation: : Oriented to Self, Oriented to Place, Oriented to  Time, Oriented to Situation Alcohol / Substance Use: Not Applicable Psych Involvement: No (comment)  Admission diagnosis:  Acute gallstone pancreatitis [K85.10] Patient Active Problem List   Diagnosis Date Noted  . Acute renal failure (ARF) ([place]) 12/15/2019  . Acute respiratory failure ([place]) 12/15/2019  . Choledocholithiasis   . Acute gallstone pancreatitis 12/13/2019  . Sepsis ([place]) 12/13/2019  . Primary localized osteoarthritis of right knee 01/05/2019  . 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 Hx of adenomatous polyp of colon 12/15/2017  . Vulvar  dermatitis 12/07/2017  . Stress due to illness of family member 08/18/2017  . Right hip pain 08/07/2016  . CKD stage 3 due to type 2 diabetes mellitus ([place]) 04/04/2016  . Trigger ring finger of right hand  04/04/2016  . Pulmonary nodules 01/03/2016  . Advanced care planning/counseling discussion 07/05/2015  . Obesity, Class I, BMI 30-34.9 07/05/2015  . 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 Osteopenia 06/03/2013  . Medicare annual wellness visit, subsequent 05/31/2012  . Rotator cuff tendonitis, right 03/01/2012  . Polycythemia 12/02/2011  . 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 HTN (hypertension)   . Dyslipidemia associated with type 2 diabetes mellitus ([place])   . History of recurrent UTIs   . Allergic rhinitis   . GERD (gastroesophageal reflux disease)   . Rosacea   . NAFLD (nonalcoholic fatty liver disease) 06/03/2010   PCP:  [name], MD Pharmacy:   [place], [address] [place] [address] [place] [address] Phone: [phone] Fax: [phone]  [id number] [place] [place] [address] [place] [address] Phone: [phone] Fax: [phone]     Social Determinants of Health (SDOH) Interventions    Readmission Risk Interventions No flowsheet data found.

## 2019-12-28 NOTE — Progress Notes (Signed)
Nephrology Progress Note:   Patient ID: Katherine Oliver, female   DOB: 1946-01-28, 74 y.o.   MRN: [id number]    S:  Seen on HD 145/71 and HR 93.  Left tunneled catheter.  Flow improved after repositioning her head.  Had 450 mL uop over 2/23 and 300 is charted for today.  Review of systems   Shortness of breath better Continues with nausea   Not much urine   O:BP (!) 149/75 (BP Location: Left Arm)   Pulse 89   Temp 97.7 F (36.5 C) (Oral)   Resp (!) 22   Ht 5' 0.98" (1.549 m)   Wt 85.7 kg   SpO2 91%   BMI 35.72 kg/m   Intake/Output Summary (Last 24 hours) at 12/28/2019 1607 Last data filed at 12/28/2019 1045 Gross per 24 hour  Intake --  Output 750 ml  Net -750 ml   Intake/Output: I/O last 3 completed shifts: In: 563 [P.O.:300; I.V.:667] Out: 450 [Urine:450]  Intake/Output this shift:  Total I/O In: -  Out: 300 [Urine:300] Weight change:    Physical exam:   Gen: adult female NAD CVS: S1S2 no rub Resp: cta but reduced unlabored; on room air  Abd: soft, nontender; obese habitus Ext: trace if any edema Neuro alert and oriented x3 ; provides hx and follows commands  Access right IJ nontunneled catheter; left chest tunneled catheter    Recent Labs  Lab 12/22/19 0216 12/23/19 0420 12/24/19 0402 12/25/19 0349 12/26/19 0417 12/27/19 0405 12/28/19 0420  NA 132* 127* 129* 128* 123* 130* 128*  K 3.7 3.6 3.6 3.8 3.9 3.6 3.4*  CL 97* 93* 91* 89* 86* 92* 92*  CO2 21* 21* 25 24 20* 23 22  GLUCOSE 151* 187* 169* 150* 120* 130* 137*  BUN 43* 60* 27* 46* 63* 34* 40*  CREATININE 5.55* 7.24* 4.61* 6.17* 7.43* 5.29* 6.02*  ALBUMIN 2.1* 2.0* 2.0* 2.0* 2.1* 2.0* 2.0*  CALCIUM 7.8* 7.8* 7.9* 7.9* 7.9* 8.0* 8.1*  PHOS 5.6* 7.3* 6.0* 7.2* 8.4* 5.9* 6.0*   Liver Function Tests: Recent Labs  Lab 12/26/19 0417 12/27/19 0405 12/28/19 0420  ALBUMIN 2.1* 2.0* 2.0*   CBC: Recent Labs  Lab 12/24/19 0402 12/24/19 0402 12/25/19 0349 12/25/19 1705 12/26/19 0417  12/27/19 0405 12/28/19 0420  WBC 12.3*   < > 11.2*  --  11.2* 8.3 7.5  NEUTROABS 9.6*   < > 8.8*  --  8.8* 6.1 5.6  HGB 7.6*   < > 7.1*   < > 9.2* 8.5* 8.1*  HCT 24.1*   < > 22.8*   < > 28.0* 26.0* 25.0*  MCV 89.9  --  89.8  --  86.7 87.5 86.8  PLT 180   < > 220  --  270 245 248   < > = values in this interval not displayed.   Cardiac Enzymes: No results for input(s): CKTOTAL, CKMB, CKMBINDEX, TROPONINI in the last 168 hours. CBG: Recent Labs  Lab 12/27/19 1647 12/27/19 2212 12/28/19 0721 12/28/19 1033 12/28/19 1156  GLUCAP 157* 144* 141* 121* 162*    Studies/Results: IR Fluoro Guide CV Line Right  Result Date: 12/27/2019 CLINICAL DATA:  Renal failure and need for tunneled hemodialysis catheter. Current right jugular temporary non tunneled dialysis catheter in place. This is needed currently for IV access and conscious sedation for the tunnel catheter placement. EXAM: TUNNELED CENTRAL VENOUS HEMODIALYSIS CATHETER PLACEMENT WITH ULTRASOUND AND FLUOROSCOPIC GUIDANCE ANESTHESIA/SEDATION: 1.0 mg IV Versed; 50 mcg IV Fentanyl. Total Moderate Sedation  Time:   23 minutes. The patient's level of consciousness and physiologic status were continuously monitored during the procedure by Radiology nursing. MEDICATIONS: 2 g IV Ancef. FLUOROSCOPY TIME:  30 seconds.  3.0 mGy. PROCEDURE: The procedure, risks, benefits, and alternatives were explained to the patient. Questions regarding the procedure were encouraged and answered. The patient understands and consents to the procedure. A timeout was performed prior to initiating the procedure. Ultrasound was used to confirm patency of the left internal jugular vein. The left neck and chest were prepped with chlorhexidine in a sterile fashion, and a sterile drape was applied covering the operative field. Maximum barrier sterile technique with sterile gowns and gloves were used for the procedure. Local anesthesia was provided with 1% lidocaine. After creating a  small venotomy incision, a 21 gauge needle was advanced into the left internal jugular vein under direct, real-time ultrasound guidance. Ultrasound image documentation was performed. After securing guidewire access, an 8 Fr dilator was placed. A J-wire was kinked to measure appropriate catheter length. A Palindrome tunneled hemodialysis catheter measuring 23 cm from tip to cuff was chosen for placement. This was tunneled in a retrograde fashion from the chest wall to the venotomy incision. At the venotomy, serial dilatation was performed and a 16 Fr peel-away sheath was placed over a guidewire. The catheter was then placed through the sheath and the sheath removed. Final catheter positioning was confirmed and documented with a fluoroscopic spot image. The catheter was aspirated, flushed with saline, and injected with appropriate volume heparin dwells. The venotomy incision was closed with subcuticular 4-0 Vicryl. Dermabond was applied to the incision. The catheter exit site was secured with 0-Prolene retention sutures. COMPLICATIONS: None.  No pneumothorax. FINDINGS: After catheter placement, the tip lies in the right atrium. The catheter aspirates normally and is ready for immediate use. IMPRESSION: Placement of tunneled hemodialysis catheter via the left internal jugular vein. The catheter tip lies in the right atrium. The catheter is ready for immediate use. Electronically Signed   By: Aletta Edouard M.D.   On: 12/27/2019 10:13   . amiodarone  200 mg Oral Daily  . Chlorhexidine Gluconate Cloth  6 each Topical Daily  . Chlorhexidine Gluconate Cloth  6 each Topical Q0600  . feeding supplement (NEPRO CARB STEADY)  237 mL Oral TID BM  . fluticasone  1 spray Each Nare Daily  . heparin      . insulin aspart  0-5 Units Subcutaneous QHS  . insulin aspart  0-9 Units Subcutaneous TID WC  . insulin glargine  14 Units Subcutaneous Daily  . multivitamin  1 tablet Oral QHS  . pantoprazole  40 mg Oral BID AC  .  polyethylene glycol  17 g Oral Daily  . senna-docusate  1 tablet Oral BID  . sodium chloride flush  10-40 mL Intracatheter Q12H  . sucralfate  1 g Oral Q6H    BMET    Component Value Date/Time   NA 128 (L) 12/28/2019 0420   K 3.4 (L) 12/28/2019 0420   CL 92 (L) 12/28/2019 0420   CO2 22 12/28/2019 0420   GLUCOSE 137 (H) 12/28/2019 0420   BUN 40 (H) 12/28/2019 0420   CREATININE 6.02 (H) 12/28/2019 0420   CREATININE 1.03 10/20/2013 1751   CALCIUM 8.1 (L) 12/28/2019 0420   GFRNONAA 6 (L) 12/28/2019 0420   GFRAA 7 (L) 12/28/2019 0420   CBC    Component Value Date/Time   WBC 7.5 12/28/2019 0420   RBC 2.88 (L) 12/28/2019  0420   HGB 8.1 (L) 12/28/2019 0420   HCT 25.0 (L) 12/28/2019 0420   PLT 248 12/28/2019 0420   MCV 86.8 12/28/2019 0420   MCH 28.1 12/28/2019 0420   MCHC 32.4 12/28/2019 0420   RDW 15.5 12/28/2019 0420   LYMPHSABS 0.7 12/28/2019 0420   MONOABS 1.0 12/28/2019 0420   EOSABS 0.0 12/28/2019 0420   BASOSABS 0.1 12/28/2019 0420    Assessment/Plan:  1. AKI- in setting of sepsis and severe acute pancreatitis +/- IV contrast exposure. CRRT started on 12/15/19. Stopped 12/18/19. Remains oliguric/anuric.Had difficulty with HD2/17/21 with poorly functioningtrialysis catheter but worked better 12/23/19. - HD on 2/24 per MWF schedule for now.  Placed order to remove nontunneled catheter - Will continue to follow for renal recovery though have contacted SW for HD regarding outpatient unit should one be needed. 2. Acute gallstone pacreatitis- s/p ERCP with biliary stent placement.  3. Anemia of critical illness and GIB- stable. Continue to follow and transfuse prn.  aranesp 40 mcg once on 2/23.  4. Atrial fibrillation with RVR-  Per primary team  5. Leukocytosis- improved. 6. Hyponatremia - improved after HD 7. HTN -increased UF goal. Acceptable on HD.   Claudia Desanctis, MD 12/28/2019 4:31 PM

## 2019-12-28 NOTE — Progress Notes (Signed)
Responded to consult to remove R IJ temporary dialysis catheter. Pt not in room upon arrival. Pt's husband reported pt went for dialysis treatment shortly after 1500. VAST will return at later time to remove line as ordered.

## 2019-12-28 NOTE — Plan of Care (Signed)
  Problem: Education: Goal: Knowledge of General Education information will improve Description: Including pain rating scale, medication(s)/side effects and non-pharmacologic comfort measures Outcome: Progressing   Problem: Health Behavior/Discharge Planning: Goal: Ability to manage health-related needs will improve Outcome: Progressing   Problem: Clinical Measurements: Goal: Ability to maintain clinical measurements within normal limits will improve Outcome: Progressing   Problem: Activity: Goal: Risk for activity intolerance will decrease Outcome: Progressing   Problem: Nutrition: Goal: Adequate nutrition will be maintained Outcome: Progressing   Problem: Elimination: Goal: Will not experience complications related to urinary retention Outcome: Progressing   Problem: Pain Managment: Goal: General experience of comfort will improve Outcome: Progressing   Problem: Safety: Goal: Ability to remain free from injury will improve Outcome: Progressing   Problem: Skin Integrity: Goal: Risk for impaired skin integrity will decrease Outcome: Progressing   

## 2019-12-28 NOTE — Progress Notes (Signed)
OT Cancellation Note  Patient Details Name: Katherine Oliver MRN: [id number] DOB: Jul 22, 1946   Cancelled Treatment:    Reason Eval/Treat Not Completed: Patient declined, no reason specified; pt declined participation in OT today, reports waiting for HD and declines engagement while waiting for transport. Will follow and see as able.   Jolaine Artist, OT [facility] Services Pager [phone] Office [phone]    Delight Stare 12/28/2019, 2:35 PM

## 2019-12-28 NOTE — Progress Notes (Signed)
[facility] received IV team consult to remove R IJ temporary [place] earlier today. Order needs to be placed by nephrology as a discontinue central line order. Pt's night shift nurse notified.

## 2019-12-28 NOTE — Progress Notes (Signed)
PROGRESS NOTE    Katherine Oliver  YTK:[id number] DOB: March 01, 1946 DOA: 12/13/2019 PCP: [name], MD   Brief Narrative:  HPI from PCCM Patient is a 74 year old obese Caucasian female with a past medical significant for but not limited to gallstone pancreatitis and UTI.  She had an ERCP on 210 with stent placement and PCCM was consulted to admit worsening pancreatic pain, increased tachycardia and increased work of breathing supplemental oxygen use.  She had a worsening AKI.  General surgery was consulted due to increased intrabladder pressure and concern for possible abdominal compartment syndrome.  On 12/16/2019 when she went into A. fib with RVR and started on diltiazem and later became hypotensive and switched to amiodarone.  Patient also had an EGD done on 12/19/2019 due to possible GI bleed and EGD showed stress ulcerations.  Due to some improvement in her overall clinical status tried hospitalist assumed care on 12/21/2019.  Because her renal function has not improved significantly for CKD she is started on hemodialysis and had a tunneled catheter placed yesterday.  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, without long-term current use of insulin (HCC)   HTN (hypertension)   Dyslipidemia associated with type 2 diabetes mellitus (HCC)   NAFLD (nonalcoholic fatty liver disease)   GERD (gastroesophageal reflux disease)   Polycythemia   Obesity, Class I, BMI 30-34.9   CKD stage 3 due to type 2 diabetes mellitus (HCC)   Acute gallstone pancreatitis   Sepsis ([place])   Acute renal failure (ARF) ([place])   Acute respiratory failure (HCC)   Choledocholithiasis   Gallstone pancreatitis post ERCP with biliary stent placement Acute Hepatitis -LFTs improved and not checked the last few days -GI on board, continue supportive care and advance diet as tolerated -Continue PPI, Carafate -On discharge she will need to follow-up  with GI to address her stent and repeat ERCP  Acute blood loss anemia/GI bleeding 2/2 gastric/stress ulcers -Status post 3 units of PRBC, last transfusion of 1U of PRBC on 12/25/19 -LFTs improved and not checked the last few days -GI on board, continue supportive care and advance diet as tolerated -Continue PPI, Carafate -Continue to Monitor Blood Count and last few days her Hb/Hct has been dropping and went from 9.2/28.0 -> 8.5/26.0 -> 8.1/25.0 -Continue to monitor for signs and symptoms of bleeding -Repeat CBC in a.m.  AKI with oliguria/anuric/hyponatremia -In the setting of sepsis and severe acute pancreatitis -Status post CRRT, started on 2/11, stopped on 2/14 -Currently on HD (M/W/F) -Nephrology on board -S/P TDC on 12/27/19 by IR -Currently getting Dialyzed today -BUN/Cr worsened from 34/5.29 -> 40/6.02 -Na+ was 128 -Further Care Per Nephrology -Outpatient HD referral made by the nephrology social worker but nephrology still wants to continue to monitor to see if her renal function improves the next couple days and nephrology will continue to follow for renal recovery  Leukocytosis, improved -Remains afebrile, hemodynamically stable -No obvious signs of infection -Continue to monitor off antibiotics -C/w CBC  New onset A. fib with RVR -Possibly likely related to sepsis -Currently rate controlled -Currently on oral Amiodarone 200 mg po Daily, may discontinue on discharge if continues to remain sinus rhythm  Thrombocytopenia -Resolved -Monitor closely for any signs of bleeding -Platelet Count went from 270 -> 245 -> 248 -Continue to Monitor and Repeat CBC in AM   Acute hypoxemic and hypercarbic respiratory failure -Chest x-ray showed atelectasis -Currently on supplemental oxygen, plan to wean off -  Continue incentive spirometry -Repeat CXR in AM   Enterococcus UTI -S/p Completed antibiotics  Diabetes mellitus type 2 -C/w SSI, Lantus, Accu-Cheks, hypoglycemic  protocol -CBG's ranging from 121-162  Obesity -Estimated body mass index is 35.72 kg/m as calculated from the following:   Height as of this encounter: 5' 0.98" (1.549 m).   Weight as of this encounter: 85.7 kg. -Weight Loss and Dietary Counseling given   Physical deconditioning -PT/OT recommending Home Health -Out of bed as tolerated  DVT prophylaxis: SCDs Code Status: FULL CODE  Family Communication: No family present at bedside  Disposition Plan: Spent in discharging home with home health next few days now that she is being dialyzed.  She will need to have a seat for outpatient dialysis and renal was still observed for a few more days to ensure that her renal function does not recover prior to her getting set up with outpatient dialysis.  Consultants:   Gastroenterology  Nephrology   Procedures:    Antimicrobials:  Anti-infectives (From admission, onward)   Start     Dose/Rate Route Frequency Ordered Stop   12/27/19 0930  ceFAZolin (ANCEF) IVPB 2g/100 mL premix     2 g 200 mL/hr over 30 Minutes Intravenous  Once 12/27/19 0809 12/27/19 0910   12/27/19 0842  ceFAZolin (ANCEF) 2-4 GM/100ML-% IVPB    Note to Pharmacy: [place]   : cabinet override      12/27/19 0842 12/27/19 2044   12/15/19 2100  piperacillin-tazobactam (ZOSYN) IVPB 3.375 g  Status:  Discontinued     3.375 g 12.5 mL/hr over 240 Minutes Intravenous Every 6 hours 12/15/19 1717 12/15/19 1718   12/15/19 2100  piperacillin-tazobactam (ZOSYN) IVPB 3.375 g  Status:  Discontinued     3.375 g 100 mL/hr over 30 Minutes Intravenous Every 6 hours 12/15/19 1718 12/17/19 0849   12/15/19 1500  piperacillin-tazobactam (ZOSYN) IVPB 2.25 g  Status:  Discontinued     2.25 g 100 mL/hr over 30 Minutes Intravenous Every 6 hours 12/15/19 1452 12/15/19 1717   12/13/19 2200  piperacillin-tazobactam (ZOSYN) IVPB 3.375 g  Status:  Discontinued     3.375 g 12.5 mL/hr over 240 Minutes Intravenous Every 8 hours 12/13/19  2124 12/15/19 1452     Subjective: Seen and examined at bedside states that she was having some left lower quadrant pain which is improved with Carafate.  No nausea or vomiting.  States her mid abdominal pain that she presented with significantly improved.  She is for dialysis today.  No other concerns or complaints at this time.  Objective: Vitals:   12/27/19 1306 12/27/19 1445 12/27/19 2046 12/28/19 0505  BP:  (!) 157/68 (!) 154/61 (!) 145/65  Pulse:  92 91 95  Resp:  14 18 18   Temp:  97.7 F (36.5 C) 97.9 F (36.6 C) 97.9 F (36.6 C)  TempSrc:  Axillary Oral Oral  SpO2: 93% 93% (!) 88% 94%  Weight:      Height:        Intake/Output Summary (Last 24 hours) at 12/28/2019 1541 Last data filed at 12/28/2019 1045 Gross per 24 hour  Intake 667 ml  Output 750 ml  Net -83 ml   Filed Weights   12/23/19 0700 12/23/19 1048 12/26/19 1123  Weight: 80.8 kg 79.6 kg 81.4 kg   Examination: Physical Exam:  Constitutional: WN/WD obese Caucasian female in NAD and appears calm but mildly uncomfortable Eyes: Lids and conjunctivae normal, sclerae anicteric  ENMT: External Ears, Nose appear normal. Grossly  normal hearing.  Neck: Appears normal, supple, no cervical masses, normal ROM, no appreciable thyromegaly Respiratory: Diminished to auscultation bilaterally, no wheezing, rales, rhonchi or crackles. Normal respiratory effort and patient is not tachypenic. No accessory muscle use.  Cardiovascular: RRR, no murmurs / rubs / gallops. S1 and S2 auscultated. Trace extremity edema Abdomen: Soft, mildly-tender, Distended 2/2 body habitus. No masses palpated.  GU: Deferred. Musculoskeletal: No clubbing / cyanosis of digits/nails. No joint deformity upper and lower extremities.   Skin: No rashes, lesions, ulcers. No induration; Warm and dry.  Neurologic: CN 2-12 grossly intact with no focal deficits. Romberg sign and cerebellar reflexes not assessed.  Psychiatric: Normal judgment and insight. Alert  and oriented x 3. Normal mood and appropriate affect.   Data Reviewed: I have personally reviewed following labs and imaging studies  CBC: Recent Labs  Lab 12/24/19 0402 12/24/19 0402 12/25/19 0349 12/25/19 1705 12/26/19 0417 12/27/19 0405 12/28/19 0420  WBC 12.3*  --  11.2*  --  11.2* 8.3 7.5  NEUTROABS 9.6*  --  8.8*  --  8.8* 6.1 5.6  HGB 7.6*   < > 7.1* 9.4* 9.2* 8.5* 8.1*  HCT 24.1*   < > 22.8* 28.4* 28.0* 26.0* 25.0*  MCV 89.9  --  89.8  --  86.7 87.5 86.8  PLT 180  --  220  --  270 245 248   < > = values in this interval not displayed.   Basic Metabolic Panel: Recent Labs  Lab 12/24/19 0402 12/25/19 0349 12/26/19 0417 12/27/19 0405 12/28/19 0420  NA 129* 128* 123* 130* 128*  K 3.6 3.8 3.9 3.6 3.4*  CL 91* 89* 86* 92* 92*  CO2 25 24 20* 23 22  GLUCOSE 169* 150* 120* 130* 137*  BUN 27* 46* 63* 34* 40*  CREATININE 4.61* 6.17* 7.43* 5.29* 6.02*  CALCIUM 7.9* 7.9* 7.9* 8.0* 8.1*  PHOS 6.0* 7.2* 8.4* 5.9* 6.0*   GFR: Estimated Creatinine Clearance: 7.8 mL/min (A) (by C-G formula based on SCr of 6.02 mg/dL (H)). Liver Function Tests: Recent Labs  Lab 12/24/19 0402 12/25/19 0349 12/26/19 0417 12/27/19 0405 12/28/19 0420  ALBUMIN 2.0* 2.0* 2.1* 2.0* 2.0*   No results for input(s): LIPASE, AMYLASE in the last 168 hours. No results for input(s): AMMONIA in the last 168 hours. Coagulation Profile: No results for input(s): INR, PROTIME in the last 168 hours. Cardiac Enzymes: No results for input(s): CKTOTAL, CKMB, CKMBINDEX, TROPONINI in the last 168 hours. BNP (last 3 results) Recent Labs    12/29/18 1531  PROBNP 26.0   HbA1C: No results for input(s): HGBA1C in the last 72 hours. CBG: Recent Labs  Lab 12/27/19 1647 12/27/19 2212 12/28/19 0721 12/28/19 1033 12/28/19 1156  GLUCAP 157* 144* 141* 121* 162*   Lipid Profile: No results for input(s): CHOL, HDL, LDLCALC, TRIG, CHOLHDL, LDLDIRECT in the last 72 hours. Thyroid Function Tests: No results  for input(s): TSH, T4TOTAL, FREET4, T3FREE, THYROIDAB in the last 72 hours. Anemia Panel: No results for input(s): VITAMINB12, FOLATE, FERRITIN, TIBC, IRON, RETICCTPCT in the last 72 hours. Sepsis Labs: No results for input(s): PROCALCITON, LATICACIDVEN in the last 168 hours.  No results found for this or any previous visit (from the past 240 hour(s)).   RN Pressure Injury Documentation:     Estimated body mass index is 33.93 kg/m as calculated from the following:   Height as of this encounter: 5' 0.98" (1.549 m).   Weight as of this encounter: 81.4 kg.  Malnutrition Type:  Nutrition Problem: Inadequate oral intake Etiology: altered GI function, acute illness   Malnutrition Characteristics:  Signs/Symptoms: NPO status   Nutrition Interventions:  Interventions: Nepro shake, MVI, Magic cup   Radiology Studies: IR Fluoro Guide CV Line Right  Result Date: 12/27/2019 CLINICAL DATA:  Renal failure and need for tunneled hemodialysis catheter. Current right jugular temporary non tunneled dialysis catheter in place. This is needed currently for IV access and conscious sedation for the tunnel catheter placement. EXAM: TUNNELED CENTRAL VENOUS HEMODIALYSIS CATHETER PLACEMENT WITH ULTRASOUND AND FLUOROSCOPIC GUIDANCE ANESTHESIA/SEDATION: 1.0 mg IV Versed; 50 mcg IV Fentanyl. Total Moderate Sedation Time:   23 minutes. The patient's level of consciousness and physiologic status were continuously monitored during the procedure by Radiology nursing. MEDICATIONS: 2 g IV Ancef. FLUOROSCOPY TIME:  30 seconds.  3.0 mGy. PROCEDURE: The procedure, risks, benefits, and alternatives were explained to the patient. Questions regarding the procedure were encouraged and answered. The patient understands and consents to the procedure. A timeout was performed prior to initiating the procedure. Ultrasound was used to confirm patency of the left internal jugular vein. The left neck and chest were prepped with  chlorhexidine in a sterile fashion, and a sterile drape was applied covering the operative field. Maximum barrier sterile technique with sterile gowns and gloves were used for the procedure. Local anesthesia was provided with 1% lidocaine. After creating a small venotomy incision, a 21 gauge needle was advanced into the left internal jugular vein under direct, real-time ultrasound guidance. Ultrasound image documentation was performed. After securing guidewire access, an 8 Fr dilator was placed. A J-wire was kinked to measure appropriate catheter length. A Palindrome tunneled hemodialysis catheter measuring 23 cm from tip to cuff was chosen for placement. This was tunneled in a retrograde fashion from the chest wall to the venotomy incision. At the venotomy, serial dilatation was performed and a 16 Fr peel-away sheath was placed over a guidewire. The catheter was then placed through the sheath and the sheath removed. Final catheter positioning was confirmed and documented with a fluoroscopic spot image. The catheter was aspirated, flushed with saline, and injected with appropriate volume heparin dwells. The venotomy incision was closed with subcuticular 4-0 Vicryl. Dermabond was applied to the incision. The catheter exit site was secured with 0-Prolene retention sutures. COMPLICATIONS: None.  No pneumothorax. FINDINGS: After catheter placement, the tip lies in the right atrium. The catheter aspirates normally and is ready for immediate use. IMPRESSION: Placement of tunneled hemodialysis catheter via the left internal jugular vein. The catheter tip lies in the right atrium. The catheter is ready for immediate use. Electronically Signed   By: Aletta Edouard M.D.   On: 12/27/2019 10:13   Scheduled Meds: . amiodarone  200 mg Oral Daily  . Chlorhexidine Gluconate Cloth  6 each Topical Daily  . Chlorhexidine Gluconate Cloth  6 each Topical Q0600  . feeding supplement (NEPRO CARB STEADY)  237 mL Oral TID BM  .  fluticasone  1 spray Each Nare Daily  . insulin aspart  0-5 Units Subcutaneous QHS  . insulin aspart  0-9 Units Subcutaneous TID WC  . insulin glargine  14 Units Subcutaneous Daily  . multivitamin  1 tablet Oral QHS  . pantoprazole  40 mg Oral BID AC  . polyethylene glycol  17 g Oral Daily  . senna-docusate  1 tablet Oral BID  . sodium chloride flush  10-40 mL Intracatheter Q12H  . sucralfate  1 g Oral Q6H   Continuous Infusions: . sodium chloride  10 mL/hr at 12/27/19 1600    LOS: 15 days   Kerney Elbe, DO [facility] PAGER is on [place]  If 7PM-7AM, please contact night-coverage [email or web address]

## 2019-12-28 NOTE — Progress Notes (Signed)
OP HD referral discussed with Nephrologist/Dr. Royce Macadamia and Renal Navigator met with patient at bedside. She requests treatment at [facility] with a second shift preference if possible. This clinic is closest to her home in [place]. She states her husband will provide transportation, sharing that he has cancer and does not get up and moving quickly in the mornings, hence the second shift request. Renal Navigator relayed this to [facility] in hopes that we can accommodate.  OP HD referral for AKI completed and submitted. Renal Navigator will follow closely.  Alphonzo Cruise, Section Renal Navigator [phone]

## 2019-12-29 LAB — CBC WITH DIFFERENTIAL/PLATELET
Abs Immature Granulocytes: 0.13 10*3/uL — ABNORMAL HIGH (ref 0.00–0.07)
Basophils Absolute: 0 10*3/uL (ref 0.0–0.1)
Basophils Relative: 1 %
Eosinophils Absolute: 0 10*3/uL (ref 0.0–0.5)
Eosinophils Relative: 0 %
HCT: 25.3 % — ABNORMAL LOW (ref 36.0–46.0)
Hemoglobin: 8.3 g/dL — ABNORMAL LOW (ref 12.0–15.0)
Immature Granulocytes: 2 %
Lymphocytes Relative: 9 %
Lymphs Abs: 0.7 10*3/uL (ref 0.7–4.0)
MCH: 28.1 pg (ref 26.0–34.0)
MCHC: 32.8 g/dL (ref 30.0–36.0)
MCV: 85.8 fL (ref 80.0–100.0)
Monocytes Absolute: 0.8 10*3/uL (ref 0.1–1.0)
Monocytes Relative: 11 %
Neutro Abs: 5.8 10*3/uL (ref 1.7–7.7)
Neutrophils Relative %: 77 %
Platelets: 249 10*3/uL (ref 150–400)
RBC: 2.95 MIL/uL — ABNORMAL LOW (ref 3.87–5.11)
RDW: 15 % (ref 11.5–15.5)
WBC: 7.5 10*3/uL (ref 4.0–10.5)
nRBC: 0 % (ref 0.0–0.2)

## 2019-12-29 LAB — COMPREHENSIVE METABOLIC PANEL
ALT: 9 U/L (ref 0–44)
AST: 15 U/L (ref 15–41)
Albumin: 2.2 g/dL — ABNORMAL LOW (ref 3.5–5.0)
Alkaline Phosphatase: 124 U/L (ref 38–126)
Anion gap: 10 (ref 5–15)
BUN: 18 mg/dL (ref 8–23)
CO2: 27 mmol/L (ref 22–32)
Calcium: 8.1 mg/dL — ABNORMAL LOW (ref 8.9–10.3)
Chloride: 98 mmol/L (ref 98–111)
Creatinine, Ser: 3.86 mg/dL — ABNORMAL HIGH (ref 0.44–1.00)
GFR calc Af Amer: 12 mL/min — ABNORMAL LOW (ref >60)
GFR calc non Af Amer: 11 mL/min — ABNORMAL LOW (ref >60)
Glucose, Bld: 154 mg/dL — ABNORMAL HIGH (ref 70–99)
Potassium: 3.7 mmol/L (ref 3.5–5.1)
Sodium: 135 mmol/L (ref 135–145)
Total Bilirubin: 0.9 mg/dL (ref 0.3–1.2)
Total Protein: 5.2 g/dL — ABNORMAL LOW (ref 6.5–8.1)

## 2019-12-29 LAB — GLUCOSE, CAPILLARY
Glucose-Capillary: 144 mg/dL — ABNORMAL HIGH (ref 70–99)
Glucose-Capillary: 148 mg/dL — ABNORMAL HIGH (ref 70–99)
Glucose-Capillary: 173 mg/dL — ABNORMAL HIGH (ref 70–99)
Glucose-Capillary: 218 mg/dL — ABNORMAL HIGH (ref 70–99)
Glucose-Capillary: 170 mg/dL — ABNORMAL HIGH (ref 70–99)

## 2019-12-29 LAB — PHOSPHORUS: Phosphorus: 3.6 mg/dL (ref 2.5–4.6)

## 2019-12-29 LAB — MAGNESIUM: Magnesium: 1.5 mg/dL — ABNORMAL LOW (ref 1.7–2.4)

## 2019-12-29 MED ORDER — FUROSEMIDE 10 MG/ML IJ SOLN
60.0000 mg | Freq: Once | INTRAMUSCULAR | Status: AC
  Administered 2019-12-29: 60 mg via INTRAVENOUS
  Filled 2019-12-29: qty 6

## 2019-12-29 MED ORDER — LOPERAMIDE HCL 2 MG PO CAPS
2.0000 mg | ORAL_CAPSULE | ORAL | Status: DC | PRN
  Administered 2019-12-29 – 2019-12-30 (×2): 2 mg via ORAL
  Filled 2019-12-29 (×2): qty 1

## 2019-12-29 MED ORDER — MAGNESIUM SULFATE 2 GM/50ML IV SOLN
2.0000 g | Freq: Once | INTRAVENOUS | Status: AC
  Administered 2019-12-29: 10:00:00 2 g via INTRAVENOUS
  Filled 2019-12-29: qty 50

## 2019-12-29 NOTE — Progress Notes (Signed)
PROGRESS NOTE    Katherine Oliver  KMM:[id number] DOB: April 16, 1946 DOA: 12/13/2019 PCP: [name], MD   Brief Narrative:  HPI from PCCM Patient is a 74 year old obese Caucasian female with a past medical significant for but not limited to gallstone pancreatitis and UTI.  She had an ERCP on 210 with stent placement and PCCM was consulted to admit worsening pancreatic pain, increased tachycardia and increased work of breathing supplemental oxygen use.  She had a worsening AKI.  General surgery was consulted due to increased intrabladder pressure and concern for possible abdominal compartment syndrome.  On 12/16/2019 when she went into A. fib with RVR and started on diltiazem and later became hypotensive and switched to amiodarone.  Patient also had an EGD done on 12/19/2019 due to possible GI bleed and EGD showed stress ulcerations.  Due to some improvement in her overall clinical status tried hospitalist assumed care on 12/21/2019.  Because her renal function has not improved significantly for her CKD she is started on hemodialysis and had a tunneled catheter placed.  Nephrology has now placed in order to remove her nontunneled catheter and they want to continue to follow for renal recovery though they have contacted the social worker for hemodialysis regarding outpatient unit should 1 be needed and this is in the process of being set up.  I would defer to nephrology for when they feel that she can be safely discharged.  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, without long-term current use of insulin (HCC)   HTN (hypertension)   Dyslipidemia associated with type 2 diabetes mellitus (HCC)   NAFLD (nonalcoholic fatty liver disease)   GERD (gastroesophageal reflux disease)   Polycythemia   Obesity, Class I, BMI 30-34.9   CKD stage 3 due to type 2 diabetes mellitus (HCC)   Acute gallstone pancreatitis   Sepsis ([place])   Acute  renal failure (ARF) ([place])   Acute respiratory failure (HCC)   Choledocholithiasis   Gallstone pancreatitis post ERCP with biliary stent placement Acute Hepatitis -LFTs improved and not checked the last few days -GI on board, continue supportive care and advance diet as tolerated -Continue PPI, Carafate -On discharge she will need to follow-up with GI to address her stent and repeat ERCP in the coming weeks  Acute blood loss anemia/GI bleeding 2/2 gastric/stress ulcers -Status post 3 units of PRBC, last transfusion of 1U of PRBC on 12/25/19 -LFTs improved and not checked the last few days -GI on board, continue supportive care and advance diet as tolerated -Continue PPI, Carafate -Continue to Monitor Blood Count and last few days her Hb/Hct has been dropping and went from 9.2/28.0 -> 8.5/26.0 -> 8.1/25.0; today her hemoglobin/hematocrit is 8.3/25.3 -Continue to monitor for signs and symptoms of bleeding -Repeat CBC in a.m.  AKI with oliguria/anuric/hyponatremia -In the setting of sepsis and severe acute pancreatitis -Status post CRRT, started on 2/11, stopped on 2/14 -Currently on HD (M/W/F) -Nephrology on board -S/P TDC on 12/27/19 by IR -Currently getting Dialyzed today -BUN/Cr worsened from 34/5.29 -> 40/6.02 and today it is improved after dialysis at 18/3.86 -Na+ was 128 and now improved to 135 -Further Care Per Nephrology -Outpatient HD referral made by the nephrology social worker just in case this patient would require this but nephrology still wants to continue to monitor to see if her renal function improves the next couple days and nephrology will continue to follow for renal recovery  -We will repeat  her lab work in the a.m.  Leukocytosis, improved -Remains afebrile, hemodynamically stable -No obvious signs of infection; her WBC is now 7.5 -Continue to monitor off antibiotics -C/w CBC  New onset A. fib with RVR -Possibly likely related to sepsis -Currently rate  controlled -Currently on oral Amiodarone 200 mg po Daily, may discontinue on discharge if continues to remain sinus rhythm  Thrombocytopenia -Resolved -Monitor closely for any signs of bleeding -Platelet Count went from 270 -> 245 -> 248 -> 249 -Continue to Monitor and Repeat CBC in AM   Acute hypoxemic and hypercarbic respiratory failure -Chest x-ray showed atelectasis -She was on supplemental oxygen but now weaned off -SpO2: 92 % O2 Flow Rate (L/min): 0.5 L/min -Continue incentive spirometry -Repeat CXR in AM   Enterococcus UTI -S/p Completed antibiotics  Diabetes Mellitus Type 2 -C/w SSI, Lantus, Accu-Cheks, hypoglycemic protocol -CBG's ranging from 89-173  Obesity -Estimated body mass index is 35.01 kg/m as calculated from the following:   Height as of this encounter: 5' 0.98" (1.549 m).   Weight as of this encounter: 84 kg. -Weight Loss and Dietary Counseling given   Physical deconditioning -PT/OT recommending Home Health -Out of bed as tolerated  Hypomagnesemia -Patient magnesium levels 1.5 -Replete with IV mag sulfate 2 g  -Continue monitor and replete as necessary -Repeat magnesium level in the a.m.  Diarrhea  -Hold her stool softeners -We will try Imodium -Continue monitor closely and I do not clinically suspect that this is infectious diarrhea she is afebrile and has no leukocytosis -If continues to worsen will need at least a GI pathogen panel  DVT prophylaxis: SCDs Code Status: FULL CODE  Family Communication: No family present at bedside  Disposition Plan: Anticipate discharging home with home health next few days now that she is being dialyzed.  She will need to have a seat for outpatient dialysis and renal wants to observe her for a few more days to ensure that her renal function does not recover prior to her getting set up with outpatient dialysis.  Consultants:   Gastroenterology  Nephrology   Procedures:    Antimicrobials:   Anti-infectives (From admission, onward)   Start     Dose/Rate Route Frequency Ordered Stop   12/27/19 0930  ceFAZolin (ANCEF) IVPB 2g/100 mL premix     2 g 200 mL/hr over 30 Minutes Intravenous  Once 12/27/19 0809 12/27/19 0910   12/27/19 0842  ceFAZolin (ANCEF) 2-4 GM/100ML-% IVPB    Note to Pharmacy: [place]   : cabinet override      12/27/19 0842 12/27/19 2044   12/15/19 2100  piperacillin-tazobactam (ZOSYN) IVPB 3.375 g  Status:  Discontinued     3.375 g 12.5 mL/hr over 240 Minutes Intravenous Every 6 hours 12/15/19 1717 12/15/19 1718   12/15/19 2100  piperacillin-tazobactam (ZOSYN) IVPB 3.375 g  Status:  Discontinued     3.375 g 100 mL/hr over 30 Minutes Intravenous Every 6 hours 12/15/19 1718 12/17/19 0849   12/15/19 1500  piperacillin-tazobactam (ZOSYN) IVPB 2.25 g  Status:  Discontinued     2.25 g 100 mL/hr over 30 Minutes Intravenous Every 6 hours 12/15/19 1452 12/15/19 1717   12/13/19 2200  piperacillin-tazobactam (ZOSYN) IVPB 3.375 g  Status:  Discontinued     3.375 g 12.5 mL/hr over 240 Minutes Intravenous Every 8 hours 12/13/19 2124 12/15/19 1452     Subjective: Seen and examined at bedside and states that she is doing okay still having some abdominal pain on the left side.  No nausea or vomiting but complained of significant diarrhea and having at least 5 bowel movements this morning.  No chest pain, lightheadedness or dizziness.  No other concerns complaints at this time.  Objective: Vitals:   12/28/19 1844 12/28/19 2123 12/29/19 0433 12/29/19 1449  BP: (!) 140/54 (!) 147/59 (!) 148/71 (!) 144/58  Pulse: (!) 109 (!) 106 90 (!) 101  Resp: 18 15 15 20   Temp: 98.3 F (36.8 C) (!) 97.5 F (36.4 C) 98.4 F (36.9 C) 98.2 F (36.8 C)  TempSrc: Oral Oral Oral Oral  SpO2: 94% 91% 98% 92%  Weight:      Height:        Intake/Output Summary (Last 24 hours) at 12/29/2019 1518 Last data filed at 12/29/2019 0093 Gross per 24 hour  Intake 360 ml  Output 1850 ml   Net -1490 ml   Filed Weights   12/26/19 1123 12/28/19 1513 12/28/19 1829  Weight: 81.4 kg 85.7 kg 84 kg   Examination: Physical Exam:  Constitutional: WN/WD Caucasian female currently in no acute distress but does appear a little uncomfortable Eyes: Lids and conjunctivae normal, sclerae anicteric  ENMT: External Ears, Nose appear normal. Grossly normal hearing. Neck: Appears normal, supple, no cervical masses, normal ROM, no appreciable thyromegaly; no JVD Respiratory: Mildly diminished to auscultation bilaterally, no wheezing, rales, rhonchi or crackles. Normal respiratory effort and patient is not tachypenic. No accessory muscle use.  Unlabored breathing or any supplemental oxygen via nasal cannula Cardiovascular: RRR, no murmurs / rubs / gallops. S1 and S2 auscultated. Mild extremity edema. Abdomen: Soft, non-tender, non-distended. No masses palpated. No appreciable hepatosplenomegaly. Bowel sounds positive.  GU: Deferred. Musculoskeletal: No clubbing / cyanosis of digits/nails. No joint deformity upper and lower extremities. Skin: No rashes, lesions, ulcers on a limited skin evaluation. No induration; Warm and dry.  Neurologic: CN 2-12 grossly intact with no focal deficits. Romberg sign and cerebellar reflexes not assessed.  Psychiatric: Normal judgment and insight. Alert and oriented x 3. Normal mood and appropriate affect.   Data Reviewed: I have personally reviewed following labs and imaging studies  CBC: Recent Labs  Lab 12/25/19 0349 12/25/19 0349 12/25/19 1705 12/26/19 0417 12/27/19 0405 12/28/19 0420 12/29/19 0210  WBC 11.2*  --   --  11.2* 8.3 7.5 7.5  NEUTROABS 8.8*  --   --  8.8* 6.1 5.6 5.8  HGB 7.1*   < > 9.4* 9.2* 8.5* 8.1* 8.3*  HCT 22.8*   < > 28.4* 28.0* 26.0* 25.0* 25.3*  MCV 89.8  --   --  86.7 87.5 86.8 85.8  PLT 220  --   --  270 245 248 249   < > = values in this interval not displayed.   Basic Metabolic Panel: Recent Labs  Lab 12/25/19 0349  12/26/19 0417 12/27/19 0405 12/28/19 0420 12/29/19 0210  NA 128* 123* 130* 128* 135  K 3.8 3.9 3.6 3.4* 3.7  CL 89* 86* 92* 92* 98  CO2 24 20* 23 22 27   GLUCOSE 150* 120* 130* 137* 154*  BUN 46* 63* 34* 40* 18  CREATININE 6.17* 7.43* 5.29* 6.02* 3.86*  CALCIUM 7.9* 7.9* 8.0* 8.1* 8.1*  MG  --   --   --   --  1.5*  PHOS 7.2* 8.4* 5.9* 6.0* 3.6   GFR: Estimated Creatinine Clearance: 12.4 mL/min (A) (by C-G formula based on SCr of 3.86 mg/dL (H)). Liver Function Tests: Recent Labs  Lab 12/25/19 0349 12/26/19 0417 12/27/19 0405 12/28/19 0420 12/29/19  0210  AST  --   --   --   --  15  ALT  --   --   --   --  9  ALKPHOS  --   --   --   --  124  BILITOT  --   --   --   --  0.9  PROT  --   --   --   --  5.2*  ALBUMIN 2.0* 2.1* 2.0* 2.0* 2.2*   No results for input(s): LIPASE, AMYLASE in the last 168 hours. No results for input(s): AMMONIA in the last 168 hours. Coagulation Profile: No results for input(s): INR, PROTIME in the last 168 hours. Cardiac Enzymes: No results for input(s): CKTOTAL, CKMB, CKMBINDEX, TROPONINI in the last 168 hours. BNP (last 3 results) Recent Labs    12/29/18 1531  PROBNP 26.0   HbA1C: No results for input(s): HGBA1C in the last 72 hours. CBG: Recent Labs  Lab 12/28/19 1841 12/28/19 2124 12/29/19 0732 12/29/19 0740 12/29/19 1124  GLUCAP 89 199* 148* 144* 173*   Lipid Profile: No results for input(s): CHOL, HDL, LDLCALC, TRIG, CHOLHDL, LDLDIRECT in the last 72 hours. Thyroid Function Tests: No results for input(s): TSH, T4TOTAL, FREET4, T3FREE, THYROIDAB in the last 72 hours. Anemia Panel: No results for input(s): VITAMINB12, FOLATE, FERRITIN, TIBC, IRON, RETICCTPCT in the last 72 hours. Sepsis Labs: No results for input(s): PROCALCITON, LATICACIDVEN in the last 168 hours.  No results found for this or any previous visit (from the past 240 hour(s)).   RN Pressure Injury Documentation:     Estimated body mass index is 35.01 kg/m  as calculated from the following:   Height as of this encounter: 5' 0.98" (1.549 m).   Weight as of this encounter: 84 kg.  Malnutrition Type:  Nutrition Problem: Inadequate oral intake Etiology: altered GI function, acute illness   Malnutrition Characteristics:  Signs/Symptoms: NPO status   Nutrition Interventions:  Interventions: Nepro shake, MVI, Magic cup   Radiology Studies: No results found. Scheduled Meds: . amiodarone  200 mg Oral Daily  . Chlorhexidine Gluconate Cloth  6 each Topical Daily  . Chlorhexidine Gluconate Cloth  6 each Topical Q0600  . feeding supplement (NEPRO CARB STEADY)  237 mL Oral TID BM  . fluticasone  1 spray Each Nare Daily  . insulin aspart  0-5 Units Subcutaneous QHS  . insulin aspart  0-9 Units Subcutaneous TID WC  . insulin glargine  14 Units Subcutaneous Daily  . multivitamin  1 tablet Oral QHS  . pantoprazole  40 mg Oral BID AC  . sodium chloride flush  10-40 mL Intracatheter Q12H  . sucralfate  1 g Oral Q6H   Continuous Infusions: . sodium chloride 10 mL/hr at 12/27/19 1600    LOS: 16 days   Kerney Elbe, DO [facility] PAGER is on AMION  If 7PM-7AM, please contact night-coverage [email or web address]

## 2019-12-29 NOTE — Progress Notes (Signed)
Physical Therapy Treatment Patient Details Name: Katherine Oliver MRN: [id number] DOB: January 13, 1946 Today's Date: 12/29/2019    History of Present Illness 74 yo admitted 2/9 with pancreatitis and UTI s/p ERCP with Afib 2/12. PMHx: DM, HTN, CKD, obesity, sepsis, ARF, Rt TKA    PT Comments    Pt in recliner on arrival reporting incontinence of stool. Pt assisted to [facility] and assist with pericare and linen change due to diarrhea. Pt able to tolerate HEp in sitting and limited gait due to fatigue and fear of voiding. Pt with SpO2 89-92% on RA during session. Will continue to follow and encouraged increased mobility as bowel issues will allow.     Follow Up Recommendations  Home health PT;Supervision for mobility/OOB     Equipment Recommendations  None recommended by PT    Recommendations for Other Services       Precautions / Restrictions Precautions Precautions: Fall Precaution Comments: watch O2 Restrictions Weight Bearing Restrictions: No    Mobility  Bed Mobility Overal bed mobility: Modified Independent Bed Mobility: Sit to Supine           General bed mobility comments: pt transferred from sit to supine without assist with HOB 10 degrees  Transfers Overall transfer level: Needs assistance Equipment used: Rolling walker (2 wheeled) Transfers: Sit to/from Stand Sit to Stand: Supervision         General transfer comment: supervision for safety with good hand placement. pt transferred from recliner to [facility] then stood from [facility] x 3.Pt limited by diarrhea incontinence  Ambulation/Gait Ambulation/Gait assistance: Supervision Gait Distance (Feet): 40 Feet Assistive device: Rolling walker (2 wheeled) Gait Pattern/deviations: Step-through pattern;Decreased stride length   Gait velocity interpretation: >2.62 ft/sec, indicative of community ambulatory General Gait Details: pt with SPO2 90% on Ra with gait limited by diarrhea and fatigue   Stairs              Wheelchair Mobility    Modified Rankin (Stroke Patients Only)       Balance Overall balance assessment: Mild deficits observed, not formally tested                                          Cognition Arousal/Alertness: Awake/alert Behavior During Therapy: WFL for tasks assessed/performed Overall Cognitive Status: Within Functional Limits for tasks assessed                                 General Comments: Pt required min encouragement to participate in therapy.      Exercises General Exercises - Lower Extremity Long Arc Quad: AROM;Both;Seated;20 reps Hip Flexion/Marching: AROM;Both;Seated;15 reps    General Comments General comments (skin integrity, edema, etc.): Pt on room air with SpO2 maintaining in the 90s.       Pertinent Vitals/Pain Pain Assessment: No/denies pain Pain Score: 8  Pain Location: left flank and low back Pain Descriptors / Indicators: Aching Pain Intervention(s): Monitored during session;Repositioned    Home Living                      Prior Function            PT Goals (current goals can now be found in the care plan section) Progress towards PT goals: Not progressing toward goals - comment(limited by diarrhea)    Frequency  Min 3X/week      PT Plan Current plan remains appropriate    Co-evaluation              AM-PAC PT "6 Clicks" Mobility   Outcome Measure  Help needed turning from your back to your side while in a flat bed without using bedrails?: None Help needed moving from lying on your back to sitting on the side of a flat bed without using bedrails?: None Help needed moving to and from a bed to a chair (including a wheelchair)?: A Little Help needed standing up from a chair using your arms (e.g., wheelchair or bedside chair)?: A Little Help needed to walk in hospital room?: A Little Help needed climbing 3-5 steps with a railing? : A Little 6 Click Score: 20    End of  Session   Activity Tolerance: Patient limited by fatigue Patient left: in bed;with call bell/phone within reach Nurse Communication: Mobility status PT Visit Diagnosis: Other abnormalities of gait and mobility (R26.89)     Time: 8251-8984 PT Time Calculation (min) (ACUTE ONLY): 31 min  Charges:  $Therapeutic Exercise: 8-22 mins $Therapeutic Activity: 8-22 mins                     Katherine Oliver P, PT Acute Rehabilitation Services Pager: [phone] Office: [place] Katherine Oliver 12/29/2019, 12:57 PM

## 2019-12-29 NOTE — Progress Notes (Signed)
Nephrology Progress Note:   Patient ID: Katherine Oliver, female   DOB: June 26, 1946, 74 y.o.   MRN: [id number]    S:  Had 650 mL UOP over 2/24.  Had 1.5 liters UF with HD on 2/24.  Had been having loose stools.  She's glad about trying to hold HD for tomorrow.  Review of systems   Shortness of breath last night  Continues with nausea  Intermittently Urine output went up   O:BP (!) 144/58   Pulse (!) 101   Temp 98.2 F (36.8 C) (Oral)   Resp 20   Ht 5' 0.98" (1.549 m)   Wt 84 kg   SpO2 92%   BMI 35.01 kg/m   Intake/Output Summary (Last 24 hours) at 12/29/2019 1811 Last data filed at 12/29/2019 1700 Gross per 24 hour  Intake 840 ml  Output 1850 ml  Net -1010 ml   Intake/Output: I/O last 3 completed shifts: In: 600 [P.O.:600] Out: 2600 [Urine:1100; Other:1500]  Intake/Output this shift:  Total I/O In: 240 [P.O.:240] Out: -  Weight change:    Physical exam:   Gen: adult female NAD CVS: S1S2 no rub Resp: cta but reduced unlabored  Abd: soft, nontender; obese habitus Ext: no pitting edema Neuro alert and oriented x3 ; provides hx and follows commands  Access left chest tunneled catheter    Recent Labs  Lab 12/23/19 0420 12/24/19 0402 12/25/19 0349 12/26/19 0417 12/27/19 0405 12/28/19 0420 12/29/19 0210  NA 127* 129* 128* 123* 130* 128* 135  K 3.6 3.6 3.8 3.9 3.6 3.4* 3.7  CL 93* 91* 89* 86* 92* 92* 98  CO2 21* 25 24 20* 23 22 27   GLUCOSE 187* 169* 150* 120* 130* 137* 154*  BUN 60* 27* 46* 63* 34* 40* 18  CREATININE 7.24* 4.61* 6.17* 7.43* 5.29* 6.02* 3.86*  ALBUMIN 2.0* 2.0* 2.0* 2.1* 2.0* 2.0* 2.2*  CALCIUM 7.8* 7.9* 7.9* 7.9* 8.0* 8.1* 8.1*  PHOS 7.3* 6.0* 7.2* 8.4* 5.9* 6.0* 3.6  AST  --   --   --   --   --   --  15  ALT  --   --   --   --   --   --  9   Liver Function Tests: Recent Labs  Lab 12/27/19 0405 12/28/19 0420 12/29/19 0210  AST  --   --  15  ALT  --   --  9  ALKPHOS  --   --  124  BILITOT  --   --  0.9  PROT  --   --  5.2*   ALBUMIN 2.0* 2.0* 2.2*   CBC: Recent Labs  Lab 12/25/19 0349 12/25/19 1705 12/26/19 0417 12/26/19 0417 12/27/19 0405 12/28/19 0420 12/29/19 0210  WBC 11.2*  --  11.2*   < > 8.3 7.5 7.5  NEUTROABS 8.8*  --  8.8*   < > 6.1 5.6 5.8  HGB 7.1*   < > 9.2*   < > 8.5* 8.1* 8.3*  HCT 22.8*   < > 28.0*   < > 26.0* 25.0* 25.3*  MCV 89.8  --  86.7  --  87.5 86.8 85.8  PLT 220  --  270   < > 245 248 249   < > = values in this interval not displayed.   Cardiac Enzymes: No results for input(s): CKTOTAL, CKMB, CKMBINDEX, TROPONINI in the last 168 hours. CBG: Recent Labs  Lab 12/28/19 2124 12/29/19 0732 12/29/19 0740 12/29/19 1124 12/29/19 1724  GLUCAP  199* 148* 144* 173* 218*    Studies/Results: No results found. [place] Kitchen amiodarone  200 mg Oral Daily  . Chlorhexidine Gluconate Cloth  6 each Topical Daily  . Chlorhexidine Gluconate Cloth  6 each Topical Q0600  . feeding supplement (NEPRO CARB STEADY)  237 mL Oral TID BM  . fluticasone  1 spray Each Nare Daily  . insulin aspart  0-5 Units Subcutaneous QHS  . insulin aspart  0-9 Units Subcutaneous TID WC  . insulin glargine  14 Units Subcutaneous Daily  . multivitamin  1 tablet Oral QHS  . pantoprazole  40 mg Oral BID AC  . sodium chloride flush  10-40 mL Intracatheter Q12H  . sucralfate  1 g Oral Q6H    BMET    Component Value Date/Time   NA 135 12/29/2019 0210   K 3.7 12/29/2019 0210   CL 98 12/29/2019 0210   CO2 27 12/29/2019 0210   GLUCOSE 154 (H) 12/29/2019 0210   BUN 18 12/29/2019 0210   CREATININE 3.86 (H) 12/29/2019 0210   CREATININE 1.03 10/20/2013 1751   CALCIUM 8.1 (L) 12/29/2019 0210   GFRNONAA 11 (L) 12/29/2019 0210   GFRAA 12 (L) 12/29/2019 0210   CBC    Component Value Date/Time   WBC 7.5 12/29/2019 0210   RBC 2.95 (L) 12/29/2019 0210   HGB 8.3 (L) 12/29/2019 0210   HCT 25.3 (L) 12/29/2019 0210   PLT 249 12/29/2019 0210   MCV 85.8 12/29/2019 0210   MCH 28.1 12/29/2019 0210   MCHC 32.8 12/29/2019 0210    RDW 15.0 12/29/2019 0210   LYMPHSABS 0.7 12/29/2019 0210   MONOABS 0.8 12/29/2019 0210   EOSABS 0.0 12/29/2019 0210   BASOSABS 0.0 12/29/2019 0210    Assessment/Plan:  1. AKI- in setting of sepsis and severe acute pancreatitis +/- IV contrast exposure. CRRT started on 12/15/19. Stopped 12/18/19. Remains oliguric/anuric.Had difficulty with HD2/17/21 with poorly functioningtrialysis catheter but worked better 12/23/19. - Hold HD on 2/26 unless needed urgently - increase in urine output noted - Anticipate need for HD on 2/27 likely though increased output is encouraging) - Lasix IV once now - Will continue to follow for renal recovery though have contacted SW for regarding outpatient AKI unit should one be needed.  Actively monitoring for recovery 2. Acute gallstone pacreatitis- s/p ERCP with biliary stent placement.  3. Anemia of critical illness and GIB- stable. Continue to follow and transfuse prn.  aranesp 40 mcg once on 2/23.  4. Atrial fibrillation with RVR-  Per primary team  5. Leukocytosis- resolved. 6. Hyponatremia - resolved with HD 7. HTN - acceptable   Claudia Desanctis, MD 12/29/2019 6:24 PM

## 2019-12-29 NOTE — Progress Notes (Signed)
Occupational Therapy Treatment Patient Details Name: Katherine Oliver MRN: [id number] DOB: [birth year] Today's Date: 12/29/2019    History of present illness 74 yo admitted 2/9 with pancreatitis and UTI s/p ERCP with Afib 2/12. PMHx: DM, HTN, CKD, obesity, sepsis, ARF, Rt TKA   OT comments  Pt making good progress in therapy, demonstrating improved strength and activity tolerance this date. Pt able to ambulate to/from bathroom with RW and supervision. Noted 0 instances of loss of balance. Pt completed toileting task and tolerated standing 1 x 8 min at the sink to complete grooming/hygiene tasks. Educated pt on safety strategies, fall prevention, and energy conservation techniques with good understanding. Pt on room air with SpO2 maintaining in 90s throughout. Pt reported min fatigue following tasks. OT will continue to follow acutely.    Follow Up Recommendations  Home health OT;Supervision - Intermittent    Equipment Recommendations  3 in 1 bedside commode    Recommendations for Other Services      Precautions / Restrictions Precautions Precautions: Fall Precaution Comments: watch O2 Restrictions Weight Bearing Restrictions: No       Mobility Bed Mobility Overal bed mobility: Modified Independent Bed Mobility: Supine to Sit           General bed mobility comments: HOB elevated, use of bedrails.   Transfers Overall transfer level: Needs assistance Equipment used: Rolling walker (2 wheeled) Transfers: Sit to/from Stand Sit to Stand: Supervision         General transfer comment: Cues for hand placement    Balance Overall balance assessment: Mild deficits observed, not formally tested                                         ADL either performed or assessed with clinical judgement   ADL Overall ADL's : Needs assistance/impaired     Grooming: Set up;Supervision/safety;Standing;Wash/dry hands;Wash/dry face;Oral care;Brushing  hair Grooming Details (indicate cue type and reason): While standing at the sink                 Toilet Transfer: Supervision/safety;Set up;Ambulation;Regular Toilet;Grab bars   Toileting- Clothing Manipulation and Hygiene: Supervision/safety;Set up;Sit to/from stand       Functional mobility during ADLs: Supervision/safety;Rolling walker General ADL Comments: Pt able to ambulate to/from bathroom with RW and supervision. Noted 0 instances of LOB. Pt tolerated standing 1 x 8 min at the sink to complete grooming/hygiene tasks.      Vision       Perception     Praxis      Cognition Arousal/Alertness: Awake/alert Behavior During Therapy: WFL for tasks assessed/performed Overall Cognitive Status: Within Functional Limits for tasks assessed                                 General Comments: Pt required min encouragement to participate in therapy.        Exercises     Shoulder Instructions       General Comments Pt on room air with SpO2 maintaining in the 90s.     Pertinent Vitals/ Pain       Pain Assessment: 0-10 Pain Score: 8  Pain Location: left flank and low back Pain Descriptors / Indicators: Aching Pain Intervention(s): Monitored during session;Repositioned  Home Living  Prior Functioning/Environment              Frequency           Progress Toward Goals  OT Goals(current goals can now be found in the care plan section)  Progress towards OT goals: Progressing toward goals  ADL Goals Pt Will Perform Grooming: with modified independence;sitting;standing Pt Will Perform Upper Body Dressing: with modified independence;sitting Pt Will Perform Lower Body Dressing: with modified independence;sit to/from stand;sitting/lateral leans;with adaptive equipment Pt Will Transfer to Toilet: with modified independence;ambulating;grab bars;regular height toilet Pt Will Perform Toileting  - Clothing Manipulation and hygiene: with modified independence;sitting/lateral leans;sit to/from stand  Plan Discharge plan remains appropriate    Co-evaluation                 AM-PAC OT "6 Clicks" Daily Activity     Outcome Measure   Help from another person eating meals?: None Help from another person taking care of personal grooming?: A Little Help from another person toileting, which includes using toliet, bedpan, or urinal?: A Little Help from another person bathing (including washing, rinsing, drying)?: A Lot Help from another person to put on and taking off regular upper body clothing?: A Little Help from another person to put on and taking off regular lower body clothing?: A Lot 6 Click Score: 17    End of Session Equipment Utilized During Treatment: Rolling walker  OT Visit Diagnosis: Other abnormalities of gait and mobility (R26.89);Pain;Muscle weakness (generalized) (M62.81) Pain - Right/Left: Left Pain - part of body: (flank and back)   Activity Tolerance Patient tolerated treatment well   Patient Left in bed;with call bell/phone within reach   Nurse Communication Mobility status        Time: 7169-6789 OT Time Calculation (min): 32 min  Charges: OT General Charges $OT Visit: 1 Visit OT Treatments $Self Care/Home Management : 8-22 mins $Therapeutic Activity: 8-22 mins  Mauri Brooklyn OTR/L [phone]   Mauri Brooklyn 12/29/2019, 10:14 AM

## 2019-12-30 LAB — COMPREHENSIVE METABOLIC PANEL
ALT: 6 U/L (ref 0–44)
AST: 15 U/L (ref 15–41)
Albumin: 2.3 g/dL — ABNORMAL LOW (ref 3.5–5.0)
Alkaline Phosphatase: 114 U/L (ref 38–126)
Anion gap: 12 (ref 5–15)
BUN: 33 mg/dL — ABNORMAL HIGH (ref 8–23)
CO2: 24 mmol/L (ref 22–32)
Calcium: 8.3 mg/dL — ABNORMAL LOW (ref 8.9–10.3)
Chloride: 97 mmol/L — ABNORMAL LOW (ref 98–111)
Creatinine, Ser: 4.53 mg/dL — ABNORMAL HIGH (ref 0.44–1.00)
GFR calc Af Amer: 10 mL/min — ABNORMAL LOW (ref >60)
GFR calc non Af Amer: 9 mL/min — ABNORMAL LOW (ref >60)
Glucose, Bld: 175 mg/dL — ABNORMAL HIGH (ref 70–99)
Potassium: 3.7 mmol/L (ref 3.5–5.1)
Sodium: 133 mmol/L — ABNORMAL LOW (ref 135–145)
Total Bilirubin: 0.6 mg/dL (ref 0.3–1.2)
Total Protein: 5.2 g/dL — ABNORMAL LOW (ref 6.5–8.1)

## 2019-12-30 LAB — GLUCOSE, CAPILLARY
Glucose-Capillary: 132 mg/dL — ABNORMAL HIGH (ref 70–99)
Glucose-Capillary: 155 mg/dL — ABNORMAL HIGH (ref 70–99)
Glucose-Capillary: 147 mg/dL — ABNORMAL HIGH (ref 70–99)
Glucose-Capillary: 152 mg/dL — ABNORMAL HIGH (ref 70–99)

## 2019-12-30 LAB — CBC WITH DIFFERENTIAL/PLATELET
Abs Immature Granulocytes: 0.15 10*3/uL — ABNORMAL HIGH (ref 0.00–0.07)
Basophils Absolute: 0.1 10*3/uL (ref 0.0–0.1)
Basophils Relative: 1 %
Eosinophils Absolute: 0 10*3/uL (ref 0.0–0.5)
Eosinophils Relative: 0 %
HCT: 25.8 % — ABNORMAL LOW (ref 36.0–46.0)
Hemoglobin: 8.3 g/dL — ABNORMAL LOW (ref 12.0–15.0)
Immature Granulocytes: 2 %
Lymphocytes Relative: 8 %
Lymphs Abs: 0.8 10*3/uL (ref 0.7–4.0)
MCH: 28 pg (ref 26.0–34.0)
MCHC: 32.2 g/dL (ref 30.0–36.0)
MCV: 87.2 fL (ref 80.0–100.0)
Monocytes Absolute: 0.7 10*3/uL (ref 0.1–1.0)
Monocytes Relative: 7 %
Neutro Abs: 8.1 10*3/uL — ABNORMAL HIGH (ref 1.7–7.7)
Neutrophils Relative %: 82 %
Platelets: 239 10*3/uL (ref 150–400)
RBC: 2.96 MIL/uL — ABNORMAL LOW (ref 3.87–5.11)
RDW: 15.4 % (ref 11.5–15.5)
WBC: 9.8 10*3/uL (ref 4.0–10.5)
nRBC: 0 % (ref 0.0–0.2)

## 2019-12-30 LAB — PHOSPHORUS: Phosphorus: 4.5 mg/dL (ref 2.5–4.6)

## 2019-12-30 LAB — MAGNESIUM: Magnesium: 1.9 mg/dL (ref 1.7–2.4)

## 2019-12-30 MED ORDER — FUROSEMIDE 10 MG/ML IJ SOLN
60.0000 mg | Freq: Once | INTRAMUSCULAR | Status: AC
  Administered 2019-12-30: 60 mg via INTRAVENOUS
  Filled 2019-12-30: qty 6

## 2019-12-30 NOTE — Progress Notes (Signed)
Nephrology Progress Note:   Patient ID: Katherine Oliver, female   DOB: 1946-06-14, 74 y.o.   MRN: [id number]    S:  Had 850 mL UOP over 2/24.  Had 1.5 liters UF with HD on 2/24.  labs weren't drawn this AM and are still pending   Review of systems    Shortness of breath was better last night  Continues with nausea  Intermittently Urine output went up Loose stools   O:BP (!) 142/60 (BP Location: Right Arm)   Pulse 94   Temp 98.4 F (36.9 C) (Oral)   Resp 17   Ht 5' 0.98" (1.549 m)   Wt 84 kg   SpO2 95%   BMI 35.01 kg/m   Intake/Output Summary (Last 24 hours) at 12/30/2019 1433 Last data filed at 12/30/2019 1403 Gross per 24 hour  Intake 947.45 ml  Output 950 ml  Net -2.55 ml   Intake/Output: I/O last 3 completed shifts: In: 1187.5 [P.O.:840; I.V.:297.5; IV Piggyback:50] Out: 1000 [Urine:1000]  Intake/Output this shift:  Total I/O In: 480 [P.O.:480] Out: 300 [Urine:300] Weight change:    Physical exam:   Gen: adult female NAD CVS: S1S2 no rub Resp: cta but reduced unlabored  Abd: soft, nontender; obese habitus Ext: no pitting edema Neuro alert and oriented x3 ; provides hx and follows commands  Access left chest tunneled catheter    Recent Labs  Lab 12/24/19 0402 12/25/19 0349 12/26/19 0417 12/27/19 0405 12/28/19 0420 12/29/19 0210  NA 129* 128* 123* 130* 128* 135  K 3.6 3.8 3.9 3.6 3.4* 3.7  CL 91* 89* 86* 92* 92* 98  CO2 25 24 20* 23 22 27   GLUCOSE 169* 150* 120* 130* 137* 154*  BUN 27* 46* 63* 34* 40* 18  CREATININE 4.61* 6.17* 7.43* 5.29* 6.02* 3.86*  ALBUMIN 2.0* 2.0* 2.1* 2.0* 2.0* 2.2*  CALCIUM 7.9* 7.9* 7.9* 8.0* 8.1* 8.1*  PHOS 6.0* 7.2* 8.4* 5.9* 6.0* 3.6  AST  --   --   --   --   --  15  ALT  --   --   --   --   --  9   Liver Function Tests: Recent Labs  Lab 12/27/19 0405 12/28/19 0420 12/29/19 0210  AST  --   --  15  ALT  --   --  9  ALKPHOS  --   --  124  BILITOT  --   --  0.9  PROT  --   --  5.2*  ALBUMIN 2.0* 2.0*  2.2*   CBC: Recent Labs  Lab 12/25/19 0349 12/25/19 1705 12/26/19 0417 12/26/19 0417 12/27/19 0405 12/28/19 0420 12/29/19 0210  WBC 11.2*  --  11.2*   < > 8.3 7.5 7.5  NEUTROABS 8.8*  --  8.8*   < > 6.1 5.6 5.8  HGB 7.1*   < > 9.2*   < > 8.5* 8.1* 8.3*  HCT 22.8*   < > 28.0*   < > 26.0* 25.0* 25.3*  MCV 89.8  --  86.7  --  87.5 86.8 85.8  PLT 220  --  270   < > 245 248 249   < > = values in this interval not displayed.   Cardiac Enzymes: No results for input(s): CKTOTAL, CKMB, CKMBINDEX, TROPONINI in the last 168 hours. CBG: Recent Labs  Lab 12/29/19 1124 12/29/19 1724 12/29/19 2044 12/30/19 0738 12/30/19 1141  GLUCAP 173* 218* 170* 132* 155*    Studies/Results: No results found. Marland Kitchen  amiodarone  200 mg Oral Daily  . Chlorhexidine Gluconate Cloth  6 each Topical Daily  . Chlorhexidine Gluconate Cloth  6 each Topical Q0600  . feeding supplement (NEPRO CARB STEADY)  237 mL Oral TID BM  . fluticasone  1 spray Each Nare Daily  . insulin aspart  0-5 Units Subcutaneous QHS  . insulin aspart  0-9 Units Subcutaneous TID WC  . insulin glargine  14 Units Subcutaneous Daily  . multivitamin  1 tablet Oral QHS  . pantoprazole  40 mg Oral BID AC  . sodium chloride flush  10-40 mL Intracatheter Q12H  . sucralfate  1 g Oral Q6H    BMET    Component Value Date/Time   NA 135 12/29/2019 0210   K 3.7 12/29/2019 0210   CL 98 12/29/2019 0210   CO2 27 12/29/2019 0210   GLUCOSE 154 (H) 12/29/2019 0210   BUN 18 12/29/2019 0210   CREATININE 3.86 (H) 12/29/2019 0210   CREATININE 1.03 10/20/2013 1751   CALCIUM 8.1 (L) 12/29/2019 0210   GFRNONAA 11 (L) 12/29/2019 0210   GFRAA 12 (L) 12/29/2019 0210   CBC    Component Value Date/Time   WBC 7.5 12/29/2019 0210   RBC 2.95 (L) 12/29/2019 0210   HGB 8.3 (L) 12/29/2019 0210   HCT 25.3 (L) 12/29/2019 0210   PLT 249 12/29/2019 0210   MCV 85.8 12/29/2019 0210   MCH 28.1 12/29/2019 0210   MCHC 32.8 12/29/2019 0210   RDW 15.0  12/29/2019 0210   LYMPHSABS 0.7 12/29/2019 0210   MONOABS 0.8 12/29/2019 0210   EOSABS 0.0 12/29/2019 0210   BASOSABS 0.0 12/29/2019 0210    Assessment/Plan:  1. AKI- in setting of sepsis and severe acute pancreatitis +/- IV contrast exposure. Baseline Cr 1 -1.2. CRRT started on 12/15/19 - 2/14 then transitioned to [place] [facility] today.  Will follow-up her labs today - Anticipate may need for HD on 2/27 likely though increased output is encouraging - Will continue to follow inpatient for renal recovery though she has an AKI spot at [facility] TTS at 11:20 am with 10:40 arrival for first treatment time if one is needed.  Actively monitoring here for recovery 2. Acute gallstone pacreatitis- s/p ERCP with biliary stent placement.  3. Anemia of critical illness and GIB- stable. Continue to follow and transfuse prn.  aranesp 40 mcg once on 2/23.  4. Atrial fibrillation  - with hx RVR.  Per primary team  5. Leukocytosis- resolved. 6. Hyponatremia - resolved with HD 7. HTN - acceptable   Claudia Desanctis, MD 12/30/2019 2:58 PM

## 2019-12-30 NOTE — Progress Notes (Signed)
PROGRESS NOTE    Katherine Oliver  BOF:[id number] DOB: 1945-12-01 DOA: 12/13/2019 PCP: [name], MD   Brief Narrative:  HPI from PCCM Patient is a 74 year old obese Caucasian female with a past medical significant for but not limited to gallstone pancreatitis and UTI.  She had an ERCP on 210 with stent placement and PCCM was consulted to admit worsening pancreatic pain, increased tachycardia and increased work of breathing supplemental oxygen use.  She had a worsening AKI.  General surgery was consulted due to increased intrabladder pressure and concern for possible abdominal compartment syndrome.  On 12/16/2019 when she went into A. fib with RVR and started on diltiazem and later became hypotensive and switched to amiodarone.  Patient also had an EGD done on 12/19/2019 due to possible GI bleed and EGD showed stress ulcerations.  Due to some improvement in her overall clinical status tried hospitalist assumed care on 12/21/2019.  Because her renal function has not improved significantly for her CKD she is started on hemodialysis and had a tunneled catheter placed.  Nephrology has now placed in order to remove her nontunneled catheter and they want to continue to follow for renal recovery though they have contacted the social worker for hemodialysis regarding outpatient unit should one be needed and this is in the process of being set up.  Nephrology wants to watch the patient little bit closer and she may get dialyzed tomorrow as her BUN/creatinine is still slightly worsening however patient is making more urine output today..  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, without long-term current use of insulin (HCC)   HTN (hypertension)   Dyslipidemia associated with type 2 diabetes mellitus (HCC)   NAFLD (nonalcoholic fatty liver disease)   GERD (gastroesophageal reflux disease)   Polycythemia   Obesity, Class I, BMI 30-34.9   CKD stage 3 due to type 2 diabetes mellitus (HCC)   Acute gallstone pancreatitis   Sepsis ([place])   Acute renal failure (ARF) ([place])   Acute respiratory failure (HCC)   Choledocholithiasis   Gallstone pancreatitis post ERCP with biliary stent placement Acute Hepatitis -LFTs improved and not checked the last few days but today her AST is 15 and her ALT is 6 -GI on board, continue supportive care and advance diet as tolerated -Continue PPI, Carafate -On discharge she will need to follow-up with GI to address her stent and repeat ERCP in the coming weeks  Acute blood loss anemia/GI bleeding 2/2 gastric/stress ulcers -Status post 3 units of PRBC, last transfusion of 1U of PRBC on 12/25/19 -LFTs improved and not checked the last few days -GI on board, continue supportive care and advance diet as tolerated -Continue PPI, Carafate -Patient was given Aranesp 40 mcg once on 2/23 -Continue to Monitor Blood Count and last few days her Hb/Hct has been dropping and went from 9.2/28.0 -> 8.5/26.0 -> 8.1/25.0; today her hemoglobin/hematocrit is 8.3/25.8 -Continue to monitor for signs and symptoms of bleeding -Repeat CBC in a.m.  AKI with oliguria/anuric/hyponatremia -In the setting of sepsis and severe acute pancreatitis -Status post CRRT, started on 2/11, stopped on 2/14 -Currently on HD (M/W/F) and likely will get dialyzed tomorrow per nephrology and her dialysis session today was held as renal wants to see her function tomorrow -Nephrology on board -S/P TDC on 12/27/19 by IR -Currently getting Dialyzed today -BUN/Cr worsened again from yesterday to today and it went from 18/3.86 is now 33/4.53 -Na+ was 128 and  now improved to 135 yesterday and today is 102 -Further Care Per Nephrology -Outpatient HD referral made by the nephrology social worker just in case this patient would require this but nephrology still wants to continue to monitor to see if her renal function improves the next couple days  and nephrology will continue to follow for renal recovery and nephrology wants to evaluate her renal function again tomorrow and likely will dialyze her if it is worse; Dr. Royce Macadamia is already anticipating the need for hemodialysis on 227 though increased output is encouraging -We will repeat her lab work in the a.m.  Leukocytosis, improved -Remains afebrile, hemodynamically stable -No obvious signs of infection; her WBC is now 9.8 -Continue to monitor off antibiotics -C/w CBC  New onset A. fib with RVR -Possibly likely related to sepsis -Currently rate controlled -Currently on oral Amiodarone 200 mg po Daily, may discontinue on discharge if continues to remain sinus rhythm but will continue for now  Thrombocytopenia -Resolved -Monitor closely for any signs of bleeding -Platelet Count is now 239,000 -Continue to Monitor and Repeat CBC in AM   Acute hypoxemic and hypercarbic respiratory failure -Chest x-ray showed atelectasis -She was on supplemental oxygen but now weaned off -SpO2: 95 % O2 Flow Rate (L/min): 0.5 L/min -Continue incentive spirometry -Repeat CXR in AM   Enterococcus UTI -S/p Completed antibiotics  Diabetes Mellitus Type 2 -C/w SSI, Lantus, Accu-Cheks, hypoglycemic protocol -CBG's ranging from 89-173  Obesity -Estimated body mass index is 35.01 kg/m as calculated from the following:   Height as of this encounter: 5' 0.98" (1.549 m).   Weight as of this encounter: 84 kg. -Weight Loss and Dietary Counseling given   Physical deconditioning -PT/OT recommending Home Health -Out of bed as tolerated  Hypomagnesemia -Patient magnesium levels was 1.5 and now 1.9  -Replete with IV mag sulfate 2 g yesterday -Continue monitor and replete as necessary -Repeat magnesium level in the a.m.  Diarrhea  -Hold her stool softeners -We will try Imodium and patient thinks this is helping but she started having some more diarrhea again today without the Imodium  -Continue monitor closely and I do not clinically suspect that this is infectious diarrhea she is afebrile and has no leukocytosis -If continues to worsen will need at least a GI pathogen panel  DVT prophylaxis: SCDs Code Status: FULL CODE  Family Communication: No family present at bedside  Disposition Plan: Anticipate discharging home with home health next few days now that she is being dialyzed.  She will need to have a seat for outpatient dialysis and renal wants to observe her for a few more days to ensure that her renal function does not recover prior to her getting set up with outpatient dialysis.  Consultants:   Gastroenterology  Nephrology   Procedures:    Antimicrobials:  Anti-infectives (From admission, onward)   Start     Dose/Rate Route Frequency Ordered Stop   12/27/19 0930  ceFAZolin (ANCEF) IVPB 2g/100 mL premix     2 g 200 mL/hr over 30 Minutes Intravenous  Once 12/27/19 0809 12/27/19 0910   12/27/19 0842  ceFAZolin (ANCEF) 2-4 GM/100ML-% IVPB    Note to Pharmacy: [place]   : cabinet override      12/27/19 0842 12/27/19 2044   12/15/19 2100  piperacillin-tazobactam (ZOSYN) IVPB 3.375 g  Status:  Discontinued     3.375 g 12.5 mL/hr over 240 Minutes Intravenous Every 6 hours 12/15/19 1717 12/15/19 1718   12/15/19 2100  piperacillin-tazobactam (ZOSYN)  IVPB 3.375 g  Status:  Discontinued     3.375 g 100 mL/hr over 30 Minutes Intravenous Every 6 hours 12/15/19 1718 12/17/19 0849   12/15/19 1500  piperacillin-tazobactam (ZOSYN) IVPB 2.25 g  Status:  Discontinued     2.25 g 100 mL/hr over 30 Minutes Intravenous Every 6 hours 12/15/19 1452 12/15/19 1717   12/13/19 2200  piperacillin-tazobactam (ZOSYN) IVPB 3.375 g  Status:  Discontinued     3.375 g 12.5 mL/hr over 240 Minutes Intravenous Every 8 hours 12/13/19 2124 12/15/19 1452     Subjective: Seen and examined at bedside and she states that her diarrhea is slightly coming back but states the Imodium has  helped.  No nausea or vomiting.  Thinks she is doing okay and she is urinating more.  Continues to have Foley catheter in place.  No chest pain, lightheadedness or dizziness.  No other concerns or complaints at this time.  Objective: Vitals:   12/29/19 1449 12/29/19 2046 12/30/19 0452 12/30/19 1402  BP: (!) 144/58 138/69 (!) 153/73 (!) 142/60  Pulse: (!) 101 85 89 94  Resp: 20 20 18 17   Temp: 98.2 F (36.8 C) 98 F (36.7 C) (!) 97.5 F (36.4 C) 98.4 F (36.9 C)  TempSrc: Oral Oral Oral Oral  SpO2: 92% 95% 95% 95%  Weight:      Height:        Intake/Output Summary (Last 24 hours) at 12/30/2019 1748 Last data filed at 12/30/2019 1403 Gross per 24 hour  Intake 827.45 ml  Output 950 ml  Net -122.55 ml   Filed Weights   12/26/19 1123 12/28/19 1513 12/28/19 1829  Weight: 81.4 kg 85.7 kg 84 kg   Examination: Physical Exam:  Constitutional: WN/WD Caucasian female currently no acute distress but she is sitting in the chair bedside still complaining that she is having some loose stools.   Eyes: Lids and conjunctivae normal, sclerae anicteric  ENMT: External Ears, Nose appear normal. Grossly normal hearing.  Neck: Appears normal, supple, no cervical masses, normal ROM, no appreciable thyromegaly Respiratory: Clear to auscultation bilaterally, no wheezing, rales, rhonchi or crackles. Normal respiratory effort and patient is not tachypenic. No accessory muscle use.  Unlabored breathing and she is not wearing any supplemental oxygen via nasal cannula Cardiovascular: RRR, no murmurs / rubs / gallops. S1 and S2 auscultated.  Has some trace lower extremity edema Abdomen: Soft, non-tender, distended secondary body habitus. No masses palpated. No appreciable hepatosplenomegaly. Bowel sounds positive x4.  GU: Deferred.  Foley catheter is in place Musculoskeletal: No clubbing / cyanosis of digits/nails. No joint deformity upper and lower extremities. Good ROM, no contractures. Normal strength and  muscle tone.  Skin: No rashes, lesions, ulcers on limited skin evaluation. No induration; Warm and dry.  Neurologic: CN 2-12 grossly intact with no focal deficits. Romberg sign and cerebellar reflexes not assessed.  Psychiatric: Normal judgment and insight. Alert and oriented x 3. Normal mood and appropriate affect.    Data Reviewed: I have personally reviewed following labs and imaging studies  CBC: Recent Labs  Lab 12/26/19 0417 12/27/19 0405 12/28/19 0420 12/29/19 0210 12/30/19 1418  WBC 11.2* 8.3 7.5 7.5 9.8  NEUTROABS 8.8* 6.1 5.6 5.8 8.1*  HGB 9.2* 8.5* 8.1* 8.3* 8.3*  HCT 28.0* 26.0* 25.0* 25.3* 25.8*  MCV 86.7 87.5 86.8 85.8 87.2  PLT 270 245 248 249 093   Basic Metabolic Panel: Recent Labs  Lab 12/26/19 0417 12/27/19 0405 12/28/19 0420 12/29/19 0210 12/30/19 1418  NA  123* 130* 128* 135 133*  K 3.9 3.6 3.4* 3.7 3.7  CL 86* 92* 92* 98 97*  CO2 20* 23 22 27 24   GLUCOSE 120* 130* 137* 154* 175*  BUN 63* 34* 40* 18 33*  CREATININE 7.43* 5.29* 6.02* 3.86* 4.53*  CALCIUM 7.9* 8.0* 8.1* 8.1* 8.3*  MG  --   --   --  1.5* 1.9  PHOS 8.4* 5.9* 6.0* 3.6 4.5   GFR: Estimated Creatinine Clearance: 10.6 mL/min (A) (by C-G formula based on SCr of 4.53 mg/dL (H)). Liver Function Tests: Recent Labs  Lab 12/26/19 0417 12/27/19 0405 12/28/19 0420 12/29/19 0210 12/30/19 1418  AST  --   --   --  15 15  ALT  --   --   --  9 6  ALKPHOS  --   --   --  124 114  BILITOT  --   --   --  0.9 0.6  PROT  --   --   --  5.2* 5.2*  ALBUMIN 2.1* 2.0* 2.0* 2.2* 2.3*   No results for input(s): LIPASE, AMYLASE in the last 168 hours. No results for input(s): AMMONIA in the last 168 hours. Coagulation Profile: No results for input(s): INR, PROTIME in the last 168 hours. Cardiac Enzymes: No results for input(s): CKTOTAL, CKMB, CKMBINDEX, TROPONINI in the last 168 hours. BNP (last 3 results) No results for input(s): PROBNP in the last 8760 hours. HbA1C: No results for input(s):  HGBA1C in the last 72 hours. CBG: Recent Labs  Lab 12/29/19 1724 12/29/19 2044 12/30/19 0738 12/30/19 1141 12/30/19 1639  GLUCAP 218* 170* 132* 155* 147*   Lipid Profile: No results for input(s): CHOL, HDL, LDLCALC, TRIG, CHOLHDL, LDLDIRECT in the last 72 hours. Thyroid Function Tests: No results for input(s): TSH, T4TOTAL, FREET4, T3FREE, THYROIDAB in the last 72 hours. Anemia Panel: No results for input(s): VITAMINB12, FOLATE, FERRITIN, TIBC, IRON, RETICCTPCT in the last 72 hours. Sepsis Labs: No results for input(s): PROCALCITON, LATICACIDVEN in the last 168 hours.  No results found for this or any previous visit (from the past 240 hour(s)).   RN Pressure Injury Documentation:     Estimated body mass index is 35.01 kg/m as calculated from the following:   Height as of this encounter: 5' 0.98" (1.549 m).   Weight as of this encounter: 84 kg.  Malnutrition Type:  Nutrition Problem: Inadequate oral intake Etiology: altered GI function, acute illness   Malnutrition Characteristics:  Signs/Symptoms: NPO status   Nutrition Interventions:  Interventions: Nepro shake, MVI, Magic cup   Radiology Studies: No results found. Scheduled Meds: . amiodarone  200 mg Oral Daily  . Chlorhexidine Gluconate Cloth  6 each Topical Daily  . Chlorhexidine Gluconate Cloth  6 each Topical Q0600  . feeding supplement (NEPRO CARB STEADY)  237 mL Oral TID BM  . fluticasone  1 spray Each Nare Daily  . insulin aspart  0-5 Units Subcutaneous QHS  . insulin aspart  0-9 Units Subcutaneous TID WC  . insulin glargine  14 Units Subcutaneous Daily  . multivitamin  1 tablet Oral QHS  . pantoprazole  40 mg Oral BID AC  . sodium chloride flush  10-40 mL Intracatheter Q12H  . sucralfate  1 g Oral Q6H   Continuous Infusions: . sodium chloride Stopped (12/28/19 2146)    LOS: 17 days   Kerney Elbe, DO [facility] PAGER is on [place]  If 7PM-7AM, please contact night-coverage  [email or web address]

## 2019-12-31 LAB — RENAL FUNCTION PANEL
Albumin: 2.3 g/dL — ABNORMAL LOW (ref 3.5–5.0)
Anion gap: 13 (ref 5–15)
BUN: 37 mg/dL — ABNORMAL HIGH (ref 8–23)
CO2: 23 mmol/L (ref 22–32)
Calcium: 8.5 mg/dL — ABNORMAL LOW (ref 8.9–10.3)
Chloride: 96 mmol/L — ABNORMAL LOW (ref 98–111)
Creatinine, Ser: 4.68 mg/dL — ABNORMAL HIGH (ref 0.44–1.00)
GFR calc Af Amer: 10 mL/min — ABNORMAL LOW (ref >60)
GFR calc non Af Amer: 9 mL/min — ABNORMAL LOW (ref >60)
Glucose, Bld: 143 mg/dL — ABNORMAL HIGH (ref 70–99)
Phosphorus: 5.2 mg/dL — ABNORMAL HIGH (ref 2.5–4.6)
Potassium: 3.4 mmol/L — ABNORMAL LOW (ref 3.5–5.1)
Sodium: 132 mmol/L — ABNORMAL LOW (ref 135–145)

## 2019-12-31 LAB — CBC WITH DIFFERENTIAL/PLATELET
Abs Immature Granulocytes: 0.11 10*3/uL — ABNORMAL HIGH (ref 0.00–0.07)
Basophils Absolute: 0.1 10*3/uL (ref 0.0–0.1)
Basophils Relative: 1 %
Eosinophils Absolute: 0 10*3/uL (ref 0.0–0.5)
Eosinophils Relative: 0 %
HCT: 26.1 % — ABNORMAL LOW (ref 36.0–46.0)
Hemoglobin: 8.3 g/dL — ABNORMAL LOW (ref 12.0–15.0)
Immature Granulocytes: 1 %
Lymphocytes Relative: 10 %
Lymphs Abs: 0.8 10*3/uL (ref 0.7–4.0)
MCH: 28.3 pg (ref 26.0–34.0)
MCHC: 31.8 g/dL (ref 30.0–36.0)
MCV: 89.1 fL (ref 80.0–100.0)
Monocytes Absolute: 0.7 10*3/uL (ref 0.1–1.0)
Monocytes Relative: 9 %
Neutro Abs: 6.4 10*3/uL (ref 1.7–7.7)
Neutrophils Relative %: 79 %
Platelets: 228 10*3/uL (ref 150–400)
RBC: 2.93 MIL/uL — ABNORMAL LOW (ref 3.87–5.11)
RDW: 15.2 % (ref 11.5–15.5)
WBC: 8 10*3/uL (ref 4.0–10.5)
nRBC: 0 % (ref 0.0–0.2)

## 2019-12-31 LAB — GLUCOSE, CAPILLARY
Glucose-Capillary: 175 mg/dL — ABNORMAL HIGH (ref 70–99)
Glucose-Capillary: 225 mg/dL — ABNORMAL HIGH (ref 70–99)
Glucose-Capillary: 154 mg/dL — ABNORMAL HIGH (ref 70–99)
Glucose-Capillary: 174 mg/dL — ABNORMAL HIGH (ref 70–99)
Glucose-Capillary: 152 mg/dL — ABNORMAL HIGH (ref 70–99)

## 2019-12-31 LAB — MAGNESIUM: Magnesium: 1.7 mg/dL (ref 1.7–2.4)

## 2019-12-31 NOTE — Plan of Care (Signed)

## 2019-12-31 NOTE — Progress Notes (Signed)
Spoke with Elaina with phlebotomy [phone]).  She will be coming to get am labs since it was not done yet.

## 2019-12-31 NOTE — Progress Notes (Addendum)
Nephrology Progress Note:   Patient ID: Katherine Oliver, female   DOB: 1945/12/07, 74 y.o.   MRN: [id number]    S:  Labs ordered but again do not appear to have been drawn again today.  Had 1.5 L UOP over 2/26.  Last had HD on 2/24 with 1.5 liters UF.      Review of systems     Denies shortness of breath Continues with nausea  Intermittently not vomiting - better Urine output went up Loose stools - on imodium    O:BP (!) 150/71 (BP Location: Right Arm)   Pulse 89   Temp 98.5 F (36.9 C) (Oral)   Resp 17   Ht 5' 0.98" (1.549 m)   Wt 84 kg   SpO2 93%   BMI 35.01 kg/m   Intake/Output Summary (Last 24 hours) at 12/31/2019 0704 Last data filed at 12/31/2019 0630 Gross per 24 hour  Intake 1320 ml  Output 1500 ml  Net -180 ml   Intake/Output: I/O last 3 completed shifts: In: 1320 [P.O.:1320] Out: 2050 [Urine:2050]  Intake/Output this shift:  No intake/output data recorded. Weight change:    Physical exam: Gen: adult female NAD CVS: S1S2 no rub Resp: cta unlabored and on room air  Abd: soft, nontender; obese habitus Ext: no pitting edema Neuro alert and oriented x3 ; provides hx and follows commands  Access left chest tunneled catheter    Recent Labs  Lab 12/25/19 0349 12/26/19 0417 12/27/19 0405 12/28/19 0420 12/29/19 0210 12/30/19 1418  NA 128* 123* 130* 128* 135 133*  K 3.8 3.9 3.6 3.4* 3.7 3.7  CL 89* 86* 92* 92* 98 97*  CO2 24 20* 23 22 27 24   GLUCOSE 150* 120* 130* 137* 154* 175*  BUN 46* 63* 34* 40* 18 33*  CREATININE 6.17* 7.43* 5.29* 6.02* 3.86* 4.53*  ALBUMIN 2.0* 2.1* 2.0* 2.0* 2.2* 2.3*  CALCIUM 7.9* 7.9* 8.0* 8.1* 8.1* 8.3*  PHOS 7.2* 8.4* 5.9* 6.0* 3.6 4.5  AST  --   --   --   --  15 15  ALT  --   --   --   --  9 6   Liver Function Tests: Recent Labs  Lab 12/28/19 0420 12/29/19 0210 12/30/19 1418  AST  --  15 15  ALT  --  9 6  ALKPHOS  --  124 114  BILITOT  --  0.9 0.6  PROT  --  5.2* 5.2*  ALBUMIN 2.0* 2.2* 2.3*    CBC: Recent Labs  Lab 12/26/19 0417 12/26/19 0417 12/27/19 0405 12/27/19 0405 12/28/19 0420 12/29/19 0210 12/30/19 1418  WBC 11.2*   < > 8.3   < > 7.5 7.5 9.8  NEUTROABS 8.8*   < > 6.1   < > 5.6 5.8 8.1*  HGB 9.2*   < > 8.5*   < > 8.1* 8.3* 8.3*  HCT 28.0*   < > 26.0*   < > 25.0* 25.3* 25.8*  MCV 86.7  --  87.5  --  86.8 85.8 87.2  PLT 270   < > 245   < > 248 249 239   < > = values in this interval not displayed.   Cardiac Enzymes: No results for input(s): CKTOTAL, CKMB, CKMBINDEX, TROPONINI in the last 168 hours. CBG: Recent Labs  Lab 12/29/19 2044 12/30/19 0738 12/30/19 1141 12/30/19 1639 12/30/19 2203  GLUCAP 170* 132* 155* 147* 152*    Studies/Results: No results found. Marland Kitchen amiodarone  200 mg Oral  Daily  . Chlorhexidine Gluconate Cloth  6 each Topical Daily  . Chlorhexidine Gluconate Cloth  6 each Topical Q0600  . feeding supplement (NEPRO CARB STEADY)  237 mL Oral TID BM  . fluticasone  1 spray Each Nare Daily  . insulin aspart  0-5 Units Subcutaneous QHS  . insulin aspart  0-9 Units Subcutaneous TID WC  . insulin glargine  14 Units Subcutaneous Daily  . multivitamin  1 tablet Oral QHS  . pantoprazole  40 mg Oral BID AC  . sodium chloride flush  10-40 mL Intracatheter Q12H  . sucralfate  1 g Oral Q6H    BMET    Component Value Date/Time   NA 133 (L) 12/30/2019 1418   K 3.7 12/30/2019 1418   CL 97 (L) 12/30/2019 1418   CO2 24 12/30/2019 1418   GLUCOSE 175 (H) 12/30/2019 1418   BUN 33 (H) 12/30/2019 1418   CREATININE 4.53 (H) 12/30/2019 1418   CREATININE 1.03 10/20/2013 1751   CALCIUM 8.3 (L) 12/30/2019 1418   GFRNONAA 9 (L) 12/30/2019 1418   GFRAA 10 (L) 12/30/2019 1418   CBC    Component Value Date/Time   WBC 9.8 12/30/2019 1418   RBC 2.96 (L) 12/30/2019 1418   HGB 8.3 (L) 12/30/2019 1418   HCT 25.8 (L) 12/30/2019 1418   PLT 239 12/30/2019 1418   MCV 87.2 12/30/2019 1418   MCH 28.0 12/30/2019 1418   MCHC 32.2 12/30/2019 1418   RDW 15.4  12/30/2019 1418   LYMPHSABS 0.8 12/30/2019 1418   MONOABS 0.7 12/30/2019 1418   EOSABS 0.0 12/30/2019 1418   BASOSABS 0.1 12/30/2019 1418    Assessment/Plan:  1. AKI- in setting of sepsis and severe acute pancreatitis +/- IV contrast exposure. Baseline Cr 1 -1.2. CRRT started on 12/15/19 - 2/14 then transitioned to [facility] today.  Will follow-up her labs today - they aren't drawn yet - RN is calling labs - Anticipate may need for HD on 2/27 likely though increased output is encouraging - discontinue foley and monitor for retention per nursing protocol - re-ordered her labs for 2/28 in the event current orders are somehow routed to wrong place; left other orders for now in case connected with the now pending labs - Will continue to follow inpatient for renal recovery though she has an AKI spot at [facility] at 11:20 am with 10:40 arrival for first treatment time if one is needed.   2. Acute gallstone pacreatitis- s/p ERCP with biliary stent placement.  3. Anemia of critical illness and GIB- stable. Continue to follow and transfuse prn.  aranesp 40 mcg once on 2/23.  4. Atrial fibrillation  - with hx RVR.  Per primary team  5. Leukocytosis- resolved.  6. Hyponatremia - resolved with HD; corrects with glucose 7. HTN - acceptable for now  Disposition: Actively monitoring for recovery and needs to remain inpatient   Claudia Desanctis, MD 12/31/2019 7:18 AM  Reviewed labs. No dialysis today.  11:47 AM 12/31/2019 Claudia Desanctis

## 2019-12-31 NOTE — Progress Notes (Signed)
PROGRESS NOTE    Katherine Oliver  KZL:[id number] DOB: [birth year] DOA: 12/13/2019 PCP: [name], MD   Brief Narrative:  HPI from PCCM Patient is a 74 year old obese Caucasian female with a past medical significant for but not limited to gallstone pancreatitis and UTI.  She had an ERCP on 210 with stent placement and PCCM was consulted to admit worsening pancreatic pain, increased tachycardia and increased work of breathing supplemental oxygen use.  She had a worsening AKI.  General surgery was consulted due to increased intrabladder pressure and concern for possible abdominal compartment syndrome.  On 12/16/2019 when she went into A. fib with RVR and started on diltiazem and later became hypotensive and switched to amiodarone.  Patient also had an EGD done on 12/19/2019 due to possible GI bleed and EGD showed stress ulcerations.  Due to some improvement in her overall clinical status tried hospitalist assumed care on 12/21/2019.  Because her renal function has not improved significantly for her CKD she is started on hemodialysis and had a tunneled catheter placed.  Nephrology has now placed in order to remove her nontunneled catheter and they want to continue to follow for renal recovery though they have contacted the social worker for hemodialysis regarding outpatient unit should one be needed and this is in the process of being set up.  Nephrology still wants to continue to monitor closely as her labs did not worsen that much compared to yesterday.  Dr. Royce Macadamia is now removed her Foley catheter and nephrology is actively monitoring for renal recovery and because of this she needs to remain inpatient.  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, without long-term current use of insulin (HCC)   HTN (hypertension)   Dyslipidemia associated with type 2 diabetes mellitus (HCC)   NAFLD (nonalcoholic fatty liver disease)   GERD  (gastroesophageal reflux disease)   Polycythemia   Obesity, Class I, BMI 30-34.9   CKD stage 3 due to type 2 diabetes mellitus (HCC)   Acute gallstone pancreatitis   Sepsis ([place])   Acute renal failure (ARF) ([place])   Acute respiratory failure (HCC)   Choledocholithiasis   Gallstone pancreatitis post ERCP with biliary stent placement Acute Hepatitis -LFTs improved and not checked the last few days but today her AST is 15 and her ALT is 6 -GI on board, continue supportive care and advance diet as tolerated -Continue PPI, Carafate -On discharge she will need to follow-up with GI to address her stent and repeat ERCP in the coming weeks  Acute blood loss anemia/GI bleeding 2/2 gastric/stress ulcers -Status post 3 units of PRBC, last transfusion of 1U of PRBC on 12/25/19 -LFTs improved and not checked the last few days -GI on board, continue supportive care and advance diet as tolerated -Continue PPI, Carafate -Patient was given Aranesp 40 mcg once on 2/23 -Continue to Monitor Blood Count; has been relatively stable last few days and today it was 8.3/26.1 -Continue to monitor for signs and symptoms of bleeding -Repeat CBC in a.m.  AKI with oliguria/anuric/hyponatremia -In the setting of sepsis and severe acute pancreatitis -Status post CRRT, started on 2/11, stopped on 2/14 -Currently on HD (M/W/F) and likely will get dialyzed tomorrow per nephrology and her dialysis session today was held as renal wants to see her function tomorrow -Nephrology on board -S/P TDC on 12/27/19 by IR -Currently getting Dialyzed today -BUN/Cr worsened again from yesterday to today and it went from 18/3.86 ->  33/4.53 -> 37/4.68 -Na+ was 128 and had improved to 135 but today is 132 -Further Care Per Nephrology -Outpatient HD referral made by the nephrology social worker just in case this patient would require this but nephrology still wants to continue to monitor to see if her renal function improves the next  couple days and nephrology will continue to follow for renal recovery and nephrology wants to evaluate her renal function again tomorrow  -We will repeat her lab work in the a.m.  Leukocytosis, improved -Remains afebrile, hemodynamically stable -No obvious signs of infection; her WBC is now 8.0 -Continue to monitor off antibiotics -C/w CBC  New onset A. fib with RVR -Possibly likely related to sepsis -Currently rate controlled -Currently on oral Amiodarone 200 mg po Daily, may discontinue on discharge if continues to remain sinus rhythm but will continue for now  Thrombocytopenia -Resolved -Monitor closely for any signs of bleeding -Platelet Count is now 228,000 -Continue to Monitor and Repeat CBC in AM   Acute hypoxemic and hypercarbic respiratory failure -Chest x-ray showed atelectasis -She was on supplemental oxygen but now weaned off -SpO2: 92 % O2 Flow Rate (L/min): 0.5 L/min -Continue incentive spirometry -Repeat CXR in AM   Enterococcus UTI -S/p Completed antibiotics  Diabetes Mellitus Type 2 -C/w SSI, Lantus, Accu-Cheks, hypoglycemic protocol -CBG's ranging from 147-225  Obesity -Estimated body mass index is 35.01 kg/m as calculated from the following:   Height as of this encounter: 5' 0.98" (1.549 m).   Weight as of this encounter: 84 kg. -Weight Loss and Dietary Counseling given   Physical deconditioning -PT/OT recommending Home Health -Out of bed as tolerated  Hypomagnesemia -Patient magnesium level is now 1.7 -Continue monitor and replete as necessary -Repeat magnesium level in the a.m.  Diarrhea, improved -Hold her stool softeners -Improved with Imodium -Continue monitor closely and I do not clinically suspect that this is infectious diarrhea she is afebrile and has no leukocytosis -If continues to worsen will need at least a GI pathogen panel  DVT prophylaxis: SCDs Code Status: FULL CODE  Family Communication: No family present at  bedside  Disposition Plan: Anticipate discharging home with home health next few days now that she is being dialyzed.  She will need to have a seat for outpatient dialysis and renal wants to observe her for a few more days to ensure that her renal function does not recover prior to her getting set up with outpatient dialysis.  Consultants:   Gastroenterology  Nephrology   Procedures: CRRT and hemodialysis   Antimicrobials:  Anti-infectives (From admission, onward)   Start     Dose/Rate Route Frequency Ordered Stop   12/27/19 0930  ceFAZolin (ANCEF) IVPB 2g/100 mL premix     2 g 200 mL/hr over 30 Minutes Intravenous  Once 12/27/19 0809 12/27/19 0910   12/27/19 0842  ceFAZolin (ANCEF) 2-4 GM/100ML-% IVPB    Note to Pharmacy: [place]   : cabinet override      12/27/19 0842 12/27/19 2044   12/15/19 2100  piperacillin-tazobactam (ZOSYN) IVPB 3.375 g  Status:  Discontinued     3.375 g 12.5 mL/hr over 240 Minutes Intravenous Every 6 hours 12/15/19 1717 12/15/19 1718   12/15/19 2100  piperacillin-tazobactam (ZOSYN) IVPB 3.375 g  Status:  Discontinued     3.375 g 100 mL/hr over 30 Minutes Intravenous Every 6 hours 12/15/19 1718 12/17/19 0849   12/15/19 1500  piperacillin-tazobactam (ZOSYN) IVPB 2.25 g  Status:  Discontinued     2.25 g  100 mL/hr over 30 Minutes Intravenous Every 6 hours 12/15/19 1452 12/15/19 1717   12/13/19 2200  piperacillin-tazobactam (ZOSYN) IVPB 3.375 g  Status:  Discontinued     3.375 g 12.5 mL/hr over 240 Minutes Intravenous Every 8 hours 12/13/19 2124 12/15/19 1452     Subjective: Seen and examined at bedside and she is doing okay today.  Did not get very much sleep last night.  No nausea or vomiting but thinks her diarrhea is improved with Imodium.  Foley catheter has been removed and she is happy about this.  No other concerns or complaints at this time.  Objective: Vitals:   12/30/19 2203 12/31/19 0529 12/31/19 1242 12/31/19 1349  BP: (!) 155/71 (!)  150/71 (!) 147/74   Pulse: 87 89 84   Resp: 17 17 18    Temp: 98.4 F (36.9 C) 98.5 F (36.9 C) 97.8 F (36.6 C)   TempSrc: Oral Oral Oral   SpO2: 95% 93% 93% 92%  Weight:      Height:        Intake/Output Summary (Last 24 hours) at 12/31/2019 1516 Last data filed at 12/31/2019 1242 Gross per 24 hour  Intake 840 ml  Output 1600 ml  Net -760 ml   Filed Weights   12/26/19 1123 12/28/19 1513 12/28/19 1829  Weight: 81.4 kg 85.7 kg 84 kg   Examination: Physical Exam:  Constitutional: WN/WD Caucasian female currently no acute distress and she feels okay.   Eyes: Lids and conjunctivae normal, sclerae anicteric  ENMT: External Ears, Nose appear normal. Grossly normal hearing.  Neck: Appears normal, supple, no cervical masses, normal ROM, no appreciable thyromegaly neck: No JVD Respiratory: Diminished to auscultation bilaterally, no wheezing, rales, rhonchi or crackles. Normal respiratory effort and patient is not tachypenic. No accessory muscle use.  Unlabored breathing and is not wearing any supplemental oxygen via nasal cannula Cardiovascular: Irregularly irregular, no murmurs / rubs / gallops. S1 and S2 auscultated.  Trace extremity edema.  Abdomen: Soft, non-tender, distended secondary body habitus. Bowel sounds positive.  GU: Deferred. Musculoskeletal: No clubbing / cyanosis of digits/nails. No joint deformity upper and lower extremities.  Skin: No rashes, lesions, ulcers on limited skin evaluation. No induration; Warm and dry.  Neurologic: CN 2-12 grossly intact with no focal deficits.  Romberg sign and cerebellar reflexes not assessed.  Psychiatric: Normal judgment and insight. Alert and oriented x 3. Normal mood and appropriate affect.   Data Reviewed: I have personally reviewed following labs and imaging studies  CBC: Recent Labs  Lab 12/27/19 0405 12/28/19 0420 12/29/19 0210 12/30/19 1418 12/31/19 0716  WBC 8.3 7.5 7.5 9.8 8.0  NEUTROABS 6.1 5.6 5.8 8.1* 6.4  HGB  8.5* 8.1* 8.3* 8.3* 8.3*  HCT 26.0* 25.0* 25.3* 25.8* 26.1*  MCV 87.5 86.8 85.8 87.2 89.1  PLT 245 248 249 239 384   Basic Metabolic Panel: Recent Labs  Lab 12/27/19 0405 12/28/19 0420 12/29/19 0210 12/30/19 1418 12/31/19 0716  NA 130* 128* 135 133* 132*  K 3.6 3.4* 3.7 3.7 3.4*  CL 92* 92* 98 97* 96*  CO2 23 22 27 24 23   GLUCOSE 130* 137* 154* 175* 143*  BUN 34* 40* 18 33* 37*  CREATININE 5.29* 6.02* 3.86* 4.53* 4.68*  CALCIUM 8.0* 8.1* 8.1* 8.3* 8.5*  MG  --   --  1.5* 1.9 1.7  PHOS 5.9* 6.0* 3.6 4.5 5.2*   GFR: Estimated Creatinine Clearance: 10.2 mL/min (A) (by C-G formula based on SCr of 4.68 mg/dL (H)). Liver  Function Tests: Recent Labs  Lab 12/27/19 0405 12/28/19 0420 12/29/19 0210 12/30/19 1418 12/31/19 0716  AST  --   --  15 15  --   ALT  --   --  9 6  --   ALKPHOS  --   --  124 114  --   BILITOT  --   --  0.9 0.6  --   PROT  --   --  5.2* 5.2*  --   ALBUMIN 2.0* 2.0* 2.2* 2.3* 2.3*   No results for input(s): LIPASE, AMYLASE in the last 168 hours. No results for input(s): AMMONIA in the last 168 hours. Coagulation Profile: No results for input(s): INR, PROTIME in the last 168 hours. Cardiac Enzymes: No results for input(s): CKTOTAL, CKMB, CKMBINDEX, TROPONINI in the last 168 hours. BNP (last 3 results) No results for input(s): PROBNP in the last 8760 hours. HbA1C: No results for input(s): HGBA1C in the last 72 hours. CBG: Recent Labs  Lab 12/30/19 1639 12/30/19 2203 12/31/19 0755 12/31/19 0916 12/31/19 1155  GLUCAP 147* 152* 175* 225* 154*   Lipid Profile: No results for input(s): CHOL, HDL, LDLCALC, TRIG, CHOLHDL, LDLDIRECT in the last 72 hours. Thyroid Function Tests: No results for input(s): TSH, T4TOTAL, FREET4, T3FREE, THYROIDAB in the last 72 hours. Anemia Panel: No results for input(s): VITAMINB12, FOLATE, FERRITIN, TIBC, IRON, RETICCTPCT in the last 72 hours. Sepsis Labs: No results for input(s): PROCALCITON, LATICACIDVEN in the  last 168 hours.  No results found for this or any previous visit (from the past 240 hour(s)).   RN Pressure Injury Documentation:     Estimated body mass index is 35.01 kg/m as calculated from the following:   Height as of this encounter: 5' 0.98" (1.549 m).   Weight as of this encounter: 84 kg.  Malnutrition Type:  Nutrition Problem: Inadequate oral intake Etiology: altered GI function, acute illness   Malnutrition Characteristics:  Signs/Symptoms: NPO status   Nutrition Interventions:  Interventions: Nepro shake, MVI, Magic cup   Radiology Studies: No results found. Scheduled Meds: . amiodarone  200 mg Oral Daily  . Chlorhexidine Gluconate Cloth  6 each Topical Daily  . Chlorhexidine Gluconate Cloth  6 each Topical Q0600  . feeding supplement (NEPRO CARB STEADY)  237 mL Oral TID BM  . fluticasone  1 spray Each Nare Daily  . insulin aspart  0-5 Units Subcutaneous QHS  . insulin aspart  0-9 Units Subcutaneous TID WC  . insulin glargine  14 Units Subcutaneous Daily  . multivitamin  1 tablet Oral QHS  . pantoprazole  40 mg Oral BID AC  . sodium chloride flush  10-40 mL Intracatheter Q12H  . sucralfate  1 g Oral Q6H   Continuous Infusions: . sodium chloride Stopped (12/28/19 2146)    LOS: 18 days   Kerney Elbe, DO [facility] PAGER is on [place]  If 7PM-7AM, please contact night-coverage [email or web address]

## 2020-01-01 LAB — RENAL FUNCTION PANEL
Albumin: 2.2 g/dL — ABNORMAL LOW (ref 3.5–5.0)
Anion gap: 12 (ref 5–15)
BUN: 41 mg/dL — ABNORMAL HIGH (ref 8–23)
CO2: 23 mmol/L (ref 22–32)
Calcium: 8.5 mg/dL — ABNORMAL LOW (ref 8.9–10.3)
Chloride: 97 mmol/L — ABNORMAL LOW (ref 98–111)
Creatinine, Ser: 4.41 mg/dL — ABNORMAL HIGH (ref 0.44–1.00)
GFR calc Af Amer: 11 mL/min — ABNORMAL LOW (ref >60)
GFR calc non Af Amer: 9 mL/min — ABNORMAL LOW (ref >60)
Glucose, Bld: 137 mg/dL — ABNORMAL HIGH (ref 70–99)
Phosphorus: 4.6 mg/dL (ref 2.5–4.6)
Potassium: 3.2 mmol/L — ABNORMAL LOW (ref 3.5–5.1)
Sodium: 132 mmol/L — ABNORMAL LOW (ref 135–145)

## 2020-01-01 LAB — HEMOGLOBIN AND HEMATOCRIT, BLOOD
HCT: 28.7 % — ABNORMAL LOW (ref 36.0–46.0)
Hemoglobin: 9.5 g/dL — ABNORMAL LOW (ref 12.0–15.0)

## 2020-01-01 LAB — CBC WITH DIFFERENTIAL/PLATELET
Abs Immature Granulocytes: 0.08 10*3/uL — ABNORMAL HIGH (ref 0.00–0.07)
Basophils Absolute: 0 10*3/uL (ref 0.0–0.1)
Basophils Relative: 0 %
Eosinophils Absolute: 0 10*3/uL (ref 0.0–0.5)
Eosinophils Relative: 0 %
HCT: 23.8 % — ABNORMAL LOW (ref 36.0–46.0)
Hemoglobin: 7.7 g/dL — ABNORMAL LOW (ref 12.0–15.0)
Immature Granulocytes: 1 %
Lymphocytes Relative: 11 %
Lymphs Abs: 0.8 10*3/uL (ref 0.7–4.0)
MCH: 28 pg (ref 26.0–34.0)
MCHC: 32.4 g/dL (ref 30.0–36.0)
MCV: 86.5 fL (ref 80.0–100.0)
Monocytes Absolute: 0.6 10*3/uL (ref 0.1–1.0)
Monocytes Relative: 8 %
Neutro Abs: 5.6 10*3/uL (ref 1.7–7.7)
Neutrophils Relative %: 80 %
Platelets: 213 10*3/uL (ref 150–400)
RBC: 2.75 MIL/uL — ABNORMAL LOW (ref 3.87–5.11)
RDW: 15.3 % (ref 11.5–15.5)
WBC: 7.1 10*3/uL (ref 4.0–10.5)
nRBC: 0 % (ref 0.0–0.2)

## 2020-01-01 LAB — GLUCOSE, CAPILLARY
Glucose-Capillary: 151 mg/dL — ABNORMAL HIGH (ref 70–99)
Glucose-Capillary: 136 mg/dL — ABNORMAL HIGH (ref 70–99)
Glucose-Capillary: 178 mg/dL — ABNORMAL HIGH (ref 70–99)
Glucose-Capillary: 163 mg/dL — ABNORMAL HIGH (ref 70–99)

## 2020-01-01 LAB — MAGNESIUM: Magnesium: 1.6 mg/dL — ABNORMAL LOW (ref 1.7–2.4)

## 2020-01-01 LAB — PREPARE RBC (CROSSMATCH)

## 2020-01-01 MED ORDER — POTASSIUM CHLORIDE CRYS ER 20 MEQ PO TBCR
20.0000 meq | EXTENDED_RELEASE_TABLET | Freq: Once | ORAL | Status: AC
  Administered 2020-01-01: 20 meq via ORAL
  Filled 2020-01-01: qty 1

## 2020-01-01 MED ORDER — AMLODIPINE BESYLATE 2.5 MG PO TABS
2.5000 mg | ORAL_TABLET | Freq: Every day | ORAL | Status: DC
  Administered 2020-01-01 – 2020-01-03 (×3): 2.5 mg via ORAL
  Filled 2020-01-01 (×3): qty 1

## 2020-01-01 MED ORDER — MAGNESIUM SULFATE 2 GM/50ML IV SOLN
2.0000 g | Freq: Once | INTRAVENOUS | Status: AC
  Administered 2020-01-01: 2 g via INTRAVENOUS
  Filled 2020-01-01: qty 50

## 2020-01-01 MED ORDER — DARBEPOETIN ALFA 60 MCG/0.3ML IJ SOSY
60.0000 ug | PREFILLED_SYRINGE | Freq: Once | INTRAMUSCULAR | Status: AC
  Administered 2020-01-03: 60 ug via SUBCUTANEOUS
  Filled 2020-01-01: qty 0.3

## 2020-01-01 MED ORDER — SODIUM CHLORIDE 0.9% IV SOLUTION: Freq: Once | INTRAVENOUS | Status: AC

## 2020-01-01 NOTE — Progress Notes (Addendum)
PROGRESS NOTE    Katherine Oliver  VOZ:[id number] DOB: 01/07/1946 DOA: 12/13/2019 PCP: [name], MD   Brief Narrative:  HPI from PCCM Patient is a 74 year old obese Caucasian female with a past medical significant for but not limited to gallstone pancreatitis and UTI.  She had an ERCP on 2/10 with stent placement and PCCM was consulted to admit worsening pancreatic pain, increased tachycardia and increased work of breathing supplemental oxygen use.  She had a worsening AKI.  General surgery was consulted due to increased intrabladder pressure and concern for possible abdominal compartment syndrome.  On 12/16/2019 when she went into A. fib with RVR and started on diltiazem and later became hypotensive and switched to amiodarone.  Patient also had an EGD done on 12/19/2019 due to possible GI bleed and EGD showed stress ulcerations.  Due to some improvement in her overall clinical status tried hospitalist assumed care on 12/21/2019.  Because her renal function has not improved significantly for her CKD she is started on hemodialysis and had a tunneled catheter placed.  Nephrology has now placed in order to remove her nontunneled catheter and they want to continue to follow for renal recovery though they have contacted the social worker for hemodialysis regarding outpatient unit should one be needed and this is in the process of being set up.  Nephrology still wants to continue to monitor closely as her labs did not worsen that much compared to yesterday.  Dr. Royce Macadamia has now removed her Foley catheter and nephrology is actively monitoring for renal recovery and because of this she needs to remain inpatient.  Today her renal function looks slightly better than the day before.  Nephrology is transfusing 1 unit PRBCs to optimize anemia for renal recovery and recommends holding hemodialysis again today and nephrology feels that she will not appear to need outpatient hemodialysis at this  time.  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, without long-term current use of insulin (HCC)   HTN (hypertension)   Dyslipidemia associated with type 2 diabetes mellitus (HCC)   NAFLD (nonalcoholic fatty liver disease)   GERD (gastroesophageal reflux disease)   Polycythemia   Obesity, Class I, BMI 30-34.9   CKD stage 3 due to type 2 diabetes mellitus (HCC)   Acute gallstone pancreatitis   Sepsis ([place])   Acute renal failure (ARF) ([place])   Acute respiratory failure (HCC)   Choledocholithiasis   Gallstone pancreatitis post ERCP with biliary stent placement, improved Acute Hepatitis, improved  -LFTs improved and not checked the last few days but on last check her AST is 15 and her ALT is 6 -GI on board, continue supportive care and advance diet as tolerated -Continue PPI, Carafate -On discharge she will need to follow-up with GI to address her stent and repeat ERCP in the coming weeks  Acute blood loss anemia/GI bleeding 2/2 gastric/stress ulcers -Status post 3 units of PRBC, last transfusion of 1U of PRBC on 12/25/19 and will be transfused another 1 unit of PRBCs today per nephrology -GI on board, continue supportive care and advance diet as tolerated -Continue PPI, Carafate -Patient was given Aranesp 40 mcg once on 2/23 -Continue to Monitor Blood Count; has been relatively stable but did drop slightly and went from 8.3/26.1 is now 7.7/20.8 -Continue to monitor for signs and symptoms of bleeding -Repeat CBC in a.m.  AKI with oliguria/anuric/hyponatremia -In the setting of sepsis and severe acute pancreatitis -Status post CRRT, started on  2/11, stopped on 2/14 -Currently on HD (M/W/F) and likely will get dialyzed tomorrow per nephrology and her dialysis session today was held as renal wants to see her function tomorrow -Nephrology on board -S/P TDC on 12/27/19 by IR -BUN/Cr went from 18/3.86 -> 33/4.53 -> 37/4.68 ->  41/4.41 and is slightly improved today  -Na+ was 128 and had improved to 135 but today is 132 -Further Care Per Nephrology -Outpatient HD referral made by the nephrology social worker just in case this patient would require this but nephrology still wants to continue to monitor to see if her renal function improves the next couple days and nephrology will continue to follow for renal recovery and nephrology wants to evaluate her renal function again tomorrow; per the nephrology team it appears the patient would likely not need her outpatient dialysis -We will repeat her lab work in the a.m.  Leukocytosis, improved -Remains afebrile, hemodynamically stable -No obvious signs of infection; her WBC is now 7.1 -Continue to monitor off antibiotics -C/w CBC  New onset A. fib with RVR -Possibly likely related to sepsis -Currently rate controlled -Currently on oral Amiodarone 200 mg po Daily, may discontinue on discharge if continues to remain sinus rhythm but will continue for now  Thrombocytopenia -Resolved -Monitor closely for any signs of bleeding -Platelet Count is now 213,000 -Continue to Monitor and Repeat CBC in AM   Acute hypoxemic and hypercarbic respiratory failure -Chest x-ray showed atelectasis -She was on supplemental oxygen but now weaned off -SpO2: 93 % O2 Flow Rate (L/min): 0.5 L/min -Continue incentive spirometry -Repeat CXR in AM   Enterococcus UTI -S/p Completed antibiotics  Diabetes Mellitus Type 2 -C/w SSI, Lantus, Accu-Cheks, hypoglycemic protocol -CBG's ranging from 151-154  Obesity -Estimated body mass index is 35.01 kg/m as calculated from the following:   Height as of this encounter: 5' 0.98" (1.549 m).   Weight as of this encounter: 84 kg. -Weight Loss and Dietary Counseling given   Physical Deconditioning -PT/OT recommending Home Health -Out of bed as tolerated  Hypomagnesemia -Patient magnesium level is now 1.6 -Replete with IV mag  sulfate 2 g  -Continue monitor and replete as necessary -Repeat magnesium level in the a.m.  Hypokalemia -The patient's potassium is morning was 3.2  -replete with p.o. potassium chloride 20 mEq -Continue to monitor and replete as necessary -Repeat CMP in a.m.  Diarrhea, improved and resolved -Hold her stool softeners -Improved with Imodium and now patient states that she is having more formed bowel movements -Continue monitor closely and I do not clinically suspect that this is infectious diarrhea she is afebrile and has no leukocytosis -If continues to worsen will need at least a GI pathogen panel  Neck Swelling -On the right side where her prior catheter was -Not painful and is not tender to palpate -Patient states she has had this for years  -Outpatient work-up with a head and neck CT as patient states this is been going on for years.  DVT prophylaxis: SCDs Code Status: FULL CODE  Family Communication: No family present at bedside  Disposition Plan: Anticipate discharging home with home health next few days now that she is being dialyzed.  She will need to have a seat for outpatient dialysis and renal wants to observe her for a few more days to ensure that her renal function does not recover prior to her getting set up with outpatient dialysis.  Consultants:   Gastroenterology  Nephrology   Procedures: CRRT and hemodialysis   Antimicrobials:  Anti-infectives (From admission, onward)   Start     Dose/Rate Route Frequency Ordered Stop   12/27/19 0930  ceFAZolin (ANCEF) IVPB 2g/100 mL premix     2 g 200 mL/hr over 30 Minutes Intravenous  Once 12/27/19 0809 12/27/19 0910   12/27/19 0842  ceFAZolin (ANCEF) 2-4 GM/100ML-% IVPB    Note to Pharmacy: [place]   : cabinet override      12/27/19 0842 12/27/19 2044   12/15/19 2100  piperacillin-tazobactam (ZOSYN) IVPB 3.375 g  Status:  Discontinued     3.375 g 12.5 mL/hr over 240 Minutes Intravenous Every 6 hours  12/15/19 1717 12/15/19 1718   12/15/19 2100  piperacillin-tazobactam (ZOSYN) IVPB 3.375 g  Status:  Discontinued     3.375 g 100 mL/hr over 30 Minutes Intravenous Every 6 hours 12/15/19 1718 12/17/19 0849   12/15/19 1500  piperacillin-tazobactam (ZOSYN) IVPB 2.25 g  Status:  Discontinued     2.25 g 100 mL/hr over 30 Minutes Intravenous Every 6 hours 12/15/19 1452 12/15/19 1717   12/13/19 2200  piperacillin-tazobactam (ZOSYN) IVPB 3.375 g  Status:  Discontinued     3.375 g 12.5 mL/hr over 240 Minutes Intravenous Every 8 hours 12/13/19 2124 12/15/19 1452     Subjective: Seen and examined at bedside and she is doing okay today.  She is happy that her renal function is slightly improved from yesterday.  Continues to have some mild nausea but states that she is finally having formed stools and that the Imodium helped.  No more diarrhea.  Denies any chest pain, lightheadedness or dizziness.  No other concerns or complaints at this time and denies any shortness of breath.  Objective: Vitals:   12/31/19 1242 12/31/19 1349 12/31/19 2014 01/01/20 0452  BP: (!) 147/74  (!) 148/70 (!) 155/74  Pulse: 84  82 88  Resp: 18  17 19   Temp: 97.8 F (36.6 C)  98.3 F (36.8 C) 97.8 F (36.6 C)  TempSrc: Oral  Oral Oral  SpO2: 93% 92% 93% 93%  Weight:      Height:        Intake/Output Summary (Last 24 hours) at 01/01/2020 1152 Last data filed at 01/01/2020 3614 Gross per 24 hour  Intake 840 ml  Output 740 ml  Net 100 ml   Filed Weights   12/26/19 1123 12/28/19 1513 12/28/19 1829  Weight: 81.4 kg 85.7 kg 84 kg   Examination: Physical Exam:  Constitutional: WN/WD occasion female currently in no acute distress and appears calm sitting up in the bed Eyes: Lids and conjunctivae normal, sclerae anicteric  ENMT: External Ears, Nose appear normal. Grossly normal hearing. Neck: Has some right-sided neck swelling but no appreciable cervical masses.  Neck is supple and has no evidence of JVD and her  catheters have been removed. Respiratory: Mildly diminished to auscultation bilaterally, no wheezing, rales, rhonchi or crackles. Normal respiratory effort and patient is not tachypenic. No accessory muscle use.  Unlabored breathing Cardiovascular: Slightly irregularly irregular, no murmurs / rubs / gallops. S1 and S2 auscultated.  Mild extremity edema.  Abdomen: Soft, non-tender, distended secondary to body habitus. Bowel sounds positive.  GU: Deferred. Musculoskeletal: No clubbing / cyanosis of digits/nails. No joint deformity upper and lower extremities. Skin: No rashes, lesions, ulcers on limited skin evaluation. No induration; Warm and dry.  Neurologic: CN 2-12 grossly intact with no focal deficits. Romberg sign and cerebellar reflexes not assessed.  Psychiatric: Normal judgment and insight. Alert and oriented x 3.  Pleasant and  normal mood and appropriate affect.   Data Reviewed: I have personally reviewed following labs and imaging studies  CBC: Recent Labs  Lab 12/28/19 0420 12/29/19 0210 12/30/19 1418 12/31/19 0716 01/01/20 0334  WBC 7.5 7.5 9.8 8.0 7.1  NEUTROABS 5.6 5.8 8.1* 6.4 5.6  HGB 8.1* 8.3* 8.3* 8.3* 7.7*  HCT 25.0* 25.3* 25.8* 26.1* 23.8*  MCV 86.8 85.8 87.2 89.1 86.5  PLT 248 249 239 228 811   Basic Metabolic Panel: Recent Labs  Lab 12/28/19 0420 12/29/19 0210 12/30/19 1418 12/31/19 0716 01/01/20 0334  NA 128* 135 133* 132* 132*  K 3.4* 3.7 3.7 3.4* 3.2*  CL 92* 98 97* 96* 97*  CO2 22 27 24 23 23   GLUCOSE 137* 154* 175* 143* 137*  BUN 40* 18 33* 37* 41*  CREATININE 6.02* 3.86* 4.53* 4.68* 4.41*  CALCIUM 8.1* 8.1* 8.3* 8.5* 8.5*  MG  --  1.5* 1.9 1.7 1.6*  PHOS 6.0* 3.6 4.5 5.2* 4.6   GFR: Estimated Creatinine Clearance: 10.8 mL/min (A) (by C-G formula based on SCr of 4.41 mg/dL (H)). Liver Function Tests: Recent Labs  Lab 12/28/19 0420 12/29/19 0210 12/30/19 1418 12/31/19 0716 01/01/20 0334  AST  --  15 15  --   --   ALT  --  9 6  --   --    ALKPHOS  --  124 114  --   --   BILITOT  --  0.9 0.6  --   --   PROT  --  5.2* 5.2*  --   --   ALBUMIN 2.0* 2.2* 2.3* 2.3* 2.2*   No results for input(s): LIPASE, AMYLASE in the last 168 hours. No results for input(s): AMMONIA in the last 168 hours. Coagulation Profile: No results for input(s): INR, PROTIME in the last 168 hours. Cardiac Enzymes: No results for input(s): CKTOTAL, CKMB, CKMBINDEX, TROPONINI in the last 168 hours. BNP (last 3 results) No results for input(s): PROBNP in the last 8760 hours. HbA1C: No results for input(s): HGBA1C in the last 72 hours. CBG: Recent Labs  Lab 12/31/19 0916 12/31/19 1155 12/31/19 1612 12/31/19 2114 01/01/20 0742  GLUCAP 225* 154* 174* 152* 151*   Lipid Profile: No results for input(s): CHOL, HDL, LDLCALC, TRIG, CHOLHDL, LDLDIRECT in the last 72 hours. Thyroid Function Tests: No results for input(s): TSH, T4TOTAL, FREET4, T3FREE, THYROIDAB in the last 72 hours. Anemia Panel: No results for input(s): VITAMINB12, FOLATE, FERRITIN, TIBC, IRON, RETICCTPCT in the last 72 hours. Sepsis Labs: No results for input(s): PROCALCITON, LATICACIDVEN in the last 168 hours.  No results found for this or any previous visit (from the past 240 hour(s)).   RN Pressure Injury Documentation:     Estimated body mass index is 35.01 kg/m as calculated from the following:   Height as of this encounter: 5' 0.98" (1.549 m).   Weight as of this encounter: 84 kg.  Malnutrition Type:  Nutrition Problem: Inadequate oral intake Etiology: altered GI function, acute illness   Malnutrition Characteristics:  Signs/Symptoms: NPO status   Nutrition Interventions:  Interventions: Nepro shake, MVI, Magic cup   Radiology Studies: No results found. Scheduled Meds: . sodium chloride   Intravenous Once  . amiodarone  200 mg Oral Daily  . amLODipine  2.5 mg Oral Daily  . Chlorhexidine Gluconate Cloth  6 each Topical Daily  . Chlorhexidine Gluconate  Cloth  6 each Topical Q0600  . [START ON 01/03/2020] darbepoetin (ARANESP) injection - NON-DIALYSIS  60 mcg Subcutaneous Once  .  feeding supplement (NEPRO CARB STEADY)  237 mL Oral TID BM  . fluticasone  1 spray Each Nare Daily  . insulin aspart  0-5 Units Subcutaneous QHS  . insulin aspart  0-9 Units Subcutaneous TID WC  . insulin glargine  14 Units Subcutaneous Daily  . multivitamin  1 tablet Oral QHS  . pantoprazole  40 mg Oral BID AC  . sodium chloride flush  10-40 mL Intracatheter Q12H  . sucralfate  1 g Oral Q6H   Continuous Infusions: . sodium chloride Stopped (12/28/19 2146)    LOS: 19 days   Kerney Elbe, DO [facility] PAGER is on AMION  If 7PM-7AM, please contact night-coverage [email or web address]

## 2020-01-01 NOTE — Plan of Care (Signed)

## 2020-01-01 NOTE — Progress Notes (Signed)
Nephrology Progress Note:   Patient ID: Katherine Oliver, female   DOB: February 18, 1946, 74 y.o.   MRN: [id number]    S:  Had 1 liter UOP over 2/27.  Last had HD on 2/24 with 1.5 liters UF.  We discussed the risks, benefits, and indications for blood transfusion and she consents to receiving blood.  Has some neck swelling same side as her old catheter site not pain or bruising and states has been there for years.  Post void residual 15.  Review of systems      Denies shortness of breath  Continues with nausea intermittently. No vomiting.  Using PRNs still  Diarrhea better  No dark stool or blood per rectum.   O:BP (!) 155/74 (BP Location: Right Arm)   Pulse 88   Temp 97.8 F (36.6 C) (Oral)   Resp 19   Ht 5' 0.98" (1.549 m)   Wt 84 kg   SpO2 93%   BMI 35.01 kg/m   Intake/Output Summary (Last 24 hours) at 01/01/2020 9449 Last data filed at 01/01/2020 0630 Gross per 24 hour  Intake 600 ml  Output 1000 ml  Net -400 ml   Intake/Output: I/O last 3 completed shifts: In: 1200 [P.O.:1200] Out: 1900 [Urine:1900]  Intake/Output this shift:  No intake/output data recorded. Weight change:    Physical exam:   Gen: adult female NAD CVS: S1S2 no rub Resp: cta unlabored and on room air  Abd: soft, nontender; obese habitus Ext: no pitting edema Neuro alert and oriented x3 ; provides hx and follows commands  Psych normal mood and affect Access left chest tunneled catheter    Recent Labs  Lab 12/26/19 0417 12/27/19 0405 12/28/19 0420 12/29/19 0210 12/30/19 1418 12/31/19 0716 01/01/20 0334  NA 123* 130* 128* 135 133* 132* 132*  K 3.9 3.6 3.4* 3.7 3.7 3.4* 3.2*  CL 86* 92* 92* 98 97* 96* 97*  CO2 20* 23 22 27 24 23 23   GLUCOSE 120* 130* 137* 154* 175* 143* 137*  BUN 63* 34* 40* 18 33* 37* 41*  CREATININE 7.43* 5.29* 6.02* 3.86* 4.53* 4.68* 4.41*  ALBUMIN 2.1* 2.0* 2.0* 2.2* 2.3* 2.3* 2.2*  CALCIUM 7.9* 8.0* 8.1* 8.1* 8.3* 8.5* 8.5*  PHOS 8.4* 5.9* 6.0* 3.6 4.5 5.2* 4.6   AST  --   --   --  15 15  --   --   ALT  --   --   --  9 6  --   --    Liver Function Tests: Recent Labs  Lab 12/29/19 0210 12/29/19 0210 12/30/19 1418 12/31/19 0716 01/01/20 0334  AST 15  --  15  --   --   ALT 9  --  6  --   --   ALKPHOS 124  --  114  --   --   BILITOT 0.9  --  0.6  --   --   PROT 5.2*  --  5.2*  --   --   ALBUMIN 2.2*   < > 2.3* 2.3* 2.2*   < > = values in this interval not displayed.   CBC: Recent Labs  Lab 12/28/19 0420 12/28/19 0420 12/29/19 0210 12/29/19 0210 12/30/19 1418 12/31/19 0716 01/01/20 0334  WBC 7.5   < > 7.5   < > 9.8 8.0 7.1  NEUTROABS 5.6   < > 5.8   < > 8.1* 6.4 5.6  HGB 8.1*   < > 8.3*   < > 8.3* 8.3* 7.7*  HCT 25.0*   < > 25.3*   < > 25.8* 26.1* 23.8*  MCV 86.8  --  85.8  --  87.2 89.1 86.5  PLT 248   < > 249   < > 239 228 213   < > = values in this interval not displayed.   Studies/Results: No results found. Marland Kitchen amiodarone  200 mg Oral Daily  . Chlorhexidine Gluconate Cloth  6 each Topical Daily  . Chlorhexidine Gluconate Cloth  6 each Topical Q0600  . feeding supplement (NEPRO CARB STEADY)  237 mL Oral TID BM  . fluticasone  1 spray Each Nare Daily  . insulin aspart  0-5 Units Subcutaneous QHS  . insulin aspart  0-9 Units Subcutaneous TID WC  . insulin glargine  14 Units Subcutaneous Daily  . multivitamin  1 tablet Oral QHS  . pantoprazole  40 mg Oral BID AC  . sodium chloride flush  10-40 mL Intracatheter Q12H  . sucralfate  1 g Oral Q6H    BMET    Component Value Date/Time   NA 132 (L) 01/01/2020 0334   K 3.2 (L) 01/01/2020 0334   CL 97 (L) 01/01/2020 0334   CO2 23 01/01/2020 0334   GLUCOSE 137 (H) 01/01/2020 0334   BUN 41 (H) 01/01/2020 0334   CREATININE 4.41 (H) 01/01/2020 0334   CREATININE 1.03 10/20/2013 1751   CALCIUM 8.5 (L) 01/01/2020 0334   GFRNONAA 9 (L) 01/01/2020 0334   GFRAA 11 (L) 01/01/2020 0334   CBC    Component Value Date/Time   WBC 7.1 01/01/2020 0334   RBC 2.75 (L) 01/01/2020 0334    HGB 7.7 (L) 01/01/2020 0334   HCT 23.8 (L) 01/01/2020 0334   PLT 213 01/01/2020 0334   MCV 86.5 01/01/2020 0334   MCH 28.0 01/01/2020 0334   MCHC 32.4 01/01/2020 0334   RDW 15.3 01/01/2020 0334   LYMPHSABS 0.8 01/01/2020 0334   MONOABS 0.6 01/01/2020 0334   EOSABS 0.0 01/01/2020 0334   BASOSABS 0.0 01/01/2020 0334    Assessment/Plan:  1. AKI- in setting of sepsis and severe acute pancreatitis +/- IV contrast exposure. Baseline Cr 1 -1.2. CRRT started on 12/15/19 - 2/14 then transitioned to iHD.  Last HD 2/24.   - Hold HD today and assess dialysis needs daily  - gave K 20 meq once and mag - transfuse PRBC's today to optimize anemia for renal recovery - Will continue to follow inpatient for renal recovery though she has an AKI spot at [facility] TTS at 11:20 am with 10:40 arrival for first treatment time if one is needed.  Does not appear that she will need outpatient HD at this time.  2. Acute gallstone pacreatitis- s/p ERCP with biliary stent placement.  3. Anemia of critical illness and GIB- stable. Continue to follow and transfuse prn.  aranesp 40 mcg once on 2/23 - will increase dose to 60 mcg on 3/2.  Transfuse packed red blood cells on 2/28 .   4. Atrial fibrillation  - with hx RVR.  Per primary team  5. Leukocytosis- resolved.  6. Hyponatremia - resolved with HD and mild  7. HTN - Start amlodipine 2.5 mg daily for now. No home meds listed for HTN.  Disposition: Actively monitoring for recovery and needs to remain inpatient   Claudia Desanctis, MD 01/01/2020 8:31 AM

## 2020-01-02 HISTORY — PX: ESOPHAGOGASTRODUODENOSCOPY: SHX1529

## 2020-01-02 HISTORY — PX: ERCP: SHX60

## 2020-01-02 LAB — CBC WITH DIFFERENTIAL/PLATELET
Abs Immature Granulocytes: 0.07 10*3/uL (ref 0.00–0.07)
Basophils Absolute: 0 10*3/uL (ref 0.0–0.1)
Basophils Relative: 0 %
Eosinophils Absolute: 0 10*3/uL (ref 0.0–0.5)
Eosinophils Relative: 0 %
HCT: 28.7 % — ABNORMAL LOW (ref 36.0–46.0)
Hemoglobin: 9.4 g/dL — ABNORMAL LOW (ref 12.0–15.0)
Immature Granulocytes: 1 %
Lymphocytes Relative: 10 %
Lymphs Abs: 0.8 10*3/uL (ref 0.7–4.0)
MCH: 28.1 pg (ref 26.0–34.0)
MCHC: 32.8 g/dL (ref 30.0–36.0)
MCV: 85.9 fL (ref 80.0–100.0)
Monocytes Absolute: 0.6 10*3/uL (ref 0.1–1.0)
Monocytes Relative: 8 %
Neutro Abs: 6.6 10*3/uL (ref 1.7–7.7)
Neutrophils Relative %: 81 %
Platelets: 203 10*3/uL (ref 150–400)
RBC: 3.34 MIL/uL — ABNORMAL LOW (ref 3.87–5.11)
RDW: 15.8 % — ABNORMAL HIGH (ref 11.5–15.5)
WBC: 8.1 10*3/uL (ref 4.0–10.5)
nRBC: 0 % (ref 0.0–0.2)

## 2020-01-02 LAB — BPAM RBC
Blood Product Expiration Date: 202103192359
ISSUE DATE / TIME: 202102281302
Unit Type and Rh: 7300

## 2020-01-02 LAB — COMPREHENSIVE METABOLIC PANEL
ALT: <5 U/L (ref 0–44)
AST: 29 U/L (ref 15–41)
Albumin: 2.4 g/dL — ABNORMAL LOW (ref 3.5–5.0)
Alkaline Phosphatase: 93 U/L (ref 38–126)
Anion gap: 12 (ref 5–15)
BUN: 40 mg/dL — ABNORMAL HIGH (ref 8–23)
CO2: 22 mmol/L (ref 22–32)
Calcium: 8.6 mg/dL — ABNORMAL LOW (ref 8.9–10.3)
Chloride: 101 mmol/L (ref 98–111)
Creatinine, Ser: 3.84 mg/dL — ABNORMAL HIGH (ref 0.44–1.00)
GFR calc Af Amer: 13 mL/min — ABNORMAL LOW (ref >60)
GFR calc non Af Amer: 11 mL/min — ABNORMAL LOW (ref >60)
Glucose, Bld: 145 mg/dL — ABNORMAL HIGH (ref 70–99)
Potassium: 4 mmol/L (ref 3.5–5.1)
Sodium: 135 mmol/L (ref 135–145)
Total Bilirubin: 1.2 mg/dL (ref 0.3–1.2)
Total Protein: 5.2 g/dL — ABNORMAL LOW (ref 6.5–8.1)

## 2020-01-02 LAB — GLUCOSE, CAPILLARY
Glucose-Capillary: 151 mg/dL — ABNORMAL HIGH (ref 70–99)
Glucose-Capillary: 143 mg/dL — ABNORMAL HIGH (ref 70–99)
Glucose-Capillary: 156 mg/dL — ABNORMAL HIGH (ref 70–99)
Glucose-Capillary: 139 mg/dL — ABNORMAL HIGH (ref 70–99)

## 2020-01-02 LAB — TYPE AND SCREEN
ABO/RH(D): B POS
Antibody Screen: NEGATIVE
Unit division: 0

## 2020-01-02 LAB — MAGNESIUM: Magnesium: 1.9 mg/dL (ref 1.7–2.4)

## 2020-01-02 LAB — PHOSPHORUS: Phosphorus: 4.3 mg/dL (ref 2.5–4.6)

## 2020-01-02 NOTE — Progress Notes (Signed)
Physical Therapy Treatment Patient Details Name: Katherine Oliver MRN: [id number] DOB: Jun 03, 1946 Today's Date: 01/02/2020    History of Present Illness 74 yo admitted 2/9 with pancreatitis and UTI s/p ERCP with Afib 2/12. PMHx: DM, HTN, CKD, obesity, sepsis, ARF, Rt TKA    PT Comments    Pt supine on arrival and reports increased mobility lately but not walking in hall other than with therapy. Pt moving well with basic transfers and gait with encouragement to maximize distance and HEP.     Follow Up Recommendations  Home health PT;Supervision for mobility/OOB     Equipment Recommendations  None recommended by PT    Recommendations for Other Services       Precautions / Restrictions Precautions Precautions: Fall    Mobility  Bed Mobility Overal bed mobility: Modified Independent Bed Mobility: Supine to Sit;Sit to Supine           General bed mobility comments: HOb 15 degrees with use of rail, no physical assist  Transfers Overall transfer level: Modified independent               General transfer comment: pt transferred from bed and toilet without assist  Ambulation/Gait Ambulation/Gait assistance: Supervision Gait Distance (Feet): 150 Feet Assistive device: Rolling walker (2 wheeled) Gait Pattern/deviations: Step-through pattern;Decreased stride length;Trunk flexed   Gait velocity interpretation: >2.62 ft/sec, indicative of community ambulatory General Gait Details: cues and encouragement to maximize distance   Stairs             Wheelchair Mobility    Modified Rankin (Stroke Patients Only)       Balance Overall balance assessment: Mild deficits observed, not formally tested                                          Cognition Arousal/Alertness: Awake/alert Behavior During Therapy: WFL for tasks assessed/performed Overall Cognitive Status: Within Functional Limits for tasks assessed                                         Exercises General Exercises - Lower Extremity Long Arc Quad: AROM;Both;Seated;20 reps Hip ABduction/ADduction: AROM;Both;Seated;15 reps Hip Flexion/Marching: AROM;Both;Seated;15 reps Toe Raises: AROM;Both;Seated;15 reps Heel Raises: AROM;Seated;Both;15 reps    General Comments        Pertinent Vitals/Pain Pain Assessment: No/denies pain    Home Living                      Prior Function            PT Goals (current goals can now be found in the care plan section) Progress towards PT goals: Progressing toward goals    Frequency    Min 3X/week      PT Plan      Co-evaluation              AM-PAC PT "6 Clicks" Mobility   Outcome Measure  Help needed turning from your back to your side while in a flat bed without using bedrails?: None Help needed moving from lying on your back to sitting on the side of a flat bed without using bedrails?: None Help needed moving to and from a bed to a chair (including a wheelchair)?: None Help needed standing up from a chair using your  arms (e.g., wheelchair or bedside chair)?: None Help needed to walk in hospital room?: A Little Help needed climbing 3-5 steps with a railing? : A Little 6 Click Score: 22    End of Session   Activity Tolerance: Patient tolerated treatment well Patient left: in bed;with call bell/phone within reach Nurse Communication: Mobility status PT Visit Diagnosis: Other abnormalities of gait and mobility (R26.89)     Time: 4720-7218 PT Time Calculation (min) (ACUTE ONLY): 16 min  Charges:  $Therapeutic Activity: 8-22 mins                     Andreia Gandolfi P, PT Acute Rehabilitation Services Pager: [phone] Office: [place] 01/02/2020, 12:41 PM

## 2020-01-02 NOTE — Progress Notes (Signed)
Occupational Therapy Treatment Patient Details Name: Jacia Sickman MRN: [id number] DOB: 12-23-1945 Today's Date: 01/02/2020    History of present illness 74 yo admitted 2/9 with pancreatitis and UTI s/p ERCP with Afib 2/12. PMHx: DM, HTN, CKD, obesity, sepsis, ARF, Rt TKA   OT comments  Pt currently performing functional mobility in hallway to simulate home distance with Supervision and RW. Pt reporting fatigue and demonstrating decreased activity tolerance. Pt would benefit from education on [facility] for ADLs to optimize independence and safety. Pt drying and brushing her hair while sitting at EOB (after using shampoo cap) with supervision. Continue to recommend dc to home with HHOT and will continue to follow acutely as admitted.    Follow Up Recommendations  Home health OT;Supervision - Intermittent    Equipment Recommendations  3 in 1 bedside commode    Recommendations for Other Services      Precautions / Restrictions Precautions Precautions: Fall Precaution Comments: watch O2 Restrictions Weight Bearing Restrictions: No       Mobility Bed Mobility Overal bed mobility: Modified Independent Bed Mobility: Supine to Sit;Sit to Supine     Supine to sit: Supervision;HOB elevated Sit to supine: Supervision   General bed mobility comments: use of rail. No physical A needed  Transfers Overall transfer level: Modified independent Equipment used: Rolling walker (2 wheeled) Transfers: Sit to/from Stand Sit to Stand: Supervision         General transfer comment: Supervision for safety    Balance Overall balance assessment: Mild deficits observed, not formally tested                                         ADL either performed or assessed with clinical judgement   ADL Overall ADL's : Needs assistance/impaired     Grooming: Set up;Supervision/safety;Brushing hair;Sitting Grooming Details (indicate cue type and reason): Pt brushing her hair and  drying it after shampoo cap                 Toilet Transfer: Supervision/safety;Ambulation;RW(simulated in room) Toilet Transfer Details (indicate cue type and reason): Supervision for safety         Functional mobility during ADLs: Supervision/safety;Rolling walker General ADL Comments: Pt performing functional mobility in hallway to simulate home distance. Pt reporting fatigue and presenting with decreased activity tolerance. Pt performing grooming tasks (shampoo and brush hair) while sitting at EOB.      Vision       Perception     Praxis      Cognition Arousal/Alertness: Awake/alert Behavior During Therapy: WFL for tasks assessed/performed Overall Cognitive Status: Within Functional Limits for tasks assessed                                          Exercises     Shoulder Instructions       General Comments Husband present throughout.     Pertinent Vitals/ Pain       Pain Assessment: No/denies pain  Home Living                                          Prior Functioning/Environment  Frequency  Min 2X/week        Progress Toward Goals  OT Goals(current goals can now be found in the care plan section)  Progress towards OT goals: Progressing toward goals  Acute Rehab OT Goals Patient Stated Goal: return home OT Goal Formulation: With patient Time For Goal Achievement: 01/05/20 Potential to Achieve Goals: Good ADL Goals Pt Will Perform Grooming: with modified independence;sitting;standing Pt Will Perform Upper Body Dressing: with modified independence;sitting Pt Will Perform Lower Body Dressing: with modified independence;sit to/from stand;sitting/lateral leans;with adaptive equipment Pt Will Transfer to Toilet: with modified independence;ambulating;grab bars;regular height toilet Pt Will Perform Toileting - Clothing Manipulation and hygiene: with modified independence;sitting/lateral leans;sit  to/from stand  Plan Discharge plan remains appropriate    Co-evaluation                 AM-PAC OT "6 Clicks" Daily Activity     Outcome Measure   Help from another person eating meals?: None Help from another person taking care of personal grooming?: A Little Help from another person toileting, which includes using toliet, bedpan, or urinal?: A Little Help from another person bathing (including washing, rinsing, drying)?: A Lot Help from another person to put on and taking off regular upper body clothing?: A Little Help from another person to put on and taking off regular lower body clothing?: A Lot 6 Click Score: 17    End of Session Equipment Utilized During Treatment: Rolling walker  OT Visit Diagnosis: Other abnormalities of gait and mobility (R26.89);Pain;Muscle weakness (generalized) (M62.81) Pain - Right/Left: Left Pain - part of body: (flank and back)   Activity Tolerance Patient tolerated treatment well   Patient Left in bed;with call bell/phone within reach   Nurse Communication Mobility status        Time: 6754-4920 OT Time Calculation (min): 22 min  Charges: OT General Charges $OT Visit: 1 Visit OT Treatments $Self Care/Home Management : 8-22 mins  [place], OTR/L Acute Rehab Pager: [phone] Office: [employer] 01/02/2020, 5:01 PM

## 2020-01-02 NOTE — Progress Notes (Signed)
PROGRESS NOTE    Katherine Oliver  OEU:[id number] DOB: 08/24/1946 DOA: 12/13/2019 PCP: [name], MD   Brief Narrative:  HPI from PCCM Patient is a 74 year old obese Caucasian female with a past medical significant for but not limited to gallstone pancreatitis and UTI.  She had an ERCP on 2/10 with stent placement and PCCM was consulted to admit worsening pancreatic pain, increased tachycardia and increased work of breathing supplemental oxygen use.  She had a worsening AKI.  General surgery was consulted due to increased intrabladder pressure and concern for possible abdominal compartment syndrome.  On 12/16/2019 when she went into A. fib with RVR and started on diltiazem and later became hypotensive and switched to amiodarone.  Patient also had an EGD done on 12/19/2019 due to possible GI bleed and EGD showed stress ulcerations.  Due to some improvement in her overall clinical status tried hospitalist assumed care on 12/21/2019.  Because her renal function has not improved significantly for her CKD she is started on hemodialysis and had a tunneled catheter placed.  Nephrology has now placed in order to remove her nontunneled catheter and they want to continue to follow for renal recovery though they have contacted the social worker for hemodialysis regarding outpatient unit should one be needed and this is in the process of being set up.  Nephrology still wants to continue to monitor closely as her labs did not worsen that much compared to yesterday.  Dr. Royce Macadamia has now removed her Foley catheter and nephrology is actively monitoring for renal recovery and because of this she needs to remain inpatient.  01/01/20 Today her renal function looks slightly better than the day before.  Nephrology is transfusing 1 unit PRBCs to optimize anemia for renal recovery and recommends holding hemodialysis again today and nephrology feels that she will not appear to need outpatient hemodialysis at this  time.  01/02/20 Renal function improved a little bit more today.  Nephrology still to evaluate.  Patient states that her diarrhea is improved.  Continues to have her tunneled dialysis catheter but may be removed in the next few days by nephrology.  Will defer to nephrology when to safely discharge.  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, without long-term current use of insulin (HCC)   HTN (hypertension)   Dyslipidemia associated with type 2 diabetes mellitus (HCC)   NAFLD (nonalcoholic fatty liver disease)   GERD (gastroesophageal reflux disease)   Polycythemia   Obesity, Class I, BMI 30-34.9   CKD stage 3 due to type 2 diabetes mellitus (HCC)   Acute gallstone pancreatitis   Sepsis ([place])   Acute renal failure (ARF) ([place])   Acute respiratory failure (HCC)   Choledocholithiasis   Gallstone pancreatitis post ERCP with biliary stent placement, improved Acute Hepatitis, improved  -LFTs improved and not checked the last few days but on last check her AST is 15 and her ALT is 6 -GI on board, continue supportive care and advance diet as tolerated -Continue PPI, Carafate -On discharge she will need to follow-up with GI to address her stent and repeat ERCP in the coming weeks  Acute blood loss anemia/GI bleeding 2/2 gastric/stress ulcers -Status post 4 units of PRBC, last transfusion of 1U of PRBC on 01/01/20 -GI on board, continue supportive care and advance diet as tolerated -Continue PPI, Carafate -Patient was given Aranesp 40 mcg once on 2/23 -Continue to Monitor Blood Count; has been relatively stable but did  drop slightly and went from 8.3/26.1 top 7.7/20.8; after blood transfusion yesterday her H&H was stable at 9.5/20.7 today is 9.4/20.7 -Continue to monitor for signs and symptoms of bleeding; currently no overt signs and symptoms of bleeding -Repeat CBC in a.m.  AKI with oliguria/anuric/hyponatremia -In the setting  of sepsis and severe acute pancreatitis -Status post CRRT, started on 2/11, stopped on 2/14 -Currently on HD (M/W/F) and likely will get dialyzed tomorrow per nephrology and her dialysis session today was held as renal wants to see her function tomorrow -Nephrology on board -S/P TDC on 12/27/19 by IR -BUN/Cr went from 18/3.86 -> 33/4.53 -> 37/4.68 -> 41/4.41 and is slightly improved today again and is now 40/3.84 -Na+ was 128 has been fluctuating and went from now 1 32-1 35 -Further Care Per Nephrology -Outpatient HD referral made by the nephrology social worker just in case this patient would require this but nephrology still wants to continue to monitor to see if her renal function improves the next couple days and nephrology will continue to follow for renal recovery and nephrology wants to evaluate her renal function again tomorrow; per the nephrology team it appears the patient would likely not need her outpatient dialysis and will defer to them to remove tunneled dialysis catheter and defer to them when to safely discharge her home -We will repeat her lab work in the a.m.  Leukocytosis, improved -Remains afebrile, hemodynamically stable -No obvious signs of infection; her WBC is now 8.1 -Continue to monitor off antibiotics -C/w CBC  New onset A. fib with RVR -Possibly likely related to sepsis -Currently rate controlled -Currently on oral Amiodarone 200 mg po Daily, may discontinue on discharge if continues to remain sinus rhythm but will continue for now  Thrombocytopenia -Resolved -Monitor closely for any signs of bleeding -Platelet Count is now slowly trending down and went from 228 is now 203 -Continue to Monitor and Repeat CBC in AM   Acute hypoxemic and hypercarbic respiratory failure -Chest x-ray showed atelectasis -She was on supplemental oxygen but now weaned off -SpO2: 94 % O2 Flow Rate (L/min): 2 L/min -Continue incentive spirometry -Repeat CXR in AM if felt  necessary  Enterococcus UTI -S/p Completed antibiotics  Diabetes Mellitus Type 2 -C/w SSI, Lantus, Accu-Cheks, hypoglycemic protocol -CBG's ranging from 151-154  Obesity -Estimated body mass index is 35.01 kg/m as calculated from the following:   Height as of this encounter: 5' 0.98" (1.549 m).   Weight as of this encounter: 84 kg. -Weight Loss and Dietary Counseling given   Physical Deconditioning -PT/OT recommending Home Health -Out of bed as tolerated  Hypomagnesemia -Patient magnesium level was 1.6 and now improved to 1.9 after repletion -Continue monitor and replete as necessary -Repeat magnesium level in the a.m.  Hypokalemia -The patient's potassium was 3.2 and is now 4.0 -Continue to monitor and replete as necessary -Repeat CMP in a.m.  Diarrhea, improved and resolved -Hold her stool softeners -Improved with Imodium and now patient states that she is having more formed bowel movements -Continue monitor closely and I do not clinically suspect that this is infectious diarrhea she is afebrile and has no leukocytosis -If continues to worsen will need at least a GI pathogen panel  Neck Swelling -On the right side where her prior catheter was -Not painful and is not tender to palpate -Patient states she has had this for years  -Outpatient work-up with a head and neck CT as patient states this is been going on for years.  DVT  prophylaxis: SCDs Code Status: FULL CODE  Family Communication: No family present at bedside  Disposition Plan: Anticipate discharging home with home health next few days now that she is being dialyzed.  She has a seat for outpatient dialysis and renal wants to observe her for a few more days to ensure that her renal function does not recover prior to her getting set up with outpatient dialysis.  Defer to nephrology when okay to discharge  Consultants:   Gastroenterology  Nephrology   Procedures: CRRT and hemodialysis    Antimicrobials:  Anti-infectives (From admission, onward)   Start     Dose/Rate Route Frequency Ordered Stop   12/27/19 0930  ceFAZolin (ANCEF) IVPB 2g/100 mL premix     2 g 200 mL/hr over 30 Minutes Intravenous  Once 12/27/19 0809 12/27/19 0910   12/27/19 0842  ceFAZolin (ANCEF) 2-4 GM/100ML-% IVPB    Note to Pharmacy: [place]   : cabinet override      12/27/19 0842 12/27/19 2044   12/15/19 2100  piperacillin-tazobactam (ZOSYN) IVPB 3.375 g  Status:  Discontinued     3.375 g 12.5 mL/hr over 240 Minutes Intravenous Every 6 hours 12/15/19 1717 12/15/19 1718   12/15/19 2100  piperacillin-tazobactam (ZOSYN) IVPB 3.375 g  Status:  Discontinued     3.375 g 100 mL/hr over 30 Minutes Intravenous Every 6 hours 12/15/19 1718 12/17/19 0849   12/15/19 1500  piperacillin-tazobactam (ZOSYN) IVPB 2.25 g  Status:  Discontinued     2.25 g 100 mL/hr over 30 Minutes Intravenous Every 6 hours 12/15/19 1452 12/15/19 1717   12/13/19 2200  piperacillin-tazobactam (ZOSYN) IVPB 3.375 g  Status:  Discontinued     3.375 g 12.5 mL/hr over 240 Minutes Intravenous Every 8 hours 12/13/19 2124 12/15/19 1452     Subjective: Seen and examined at bedside and states that she is doing well and actually got some sleep.  Not having any more diarrhea.  Happy that her renal function is improving.  No chest pain, lightheadedness or dizziness.  No other concerns or complaints at this time. Objective: Vitals:   01/01/20 1339 01/01/20 1549 01/01/20 2103 01/02/20 0416  BP: (!) 156/57 (!) 150/66 (!) 155/70 (!) 164/85  Pulse: 98 90 86 90  Resp: 18 18 18 18   Temp: 98.1 F (36.7 C) 98.1 F (36.7 C) 98.6 F (37 C) 97.6 F (36.4 C)  TempSrc: Oral Oral Oral Oral  SpO2: 95% 95% 93% 94%  Weight:      Height:        Intake/Output Summary (Last 24 hours) at 01/02/2020 1154 Last data filed at 01/02/2020 0900 Gross per 24 hour  Intake 742.67 ml  Output 800 ml  Net -57.33 ml   Filed Weights   12/26/19 1123 12/28/19 1513  12/28/19 1829  Weight: 81.4 kg 85.7 kg 84 kg   Examination: Physical Exam:  Constitutional: WN/WD obese Caucasian female currently in no acute distress appears calm laying in the bed Eyes: Lids and conjunctivae normal, sclerae anicteric  ENMT: External Ears, Nose appear normal. Grossly normal hearing  Neck: Has some neck swelling but is not painful and no appreciable masses.  Neck is supple no evidence of JVD her catheter has been removed on the right side but still has a left IJ tunneled catheter Respiratory: Mildly diminished to auscultation bilaterally, no wheezing, rales, rhonchi or crackles. Normal respiratory effort and patient is not tachypenic. No accessory muscle use.  Unlabored breathing is not wearing supplemental oxygen via nasal cannula Cardiovascular:  RRR, no murmurs / rubs / gallops. S1 and S2 auscultated.  Trace lower extremity edema Abdomen: Soft, non-tender, distended secondary body habitus. Bowel sounds positive x4.  GU: Deferred. Musculoskeletal: No clubbing / cyanosis of digits/nails. No joint deformity upper and lower extremities.  Skin: No rashes, lesions, ulcers on a limited skin evaluation. No induration; Warm and dry.  Neurologic: CN 2-12 grossly intact with no focal deficits. Romberg sign and cerebellar reflexes not assessed.  Psychiatric: Normal judgment and insight. Alert and oriented x 3. Pleasant and normal mood and appropriate affect.   Data Reviewed: I have personally reviewed following labs and imaging studies  CBC: Recent Labs  Lab 12/29/19 0210 12/29/19 0210 12/30/19 1418 12/31/19 0716 01/01/20 0334 01/01/20 1849 01/02/20 0340  WBC 7.5  --  9.8 8.0 7.1  --  8.1  NEUTROABS 5.8  --  8.1* 6.4 5.6  --  6.6  HGB 8.3*   < > 8.3* 8.3* 7.7* 9.5* 9.4*  HCT 25.3*   < > 25.8* 26.1* 23.8* 28.7* 28.7*  MCV 85.8  --  87.2 89.1 86.5  --  85.9  PLT 249  --  239 228 213  --  203   < > = values in this interval not displayed.   Basic Metabolic Panel: Recent  Labs  Lab 12/29/19 0210 12/30/19 1418 12/31/19 0716 01/01/20 0334 01/02/20 0340  NA 135 133* 132* 132* 135  K 3.7 3.7 3.4* 3.2* 4.0  CL 98 97* 96* 97* 101  CO2 27 24 23 23 22   GLUCOSE 154* 175* 143* 137* 145*  BUN 18 33* 37* 41* 40*  CREATININE 3.86* 4.53* 4.68* 4.41* 3.84*  CALCIUM 8.1* 8.3* 8.5* 8.5* 8.6*  MG 1.5* 1.9 1.7 1.6* 1.9  PHOS 3.6 4.5 5.2* 4.6 4.3   GFR: Estimated Creatinine Clearance: 12.4 mL/min (A) (by C-G formula based on SCr of 3.84 mg/dL (H)). Liver Function Tests: Recent Labs  Lab 12/29/19 0210 12/30/19 1418 12/31/19 0716 01/01/20 0334 01/02/20 0340  AST 15 15  --   --  29  ALT 9 6  --   --  <5  ALKPHOS 124 114  --   --  93  BILITOT 0.9 0.6  --   --  1.2  PROT 5.2* 5.2*  --   --  5.2*  ALBUMIN 2.2* 2.3* 2.3* 2.2* 2.4*   No results for input(s): LIPASE, AMYLASE in the last 168 hours. No results for input(s): AMMONIA in the last 168 hours. Coagulation Profile: No results for input(s): INR, PROTIME in the last 168 hours. Cardiac Enzymes: No results for input(s): CKTOTAL, CKMB, CKMBINDEX, TROPONINI in the last 168 hours. BNP (last 3 results) No results for input(s): PROBNP in the last 8760 hours. HbA1C: No results for input(s): HGBA1C in the last 72 hours. CBG: Recent Labs  Lab 01/01/20 0742 01/01/20 1215 01/01/20 1648 01/01/20 2059 01/02/20 0733  GLUCAP 151* 136* 178* 163* 151*   Lipid Profile: No results for input(s): CHOL, HDL, LDLCALC, TRIG, CHOLHDL, LDLDIRECT in the last 72 hours. Thyroid Function Tests: No results for input(s): TSH, T4TOTAL, FREET4, T3FREE, THYROIDAB in the last 72 hours. Anemia Panel: No results for input(s): VITAMINB12, FOLATE, FERRITIN, TIBC, IRON, RETICCTPCT in the last 72 hours. Sepsis Labs: No results for input(s): PROCALCITON, LATICACIDVEN in the last 168 hours.  No results found for this or any previous visit (from the past 240 hour(s)).   RN Pressure Injury Documentation:     Estimated body mass index  is 35.01 kg/m  as calculated from the following:   Height as of this encounter: 5' 0.98" (1.549 m).   Weight as of this encounter: 84 kg.  Malnutrition Type:  Nutrition Problem: Inadequate oral intake Etiology: altered GI function, acute illness   Malnutrition Characteristics:  Signs/Symptoms: NPO status   Nutrition Interventions:  Interventions: Nepro shake, MVI, Magic cup   Radiology Studies: No results found. Scheduled Meds: . amiodarone  200 mg Oral Daily  . amLODipine  2.5 mg Oral Daily  . Chlorhexidine Gluconate Cloth  6 each Topical Daily  . Chlorhexidine Gluconate Cloth  6 each Topical Q0600  . [START ON 01/03/2020] darbepoetin (ARANESP) injection - NON-DIALYSIS  60 mcg Subcutaneous Once  . feeding supplement (NEPRO CARB STEADY)  237 mL Oral TID BM  . fluticasone  1 spray Each Nare Daily  . insulin aspart  0-5 Units Subcutaneous QHS  . insulin aspart  0-9 Units Subcutaneous TID WC  . insulin glargine  14 Units Subcutaneous Daily  . multivitamin  1 tablet Oral QHS  . pantoprazole  40 mg Oral BID AC  . sodium chloride flush  10-40 mL Intracatheter Q12H  . sucralfate  1 g Oral Q6H   Continuous Infusions: . sodium chloride Stopped (12/28/19 2146)    LOS: 20 days   Kerney Elbe, DO [facility] PAGER is on [place]  If 7PM-7AM, please contact night-coverage [email or web address]

## 2020-01-02 NOTE — Progress Notes (Signed)
[facility] Progress Note    Assessment/ Plan:   1. AKI- in setting of sepsis and severe acute pancreatitis +/- IV contrast exposure. Baseline Cr 1 -1.2. CRRT started on 12/15/19 - 2/14 then transitioned to iHD.  Last HD 2/24.   - Hold HD today and assess dialysis needs daily  - gave K 20 meq once and mag - transfuse PRBC's today to optimize anemia for renal recovery - Will continue to follow inpatient for renal recovery though she has an AKI spot at [facility] TTS at 11:20 am with 10:40 arrival for first treatment time if one is needed.    Right now it  Appears she's starting to regain renal function; her cr has been decreasing despite not receiving HD since 2/24.  I would recommend keeping the [facility] TC until later in the week to ensure she doesn't trend the wrong direction.  2. Acute gallstone pacreatitis- s/p ERCP with biliary stent placement.  3. Anemia of critical illness and GIB- stable. Continue to follow and transfuse prn.  aranesp 40 mcg once on 2/23 - will increase dose to 60 mcg on 3/2.  Transfuse packed red blood cells on 2/28 .   4. Atrial fibrillation  - with hx RVR.  Per primary team  5. Leukocytosis- resolved.  6. Hyponatremia - resolved with HD and mild  7. HTN - Started amlodipine 2.5 mg daily for now. No home meds listed for HTN.   Subjective:   UOP appears to be dropping but she doesn't have a foley in place and renal function is improving. She has a decent appetite, denies n/v/cp, but has very minimal dyspnea. She was able to walk around the entire ward today.   Objective:   BP (!) 171/72 (BP Location: Left Arm)   Pulse 95   Temp 99.3 F (37.4 C) (Oral)   Resp 18   Ht 5' 0.98" (1.549 m)   Wt 84 kg   SpO2 96%   BMI 35.01 kg/m   Intake/Output Summary (Last 24 hours) at 01/02/2020 1306 Last data filed at 01/02/2020 1207 Gross per 24 hour  Intake 742.67 ml  Output 900 ml  Net -157.33 ml   Weight change:   Physical Exam: Gen: adult  female NAD CVS: S1S2 no rub Resp: cta unlabored and on room air  Abd: soft, nontender; obese habitus Ext: no pitting edema Neuro alert and oriented x3 ; provides hx and follows commands  Psych normal mood and affect Access left chest tunneled catheter    Imaging: No results found.  Labs: BMET Recent Labs  Lab 12/27/19 0405 12/28/19 0420 12/29/19 0210 12/30/19 1418 12/31/19 0716 01/01/20 0334 01/02/20 0340  NA 130* 128* 135 133* 132* 132* 135  K 3.6 3.4* 3.7 3.7 3.4* 3.2* 4.0  CL 92* 92* 98 97* 96* 97* 101  CO2 23 22 27 24 23 23 22   GLUCOSE 130* 137* 154* 175* 143* 137* 145*  BUN 34* 40* 18 33* 37* 41* 40*  CREATININE 5.29* 6.02* 3.86* 4.53* 4.68* 4.41* 3.84*  CALCIUM 8.0* 8.1* 8.1* 8.3* 8.5* 8.5* 8.6*  PHOS 5.9* 6.0* 3.6 4.5 5.2* 4.6 4.3   CBC Recent Labs  Lab 12/30/19 1418 12/30/19 1418 12/31/19 0716 01/01/20 0334 01/01/20 1849 01/02/20 0340  WBC 9.8  --  8.0 7.1  --  8.1  NEUTROABS 8.1*  --  6.4 5.6  --  6.6  HGB 8.3*   < > 8.3* 7.7* 9.5* 9.4*  HCT 25.8*   < > 26.1*  23.8* 28.7* 28.7*  MCV 87.2  --  89.1 86.5  --  85.9  PLT 239  --  228 213  --  203   < > = values in this interval not displayed.    Medications:    . amiodarone  200 mg Oral Daily  . amLODipine  2.5 mg Oral Daily  . Chlorhexidine Gluconate Cloth  6 each Topical Daily  . Chlorhexidine Gluconate Cloth  6 each Topical Q0600  . [START ON 01/03/2020] darbepoetin (ARANESP) injection - NON-DIALYSIS  60 mcg Subcutaneous Once  . feeding supplement (NEPRO CARB STEADY)  237 mL Oral TID BM  . fluticasone  1 spray Each Nare Daily  . insulin aspart  0-5 Units Subcutaneous QHS  . insulin aspart  0-9 Units Subcutaneous TID WC  . insulin glargine  14 Units Subcutaneous Daily  . multivitamin  1 tablet Oral QHS  . pantoprazole  40 mg Oral BID AC  . sodium chloride flush  10-40 mL Intracatheter Q12H  . sucralfate  1 g Oral Q6H      Otelia Santee, MD 01/02/2020, 1:06 PM

## 2020-01-03 ENCOUNTER — Inpatient Hospital Stay (HOSPITAL_COMMUNITY): Payer: Medicare Other

## 2020-01-03 HISTORY — PX: IR REMOVAL TUN CV CATH W/O FL: IMG2289

## 2020-01-03 LAB — CBC WITH DIFFERENTIAL/PLATELET
Abs Immature Granulocytes: 0.06 10*3/uL (ref 0.00–0.07)
Basophils Absolute: 0 10*3/uL (ref 0.0–0.1)
Basophils Relative: 1 %
Eosinophils Absolute: 0 10*3/uL (ref 0.0–0.5)
Eosinophils Relative: 0 %
HCT: 29.4 % — ABNORMAL LOW (ref 36.0–46.0)
Hemoglobin: 9.5 g/dL — ABNORMAL LOW (ref 12.0–15.0)
Immature Granulocytes: 1 %
Lymphocytes Relative: 9 %
Lymphs Abs: 0.7 10*3/uL (ref 0.7–4.0)
MCH: 27.6 pg (ref 26.0–34.0)
MCHC: 32.3 g/dL (ref 30.0–36.0)
MCV: 85.5 fL (ref 80.0–100.0)
Monocytes Absolute: 0.6 10*3/uL (ref 0.1–1.0)
Monocytes Relative: 7 %
Neutro Abs: 6.9 10*3/uL (ref 1.7–7.7)
Neutrophils Relative %: 82 %
Platelets: 182 10*3/uL (ref 150–400)
RBC: 3.44 MIL/uL — ABNORMAL LOW (ref 3.87–5.11)
RDW: 15.5 % (ref 11.5–15.5)
WBC: 8.2 10*3/uL (ref 4.0–10.5)
nRBC: 0 % (ref 0.0–0.2)

## 2020-01-03 LAB — COMPREHENSIVE METABOLIC PANEL
ALT: 10 U/L (ref 0–44)
AST: 18 U/L (ref 15–41)
Albumin: 2.6 g/dL — ABNORMAL LOW (ref 3.5–5.0)
Alkaline Phosphatase: 108 U/L (ref 38–126)
Anion gap: 13 (ref 5–15)
BUN: 40 mg/dL — ABNORMAL HIGH (ref 8–23)
CO2: 23 mmol/L (ref 22–32)
Calcium: 8.8 mg/dL — ABNORMAL LOW (ref 8.9–10.3)
Chloride: 101 mmol/L (ref 98–111)
Creatinine, Ser: 2.94 mg/dL — ABNORMAL HIGH (ref 0.44–1.00)
GFR calc Af Amer: 17 mL/min — ABNORMAL LOW (ref >60)
GFR calc non Af Amer: 15 mL/min — ABNORMAL LOW (ref >60)
Glucose, Bld: 162 mg/dL — ABNORMAL HIGH (ref 70–99)
Potassium: 3.3 mmol/L — ABNORMAL LOW (ref 3.5–5.1)
Sodium: 137 mmol/L (ref 135–145)
Total Bilirubin: 0.8 mg/dL (ref 0.3–1.2)
Total Protein: 5.9 g/dL — ABNORMAL LOW (ref 6.5–8.1)

## 2020-01-03 LAB — GLUCOSE, CAPILLARY
Glucose-Capillary: 154 mg/dL — ABNORMAL HIGH (ref 70–99)
Glucose-Capillary: 147 mg/dL — ABNORMAL HIGH (ref 70–99)
Glucose-Capillary: 156 mg/dL — ABNORMAL HIGH (ref 70–99)

## 2020-01-03 LAB — MAGNESIUM: Magnesium: 1.5 mg/dL — ABNORMAL LOW (ref 1.7–2.4)

## 2020-01-03 LAB — PHOSPHORUS: Phosphorus: 3.8 mg/dL (ref 2.5–4.6)

## 2020-01-03 MED ORDER — HYDROCODONE-ACETAMINOPHEN 5-325 MG PO TABS: 1.0000 | ORAL_TABLET | ORAL | 0 refills | Status: DC | PRN

## 2020-01-03 MED ORDER — POTASSIUM CHLORIDE CRYS ER 20 MEQ PO TBCR
40.0000 meq | EXTENDED_RELEASE_TABLET | Freq: Two times a day (BID) | ORAL | Status: DC
  Administered 2020-01-03: 40 meq via ORAL
  Filled 2020-01-03: qty 2

## 2020-01-03 MED ORDER — ONDANSETRON HCL 4 MG PO TABS: 4.0000 mg | ORAL_TABLET | Freq: Four times a day (QID) | ORAL | 0 refills | Status: DC | PRN

## 2020-01-03 MED ORDER — LOPERAMIDE HCL 2 MG PO CAPS: 2.0000 mg | ORAL_CAPSULE | ORAL | 0 refills | Status: DC | PRN

## 2020-01-03 MED ORDER — LIDOCAINE HCL 1 % IJ SOLN
INTRAMUSCULAR | Status: AC
  Filled 2020-01-03: qty 20

## 2020-01-03 MED ORDER — AMLODIPINE BESYLATE 2.5 MG PO TABS: 2.5000 mg | ORAL_TABLET | Freq: Every day | ORAL | 0 refills | Status: DC

## 2020-01-03 MED ORDER — MAGNESIUM SULFATE 2 GM/50ML IV SOLN
2.0000 g | Freq: Once | INTRAVENOUS | Status: AC
  Administered 2020-01-03: 2 g via INTRAVENOUS
  Filled 2020-01-03: qty 50

## 2020-01-03 MED ORDER — PANTOPRAZOLE SODIUM 40 MG PO TBEC: 40.0000 mg | DELAYED_RELEASE_TABLET | Freq: Two times a day (BID) | ORAL | 0 refills | Status: DC

## 2020-01-03 MED ORDER — AMIODARONE HCL 200 MG PO TABS: 200.0000 mg | ORAL_TABLET | Freq: Every day | ORAL | 0 refills | Status: DC

## 2020-01-03 MED ORDER — FLUTICASONE PROPIONATE 50 MCG/ACT NA SUSP: 1.0000 | Freq: Every day | NASAL | 2 refills | Status: DC

## 2020-01-03 MED ORDER — SUCRALFATE 1 GM/10ML PO SUSP: 1.0000 g | Freq: Four times a day (QID) | ORAL | 0 refills | Status: DC

## 2020-01-03 MED ORDER — CHLORHEXIDINE GLUCONATE 4 % EX LIQD
CUTANEOUS | Status: AC
  Filled 2020-01-03: qty 15

## 2020-01-03 MED ORDER — NEPRO/CARBSTEADY PO LIQD: 237.0000 mL | Freq: Three times a day (TID) | ORAL | 0 refills | Status: DC

## 2020-01-03 MED ORDER — RENA-VITE PO TABS: 1.0000 | ORAL_TABLET | Freq: Every day | ORAL | 0 refills | Status: DC

## 2020-01-03 NOTE — Consult Note (Signed)
   [facility] CM Inpatient Consult   01/03/2020  Katherine Oliver [facility] [birth year] [id number]   Follow up:  Patient for transitioning home with home health recommended by PT and OT.  Patient has been assigned to [facility] and [facility] Social Worker for post hospital follow up needs. For details review Lorraine's notes 12/28/19.  For questions, please contact:  Natividad Brood, RN BSN [facility] Liaison Toll free office [phone]  [email or web address] number: [phone] [name]

## 2020-01-03 NOTE — Discharge Summary (Signed)
Physician Discharge Summary  Katherine Oliver BSJ:[id number] DOB: [birth year] DOA: 12/13/2019  PCP: [name], MD  Admit date: 12/13/2019 Discharge date: 01/03/2020  Admitted From: Home Disposition: Home with home health  Recommendations for Outpatient Follow-up:  1. Follow up with PCP in 1-2 weeks 2. Follow-up with nephrology in 1 to 2 weeks 3. Follow-up with cardiology within 1 to 2 weeks and discuss about continuing amiodarone 4. Follow-up with gastroenterology in 2 to 4 weeks to discuss biliary stent and repeat ERCP 5. Follow-up as outpatient for work-up for her neck swelling on the right 6. Please obtain CBCs, CMP, mag, Phos in one week 7. Please follow up on the following pending results:  Home Health: Yes Equipment/Devices: None recommended by PT  Discharge Condition: Stable CODE STATUS: Full code Diet recommendation: Heart healthy carb modified diet  Brief/Interim Summary: Patient is a 74 year old obese Caucasian female with a past medical significant for but not limited to gallstone pancreatitis and UTI.  She had an ERCP on 2/10 with stent placement and PCCM was consulted to admit worsening pancreatic pain, increased tachycardia and increased work of breathing supplemental oxygen use.  She had a worsening AKI.  General surgery was consulted due to increased intrabladder pressure and concern for possible abdominal compartment syndrome.  On 12/16/2019 when she went into A. fib with RVR and started on diltiazem and later became hypotensive and switched to amiodarone.  Patient also had an EGD done on 12/19/2019 due to possible GI bleed and EGD showed stress ulcerations.  Due to some improvement in her overall clinical status tried hospitalist assumed care on 12/21/2019.  Because her renal function has not improved significantly for her CKD she is started on hemodialysis and had a tunneled catheter placed.  Nephrology has now placed in order to remove her nontunneled catheter and  they want to continue to follow for renal recovery though they have contacted the social worker for hemodialysis regarding outpatient unit should one be needed and this is in the process of being set up.  Nephrology still wants to continue to monitor closely as her labs did not worsen that much compared to yesterday.  Dr. Royce Macadamia has now removed her Foley catheter and nephrology is actively monitoring for renal recovery and because of this she needs to remain inpatient.  01/01/20 Today her renal function looks slightly better than the day before.  Nephrology is transfusing 1 unit PRBCs to optimize anemia for renal recovery and recommends holding hemodialysis again today and nephrology feels that she will not appear to need outpatient hemodialysis at this time.  01/02/20 Renal function improved a little bit more today.  Nephrology still to evaluate.  Patient states that her diarrhea is improved.  Continues to have her tunneled dialysis catheter but may be removed in the next few days by nephrology.  Will defer to nephrology when to safely discharge.  Today the patient felt improved her diarrhea is improved and she feels well.  Labs showed that her renal function is trending down appropriately.  IR was consulted for her tunnel catheter remover and she is deemed stable for discharge by nephrology and will be discharged home. prior to discharge her electrolytes were repleted.  She is deemed stable need to follow-up with PCP, nephrology as well as gastroenterology due to her GI bleeding.  Of note the patient is diabetic and takes Metformin at home and I told her to hold Metformin until her renal function recovers.  She is will be discharged on insulin however  her hemoglobin A1c is 6.4 and can be managed in outpatient setting with diet control and close follow-up with PCP so will not interact with any insulin.  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,  without long-term current use of insulin (HCC)   HTN (hypertension)   Dyslipidemia associated with type 2 diabetes mellitus (HCC)   NAFLD (nonalcoholic fatty liver disease)   GERD (gastroesophageal reflux disease)   Polycythemia   Obesity, Class I, BMI 30-34.9   CKD stage 3 due to type 2 diabetes mellitus (HCC)   Acute gallstone pancreatitis   Sepsis ([place])   Acute renal failure (ARF) (HCC)   Acute respiratory failure (HCC)   Choledocholithiasis  Gallstone pancreatitis post ERCP with biliary stent placement, improved Acute Hepatitis, improved  -LFTs improved and today her AST was 18 and her ALT was 10 -GI on board, continue supportive care and advance diet as tolerated -Continue PPI, Carafate at discharge -On discharge she will need to follow-up with GI to address her stent and repeat ERCP in the coming weeks  Acute blood loss anemia/GI bleeding2/2 gastric/stress ulcers -Status post 4 units of PRBC, last transfusion of 1U of PRBC on 01/01/20 -GI on board, continue supportive care and advance diet as tolerated -Continue PPI, Carafate -Patient was given Aranesp 40 mcg once on 2/23 -Continue to Monitor Blood Count; has been stable after blood transfusion and is now 9.5/29.4 -Continue to monitor for signs and symptoms of bleeding; currently no overt signs and symptoms of bleeding -Repeat CBC within 1 week  AKI with oliguria/anuric/hyponatremia -In the setting of sepsis and severe acute pancreatitis -Status post CRRT, started on 2/11, stopped on 2/14 -Currently on HD (M/W/F) and likely will get dialyzed tomorrow per nephrology and her dialysis session today was held as renal wants to see her function tomorrow -Nephrology on board -S/PTDC on 12/27/19 by IR -BUN/Cr went from 18/3.86 -> 33/4.53 -> 37/4.68 -> 41/4.41 and is slightly improved today again and is now 40/2.94 -Na+ is now 137 after being well -Further Care Per Nephrology -Outpatient HD referral made by the nephrology social  worker just in case this patient would require this but nephrology still wants to continue to monitor to see if her renal function improves the next couple days and nephrology will continue to follow for renal recovery and nephrology wants to evaluate her renal function again tomorrow; per the nephrology team it appears the patient would likely not need her outpatient dialysis  and Dr. Augustin Coupe recommended removal of her tunneled dialysis catheter and discharge home  Leukocytosis, improved -Remains afebrile, hemodynamically stable -No obvious signs of infection; her WBC is now 8.2 -Continue to monitor off antibiotics -C/w CBC  New onsetA. fib with RVR, now rate controlled -Possibly likely related to sepsis -Currently rate controlled -Currently on oral Amiodarone 200 mg po Daily will continue at discharge and have her follow-up with cardiology -Not anticoagulation candidate due to her recent ulcerations and bleeding can discuss this with cardiology in outpatient setting  Thrombocytopenia -Resolved -Monitor closely for any signs of bleeding -Platelet Count is now slowly trending down and went from 228 is now 182 -Continue to Monitor and Repeat CBC in 1 week  Acute hypoxemic and hypercarbic respiratory failure -Chest x-ray showed atelectasis -She was on supplemental oxygen but now weaned off -SpO2: 94 % O2 Flow Rate (L/min): 2 L/min -Continue incentive spirometry -Repeat CXR in AM if felt necessary but she is nonsymptomatic  Enterococcus UTI -S/p  Completed antibiotics  Diabetes Mellitus Type 2 -While she was hospitalized C/w SSI, Lantus, Accu-Cheks, hypoglycemic protocol -CBG's ranging from 139-156 -Takes Metformin at home which I told her to hold We will not discharge her on any insulin currently and will have her follow-up with PCP carefully given that her hemoglobin A1c is 6.4   Obesity -Estimated body mass index is 35.01 kg/m as calculated from the following:   Height as  of this encounter: 5' 0.98" (1.549 m).   Weight as of this encounter: 84 kg. -Weight Loss and Dietary Counseling given   Physical Deconditioning -PT/OT recommending Home Health -Out of bed as tolerated  Hypomagnesemia -Patient magnesium level was 1.5 and was repleted prior to discharge today -Continue monitor and replete as necessary -Repeat magnesium level within 1 week  Hypokalemia -The patient's potassium was 3.3 and was replete prior to discharge today -Continue to monitor and replete as necessary -Repeat CMP within 1 week  Diarrhea, improved and resolved -Hold her stool softeners -Improved with Imodium and now patient states that she is having more formed bowel movements -Continue monitor closely and I do not clinically suspect that this is infectious diarrhea she is afebrile and has no leukocytosis -If continues to worsen will need at least a GI pathogen panel  Neck Swelling -On the right side where her prior temporary dialysis catheter was -Not painful and is not tender to palpate -Patient states she has had this for years  -Outpatient work-up with a head and neck CT as patient states this is been going on for years.  Discharge Instructions  Discharge Instructions    AMB Referral to [facility]   Complete by: As directed    74 y/o female admitted on 2/9 with gallstone pancreatitis and UTI. Had ERCP on 2/10 with stent placement (technically difficult and complex), followed by worsening pancreatic pain, increased tachycardia, increased work of breathing and supplemental oxygen use, worsening AKI, PCCM consulted.   General surgery also consulted due to increased intrabladder pressure and concern for possible abdominal compartment syndrome. On 2/12 patient went into A. fib with RVR (new), started on diltiazem, later became hypotensive and switched to amiodarone. Patient also had an EGD done on 2/15 due to possible GI bleed. EGD showed stress ulceration.    Patient states living with husband (aging) who assists/ supports with needs.  Patient had mentioned that husband is looking into their long-term care insurance to get coverage for personal care services [facility]) that can assist with cleaning and cooking at home.  Patient endorses her primary care provider as Dr. [name] with [facility], office providing transition of care follow-up.  Verbal consent given by patientfor follow-up calls for[facility] CM servicespost hospital discharge.    NOTE: Please send a consent form to patient in order to obtain written consent for [facility] services.    Reason for consult: Referral to: 1. [facility] RNcaremanagementcoordinator for complexcare managementneeds related to chronic health conditions and new onset of A-fib with RVR;  and 2.[place] social worker for community resources and assistance with Advance Directives;   Diagnoses of:  Diabetes Other     Other Diagnosis: new onset Afib with RVR   Expected date of contact: 1-3 days (reserved for hospital discharges)   Call MD for:  difficulty breathing, headache or visual disturbances   Complete by: As directed    Call MD for:  extreme fatigue   Complete by: As directed    Call MD for:  hives   Complete by:  As directed    Call MD for:  persistant dizziness or light-headedness   Complete by: As directed    Call MD for:  persistant nausea and vomiting   Complete by: As directed    Call MD for:  redness, tenderness, or signs of infection (pain, swelling, redness, odor or green/yellow discharge around incision site)   Complete by: As directed    Call MD for:  severe uncontrolled pain   Complete by: As directed    Call MD for:  temperature >100.4   Complete by: As directed    Diet - low sodium heart healthy   Complete by: As directed    Diet Carb Modified   Complete by: As directed    Discharge instructions   Complete by: As directed    You were cared for by a hospitalist during your  hospital stay. If you have any questions about your discharge medications or the care you received while you were in the hospital after you are discharged, you can call the unit and ask to speak with the hospitalist on call if the hospitalist that took care of you is not available. Once you are discharged, your primary care physician will handle any further medical issues. Please note that NO REFILLS for any discharge medications will be authorized once you are discharged, as it is imperative that you return to your primary care physician (or establish a relationship with a primary care physician if you do not have one) for your aftercare needs so that they can reassess your need for medications and monitor your lab values.  Follow up with PCP, Gastroenterology, and Nephrology. Take all medications as prescribed and have PCP further adjust Blood Sugar Regimen and Insulin.. If symptoms change or worsen please return to the ED for evaluation   Increase activity slowly   Complete by: As directed      Allergies as of 01/03/2020      Reactions   Azithromycin Itching   Okay if takes benadryl along with it   Nickel    Reaction to cheap earrings   Sulfa Antibiotics    Adhesive [tape] Rash   Paper tape - blisters      Medication List    STOP taking these medications   pantoprazole 40 MG injection Commonly known as: PROTONIX Replaced by: pantoprazole 40 MG tablet     TAKE these medications   amiodarone 200 MG tablet Commonly known as: PACERONE Take 1 tablet (200 mg total) by mouth daily. Start taking on: January 04, 2020   amLODipine 2.5 MG tablet Commonly known as: NORVASC Take 1 tablet (2.5 mg total) by mouth daily. Start taking on: January 04, 2020   feeding supplement (NEPRO CARB STEADY) Liqd Take 237 mLs by mouth 3 (three) times daily between meals.   fluticasone 50 MCG/ACT nasal spray Commonly known as: FLONASE Place 1 spray into both nostrils daily. Start taking on: January 04, 2020    HYDROcodone-acetaminophen 5-325 MG tablet Commonly known as: NORCO/VICODIN Take 1-2 tablets by mouth every 4 (four) hours as needed for moderate pain.   insulin aspart 100 UNIT/ML injection Commonly known as: novoLOG Inject 0-6 Units into the skin every 4 (four) hours.   loperamide 2 MG capsule Commonly known as: IMODIUM Take 1 capsule (2 mg total) by mouth as needed for diarrhea or loose stools.   multivitamin Tabs tablet Take 1 tablet by mouth at bedtime.   ondansetron 4 MG tablet Commonly known as: ZOFRAN Take 1 tablet (4  mg total) by mouth every 6 (six) hours as needed for nausea.   pantoprazole 40 MG tablet Commonly known as: PROTONIX Take 1 tablet (40 mg total) by mouth 2 (two) times daily before a meal. Replaces: pantoprazole 40 MG injection   sucralfate 1 GM/10ML suspension Commonly known as: CARAFATE Take 10 mLs (1 g total) by mouth every 6 (six) hours.            Durable Medical Equipment  (From admission, onward)         Start     Ordered   12/26/19 1358  For home use only DME 3 n 1  Once     12/26/19 1357         Follow-up Information    [name] Follow up.   Why: home health PT  Contact information: [address]  [place] [address] [phone]         Allergies  Allergen Reactions  . Azithromycin Itching    Okay if takes benadryl along with it  . Nickel     Reaction to cheap earrings  . Sulfa Antibiotics   . Adhesive [Tape] Rash    Paper tape - blisters   Consultations:  Gastroenterology  Nephrology  Procedures/Studies: DG Abd 1 View  Result Date: 12/14/2019 CLINICAL DATA:  74 year old with acute pancreatitis and acute onset of abdominal distention. EXAM: ABDOMEN - 1 VIEW COMPARISON:  CT abdomen and pelvis yesterday. FINDINGS: Since the CT yesterday, development of borderline to mild gaseous distension of small bowel throughout the abdomen. Gas within normal caliber ascending colon.  Remainder of the colon decompressed as on yesterday's CT. Residual contrast material in the renal parenchyma bilaterally and in the urinary bladder related to yesterday's CT, query acute kidney injury. No suggestion of free air on the supine image. Surgical clips in the RIGHT UPPER QUADRANT from prior cholecystectomy. IMPRESSION: 1. Mild generalized ileus. No evidence of bowel obstruction currently. 2. Residual contrast material in the renal parenchyma bilaterally and in the urinary bladder related to yesterday's CT, query acute kidney injury/renal insufficiency. Electronically Signed   By: Evangeline Dakin M.D.   On: 12/14/2019 09:37   CT Angio Chest PE W and/or Wo Contrast  Result Date: 12/13/2019 CLINICAL DATA:  74 year old female with lower abdominal pain. Shortness of breath. EXAM: CT ANGIOGRAPHY CHEST CT ABDOMEN AND PELVIS WITH CONTRAST TECHNIQUE: Multidetector CT imaging of the chest was performed using the standard protocol during bolus administration of intravenous contrast. Multiplanar CT image reconstructions and MIPs were obtained to evaluate the vascular anatomy. Multidetector CT imaging of the abdomen and pelvis was performed using the standard protocol during bolus administration of intravenous contrast. CONTRAST:  48mL OMNIPAQUE IOHEXOL 350 MG/ML SOLN COMPARISON:  Chest CT 01/11/2016 and CT abdomen 12/02/2013 FINDINGS: CTA CHEST FINDINGS Cardiovascular: Negative for pulmonary embolism. Normal caliber of the thoracic aorta. Heart size is within normal limits. No significant pericardial fluid. Great vessels patent. Mediastinum/Nodes: Normal appearance of mediastinal structures. No significant node enlargement in the mediastinum or hilar regions. There are small right axillary lymph nodes. However, these right axillary lymph nodes have clearly enlarged from the exam in 2017. Index lymph node measures up to 8 mm on sequence 1, image 21 and this lymph node was barely perceptible on the prior  examination. Mild lymphadenopathy in the right axilla is nonspecific. No significant left axillary lymph node enlargement. Lungs/Pleura: Trachea and mainstem bronchi are patent. Dependent atelectasis in both lower lobes. Again noted are  small scattered pulmonary nodules. Index lesion in the right middle lobe on sequence 4, image 46 measures 4 mm and stable. There are additional small scattered pulmonary nodules predominantly along the pleural surfaces. Majority of these small pulmonary nodules are stable. Limited evaluation of the lung bases due to the atelectasis. No large pleural effusions. Question a new 2 mm nodule in the periphery of the right lower lobe on sequence 4, image 51. Musculoskeletal: No acute bone abnormality. Review of the MIP images confirms the above findings. CT ABDOMEN and PELVIS FINDINGS Hepatobiliary: Diffuse low-attenuation of the liver is compatible with steatosis. Gallbladder has been removed. Subtle low-density along periphery of the posterior right hepatic lobe on sequence 4, image 20 is nonspecific. There is mild intrahepatic biliary dilatation. Mild dilatation of the common bile duct measuring up to 9 mm on sequence 4, image 38. Large amount of edema and stranding in the porta hepatis. There is a calcification near the distal common bile duct and this is compatible with an obstructing bile duct stone. Pancreas: Extensive edema and inflammatory changes in the upper abdomen centered around the pancreas. Pancreatic enhancement is heterogeneous and compatible with edema and possibly areas of pancreatic necrosis. No discrete fluid or pseudocyst formations. No significant pancreatic duct dilatation. Spleen: Normal in size without focal abnormality. Adrenals/Urinary Tract: Normal adrenal glands. Stable appearance of the right kidney. Tiny hypodensities along the right kidney lower pole are suggestive for small cysts. Right extrarenal pelvis is unchanged without right hydronephrosis. Again  noted is a exophytic cyst in the left kidney lower pole. There is a new area of decreased attenuation along the posterior left kidney lower pole region on sequence 4, image 50. The central aspect of the low-density area is hyperdense and may be enhancing. Based on the sagittal reformats, this could represent a renal infarct or focal pyelonephritis. Difficult to exclude an atypical lesion at this location. Negative for left hydronephrosis. Normal appearance of the urinary bladder. Indeterminate low-density structure in the medial left kidney on sequence 9 image 27 measures up to 1.0 cm. Stomach/Bowel: Diverticulosis in the sigmoid colon without colonic inflammation. Normal appearance stomach. No evidence for small or large bowel struck shin. Calcification near the distal common bile duct is likely at the ampulla. 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 Main visceral arteries are patent. No abdominopelvic lymphadenopathy. Portal venous system is patent. Splenic vein is patent. Reproductive: Hysterectomy. New or enlarged low-density structure involving the right ovary on sequence 4, image 71. No evidence for a left adnexal lesion. Other: Large amount of edema in the central abdominal mesentery and around the duodenum and porta hepatis. No ascites in pelvis. Negative free air. Musculoskeletal: Focal sclerosis in the left ilium on sequence 4, image 69 is stable. No acute bone abnormality. Review of the MIP images confirms the above findings. IMPRESSION: 1. Acute pancreatitis likely secondary to an obstructive stone in the distal common bile duct near the ampulla. 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 No evidence for pseudocyst formations at time. Mild intrahepatic and extrahepatic biliary dilatation. Subtle low-density along the posterior right hepatic lobe may be related to inflammation but this area is indeterminate. 2. Indeterminate left  renal lesions. Unusual lesion or hypodensities in the posterior left kidney lower pole could represent areas of infarct, focal pyelonephritis or atypical neoplastic lesion. There is also a new indeterminate 1 cm lesion in the medial left kidney. Recommend follow-up renal MRI when patient is stable and can not tolerate an  abdominal MRI. 3. Negative for pulmonary embolism.  Volume loss at the lung bases. 4. Chronic small pulmonary nodules. Index pulmonary nodule in right middle lobe has not significantly changed since 2017. 5. **An incidental finding of potential clinical significance has been found. Mildly enlarged right axillary lymph nodes of uncertain etiology. These lymph nodes are small but have enlarged since 2017.** 6. **An incidental finding of potential clinical significance has been found. 2.5 cm low-density structure in the right ovary. This could represent an ovarian or adnexal cyst but atypical for a patient of this age. Recommend follow-up pelvic ultrasound.** These results were called by telephone at the time of interpretation on 12/13/2019 at 4:25 pm to provider [facility] , who verbally acknowledged these results. Electronically Signed   By: Markus Daft M.D.   On: 12/13/2019 16:35   CT ABDOMEN PELVIS W CONTRAST  Result Date: 12/13/2019 CLINICAL DATA:  74 year old female with lower abdominal pain. Shortness of breath. EXAM: CT ANGIOGRAPHY CHEST CT ABDOMEN AND PELVIS WITH CONTRAST TECHNIQUE: Multidetector CT imaging of the chest was performed using the standard protocol during bolus administration of intravenous contrast. Multiplanar CT image reconstructions and MIPs were obtained to evaluate the vascular anatomy. Multidetector CT imaging of the abdomen and pelvis was performed using the standard protocol during bolus administration of intravenous contrast. CONTRAST:  39mL OMNIPAQUE IOHEXOL 350 MG/ML SOLN COMPARISON:  Chest CT 01/11/2016 and CT abdomen 12/02/2013 FINDINGS: CTA CHEST FINDINGS  Cardiovascular: Negative for pulmonary embolism. Normal caliber of the thoracic aorta. Heart size is within normal limits. No significant pericardial fluid. Great vessels patent. Mediastinum/Nodes: Normal appearance of mediastinal structures. No significant node enlargement in the mediastinum or hilar regions. There are small right axillary lymph nodes. However, these right axillary lymph nodes have clearly enlarged from the exam in 2017. Index lymph node measures up to 8 mm on sequence 1, image 21 and this lymph node was barely perceptible on the prior examination. Mild lymphadenopathy in the right axilla is nonspecific. No significant left axillary lymph node enlargement. Lungs/Pleura: Trachea and mainstem bronchi are patent. Dependent atelectasis in both lower lobes. Again noted are small scattered pulmonary nodules. Index lesion in the right middle lobe on sequence 4, image 46 measures 4 mm and stable. There are additional small scattered pulmonary nodules predominantly along the pleural surfaces. Majority of these small pulmonary nodules are stable. Limited evaluation of the lung bases due to the atelectasis. No large pleural effusions. Question a new 2 mm nodule in the periphery of the right lower lobe on sequence 4, image 51. Musculoskeletal: No acute bone abnormality. Review of the MIP images confirms the above findings. CT ABDOMEN and PELVIS FINDINGS Hepatobiliary: Diffuse low-attenuation of the liver is compatible with steatosis. Gallbladder has been removed. Subtle low-density along periphery of the posterior right hepatic lobe on sequence 4, image 20 is nonspecific. There is mild intrahepatic biliary dilatation. Mild dilatation of the common bile duct measuring up to 9 mm on sequence 4, image 38. Large amount of edema and stranding in the porta hepatis. There is a calcification near the distal common bile duct and this is compatible with an obstructing bile duct stone. Pancreas: Extensive edema and  inflammatory changes in the upper abdomen centered around the pancreas. Pancreatic enhancement is heterogeneous and compatible with edema and possibly areas of pancreatic necrosis. No discrete fluid or pseudocyst formations. No significant pancreatic duct dilatation. Spleen: Normal in size without focal abnormality. Adrenals/Urinary Tract: Normal adrenal glands. Stable appearance of the right kidney. Tiny  hypodensities along the right kidney lower pole are suggestive for small cysts. Right extrarenal pelvis is unchanged without right hydronephrosis. Again noted is a exophytic cyst in the left kidney lower pole. There is a new area of decreased attenuation along the posterior left kidney lower pole region on sequence 4, image 50. The central aspect of the low-density area is hyperdense and may be enhancing. Based on the sagittal reformats, this could represent a renal infarct or focal pyelonephritis. Difficult to exclude an atypical lesion at this location. Negative for left hydronephrosis. Normal appearance of the urinary bladder. Indeterminate low-density structure in the medial left kidney on sequence 9 image 27 measures up to 1.0 cm. Stomach/Bowel: Diverticulosis in the sigmoid colon without colonic inflammation. Normal appearance stomach. No evidence for small or large bowel struck shin. Calcification near the distal common bile duct is likely at the ampulla. 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 Main visceral arteries are patent. No abdominopelvic lymphadenopathy. Portal venous system is patent. Splenic vein is patent. Reproductive: Hysterectomy. New or enlarged low-density structure involving the right ovary on sequence 4, image 71. No evidence for a left adnexal lesion. Other: Large amount of edema in the central abdominal mesentery and around the duodenum and porta hepatis. No ascites in pelvis. Negative free air. Musculoskeletal: Focal sclerosis in the left ilium  on sequence 4, image 69 is stable. No acute bone abnormality. Review of the MIP images confirms the above findings. IMPRESSION: 1. Acute pancreatitis likely secondary to an obstructive stone in the distal common bile duct near the ampulla. 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 No evidence for pseudocyst formations at time. Mild intrahepatic and extrahepatic biliary dilatation. Subtle low-density along the posterior right hepatic lobe may be related to inflammation but this area is indeterminate. 2. Indeterminate left renal lesions. Unusual lesion or hypodensities in the posterior left kidney lower pole could represent areas of infarct, focal pyelonephritis or atypical neoplastic lesion. There is also a new indeterminate 1 cm lesion in the medial left kidney. Recommend follow-up renal MRI when patient is stable and can not tolerate an abdominal MRI. 3. Negative for pulmonary embolism.  Volume loss at the lung bases. 4. Chronic small pulmonary nodules. Index pulmonary nodule in right middle lobe has not significantly changed since 2017. 5. **An incidental finding of potential clinical significance has been found. Mildly enlarged right axillary lymph nodes of uncertain etiology. These lymph nodes are small but have enlarged since 2017.** 6. **An incidental finding of potential clinical significance has been found. 2.5 cm low-density structure in the right ovary. This could represent an ovarian or adnexal cyst but atypical for a patient of this age. Recommend follow-up pelvic ultrasound.** These results were called by telephone at the time of interpretation on 12/13/2019 at 4:25 pm to provider [facility] , who verbally acknowledged these results. Electronically Signed   By: Markus Daft M.D.   On: 12/13/2019 16:35   US RENAL  Result Date: 12/15/2019 CLINICAL DATA:  Acute kidney injury. EXAM: RENAL / URINARY TRACT ULTRASOUND COMPLETE COMPARISON:  Abdominal CT 2 days prior 12/13/2019  FINDINGS: Right Kidney: Renal measurements: 11.7 x 5.6 x 5.4 cm = volume: 186 mL. Echogenicity within normal limits. No mass or hydronephrosis visualized. Tiny hypodensity in the lower right kidney is not well visualized sonographically. Left Kidney: Renal measurements: 12.7 x 5.0 x 4.8 cm = volume: 157 mL. Echogenicity within normal limits. No solid mass or hydronephrosis visualized. Cyst measuring 1.5 x 1.6  x 1.2 cm in the lower kidney. The additional low-density lesion on prior CT is not well visualized. Bladder: Decompressed by Foley catheter. Other: Hepatic steatosis incidentally noted. IMPRESSION: 1. No hydronephrosis or obstructive uropathy. 2. Small cyst in the lower left kidney. Additional indeterminate left renal lesions on CT are not well visualized sonographically. As recommended on prior CT, elective follow-up renal protocol MRI is recommended when patient is able and can tolerate breath hold technique. Electronically Signed   By: Keith Rake M.D.   On: 12/15/2019 15:01   IR Fluoro Guide CV Line Right  Result Date: 12/27/2019 CLINICAL DATA:  Renal failure and need for tunneled hemodialysis catheter. Current right jugular temporary non tunneled dialysis catheter in place. This is needed currently for IV access and conscious sedation for the tunnel catheter placement. EXAM: TUNNELED CENTRAL VENOUS HEMODIALYSIS CATHETER PLACEMENT WITH ULTRASOUND AND FLUOROSCOPIC GUIDANCE ANESTHESIA/SEDATION: 1.0 mg IV Versed; 50 mcg IV Fentanyl. Total Moderate Sedation Time:   23 minutes. The patient's level of consciousness and physiologic status were continuously monitored during the procedure by Radiology nursing. MEDICATIONS: 2 g IV Ancef. FLUOROSCOPY TIME:  30 seconds.  3.0 mGy. PROCEDURE: The procedure, risks, benefits, and alternatives were explained to the patient. Questions regarding the procedure were encouraged and answered. The patient understands and consents to the procedure. A timeout was performed  prior to initiating the procedure. Ultrasound was used to confirm patency of the left internal jugular vein. The left neck and chest were prepped with chlorhexidine in a sterile fashion, and a sterile drape was applied covering the operative field. Maximum barrier sterile technique with sterile gowns and gloves were used for the procedure. Local anesthesia was provided with 1% lidocaine. After creating a small venotomy incision, a 21 gauge needle was advanced into the left internal jugular vein under direct, real-time ultrasound guidance. Ultrasound image documentation was performed. After securing guidewire access, an 8 Fr dilator was placed. A J-wire was kinked to measure appropriate catheter length. A Palindrome tunneled hemodialysis catheter measuring 23 cm from tip to cuff was chosen for placement. This was tunneled in a retrograde fashion from the chest wall to the venotomy incision. At the venotomy, serial dilatation was performed and a 16 Fr peel-away sheath was placed over a guidewire. The catheter was then placed through the sheath and the sheath removed. Final catheter positioning was confirmed and documented with a fluoroscopic spot image. The catheter was aspirated, flushed with saline, and injected with appropriate volume heparin dwells. The venotomy incision was closed with subcuticular 4-0 Vicryl. Dermabond was applied to the incision. The catheter exit site was secured with 0-Prolene retention sutures. COMPLICATIONS: None.  No pneumothorax. FINDINGS: After catheter placement, the tip lies in the right atrium. The catheter aspirates normally and is ready for immediate use. IMPRESSION: Placement of tunneled hemodialysis catheter via the left internal jugular vein. The catheter tip lies in the right atrium. The catheter is ready for immediate use. Electronically Signed   By: Aletta Edouard M.D.   On: 12/27/2019 10:13   DG CHEST PORT 1 VIEW  Result Date: 12/17/2019 CLINICAL DATA:  Acute respiratory  failure. EXAM: PORTABLE CHEST 1 VIEW COMPARISON:  12/16/2019 FINDINGS: Right IJ catheter tip is at the cavoatrial junction. Feeding tube and NG tube are identified. The tips are both well below the level of the GE junction. Heart size is normal. Unchanged bilateral pleural effusions. Mild bibasilar atelectasis. IMPRESSION: Stable bilateral pleural effusions with bibasilar atelectasis. Electronically Signed   By: Kerby Moors  M.D.   On: 12/17/2019 07:42   DG Chest Port 1 View  Result Date: 12/16/2019 CLINICAL DATA:  Complication of central venous catheter EXAM: PORTABLE CHEST 1 VIEW COMPARISON:  12/15/2019, 12/16/2019 FINDINGS: Single frontal view of the chest demonstrates stable position of the right internal jugular central venous catheter overlying superior vena cava. There are 2 enteric catheters extending below the diaphragm, tips excluded by collimation. Cardiac silhouette is stable. Bibasilar veiling opacities, right greater than left, consistent with consolidation and effusion. No pneumothorax. IMPRESSION: 1. Support devices as above. 2. Stable bibasilar veiling opacities consistent with consolidation and effusions. Electronically Signed   By: Randa Ngo M.D.   On: 12/16/2019 20:33   DG CHEST PORT 1 VIEW  Result Date: 12/16/2019 CLINICAL DATA:  Acute respiratory failure EXAM: PORTABLE CHEST 1 VIEW COMPARISON:  12/14/2018 FINDINGS: Gastric catheter and right jugular dialysis catheter are noted and stable. Cardiac shadow is within normal limits. Small bilateral pleural effusions are seen with bibasilar atelectasis. These have increased slightly in the interval from the prior exam IMPRESSION: Slight increase in pleural effusion particularly on the right. Bibasilar atelectatic changes are noted. Electronically Signed   By: Inez Catalina M.D.   On: 12/16/2019 09:23   DG Chest Port 1 View  Result Date: 12/15/2019 CLINICAL DATA:  74 year old female central line placement. EXAM: PORTABLE CHEST 1 VIEW  COMPARISON:  1436 hours today. FINDINGS: Portable AP semi upright view at 1607 hours. Right IJ approach central line placed, tip at the level of the lower SVC. No pneumothorax. Stable cardiac size and mediastinal contours. Stable low lung volumes. Mildly increased perihilar platelike opacity most resembling atelectasis. Otherwise stable ventilation. Enteric tube also placed and courses to the stomach with side hole at the level of the gastric body. There is a small volume of contrast in the gastric fundus. IMPRESSION: 1. Right IJ central line placed, tip at the lower SVC level. No adverse features. 2. Enteric tube placed, side hole at the level of the gastric body. 3. Continued low lung volumes with increased perihilar atelectasis from earlier today. Electronically Signed   By: Genevie Ann M.D.   On: 12/15/2019 16:22   DG CHEST PORT 1 VIEW  Result Date: 12/15/2019 CLINICAL DATA:  Hypoxia, abdominal pain EXAM: PORTABLE CHEST 1 VIEW COMPARISON:  12/14/2019 FINDINGS: Single frontal view of the chest demonstrates a stable cardiac silhouette. Continued low lung volumes. Increasing consolidation at the left lung base. No large effusion or pneumothorax. IMPRESSION: 1. Progressive left lower lobe consolidation favor atelectasis. Electronically Signed   By: Randa Ngo M.D.   On: 12/15/2019 15:07   DG Chest Port 1 View  Result Date: 12/14/2019 CLINICAL DATA:  74 year old with acute pancreatitis and shortness of breath. EXAM: PORTABLE CHEST 1 VIEW COMPARISON:  12/29/2018 and earlier. FINDINGS: Markedly suboptimal inspiration accounts for atelectasis in the lung bases, LEFT greater than RIGHT. Lungs otherwise clear. Pulmonary vascularity normal. Cardiac silhouette normal in size for AP portable technique and degree of inspiration. IMPRESSION: Markedly suboptimal inspiration accounts for bibasilar atelectasis, LEFT greater than RIGHT. Electronically Signed   By: Evangeline Dakin M.D.   On: 12/14/2019 09:38   DG ERCP  BILIARY & PANCREATIC DUCTS  Result Date: 12/14/2019 CLINICAL DATA:  74 year old female with choledocholithiasis EXAM: ERCP TECHNIQUE: Multiple spot images obtained with the fluoroscopic device and submitted for interpretation post-procedure. FLUOROSCOPY TIME:  Fluoroscopy Time:  1 minutes 34 seconds Radiation Exposure Index (if provided by the fluoroscopic device): 24.6 mGy Number of Acquired Spot Images:  0 COMPARISON:  CT scan of the abdomen and pelvis 12/13/2019 FINDINGS: Two intraoperative saved images are submitted for review. The images demonstrate a flexible endoscope in the descending duodenum with wire cannulation of the common bile duct. The second image demonstrates placement of a plastic biliary stent. IMPRESSION: ERCP with placement of plastic biliary stent. These images were submitted for radiologic interpretation only. Please see the procedural report for the amount of contrast and the fluoroscopy time utilized. Electronically Signed   By: Jacqulynn Cadet M.D.   On: 12/14/2019 14:37   DG Abd Portable 1V  Result Date: 12/16/2019 CLINICAL DATA:  Feeding tube placement EXAM: PORTABLE ABDOMEN - 1 VIEW COMPARISON:  None. FINDINGS: An NG tube terminates in the stomach. The feeding tube terminates near the ligament of Treitz in the left side of the abdomen. IMPRESSION: The feeding tube terminates near the ligament of Treitz in the left side of the abdomen. The NG tube is in good position. Electronically Signed   By: Dorise Bullion III M.D   On: 12/16/2019 15:22   DG Abd Portable 1V  Result Date: 12/15/2019 CLINICAL DATA:  Hypoxia, abdominal pain EXAM: PORTABLE ABDOMEN - 1 VIEW COMPARISON:  12/14/2019 FINDINGS: Supine frontal views of the abdomen and pelvis demonstrate common bile duct stent overlying right upper quadrant. Dilated gas-filled loops of small bowel in the left mid abdomen measure up to 3.3 cm in diameter. There is a relative paucity of distal bowel gas. No masses or abnormal  calcifications. IMPRESSION: 1. Common bile duct stent as above. 2. Nonspecific gaseous distention of the small bowel, which could reflect postprocedural ileus. Electronically Signed   By: Randa Ngo M.D.   On: 12/15/2019 15:08     Subjective: Seen and examined at bedside and she is doing well and had no complaints.  Urinating appropriate Foley has been removed and she has had no nausea or vomiting.  Feels well and has been ambulating and denies any other complaints.  Stable for discharge given that her renal function is improving and will have her dialysis catheter removed.  Following up with nephrology and gastroenterology in outpatient setting and she understands agrees with the plan of care.   Discharge Exam: Vitals:   01/03/20 0419 01/03/20 1353  BP: (!) 163/78 (!) 157/79  Pulse: 91 94  Resp: 20 18  Temp: 98.7 F (37.1 C) 98.9 F (37.2 C)  SpO2: 93% 94%   Vitals:   01/02/20 2033 01/02/20 2035 01/03/20 0419 01/03/20 1353  BP: (!) 159/79 (!) 158/77 (!) 163/78 (!) 157/79  Pulse: 91 90 91 94  Resp: (!) 22  20 18   Temp: 98.5 F (36.9 C)  98.7 F (37.1 C) 98.9 F (37.2 C)  TempSrc: Oral  Oral Oral  SpO2: 93%  93% 94%  Weight:      Height:       General: Pt is alert, awake, not in acute distress Cardiovascular: Irregularly irregular, S1/S2 +, no rubs, no gallops Respiratory: Diminished bilaterally, no wheezing, no rhonchi; unlabored breathing Abdominal: Soft, NT, stented second body habitus, bowel sounds + Extremities: 1+ lower extremity edema bilaterally , no cyanosis  The results of significant diagnostics from this hospitalization (including imaging, microbiology, ancillary and laboratory) are listed below for reference.    Microbiology: No results found for this or any previous visit (from the past 240 hour(s)).   Labs: BNP (last 3 results) Recent Labs    12/15/19 0900  BNP 93.7   Basic Metabolic Panel: Recent Labs  Lab 12/30/19 1418 12/31/19 0716  01/01/20 0334 01/02/20 0340 01/03/20 1215  NA 133* 132* 132* 135 137  K 3.7 3.4* 3.2* 4.0 3.3*  CL 97* 96* 97* 101 101  CO2 24 23 23 22 23   GLUCOSE 175* 143* 137* 145* 162*  BUN 33* 37* 41* 40* 40*  CREATININE 4.53* 4.68* 4.41* 3.84* 2.94*  CALCIUM 8.3* 8.5* 8.5* 8.6* 8.8*  MG 1.9 1.7 1.6* 1.9 1.5*  PHOS 4.5 5.2* 4.6 4.3 3.8   Liver Function Tests: Recent Labs  Lab 12/29/19 0210 12/29/19 0210 12/30/19 1418 12/31/19 0716 01/01/20 0334 01/02/20 0340 01/03/20 1215  AST 15  --  15  --   --  29 18  ALT 9  --  6  --   --  <5 10  ALKPHOS 124  --  114  --   --  93 108  BILITOT 0.9  --  0.6  --   --  1.2 0.8  PROT 5.2*  --  5.2*  --   --  5.2* 5.9*  ALBUMIN 2.2*   < > 2.3* 2.3* 2.2* 2.4* 2.6*   < > = values in this interval not displayed.   No results for input(s): LIPASE, AMYLASE in the last 168 hours. No results for input(s): AMMONIA in the last 168 hours. CBC: Recent Labs  Lab 12/30/19 1418 12/30/19 1418 12/31/19 0716 01/01/20 0334 01/01/20 1849 01/02/20 0340 01/03/20 1215  WBC 9.8  --  8.0 7.1  --  8.1 8.2  NEUTROABS 8.1*  --  6.4 5.6  --  6.6 6.9  HGB 8.3*   < > 8.3* 7.7* 9.5* 9.4* 9.5*  HCT 25.8*   < > 26.1* 23.8* 28.7* 28.7* 29.4*  MCV 87.2  --  89.1 86.5  --  85.9 85.5  PLT 239  --  228 213  --  203 182   < > = values in this interval not displayed.   Cardiac Enzymes: No results for input(s): CKTOTAL, CKMB, CKMBINDEX, TROPONINI in the last 168 hours. BNP: Invalid input(s): POCBNP CBG: Recent Labs  Lab 01/02/20 1155 01/02/20 1648 01/02/20 2030 01/03/20 0733 01/03/20 1146  GLUCAP 143* 156* 139* 154* 147*   D-Dimer No results for input(s): DDIMER in the last 72 hours. Hgb A1c No results for input(s): HGBA1C in the last 72 hours. Lipid Profile No results for input(s): CHOL, HDL, LDLCALC, TRIG, CHOLHDL, LDLDIRECT in the last 72 hours. Thyroid function studies No results for input(s): TSH, T4TOTAL, T3FREE, THYROIDAB in the last 72 hours.  Invalid  input(s): FREET3 Anemia work up No results for input(s): VITAMINB12, FOLATE, FERRITIN, TIBC, IRON, RETICCTPCT in the last 72 hours. Urinalysis    Component Value Date/Time   COLORURINE AMBER (A) 12/15/2019 0920   APPEARANCEUR CLOUDY (A) 12/15/2019 0920   APPEARANCEUR Cloudy (A) 12/12/2019 1332   LABSPEC 1.035 (H) 12/15/2019 0920   PHURINE 5.0 12/15/2019 0920   GLUCOSEU 50 (A) 12/15/2019 0920   HGBUR LARGE (A) 12/15/2019 0920   BILIRUBINUR NEGATIVE 12/15/2019 0920   BILIRUBINUR Negative 12/12/2019 1332   KETONESUR 5 (A) 12/15/2019 0920   PROTEINUR 100 (A) 12/15/2019 0920   UROBILINOGEN 0.2 02/12/2018 0805   NITRITE NEGATIVE 12/15/2019 0920   LEUKOCYTESUR NEGATIVE 12/15/2019 0920   Sepsis Labs Invalid input(s): PROCALCITONIN,  WBC,  LACTICIDVEN Microbiology No results found for this or any previous visit (from the past 240 hour(s)).  Time coordinating discharge: 35 minutes  SIGNED:  Kerney Elbe, DO [facility] 01/03/2020, 3:22 PM Pager  is on AMION  If 7PM-7AM, please contact night-coverage [email or web address] Password [email or web address]

## 2020-01-03 NOTE — Progress Notes (Signed)
Nutrition Follow-up  RD working remotely.  DOCUMENTATION CODES:   Not applicable  INTERVENTION:   -D/cNepro Shake po TID, each supplement provides 425 kcal and 19 grams protein -D/c renal MVI daily -Continue Magic cup TID with meals, each supplement provides 290 kcal and 9 grams of protein  NUTRITION DIAGNOSIS:   Inadequate oral intake related to altered GI function, acute illness as evidenced by NPO status.  Progressing; advanced to soft diet  GOAL:   Patient will meet greater than or equal to 90% of their needs  Progressing  MONITOR:   PO intake, Supplement acceptance, Labs, Weight trends, Skin, I & O's  REASON FOR ASSESSMENT:   Rounds    ASSESSMENT:   74 yo female admitted with severe sepsis with multi-orgran failure secondary to acute gallstone pancreatitis. PMH includes HTN, DM, CKD III, GERD, hepatic steatosis  2/09 Admitted 2/10 ERCP with stent placement 2/11 Transfer to ICU, CRRT initiated 2/12 Cortrak placed (tip at LOT), TF initiated 2/14 CRRT stopped 2/15 EGD: grade II distal esophagitis, multiple gastric ulcers 2/16 TF d/c, diet advanced; transferred out of ICU to floor 2/18- cortrak tube removed 2/19- advanced to full liquid diet 2/20- advanced to soft diet 2/23- s/p HD cath placement  Reviewed I/O's: -810 ml x 24 hours and -2.5 L since 12/20/19  UOP: 1.4 L x 24 hours  Attempted to speak with pt via phone, however, no answer.  Per nephrology notes, last HD on 12/28/19. Pt with continues improvement in renal function. Per MD notes, plan to d/c home today once tunnelled catheter is removed.   Pt with improving appetite. Noted meal completion 50-75%. She is refusing Nepro supplements.   Labs reviewed: K: 3.3, Mg: 1.5 (on IV supplementation), CBGS: 139-154 (inpatient orders for glycemic control are 0-5 units insulin aspart q HS, 0-9 units insulin aspart TID with meals, and 14 units insulin glargine daily).   Diet Order:   Diet Order           Diet - low sodium heart healthy        Diet Carb Modified        DIET SOFT Room service appropriate? Yes; Fluid consistency: Thin  Diet effective now              EDUCATION NEEDS:   Not appropriate for education at this time  Skin:  Skin Assessment: Skin Integrity Issues: Skin Integrity Issues:: Other (Comment) Other: MASD sacrum and perineum  Last BM:  01/03/20  Height:   Ht Readings from Last 1 Encounters:  12/13/19 5' 0.98" (1.549 m)    Weight:   Wt Readings from Last 1 Encounters:  12/28/19 84 kg   BMI:  Body mass index is 35.01 kg/m.  Estimated Nutritional Needs:   Kcal:  1700-1900 kcals  Protein:  90-105 g  Fluid:  >/= 1.8 L    Loistine Chance, RD, LDN, [place] Registered Dietitian II Certified Diabetes Care and Education Specialist Please refer to [facility] for RD and/or RD on-call/weekend/after hours pager

## 2020-01-03 NOTE — Procedures (Signed)
Pre procedural Dx: AKI Post procedural Dx: Resolved AKI  Successful removal of tunneled HD catheter. Catheter removed intact   EBL: None No immediate complications.  Please see imaging section of Epic for full dictation.  Avel Peace NP 01/03/2020 4:14 PM

## 2020-01-03 NOTE — Progress Notes (Signed)
[facility] Progress Note    Assessment/ Plan:   1. AKI- in setting of sepsis and severe acute pancreatitis +/- IV contrast exposure. Baseline Cr 1 -1.2. CRRT started on 12/15/19 - 2/14 then transitioned to iHD. Last HD 2/24.  - Holding HD todayand assess dialysis needs daily - transfused PRBC's  to optimize anemia for renal recovery - Will continue to follow inpatient for renal recoverythough she has an AKI spot at [facility] TTS at 11:20 am with 10:40 arrival for first treatment time if one is needed.   D/w team and if stat BMET shows continued improvement in renal function then can remove the LIJ TC with f/u with CKD in 2 weeks.  2. Acute gallstone pacreatitis- s/p ERCP with biliary stent placement.  3. Anemia of critical illness and GIB- stable. Continue to follow and transfuse prn. aranesp 40 mcg once on 2/23 - will increase dose to 60 mcg on 3/2.Transfuse packed red blood cells on 2/28 . 4. Atrial fibrillation - with hx RVR. Per primary team  5. Leukocytosis- resolved.  6. Hyponatremia - resolved with HD and mild 7. HTN -Started amlodipine 2.5 mg daily for now. No home meds listed for HTN.  Subjective:   She has a decent appetite, denies n/v/cp. Fatigued but wondering when she's leaving the hospital.   Objective:   BP (!) 163/78 (BP Location: Right Arm)   Pulse 91   Temp 98.7 F (37.1 C) (Oral)   Resp 20   Ht 5' 0.98" (1.549 m)   Wt 84 kg   SpO2 93%   BMI 35.01 kg/m   Intake/Output Summary (Last 24 hours) at 01/03/2020 1112 Last data filed at 01/03/2020 0830 Gross per 24 hour  Intake 540 ml  Output 1350 ml  Net -810 ml   Weight change:   Physical Exam: Gen: adult female NAD CVS: S1S2 no rub Resp: cta unlabored and on room air  Abd: soft, nontender; obese habitus Ext: no pitting edema Neuro alert and oriented x3 ; provides hx and follows commands Access left chest tunneled catheter  Imaging: No results  found.  Labs: BMET Recent Labs  Lab 12/28/19 0420 12/29/19 0210 12/30/19 1418 12/31/19 0716 01/01/20 0334 01/02/20 0340  NA 128* 135 133* 132* 132* 135  K 3.4* 3.7 3.7 3.4* 3.2* 4.0  CL 92* 98 97* 96* 97* 101  CO2 22 27 24 23 23 22   GLUCOSE 137* 154* 175* 143* 137* 145*  BUN 40* 18 33* 37* 41* 40*  CREATININE 6.02* 3.86* 4.53* 4.68* 4.41* 3.84*  CALCIUM 8.1* 8.1* 8.3* 8.5* 8.5* 8.6*  PHOS 6.0* 3.6 4.5 5.2* 4.6 4.3   CBC Recent Labs  Lab 12/30/19 1418 12/30/19 1418 12/31/19 0716 01/01/20 0334 01/01/20 1849 01/02/20 0340  WBC 9.8  --  8.0 7.1  --  8.1  NEUTROABS 8.1*  --  6.4 5.6  --  6.6  HGB 8.3*   < > 8.3* 7.7* 9.5* 9.4*  HCT 25.8*   < > 26.1* 23.8* 28.7* 28.7*  MCV 87.2  --  89.1 86.5  --  85.9  PLT 239  --  228 213  --  203   < > = values in this interval not displayed.    Medications:    . amiodarone  200 mg Oral Daily  . amLODipine  2.5 mg Oral Daily  . Chlorhexidine Gluconate Cloth  6 each Topical Daily  . Chlorhexidine Gluconate Cloth  6 each Topical Q0600  . darbepoetin (ARANESP) injection -  NON-DIALYSIS  60 mcg Subcutaneous Once  . feeding supplement (NEPRO CARB STEADY)  237 mL Oral TID BM  . fluticasone  1 spray Each Nare Daily  . insulin aspart  0-5 Units Subcutaneous QHS  . insulin aspart  0-9 Units Subcutaneous TID WC  . insulin glargine  14 Units Subcutaneous Daily  . multivitamin  1 tablet Oral QHS  . pantoprazole  40 mg Oral BID AC  . sodium chloride flush  10-40 mL Intracatheter Q12H  . sucralfate  1 g Oral Q6H      Otelia Santee, MD 01/03/2020, 11:12 AM

## 2020-01-03 NOTE — Progress Notes (Signed)
AVS given and reviewed with pt and pt's husband. Medications discussed. All questions answered to satisfaction. Pt and pt's husband verbalized understanding of information given. Pt to be escorted off the unit with all belongings via wheelchair by staff member.

## 2020-01-03 NOTE — Plan of Care (Signed)
  Problem: Education: Goal: Knowledge of General Education information will improve Description: Including pain rating scale, medication(s)/side effects and non-pharmacologic comfort measures Outcome: Progressing   Problem: Health Behavior/Discharge Planning: Goal: Ability to manage health-related needs will improve Outcome: Progressing   Problem: Clinical Measurements: Goal: Ability to maintain clinical measurements within normal limits will improve Outcome: Progressing Goal: Cardiovascular complication will be avoided Outcome: Progressing   Problem: Activity: Goal: Risk for activity intolerance will decrease Outcome: Progressing   Problem: Nutrition: Goal: Adequate nutrition will be maintained Outcome: Progressing   Problem: Elimination: Goal: Will not experience complications related to bowel motility Outcome: Progressing Goal: Will not experience complications related to urinary retention Outcome: Progressing   Problem: Pain Managment: Goal: General experience of comfort will improve Outcome: Progressing   Problem: Safety: Goal: Ability to remain free from injury will improve Outcome: Progressing   Problem: Skin Integrity: Goal: Risk for impaired skin integrity will decrease Outcome: Progressing

## 2020-01-04 ENCOUNTER — Emergency Department (HOSPITAL_COMMUNITY)
Admission: EM | Admit: 2020-01-04 | Discharge: 2020-01-04 | Disposition: A | Discharge status: Home / Self Care | Payer: Medicare Other | Source: Home / Self Care

## 2020-01-04 ENCOUNTER — Encounter (HOSPITAL_COMMUNITY): Payer: Self-pay | Admitting: Emergency Medicine

## 2020-01-04 ENCOUNTER — Telehealth: Payer: Self-pay

## 2020-01-04 ENCOUNTER — Other Ambulatory Visit: Payer: Self-pay

## 2020-01-04 LAB — URINALYSIS, ROUTINE W REFLEX MICROSCOPIC
Bilirubin Urine: NEGATIVE
Glucose, UA: >=500 mg/dL — AB
Hgb urine dipstick: NEGATIVE
Ketones, ur: NEGATIVE mg/dL
Leukocytes,Ua: NEGATIVE
Nitrite: NEGATIVE
Protein, ur: NEGATIVE mg/dL
Specific Gravity, Urine: 1.013 (ref 1.005–1.030)
pH: 5 (ref 5.0–8.0)

## 2020-01-04 LAB — CBC
HCT: 30.4 % — ABNORMAL LOW (ref 36.0–46.0)
Hemoglobin: 9.6 g/dL — ABNORMAL LOW (ref 12.0–15.0)
MCH: 28.2 pg (ref 26.0–34.0)
MCHC: 31.6 g/dL (ref 30.0–36.0)
MCV: 89.4 fL (ref 80.0–100.0)
Platelets: 191 10*3/uL (ref 150–400)
RBC: 3.4 MIL/uL — ABNORMAL LOW (ref 3.87–5.11)
RDW: 15.6 % — ABNORMAL HIGH (ref 11.5–15.5)
WBC: 15.2 10*3/uL — ABNORMAL HIGH (ref 4.0–10.5)
nRBC: 0 % (ref 0.0–0.2)

## 2020-01-04 LAB — COMPREHENSIVE METABOLIC PANEL
ALT: 13 U/L (ref 0–44)
AST: 23 U/L (ref 15–41)
Albumin: 2.8 g/dL — ABNORMAL LOW (ref 3.5–5.0)
Alkaline Phosphatase: 112 U/L (ref 38–126)
Anion gap: 13 (ref 5–15)
BUN: 38 mg/dL — ABNORMAL HIGH (ref 8–23)
CO2: 21 mmol/L — ABNORMAL LOW (ref 22–32)
Calcium: 8.8 mg/dL — ABNORMAL LOW (ref 8.9–10.3)
Chloride: 100 mmol/L (ref 98–111)
Creatinine, Ser: 2.65 mg/dL — ABNORMAL HIGH (ref 0.44–1.00)
GFR calc Af Amer: 20 mL/min — ABNORMAL LOW (ref >60)
GFR calc non Af Amer: 17 mL/min — ABNORMAL LOW (ref >60)
Glucose, Bld: 315 mg/dL — ABNORMAL HIGH (ref 70–99)
Potassium: 4 mmol/L (ref 3.5–5.1)
Sodium: 134 mmol/L — ABNORMAL LOW (ref 135–145)
Total Bilirubin: 0.7 mg/dL (ref 0.3–1.2)
Total Protein: 5.8 g/dL — ABNORMAL LOW (ref 6.5–8.1)

## 2020-01-04 LAB — LIPASE, BLOOD: Lipase: 35 U/L (ref 11–51)

## 2020-01-04 MED ORDER — SODIUM CHLORIDE 0.9% FLUSH: 3.0000 mL | Freq: Once | INTRAVENOUS | Status: DC

## 2020-01-04 NOTE — Telephone Encounter (Signed)
Spoke to patient by telephone and was advised that she is having abdominal pain that is a level 8-10 that has not gone away all day. Patient stated that she has taken two hydrocodone that eased the pain a little for a short while. Patient stated that she took a Zofran this morning and threw it right up. Patient that she is having back pain also. Patient stated that she is on a bland diet. Patient stated the pain is the same pain that she had while in the hospital. Patient stated the pain is in the middle of her abdomin that goes to her left side and into her back. Patient stated that she does not have a fever. After speaking to Dr. Danise Mina patient was advised that if she is still having the pain that she had while in the hospital and has had it all day she should go back to the ER to be evaluated. Patient and her husband stated that he will take her back to [facility] ER to be evaluated because she can not take another night like she had last night.

## 2020-01-04 NOTE — Telephone Encounter (Signed)
Pt already has a HFU scheduled for 01/06/20 at 11:30 with Dr Darnell Level. I do not see insulin on med list? FYI to Dr Baldwin Crown CMA.

## 2020-01-04 NOTE — Telephone Encounter (Signed)
[facility] Night - Client Nonclinical Telephone Record AccessNurse Client [facility] Night - Client Client Site [place] Physician [name] - MD Contact Type Call Who Is Calling Patient / Member / Family / Caregiver Caller Name Hadiyah Maricle Caller Phone Number [phone] Patient Name Meiko Ives Patient DOB 04-22-1946 Call Type Message Only Information Provided Reason for Call Request for General Office Information Initial Comment Caller states his wife is being discharged from the hospital and he need to schedule an appointment as soon as possible. She has been in the hospital since Feb.2 she need her insulin medicinces. Additional Comment Disp. Time Disposition Final User 01/03/2020 5:49:27 PM General Information Provided Yes Renne Crigler Call Closed By: Renne Crigler Transaction Date/Time: 01/03/2020 5:44:54 PM (ET)

## 2020-01-04 NOTE — Telephone Encounter (Signed)
Returned Meredith's call.  Spoke with Cheyene of [name] informing her Dr. Darnell Level agrees with nursing services requested.  She verbalizes understanding and will relay info.   Pt has been triaged. [see other note]

## 2020-01-04 NOTE — ED Notes (Signed)
[phone] pts husband Micha please update, aware pt is in waiting as of right now

## 2020-01-04 NOTE — Telephone Encounter (Signed)
Please call for TCM hosp f/u visit phone call and clarification. It looks like she was discharged on new regular insulin. Thanks.

## 2020-01-04 NOTE — Telephone Encounter (Signed)
Katherine Oliver called back. Lattie Haw was on the phone.  Please call Katherine Oliver back at [phone].

## 2020-01-04 NOTE — Telephone Encounter (Signed)
Agree with this. Please see my other note and triage patient for abd pain, nausea/vomiting.

## 2020-01-04 NOTE — Telephone Encounter (Signed)
Transition Care Management Follow-up Telephone Call  Date of discharge and from where: [facility]  How have you been since you were released from the hospital? Husband states that patient is not doing too well. Patient is complaining of abdominal pain, nausea and has started vomiting.   Any questions or concerns? Yes , Patient having abdominal pain, nausea and vomiting currently.   Items Reviewed:  Did the pt receive and understand the discharge instructions provided? Yes   Medications obtained and verified? Yes   Any new allergies since your discharge? No   Dietary orders reviewed? Yes  Do you have support at home? Yes   Functional Questionnaire: (I = Independent and D = Dependent) ADLs: I  Bathing/Dressing- I  Meal Prep- I  Eating- I  Maintaining continence- I  Transferring/Ambulation- I, uses a walker  Managing Meds- I  Follow up appointments reviewed:   PCP Hospital f/u appt confirmed? Yes  Scheduled to see Dr. Danise Mina on 01/06/2020 @ 11:30 am. Told patient I would let provider know about her current abdominal pain and vomiting and someone should follow up with her.   [facility] f/u appt confirmed? Yes  Scheduled to see cardiology, nephrology and gastroenterology.  Are transportation arrangements needed? No   If their condition worsens, is the pt aware to call PCP or go to the Emergency Dept.? Yes  Was the patient provided with contact information for the PCP's office or ED? Yes  Was to pt encouraged to call back with questions or concerns? Yes

## 2020-01-04 NOTE — Telephone Encounter (Signed)
Meredith with [name], l/m stating that they received patient for their services from the hospital. Patient was D/C yesterday. Order was put in for P.T. Ailene Ravel spoke with patient's husband and found out that patient has pancreatitis and had stent placed, also was started on insulin which patient and husband are very unfamiliar with. Husband has a lot of questions, Ailene Ravel would like to know if Nursing services can be added to help patient. And hopefully to start nursing services tomorrow-01/05/20. Patient was not feeling well this morning. Meredith's CB is [phone].

## 2020-01-04 NOTE — Telephone Encounter (Signed)
Please see TCM note. Patient is complaining of abdominal pain, nausea and vomiting this morning. Glucose this morning was 171. Hospital follow up was already scheduled by husband for 01/06/20 @ 11:30 am. Told patient that I would notify her provider about her current status and the clinical staff would follow up with any further recommendations.

## 2020-01-04 NOTE — Telephone Encounter (Signed)
Agree with this thank you.  Will need further workup at ER today, not wait until appt Friday.

## 2020-01-04 NOTE — Telephone Encounter (Addendum)
I'm just getting to this now.  Please further triage patient.

## 2020-01-04 NOTE — ED Triage Notes (Addendum)
Pt arrives to ED from home with complaints of left lower quadrant abdominal pain that worsened last night. Patient was discharged from [facility] yesterday from gallstone pancreatitis. Patient states her pain was manageable yesterday but it is severe today. Patient also has tunneled hemodialysis catheter for dialysis due to her AKI.

## 2020-01-05 ENCOUNTER — Encounter: Payer: Self-pay | Admitting: *Deleted

## 2020-01-05 ENCOUNTER — Telehealth: Payer: Self-pay

## 2020-01-05 ENCOUNTER — Other Ambulatory Visit: Payer: Self-pay | Admitting: *Deleted

## 2020-01-05 NOTE — Telephone Encounter (Signed)
Kidney function is a little better but still poor. Sugar was too high, white count is high again concerning for infection. Pancreas returned ok.  Ongoing nausea and vomiting. Any fever? How is abdominal pain today?  Do they have any insulin at home to use?  Would recommend return to ER for evaluation today due to elevated white count. Could they go to [facility]? If they agree, please call [facility] to notify pt on the way with her current symptoms and high white count.

## 2020-01-05 NOTE — Patient Outreach (Signed)
[facility] [place] Telephone Outreach PCP office completes Transition of Care follow up post-hospital discharge Post-hospital discharge day # 2  01/05/2020  Katherine Oliver [facility] 19-Oct-1946 [id number]  Successful telephone outreach to Katherine Oliver, 74 y/o female referred to [facility] by [facility] Liaison during extended inpatient hospitalization February 9- January 03, 2020 for pancreatitis related to gallstones/ UTI; patient had multiple complications during hospitalization, including sepsis, new onset A-Fib with RVR; GI bleeding; and acute renal failure requiring hemodialysis. Patient was discharged home to self care with home health services in place ([name]) for PT.  Patient has history including, but not limited to, DM-II; HTN/ HLD; GERD; and CKD-III.  HIPAA/ identity verified with patient and [facility] CM services were discussed with patient; patient provides verbal consent for [facility] CM sevrices post-hospital discharge.  Patient provides verbal consent to speak with her husband, Katherine Oliver, "any time," and he noted to be on DPR and participates in call today while phone on speaker mode.  Today, patient reports "not doing good at all;" post-hospital discharge; states that she immediately began throwing up when she got home from the hospital, and had to return to the ED the very night she was discharged; unfortunately, patient states she had an extended wait in the waiting area and was never seen by EDP; patient left without being seen after waiting alone in the waiting room "for 6 hours with no care;" stated the staff would not allow her husband to wait with her.  Patient verbalized multiple concerns around her hospital discharge and the subsequent ED visit that night, and I provided the phone number to the patient experience department and encouraged her to call.  Reports doing "some better, but so very weak" today, although she remains in pain  (abdominal) that she describes as "10/10" and has not eased since she was hospitalized.  Reports now able to take in fluids "a little bit," but remains essentially unable to eat; unable to take oral medications.  Patient denies falls post-hospital discharge and sounds intermittently uncomfortable during our phone call, but not in acute distress.  Patient further reports: -- unable to take oral medications due to ongoing abdominal pain. Intermittent nausea/ vomiting -- home health services have contacted patient's husband but have not yet arrived/ initiated services -- monitoring blood sugars "all running high;" states that she was originally told she would be discharged on insulin, however this was "suddenly changed" prior to discharge-- however she is unable to take her established oral medications now that she is at home; reports blood sugar this morning of "274;" states that prior to this hospitalization, she had well controlled DM- II; patient verbalizes concern that she might be prescribed insulin-- states "has had no instruction in using insulin;" discussed need for in-person education prior to starting insulin at home; discussed potential risks/ dangers of insulin use -- has scheduled PCP office visit tomorrow; noted from review of EMR that patient has been in close contact with PCP who is aware of the concerns she is verbalizing today, and has advised for her to return to the ED-- patient adamantly refuses returning to the ED today, stating she had a horrible experience last night and is essentially "afraid" to go back; confirms that her husband will provide transportation to in-person office visit tomorrow; denies transportation issues -- coached patient around talking points to cover with PCP; difference between [facility] CM and home health services and possible need for home health RN to  be initiated; need for blood sugar management/ abdominal pain with nausea/ vomiting control, risks of becoming  dehydrated.  Discussed that I would reach out to PCP to inform of possible need for home health RN and make aware of [facility] CM involvement; encouraged patient to listen out for [facility] CSW team, also scheduled to contact her tomorrow; patient is agreeable.  Patient denies further issues, concerns, or problems today.  I provided/ confirmed that patient has my direct phone number, the main [facility] CM office phone number, and the [facility] CM 24-hour nurse advice phone number should issues arise prior to next scheduled [place] outreach next week.  Encouraged patient to contact me directly if needs, questions, issues, or concerns arise prior to next scheduled outreach; patient agreed to do so.  Plan:  Patient will take medications as prescribed and will attend all scheduled provider appointments  Patient will promptly notify care providers for any new concerns/ issues/ problems that arise  Patient will actively participate in home health services as ordered post-hospital discharge  Patient will continue monitoring/ recording daily blood sugars   I will make patient's PCP aware of [facility] RN CM involvement in patient's care-- will send barriers letter and secure message via EMR, notifying of patient's ongoing clinical concerns and possible need for home health RN services   [place] outreach to continue with scheduled phone call next week   [facility] CM Care Plan Problem One     Most Recent Value  Care Plan Problem One  High risk for hospital readmission related to/ as evidenced by recent extended hospitalization with complications  Role Documenting the Problem One  Care Management Coordinator  Care Plan for Problem One  Active  [facility] Long Term Goal   Over the next 31 days, patient will not experience hospital readmission as evidenced by patient reporting and review of EMR during [facility] RN CM outreach  [facility] Long Term Goal Start Date  01/05/20  Interventions for Problem One Long Term Goal  Discussed  with patient her current clinical condition and addressed her concerns,  encouraged patient to return to ED as advised by PCP,  discussed with patient and her spouse talking points/ questions to cover with PCP during tomorrow's scheduled office visit,  reviewed discharge instructions with patient and spouse,  initiated [facility] CM program  [facility] CM Short Term Goal #1   Over the next 30 days, patient will attend all scheduled provider appointments, as evidenced by patient reporting and collaboration with care providers as indicated during [facility] RN CM outreach  [facility] CM Short Term Goal #1 Start Date  01/05/20  Interventions for Short Term Goal #1  Reviewed upcoming provider appointments with patient and spouse and confirmed that patient is aware, has plans to attend and has reliable transportation to appointments  [facility] CM Short Term Goal #2   Over the next 30 days, patient will actively participate in home health services as ordered post-hospital discharge as evidenced by patient reporting and collaboration with home health team as indicated  [facility] CM Short Term Goal #2 Start Date  01/05/20  Interventions for Short Term Goal #2  Discussed differences between home health services and [facility] CM services,  encouraged patient to discuss need for home health RN services as well as home health PT which is already ordered,  sent care coordination outreach to PCP to request order for home health RN     [place] Telephone [facility] Coordination  ----- Message ----- From:  Knox Royalty, RN Sent: 01/05/2020   4:23 PM EST To: [name], MD Subject: Please consider ordering home health RN for   Hi Dr. Darnell Level  I know you are well aware of the issues/ concerns that patient has been having post- hospital discharge January 03, 2020  Spoke with her this afternoon and encouraged her to return to the ED as per your recommendations, however, she confirmed that she is very apprehensive about returning to the ER after being  left alone in triage for over 6 hours last night.  Says she cannot go through that again.  Had multiple complaints about her "lack of" care in the ED yesterday and with her recent hospital discharge, just FYI  She reports that he nausea/ vomiting is "a little better" this afternoon but confirms that she is not eating/ drinking much- states cannot tolerate fluids/ taking oral medications.  Still having abdominal pain at "10/10"  She/ husband are very concerned about whether or not she should be on insulin-- however, they have had NO training around use of insulin and are leary of starting without formal instruction.  Will you order home health RN if you feel appropriate after you see her tomorrow?  She seems very fragile and not doing well at all after her discharge from the hospital.  [place],  [name], RN, BSN, [place] -------------------------------------------------------------------------------------------------------------------------------------------------  I appreciate the opportunity to participate in [place]' care,  [name], RN, BSN, [employer] Coordinator [facility] Care Management  [phone]

## 2020-01-05 NOTE — Telephone Encounter (Signed)
Spoke with pt's husband, [name], relaying Dr. Synthia Innocent message.  States pt is not currently c/o abd pain and is not on insulin at home.  Pt's current temp, 98.5.   I relayed Dr. Synthia Innocent message.  Mr. Celia says pt is scarred to go back to ER.  Says she was by herself for so many hours at [facility] ED and she does not think she can mentally handle that again.  Says he may be able to talk her into going to an UC where the wait would not be so long.  Pls advise.

## 2020-01-05 NOTE — Telephone Encounter (Signed)
With elevated white count I do recommend ER evaluation.  We would call ER to let them know she is coming so hopefully wouldn't have to wait that long.

## 2020-01-05 NOTE — Telephone Encounter (Signed)
Tried again to contact pt/pt's husband.  Left message on vm per dpr.

## 2020-01-05 NOTE — Telephone Encounter (Signed)
Left message on vm per dpr relaying Dr. Synthia Innocent message.  Asked pt to call back letting us know if she decides to go to [facility] ED  at [phone] so we can all ahead so they can expect her.

## 2020-01-05 NOTE — Telephone Encounter (Signed)
Elizabeth, with [name], called to let Dr Darnell Level know that she called patient this morning to follow up with her. Husband spoke with Benjamine Mola because patient was resting right now. They went to ER last night and were there from 5 pm to midnight and ended up leaving without been seen by the provider because of the wait. Patient is still having issues with nausea and vomiting. Husband has not been able to give patient her insulin because he does not know how and [name] nursing has not been able to come out to see patient yet. Benjamine Mola wanted to bring attention to patient's labs and UA that was done at the hospital and if Dr Darnell Level can review to see what patient need to do at this time. Benjamine Mola said we can call her back at [phone] or call patient and speak with them if needed.

## 2020-01-06 ENCOUNTER — Encounter: Payer: Self-pay | Admitting: Family Medicine

## 2020-01-06 ENCOUNTER — Encounter: Payer: Self-pay | Admitting: *Deleted

## 2020-01-06 ENCOUNTER — Ambulatory Visit (INDEPENDENT_AMBULATORY_CARE_PROVIDER_SITE_OTHER)
Admission: RE | Admit: 2020-01-06 | Discharge: 2020-01-06 | Disposition: A | Discharge status: Home / Self Care | Payer: Medicare Other | Source: Ambulatory Visit | Attending: Family Medicine | Admitting: Family Medicine

## 2020-01-06 ENCOUNTER — Emergency Department
Admission: EM | Admit: 2020-01-06 | Discharge: 2020-01-06 | Disposition: A | Discharge status: Other Acute Inpatient Hospital | Payer: Medicare Other | Attending: Internal Medicine | Admitting: Internal Medicine

## 2020-01-06 ENCOUNTER — Encounter: Payer: Self-pay | Admitting: Emergency Medicine

## 2020-01-06 ENCOUNTER — Emergency Department: Payer: Medicare Other

## 2020-01-06 ENCOUNTER — Ambulatory Visit (INDEPENDENT_AMBULATORY_CARE_PROVIDER_SITE_OTHER): Payer: Medicare Other | Admitting: Family Medicine

## 2020-01-06 ENCOUNTER — Other Ambulatory Visit: Payer: Self-pay | Admitting: *Deleted

## 2020-01-06 ENCOUNTER — Other Ambulatory Visit: Payer: Self-pay

## 2020-01-06 VITALS — BP 140/78 | HR 116 | Temp 97.9°F | Ht 60.75 in

## 2020-01-06 DIAGNOSIS — K8050 Calculus of bile duct without cholangitis or cholecystitis without obstruction: Secondary | ICD-10-CM | POA: Diagnosis not present

## 2020-01-06 DIAGNOSIS — E1122 Type 2 diabetes mellitus with diabetic chronic kidney disease: Secondary | ICD-10-CM | POA: Diagnosis not present

## 2020-01-06 DIAGNOSIS — I480 Paroxysmal atrial fibrillation: Secondary | ICD-10-CM

## 2020-01-06 DIAGNOSIS — D649 Anemia, unspecified: Secondary | ICD-10-CM | POA: Insufficient documentation

## 2020-01-06 DIAGNOSIS — N838 Other noninflammatory disorders of ovary, fallopian tube and broad ligament: Secondary | ICD-10-CM

## 2020-01-06 DIAGNOSIS — N179 Acute kidney failure, unspecified: Secondary | ICD-10-CM

## 2020-01-06 DIAGNOSIS — Z20822 Contact with and (suspected) exposure to covid-19: Secondary | ICD-10-CM | POA: Diagnosis not present

## 2020-01-06 DIAGNOSIS — Z9889 Other specified postprocedural states: Secondary | ICD-10-CM | POA: Insufficient documentation

## 2020-01-06 DIAGNOSIS — Z87440 Personal history of urinary (tract) infections: Secondary | ICD-10-CM

## 2020-01-06 DIAGNOSIS — N1831 Chronic kidney disease, stage 3a: Secondary | ICD-10-CM | POA: Diagnosis not present

## 2020-01-06 DIAGNOSIS — K219 Gastro-esophageal reflux disease without esophagitis: Secondary | ICD-10-CM

## 2020-01-06 DIAGNOSIS — R059 Cough, unspecified: Secondary | ICD-10-CM

## 2020-01-06 DIAGNOSIS — K8591 Acute pancreatitis with uninfected necrosis, unspecified: Secondary | ICD-10-CM | POA: Diagnosis not present

## 2020-01-06 DIAGNOSIS — R109 Unspecified abdominal pain: Secondary | ICD-10-CM

## 2020-01-06 DIAGNOSIS — R1032 Left lower quadrant pain: Secondary | ICD-10-CM | POA: Diagnosis present

## 2020-01-06 DIAGNOSIS — I10 Essential (primary) hypertension: Secondary | ICD-10-CM

## 2020-01-06 DIAGNOSIS — E1165 Type 2 diabetes mellitus with hyperglycemia: Secondary | ICD-10-CM | POA: Diagnosis not present

## 2020-01-06 DIAGNOSIS — K851 Biliary acute pancreatitis without necrosis or infection: Secondary | ICD-10-CM | POA: Diagnosis not present

## 2020-01-06 DIAGNOSIS — R05 Cough: Secondary | ICD-10-CM

## 2020-01-06 DIAGNOSIS — I4891 Unspecified atrial fibrillation: Secondary | ICD-10-CM | POA: Insufficient documentation

## 2020-01-06 DIAGNOSIS — N183 Chronic kidney disease, stage 3 unspecified: Secondary | ICD-10-CM | POA: Diagnosis not present

## 2020-01-06 DIAGNOSIS — N281 Cyst of kidney, acquired: Secondary | ICD-10-CM | POA: Diagnosis not present

## 2020-01-06 DIAGNOSIS — IMO0002 Reserved for concepts with insufficient information to code with codable children: Secondary | ICD-10-CM

## 2020-01-06 DIAGNOSIS — E118 Type 2 diabetes mellitus with unspecified complications: Secondary | ICD-10-CM

## 2020-01-06 DIAGNOSIS — Z7984 Long term (current) use of oral hypoglycemic drugs: Secondary | ICD-10-CM | POA: Insufficient documentation

## 2020-01-06 DIAGNOSIS — K259 Gastric ulcer, unspecified as acute or chronic, without hemorrhage or perforation: Secondary | ICD-10-CM

## 2020-01-06 DIAGNOSIS — I129 Hypertensive chronic kidney disease with stage 1 through stage 4 chronic kidney disease, or unspecified chronic kidney disease: Secondary | ICD-10-CM | POA: Insufficient documentation

## 2020-01-06 DIAGNOSIS — K8592 Acute pancreatitis with infected necrosis, unspecified: Secondary | ICD-10-CM | POA: Diagnosis not present

## 2020-01-06 DIAGNOSIS — Z03818 Encounter for observation for suspected exposure to other biological agents ruled out: Secondary | ICD-10-CM | POA: Diagnosis not present

## 2020-01-06 DIAGNOSIS — R188 Other ascites: Secondary | ICD-10-CM | POA: Diagnosis not present

## 2020-01-06 DIAGNOSIS — R1030 Lower abdominal pain, unspecified: Secondary | ICD-10-CM | POA: Diagnosis not present

## 2020-01-06 HISTORY — DX: Acute pancreatitis without necrosis or infection, unspecified: K85.90

## 2020-01-06 LAB — COMPREHENSIVE METABOLIC PANEL
ALT: 12 U/L (ref 0–44)
AST: 18 U/L (ref 15–41)
Albumin: 3.3 g/dL — ABNORMAL LOW (ref 3.5–5.0)
Alkaline Phosphatase: 135 U/L — ABNORMAL HIGH (ref 38–126)
Anion gap: 14 (ref 5–15)
BUN: 31 mg/dL — ABNORMAL HIGH (ref 8–23)
CO2: 21 mmol/L — ABNORMAL LOW (ref 22–32)
Calcium: 8.9 mg/dL (ref 8.9–10.3)
Chloride: 98 mmol/L (ref 98–111)
Creatinine, Ser: 1.91 mg/dL — ABNORMAL HIGH (ref 0.44–1.00)
GFR calc Af Amer: 29 mL/min — ABNORMAL LOW (ref >60)
GFR calc non Af Amer: 25 mL/min — ABNORMAL LOW (ref >60)
Glucose, Bld: 252 mg/dL — ABNORMAL HIGH (ref 70–99)
Potassium: 3.7 mmol/L (ref 3.5–5.1)
Sodium: 133 mmol/L — ABNORMAL LOW (ref 135–145)
Total Bilirubin: 1.1 mg/dL (ref 0.3–1.2)
Total Protein: 6.9 g/dL (ref 6.5–8.1)

## 2020-01-06 LAB — CBC
HCT: 28.7 % — ABNORMAL LOW (ref 36.0–46.0)
Hemoglobin: 8.9 g/dL — ABNORMAL LOW (ref 12.0–15.0)
MCH: 27.3 pg (ref 26.0–34.0)
MCHC: 31 g/dL (ref 30.0–36.0)
MCV: 88 fL (ref 80.0–100.0)
Platelets: 180 10*3/uL (ref 150–400)
RBC: 3.26 MIL/uL — ABNORMAL LOW (ref 3.87–5.11)
RDW: 15.2 % (ref 11.5–15.5)
WBC: 16.8 10*3/uL — ABNORMAL HIGH (ref 4.0–10.5)
nRBC: 0 % (ref 0.0–0.2)

## 2020-01-06 LAB — URINALYSIS, COMPLETE (UACMP) WITH MICROSCOPIC
Bilirubin Urine: NEGATIVE
Glucose, UA: 150 mg/dL — AB
Hgb urine dipstick: NEGATIVE
Ketones, ur: NEGATIVE mg/dL
Leukocytes,Ua: NEGATIVE
Nitrite: NEGATIVE
Protein, ur: NEGATIVE mg/dL
Specific Gravity, Urine: 1.014 (ref 1.005–1.030)
pH: 5 (ref 5.0–8.0)

## 2020-01-06 LAB — RESPIRATORY PANEL BY RT PCR (FLU A&B, COVID)
Influenza A by PCR: NEGATIVE
Influenza B by PCR: NEGATIVE
SARS Coronavirus 2 by RT PCR: NEGATIVE

## 2020-01-06 LAB — GLUCOSE, CAPILLARY: Glucose-Capillary: 185 mg/dL — ABNORMAL HIGH (ref 70–99)

## 2020-01-06 LAB — LIPASE, BLOOD: Lipase: 27 U/L (ref 11–51)

## 2020-01-06 MED ORDER — METRONIDAZOLE IN NACL 5-0.79 MG/ML-% IV SOLN
500.0000 mg | Freq: Once | INTRAVENOUS | Status: AC
  Administered 2020-01-06: 500 mg via INTRAVENOUS
  Filled 2020-01-06: qty 100

## 2020-01-06 MED ORDER — LACTATED RINGERS IV SOLN: INTRAVENOUS | Status: DC

## 2020-01-06 MED ORDER — SODIUM CHLORIDE 0.9 % IV SOLN
500.0000 mg | Freq: Two times a day (BID) | INTRAVENOUS | Status: DC
  Filled 2020-01-06 (×2): qty 0.5

## 2020-01-06 MED ORDER — FLUTICASONE PROPIONATE 50 MCG/ACT NA SUSP
1.0000 | Freq: Every day | NASAL | Status: DC
  Filled 2020-01-06: qty 16

## 2020-01-06 MED ORDER — PIPERACILLIN-TAZOBACTAM 3.375 G IVPB 30 MIN: 3.3750 g | Freq: Once | INTRAVENOUS | Status: DC

## 2020-01-06 MED ORDER — AMIODARONE HCL 200 MG PO TABS
200.0000 mg | ORAL_TABLET | Freq: Every day | ORAL | Status: DC
  Filled 2020-01-06: qty 1

## 2020-01-06 MED ORDER — SUCRALFATE 1 GM/10ML PO SUSP
1.0000 g | Freq: Four times a day (QID) | ORAL | Status: DC
  Filled 2020-01-06 (×3): qty 10

## 2020-01-06 MED ORDER — INSULIN ASPART 100 UNIT/ML ~~LOC~~ SOLN: 0.0000 [IU] | Freq: Every day | SUBCUTANEOUS | Status: DC

## 2020-01-06 MED ORDER — PANTOPRAZOLE SODIUM 40 MG PO TBEC: 40.0000 mg | DELAYED_RELEASE_TABLET | Freq: Two times a day (BID) | ORAL | Status: DC

## 2020-01-06 MED ORDER — MORPHINE SULFATE (PF) 2 MG/ML IV SOLN
2.0000 mg | INTRAVENOUS | Status: DC | PRN
  Administered 2020-01-06: 2 mg via INTRAVENOUS
  Filled 2020-01-06: qty 1

## 2020-01-06 MED ORDER — LOPERAMIDE HCL 2 MG PO CAPS: 2.0000 mg | ORAL_CAPSULE | ORAL | Status: DC | PRN

## 2020-01-06 MED ORDER — SODIUM CHLORIDE 0.9 % IV SOLN: INTRAVENOUS | Status: DC

## 2020-01-06 MED ORDER — INSULIN ASPART 100 UNIT/ML ~~LOC~~ SOLN: 0.0000 [IU] | Freq: Three times a day (TID) | SUBCUTANEOUS | Status: DC

## 2020-01-06 MED ORDER — ONDANSETRON HCL 4 MG/2ML IJ SOLN
4.0000 mg | Freq: Once | INTRAMUSCULAR | Status: AC
  Administered 2020-01-06: 4 mg via INTRAVENOUS
  Filled 2020-01-06: qty 2

## 2020-01-06 MED ORDER — ONDANSETRON HCL 4 MG/2ML IJ SOLN: 4.0000 mg | Freq: Four times a day (QID) | INTRAMUSCULAR | Status: DC | PRN

## 2020-01-06 MED ORDER — SODIUM CHLORIDE 0.9 % IV SOLN
2.0000 g | Freq: Once | INTRAVENOUS | Status: AC
  Administered 2020-01-06: 2 g via INTRAVENOUS
  Filled 2020-01-06: qty 2

## 2020-01-06 MED ORDER — MORPHINE SULFATE (PF) 4 MG/ML IV SOLN
4.0000 mg | Freq: Once | INTRAVENOUS | Status: AC
  Administered 2020-01-06: 4 mg via INTRAVENOUS
  Filled 2020-01-06: qty 1

## 2020-01-06 MED ORDER — SODIUM CHLORIDE 0.9 % IV BOLUS
1000.0000 mL | Freq: Once | INTRAVENOUS | Status: AC
  Administered 2020-01-06: 1000 mL via INTRAVENOUS

## 2020-01-06 NOTE — ED Notes (Signed)
Pt took belongings: 2 jackets and cell phone with her to [facility].

## 2020-01-06 NOTE — ED Notes (Signed)
See triage note, pt states she was discharged from [facility] 2 days ago for pancreatitis. States she needs help for pain management to LLQ that radiates to back. States she is unable to eat, has not ate since yesterday morning.

## 2020-01-06 NOTE — Assessment & Plan Note (Signed)
Discussed need for additional imaging once stabilized.

## 2020-01-06 NOTE — ED Notes (Signed)
Report to [facility]

## 2020-01-06 NOTE — Assessment & Plan Note (Signed)
By EGD 12/2019. Continues PPI BID and carafate - they note some benefit when taking carafate.

## 2020-01-06 NOTE — Assessment & Plan Note (Addendum)
Present while in hospital, but worsening over the past 3 days. Elevated white count at ER on Wednesday. FTT at home with poor PO intake due to ongoing abd pain and nausea. Will need eval for diverticulitis, r/o other intra abdominal pathology. After some convincing she agrees to return to [facility] ER today. I spoke with the charge nurse asking her to expedite evaluation.  No fever, not consistent with SBP.

## 2020-01-06 NOTE — ED Provider Notes (Signed)
[facility] Emergency Department Provider Note  ____________________________________________   I have reviewed the triage vital signs and the nursing notes.   HISTORY  Chief Complaint Abdominal Pain   History limited by: Not Limited   HPI Aylanie Cubillos is a 74 y.o. female who presents to the emergency department today because of concern for continued and worsening abdominal pain. Patient had recent complicated admission to [facility] with discharge 3 days ago. She was admitted primarily for gallstone pancreatitis and states that today's pain is the same pain when she was in the hospital. The pain however has gotten worse since she has been discharged. During the hospitalization the patient did have stent placed with ERCP. She states she has not been able to contact the doctors that performed that procedure. Has been following up with her PCP who had recommended she return to [facility]. She went to that ER 2 days ago but left after waiting 6 hours. She states that in addition to the pain she has had decreased oral intake of solid food. Has been drinking water. She denies any dysuria or bad odor to her urine.   Records reviewed. Per medical record review patient has a history of recent and complicated hospitalization for gallstone pancreatitis, AKI, UTI amongst other medical problems. Did require hemodialysis during that admission.  Past Medical History:  Diagnosis Date  . Allergic rhinitis   . Arthritis   . 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 nephropathy ([place])    DSME at [facility] 01/2016   . Frequent epistaxis 05/16/2019   S/p cauterization with resolution 2020  . GERD (gastroesophageal reflux disease)   . Hepatic steatosis    by abd [place] 05/2012, mild transaminitis - normal iron sat and viral hep panel (2011), stable [place] 2017   . History of chicken pox   . History of measles   . History of recurrent UTIs    on chronic keflex  . HLD (hyperlipidemia)   . HTN (hypertension)   . 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 Kidney cyst, acquired 01/2016   L kidney by [place]  . Kidney stone 01/2016   L kidney by [place]  . Lung nodules 11/2013   overall stable on f/u CT 01/2016  . Osteopenia 06/2013   mild, forearm T -1.1, hip and spine WNL  . Polycythemia    mild, stable (2013)  . Primary localized osteoarthritis of right knee 01/05/2019  . Rosacea    metrogel    Patient Active Problem List   Diagnosis Date Noted  . Ovarian mass, right 01/06/2020  . Acquired renal cyst of left kidney 01/06/2020  . Gastric stress ulcer 01/06/2020  . Acute renal failure superimposed on stage 3a chronic kidney disease ([place]) 12/15/2019  . Choledocholithiasis   . Acute gallstone pancreatitis 12/13/2019  . Primary localized osteoarthritis of right knee 01/05/2019  . 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 Hx of adenomatous polyp of colon 12/15/2017  . Vulvar dermatitis 12/07/2017  . Stress due to illness of family member 08/18/2017  . Left sided abdominal pain 02/05/2017  . Right hip pain 08/07/2016  . CKD stage 3 due to type 2 diabetes mellitus ([place]) 04/04/2016  . Trigger ring finger of right hand 04/04/2016  . Pulmonary nodules 01/03/2016  . Advanced care planning/counseling discussion  07/05/2015  . Obesity, Class I, BMI 30-34.9 07/05/2015  . 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 Osteopenia 06/03/2013  . Medicare annual wellness visit, subsequent 05/31/2012  . Rotator cuff tendonitis, right 03/01/2012  . Polycythemia 12/02/2011  . Diabetes mellitus type 2, uncontrolled, with complications ([place])   . HTN (hypertension)   . Dyslipidemia associated with type 2 diabetes mellitus ([place])   . History of recurrent UTIs   . Allergic rhinitis   . GERD (gastroesophageal reflux  disease)   . Rosacea   . NAFLD (nonalcoholic fatty liver disease) 06/03/2010    Past Surgical History:  Procedure Laterality Date  . APPENDECTOMY  1987  . BILIARY STENT PLACEMENT  12/14/2019   Procedure: BILIARY STENT PLACEMENT;  Surgeon: [name], MD;  Location: [facility];  Service: Endoscopy;;  . BREAST BIOPSY Right 1963   benign  . BREAST BIOPSY Right 08/2014   benign- core  . cardiolite stress test  04/2004   normal  . CESAREAN SECTION  7588;3254   x2  . CHOLECYSTECTOMY  2003  . COLONOSCOPY  09/26/2008   adenomatous polyp, rpt 3-5 yrs  . COLONOSCOPY  08/2012   adenomatous polyps, diverticulosis, rec rpt 5 yrs [name])  . COLONOSCOPY WITH PROPOFOL N/A 02/05/2018   4TA, SSA, diverticulosis, rpt 3 yrs [name], [name], MD)  . dexa  2003   normal  . dexa  06/2013   [facility] - Tscore -1.1 forearm, normal spine and femur  . ERCP N/A 12/14/2019   Procedure: ENDOSCOPIC RETROGRADE CHOLANGIOPANCREATOGRAPHY (ERCP);  Surgeon: [name], MD;  Location: [facility];  Service: Endoscopy;  Laterality: N/A;  . ESOPHAGOGASTRODUODENOSCOPY N/A 12/19/2019   Procedure: ESOPHAGOGASTRODUODENOSCOPY (EGD);  Surgeon: [name], MD;  Location: [facility] ENDOSCOPY;  Service: Endoscopy;  Laterality: N/A;  . IR FLUORO GUIDE CV LINE RIGHT  12/27/2019  . IR REMOVAL TUN CV CATH W/O FL  01/03/2020  . SPHINCTEROTOMY  12/14/2019   Procedure: SPHINCTEROTOMY;  Surgeon: [name], MD;  Location: [place];  Service: Endoscopy;;  . 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 TRANSTHORACIC ECHOCARDIOGRAM  04/2019   EF 55-60%, modLVH, impaired relaxation   . TRIGGER FINGER RELEASE  2007;2010;2011   bilateral  . TRIGGER FINGER RELEASE  02/2017  . VAGINAL HYSTERECTOMY  1984   for menorrhagia, ovaries in place    Prior to Admission medications   Medication Sig Start Date End  Date Taking? Authorizing Provider  amiodarone (PACERONE) 200 MG tablet Take 1 tablet (200 mg total) by mouth daily. 01/04/20   Raiford Noble Latif, DO  amLODipine (NORVASC) 2.5 MG tablet Take 1 tablet (2.5 mg total) by mouth daily. 01/04/20   Sheikh, Omair Latif, DO  fluticasone (FLONASE) 50 MCG/ACT nasal spray Place 1 spray into both nostrils daily. 01/04/20   Raiford Noble Latif, DO  HYDROcodone-acetaminophen (NORCO/VICODIN) 5-325 MG tablet Take 1-2 tablets by mouth every 4 (four) hours as needed for moderate pain. 01/03/20   Raiford Noble Latif, DO  loperamide (IMODIUM) 2 MG capsule Take 1 capsule (2 mg total) by mouth as needed for diarrhea or loose stools. 01/03/20   Raiford Noble Latif, DO  multivitamin (RENA-VIT) TABS tablet Take 1 tablet by mouth at bedtime. 01/03/20   Raiford Noble Latif, DO  Nutritional Supplements (FEEDING SUPPLEMENT, NEPRO CARB STEADY,) LIQD Take 237 mLs by mouth 3 (three) times daily between meals. 01/03/20   Alfredia Ferguson,  Omair Latif, DO  ondansetron (ZOFRAN) 4 MG tablet Take 1 tablet (4 mg total) by mouth every 6 (six) hours as needed for nausea. 01/03/20   Raiford Noble Latif, DO  pantoprazole (PROTONIX) 40 MG tablet Take 1 tablet (40 mg total) by mouth 2 (two) times daily before a meal. 01/03/20   Sheikh, Omair Latif, DO  sucralfate (CARAFATE) 1 GM/10ML suspension Take 10 mLs (1 g total) by mouth every 6 (six) hours. 01/03/20   Raiford Noble Latif, DO    Allergies Azithromycin, Nickel, Sulfa antibiotics, and Adhesive [tape]  Family History  Problem Relation Age of Onset  . Stroke Mother        several  . Hyperlipidemia Mother   . Hypertension Mother   . Cancer Father        colon  . Hypertension Father   . Hyperlipidemia Father   . Coronary artery disease Father 70       MIx1, CABG  . Cancer Paternal Aunt        abdominal  . Coronary artery disease Maternal Grandmother   . Diabetes Maternal Grandfather   . Coronary artery disease Maternal Grandfather   . Breast cancer Neg Hx      Social History Social History   Tobacco Use  . Smoking status: Never Smoker  . Smokeless tobacco: Never Used  Substance Use Topics  . Alcohol use: Not Currently    Alcohol/week: 1.0 - 2.0 standard drinks    Types: 1 - 2 Glasses of wine per week    Comment: Occasional/1 mixed drink per month  . Drug use: No    Review of Systems Constitutional: No fever/chills Eyes: No visual changes. ENT: No sore throat. Cardiovascular: Denies chest pain. Respiratory: Denies shortness of breath. Gastrointestinal: Positive for abdominal pain. Positive for decreased oral intake.   Genitourinary: Negative for dysuria. Musculoskeletal: Negative for back pain. Skin: Negative for rash. Neurological: Negative for headaches, focal weakness or numbness.  ____________________________________________   PHYSICAL EXAM:  VITAL SIGNS: ED Triage Vitals  Enc Vitals Group     BP 01/06/20 1334 (!) 158/65     Pulse Rate 01/06/20 1334 (!) 115     Resp 01/06/20 1334 20     Temp 01/06/20 1334 98.4 F (36.9 C)     Temp Source 01/06/20 1334 Oral     SpO2 01/06/20 1334 95 %     Weight 01/06/20 1335 170 lb (77.1 kg)     Height 01/06/20 1335 5' 1" (1.549 m)     Head Circumference --      Peak Flow --      Pain Score 01/06/20 1334 10   Constitutional: Alert and oriented.  Eyes: Conjunctivae are normal.  ENT      Head: Normocephalic and atraumatic.      Nose: No congestion/rhinnorhea.      Mouth/Throat: Mucous membranes are moist.      Neck: No stridor. Hematological/Lymphatic/Immunilogical: No cervical lymphadenopathy. Cardiovascular: Normal rate, regular rhythm.  No murmurs, rubs, or gallops.  Respiratory: Normal respiratory effort without tachypnea nor retractions. Breath sounds are clear and equal bilaterally. No wheezes/rales/rhonchi. Gastrointestinal: Soft and tender to palpation in the lower abdomen. Slightly worse in the left lower abdomen.  Genitourinary: Deferred Musculoskeletal: Normal  range of motion in all extremities. No lower extremity edema. Neurologic:  Normal speech and language. No gross focal neurologic deficits are appreciated.  Skin:  Skin is warm, dry and intact. No rash noted. Psychiatric: Mood and affect are normal. Speech and  behavior are normal. Patient exhibits appropriate insight and judgment.  ____________________________________________    LABS (pertinent positives/negatives)  Lipase 27 CBC wbc 16.8, hgb 8.9, plt 180 CMP na 133, k 3.7, glu 252, cr 1.91, alk phos 135, t bili 1.1  ____________________________________________   EKG  None  ____________________________________________    RADIOLOGY  CT abd/pel Acute necrotizing pancreatitis collection with concern for intraabdominal abscess formation and possible hemorrhage.   I, Nance Pear, personally discussed these images and results by phone with the on-call radiologist and used this discussion as part of my medical decision making.   ____________________________________________   PROCEDURES  Procedures  ____________________________________________   INITIAL IMPRESSION / ASSESSMENT AND PLAN / ED COURSE  Pertinent labs & imaging results that were available during my care of the patient were reviewed by me and considered in my medical decision making (see chart for details).   Patient presented to the emergency department today with worsening abdominal pain in the setting of recent hospitalization for gallstone pancreatitis amongst many other medical problems.  On exam here she is tender in the abdomen.  She has developed a white count over the past few days.  Because of this a CT scan was obtained.  This was done without contrast given AKI.  I did speak directly with the patient about the results of her CT scan.  There is concerning for acute necrotizing pancreatitis with abscess formation some possible hemorrhage.  Given her recent and complicated hospital stay I do think patient  would benefit from being transferred back to [facility].  Discussed with [facility] who stated per policy to discuss with hospitalist here.  I discussed with Dr. Leslye Peer who will help arrange transfer.  ____________________________________________   FINAL CLINICAL IMPRESSION(S) / ED DIAGNOSES  Final diagnoses:  Acute necrotizing pancreatitis  Abdominal pain, unspecified abdominal location     Note: This dictation was prepared with Dragon dictation. Any transcriptional errors that result from this process are unintentional     Nance Pear, MD 01/06/20 1739

## 2020-01-06 NOTE — Patient Instructions (Addendum)
I do recommend going back to the ER today for uncontrolled severe abdominal pain, unable to take solid food, and elevated white count.

## 2020-01-06 NOTE — H&P (Signed)
[place] [facility] [place] NAME: Katherine Oliver    MR#:  [id number]  DATE OF BIRTH:  [birth year]  DATE OF ADMISSION:  01/06/2020  PRIMARY CARE PHYSICIAN: [name], MD   REQUESTING/REFERRING PHYSICIAN: Dr Charolett Bumpers  CHIEF COMPLAINT:   Chief Complaint  Patient presents with  . Abdominal Pain    HISTORY OF PRESENT ILLNESS:  Katherine Oliver  is a 74 y.o. female with a recent hospitalization over at [facility] for gallstone pancreatitis.  She did have an ERCP and stent on 12/14/2019.  Long hospitalization complicated by acute kidney injury requiring temporary dialysis, atrial fibrillation with rapid ventricular response.  She was recently discharged on 01/03/2020.  She developed severe abdominal pain while at home in her pancreas area.  10 out of 10 in intensity.  Morphine in the ER did help a little bit.  She has not been able to eat having nausea vomiting.  She is only able to drink water and soup.  Her abdomen is very distended.  No fever or chills.  Does have some nausea vomiting.  In the ER, a CT scan showed development of a large heterogeneous collection in the center of the pancreas replacing most of the pancreas parenchyma which could be consistent with acute necrotic collection, also several sites of higher attenuation material which could be hemorrhage, peritoneal and retroperitoneal phlegmonous changes, ascites, pleural effusions.  ER physician was trying to transfer back to [facility] but hospitalist were contacted here to help facilitate transfer process.  PAST MEDICAL HISTORY:   Past Medical History:  Diagnosis Date  . Allergic rhinitis   . Arthritis   . 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 nephropathy ([place])    DSME at [facility] 01/2016   . Frequent epistaxis 05/16/2019   S/p cauterization with  resolution 2020  . GERD (gastroesophageal reflux disease)   . Hepatic steatosis    by abd [place] 05/2012, mild transaminitis - normal iron sat and viral hep panel (2011), stable [place] 2017  . History of chicken pox   . History of measles   . History of recurrent UTIs    on chronic keflex  . HLD (hyperlipidemia)   . HTN (hypertension)   . 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 Kidney cyst, acquired 01/2016   L kidney by [place]  . Kidney stone 01/2016   L kidney by [place]  . Lung nodules 11/2013   overall stable on f/u CT 01/2016  . Osteopenia 06/2013   mild, forearm T -1.1, hip and spine WNL  . Pancreatitis   . Polycythemia    mild, stable (2013)  . Primary localized osteoarthritis of right knee 01/05/2019  . Rosacea    metrogel    PAST SURGICAL HISTORY:   Past Surgical History:  Procedure Laterality Date  . APPENDECTOMY  1987  . BILIARY STENT PLACEMENT  12/14/2019   Procedure: BILIARY STENT PLACEMENT;  Surgeon: [name], MD;  Location: [facility];  Service: Endoscopy;;  . BREAST BIOPSY Right 1963   benign  . BREAST BIOPSY Right 08/2014   benign- core  . cardiolite stress test  04/2004   normal  . CESAREAN SECTION  [id number];7654   x2  . CHOLECYSTECTOMY  2003  . COLONOSCOPY  09/26/2008   adenomatous polyp, rpt 3-5 yrs  .  COLONOSCOPY  08/2012   adenomatous polyps, diverticulosis, rec rpt 5 yrs [name])  . COLONOSCOPY WITH PROPOFOL N/A 02/05/2018   4TA, SSA, diverticulosis, rpt 3 yrs [name], [name], MD)  . dexa  2003   normal  . dexa  06/2013   [facility] - Tscore -1.1 forearm, normal spine and femur  . ERCP N/A 12/14/2019   Procedure: ENDOSCOPIC RETROGRADE CHOLANGIOPANCREATOGRAPHY (ERCP);  Surgeon: [name], MD;  Location: [facility];  Service: Endoscopy;  Laterality: N/A;  . ESOPHAGOGASTRODUODENOSCOPY N/A 12/19/2019   Procedure: ESOPHAGOGASTRODUODENOSCOPY (EGD);  Surgeon: [name], MD;  Location: [facility] ENDOSCOPY;  Service: Endoscopy;  Laterality:  N/A;  . IR FLUORO GUIDE CV LINE RIGHT  12/27/2019  . IR REMOVAL TUN CV CATH W/O FL  01/03/2020  . SPHINCTEROTOMY  12/14/2019   Procedure: SPHINCTEROTOMY;  Surgeon: [name], MD;  Location: [facility];  Service: Endoscopy;;  . 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 TRANSTHORACIC ECHOCARDIOGRAM  04/2019   EF 55-60%, modLVH, impaired relaxation   . TRIGGER FINGER RELEASE  2007;2010;2011   bilateral  . TRIGGER FINGER RELEASE  02/2017  . VAGINAL HYSTERECTOMY  1984   for menorrhagia, ovaries in place    SOCIAL HISTORY:   Social History   Tobacco Use  . Smoking status: Never Smoker  . Smokeless tobacco: Never Used  Substance Use Topics  . Alcohol use: Not Currently    Alcohol/week: 1.0 - 2.0 standard drinks    Types: 1 - 2 Glasses of wine per week    Comment: Occasional/1 mixed drink per month    FAMILY HISTORY:   Family History  Problem Relation Age of Onset  . Stroke Mother        several  . Hyperlipidemia Mother   . Hypertension Mother   . Cancer Father        colon  . Hypertension Father   . Hyperlipidemia Father   . Coronary artery disease Father 66       MIx1, CABG  . Cancer Paternal Aunt        abdominal  . Coronary artery disease Maternal Grandmother   . Diabetes Maternal Grandfather   . Coronary artery disease Maternal Grandfather   . Breast cancer Neg Hx     DRUG ALLERGIES:   Allergies  Allergen Reactions  . Azithromycin Itching    Okay if takes benadryl along with it  . Nickel     Reaction to cheap earrings  . Sulfa Antibiotics   . Adhesive [Tape] Rash    Paper tape - blisters    REVIEW OF SYSTEMS:  CONSTITUTIONAL: No fever.  Positive for chills.  Positive for fatigue.  EYES: No blurred or double vision.  Wears glasses. EARS, NOSE, AND THROAT: No tinnitus or ear pain. No sore throat.  RESPIRATORY: No cough,  shortness of breath, wheezing or hemoptysis.  CARDIOVASCULAR: No chest pain, orthopnea, edema.  GASTROINTESTINAL: Positive for nausea, vomiting, and abdominal pain. No blood in bowel movements GENITOURINARY: No dysuria, hematuria.  ENDOCRINE: No polyuria, nocturia,  HEMATOLOGY: No anemia, easy bruising or bleeding SKIN: No rash or lesion. MUSCULOSKELETAL: Positive for back pain NEUROLOGIC: No tingling, numbness, weakness.  PSYCHIATRY: No anxiety or depression.   MEDICATIONS AT HOME:   Prior to Admission medications   Medication Sig Start Date End Date Taking? Authorizing Provider  amiodarone (PACERONE) 200 MG tablet Take 1 tablet (200 mg  total) by mouth daily. 01/04/20   Raiford Noble Latif, DO  amLODipine (NORVASC) 2.5 MG tablet Take 1 tablet (2.5 mg total) by mouth daily. 01/04/20   Sheikh, Omair Latif, DO  fluticasone (FLONASE) 50 MCG/ACT nasal spray Place 1 spray into both nostrils daily. 01/04/20   Raiford Noble Latif, DO  HYDROcodone-acetaminophen (NORCO/VICODIN) 5-325 MG tablet Take 1-2 tablets by mouth every 4 (four) hours as needed for moderate pain. 01/03/20   Raiford Noble Latif, DO  loperamide (IMODIUM) 2 MG capsule Take 1 capsule (2 mg total) by mouth as needed for diarrhea or loose stools. 01/03/20   Raiford Noble Latif, DO  multivitamin (RENA-VIT) TABS tablet Take 1 tablet by mouth at bedtime. 01/03/20   Raiford Noble Latif, DO  Nutritional Supplements (FEEDING SUPPLEMENT, NEPRO CARB STEADY,) LIQD Take 237 mLs by mouth 3 (three) times daily between meals. 01/03/20   Sheikh, Omair Latif, DO  ondansetron (ZOFRAN) 4 MG tablet Take 1 tablet (4 mg total) by mouth every 6 (six) hours as needed for nausea. 01/03/20   Raiford Noble Latif, DO  pantoprazole (PROTONIX) 40 MG tablet Take 1 tablet (40 mg total) by mouth 2 (two) times daily before a meal. 01/03/20   Sheikh, Omair Latif, DO  sucralfate (CARAFATE) 1 GM/10ML suspension Take 10 mLs (1 g total) by mouth every 6 (six) hours. 01/03/20   Sheikh, Georgina Quint  Latif, DO      VITAL SIGNS:  Blood pressure (!) 149/67, pulse (!) 112, temperature 98.4 F (36.9 C), temperature source Oral, resp. rate 20, height 5\' 1"  (1.549 m), weight 77.1 kg, SpO2 94 %.  PHYSICAL EXAMINATION:  GENERAL:  74 y.o.-year-old patient lying in the bed with no acute distress.  EYES: Pupils equal, round, reactive to light and accommodation. No scleral icterus. Extraocular muscles intact.  HEENT: Head atraumatic, normocephalic. Oropharynx and nasopharynx clear.  NECK:  Supple, no jugular venous distention. No thyroid enlargement, no tenderness.  LUNGS: Normal breath sounds bilaterally, no wheezing, rales,rhonchi or crepitation. No use of accessory muscles of respiration.  CARDIOVASCULAR: S1, S2 normal. No murmurs, rubs, or gallops.  ABDOMEN: Soft, generalized tenderness, distended. Bowel sounds present. No organomegaly or mass.  EXTREMITIES: No pedal edema, cyanosis, or clubbing.  NEUROLOGIC: Cranial nerves II through XII are intact. Muscle strength 5/5 in all extremities. Sensation intact. Gait not checked.  PSYCHIATRIC: The patient is alert and oriented x 3.  SKIN: No rash, lesion, or ulcer.   LABORATORY PANEL:   CBC Recent Labs  Lab 01/06/20 1339  WBC 16.8*  HGB 8.9*  HCT 28.7*  PLT 180   ------------------------------------------------------------------------------------------------------------------  Chemistries  Recent Labs  Lab 01/03/20 1215 01/04/20 1734 01/06/20 1339  NA 137   < > 133*  K 3.3*   < > 3.7  CL 101   < > 98  CO2 23   < > 21*  GLUCOSE 162*   < > 252*  BUN 40*   < > 31*  CREATININE 2.94*   < > 1.91*  CALCIUM 8.8*   < > 8.9  MG 1.5*  --   --   AST 18   < > 18  ALT 10   < > 12  ALKPHOS 108   < > 135*  BILITOT 0.8   < > 1.1   < > = values in this interval not displayed.   ------------------------------------------------------------------------------------------------------------------   RADIOLOGY:  CT ABDOMEN PELVIS WO  CONTRAST  Result Date: 01/06/2020 CLINICAL DATA:  Lower abdominal pain increasing over the past  several days EXAM: CT ABDOMEN AND PELVIS WITHOUT CONTRAST TECHNIQUE: Multidetector CT imaging of the abdomen and pelvis was performed following the standard protocol without IV contrast. COMPARISON:  CT abdomen pelvis 12/13/2019 FINDINGS: Lower chest: Bilateral pleural effusions, right greater than left with adjacent passive atelectasis in the lung bases. Cardiac size is within normal limits. Extensive coronary artery calcifications noted. Calcifications also present on the aortic leaflets. Trace pericardial effusion is within physiologic normal. Hepatobiliary: There is pneumobilia within the anti dependent portions of the left lobe liver. Postsurgical changes from prior cholecystectomy. A common bile duct stent is in place, displaced to the right by the large pancreatic fluid collections detailed below. No gross intra or hepatic or extrahepatic ductal dilatation is seen. Distal tip of this biliary stent appears positioned within the duodenal lumen though the bowel is largely collapsed secondary to mass effect as well. Pancreas: An interval development of a large heterogeneous collection centered upon the pancreas, replacing much of the pancreatic parenchyma. Given configuration, difficult to accurately measure though there is a maximal AP thickness of up to 7 cm with medial-lateral extent of 16 cm and a craniocaudal extent of 8 cm (coronal 5/40). Several sites of higher attenuation material within this collection are worrisome for acute hemorrhage given the attenuation on this noncontrast study (5/36). Extensive severe intra peritoneal and retroperitoneal phlegmonous changes are present as well as a separate intraperitoneal rim enhancing collection extending inferolaterally within the central mesentery measuring 5.6 x 2.5 x 3.1 cm (5/36, 6/47). Spleen: Normal in size without focal abnormality. Adrenals/Urinary Tract:  Normal adrenal glands. Stable exophytic cystic lesion arising from the lower pole left kidney demonstrating increased attenuation on prior contrast enhanced study. No concerning renal masses, urolithiasis or hydronephrosis. Mild bilateral symmetric perinephric stranding, a nonspecific which could be reactive or correlate with diminished renal function, advanced patient age, or infection. Gas is noted within the urinary bladder, correlate for recent instrumentation. Stomach/Bowel: Distal esophagus is unremarkable. Some gastric wall thickening is likely reactive due to inflammation in the lesser sac. Lateral displacement and compression of the duodenum by the mass effect from the pancreatic collection detailed above. No other small bowel dilatation or wall thickening. The appendix is poorly visualized but without focal pericecal inflammation to suggest acute appendicitis. No colonic dilatation or wall thickening. Scattered colonic diverticula without focal pericolonic inflammation to suggest diverticulitis. Vascular/Lymphatic: Atherosclerotic plaque within the normal caliber aorta. Evaluation of the vasculature is limited in the absence of contrast media. Reactive adenopathy throughout the abdomen and pelvis. No pathologically enlarged nodes. Reproductive: Uterus is surgically absent. No concerning adnexal lesions. Other: Peripancreatic in mid abdominal fluid collections as detailed above. Small volume simple attenuation ascites noted in the perihepatic space and perisplenic spaces as well as layering in the deep pelvis. No free intraperitoneal air is seen at this time. Musculoskeletal: Multilevel degenerative changes are present in the imaged portions of the spine. No acute osseous abnormality or suspicious osseous lesion. IMPRESSION: 1. Interval development of a large heterogeneous collection centered upon the pancreas, replacing much of the pancreatic parenchyma. Given time course from initial onset pancreatitis  symptoms (12/13/2019) findings are compatible with an acute necrotic collection as per the revised [facility] classification of acute pancreatitis. 2. Several sites of higher attenuation material within this collection are worrisome for acute hemorrhage given the attenuation on this noncontrast study consider correlation with CBC. 3. Extensive intra peritoneal and retroperitoneal phlegmonous changes are present as well as a separate intraperitoneal rim enhancing collection within the central mesentery  measuring 5.6 x 2.5 x 3.1 cm. 4. Small volume simple attenuation ascites. No free intraperitoneal air is seen at this time. 5. Bilateral pleural effusions, right greater than left with adjacent passive atelectasis in the lung bases. 6. Gas within the urinary bladder, correlate for recent instrumentation. 7. Stable exophytic lesion arising from the lower pole left kidney. Previously demonstrating central enhancement on comparison contrast-enhanced study. Consider outpatient MRI when patient is better able to tolerate the study. 8. Aortic Atherosclerosis (ICD10-I70.0). These results were called by telephone at the time of interpretation on 01/06/2020 at 4:41 pm to provider [facility] , who verbally acknowledged these results. Electronically Signed   By: Lovena Le M.D.   On: 01/06/2020 16:41    EKG:   Ordered by me  IMPRESSION AND PLAN:   1.  Severe necrotizing pancreatitis, ascites, phlegmon.  Currently the patient is hemodynamically stable.  Will start IV fluids.  Empiric meropenem for now.  ER physician already ordered Flagyl and Maxipime.  Called [facility] to set up transfer to [facility].  Will likely end up needing interventional radiology procedure and gastroenterology consultations at a tertiary care center.  Lipase is not actually that elevated. 2.  Acute kidney injury which continues to improve.  Baseline chronic kidney disease stage IIIa.  IV fluid should help.  3.  Paroxysmal atrial  fibrillation.  We will get admission EKG.  Continue amiodarone.  Hold on anticoagulation at this point. 4.  Essential hypertension.  Considering the patient has severe pancreatitis I will hold antihypertensive medications at this time 5.  GERD on Carafate and Protonix 6.  Impaired fasting glucose.  Last hemoglobin A1c 6.4.  We will put on sliding scale insulin at this point. 7.  Anemia.  Add on iron studies.  Continue to watch hemoglobin closely since CT scan could have a collection of blood.   All the records are reviewed and case discussed with ED provider. Management plans discussed with the patient, family (husband) and they are in agreement.  Case discussed with [facility] to transfer the [facility].  Please call admitting team once arrives at [facility].  CODE STATUS: Full code  TOTAL TIME TAKING CARE OF THIS PATIENT: 60 minutes.    Loletha Grayer M.D on 01/06/2020 at 5:31 PM  Between 7am to 6pm - Pager - [phone]  After 6pm call admission pager [phone]  [facility]  CC: Primary care physician; [name], MD

## 2020-01-06 NOTE — ED Notes (Signed)
emtala checked by charge RN.

## 2020-01-06 NOTE — Assessment & Plan Note (Addendum)
Was treated with macrobid prior to latest hospitalization. Ppx abx currently on hold.

## 2020-01-06 NOTE — ED Notes (Signed)
Report to [place] RN [place]

## 2020-01-06 NOTE — Patient Outreach (Signed)
[facility]) Care Management  01/06/2020  Kamyla Olejnik [facility] August 11, 1946 [id number]   CSW has made several attempts to try and contact patient today, to perform the initial phone assessment, as well as assess and assist with social work needs and services, without success.  CSW left a HIPAA compliant message for patient on her home phone number, as well as her cell phone number, and is currently awaiting a return call.  Patient's husband, Mirenda Baltazar called the main office, speaking with Arville Care, Care Management Assistant with [facility], requesting that CSW contact him on his cell phone [phone]); however, Mr. Paget was also unavailable at the time of CSW's call.  CSW was unable to leave a HIPAA compliant message on voicemail for Mr. Dumire, as CSW received an automated recording indicating that his mailbox has not been set up.  CSW noted that patient presented to the Emergency Department at [facility] today, complaining of severe left lower quadrant pain.  CSW will make a second outreach attempt within the next 3-4 business days, if a return call is not received from patient in the meantime.  CSW will also mail an Outreach Letter to patient's home requesting that patient contact CSW if patient is interested in receiving social work services through [facility] with Scientist, clinical (histocompatibility and immunogenetics).  Nat Christen, BSW, MSW, LCSW  Licensed Education officer, environmental [facility]  Mailing [place] [address], [place], [place] [address] Physical Address-[address], [place], [place] [address] Toll Free Main # [phone] Fax # [phone] Cell # [phone]  Office # [phone] [email or web address]

## 2020-01-06 NOTE — ED Triage Notes (Signed)
Patient presents to the ED with left lower quadrant pain that has been increasing over the past several days.  Patient states she is unable to eat but can drink small amounts of fluid.  Patient was discharged from the hospital on March 2nd for pancreatitis and kidney failure.  Patient reports pain is severe.

## 2020-01-06 NOTE — Assessment & Plan Note (Signed)
Needed CRRT during latest hospitalization.  Cr actually continuing trending down on latest check (to 2.6 at ER this week).  Will need close renal f/u.

## 2020-01-06 NOTE — Telephone Encounter (Signed)
Pt has hospital f/u today at 11:30.

## 2020-01-06 NOTE — ED Notes (Signed)
Pt assisted to restroom standby assist. NAD noted

## 2020-01-06 NOTE — Assessment & Plan Note (Signed)
Discussed need for further evaluation once stabilized.

## 2020-01-06 NOTE — Assessment & Plan Note (Addendum)
Improved with recent biliary stent placement.  Lipase this week was normal.  Was planning to f/u with GI in outpatient setting to discuss rpt ERCP.

## 2020-01-06 NOTE — ED Notes (Signed)
Pharmacy contacted to send Maxipime d/t pyxis being out.

## 2020-01-06 NOTE — Progress Notes (Signed)
Pharmacy Antibiotic Note  Katherine Oliver is a 74 y.o. female admitted on 01/06/2020 with Intra-Abdominal.  Pharmacy has been consulted for Meropenem dosing.  Plan: Meropenem 500mg  IV q12h  Height: 5\' 1"  (154.9 cm) Weight: 170 lb (77.1 kg) IBW/kg (Calculated) : 47.8  Temp (24hrs), Avg:98.2 F (36.8 C), Min:97.9 F (36.6 C), Max:98.4 F (36.9 C)  Recent Labs  Lab 01/01/20 0334 01/02/20 0340 01/03/20 1215 01/04/20 1734 01/06/20 1339  WBC 7.1 8.1 8.2 15.2* 16.8*  CREATININE 4.41* 3.84* 2.94* 2.65* 1.91*    Estimated Creatinine Clearance: 23.9 mL/min (A) (by C-G formula based on SCr of 1.91 mg/dL (H)).    Allergies  Allergen Reactions  . Azithromycin Itching    Okay if takes benadryl along with it  . Nickel     Reaction to cheap earrings  . Sulfa Antibiotics   . Adhesive [Tape] Rash    Paper tape - blisters    Antimicrobials this admission: Cefepime 3/5 x 1 Metronidazole 3/5 x 1 Meropenem 3/5 >>  Dose adjustments this admission:  Microbiology results:  Thank you for allowing pharmacy to be a part of this patient's care.  Paulina Fusi, PharmD, BCPS 01/06/2020 7:39 PM

## 2020-01-06 NOTE — ED Notes (Signed)
Pt to CT at this time.

## 2020-01-06 NOTE — Assessment & Plan Note (Signed)
Wonderful control prior to latest hospitalization, now sugars uncontrolled. Metformin on hold currently. Will likely need to start basal insulin, but after abd pain evaluation.

## 2020-01-06 NOTE — Progress Notes (Addendum)
This visit was conducted in person.  BP 140/78 (BP Location: Left Arm, Patient Position: Sitting, Cuff Size: Normal)   Pulse (!) 116   Temp 97.9 F (36.6 C) (Temporal)   Ht 5' 0.75" (1.543 m)   SpO2 93%   BMI 35.28 kg/m    CC: hosp f/u visit Subjective:    Patient ID: Katherine Oliver, female    DOB: [birth year], 74 y.o.   MRN: [id number]  HPI: Katherine Oliver is a 74 y.o. female presenting on 01/06/2020 for Hospitalization Follow-up (C/o abd pain, low back pain and nausea. Pt accompanied by husband, [name]- temp 97.9.)    Recent prolonged hospitalization for gallstone pancreatitis and UTI s/p ERCP on 12/14/2019 with stent placement complicated by deterioration in status with new atrial fibrillation with RVR (started on diltiazem - changed to amiodarone due to hypotension) and AKI leading to CRRT through tunnel catheter. Concern for GI bleed, EGD 12/19/2019 showed stress ulcerations. Placed on PPI BID and carafate QID. She received total 2-3 units pRBC on 01/01/2020. Tunnel catheter removed 01/03/2020 with plan for close renal f/u. Metformin has been placed on hold, plan to discharge on insulin but without teaching.   Since home, not doing well. Ongoing abd pain describes 8-10/10 pain, nausea, vomiting. Drinking small sips of water but unable to keep solids down due to abd pain and vomiting. We actually referred her back to the ER on 01/04/2020, but she left without being seen after waiting 6 hours. WBC was elevated to 15 at ER, other labs were stable.   Stays very winded with short walks. Using walker at home.  Cough present - unchanged over several weeks, largely unproductive.  No fevers/chills. Normal bowel movements, last one was this morning. Passing gas ok.  Decreased urination since AKI, but without dysuria, urgency or frequency or other UTI symptoms.   [name] involved - needs SN for insulin teaching.  She has completed Pfizer covid vaccines (last on  12/08/2019)   Admit date: 12/13/2019 Discharge date: 01/03/2020 TCM hosp f/u phone call performed on 01/04/2020  Admitted From: Home Disposition: Home with home health  Recommendations for Outpatient Follow-up:  1. Follow up with PCP in 1-2 weeks 2. Follow-up with nephrology in 1 to 2 weeks 3. Follow-up with cardiology within 1 to 2 weeks and discuss about continuing amiodarone 4. Follow-up with gastroenterology in 2 to 4 weeks to discuss biliary stent and repeat ERCP 5. Follow-up as outpatient for work-up for her neck swelling on the right 6. Please obtain CBCs, CMP, mag, Phos in one week 7. 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, without long-term current use of insulin (HCC)   HTN (hypertension)   Dyslipidemia associated with type 2 diabetes mellitus (HCC)   NAFLD (nonalcoholic fatty liver disease)   GERD (gastroesophageal reflux disease)   Polycythemia   Obesity, Class I, BMI 30-34.9   CKD stage 3 due to type 2 diabetes mellitus ([place])   Acute gallstone pancreatitis   Sepsis ([place])   Acute renal failure (ARF) ([place])   Acute respiratory failure ([place])   Choledocholithiasis  Home Health: Yes Equipment/Devices: None recommended by PT Discharge Condition: Stable CODE STATUS: Full code Diet recommendation: Heart healthy carb modified diet     Relevant past medical, surgical, family and social history reviewed and updated as indicated. Interim medical history since our last visit reviewed. Allergies and medications reviewed and updated.  Outpatient Medications Prior to Visit  Medication Sig Dispense Refill  . amiodarone (PACERONE) 200 MG tablet Take 1 tablet (200 mg total) by mouth daily. 30 tablet 0  . amLODipine (NORVASC) 2.5 MG tablet Take 1 tablet (2.5 mg total) by mouth daily. 30 tablet 0  . fluticasone (FLONASE) 50 MCG/ACT nasal spray Place 1 spray into both  nostrils daily. 9.9 mL 2  . HYDROcodone-acetaminophen (NORCO/VICODIN) 5-325 MG tablet Take 1-2 tablets by mouth every 4 (four) hours as needed for moderate pain. 10 tablet 0  . loperamide (IMODIUM) 2 MG capsule Take 1 capsule (2 mg total) by mouth as needed for diarrhea or loose stools. 30 capsule 0  . multivitamin (RENA-VIT) TABS tablet Take 1 tablet by mouth at bedtime. 30 tablet 0  . Nutritional Supplements (FEEDING SUPPLEMENT, NEPRO CARB STEADY,) LIQD Take 237 mLs by mouth 3 (three) times daily between meals. 1000 mL 0  . ondansetron (ZOFRAN) 4 MG tablet Take 1 tablet (4 mg total) by mouth every 6 (six) hours as needed for nausea. 20 tablet 0  . pantoprazole (PROTONIX) 40 MG tablet Take 1 tablet (40 mg total) by mouth 2 (two) times daily before a meal. 60 tablet 0  . sucralfate (CARAFATE) 1 GM/10ML suspension Take 10 mLs (1 g total) by mouth every 6 (six) hours. 420 mL 0   No facility-administered medications prior to visit.     Per HPI unless specifically indicated in ROS section below Review of Systems Objective:    BP 140/78 (BP Location: Left Arm, Patient Position: Sitting, Cuff Size: Normal)   Pulse (!) 116   Temp 97.9 F (36.6 C) (Temporal)   Ht 5' 0.75" (1.543 m)   SpO2 93%   BMI 35.28 kg/m   Wt Readings from Last 3 Encounters:  01/06/20 170 lb (77.1 kg)  12/28/19 185 lb 3 oz (84 kg)  12/12/19 171 lb (77.6 kg)    Physical Exam Vitals and nursing note reviewed.  Constitutional:      General: She is in acute distress.     Appearance: Normal appearance. She is obese. She is ill-appearing. She is not diaphoretic.     Comments: In wheelchair  Cardiovascular:     Rate and Rhythm: Regular rhythm. Tachycardia present.     Pulses: Normal pulses.     Heart sounds: Normal heart sounds. No murmur.     Comments: Sounds regular but tachycardic on exam Pulmonary:     Effort: Pulmonary effort is normal. No respiratory distress.     Breath sounds: Normal breath sounds. No  wheezing, rhonchi or rales.  Abdominal:     General: Abdomen is protuberant. Bowel sounds are increased. There is distension.     Palpations: There is no fluid wave.     Tenderness: There is abdominal tenderness (moderate to severe) in the epigastric area, left upper quadrant and left lower quadrant. There is guarding. There is no right CVA tenderness or left CVA tenderness. Negative signs include Murphy's sign.     Hernia: No hernia is present.  Musculoskeletal:     Right lower leg: No edema.     Left lower leg: No edema.  Skin:    Findings: No rash.  Neurological:     Mental Status: She is alert.  Psychiatric:        Mood and Affect: Mood normal.        Behavior: Behavior normal.       Results for orders placed or performed during the hospital encounter of  01/04/20  Lipase, blood  Result Value Ref Range   Lipase 35 11 - 51 U/L  Comprehensive metabolic panel  Result Value Ref Range   Sodium 134 (L) 135 - 145 mmol/L   Potassium 4.0 3.5 - 5.1 mmol/L   Chloride 100 98 - 111 mmol/L   CO2 21 (L) 22 - 32 mmol/L   Glucose, Bld 315 (H) 70 - 99 mg/dL   BUN 38 (H) 8 - 23 mg/dL   Creatinine, Ser 2.65 (H) 0.44 - 1.00 mg/dL   Calcium 8.8 (L) 8.9 - 10.3 mg/dL   Total Protein 5.8 (L) 6.5 - 8.1 g/dL   Albumin 2.8 (L) 3.5 - 5.0 g/dL   AST 23 15 - 41 U/L   ALT 13 0 - 44 U/L   Alkaline Phosphatase 112 38 - 126 U/L   Total Bilirubin 0.7 0.3 - 1.2 mg/dL   GFR calc non Af Amer 17 (L) >60 mL/min   GFR calc Af Amer 20 (L) >60 mL/min   Anion gap 13 5 - 15  CBC  Result Value Ref Range   WBC 15.2 (H) 4.0 - 10.5 K/uL   RBC 3.40 (L) 3.87 - 5.11 MIL/uL   Hemoglobin 9.6 (L) 12.0 - 15.0 g/dL   HCT 30.4 (L) 36.0 - 46.0 %   MCV 89.4 80.0 - 100.0 fL   MCH 28.2 26.0 - 34.0 pg   MCHC 31.6 30.0 - 36.0 g/dL   RDW 15.6 (H) 11.5 - 15.5 %   Platelets 191 150 - 400 K/uL   nRBC 0.0 0.0 - 0.2 %  Urinalysis, Routine w reflex microscopic  Result Value Ref Range   Color, Urine YELLOW YELLOW   APPearance  CLEAR CLEAR   Specific Gravity, Urine 1.013 1.005 - 1.030   pH 5.0 5.0 - 8.0   Glucose, UA >=500 (A) NEGATIVE mg/dL   Hgb urine dipstick NEGATIVE NEGATIVE   Bilirubin Urine NEGATIVE NEGATIVE   Ketones, ur NEGATIVE NEGATIVE mg/dL   Protein, ur NEGATIVE NEGATIVE mg/dL   Nitrite NEGATIVE NEGATIVE   Leukocytes,Ua NEGATIVE NEGATIVE   RBC / HPF 0-5 0 - 5 RBC/hpf   WBC, UA 0-5 0 - 5 WBC/hpf   Bacteria, UA RARE (A) NONE SEEN   Squamous Epithelial / LPF 0-5 0 - 5   Hyaline Casts, UA PRESENT    Assessment & Plan:  This visit occurred during the SARS-CoV-2 public health emergency.  Safety protocols were in place, including screening questions prior to the visit, additional usage of staff PPE, and extensive cleaning of exam room while observing appropriate contact time as indicated for disinfecting solutions.   Problem List Items Addressed This Visit    Ovarian mass, right    Discussed need for additional imaging once stabilized.       Left sided abdominal pain - Primary    Present while in hospital, but worsening over the past 3 days. Elevated white count at ER on Wednesday. FTT at home with poor PO intake due to ongoing abd pain and nausea. Will need eval for diverticulitis, r/o other intra abdominal pathology. After some convincing she agrees to return to [facility] ER today. I spoke with the charge nurse asking her to expedite evaluation.  No fever, not consistent with SBP.       History of recurrent UTIs    Was treated with macrobid prior to latest hospitalization. Ppx abx currently on hold.       Gastric stress ulcer    By EGD 12/2019.  Continues PPI BID and carafate - they note some benefit when taking carafate.       Diabetes mellitus type 2, uncontrolled, with complications ([place])    Wonderful control prior to latest hospitalization, now sugars uncontrolled. Metformin on hold currently. Will likely need to start basal insulin, but after abd pain evaluation.       Choledocholithiasis    Acute renal failure superimposed on stage 3a chronic kidney disease ([place])    Needed CRRT during latest hospitalization.  Cr actually continuing trending down on latest check (to 2.6 at ER this week).  Will need close renal f/u.       Acute gallstone pancreatitis    Improved with recent biliary stent placement.  Lipase this week was normal.  Was planning to f/u with GI in outpatient setting to discuss rpt ERCP.       Acquired renal cyst of left kidney    Discussed need for further evaluation once stabilized.        Other Visit Diagnoses    Cough       Relevant Orders   DG Chest 2 View       No orders of the defined types were placed in this encounter.  Orders Placed This Encounter  Procedures  . DG Chest 2 View    Standing Status:   Future    Number of Occurrences:   1    Standing Expiration Date:   03/07/2021    Order Specific Question:   Reason for Exam (SYMPTOM  OR DIAGNOSIS REQUIRED)    Answer:   cough, dyspnea    Order Specific Question:   Preferred imaging location?    Answer:   [facility]    Order Specific Question:   Radiology Contrast Protocol - do NOT remove file path    Answer:   [email or web address]   Patient Instructions  I do recommend going back to the ER today for uncontrolled severe abdominal pain, unable to take solid food, and elevated white count.    Follow up plan: No follow-ups on file.  [name], MD

## 2020-01-07 ENCOUNTER — Inpatient Hospital Stay (HOSPITAL_COMMUNITY)
Admission: AD | Admit: 2020-01-07 | Discharge: 2020-01-15 | DRG: 438 | Disposition: A | Discharge status: Other Acute Inpatient Hospital | Payer: Medicare Other | Source: Other Acute Inpatient Hospital | Attending: Internal Medicine | Admitting: Internal Medicine

## 2020-01-07 DIAGNOSIS — E872 Acidosis: Secondary | ICD-10-CM | POA: Diagnosis not present

## 2020-01-07 DIAGNOSIS — Z7189 Other specified counseling: Secondary | ICD-10-CM | POA: Diagnosis not present

## 2020-01-07 DIAGNOSIS — D62 Acute posthemorrhagic anemia: Secondary | ICD-10-CM | POA: Diagnosis not present

## 2020-01-07 DIAGNOSIS — I10 Essential (primary) hypertension: Secondary | ICD-10-CM | POA: Diagnosis present

## 2020-01-07 DIAGNOSIS — Z881 Allergy status to other antibiotic agents status: Secondary | ICD-10-CM

## 2020-01-07 DIAGNOSIS — K920 Hematemesis: Secondary | ICD-10-CM | POA: Diagnosis not present

## 2020-01-07 DIAGNOSIS — E1121 Type 2 diabetes mellitus with diabetic nephropathy: Secondary | ICD-10-CM | POA: Diagnosis present

## 2020-01-07 DIAGNOSIS — N179 Acute kidney failure, unspecified: Secondary | ICD-10-CM | POA: Diagnosis present

## 2020-01-07 DIAGNOSIS — I81 Portal vein thrombosis: Secondary | ICD-10-CM | POA: Diagnosis not present

## 2020-01-07 DIAGNOSIS — Z809 Family history of malignant neoplasm, unspecified: Secondary | ICD-10-CM | POA: Diagnosis not present

## 2020-01-07 DIAGNOSIS — K8511 Biliary acute pancreatitis with uninfected necrosis: Principal | ICD-10-CM | POA: Diagnosis present

## 2020-01-07 DIAGNOSIS — Z4659 Encounter for fitting and adjustment of other gastrointestinal appliance and device: Secondary | ICD-10-CM | POA: Diagnosis not present

## 2020-01-07 DIAGNOSIS — K760 Fatty (change of) liver, not elsewhere classified: Secondary | ICD-10-CM | POA: Diagnosis present

## 2020-01-07 DIAGNOSIS — E440 Moderate protein-calorie malnutrition: Secondary | ICD-10-CM | POA: Diagnosis present

## 2020-01-07 DIAGNOSIS — R109 Unspecified abdominal pain: Secondary | ICD-10-CM

## 2020-01-07 DIAGNOSIS — R188 Other ascites: Secondary | ICD-10-CM | POA: Diagnosis present

## 2020-01-07 DIAGNOSIS — D638 Anemia in other chronic diseases classified elsewhere: Secondary | ICD-10-CM | POA: Diagnosis present

## 2020-01-07 DIAGNOSIS — Z8249 Family history of ischemic heart disease and other diseases of the circulatory system: Secondary | ICD-10-CM | POA: Diagnosis not present

## 2020-01-07 DIAGNOSIS — K8689 Other specified diseases of pancreas: Secondary | ICD-10-CM | POA: Diagnosis not present

## 2020-01-07 DIAGNOSIS — K219 Gastro-esophageal reflux disease without esophagitis: Secondary | ICD-10-CM | POA: Diagnosis present

## 2020-01-07 DIAGNOSIS — Z91048 Other nonmedicinal substance allergy status: Secondary | ICD-10-CM

## 2020-01-07 DIAGNOSIS — Z4682 Encounter for fitting and adjustment of non-vascular catheter: Secondary | ICD-10-CM | POA: Diagnosis not present

## 2020-01-07 DIAGNOSIS — I480 Paroxysmal atrial fibrillation: Secondary | ICD-10-CM | POA: Diagnosis present

## 2020-01-07 DIAGNOSIS — K861 Other chronic pancreatitis: Secondary | ICD-10-CM | POA: Diagnosis present

## 2020-01-07 DIAGNOSIS — K8591 Acute pancreatitis with uninfected necrosis, unspecified: Secondary | ICD-10-CM | POA: Diagnosis not present

## 2020-01-07 DIAGNOSIS — E669 Obesity, unspecified: Secondary | ICD-10-CM | POA: Diagnosis present

## 2020-01-07 DIAGNOSIS — R1110 Vomiting, unspecified: Secondary | ICD-10-CM | POA: Diagnosis not present

## 2020-01-07 DIAGNOSIS — M1990 Unspecified osteoarthritis, unspecified site: Secondary | ICD-10-CM | POA: Diagnosis present

## 2020-01-07 DIAGNOSIS — Z823 Family history of stroke: Secondary | ICD-10-CM

## 2020-01-07 DIAGNOSIS — M8580 Other specified disorders of bone density and structure, unspecified site: Secondary | ICD-10-CM | POA: Diagnosis present

## 2020-01-07 DIAGNOSIS — Z9049 Acquired absence of other specified parts of digestive tract: Secondary | ICD-10-CM

## 2020-01-07 DIAGNOSIS — Z79899 Other long term (current) drug therapy: Secondary | ICD-10-CM

## 2020-01-07 DIAGNOSIS — K254 Chronic or unspecified gastric ulcer with hemorrhage: Secondary | ICD-10-CM | POA: Diagnosis present

## 2020-01-07 DIAGNOSIS — R0902 Hypoxemia: Secondary | ICD-10-CM | POA: Diagnosis not present

## 2020-01-07 DIAGNOSIS — I4891 Unspecified atrial fibrillation: Secondary | ICD-10-CM | POA: Diagnosis present

## 2020-01-07 DIAGNOSIS — K922 Gastrointestinal hemorrhage, unspecified: Secondary | ICD-10-CM | POA: Diagnosis not present

## 2020-01-07 DIAGNOSIS — E785 Hyperlipidemia, unspecified: Secondary | ICD-10-CM | POA: Diagnosis present

## 2020-01-07 DIAGNOSIS — E1165 Type 2 diabetes mellitus with hyperglycemia: Secondary | ICD-10-CM | POA: Diagnosis not present

## 2020-01-07 DIAGNOSIS — K859 Acute pancreatitis without necrosis or infection, unspecified: Secondary | ICD-10-CM | POA: Diagnosis not present

## 2020-01-07 DIAGNOSIS — E43 Unspecified severe protein-calorie malnutrition: Secondary | ICD-10-CM | POA: Diagnosis not present

## 2020-01-07 DIAGNOSIS — Z833 Family history of diabetes mellitus: Secondary | ICD-10-CM

## 2020-01-07 DIAGNOSIS — J90 Pleural effusion, not elsewhere classified: Secondary | ICD-10-CM | POA: Diagnosis present

## 2020-01-07 DIAGNOSIS — Z6836 Body mass index (BMI) 36.0-36.9, adult: Secondary | ICD-10-CM

## 2020-01-07 DIAGNOSIS — Z8349 Family history of other endocrine, nutritional and metabolic diseases: Secondary | ICD-10-CM

## 2020-01-07 DIAGNOSIS — H35033 Hypertensive retinopathy, bilateral: Secondary | ICD-10-CM | POA: Diagnosis present

## 2020-01-07 DIAGNOSIS — Z882 Allergy status to sulfonamides status: Secondary | ICD-10-CM | POA: Diagnosis not present

## 2020-01-07 DIAGNOSIS — R918 Other nonspecific abnormal finding of lung field: Secondary | ICD-10-CM | POA: Diagnosis not present

## 2020-01-07 DIAGNOSIS — Z96651 Presence of right artificial knee joint: Secondary | ICD-10-CM | POA: Diagnosis present

## 2020-01-07 DIAGNOSIS — Z515 Encounter for palliative care: Secondary | ICD-10-CM | POA: Diagnosis not present

## 2020-01-07 DIAGNOSIS — D508 Other iron deficiency anemias: Secondary | ICD-10-CM | POA: Diagnosis not present

## 2020-01-07 LAB — CBC
HCT: 24 % — ABNORMAL LOW (ref 36.0–46.0)
Hemoglobin: 7.4 g/dL — ABNORMAL LOW (ref 12.0–15.0)
MCH: 27.2 pg (ref 26.0–34.0)
MCHC: 30.8 g/dL (ref 30.0–36.0)
MCV: 88.2 fL (ref 80.0–100.0)
Platelets: 152 10*3/uL (ref 150–400)
RBC: 2.72 MIL/uL — ABNORMAL LOW (ref 3.87–5.11)
RDW: 15.4 % (ref 11.5–15.5)
WBC: 7.2 10*3/uL (ref 4.0–10.5)
nRBC: 0 % (ref 0.0–0.2)

## 2020-01-07 LAB — BASIC METABOLIC PANEL
Anion gap: 11 (ref 5–15)
BUN: 25 mg/dL — ABNORMAL HIGH (ref 8–23)
CO2: 21 mmol/L — ABNORMAL LOW (ref 22–32)
Calcium: 8.3 mg/dL — ABNORMAL LOW (ref 8.9–10.3)
Chloride: 108 mmol/L (ref 98–111)
Creatinine, Ser: 1.79 mg/dL — ABNORMAL HIGH (ref 0.44–1.00)
GFR calc Af Amer: 32 mL/min — ABNORMAL LOW (ref >60)
GFR calc non Af Amer: 27 mL/min — ABNORMAL LOW (ref >60)
Glucose, Bld: 221 mg/dL — ABNORMAL HIGH (ref 70–99)
Potassium: 4 mmol/L (ref 3.5–5.1)
Sodium: 140 mmol/L (ref 135–145)

## 2020-01-07 LAB — GLUCOSE, CAPILLARY
Glucose-Capillary: 160 mg/dL — ABNORMAL HIGH (ref 70–99)
Glucose-Capillary: 260 mg/dL — ABNORMAL HIGH (ref 70–99)
Glucose-Capillary: 229 mg/dL — ABNORMAL HIGH (ref 70–99)

## 2020-01-07 MED ORDER — PANTOPRAZOLE SODIUM 40 MG PO TBEC
40.0000 mg | DELAYED_RELEASE_TABLET | Freq: Two times a day (BID) | ORAL | Status: DC
  Administered 2020-01-07 – 2020-01-10 (×7): 40 mg via ORAL
  Filled 2020-01-07 (×8): qty 1

## 2020-01-07 MED ORDER — SUCRALFATE 1 GM/10ML PO SUSP
1.0000 g | Freq: Four times a day (QID) | ORAL | Status: DC
  Administered 2020-01-07 – 2020-01-11 (×17): 1 g via ORAL
  Filled 2020-01-07 (×16): qty 10

## 2020-01-07 MED ORDER — AMLODIPINE BESYLATE 2.5 MG PO TABS
2.5000 mg | ORAL_TABLET | Freq: Every day | ORAL | Status: DC
  Administered 2020-01-07 – 2020-01-10 (×4): 2.5 mg via ORAL
  Filled 2020-01-07 (×5): qty 1

## 2020-01-07 MED ORDER — AMIODARONE HCL 200 MG PO TABS
200.0000 mg | ORAL_TABLET | Freq: Every day | ORAL | Status: DC
  Administered 2020-01-07 – 2020-01-11 (×5): 200 mg via ORAL
  Filled 2020-01-07 (×5): qty 1

## 2020-01-07 MED ORDER — MORPHINE SULFATE (PF) 4 MG/ML IV SOLN
4.0000 mg | INTRAVENOUS | Status: DC | PRN
  Administered 2020-01-07: 4 mg via INTRAVENOUS
  Filled 2020-01-07: qty 1

## 2020-01-07 MED ORDER — FLUCONAZOLE 150 MG PO TABS
150.0000 mg | ORAL_TABLET | Freq: Once | ORAL | Status: AC
  Administered 2020-01-07: 150 mg via ORAL
  Filled 2020-01-07 (×2): qty 1

## 2020-01-07 MED ORDER — SODIUM CHLORIDE 0.9 % IV SOLN: INTRAVENOUS | Status: AC

## 2020-01-07 MED ORDER — ONDANSETRON HCL 4 MG PO TABS
4.0000 mg | ORAL_TABLET | Freq: Four times a day (QID) | ORAL | Status: DC | PRN
  Administered 2020-01-11: 4 mg via ORAL
  Filled 2020-01-07: qty 1

## 2020-01-07 MED ORDER — NYSTATIN 100000 UNIT/GM EX CREA
TOPICAL_CREAM | Freq: Two times a day (BID) | CUTANEOUS | Status: DC
  Filled 2020-01-07: qty 15

## 2020-01-07 MED ORDER — LOPERAMIDE HCL 2 MG PO CAPS
2.0000 mg | ORAL_CAPSULE | Freq: Four times a day (QID) | ORAL | Status: DC | PRN
  Administered 2020-01-07 – 2020-01-10 (×2): 2 mg via ORAL
  Filled 2020-01-07 (×2): qty 1

## 2020-01-07 MED ORDER — INSULIN ASPART 100 UNIT/ML ~~LOC~~ SOLN
0.0000 [IU] | Freq: Four times a day (QID) | SUBCUTANEOUS | Status: DC
  Administered 2020-01-07 (×2): 3 [IU] via SUBCUTANEOUS
  Administered 2020-01-07 – 2020-01-08 (×4): 2 [IU] via SUBCUTANEOUS
  Administered 2020-01-08: 3 [IU] via SUBCUTANEOUS
  Administered 2020-01-09: 2 [IU] via SUBCUTANEOUS
  Administered 2020-01-09: 3 [IU] via SUBCUTANEOUS
  Administered 2020-01-09: 2 [IU] via SUBCUTANEOUS

## 2020-01-07 MED ORDER — ONDANSETRON HCL 4 MG/2ML IJ SOLN
4.0000 mg | Freq: Four times a day (QID) | INTRAMUSCULAR | Status: DC | PRN
  Administered 2020-01-08 – 2020-01-13 (×3): 4 mg via INTRAVENOUS
  Filled 2020-01-07 (×3): qty 2

## 2020-01-07 MED ORDER — SODIUM CHLORIDE 0.9 % IV SOLN
500.0000 mg | Freq: Two times a day (BID) | INTRAVENOUS | Status: DC
  Administered 2020-01-07 – 2020-01-09 (×6): 500 mg via INTRAVENOUS
  Filled 2020-01-07 (×4): qty 0.5
  Filled 2020-01-07: qty 500
  Filled 2020-01-07 (×2): qty 0.5

## 2020-01-07 MED ORDER — OXYCODONE-ACETAMINOPHEN 7.5-325 MG PO TABS
1.0000 | ORAL_TABLET | ORAL | Status: DC | PRN
  Administered 2020-01-07 – 2020-01-09 (×5): 1 via ORAL
  Filled 2020-01-07 (×5): qty 1

## 2020-01-07 MED ORDER — MORPHINE SULFATE (PF) 4 MG/ML IV SOLN
4.0000 mg | INTRAVENOUS | Status: DC | PRN
  Administered 2020-01-07 – 2020-01-10 (×8): 4 mg via INTRAVENOUS
  Filled 2020-01-07 (×8): qty 1

## 2020-01-07 NOTE — Progress Notes (Signed)
Pt is diabetic and tells me she was taking after her last admission and was also taking it during her last admission. Paged Dr. Raelyn Mora to ask for CBG's and correction scale insulin.

## 2020-01-07 NOTE — H&P (Signed)
History and Physical    Katherine Oliver CXK:[id number] DOB: 08-21-1946 DOA: 01/07/2020  PCP: [name], MD  Patient coming from: Home  Chief Complaint: Abdominal pain  HPI: Katherine Oliver is a 74 y.o. female with medical history significant of recent hospitalization for gallstone pancreatitis and choledocholithiasis had stent placed via ERCP with GI service also had issues with A. fib and acute kidney injury was on dialysis briefly.  She was discharged 2 days ago.  She was also diagnosed with necrotizing pancreatitis at that time and was told that they cannot remove her gallbladder until her pancreas issues got better.  She was on a lot of IV narcotics while she was here along with oral Vicodin.  She reports she left 2 days ago her pain was not well controlled she went home with Vicodin which did not help her in the hospital 2 days ago and her pain got worse.  She denies any fevers.  She denies any nausea vomiting or diarrhea.  Patient transferred here from [facility] because of persistent necrotizing pancreatitis but her main issue is poorly controlled pain at discharge. The morphine she is receiving is helping her a lot.   Review of Systems: As per HPI otherwise 10 point review of systems negative.   Past Medical History:  Diagnosis Date  . Allergic rhinitis   . Arthritis   . 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 nephropathy ([place])    DSME at [facility] 01/2016   . Frequent epistaxis 05/16/2019   S/p cauterization with resolution 2020  . GERD (gastroesophageal reflux disease)   . Hepatic steatosis    by abd [place] 05/2012, mild transaminitis - normal iron sat and viral hep panel (2011), stable [place] 2017  . History of chicken pox   . History of measles   . History of recurrent UTIs    on chronic keflex  . HLD (hyperlipidemia)   . HTN  (hypertension)   . 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 Kidney cyst, acquired 01/2016   L kidney by [place]  . Kidney stone 01/2016   L kidney by [place]  . Lung nodules 11/2013   overall stable on f/u CT 01/2016  . Osteopenia 06/2013   mild, forearm T -1.1, hip and spine WNL  . Pancreatitis   . Polycythemia    mild, stable (2013)  . Primary localized osteoarthritis of right knee 01/05/2019  . Rosacea    metrogel    Past Surgical History:  Procedure Laterality Date  . APPENDECTOMY  1987  . BILIARY STENT PLACEMENT  12/14/2019   Procedure: BILIARY STENT PLACEMENT;  Surgeon: [name], MD;  Location: [place];  Service: Endoscopy;;  . BREAST BIOPSY Right 1963   benign  . BREAST BIOPSY Right 08/2014   benign- core  . cardiolite stress test  04/2004   normal  . CESAREAN SECTION  [id number]   x2  . CHOLECYSTECTOMY  2003  . COLONOSCOPY  09/26/2008   adenomatous polyp, rpt 3-5 yrs  . COLONOSCOPY  08/2012   adenomatous polyps, diverticulosis, rec rpt 5 yrs [name])  . COLONOSCOPY WITH PROPOFOL N/A 02/05/2018   4TA, SSA, diverticulosis, rpt 3 yrs [name], [name], MD)  . dexa  2003   normal  . dexa  06/2013   [facility] - Tscore -1.1 forearm, normal spine  and femur  . ERCP N/A 12/14/2019   Procedure: ENDOSCOPIC RETROGRADE CHOLANGIOPANCREATOGRAPHY (ERCP);  Surgeon: [name], MD;  Location: [place];  Service: Endoscopy;  Laterality: N/A;  . ESOPHAGOGASTRODUODENOSCOPY N/A 12/19/2019   Procedure: ESOPHAGOGASTRODUODENOSCOPY (EGD);  Surgeon: [name], MD;  Location: [facility] ENDOSCOPY;  Service: Endoscopy;  Laterality: N/A;  . IR FLUORO GUIDE CV LINE RIGHT  12/27/2019  . IR REMOVAL TUN CV CATH W/O FL  01/03/2020  . SPHINCTEROTOMY  12/14/2019   Procedure: SPHINCTEROTOMY;  Surgeon: [name], MD;  Location: [place];  Service: Endoscopy;;  . TOTAL KNEE ARTHROPLASTY Right 01/17/2019   Procedure: TOTAL KNEE ARTHROPLASTY;  Surgeon: [name], MD;   Location: [facility];  Service: Orthopedics;  Laterality: Right;  . TRANSTHORACIC ECHOCARDIOGRAM  04/2019   EF 55-60%, modLVH, impaired relaxation   . TRIGGER FINGER RELEASE  2007;2010;2011   bilateral  . TRIGGER FINGER RELEASE  02/2017  . VAGINAL HYSTERECTOMY  1984   for menorrhagia, ovaries in place     reports that she has never smoked. She has never used smokeless tobacco. She reports previous alcohol use of about 1.0 - 2.0 standard drinks of alcohol per week. She reports that she does not use drugs.  Allergies  Allergen Reactions  . Azithromycin Itching    Okay if takes benadryl along with it  . Nickel     Reaction to cheap earrings  . Sulfa Antibiotics   . Adhesive [Tape] Rash    Paper tape - blisters    Family History  Problem Relation Age of Onset  . Stroke Mother        several  . Hyperlipidemia Mother   . Hypertension Mother   . Cancer Father        colon  . Hypertension Father   . Hyperlipidemia Father   . Coronary artery disease Father 48       MIx1, CABG  . Cancer Paternal Aunt        abdominal  . Coronary artery disease Maternal Grandmother   . Diabetes Maternal Grandfather   . Coronary artery disease Maternal Grandfather   . Breast cancer Neg Hx     Prior to Admission medications   Medication Sig Start Date End Date Taking? Authorizing Provider  amiodarone (PACERONE) 200 MG tablet Take 1 tablet (200 mg total) by mouth daily. 01/04/20   Raiford Noble Latif, DO  amLODipine (NORVASC) 2.5 MG tablet Take 1 tablet (2.5 mg total) by mouth daily. 01/04/20   Sheikh, Omair Latif, DO  fluticasone (FLONASE) 50 MCG/ACT nasal spray Place 1 spray into both nostrils daily. 01/04/20   Raiford Noble Latif, DO  HYDROcodone-acetaminophen (NORCO/VICODIN) 5-325 MG tablet Take 1-2 tablets by mouth every 4 (four) hours as needed for moderate pain. 01/03/20   Raiford Noble Latif, DO  loperamide (IMODIUM) 2 MG capsule Take 1 capsule (2 mg total) by mouth as needed for diarrhea or loose  stools. 01/03/20   Raiford Noble Latif, DO  multivitamin (RENA-VIT) TABS tablet Take 1 tablet by mouth at bedtime. 01/03/20   Raiford Noble Latif, DO  Nutritional Supplements (FEEDING SUPPLEMENT, NEPRO CARB STEADY,) LIQD Take 237 mLs by mouth 3 (three) times daily between meals. 01/03/20   Sheikh, Omair Latif, DO  ondansetron (ZOFRAN) 4 MG tablet Take 1 tablet (4 mg total) by mouth every 6 (six) hours as needed for nausea. 01/03/20   Raiford Noble Latif, DO  pantoprazole (PROTONIX) 40 MG tablet Take 1 tablet (40 mg total) by mouth 2 (two) times daily  before a meal. 01/03/20   Sheikh, Georgina Quint Latif, DO  sucralfate (CARAFATE) 1 GM/10ML suspension Take 10 mLs (1 g total) by mouth every 6 (six) hours. 01/03/20   Kerney Elbe, DO    Physical Exam: Vitals:   01/06/20 2336  Weight: 80.2 kg  Height: 5\' 1"  (1.549 m)      Constitutional: NAD, calm, comfortable Vitals:   01/06/20 2336  Weight: 80.2 kg  Height: 5\' 1"  (1.549 m)   Eyes: PERRL, lids and conjunctivae normal ENMT: Mucous membranes are moist. Posterior pharynx clear of any exudate or lesions.Normal dentition.  Neck: normal, supple, no masses, no thyromegaly Respiratory: clear to auscultation bilaterally, no wheezing, no crackles. Normal respiratory effort. No accessory muscle use.  Cardiovascular: Regular rate and rhythm, no murmurs / rubs / gallops. No extremity edema. 2+ pedal pulses. No carotid bruits.  Abdomen: Diffuse tenderness, no masses palpated. No hepatosplenomegaly. Bowel sounds positive.  Musculoskeletal: no clubbing / cyanosis. No joint deformity upper and lower extremities. Good ROM, no contractures. Normal muscle tone.  Skin: no rashes, lesions, ulcers. No induration Neurologic: CN 2-12 grossly intact. Sensation intact, DTR normal. Strength 5/5 in all 4.  Psychiatric: Normal judgment and insight. Alert and oriented x 3. Normal mood.    Labs on Admission: I have personally reviewed following labs and imaging  studies  CBC: Recent Labs  Lab 12/31/19 0716 12/31/19 0716 01/01/20 0334 01/01/20 0334 01/01/20 1849 01/02/20 0340 01/03/20 1215 01/04/20 1734 01/06/20 1339  WBC 8.0   < > 7.1  --   --  8.1 8.2 15.2* 16.8*  NEUTROABS 6.4  --  5.6  --   --  6.6 6.9  --   --   HGB 8.3*   < > 7.7*   < > 9.5* 9.4* 9.5* 9.6* 8.9*  HCT 26.1*   < > 23.8*   < > 28.7* 28.7* 29.4* 30.4* 28.7*  MCV 89.1   < > 86.5  --   --  85.9 85.5 89.4 88.0  PLT 228   < > 213  --   --  203 182 191 180   < > = values in this interval not displayed.   Basic Metabolic Panel: Recent Labs  Lab 12/31/19 0716 12/31/19 0716 01/01/20 0334 01/02/20 0340 01/03/20 1215 01/04/20 1734 01/06/20 1339  NA 132*   < > 132* 135 137 134* 133*  K 3.4*   < > 3.2* 4.0 3.3* 4.0 3.7  CL 96*   < > 97* 101 101 100 98  CO2 23   < > 23 22 23  21* 21*  GLUCOSE 143*   < > 137* 145* 162* 315* 252*  BUN 37*   < > 41* 40* 40* 38* 31*  CREATININE 4.68*   < > 4.41* 3.84* 2.94* 2.65* 1.91*  CALCIUM 8.5*   < > 8.5* 8.6* 8.8* 8.8* 8.9  MG 1.7  --  1.6* 1.9 1.5*  --   --   PHOS 5.2*  --  4.6 4.3 3.8  --   --    < > = values in this interval not displayed.   GFR: Estimated Creatinine Clearance: 24.4 mL/min (A) (by C-G formula based on SCr of 1.91 mg/dL (H)). Liver Function Tests: Recent Labs  Lab 01/01/20 0334 01/02/20 0340 01/03/20 1215 01/04/20 1734 01/06/20 1339  AST  --  29 18 23 18   ALT  --  5 10 13 12   ALKPHOS  --  93 108 112 135*  BILITOT  --  1.2 0.8 0.7 1.1  PROT  --  5.2* 5.9* 5.8* 6.9  ALBUMIN 2.2* 2.4* 2.6* 2.8* 3.3*   Recent Labs  Lab 01/04/20 1734 01/06/20 1339  LIPASE 35 27   No results for input(s): AMMONIA in the last 168 hours. Coagulation Profile: No results for input(s): INR, PROTIME in the last 168 hours. Cardiac Enzymes: No results for input(s): CKTOTAL, CKMB, CKMBINDEX, TROPONINI in the last 168 hours. BNP (last 3 results) No results for input(s): PROBNP in the last 8760 hours. HbA1C: No results for  input(s): HGBA1C in the last 72 hours. CBG: Recent Labs  Lab 01/02/20 2030 01/03/20 0733 01/03/20 1146 01/03/20 1700 01/06/20 2234  GLUCAP 139* 154* 147* 156* 185*   Lipid Profile: No results for input(s): CHOL, HDL, LDLCALC, TRIG, CHOLHDL, LDLDIRECT in the last 72 hours. Thyroid Function Tests: No results for input(s): TSH, T4TOTAL, FREET4, T3FREE, THYROIDAB in the last 72 hours. Anemia Panel: No results for input(s): VITAMINB12, FOLATE, FERRITIN, TIBC, IRON, RETICCTPCT in the last 72 hours. Urine analysis:    Component Value Date/Time   COLORURINE YELLOW (A) 01/06/2020 1747   APPEARANCEUR CLEAR (A) 01/06/2020 1747   APPEARANCEUR Cloudy (A) 12/12/2019 1332   LABSPEC 1.014 01/06/2020 1747   PHURINE 5.0 01/06/2020 1747   GLUCOSEU 150 (A) 01/06/2020 1747   HGBUR NEGATIVE 01/06/2020 1747   BILIRUBINUR NEGATIVE 01/06/2020 1747   BILIRUBINUR Negative 12/12/2019 1332   KETONESUR NEGATIVE 01/06/2020 1747   PROTEINUR NEGATIVE 01/06/2020 1747   UROBILINOGEN 0.2 02/12/2018 0805   NITRITE NEGATIVE 01/06/2020 1747   LEUKOCYTESUR NEGATIVE 01/06/2020 1747   Sepsis Labs: !!!!!!!!!!!!!!!!!!!!!!!!!!!!!!!!!!!!!!!!!!!! @LABRCNTIP (procalcitonin:4,lacticidven:4) ) Recent Results (from the past 240 hour(s))  Respiratory Panel by RT PCR (Flu A&B, Covid) - Nasopharyngeal Swab     Status: None   Collection Time: 01/06/20  5:16 PM   Specimen: Nasopharyngeal Swab  Result Value Ref Range Status   SARS Coronavirus 2 by RT PCR NEGATIVE NEGATIVE Final    Comment: (NOTE) SARS-CoV-2 target nucleic acids are NOT DETECTED. The SARS-CoV-2 RNA is generally detectable in upper respiratoy specimens during the acute phase of infection. The lowest concentration of SARS-CoV-2 viral copies this assay can detect is 131 copies/mL. A negative result does not preclude SARS-Cov-2 infection and should not be used as the sole basis for treatment or other patient management decisions. A negative result may occur  with  improper specimen collection/handling, submission of specimen other than nasopharyngeal swab, presence of viral mutation(s) within the areas targeted by this assay, and inadequate number of viral copies (<131 copies/mL). A negative result must be combined with clinical observations, patient history, and epidemiological information. The expected result is Negative. Fact Sheet for Patients:  PinkCheek.be Fact Sheet for Healthcare Providers:  GravelBags.it This test is not yet ap proved or cleared by the [place] FDA and  has been authorized for detection and/or diagnosis of SARS-CoV-2 by FDA under an Emergency Use Authorization (EUA). This EUA will remain  in effect (meaning this test can be used) for the duration of the COVID-19 declaration under Section 564(b)(1) of the Act, 21 U.S.C. section 360bbb-3(b)(1), unless the authorization is terminated or revoked sooner.    Influenza A by PCR NEGATIVE NEGATIVE Final   Influenza B by PCR NEGATIVE NEGATIVE Final    Comment: (NOTE) The Xpert Xpress SARS-CoV-2/FLU/RSV assay is intended as an aid in  the diagnosis of influenza from Nasopharyngeal swab specimens and  should not be used as a sole basis for treatment. Nasal washings and  aspirates  are unacceptable for Xpert Xpress SARS-CoV-2/FLU/RSV  testing. Fact Sheet for Patients: PinkCheek.be Fact Sheet for Healthcare Providers: GravelBags.it This test is not yet approved or cleared by the [place] FDA and  has been authorized for detection and/or diagnosis of SARS-CoV-2 by  FDA under an Emergency Use Authorization (EUA). This EUA will remain  in effect (meaning this test can be used) for the duration of the  Covid-19 declaration under Section 564(b)(1) of the Act, 21  U.S.C. section 360bbb-3(b)(1), unless the authorization is  terminated or revoked. Performed  at [facility], [address] [place], [place] [address]      Radiological Exams on Admission: CT ABDOMEN PELVIS WO CONTRAST  Result Date: 01/06/2020 CLINICAL DATA:  Lower abdominal pain increasing over the past several days EXAM: CT ABDOMEN AND PELVIS WITHOUT CONTRAST TECHNIQUE: Multidetector CT imaging of the abdomen and pelvis was performed following the standard protocol without IV contrast. COMPARISON:  CT abdomen pelvis 12/13/2019 FINDINGS: Lower chest: Bilateral pleural effusions, right greater than left with adjacent passive atelectasis in the lung bases. Cardiac size is within normal limits. Extensive coronary artery calcifications noted. Calcifications also present on the aortic leaflets. Trace pericardial effusion is within physiologic normal. Hepatobiliary: There is pneumobilia within the anti dependent portions of the left lobe liver. Postsurgical changes from prior cholecystectomy. A common bile duct stent is in place, displaced to the right by the large pancreatic fluid collections detailed below. No gross intra or hepatic or extrahepatic ductal dilatation is seen. Distal tip of this biliary stent appears positioned within the duodenal lumen though the bowel is largely collapsed secondary to mass effect as well. Pancreas: An interval development of a large heterogeneous collection centered upon the pancreas, replacing much of the pancreatic parenchyma. Given configuration, difficult to accurately measure though there is a maximal AP thickness of up to 7 cm with medial-lateral extent of 16 cm and a craniocaudal extent of 8 cm (coronal 5/40). Several sites of higher attenuation material within this collection are worrisome for acute hemorrhage given the attenuation on this noncontrast study (5/36). Extensive severe intra peritoneal and retroperitoneal phlegmonous changes are present as well as a separate intraperitoneal rim enhancing collection extending inferolaterally within the  central mesentery measuring 5.6 x 2.5 x 3.1 cm (5/36, 6/47). Spleen: Normal in size without focal abnormality. Adrenals/Urinary Tract: Normal adrenal glands. Stable exophytic cystic lesion arising from the lower pole left kidney demonstrating increased attenuation on prior contrast enhanced study. No concerning renal masses, urolithiasis or hydronephrosis. Mild bilateral symmetric perinephric stranding, a nonspecific which could be reactive or correlate with diminished renal function, advanced patient age, or infection. Gas is noted within the urinary bladder, correlate for recent instrumentation. Stomach/Bowel: Distal esophagus is unremarkable. Some gastric wall thickening is likely reactive due to inflammation in the lesser sac. Lateral displacement and compression of the duodenum by the mass effect from the pancreatic collection detailed above. No other small bowel dilatation or wall thickening. The appendix is poorly visualized but without focal pericecal inflammation to suggest acute appendicitis. No colonic dilatation or wall thickening. Scattered colonic diverticula without focal pericolonic inflammation to suggest diverticulitis. Vascular/Lymphatic: Atherosclerotic plaque within the normal caliber aorta. Evaluation of the vasculature is limited in the absence of contrast media. Reactive adenopathy throughout the abdomen and pelvis. No pathologically enlarged nodes. Reproductive: Uterus is surgically absent. No concerning adnexal lesions. Other: Peripancreatic in mid abdominal fluid collections as detailed above. Small volume simple attenuation ascites noted in the perihepatic space and perisplenic spaces  as well as layering in the deep pelvis. No free intraperitoneal air is seen at this time. Musculoskeletal: Multilevel degenerative changes are present in the imaged portions of the spine. No acute osseous abnormality or suspicious osseous lesion. IMPRESSION: 1. Interval development of a large heterogeneous  collection centered upon the pancreas, replacing much of the pancreatic parenchyma. Given time course from initial onset pancreatitis symptoms (12/13/2019) findings are compatible with an acute necrotic collection as per the revised [facility] classification of acute pancreatitis. 2. Several sites of higher attenuation material within this collection are worrisome for acute hemorrhage given the attenuation on this noncontrast study consider correlation with CBC. 3. Extensive intra peritoneal and retroperitoneal phlegmonous changes are present as well as a separate intraperitoneal rim enhancing collection within the central mesentery measuring 5.6 x 2.5 x 3.1 cm. 4. Small volume simple attenuation ascites. No free intraperitoneal air is seen at this time. 5. Bilateral pleural effusions, right greater than left with adjacent passive atelectasis in the lung bases. 6. Gas within the urinary bladder, correlate for recent instrumentation. 7. Stable exophytic lesion arising from the lower pole left kidney. Previously demonstrating central enhancement on comparison contrast-enhanced study. Consider outpatient MRI when patient is better able to tolerate the study. 8. Aortic Atherosclerosis (ICD10-I70.0). These results were called by telephone at the time of interpretation on 01/06/2020 at 4:41 pm to provider [facility] , who verbally acknowledged these results. Electronically Signed   By: Lovena Le M.D.   On: 01/06/2020 16:41   DG Chest 2 View  Result Date: 01/06/2020 CLINICAL DATA:  Cough and dyspnea. EXAM: CHEST - 2 VIEW COMPARISON:  December 17, 2019 FINDINGS: The nasogastric tube, feeding tube and right internal jugular venous catheter seen on the prior study have been removed. Mild atelectasis and/or infiltrate is seen within the left lung base. There is mild blunting of the bilateral costophrenic angles. No pneumothorax is identified. The heart size and mediastinal contours are within normal limits. Multilevel  degenerative changes seen throughout the thoracic spine. Radiopaque surgical clips are seen overlying the right upper quadrant. IMPRESSION: 1. Mild atelectasis and/or infiltrate within the left lung base. 2. Small bilateral pleural effusions. Electronically Signed   By: Virgina Norfolk M.D.   On: 01/06/2020 21:17   Old chart reviewed   Assessment/Plan 74 year old female with a complicated recent history with necrotizing pancreatitis, gallstone pancreatitis with recent dialysis needs comes in with uncontrolled abdominal pain at home  Principal Problem:    Acute necrotizing pancreatitis-patient has known necrotizing pancreatitis which appears through imaging today that it is progressed.  Will cover with [place].  Consult IR to see if there is anything that can be drained.  Adjust pain medications as this is poorly controlled at home.  Keep n.p.o. for now.  May need surgical evaluation.  Patient currently not septic.  Active Problems:   HTN (hypertension)-continue home meds    AKI (acute kidney injury) (HCC)-at recent hospitalization that required dialysis.  Patient urinating fine and her renal function continues to improve.  Creatinine at discharge was 2.6 is currently 1.9.    AF (paroxysmal atrial fibrillation) (HCC)-continue amiodarone      DVT prophylaxis: SCDs Code Status: Full Family Communication: None Disposition Plan: Weeks Consults called: IR Admission status: Admission   Katherine Oliver A MD [facility]  If 7PM-7AM, please contact night-coverage [email or web address] Password [email or web address]  01/07/2020, 1:21 AM

## 2020-01-07 NOTE — Consult Note (Signed)
Chief Complaint: Patient was seen in consultation today for necrotizing pancreatitis  Referring Physician(s): Dr. Shanon Brow  Supervising Physician: Sandi Mariscal  Patient Status: [facility] - In-pt  History of Present Illness: Katherine Oliver is a 74 y.o. female with past medical history of recent prolonged hospitalization due to gallstone pancreatitis, choledocholithiasis s/p stent placement with GI 12/14/19.  Course complicated by acute findings of a fib as well as acute renal injury requiring brief dialysis.  She was found to have necrotizing pancreatitis during her hospitalization, and was unable to undergo cholecystectomy.  She was discharged home on 01/03/20, however returned to [facility] 3/5 due to intractable abdominal pain.   CT Abdomen Pelvis 3/5 showed: 1. Interval development of a large heterogeneous collection centered upon the pancreas, replacing much of the pancreatic parenchyma. Given time course from initial onset pancreatitis symptoms (12/13/2019) findings are compatible with an acute necrotic collection as per the revised [facility] classification of acute pancreatitis. 2. Several sites of higher attenuation material within this collection are worrisome for acute hemorrhage given the attenuation on this noncontrast study consider correlation with CBC. 3. Extensive intra peritoneal and retroperitoneal phlegmonous changes are present as well as a separate intraperitoneal rim enhancing collection within the central mesentery measuring 5.6 x 2.5 x 3.1 cm. 4. Small volume simple attenuation ascites. No free intraperitoneal air is seen at this time. 5. Bilateral pleural effusions, right greater than left with adjacent passive atelectasis in the lung bases. 6. Gas within the urinary bladder, correlate for recent instrumentation. 7. Stable exophytic lesion arising from the lower pole left kidney. Previously demonstrating central enhancement on  comparison contrast-enhanced study. Consider outpatient MRI when patient is better able to tolerate the study.  IR consulted for aspiration vs. Drainage of her large pancreatic collection.   Patient assessed at bedside.  She is found resting comfortably. Complains of abdominal pain which is only controlled by morphine. States her abdomen is distended and that none of her regular clothes fit. She feels the left is more swollen compared to the right.  She is open to intervention if needed.   Past Medical History:  Diagnosis Date  . Allergic rhinitis   . Arthritis   . 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 nephropathy ([place])    DSME at [facility] 01/2016   . Frequent epistaxis 05/16/2019   S/p cauterization with resolution 2020  . GERD (gastroesophageal reflux disease)   . Hepatic steatosis    by abd [place] 05/2012, mild transaminitis - normal iron sat and viral hep panel (2011), stable [place] 2017  . History of chicken pox   . History of measles   . History of recurrent UTIs    on chronic keflex  . HLD (hyperlipidemia)   . HTN (hypertension)   . 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 Kidney cyst, acquired 01/2016   L kidney by [place]  . Kidney stone 01/2016   L kidney by [place]  . Lung nodules 11/2013   overall stable on f/u CT 01/2016  . Osteopenia 06/2013   mild, forearm T -1.1, hip and spine WNL  . Pancreatitis   . Polycythemia    mild, stable (2013)  . Primary localized osteoarthritis of right knee 01/05/2019  . Rosacea    metrogel    Past Surgical History:  Procedure Laterality Date  .  APPENDECTOMY  1987  . BILIARY STENT PLACEMENT  12/14/2019   Procedure: BILIARY STENT PLACEMENT;  Surgeon: [name], MD;  Location: [place];  Service: Endoscopy;;  . BREAST BIOPSY Right 1963   benign  . BREAST BIOPSY Right 08/2014   benign- core   . cardiolite stress test  04/2004   normal  . CESAREAN SECTION  [id number]   x2  . CHOLECYSTECTOMY  2003  . COLONOSCOPY  09/26/2008   adenomatous polyp, rpt 3-5 yrs  . COLONOSCOPY  08/2012   adenomatous polyps, diverticulosis, rec rpt 5 yrs [name])  . COLONOSCOPY WITH PROPOFOL N/A 02/05/2018   4TA, SSA, diverticulosis, rpt 3 yrs [name], [name], MD)  . dexa  2003   normal  . dexa  06/2013   [facility] - Tscore -1.1 forearm, normal spine and femur  . ERCP N/A 12/14/2019   Procedure: ENDOSCOPIC RETROGRADE CHOLANGIOPANCREATOGRAPHY (ERCP);  Surgeon: [name], MD;  Location: [place];  Service: Endoscopy;  Laterality: N/A;  . ESOPHAGOGASTRODUODENOSCOPY N/A 12/19/2019   Procedure: ESOPHAGOGASTRODUODENOSCOPY (EGD);  Surgeon: [name], MD;  Location: [facility] ENDOSCOPY;  Service: Endoscopy;  Laterality: N/A;  . IR FLUORO GUIDE CV LINE RIGHT  12/27/2019  . IR REMOVAL TUN CV CATH W/O FL  01/03/2020  . SPHINCTEROTOMY  12/14/2019   Procedure: SPHINCTEROTOMY;  Surgeon: [name], MD;  Location: [place];  Service: Endoscopy;;  . 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 TRANSTHORACIC ECHOCARDIOGRAM  04/2019   EF 55-60%, modLVH, impaired relaxation   . TRIGGER FINGER RELEASE  2007;2010;2011   bilateral  . TRIGGER FINGER RELEASE  02/2017  . VAGINAL HYSTERECTOMY  1984   for menorrhagia, ovaries in place    Allergies: Azithromycin, Nickel, Sulfa antibiotics, and Adhesive [tape]  Medications: Prior to Admission medications   Medication Sig Start Date End Date Taking? Authorizing Provider  amiodarone (PACERONE) 200 MG tablet Take 1 tablet (200 mg total) by mouth daily. 01/04/20   Raiford Noble Latif, DO  amLODipine (NORVASC) 2.5 MG tablet Take 1 tablet (2.5 mg total) by mouth daily. 01/04/20   Sheikh, Omair Latif, DO  fluticasone (FLONASE) 50 MCG/ACT nasal spray  Place 1 spray into both nostrils daily. 01/04/20   Raiford Noble Latif, DO  HYDROcodone-acetaminophen (NORCO/VICODIN) 5-325 MG tablet Take 1-2 tablets by mouth every 4 (four) hours as needed for moderate pain. 01/03/20   Raiford Noble Latif, DO  loperamide (IMODIUM) 2 MG capsule Take 1 capsule (2 mg total) by mouth as needed for diarrhea or loose stools. 01/03/20   Raiford Noble Latif, DO  multivitamin (RENA-VIT) TABS tablet Take 1 tablet by mouth at bedtime. 01/03/20   Raiford Noble Latif, DO  Nutritional Supplements (FEEDING SUPPLEMENT, NEPRO CARB STEADY,) LIQD Take 237 mLs by mouth 3 (three) times daily between meals. 01/03/20   Sheikh, Omair Latif, DO  ondansetron (ZOFRAN) 4 MG tablet Take 1 tablet (4 mg total) by mouth every 6 (six) hours as needed for nausea. 01/03/20   Raiford Noble Latif, DO  pantoprazole (PROTONIX) 40 MG tablet Take 1 tablet (40 mg total) by mouth 2 (two) times daily before a meal. 01/03/20   Sheikh, Omair Latif, DO  sucralfate (CARAFATE) 1 GM/10ML suspension Take 10 mLs (1 g total) by mouth every 6 (six) hours. 01/03/20   Kerney Elbe, DO     Family History  Problem Relation Age of Onset  . Stroke  Mother        several  . Hyperlipidemia Mother   . Hypertension Mother   . Cancer Father        colon  . Hypertension Father   . Hyperlipidemia Father   . Coronary artery disease Father 24       MIx1, CABG  . Cancer Paternal Aunt        abdominal  . Coronary artery disease Maternal Grandmother   . Diabetes Maternal Grandfather   . Coronary artery disease Maternal Grandfather   . Breast cancer Neg Hx     Social History   Socioeconomic History  . Marital status: Married    Spouse name: Not on file  . Number of children: Not on file  . Years of education: Not on file  . Highest education level: Not on file  Occupational History  . Not on file  Tobacco Use  . Smoking status: Never Smoker  . Smokeless tobacco: Never Used  Substance and Sexual Activity  . Alcohol use:  Not Currently    Alcohol/week: 1.0 - 2.0 standard drinks    Types: 1 - 2 Glasses of wine per week    Comment: Occasional/1 mixed drink per month  . Drug use: No  . Sexual activity: Yes  Other Topics Concern  . 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 Daily walking about 20 min.    Diet: ok water intake 4 glasses/day, daily fruits/vegetables, red meat 4x/wk, fish 3-4x/wk   Social Determinants of Health   Financial Resource Strain: Low Risk   . Difficulty of Paying Living Expenses: Not hard at all  Food Insecurity: No Food Insecurity  . Worried About Charity fundraiser in the Last Year: Never true  . Ran Out of Food in the Last Year: Never true  Transportation Needs: No Transportation Needs  . Lack of Transportation (Medical): No  . Lack of Transportation (Non-Medical): No  Physical Activity: Inactive  . Days of Exercise per Week: 0 days  . Minutes of Exercise per Session: 0 min  Stress: No Stress Concern Present  . Feeling of Stress : Not at all  Social Connections:   . Frequency of Communication with Friends and Family: Not on file  . Frequency of Social Gatherings with Friends and Family: Not on file  . Attends Religious Services: Not on file  . Active Member of Clubs or Organizations: Not on file  . Attends Archivist Meetings: Not on file  . Marital Status: Not on file     Review of Systems: A 12 point ROS discussed and pertinent positives are indicated in the HPI above.  All other systems are negative.  Review of Systems  Constitutional: Negative for fatigue and fever.  Respiratory: Negative for cough and shortness of breath.   Cardiovascular: Negative for chest pain.  Gastrointestinal: Positive for abdominal distention and abdominal pain.  Negative for diarrhea, nausea and vomiting.  Genitourinary: Negative for dysuria.  Musculoskeletal: Negative for back pain.  Psychiatric/Behavioral: Negative for behavioral problems and confusion.    Vital Signs: BP 137/70   Pulse (!) 106   Temp 98 F (36.7 C) (Oral)   Resp 16   Ht 5\' 1"  (1.549 m)   Wt 176 lb 12.9 oz (80.2  kg)   SpO2 94%   BMI 33.41 kg/m   Physical Exam Vitals and nursing note reviewed.  Constitutional:      General: She is not in acute distress.    Appearance: She is not ill-appearing.  HENT:     Mouth/Throat:     Mouth: Mucous membranes are moist.     Pharynx: Oropharynx is clear.  Cardiovascular:     Rate and Rhythm: Normal rate and regular rhythm.  Pulmonary:     Effort: Pulmonary effort is normal. No respiratory distress.     Breath sounds: Normal breath sounds.  Abdominal:     General: Bowel sounds are normal. There is distension.     Tenderness: There is abdominal tenderness.  Skin:    General: Skin is warm and dry.  Neurological:     General: No focal deficit present.     Mental Status: She is alert and oriented to person, place, and time. Mental status is at baseline.  Psychiatric:        Mood and Affect: Mood normal.        Behavior: Behavior normal.        Thought Content: Thought content normal.        Judgment: Judgment normal.      Imaging: CT ABDOMEN PELVIS WO CONTRAST  Result Date: 01/06/2020 CLINICAL DATA:  Lower abdominal pain increasing over the past several days EXAM: CT ABDOMEN AND PELVIS WITHOUT CONTRAST TECHNIQUE: Multidetector CT imaging of the abdomen and pelvis was performed following the standard protocol without IV contrast. COMPARISON:  CT abdomen pelvis 12/13/2019 FINDINGS: Lower chest: Bilateral pleural effusions, right greater than left with adjacent passive atelectasis in the lung bases. Cardiac size is within normal limits. Extensive coronary artery calcifications noted. Calcifications also present on the aortic  leaflets. Trace pericardial effusion is within physiologic normal. Hepatobiliary: There is pneumobilia within the anti dependent portions of the left lobe liver. Postsurgical changes from prior cholecystectomy. A common bile duct stent is in place, displaced to the right by the large pancreatic fluid collections detailed below. No gross intra or hepatic or extrahepatic ductal dilatation is seen. Distal tip of this biliary stent appears positioned within the duodenal lumen though the bowel is largely collapsed secondary to mass effect as well. Pancreas: An interval development of a large heterogeneous collection centered upon the pancreas, replacing much of the pancreatic parenchyma. Given configuration, difficult to accurately measure though there is a maximal AP thickness of up to 7 cm with medial-lateral extent of 16 cm and a craniocaudal extent of 8 cm (coronal 5/40). Several sites of higher attenuation material within this collection are worrisome for acute hemorrhage given the attenuation on this noncontrast study (5/36). Extensive severe intra peritoneal and retroperitoneal phlegmonous changes are present as well as a separate intraperitoneal rim enhancing collection extending inferolaterally within the central mesentery measuring 5.6 x 2.5 x 3.1 cm (5/36, 6/47). Spleen: Normal in size without focal abnormality. Adrenals/Urinary Tract: Normal adrenal glands. Stable exophytic cystic lesion arising from the lower pole left kidney demonstrating increased attenuation on prior contrast enhanced study. No concerning renal masses, urolithiasis or hydronephrosis. Mild bilateral symmetric perinephric stranding, a nonspecific which could be reactive or correlate with diminished renal function, advanced patient age, or infection. Gas is noted within the urinary bladder, correlate for recent instrumentation. Stomach/Bowel: Distal esophagus is unremarkable. Some gastric wall thickening is likely reactive due to  inflammation in the lesser sac. Lateral displacement and compression of the duodenum by the mass effect  from the pancreatic collection detailed above. No other small bowel dilatation or wall thickening. The appendix is poorly visualized but without focal pericecal inflammation to suggest acute appendicitis. No colonic dilatation or wall thickening. Scattered colonic diverticula without focal pericolonic inflammation to suggest diverticulitis. Vascular/Lymphatic: Atherosclerotic plaque within the normal caliber aorta. Evaluation of the vasculature is limited in the absence of contrast media. Reactive adenopathy throughout the abdomen and pelvis. No pathologically enlarged nodes. Reproductive: Uterus is surgically absent. No concerning adnexal lesions. Other: Peripancreatic in mid abdominal fluid collections as detailed above. Small volume simple attenuation ascites noted in the perihepatic space and perisplenic spaces as well as layering in the deep pelvis. No free intraperitoneal air is seen at this time. Musculoskeletal: Multilevel degenerative changes are present in the imaged portions of the spine. No acute osseous abnormality or suspicious osseous lesion. IMPRESSION: 1. Interval development of a large heterogeneous collection centered upon the pancreas, replacing much of the pancreatic parenchyma. Given time course from initial onset pancreatitis symptoms (12/13/2019) findings are compatible with an acute necrotic collection as per the revised [facility] classification of acute pancreatitis. 2. Several sites of higher attenuation material within this collection are worrisome for acute hemorrhage given the attenuation on this noncontrast study consider correlation with CBC. 3. Extensive intra peritoneal and retroperitoneal phlegmonous changes are present as well as a separate intraperitoneal rim enhancing collection within the central mesentery measuring 5.6 x 2.5 x 3.1 cm. 4. Small volume simple attenuation  ascites. No free intraperitoneal air is seen at this time. 5. Bilateral pleural effusions, right greater than left with adjacent passive atelectasis in the lung bases. 6. Gas within the urinary bladder, correlate for recent instrumentation. 7. Stable exophytic lesion arising from the lower pole left kidney. Previously demonstrating central enhancement on comparison contrast-enhanced study. Consider outpatient MRI when patient is better able to tolerate the study. 8. Aortic Atherosclerosis (ICD10-I70.0). These results were called by telephone at the time of interpretation on 01/06/2020 at 4:41 pm to provider [facility] , who verbally acknowledged these results. Electronically Signed   By: Lovena Le M.D.   On: 01/06/2020 16:41   DG Chest 2 View  Result Date: 01/06/2020 CLINICAL DATA:  Cough and dyspnea. EXAM: CHEST - 2 VIEW COMPARISON:  December 17, 2019 FINDINGS: The nasogastric tube, feeding tube and right internal jugular venous catheter seen on the prior study have been removed. Mild atelectasis and/or infiltrate is seen within the left lung base. There is mild blunting of the bilateral costophrenic angles. No pneumothorax is identified. The heart size and mediastinal contours are within normal limits. Multilevel degenerative changes seen throughout the thoracic spine. Radiopaque surgical clips are seen overlying the right upper quadrant. IMPRESSION: 1. Mild atelectasis and/or infiltrate within the left lung base. 2. Small bilateral pleural effusions. Electronically Signed   By: Virgina Norfolk M.D.   On: 01/06/2020 21:17   DG Abd 1 View  Result Date: 12/14/2019 CLINICAL DATA:  74 year old with acute pancreatitis and acute onset of abdominal distention. EXAM: ABDOMEN - 1 VIEW COMPARISON:  CT abdomen and pelvis yesterday. FINDINGS: Since the CT yesterday, development of borderline to mild gaseous distension of small bowel throughout the abdomen. Gas within normal caliber ascending colon. Remainder of  the colon decompressed as on yesterday's CT. Residual contrast material in the renal parenchyma bilaterally and in the urinary bladder related to yesterday's CT, query acute kidney injury. No suggestion of free air on the supine image. Surgical clips in the RIGHT UPPER QUADRANT from prior cholecystectomy.  IMPRESSION: 1. Mild generalized ileus. No evidence of bowel obstruction currently. 2. Residual contrast material in the renal parenchyma bilaterally and in the urinary bladder related to yesterday's CT, query acute kidney injury/renal insufficiency. Electronically Signed   By: Evangeline Dakin M.D.   On: 12/14/2019 09:37   CT Angio Chest PE W and/or Wo Contrast  Result Date: 12/13/2019 CLINICAL DATA:  74 year old female with lower abdominal pain. Shortness of breath. EXAM: CT ANGIOGRAPHY CHEST CT ABDOMEN AND PELVIS WITH CONTRAST TECHNIQUE: Multidetector CT imaging of the chest was performed using the standard protocol during bolus administration of intravenous contrast. Multiplanar CT image reconstructions and MIPs were obtained to evaluate the vascular anatomy. Multidetector CT imaging of the abdomen and pelvis was performed using the standard protocol during bolus administration of intravenous contrast. CONTRAST:  56mL OMNIPAQUE IOHEXOL 350 MG/ML SOLN COMPARISON:  Chest CT 01/11/2016 and CT abdomen 12/02/2013 FINDINGS: CTA CHEST FINDINGS Cardiovascular: Negative for pulmonary embolism. Normal caliber of the thoracic aorta. Heart size is within normal limits. No significant pericardial fluid. Great vessels patent. Mediastinum/Nodes: Normal appearance of mediastinal structures. No significant node enlargement in the mediastinum or hilar regions. There are small right axillary lymph nodes. However, these right axillary lymph nodes have clearly enlarged from the exam in 2017. Index lymph node measures up to 8 mm on sequence 1, image 21 and this lymph node was barely perceptible on the prior examination. Mild  lymphadenopathy in the right axilla is nonspecific. No significant left axillary lymph node enlargement. Lungs/Pleura: Trachea and mainstem bronchi are patent. Dependent atelectasis in both lower lobes. Again noted are small scattered pulmonary nodules. Index lesion in the right middle lobe on sequence 4, image 46 measures 4 mm and stable. There are additional small scattered pulmonary nodules predominantly along the pleural surfaces. Majority of these small pulmonary nodules are stable. Limited evaluation of the lung bases due to the atelectasis. No large pleural effusions. Question a new 2 mm nodule in the periphery of the right lower lobe on sequence 4, image 51. Musculoskeletal: No acute bone abnormality. Review of the MIP images confirms the above findings. CT ABDOMEN and PELVIS FINDINGS Hepatobiliary: Diffuse low-attenuation of the liver is compatible with steatosis. Gallbladder has been removed. Subtle low-density along periphery of the posterior right hepatic lobe on sequence 4, image 20 is nonspecific. There is mild intrahepatic biliary dilatation. Mild dilatation of the common bile duct measuring up to 9 mm on sequence 4, image 38. Large amount of edema and stranding in the porta hepatis. There is a calcification near the distal common bile duct and this is compatible with an obstructing bile duct stone. Pancreas: Extensive edema and inflammatory changes in the upper abdomen centered around the pancreas. Pancreatic enhancement is heterogeneous and compatible with edema and possibly areas of pancreatic necrosis. No discrete fluid or pseudocyst formations. No significant pancreatic duct dilatation. Spleen: Normal in size without focal abnormality. Adrenals/Urinary Tract: Normal adrenal glands. Stable appearance of the right kidney. Tiny hypodensities along the right kidney lower pole are suggestive for small cysts. Right extrarenal pelvis is unchanged without right hydronephrosis. Again noted is a exophytic  cyst in the left kidney lower pole. There is a new area of decreased attenuation along the posterior left kidney lower pole region on sequence 4, image 50. The central aspect of the low-density area is hyperdense and may be enhancing. Based on the sagittal reformats, this could represent a renal infarct or focal pyelonephritis. Difficult to exclude an atypical lesion at this location. Negative  for left hydronephrosis. Normal appearance of the urinary bladder. Indeterminate low-density structure in the medial left kidney on sequence 9 image 27 measures up to 1.0 cm. Stomach/Bowel: Diverticulosis in the sigmoid colon without colonic inflammation. Normal appearance stomach. No evidence for small or large bowel struck shin. Calcification near the distal common bile duct is likely at the ampulla. 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 Main visceral arteries are patent. No abdominopelvic lymphadenopathy. Portal venous system is patent. Splenic vein is patent. Reproductive: Hysterectomy. New or enlarged low-density structure involving the right ovary on sequence 4, image 71. No evidence for a left adnexal lesion. Other: Large amount of edema in the central abdominal mesentery and around the duodenum and porta hepatis. No ascites in pelvis. Negative free air. Musculoskeletal: Focal sclerosis in the left ilium on sequence 4, image 69 is stable. No acute bone abnormality. Review of the MIP images confirms the above findings. IMPRESSION: 1. Acute pancreatitis likely secondary to an obstructive stone in the distal common bile duct near the ampulla. 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 No evidence for pseudocyst formations at time. Mild intrahepatic and extrahepatic biliary dilatation. Subtle low-density along the posterior right hepatic lobe may be related to inflammation but this area is indeterminate. 2. Indeterminate left renal lesions. Unusual  lesion or hypodensities in the posterior left kidney lower pole could represent areas of infarct, focal pyelonephritis or atypical neoplastic lesion. There is also a new indeterminate 1 cm lesion in the medial left kidney. Recommend follow-up renal MRI when patient is stable and can not tolerate an abdominal MRI. 3. Negative for pulmonary embolism.  Volume loss at the lung bases. 4. Chronic small pulmonary nodules. Index pulmonary nodule in right middle lobe has not significantly changed since 2017. 5. **An incidental finding of potential clinical significance has been found. Mildly enlarged right axillary lymph nodes of uncertain etiology. These lymph nodes are small but have enlarged since 2017.** 6. **An incidental finding of potential clinical significance has been found. 2.5 cm low-density structure in the right ovary. This could represent an ovarian or adnexal cyst but atypical for a patient of this age. Recommend follow-up pelvic ultrasound.** These results were called by telephone at the time of interpretation on 12/13/2019 at 4:25 pm to provider [facility] , who verbally acknowledged these results. Electronically Signed   By: Markus Daft M.D.   On: 12/13/2019 16:35   CT ABDOMEN PELVIS W CONTRAST  Result Date: 12/13/2019 CLINICAL DATA:  74 year old female with lower abdominal pain. Shortness of breath. EXAM: CT ANGIOGRAPHY CHEST CT ABDOMEN AND PELVIS WITH CONTRAST TECHNIQUE: Multidetector CT imaging of the chest was performed using the standard protocol during bolus administration of intravenous contrast. Multiplanar CT image reconstructions and MIPs were obtained to evaluate the vascular anatomy. Multidetector CT imaging of the abdomen and pelvis was performed using the standard protocol during bolus administration of intravenous contrast. CONTRAST:  30mL OMNIPAQUE IOHEXOL 350 MG/ML SOLN COMPARISON:  Chest CT 01/11/2016 and CT abdomen 12/02/2013 FINDINGS: CTA CHEST FINDINGS Cardiovascular: Negative for  pulmonary embolism. Normal caliber of the thoracic aorta. Heart size is within normal limits. No significant pericardial fluid. Great vessels patent. Mediastinum/Nodes: Normal appearance of mediastinal structures. No significant node enlargement in the mediastinum or hilar regions. There are small right axillary lymph nodes. However, these right axillary lymph nodes have clearly enlarged from the exam in 2017. Index lymph node measures up to 8 mm on sequence 1, image 21 and this lymph node  was barely perceptible on the prior examination. Mild lymphadenopathy in the right axilla is nonspecific. No significant left axillary lymph node enlargement. Lungs/Pleura: Trachea and mainstem bronchi are patent. Dependent atelectasis in both lower lobes. Again noted are small scattered pulmonary nodules. Index lesion in the right middle lobe on sequence 4, image 46 measures 4 mm and stable. There are additional small scattered pulmonary nodules predominantly along the pleural surfaces. Majority of these small pulmonary nodules are stable. Limited evaluation of the lung bases due to the atelectasis. No large pleural effusions. Question a new 2 mm nodule in the periphery of the right lower lobe on sequence 4, image 51. Musculoskeletal: No acute bone abnormality. Review of the MIP images confirms the above findings. CT ABDOMEN and PELVIS FINDINGS Hepatobiliary: Diffuse low-attenuation of the liver is compatible with steatosis. Gallbladder has been removed. Subtle low-density along periphery of the posterior right hepatic lobe on sequence 4, image 20 is nonspecific. There is mild intrahepatic biliary dilatation. Mild dilatation of the common bile duct measuring up to 9 mm on sequence 4, image 38. Large amount of edema and stranding in the porta hepatis. There is a calcification near the distal common bile duct and this is compatible with an obstructing bile duct stone. Pancreas: Extensive edema and inflammatory changes in the upper  abdomen centered around the pancreas. Pancreatic enhancement is heterogeneous and compatible with edema and possibly areas of pancreatic necrosis. No discrete fluid or pseudocyst formations. No significant pancreatic duct dilatation. Spleen: Normal in size without focal abnormality. Adrenals/Urinary Tract: Normal adrenal glands. Stable appearance of the right kidney. Tiny hypodensities along the right kidney lower pole are suggestive for small cysts. Right extrarenal pelvis is unchanged without right hydronephrosis. Again noted is a exophytic cyst in the left kidney lower pole. There is a new area of decreased attenuation along the posterior left kidney lower pole region on sequence 4, image 50. The central aspect of the low-density area is hyperdense and may be enhancing. Based on the sagittal reformats, this could represent a renal infarct or focal pyelonephritis. Difficult to exclude an atypical lesion at this location. Negative for left hydronephrosis. Normal appearance of the urinary bladder. Indeterminate low-density structure in the medial left kidney on sequence 9 image 27 measures up to 1.0 cm. Stomach/Bowel: Diverticulosis in the sigmoid colon without colonic inflammation. Normal appearance stomach. No evidence for small or large bowel struck shin. Calcification near the distal common bile duct is likely at the ampulla. 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 Main visceral arteries are patent. No abdominopelvic lymphadenopathy. Portal venous system is patent. Splenic vein is patent. Reproductive: Hysterectomy. New or enlarged low-density structure involving the right ovary on sequence 4, image 71. No evidence for a left adnexal lesion. Other: Large amount of edema in the central abdominal mesentery and around the duodenum and porta hepatis. No ascites in pelvis. Negative free air. Musculoskeletal: Focal sclerosis in the left ilium on sequence 4, image 69 is stable.  No acute bone abnormality. Review of the MIP images confirms the above findings. IMPRESSION: 1. Acute pancreatitis likely secondary to an obstructive stone in the distal common bile duct near the ampulla. 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 No evidence for pseudocyst formations at time. Mild intrahepatic and extrahepatic biliary dilatation. Subtle low-density along the posterior right hepatic lobe may be related to inflammation but this area is indeterminate. 2. Indeterminate left renal lesions. Unusual lesion or hypodensities in the posterior left kidney lower pole  could represent areas of infarct, focal pyelonephritis or atypical neoplastic lesion. There is also a new indeterminate 1 cm lesion in the medial left kidney. Recommend follow-up renal MRI when patient is stable and can not tolerate an abdominal MRI. 3. Negative for pulmonary embolism.  Volume loss at the lung bases. 4. Chronic small pulmonary nodules. Index pulmonary nodule in right middle lobe has not significantly changed since 2017. 5. **An incidental finding of potential clinical significance has been found. Mildly enlarged right axillary lymph nodes of uncertain etiology. These lymph nodes are small but have enlarged since 2017.** 6. **An incidental finding of potential clinical significance has been found. 2.5 cm low-density structure in the right ovary. This could represent an ovarian or adnexal cyst but atypical for a patient of this age. Recommend follow-up pelvic ultrasound.** These results were called by telephone at the time of interpretation on 12/13/2019 at 4:25 pm to provider [facility] , who verbally acknowledged these results. Electronically Signed   By: Markus Daft M.D.   On: 12/13/2019 16:35   US RENAL  Result Date: 12/15/2019 CLINICAL DATA:  Acute kidney injury. EXAM: RENAL / URINARY TRACT ULTRASOUND COMPLETE COMPARISON:  Abdominal CT 2 days prior 12/13/2019 FINDINGS: Right Kidney: Renal  measurements: 11.7 x 5.6 x 5.4 cm = volume: 186 mL. Echogenicity within normal limits. No mass or hydronephrosis visualized. Tiny hypodensity in the lower right kidney is not well visualized sonographically. Left Kidney: Renal measurements: 12.7 x 5.0 x 4.8 cm = volume: 157 mL. Echogenicity within normal limits. No solid mass or hydronephrosis visualized. Cyst measuring 1.5 x 1.6 x 1.2 cm in the lower kidney. The additional low-density lesion on prior CT is not well visualized. Bladder: Decompressed by Foley catheter. Other: Hepatic steatosis incidentally noted. IMPRESSION: 1. No hydronephrosis or obstructive uropathy. 2. Small cyst in the lower left kidney. Additional indeterminate left renal lesions on CT are not well visualized sonographically. As recommended on prior CT, elective follow-up renal protocol MRI is recommended when patient is able and can tolerate breath hold technique. Electronically Signed   By: Keith Rake M.D.   On: 12/15/2019 15:01   IR Fluoro Guide CV Line Right  Result Date: 12/27/2019 CLINICAL DATA:  Renal failure and need for tunneled hemodialysis catheter. Current right jugular temporary non tunneled dialysis catheter in place. This is needed currently for IV access and conscious sedation for the tunnel catheter placement. EXAM: TUNNELED CENTRAL VENOUS HEMODIALYSIS CATHETER PLACEMENT WITH ULTRASOUND AND FLUOROSCOPIC GUIDANCE ANESTHESIA/SEDATION: 1.0 mg IV Versed; 50 mcg IV Fentanyl. Total Moderate Sedation Time:   23 minutes. The patient's level of consciousness and physiologic status were continuously monitored during the procedure by Radiology nursing. MEDICATIONS: 2 g IV Ancef. FLUOROSCOPY TIME:  30 seconds.  3.0 mGy. PROCEDURE: The procedure, risks, benefits, and alternatives were explained to the patient. Questions regarding the procedure were encouraged and answered. The patient understands and consents to the procedure. A timeout was performed prior to initiating the  procedure. Ultrasound was used to confirm patency of the left internal jugular vein. The left neck and chest were prepped with chlorhexidine in a sterile fashion, and a sterile drape was applied covering the operative field. Maximum barrier sterile technique with sterile gowns and gloves were used for the procedure. Local anesthesia was provided with 1% lidocaine. After creating a small venotomy incision, a 21 gauge needle was advanced into the left internal jugular vein under direct, real-time ultrasound guidance. Ultrasound image documentation was performed. After securing guidewire access, an 8  Fr dilator was placed. A J-wire was kinked to measure appropriate catheter length. A Palindrome tunneled hemodialysis catheter measuring 23 cm from tip to cuff was chosen for placement. This was tunneled in a retrograde fashion from the chest wall to the venotomy incision. At the venotomy, serial dilatation was performed and a 16 Fr peel-away sheath was placed over a guidewire. The catheter was then placed through the sheath and the sheath removed. Final catheter positioning was confirmed and documented with a fluoroscopic spot image. The catheter was aspirated, flushed with saline, and injected with appropriate volume heparin dwells. The venotomy incision was closed with subcuticular 4-0 Vicryl. Dermabond was applied to the incision. The catheter exit site was secured with 0-Prolene retention sutures. COMPLICATIONS: None.  No pneumothorax. FINDINGS: After catheter placement, the tip lies in the right atrium. The catheter aspirates normally and is ready for immediate use. IMPRESSION: Placement of tunneled hemodialysis catheter via the left internal jugular vein. The catheter tip lies in the right atrium. The catheter is ready for immediate use. Electronically Signed   By: Aletta Edouard M.D.   On: 12/27/2019 10:13   IR Removal Tun Cv Cath W/O FL  Result Date: 01/03/2020 INDICATION: Patient with AK I in the setting of  sepsis since resolved presents for removal of HD catheter EXAM: REMOVAL TUNNELED CENTRAL VENOUS CATHETER MEDICATIONS: No antibiotic was administered prior to this procedure; ANESTHESIA/SEDATION: Moderate (conscious) sedation was not utilized performance of this procedure. FLUOROSCOPY TIME:  Fluoroscopy Time: None COMPLICATIONS: None immediate. PROCEDURE: Informed written consent was obtained from the patient after a thorough discussion of the procedural risks, benefits and alternatives. All questions were addressed. Maximal Sterile Barrier Technique was utilized including caps, mask, sterile gowns, sterile gloves, sterile drape, hand hygiene and skin antiseptic. A timeout was performed prior to the initiation of the procedure. The patient's left chest and catheter was prepped and draped in a normal sterile fashion. Heparin was removed from both ports of catheter. Using gentle blunt dissection the cuff of the catheter was exposed and the catheter was removed in it's entirety. Pressure was held till hemostasis was obtained. A sterile dressing was applied. The patient tolerated the procedure well with no immediate complications. IMPRESSION: Successful catheter removal as described above. Read by Rushie Nyhan NP Electronically Signed   By: Corrie Mckusick D.O.   On: 01/03/2020 16:18   DG CHEST PORT 1 VIEW  Result Date: 12/17/2019 CLINICAL DATA:  Acute respiratory failure. EXAM: PORTABLE CHEST 1 VIEW COMPARISON:  12/16/2019 FINDINGS: Right IJ catheter tip is at the cavoatrial junction. Feeding tube and NG tube are identified. The tips are both well below the level of the GE junction. Heart size is normal. Unchanged bilateral pleural effusions. Mild bibasilar atelectasis. IMPRESSION: Stable bilateral pleural effusions with bibasilar atelectasis. Electronically Signed   By: Kerby Moors M.D.   On: 12/17/2019 07:42   DG Chest Port 1 View  Result Date: 12/16/2019 CLINICAL DATA:  Complication of central venous  catheter EXAM: PORTABLE CHEST 1 VIEW COMPARISON:  12/15/2019, 12/16/2019 FINDINGS: Single frontal view of the chest demonstrates stable position of the right internal jugular central venous catheter overlying superior vena cava. There are 2 enteric catheters extending below the diaphragm, tips excluded by collimation. Cardiac silhouette is stable. Bibasilar veiling opacities, right greater than left, consistent with consolidation and effusion. No pneumothorax. IMPRESSION: 1. Support devices as above. 2. Stable bibasilar veiling opacities consistent with consolidation and effusions. Electronically Signed   By: Diana Eves.D.  On: 12/16/2019 20:33   DG CHEST PORT 1 VIEW  Result Date: 12/16/2019 CLINICAL DATA:  Acute respiratory failure EXAM: PORTABLE CHEST 1 VIEW COMPARISON:  12/14/2018 FINDINGS: Gastric catheter and right jugular dialysis catheter are noted and stable. Cardiac shadow is within normal limits. Small bilateral pleural effusions are seen with bibasilar atelectasis. These have increased slightly in the interval from the prior exam IMPRESSION: Slight increase in pleural effusion particularly on the right. Bibasilar atelectatic changes are noted. Electronically Signed   By: Inez Catalina M.D.   On: 12/16/2019 09:23   DG Chest Port 1 View  Result Date: 12/15/2019 CLINICAL DATA:  74 year old female central line placement. EXAM: PORTABLE CHEST 1 VIEW COMPARISON:  1436 hours today. FINDINGS: Portable AP semi upright view at 1607 hours. Right IJ approach central line placed, tip at the level of the lower SVC. No pneumothorax. Stable cardiac size and mediastinal contours. Stable low lung volumes. Mildly increased perihilar platelike opacity most resembling atelectasis. Otherwise stable ventilation. Enteric tube also placed and courses to the stomach with side hole at the level of the gastric body. There is a small volume of contrast in the gastric fundus. IMPRESSION: 1. Right IJ central line placed,  tip at the lower SVC level. No adverse features. 2. Enteric tube placed, side hole at the level of the gastric body. 3. Continued low lung volumes with increased perihilar atelectasis from earlier today. Electronically Signed   By: Genevie Ann M.D.   On: 12/15/2019 16:22   DG CHEST PORT 1 VIEW  Result Date: 12/15/2019 CLINICAL DATA:  Hypoxia, abdominal pain EXAM: PORTABLE CHEST 1 VIEW COMPARISON:  12/14/2019 FINDINGS: Single frontal view of the chest demonstrates a stable cardiac silhouette. Continued low lung volumes. Increasing consolidation at the left lung base. No large effusion or pneumothorax. IMPRESSION: 1. Progressive left lower lobe consolidation favor atelectasis. Electronically Signed   By: Randa Ngo M.D.   On: 12/15/2019 15:07   DG Chest Port 1 View  Result Date: 12/14/2019 CLINICAL DATA:  74 year old with acute pancreatitis and shortness of breath. EXAM: PORTABLE CHEST 1 VIEW COMPARISON:  12/29/2018 and earlier. FINDINGS: Markedly suboptimal inspiration accounts for atelectasis in the lung bases, LEFT greater than RIGHT. Lungs otherwise clear. Pulmonary vascularity normal. Cardiac silhouette normal in size for AP portable technique and degree of inspiration. IMPRESSION: Markedly suboptimal inspiration accounts for bibasilar atelectasis, LEFT greater than RIGHT. Electronically Signed   By: Evangeline Dakin M.D.   On: 12/14/2019 09:38   DG ERCP BILIARY & PANCREATIC DUCTS  Result Date: 12/14/2019 CLINICAL DATA:  74 year old female with choledocholithiasis EXAM: ERCP TECHNIQUE: Multiple spot images obtained with the fluoroscopic device and submitted for interpretation post-procedure. FLUOROSCOPY TIME:  Fluoroscopy Time:  1 minutes 34 seconds Radiation Exposure Index (if provided by the fluoroscopic device): 24.6 mGy Number of Acquired Spot Images: 0 COMPARISON:  CT scan of the abdomen and pelvis 12/13/2019 FINDINGS: Two intraoperative saved images are submitted for review. The images  demonstrate a flexible endoscope in the descending duodenum with wire cannulation of the common bile duct. The second image demonstrates placement of a plastic biliary stent. IMPRESSION: ERCP with placement of plastic biliary stent. These images were submitted for radiologic interpretation only. Please see the procedural report for the amount of contrast and the fluoroscopy time utilized. Electronically Signed   By: Jacqulynn Cadet M.D.   On: 12/14/2019 14:37   DG Abd Portable 1V  Result Date: 12/16/2019 CLINICAL DATA:  Feeding tube placement EXAM: PORTABLE ABDOMEN - 1  VIEW COMPARISON:  None. FINDINGS: An NG tube terminates in the stomach. The feeding tube terminates near the ligament of Treitz in the left side of the abdomen. IMPRESSION: The feeding tube terminates near the ligament of Treitz in the left side of the abdomen. The NG tube is in good position. Electronically Signed   By: Dorise Bullion III M.D   On: 12/16/2019 15:22   DG Abd Portable 1V  Result Date: 12/15/2019 CLINICAL DATA:  Hypoxia, abdominal pain EXAM: PORTABLE ABDOMEN - 1 VIEW COMPARISON:  12/14/2019 FINDINGS: Supine frontal views of the abdomen and pelvis demonstrate common bile duct stent overlying right upper quadrant. Dilated gas-filled loops of small bowel in the left mid abdomen measure up to 3.3 cm in diameter. There is a relative paucity of distal bowel gas. No masses or abnormal calcifications. IMPRESSION: 1. Common bile duct stent as above. 2. Nonspecific gaseous distention of the small bowel, which could reflect postprocedural ileus. Electronically Signed   By: Randa Ngo M.D.   On: 12/15/2019 15:08    Labs:  CBC: Recent Labs    01/03/20 1215 01/04/20 1734 01/06/20 1339 01/07/20 0727  WBC 8.2 15.2* 16.8* 7.2  HGB 9.5* 9.6* 8.9* 7.4*  HCT 29.4* 30.4* 28.7* 24.0*  PLT 182 191 180 152    COAGS: Recent Labs    01/10/19 1114  INR 1.0  APTT 29    BMP: Recent Labs    01/03/20 1215 01/04/20 1734  01/06/20 1339 01/07/20 0727  NA 137 134* 133* 140  K 3.3* 4.0 3.7 4.0  CL 101 100 98 108  CO2 23 21* 21* 21*  GLUCOSE 162* 315* 252* 221*  BUN 40* 38* 31* 25*  CALCIUM 8.8* 8.8* 8.9 8.3*  CREATININE 2.94* 2.65* 1.91* 1.79*  GFRNONAA 15* 17* 25* 27*  GFRAA 17* 20* 29* 32*    LIVER FUNCTION TESTS: Recent Labs    01/02/20 0340 01/03/20 1215 01/04/20 1734 01/06/20 1339  BILITOT 1.2 0.8 0.7 1.1  AST 29 18 23 18   ALT 5 10 13 12   ALKPHOS 93 108 112 135*  PROT 5.2* 5.9* 5.8* 6.9  ALBUMIN 2.4* 2.6* 2.8* 3.3*    TUMOR MARKERS: No results for input(s): AFPTM, CEA, CA199, CHROMGRNA in the last 8760 hours.  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 Drainage of large pancreatic fluid collection.  Patient s/p recent ERCP with stent placement for severe acute gallstone pancreatitis.  Her vital signs are currently stable.  WBC improved from 15.2  7.2 overnight.  Afebrile.  On meropenem.  Case reviewed by Dr. Pascal Lux.  Consideration for aspiration vs. Drainage will require multidisciplinary approach. Recommend consultation with GI due to prior stent placement as well as possible need for cystogastrostomy, and surgery as procedure would place patient at high risk for chronic drainage.  Patient recently required dialysis due to acute kidney injury which has now resolved.  May also need to consult Nephrology for consideration of CT Abdomen Pelvis with contrast to determine whether collection would be amenable to GI intervention.   IR following and remains available, although hopeful chronic drainage can avoided, if possible.  No procedure planned at this time as patient currently stable with medical management.    Thank you for this interesting consult.  I greatly enjoyed meeting Deetra Booton Vasallo and look forward to participating in their care.  A copy of this report was sent to the requesting provider on  this date.  Electronically Signed:  Docia Barrier, PA 01/07/2020, 9:35 AM   I spent a total of 40 Minutes    in face to face in clinical consultation, greater than 50% of which was counseling/coordinating care for necrotizing pancreatitis.

## 2020-01-07 NOTE — Progress Notes (Signed)
Pt admitted to [facility] from [facility] via [facility]. Pt alert and oriented;recently discharged from this unit and demonstrates how to use callbell and states she will not get up without assist. Pt complaining of abdominal pain rated "10". [facility] paged for pain med and admission orders.

## 2020-01-07 NOTE — Progress Notes (Signed)
Patient seen and examined.  Recent extensive hospitalization, see H&P done earlier today by nighttime hospitalist.  Patient complains of diffuse abdominal pain.  #1. acute necrotic, pancreatitis with hemorrhage, abdominal pain: Hemodynamically stable at this time, no evidence of compartment syndrome, localized cyst and hemorrhage. Renal functions improving. -Consulted with interventional radiology, they recommended GI and surgery consult. -Called and discussed case with general surgery, they recommended careful monitoring but no surgical intervention possible. -We will discuss with gastroenterology, if any endoscopy procedure, internal drainage questionable possible. Continue IV fluids.  Continue meropenem.  Continue close monitoring and serial abdominal exam. Since patient does not have peritonitis and has normal bowel function today, will allow her clear liquid diet. Her liver function tests look fairly stable.  Her lipase is normal.

## 2020-01-08 LAB — COMPREHENSIVE METABOLIC PANEL
ALT: 10 U/L (ref 0–44)
AST: 12 U/L — ABNORMAL LOW (ref 15–41)
Albumin: 2.1 g/dL — ABNORMAL LOW (ref 3.5–5.0)
Alkaline Phosphatase: 104 U/L (ref 38–126)
Anion gap: 9 (ref 5–15)
BUN: 23 mg/dL (ref 8–23)
CO2: 23 mmol/L (ref 22–32)
Calcium: 8.3 mg/dL — ABNORMAL LOW (ref 8.9–10.3)
Chloride: 105 mmol/L (ref 98–111)
Creatinine, Ser: 1.68 mg/dL — ABNORMAL HIGH (ref 0.44–1.00)
GFR calc Af Amer: 34 mL/min — ABNORMAL LOW (ref >60)
GFR calc non Af Amer: 29 mL/min — ABNORMAL LOW (ref >60)
Glucose, Bld: 190 mg/dL — ABNORMAL HIGH (ref 70–99)
Potassium: 3.9 mmol/L (ref 3.5–5.1)
Sodium: 137 mmol/L (ref 135–145)
Total Bilirubin: 0.7 mg/dL (ref 0.3–1.2)
Total Protein: 5 g/dL — ABNORMAL LOW (ref 6.5–8.1)

## 2020-01-08 LAB — CBC WITH DIFFERENTIAL/PLATELET
Abs Immature Granulocytes: 0.04 10*3/uL (ref 0.00–0.07)
Basophils Absolute: 0 10*3/uL (ref 0.0–0.1)
Basophils Relative: 0 %
Eosinophils Absolute: 0 10*3/uL (ref 0.0–0.5)
Eosinophils Relative: 0 %
HCT: 24.9 % — ABNORMAL LOW (ref 36.0–46.0)
Hemoglobin: 7.6 g/dL — ABNORMAL LOW (ref 12.0–15.0)
Immature Granulocytes: 1 %
Lymphocytes Relative: 8 %
Lymphs Abs: 0.6 10*3/uL — ABNORMAL LOW (ref 0.7–4.0)
MCH: 27.4 pg (ref 26.0–34.0)
MCHC: 30.5 g/dL (ref 30.0–36.0)
MCV: 89.9 fL (ref 80.0–100.0)
Monocytes Absolute: 1.1 10*3/uL — ABNORMAL HIGH (ref 0.1–1.0)
Monocytes Relative: 15 %
Neutro Abs: 5.5 10*3/uL (ref 1.7–7.7)
Neutrophils Relative %: 76 %
Platelets: 181 10*3/uL (ref 150–400)
RBC: 2.77 MIL/uL — ABNORMAL LOW (ref 3.87–5.11)
RDW: 15.3 % (ref 11.5–15.5)
WBC: 7.2 10*3/uL (ref 4.0–10.5)
nRBC: 0 % (ref 0.0–0.2)

## 2020-01-08 LAB — MAGNESIUM: Magnesium: 1.1 mg/dL — ABNORMAL LOW (ref 1.7–2.4)

## 2020-01-08 LAB — GLUCOSE, CAPILLARY
Glucose-Capillary: 183 mg/dL — ABNORMAL HIGH (ref 70–99)
Glucose-Capillary: 163 mg/dL — ABNORMAL HIGH (ref 70–99)
Glucose-Capillary: 216 mg/dL — ABNORMAL HIGH (ref 70–99)

## 2020-01-08 LAB — PHOSPHORUS: Phosphorus: 3.2 mg/dL (ref 2.5–4.6)

## 2020-01-08 MED ORDER — PROMETHAZINE HCL 25 MG/ML IJ SOLN
12.5000 mg | Freq: Four times a day (QID) | INTRAMUSCULAR | Status: DC | PRN
  Administered 2020-01-08 – 2020-01-11 (×2): 12.5 mg via INTRAVENOUS
  Filled 2020-01-08 (×2): qty 1

## 2020-01-08 MED ORDER — PANCRELIPASE (LIP-PROT-AMYL) 12000-38000 UNITS PO CPEP
36000.0000 [IU] | ORAL_CAPSULE | Freq: Three times a day (TID) | ORAL | Status: DC
  Administered 2020-01-08: 36000 [IU] via ORAL
  Filled 2020-01-08: qty 3

## 2020-01-08 MED ORDER — MAGNESIUM SULFATE 4 GM/100ML IV SOLN
4.0000 g | Freq: Once | INTRAVENOUS | Status: AC
  Administered 2020-01-08: 4 g via INTRAVENOUS
  Filled 2020-01-08: qty 100

## 2020-01-08 MED ORDER — ENSURE ENLIVE PO LIQD
237.0000 mL | Freq: Two times a day (BID) | ORAL | Status: DC
  Administered 2020-01-08 – 2020-01-10 (×3): 237 mL via ORAL

## 2020-01-08 MED ORDER — LACTATED RINGERS IV SOLN: INTRAVENOUS | Status: DC

## 2020-01-08 NOTE — Progress Notes (Signed)
PROGRESS NOTE    Katherine Oliver  GYJ:[id number] DOB: June 10, 1946 DOA: 01/07/2020 PCP: [name], MD    Brief Narrative:  74 year old female with history of diabetes, prior cholecystectomy in 2003, recent extensive hospitalization due to choledocholithiasis and necrotic pancreatitis, renal failure requiring hemodialysis who was discharged 2 days ago with planned outpatient follow-up.  She was diagnosed with necrotizing pancreatitis, underwent ERCP and stenting, had protracted hospitalization.  Continue to have poor appetite and abdominal pain so presented back to the [facility] hospital. In the emergency room, hemodynamically stable, repeat imaging studies showed increasing necrotic collection in the pancreas that replaced entire pancreas.  Also showed intraperitoneal and retroperitoneal phlegmonous changes.   Assessment & Plan:   Principal Problem:   Acute necrotizing pancreatitis Active Problems:   HTN (hypertension)   AKI (acute kidney injury) (HCC)   AF (paroxysmal atrial fibrillation) (HCC)   Acute pancreatitis  Acute necrotizing pancreatitis, pancreatic cyst formation suspected hemorrhages into the pancreatic cyst: Admitted with ongoing pain and unable to take oral pain medications to relieve pain. Continue maintenance IV fluids, continue adequate IV opiates and oral opiates for pain relief.  Intake and output monitoring.  She could tolerate clear liquid diet, will advance to full liquid diet.  Mobility. We will add pancrelipase enzymes. Discussed with surgery, currently no peritoneal complications, suggested no intervention. Discussed with interventional radiology, suggested no external drainage. Continue supportive treatment, will discuss with gastroenterology whether she is ready for gastro cystostomy and repeat ERCP. Started on meropenem given leukocytosis, extensive phlegmon.  Most likely inflammatory than infectious, however will continue antibiotics  until clinical improvement.  Type 2 diabetes: Controlled.  On Metformin at home.  We will continue sliding scale insulin while in the hospital.  Hypertension: Blood pressure stable.  On amlodipine.  Paroxysmal A. fib: Currently rate controlled on sinus rhythm.  On amiodarone.  Continue.  Acute kidney injury: Continues to improve.  She underwent 1 hemodialysis session last admission.  Urine output is adequate.  Renal functions continue to improve.  Will need close monitoring.  Hypomagnesemia: Replace aggressively and recheck levels.  Anemia: Due to chronic disease.  Hemoglobin is more than 7.  Continue to monitor.  Transfuse for less than 7.   DVT prophylaxis: SCDs Code Status: Full code Family Communication: Husband at the bedside Disposition Plan: patient is from home. Anticipated DC to home, Barriers to discharge, continuous pain, needing IV fluids to maintain hydration status.   Consultants:   Interventional radiology  Procedures:   None  Antimicrobials:   Meropenem, 01/06/2020>>>>   Subjective: Patient seen and examined.  Her pain is somehow better than yesterday.  She was able to drink some liquid.  Had 3 or 4 episodes of loose watery stool in 24 hours.  Denies any active vomiting.  Appetite is poor.  Objective: Vitals:   01/07/20 0827 01/07/20 1334 01/07/20 2120 01/08/20 0544  BP: 137/70 (!) 135/57 136/64 (!) 147/57  Pulse:  (!) 105 99 99  Resp:  18  17  Temp:  98.6 F (37 C) 98.7 F (37.1 C) 98.5 F (36.9 C)  TempSrc:  Oral Oral Oral  SpO2:  92% 94% 93%  Weight:      Height:        Intake/Output Summary (Last 24 hours) at 01/08/2020 1144 Last data filed at 01/08/2020 0900 Gross per 24 hour  Intake 710 ml  Output --  Net 710 ml   Filed Weights   01/06/20 2336  Weight: 80.2 kg  Examination:  General exam: Appears calm and comfortable, chronically sick looking. Respiratory system: Clear to auscultation. Respiratory effort normal.  No added  sounds. Cardiovascular system: S1 & S2 heard, RRR. No JVD, murmurs,  Gastrointestinal system: Abdomen is distended, diffuse mild tenderness with no rigidity or guarding.  Bowel sounds are present.  Central nervous system: Alert and oriented. No focal neurological deficits. Extremities: Symmetric 5 x 5 power. Skin: No rashes, lesions or ulcers Psychiatry: Judgement and insight appear normal. Mood & affect appropriate.     Data Reviewed: I have personally reviewed following labs and imaging studies  CBC: Recent Labs  Lab 01/02/20 0340 01/02/20 0340 01/03/20 1215 01/04/20 1734 01/06/20 1339 01/07/20 0727 01/08/20 0235  WBC 8.1   < > 8.2 15.2* 16.8* 7.2 7.2  NEUTROABS 6.6  --  6.9  --   --   --  5.5  HGB 9.4*   < > 9.5* 9.6* 8.9* 7.4* 7.6*  HCT 28.7*   < > 29.4* 30.4* 28.7* 24.0* 24.9*  MCV 85.9   < > 85.5 89.4 88.0 88.2 89.9  PLT 203   < > 182 191 180 152 181   < > = values in this interval not displayed.   Basic Metabolic Panel: Recent Labs  Lab 01/02/20 0340 01/02/20 0340 01/03/20 1215 01/04/20 1734 01/06/20 1339 01/07/20 0727 01/08/20 0235  NA 135   < > 137 134* 133* 140 137  K 4.0   < > 3.3* 4.0 3.7 4.0 3.9  CL 101   < > 101 100 98 108 105  CO2 22   < > 23 21* 21* 21* 23  GLUCOSE 145*   < > 162* 315* 252* 221* 190*  BUN 40*   < > 40* 38* 31* 25* 23  CREATININE 3.84*   < > 2.94* 2.65* 1.91* 1.79* 1.68*  CALCIUM 8.6*   < > 8.8* 8.8* 8.9 8.3* 8.3*  MG 1.9  --  1.5*  --   --   --  1.1*  PHOS 4.3  --  3.8  --   --   --  3.2   < > = values in this interval not displayed.   GFR: Estimated Creatinine Clearance: 27.8 mL/min (A) (by C-G formula based on SCr of 1.68 mg/dL (H)). Liver Function Tests: Recent Labs  Lab 01/02/20 0340 01/03/20 1215 01/04/20 1734 01/06/20 1339 01/08/20 0235  AST 29 18 23 18  12*  ALT 5 10 13 12 10   ALKPHOS 93 108 112 135* 104  BILITOT 1.2 0.8 0.7 1.1 0.7  PROT 5.2* 5.9* 5.8* 6.9 5.0*  ALBUMIN 2.4* 2.6* 2.8* 3.3* 2.1*   Recent Labs   Lab 01/04/20 1734 01/06/20 1339  LIPASE 35 27   No results for input(s): AMMONIA in the last 168 hours. Coagulation Profile: No results for input(s): INR, PROTIME in the last 168 hours. Cardiac Enzymes: No results for input(s): CKTOTAL, CKMB, CKMBINDEX, TROPONINI in the last 168 hours. BNP (last 3 results) No results for input(s): PROBNP in the last 8760 hours. HbA1C: No results for input(s): HGBA1C in the last 72 hours. CBG: Recent Labs  Lab 01/06/20 2234 01/07/20 1137 01/07/20 1854 01/07/20 2121 01/08/20 0545  GLUCAP 185* 160* 260* 229* 183*   Lipid Profile: No results for input(s): CHOL, HDL, LDLCALC, TRIG, CHOLHDL, LDLDIRECT in the last 72 hours. Thyroid Function Tests: No results for input(s): TSH, T4TOTAL, FREET4, T3FREE, THYROIDAB in the last 72 hours. Anemia Panel: No results for input(s): VITAMINB12, FOLATE, FERRITIN, TIBC, IRON,  RETICCTPCT in the last 72 hours. Sepsis Labs: No results for input(s): PROCALCITON, LATICACIDVEN in the last 168 hours.  Recent Results (from the past 240 hour(s))  Respiratory Panel by RT PCR (Flu A&B, Covid) - Nasopharyngeal Swab     Status: None   Collection Time: 01/06/20  5:16 PM   Specimen: Nasopharyngeal Swab  Result Value Ref Range Status   SARS Coronavirus 2 by RT PCR NEGATIVE NEGATIVE Final    Comment: (NOTE) SARS-CoV-2 target nucleic acids are NOT DETECTED. The SARS-CoV-2 RNA is generally detectable in upper respiratoy specimens during the acute phase of infection. The lowest concentration of SARS-CoV-2 viral copies this assay can detect is 131 copies/mL. A negative result does not preclude SARS-Cov-2 infection and should not be used as the sole basis for treatment or other patient management decisions. A negative result may occur with  improper specimen collection/handling, submission of specimen other than nasopharyngeal swab, presence of viral mutation(s) within the areas targeted by this assay, and inadequate number  of viral copies (<131 copies/mL). A negative result must be combined with clinical observations, patient history, and epidemiological information. The expected result is Negative. Fact Sheet for Patients:  PinkCheek.be Fact Sheet for Healthcare Providers:  GravelBags.it This test is not yet ap proved or cleared by the [place] FDA and  has been authorized for detection and/or diagnosis of SARS-CoV-2 by FDA under an Emergency Use Authorization (EUA). This EUA will remain  in effect (meaning this test can be used) for the duration of the COVID-19 declaration under Section 564(b)(1) of the Act, 21 U.S.C. section 360bbb-3(b)(1), unless the authorization is terminated or revoked sooner.    Influenza A by PCR NEGATIVE NEGATIVE Final   Influenza B by PCR NEGATIVE NEGATIVE Final    Comment: (NOTE) The Xpert Xpress SARS-CoV-2/FLU/RSV assay is intended as an aid in  the diagnosis of influenza from Nasopharyngeal swab specimens and  should not be used as a sole basis for treatment. Nasal washings and  aspirates are unacceptable for Xpert Xpress SARS-CoV-2/FLU/RSV  testing. Fact Sheet for Patients: PinkCheek.be Fact Sheet for Healthcare Providers: GravelBags.it This test is not yet approved or cleared by the [place] FDA and  has been authorized for detection and/or diagnosis of SARS-CoV-2 by  FDA under an Emergency Use Authorization (EUA). This EUA will remain  in effect (meaning this test can be used) for the duration of the  Covid-19 declaration under Section 564(b)(1) of the Act, 21  U.S.C. section 360bbb-3(b)(1), unless the authorization is  terminated or revoked. Performed at [facility], [address], [place], [place] [address]          Radiology Studies: CT ABDOMEN PELVIS WO CONTRAST  Result Date: 01/06/2020 CLINICAL DATA:  Lower abdominal  pain increasing over the past several days EXAM: CT ABDOMEN AND PELVIS WITHOUT CONTRAST TECHNIQUE: Multidetector CT imaging of the abdomen and pelvis was performed following the standard protocol without IV contrast. COMPARISON:  CT abdomen pelvis 12/13/2019 FINDINGS: Lower chest: Bilateral pleural effusions, right greater than left with adjacent passive atelectasis in the lung bases. Cardiac size is within normal limits. Extensive coronary artery calcifications noted. Calcifications also present on the aortic leaflets. Trace pericardial effusion is within physiologic normal. Hepatobiliary: There is pneumobilia within the anti dependent portions of the left lobe liver. Postsurgical changes from prior cholecystectomy. A common bile duct stent is in place, displaced to the right by the large pancreatic fluid collections detailed below. No gross intra or hepatic or extrahepatic ductal  dilatation is seen. Distal tip of this biliary stent appears positioned within the duodenal lumen though the bowel is largely collapsed secondary to mass effect as well. Pancreas: An interval development of a large heterogeneous collection centered upon the pancreas, replacing much of the pancreatic parenchyma. Given configuration, difficult to accurately measure though there is a maximal AP thickness of up to 7 cm with medial-lateral extent of 16 cm and a craniocaudal extent of 8 cm (coronal 5/40). Several sites of higher attenuation material within this collection are worrisome for acute hemorrhage given the attenuation on this noncontrast study (5/36). Extensive severe intra peritoneal and retroperitoneal phlegmonous changes are present as well as a separate intraperitoneal rim enhancing collection extending inferolaterally within the central mesentery measuring 5.6 x 2.5 x 3.1 cm (5/36, 6/47). Spleen: Normal in size without focal abnormality. Adrenals/Urinary Tract: Normal adrenal glands. Stable exophytic cystic lesion arising from  the lower pole left kidney demonstrating increased attenuation on prior contrast enhanced study. No concerning renal masses, urolithiasis or hydronephrosis. Mild bilateral symmetric perinephric stranding, a nonspecific which could be reactive or correlate with diminished renal function, advanced patient age, or infection. Gas is noted within the urinary bladder, correlate for recent instrumentation. Stomach/Bowel: Distal esophagus is unremarkable. Some gastric wall thickening is likely reactive due to inflammation in the lesser sac. Lateral displacement and compression of the duodenum by the mass effect from the pancreatic collection detailed above. No other small bowel dilatation or wall thickening. The appendix is poorly visualized but without focal pericecal inflammation to suggest acute appendicitis. No colonic dilatation or wall thickening. Scattered colonic diverticula without focal pericolonic inflammation to suggest diverticulitis. Vascular/Lymphatic: Atherosclerotic plaque within the normal caliber aorta. Evaluation of the vasculature is limited in the absence of contrast media. Reactive adenopathy throughout the abdomen and pelvis. No pathologically enlarged nodes. Reproductive: Uterus is surgically absent. No concerning adnexal lesions. Other: Peripancreatic in mid abdominal fluid collections as detailed above. Small volume simple attenuation ascites noted in the perihepatic space and perisplenic spaces as well as layering in the deep pelvis. No free intraperitoneal air is seen at this time. Musculoskeletal: Multilevel degenerative changes are present in the imaged portions of the spine. No acute osseous abnormality or suspicious osseous lesion. IMPRESSION: 1. Interval development of a large heterogeneous collection centered upon the pancreas, replacing much of the pancreatic parenchyma. Given time course from initial onset pancreatitis symptoms (12/13/2019) findings are compatible with an acute necrotic  collection as per the revised [facility] classification of acute pancreatitis. 2. Several sites of higher attenuation material within this collection are worrisome for acute hemorrhage given the attenuation on this noncontrast study consider correlation with CBC. 3. Extensive intra peritoneal and retroperitoneal phlegmonous changes are present as well as a separate intraperitoneal rim enhancing collection within the central mesentery measuring 5.6 x 2.5 x 3.1 cm. 4. Small volume simple attenuation ascites. No free intraperitoneal air is seen at this time. 5. Bilateral pleural effusions, right greater than left with adjacent passive atelectasis in the lung bases. 6. Gas within the urinary bladder, correlate for recent instrumentation. 7. Stable exophytic lesion arising from the lower pole left kidney. Previously demonstrating central enhancement on comparison contrast-enhanced study. Consider outpatient MRI when patient is better able to tolerate the study. 8. Aortic Atherosclerosis (ICD10-I70.0). These results were called by telephone at the time of interpretation on 01/06/2020 at 4:41 pm to provider [facility] , who verbally acknowledged these results. Electronically Signed   By: Lovena Le M.D.   On: 01/06/2020 16:41  DG Chest 2 View  Result Date: 01/06/2020 CLINICAL DATA:  Cough and dyspnea. EXAM: CHEST - 2 VIEW COMPARISON:  December 17, 2019 FINDINGS: The nasogastric tube, feeding tube and right internal jugular venous catheter seen on the prior study have been removed. Mild atelectasis and/or infiltrate is seen within the left lung base. There is mild blunting of the bilateral costophrenic angles. No pneumothorax is identified. The heart size and mediastinal contours are within normal limits. Multilevel degenerative changes seen throughout the thoracic spine. Radiopaque surgical clips are seen overlying the right upper quadrant. IMPRESSION: 1. Mild atelectasis and/or infiltrate within the left lung base.  2. Small bilateral pleural effusions. Electronically Signed   By: Virgina Norfolk M.D.   On: 01/06/2020 21:17        Scheduled Meds: . amiodarone  200 mg Oral Daily  . amLODipine  2.5 mg Oral Daily  . insulin aspart  0-9 Units Subcutaneous Q6H  . lipase/protease/amylase  36,000 Units Oral TID WC  . nystatin cream   Topical BID  . pantoprazole  40 mg Oral BID AC  . sucralfate  1 g Oral Q6H   Continuous Infusions: . magnesium sulfate bolus IVPB    . meropenem (MERREM) IV 500 mg (01/08/20 0940)     LOS: 1 day    Time spent: 30 minutes    Barb Merino, MD [facility] Pager [phone]

## 2020-01-09 ENCOUNTER — Other Ambulatory Visit: Payer: Self-pay | Admitting: *Deleted

## 2020-01-09 ENCOUNTER — Inpatient Hospital Stay (HOSPITAL_COMMUNITY): Payer: Medicare Other

## 2020-01-09 LAB — COMPREHENSIVE METABOLIC PANEL
ALT: 12 U/L (ref 0–44)
AST: 15 U/L (ref 15–41)
Albumin: 2.2 g/dL — ABNORMAL LOW (ref 3.5–5.0)
Alkaline Phosphatase: 115 U/L (ref 38–126)
Anion gap: 9 (ref 5–15)
BUN: 22 mg/dL (ref 8–23)
CO2: 22 mmol/L (ref 22–32)
Calcium: 8.5 mg/dL — ABNORMAL LOW (ref 8.9–10.3)
Chloride: 102 mmol/L (ref 98–111)
Creatinine, Ser: 1.5 mg/dL — ABNORMAL HIGH (ref 0.44–1.00)
GFR calc Af Amer: 39 mL/min — ABNORMAL LOW (ref >60)
GFR calc non Af Amer: 34 mL/min — ABNORMAL LOW (ref >60)
Glucose, Bld: 192 mg/dL — ABNORMAL HIGH (ref 70–99)
Potassium: 3.5 mmol/L (ref 3.5–5.1)
Sodium: 133 mmol/L — ABNORMAL LOW (ref 135–145)
Total Bilirubin: 0.8 mg/dL (ref 0.3–1.2)
Total Protein: 5.2 g/dL — ABNORMAL LOW (ref 6.5–8.1)

## 2020-01-09 LAB — CBC WITH DIFFERENTIAL/PLATELET
Abs Immature Granulocytes: 0.05 10*3/uL (ref 0.00–0.07)
Basophils Absolute: 0 10*3/uL (ref 0.0–0.1)
Basophils Relative: 1 %
Eosinophils Absolute: 0 10*3/uL (ref 0.0–0.5)
Eosinophils Relative: 0 %
HCT: 25.3 % — ABNORMAL LOW (ref 36.0–46.0)
Hemoglobin: 7.9 g/dL — ABNORMAL LOW (ref 12.0–15.0)
Immature Granulocytes: 1 %
Lymphocytes Relative: 13 %
Lymphs Abs: 0.7 10*3/uL (ref 0.7–4.0)
MCH: 27.6 pg (ref 26.0–34.0)
MCHC: 31.2 g/dL (ref 30.0–36.0)
MCV: 88.5 fL (ref 80.0–100.0)
Monocytes Absolute: 0.9 10*3/uL (ref 0.1–1.0)
Monocytes Relative: 16 %
Neutro Abs: 4 10*3/uL (ref 1.7–7.7)
Neutrophils Relative %: 69 %
Platelets: 197 10*3/uL (ref 150–400)
RBC: 2.86 MIL/uL — ABNORMAL LOW (ref 3.87–5.11)
RDW: 15.3 % (ref 11.5–15.5)
WBC: 5.7 10*3/uL (ref 4.0–10.5)
nRBC: 0 % (ref 0.0–0.2)

## 2020-01-09 LAB — GLUCOSE, CAPILLARY
Glucose-Capillary: 154 mg/dL — ABNORMAL HIGH (ref 70–99)
Glucose-Capillary: 170 mg/dL — ABNORMAL HIGH (ref 70–99)
Glucose-Capillary: 171 mg/dL — ABNORMAL HIGH (ref 70–99)
Glucose-Capillary: 205 mg/dL — ABNORMAL HIGH (ref 70–99)
Glucose-Capillary: 222 mg/dL — ABNORMAL HIGH (ref 70–99)
Glucose-Capillary: 270 mg/dL — ABNORMAL HIGH (ref 70–99)
Glucose-Capillary: 344 mg/dL — ABNORMAL HIGH (ref 70–99)

## 2020-01-09 LAB — PHOSPHORUS: Phosphorus: 2.3 mg/dL — ABNORMAL LOW (ref 2.5–4.6)

## 2020-01-09 LAB — MAGNESIUM: Magnesium: 1.9 mg/dL (ref 1.7–2.4)

## 2020-01-09 MED ORDER — OSMOLITE 1.2 CAL PO LIQD: 1000.0000 mL | ORAL | Status: DC

## 2020-01-09 MED ORDER — MAGNESIUM SULFATE IN D5W 1-5 GM/100ML-% IV SOLN
1.0000 g | Freq: Once | INTRAVENOUS | Status: AC
  Administered 2020-01-09: 1 g via INTRAVENOUS
  Filled 2020-01-09: qty 100

## 2020-01-09 MED ORDER — VITAL AF 1.2 CAL PO LIQD
1000.0000 mL | ORAL | Status: DC
  Administered 2020-01-09 – 2020-01-10 (×3): 1000 mL
  Filled 2020-01-09 (×5): qty 1000

## 2020-01-09 MED ORDER — LORAZEPAM 2 MG/ML IJ SOLN
1.0000 mg | Freq: Once | INTRAMUSCULAR | Status: AC
  Administered 2020-01-09: 1 mg via INTRAVENOUS

## 2020-01-09 MED ORDER — POTASSIUM PHOSPHATES 15 MMOLE/5ML IV SOLN
20.0000 mmol | Freq: Once | INTRAVENOUS | Status: AC
  Administered 2020-01-09: 20 mmol via INTRAVENOUS
  Filled 2020-01-09: qty 6.67

## 2020-01-09 MED ORDER — LORAZEPAM 2 MG/ML IJ SOLN
INTRAMUSCULAR | Status: AC
  Filled 2020-01-09: qty 1

## 2020-01-09 MED ORDER — FREE WATER
150.0000 mL | Freq: Four times a day (QID) | Status: DC
  Administered 2020-01-09 – 2020-01-11 (×8): 150 mL

## 2020-01-09 MED ORDER — INSULIN ASPART 100 UNIT/ML ~~LOC~~ SOLN
0.0000 [IU] | SUBCUTANEOUS | Status: DC
  Administered 2020-01-10: 7 [IU] via SUBCUTANEOUS
  Administered 2020-01-10: 5 [IU] via SUBCUTANEOUS
  Administered 2020-01-10 (×2): 7 [IU] via SUBCUTANEOUS
  Administered 2020-01-10: 5 [IU] via SUBCUTANEOUS

## 2020-01-09 NOTE — Progress Notes (Signed)
Pt and pt husband would like to set up an advanced directive for the patient. Attempted to call spiritual care but did not receive an answer.

## 2020-01-09 NOTE — Progress Notes (Signed)
PT Cancellation Note  Patient Details Name: Shaquela Weichert MRN: [id number] DOB: Aug 24, 1946   Cancelled Treatment:    Reason Eval/Treat Not Completed: Patient at procedure or test/unavailable. Suzanna Obey Chasitty Hehl 01/09/2020, 11:21 AM [name], PT Acute Rehabilitation Services Pager: [phone] Office: [phone]

## 2020-01-09 NOTE — Plan of Care (Signed)
  Problem: Education: Goal: Knowledge of Pancreatitis treatment and prevention will improve Outcome: Progressing   Problem: Clinical Measurements: Goal: Complications related to the disease process, condition or treatment will be avoided or minimized Outcome: Progressing   Problem: Education: Goal: Knowledge of General Education information will improve Description: Including pain rating scale, medication(s)/side effects and non-pharmacologic comfort measures Outcome: Progressing   Problem: Health Behavior/Discharge Planning: Goal: Ability to manage health-related needs will improve Outcome: Progressing   Problem: Clinical Measurements: Goal: Ability to maintain clinical measurements within normal limits will improve Outcome: Progressing Goal: Will remain free from infection Outcome: Progressing Goal: Diagnostic test results will improve Outcome: Progressing Goal: Respiratory complications will improve Outcome: Progressing Goal: Cardiovascular complication will be avoided Outcome: Progressing   Problem: Coping: Goal: Level of anxiety will decrease Outcome: Progressing   Problem: Elimination: Goal: Will not experience complications related to bowel motility Outcome: Progressing Goal: Will not experience complications related to urinary retention Outcome: Progressing

## 2020-01-09 NOTE — Patient Outreach (Signed)
[place] [facility]) Care Management [place] Telephone Outreach- [facility] Red Alert notification- documentation only PCP office completes Transition of Care follow up post-hospital discharge  01/09/2020  [name] [facility] 08-13-1946 [id number]  EMMI Red Alert notification/ General Discharge EMMI Call date/ day #: Friday January 06, 2020; day # 1 Red Alert reason(s): "questions about discharge papers;" no scheduled follow up" "not taking meds;" "not healing;" have questions/ problems"  Received EMMI red-Alert notification as above from [place] this morning re:  Katherine Oliver, 74 y/o female referred to [place] by [facility] Liaison during extended inpatient hospitalization February 9- January 03, 2020 for pancreatitis related to gallstones/ UTI; patient had multiple complications during hospitalization, including sepsis, new onset A-Fib with RVR; GI bleeding; and acute renal failure requiring hemodialysis. Patient was discharged home to self care with home health services in place ([name]) for PT.Patient has history including, but not limited to, DM-II; HTN/ HLD; GERD; and CKD-III.  Patient was engaged in [place] services on Thursday, January 05, 2020 after her hospital discharge; at that time, patient noted to have multiple clinical concerns post-hospital discharge on January 03, 2020.  Patient had office visit with PCP on Friday, January 06, 2020 and was advised to return to the ED, where she was re-admitted for ongoing clinical concerns and was diagnosed with acute necrotizing pancreatitis; she was sent urgently by [facility] from ED at [facility] back to [facility] and is currently admitted with pending discharge plans.  Plan:  All red-Alert items addressed with patient at time of initial [facility] RN CM outreach on January 05, 2020 (see note dated 01/05/20); EMMI red-Alert call deferred as patient was re-admitted to hospital on Friday 01/06/20  [name], RN, BSN, [employer] Coordinator [facility] Care Management  [phone]

## 2020-01-09 NOTE — Progress Notes (Signed)
Initial Nutrition Assessment  RD working remotely.  DOCUMENTATION CODES:   Obesity unspecified  INTERVENTION:   -D/c Ensure Enlive -Once feeding access is established:  Initiate Vital AF 1.2 @ 20 ml/hr via cortrak tube and increase by 10 ml every 8 hours to goal rate of 68ml/hr.   150 ml free water flush every 6 hours  Tube feeding regimen provides 1728 kcal (100% of needs), 108 grams of protein, and 1168 ml of H2O. Total free water: 1768  NUTRITION DIAGNOSIS:   Inadequate oral intake related to altered GI function as evidenced by NPO status.  GOAL:   Patient will meet greater than or equal to 90% of their needs  MONITOR:   Diet advancement, Labs, Weight trends, Skin, I & O's, TF tolerance  REASON FOR ASSESSMENT:   Other (Comment)    ASSESSMENT:   Katherine Oliver is a 74 y.o. female with medical history significant of recent hospitalization for gallstone pancreatitis and choledocholithiasis had stent placed via ERCP with GI service also had issues with A. fib and acute kidney injury was on dialysis briefly.  She was discharged 2 days ago.  She was also diagnosed with necrotizing pancreatitis at that time and was told that they cannot remove her gallbladder until her pancreas issues got better.  She was on a lot of IV narcotics while she was here along with oral Vicodin.  She reports she left 2 days ago her pain was not well controlled she went home with Vicodin which did not help her in the hospital 2 days ago and her pain got worse.  She denies any fevers.  She denies any nausea vomiting or diarrhea.  Patient transferred here from [facility] because of persistent necrotizing pancreatitis but her main issue is poorly controlled pain at discharge.The morphine she is receiving is helping her a lot.  Pt admitted with acute necrotizing pancreatitis.   Reviewed I/O's: +1.8 L x 24 hours and -2.2 L since admission  Returned paged by RN, who reports plan to place cortrak  tube today. Case discussed with RD on cortrak tube team, who reports she is aware of consult and about the place tube on patient; MD requesting post-pyloric placement.  Case discussed with Dr. Therisa Doyne via secure chat, who gave this RD verbal permission to start TF once cortrak tube is placed. Per GI notes, plan to discuss with Dr. Rush Landmark regarding pt's candidacy for cyst gastrostomy.   Attempted to speak with pt via phone, however, no answer. RD is familiar with this pt from prior prolonged hospitalization for AKI and acute pancreatitis, just discharged last week. Pt with fair appetite PTA. Per notes, pt now NPO, as she was unable to tolerate liquids.   Reviewed wt hx; no wt loss over the past 6 months.   Given pt's acute necrotizing pancreatitis, pt would benefit from semi-elemental formula (Vital AF 1.2) to promote tolerance. Vital AF 1.2 contains a hydrolyzed, peptide based protein system, MCT/fish oil structured lipid, scFOS to promote tolerance and absorption.   Lab Results  Component Value Date   HGBA1C 6.4 (H) 12/14/2019   PTA DM medications are none.   Labs reviewed: Na: 133, Phos: 2.3, CBGS: 170-205 (inpatient orders for glycemic control are 0-9 units insulin aspart every 6 hours).   Diet Order:   Diet Order            Diet NPO time specified  Diet effective now              EDUCATION  NEEDS:   No education needs have been identified at this time  Skin:  Skin Assessment: Skin Integrity Issues: Skin Integrity Issues:: Other (Comment) Other: MASD to groin and perineum  Last BM:  01/06/20  Height:   Ht Readings from Last 1 Encounters:  01/06/20 5\' 1"  (1.549 m)    Weight:   Wt Readings from Last 1 Encounters:  01/09/20 82.3 kg    Ideal Body Weight:  47.7 kg  BMI:  Body mass index is 34.28 kg/m.  Estimated Nutritional Needs:   Kcal:  1700-1900  Protein:  95-110 grams  Fluid:  > 1.7 L    Loistine Chance, RD, LDN, [place] Registered Dietitian II Certified  Diabetes Care and Education Specialist Please refer to [facility] for RD and/or RD on-call/weekend/after hours pager

## 2020-01-09 NOTE — Evaluation (Signed)
Occupational Therapy Evaluation Patient Details Name: Katherine Oliver MRN: [id number] DOB: 1945/11/18 Today's Date: 01/09/2020    History of Present Illness 74 yo admitted 2/9 with necrotic pancreatitis and UTI s/p ERCP with Afib 2/12 and renal failure requiring HD. D/C on 3/2 with return 3/5 due to poor appetite and abdominal pain. Repeat imaging studies showed increasing necrotic collection in the pancreas that replaced entire pancreas.  Also showed intraperitoneal and retroperitoneal phlegmonous changes. PMHx: DM, HTN, CKD, obesity, sepsis, ARF, Rt TKA   Clinical Impression   This 74 y/o female presents with the above. Pt with recent admit and discharge home, reports since most recent discharge home she has been using RW for mobility tasks, reports spouse assisting with dressing ADL. Pt currently with limitations due to pain and generalized fatigue. Pt able to perform room level mobility using RW, toileting and standing grooming ADL at supervision level and given intermittent seated rest breaks. She currently requires minguard-minA for LB ADL. Pt reports she lives at home with spouse who can assist with ADL PRN. She will benefit from continued acute OT services to maximize her safety and independence with ADL and mobility prior to discharge. Do not anticipate pt will require follow up OT services. Will follow.     Follow Up Recommendations  No OT follow up;Supervision - Intermittent    Equipment Recommendations  None recommended by OT           Precautions / Restrictions Precautions Precautions: Fall Restrictions Weight Bearing Restrictions: No      Mobility Bed Mobility Overal bed mobility: Needs Assistance Bed Mobility: Supine to Sit;Sit to Supine     Supine to sit: Supervision Sit to supine: Min guard   General bed mobility comments: use of rail, supervision-minguard for safety, increased effort but no physical assist required  Transfers Overall transfer level:  Needs assistance Equipment used: Rolling walker (2 wheeled) Transfers: Sit to/from Stand Sit to Stand: Supervision         General transfer comment: Supervision for safety, standing from multiple surfaces during session    Balance Overall balance assessment: Needs assistance Sitting-balance support: Feet supported Sitting balance-Leahy Scale: Good     Standing balance support: Bilateral upper extremity supported;During functional activity Standing balance-Leahy Scale: Fair                             ADL either performed or assessed with clinical judgement   ADL Overall ADL's : Needs assistance/impaired Eating/Feeding: NPO   Grooming: Set up;Supervision/safety;Wash/dry hands;Wash/dry face;Brushing hair;Sitting;Standing Grooming Details (indicate cue type and reason): initially performing grooming ADL in sitting per pt request due to fatigue, performed seated on BSC at sink, washing hands in standing after toileting ADL Upper Body Bathing: Set up;Sitting   Lower Body Bathing: Minimal assistance;Sit to/from stand   Upper Body Dressing : Set up;Sitting   Lower Body Dressing: Min guard;Minimal assistance;Sit to/from stand Lower Body Dressing Details (indicate cue type and reason): pt doffing/donning clean underwear while seated on toilet, minguard for safety in standing Toilet Transfer: Supervision/safety;Ambulation;RW   Toileting- Clothing Manipulation and Hygiene: Supervision/safety;Sitting/lateral lean;Sit to/from stand Toileting - Clothing Manipulation Details (indicate cue type and reason): completing clothing management and pericare without assist     Functional mobility during ADLs: Supervision/safety;Rolling walker                           Pertinent Vitals/Pain Pain Assessment: 0-10 Pain  Score: 7  Pain Location: abdomen Pain Descriptors / Indicators: Discomfort;Aching Pain Intervention(s): Limited activity within patient's tolerance;Monitored  during session;Premedicated before session;Repositioned     Hand Dominance Right   Extremity/Trunk Assessment Upper Extremity Assessment Upper Extremity Assessment: Overall WFL for tasks assessed   Lower Extremity Assessment Lower Extremity Assessment: Defer to PT evaluation   Cervical / Trunk Assessment Cervical / Trunk Assessment: Kyphotic   Communication Communication Communication: No difficulties   Cognition Arousal/Alertness: Awake/alert Behavior During Therapy: WFL for tasks assessed/performed Overall Cognitive Status: Within Functional Limits for tasks assessed                                     General Comments       Exercises     Shoulder Instructions      Home Living Family/patient expects to be discharged to:: Private residence Living Arrangements: Spouse/significant other Available Help at Discharge: Family;Available 24 hours/day Type of Home: House Home Access: Stairs to enter [employer] of Steps: 1 Entrance Stairs-Rails: None Home Layout: Two level;Able to live on main level with bedroom/bathroom     Bathroom Shower/Tub: Teacher, early years/pre: Standard     Home Equipment: Environmental consultant - 2 wheels;Cane - single point;Tub bench;Toilet riser;Grab bars - toilet;Other (comment);Bedside commode(reacher, sock aid)          Prior Functioning/Environment Level of Independence: Needs assistance  Gait / Transfers Assistance Needed: using RW since most recent discharge home ADL's / Homemaking Assistance Needed: husband assisting with some dressing including socks             OT Problem List: Decreased activity tolerance;Impaired balance (sitting and/or standing);Decreased safety awareness;Pain;Decreased strength;Decreased knowledge of use of DME or AE;Cardiopulmonary status limiting activity      OT Treatment/Interventions: Self-care/ADL training;Therapeutic exercise;DME and/or AE instruction;Therapeutic  activities;Patient/family education;Balance training;Energy conservation    OT Goals(Current goals can be found in the care plan section) Acute Rehab OT Goals Patient Stated Goal: return home OT Goal Formulation: With patient Time For Goal Achievement: 01/23/20 Potential to Achieve Goals: Good  OT Frequency: Min 2X/week   Barriers to D/C:            Co-evaluation              AM-PAC OT "6 Clicks" Daily Activity     Outcome Measure Help from another person eating meals?: Total(NPO) Help from another person taking care of personal grooming?: None Help from another person toileting, which includes using toliet, bedpan, or urinal?: A Little Help from another person bathing (including washing, rinsing, drying)?: A Little Help from another person to put on and taking off regular upper body clothing?: None Help from another person to put on and taking off regular lower body clothing?: A Little 6 Click Score: 18   End of Session Equipment Utilized During Treatment: Rolling walker Nurse Communication: Mobility status  Activity Tolerance: Patient tolerated treatment well Patient left: in bed;with call bell/phone within reach;with bed alarm set  OT Visit Diagnosis: Pain;Other abnormalities of gait and mobility (R26.89) Pain - part of body: (abdomen)                Time: 5284-1324 OT Time Calculation (min): 21 min Charges:  OT General Charges $OT Visit: 1 Visit OT Evaluation $OT Eval Moderate Complexity: [place], OT Acute Rehabilitation Services Pager [phone] Office [place] 01/09/2020, 9:42  AM

## 2020-01-09 NOTE — Consult Note (Signed)
   [facility] Inpatient Consult   01/09/2020  [name] [facility] [birth year] [id number]  Made aware of patient's admission by [facility] Management Coordinator.  Patient is currently active with [facility] for chronic disease management services.  Patient has been engaged by a [facility].  Our community based plan of care has focused on disease management and community resource support.    Chart reviewed reveals from the medical record which includes but not limited to from:  Katherine Oliver is a 74 y.o. female with history of hypertension, paroxysmal atrial fibrillation, diabetes, overweight was admitted on 01/07/2020 with complains of abdominal pain.  Plan: Follow patient for hospitalization and update team of disposition and needs.  [facility] services does not replace or interfere with any services that are needed or arranged by inpatient [facility] care management team.  For additional questions or referrals please contact:  Natividad Brood, RN BSN [facility] Liaison  [phone] business mobile phone Toll free office [phone]  Fax number: [phone] [name]

## 2020-01-09 NOTE — Progress Notes (Signed)
PROGRESS NOTE    Katherine Oliver  TFT:[id number] DOB: 08-Jul-1946 DOA: 01/07/2020 PCP: [name], MD    Brief Narrative:  74 year old female with history of diabetes, prior cholecystectomy in 2003, recent extensive hospitalization due to choledocholithiasis and necrotic pancreatitis, renal failure requiring hemodialysis who was discharged 2 days ago with planned outpatient follow-up.  She was diagnosed with necrotizing pancreatitis, underwent ERCP and stenting, had protracted hospitalization.  Continue to have poor appetite and abdominal pain so presented back to the [facility] hospital. In the emergency room, hemodynamically stable, repeat imaging studies showed increasing necrotic collection in the pancreas that replaced entire pancreas.  Also showed intraperitoneal and retroperitoneal phlegmonous changes.    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   Continue adequate oral and IV opiates for pain relief.  Continue maintenance fluid.   Patient is about 21 days from initial pancreatitis, poor nutrition.   Postpyloric tube placement and enteral feeding.  Will allow liquid diet for pleasure and flavor. High risk for refeeding syndrome, close monitoring of renal functions and magnesium and phosphorus. Did not tolerate pancreatic lipases. Discussed with surgery, currently no peritoneal complications, suggested no intervention. Discussed with interventional radiology, suggested no external drainage. Appreciate gastroenterology recommendations.  Not sure if she is ready for gastro cystostomy and repeat ERCP. Started on meropenem, likely  inflammatory, will discontinue.  Type 2 diabetes: Controlled.  On Metformin at home.  We will continue sliding scale insulin while in the hospital.  Hypertension: Blood pressure stable.  On amlodipine.  Paroxysmal A. fib: Currently rate controlled on sinus rhythm.  On amiodarone.  Continue.  Not on anticoagulation, hemorrhagic pancreatitis.  Acute kidney injury: Continues to improve.  She underwent 1 hemodialysis session last admission.  Urine output is adequate.  Renal functions continue to improve.  Will need close monitoring.  Hypomagnesemia: Replaced aggressively with improvement.  We will replace further.  Anemia: Due to chronic disease.  Hemoglobin is more than 7.  Continue to monitor.  Transfuse for less than 7.   DVT prophylaxis: SCDs Code Status: Full code Family Communication: Husband on the phone. Disposition Plan: patient is from home. Anticipated DC to home, Barriers to discharge, continuous pain, needing IV fluids to maintain hydration status.   Consultants:   Interventional radiology  GI  Procedures:   None  Antimicrobials:   Meropenem, 01/06/2020>01/09/2020   Subjective:  Patient seen and examined in the morning rounds.  No overnight events.  She could not tolerate any food last night.  Once she stopped eating, she started vomiting.  Still has diffuse pain all over and mostly on the left side of the abdomen.  Afebrile.  Objective: Vitals:   01/08/20 1303 01/08/20 2054 01/09/20 0421 01/09/20 0532  BP: (!) 130/56 130/66 136/67   Pulse: 97 100 100   Resp: 18 18 18    Temp: 97.8 F (36.6 C) 99.5 F (37.5 C) 98.5 F (36.9 C)   TempSrc: Oral Oral Oral   SpO2: 91% 92% 93%   Weight:    82.3 kg  Height:        Intake/Output Summary (Last 24 hours) at 01/09/2020 1350 Last data filed at 01/09/2020 1149 Gross per 24 hour  Intake 1963.21  ml  Output --  Net 1963.21 ml   Filed Weights   01/06/20 2336 01/09/20 0532  Weight: 80.2 kg 82.3 kg     Examination:  General exam: Appears comfortable however anxious.  Not in any distress. Respiratory system: Clear to auscultation. Respiratory effort normal.  No added sounds. Cardiovascular system: S1 & S2 heard, RRR. No JVD, murmurs,  Gastrointestinal system: Abdomen is distended, diffuse tenderness all over the abdomen with no rigidity or guarding.  Bowel sounds are present.  Central nervous system: Alert and oriented. No focal neurological deficits. Extremities: Symmetric 5 x 5 power. Skin: No rashes, lesions or ulcers Psychiatry: Judgement and insight appear normal. Mood & affect appropriate.     Data Reviewed: I have personally reviewed following labs and imaging studies  CBC: Recent Labs  Lab 01/03/20 1215 01/03/20 1215 01/04/20 1734 01/06/20 1339 01/07/20 0727 01/08/20 0235 01/09/20 0202  WBC 8.2   < > 15.2* 16.8* 7.2 7.2 5.7  NEUTROABS 6.9  --   --   --   --  5.5 4.0  HGB 9.5*   < > 9.6* 8.9* 7.4* 7.6* 7.9*  HCT 29.4*   < > 30.4* 28.7* 24.0* 24.9* 25.3*  MCV 85.5   < > 89.4 88.0 88.2 89.9 88.5  PLT 182   < > 191 180 152 181 197   < > = values in this interval not displayed.   Basic Metabolic Panel: Recent Labs  Lab 01/03/20 1215 01/03/20 1215 01/04/20 1734 01/06/20 1339 01/07/20 0727 01/08/20 0235 01/09/20 0202  NA 137   < > 134* 133* 140 137 133*  K 3.3*   < > 4.0 3.7 4.0 3.9 3.5  CL 101   < > 100 98 108 105 102  CO2 23   < > 21* 21* 21* 23 22  GLUCOSE 162*   < > 315* 252* 221* 190* 192*  BUN 40*   < > 38* 31* 25* 23 22  CREATININE 2.94*   < > 2.65* 1.91* 1.79* 1.68* 1.50*  CALCIUM 8.8*   < > 8.8* 8.9 8.3* 8.3* 8.5*  MG 1.5*  --   --   --   --  1.1* 1.9  PHOS 3.8  --   --   --   --  3.2 2.3*   < > = values in this interval not displayed.   GFR: Estimated Creatinine Clearance: 31.5 mL/min (A) (by C-G formula based on SCr of 1.5 mg/dL (H)). Liver Function Tests: Recent Labs  Lab 01/03/20 1215 01/04/20 1734 01/06/20 1339 01/08/20 0235  01/09/20 0202  AST 18 23 18  12* 15  ALT 10 13 12 10 12   ALKPHOS 108 112 135* 104 115  BILITOT 0.8 0.7 1.1 0.7 0.8  PROT 5.9* 5.8* 6.9 5.0* 5.2*  ALBUMIN 2.6* 2.8* 3.3* 2.1* 2.2*   Recent Labs  Lab 01/04/20 1734 01/06/20 1339  LIPASE 35 27   No results for input(s): AMMONIA in the last 168 hours. Coagulation Profile: No results for input(s): INR, PROTIME in the last 168 hours. Cardiac Enzymes: No results for input(s): CKTOTAL, CKMB, CKMBINDEX, TROPONINI in the last 168 hours. BNP (last 3 results) No results for input(s): PROBNP in the last 8760 hours. HbA1C: No results for input(s): HGBA1C in the last 72 hours. CBG: Recent Labs  Lab 01/08/20 2219 01/08/20 2359 01/09/20 0419 01/09/20 0550 01/09/20 1222  GLUCAP 216* 205* 170* 171* 154*   Lipid Profile: No results for input(s): CHOL, HDL, LDLCALC, TRIG, CHOLHDL, LDLDIRECT in  the last 72 hours. Thyroid Function Tests: No results for input(s): TSH, T4TOTAL, FREET4, T3FREE, THYROIDAB in the last 72 hours. Anemia Panel: No results for input(s): VITAMINB12, FOLATE, FERRITIN, TIBC, IRON, RETICCTPCT in the last 72 hours. Sepsis Labs: No results for input(s): PROCALCITON, LATICACIDVEN in the last 168 hours.  Recent Results (from the past 240 hour(s))  Respiratory Panel by RT PCR (Flu A&B, Covid) - Nasopharyngeal Swab     Status: None   Collection Time: 01/06/20  5:16 PM   Specimen: Nasopharyngeal Swab  Result Value Ref Range Status   SARS Coronavirus 2 by RT PCR NEGATIVE NEGATIVE Final    Comment: (NOTE) SARS-CoV-2 target nucleic acids are NOT DETECTED. The SARS-CoV-2 RNA is generally detectable in upper respiratoy specimens during the acute phase of infection. The lowest concentration of SARS-CoV-2 viral copies this assay can detect is 131 copies/mL. A negative result does not preclude SARS-Cov-2 infection and should not be used as the sole basis for treatment or other patient management decisions. A negative result may  occur with  improper specimen collection/handling, submission of specimen other than nasopharyngeal swab, presence of viral mutation(s) within the areas targeted by this assay, and inadequate number of viral copies (<131 copies/mL). A negative result must be combined with clinical observations, patient history, and epidemiological information. The expected result is Negative. Fact Sheet for Patients:  PinkCheek.be Fact Sheet for Healthcare Providers:  GravelBags.it This test is not yet ap proved or cleared by the [place] FDA and  has been authorized for detection and/or diagnosis of SARS-CoV-2 by FDA under an Emergency Use Authorization (EUA). This EUA will remain  in effect (meaning this test can be used) for the duration of the COVID-19 declaration under Section 564(b)(1) of the Act, 21 U.S.C. section 360bbb-3(b)(1), unless the authorization is terminated or revoked sooner.    Influenza A by PCR NEGATIVE NEGATIVE Final   Influenza B by PCR NEGATIVE NEGATIVE Final    Comment: (NOTE) The Xpert Xpress SARS-CoV-2/FLU/RSV assay is intended as an aid in  the diagnosis of influenza from Nasopharyngeal swab specimens and  should not be used as a sole basis for treatment. Nasal washings and  aspirates are unacceptable for Xpert Xpress SARS-CoV-2/FLU/RSV  testing. Fact Sheet for Patients: PinkCheek.be Fact Sheet for Healthcare Providers: GravelBags.it This test is not yet approved or cleared by the [place] FDA and  has been authorized for detection and/or diagnosis of SARS-CoV-2 by  FDA under an Emergency Use Authorization (EUA). This EUA will remain  in effect (meaning this test can be used) for the duration of the  Covid-19 declaration under Section 564(b)(1) of the Act, 21  U.S.C. section 360bbb-3(b)(1), unless the authorization is  terminated or  revoked. Performed at [facility], [address] [place], [place] [address]          Radiology Studies: DG Abd Portable 1V  Result Date: 01/09/2020 CLINICAL DATA:  Feeding tube placement. EXAM: PORTABLE ABDOMEN - 1 VIEW COMPARISON:  12/16/2019 FINDINGS: Enteric feeding tube tip projects at the level of the ligament of Treitz, well positioned. There is a biliary stent which extends from the second portion of the duodenum, based on the enteric tube placement, to just inferior and medially to cholecystectomy surgical clips. There is normal bowel gas pattern. IMPRESSION: 1. Well-positioned enteric tube Electronically Signed   By: Lajean Manes M.D.   On: 01/09/2020 12:25        Scheduled Meds: . amiodarone  200 mg Oral Daily  .  amLODipine  2.5 mg Oral Daily  . feeding supplement (ENSURE ENLIVE)  237 mL Oral BID BM  . feeding supplement (VITAL AF 1.2 CAL)  1,000 mL Per Tube Q24H  . free water  150 mL Per Tube Q6H  . insulin aspart  0-9 Units Subcutaneous Q6H  . LORazepam      . nystatin cream   Topical BID  . pantoprazole  40 mg Oral BID AC  . sucralfate  1 g Oral Q6H   Continuous Infusions: . lactated ringers 75 mL/hr at 01/09/20 1149  . magnesium sulfate bolus IVPB       LOS: 2 days    Time spent: 30 minutes    Barb Merino, MD [facility] Pager [phone]

## 2020-01-09 NOTE — Procedures (Signed)
Cortrak  Person Inserting Tube:  Margreat Widener, Creola Corn, RD Tube Type:  Cortrak - 43 inches Tube Location:  Left nare Initial Placement:  Postpyloric Secured by: Bridle Technique Used to Measure Tube Placement:  Documented cm marking at nare/ corner of mouth Cortrak Secured At:  95 cm    Cortrak Tube Team Note:  Consult received to place a Cortrak feeding tube.     X-ray is required, abdominal x-ray has been ordered by the Cortrak team. Please confirm tube placement before using the Cortrak tube.   If the tube becomes dislodged please keep the tube and contact the Cortrak team at [email or web address] ([email or web address]) for replacement.  If after hours and replacement cannot be delayed, place a NG tube and confirm placement with an abdominal x-ray.    Larkin Ina, MS, RD, LDN RD pager number and weekend/on-call pager number located in [facility].

## 2020-01-09 NOTE — Patient Outreach (Addendum)
[facility]) Care Management  01/09/2020  Katherine Oliver 07-May-1946 [id number]  [place] Telephone Outreach Care Coordination re:  Katherine Oliver, 74 y/o female referred to [place] by [facility] Liaison during extended inpatient hospitalization February 9- January 03, 2020 for pancreatitis related to gallstones/ UTI; patient had multiple complications during hospitalization, including sepsis, new onset A-Fib with RVR; GI bleeding; and acute renal failure requiring hemodialysis. Patient was discharged home to self care with home health services in place ([name]) for PT.  Patient has history including, but not limited to, DM-II; HTN/ HLD; GERD; and CKD-III.  Patient was engaged in [facility] services on Thursday, January 05, 2020 after her hospital discharge; at that time, patient noted to have multiple clinical concerns post-hospital discharge on January 03, 2020.  Patient had office visit with PCP on Friday, January 06, 2020 and was advised to return to the ED, where she was re-admitted for ongoing clinical concerns and was diagnosed with acute necrotizing pancreatitis; she was sent urgently by [facility] from ED at [facility] back to [facility] and is currently admitted with pending discharge plans.  Secure communication via EMR sent to [employer] and [place], and [facility] CSW [employer], notifying of patient's current hospital re-admission  Plan:  [place] RN CM will follow patient's progress while hospitalized and collaborate with [facility] liaison's for discharge disposition  ---- Message ----- From: Knox Royalty, RN Sent: 01/09/2020  10:39 AM EST To: Maryelizabeth Rowan, RN, Milda Smart, RN, * Subject: Patient re-admission/ 3 days after last disc*  Hi ladies, FYI- patient re-admitted with ongoing clinical issues post- hospital discharge, as below.  Ill be watching thorugh Epic; keep me posted on your ends.   Thanks, Nance Pear [name], RN, BSN, [place] Care Management  [phone]

## 2020-01-09 NOTE — Consult Note (Signed)
[facility] Consult  Referring Provider: Barb Merino, MD Primary Care Physician:  [name], MD Primary Gastroenterologist: Althia Forts  Reason for Consultation: Acute necrotizing pancreatitis with possibly hemorrhagic conversion and large fluid collections  HPI: Katherine Oliver is a 74 y.o. female with history of hypertension, paroxysmal atrial fibrillation, diabetes, overweight was admitted on 01/07/2020 with complains of abdominal pain. She was in the hospital between 12/13/2019 and discharged on 01/03/2020 after being admitted for biliary pancreatitis, status post ERCP with plastic stent placement on 12/14/2019 by Dr. Benson Norway.  She developed complications of worsening renal failure, possible abdominal compartment syndrome, atrial fibrillation with RVR, hypotension, possible GI bleed with an EGD on 12/19/2019 which showed multiple gastric ulcers, was started on hemodialysis, later had catheter removed.  Patient states she never recovered from pancreatitis and continued to have ongoing abdominal pain, nausea and vomiting.  She could not take her Metformin for diabetes because of renal failure, has never been on insulin(does not know how to use it, has not been prescribed) and had uncontrolled elevation in blood sugar. She was admitted at [facility] on 01/06/2020 was transferred to [facility]. CT abdomen from 01/06/2020 showed a large collection centered around pancreas replacing much of pancreatic parenchyma compatible with acute necrotic collection with several sites worrisome for acute hemorrhage, extensive intraperitoneal and retroperitoneal phlegmonous changes with a separate enhancing collection within central mesentery measuring 5.6 x 2.5 x 3.1 cm, bilateral pleural effusions right greater than left.  Patient denies fever but complains of early satiety, inability to tolerate liquids without associated nausea and vomiting.  She has had more diarrhea recently however her  last bowel movement was 2 days ago.  She complains of ongoing generalized abdominal pain worse in the right side, at least 7 out of 10 in intensity.   Past Medical History:  Diagnosis Date  . Allergic rhinitis   . Arthritis   . 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 nephropathy ([place])    DSME at [facility] 01/2016   . Frequent epistaxis 05/16/2019   S/p cauterization with resolution 2020  . GERD (gastroesophageal reflux disease)   . Hepatic steatosis    by abd [place] 05/2012, mild transaminitis - normal iron sat and viral hep panel (2011), stable [place] 2017  . History of chicken pox   . History of measles   . History of recurrent UTIs    on chronic keflex  . HLD (hyperlipidemia)   . HTN (hypertension)   . 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 Kidney cyst, acquired 01/2016   L kidney by [place]  . Kidney stone 01/2016   L kidney by [place]  . Lung nodules 11/2013   overall stable on f/u CT 01/2016  . Osteopenia 06/2013   mild, forearm T -1.1, hip and spine WNL  . Pancreatitis   . Polycythemia    mild, stable (2013)  . Primary localized osteoarthritis of right knee 01/05/2019  . Rosacea    metrogel    Past Surgical History:  Procedure Laterality Date  . APPENDECTOMY  1987  . BILIARY STENT PLACEMENT  12/14/2019   Procedure: BILIARY STENT PLACEMENT;  Surgeon: [name], MD;  Location: [place];  Service: Endoscopy;;  . BREAST BIOPSY Right 1963   benign  . BREAST BIOPSY Right 08/2014   benign- core  . cardiolite  stress test  04/2004   normal  . CESAREAN SECTION  0932;3557   x2  . CHOLECYSTECTOMY  2003  . COLONOSCOPY  09/26/2008   adenomatous polyp, rpt 3-5 yrs  . COLONOSCOPY  08/2012   adenomatous polyps, diverticulosis, rec rpt 5 yrs [name])  . COLONOSCOPY WITH PROPOFOL N/A 02/05/2018   4TA, SSA, diverticulosis, rpt 3 yrs  [name], [name], MD)  . dexa  2003   normal  . dexa  06/2013   [facility] - Tscore -1.1 forearm, normal spine and femur  . ERCP N/A 12/14/2019   Procedure: ENDOSCOPIC RETROGRADE CHOLANGIOPANCREATOGRAPHY (ERCP);  Surgeon: [name], MD;  Location: [place];  Service: Endoscopy;  Laterality: N/A;  . ESOPHAGOGASTRODUODENOSCOPY N/A 12/19/2019   Procedure: ESOPHAGOGASTRODUODENOSCOPY (EGD);  Surgeon: [name], MD;  Location[facility] ENDOSCOPY;  Service: Endoscopy;  Laterality: N/A;  . IR FLUORO GUIDE CV LINE RIGHT  12/27/2019  . IR REMOVAL TUN CV CATH W/O FL  01/03/2020  . SPHINCTEROTOMY  12/14/2019   Procedure: SPHINCTEROTOMY;  Surgeon: [name], MD;  Location: [place];  Service: Endoscopy;;  . 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 TRANSTHORACIC ECHOCARDIOGRAM  04/2019   EF 55-60%, modLVH, impaired relaxation   . TRIGGER FINGER RELEASE  2007;2010;2011   bilateral  . TRIGGER FINGER RELEASE  02/2017  . VAGINAL HYSTERECTOMY  1984   for menorrhagia, ovaries in place    Prior to Admission medications   Medication Sig Start Date End Date Taking? Authorizing Provider  amiodarone (PACERONE) 200 MG tablet Take 1 tablet (200 mg total) by mouth daily. 01/04/20  Yes Sheikh, Omair Latif, DO  amLODipine (NORVASC) 2.5 MG tablet Take 1 tablet (2.5 mg total) by mouth daily. 01/04/20  Yes Sheikh, Omair Latif, DO  benazepril (LOTENSIN) 10 MG tablet Take 10 mg by mouth daily.   Yes [provider]  diphenhydrAMINE (BENADRYL) 25 mg capsule Take 25 mg by mouth every 6 (six) hours as needed for allergies.   Yes [provider]  fluticasone (FLONASE) 50 MCG/ACT nasal spray Place 1 spray into both nostrils daily. Patient taking differently: Place 2 sprays into both nostrils daily.  01/04/20  Yes Sheikh, Omair Latif, DO  hydrochlorothiazide (HYDRODIURIL) 12.5 MG  tablet Take 12.5 mg by mouth daily.   Yes [provider]  HYDROcodone-acetaminophen (NORCO/VICODIN) 5-325 MG tablet Take 1-2 tablets by mouth every 4 (four) hours as needed for moderate pain. 01/03/20  Yes Sheikh, Omair Latif, DO  loperamide (IMODIUM) 2 MG capsule Take 1 capsule (2 mg total) by mouth as needed for diarrhea or loose stools. 01/03/20  Yes Sheikh, Omair Latif, DO  metFORMIN (GLUMETZA) 1000 MG (MOD) 24 hr tablet Take 500 mg by mouth daily with breakfast.    Yes [provider]  multivitamin (RENA-VIT) TABS tablet Take 1 tablet by mouth at bedtime. 01/03/20  Yes Sheikh, Omair Latif, DO  Nutritional Supplements (FEEDING SUPPLEMENT, NEPRO CARB STEADY,) LIQD Take 237 mLs by mouth 3 (three) times daily between meals. 01/03/20  Yes Sheikh, Omair Latif, DO  ondansetron (ZOFRAN) 4 MG tablet Take 1 tablet (4 mg total) by mouth every 6 (six) hours as needed for nausea. 01/03/20  Yes Sheikh, Omair Latif, DO  pantoprazole (PROTONIX) 40 MG tablet Take 1 tablet (40 mg total) by mouth 2 (two) times daily before a meal. 01/03/20  Yes Sheikh, Omair Latif, DO  rosuvastatin (CRESTOR) 10  MG tablet Take 10 mg by mouth daily.   Yes [provider]  sucralfate (CARAFATE) 1 GM/10ML suspension Take 10 mLs (1 g total) by mouth every 6 (six) hours. 01/03/20  Yes Raiford Noble Latif, DO    Current Facility-Administered Medications  Medication Dose Route Frequency Provider Last Rate Last Admin  . amiodarone (PACERONE) tablet 200 mg  200 mg Oral Daily Derrill Kay A, MD   200 mg at 01/08/20 3151  . amLODipine (NORVASC) tablet 2.5 mg  2.5 mg Oral Daily Derrill Kay A, MD   2.5 mg at 01/08/20 7616  . feeding supplement (ENSURE ENLIVE) (ENSURE ENLIVE) liquid 237 mL  237 mL Oral BID BM Barb Merino, MD   237 mL at 01/08/20 1528  . insulin aspart (novoLOG) injection 0-9 Units  0-9 Units Subcutaneous Q6H Barb Merino, MD   2 Units at 01/09/20 0604  . lactated ringers infusion   Intravenous Continuous  Barb Merino, MD 75 mL/hr at 01/08/20 1528 New Bag at 01/08/20 1528  . loperamide (IMODIUM) capsule 2 mg  2 mg Oral Q6H PRN Barb Merino, MD   2 mg at 01/07/20 1518  . meropenem (MERREM) 500 mg in sodium chloride 0.9 % 100 mL IVPB  500 mg Intravenous Q12H Laren Everts, RPH 200 mL/hr at 01/08/20 2240 500 mg at 01/08/20 2240  . morphine 4 MG/ML injection 4 mg  4 mg Intravenous Q4H PRN Phillips Grout, MD   4 mg at 01/08/20 0542  . nystatin cream (MYCOSTATIN)   Topical BID Barb Merino, MD   Given at 01/08/20 2240  . ondansetron (ZOFRAN) tablet 4 mg  4 mg Oral Q6H PRN Phillips Grout, MD       Or  . ondansetron [facility]) injection 4 mg  4 mg Intravenous Q6H PRN Phillips Grout, MD   4 mg at 01/08/20 1213  . oxyCODONE-acetaminophen (PERCOCET) 7.5-325 MG per tablet 1 tablet  1 tablet Oral Q4H PRN Phillips Grout, MD   1 tablet at 01/09/20 0602  . pantoprazole (PROTONIX) EC tablet 40 mg  40 mg Oral BID AC Phillips Grout, MD   40 mg at 01/08/20 0737  . promethazine (PHENERGAN) injection 12.5 mg  12.5 mg Intravenous Q6H PRN Barb Merino, MD   12.5 mg at 01/08/20 1744  . sucralfate (CARAFATE) 1 GM/10ML suspension 1 g  1 g Oral Q6H Derrill Kay A, MD   1 g at 01/09/20 0602    Allergies as of 01/06/2020 - Review Complete 01/06/2020  Allergen Reaction Noted  . Azithromycin Itching 02/05/2017  . Nickel  01/05/2019  . Sulfa antibiotics  12/01/2011  . Adhesive [tape] Rash 12/01/2011    Family History  Problem Relation Age of Onset  . Stroke Mother        several  . Hyperlipidemia Mother   . Hypertension Mother   . Cancer Father        colon  . Hypertension Father   . Hyperlipidemia Father   . Coronary artery disease Father 4       MIx1, CABG  . Cancer Paternal Aunt        abdominal  . Coronary artery disease Maternal Grandmother   . Diabetes Maternal Grandfather   . Coronary artery disease Maternal Grandfather   . Breast cancer Neg Hx     Social History   Socioeconomic  History  . Marital status: Married    Spouse name: Not on file  . Number of children: Not on  file  . Years of education: Not on file  . Highest education level: Not on file  Occupational History  . Not on file  Tobacco Use  . Smoking status: Never Smoker  . Smokeless tobacco: Never Used  Substance and Sexual Activity  . Alcohol use: Not Currently    Alcohol/week: 1.0 - 2.0 standard drinks    Types: 1 - 2 Glasses of wine per week    Comment: Occasional/1 mixed drink per month  . Drug use: No  . Sexual activity: Yes  Other Topics Concern  . 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 Daily walking about 20 min.    Diet: ok water intake 4 glasses/day, daily fruits/vegetables, red meat 4x/wk, fish 3-4x/wk   Social Determinants of Health   Financial Resource Strain: Low Risk   . Difficulty of Paying Living Expenses: Not hard at all  Food Insecurity: No Food Insecurity  . Worried About Charity fundraiser in the Last Year: Never true  . Ran Out of Food in the Last Year: Never true  Transportation Needs: No Transportation Needs  . Lack of Transportation (Medical): No  . Lack of Transportation (Non-Medical): No  Physical Activity: Inactive  . Days of Exercise per Week: 0 days  . Minutes of Exercise per Session: 0 min  Stress: No Stress Concern Present  . Feeling of Stress : Not at all  Social Connections:   . Frequency of Communication with Friends and Family: Not on file  . Frequency of Social Gatherings with Friends and Family: Not on file  . Attends Religious Services: Not on file  . Active Member of Clubs or Organizations: Not on file  . Attends Archivist Meetings: Not on file  . Marital Status: Not on file  Intimate Partner Violence: Not At Risk   . Fear of Current or Ex-Partner: No  . Emotionally Abused: No  . Physically Abused: No  . Sexually Abused: No    Review of Systems: Positive for: GI: Described in detail in HPI.    Gen: anorexia, fatigue, weakness, malaise,Denies any fever, chills, rigors, night sweats,  involuntary weight loss, and sleep disorder CV: palpitations,orthopnea,Denies chest pain, angina,  syncope,  PND, peripheral edema, and claudication. Resp: dyspnea, Denies cough, sputum, wheezing, coughing up blood. GU : Denies urinary burning, blood in urine, urinary frequency, urinary hesitancy, nocturnal urination, and urinary incontinence. MS: Denies joint pain or swelling.  Denies muscle weakness, cramps, atrophy.  Derm: Denies rash, itching, oral ulcerations, hives, unhealing ulcers.  Psych: Denies depression, anxiety, memory loss, suicidal ideation, hallucinations,  and confusion. Heme: Denies bruising, bleeding, and enlarged lymph nodes. Neuro:  Denies any headaches, dizziness, paresthesias. Endo:  Denies any problems with DM, thyroid, adrenal function.  Physical Exam: Vital signs in last 24 hours: Temp:  [97.8 F (36.6 C)-99.5 F (37.5 C)] 98.5 F (36.9 C) (03/08 0421) Pulse Rate:  [97-100] 100 (03/08 0421) Resp:  [18] 18 (03/08 0421) BP: (130-136)/(56-67) 136/67 (03/08 0421) SpO2:  [91 %-93 %] 93 % (03/08 0421) Weight:  [82.3 kg] 82.3 kg (03/08 0532) Last BM Date: 01/06/20  General:   Alert,  Well-developed, overweight ,pleasant and cooperative in NAD Head:  Normocephalic and atraumatic. Eyes:  Sclera clear, no icterus.  Prominent pallor Ears:  Normal auditory acuity. Nose:  No deformity, discharge,  or lesions. Mouth:  No deformity or lesions.  Oropharynx pink & moist. Neck:  Supple; no masses or thyromegaly. Lungs:  Clear throughout to auscultation.   No wheezes, crackles, or rhonchi. No acute distress. Heart:  Regular rate and rhythm; no murmurs, clicks, rubs,  or gallops. Extremities: Mild  bilateral pitting edema. Neurologic:  Alert and  oriented x4;  grossly normal neurologically. Skin:  Intact without significant lesions or rashes. Psych:  Alert and cooperative. Normal mood and affect. Abdomen: Distended abdomen, normal active bowel sounds, moderate generalized abdominal tenderness, worse in right middle and right lower abdomen, no rigidity, no guarding        Lab Results: Recent Labs    01/07/20 0727 01/08/20 0235 01/09/20 0202  WBC 7.2 7.2 5.7  HGB 7.4* 7.6* 7.9*  HCT 24.0* 24.9* 25.3*  PLT 152 181 197   BMET Recent Labs    01/07/20 0727 01/08/20 0235 01/09/20 0202  NA 140 137 133*  K 4.0 3.9 3.5  CL 108 105 102  CO2 21* 23 22  GLUCOSE 221* 190* 192*  BUN 25* 23 22  CREATININE 1.79* 1.68* 1.50*  CALCIUM 8.3* 8.3* 8.5*   LFT Recent Labs    01/09/20 0202  PROT 5.2*  ALBUMIN 2.2*  AST 15  ALT 12  ALKPHOS 115  BILITOT 0.8   PT/INR No results for input(s): LABPROT, INR in the last 72 hours.  Studies/Results: No results found.  Impression: Acute necrotizing, possibly hemorrhagic pancreatitis Large heterogeneous collection centered upon pancreas replacing much of pancreatic parenchyma(7 cm x 16 cm x 8 cm) Extensive severe intraperitoneal and retroperitoneal phlegmonous changes with a separate central mesentery intraperitoneal rim-enhancing collection 5.6 x 2.5 x 3.1 cm  Gastric wall thickening, recent gastric ulcers noted -currently on pantoprazole 40 mg twice daily and sucralfate 1 g every 6 hours  Lateral displacement and compression of duodenum by mass-effect from pancreatic collection  Normocytic anemia Renal impairment to BUNs 22/creatinine 1.5/GFR 34 Hypoalbuminemia 2.2, low total protein 5.2  Plan: Patient currently on lactated Ringer infusion at 75 mL/h and IV meropenem 500 mg every 12 hours Although she is on full liquid diet, intake is minimal because of ongoing nausea and vomiting. I have placed a consult to IR for placement of  postpyloric feeding, and subsequently feeding to be started. At this point I recommend supportive management, pain control, optimizing nutrition, monitoring hemoglobin and transfusing if needed. Continue PPI and sucralfate with history of recent ulcers noted on EGD from 12/2019. Will likely need repeat imaging in a week. Will discuss with Dr. Rush Landmark if patient would be a candidate for cyst gastrostomy. As renal function continues to improve, hopefully we can get an imaging study with contrast, determine if there is hemorrhagic conversion within the cyst.   LOS: 2 days   Ronnette Juniper, MD  01/09/2020, 9:09 AM

## 2020-01-09 NOTE — Plan of Care (Signed)
  Problem: Education: Goal: Knowledge of Pancreatitis treatment and prevention will improve Outcome: Progressing   

## 2020-01-09 NOTE — TOC Initial Note (Signed)
Transition of Care [facility]) - Initial/Assessment Note    Patient Details  Name: Katherine Oliver MRN: [id number] Date of Birth: [birth year]  Transition of [facility] CM/SW Contact:    Marilu Favre, RN Phone Number: 01/09/2020, 11:43 AM  Clinical Narrative:                 PAtient from home with husband, and [name] HHRN,PT and OT. Will need new orders for resumption of care. Patient receiving cortrak tube for tube feeds. Will continue to follow. Has walker and 3 in1   Expected Discharge Plan: [place]     Patient Goals and CMS Choice Patient states their goals for this hospitalization and ongoing recovery are:: to return to home CMS Medicare.gov Compare Post Acute Care list provided to:: Patient Choice offered to / list presented to : Patient  Expected Discharge Plan and Services Expected Discharge Plan: [place]   Discharge Planning Services: CM Consult Post Acute Care Choice: [place] arrangements for the past 2 months: Single Family Home                 DME Arranged: N/A DME Agency: NA         HH Agency: [name] Date HH Agency Contacted: 01/09/20 Time [place]: 1143 Representative spoke with at [place] Arrangements/Services Living arrangements for the past 2 months: [place] with:: Spouse   Do you feel safe going back to the place where you live?: Yes          Current home services: Home PT, Home RN, Home OT    Activities of Daily Living      Permission Sought/Granted                  Emotional Assessment              Admission diagnosis:  Necrotizing pancreatitis [K85.91] Acute pancreatitis [K85.90] Patient Active Problem List   Diagnosis Date Noted  . Acute pancreatitis 01/07/2020  . Ovarian mass, right 01/06/2020  . Acquired renal cyst of left kidney 01/06/2020  . Gastric stress ulcer 01/06/2020  . Acute  necrotizing pancreatitis   . AF (paroxysmal atrial fibrillation) ([place])   . Anemia   . AKI (acute kidney injury) ([place]) 12/15/2019  . Choledocholithiasis   . Acute gallstone pancreatitis 12/13/2019  . Primary localized osteoarthritis of right knee 01/05/2019  . 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 Hx of adenomatous polyp of colon 12/15/2017  . Vulvar dermatitis 12/07/2017  . Stress due to illness of family member 08/18/2017  . Left sided abdominal pain 02/05/2017  . Right hip pain 08/07/2016  . CKD stage 3 due to type 2 diabetes mellitus ([place]) 04/04/2016  . Trigger ring finger of right hand 04/04/2016  . Pulmonary nodules 01/03/2016  . Advanced care planning/counseling discussion 07/05/2015  . Obesity, Class I, BMI 30-34.9 07/05/2015  . 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 Osteopenia 06/03/2013  . Medicare annual wellness visit, subsequent 05/31/2012  . Rotator cuff tendonitis, right 03/01/2012  . Polycythemia 12/02/2011  . Diabetes mellitus type 2, uncontrolled, with complications ([place])   . HTN (hypertension)   . Dyslipidemia associated with type 2 diabetes mellitus ([place])   . History of recurrent UTIs   . Allergic rhinitis   . GERD (gastroesophageal reflux disease)   . Rosacea   . NAFLD (nonalcoholic fatty liver disease) 06/03/2010  PCP:  [name], MD Pharmacy:   [place], [address] [place] [address] Phone: [phone] Fax: [phone]  [id number] [place], [address] [address] Phone: [phone] Fax: [phone]     Social Determinants of Health (SDOH) Interventions    Readmission Risk Interventions No flowsheet data found.

## 2020-01-10 ENCOUNTER — Other Ambulatory Visit: Payer: Self-pay | Admitting: *Deleted

## 2020-01-10 DIAGNOSIS — N179 Acute kidney failure, unspecified: Secondary | ICD-10-CM

## 2020-01-10 DIAGNOSIS — I480 Paroxysmal atrial fibrillation: Secondary | ICD-10-CM

## 2020-01-10 DIAGNOSIS — I10 Essential (primary) hypertension: Secondary | ICD-10-CM

## 2020-01-10 LAB — COMPREHENSIVE METABOLIC PANEL
ALT: 13 U/L (ref 0–44)
AST: 15 U/L (ref 15–41)
Albumin: 2.2 g/dL — ABNORMAL LOW (ref 3.5–5.0)
Alkaline Phosphatase: 116 U/L (ref 38–126)
Anion gap: 11 (ref 5–15)
BUN: 21 mg/dL (ref 8–23)
CO2: 21 mmol/L — ABNORMAL LOW (ref 22–32)
Calcium: 8.6 mg/dL — ABNORMAL LOW (ref 8.9–10.3)
Chloride: 102 mmol/L (ref 98–111)
Creatinine, Ser: 1.46 mg/dL — ABNORMAL HIGH (ref 0.44–1.00)
GFR calc Af Amer: 40 mL/min — ABNORMAL LOW (ref >60)
GFR calc non Af Amer: 35 mL/min — ABNORMAL LOW (ref >60)
Glucose, Bld: 329 mg/dL — ABNORMAL HIGH (ref 70–99)
Potassium: 3.9 mmol/L (ref 3.5–5.1)
Sodium: 134 mmol/L — ABNORMAL LOW (ref 135–145)
Total Bilirubin: 0.3 mg/dL (ref 0.3–1.2)
Total Protein: 5.5 g/dL — ABNORMAL LOW (ref 6.5–8.1)

## 2020-01-10 LAB — CBC WITH DIFFERENTIAL/PLATELET
Abs Immature Granulocytes: 0.23 10*3/uL — ABNORMAL HIGH (ref 0.00–0.07)
Basophils Absolute: 0 10*3/uL (ref 0.0–0.1)
Basophils Relative: 0 %
Eosinophils Absolute: 0 10*3/uL (ref 0.0–0.5)
Eosinophils Relative: 0 %
HCT: 25 % — ABNORMAL LOW (ref 36.0–46.0)
Hemoglobin: 7.9 g/dL — ABNORMAL LOW (ref 12.0–15.0)
Immature Granulocytes: 3 %
Lymphocytes Relative: 9 %
Lymphs Abs: 0.8 10*3/uL (ref 0.7–4.0)
MCH: 27.1 pg (ref 26.0–34.0)
MCHC: 31.6 g/dL (ref 30.0–36.0)
MCV: 85.6 fL (ref 80.0–100.0)
Monocytes Absolute: 1.7 10*3/uL — ABNORMAL HIGH (ref 0.1–1.0)
Monocytes Relative: 18 %
Neutro Abs: 6.6 10*3/uL (ref 1.7–7.7)
Neutrophils Relative %: 70 %
Platelets: 246 10*3/uL (ref 150–400)
RBC: 2.92 MIL/uL — ABNORMAL LOW (ref 3.87–5.11)
RDW: 15.3 % (ref 11.5–15.5)
WBC: 9.4 10*3/uL (ref 4.0–10.5)
nRBC: 0 % (ref 0.0–0.2)

## 2020-01-10 LAB — GLUCOSE, CAPILLARY
Glucose-Capillary: 325 mg/dL — ABNORMAL HIGH (ref 70–99)
Glucose-Capillary: 284 mg/dL — ABNORMAL HIGH (ref 70–99)
Glucose-Capillary: 291 mg/dL — ABNORMAL HIGH (ref 70–99)
Glucose-Capillary: 308 mg/dL — ABNORMAL HIGH (ref 70–99)
Glucose-Capillary: 299 mg/dL — ABNORMAL HIGH (ref 70–99)

## 2020-01-10 LAB — MAGNESIUM: Magnesium: 1.7 mg/dL (ref 1.7–2.4)

## 2020-01-10 LAB — PHOSPHORUS: Phosphorus: 2.7 mg/dL (ref 2.5–4.6)

## 2020-01-10 MED ORDER — ONDANSETRON HCL 4 MG/2ML IJ SOLN: 4.0000 mg | Freq: Four times a day (QID) | INTRAMUSCULAR | Status: DC | PRN

## 2020-01-10 MED ORDER — NALOXONE HCL 0.4 MG/ML IJ SOLN: 0.4000 mg | INTRAMUSCULAR | Status: DC | PRN

## 2020-01-10 MED ORDER — INSULIN GLARGINE 100 UNIT/ML ~~LOC~~ SOLN
10.0000 [IU] | Freq: Every day | SUBCUTANEOUS | Status: DC
  Administered 2020-01-10 – 2020-01-15 (×6): 10 [IU] via SUBCUTANEOUS
  Filled 2020-01-10 (×7): qty 0.1

## 2020-01-10 MED ORDER — SODIUM CHLORIDE 0.9 % IV SOLN
1.0000 g | Freq: Two times a day (BID) | INTRAVENOUS | Status: AC
  Administered 2020-01-10 – 2020-01-13 (×7): 1 g via INTRAVENOUS
  Filled 2020-01-10 (×9): qty 1

## 2020-01-10 MED ORDER — SODIUM CHLORIDE 0.9% FLUSH: 9.0000 mL | INTRAVENOUS | Status: DC | PRN

## 2020-01-10 MED ORDER — INSULIN ASPART 100 UNIT/ML ~~LOC~~ SOLN
0.0000 [IU] | SUBCUTANEOUS | Status: DC
  Administered 2020-01-10: 4 [IU] via SUBCUTANEOUS
  Administered 2020-01-10: 8 [IU] via SUBCUTANEOUS
  Administered 2020-01-11: 5 [IU] via SUBCUTANEOUS
  Administered 2020-01-11: 22:00:00 3 [IU] via SUBCUTANEOUS
  Administered 2020-01-11 (×2): 11 [IU] via SUBCUTANEOUS
  Administered 2020-01-11: 8 [IU] via SUBCUTANEOUS
  Administered 2020-01-11: 5 [IU] via SUBCUTANEOUS
  Administered 2020-01-12: 2 [IU] via SUBCUTANEOUS
  Administered 2020-01-12 – 2020-01-13 (×6): 3 [IU] via SUBCUTANEOUS
  Administered 2020-01-13: 5 [IU] via SUBCUTANEOUS
  Administered 2020-01-13: 3 [IU] via SUBCUTANEOUS
  Administered 2020-01-13 (×2): 5 [IU] via SUBCUTANEOUS
  Administered 2020-01-13: 3 [IU] via SUBCUTANEOUS
  Administered 2020-01-14: 5 [IU] via SUBCUTANEOUS
  Administered 2020-01-14 (×2): 8 [IU] via SUBCUTANEOUS
  Administered 2020-01-14: 3 [IU] via SUBCUTANEOUS
  Administered 2020-01-14: 5 [IU] via SUBCUTANEOUS
  Administered 2020-01-14: 8 [IU] via SUBCUTANEOUS
  Administered 2020-01-14 – 2020-01-15 (×2): 5 [IU] via SUBCUTANEOUS
  Administered 2020-01-15: 8 [IU] via SUBCUTANEOUS
  Administered 2020-01-15: 5 [IU] via SUBCUTANEOUS

## 2020-01-10 MED ORDER — PANCRELIPASE (LIP-PROT-AMYL) 36000-114000 UNITS PO CPEP
36000.0000 [IU] | ORAL_CAPSULE | Freq: Three times a day (TID) | ORAL | Status: DC
  Administered 2020-01-10 – 2020-01-11 (×4): 36000 [IU] via ORAL
  Filled 2020-01-10 (×2): qty 1
  Filled 2020-01-10: qty 3
  Filled 2020-01-10: qty 1

## 2020-01-10 MED ORDER — DIPHENHYDRAMINE HCL 50 MG/ML IJ SOLN: 12.5000 mg | Freq: Four times a day (QID) | INTRAMUSCULAR | Status: DC | PRN

## 2020-01-10 MED ORDER — DIPHENHYDRAMINE HCL 12.5 MG/5ML PO ELIX: 12.5000 mg | ORAL_SOLUTION | Freq: Four times a day (QID) | ORAL | Status: DC | PRN

## 2020-01-10 MED ORDER — HYDROMORPHONE 1 MG/ML IV SOLN
INTRAVENOUS | Status: DC
  Administered 2020-01-10: 1.2 mg via INTRAVENOUS
  Administered 2020-01-10: 0.4 mg via INTRAVENOUS
  Administered 2020-01-10: 30 mg via INTRAVENOUS
  Administered 2020-01-11: 0.6 mg via INTRAVENOUS
  Administered 2020-01-11: 2 mg via INTRAVENOUS
  Filled 2020-01-10: qty 30

## 2020-01-10 NOTE — Progress Notes (Signed)
PT Cancellation Note  Patient Details Name: Katherine Oliver MRN: [id number] DOB: 05/20/1946   Cancelled Treatment:    Reason Eval/Treat Not Completed: Fatigue/lethargy limiting ability to participate(pt transferred from 6N to 3w with PCA and reports fatigue and unable to tolerate any activity today)   Katherine Oliver B [name] 01/10/2020, 12:28 PM  [name], PT Acute Rehabilitation Services Pager: [phone] Office: [phone]

## 2020-01-10 NOTE — Consult Note (Signed)
   [facility] CM Inpatient Consult   01/10/2020  Katherine Oliver [facility] [birth year] [id number]  Follow up:  Patient transitioned from [place] to [facility], noted [facility] staff with EMMI alerts.  Natividad Brood, RN BSN [facility] Liaison  [phone] business mobile phone Toll free office [phone]  Fax number: [phone] [name]

## 2020-01-10 NOTE — Progress Notes (Signed)
Subjective: Patient had a nasojejunal tube placed, has started nasojejunal feeds and complains of having 1 episode of loose bowel movement since. She continues to have generalized abdominal pain, 8-9 out of 10 intensity, denies nausea or vomiting, tolerating very little oral diet.  Objective: Vital signs in last 24 hours: Temp:  [97.7 F (36.5 C)-98 F (36.7 C)] 97.7 F (36.5 C) (03/09 0412) Pulse Rate:  [100-116] 110 (03/09 0412) Resp:  [19-24] 20 (03/09 0412) BP: (118-156)/(67-72) 118/72 (03/09 0412) SpO2:  [89 %-94 %] 94 % (03/09 0412) Weight change:  Last BM Date: 01/10/20  PE: Lying on bed, appears to be in discomfort from pain, slightly short of breath GENERAL: Mild pallor without icterus ABDOMEN: Distended, diffusely tender, normal active bowel sounds EXTREMITIES: No deformity  Lab Results: Results for orders placed or performed during the hospital encounter of 01/07/20 (from the past 48 hour(s))  Glucose, capillary     Status: Abnormal   Collection Time: 01/08/20 11:35 AM  Result Value Ref Range   Glucose-Capillary 163 (H) 70 - 99 mg/dL    Comment: Glucose reference range applies only to samples taken after fasting for at least 8 hours.  Glucose, capillary     Status: Abnormal   Collection Time: 01/08/20 10:19 PM  Result Value Ref Range   Glucose-Capillary 216 (H) 70 - 99 mg/dL    Comment: Glucose reference range applies only to samples taken after fasting for at least 8 hours.  Glucose, capillary     Status: Abnormal   Collection Time: 01/08/20 11:59 PM  Result Value Ref Range   Glucose-Capillary 205 (H) 70 - 99 mg/dL    Comment: Glucose reference range applies only to samples taken after fasting for at least 8 hours.  Comprehensive metabolic panel     Status: Abnormal   Collection Time: 01/09/20  2:02 AM  Result Value Ref Range   Sodium 133 (L) 135 - 145 mmol/L   Potassium 3.5 3.5 - 5.1 mmol/L   Chloride 102 98 - 111 mmol/L   CO2 22 22 - 32 mmol/L   Glucose,  Bld 192 (H) 70 - 99 mg/dL    Comment: Glucose reference range applies only to samples taken after fasting for at least 8 hours.   BUN 22 8 - 23 mg/dL   Creatinine, Ser 1.50 (H) 0.44 - 1.00 mg/dL   Calcium 8.5 (L) 8.9 - 10.3 mg/dL   Total Protein 5.2 (L) 6.5 - 8.1 g/dL   Albumin 2.2 (L) 3.5 - 5.0 g/dL   AST 15 15 - 41 U/L   ALT 12 0 - 44 U/L   Alkaline Phosphatase 115 38 - 126 U/L   Total Bilirubin 0.8 0.3 - 1.2 mg/dL   GFR calc non Af Amer 34 (L) >60 mL/min   GFR calc Af Amer 39 (L) >60 mL/min   Anion gap 9 5 - 15    Comment: Performed at [facility] [address], [place], [place] [address]  CBC with Differential/Platelet     Status: Abnormal   Collection Time: 01/09/20  2:02 AM  Result Value Ref Range   WBC 5.7 4.0 - 10.5 K/uL   RBC 2.86 (L) 3.87 - 5.11 MIL/uL   Hemoglobin 7.9 (L) 12.0 - 15.0 g/dL   HCT 25.3 (L) 36.0 - 46.0 %   MCV 88.5 80.0 - 100.0 fL   MCH 27.6 26.0 - 34.0 pg   MCHC 31.2 30.0 - 36.0 g/dL   RDW 15.3 11.5 - 15.5 %  Platelets 197 150 - 400 K/uL   nRBC 0.0 0.0 - 0.2 %   Neutrophils Relative % 69 %   Neutro Abs 4.0 1.7 - 7.7 K/uL   Lymphocytes Relative 13 %   Lymphs Abs 0.7 0.7 - 4.0 K/uL   Monocytes Relative 16 %   Monocytes Absolute 0.9 0.1 - 1.0 K/uL   Eosinophils Relative 0 %   Eosinophils Absolute 0.0 0.0 - 0.5 K/uL   Basophils Relative 1 %   Basophils Absolute 0.0 0.0 - 0.1 K/uL   Immature Granulocytes 1 %   Abs Immature Granulocytes 0.05 0.00 - 0.07 K/uL    Comment: Performed at [facility] [address] [place], [place] [address]  Magnesium     Status: None   Collection Time: 01/09/20  2:02 AM  Result Value Ref Range   Magnesium 1.9 1.7 - 2.4 mg/dL    Comment: Performed at [facility], [place] [address], [place], [place] [address]  Phosphorus     Status: Abnormal   Collection Time: 01/09/20  2:02 AM  Result Value Ref Range   Phosphorus 2.3 (L) 2.5 - 4.6 mg/dL    Comment: Performed at [facility] [address], [place], [place] [address]  Glucose, capillary     Status: Abnormal   Collection Time: 01/09/20  4:19 AM  Result Value Ref Range   Glucose-Capillary 170 (H) 70 - 99 mg/dL    Comment: Glucose reference range applies only to samples taken after fasting for at least 8 hours.  Glucose, capillary     Status: Abnormal   Collection Time: 01/09/20  5:50 AM  Result Value Ref Range   Glucose-Capillary 171 (H) 70 - 99 mg/dL    Comment: Glucose reference range applies only to samples taken after fasting for at least 8 hours.  Glucose, capillary     Status: Abnormal   Collection Time: 01/09/20 12:22 PM  Result Value Ref Range   Glucose-Capillary 154 (H) 70 - 99 mg/dL    Comment: Glucose reference range applies only to samples taken after fasting for at least 8 hours.  Glucose, capillary     Status: Abnormal   Collection Time: 01/09/20  5:27 PM  Result Value Ref Range   Glucose-Capillary 222 (H) 70 - 99 mg/dL    Comment: Glucose reference range applies only to samples taken after fasting for at least 8 hours.  Glucose, capillary     Status: Abnormal   Collection Time: 01/09/20  8:48 PM  Result Value Ref Range   Glucose-Capillary 270 (H) 70 - 99 mg/dL    Comment: Glucose reference range applies only to samples taken after fasting for at least 8 hours.  Glucose, capillary     Status: Abnormal   Collection Time: 01/09/20 11:52 PM  Result Value Ref Range   Glucose-Capillary 344 (H) 70 - 99 mg/dL    Comment: Glucose reference range applies only to samples taken after fasting for at least 8 hours.  Comprehensive metabolic panel     Status: Abnormal   Collection Time: 01/10/20  2:34 AM  Result Value Ref Range   Sodium 134 (L) 135 - 145 mmol/L   Potassium 3.9 3.5 - 5.1 mmol/L   Chloride 102 98 - 111 mmol/L   CO2 21 (L) 22 - 32 mmol/L   Glucose, Bld 329 (H) 70 - 99 mg/dL    Comment: Glucose reference range applies only to samples taken after fasting for at least 8  hours.   BUN 21 8 - 23 mg/dL    Creatinine, Ser 1.46 (H) 0.44 - 1.00 mg/dL   Calcium 8.6 (L) 8.9 - 10.3 mg/dL   Total Protein 5.5 (L) 6.5 - 8.1 g/dL   Albumin 2.2 (L) 3.5 - 5.0 g/dL   AST 15 15 - 41 U/L   ALT 13 0 - 44 U/L   Alkaline Phosphatase 116 38 - 126 U/L   Total Bilirubin 0.3 0.3 - 1.2 mg/dL   GFR calc non Af Amer 35 (L) >60 mL/min   GFR calc Af Amer 40 (L) >60 mL/min   Anion gap 11 5 - 15    Comment: Performed at [facility] [address], [place], [place] [address]  CBC with Differential/Platelet     Status: Abnormal   Collection Time: 01/10/20  2:34 AM  Result Value Ref Range   WBC 9.4 4.0 - 10.5 K/uL   RBC 2.92 (L) 3.87 - 5.11 MIL/uL   Hemoglobin 7.9 (L) 12.0 - 15.0 g/dL   HCT 25.0 (L) 36.0 - 46.0 %   MCV 85.6 80.0 - 100.0 fL   MCH 27.1 26.0 - 34.0 pg   MCHC 31.6 30.0 - 36.0 g/dL   RDW 15.3 11.5 - 15.5 %   Platelets 246 150 - 400 K/uL   nRBC 0.0 0.0 - 0.2 %   Neutrophils Relative % 70 %   Neutro Abs 6.6 1.7 - 7.7 K/uL   Lymphocytes Relative 9 %   Lymphs Abs 0.8 0.7 - 4.0 K/uL   Monocytes Relative 18 %   Monocytes Absolute 1.7 (H) 0.1 - 1.0 K/uL   Eosinophils Relative 0 %   Eosinophils Absolute 0.0 0.0 - 0.5 K/uL   Basophils Relative 0 %   Basophils Absolute 0.0 0.0 - 0.1 K/uL   Immature Granulocytes 3 %   Abs Immature Granulocytes 0.23 (H) 0.00 - 0.07 K/uL    Comment: Performed at [facility] [address], [place], [place] [address]  Magnesium     Status: None   Collection Time: 01/10/20  2:34 AM  Result Value Ref Range   Magnesium 1.7 1.7 - 2.4 mg/dL    Comment: Performed at [facility] [address], [place], [place] [address]  Phosphorus     Status: None   Collection Time: 01/10/20  2:34 AM  Result Value Ref Range   Phosphorus 2.7 2.5 - 4.6 mg/dL    Comment: Performed at [facility] [address] [place], [place] [address]  Glucose, capillary     Status: Abnormal   Collection Time: 01/10/20  4:09 AM  Result Value Ref Range   Glucose-Capillary  325 (H) 70 - 99 mg/dL    Comment: Glucose reference range applies only to samples taken after fasting for at least 8 hours.    Studies/Results: DG Abd Portable 1V  Result Date: 01/09/2020 CLINICAL DATA:  Feeding tube placement. EXAM: PORTABLE ABDOMEN - 1 VIEW COMPARISON:  12/16/2019 FINDINGS: Enteric feeding tube tip projects at the level of the ligament of Treitz, well positioned. There is a biliary stent which extends from the second portion of the duodenum, based on the enteric tube placement, to just inferior and medially to cholecystectomy surgical clips. There is normal bowel gas pattern. IMPRESSION: 1. Well-positioned enteric tube Electronically Signed   By: Lajean Manes M.D.   On: 01/09/2020 12:25    Medications: I have reviewed the patient's current medications.  Assessment: Acute necrotic  pancreatitis with extensive intraperitoneal and retroperitoneal changes and rim enhancing lesions Bilateral pleural effusions, right greater than left Slight improvement in renal function, BUN 21/creatinine 1.46/GFR 35, mild acidosis, hyperglycemia No leukocytosis, normocytic anemia, normal platelet Low total protein of 5.5 and albumin of 2.2  Plan: Patient currently on clear liquid diet, has been started on nasojejunal feedings with vital 1.2 at 60 mL/h, while being continued on Ensure twice a day-all important to improve nutritional status.  Ideally patient needs a CAT scan with IV contrast to better define pancreatic fluid collectionIs and rule out hemorrhagic conversion, however due to patient's impaired renal function, IV contrast cannot be given currently.  Also to be noted patient required hemodialysis temporarily during last admission last month.  For now recommend continuing supportive management with lactated Ringer's at 50 cc an hour, nasojejunal feedings, pain control.  Patient may benefit from PCA pump, as I do not feel her pain is adequately controlled on Percocet 1 tablet every 4  hours and morphine 4 mg every 4 hours as needed. Patient was on IV meropenem seems to have been discontinued, currently does not have any fever or leukocytosis.  Depending upon CAT scan with IV contrast, patient will likely need a cyst gastrostomy. As her hemoglobin has stayed stable at 7.4/7.6/7.9/7.9, it seems that hemorrhagic conversion of the cyst is unlikely or stable.  However if there is a precipitous drop in hemoglobin, patient will need a CT angio of the abdomen and pelvis to rule out pseudoaneurysms or hemorrhagic conversion of the cyst. Guarded prognosis   Ronnette Juniper, MD 01/10/2020, 8:05 AM

## 2020-01-10 NOTE — Progress Notes (Signed)
Pharmacy Antibiotic Note  Katherine Oliver is a 74 y.o. female admitted on 01/07/2020 with Intra-Abdominal pain.  Patient with severe necrotizing pancreatitis. She was receiving Merrem since 3/5 which was discontinued per MD on 01/09/20.  Today 01/10/20 pharmacy consulted to restart Merrem for intra-abd infection.    Afebrile. WBC wnl 7.2>5.7 >9.4.   SCr slight improvement, is 1.46 today, CrCl 32 ml/min. Will restart Meropenem at  1 g IV q12hr.    Plan: Meropenem 1 g IV q12 hour Monitor clinical status, renal function and culture results daily.    Height: 5\' 1"  (154.9 cm) Weight: 181 lb 7 oz (82.3 kg) IBW/kg (Calculated) : 47.8  Temp (24hrs), Avg:97.8 F (36.6 C), Min:97.7 F (36.5 C), Max:98 F (36.7 C)  Recent Labs  Lab 01/06/20 1339 01/07/20 0727 01/08/20 0235 01/09/20 0202 01/10/20 0234  WBC 16.8* 7.2 7.2 5.7 9.4  CREATININE 1.91* 1.79* 1.68* 1.50* 1.46*    Estimated Creatinine Clearance: 32.4 mL/min (A) (by C-G formula based on SCr of 1.46 mg/dL (H)).    Allergies  Allergen Reactions  . Azithromycin Itching    Okay if takes benadryl along with it  . Nickel     Reaction to cheap earrings  . Sulfa Antibiotics     Itching.   [name] [Acetic Acid]     Nausea.   . Adhesive [Tape] Rash    Paper tape - blisters    Antimicrobials this admission: Cefepime 3/5 x 1 Metronidazole 3/5 x 1 Meropenem 3/5 >>3/8; restart 3/9>>  Dose adjustments this admission:  Microbiology results: 3/5 Covid: neg, influenza a/b neg   Thank you for allowing pharmacy to be a part of this patient's care.  Nicole Cella, RPh Clinical Pharmacist [phone] Please check AMION for all [facility] phone numbers After 10:00 PM, call [place] [phone] 01/10/2020 9:48 AM

## 2020-01-10 NOTE — Progress Notes (Signed)
PROGRESS NOTE    Katherine Oliver  IBB:[id number] DOB: February 01, 1946 DOA: 01/07/2020 PCP: [name], MD    Brief Narrative:  74 year old female with history of diabetes, prior cholecystectomy in 2003, recent extensive hospitalization due to choledocholithiasis and necrotic pancreatitis, renal failure requiring hemodialysis who was discharged 2 days ago with planned outpatient follow-up.  She was diagnosed with necrotizing pancreatitis, underwent ERCP and stenting, had protracted hospitalization.  Continue to have poor appetite and abdominal pain so presented back to the [facility] hospital. In the emergency room, hemodynamically stable, repeat imaging studies showed increasing necrotic collection in the pancreas that replaced entire pancreas.  Also showed intraperitoneal and retroperitoneal phlegmonous changes. Pt admitted for further management.    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 Therisa Doyne on 01/10/2020 plan for imaging with contrast in 2-3 days to allow for continous renal recovery for possible cyst gastrostomy, however if sig drop in hemoglobin, will need stat CTA abd/pelvis to rule out pseudoaneurysms or hemorrhagic conversion of the cyst Discussed with surgery, currently no peritoneal complications, suggested no intervention. Discussed with interventional radiology, suggested no external drainage. Continue tube feeds, gentle hydration, clear liquid diet Started PCA  pump for pain control, bowel regimen (noted to be having loose stools for now) Started IV meropenem empirically Transferred patient over to progressive floor for close observation  AKI Continues to improve Required dialysis briefly last admission Daily CMP, avoid nephrotoxic Patient does not want to ever get back on dialysis  Normocytic anemia/chronic disease Hemoglobin continues to be around baseline Type and screen, anemia panel Monitor closely for any significant drop in hemoglobin, concern for hemorrhagic conversion of the cyst Daily CBC  Type 2 diabetes mellitus Last A1c 6.4 on 12/14/2019 Noted hyperglycemia Start Lantus, SSI (adjust accordingly), Accu-Cheks, hypoglycemic protocol Hold home Metformin  Hypertension Stable  Continue amlodipine  Paroxysmal A. Fib Currently rate controlled on sinus rhythm Continue amiodarone Not on anticoagulation  GERD Continue PPI  Obesity Lifestyle modification advised  Goals of care Patient with very guarded prognosis Palliative consult     DVT prophylaxis: SCDs Code Status: Full code Family Communication: Discussed extensively with patient Disposition Plan: Patient is from home. Barriers to discharge, pending further work-up with GI, continuous pain requiring PCA, needing IV fluids   Consultants:   Interventional radiology  GI  Procedures:   None  Antimicrobials:   Meropenem   Subjective: Patient seen and examined at bedside.  Appears lethargic, very deconditioned.  Still with significant generalized abdominal tenderness, poor appetite, nausea/vomiting.  Denies any chest pain, shortness of breath, fever/chills.  Objective: Vitals:   01/09/20 2103 01/10/20 0412 01/10/20 1152 01/10/20 1156  BP:  118/72  (!) 141/63  Pulse: 100 (!) 110  (!) 104  Resp:  20 18 20   Temp:  97.7 F (36.5 C)  97.8 F (36.6 C)  TempSrc:  Oral  Oral  SpO2: 94% 94% 95% 95%  Weight:      Height:        Intake/Output Summary  (Last 24 hours) at 01/10/2020 1348 Last  data filed at 01/10/2020 3235 Gross per 24 hour  Intake 100 ml  Output --  Net 100 ml   Filed Weights   01/06/20 2336 01/09/20 0532  Weight: 80.2 kg 82.3 kg    Examination:  General: NAD, lethargic, deconditioned, chronically ill-appearing, pale appearing  Cardiovascular: S1, S2 present  Respiratory:  Diminished breath sounds bilaterally  Abdomen: Soft, ++ generalized tenderness, ++distended, bowel sounds present  Musculoskeletal: +1 bilateral pedal edema noted  Skin: Normal  Psychiatry: Fair mood    Data Reviewed: I have personally reviewed following labs and imaging studies  CBC: Recent Labs  Lab 01/06/20 1339 01/07/20 0727 01/08/20 0235 01/09/20 0202 01/10/20 0234  WBC 16.8* 7.2 7.2 5.7 9.4  NEUTROABS  --   --  5.5 4.0 6.6  HGB 8.9* 7.4* 7.6* 7.9* 7.9*  HCT 28.7* 24.0* 24.9* 25.3* 25.0*  MCV 88.0 88.2 89.9 88.5 85.6  PLT 180 152 181 197 573   Basic Metabolic Panel: Recent Labs  Lab 01/06/20 1339 01/07/20 0727 01/08/20 0235 01/09/20 0202 01/10/20 0234  NA 133* 140 137 133* 134*  K 3.7 4.0 3.9 3.5 3.9  CL 98 108 105 102 102  CO2 21* 21* 23 22 21*  GLUCOSE 252* 221* 190* 192* 329*  BUN 31* 25* 23 22 21   CREATININE 1.91* 1.79* 1.68* 1.50* 1.46*  CALCIUM 8.9 8.3* 8.3* 8.5* 8.6*  MG  --   --  1.1* 1.9 1.7  PHOS  --   --  3.2 2.3* 2.7   GFR: Estimated Creatinine Clearance: 32.4 mL/min (A) (by C-G formula based on SCr of 1.46 mg/dL (H)). Liver Function Tests: Recent Labs  Lab 01/04/20 1734 01/06/20 1339 01/08/20 0235 01/09/20 0202 01/10/20 0234  AST 23 18 12* 15 15  ALT 13 12 10 12 13   ALKPHOS 112 135* 104 115 116  BILITOT 0.7 1.1 0.7 0.8 0.3  PROT 5.8* 6.9 5.0* 5.2* 5.5*  ALBUMIN 2.8* 3.3* 2.1* 2.2* 2.2*   Recent Labs  Lab 01/04/20 1734 01/06/20 1339  LIPASE 35 27   No results for input(s): AMMONIA in the last 168 hours. Coagulation Profile: No results for input(s): INR, PROTIME in the last 168  hours. Cardiac Enzymes: No results for input(s): CKTOTAL, CKMB, CKMBINDEX, TROPONINI in the last 168 hours. BNP (last 3 results) No results for input(s): PROBNP in the last 8760 hours. HbA1C: No results for input(s): HGBA1C in the last 72 hours. CBG: Recent Labs  Lab 01/09/20 2048 01/09/20 2352 01/10/20 0409 01/10/20 0838 01/10/20 1155  GLUCAP 270* 344* 325* 284* 291*   Lipid Profile: No results for input(s): CHOL, HDL, LDLCALC, TRIG, CHOLHDL, LDLDIRECT in the last 72 hours. Thyroid Function Tests: No results for input(s): TSH, T4TOTAL, FREET4, T3FREE, THYROIDAB in the last 72 hours. Anemia Panel: No results for input(s): VITAMINB12, FOLATE, FERRITIN, TIBC, IRON, RETICCTPCT in the last 72 hours. Sepsis Labs: No results for input(s): PROCALCITON, LATICACIDVEN in the last 168 hours.  Recent Results (from the past 240 hour(s))  Respiratory Panel by RT PCR (Flu A&B, Covid) - Nasopharyngeal Swab     Status: None   Collection Time: 01/06/20  5:16 PM   Specimen: Nasopharyngeal Swab  Result Value Ref Range Status   SARS Coronavirus 2 by RT PCR NEGATIVE NEGATIVE Final    Comment: (NOTE) SARS-CoV-2 target nucleic acids are NOT DETECTED. The SARS-CoV-2 RNA is generally detectable in upper respiratoy specimens during the acute phase of infection. The lowest concentration of SARS-CoV-2 viral copies this assay can detect is  131 copies/mL. A negative result does not preclude SARS-Cov-2 infection and should not be used as the sole basis for treatment or other patient management decisions. A negative result may occur with  improper specimen collection/handling, submission of specimen other than nasopharyngeal swab, presence of viral mutation(s) within the areas targeted by this assay, and inadequate number of viral copies (<131 copies/mL). A negative result must be combined with clinical observations, patient history, and epidemiological information. The expected result is Negative. Fact  Sheet for Patients:  PinkCheek.be Fact Sheet for Healthcare Providers:  GravelBags.it This test is not yet ap proved or cleared by the [place] FDA and  has been authorized for detection and/or diagnosis of SARS-CoV-2 by FDA under an Emergency Use Authorization (EUA). This EUA will remain  in effect (meaning this test can be used) for the duration of the COVID-19 declaration under Section 564(b)(1) of the Act, 21 U.S.C. section 360bbb-3(b)(1), unless the authorization is terminated or revoked sooner.    Influenza A by PCR NEGATIVE NEGATIVE Final   Influenza B by PCR NEGATIVE NEGATIVE Final    Comment: (NOTE) The Xpert Xpress SARS-CoV-2/FLU/RSV assay is intended as an aid in  the diagnosis of influenza from Nasopharyngeal swab specimens and  should not be used as a sole basis for treatment. Nasal washings and  aspirates are unacceptable for Xpert Xpress SARS-CoV-2/FLU/RSV  testing. Fact Sheet for Patients: PinkCheek.be Fact Sheet for Healthcare Providers: GravelBags.it This test is not yet approved or cleared by the [place] FDA and  has been authorized for detection and/or diagnosis of SARS-CoV-2 by  FDA under an Emergency Use Authorization (EUA). This EUA will remain  in effect (meaning this test can be used) for the duration of the  Covid-19 declaration under Section 564(b)(1) of the Act, 21  U.S.C. section 360bbb-3(b)(1), unless the authorization is  terminated or revoked. Performed at [facility], [address], [place], [place] [address]          Radiology Studies: DG Abd Portable 1V  Result Date: 01/09/2020 CLINICAL DATA:  Feeding tube placement. EXAM: PORTABLE ABDOMEN - 1 VIEW COMPARISON:  12/16/2019 FINDINGS: Enteric feeding tube tip projects at the level of the ligament of Treitz, well positioned. There is a biliary stent which  extends from the second portion of the duodenum, based on the enteric tube placement, to just inferior and medially to cholecystectomy surgical clips. There is normal bowel gas pattern. IMPRESSION: 1. Well-positioned enteric tube Electronically Signed   By: Lajean Manes M.D.   On: 01/09/2020 12:25        Scheduled Meds: . amiodarone  200 mg Oral Daily  . amLODipine  2.5 mg Oral Daily  . feeding supplement (ENSURE ENLIVE)  237 mL Oral BID BM  . feeding supplement (VITAL AF 1.2 CAL)  1,000 mL Per Tube Q24H  . free water  150 mL Per Tube Q6H  . HYDROmorphone   Intravenous Q4H  . insulin aspart  0-9 Units Subcutaneous Q4H  . insulin glargine  10 Units Subcutaneous Daily  . lipase/protease/amylase  36,000 Units Oral TID  . nystatin cream   Topical BID  . pantoprazole  40 mg Oral BID AC  . sucralfate  1 g Oral Q6H   Continuous Infusions: . lactated ringers 50 mL/hr at 01/09/20 1402  . meropenem (MERREM) IV 1 g (01/10/20 1206)     LOS: 3 days        Alma Friendly, MD [facility]

## 2020-01-10 NOTE — Patient Outreach (Signed)
[place] [facility]) Care Management [facility] CM EMMI Red-Alert notification # 2- documentation only  01/10/2020  Aubrina Nieman [facility] 05-15-1946 [id number]  EMMI Red Alert notification/ General Discharge EMMI Call date/ day #: Friday January 06, 2020; day # 1 Red Alert reason(s): "questions about discharge papers;" no scheduled follow up" "not taking meds;" "not healing;" have questions/ problems"  EMMI Red Alert notification # 2/ General Discharge EMMI Call date/ day #: Monday January 09, 2020; day # 4 Red Alert reason(s): "sad/ hopeless/ anxious/ empty" "have other questions/ problems"  Received second EMMI red-Alert notification as above from [place] this morning re:  Toula Moos, 74 y/o female referred to [place] by [facility] Liaison during extended inpatient hospitalization February 9- January 03, 2020 for pancreatitis related to gallstones/ UTI; patient had multiple complications during hospitalization, including sepsis, new onset A-Fib with RVR; GI bleeding; and acute renal failure requiring hemodialysis. Patient was discharged home to self care with home health services in place ([name]) for PT.Patient has history including, but not limited to, DM-II; HTN/ HLD; GERD; and CKD-III.  Patient was engaged in [place] services on Thursday, January 05, 2020 after her hospital discharge; at that time, patient noted to have multiple clinical concerns post-hospital discharge on January 03, 2020. Patient had office visit with PCP on Friday, January 06, 2020 and was advised to return to the ED, where she was re-admitted for ongoing clinical concerns and was diagnosed with acute necrotizing pancreatitis; she was sent urgently by [facility] from ED at [facility] back to [facility] and is currently admitted with pending discharge plans.  Plan:  All red-Alert items addressed with patient at time of initial [facility] RN CM outreach on January 05, 2020 (see note dated 01/05/20); EMMI red-Alert  call again deferred as patient was re-admitted to hospital on Friday 01/06/20  [name], RN, BSN, [employer] Coordinator [facility] Care Management  [phone]

## 2020-01-11 ENCOUNTER — Ambulatory Visit: Payer: Self-pay | Admitting: *Deleted

## 2020-01-11 ENCOUNTER — Inpatient Hospital Stay (HOSPITAL_COMMUNITY): Payer: Medicare Other

## 2020-01-11 ENCOUNTER — Inpatient Hospital Stay: Payer: Self-pay

## 2020-01-11 ENCOUNTER — Other Ambulatory Visit: Payer: Self-pay | Admitting: *Deleted

## 2020-01-11 DIAGNOSIS — Z7189 Other specified counseling: Secondary | ICD-10-CM

## 2020-01-11 DIAGNOSIS — Z515 Encounter for palliative care: Secondary | ICD-10-CM

## 2020-01-11 LAB — COMPREHENSIVE METABOLIC PANEL
ALT: 17 U/L (ref 0–44)
AST: 25 U/L (ref 15–41)
Albumin: 2.2 g/dL — ABNORMAL LOW (ref 3.5–5.0)
Alkaline Phosphatase: 134 U/L — ABNORMAL HIGH (ref 38–126)
Anion gap: 10 (ref 5–15)
BUN: 24 mg/dL — ABNORMAL HIGH (ref 8–23)
CO2: 23 mmol/L (ref 22–32)
Calcium: 8.8 mg/dL — ABNORMAL LOW (ref 8.9–10.3)
Chloride: 101 mmol/L (ref 98–111)
Creatinine, Ser: 1.35 mg/dL — ABNORMAL HIGH (ref 0.44–1.00)
GFR calc Af Amer: 44 mL/min — ABNORMAL LOW (ref >60)
GFR calc non Af Amer: 38 mL/min — ABNORMAL LOW (ref >60)
Glucose, Bld: 308 mg/dL — ABNORMAL HIGH (ref 70–99)
Potassium: 4 mmol/L (ref 3.5–5.1)
Sodium: 134 mmol/L — ABNORMAL LOW (ref 135–145)
Total Bilirubin: 0.5 mg/dL (ref 0.3–1.2)
Total Protein: 5.5 g/dL — ABNORMAL LOW (ref 6.5–8.1)

## 2020-01-11 LAB — CBC WITH DIFFERENTIAL/PLATELET
Abs Immature Granulocytes: 0 10*3/uL (ref 0.00–0.07)
Basophils Absolute: 0.2 10*3/uL — ABNORMAL HIGH (ref 0.0–0.1)
Basophils Relative: 2 %
Eosinophils Absolute: 0 10*3/uL (ref 0.0–0.5)
Eosinophils Relative: 0 %
HCT: 24.4 % — ABNORMAL LOW (ref 36.0–46.0)
Hemoglobin: 7.6 g/dL — ABNORMAL LOW (ref 12.0–15.0)
Lymphocytes Relative: 5 %
Lymphs Abs: 0.6 10*3/uL — ABNORMAL LOW (ref 0.7–4.0)
MCH: 27 pg (ref 26.0–34.0)
MCHC: 31.1 g/dL (ref 30.0–36.0)
MCV: 86.5 fL (ref 80.0–100.0)
Monocytes Absolute: 1.1 10*3/uL — ABNORMAL HIGH (ref 0.1–1.0)
Monocytes Relative: 10 %
Neutro Abs: 9.3 10*3/uL — ABNORMAL HIGH (ref 1.7–7.7)
Neutrophils Relative %: 83 %
Platelets: 293 10*3/uL (ref 150–400)
RBC: 2.82 MIL/uL — ABNORMAL LOW (ref 3.87–5.11)
RDW: 15.4 % (ref 11.5–15.5)
WBC: 11.2 10*3/uL — ABNORMAL HIGH (ref 4.0–10.5)
nRBC: 0 /100 WBC
nRBC: 0.3 % — ABNORMAL HIGH (ref 0.0–0.2)

## 2020-01-11 LAB — GLUCOSE, CAPILLARY
Glucose-Capillary: 176 mg/dL — ABNORMAL HIGH (ref 70–99)
Glucose-Capillary: 205 mg/dL — ABNORMAL HIGH (ref 70–99)
Glucose-Capillary: 250 mg/dL — ABNORMAL HIGH (ref 70–99)
Glucose-Capillary: 293 mg/dL — ABNORMAL HIGH (ref 70–99)
Glucose-Capillary: 310 mg/dL — ABNORMAL HIGH (ref 70–99)
Glucose-Capillary: 312 mg/dL — ABNORMAL HIGH (ref 70–99)

## 2020-01-11 LAB — HEMOGLOBIN AND HEMATOCRIT, BLOOD
HCT: 25.3 % — ABNORMAL LOW (ref 36.0–46.0)
HCT: 23.8 % — ABNORMAL LOW (ref 36.0–46.0)
Hemoglobin: 7.9 g/dL — ABNORMAL LOW (ref 12.0–15.0)
Hemoglobin: 7.5 g/dL — ABNORMAL LOW (ref 12.0–15.0)

## 2020-01-11 LAB — IRON AND TIBC
Iron: 18 ug/dL — ABNORMAL LOW (ref 28–170)
Saturation Ratios: 10 % — ABNORMAL LOW (ref 10.4–31.8)
TIBC: 172 ug/dL — ABNORMAL LOW (ref 250–450)
UIBC: 154 ug/dL

## 2020-01-11 LAB — FERRITIN: Ferritin: 271 ng/mL (ref 11–307)

## 2020-01-11 LAB — TYPE AND SCREEN
ABO/RH(D): B POS
Antibody Screen: NEGATIVE

## 2020-01-11 LAB — FOLATE: Folate: 15.1 ng/mL (ref >5.9)

## 2020-01-11 LAB — VITAMIN B12: Vitamin B-12: 998 pg/mL — ABNORMAL HIGH (ref 180–914)

## 2020-01-11 MED ORDER — METOCLOPRAMIDE HCL 5 MG/ML IJ SOLN
10.0000 mg | Freq: Once | INTRAMUSCULAR | Status: AC
  Administered 2020-01-11: 10 mg via INTRAVENOUS

## 2020-01-11 MED ORDER — HYDROMORPHONE HCL 1 MG/ML IJ SOLN
0.5000 mg | Freq: Four times a day (QID) | INTRAMUSCULAR | Status: DC
  Administered 2020-01-11 – 2020-01-15 (×16): 0.5 mg via INTRAVENOUS
  Filled 2020-01-11 (×16): qty 0.5

## 2020-01-11 MED ORDER — SODIUM CHLORIDE 0.9 % IV SOLN
8.0000 mg/h | INTRAVENOUS | Status: AC
  Administered 2020-01-11 – 2020-01-14 (×5): 8 mg/h via INTRAVENOUS
  Filled 2020-01-11 (×8): qty 80

## 2020-01-11 MED ORDER — METOPROLOL TARTRATE 5 MG/5ML IV SOLN
5.0000 mg | Freq: Four times a day (QID) | INTRAVENOUS | Status: DC
  Administered 2020-01-11 – 2020-01-15 (×17): 5 mg via INTRAVENOUS
  Filled 2020-01-11 (×17): qty 5

## 2020-01-11 MED ORDER — HYDROMORPHONE HCL 1 MG/ML IJ SOLN
0.2500 mg | INTRAMUSCULAR | Status: DC | PRN
  Administered 2020-01-12 – 2020-01-13 (×2): 0.25 mg via INTRAVENOUS
  Filled 2020-01-11 (×3): qty 0.5

## 2020-01-11 MED ORDER — METOCLOPRAMIDE HCL 5 MG/ML IJ SOLN
10.0000 mg | Freq: Four times a day (QID) | INTRAMUSCULAR | Status: AC
  Administered 2020-01-11 – 2020-01-12 (×4): 10 mg via INTRAVENOUS
  Filled 2020-01-11 (×4): qty 2

## 2020-01-11 MED ORDER — PANTOPRAZOLE SODIUM 40 MG IV SOLR: 40.0000 mg | Freq: Two times a day (BID) | INTRAVENOUS | Status: DC

## 2020-01-11 MED ORDER — IOHEXOL 300 MG/ML  SOLN
100.0000 mL | Freq: Once | INTRAMUSCULAR | Status: AC | PRN
  Administered 2020-01-11: 100 mL via INTRAVENOUS

## 2020-01-11 MED ORDER — HYDROMORPHONE HCL 1 MG/ML IJ SOLN
0.2500 mg | INTRAMUSCULAR | Status: DC | PRN
  Administered 2020-01-11: 0.25 mg via INTRAVENOUS
  Filled 2020-01-11: qty 0.5

## 2020-01-11 MED ORDER — LABETALOL HCL 5 MG/ML IV SOLN: 5.0000 mg | INTRAVENOUS | Status: DC | PRN

## 2020-01-11 NOTE — Consult Note (Addendum)
Consultation Note Date: 01/11/2020   Patient Name: Katherine Oliver  DOB: 1946-06-10  MRN: [id number]  Age / Sex: 74 y.o., female  PCP: [name], MD Referring Physician: Donne Hazel, MD  Reason for Consultation: Establishing goals of care  HPI/Patient Profile: 74 y.o. female  with past medical history of lung nodules under surveillance and stable, pancreatitis, hypertension, hyperlipidemia, diabetes, recurrent UTIs. Recent hospitalization 12/13/19-01/03/20 for gallstone pancreatitis and choledocholithiasis with stent placed via ERCP with complications of atrial fibrillation and need for temporary dialysis (dialysis on hold with close monitoring) as well as concern for abd compartment syndrome admitted on 01/07/2020 with recurrent abd pain due to necrotizing pancreatitis (concern for worsening) as well as gastric ulcers and diffuse gastritis. Plans for repeat CT abd/pelvis when renal function best allows. GI following and considering cystogastrostomy. Surgery has recommended monitoring with no plans for surgical intervention. IR consider drain placement if no other options due to likelihood of drain becoming chronic.   Clinical Assessment and Goals of Care: I have reviewed electronic records and also reviewed case with bedside RN Barnett Applebaum as well as with Dr. Wyline Copas. Katherine Oliver is having a difficult day today with nausea and vomiting that worsens with any movement/activity. Anytime she is cleaned up from bowel/bladder incontinence her nausea worsens. Vomiting bloody emesis and plans for CT abd/pelvis today but concern for how renal function will respond to IV contrast. I called and provided update to husband, [name]. I also discussed with patient, husband, Dr. Wyline Copas, and nurse Barnett Applebaum d/c PCA as she forgets to use and to transition to scheduled and as needed doses for RN to manage for patient.   I met today with  [name] after he arrived to bedside. We discussed awaiting results from CT scan and GI input. Awaiting recommendations and options for next steps. I express my concern that she seems to be worsening overall with greater symptom burden. I worry that into the near future there may not be any good options that will provide her with a good probability of recovery and what this would mean. [name] shares that at that time he would consider second opinion. I acknowledged this and we agree to see what CT scan shows and have specialists weigh in on options. Also will await and see how kidney function responds and he confirms that she would want further dialysis if indicated - I did explain that there is the chance her renal function will not improve as it did before with temporary dialysis and he understands.   We also discussed Advance Directive and I provided one for Katherine Oliver and one for himself. I also asked if they have had any discussions regarding life support and CPR previously. He tells me that he feels that this would depend on the situation but he does not feel she would want to live on machines if she were in a vegetative state. I explained that at difficult as these discussions and decisions are that they are important to consider and think about as we do  not know what the future may hold and it is always good to be as prepared as possible. He understands.   All questions/concerns addressed. Emotional support provided. Updated Dr. Wyline Copas.   Primary Decision Maker PATIENT and then husband would be surrogate decision maker    SUMMARY OF RECOMMENDATIONS   - Watchful waiting - Hopeful for improvement  Code Status/Advance Care Planning:  Full code   Symptom Management:   Abd pain: D/C PCA. Scheduled dilaudid 0.5 mg IV every 6 hours and 0.25 mg IV every 2 hours prn. Utillized 0 mg over past 8 hours and total 4 mg over past 24 hours at time of PCA review. Struggling to utilize PCA button and  forgets to use and calls nurse instead. Denies pain but endorses nausea at time of my first visit and complains of pain and nausea at time of afternoon visit.   Intractable nausea/vomiting: Scheduled Reglan in place. As needed Zofran in place. As needed phenergan in place as well.   Palliative Prophylaxis:   Aspiration, Bowel Regimen, Delirium Protocol, Frequent Pain Assessment, Oral Care and Turn Reposition  Additional Recommendations (Limitations, Scope, Preferences):  Full Scope Treatment  Psycho-social/Spiritual:   Desire for further Chaplaincy support:yes  Additional Recommendations: Caregiving  Support/Resources  Prognosis:   Overall prognosis guarded.   Discharge Planning: To Be Determined      Primary Diagnoses: Present on Admission: . Acute necrotizing pancreatitis . AF (paroxysmal atrial fibrillation) ([place]) . AKI (acute kidney injury) ([place]) . HTN (hypertension) . Acute pancreatitis   I have reviewed the medical record, interviewed the patient and family, and examined the patient. The following aspects are pertinent.  Past Medical History:  Diagnosis Date  . Allergic rhinitis   . Arthritis   . 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 nephropathy ([place])    DSME at [facility] 01/2016   . Frequent epistaxis 05/16/2019   S/p cauterization with resolution 2020  . GERD (gastroesophageal reflux disease)   . Hepatic steatosis    by abd [place] 05/2012, mild transaminitis - normal iron sat and viral hep panel (2011), stable [place] 2017  . History of chicken pox   . History of measles   . History of recurrent UTIs    on chronic keflex  . HLD (hyperlipidemia)   . HTN (hypertension)   . 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 Kidney cyst, acquired 01/2016   L kidney by [place]  . Kidney stone 01/2016   L kidney by [place]  . Lung  nodules 11/2013   overall stable on f/u CT 01/2016  . Osteopenia 06/2013   mild, forearm T -1.1, hip and spine WNL  . Pancreatitis   . Polycythemia    mild, stable (2013)  . Primary localized osteoarthritis of right knee 01/05/2019  . Rosacea    metrogel   Social History   Socioeconomic History  . Marital status: Married    Spouse name: Not on file  . Number of children: Not on file  . Years of education: Not on file  . Highest education level: Not on file  Occupational History  . Not on file  Tobacco Use  . Smoking status: Never Smoker  . Smokeless tobacco: Never Used  Substance and Sexual Activity  . Alcohol use: Not Currently    Alcohol/week: 1.0 - 2.0 standard  drinks    Types: 1 - 2 Glasses of wine per week    Comment: Occasional/1 mixed drink per month  . Drug use: No  . Sexual activity: Yes  Other Topics Concern  . 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 Daily walking about 20 min.    Diet: ok water intake 4 glasses/day, daily fruits/vegetables, red meat 4x/wk, fish 3-4x/wk   Social Determinants of Health   Financial Resource Strain: Low Risk   . Difficulty of Paying Living Expenses: Not hard at all  Food Insecurity: No Food Insecurity  . Worried About Charity fundraiser in the Last Year: Never true  . Ran Out of Food in the Last Year: Never true  Transportation Needs: No Transportation Needs  . Lack of Transportation (Medical): No  . Lack of Transportation (Non-Medical): No  Physical Activity: Inactive  . Days of Exercise per Week: 0 days  . Minutes of Exercise per Session: 0 min  Stress: No Stress Concern Present  . Feeling of Stress : Not at all  Social Connections:   . Frequency of Communication with Friends and Family: Not on file  .  Frequency of Social Gatherings with Friends and Family: Not on file  . Attends Religious Services: Not on file  . Active Member of Clubs or Organizations: Not on file  . Attends Archivist Meetings: Not on file  . Marital Status: Not on file   Family History  Problem Relation Age of Onset  . Stroke Mother        several  . Hyperlipidemia Mother   . Hypertension Mother   . Cancer Father        colon  . Hypertension Father   . Hyperlipidemia Father   . Coronary artery disease Father 65       MIx1, CABG  . Cancer Paternal Aunt        abdominal  . Coronary artery disease Maternal Grandmother   . Diabetes Maternal Grandfather   . Coronary artery disease Maternal Grandfather   . Breast cancer Neg Hx    Scheduled Meds: . HYDROmorphone   Intravenous Q4H  . insulin aspart  0-15 Units Subcutaneous Q4H  . insulin glargine  10 Units Subcutaneous Daily  . metoCLOPramide (REGLAN) injection  10 mg Intravenous Q6H  . metoprolol tartrate  5 mg Intravenous Q6H  . nystatin cream   Topical BID   Continuous Infusions: . lactated ringers 75 mL/hr at 01/11/20 1007  . meropenem (MERREM) IV 1 g (01/11/20 0954)  . pantoprozole (PROTONIX) infusion     PRN Meds:.diphenhydrAMINE **OR** diphenhydrAMINE, labetalol, naloxone **AND** sodium chloride flush, ondansetron **OR** ondansetron (ZOFRAN) IV, promethazine Allergies  Allergen Reactions  . Azithromycin Itching    Okay if takes benadryl along with it  . Nickel     Reaction to cheap earrings  . Sulfa Antibiotics     Itching.   [name] [Acetic Acid]     Nausea.   . Adhesive [Tape] Rash    Paper tape - blisters   Review of Systems  Unable to perform ROS: Acuity of condition  Constitutional:       Nausea and decreased mentation secondary to medication  Gastrointestinal: Positive for abdominal distention, abdominal pain, nausea and vomiting.  Physical Exam Vitals and nursing note reviewed.  Constitutional:      Appearance:  She is ill-appearing.     Comments: Pallor  Cardiovascular:     Rate and Rhythm: Tachycardia present.  Pulmonary:     Effort: Pulmonary effort is normal. No tachypnea, accessory muscle usage or respiratory distress.  Abdominal:     General: There is distension.     Tenderness: There is abdominal tenderness.  Neurological:     Mental Status: She is lethargic.     Vital Signs: BP (!) 169/89 (BP Location: Left Arm)   Pulse (!) 115   Temp 98.5 F (36.9 C) (Oral)   Resp 19   Ht '5\' 1"'$  (1.549 m)   Wt 87.2 kg   SpO2 97%   BMI 36.32 kg/m  Pain Scale: 0-10 POSS *See Group Information*: 1-Acceptable,Awake and alert Pain Score: 7    SpO2: SpO2: 97 % O2 Device:SpO2: 97 % O2 Flow Rate: .O2 Flow Rate (L/min): 2 L/min  IO: Intake/output summary:   Intake/Output Summary (Last 24 hours) at 01/11/2020 1023 Last data filed at 01/11/2020 0800 Gross per 24 hour  Intake 1565.59 ml  Output --  Net 1565.59 ml    LBM: Last BM Date: 01/10/20 Baseline Weight: Weight: 80.2 kg Most recent weight: Weight: 87.2 kg     Palliative Assessment/Data:     Time In/Out: 1130-1210, 1430-1530 Time Total: 70 min Greater than 50%  of this time was spent counseling and coordinating care related to the above assessment and plan.  Signed by: Vinie Sill, NP Palliative Medicine Team Pager # [phone] (M-F 8a-5p) Team Phone # [phone] (Nights/Weekends)

## 2020-01-11 NOTE — Progress Notes (Signed)
Pt has vomited at 0700 ans 0800 green bile, zofran given. 0900 p;t began to vomit Bright red Blood. Dr. Wyline Copas made aware and at the bedside. Order to hold tube feedings. Dr. Therisa Doyne paged and awaiting phone call back.

## 2020-01-11 NOTE — Progress Notes (Signed)
PT Cancellation Note  Patient Details Name: Katherine Oliver MRN: [id number] DOB: 1946-07-28   Cancelled Treatment:    Reason Eval/Treat Not Completed: Other (comment) Pt recently back from CT, with multiple emesis episodes today and fatigued. Assisted onto bed pan as pt with bowel incontinence and RN notified.     Wyona Almas, PT, DPT Acute Rehabilitation Services Pager [phone] Office [phone]    Deno Etienne 01/11/2020, 4:10 PM

## 2020-01-11 NOTE — Patient Outreach (Signed)
[name]) Care Management [place] Telephone Outreach Care Coordination  01/11/2020  [name] [birth year] [id number]  [place] CM Telephone Outreach Care Coordination re:  Katherine Oliver, 74 y/o female referred to [place] by [facility] Liaison during extended inpatient hospitalization February 9- January 03, 2020 for pancreatitis related to gallstones/ UTI; patient had multiple complications during hospitalization, including sepsis, new onset A-Fib with RVR; GI bleeding; and acute renal failure requiring hemodialysis. Patient was discharged home to self care with home health services in place ([name]) for PT.Patient has history including, but not limited to, DM-II; HTN/ HLD; GERD; and CKD-III.  Patient was engaged in [name] services on Thursday, January 05, 2020 after her hospital discharge; at that time, patient noted to have multiple clinical concerns post-hospital discharge on January 03, 2020.  Patient had office visit with PCP on Friday, January 06, 2020 and was advised to return to the ED, where she was re-admitted for ongoing clinical concerns and was diagnosed with acute necrotizing pancreatitis; she was sent urgently by [facility] from ED at [facility] back to [facility] and is currently admitted with pending discharge plans.  Received incoming voice mail from patient's husband this morning, telling me that patient had been re-admitted; he requested that I call him this afternoon at our (previously) scheduled time.  Contacted patient's husband as he requested and updated him that I was aware of patient's hospital re-admission and had asked [facility] RN [place] to continue following patient during her re-admission; I also assured him that I am also following patient's progress via EMR; husband relieved and appreciative of call; stated patient has "had a very rough day today" and adds that he is afraid patient will again have another  extended hospitalization; emotional support provided.  Assured spouse that once patient is discharged home from hospital, I would resume following and would contact them promptly.  [name], RN, BSN, [employer] [facility] Care Management  [phone]

## 2020-01-11 NOTE — Progress Notes (Addendum)
Subjective: Patient had episodes of vomiting today morning, first 2 episodes were liquid, green bile, third episode was bright red blood, since then she has had coffee-ground emesis. Patient complains of abdominal pain. She had liquid loose bowel movement, yellow in color yesterday and today morning. Her tube feedings have been discontinued since she started vomiting.  Objective: Vital signs in last 24 hours: Temp:  [97.8 F (36.6 C)-98.8 F (37.1 C)] 98.5 F (36.9 C) (03/10 0711) Pulse Rate:  [104-115] 115 (03/10 0711) Resp:  [16-20] 19 (03/10 0800) BP: (141-169)/(62-89) 169/89 (03/10 0711) SpO2:  [92 %-97 %] 97 % (03/10 0800) Weight:  [87.2 kg] 87.2 kg (03/10 0031) Weight change:  Last BM Date: 01/10/20  PE: In distress, emesis basin at bedside with coffee-ground fluid GENERAL: Uncomfortable ABDOMEN: Distended abdomen, tense, diffuse abdominal tenderness, hypoactive bowel sounds EXTREMITIES: No deformity  Lab Results: Results for orders placed or performed during the hospital encounter of 01/07/20 (from the past 48 hour(s))  Glucose, capillary     Status: Abnormal   Collection Time: 01/09/20 12:22 PM  Result Value Ref Range   Glucose-Capillary 154 (H) 70 - 99 mg/dL    Comment: Glucose reference range applies only to samples taken after fasting for at least 8 hours.  Glucose, capillary     Status: Abnormal   Collection Time: 01/09/20  5:27 PM  Result Value Ref Range   Glucose-Capillary 222 (H) 70 - 99 mg/dL    Comment: Glucose reference range applies only to samples taken after fasting for at least 8 hours.  Glucose, capillary     Status: Abnormal   Collection Time: 01/09/20  8:48 PM  Result Value Ref Range   Glucose-Capillary 270 (H) 70 - 99 mg/dL    Comment: Glucose reference range applies only to samples taken after fasting for at least 8 hours.  Glucose, capillary     Status: Abnormal   Collection Time: 01/09/20 11:52 PM  Result Value Ref Range   Glucose-Capillary  344 (H) 70 - 99 mg/dL    Comment: Glucose reference range applies only to samples taken after fasting for at least 8 hours.  Comprehensive metabolic panel     Status: Abnormal   Collection Time: 01/10/20  2:34 AM  Result Value Ref Range   Sodium 134 (L) 135 - 145 mmol/L   Potassium 3.9 3.5 - 5.1 mmol/L   Chloride 102 98 - 111 mmol/L   CO2 21 (L) 22 - 32 mmol/L   Glucose, Bld 329 (H) 70 - 99 mg/dL    Comment: Glucose reference range applies only to samples taken after fasting for at least 8 hours.   BUN 21 8 - 23 mg/dL   Creatinine, Ser 1.46 (H) 0.44 - 1.00 mg/dL   Calcium 8.6 (L) 8.9 - 10.3 mg/dL   Total Protein 5.5 (L) 6.5 - 8.1 g/dL   Albumin 2.2 (L) 3.5 - 5.0 g/dL   AST 15 15 - 41 U/L   ALT 13 0 - 44 U/L   Alkaline Phosphatase 116 38 - 126 U/L   Total Bilirubin 0.3 0.3 - 1.2 mg/dL   GFR calc non Af Amer 35 (L) >60 mL/min   GFR calc Af Amer 40 (L) >60 mL/min   Anion gap 11 5 - 15    Comment: Performed at [facility] [address], [place], [place] [address]  CBC with Differential/Platelet     Status: Abnormal   Collection Time: 01/10/20  2:34 AM  Result Value Ref  Range   WBC 9.4 4.0 - 10.5 K/uL   RBC 2.92 (L) 3.87 - 5.11 MIL/uL   Hemoglobin 7.9 (L) 12.0 - 15.0 g/dL   HCT 25.0 (L) 36.0 - 46.0 %   MCV 85.6 80.0 - 100.0 fL   MCH 27.1 26.0 - 34.0 pg   MCHC 31.6 30.0 - 36.0 g/dL   RDW 15.3 11.5 - 15.5 %   Platelets 246 150 - 400 K/uL   nRBC 0.0 0.0 - 0.2 %   Neutrophils Relative % 70 %   Neutro Abs 6.6 1.7 - 7.7 K/uL   Lymphocytes Relative 9 %   Lymphs Abs 0.8 0.7 - 4.0 K/uL   Monocytes Relative 18 %   Monocytes Absolute 1.7 (H) 0.1 - 1.0 K/uL   Eosinophils Relative 0 %   Eosinophils Absolute 0.0 0.0 - 0.5 K/uL   Basophils Relative 0 %   Basophils Absolute 0.0 0.0 - 0.1 K/uL   Immature Granulocytes 3 %   Abs Immature Granulocytes 0.23 (H) 0.00 - 0.07 K/uL    Comment: Performed at [facility], [address] [place], [place] [address]  Magnesium      Status: None   Collection Time: 01/10/20  2:34 AM  Result Value Ref Range   Magnesium 1.7 1.7 - 2.4 mg/dL    Comment: Performed at [facility] [address] [place], [place] [address]  Phosphorus     Status: None   Collection Time: 01/10/20  2:34 AM  Result Value Ref Range   Phosphorus 2.7 2.5 - 4.6 mg/dL    Comment: Performed at [facility] [address], [place], [place] [address]  Glucose, capillary     Status: Abnormal   Collection Time: 01/10/20  4:09 AM  Result Value Ref Range   Glucose-Capillary 325 (H) 70 - 99 mg/dL    Comment: Glucose reference range applies only to samples taken after fasting for at least 8 hours.  Glucose, capillary     Status: Abnormal   Collection Time: 01/10/20  8:38 AM  Result Value Ref Range   Glucose-Capillary 284 (H) 70 - 99 mg/dL    Comment: Glucose reference range applies only to samples taken after fasting for at least 8 hours.  Glucose, capillary     Status: Abnormal   Collection Time: 01/10/20 11:55 AM  Result Value Ref Range   Glucose-Capillary 291 (H) 70 - 99 mg/dL    Comment: Glucose reference range applies only to samples taken after fasting for at least 8 hours.   Comment 1 Notify RN    Comment 2 Document in Chart   Glucose, capillary     Status: Abnormal   Collection Time: 01/10/20  3:59 PM  Result Value Ref Range   Glucose-Capillary 308 (H) 70 - 99 mg/dL    Comment: Glucose reference range applies only to samples taken after fasting for at least 8 hours.  Glucose, capillary     Status: Abnormal   Collection Time: 01/10/20  8:20 PM  Result Value Ref Range   Glucose-Capillary 299 (H) 70 - 99 mg/dL    Comment: Glucose reference range applies only to samples taken after fasting for at least 8 hours.  Glucose, capillary     Status: Abnormal   Collection Time: 01/11/20 12:32 AM  Result Value Ref Range   Glucose-Capillary 312 (H) 70 - 99 mg/dL    Comment: Glucose reference range applies only to samples taken after  fasting for at least  8 hours.  Type and screen [place]     Status: None   Collection Time: 01/11/20  4:35 AM  Result Value Ref Range   ABO/RH(D) B POS    Antibody Screen NEG    Sample Expiration      01/14/2020,2359 Performed at [facility], [place] [address] [place], [place] [address]   CBC with Differential/Platelet     Status: Abnormal   Collection Time: 01/11/20  4:40 AM  Result Value Ref Range   WBC 11.2 (H) 4.0 - 10.5 K/uL   RBC 2.82 (L) 3.87 - 5.11 MIL/uL   Hemoglobin 7.6 (L) 12.0 - 15.0 g/dL   HCT 24.4 (L) 36.0 - 46.0 %   MCV 86.5 80.0 - 100.0 fL   MCH 27.0 26.0 - 34.0 pg   MCHC 31.1 30.0 - 36.0 g/dL   RDW 15.4 11.5 - 15.5 %   Platelets 293 150 - 400 K/uL   nRBC 0.3 (H) 0.0 - 0.2 %   Neutrophils Relative % 83 %   Neutro Abs 9.3 (H) 1.7 - 7.7 K/uL   Lymphocytes Relative 5 %   Lymphs Abs 0.6 (L) 0.7 - 4.0 K/uL   Monocytes Relative 10 %   Monocytes Absolute 1.1 (H) 0.1 - 1.0 K/uL   Eosinophils Relative 0 %   Eosinophils Absolute 0.0 0.0 - 0.5 K/uL   Basophils Relative 2 %   Basophils Absolute 0.2 (H) 0.0 - 0.1 K/uL   nRBC 0 0 /100 WBC   Abs Immature Granulocytes 0.00 0.00 - 0.07 K/uL   Polychromasia PRESENT     Comment: Performed at [facility], [place] [address], [place], [place] [address]  Comprehensive metabolic panel     Status: Abnormal   Collection Time: 01/11/20  4:40 AM  Result Value Ref Range   Sodium 134 (L) 135 - 145 mmol/L   Potassium 4.0 3.5 - 5.1 mmol/L   Chloride 101 98 - 111 mmol/L   CO2 23 22 - 32 mmol/L   Glucose, Bld 308 (H) 70 - 99 mg/dL    Comment: Glucose reference range applies only to samples taken after fasting for at least 8 hours.   BUN 24 (H) 8 - 23 mg/dL   Creatinine, Ser 1.35 (H) 0.44 - 1.00 mg/dL   Calcium 8.8 (L) 8.9 - 10.3 mg/dL   Total Protein 5.5 (L) 6.5 - 8.1 g/dL   Albumin 2.2 (L) 3.5 - 5.0 g/dL   AST 25 15 - 41 U/L   ALT 17 0 - 44 U/L   Alkaline Phosphatase 134 (H) 38 - 126 U/L   Total  Bilirubin 0.5 0.3 - 1.2 mg/dL   GFR calc non Af Amer 38 (L) >60 mL/min   GFR calc Af Amer 44 (L) >60 mL/min   Anion gap 10 5 - 15    Comment: Performed at [facility] [address] [place], [place] [address]  Vitamin B12     Status: Abnormal   Collection Time: 01/11/20  4:40 AM  Result Value Ref Range   Vitamin B-12 998 (H) 180 - 914 pg/mL    Comment: (NOTE) This assay is not validated for testing neonatal or myeloproliferative syndrome specimens for Vitamin B12 levels. Performed at [facility], [place] [address], [place], [place] [address]   Folate     Status: None   Collection Time: 01/11/20  4:40 AM  Result Value Ref Range   Folate 15.1 >5.9 ng/mL  Comment: Performed at [facility], [place] [address] [place], [place] [address]  Iron and TIBC     Status: Abnormal   Collection Time: 01/11/20  4:40 AM  Result Value Ref Range   Iron 18 (L) 28 - 170 ug/dL   TIBC 172 (L) 250 - 450 ug/dL   Saturation Ratios 10 (L) 10.4 - 31.8 %   UIBC 154 ug/dL    Comment: Performed at [facility], [place] [address] [place], [place] [address]  Ferritin     Status: None   Collection Time: 01/11/20  4:40 AM  Result Value Ref Range   Ferritin 271 11 - 307 ng/mL    Comment: Performed at [facility], [employer] [address] [place], [place] [address]  Glucose, capillary     Status: Abnormal   Collection Time: 01/11/20  4:45 AM  Result Value Ref Range   Glucose-Capillary 310 (H) 70 - 99 mg/dL    Comment: Glucose reference range applies only to samples taken after fasting for at least 8 hours.  Glucose, capillary     Status: Abnormal   Collection Time: 01/11/20  8:33 AM  Result Value Ref Range   Glucose-Capillary 293 (H) 70 - 99 mg/dL    Comment: Glucose reference range applies only to samples taken after fasting for at least 8 hours.   Comment 1 Notify RN    Comment 2 Document in Chart     Studies/Results: DG Abd Portable 1V  Result Date: 01/09/2020 CLINICAL DATA:   Feeding tube placement. EXAM: PORTABLE ABDOMEN - 1 VIEW COMPARISON:  12/16/2019 FINDINGS: Enteric feeding tube tip projects at the level of the ligament of Treitz, well positioned. There is a biliary stent which extends from the second portion of the duodenum, based on the enteric tube placement, to just inferior and medially to cholecystectomy surgical clips. There is normal bowel gas pattern. IMPRESSION: 1. Well-positioned enteric tube Electronically Signed   By: Lajean Manes M.D.   On: 01/09/2020 12:25    Medications: I have reviewed the patient's current medications.  Assessment: Complicated pancreatitis-with necrosis, pancreatic and multiple peripancreatic fluid collections  Hematemesis, coffee ground emesis EGD from 12/19/2019 showed multiple gastric ulcers, diffuse gastritis Ongoing anemia, hemoglobin 7.6  Renal impairment, improving, BUN 24/creatinine 1.35/GFR 38  Mild leukocytosis   Plan: Hematemesis and coffee-ground emesis likely related to stress gastritis and gastric ulcers. Patient n.p.o., will give Reglan 10 mg IV x1, and continue on Reglan 10 mg IV every 6 hours for the next 24 hours. We will start patient on IV Protonix drip.  We were planning on doing a CAT scan with contrast, once renal impairment completely resolves, to better define the peripancreatic collections, as there are concerns for necrosis and hemorrhagic conversion. However, clinically patient is worsening, and today with coffee-ground emesis and hematemesis, recommend doing a CAT scan of the abdomen and pelvis with IV contrast. During the last admission, patient required hemodialysis temporarily. She is aware that with IV contrast, her renal function might worsen and she may also end up needing dialysis. She understands and verbalizes consent.  On IV PCA pump for pain management Continue IV lactated Ringer's at 75 mL/h Continue IV Merrem 1 g every 12 hours Monitor H&H with plans to transfuse if  hemoglobin is less than 7. Guarded prognosis.   Ronnette Juniper, MD 01/11/2020, 9:59 AM

## 2020-01-11 NOTE — Progress Notes (Signed)
PROGRESS NOTE    Katherine Oliver  TGG:[id number] DOB: Mar 04, 1946 DOA: 01/07/2020 PCP: [name], MD    Brief Narrative:  74 year old female with history of diabetes, prior cholecystectomy in 2003, recent extensive hospitalization due to choledocholithiasis and necrotic pancreatitis, renal failure requiring hemodialysis who was discharged 2 days ago with planned outpatient follow-up.  She was diagnosed with necrotizing pancreatitis, underwent ERCP and stenting, had protracted hospitalization.  Continue to have poor appetite and abdominal pain so presented back to the [facility] hospital. In the emergency room, hemodynamically stable, repeat imaging studies showed increasing necrotic collection in the pancreas that replaced entire pancreas.  Also showed intraperitoneal and retroperitoneal phlegmonous changes. Pt admitted for further management.    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 Therisa Doyne on 01/11/2020. With acute hematemesis, have ordered CT abd/pelvis with IV contrast Dr. Horris Latino discussed with surgery who suggested no intervention. Dr. Rushie Chestnut discussed with interventional radiology, suggested no external drainage. Started PCA pump for pain control, bowel regimen Continued on empiric IV meropenem  AKI Labs reviewed, slowly improving Required dialysis briefly last admission Had declined HD initially, on discussion with GI,  pt had agreed to HD again only if needed See above, CT abd/pelvis with IV contrast was recommended by GI Repeat bmet in AM  Normocytic anemia/chronic disease with acute blood loss anemia Hemoglobin continues to be around baseline Large hematemesis noted the AM of 3/10. Discussed with GI. Concern for underlying bleeding ulcer Now on protonix gtt. Keep NPO, increase IVF rate Will follow q4h H/H, reviewed, thus far remaining stable Keep head of bed elevated over 30 degrees  Type 2 diabetes mellitus Last A1c 6.4 on 12/14/2019 Noted hyperglycemia Now on Lantus, SSI (adjust accordingly), continue with Accu-Cheks, hypoglycemic protocol Holding home Metformin while in hospital  Hypertension Remains stable at this time Norvasc on hold as pt is NPO  Paroxysmal A. Fib Currently rate controlled on sinus rhythm Not on anticoagulation Now off amiodarone as pt is NPO, cont on scheduled IV metoprolol for rate control  GERD Continue PPI as tolerated  Obesity Lifestyle modification advised  Goals of care Patient with very guarded prognosis Palliative consulted  DVT prophylaxis: SCD's Code Status: Full Family Communication: Pt in room, family not at bedside Disposition Plan: From home, d/c planning uncertain at this time as pt remains medically unstable for discharge. When GI signs off  Consultants:   GI  IR General Surgery  Palliative Care  Procedures:     Antimicrobials: Anti-infectives (From admission, onward)   Start     Dose/Rate Route Frequency Ordered Stop   01/10/20 1115  meropenem (MERREM) 1 g in sodium chloride 0.9 % 100 mL IVPB     1 g 200 mL/hr over 30 Minutes Intravenous Every 12 hours 01/10/20 1007     01/07/20 1800  fluconazole (DIFLUCAN) tablet 150 mg     150 mg Oral  Once 01/07/20 1610 01/07/20 1757   01/07/20 0200  meropenem (MERREM) 500 mg in sodium chloride 0.9 % 100 mL  IVPB  Status:  Discontinued     500 mg 200 mL/hr over 30 Minutes Intravenous  Every 12 hours 01/07/20 0145 01/09/20 1345       Subjective: Unable to assess as pt not very communicative while feeling uncomfortable laying on side with emesis basin  Objective: Vitals:   01/11/20 0711 01/11/20 0800 01/11/20 1100 01/11/20 1640  BP: (!) 169/89  (!) 165/74 (!) 166/75  Pulse: (!) 115  (!) 113 (!) 104  Resp: 20 19 20 20   Temp: 98.5 F (36.9 C)  98.5 F (36.9 C) 98.9 F (37.2 C)  TempSrc: Oral  Oral Oral  SpO2: 93% 97% 95% 100%  Weight:      Height:        Intake/Output Summary (Last 24 hours) at 01/11/2020 1718 Last data filed at 01/11/2020 1300 Gross per 24 hour  Intake 2342.01 ml  Output --  Net 2342.01 ml   Filed Weights   01/06/20 2336 01/09/20 0532 01/11/20 0031  Weight: 80.2 kg 82.3 kg 87.2 kg    Examination:  General exam: Appears uncomfortable laying on side with emesis basin Respiratory system: Clear to auscultation. Respiratory effort normal. Cardiovascular system: S1 & S2 heard, Regular Gastrointestinal system: Distended, decreased bs Central nervous system: Alert and oriented. No focal neurological deficits. Extremities: Symmetric 5 x 5 power. Skin: No rashes, lesions Psychiatry: difficult to assess as pt is not very verbal while laying on side   Data Reviewed: I have personally reviewed following labs and imaging studies  CBC: Recent Labs  Lab 01/07/20 0727 01/07/20 0727 01/08/20 0235 01/09/20 0202 01/10/20 0234 01/11/20 0440 01/11/20 0955  WBC 7.2  --  7.2 5.7 9.4 11.2*  --   NEUTROABS  --   --  5.5 4.0 6.6 9.3*  --   HGB 7.4*   < > 7.6* 7.9* 7.9* 7.6* 7.9*  HCT 24.0*   < > 24.9* 25.3* 25.0* 24.4* 25.3*  MCV 88.2  --  89.9 88.5 85.6 86.5  --   PLT 152  --  181 197 246 293  --    < > = values in this interval not displayed.   Basic Metabolic Panel: Recent Labs  Lab 01/07/20 0727 01/08/20 0235 01/09/20 0202 01/10/20 0234 01/11/20 0440  NA 140 137 133* 134* 134*  K 4.0 3.9 3.5 3.9 4.0  CL 108 105 102 102 101  CO2  21* 23 22 21* 23  GLUCOSE 221* 190* 192* 329* 308*  BUN 25* 23 22 21  24*  CREATININE 1.79* 1.68* 1.50* 1.46* 1.35*  CALCIUM 8.3* 8.3* 8.5* 8.6* 8.8*  MG  --  1.1* 1.9 1.7  --   PHOS  --  3.2 2.3* 2.7  --    GFR: Estimated Creatinine Clearance: 36.2 mL/min (A) (by C-G formula based on SCr of 1.35 mg/dL (H)). Liver Function Tests: Recent Labs  Lab 01/06/20 1339 01/08/20 0235 01/09/20 0202 01/10/20 0234 01/11/20 0440  AST 18 12* 15 15 25   ALT 12 10 12 13 17   ALKPHOS 135* 104 115 116 134*  BILITOT 1.1 0.7 0.8 0.3 0.5  PROT 6.9 5.0* 5.2* 5.5* 5.5*  ALBUMIN 3.3* 2.1* 2.2* 2.2* 2.2*   Recent Labs  Lab 01/04/20 1734 01/06/20 1339  LIPASE 35 27   No results for input(s): AMMONIA in the last 168 hours. Coagulation Profile: No results for input(s): INR, PROTIME in the last 168 hours. Cardiac Enzymes: No results for input(s): CKTOTAL, CKMB, CKMBINDEX, TROPONINI in the last 168 hours. BNP (last  3 results) No results for input(s): PROBNP in the last 8760 hours. HbA1C: No results for input(s): HGBA1C in the last 72 hours. CBG: Recent Labs  Lab 01/11/20 0032 01/11/20 0445 01/11/20 0833 01/11/20 1157 01/11/20 1632  GLUCAP 312* 310* 293* 250* 205*   Lipid Profile: No results for input(s): CHOL, HDL, LDLCALC, TRIG, CHOLHDL, LDLDIRECT in the last 72 hours. Thyroid Function Tests: No results for input(s): TSH, T4TOTAL, FREET4, T3FREE, THYROIDAB in the last 72 hours. Anemia Panel: Recent Labs    01/11/20 0440  VITAMINB12 998*  FOLATE 15.1  FERRITIN 271  TIBC 172*  IRON 18*   Sepsis Labs: No results for input(s): PROCALCITON, LATICACIDVEN in the last 168 hours.  Recent Results (from the past 240 hour(s))  Respiratory Panel by RT PCR (Flu A&B, Covid) - Nasopharyngeal Swab     Status: None   Collection Time: 01/06/20  5:16 PM   Specimen: Nasopharyngeal Swab  Result Value Ref Range Status   SARS Coronavirus 2 by RT PCR NEGATIVE NEGATIVE Final    Comment:  (NOTE) SARS-CoV-2 target nucleic acids are NOT DETECTED. The SARS-CoV-2 RNA is generally detectable in upper respiratoy specimens during the acute phase of infection. The lowest concentration of SARS-CoV-2 viral copies this assay can detect is 131 copies/mL. A negative result does not preclude SARS-Cov-2 infection and should not be used as the sole basis for treatment or other patient management decisions. A negative result may occur with  improper specimen collection/handling, submission of specimen other than nasopharyngeal swab, presence of viral mutation(s) within the areas targeted by this assay, and inadequate number of viral copies (<131 copies/mL). A negative result must be combined with clinical observations, patient history, and epidemiological information. The expected result is Negative. Fact Sheet for Patients:  PinkCheek.be Fact Sheet for Healthcare Providers:  GravelBags.it This test is not yet ap proved or cleared by the [place] FDA and  has been authorized for detection and/or diagnosis of SARS-CoV-2 by FDA under an Emergency Use Authorization (EUA). This EUA will remain  in effect (meaning this test can be used) for the duration of the COVID-19 declaration under Section 564(b)(1) of the Act, 21 U.S.C. section 360bbb-3(b)(1), unless the authorization is terminated or revoked sooner.    Influenza A by PCR NEGATIVE NEGATIVE Final   Influenza B by PCR NEGATIVE NEGATIVE Final    Comment: (NOTE) The Xpert Xpress SARS-CoV-2/FLU/RSV assay is intended as an aid in  the diagnosis of influenza from Nasopharyngeal swab specimens and  should not be used as a sole basis for treatment. Nasal washings and  aspirates are unacceptable for Xpert Xpress SARS-CoV-2/FLU/RSV  testing. Fact Sheet for Patients: PinkCheek.be Fact Sheet for Healthcare  Providers: GravelBags.it This test is not yet approved or cleared by the [place] FDA and  has been authorized for detection and/or diagnosis of SARS-CoV-2 by  FDA under an Emergency Use Authorization (EUA). This EUA will remain  in effect (meaning this test can be used) for the duration of the  Covid-19 declaration under Section 564(b)(1) of the Act, 21  U.S.C. section 360bbb-3(b)(1), unless the authorization is  terminated or revoked. Performed at [facility], [address] [place], [place] [address]      Radiology Studies: CT ABDOMEN PELVIS W CONTRAST  Result Date: 01/11/2020 CLINICAL DATA:  Abdominal abscess, infection suspected, nausea, vomiting, abdominal pain EXAM: CT ABDOMEN AND PELVIS WITH CONTRAST TECHNIQUE: Multidetector CT imaging of the abdomen and pelvis was performed using the standard protocol following bolus administration  of intravenous contrast. CONTRAST:  163mL OMNIPAQUE IOHEXOL 300 MG/ML  SOLN COMPARISON:  01/06/2020, 12/13/2019 FINDINGS: Lower chest: Small bilateral pleural effusions and associated atelectasis or consolidation, similar to prior examination. Hepatobiliary: No solid liver abnormality is seen. Status post cholecystectomy. Common bile duct stent remains in position. Pancreas: Redemonstrated large intermediate attenuation collection (HU = 34) replacing the majority of the pancreatic parenchyma, which is somewhat more organized appearing compared to prior examination and rim enhancing with administration of IV contrast. Overall dimensions measure at least 17.6 x 8.1 cm, slightly enlarged compared to prior examination. There is extensive, adjacent retroperitoneal and mesenteric inflammatory stranding. Spleen: Normal in size without significant abnormality. Adrenals/Urinary Tract: Adrenal glands are unremarkable. Kidneys are normal, without renal calculi, solid lesion, or hydronephrosis. Small volume of air in the urinary  bladder. Correlate with recent catheterization. Stomach/Bowel: Stomach is within normal limits. Interval placement of weighted enteric feeding tube, tip in the ascending duodenum. No evidence of bowel wall thickening, distention, or inflammatory changes originating from the bowel. Vascular/Lymphatic: Aortic atherosclerosis. No enlarged abdominal or pelvic lymph nodes. Reproductive: Status post hysterectomy. Redemonstrated 2.4 cm fluid attenuation lesion of the right ovary, likely a cyst and unchanged compared to prior (series 3, image 74). Other: Anasarca increased compared to prior examination. Moderate volume 4 quadrant ascites, slightly increased in volume compared to prior examination. Musculoskeletal: No acute or significant osseous findings. IMPRESSION: 1. Redemonstrated large intermediate attenuation collection replacing the majority of the pancreatic parenchyma, which is somewhat more organized appearing compared to prior examination and rim enhancing with administration of IV contrast. Overall dimensions measure at least 17.6 x 8.1 cm, slightly enlarged compared to prior examination. Findings remain consistent with acute necrotic pancreatic collection. The presence or absence of infection is not established by CT. No new fluid collections are identified in the abdomen. There is extensive adjacent retroperitoneal and mesenteric inflammatory stranding. 2. Common bile duct stent remains in position. 3. Moderate volume four quadrant ascites, slightly increased in volume compared to prior examination. Anasarca increased compared to prior examination. 4. Small bilateral pleural effusions and associated atelectasis or consolidation, similar to prior examination. 5. Aortic atherosclerosis (ICD10-I70.0). Electronically Signed   By: Eddie Candle M.D.   On: 01/11/2020 15:21   [place] EKG SITE RITE  Result Date: 01/11/2020 If Site Rite image not attached, placement could not be confirmed due to current cardiac  rhythm.   Scheduled Meds: .  HYDROmorphone (DILAUDID) injection  0.5 mg Intravenous Q6H  . insulin aspart  0-15 Units Subcutaneous Q4H  . insulin glargine  10 Units Subcutaneous Daily  . metoCLOPramide (REGLAN) injection  10 mg Intravenous Q6H  . metoprolol tartrate  5 mg Intravenous Q6H  . nystatin cream   Topical BID   Continuous Infusions: . lactated ringers 75 mL/hr at 01/11/20 1300  . meropenem (MERREM) IV 1 g (01/11/20 0954)  . pantoprozole (PROTONIX) infusion 8 mg/hr (01/11/20 1300)     LOS: 4 days   Marylu Lund, MD [facility] Pager On Amion  If 7PM-7AM, please contact night-coverage 01/11/2020, 5:18 PM

## 2020-01-12 ENCOUNTER — Ambulatory Visit: Payer: Self-pay | Admitting: *Deleted

## 2020-01-12 DIAGNOSIS — Z4659 Encounter for fitting and adjustment of other gastrointestinal appliance and device: Secondary | ICD-10-CM

## 2020-01-12 DIAGNOSIS — K861 Other chronic pancreatitis: Secondary | ICD-10-CM | POA: Diagnosis present

## 2020-01-12 LAB — GLUCOSE, CAPILLARY
Glucose-Capillary: 143 mg/dL — ABNORMAL HIGH (ref 70–99)
Glucose-Capillary: 152 mg/dL — ABNORMAL HIGH (ref 70–99)
Glucose-Capillary: 163 mg/dL — ABNORMAL HIGH (ref 70–99)
Glucose-Capillary: 169 mg/dL — ABNORMAL HIGH (ref 70–99)
Glucose-Capillary: 180 mg/dL — ABNORMAL HIGH (ref 70–99)
Glucose-Capillary: 186 mg/dL — ABNORMAL HIGH (ref 70–99)

## 2020-01-12 LAB — COMPREHENSIVE METABOLIC PANEL
ALT: 16 U/L (ref 0–44)
AST: 19 U/L (ref 15–41)
Albumin: 2 g/dL — ABNORMAL LOW (ref 3.5–5.0)
Alkaline Phosphatase: 101 U/L (ref 38–126)
Anion gap: 11 (ref 5–15)
BUN: 27 mg/dL — ABNORMAL HIGH (ref 8–23)
CO2: 23 mmol/L (ref 22–32)
Calcium: 8.6 mg/dL — ABNORMAL LOW (ref 8.9–10.3)
Chloride: 103 mmol/L (ref 98–111)
Creatinine, Ser: 1 mg/dL (ref 0.44–1.00)
GFR calc Af Amer: >60 mL/min (ref >60)
GFR calc non Af Amer: 55 mL/min — ABNORMAL LOW (ref >60)
Glucose, Bld: 204 mg/dL — ABNORMAL HIGH (ref 70–99)
Potassium: 4.1 mmol/L (ref 3.5–5.1)
Sodium: 137 mmol/L (ref 135–145)
Total Bilirubin: 0.5 mg/dL (ref 0.3–1.2)
Total Protein: 4.8 g/dL — ABNORMAL LOW (ref 6.5–8.1)

## 2020-01-12 LAB — CBC WITH DIFFERENTIAL/PLATELET
Abs Immature Granulocytes: 0.2 10*3/uL — ABNORMAL HIGH (ref 0.00–0.07)
Basophils Absolute: 0 10*3/uL (ref 0.0–0.1)
Basophils Relative: 0 %
Eosinophils Absolute: 0 10*3/uL (ref 0.0–0.5)
Eosinophils Relative: 0 %
HCT: 23.2 % — ABNORMAL LOW (ref 36.0–46.0)
Hemoglobin: 7.2 g/dL — ABNORMAL LOW (ref 12.0–15.0)
Lymphocytes Relative: 4 %
Lymphs Abs: 0.5 10*3/uL — ABNORMAL LOW (ref 0.7–4.0)
MCH: 26.8 pg (ref 26.0–34.0)
MCHC: 31 g/dL (ref 30.0–36.0)
MCV: 86.2 fL (ref 80.0–100.0)
Monocytes Absolute: 0.2 10*3/uL (ref 0.1–1.0)
Monocytes Relative: 2 %
Myelocytes: 2 %
Neutro Abs: 10.8 10*3/uL — ABNORMAL HIGH (ref 1.7–7.7)
Neutrophils Relative %: 92 %
Platelets: 274 10*3/uL (ref 150–400)
RBC: 2.69 MIL/uL — ABNORMAL LOW (ref 3.87–5.11)
RDW: 15.5 % (ref 11.5–15.5)
WBC: 11.7 10*3/uL — ABNORMAL HIGH (ref 4.0–10.5)
nRBC: 0 /100 WBC
nRBC: 0.3 % — ABNORMAL HIGH (ref 0.0–0.2)

## 2020-01-12 MED ORDER — VITAL AF 1.2 CAL PO LIQD
1000.0000 mL | ORAL | Status: DC
  Administered 2020-01-12 – 2020-01-13 (×2): 1000 mL
  Filled 2020-01-12 (×6): qty 1000

## 2020-01-12 MED ORDER — SODIUM CHLORIDE 0.9 % IV SOLN: 1.0000 g | Freq: Two times a day (BID) | INTRAVENOUS | Status: DC

## 2020-01-12 MED ORDER — SODIUM CHLORIDE 0.9 % IV SOLN: 8.0000 mg/h | INTRAVENOUS | Status: DC

## 2020-01-12 MED ORDER — DIPHENHYDRAMINE HCL 50 MG/ML IJ SOLN: 12.5000 mg | Freq: Four times a day (QID) | INTRAMUSCULAR | 0 refills | Status: DC | PRN

## 2020-01-12 MED ORDER — ONDANSETRON HCL 4 MG/2ML IJ SOLN: 4.0000 mg | Freq: Four times a day (QID) | INTRAMUSCULAR | 0 refills | Status: DC | PRN

## 2020-01-12 MED ORDER — METOPROLOL TARTRATE 5 MG/5ML IV SOLN: 5.0000 mg | Freq: Four times a day (QID) | INTRAVENOUS | Status: DC

## 2020-01-12 MED ORDER — NYSTATIN 100000 UNIT/GM EX CREA: TOPICAL_CREAM | Freq: Two times a day (BID) | CUTANEOUS | 0 refills | Status: DC

## 2020-01-12 MED ORDER — METOCLOPRAMIDE HCL 5 MG/ML IJ SOLN
10.0000 mg | Freq: Four times a day (QID) | INTRAMUSCULAR | Status: DC | PRN
  Administered 2020-01-12 – 2020-01-14 (×3): 10 mg via INTRAVENOUS
  Filled 2020-01-12 (×4): qty 2

## 2020-01-12 MED ORDER — ONDANSETRON HCL 4 MG PO TABS: 4.0000 mg | ORAL_TABLET | Freq: Four times a day (QID) | ORAL | 0 refills | Status: DC | PRN

## 2020-01-12 MED ORDER — LABETALOL HCL 5 MG/ML IV SOLN: 5.0000 mg | INTRAVENOUS | Status: DC | PRN

## 2020-01-12 MED ORDER — INSULIN ASPART 100 UNIT/ML ~~LOC~~ SOLN: 0.0000 [IU] | SUBCUTANEOUS | 11 refills | Status: DC

## 2020-01-12 MED ORDER — HYDROMORPHONE HCL 1 MG/ML IJ SOLN: 0.5000 mg | Freq: Four times a day (QID) | INTRAMUSCULAR | 0 refills | Status: DC

## 2020-01-12 MED ORDER — VITAL AF 1.2 CAL PO LIQD: 1000.0000 mL | ORAL | Status: DC

## 2020-01-12 MED ORDER — PROMETHAZINE HCL 25 MG/ML IJ SOLN: 12.5000 mg | Freq: Four times a day (QID) | INTRAMUSCULAR | 0 refills | Status: DC | PRN

## 2020-01-12 MED ORDER — HYDROMORPHONE HCL 1 MG/ML IJ SOLN: 0.2500 mg | INTRAMUSCULAR | 0 refills | Status: DC | PRN

## 2020-01-12 MED ORDER — NALOXONE HCL 0.4 MG/ML IJ SOLN: 0.4000 mg | INTRAMUSCULAR | Status: DC | PRN

## 2020-01-12 MED ORDER — METOCLOPRAMIDE HCL 5 MG/ML IJ SOLN: 10.0000 mg | Freq: Four times a day (QID) | INTRAMUSCULAR | 0 refills | Status: DC | PRN

## 2020-01-12 MED ORDER — INSULIN GLARGINE 100 UNIT/ML ~~LOC~~ SOLN: 10.0000 [IU] | Freq: Every day | SUBCUTANEOUS | 11 refills | Status: DC

## 2020-01-12 MED ORDER — FUROSEMIDE 10 MG/ML IJ SOLN
40.0000 mg | Freq: Once | INTRAMUSCULAR | Status: AC
  Administered 2020-01-12: 40 mg via INTRAVENOUS
  Filled 2020-01-12: qty 4

## 2020-01-12 NOTE — Discharge Summary (Addendum)
Physician Discharge Summary  Katherine Oliver KZS:[id number] DOB: May 09, 1946 DOA: 01/07/2020  PCP: [name], MD  Admit date: 01/07/2020 Discharge date: 01/15/2020  Admitted From: Home Disposition:  Transfer to [facility] pending bed availability Accepting physician: Dr. Devoria Glassing Case was discussed with Dr. Robin Searing by Dr. Ronnette Juniper (GI)    Discharge Condition:Stable CODE STATUS: Full Diet recommendation: Tube feeding via [employer]   Brief/Interim Summary: 74 year old female with history of diabetes, prior cholecystectomy in 2003, recent extensive hospitalization due to choledocholithiasis and necrotic pancreatitis, renal failure requiring hemodialysis who was discharged 2 days ago with planned outpatient follow-up. She was diagnosed with necrotizing pancreatitis, underwent ERCP and stenting, had protracted hospitalization. Continue to have poor appetite and abdominal pain so presented back to the [facility] hospital. In the emergency room, hemodynamically stable, repeat imaging studies showed increasing necrotic collection in the pancreas that replaced entire pancreas. Also showed intraperitoneal and retroperitoneal phlegmonous changes. Pt admitted for further management.  Discharge Diagnoses:  Principal Problem:   Acute necrotizing pancreatitis Active Problems:   HTN (hypertension)   AKI (acute kidney injury) (HCC)   AF (paroxysmal atrial fibrillation) (HCC)   Acute pancreatitis   Pancreatitis   Acute necrotizing pancreatitiswithpancreatic cyst formation andintraperitoneal and retroperitoneal rim enhancing lesions Currently afebrile LFTsremainstable, with low protein and albumin, suggesting malnutrition Due to significant poor nutrition, postpyloric tube placementwas done Tube feeding continued per GI, continues to tolerate thus far Concerns for large pancreatic fluid collection with plan for transfer to [facility] for eval by advanced endoscopist Plan for transfer to [facility] today  GI on board,discussed with Dr. Therisa Doyne on 01/11/2020. With acute hematemesis, have ordered CT abd/pelvis with IV contrast Dr. Horris Latino discussed with surgerywhosuggested no intervention. Dr. Rushie Chestnut discussed with interventional radiology, suggested no external drainage. Initially on PCA analgesia, now symptoms better controlled with scheduled dilaudid per Palliative Care recs Continued on empiricIV meropenem  AKI Labs reviewed, slowly improving Required dialysis brieflylast admission Had declined HD initially, on discussion with GI, pt had agreed to HD again only if needed Cr remains stable currently  Normocytic anemia/chronic diseasewith acute blood loss anemia Hemoglobin continues to be around baseline Large hematemesis noted the AM of 3/10. Discussed with GI. Concern forbleeding from stress ulcer seen on EGD one month prior Now on protonix gtt.  -Hgb currently 7.0, no obvious bleeding. Would transfuse for hgb <7  Type 2 diabetesmellitus Last A1c 6.4 on 12/14/2019 Noted hyperglycemia Now onLantus, SSI(adjust accordingly),continue withAccu-Cheks, hypoglycemic protocol Holdinghome Metformin while pt in hospital  Hypertension Remains stable currently Norvasccurrently remains on hold  Paroxysmal A. Fib Currently rate controlled on sinus rhythm Not on anticoagulation Currently off amiodarone as pt is NPO, continue scheduled IV metoprolol for rate control  GERD Continue PPIas tolerated  Obesity Lifestyle modification advised  Hypoxia Pt had earlier required 5LNC CT abd/pelvis reviewed, findings suggesting compressive atelectasis noted as well as some anasarca Given trial of IV lasix with improvement in O2 requirement Currently appears comfortable on [facility]  Goals of care Patient with very guarded prognosis Palliativehad been following. Full Code  Discharge  Instructions   Allergies as of 01/15/2020      Reactions   Azithromycin Itching   Okay if takes benadryl along with it   Nickel    Reaction to cheap earrings   Sulfa Antibiotics    Itching.    Vinegar [acetic Acid]    Nausea.    Adhesive [tape] Rash   Paper tape -  blisters      Medication List    STOP taking these medications   amiodarone 200 MG tablet Commonly known as: PACERONE   amLODipine 2.5 MG tablet Commonly known as: NORVASC   benazepril 10 MG tablet Commonly known as: LOTENSIN   diphenhydrAMINE 25 mg capsule Commonly known as: BENADRYL Replaced by: diphenhydrAMINE 50 MG/ML injection   fluticasone 50 MCG/ACT nasal spray Commonly known as: FLONASE   hydrochlorothiazide 12.5 MG tablet Commonly known as: HYDRODIURIL   HYDROcodone-acetaminophen 5-325 MG tablet Commonly known as: NORCO/VICODIN   loperamide 2 MG capsule Commonly known as: IMODIUM   metFORMIN 1000 MG (MOD) 24 hr tablet Commonly known as: GLUMETZA   multivitamin Tabs tablet   pantoprazole 40 MG tablet Commonly known as: PROTONIX Replaced by: pantoprazole 40 MG injection   rosuvastatin 10 MG tablet Commonly known as: CRESTOR   sucralfate 1 GM/10ML suspension Commonly known as: CARAFATE     TAKE these medications   diphenhydrAMINE 50 MG/ML injection Commonly known as: BENADRYL Inject 0.25 mLs (12.5 mg total) into the vein every 6 (six) hours as needed for itching. Replaces: diphenhydrAMINE 25 mg capsule   feeding supplement (VITAL AF 1.2 CAL) Liqd Place 1,000 mLs into feeding tube continuous. What changed:   how much to take  how to take this  when to take this   HYDROmorphone 1 MG/ML injection Commonly known as: DILAUDID Inject 0.25 mLs (0.25 mg total) into the vein every 2 (two) hours as needed for severe pain.   HYDROmorphone 1 MG/ML injection Commonly known as: DILAUDID Inject 0.5 mLs (0.5 mg total) into the vein every 6 (six) hours.   insulin aspart 100 UNIT/ML  injection Commonly known as: novoLOG Inject 0-15 Units into the skin every 4 (four) hours.   insulin glargine 100 UNIT/ML injection Commonly known as: LANTUS Inject 0.1 mLs (10 Units total) into the skin daily.   labetalol 5 MG/ML injection Commonly known as: NORMODYNE Inject 1 mL (5 mg total) into the vein every 2 (two) hours as needed (HR>120, hold sbp<100).   meropenem 1 g in sodium chloride 0.9 % 100 mL Inject 1 g into the vein every 12 (twelve) hours.   metoCLOPramide 5 MG/ML injection Commonly known as: REGLAN Inject 2 mLs (10 mg total) into the vein every 6 (six) hours as needed.   metoprolol tartrate 5 MG/5ML Soln injection Commonly known as: LOPRESSOR Inject 5 mLs (5 mg total) into the vein every 6 (six) hours.   naloxone 0.4 MG/ML injection Commonly known as: NARCAN Inject 1 mL (0.4 mg total) into the vein as needed (respiratory rate < 10).   nystatin cream Commonly known as: MYCOSTATIN Apply topically 2 (two) times daily.   ondansetron 4 MG tablet Commonly known as: ZOFRAN Take 1 tablet (4 mg total) by mouth every 6 (six) hours as needed for nausea. What changed: Another medication with the same name was added. Make sure you understand how and when to take each.   ondansetron 4 MG/2ML Soln injection Commonly known as: ZOFRAN Inject 2 mLs (4 mg total) into the vein every 6 (six) hours as needed for nausea. What changed: You were already taking a medication with the same name, and this prescription was added. Make sure you understand how and when to take each.   pantoprazole 40 MG injection Commonly known as: PROTONIX Inject 40 mg into the vein every 12 (twelve) hours. Replaces: pantoprazole 40 MG tablet   promethazine 25 MG/ML injection Commonly known as: PHENERGAN Inject 0.5  mLs (12.5 mg total) into the vein every 6 (six) hours as needed for nausea or vomiting.       Allergies  Allergen Reactions  . Azithromycin Itching    Okay if takes benadryl  along with it  . Nickel     Reaction to cheap earrings  . Sulfa Antibiotics     Itching.   [name] [Acetic Acid]     Nausea.   . Adhesive [Tape] Rash    Paper tape - blisters    Consultations:  GI  IR General Surgery  Palliative Care  Procedures/Studies: CT ABDOMEN PELVIS WO CONTRAST  Result Date: 01/06/2020 CLINICAL DATA:  Lower abdominal pain increasing over the past several days EXAM: CT ABDOMEN AND PELVIS WITHOUT CONTRAST TECHNIQUE: Multidetector CT imaging of the abdomen and pelvis was performed following the standard protocol without IV contrast. COMPARISON:  CT abdomen pelvis 12/13/2019 FINDINGS: Lower chest: Bilateral pleural effusions, right greater than left with adjacent passive atelectasis in the lung bases. Cardiac size is within normal limits. Extensive coronary artery calcifications noted. Calcifications also present on the aortic leaflets. Trace pericardial effusion is within physiologic normal. Hepatobiliary: There is pneumobilia within the anti dependent portions of the left lobe liver. Postsurgical changes from prior cholecystectomy. A common bile duct stent is in place, displaced to the right by the large pancreatic fluid collections detailed below. No gross intra or hepatic or extrahepatic ductal dilatation is seen. Distal tip of this biliary stent appears positioned within the duodenal lumen though the bowel is largely collapsed secondary to mass effect as well. Pancreas: An interval development of a large heterogeneous collection centered upon the pancreas, replacing much of the pancreatic parenchyma. Given configuration, difficult to accurately measure though there is a maximal AP thickness of up to 7 cm with medial-lateral extent of 16 cm and a craniocaudal extent of 8 cm (coronal 5/40). Several sites of higher attenuation material within this collection are worrisome for acute hemorrhage given the attenuation on this noncontrast study (5/36). Extensive severe intra  peritoneal and retroperitoneal phlegmonous changes are present as well as a separate intraperitoneal rim enhancing collection extending inferolaterally within the central mesentery measuring 5.6 x 2.5 x 3.1 cm (5/36, 6/47). Spleen: Normal in size without focal abnormality. Adrenals/Urinary Tract: Normal adrenal glands. Stable exophytic cystic lesion arising from the lower pole left kidney demonstrating increased attenuation on prior contrast enhanced study. No concerning renal masses, urolithiasis or hydronephrosis. Mild bilateral symmetric perinephric stranding, a nonspecific which could be reactive or correlate with diminished renal function, advanced patient age, or infection. Gas is noted within the urinary bladder, correlate for recent instrumentation. Stomach/Bowel: Distal esophagus is unremarkable. Some gastric wall thickening is likely reactive due to inflammation in the lesser sac. Lateral displacement and compression of the duodenum by the mass effect from the pancreatic collection detailed above. No other small bowel dilatation or wall thickening. The appendix is poorly visualized but without focal pericecal inflammation to suggest acute appendicitis. No colonic dilatation or wall thickening. Scattered colonic diverticula without focal pericolonic inflammation to suggest diverticulitis. Vascular/Lymphatic: Atherosclerotic plaque within the normal caliber aorta. Evaluation of the vasculature is limited in the absence of contrast media. Reactive adenopathy throughout the abdomen and pelvis. No pathologically enlarged nodes. Reproductive: Uterus is surgically absent. No concerning adnexal lesions. Other: Peripancreatic in mid abdominal fluid collections as detailed above. Small volume simple attenuation ascites noted in the perihepatic space and perisplenic spaces as well as layering in the deep pelvis. No free intraperitoneal air is  seen at this time. Musculoskeletal: Multilevel degenerative changes are  present in the imaged portions of the spine. No acute osseous abnormality or suspicious osseous lesion. IMPRESSION: 1. Interval development of a large heterogeneous collection centered upon the pancreas, replacing much of the pancreatic parenchyma. Given time course from initial onset pancreatitis symptoms (12/13/2019) findings are compatible with an acute necrotic collection as per the revised [facility] classification of acute pancreatitis. 2. Several sites of higher attenuation material within this collection are worrisome for acute hemorrhage given the attenuation on this noncontrast study consider correlation with CBC. 3. Extensive intra peritoneal and retroperitoneal phlegmonous changes are present as well as a separate intraperitoneal rim enhancing collection within the central mesentery measuring 5.6 x 2.5 x 3.1 cm. 4. Small volume simple attenuation ascites. No free intraperitoneal air is seen at this time. 5. Bilateral pleural effusions, right greater than left with adjacent passive atelectasis in the lung bases. 6. Gas within the urinary bladder, correlate for recent instrumentation. 7. Stable exophytic lesion arising from the lower pole left kidney. Previously demonstrating central enhancement on comparison contrast-enhanced study. Consider outpatient MRI when patient is better able to tolerate the study. 8. Aortic Atherosclerosis (ICD10-I70.0). These results were called by telephone at the time of interpretation on 01/06/2020 at 4:41 pm to provider [facility] , who verbally acknowledged these results. Electronically Signed   By: Lovena Le M.D.   On: 01/06/2020 16:41   DG Chest 2 View  Result Date: 01/06/2020 CLINICAL DATA:  Cough and dyspnea. EXAM: CHEST - 2 VIEW COMPARISON:  December 17, 2019 FINDINGS: The nasogastric tube, feeding tube and right internal jugular venous catheter seen on the prior study have been removed. Mild atelectasis and/or infiltrate is seen within the left lung base. There  is mild blunting of the bilateral costophrenic angles. No pneumothorax is identified. The heart size and mediastinal contours are within normal limits. Multilevel degenerative changes seen throughout the thoracic spine. Radiopaque surgical clips are seen overlying the right upper quadrant. IMPRESSION: 1. Mild atelectasis and/or infiltrate within the left lung base. 2. Small bilateral pleural effusions. Electronically Signed   By: Virgina Norfolk M.D.   On: 01/06/2020 21:17   CT ABDOMEN PELVIS W CONTRAST  Result Date: 01/11/2020 CLINICAL DATA:  Abdominal abscess, infection suspected, nausea, vomiting, abdominal pain EXAM: CT ABDOMEN AND PELVIS WITH CONTRAST TECHNIQUE: Multidetector CT imaging of the abdomen and pelvis was performed using the standard protocol following bolus administration of intravenous contrast. CONTRAST:  179mL OMNIPAQUE IOHEXOL 300 MG/ML  SOLN COMPARISON:  01/06/2020, 12/13/2019 FINDINGS: Lower chest: Small bilateral pleural effusions and associated atelectasis or consolidation, similar to prior examination. Hepatobiliary: No solid liver abnormality is seen. Status post cholecystectomy. Common bile duct stent remains in position. Pancreas: Redemonstrated large intermediate attenuation collection (HU = 34) replacing the majority of the pancreatic parenchyma, which is somewhat more organized appearing compared to prior examination and rim enhancing with administration of IV contrast. Overall dimensions measure at least 17.6 x 8.1 cm, slightly enlarged compared to prior examination. There is extensive, adjacent retroperitoneal and mesenteric inflammatory stranding. Spleen: Normal in size without significant abnormality. Adrenals/Urinary Tract: Adrenal glands are unremarkable. Kidneys are normal, without renal calculi, solid lesion, or hydronephrosis. Small volume of air in the urinary bladder. Correlate with recent catheterization. Stomach/Bowel: Stomach is within normal limits. Interval  placement of weighted enteric feeding tube, tip in the ascending duodenum. No evidence of bowel wall thickening, distention, or inflammatory changes originating from the bowel. Vascular/Lymphatic: Aortic atherosclerosis. No enlarged abdominal  or pelvic lymph nodes. Reproductive: Status post hysterectomy. Redemonstrated 2.4 cm fluid attenuation lesion of the right ovary, likely a cyst and unchanged compared to prior (series 3, image 74). Other: Anasarca increased compared to prior examination. Moderate volume 4 quadrant ascites, slightly increased in volume compared to prior examination. Musculoskeletal: No acute or significant osseous findings. IMPRESSION: 1. Redemonstrated large intermediate attenuation collection replacing the majority of the pancreatic parenchyma, which is somewhat more organized appearing compared to prior examination and rim enhancing with administration of IV contrast. Overall dimensions measure at least 17.6 x 8.1 cm, slightly enlarged compared to prior examination. Findings remain consistent with acute necrotic pancreatic collection. The presence or absence of infection is not established by CT. No new fluid collections are identified in the abdomen. There is extensive adjacent retroperitoneal and mesenteric inflammatory stranding. 2. Common bile duct stent remains in position. 3. Moderate volume four quadrant ascites, slightly increased in volume compared to prior examination. Anasarca increased compared to prior examination. 4. Small bilateral pleural effusions and associated atelectasis or consolidation, similar to prior examination. 5. Aortic atherosclerosis (ICD10-I70.0). Electronically Signed   By: Eddie Candle M.D.   On: 01/11/2020 15:21   IR Fluoro Guide CV Line Right  Result Date: 12/27/2019 CLINICAL DATA:  Renal failure and need for tunneled hemodialysis catheter. Current right jugular temporary non tunneled dialysis catheter in place. This is needed currently for IV access and  conscious sedation for the tunnel catheter placement. EXAM: TUNNELED CENTRAL VENOUS HEMODIALYSIS CATHETER PLACEMENT WITH ULTRASOUND AND FLUOROSCOPIC GUIDANCE ANESTHESIA/SEDATION: 1.0 mg IV Versed; 50 mcg IV Fentanyl. Total Moderate Sedation Time:   23 minutes. The patient's level of consciousness and physiologic status were continuously monitored during the procedure by Radiology nursing. MEDICATIONS: 2 g IV Ancef. FLUOROSCOPY TIME:  30 seconds.  3.0 mGy. PROCEDURE: The procedure, risks, benefits, and alternatives were explained to the patient. Questions regarding the procedure were encouraged and answered. The patient understands and consents to the procedure. A timeout was performed prior to initiating the procedure. Ultrasound was used to confirm patency of the left internal jugular vein. The left neck and chest were prepped with chlorhexidine in a sterile fashion, and a sterile drape was applied covering the operative field. Maximum barrier sterile technique with sterile gowns and gloves were used for the procedure. Local anesthesia was provided with 1% lidocaine. After creating a small venotomy incision, a 21 gauge needle was advanced into the left internal jugular vein under direct, real-time ultrasound guidance. Ultrasound image documentation was performed. After securing guidewire access, an 8 Fr dilator was placed. A J-wire was kinked to measure appropriate catheter length. A Palindrome tunneled hemodialysis catheter measuring 23 cm from tip to cuff was chosen for placement. This was tunneled in a retrograde fashion from the chest wall to the venotomy incision. At the venotomy, serial dilatation was performed and a 16 Fr peel-away sheath was placed over a guidewire. The catheter was then placed through the sheath and the sheath removed. Final catheter positioning was confirmed and documented with a fluoroscopic spot image. The catheter was aspirated, flushed with saline, and injected with appropriate volume  heparin dwells. The venotomy incision was closed with subcuticular 4-0 Vicryl. Dermabond was applied to the incision. The catheter exit site was secured with 0-Prolene retention sutures. COMPLICATIONS: None.  No pneumothorax. FINDINGS: After catheter placement, the tip lies in the right atrium. The catheter aspirates normally and is ready for immediate use. IMPRESSION: Placement of tunneled hemodialysis catheter via the left internal jugular vein.  The catheter tip lies in the right atrium. The catheter is ready for immediate use. Electronically Signed   By: Aletta Edouard M.D.   On: 12/27/2019 10:13   IR Removal Tun Cv Cath W/O FL  Result Date: 01/03/2020 INDICATION: Patient with AK I in the setting of sepsis since resolved presents for removal of HD catheter EXAM: REMOVAL TUNNELED CENTRAL VENOUS CATHETER MEDICATIONS: No antibiotic was administered prior to this procedure; ANESTHESIA/SEDATION: Moderate (conscious) sedation was not utilized performance of this procedure. FLUOROSCOPY TIME:  Fluoroscopy Time: None COMPLICATIONS: None immediate. PROCEDURE: Informed written consent was obtained from the patient after a thorough discussion of the procedural risks, benefits and alternatives. All questions were addressed. Maximal Sterile Barrier Technique was utilized including caps, mask, sterile gowns, sterile gloves, sterile drape, hand hygiene and skin antiseptic. A timeout was performed prior to the initiation of the procedure. The patient's left chest and catheter was prepped and draped in a normal sterile fashion. Heparin was removed from both ports of catheter. Using gentle blunt dissection the cuff of the catheter was exposed and the catheter was removed in it's entirety. Pressure was held till hemostasis was obtained. A sterile dressing was applied. The patient tolerated the procedure well with no immediate complications. IMPRESSION: Successful catheter removal as described above. Read by Rushie Nyhan NP  Electronically Signed   By: Corrie Mckusick D.O.   On: 01/03/2020 16:18   DG CHEST PORT 1 VIEW  Result Date: 12/17/2019 CLINICAL DATA:  Acute respiratory failure. EXAM: PORTABLE CHEST 1 VIEW COMPARISON:  12/16/2019 FINDINGS: Right IJ catheter tip is at the cavoatrial junction. Feeding tube and NG tube are identified. The tips are both well below the level of the GE junction. Heart size is normal. Unchanged bilateral pleural effusions. Mild bibasilar atelectasis. IMPRESSION: Stable bilateral pleural effusions with bibasilar atelectasis. Electronically Signed   By: Kerby Moors M.D.   On: 12/17/2019 07:42   DG Chest Port 1 View  Result Date: 12/16/2019 CLINICAL DATA:  Complication of central venous catheter EXAM: PORTABLE CHEST 1 VIEW COMPARISON:  12/15/2019, 12/16/2019 FINDINGS: Single frontal view of the chest demonstrates stable position of the right internal jugular central venous catheter overlying superior vena cava. There are 2 enteric catheters extending below the diaphragm, tips excluded by collimation. Cardiac silhouette is stable. Bibasilar veiling opacities, right greater than left, consistent with consolidation and effusion. No pneumothorax. IMPRESSION: 1. Support devices as above. 2. Stable bibasilar veiling opacities consistent with consolidation and effusions. Electronically Signed   By: Randa Ngo M.D.   On: 12/16/2019 20:33   DG Abd Portable 1V  Result Date: 01/09/2020 CLINICAL DATA:  Feeding tube placement. EXAM: PORTABLE ABDOMEN - 1 VIEW COMPARISON:  12/16/2019 FINDINGS: Enteric feeding tube tip projects at the level of the ligament of Treitz, well positioned. There is a biliary stent which extends from the second portion of the duodenum, based on the enteric tube placement, to just inferior and medially to cholecystectomy surgical clips. There is normal bowel gas pattern. IMPRESSION: 1. Well-positioned enteric tube Electronically Signed   By: Lajean Manes M.D.   On: 01/09/2020  12:25   DG Abd Portable 1V  Result Date: 12/16/2019 CLINICAL DATA:  Feeding tube placement EXAM: PORTABLE ABDOMEN - 1 VIEW COMPARISON:  None. FINDINGS: An NG tube terminates in the stomach. The feeding tube terminates near the ligament of Treitz in the left side of the abdomen. IMPRESSION: The feeding tube terminates near the ligament of Treitz in the left side of the  abdomen. The NG tube is in good position. Electronically Signed   By: Dorise Bullion III M.D   On: 12/16/2019 15:22   [place] EKG SITE RITE  Result Date: 01/11/2020 If Site Rite image not attached, placement could not be confirmed due to current cardiac rhythm.   Subjective: Eager to be transferred to [facility] - [place]  Discharge Exam: Vitals:   01/15/20 0728 01/15/20 1145  BP: (!) 151/69 (!) 153/76  Pulse: 96 98  Resp: 20   Temp:  97.6 F (36.4 C)  SpO2: 97% 98%   Vitals:   01/15/20 0416 01/15/20 0420 01/15/20 0728 01/15/20 1145  BP: (!) 152/71  (!) 151/69 (!) 153/76  Pulse: 77  96 98  Resp: 18  20   Temp: 98 F (36.7 C)   97.6 F (36.4 C)  TempSrc: Oral   Oral  SpO2: 98%  97% 98%  Weight:  92.1 kg    Height:        General: Pt is alert, awake, not in acute distress Cardiovascular: RRR, S1/S2 +, no rubs, no gallops Respiratory: CTA bilaterally, no wheezing, no rhonchi Abdominal: distended, decreased BS Extremities: no edema, no cyanosis   The results of significant diagnostics from this hospitalization (including imaging, microbiology, ancillary and laboratory) are listed below for reference.     Microbiology: Recent Results (from the past 240 hour(s))  Respiratory Panel by RT PCR (Flu A&B, Covid) - Nasopharyngeal Swab     Status: None   Collection Time: 01/06/20  5:16 PM   Specimen: Nasopharyngeal Swab  Result Value Ref Range Status   SARS Coronavirus 2 by RT PCR NEGATIVE NEGATIVE Final    Comment: (NOTE) SARS-CoV-2 target nucleic acids are NOT DETECTED. The SARS-CoV-2 RNA is generally detectable in upper  respiratoy specimens during the acute phase of infection. The lowest concentration of SARS-CoV-2 viral copies this assay can detect is 131 copies/mL. A negative result does not preclude SARS-Cov-2 infection and should not be used as the sole basis for treatment or other patient management decisions. A negative result may occur with  improper specimen collection/handling, submission of specimen other than nasopharyngeal swab, presence of viral mutation(s) within the areas targeted by this assay, and inadequate number of viral copies (<131 copies/mL). A negative result must be combined with clinical observations, patient history, and epidemiological information. The expected result is Negative. Fact Sheet for Patients:  PinkCheek.be Fact Sheet for Healthcare Providers:  GravelBags.it This test is not yet ap proved or cleared by the [place] FDA and  has been authorized for detection and/or diagnosis of SARS-CoV-2 by FDA under an Emergency Use Authorization (EUA). This EUA will remain  in effect (meaning this test can be used) for the duration of the COVID-19 declaration under Section 564(b)(1) of the Act, 21 U.S.C. section 360bbb-3(b)(1), unless the authorization is terminated or revoked sooner.    Influenza A by PCR NEGATIVE NEGATIVE Final   Influenza B by PCR NEGATIVE NEGATIVE Final    Comment: (NOTE) The Xpert Xpress SARS-CoV-2/FLU/RSV assay is intended as an aid in  the diagnosis of influenza from Nasopharyngeal swab specimens and  should not be used as a sole basis for treatment. Nasal washings and  aspirates are unacceptable for Xpert Xpress SARS-CoV-2/FLU/RSV  testing. Fact Sheet for Patients: PinkCheek.be Fact Sheet for Healthcare Providers: GravelBags.it This test is not yet approved or cleared by the [place] FDA and  has been authorized for  detection and/or diagnosis of SARS-CoV-2 by  FDA under an Emergency Use Authorization (  EUA). This EUA will remain  in effect (meaning this test can be used) for the duration of the  Covid-19 declaration under Section 564(b)(1) of the Act, 21  U.S.C. section 360bbb-3(b)(1), unless the authorization is  terminated or revoked. Performed at [facility], [address] [place], [place] [address]      Labs: BNP (last 3 results) Recent Labs    12/15/19 0900  BNP 39.7   Basic Metabolic Panel: Recent Labs  Lab 01/09/20 0202 01/09/20 0202 01/10/20 0234 01/10/20 0234 01/11/20 0440 01/12/20 0512 01/13/20 0422 01/14/20 0405 01/15/20 0430  NA 133*   < > 134*   < > 134* 137 141 135 134*  K 3.5   < > 3.9   < > 4.0 4.1 4.0 4.2 4.4  CL 102   < > 102   < > 101 103 103 102 102  CO2 22   < > 21*   < > 23 23 24  21* 24  GLUCOSE 192*   < > 329*   < > 308* 204* 163* 257* 247*  BUN 22   < > 21   < > 24* 27* 29* 31* 31*  CREATININE 1.50*   < > 1.46*   < > 1.35* 1.00 0.97 0.99 0.92  CALCIUM 8.5*   < > 8.6*   < > 8.8* 8.6* 9.2 8.7* 8.2*  MG 1.9  --  1.7  --   --   --   --   --   --   PHOS 2.3*  --  2.7  --   --   --   --   --   --    < > = values in this interval not displayed.   Liver Function Tests: Recent Labs  Lab 01/11/20 0440 01/12/20 0512 01/13/20 0422 01/14/20 0405 01/15/20 0430  AST 25 19 51* 25 25  ALT 17 16 32 29 23  ALKPHOS 134* 101 146* 138* 136*  BILITOT 0.5 0.5 0.7 0.4 0.5  PROT 5.5* 4.8* 5.3* 5.3* 4.6*  ALBUMIN 2.2* 2.0* 2.1* 2.2* 1.8*   No results for input(s): LIPASE, AMYLASE in the last 168 hours. No results for input(s): AMMONIA in the last 168 hours. CBC: Recent Labs  Lab 01/11/20 0440 01/11/20 0955 01/12/20 0512 01/13/20 0422 01/14/20 0405 01/15/20 0430 01/15/20 1010  WBC 11.2*  --  11.7* 13.0* 14.0* 9.8  --   NEUTROABS 9.3*  --  10.8* 9.3* 10.7* 7.3  --   HGB 7.6*   < > 7.2* 7.5* 7.2* 6.9* 7.0*  HCT 24.4*   < > 23.2* 24.6* 23.6* 22.1*  22.8*  MCV 86.5  --  86.2 88.2 87.4 85.7  --   PLT 293  --  274 282 233 196  --    < > = values in this interval not displayed.   Cardiac Enzymes: No results for input(s): CKTOTAL, CKMB, CKMBINDEX, TROPONINI in the last 168 hours. BNP: Invalid input(s): POCBNP CBG: Recent Labs  Lab 01/14/20 1950 01/14/20 2332 01/15/20 0415 01/15/20 0821 01/15/20 1123  GLUCAP 280* 202* 231* 262* 219*   D-Dimer No results for input(s): DDIMER in the last 72 hours. Hgb A1c No results for input(s): HGBA1C in the last 72 hours. Lipid Profile No results for input(s): CHOL, HDL, LDLCALC, TRIG, CHOLHDL, LDLDIRECT in the last 72 hours. Thyroid function studies No results for input(s): TSH, T4TOTAL, T3FREE, THYROIDAB in the last 72 hours.  Invalid input(s): FREET3 Anemia work up No results for input(s):  VITAMINB12, FOLATE, FERRITIN, TIBC, IRON, RETICCTPCT in the last 72 hours. Urinalysis    Component Value Date/Time   COLORURINE YELLOW (A) 01/06/2020 1747   APPEARANCEUR CLEAR (A) 01/06/2020 1747   APPEARANCEUR Cloudy (A) 12/12/2019 1332   LABSPEC 1.014 01/06/2020 1747   PHURINE 5.0 01/06/2020 1747   GLUCOSEU 150 (A) 01/06/2020 1747   HGBUR NEGATIVE 01/06/2020 1747   BILIRUBINUR NEGATIVE 01/06/2020 1747   BILIRUBINUR Negative 12/12/2019 1332   KETONESUR NEGATIVE 01/06/2020 1747   PROTEINUR NEGATIVE 01/06/2020 1747   UROBILINOGEN 0.2 02/12/2018 0805   NITRITE NEGATIVE 01/06/2020 1747   LEUKOCYTESUR NEGATIVE 01/06/2020 1747   Sepsis Labs Invalid input(s): PROCALCITONIN,  WBC,  LACTICIDVEN Microbiology Recent Results (from the past 240 hour(s))  Respiratory Panel by RT PCR (Flu A&B, Covid) - Nasopharyngeal Swab     Status: None   Collection Time: 01/06/20  5:16 PM   Specimen: Nasopharyngeal Swab  Result Value Ref Range Status   SARS Coronavirus 2 by RT PCR NEGATIVE NEGATIVE Final    Comment: (NOTE) SARS-CoV-2 target nucleic acids are NOT DETECTED. The SARS-CoV-2 RNA is generally  detectable in upper respiratoy specimens during the acute phase of infection. The lowest concentration of SARS-CoV-2 viral copies this assay can detect is 131 copies/mL. A negative result does not preclude SARS-Cov-2 infection and should not be used as the sole basis for treatment or other patient management decisions. A negative result may occur with  improper specimen collection/handling, submission of specimen other than nasopharyngeal swab, presence of viral mutation(s) within the areas targeted by this assay, and inadequate number of viral copies (<131 copies/mL). A negative result must be combined with clinical observations, patient history, and epidemiological information. The expected result is Negative. Fact Sheet for Patients:  PinkCheek.be Fact Sheet for Healthcare Providers:  GravelBags.it This test is not yet ap proved or cleared by the [place] FDA and  has been authorized for detection and/or diagnosis of SARS-CoV-2 by FDA under an Emergency Use Authorization (EUA). This EUA will remain  in effect (meaning this test can be used) for the duration of the COVID-19 declaration under Section 564(b)(1) of the Act, 21 U.S.C. section 360bbb-3(b)(1), unless the authorization is terminated or revoked sooner.    Influenza A by PCR NEGATIVE NEGATIVE Final   Influenza B by PCR NEGATIVE NEGATIVE Final    Comment: (NOTE) The Xpert Xpress SARS-CoV-2/FLU/RSV assay is intended as an aid in  the diagnosis of influenza from Nasopharyngeal swab specimens and  should not be used as a sole basis for treatment. Nasal washings and  aspirates are unacceptable for Xpert Xpress SARS-CoV-2/FLU/RSV  testing. Fact Sheet for Patients: PinkCheek.be Fact Sheet for Healthcare Providers: GravelBags.it This test is not yet approved or cleared by the [place] FDA and  has been  authorized for detection and/or diagnosis of SARS-CoV-2 by  FDA under an Emergency Use Authorization (EUA). This EUA will remain  in effect (meaning this test can be used) for the duration of the  Covid-19 declaration under Section 564(b)(1) of the Act, 21  U.S.C. section 360bbb-3(b)(1), unless the authorization is  terminated or revoked. Performed at [facility], [address] [place], [place] [address]    Time spent: 30 min  SIGNED:   Marylu Lund, MD  [facility] 01/15/2020, 2:06 PM  If 7PM-7AM, please contact night-coverage

## 2020-01-12 NOTE — Progress Notes (Signed)
PT Cancellation Note  Patient Details Name: Calista Crain MRN: [id number] DOB: [birth year]   Cancelled Treatment:    Reason Eval/Treat Not Completed: Other (comment) Pt declined due to nausea and malaise. Impending transfer to [facility]. After discussion with MD, PT signing off.     Wyona Almas, PT, DPT Acute Rehabilitation Services Pager [phone] Office [phone]    Deno Etienne 01/12/2020, 11:00 AM

## 2020-01-12 NOTE — Progress Notes (Signed)
OT Cancellation Note  Patient Details Name: Katherine Oliver MRN: [id number] DOB: May 10, 1946   Cancelled Treatment:    Reason Eval/Treat Not Completed: Other (comment)(Pt declined and reports still not feeling well. Per RN pt will be transferring to [facility] for further assessment. OT will follow up as appropriate.   Darleen Crocker P MS, OTR/L 01/12/2020, 10:18 AM

## 2020-01-12 NOTE — Progress Notes (Signed)
Subjective: The patient was seen and examined at bedside. Nausea and vomiting have subsided since last evening. She reports loose diarrheal-like stools, 2 episodes yesterday. She is now on oxygen via nasal cannula, 4 L/min. She feels more comfortable than yesterday although she continues to have abdominal pain.  Objective: Vital signs in last 24 hours: Temp:  [97.9 F (36.6 C)-99.7 F (37.6 C)] 97.9 F (36.6 C) (03/11 0745) Pulse Rate:  [95-113] 95 (03/11 0745) Resp:  [16-20] 17 (03/11 0745) BP: (142-166)/(65-75) 148/73 (03/11 0745) SpO2:  [93 %-100 %] 96 % (03/11 0745) Weight:  [88.6 kg] 88.6 kg (03/11 0500) Weight change: 1.4 kg Last BM Date: 01/10/20  PE: Overweight, in mild distress, able to speak in full sentences, not using accessory muscles of respiration GENERAL: Prominent pallor, no icterus ABDOMEN: Distended, mild generalized tenderness, more tenderness in the left upper quadrant and left side of the abdomen, normoactive bowel sounds, no rigidity or guarding EXTREMITIES: No deformity  Lab Results: Results for orders placed or performed during the hospital encounter of 01/07/20 (from the past 48 hour(s))  Glucose, capillary     Status: Abnormal   Collection Time: 01/10/20  8:38 AM  Result Value Ref Range   Glucose-Capillary 284 (H) 70 - 99 mg/dL    Comment: Glucose reference range applies only to samples taken after fasting for at least 8 hours.  Glucose, capillary     Status: Abnormal   Collection Time: 01/10/20 11:55 AM  Result Value Ref Range   Glucose-Capillary 291 (H) 70 - 99 mg/dL    Comment: Glucose reference range applies only to samples taken after fasting for at least 8 hours.   Comment 1 Notify RN    Comment 2 Document in Chart   Glucose, capillary     Status: Abnormal   Collection Time: 01/10/20  3:59 PM  Result Value Ref Range   Glucose-Capillary 308 (H) 70 - 99 mg/dL    Comment: Glucose reference range applies only to samples taken after fasting for  at least 8 hours.  Glucose, capillary     Status: Abnormal   Collection Time: 01/10/20  8:20 PM  Result Value Ref Range   Glucose-Capillary 299 (H) 70 - 99 mg/dL    Comment: Glucose reference range applies only to samples taken after fasting for at least 8 hours.  Glucose, capillary     Status: Abnormal   Collection Time: 01/11/20 12:32 AM  Result Value Ref Range   Glucose-Capillary 312 (H) 70 - 99 mg/dL    Comment: Glucose reference range applies only to samples taken after fasting for at least 8 hours.  Type and screen [place]     Status: None   Collection Time: 01/11/20  4:35 AM  Result Value Ref Range   ABO/RH(D) B POS    Antibody Screen NEG    Sample Expiration      01/14/2020,2359 Performed at [facility], [place] [address], [place], [place] [address]   CBC with Differential/Platelet     Status: Abnormal   Collection Time: 01/11/20  4:40 AM  Result Value Ref Range   WBC 11.2 (H) 4.0 - 10.5 K/uL   RBC 2.82 (L) 3.87 - 5.11 MIL/uL   Hemoglobin 7.6 (L) 12.0 - 15.0 g/dL   HCT 24.4 (L) 36.0 - 46.0 %   MCV 86.5 80.0 - 100.0 fL   MCH 27.0 26.0 - 34.0 pg   MCHC 31.1 30.0 - 36.0 g/dL   RDW 15.4 11.5 -  15.5 %   Platelets 293 150 - 400 K/uL   nRBC 0.3 (H) 0.0 - 0.2 %   Neutrophils Relative % 83 %   Neutro Abs 9.3 (H) 1.7 - 7.7 K/uL   Lymphocytes Relative 5 %   Lymphs Abs 0.6 (L) 0.7 - 4.0 K/uL   Monocytes Relative 10 %   Monocytes Absolute 1.1 (H) 0.1 - 1.0 K/uL   Eosinophils Relative 0 %   Eosinophils Absolute 0.0 0.0 - 0.5 K/uL   Basophils Relative 2 %   Basophils Absolute 0.2 (H) 0.0 - 0.1 K/uL   nRBC 0 0 /100 WBC   Abs Immature Granulocytes 0.00 0.00 - 0.07 K/uL   Polychromasia PRESENT     Comment: Performed at [facility], [address], [place], [place] [address]  Comprehensive metabolic panel     Status: Abnormal   Collection Time: 01/11/20  4:40 AM  Result Value Ref Range   Sodium 134 (L) 135 - 145 mmol/L   Potassium 4.0 3.5 - 5.1  mmol/L   Chloride 101 98 - 111 mmol/L   CO2 23 22 - 32 mmol/L   Glucose, Bld 308 (H) 70 - 99 mg/dL    Comment: Glucose reference range applies only to samples taken after fasting for at least 8 hours.   BUN 24 (H) 8 - 23 mg/dL   Creatinine, Ser 1.35 (H) 0.44 - 1.00 mg/dL   Calcium 8.8 (L) 8.9 - 10.3 mg/dL   Total Protein 5.5 (L) 6.5 - 8.1 g/dL   Albumin 2.2 (L) 3.5 - 5.0 g/dL   AST 25 15 - 41 U/L   ALT 17 0 - 44 U/L   Alkaline Phosphatase 134 (H) 38 - 126 U/L   Total Bilirubin 0.5 0.3 - 1.2 mg/dL   GFR calc non Af Amer 38 (L) >60 mL/min   GFR calc Af Amer 44 (L) >60 mL/min   Anion gap 10 5 - 15    Comment: Performed at [facility] [address], [place], [place] [address]  Vitamin B12     Status: Abnormal   Collection Time: 01/11/20  4:40 AM  Result Value Ref Range   Vitamin B-12 998 (H) 180 - 914 pg/mL    Comment: (NOTE) This assay is not validated for testing neonatal or myeloproliferative syndrome specimens for Vitamin B12 levels. Performed at [facility], [place] [address], [place], [place] [address]   Folate     Status: None   Collection Time: 01/11/20  4:40 AM  Result Value Ref Range   Folate 15.1 >5.9 ng/mL    Comment: Performed at [facility] [address], [place], [place] [address]  Iron and TIBC     Status: Abnormal   Collection Time: 01/11/20  4:40 AM  Result Value Ref Range   Iron 18 (L) 28 - 170 ug/dL   TIBC 172 (L) 250 - 450 ug/dL   Saturation Ratios 10 (L) 10.4 - 31.8 %   UIBC 154 ug/dL    Comment: Performed at [facility], [place] [address] [place], [place] [address]  Ferritin     Status: None   Collection Time: 01/11/20  4:40 AM  Result Value Ref Range   Ferritin 271 11 - 307 ng/mL    Comment: Performed at [facility], [place] [address], [place], [place] [address]  Glucose, capillary     Status: Abnormal   Collection Time: 01/11/20  4:45 AM  Result Value Ref Range   Glucose-Capillary 310 (H) 70 - 99 mg/dL    Comment:  Glucose reference range applies only to samples taken after fasting for at least 8 hours.  Glucose, capillary     Status: Abnormal   Collection Time: 01/11/20  8:33 AM  Result Value Ref Range   Glucose-Capillary 293 (H) 70 - 99 mg/dL    Comment: Glucose reference range applies only to samples taken after fasting for at least 8 hours.   Comment 1 Notify RN    Comment 2 Document in Chart   Hemoglobin and hematocrit, blood     Status: Abnormal   Collection Time: 01/11/20  9:55 AM  Result Value Ref Range   Hemoglobin 7.9 (L) 12.0 - 15.0 g/dL   HCT 25.3 (L) 36.0 - 46.0 %    Comment: Performed at [facility] [address] [place], [place] [address]  Glucose, capillary     Status: Abnormal   Collection Time: 01/11/20 11:57 AM  Result Value Ref Range   Glucose-Capillary 250 (H) 70 - 99 mg/dL    Comment: Glucose reference range applies only to samples taken after fasting for at least 8 hours.   Comment 1 Notify RN    Comment 2 Document in Chart   Glucose, capillary     Status: Abnormal   Collection Time: 01/11/20  4:32 PM  Result Value Ref Range   Glucose-Capillary 205 (H) 70 - 99 mg/dL    Comment: Glucose reference range applies only to samples taken after fasting for at least 8 hours.  Hemoglobin and hematocrit, blood     Status: Abnormal   Collection Time: 01/11/20  4:43 PM  Result Value Ref Range   Hemoglobin 7.5 (L) 12.0 - 15.0 g/dL   HCT 23.8 (L) 36.0 - 46.0 %    Comment: Performed at [facility], [place] [address], [place], [place] [address]  Glucose, capillary     Status: Abnormal   Collection Time: 01/11/20  8:47 PM  Result Value Ref Range   Glucose-Capillary 176 (H) 70 - 99 mg/dL    Comment: Glucose reference range applies only to samples taken after fasting for at least 8 hours.  Glucose, capillary     Status: Abnormal   Collection Time: 01/12/20 12:03 AM  Result Value Ref Range   Glucose-Capillary 186 (H) 70 - 99 mg/dL    Comment: Glucose reference range  applies only to samples taken after fasting for at least 8 hours.  Glucose, capillary     Status: Abnormal   Collection Time: 01/12/20  3:44 AM  Result Value Ref Range   Glucose-Capillary 180 (H) 70 - 99 mg/dL    Comment: Glucose reference range applies only to samples taken after fasting for at least 8 hours.  CBC with Differential/Platelet     Status: Abnormal (Preliminary result)   Collection Time: 01/12/20  5:12 AM  Result Value Ref Range   WBC 11.7 (H) 4.0 - 10.5 K/uL   RBC 2.69 (L) 3.87 - 5.11 MIL/uL   Hemoglobin 7.2 (L) 12.0 - 15.0 g/dL   HCT 23.2 (L) 36.0 - 46.0 %   MCV 86.2 80.0 - 100.0 fL   MCH 26.8 26.0 - 34.0 pg   MCHC 31.0 30.0 - 36.0 g/dL   RDW 15.5 11.5 - 15.5 %   Platelets 274 150 - 400 K/uL   nRBC 0.3 (H) 0.0 - 0.2 %    Comment: Performed at [facility],  [address] [place], [place] [address]   Neutrophils Relative % PENDING %   Neutro Abs PENDING 1.7 - 7.7 K/uL   Band Neutrophils PENDING %   Lymphocytes Relative PENDING %   Lymphs Abs PENDING 0.7 - 4.0 K/uL   Monocytes Relative PENDING %   Monocytes Absolute PENDING 0.1 - 1.0 K/uL   Eosinophils Relative PENDING %   Eosinophils Absolute PENDING 0.0 - 0.5 K/uL   Basophils Relative PENDING %   Basophils Absolute PENDING 0.0 - 0.1 K/uL   WBC Morphology PENDING    RBC Morphology PENDING    Smear Review PENDING    Other PENDING %   nRBC PENDING 0 /100 WBC   Metamyelocytes Relative PENDING %   Myelocytes PENDING %   Promyelocytes Relative PENDING %   Blasts PENDING %   Immature Granulocytes PENDING %   Abs Immature Granulocytes PENDING 0.00 - 0.07 K/uL  Comprehensive metabolic panel     Status: Abnormal   Collection Time: 01/12/20  5:12 AM  Result Value Ref Range   Sodium 137 135 - 145 mmol/L   Potassium 4.1 3.5 - 5.1 mmol/L   Chloride 103 98 - 111 mmol/L   CO2 23 22 - 32 mmol/L   Glucose, Bld 204 (H) 70 - 99 mg/dL    Comment: Glucose reference range applies only to samples taken after fasting  for at least 8 hours.   BUN 27 (H) 8 - 23 mg/dL   Creatinine, Ser 1.00 0.44 - 1.00 mg/dL   Calcium 8.6 (L) 8.9 - 10.3 mg/dL   Total Protein 4.8 (L) 6.5 - 8.1 g/dL   Albumin 2.0 (L) 3.5 - 5.0 g/dL   AST 19 15 - 41 U/L   ALT 16 0 - 44 U/L   Alkaline Phosphatase 101 38 - 126 U/L   Total Bilirubin 0.5 0.3 - 1.2 mg/dL   GFR calc non Af Amer 55 (L) >60 mL/min   GFR calc Af Amer >60 >60 mL/min   Anion gap 11 5 - 15    Comment: Performed at [facility] [address], [place], [place] [address]  Glucose, capillary     Status: Abnormal   Collection Time: 01/12/20  7:47 AM  Result Value Ref Range   Glucose-Capillary 169 (H) 70 - 99 mg/dL    Comment: Glucose reference range applies only to samples taken after fasting for at least 8 hours.   Comment 1 Notify RN    Comment 2 Document in Chart     Studies/Results: CT ABDOMEN PELVIS W CONTRAST  Result Date: 01/11/2020 CLINICAL DATA:  Abdominal abscess, infection suspected, nausea, vomiting, abdominal pain EXAM: CT ABDOMEN AND PELVIS WITH CONTRAST TECHNIQUE: Multidetector CT imaging of the abdomen and pelvis was performed using the standard protocol following bolus administration of intravenous contrast. CONTRAST:  144mL OMNIPAQUE IOHEXOL 300 MG/ML  SOLN COMPARISON:  01/06/2020, 12/13/2019 FINDINGS: Lower chest: Small bilateral pleural effusions and associated atelectasis or consolidation, similar to prior examination. Hepatobiliary: No solid liver abnormality is seen. Status post cholecystectomy. Common bile duct stent remains in position. Pancreas: Redemonstrated large intermediate attenuation collection (HU = 34) replacing the majority of the pancreatic parenchyma, which is somewhat more organized appearing compared to prior examination and rim enhancing with administration of IV contrast. Overall dimensions measure at least 17.6 x 8.1 cm, slightly enlarged compared to prior examination. There is extensive, adjacent retroperitoneal and  mesenteric inflammatory stranding. Spleen: Normal in size without significant abnormality. Adrenals/Urinary Tract: Adrenal glands  are unremarkable. Kidneys are normal, without renal calculi, solid lesion, or hydronephrosis. Small volume of air in the urinary bladder. Correlate with recent catheterization. Stomach/Bowel: Stomach is within normal limits. Interval placement of weighted enteric feeding tube, tip in the ascending duodenum. No evidence of bowel wall thickening, distention, or inflammatory changes originating from the bowel. Vascular/Lymphatic: Aortic atherosclerosis. No enlarged abdominal or pelvic lymph nodes. Reproductive: Status post hysterectomy. Redemonstrated 2.4 cm fluid attenuation lesion of the right ovary, likely a cyst and unchanged compared to prior (series 3, image 74). Other: Anasarca increased compared to prior examination. Moderate volume 4 quadrant ascites, slightly increased in volume compared to prior examination. Musculoskeletal: No acute or significant osseous findings. IMPRESSION: 1. Redemonstrated large intermediate attenuation collection replacing the majority of the pancreatic parenchyma, which is somewhat more organized appearing compared to prior examination and rim enhancing with administration of IV contrast. Overall dimensions measure at least 17.6 x 8.1 cm, slightly enlarged compared to prior examination. Findings remain consistent with acute necrotic pancreatic collection. The presence or absence of infection is not established by CT. No new fluid collections are identified in the abdomen. There is extensive adjacent retroperitoneal and mesenteric inflammatory stranding. 2. Common bile duct stent remains in position. 3. Moderate volume four quadrant ascites, slightly increased in volume compared to prior examination. Anasarca increased compared to prior examination. 4. Small bilateral pleural effusions and associated atelectasis or consolidation, similar to prior  examination. 5. Aortic atherosclerosis (ICD10-I70.0). Electronically Signed   By: Eddie Candle M.D.   On: 01/11/2020 15:21   [place] EKG SITE RITE  Result Date: 01/11/2020 If Site Rite image not attached, placement could not be confirmed due to current cardiac rhythm.   Medications: I have reviewed the patient's current medications.  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 I have discussed her case in details with Dr. Robin Searing from [facility] endoscopy service at [facility] who is willing to accept the patient for transfer and for the procedure. I have spoken to Dr.Chiu from [facility] to initiate transfer of patient, number to call is [phone].  Meanwhile, recommend restarting nasojejunal tube feedings at a lower rate, 90ml/hr and to advance to goal rate if patient has no further evidence of nausea or vomiting. Otherwise, except meds, no oral feeding. Continue IV fluids, lactated Ringer's at 75 mL/h. Continue IV Merrem 1 g every 12 hours She has received Reglan 10 mg every 6 hours for dose, recommend Reglan 10 mg IV every 8 hours as needed. Continue IV Protonix drip for a total of 72 hours and then switch to Protonix 40 mg IV twice daily. Prognosis guarded.  Ronnette Juniper, MD 01/12/2020, 7:55 AM

## 2020-01-13 LAB — CBC WITH DIFFERENTIAL/PLATELET
Abs Immature Granulocytes: 0.72 10*3/uL — ABNORMAL HIGH (ref 0.00–0.07)
Basophils Absolute: 0.1 10*3/uL (ref 0.0–0.1)
Basophils Relative: 0 %
Eosinophils Absolute: 0 10*3/uL (ref 0.0–0.5)
Eosinophils Relative: 0 %
HCT: 24.6 % — ABNORMAL LOW (ref 36.0–46.0)
Hemoglobin: 7.5 g/dL — ABNORMAL LOW (ref 12.0–15.0)
Immature Granulocytes: 6 %
Lymphocytes Relative: 9 %
Lymphs Abs: 1.2 10*3/uL (ref 0.7–4.0)
MCH: 26.9 pg (ref 26.0–34.0)
MCHC: 30.5 g/dL (ref 30.0–36.0)
MCV: 88.2 fL (ref 80.0–100.0)
Monocytes Absolute: 1.7 10*3/uL — ABNORMAL HIGH (ref 0.1–1.0)
Monocytes Relative: 13 %
Neutro Abs: 9.3 10*3/uL — ABNORMAL HIGH (ref 1.7–7.7)
Neutrophils Relative %: 72 %
Platelets: 282 10*3/uL (ref 150–400)
RBC: 2.79 MIL/uL — ABNORMAL LOW (ref 3.87–5.11)
RDW: 15.9 % — ABNORMAL HIGH (ref 11.5–15.5)
WBC: 13 10*3/uL — ABNORMAL HIGH (ref 4.0–10.5)
nRBC: 0.4 % — ABNORMAL HIGH (ref 0.0–0.2)

## 2020-01-13 LAB — COMPREHENSIVE METABOLIC PANEL
ALT: 32 U/L (ref 0–44)
AST: 51 U/L — ABNORMAL HIGH (ref 15–41)
Albumin: 2.1 g/dL — ABNORMAL LOW (ref 3.5–5.0)
Alkaline Phosphatase: 146 U/L — ABNORMAL HIGH (ref 38–126)
Anion gap: 14 (ref 5–15)
BUN: 29 mg/dL — ABNORMAL HIGH (ref 8–23)
CO2: 24 mmol/L (ref 22–32)
Calcium: 9.2 mg/dL (ref 8.9–10.3)
Chloride: 103 mmol/L (ref 98–111)
Creatinine, Ser: 0.97 mg/dL (ref 0.44–1.00)
GFR calc Af Amer: >60 mL/min (ref >60)
GFR calc non Af Amer: 57 mL/min — ABNORMAL LOW (ref >60)
Glucose, Bld: 163 mg/dL — ABNORMAL HIGH (ref 70–99)
Potassium: 4 mmol/L (ref 3.5–5.1)
Sodium: 141 mmol/L (ref 135–145)
Total Bilirubin: 0.7 mg/dL (ref 0.3–1.2)
Total Protein: 5.3 g/dL — ABNORMAL LOW (ref 6.5–8.1)

## 2020-01-13 LAB — GLUCOSE, CAPILLARY
Glucose-Capillary: 164 mg/dL — ABNORMAL HIGH (ref 70–99)
Glucose-Capillary: 167 mg/dL — ABNORMAL HIGH (ref 70–99)
Glucose-Capillary: 188 mg/dL — ABNORMAL HIGH (ref 70–99)
Glucose-Capillary: 199 mg/dL — ABNORMAL HIGH (ref 70–99)
Glucose-Capillary: 213 mg/dL — ABNORMAL HIGH (ref 70–99)
Glucose-Capillary: 218 mg/dL — ABNORMAL HIGH (ref 70–99)
Glucose-Capillary: 236 mg/dL — ABNORMAL HIGH (ref 70–99)

## 2020-01-13 MED ORDER — SODIUM CHLORIDE 0.9 % IV SOLN
1.0000 g | Freq: Three times a day (TID) | INTRAVENOUS | Status: DC
  Administered 2020-01-13 – 2020-01-15 (×7): 1 g via INTRAVENOUS
  Filled 2020-01-13 (×9): qty 1

## 2020-01-13 NOTE — Progress Notes (Signed)
MEWS 2 due to RR 22 and HR 108. Provider notified.

## 2020-01-13 NOTE — Progress Notes (Signed)
PROGRESS NOTE    Katherine Oliver  PXT:[id number] DOB: 01/14/1946 DOA: 01/07/2020 PCP: [name], MD    Brief Narrative:  74 year old female with history of diabetes, prior cholecystectomy in 2003, recent extensive hospitalization due to choledocholithiasis and necrotic pancreatitis, renal failure requiring hemodialysis who was discharged 2 days ago with planned outpatient follow-up.  She was diagnosed with necrotizing pancreatitis, underwent ERCP and stenting, had protracted hospitalization.  Continue to have poor appetite and abdominal pain so presented back to the [facility] hospital. In the emergency room, hemodynamically stable, repeat imaging studies showed increasing necrotic collection in the pancreas that replaced entire pancreas.  Also showed intraperitoneal and retroperitoneal phlegmonous changes. Pt admitted for further management.    Assessment & Plan:   Principal Problem:   Acute necrotizing pancreatitis Active Problems:   HTN (hypertension)   AKI (acute kidney injury) (HCC)   AF (paroxysmal atrial fibrillation) (HCC)   Acute pancreatitis   Pancreatitis  Acute necrotizing pancreatitiswithpancreatic cyst formation andintraperitoneal and retroperitoneal rim enhancing lesions Currently afebrile LFTsremainstable, with low protein and albumin, suggesting malnutrition Due to significant poor nutrition, postpyloric tube placementwas done Tube feeding continued per GI, continues to tolerate thus far Concerns for large pancreatic fluid collection with plan for transfer to [facility] for eval by advanced endoscopist Awaiting bed availability at [facility] on board,discussed with Dr. Therisa Doyne on 01/11/2020. With acute hematemesis, have ordered CT abd/pelvis with IV contrast Dr. Horris Latino discussed with surgerywhosuggested no intervention. Dr. Rushie Chestnut discussed with interventional radiology, suggested no external drainage.  Initially  on PCA analgesia, now symptoms better controlled with scheduled dilaudid per Palliative Care recs Continued on empiricIV meropenem  AKI Labs reviewed, slowly improving Required dialysis brieflylast admission Had declined HD initially, on discussion with GI, pt had agreed to HD again only if needed Cr remains stable at this time  Normocytic anemia/chronic diseasewith acute blood loss anemia Hemoglobin continues to be around baseline Large hematemesis noted the AM of 3/10. Discussed with GI. Concern for bleeding from stress ulcer seen on EGD one month prior Now on protonix gtt. Keep NPO, increase IVF rate Serial hgb has remained stable thus far. No further bleeding noted   Type 2 diabetesmellitus Last A1c 6.4 on 12/14/2019 Noted hyperglycemia Now onLantus, SSI(adjust accordingly),continue withAccu-Cheks, hypoglycemic protocol Holdinghome Metformin while pt remains in hospital  Hypertension Remains stable at this time Norvasc presently remains on hold  Paroxysmal A. Fib Currently rate controlled on sinus rhythm Not on anticoagulation Now off amiodarone as pt is NPO, continue scheduled IV metoprolol for rate control  GERD Continue PPIas tolerated  Obesity Lifestyle modification advised  Hypoxia Pt currently with 5LNC, appears quite comfortable and pt denies feeling sob CT abd/pelvis reviewed, findings suggesting compressive atelectasis noted as well as some anasarca Given trial of IV lasix Now weaned to room air   Goals of care Patient with very guarded prognosis Palliative had been following. Full Code  DVT prophylaxis: SCD's Code Status: Full Family Communication: Pt in room, family not at bedside Disposition Plan: From home, plan transfer to [facility] when bed available  Consultants:   GI  IR General Surgery  Palliative Care  Procedures:     Antimicrobials: Anti-infectives (From admission, onward)   Start     Dose/Rate Route  Frequency Ordered Stop   01/13/20 1600  meropenem (MERREM) 1 g in sodium chloride 0.9 % 100 mL IVPB     1 g 200 mL/hr over 30 Minutes Intravenous Every 8 hours  01/13/20 1102     01/12/20 0000  meropenem 1 g in sodium chloride 0.9 % 100 mL     1 g Intravenous Every 12 hours 01/12/20 1548     01/10/20 1115  meropenem (MERREM) 1 g in sodium chloride 0.9 % 100 mL IVPB     1 g 200 mL/hr over 30 Minutes Intravenous Every 12 hours 01/10/20 1007 01/13/20 1153   01/07/20 1800  fluconazole (DIFLUCAN) tablet 150 mg     150 mg Oral  Once 01/07/20 1610 01/07/20 1757   01/07/20 0200  meropenem (MERREM) 500 mg in sodium chloride 0.9 % 100 mL IVPB  Status:  Discontinued     500 mg 200 mL/hr over 30 Minutes Intravenous Every 12 hours 01/07/20 0145 01/09/20 1345      Subjective: Eager for transfer to [facility]  Objective: Vitals:   01/13/20 0030 01/13/20 0411 01/13/20 1316 01/13/20 1542  BP: (!) 129/59 (!) 149/66 (!) 148/75 (!) 156/86  Pulse: (!) 103 98 97 (!) 102  Resp: 18 16 (!) 24 20  Temp: (!) 97.4 F (36.3 C) (!) 97.2 F (36.2 C) 98 F (36.7 C) 97.6 F (36.4 C)  TempSrc: Oral Oral Oral Oral  SpO2: 96% 95% 97% 97%  Weight:      Height:        Intake/Output Summary (Last 24 hours) at 01/13/2020 1551 Last data filed at 01/13/2020 6761 Gross per 24 hour  Intake 2025.41 ml  Output -  Net 2025.41 ml   Filed Weights   01/09/20 0532 01/11/20 0031 01/12/20 0500  Weight: 82.3 kg 87.2 kg 88.6 kg    Examination: General exam: Awake, laying in bed, in nad Respiratory system: Normal respiratory effort, no wheezing Cardiovascular system: regular rate, s1, s2 Gastrointestinal system: distended, decreased BS Central nervous system: CN2-12 grossly intact, strength intact Extremities: Perfused, no clubbing Skin: Normal skin turgor, no notable skin lesions seen Psychiatry: Mood normal // no visual hallucinations   Data Reviewed: I have personally reviewed following labs and imaging studies  CBC:  Recent Labs  Lab 01/09/20 0202 01/09/20 0202 01/10/20 0234 01/10/20 0234 01/11/20 0440 01/11/20 0955 01/11/20 1643 01/12/20 0512 01/13/20 0422  WBC 5.7  --  9.4  --  11.2*  --   --  11.7* 13.0*  NEUTROABS 4.0  --  6.6  --  9.3*  --   --  10.8* 9.3*  HGB 7.9*   < > 7.9*   < > 7.6* 7.9* 7.5* 7.2* 7.5*  HCT 25.3*   < > 25.0*   < > 24.4* 25.3* 23.8* 23.2* 24.6*  MCV 88.5  --  85.6  --  86.5  --   --  86.2 88.2  PLT 197  --  246  --  293  --   --  274 282   < > = values in this interval not displayed.   Basic Metabolic Panel: Recent Labs  Lab 01/08/20 0235 01/08/20 0235 01/09/20 0202 01/10/20 0234 01/11/20 0440 01/12/20 0512 01/13/20 0422  NA 137   < > 133* 134* 134* 137 141  K 3.9   < > 3.5 3.9 4.0 4.1 4.0  CL 105   < > 102 102 101 103 103  CO2 23   < > 22 21* 23 23 24   GLUCOSE 190*   < > 192* 329* 308* 204* 163*  BUN 23   < > 22 21 24* 27* 29*  CREATININE 1.68*   < > 1.50* 1.46* 1.35* 1.00  0.97  CALCIUM 8.3*   < > 8.5* 8.6* 8.8* 8.6* 9.2  MG 1.1*  --  1.9 1.7  --   --   --   PHOS 3.2  --  2.3* 2.7  --   --   --    < > = values in this interval not displayed.   GFR: Estimated Creatinine Clearance: 50.7 mL/min (by C-G formula based on SCr of 0.97 mg/dL). Liver Function Tests: Recent Labs  Lab 01/09/20 0202 01/10/20 0234 01/11/20 0440 01/12/20 0512 01/13/20 0422  AST 15 15 25 19  51*  ALT 12 13 17 16  32  ALKPHOS 115 116 134* 101 146*  BILITOT 0.8 0.3 0.5 0.5 0.7  PROT 5.2* 5.5* 5.5* 4.8* 5.3*  ALBUMIN 2.2* 2.2* 2.2* 2.0* 2.1*   No results for input(s): LIPASE, AMYLASE in the last 168 hours. No results for input(s): AMMONIA in the last 168 hours. Coagulation Profile: No results for input(s): INR, PROTIME in the last 168 hours. Cardiac Enzymes: No results for input(s): CKTOTAL, CKMB, CKMBINDEX, TROPONINI in the last 168 hours. BNP (last 3 results) No results for input(s): PROBNP in the last 8760 hours. HbA1C: No results for input(s): HGBA1C in the last 72  hours. CBG: Recent Labs  Lab 01/12/20 2035 01/13/20 0027 01/13/20 0415 01/13/20 0830 01/13/20 1252  GLUCAP 152* 167* 164* 188* 236*   Lipid Profile: No results for input(s): CHOL, HDL, LDLCALC, TRIG, CHOLHDL, LDLDIRECT in the last 72 hours. Thyroid Function Tests: No results for input(s): TSH, T4TOTAL, FREET4, T3FREE, THYROIDAB in the last 72 hours. Anemia Panel: Recent Labs    01/11/20 0440  VITAMINB12 998*  FOLATE 15.1  FERRITIN 271  TIBC 172*  IRON 18*   Sepsis Labs: No results for input(s): PROCALCITON, LATICACIDVEN in the last 168 hours.  Recent Results (from the past 240 hour(s))  Respiratory Panel by RT PCR (Flu A&B, Covid) - Nasopharyngeal Swab     Status: None   Collection Time: 01/06/20  5:16 PM   Specimen: Nasopharyngeal Swab  Result Value Ref Range Status   SARS Coronavirus 2 by RT PCR NEGATIVE NEGATIVE Final    Comment: (NOTE) SARS-CoV-2 target nucleic acids are NOT DETECTED. The SARS-CoV-2 RNA is generally detectable in upper respiratoy specimens during the acute phase of infection. The lowest concentration of SARS-CoV-2 viral copies this assay can detect is 131 copies/mL. A negative result does not preclude SARS-Cov-2 infection and should not be used as the sole basis for treatment or other patient management decisions. A negative result may occur with  improper specimen collection/handling, submission of specimen other than nasopharyngeal swab, presence of viral mutation(s) within the areas targeted by this assay, and inadequate number of viral copies (<131 copies/mL). A negative result must be combined with clinical observations, patient history, and epidemiological information. The expected result is Negative. Fact Sheet for Patients:  PinkCheek.be Fact Sheet for Healthcare Providers:  GravelBags.it This test is not yet ap proved or cleared by the [place] FDA and  has been authorized  for detection and/or diagnosis of SARS-CoV-2 by FDA under an Emergency Use Authorization (EUA). This EUA will remain  in effect (meaning this test can be used) for the duration of the COVID-19 declaration under Section 564(b)(1) of the Act, 21 U.S.C. section 360bbb-3(b)(1), unless the authorization is terminated or revoked sooner.    Influenza A by PCR NEGATIVE NEGATIVE Final   Influenza B by PCR NEGATIVE NEGATIVE Final    Comment: (NOTE) The Xpert Xpress SARS-CoV-2/FLU/RSV assay is  intended as an aid in  the diagnosis of influenza from Nasopharyngeal swab specimens and  should not be used as a sole basis for treatment. Nasal washings and  aspirates are unacceptable for Xpert Xpress SARS-CoV-2/FLU/RSV  testing. Fact Sheet for Patients: PinkCheek.be Fact Sheet for Healthcare Providers: GravelBags.it This test is not yet approved or cleared by the [place] FDA and  has been authorized for detection and/or diagnosis of SARS-CoV-2 by  FDA under an Emergency Use Authorization (EUA). This EUA will remain  in effect (meaning this test can be used) for the duration of the  Covid-19 declaration under Section 564(b)(1) of the Act, 21  U.S.C. section 360bbb-3(b)(1), unless the authorization is  terminated or revoked. Performed at [facility], [address] [place], [place] [address]      Radiology Studies: No results found.  Scheduled Meds: .  HYDROmorphone (DILAUDID) injection  0.5 mg Intravenous Q6H  . insulin aspart  0-15 Units Subcutaneous Q4H  . insulin glargine  10 Units Subcutaneous Daily  . metoprolol tartrate  5 mg Intravenous Q6H  . nystatin cream   Topical BID   Continuous Infusions: . feeding supplement (VITAL AF 1.2 CAL) 1,000 mL (01/12/20 1236)  . lactated ringers 50 mL/hr at 01/12/20 1754  . meropenem (MERREM) IV 1 g (01/13/20 1516)  . pantoprozole (PROTONIX) infusion 8 mg/hr (01/12/20 1755)      LOS: 6 days   Marylu Lund, MD [facility] Pager On Amion  If 7PM-7AM, please contact night-coverage 01/13/2020, 3:51 PM

## 2020-01-13 NOTE — Progress Notes (Signed)
Pharmacy Antibiotic Note  Katherine Oliver is a 73 y.o. female admitted on 01/07/2020 with Intra-Abdominal pain.  Patient with severe necrotizing pancreatitis. She was receiving Merrem since 3/5 which was discontinued per MD on 01/09/20.  Today 01/10/20 pharmacy consulted to restart Merrem for intra-abd infection.   The patient's renal function has improved warranting a Meropenem dose adjustment - will increase to q8h dosing. Noted plans to transfer to [facility] when a bed becomes available.    Plan: - Adjust Meropenem to 1g IV every 8 hours - Will continue to follow renal function, culture results, LOT, and antibiotic de-escalation plans   Height: 5\' 1"  (154.9 cm) Weight: 195 lb 5.2 oz (88.6 kg) IBW/kg (Calculated) : 47.8  Temp (24hrs), Avg:97.9 F (36.6 C), Min:97.2 F (36.2 C), Max:99.2 F (37.3 C)  Recent Labs  Lab 01/09/20 0202 01/10/20 0234 01/11/20 0440 01/12/20 0512 01/13/20 0422  WBC 5.7 9.4 11.2* 11.7* 13.0*  CREATININE 1.50* 1.46* 1.35* 1.00 0.97    Estimated Creatinine Clearance: 50.7 mL/min (by C-G formula based on SCr of 0.97 mg/dL).    Allergies  Allergen Reactions  . Azithromycin Itching    Okay if takes benadryl along with it  . Nickel     Reaction to cheap earrings  . Sulfa Antibiotics     Itching.   Katherine Oliver [Acetic Acid]     Nausea.   . Adhesive [Tape] Rash    Paper tape - blisters    Antimicrobials this admission: Cefepime 3/5 x 1 Metronidazole 3/5 x 1 Meropenem 3/5 >>3/8; restart 3/9>>  Dose adjustments this admission:  Microbiology results: 3/5 Covid: neg, influenza a/b neg  Thank you for allowing pharmacy to be a part of this patient's care.  Alycia Rossetti, PharmD, BCPS Clinical Pharmacist Clinical phone for 01/13/2020: [phone] 01/13/2020 11:04 AM   **Pharmacist phone directory can now be found on [email or web address] ([email or web address]).  Listed under [place].

## 2020-01-13 NOTE — Progress Notes (Signed)
Subjective: Patient has been restarted on nasojejunal feeds and is tolerating it without further episodes of nausea and vomiting. She has not had a bowel movement for over 24 hours but states she has been passing flatus. She continues to have abdominal pain which is fairly controlled with the use of narcotics. She wants some ice chips but does not want any diet advancement by mouth.  Objective: Vital signs in last 24 hours: Temp:  [97.2 F (36.2 C)-99.2 F (37.3 C)] 97.2 F (36.2 C) (03/12 0411) Pulse Rate:  [98-103] 98 (03/12 0411) Resp:  [16-22] 16 (03/12 0411) BP: (129-155)/(59-76) 149/66 (03/12 0411) SpO2:  [93 %-96 %] 95 % (03/12 0411) Weight change:  Last BM Date: 01/11/20  PE: Lying on bed, not in discomfort GENERAL: Mild pallor, no icterus ABDOMEN: Distended, generalized tenderness, normal active bowel sounds EXTREMITIES: No deformity  Lab Results: Results for orders placed or performed during the hospital encounter of 01/07/20 (from the past 48 hour(s))  Hemoglobin and hematocrit, blood     Status: Abnormal   Collection Time: 01/11/20  9:55 AM  Result Value Ref Range   Hemoglobin 7.9 (L) 12.0 - 15.0 g/dL   HCT 25.3 (L) 36.0 - 46.0 %    Comment: Performed at [facility], [address], [place], [place] [address]  Glucose, capillary     Status: Abnormal   Collection Time: 01/11/20 11:57 AM  Result Value Ref Range   Glucose-Capillary 250 (H) 70 - 99 mg/dL    Comment: Glucose reference range applies only to samples taken after fasting for at least 8 hours.   Comment 1 Notify RN    Comment 2 Document in Chart   Glucose, capillary     Status: Abnormal   Collection Time: 01/11/20  4:32 PM  Result Value Ref Range   Glucose-Capillary 205 (H) 70 - 99 mg/dL    Comment: Glucose reference range applies only to samples taken after fasting for at least 8 hours.  Hemoglobin and hematocrit, blood     Status: Abnormal   Collection Time: 01/11/20  4:43 PM  Result Value Ref  Range   Hemoglobin 7.5 (L) 12.0 - 15.0 g/dL   HCT 23.8 (L) 36.0 - 46.0 %    Comment: Performed at [facility], [place] [address], [place], [place] [address]  Glucose, capillary     Status: Abnormal   Collection Time: 01/11/20  8:47 PM  Result Value Ref Range   Glucose-Capillary 176 (H) 70 - 99 mg/dL    Comment: Glucose reference range applies only to samples taken after fasting for at least 8 hours.  Glucose, capillary     Status: Abnormal   Collection Time: 01/12/20 12:03 AM  Result Value Ref Range   Glucose-Capillary 186 (H) 70 - 99 mg/dL    Comment: Glucose reference range applies only to samples taken after fasting for at least 8 hours.  Glucose, capillary     Status: Abnormal   Collection Time: 01/12/20  3:44 AM  Result Value Ref Range   Glucose-Capillary 180 (H) 70 - 99 mg/dL    Comment: Glucose reference range applies only to samples taken after fasting for at least 8 hours.  CBC with Differential/Platelet     Status: Abnormal   Collection Time: 01/12/20  5:12 AM  Result Value Ref Range   WBC 11.7 (H) 4.0 - 10.5 K/uL   RBC 2.69 (L) 3.87 - 5.11 MIL/uL   Hemoglobin 7.2 (L) 12.0 - 15.0 g/dL  HCT 23.2 (L) 36.0 - 46.0 %   MCV 86.2 80.0 - 100.0 fL   MCH 26.8 26.0 - 34.0 pg   MCHC 31.0 30.0 - 36.0 g/dL   RDW 15.5 11.5 - 15.5 %   Platelets 274 150 - 400 K/uL   nRBC 0.3 (H) 0.0 - 0.2 %   Neutrophils Relative % 92 %   Neutro Abs 10.8 (H) 1.7 - 7.7 K/uL   Lymphocytes Relative 4 %   Lymphs Abs 0.5 (L) 0.7 - 4.0 K/uL   Monocytes Relative 2 %   Monocytes Absolute 0.2 0.1 - 1.0 K/uL   Eosinophils Relative 0 %   Eosinophils Absolute 0.0 0.0 - 0.5 K/uL   Basophils Relative 0 %   Basophils Absolute 0.0 0.0 - 0.1 K/uL   WBC Morphology See Note     Comment: Mild Left Shift. 1 to 5% Metas and Myelos, Occ Pro Noted.   nRBC 0 0 /100 WBC   Myelocytes 2 %   Abs Immature Granulocytes 0.20 (H) 0.00 - 0.07 K/uL   Polychromasia PRESENT     Comment: Performed at [facility],  [place] [address], [place], [place] [address]  Comprehensive metabolic panel     Status: Abnormal   Collection Time: 01/12/20  5:12 AM  Result Value Ref Range   Sodium 137 135 - 145 mmol/L   Potassium 4.1 3.5 - 5.1 mmol/L   Chloride 103 98 - 111 mmol/L   CO2 23 22 - 32 mmol/L   Glucose, Bld 204 (H) 70 - 99 mg/dL    Comment: Glucose reference range applies only to samples taken after fasting for at least 8 hours.   BUN 27 (H) 8 - 23 mg/dL   Creatinine, Ser 1.00 0.44 - 1.00 mg/dL   Calcium 8.6 (L) 8.9 - 10.3 mg/dL   Total Protein 4.8 (L) 6.5 - 8.1 g/dL   Albumin 2.0 (L) 3.5 - 5.0 g/dL   AST 19 15 - 41 U/L   ALT 16 0 - 44 U/L   Alkaline Phosphatase 101 38 - 126 U/L   Total Bilirubin 0.5 0.3 - 1.2 mg/dL   GFR calc non Af Amer 55 (L) >60 mL/min   GFR calc Af Amer >60 >60 mL/min   Anion gap 11 5 - 15    Comment: Performed at [facility] [address] [place], [place] [address]  Glucose, capillary     Status: Abnormal   Collection Time: 01/12/20  7:47 AM  Result Value Ref Range   Glucose-Capillary 169 (H) 70 - 99 mg/dL    Comment: Glucose reference range applies only to samples taken after fasting for at least 8 hours.   Comment 1 Notify RN    Comment 2 Document in Chart   Glucose, capillary     Status: Abnormal   Collection Time: 01/12/20 11:25 AM  Result Value Ref Range   Glucose-Capillary 163 (H) 70 - 99 mg/dL    Comment: Glucose reference range applies only to samples taken after fasting for at least 8 hours.  Glucose, capillary     Status: Abnormal   Collection Time: 01/12/20  3:51 PM  Result Value Ref Range   Glucose-Capillary 143 (H) 70 - 99 mg/dL    Comment: Glucose reference range applies only to samples taken after fasting for at least 8 hours.   Comment 1 Notify RN    Comment 2 Document in Chart   Glucose, capillary     Status: Abnormal  Collection Time: 01/12/20  8:35 PM  Result Value Ref Range   Glucose-Capillary 152 (H) 70 - 99 mg/dL    Comment: Glucose  reference range applies only to samples taken after fasting for at least 8 hours.  Glucose, capillary     Status: Abnormal   Collection Time: 01/13/20 12:27 AM  Result Value Ref Range   Glucose-Capillary 167 (H) 70 - 99 mg/dL    Comment: Glucose reference range applies only to samples taken after fasting for at least 8 hours.  Glucose, capillary     Status: Abnormal   Collection Time: 01/13/20  4:15 AM  Result Value Ref Range   Glucose-Capillary 164 (H) 70 - 99 mg/dL    Comment: Glucose reference range applies only to samples taken after fasting for at least 8 hours.  CBC with Differential/Platelet     Status: Abnormal   Collection Time: 01/13/20  4:22 AM  Result Value Ref Range   WBC 13.0 (H) 4.0 - 10.5 K/uL   RBC 2.79 (L) 3.87 - 5.11 MIL/uL   Hemoglobin 7.5 (L) 12.0 - 15.0 g/dL   HCT 24.6 (L) 36.0 - 46.0 %   MCV 88.2 80.0 - 100.0 fL   MCH 26.9 26.0 - 34.0 pg   MCHC 30.5 30.0 - 36.0 g/dL   RDW 15.9 (H) 11.5 - 15.5 %   Platelets 282 150 - 400 K/uL   nRBC 0.4 (H) 0.0 - 0.2 %   Neutrophils Relative % 72 %   Neutro Abs 9.3 (H) 1.7 - 7.7 K/uL   Lymphocytes Relative 9 %   Lymphs Abs 1.2 0.7 - 4.0 K/uL   Monocytes Relative 13 %   Monocytes Absolute 1.7 (H) 0.1 - 1.0 K/uL   Eosinophils Relative 0 %   Eosinophils Absolute 0.0 0.0 - 0.5 K/uL   Basophils Relative 0 %   Basophils Absolute 0.1 0.0 - 0.1 K/uL   WBC Morphology MILD LEFT SHIFT (1-5% METAS, OCC MYELO, OCC BANDS)    Immature Granulocytes 6 %   Abs Immature Granulocytes 0.72 (H) 0.00 - 0.07 K/uL    Comment: Performed at [facility], [address], [place], [place] [address]  Comprehensive metabolic panel     Status: Abnormal   Collection Time: 01/13/20  4:22 AM  Result Value Ref Range   Sodium 141 135 - 145 mmol/L   Potassium 4.0 3.5 - 5.1 mmol/L   Chloride 103 98 - 111 mmol/L   CO2 24 22 - 32 mmol/L   Glucose, Bld 163 (H) 70 - 99 mg/dL    Comment: Glucose reference range applies only to samples taken after  fasting for at least 8 hours.   BUN 29 (H) 8 - 23 mg/dL   Creatinine, Ser 0.97 0.44 - 1.00 mg/dL   Calcium 9.2 8.9 - 10.3 mg/dL   Total Protein 5.3 (L) 6.5 - 8.1 g/dL   Albumin 2.1 (L) 3.5 - 5.0 g/dL   AST 51 (H) 15 - 41 U/L   ALT 32 0 - 44 U/L   Alkaline Phosphatase 146 (H) 38 - 126 U/L   Total Bilirubin 0.7 0.3 - 1.2 mg/dL   GFR calc non Af Amer 57 (L) >60 mL/min   GFR calc Af Amer >60 >60 mL/min   Anion gap 14 5 - 15    Comment: Performed at [facility] [address], [place], [place] [address]  Glucose, capillary     Status: Abnormal   Collection Time: 01/13/20  8:30 AM  Result Value Ref Range   Glucose-Capillary 188 (H) 70 - 99 mg/dL    Comment: Glucose reference range applies only to samples taken after fasting for at least 8 hours.   Comment 1 Notify RN    Comment 2 Document in Chart     Studies/Results: CT ABDOMEN PELVIS W CONTRAST  Result Date: 01/11/2020 CLINICAL DATA:  Abdominal abscess, infection suspected, nausea, vomiting, abdominal pain EXAM: CT ABDOMEN AND PELVIS WITH CONTRAST TECHNIQUE: Multidetector CT imaging of the abdomen and pelvis was performed using the standard protocol following bolus administration of intravenous contrast. CONTRAST:  155mL OMNIPAQUE IOHEXOL 300 MG/ML  SOLN COMPARISON:  01/06/2020, 12/13/2019 FINDINGS: Lower chest: Small bilateral pleural effusions and associated atelectasis or consolidation, similar to prior examination. Hepatobiliary: No solid liver abnormality is seen. Status post cholecystectomy. Common bile duct stent remains in position. Pancreas: Redemonstrated large intermediate attenuation collection (HU = 34) replacing the majority of the pancreatic parenchyma, which is somewhat more organized appearing compared to prior examination and rim enhancing with administration of IV contrast. Overall dimensions measure at least 17.6 x 8.1 cm, slightly enlarged compared to prior examination. There is extensive, adjacent retroperitoneal  and mesenteric inflammatory stranding. Spleen: Normal in size without significant abnormality. Adrenals/Urinary Tract: Adrenal glands are unremarkable. Kidneys are normal, without renal calculi, solid lesion, or hydronephrosis. Small volume of air in the urinary bladder. Correlate with recent catheterization. Stomach/Bowel: Stomach is within normal limits. Interval placement of weighted enteric feeding tube, tip in the ascending duodenum. No evidence of bowel wall thickening, distention, or inflammatory changes originating from the bowel. Vascular/Lymphatic: Aortic atherosclerosis. No enlarged abdominal or pelvic lymph nodes. Reproductive: Status post hysterectomy. Redemonstrated 2.4 cm fluid attenuation lesion of the right ovary, likely a cyst and unchanged compared to prior (series 3, image 74). Other: Anasarca increased compared to prior examination. Moderate volume 4 quadrant ascites, slightly increased in volume compared to prior examination. Musculoskeletal: No acute or significant osseous findings. IMPRESSION: 1. Redemonstrated large intermediate attenuation collection replacing the majority of the pancreatic parenchyma, which is somewhat more organized appearing compared to prior examination and rim enhancing with administration of IV contrast. Overall dimensions measure at least 17.6 x 8.1 cm, slightly enlarged compared to prior examination. Findings remain consistent with acute necrotic pancreatic collection. The presence or absence of infection is not established by CT. No new fluid collections are identified in the abdomen. There is extensive adjacent retroperitoneal and mesenteric inflammatory stranding. 2. Common bile duct stent remains in position. 3. Moderate volume four quadrant ascites, slightly increased in volume compared to prior examination. Anasarca increased compared to prior examination. 4. Small bilateral pleural effusions and associated atelectasis or consolidation, similar to prior  examination. 5. Aortic atherosclerosis (ICD10-I70.0). Electronically Signed   By: Eddie Candle M.D.   On: 01/11/2020 15:21   [place] EKG SITE RITE  Result Date: 01/11/2020 If Site Rite image not attached, placement could not be confirmed due to current cardiac rhythm.   Medications: I have reviewed the patient's current medications.  Assessment: Acute pancreatitis with a large pancreatic collection measuring 7.6 x 8.1 cm compatible with acute necrotic pancreatic collection along with extensive retroperitoneal and mesenteric inflammatory stranding  Plan: Patient waiting to be transferred to [facility] for cystogastrostomy. Meanwhile she will be supported with nasojejunal feeding, adequate pain control, IV fluid now at 50 mL/h(has bilateral pleural effusion and needed IV Lasix yesterday), IV Merrem and Reglan IV as needed. Prognosis guarded.  Ronnette Juniper, MD 01/13/2020, 8:51 AM

## 2020-01-14 LAB — CBC WITH DIFFERENTIAL/PLATELET
Abs Immature Granulocytes: 0.66 10*3/uL — ABNORMAL HIGH (ref 0.00–0.07)
Basophils Absolute: 0 10*3/uL (ref 0.0–0.1)
Basophils Relative: 0 %
Eosinophils Absolute: 0 10*3/uL (ref 0.0–0.5)
Eosinophils Relative: 0 %
HCT: 23.6 % — ABNORMAL LOW (ref 36.0–46.0)
Hemoglobin: 7.2 g/dL — ABNORMAL LOW (ref 12.0–15.0)
Immature Granulocytes: 5 %
Lymphocytes Relative: 9 %
Lymphs Abs: 1.2 10*3/uL (ref 0.7–4.0)
MCH: 26.7 pg (ref 26.0–34.0)
MCHC: 30.5 g/dL (ref 30.0–36.0)
MCV: 87.4 fL (ref 80.0–100.0)
Monocytes Absolute: 1.4 10*3/uL — ABNORMAL HIGH (ref 0.1–1.0)
Monocytes Relative: 10 %
Neutro Abs: 10.7 10*3/uL — ABNORMAL HIGH (ref 1.7–7.7)
Neutrophils Relative %: 76 %
Platelets: 233 10*3/uL (ref 150–400)
RBC: 2.7 MIL/uL — ABNORMAL LOW (ref 3.87–5.11)
RDW: 16.1 % — ABNORMAL HIGH (ref 11.5–15.5)
WBC: 14 10*3/uL — ABNORMAL HIGH (ref 4.0–10.5)
nRBC: 0.3 % — ABNORMAL HIGH (ref 0.0–0.2)

## 2020-01-14 LAB — COMPREHENSIVE METABOLIC PANEL
ALT: 29 U/L (ref 0–44)
AST: 25 U/L (ref 15–41)
Albumin: 2.2 g/dL — ABNORMAL LOW (ref 3.5–5.0)
Alkaline Phosphatase: 138 U/L — ABNORMAL HIGH (ref 38–126)
Anion gap: 12 (ref 5–15)
BUN: 31 mg/dL — ABNORMAL HIGH (ref 8–23)
CO2: 21 mmol/L — ABNORMAL LOW (ref 22–32)
Calcium: 8.7 mg/dL — ABNORMAL LOW (ref 8.9–10.3)
Chloride: 102 mmol/L (ref 98–111)
Creatinine, Ser: 0.99 mg/dL (ref 0.44–1.00)
GFR calc Af Amer: >60 mL/min (ref >60)
GFR calc non Af Amer: 56 mL/min — ABNORMAL LOW (ref >60)
Glucose, Bld: 257 mg/dL — ABNORMAL HIGH (ref 70–99)
Potassium: 4.2 mmol/L (ref 3.5–5.1)
Sodium: 135 mmol/L (ref 135–145)
Total Bilirubin: 0.4 mg/dL (ref 0.3–1.2)
Total Protein: 5.3 g/dL — ABNORMAL LOW (ref 6.5–8.1)

## 2020-01-14 LAB — GLUCOSE, CAPILLARY
Glucose-Capillary: 202 mg/dL — ABNORMAL HIGH (ref 70–99)
Glucose-Capillary: 242 mg/dL — ABNORMAL HIGH (ref 70–99)
Glucose-Capillary: 248 mg/dL — ABNORMAL HIGH (ref 70–99)
Glucose-Capillary: 252 mg/dL — ABNORMAL HIGH (ref 70–99)
Glucose-Capillary: 255 mg/dL — ABNORMAL HIGH (ref 70–99)
Glucose-Capillary: 280 mg/dL — ABNORMAL HIGH (ref 70–99)

## 2020-01-14 MED ORDER — PANTOPRAZOLE SODIUM 40 MG IV SOLR
40.0000 mg | Freq: Two times a day (BID) | INTRAVENOUS | Status: DC
  Administered 2020-01-14 – 2020-01-15 (×3): 40 mg via INTRAVENOUS
  Filled 2020-01-14 (×3): qty 40

## 2020-01-14 MED ORDER — SODIUM CHLORIDE 0.9% FLUSH
10.0000 mL | Freq: Two times a day (BID) | INTRAVENOUS | Status: DC
  Administered 2020-01-14 – 2020-01-15 (×2): 10 mL

## 2020-01-14 MED ORDER — SODIUM CHLORIDE 0.9% FLUSH: 10.0000 mL | INTRAVENOUS | Status: DC | PRN

## 2020-01-14 NOTE — Progress Notes (Signed)
Subjective: Patient is waiting for transfer to [facility] for cyst gastrostomy. Pain is adequately controlled. She is n.p.o. except ice chips while being continued on nasojejunal feeding with vital 1.2 at 60 mL/h, tolerating nasojejunal feeds well, has few episodes of loose stools.  Objective: Vital signs in last 24 hours: Temp:  [97.6 F (36.4 C)-99.1 F (37.3 C)] 97.9 F (36.6 C) (03/13 0833) Pulse Rate:  [101-108] 104 (03/13 1147) Resp:  [18-22] 20 (03/13 1147) BP: (141-156)/(66-86) 152/72 (03/13 1147) SpO2:  [91 %-98 %] 96 % (03/13 1147) Weight:  [88.4 kg] 88.4 kg (03/13 0333) Weight change:  Last BM Date: 01/11/20  PE: On oxygen via nasal cannula, intermittent wheezing, able to speak in full words not full sentences GENERAL: Mild pallor, no icterus ABDOMEN: Distended abdomen, mild diffuse tenderness, normoactive bowel sounds EXTREMITIES: Mild bilateral pedal edema  Lab Results: Results for orders placed or performed during the hospital encounter of 01/07/20 (from the past 48 hour(s))  Glucose, capillary     Status: Abnormal   Collection Time: 01/12/20  3:51 PM  Result Value Ref Range   Glucose-Capillary 143 (H) 70 - 99 mg/dL    Comment: Glucose reference range applies only to samples taken after fasting for at least 8 hours.   Comment 1 Notify RN    Comment 2 Document in Chart   Glucose, capillary     Status: Abnormal   Collection Time: 01/12/20  8:35 PM  Result Value Ref Range   Glucose-Capillary 152 (H) 70 - 99 mg/dL    Comment: Glucose reference range applies only to samples taken after fasting for at least 8 hours.  Glucose, capillary     Status: Abnormal   Collection Time: 01/13/20 12:27 AM  Result Value Ref Range   Glucose-Capillary 167 (H) 70 - 99 mg/dL    Comment: Glucose reference range applies only to samples taken after fasting for at least 8 hours.  Glucose, capillary     Status: Abnormal   Collection Time: 01/13/20  4:15 AM  Result Value Ref Range   Glucose-Capillary 164 (H) 70 - 99 mg/dL    Comment: Glucose reference range applies only to samples taken after fasting for at least 8 hours.  CBC with Differential/Platelet     Status: Abnormal   Collection Time: 01/13/20  4:22 AM  Result Value Ref Range   WBC 13.0 (H) 4.0 - 10.5 K/uL   RBC 2.79 (L) 3.87 - 5.11 MIL/uL   Hemoglobin 7.5 (L) 12.0 - 15.0 g/dL   HCT 24.6 (L) 36.0 - 46.0 %   MCV 88.2 80.0 - 100.0 fL   MCH 26.9 26.0 - 34.0 pg   MCHC 30.5 30.0 - 36.0 g/dL   RDW 15.9 (H) 11.5 - 15.5 %   Platelets 282 150 - 400 K/uL   nRBC 0.4 (H) 0.0 - 0.2 %   Neutrophils Relative % 72 %   Neutro Abs 9.3 (H) 1.7 - 7.7 K/uL   Lymphocytes Relative 9 %   Lymphs Abs 1.2 0.7 - 4.0 K/uL   Monocytes Relative 13 %   Monocytes Absolute 1.7 (H) 0.1 - 1.0 K/uL   Eosinophils Relative 0 %   Eosinophils Absolute 0.0 0.0 - 0.5 K/uL   Basophils Relative 0 %   Basophils Absolute 0.1 0.0 - 0.1 K/uL   WBC Morphology MILD LEFT SHIFT (1-5% METAS, OCC MYELO, OCC BANDS)    Immature Granulocytes 6 %   Abs Immature Granulocytes 0.72 (H) 0.00 - 0.07 K/uL  Comment: Performed at [facility], [place] [address] [place], [place] [address]  Comprehensive metabolic panel     Status: Abnormal   Collection Time: 01/13/20  4:22 AM  Result Value Ref Range   Sodium 141 135 - 145 mmol/L   Potassium 4.0 3.5 - 5.1 mmol/L   Chloride 103 98 - 111 mmol/L   CO2 24 22 - 32 mmol/L   Glucose, Bld 163 (H) 70 - 99 mg/dL    Comment: Glucose reference range applies only to samples taken after fasting for at least 8 hours.   BUN 29 (H) 8 - 23 mg/dL   Creatinine, Ser 0.97 0.44 - 1.00 mg/dL   Calcium 9.2 8.9 - 10.3 mg/dL   Total Protein 5.3 (L) 6.5 - 8.1 g/dL   Albumin 2.1 (L) 3.5 - 5.0 g/dL   AST 51 (H) 15 - 41 U/L   ALT 32 0 - 44 U/L   Alkaline Phosphatase 146 (H) 38 - 126 U/L   Total Bilirubin 0.7 0.3 - 1.2 mg/dL   GFR calc non Af Amer 57 (L) >60 mL/min   GFR calc Af Amer >60 >60 mL/min   Anion gap 14 5 - 15     Comment: Performed at [facility] [address], [place], [place] [address]  Glucose, capillary     Status: Abnormal   Collection Time: 01/13/20  8:30 AM  Result Value Ref Range   Glucose-Capillary 188 (H) 70 - 99 mg/dL    Comment: Glucose reference range applies only to samples taken after fasting for at least 8 hours.   Comment 1 Notify RN    Comment 2 Document in Chart   Glucose, capillary     Status: Abnormal   Collection Time: 01/13/20 12:52 PM  Result Value Ref Range   Glucose-Capillary 236 (H) 70 - 99 mg/dL    Comment: Glucose reference range applies only to samples taken after fasting for at least 8 hours.  Glucose, capillary     Status: Abnormal   Collection Time: 01/13/20  3:44 PM  Result Value Ref Range   Glucose-Capillary 213 (H) 70 - 99 mg/dL    Comment: Glucose reference range applies only to samples taken after fasting for at least 8 hours.   Comment 1 Notify RN    Comment 2 Document in Chart   Glucose, capillary     Status: Abnormal   Collection Time: 01/13/20  8:09 PM  Result Value Ref Range   Glucose-Capillary 218 (H) 70 - 99 mg/dL    Comment: Glucose reference range applies only to samples taken after fasting for at least 8 hours.  Glucose, capillary     Status: Abnormal   Collection Time: 01/13/20 11:24 PM  Result Value Ref Range   Glucose-Capillary 199 (H) 70 - 99 mg/dL    Comment: Glucose reference range applies only to samples taken after fasting for at least 8 hours.  Glucose, capillary     Status: Abnormal   Collection Time: 01/14/20  3:37 AM  Result Value Ref Range   Glucose-Capillary 255 (H) 70 - 99 mg/dL    Comment: Glucose reference range applies only to samples taken after fasting for at least 8 hours.  CBC with Differential/Platelet     Status: Abnormal   Collection Time: 01/14/20  4:05 AM  Result Value Ref Range   WBC 14.0 (H) 4.0 - 10.5 K/uL   RBC 2.70 (L) 3.87 - 5.11 MIL/uL   Hemoglobin 7.2 (L) 12.0 -  15.0 g/dL   HCT 23.6 (L) 36.0 -  46.0 %   MCV 87.4 80.0 - 100.0 fL   MCH 26.7 26.0 - 34.0 pg   MCHC 30.5 30.0 - 36.0 g/dL   RDW 16.1 (H) 11.5 - 15.5 %   Platelets 233 150 - 400 K/uL   nRBC 0.3 (H) 0.0 - 0.2 %   Neutrophils Relative % 76 %   Neutro Abs 10.7 (H) 1.7 - 7.7 K/uL   Lymphocytes Relative 9 %   Lymphs Abs 1.2 0.7 - 4.0 K/uL   Monocytes Relative 10 %   Monocytes Absolute 1.4 (H) 0.1 - 1.0 K/uL   Eosinophils Relative 0 %   Eosinophils Absolute 0.0 0.0 - 0.5 K/uL   Basophils Relative 0 %   Basophils Absolute 0.0 0.0 - 0.1 K/uL   Immature Granulocytes 5 %   Abs Immature Granulocytes 0.66 (H) 0.00 - 0.07 K/uL    Comment: Performed at [facility] [address], [place], [place] [address]  Comprehensive metabolic panel     Status: Abnormal   Collection Time: 01/14/20  4:05 AM  Result Value Ref Range   Sodium 135 135 - 145 mmol/L   Potassium 4.2 3.5 - 5.1 mmol/L   Chloride 102 98 - 111 mmol/L   CO2 21 (L) 22 - 32 mmol/L   Glucose, Bld 257 (H) 70 - 99 mg/dL    Comment: Glucose reference range applies only to samples taken after fasting for at least 8 hours.   BUN 31 (H) 8 - 23 mg/dL   Creatinine, Ser 0.99 0.44 - 1.00 mg/dL   Calcium 8.7 (L) 8.9 - 10.3 mg/dL   Total Protein 5.3 (L) 6.5 - 8.1 g/dL   Albumin 2.2 (L) 3.5 - 5.0 g/dL   AST 25 15 - 41 U/L   ALT 29 0 - 44 U/L   Alkaline Phosphatase 138 (H) 38 - 126 U/L   Total Bilirubin 0.4 0.3 - 1.2 mg/dL   GFR calc non Af Amer 56 (L) >60 mL/min   GFR calc Af Amer >60 >60 mL/min   Anion gap 12 5 - 15    Comment: Performed at [facility] [address] [place], [place] [address]  Glucose, capillary     Status: Abnormal   Collection Time: 01/14/20  8:49 AM  Result Value Ref Range   Glucose-Capillary 252 (H) 70 - 99 mg/dL    Comment: Glucose reference range applies only to samples taken after fasting for at least 8 hours.   Comment 1 Notify RN    Comment 2 Document in Chart   Glucose, capillary     Status: Abnormal   Collection Time:  01/14/20 11:59 AM  Result Value Ref Range   Glucose-Capillary 242 (H) 70 - 99 mg/dL    Comment: Glucose reference range applies only to samples taken after fasting for at least 8 hours.   Comment 1 Notify RN    Comment 2 Document in Chart     Studies/Results: No results found.  Medications: I have reviewed the patient's current medications.  Assessment: Acute severe pancreatitis with necrosis and large pancreatic collection Awaiting transfer to [facility] for cystogastrostomy  Mild leukocytosis Normocytic anemia Normal renal function, BUN 31, creatinine 0.99, GFR more than 60 Malnutrition, albumin 2.2, total protein 5.3 Mild acidosis bicarb 21  Plan: Continue nutritional support with nasojejunal feedings Patient does not want to proceed with oral feedings otherwise Continue IV fluid Ringer's lactate at 50 mL/h  Continue IV Reglan as needed  Continue IV Merrem 1 g every 8 hours    Ronnette Juniper, MD 01/14/2020, 3:08 PM

## 2020-01-14 NOTE — Progress Notes (Signed)
PROGRESS NOTE    Katherine Oliver  IOE:[id number] DOB: 05/15/1946 DOA: 01/07/2020 PCP: [name], MD    Brief Narrative:  74 year old female with history of diabetes, prior cholecystectomy in 2003, recent extensive hospitalization due to choledocholithiasis and necrotic pancreatitis, renal failure requiring hemodialysis who was discharged 2 days ago with planned outpatient follow-up.  She was diagnosed with necrotizing pancreatitis, underwent ERCP and stenting, had protracted hospitalization.  Continue to have poor appetite and abdominal pain so presented back to the [facility] hospital. In the emergency room, hemodynamically stable, repeat imaging studies showed increasing necrotic collection in the pancreas that replaced entire pancreas.  Also showed intraperitoneal and retroperitoneal phlegmonous changes. Pt admitted for further management.    Assessment & Plan:   Principal Problem:   Acute necrotizing pancreatitis Active Problems:   HTN (hypertension)   AKI (acute kidney injury) (HCC)   AF (paroxysmal atrial fibrillation) (HCC)   Acute pancreatitis   Pancreatitis  Acute necrotizing pancreatitiswithpancreatic cyst formation andintraperitoneal and retroperitoneal rim enhancing lesions Currently afebrile LFTsremainstable, with low protein and albumin, suggesting malnutrition Due to significant poor nutrition, postpyloric tube placementwas done Tube feeding continued per GI, continues to tolerate thus far Concerns for large pancreatic fluid collection with plan for transfer to [facility] for eval by advanced endoscopist Still awaiting bed availability at [facility] on board,discussed with Dr. Therisa Doyne on 01/11/2020. With acute hematemesis, have ordered CT abd/pelvis with IV contrast Dr. Horris Latino discussed with surgerywhosuggested no intervention. Dr. Rushie Chestnut discussed with interventional radiology, suggested no external drainage.    Initially on PCA analgesia, now symptoms better controlled with scheduled dilaudid per Palliative Care recs Continued on empiricIV meropenem  AKI Labs reviewed, slowly improving Required dialysis brieflylast admission Had declined HD initially, on discussion with GI, pt had agreed to HD again only if needed Cr remains stable at this time  Normocytic anemia/chronic diseasewith acute blood loss anemia Hemoglobin continues to be around baseline Large hematemesis noted the AM of 3/10. Discussed with GI. Concern for bleeding from stress ulcer seen on EGD one month prior Now on protonix gtt. Keep NPO, increase IVF rate Serial hgb has remained stable thus far. No further bleeding noted   Type 2 diabetesmellitus Last A1c 6.4 on 12/14/2019 Noted hyperglycemia Now onLantus, SSI(adjust accordingly),continue withAccu-Cheks, hypoglycemic protocol Holdinghome Metformin while pt in hospital  Hypertension Remains stable currently Norvasc currently remains on hold  Paroxysmal A. Fib Currently rate controlled on sinus rhythm Not on anticoagulation Currently off amiodarone as pt is NPO, continue scheduled IV metoprolol for rate control  GERD Continue PPIas tolerated  Obesity Lifestyle modification advised  Hypoxia Pt currently with 5LNC, appears quite comfortable and pt denies feeling sob CT abd/pelvis reviewed, findings suggesting compressive atelectasis noted as well as some anasarca Given trial of IV lasix Currently on [facility]  Goals of care Patient with very guarded prognosis Palliative had been following. Full Code  DVT prophylaxis: SCD's Code Status: Full Family Communication: Pt in room, family not at bedside Disposition Plan: From home, plan transfer to [facility] when bed available  Consultants:   GI  IR General Surgery  Palliative Care  Procedures:     Antimicrobials: Anti-infectives (From admission, onward)   Start     Dose/Rate Route  Frequency Ordered Stop   01/13/20 1600  meropenem (MERREM) 1 g in sodium chloride 0.9 % 100 mL IVPB     1 g 200 mL/hr over 30 Minutes Intravenous Every 8 hours 01/13/20 1102  01/12/20 0000  meropenem 1 g in sodium chloride 0.9 % 100 mL     1 g Intravenous Every 12 hours 01/12/20 1548     01/10/20 1115  meropenem (MERREM) 1 g in sodium chloride 0.9 % 100 mL IVPB     1 g 200 mL/hr over 30 Minutes Intravenous Every 12 hours 01/10/20 1007 01/13/20 1153   01/07/20 1800  fluconazole (DIFLUCAN) tablet 150 mg     150 mg Oral  Once 01/07/20 1610 01/07/20 1757   01/07/20 0200  meropenem (MERREM) 500 mg in sodium chloride 0.9 % 100 mL IVPB  Status:  Discontinued     500 mg 200 mL/hr over 30 Minutes Intravenous Every 12 hours 01/07/20 0145 01/09/20 1345      Subjective: Eagerly awaiting transfer to [facility]  Objective: Vitals:   01/14/20 0340 01/14/20 0833 01/14/20 1147 01/14/20 1540  BP: (!) 141/73 (!) 154/66 (!) 152/72 (!) 158/81  Pulse: (!) 101 (!) 102 (!) 104 (!) 102  Resp: 20 20 20 16   Temp: 98.3 F (36.8 C) 97.9 F (36.6 C)  98.3 F (36.8 C)  TempSrc: Oral Oral    SpO2: 96% 98% 96% 97%  Weight:      Height:        Intake/Output Summary (Last 24 hours) at 01/14/2020 1638 Last data filed at 01/14/2020 0400 Gross per 24 hour  Intake 1938.38 ml  Output --  Net 1938.38 ml   Filed Weights   01/11/20 0031 01/12/20 0500 01/14/20 0333  Weight: 87.2 kg 88.6 kg 88.4 kg    Examination: General exam: Conversant, in no acute distress Respiratory system: normal chest rise, clear, no audible wheezing Cardiovascular system: regular rhythm, s1-s2 Gastrointestinal system: Distended, decreased bs Central nervous system: No seizures, no tremors Extremities: No cyanosis, no joint deformities Skin: No rashes, no pallor Psychiatry: Affect normal // no auditory hallucinations   Data Reviewed: I have personally reviewed following labs and imaging studies  CBC: Recent Labs  Lab  01/10/20 0234 01/10/20 0234 01/11/20 0440 01/11/20 0440 01/11/20 0955 01/11/20 1643 01/12/20 0512 01/13/20 0422 01/14/20 0405  WBC 9.4  --  11.2*  --   --   --  11.7* 13.0* 14.0*  NEUTROABS 6.6  --  9.3*  --   --   --  10.8* 9.3* 10.7*  HGB 7.9*   < > 7.6*   < > 7.9* 7.5* 7.2* 7.5* 7.2*  HCT 25.0*   < > 24.4*   < > 25.3* 23.8* 23.2* 24.6* 23.6*  MCV 85.6  --  86.5  --   --   --  86.2 88.2 87.4  PLT 246  --  293  --   --   --  274 282 233   < > = values in this interval not displayed.   Basic Metabolic Panel: Recent Labs  Lab 01/08/20 0235 01/08/20 0235 01/09/20 0202 01/09/20 0202 01/10/20 0234 01/11/20 0440 01/12/20 0512 01/13/20 0422 01/14/20 0405  NA 137   < > 133*   < > 134* 134* 137 141 135  K 3.9   < > 3.5   < > 3.9 4.0 4.1 4.0 4.2  CL 105   < > 102   < > 102 101 103 103 102  CO2 23   < > 22   < > 21* 23 23 24  21*  GLUCOSE 190*   < > 192*   < > 329* 308* 204* 163* 257*  BUN 23   < >  22   < > 21 24* 27* 29* 31*  CREATININE 1.68*   < > 1.50*   < > 1.46* 1.35* 1.00 0.97 0.99  CALCIUM 8.3*   < > 8.5*   < > 8.6* 8.8* 8.6* 9.2 8.7*  MG 1.1*  --  1.9  --  1.7  --   --   --   --   PHOS 3.2  --  2.3*  --  2.7  --   --   --   --    < > = values in this interval not displayed.   GFR: Estimated Creatinine Clearance: 49.6 mL/min (by C-G formula based on SCr of 0.99 mg/dL). Liver Function Tests: Recent Labs  Lab 01/10/20 0234 01/11/20 0440 01/12/20 0512 01/13/20 0422 01/14/20 0405  AST 15 25 19  51* 25  ALT 13 17 16  32 29  ALKPHOS 116 134* 101 146* 138*  BILITOT 0.3 0.5 0.5 0.7 0.4  PROT 5.5* 5.5* 4.8* 5.3* 5.3*  ALBUMIN 2.2* 2.2* 2.0* 2.1* 2.2*   No results for input(s): LIPASE, AMYLASE in the last 168 hours. No results for input(s): AMMONIA in the last 168 hours. Coagulation Profile: No results for input(s): INR, PROTIME in the last 168 hours. Cardiac Enzymes: No results for input(s): CKTOTAL, CKMB, CKMBINDEX, TROPONINI in the last 168 hours. BNP (last 3  results) No results for input(s): PROBNP in the last 8760 hours. HbA1C: No results for input(s): HGBA1C in the last 72 hours. CBG: Recent Labs  Lab 01/13/20 2324 01/14/20 0337 01/14/20 0849 01/14/20 1159 01/14/20 1540  GLUCAP 199* 255* 252* 242* 248*   Lipid Profile: No results for input(s): CHOL, HDL, LDLCALC, TRIG, CHOLHDL, LDLDIRECT in the last 72 hours. Thyroid Function Tests: No results for input(s): TSH, T4TOTAL, FREET4, T3FREE, THYROIDAB in the last 72 hours. Anemia Panel: No results for input(s): VITAMINB12, FOLATE, FERRITIN, TIBC, IRON, RETICCTPCT in the last 72 hours. Sepsis Labs: No results for input(s): PROCALCITON, LATICACIDVEN in the last 168 hours.  Recent Results (from the past 240 hour(s))  Respiratory Panel by RT PCR (Flu A&B, Covid) - Nasopharyngeal Swab     Status: None   Collection Time: 01/06/20  5:16 PM   Specimen: Nasopharyngeal Swab  Result Value Ref Range Status   SARS Coronavirus 2 by RT PCR NEGATIVE NEGATIVE Final    Comment: (NOTE) SARS-CoV-2 target nucleic acids are NOT DETECTED. The SARS-CoV-2 RNA is generally detectable in upper respiratoy specimens during the acute phase of infection. The lowest concentration of SARS-CoV-2 viral copies this assay can detect is 131 copies/mL. A negative result does not preclude SARS-Cov-2 infection and should not be used as the sole basis for treatment or other patient management decisions. A negative result may occur with  improper specimen collection/handling, submission of specimen other than nasopharyngeal swab, presence of viral mutation(s) within the areas targeted by this assay, and inadequate number of viral copies (<131 copies/mL). A negative result must be combined with clinical observations, patient history, and epidemiological information. The expected result is Negative. Fact Sheet for Patients:  PinkCheek.be Fact Sheet for Healthcare Providers:   GravelBags.it This test is not yet ap proved or cleared by the [place] FDA and  has been authorized for detection and/or diagnosis of SARS-CoV-2 by FDA under an Emergency Use Authorization (EUA). This EUA will remain  in effect (meaning this test can be used) for the duration of the COVID-19 declaration under Section 564(b)(1) of the Act, 21 U.S.C. section 360bbb-3(b)(1), unless the authorization is  terminated or revoked sooner.    Influenza A by PCR NEGATIVE NEGATIVE Final   Influenza B by PCR NEGATIVE NEGATIVE Final    Comment: (NOTE) The Xpert Xpress SARS-CoV-2/FLU/RSV assay is intended as an aid in  the diagnosis of influenza from Nasopharyngeal swab specimens and  should not be used as a sole basis for treatment. Nasal washings and  aspirates are unacceptable for Xpert Xpress SARS-CoV-2/FLU/RSV  testing. Fact Sheet for Patients: PinkCheek.be Fact Sheet for Healthcare Providers: GravelBags.it This test is not yet approved or cleared by the [place] FDA and  has been authorized for detection and/or diagnosis of SARS-CoV-2 by  FDA under an Emergency Use Authorization (EUA). This EUA will remain  in effect (meaning this test can be used) for the duration of the  Covid-19 declaration under Section 564(b)(1) of the Act, 21  U.S.C. section 360bbb-3(b)(1), unless the authorization is  terminated or revoked. Performed at [facility], [address] [place],  [address]      Radiology Studies: No results found.  Scheduled Meds: .  HYDROmorphone (DILAUDID) injection  0.5 mg Intravenous Q6H  . insulin aspart  0-15 Units Subcutaneous Q4H  . insulin glargine  10 Units Subcutaneous Daily  . metoprolol tartrate  5 mg Intravenous Q6H  . nystatin cream   Topical BID  . pantoprazole (PROTONIX) IV  40 mg Intravenous Q12H   Continuous Infusions: . feeding supplement (VITAL  AF 1.2 CAL) 1,000 mL (01/13/20 2055)  . lactated ringers 50 mL/hr at 01/14/20 0552  . meropenem (MERREM) IV 1 g (01/14/20 1404)     LOS: 7 days   Marylu Lund, MD [facility] Pager On Amion  If 7PM-7AM, please contact night-coverage 01/14/2020, 4:38 PM

## 2020-01-15 DIAGNOSIS — E1165 Type 2 diabetes mellitus with hyperglycemia: Secondary | ICD-10-CM | POA: Diagnosis not present

## 2020-01-15 DIAGNOSIS — I4891 Unspecified atrial fibrillation: Secondary | ICD-10-CM | POA: Diagnosis not present

## 2020-01-15 DIAGNOSIS — I82890 Acute embolism and thrombosis of other specified veins: Secondary | ICD-10-CM | POA: Diagnosis not present

## 2020-01-15 DIAGNOSIS — D649 Anemia, unspecified: Secondary | ICD-10-CM | POA: Diagnosis not present

## 2020-01-15 DIAGNOSIS — K3189 Other diseases of stomach and duodenum: Secondary | ICD-10-CM | POA: Diagnosis not present

## 2020-01-15 DIAGNOSIS — N281 Cyst of kidney, acquired: Secondary | ICD-10-CM | POA: Diagnosis not present

## 2020-01-15 DIAGNOSIS — K208 Other esophagitis without bleeding: Secondary | ICD-10-CM | POA: Diagnosis not present

## 2020-01-15 DIAGNOSIS — J9811 Atelectasis: Secondary | ICD-10-CM | POA: Diagnosis not present

## 2020-01-15 DIAGNOSIS — K863 Pseudocyst of pancreas: Secondary | ICD-10-CM | POA: Insufficient documentation

## 2020-01-15 DIAGNOSIS — E669 Obesity, unspecified: Secondary | ICD-10-CM | POA: Diagnosis not present

## 2020-01-15 DIAGNOSIS — K209 Esophagitis, unspecified without bleeding: Secondary | ICD-10-CM | POA: Diagnosis not present

## 2020-01-15 DIAGNOSIS — E8770 Fluid overload, unspecified: Secondary | ICD-10-CM | POA: Diagnosis present

## 2020-01-15 DIAGNOSIS — K8689 Other specified diseases of pancreas: Secondary | ICD-10-CM | POA: Diagnosis not present

## 2020-01-15 DIAGNOSIS — I480 Paroxysmal atrial fibrillation: Secondary | ICD-10-CM | POA: Insufficient documentation

## 2020-01-15 DIAGNOSIS — R911 Solitary pulmonary nodule: Secondary | ICD-10-CM | POA: Diagnosis not present

## 2020-01-15 DIAGNOSIS — E118 Type 2 diabetes mellitus with unspecified complications: Secondary | ICD-10-CM | POA: Diagnosis not present

## 2020-01-15 DIAGNOSIS — E8771 Transfusion associated circulatory overload: Secondary | ICD-10-CM | POA: Diagnosis not present

## 2020-01-15 DIAGNOSIS — R58 Hemorrhage, not elsewhere classified: Secondary | ICD-10-CM | POA: Diagnosis not present

## 2020-01-15 DIAGNOSIS — D631 Anemia in chronic kidney disease: Secondary | ICD-10-CM | POA: Diagnosis not present

## 2020-01-15 DIAGNOSIS — I10 Essential (primary) hypertension: Secondary | ICD-10-CM | POA: Diagnosis present

## 2020-01-15 DIAGNOSIS — Z90411 Acquired partial absence of pancreas: Secondary | ICD-10-CM | POA: Diagnosis not present

## 2020-01-15 DIAGNOSIS — Z4659 Encounter for fitting and adjustment of other gastrointestinal appliance and device: Secondary | ICD-10-CM | POA: Diagnosis not present

## 2020-01-15 DIAGNOSIS — K8591 Acute pancreatitis with uninfected necrosis, unspecified: Secondary | ICD-10-CM | POA: Diagnosis not present

## 2020-01-15 DIAGNOSIS — Z7401 Bed confinement status: Secondary | ICD-10-CM | POA: Diagnosis not present

## 2020-01-15 DIAGNOSIS — R945 Abnormal results of liver function studies: Secondary | ICD-10-CM | POA: Diagnosis not present

## 2020-01-15 DIAGNOSIS — M65321 Trigger finger, right index finger: Secondary | ICD-10-CM | POA: Diagnosis not present

## 2020-01-15 DIAGNOSIS — E872 Acidosis: Secondary | ICD-10-CM | POA: Diagnosis not present

## 2020-01-15 DIAGNOSIS — I129 Hypertensive chronic kidney disease with stage 1 through stage 4 chronic kidney disease, or unspecified chronic kidney disease: Secondary | ICD-10-CM | POA: Diagnosis not present

## 2020-01-15 DIAGNOSIS — D62 Acute posthemorrhagic anemia: Secondary | ICD-10-CM | POA: Diagnosis present

## 2020-01-15 DIAGNOSIS — K219 Gastro-esophageal reflux disease without esophagitis: Secondary | ICD-10-CM | POA: Diagnosis not present

## 2020-01-15 DIAGNOSIS — A0472 Enterocolitis due to Clostridium difficile, not specified as recurrent: Secondary | ICD-10-CM | POA: Diagnosis not present

## 2020-01-15 DIAGNOSIS — K862 Cyst of pancreas: Secondary | ICD-10-CM | POA: Diagnosis not present

## 2020-01-15 DIAGNOSIS — K838 Other specified diseases of biliary tract: Secondary | ICD-10-CM | POA: Diagnosis not present

## 2020-01-15 DIAGNOSIS — K2970 Gastritis, unspecified, without bleeding: Secondary | ICD-10-CM | POA: Diagnosis not present

## 2020-01-15 DIAGNOSIS — I81 Portal vein thrombosis: Secondary | ICD-10-CM | POA: Diagnosis present

## 2020-01-15 DIAGNOSIS — I82411 Acute embolism and thrombosis of right femoral vein: Secondary | ICD-10-CM | POA: Diagnosis not present

## 2020-01-15 DIAGNOSIS — K259 Gastric ulcer, unspecified as acute or chronic, without hemorrhage or perforation: Secondary | ICD-10-CM | POA: Diagnosis not present

## 2020-01-15 DIAGNOSIS — K254 Chronic or unspecified gastric ulcer with hemorrhage: Secondary | ICD-10-CM | POA: Diagnosis present

## 2020-01-15 DIAGNOSIS — K8592 Acute pancreatitis with infected necrosis, unspecified: Secondary | ICD-10-CM | POA: Diagnosis not present

## 2020-01-15 DIAGNOSIS — R0902 Hypoxemia: Secondary | ICD-10-CM | POA: Diagnosis not present

## 2020-01-15 DIAGNOSIS — E43 Unspecified severe protein-calorie malnutrition: Secondary | ICD-10-CM | POA: Diagnosis present

## 2020-01-15 DIAGNOSIS — K859 Acute pancreatitis without necrosis or infection, unspecified: Secondary | ICD-10-CM | POA: Diagnosis not present

## 2020-01-15 DIAGNOSIS — K319 Disease of stomach and duodenum, unspecified: Secondary | ICD-10-CM | POA: Diagnosis present

## 2020-01-15 DIAGNOSIS — K831 Obstruction of bile duct: Secondary | ICD-10-CM | POA: Diagnosis not present

## 2020-01-15 DIAGNOSIS — D500 Iron deficiency anemia secondary to blood loss (chronic): Secondary | ICD-10-CM | POA: Diagnosis not present

## 2020-01-15 DIAGNOSIS — J810 Acute pulmonary edema: Secondary | ICD-10-CM | POA: Diagnosis present

## 2020-01-15 DIAGNOSIS — N189 Chronic kidney disease, unspecified: Secondary | ICD-10-CM | POA: Diagnosis not present

## 2020-01-15 DIAGNOSIS — N183 Chronic kidney disease, stage 3 unspecified: Secondary | ICD-10-CM | POA: Diagnosis not present

## 2020-01-15 DIAGNOSIS — D509 Iron deficiency anemia, unspecified: Secondary | ICD-10-CM | POA: Diagnosis not present

## 2020-01-15 DIAGNOSIS — R188 Other ascites: Secondary | ICD-10-CM | POA: Diagnosis present

## 2020-01-15 DIAGNOSIS — T18128A Food in esophagus causing other injury, initial encounter: Secondary | ICD-10-CM | POA: Diagnosis not present

## 2020-01-15 DIAGNOSIS — R0602 Shortness of breath: Secondary | ICD-10-CM | POA: Diagnosis not present

## 2020-01-15 DIAGNOSIS — K922 Gastrointestinal hemorrhage, unspecified: Secondary | ICD-10-CM | POA: Diagnosis not present

## 2020-01-15 DIAGNOSIS — Z789 Other specified health status: Secondary | ICD-10-CM | POA: Diagnosis not present

## 2020-01-15 DIAGNOSIS — B9689 Other specified bacterial agents as the cause of diseases classified elsewhere: Secondary | ICD-10-CM | POA: Diagnosis not present

## 2020-01-15 DIAGNOSIS — Z8719 Personal history of other diseases of the digestive system: Secondary | ICD-10-CM | POA: Diagnosis not present

## 2020-01-15 DIAGNOSIS — R140 Abdominal distension (gaseous): Secondary | ICD-10-CM | POA: Diagnosis not present

## 2020-01-15 DIAGNOSIS — Z48815 Encounter for surgical aftercare following surgery on the digestive system: Secondary | ICD-10-CM | POA: Diagnosis not present

## 2020-01-15 DIAGNOSIS — D508 Other iron deficiency anemias: Secondary | ICD-10-CM | POA: Diagnosis not present

## 2020-01-15 DIAGNOSIS — K660 Peritoneal adhesions (postprocedural) (postinfection): Secondary | ICD-10-CM | POA: Diagnosis not present

## 2020-01-15 DIAGNOSIS — R935 Abnormal findings on diagnostic imaging of other abdominal regions, including retroperitoneum: Secondary | ICD-10-CM | POA: Diagnosis not present

## 2020-01-15 DIAGNOSIS — J9601 Acute respiratory failure with hypoxia: Secondary | ICD-10-CM | POA: Diagnosis present

## 2020-01-15 DIAGNOSIS — I472 Ventricular tachycardia: Secondary | ICD-10-CM | POA: Diagnosis not present

## 2020-01-15 DIAGNOSIS — Z9889 Other specified postprocedural states: Secondary | ICD-10-CM | POA: Diagnosis not present

## 2020-01-15 DIAGNOSIS — I871 Compression of vein: Secondary | ICD-10-CM | POA: Diagnosis present

## 2020-01-15 DIAGNOSIS — N289 Disorder of kidney and ureter, unspecified: Secondary | ICD-10-CM | POA: Diagnosis not present

## 2020-01-15 DIAGNOSIS — I471 Supraventricular tachycardia: Secondary | ICD-10-CM | POA: Diagnosis not present

## 2020-01-15 DIAGNOSIS — M6281 Muscle weakness (generalized): Secondary | ICD-10-CM | POA: Diagnosis not present

## 2020-01-15 DIAGNOSIS — I700 Atherosclerosis of aorta: Secondary | ICD-10-CM | POA: Diagnosis not present

## 2020-01-15 DIAGNOSIS — Z4682 Encounter for fitting and adjustment of non-vascular catheter: Secondary | ICD-10-CM | POA: Diagnosis not present

## 2020-01-15 DIAGNOSIS — K851 Biliary acute pancreatitis without necrosis or infection: Secondary | ICD-10-CM | POA: Diagnosis not present

## 2020-01-15 DIAGNOSIS — K760 Fatty (change of) liver, not elsewhere classified: Secondary | ICD-10-CM | POA: Diagnosis present

## 2020-01-15 DIAGNOSIS — I864 Gastric varices: Secondary | ICD-10-CM | POA: Diagnosis not present

## 2020-01-15 DIAGNOSIS — Z452 Encounter for adjustment and management of vascular access device: Secondary | ICD-10-CM | POA: Diagnosis not present

## 2020-01-15 DIAGNOSIS — Z9689 Presence of other specified functional implants: Secondary | ICD-10-CM | POA: Diagnosis not present

## 2020-01-15 DIAGNOSIS — J90 Pleural effusion, not elsewhere classified: Secondary | ICD-10-CM | POA: Diagnosis present

## 2020-01-15 DIAGNOSIS — T182XXA Foreign body in stomach, initial encounter: Secondary | ICD-10-CM | POA: Diagnosis not present

## 2020-01-15 DIAGNOSIS — Z978 Presence of other specified devices: Secondary | ICD-10-CM | POA: Diagnosis not present

## 2020-01-15 DIAGNOSIS — E119 Type 2 diabetes mellitus without complications: Secondary | ICD-10-CM | POA: Diagnosis not present

## 2020-01-15 DIAGNOSIS — K8681 Exocrine pancreatic insufficiency: Secondary | ICD-10-CM | POA: Diagnosis present

## 2020-01-15 DIAGNOSIS — N179 Acute kidney failure, unspecified: Secondary | ICD-10-CM | POA: Diagnosis present

## 2020-01-15 DIAGNOSIS — K8512 Biliary acute pancreatitis with infected necrosis: Secondary | ICD-10-CM | POA: Diagnosis present

## 2020-01-15 DIAGNOSIS — J811 Chronic pulmonary edema: Secondary | ICD-10-CM | POA: Diagnosis not present

## 2020-01-15 DIAGNOSIS — K253 Acute gastric ulcer without hemorrhage or perforation: Secondary | ICD-10-CM | POA: Diagnosis not present

## 2020-01-15 DIAGNOSIS — Z20822 Contact with and (suspected) exposure to covid-19: Secondary | ICD-10-CM | POA: Diagnosis present

## 2020-01-15 DIAGNOSIS — R109 Unspecified abdominal pain: Secondary | ICD-10-CM | POA: Diagnosis not present

## 2020-01-15 DIAGNOSIS — R918 Other nonspecific abnormal finding of lung field: Secondary | ICD-10-CM | POA: Diagnosis not present

## 2020-01-15 DIAGNOSIS — M8580 Other specified disorders of bone density and structure, unspecified site: Secondary | ICD-10-CM | POA: Diagnosis not present

## 2020-01-15 DIAGNOSIS — E1122 Type 2 diabetes mellitus with diabetic chronic kidney disease: Secondary | ICD-10-CM | POA: Diagnosis not present

## 2020-01-15 LAB — COMPREHENSIVE METABOLIC PANEL
ALT: 23 U/L (ref 0–44)
AST: 25 U/L (ref 15–41)
Albumin: 1.8 g/dL — ABNORMAL LOW (ref 3.5–5.0)
Alkaline Phosphatase: 136 U/L — ABNORMAL HIGH (ref 38–126)
Anion gap: 8 (ref 5–15)
BUN: 31 mg/dL — ABNORMAL HIGH (ref 8–23)
CO2: 24 mmol/L (ref 22–32)
Calcium: 8.2 mg/dL — ABNORMAL LOW (ref 8.9–10.3)
Chloride: 102 mmol/L (ref 98–111)
Creatinine, Ser: 0.92 mg/dL (ref 0.44–1.00)
GFR calc Af Amer: >60 mL/min (ref >60)
GFR calc non Af Amer: >60 mL/min (ref >60)
Glucose, Bld: 247 mg/dL — ABNORMAL HIGH (ref 70–99)
Potassium: 4.4 mmol/L (ref 3.5–5.1)
Sodium: 134 mmol/L — ABNORMAL LOW (ref 135–145)
Total Bilirubin: 0.5 mg/dL (ref 0.3–1.2)
Total Protein: 4.6 g/dL — ABNORMAL LOW (ref 6.5–8.1)

## 2020-01-15 LAB — CBC WITH DIFFERENTIAL/PLATELET
Abs Immature Granulocytes: 0.48 10*3/uL — ABNORMAL HIGH (ref 0.00–0.07)
Basophils Absolute: 0 10*3/uL (ref 0.0–0.1)
Basophils Relative: 0 %
Eosinophils Absolute: 0 10*3/uL (ref 0.0–0.5)
Eosinophils Relative: 0 %
HCT: 22.1 % — ABNORMAL LOW (ref 36.0–46.0)
Hemoglobin: 6.9 g/dL — CL (ref 12.0–15.0)
Immature Granulocytes: 5 %
Lymphocytes Relative: 11 %
Lymphs Abs: 1 10*3/uL (ref 0.7–4.0)
MCH: 26.7 pg (ref 26.0–34.0)
MCHC: 31.2 g/dL (ref 30.0–36.0)
MCV: 85.7 fL (ref 80.0–100.0)
Monocytes Absolute: 1 10*3/uL (ref 0.1–1.0)
Monocytes Relative: 10 %
Neutro Abs: 7.3 10*3/uL (ref 1.7–7.7)
Neutrophils Relative %: 74 %
Platelets: 196 10*3/uL (ref 150–400)
RBC: 2.58 MIL/uL — ABNORMAL LOW (ref 3.87–5.11)
RDW: 16.1 % — ABNORMAL HIGH (ref 11.5–15.5)
WBC: 9.8 10*3/uL (ref 4.0–10.5)
nRBC: 0.3 % — ABNORMAL HIGH (ref 0.0–0.2)

## 2020-01-15 LAB — GLUCOSE, CAPILLARY
Glucose-Capillary: 231 mg/dL — ABNORMAL HIGH (ref 70–99)
Glucose-Capillary: 262 mg/dL — ABNORMAL HIGH (ref 70–99)
Glucose-Capillary: 219 mg/dL — ABNORMAL HIGH (ref 70–99)

## 2020-01-15 LAB — TYPE AND SCREEN
ABO/RH(D): B POS
Antibody Screen: NEGATIVE

## 2020-01-15 LAB — HEMOGLOBIN AND HEMATOCRIT, BLOOD
HCT: 22.8 % — ABNORMAL LOW (ref 36.0–46.0)
Hemoglobin: 7 g/dL — ABNORMAL LOW (ref 12.0–15.0)

## 2020-01-15 MED ORDER — SODIUM CHLORIDE 0.9% IV SOLUTION: Freq: Once | INTRAVENOUS | Status: DC

## 2020-01-15 MED ORDER — PANTOPRAZOLE SODIUM 40 MG IV SOLR: 40.0000 mg | Freq: Two times a day (BID) | INTRAVENOUS | Status: DC

## 2020-01-15 NOTE — Progress Notes (Signed)
Subjective: Patient has been tolerating nasojejunal feeds at 60 mL/h without further evidence of nausea or vomiting.  She is on ice chips but does not want any oral intake otherwise. She has a few loose bowel movements but denies blood in stool or black stools. Her pain is fairly well controlled, however she gets nauseous after taking narcotics, and is also on Zofran as needed.  Objective: Vital signs in last 24 hours: Temp:  [97.9 F (36.6 C)-98.3 F (36.8 C)] 98 F (36.7 C) (03/14 0416) Pulse Rate:  [76-104] 96 (03/14 0728) Resp:  [16-20] 20 (03/14 0728) BP: (143-158)/(67-90) 151/69 (03/14 0728) SpO2:  [96 %-98 %] 97 % (03/14 0728) Weight:  [92.1 kg] 92.1 kg (03/14 0420) Weight change: 3.7 kg Last BM Date: 01/11/20  PE: On oxygen via nasal cannula, mildly short of breath, able to speak in full sentences and not using accessory muscles of respiration GENERAL: Lying on bed, prominent pallor, no icterus ABDOMEN: Distended abdomen, mild generalized tenderness, normal active bowel sounds EXTREMITIES: Minimal pitting pedal edema, no deformity  Lab Results: Results for orders placed or performed during the hospital encounter of 01/07/20 (from the past 48 hour(s))  Glucose, capillary     Status: Abnormal   Collection Time: 01/13/20  8:30 AM  Result Value Ref Range   Glucose-Capillary 188 (H) 70 - 99 mg/dL    Comment: Glucose reference range applies only to samples taken after fasting for at least 8 hours.   Comment 1 Notify RN    Comment 2 Document in Chart   Glucose, capillary     Status: Abnormal   Collection Time: 01/13/20 12:52 PM  Result Value Ref Range   Glucose-Capillary 236 (H) 70 - 99 mg/dL    Comment: Glucose reference range applies only to samples taken after fasting for at least 8 hours.  Glucose, capillary     Status: Abnormal   Collection Time: 01/13/20  3:44 PM  Result Value Ref Range   Glucose-Capillary 213 (H) 70 - 99 mg/dL    Comment: Glucose reference range  applies only to samples taken after fasting for at least 8 hours.   Comment 1 Notify RN    Comment 2 Document in Chart   Glucose, capillary     Status: Abnormal   Collection Time: 01/13/20  8:09 PM  Result Value Ref Range   Glucose-Capillary 218 (H) 70 - 99 mg/dL    Comment: Glucose reference range applies only to samples taken after fasting for at least 8 hours.  Glucose, capillary     Status: Abnormal   Collection Time: 01/13/20 11:24 PM  Result Value Ref Range   Glucose-Capillary 199 (H) 70 - 99 mg/dL    Comment: Glucose reference range applies only to samples taken after fasting for at least 8 hours.  Glucose, capillary     Status: Abnormal   Collection Time: 01/14/20  3:37 AM  Result Value Ref Range   Glucose-Capillary 255 (H) 70 - 99 mg/dL    Comment: Glucose reference range applies only to samples taken after fasting for at least 8 hours.  CBC with Differential/Platelet     Status: Abnormal   Collection Time: 01/14/20  4:05 AM  Result Value Ref Range   WBC 14.0 (H) 4.0 - 10.5 K/uL   RBC 2.70 (L) 3.87 - 5.11 MIL/uL   Hemoglobin 7.2 (L) 12.0 - 15.0 g/dL   HCT 23.6 (L) 36.0 - 46.0 %   MCV 87.4 80.0 - 100.0 fL   MCH  26.7 26.0 - 34.0 pg   MCHC 30.5 30.0 - 36.0 g/dL   RDW 16.1 (H) 11.5 - 15.5 %   Platelets 233 150 - 400 K/uL   nRBC 0.3 (H) 0.0 - 0.2 %   Neutrophils Relative % 76 %   Neutro Abs 10.7 (H) 1.7 - 7.7 K/uL   Lymphocytes Relative 9 %   Lymphs Abs 1.2 0.7 - 4.0 K/uL   Monocytes Relative 10 %   Monocytes Absolute 1.4 (H) 0.1 - 1.0 K/uL   Eosinophils Relative 0 %   Eosinophils Absolute 0.0 0.0 - 0.5 K/uL   Basophils Relative 0 %   Basophils Absolute 0.0 0.0 - 0.1 K/uL   Immature Granulocytes 5 %   Abs Immature Granulocytes 0.66 (H) 0.00 - 0.07 K/uL    Comment: Performed at [facility] [address] [place], [place] [address]  Comprehensive metabolic panel     Status: Abnormal   Collection Time: 01/14/20  4:05 AM  Result Value Ref Range   Sodium 135 135  - 145 mmol/L   Potassium 4.2 3.5 - 5.1 mmol/L   Chloride 102 98 - 111 mmol/L   CO2 21 (L) 22 - 32 mmol/L   Glucose, Bld 257 (H) 70 - 99 mg/dL    Comment: Glucose reference range applies only to samples taken after fasting for at least 8 hours.   BUN 31 (H) 8 - 23 mg/dL   Creatinine, Ser 0.99 0.44 - 1.00 mg/dL   Calcium 8.7 (L) 8.9 - 10.3 mg/dL   Total Protein 5.3 (L) 6.5 - 8.1 g/dL   Albumin 2.2 (L) 3.5 - 5.0 g/dL   AST 25 15 - 41 U/L   ALT 29 0 - 44 U/L   Alkaline Phosphatase 138 (H) 38 - 126 U/L   Total Bilirubin 0.4 0.3 - 1.2 mg/dL   GFR calc non Af Amer 56 (L) >60 mL/min   GFR calc Af Amer >60 >60 mL/min   Anion gap 12 5 - 15    Comment: Performed at [facility] [address] [place], [place] [address]  Glucose, capillary     Status: Abnormal   Collection Time: 01/14/20  8:49 AM  Result Value Ref Range   Glucose-Capillary 252 (H) 70 - 99 mg/dL    Comment: Glucose reference range applies only to samples taken after fasting for at least 8 hours.   Comment 1 Notify RN    Comment 2 Document in Chart   Glucose, capillary     Status: Abnormal   Collection Time: 01/14/20 11:59 AM  Result Value Ref Range   Glucose-Capillary 242 (H) 70 - 99 mg/dL    Comment: Glucose reference range applies only to samples taken after fasting for at least 8 hours.   Comment 1 Notify RN    Comment 2 Document in Chart   Glucose, capillary     Status: Abnormal   Collection Time: 01/14/20  3:40 PM  Result Value Ref Range   Glucose-Capillary 248 (H) 70 - 99 mg/dL    Comment: Glucose reference range applies only to samples taken after fasting for at least 8 hours.  Glucose, capillary     Status: Abnormal   Collection Time: 01/14/20  7:50 PM  Result Value Ref Range   Glucose-Capillary 280 (H) 70 - 99 mg/dL    Comment: Glucose reference range applies only to samples taken after fasting for at least 8 hours.   Comment 1 Notify RN  Comment 2 Document in Chart   Glucose, capillary     Status:  Abnormal   Collection Time: 01/14/20 11:32 PM  Result Value Ref Range   Glucose-Capillary 202 (H) 70 - 99 mg/dL    Comment: Glucose reference range applies only to samples taken after fasting for at least 8 hours.  Glucose, capillary     Status: Abnormal   Collection Time: 01/15/20  4:15 AM  Result Value Ref Range   Glucose-Capillary 231 (H) 70 - 99 mg/dL    Comment: Glucose reference range applies only to samples taken after fasting for at least 8 hours.  CBC with Differential/Platelet     Status: Abnormal   Collection Time: 01/15/20  4:30 AM  Result Value Ref Range   WBC 9.8 4.0 - 10.5 K/uL   RBC 2.58 (L) 3.87 - 5.11 MIL/uL   Hemoglobin 6.9 (LL) 12.0 - 15.0 g/dL    Comment: REPEATED TO VERIFY THIS CRITICAL RESULT HAS VERIFIED AND BEEN CALLED TO M.BURROUGHS,RN BY MELISSA BROGDON ON 03 14 2021 AT 0503, AND HAS BEEN READ BACK.     HCT 22.1 (L) 36.0 - 46.0 %   MCV 85.7 80.0 - 100.0 fL   MCH 26.7 26.0 - 34.0 pg   MCHC 31.2 30.0 - 36.0 g/dL   RDW 16.1 (H) 11.5 - 15.5 %   Platelets 196 150 - 400 K/uL   nRBC 0.3 (H) 0.0 - 0.2 %   Neutrophils Relative % 74 %   Neutro Abs 7.3 1.7 - 7.7 K/uL   Lymphocytes Relative 11 %   Lymphs Abs 1.0 0.7 - 4.0 K/uL   Monocytes Relative 10 %   Monocytes Absolute 1.0 0.1 - 1.0 K/uL   Eosinophils Relative 0 %   Eosinophils Absolute 0.0 0.0 - 0.5 K/uL   Basophils Relative 0 %   Basophils Absolute 0.0 0.0 - 0.1 K/uL   Immature Granulocytes 5 %   Abs Immature Granulocytes 0.48 (H) 0.00 - 0.07 K/uL    Comment: Performed at [facility] [address], [place], [place] [address]  Comprehensive metabolic panel     Status: Abnormal   Collection Time: 01/15/20  4:30 AM  Result Value Ref Range   Sodium 134 (L) 135 - 145 mmol/L   Potassium 4.4 3.5 - 5.1 mmol/L   Chloride 102 98 - 111 mmol/L   CO2 24 22 - 32 mmol/L   Glucose, Bld 247 (H) 70 - 99 mg/dL    Comment: Glucose reference range applies only to samples taken after fasting for at least 8  hours.   BUN 31 (H) 8 - 23 mg/dL   Creatinine, Ser 0.92 0.44 - 1.00 mg/dL   Calcium 8.2 (L) 8.9 - 10.3 mg/dL   Total Protein 4.6 (L) 6.5 - 8.1 g/dL   Albumin 1.8 (L) 3.5 - 5.0 g/dL   AST 25 15 - 41 U/L   ALT 23 0 - 44 U/L   Alkaline Phosphatase 136 (H) 38 - 126 U/L   Total Bilirubin 0.5 0.3 - 1.2 mg/dL   GFR calc non Af Amer >60 >60 mL/min   GFR calc Af Amer >60 >60 mL/min   Anion gap 8 5 - 15    Comment: Performed at [facility] [address] [place], [place] [address]  Type and screen [place]     Status: None   Collection Time: 01/15/20  5:32 AM  Result Value Ref Range   ABO/RH(D) B POS  Antibody Screen NEG    Sample Expiration      01/18/2020,2359 Performed at [facility], [place] [address] [place], [place] [address]   Glucose, capillary     Status: Abnormal   Collection Time: 01/15/20  8:21 AM  Result Value Ref Range   Glucose-Capillary 262 (H) 70 - 99 mg/dL    Comment: Glucose reference range applies only to samples taken after fasting for at least 8 hours.   Comment 1 Notify RN    Comment 2 Document in Chart     Studies/Results: No results found.  Medications: I have reviewed the patient's current medications.  Assessment: Severe acute pancreatitis with a large pancreatic necrotic collection Awaiting transfer to [facility] for cystogastrostomy  Hemoglobin dropped today to 6.9 without obvious melena or hematochezia, it was 7.2 yesterday  No leukocytosis  Plan: Patient will benefit from 1 unit PRBC transfusion, this can be done here while she is waiting bed placement at [facility], unless she will be transfer to [facility] today. Patient wants to remain n.p.o. and continue nasojejunal feedings while being maintained on Ringer's lactate at 50 cc an hour Pain seems to be adequately controlled She is known to have multiple superficial gastric ulcers and erosive gastritis, had a few episodes of coffee-ground emesis and small amount of bloody emesis  few days ago, is maintained on pantoprazole 40 mg twice daily.  Ronnette Juniper, MD 01/15/2020, 9:10 AM

## 2020-01-15 NOTE — Progress Notes (Signed)
Discharge papers placed in packet and sent with patient via carelink. Husband at bedside. All questions answered.Telemetry discontinued. Cortrak left in place. Nurse contacted IR to request [facility] having access to patients IR results. Nurse called report to Drucie Opitz at [facility].  Gwendolyn Grant, RN

## 2020-01-15 NOTE — Progress Notes (Signed)
PROGRESS NOTE    Katherine Oliver  RKY:[id number] DOB: 14-Nov-1945 DOA: 01/07/2020 PCP: [name], MD    Brief Narrative:  74 year old female with history of diabetes, prior cholecystectomy in 2003, recent extensive hospitalization due to choledocholithiasis and necrotic pancreatitis, renal failure requiring hemodialysis who was discharged 2 days ago with planned outpatient follow-up.  She was diagnosed with necrotizing pancreatitis, underwent ERCP and stenting, had protracted hospitalization.  Continue to have poor appetite and abdominal pain so presented back to the [facility] hospital. In the emergency room, hemodynamically stable, repeat imaging studies showed increasing necrotic collection in the pancreas that replaced entire pancreas.  Also showed intraperitoneal and retroperitoneal phlegmonous changes. Pt admitted for further management.    Assessment & Plan:   Principal Problem:   Acute necrotizing pancreatitis Active Problems:   HTN (hypertension)   AKI (acute kidney injury) (HCC)   AF (paroxysmal atrial fibrillation) (HCC)   Acute pancreatitis   Pancreatitis  Acute necrotizing pancreatitiswithpancreatic cyst formation andintraperitoneal and retroperitoneal rim enhancing lesions Currently afebrile LFTsremainstable, with low protein and albumin, suggesting malnutrition Due to significant poor nutrition, postpyloric tube placementwas done Tube feeding continued per GI, continues to tolerate thus far Concerns for large pancreatic fluid collection with plan for transfer to [facility] for eval by advanced endoscopist Plan for transfer to [facility] today  GI on board,discussed with Dr. Therisa Doyne on 01/11/2020. With acute hematemesis, have ordered CT abd/pelvis with IV contrast Dr. Horris Latino discussed with surgerywhosuggested no intervention. Dr. Rushie Chestnut discussed with interventional radiology, suggested no external drainage.    Initially on PCA analgesia, now symptoms better controlled with scheduled dilaudid per Palliative Care recs Continued on empiricIV meropenem  AKI Labs reviewed, slowly improving Required dialysis brieflylast admission Had declined HD initially, on discussion with GI, pt had agreed to HD again only if needed Cr remains stable currently  Normocytic anemia/chronic diseasewith acute blood loss anemia Hemoglobin continues to be around baseline Large hematemesis noted the AM of 3/10. Discussed with GI. Concern for bleeding from stress ulcer seen on EGD one month prior Now on protonix gtt.  -Hgb currently 7.0, no obvious bleeding. Would transfuse for hgb <7  Type 2 diabetesmellitus Last A1c 6.4 on 12/14/2019 Noted hyperglycemia Now onLantus, SSI(adjust accordingly),continue withAccu-Cheks, hypoglycemic protocol Holdinghome Metformin while pt in hospital  Hypertension Remains stable currently Norvasc currently remains on hold  Paroxysmal A. Fib Currently rate controlled on sinus rhythm Not on anticoagulation Currently off amiodarone as pt is NPO, continue scheduled IV metoprolol for rate control  GERD Continue PPIas tolerated  Obesity Lifestyle modification advised  Hypoxia Pt had earlier required 5LNC CT abd/pelvis reviewed, findings suggesting compressive atelectasis noted as well as some anasarca Given trial of IV lasix with improvement in O2 requirement Currently appears comfortable on [facility]  Goals of care Patient with very guarded prognosis Palliative had been following. Full Code  DVT prophylaxis: SCD's Code Status: Full Family Communication: Pt in room, family not at bedside Disposition Plan: Transfer to [facility]  Consultants:   GI  IR General Surgery  Palliative Care  Procedures:     Antimicrobials: Anti-infectives (From admission, onward)   Start     Dose/Rate Route Frequency Ordered Stop   01/13/20 1600   meropenem (MERREM) 1 g in sodium chloride 0.9 % 100 mL IVPB     1 g 200 mL/hr over 30 Minutes Intravenous Every 8 hours 01/13/20 1102     01/12/20 0000  meropenem 1 g in sodium  chloride 0.9 % 100 mL     1 g Intravenous Every 12 hours 01/12/20 1548     01/10/20 1115  meropenem (MERREM) 1 g in sodium chloride 0.9 % 100 mL IVPB     1 g 200 mL/hr over 30 Minutes Intravenous Every 12 hours 01/10/20 1007 01/13/20 1153   01/07/20 1800  fluconazole (DIFLUCAN) tablet 150 mg     150 mg Oral  Once 01/07/20 1610 01/07/20 1757   01/07/20 0200  meropenem (MERREM) 500 mg in sodium chloride 0.9 % 100 mL IVPB  Status:  Discontinued     500 mg 200 mL/hr over 30 Minutes Intravenous Every 12 hours 01/07/20 0145 01/09/20 1345      Subjective: Eager to transfer to [facility]  Objective: Vitals:   01/15/20 0416 01/15/20 0420 01/15/20 0728 01/15/20 1145  BP: (!) 152/71  (!) 151/69 (!) 153/76  Pulse: 77  96 98  Resp: 18  20   Temp: 98 F (36.7 C)   97.6 F (36.4 C)  TempSrc: Oral   Oral  SpO2: 98%  97% 98%  Weight:  92.1 kg    Height:        Intake/Output Summary (Last 24 hours) at 01/15/2020 1357 Last data filed at 01/14/2020 1404 Gross per 24 hour  Intake 100 ml  Output --  Net 100 ml   Filed Weights   01/12/20 0500 01/14/20 0333 01/15/20 0420  Weight: 88.6 kg 88.4 kg 92.1 kg    Examination: General exam: Conversant, in no acute distress Respiratory system: normal chest rise, clear, no audible wheezing Cardiovascular system: regular rhythm, s1-s2 Gastrointestinal system: Distended, decreased BS Central nervous system: No seizures, no tremors Extremities: No cyanosis, no joint deformities Skin: No rashes, no pallor Psychiatry: Affect normal // no auditory hallucinations   Data Reviewed: I have personally reviewed following labs and imaging studies  CBC: Recent Labs  Lab 01/11/20 0440 01/11/20 0955 01/12/20 0512 01/13/20 0422 01/14/20 0405 01/15/20 0430 01/15/20 1010  WBC 11.2*  --   11.7* 13.0* 14.0* 9.8  --   NEUTROABS 9.3*  --  10.8* 9.3* 10.7* 7.3  --   HGB 7.6*   < > 7.2* 7.5* 7.2* 6.9* 7.0*  HCT 24.4*   < > 23.2* 24.6* 23.6* 22.1* 22.8*  MCV 86.5  --  86.2 88.2 87.4 85.7  --   PLT 293  --  274 282 233 196  --    < > = values in this interval not displayed.   Basic Metabolic Panel: Recent Labs  Lab 01/09/20 0202 01/09/20 0202 01/10/20 0234 01/10/20 0234 01/11/20 0440 01/12/20 0512 01/13/20 0422 01/14/20 0405 01/15/20 0430  NA 133*   < > 134*   < > 134* 137 141 135 134*  K 3.5   < > 3.9   < > 4.0 4.1 4.0 4.2 4.4  CL 102   < > 102   < > 101 103 103 102 102  CO2 22   < > 21*   < > 23 23 24  21* 24  GLUCOSE 192*   < > 329*   < > 308* 204* 163* 257* 247*  BUN 22   < > 21   < > 24* 27* 29* 31* 31*  CREATININE 1.50*   < > 1.46*   < > 1.35* 1.00 0.97 0.99 0.92  CALCIUM 8.5*   < > 8.6*   < > 8.8* 8.6* 9.2 8.7* 8.2*  MG 1.9  --  1.7  --   --   --   --   --   --  PHOS 2.3*  --  2.7  --   --   --   --   --   --    < > = values in this interval not displayed.   GFR: Estimated Creatinine Clearance: 54.6 mL/min (by C-G formula based on SCr of 0.92 mg/dL). Liver Function Tests: Recent Labs  Lab 01/11/20 0440 01/12/20 0512 01/13/20 0422 01/14/20 0405 01/15/20 0430  AST 25 19 51* 25 25  ALT 17 16 32 29 23  ALKPHOS 134* 101 146* 138* 136*  BILITOT 0.5 0.5 0.7 0.4 0.5  PROT 5.5* 4.8* 5.3* 5.3* 4.6*  ALBUMIN 2.2* 2.0* 2.1* 2.2* 1.8*   No results for input(s): LIPASE, AMYLASE in the last 168 hours. No results for input(s): AMMONIA in the last 168 hours. Coagulation Profile: No results for input(s): INR, PROTIME in the last 168 hours. Cardiac Enzymes: No results for input(s): CKTOTAL, CKMB, CKMBINDEX, TROPONINI in the last 168 hours. BNP (last 3 results) No results for input(s): PROBNP in the last 8760 hours. HbA1C: No results for input(s): HGBA1C in the last 72 hours. CBG: Recent Labs  Lab 01/14/20 1950 01/14/20 2332 01/15/20 0415 01/15/20 0821  01/15/20 1123  GLUCAP 280* 202* 231* 262* 219*   Lipid Profile: No results for input(s): CHOL, HDL, LDLCALC, TRIG, CHOLHDL, LDLDIRECT in the last 72 hours. Thyroid Function Tests: No results for input(s): TSH, T4TOTAL, FREET4, T3FREE, THYROIDAB in the last 72 hours. Anemia Panel: No results for input(s): VITAMINB12, FOLATE, FERRITIN, TIBC, IRON, RETICCTPCT in the last 72 hours. Sepsis Labs: No results for input(s): PROCALCITON, LATICACIDVEN in the last 168 hours.  Recent Results (from the past 240 hour(s))  Respiratory Panel by RT PCR (Flu A&B, Covid) - Nasopharyngeal Swab     Status: None   Collection Time: 01/06/20  5:16 PM   Specimen: Nasopharyngeal Swab  Result Value Ref Range Status   SARS Coronavirus 2 by RT PCR NEGATIVE NEGATIVE Final    Comment: (NOTE) SARS-CoV-2 target nucleic acids are NOT DETECTED. The SARS-CoV-2 RNA is generally detectable in upper respiratoy specimens during the acute phase of infection. The lowest concentration of SARS-CoV-2 viral copies this assay can detect is 131 copies/mL. A negative result does not preclude SARS-Cov-2 infection and should not be used as the sole basis for treatment or other patient management decisions. A negative result may occur with  improper specimen collection/handling, submission of specimen other than nasopharyngeal swab, presence of viral mutation(s) within the areas targeted by this assay, and inadequate number of viral copies (<131 copies/mL). A negative result must be combined with clinical observations, patient history, and epidemiological information. The expected result is Negative. Fact Sheet for Patients:  PinkCheek.be Fact Sheet for Healthcare Providers:  GravelBags.it This test is not yet ap proved or cleared by the [place] FDA and  has been authorized for detection and/or diagnosis of SARS-CoV-2 by FDA under an Emergency Use Authorization (EUA).  This EUA will remain  in effect (meaning this test can be used) for the duration of the COVID-19 declaration under Section 564(b)(1) of the Act, 21 U.S.C. section 360bbb-3(b)(1), unless the authorization is terminated or revoked sooner.    Influenza A by PCR NEGATIVE NEGATIVE Final   Influenza B by PCR NEGATIVE NEGATIVE Final    Comment: (NOTE) The Xpert Xpress SARS-CoV-2/FLU/RSV assay is intended as an aid in  the diagnosis of influenza from Nasopharyngeal swab specimens and  should not be used as a sole basis for treatment. Nasal washings and  aspirates are  unacceptable for Xpert Xpress SARS-CoV-2/FLU/RSV  testing. Fact Sheet for Patients: PinkCheek.be Fact Sheet for Healthcare Providers: GravelBags.it This test is not yet approved or cleared by the [place] FDA and  has been authorized for detection and/or diagnosis of SARS-CoV-2 by  FDA under an Emergency Use Authorization (EUA). This EUA will remain  in effect (meaning this test can be used) for the duration of the  Covid-19 declaration under Section 564(b)(1) of the Act, 21  U.S.C. section 360bbb-3(b)(1), unless the authorization is  terminated or revoked. Performed at [facility], [address], [place], [place] [address]      Radiology Studies: No results found.  Scheduled Meds: . sodium chloride   Intravenous Once  .  HYDROmorphone (DILAUDID) injection  0.5 mg Intravenous Q6H  . insulin aspart  0-15 Units Subcutaneous Q4H  . insulin glargine  10 Units Subcutaneous Daily  . metoprolol tartrate  5 mg Intravenous Q6H  . nystatin cream   Topical BID  . pantoprazole (PROTONIX) IV  40 mg Intravenous Q12H  . sodium chloride flush  10-40 mL Intracatheter Q12H   Continuous Infusions: . feeding supplement (VITAL AF 1.2 CAL) 1,000 mL (01/13/20 2055)  . lactated ringers 50 mL/hr at 01/15/20 0600  . meropenem (MERREM) IV 1 g (01/15/20 0511)     LOS:  8 days   Marylu Lund, MD [facility] Pager On Amion  If 7PM-7AM, please contact night-coverage 01/15/2020, 1:57 PM

## 2020-01-16 ENCOUNTER — Other Ambulatory Visit: Payer: Self-pay | Admitting: *Deleted

## 2020-01-16 ENCOUNTER — Encounter: Payer: Self-pay | Admitting: *Deleted

## 2020-01-16 MED ORDER — INSULIN GLARGINE 100 UNIT/ML ~~LOC~~ SOLN
10.00 | SUBCUTANEOUS | Status: DC
Start: 2020-01-20 — End: 2020-01-16

## 2020-01-16 MED ORDER — HYDROMORPHONE HCL 1 MG/ML IJ SOLN
0.25 | INTRAMUSCULAR | Status: DC
Start: ? — End: 2020-01-16

## 2020-01-16 MED ORDER — INSULIN REGULAR HUMAN 100 UNIT/ML IJ SOLN
0.00 | INTRAMUSCULAR | Status: DC
Start: 2020-01-19 — End: 2020-01-16

## 2020-01-16 MED ORDER — AMLODIPINE BESYLATE 5 MG PO TABS
5.00 | ORAL_TABLET | ORAL | Status: DC
Start: 2020-01-18 — End: 2020-01-16

## 2020-01-16 MED ORDER — ONDANSETRON HCL 4 MG/2ML IJ SOLN
4.00 | INTRAMUSCULAR | Status: DC
Start: ? — End: 2020-01-16

## 2020-01-16 MED ORDER — PANTOPRAZOLE SODIUM 40 MG PO TBEC
40.00 | DELAYED_RELEASE_TABLET | ORAL | Status: DC
Start: 2020-01-27 — End: 2020-01-16

## 2020-01-16 MED ORDER — DEXTROSE 10 % IV SOLN
50.00 | INTRAVENOUS | Status: DC
Start: ? — End: 2020-01-16

## 2020-01-16 MED ORDER — ACETAMINOPHEN 325 MG PO TABS
650.00 | ORAL_TABLET | ORAL | Status: DC
Start: 2020-01-27 — End: 2020-01-16

## 2020-01-16 MED ORDER — GENERIC EXTERNAL MEDICATION
500.00 | Status: DC
Start: 2020-02-06 — End: 2020-01-16

## 2020-01-16 MED ORDER — MULTI-VITAMIN PO TABS
1.00 | ORAL_TABLET | ORAL | Status: DC
Start: 2020-02-25 — End: 2020-01-16

## 2020-01-16 MED ORDER — GLUCAGON (RDNA) 1 MG IJ KIT
1.00 | PACK | INTRAMUSCULAR | Status: DC
Start: ? — End: 2020-01-16

## 2020-01-16 MED ORDER — DEXTROSE 50 % IV SOLN
12.50 | INTRAVENOUS | Status: DC
Start: ? — End: 2020-01-16

## 2020-01-16 MED ORDER — LIDOCAINE HCL 1 % IJ SOLN
0.50 | INTRAMUSCULAR | Status: DC
Start: ? — End: 2020-01-16

## 2020-01-16 MED ORDER — OXYCODONE HCL 5 MG PO TABS
2.50 | ORAL_TABLET | ORAL | Status: DC
Start: ? — End: 2020-01-16

## 2020-01-16 MED ORDER — HEPARIN SODIUM (PORCINE) 5000 UNIT/ML IJ SOLN
5000.00 | INTRAMUSCULAR | Status: DC
Start: 2020-01-17 — End: 2020-01-16

## 2020-01-16 MED ORDER — GENERIC EXTERNAL MEDICATION
6.25 | Status: DC
Start: ? — End: 2020-01-16

## 2020-01-16 MED ORDER — PANCRELIPASE (LIP-PROT-AMYL) 24000-76000 UNITS PO CPEP
1.00 | ORAL_CAPSULE | ORAL | Status: DC
Start: 2020-02-10 — End: 2020-01-16

## 2020-01-16 MED ORDER — GENERIC EXTERNAL MEDICATION
6.25 | Status: DC
Start: 2020-01-17 — End: 2020-01-16

## 2020-01-16 NOTE — Patient Instructions (Signed)
Erroneous Encounter.  Nat Christen, BSW, MSW, LCSW  Licensed Education officer, environmental Health System  [address]ailing [place] [address] [place], [place] [address] Physical Address-[address], [place], [place] [address] Toll Free Main # [phone] Fax # [phone] Cell # [phone]  Office # [phone] [email or web address]

## 2020-01-16 NOTE — Patient Outreach (Signed)
[facility]) Care Management  01/16/2020  Katherine Oliver June 09, 1946 [id number]   CSW will perform a case closure on patient, due to patient being hospitalized greater than 10 days, per [facility] Management protocol.  CSW noted that patient was transferred to [facility] yesterday, Sunday, January 15, 2020 to receive further treatment of Necrotizing Pancreatitis.  CSW will notify patient's RNCM with [facility] Management, Reginia Naas of CSW's plans to close patient's case.  CSW will fax an update to patient's Primary Care Physician, Dr. [name] to ensure that he is aware of CSW's attempts at involvement with patient's plan of care.   Nat Christen, BSW, MSW, LCSW  Licensed Education officer, environmental Health System  Mailing [place] [address], [place], [place] [address] Physical Address-[address], [place],  [address] Toll Free Main # [phone] Fax # [phone] Cell # [phone]  Office # [phone] [email or web address]

## 2020-01-17 ENCOUNTER — Other Ambulatory Visit: Payer: Self-pay | Admitting: *Deleted

## 2020-01-17 ENCOUNTER — Ambulatory Visit: Payer: Self-pay | Admitting: *Deleted

## 2020-01-17 MED ORDER — FUROSEMIDE 10 MG/ML IJ SOLN
80.00 | INTRAMUSCULAR | Status: DC
Start: 2020-01-18 — End: 2020-01-17

## 2020-01-17 NOTE — Patient Outreach (Signed)
[place] [facility]) Care Management [place] Telephone Outreach- Case closure/ documentation only  01/17/2020  Zierra Laroque [facility] [birth year] [id number]  [facility] CM Case Closure note re:  Katherine Oliver, 74 y/o female referred to [facility] by [facility] Liaison during extended inpatient hospitalization February 9- January 03, 2020 for pancreatitis related to gallstones/ UTI; patient had multiple complications during hospitalization, including sepsis, new onset A-Fib with RVR; GI bleeding; and acute renal failure requiring hemodialysis. Patient was discharged home to self care with home health services in place ([name]) for PT.Patient has history including, but not limited to, DM-II; HTN/ HLD; GERD; and CKD-III.  Patient was engaged in [place] services on Thursday, January 05, 2020 after her hospital discharge; at that time, patient noted to have multiple clinical concerns post-hospital discharge on January 03, 2020. Patient had office visit with PCP on Friday, January 06, 2020 and was advised to return to the ED, where she was re-admitted for ongoing clinical concerns and was diagnosed with acute necrotizing pancreatitis; she was sent urgently by [facility] Link from ED at [facility] back to [facility] and was subsequently transferred to [facility], where she remains admitted, with pending plans for surgery.  Plan:  Will close [facility] CM case, as patient has been hospitalized > 10 days; happy to re-engage with patient at time of discharge home from current hospitalization at [facility], [place], BSN, [place] Care Management  [phone]

## 2020-01-19 MED ORDER — MAGNESIUM OXIDE 400 MG PO TABS
400.00 | ORAL_TABLET | ORAL | Status: DC
Start: 2020-02-15 — End: 2020-01-19

## 2020-01-19 MED ORDER — HYDROMORPHONE HCL 1 MG/ML IJ SOLN
0.25 | INTRAMUSCULAR | Status: DC
Start: ? — End: 2020-01-19

## 2020-01-19 MED ORDER — GENERIC EXTERNAL MEDICATION
12.50 | Status: DC
Start: 2020-01-19 — End: 2020-01-19

## 2020-01-19 MED ORDER — FUROSEMIDE 10 MG/ML IJ SOLN
40.00 | INTRAMUSCULAR | Status: DC
Start: 2020-01-20 — End: 2020-01-19

## 2020-01-19 MED ORDER — LACTATED RINGERS IV SOLN
INTRAVENOUS | Status: DC
Start: ? — End: 2020-01-19

## 2020-01-19 MED ORDER — GENERIC EXTERNAL MEDICATION
5.00 | Status: DC
Start: ? — End: 2020-01-19

## 2020-01-20 ENCOUNTER — Other Ambulatory Visit: Payer: Self-pay | Admitting: *Deleted

## 2020-01-20 NOTE — Patient Outreach (Signed)
[facility]) Care Management [place] [place] CM Multidisciplinary Case Conference 01/20/2020  Katherine [facility] 1946-09-07 [id number]   [facility] CM Multidisciplinary Case Conference re:  Katherine Oliver, 74 y/o female referred to [facility] by [facility] Liaison during extended inpatient hospitalization February 9- January 03, 2020 for pancreatitis related to gallstones/ UTI; patient had multiple complications during hospitalization, including sepsis, new onset A-Fib with RVR; GI bleeding; and acute renal failure requiring hemodialysis. Patient was discharged home to self care with home health services in place ([name]) for PT, after refusing SNF/ rehabilitation placement.Patient has history including, but not limited to, DM-II; HTN/ HLD; GERD; and CKD-III.  Patient went to the ED on January 04, 2020 and eventually departed [facility] due to long wait and stated poor experience.  Patient was engaged in [facility] services on Thursday, January 05, 2020 after her [facility] discharge; at that time, patient noted to have multiple clinical concerns post-Oliver discharge on January 03, 2020. Once home, patient contacted care providers immediately with her ongoing clinical concerns and was advised to return to the ED for further evaluation/ treatment.  Patient had office visit with PCP on Friday, January 06, 2020 and was again advised to return to the ED, where she was re-admitted for ongoing clinical concerns and was diagnosed with acute necrotizing pancreatitis; she was sent urgently by Care Link from ED at [facility] back to [facility] and was subsequently transferred to [facility] January 15, 2020, where she remains admitted.  While at [facility], she has had ERCP (01/18/20) for acute necrotizing pancreatitis vs. pancreatic pseudocyst and has experienced multiple complications, including A-Fib with RVR (rate up to 170's); acute respiratory failure and pulmonary edema.  Noted patient scheduled for  endoscopy/ bronchoscopy today; current plan pending for SNF discharge  Barriers: -- complicated initial Oliver course with [facility] readmission leading to readmission with ongoing clinical complications, in patient with relatively good state of health prior to hospitalization -- patient fearfulness around health care system- subsequent patient reticence in seeking care   [facility] CM Team Discussion:  Question clinical appropriateness around initial discharge; patient and her supportive spouse were provided appropriate phone numbers to follow up on their concerns around discharge; patient plan appears to be discharge to SNF post- current discharge from [facility]:  Will send FYI to [place] leadership team and to Patient Experience Department  Will continue to follow patient as indicated once she is discharged from [facility]/ SNF  [name], RN, BSN, [place] Coordinator [facility] Care Management  [phone]

## 2020-01-25 ENCOUNTER — Encounter: Payer: Self-pay | Admitting: Specialist

## 2020-01-25 MED ORDER — INSULIN GLARGINE 100 UNIT/ML ~~LOC~~ SOLN
12.00 | SUBCUTANEOUS | Status: DC
Start: 2020-01-28 — End: 2020-01-25

## 2020-01-25 MED ORDER — VANCOMYCIN HCL 50 MG/ML PO SOLR
125.00 | ORAL | Status: DC
Start: 2020-01-27 — End: 2020-01-25

## 2020-01-25 MED ORDER — HEPARIN SOD (PORCINE) IN D5W 100 UNIT/ML IV SOLN
1100.00 | INTRAVENOUS | Status: DC
Start: ? — End: 2020-01-25

## 2020-01-25 MED ORDER — LACTATED RINGERS IV SOLN
INTRAVENOUS | Status: DC
Start: ? — End: 2020-01-25

## 2020-01-25 MED ORDER — DILTIAZEM HCL 60 MG PO TABS
60.00 | ORAL_TABLET | ORAL | Status: DC
Start: 2020-01-31 — End: 2020-01-25

## 2020-01-25 MED ORDER — INSULIN LISPRO 100 UNIT/ML ~~LOC~~ SOLN
0.00 | SUBCUTANEOUS | Status: DC
Start: 2020-02-08 — End: 2020-01-25

## 2020-01-27 MED ORDER — INSULIN LISPRO 100 UNIT/ML ~~LOC~~ SOLN
6.00 | SUBCUTANEOUS | Status: DC
Start: 2020-02-10 — End: 2020-01-27

## 2020-01-27 MED ORDER — DEXTROSE 50 % IV SOLN
12.50 | INTRAVENOUS | Status: DC
Start: ? — End: 2020-01-27

## 2020-01-29 MED ORDER — GENERIC EXTERNAL MEDICATION
6.25 | Status: DC
Start: ? — End: 2020-01-29

## 2020-01-29 MED ORDER — INSULIN GLARGINE 100 UNIT/ML ~~LOC~~ SOLN
13.00 | SUBCUTANEOUS | Status: DC
Start: 2020-02-11 — End: 2020-01-29

## 2020-01-29 MED ORDER — ACETAMINOPHEN 325 MG PO TABS
975.00 | ORAL_TABLET | ORAL | Status: DC
Start: ? — End: 2020-01-29

## 2020-01-29 MED ORDER — GENERIC EXTERNAL MEDICATION
40.00 | Status: DC
Start: 2020-01-31 — End: 2020-01-29

## 2020-01-29 MED ORDER — VANCOMYCIN HCL 50 MG/ML PO SOLR
500.00 | ORAL | Status: DC
Start: 2020-02-08 — End: 2020-01-29

## 2020-01-29 MED ORDER — LIDOCAINE HCL 1 % IJ SOLN
3.00 | INTRAMUSCULAR | Status: DC
Start: ? — End: 2020-01-29

## 2020-01-29 MED ORDER — OXYCODONE HCL 5 MG PO TABS
2.50 | ORAL_TABLET | ORAL | Status: DC
Start: ? — End: 2020-01-29

## 2020-01-29 MED ORDER — HYDROMORPHONE HCL 1 MG/ML IJ SOLN
0.25 | INTRAMUSCULAR | Status: DC
Start: ? — End: 2020-01-29

## 2020-01-31 MED ORDER — BACITRACIN-NEOMYCIN-POLYMYXIN 400-5-5000 EX OINT
TOPICAL_OINTMENT | CUTANEOUS | Status: DC
Start: 2020-01-31 — End: 2020-01-31

## 2020-02-02 ENCOUNTER — Telehealth: Payer: Self-pay | Admitting: Family Medicine

## 2020-02-02 MED ORDER — POLYETHYLENE GLYCOL 3350 17 GM/SCOOP PO POWD
17.00 | ORAL | Status: DC
Start: 2020-02-16 — End: 2020-02-02

## 2020-02-02 MED ORDER — SENNOSIDES-DOCUSATE SODIUM 8.6-50 MG PO TABS
2.00 | ORAL_TABLET | ORAL | Status: DC
Start: 2020-02-10 — End: 2020-02-02

## 2020-02-02 MED ORDER — PANTOPRAZOLE SODIUM 40 MG PO TBEC
40.00 | DELAYED_RELEASE_TABLET | ORAL | Status: DC
Start: 2020-02-15 — End: 2020-02-02

## 2020-02-02 MED ORDER — HEPARIN SOD (PORCINE) IN D5W 100 UNIT/ML IV SOLN
1150.00 | INTRAVENOUS | Status: DC
Start: ? — End: 2020-02-02

## 2020-02-02 MED ORDER — DILTIAZEM HCL 90 MG PO TABS
90.00 | ORAL_TABLET | ORAL | Status: DC
Start: 2020-02-15 — End: 2020-02-02

## 2020-02-02 NOTE — Telephone Encounter (Signed)
Spoke with patient and husband, to let them know I'm thinking of them and following along.

## 2020-02-06 ENCOUNTER — Encounter: Payer: Self-pay | Admitting: Family Medicine

## 2020-02-06 MED ORDER — MELATONIN 3 MG PO TABS
3.00 | ORAL_TABLET | ORAL | Status: DC
Start: ? — End: 2020-02-06

## 2020-02-06 MED ORDER — MELATONIN 3 MG PO TABS
3.00 | ORAL_TABLET | ORAL | Status: DC
Start: 2020-02-24 — End: 2020-02-06

## 2020-02-06 MED ORDER — FUROSEMIDE 10 MG/ML IJ SOLN
40.00 | INTRAMUSCULAR | Status: DC
Start: 2020-02-06 — End: 2020-02-06

## 2020-02-06 MED ORDER — FLUTICASONE PROPIONATE 50 MCG/ACT NA SUSP
1.00 | NASAL | Status: DC
Start: 2020-02-24 — End: 2020-02-06

## 2020-02-08 MED ORDER — GENERIC EXTERNAL MEDICATION
2.00 | Status: DC
Start: 2020-02-17 — End: 2020-02-08

## 2020-02-08 MED ORDER — FUROSEMIDE 10 MG/ML IJ SOLN
80.00 | INTRAMUSCULAR | Status: DC
Start: 2020-02-11 — End: 2020-02-08

## 2020-02-08 MED ORDER — POTASSIUM CHLORIDE 10 MEQ/100ML IV SOLN
10.00 | INTRAVENOUS | Status: DC
Start: 2020-02-08 — End: 2020-02-08

## 2020-02-08 MED ORDER — METRONIDAZOLE IN NACL 5-0.79 MG/ML-% IV SOLN
500.00 | INTRAVENOUS | Status: DC
Start: 2020-02-15 — End: 2020-02-08

## 2020-02-10 DIAGNOSIS — K859 Acute pancreatitis without necrosis or infection, unspecified: Secondary | ICD-10-CM | POA: Insufficient documentation

## 2020-02-10 MED ORDER — LACTATED RINGERS IV SOLN
INTRAVENOUS | Status: DC
Start: ? — End: 2020-02-10

## 2020-02-10 MED ORDER — INSULIN LISPRO 100 UNIT/ML ~~LOC~~ SOLN
0.00 | SUBCUTANEOUS | Status: DC
Start: 2020-02-10 — End: 2020-02-10

## 2020-02-10 MED ORDER — VANCOMYCIN HCL 50 MG/ML PO SOLR
125.00 | ORAL | Status: DC
Start: 2020-02-10 — End: 2020-02-10

## 2020-02-15 ENCOUNTER — Encounter: Payer: Self-pay | Admitting: Certified Nurse Midwife

## 2020-02-15 DIAGNOSIS — K8689 Other specified diseases of pancreas: Secondary | ICD-10-CM | POA: Diagnosis not present

## 2020-02-15 DIAGNOSIS — K209 Esophagitis, unspecified without bleeding: Secondary | ICD-10-CM | POA: Diagnosis not present

## 2020-02-15 DIAGNOSIS — Z978 Presence of other specified devices: Secondary | ICD-10-CM | POA: Diagnosis not present

## 2020-02-15 MED ORDER — PANCRELIPASE (LIP-PROT-AMYL) 24000-76000 UNITS PO CPEP
1.00 | ORAL_CAPSULE | ORAL | Status: DC
Start: 2020-02-15 — End: 2020-02-15

## 2020-02-15 MED ORDER — VANCOMYCIN HCL 50 MG/ML PO SOLR
125.00 | ORAL | Status: DC
Start: 2020-02-15 — End: 2020-02-15

## 2020-02-15 MED ORDER — ACETAMINOPHEN 160 MG/5ML PO SUSP
975.00 | ORAL | Status: DC
Start: 2020-02-24 — End: 2020-02-15

## 2020-02-15 MED ORDER — INSULIN REGULAR HUMAN 100 UNIT/ML IJ SOLN
8.00 | INTRAMUSCULAR | Status: DC
Start: 2020-02-16 — End: 2020-02-15

## 2020-02-15 MED ORDER — INSULIN REGULAR HUMAN 100 UNIT/ML IJ SOLN
0.00 | INTRAMUSCULAR | Status: DC
Start: 2020-02-15 — End: 2020-02-15

## 2020-02-15 MED ORDER — INSULIN REGULAR HUMAN 100 UNIT/ML IJ SOLN
8.00 | INTRAMUSCULAR | Status: DC
Start: 2020-02-15 — End: 2020-02-15

## 2020-02-15 MED ORDER — DEXTROSE 10 % IV SOLN
50.00 | INTRAVENOUS | Status: DC
Start: ? — End: 2020-02-15

## 2020-02-15 MED ORDER — ONDANSETRON HCL 4 MG/2ML IJ SOLN
4.00 | INTRAMUSCULAR | Status: DC
Start: ? — End: 2020-02-15

## 2020-02-15 MED ORDER — INSULIN GLARGINE 100 UNIT/ML ~~LOC~~ SOLN
14.00 | SUBCUTANEOUS | Status: DC
Start: 2020-02-25 — End: 2020-02-15

## 2020-02-15 MED ORDER — OXYCODONE HCL 5 MG/5ML PO SOLN
2.50 | ORAL | Status: DC
Start: ? — End: 2020-02-15

## 2020-02-15 MED ORDER — SENNOSIDES 8.8 MG/5ML PO SYRP
10.00 | ORAL_SOLUTION | ORAL | Status: DC
Start: 2020-02-18 — End: 2020-02-15

## 2020-02-15 MED ORDER — CALCIUM CARBONATE ANTACID 750 MG PO CHEW
CHEWABLE_TABLET | ORAL | Status: DC
Start: ? — End: 2020-02-15

## 2020-02-17 MED ORDER — GENERIC EXTERNAL MEDICATION
90.00 | Status: DC
Start: 2020-02-17 — End: 2020-02-17

## 2020-02-17 MED ORDER — INSULIN REGULAR HUMAN 100 UNIT/ML IJ SOLN
13.00 | INTRAMUSCULAR | Status: DC
Start: 2020-02-18 — End: 2020-02-17

## 2020-02-17 MED ORDER — INSULIN REGULAR HUMAN 100 UNIT/ML IJ SOLN
13.00 | INTRAMUSCULAR | Status: DC
Start: 2020-02-17 — End: 2020-02-17

## 2020-02-17 MED ORDER — INSULIN REGULAR HUMAN 100 UNIT/ML IJ SOLN
0.00 | INTRAMUSCULAR | Status: DC
Start: 2020-02-24 — End: 2020-02-17

## 2020-02-17 MED ORDER — DEXTROSE 10 % IV SOLN
50.00 | INTRAVENOUS | Status: DC
Start: ? — End: 2020-02-17

## 2020-02-17 MED ORDER — ENOXAPARIN SODIUM 80 MG/0.8ML ~~LOC~~ SOLN
70.00 | SUBCUTANEOUS | Status: DC
Start: 2020-02-17 — End: 2020-02-17

## 2020-02-17 MED ORDER — VANCOMYCIN HCL 50 MG/ML PO SOLR
125.00 | ORAL | Status: DC
Start: 2020-02-17 — End: 2020-02-17

## 2020-02-17 MED ORDER — PANCRELIPASE (LIP-PROT-AMYL) 24000-76000 UNITS PO CPEP
3.00 | ORAL_CAPSULE | ORAL | Status: DC
Start: 2020-02-17 — End: 2020-02-17

## 2020-02-17 MED ORDER — GENERIC EXTERNAL MEDICATION
40.00 | Status: DC
Start: 2020-02-17 — End: 2020-02-17

## 2020-02-23 ENCOUNTER — Ambulatory Visit: Payer: Medicare Other | Admitting: Urology

## 2020-02-24 ENCOUNTER — Ambulatory Visit: Payer: Medicare Other | Admitting: Family Medicine

## 2020-02-24 ENCOUNTER — Telehealth: Payer: Self-pay

## 2020-02-24 DIAGNOSIS — K8592 Acute pancreatitis with infected necrosis, unspecified: Secondary | ICD-10-CM | POA: Diagnosis not present

## 2020-02-24 DIAGNOSIS — E118 Type 2 diabetes mellitus with unspecified complications: Secondary | ICD-10-CM | POA: Diagnosis not present

## 2020-02-24 DIAGNOSIS — E43 Unspecified severe protein-calorie malnutrition: Secondary | ICD-10-CM | POA: Diagnosis not present

## 2020-02-24 DIAGNOSIS — I700 Atherosclerosis of aorta: Secondary | ICD-10-CM | POA: Diagnosis not present

## 2020-02-24 DIAGNOSIS — Z794 Long term (current) use of insulin: Secondary | ICD-10-CM | POA: Diagnosis not present

## 2020-02-24 DIAGNOSIS — R0902 Hypoxemia: Secondary | ICD-10-CM | POA: Diagnosis not present

## 2020-02-24 DIAGNOSIS — Z931 Gastrostomy status: Secondary | ICD-10-CM | POA: Diagnosis not present

## 2020-02-24 DIAGNOSIS — N281 Cyst of kidney, acquired: Secondary | ICD-10-CM | POA: Diagnosis not present

## 2020-02-24 DIAGNOSIS — Z888 Allergy status to other drugs, medicaments and biological substances status: Secondary | ICD-10-CM | POA: Diagnosis not present

## 2020-02-24 DIAGNOSIS — Z90411 Acquired partial absence of pancreas: Secondary | ICD-10-CM | POA: Diagnosis not present

## 2020-02-24 DIAGNOSIS — R69 Illness, unspecified: Secondary | ICD-10-CM | POA: Diagnosis not present

## 2020-02-24 DIAGNOSIS — E1165 Type 2 diabetes mellitus with hyperglycemia: Secondary | ICD-10-CM | POA: Diagnosis not present

## 2020-02-24 DIAGNOSIS — I4891 Unspecified atrial fibrillation: Secondary | ICD-10-CM | POA: Diagnosis not present

## 2020-02-24 DIAGNOSIS — E1169 Type 2 diabetes mellitus with other specified complication: Secondary | ICD-10-CM | POA: Diagnosis not present

## 2020-02-24 DIAGNOSIS — Z823 Family history of stroke: Secondary | ICD-10-CM | POA: Diagnosis not present

## 2020-02-24 DIAGNOSIS — J90 Pleural effusion, not elsewhere classified: Secondary | ICD-10-CM | POA: Diagnosis not present

## 2020-02-24 DIAGNOSIS — Z882 Allergy status to sulfonamides status: Secondary | ICD-10-CM | POA: Diagnosis not present

## 2020-02-24 DIAGNOSIS — T85520A Displacement of bile duct prosthesis, initial encounter: Secondary | ICD-10-CM | POA: Diagnosis not present

## 2020-02-24 DIAGNOSIS — R5381 Other malaise: Secondary | ICD-10-CM | POA: Diagnosis not present

## 2020-02-24 DIAGNOSIS — M6281 Muscle weakness (generalized): Secondary | ICD-10-CM | POA: Diagnosis not present

## 2020-02-24 DIAGNOSIS — Z8249 Family history of ischemic heart disease and other diseases of the circulatory system: Secondary | ICD-10-CM | POA: Diagnosis not present

## 2020-02-24 DIAGNOSIS — Z96651 Presence of right artificial knee joint: Secondary | ICD-10-CM | POA: Diagnosis present

## 2020-02-24 DIAGNOSIS — E785 Hyperlipidemia, unspecified: Secondary | ICD-10-CM | POA: Diagnosis present

## 2020-02-24 DIAGNOSIS — Z8719 Personal history of other diseases of the digestive system: Secondary | ICD-10-CM | POA: Diagnosis not present

## 2020-02-24 DIAGNOSIS — I480 Paroxysmal atrial fibrillation: Secondary | ICD-10-CM | POA: Diagnosis present

## 2020-02-24 DIAGNOSIS — Z03818 Encounter for observation for suspected exposure to other biological agents ruled out: Secondary | ICD-10-CM | POA: Diagnosis not present

## 2020-02-24 DIAGNOSIS — J9601 Acute respiratory failure with hypoxia: Secondary | ICD-10-CM | POA: Diagnosis not present

## 2020-02-24 DIAGNOSIS — K861 Other chronic pancreatitis: Secondary | ICD-10-CM | POA: Diagnosis present

## 2020-02-24 DIAGNOSIS — K760 Fatty (change of) liver, not elsewhere classified: Secondary | ICD-10-CM | POA: Diagnosis present

## 2020-02-24 DIAGNOSIS — M65321 Trigger finger, right index finger: Secondary | ICD-10-CM | POA: Diagnosis not present

## 2020-02-24 DIAGNOSIS — R911 Solitary pulmonary nodule: Secondary | ICD-10-CM | POA: Diagnosis not present

## 2020-02-24 DIAGNOSIS — Z20822 Contact with and (suspected) exposure to covid-19: Secondary | ICD-10-CM | POA: Diagnosis present

## 2020-02-24 DIAGNOSIS — Z833 Family history of diabetes mellitus: Secondary | ICD-10-CM | POA: Diagnosis not present

## 2020-02-24 DIAGNOSIS — H35033 Hypertensive retinopathy, bilateral: Secondary | ICD-10-CM | POA: Diagnosis present

## 2020-02-24 DIAGNOSIS — K219 Gastro-esophageal reflux disease without esophagitis: Secondary | ICD-10-CM | POA: Diagnosis present

## 2020-02-24 DIAGNOSIS — Z83438 Family history of other disorder of lipoprotein metabolism and other lipidemia: Secondary | ICD-10-CM | POA: Diagnosis not present

## 2020-02-24 DIAGNOSIS — E1122 Type 2 diabetes mellitus with diabetic chronic kidney disease: Secondary | ICD-10-CM | POA: Diagnosis present

## 2020-02-24 DIAGNOSIS — N183 Chronic kidney disease, stage 3 unspecified: Secondary | ICD-10-CM | POA: Diagnosis present

## 2020-02-24 DIAGNOSIS — N390 Urinary tract infection, site not specified: Secondary | ICD-10-CM | POA: Diagnosis present

## 2020-02-24 DIAGNOSIS — K259 Gastric ulcer, unspecified as acute or chronic, without hemorrhage or perforation: Secondary | ICD-10-CM | POA: Diagnosis not present

## 2020-02-24 DIAGNOSIS — E119 Type 2 diabetes mellitus without complications: Secondary | ICD-10-CM | POA: Diagnosis not present

## 2020-02-24 DIAGNOSIS — Z881 Allergy status to other antibiotic agents status: Secondary | ICD-10-CM | POA: Diagnosis not present

## 2020-02-24 DIAGNOSIS — M8580 Other specified disorders of bone density and structure, unspecified site: Secondary | ICD-10-CM | POA: Diagnosis present

## 2020-02-24 DIAGNOSIS — Z7401 Bed confinement status: Secondary | ICD-10-CM | POA: Diagnosis not present

## 2020-02-24 DIAGNOSIS — Z48815 Encounter for surgical aftercare following surgery on the digestive system: Secondary | ICD-10-CM | POA: Diagnosis not present

## 2020-02-24 DIAGNOSIS — K863 Pseudocyst of pancreas: Secondary | ICD-10-CM | POA: Diagnosis not present

## 2020-02-24 DIAGNOSIS — I129 Hypertensive chronic kidney disease with stage 1 through stage 4 chronic kidney disease, or unspecified chronic kidney disease: Secondary | ICD-10-CM | POA: Diagnosis present

## 2020-02-24 DIAGNOSIS — E669 Obesity, unspecified: Secondary | ICD-10-CM | POA: Diagnosis not present

## 2020-02-24 DIAGNOSIS — D509 Iron deficiency anemia, unspecified: Secondary | ICD-10-CM | POA: Diagnosis not present

## 2020-02-24 DIAGNOSIS — I10 Essential (primary) hypertension: Secondary | ICD-10-CM | POA: Diagnosis not present

## 2020-02-24 DIAGNOSIS — Z800 Family history of malignant neoplasm of digestive organs: Secondary | ICD-10-CM | POA: Diagnosis not present

## 2020-02-24 DIAGNOSIS — R109 Unspecified abdominal pain: Secondary | ICD-10-CM | POA: Diagnosis not present

## 2020-02-24 MED ORDER — LOPERAMIDE HCL 1 MG/7.5ML PO LIQD
1.00 | ORAL | Status: DC
Start: ? — End: 2020-02-24

## 2020-02-24 MED ORDER — OXYCODONE HCL 5 MG/5ML PO SOLN
2.50 | ORAL | Status: DC
Start: ? — End: 2020-02-24

## 2020-02-24 MED ORDER — AMLODIPINE BENZOATE 1 MG/ML PO SUSP
2.50 | ORAL | Status: DC
Start: 2020-02-25 — End: 2020-02-24

## 2020-02-24 MED ORDER — ENOXAPARIN SODIUM 80 MG/0.8ML ~~LOC~~ SOLN
70.00 | SUBCUTANEOUS | Status: DC
Start: 2020-02-24 — End: 2020-02-24

## 2020-02-24 MED ORDER — BACITRACIN-NEOMYCIN-POLYMYXIN 400-5-5000 EX OINT
TOPICAL_OINTMENT | CUTANEOUS | Status: DC
Start: ? — End: 2020-02-24

## 2020-02-24 MED ORDER — PROMETHAZINE HCL 6.25 MG/5ML PO SYRP
6.25 | ORAL_SOLUTION | ORAL | Status: DC
Start: ? — End: 2020-02-24

## 2020-02-24 MED ORDER — PANCRELIPASE (LIP-PROT-AMYL) 20880-78300 UNITS PO TABS
2.00 | ORAL_TABLET | ORAL | Status: DC
Start: 2020-02-24 — End: 2020-02-24

## 2020-02-24 MED ORDER — GENERIC EXTERNAL MEDICATION
12.50 | Status: DC
Start: 2020-02-25 — End: 2020-02-24

## 2020-02-24 MED ORDER — GENERIC EXTERNAL MEDICATION
20.00 | Status: DC
Start: 2020-02-25 — End: 2020-02-24

## 2020-02-24 MED ORDER — INSULIN REGULAR HUMAN 100 UNIT/ML IJ SOLN
8.00 | INTRAMUSCULAR | Status: DC
Start: 2020-02-25 — End: 2020-02-24

## 2020-02-24 MED ORDER — ONDANSETRON HCL 4 MG/5ML PO SOLN
8.00 | ORAL | Status: DC
Start: ? — End: 2020-02-24

## 2020-02-24 MED ORDER — INSULIN REGULAR HUMAN 100 UNIT/ML IJ SOLN
8.00 | INTRAMUSCULAR | Status: DC
Start: 2020-02-24 — End: 2020-02-24

## 2020-02-24 MED ORDER — GENERIC EXTERNAL MEDICATION
90.00 | Status: DC
Start: 2020-02-24 — End: 2020-02-24

## 2020-02-24 MED ORDER — BENAZEPRIL HCL 20 MG PO TABS
20.00 | ORAL_TABLET | ORAL | Status: DC
Start: 2020-02-25 — End: 2020-02-24

## 2020-02-24 MED ORDER — ENOXAPARIN SODIUM 60 MG/0.6ML ~~LOC~~ SOLN
1.00 | SUBCUTANEOUS | Status: DC
Start: 2020-02-24 — End: 2020-02-24

## 2020-02-24 NOTE — Telephone Encounter (Signed)
Pt's husband is here for OV today.  He states pt is still at [facility] but should be moved to rehab today or tomorrow.

## 2020-02-24 NOTE — Telephone Encounter (Signed)
Thanks. Spoke with patient's husband today.

## 2020-02-27 DIAGNOSIS — K219 Gastro-esophageal reflux disease without esophagitis: Secondary | ICD-10-CM | POA: Diagnosis not present

## 2020-02-27 DIAGNOSIS — E1169 Type 2 diabetes mellitus with other specified complication: Secondary | ICD-10-CM | POA: Diagnosis not present

## 2020-02-27 DIAGNOSIS — Z48815 Encounter for surgical aftercare following surgery on the digestive system: Secondary | ICD-10-CM | POA: Diagnosis not present

## 2020-02-27 DIAGNOSIS — E669 Obesity, unspecified: Secondary | ICD-10-CM | POA: Diagnosis not present

## 2020-03-06 ENCOUNTER — Encounter: Payer: Self-pay | Admitting: Emergency Medicine

## 2020-03-06 ENCOUNTER — Inpatient Hospital Stay
Admission: EM | Admit: 2020-03-06 | Discharge: 2020-03-13 | DRG: 690 | Disposition: A | Discharge status: Skilled Nursing Facility | Payer: Medicare Other | Source: Skilled Nursing Facility | Attending: Internal Medicine | Admitting: Internal Medicine

## 2020-03-06 ENCOUNTER — Other Ambulatory Visit: Payer: Self-pay

## 2020-03-06 DIAGNOSIS — E1169 Type 2 diabetes mellitus with other specified complication: Secondary | ICD-10-CM | POA: Diagnosis present

## 2020-03-06 DIAGNOSIS — Z888 Allergy status to other drugs, medicaments and biological substances status: Secondary | ICD-10-CM

## 2020-03-06 DIAGNOSIS — E785 Hyperlipidemia, unspecified: Secondary | ICD-10-CM | POA: Diagnosis present

## 2020-03-06 DIAGNOSIS — Z794 Long term (current) use of insulin: Secondary | ICD-10-CM

## 2020-03-06 DIAGNOSIS — Z90411 Acquired partial absence of pancreas: Secondary | ICD-10-CM | POA: Diagnosis not present

## 2020-03-06 DIAGNOSIS — K219 Gastro-esophageal reflux disease without esophagitis: Secondary | ICD-10-CM | POA: Diagnosis present

## 2020-03-06 DIAGNOSIS — Z48815 Encounter for surgical aftercare following surgery on the digestive system: Secondary | ICD-10-CM | POA: Diagnosis not present

## 2020-03-06 DIAGNOSIS — I129 Hypertensive chronic kidney disease with stage 1 through stage 4 chronic kidney disease, or unspecified chronic kidney disease: Secondary | ICD-10-CM | POA: Diagnosis not present

## 2020-03-06 DIAGNOSIS — Z833 Family history of diabetes mellitus: Secondary | ICD-10-CM

## 2020-03-06 DIAGNOSIS — I480 Paroxysmal atrial fibrillation: Secondary | ICD-10-CM | POA: Diagnosis present

## 2020-03-06 DIAGNOSIS — T85520A Displacement of bile duct prosthesis, initial encounter: Secondary | ICD-10-CM | POA: Diagnosis not present

## 2020-03-06 DIAGNOSIS — Z8249 Family history of ischemic heart disease and other diseases of the circulatory system: Secondary | ICD-10-CM

## 2020-03-06 DIAGNOSIS — N390 Urinary tract infection, site not specified: Secondary | ICD-10-CM | POA: Diagnosis not present

## 2020-03-06 DIAGNOSIS — K861 Other chronic pancreatitis: Secondary | ICD-10-CM

## 2020-03-06 DIAGNOSIS — M8580 Other specified disorders of bone density and structure, unspecified site: Secondary | ICD-10-CM | POA: Diagnosis present

## 2020-03-06 DIAGNOSIS — Z86718 Personal history of other venous thrombosis and embolism: Secondary | ICD-10-CM

## 2020-03-06 DIAGNOSIS — I4891 Unspecified atrial fibrillation: Secondary | ICD-10-CM | POA: Diagnosis present

## 2020-03-06 DIAGNOSIS — Z86711 Personal history of pulmonary embolism: Secondary | ICD-10-CM

## 2020-03-06 DIAGNOSIS — Z20822 Contact with and (suspected) exposure to covid-19: Secondary | ICD-10-CM | POA: Diagnosis not present

## 2020-03-06 DIAGNOSIS — E118 Type 2 diabetes mellitus with unspecified complications: Secondary | ICD-10-CM | POA: Diagnosis present

## 2020-03-06 DIAGNOSIS — Z889 Allergy status to unspecified drugs, medicaments and biological substances status: Secondary | ICD-10-CM

## 2020-03-06 DIAGNOSIS — Z03818 Encounter for observation for suspected exposure to other biological agents ruled out: Secondary | ICD-10-CM | POA: Diagnosis not present

## 2020-03-06 DIAGNOSIS — Z83438 Family history of other disorder of lipoprotein metabolism and other lipidemia: Secondary | ICD-10-CM

## 2020-03-06 DIAGNOSIS — E43 Unspecified severe protein-calorie malnutrition: Secondary | ICD-10-CM | POA: Diagnosis not present

## 2020-03-06 DIAGNOSIS — Z931 Gastrostomy status: Secondary | ICD-10-CM

## 2020-03-06 DIAGNOSIS — H35033 Hypertensive retinopathy, bilateral: Secondary | ICD-10-CM | POA: Diagnosis present

## 2020-03-06 DIAGNOSIS — K760 Fatty (change of) liver, not elsewhere classified: Secondary | ICD-10-CM | POA: Diagnosis present

## 2020-03-06 DIAGNOSIS — R319 Hematuria, unspecified: Secondary | ICD-10-CM

## 2020-03-06 DIAGNOSIS — E1122 Type 2 diabetes mellitus with diabetic chronic kidney disease: Secondary | ICD-10-CM | POA: Diagnosis present

## 2020-03-06 DIAGNOSIS — N183 Chronic kidney disease, stage 3 unspecified: Secondary | ICD-10-CM | POA: Diagnosis present

## 2020-03-06 DIAGNOSIS — Z823 Family history of stroke: Secondary | ICD-10-CM

## 2020-03-06 DIAGNOSIS — Z96651 Presence of right artificial knee joint: Secondary | ICD-10-CM | POA: Diagnosis present

## 2020-03-06 DIAGNOSIS — Z882 Allergy status to sulfonamides status: Secondary | ICD-10-CM

## 2020-03-06 DIAGNOSIS — E1165 Type 2 diabetes mellitus with hyperglycemia: Secondary | ICD-10-CM | POA: Diagnosis present

## 2020-03-06 DIAGNOSIS — Z881 Allergy status to other antibiotic agents status: Secondary | ICD-10-CM

## 2020-03-06 DIAGNOSIS — Z800 Family history of malignant neoplasm of digestive organs: Secondary | ICD-10-CM

## 2020-03-06 DIAGNOSIS — I10 Essential (primary) hypertension: Secondary | ICD-10-CM | POA: Diagnosis present

## 2020-03-06 LAB — BASIC METABOLIC PANEL
Anion gap: 11 (ref 5–15)
BUN: 48 mg/dL — ABNORMAL HIGH (ref 8–23)
CO2: 26 mmol/L (ref 22–32)
Calcium: 9.4 mg/dL (ref 8.9–10.3)
Chloride: 93 mmol/L — ABNORMAL LOW (ref 98–111)
Creatinine, Ser: 1.2 mg/dL — ABNORMAL HIGH (ref 0.44–1.00)
GFR calc Af Amer: 51 mL/min — ABNORMAL LOW (ref >60)
GFR calc non Af Amer: 44 mL/min — ABNORMAL LOW (ref >60)
Glucose, Bld: 175 mg/dL — ABNORMAL HIGH (ref 70–99)
Potassium: 5 mmol/L (ref 3.5–5.1)
Sodium: 130 mmol/L — ABNORMAL LOW (ref 135–145)

## 2020-03-06 LAB — HEPATIC FUNCTION PANEL
ALT: 24 U/L (ref 0–44)
AST: 17 U/L (ref 15–41)
Albumin: 3.9 g/dL (ref 3.5–5.0)
Alkaline Phosphatase: 235 U/L — ABNORMAL HIGH (ref 38–126)
Bilirubin, Direct: 0.1 mg/dL (ref 0.0–0.2)
Indirect Bilirubin: 0.5 mg/dL (ref 0.3–0.9)
Total Bilirubin: 0.6 mg/dL (ref 0.3–1.2)
Total Protein: 7.6 g/dL (ref 6.5–8.1)

## 2020-03-06 LAB — PROTIME-INR
INR: 1 (ref 0.8–1.2)
Prothrombin Time: 12.9 seconds (ref 11.4–15.2)

## 2020-03-06 LAB — CBC WITH DIFFERENTIAL/PLATELET
Abs Immature Granulocytes: 0.06 10*3/uL (ref 0.00–0.07)
Basophils Absolute: 0 10*3/uL (ref 0.0–0.1)
Basophils Relative: 1 %
Eosinophils Absolute: 0 10*3/uL (ref 0.0–0.5)
Eosinophils Relative: 0 %
HCT: 32.7 % — ABNORMAL LOW (ref 36.0–46.0)
Hemoglobin: 10.9 g/dL — ABNORMAL LOW (ref 12.0–15.0)
Immature Granulocytes: 1 %
Lymphocytes Relative: 12 %
Lymphs Abs: 1 10*3/uL (ref 0.7–4.0)
MCH: 28.2 pg (ref 26.0–34.0)
MCHC: 33.3 g/dL (ref 30.0–36.0)
MCV: 84.5 fL (ref 80.0–100.0)
Monocytes Absolute: 0.9 10*3/uL (ref 0.1–1.0)
Monocytes Relative: 11 %
Neutro Abs: 6.4 10*3/uL (ref 1.7–7.7)
Neutrophils Relative %: 75 %
Platelets: 262 10*3/uL (ref 150–400)
RBC: 3.87 MIL/uL (ref 3.87–5.11)
RDW: 15.8 % — ABNORMAL HIGH (ref 11.5–15.5)
WBC: 8.5 10*3/uL (ref 4.0–10.5)
nRBC: 0 % (ref 0.0–0.2)

## 2020-03-06 LAB — LACTIC ACID, PLASMA: Lactic Acid, Venous: 1.1 mmol/L (ref 0.5–1.9)

## 2020-03-06 LAB — LIPASE, BLOOD: Lipase: 32 U/L (ref 11–51)

## 2020-03-06 MED ORDER — SODIUM CHLORIDE 0.9 % IV BOLUS
500.0000 mL | Freq: Once | INTRAVENOUS | Status: AC
  Administered 2020-03-06: 500 mL via INTRAVENOUS

## 2020-03-06 MED ORDER — ONDANSETRON 4 MG PO TBDP
4.0000 mg | ORAL_TABLET | Freq: Once | ORAL | Status: AC
  Administered 2020-03-06: 4 mg via ORAL
  Filled 2020-03-06: qty 1

## 2020-03-06 MED ORDER — ACETAMINOPHEN 10 MG/ML IV SOLN
1000.0000 mg | Freq: Four times a day (QID) | INTRAVENOUS | Status: AC
  Administered 2020-03-06 – 2020-03-07 (×3): 1000 mg via INTRAVENOUS
  Filled 2020-03-06 (×4): qty 100

## 2020-03-06 NOTE — ED Notes (Signed)
[facility] care facility contacted by this RN. This RN spoke with Reginold Agent, med tech for returning pt. This RN inquired on last wound or skin care assessment charted for pt. Per Reginold Agent, no documentation found other than pt to have skin assessment on 03/07/2020. This RN also found in summary report brought with pt that pt is to be on 2L of oxygen. Pt came to ED with no oxygen as well as report that she had not had any oxygen while at the facility. This RN provided to [facility] that wounds have been cleaned, dressed and that pt would be returning to facility on the ordered oxygen at 2L. Education and instructions provided to med tech on infection prevention and the need for regular wound care as well as drains flushed and emptied. If bags are not secured and fill up, the drains will dislodge.

## 2020-03-06 NOTE — ED Notes (Signed)
Pt still has not produced urine output. Pt voices that she feels full but is unable to eliminate. MD consulted. Bladder scan revealed 286ml.  In & Out performed with output of 247ml that is dark yellow. At end of cath, pt produced thick, white/green discharge from the in/out catheter measuring 69ml. MD at bedside to assess. Specimens sent to lab. D/c on hold for further evaluation.

## 2020-03-06 NOTE — Discharge Instructions (Addendum)
You should contact Dr. Alwyn Pea office to schedule an appointment for next week.  In the meantime, return to the ER immediately for new or worsening pain, vomiting, jaundice, fever, bleeding or drainage from the wound, worsening drainage or bleeding from the other drain, or any other new or worsening symptoms that concern you.

## 2020-03-06 NOTE — ED Notes (Signed)
Per Dr Conni Elliot called at 426p and spoke with Abby for consult for surgical oncology and currently awaiting call back from [facility]

## 2020-03-06 NOTE — ED Provider Notes (Signed)
-----------------------------------------   11:40 PM on 03/06/2020 -----------------------------------------  The patient has not been able to urinate during her time in the ED.  Per the RN, the urine appeared purulent when she attempted to cath the patient.  We will send a urinalysis and a lactate.  Given that the patient is not really tolerating p.o., if the urinalysis is consistent with UTI, the patient may need admission for IV antibiotics.  I have signed her out to the oncoming physician Dr. Owens Shark.   Arta Silence, MD 03/06/20 [phone]

## 2020-03-06 NOTE — ED Notes (Addendum)
RN provided update and discharge to spouse and son, per pts request.   New drainage bag in place.

## 2020-03-06 NOTE — ED Notes (Signed)
MD at bedside to update patient and family on plan of care.

## 2020-03-06 NOTE — ED Provider Notes (Signed)
[facility] Emergency Department Provider Note ____________________________________________   First MD Initiated Contact with Patient 03/06/20 1556     (approximate)  I have reviewed the triage vital signs and the nursing notes.   HISTORY  Chief Complaint Post-op Problem    HPI Katherine Oliver is a 74 y.o. 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  The patient states that she was doing physical therapy at the facility, and the drain fell out and was found on the ground.  She denies any acute pain.  The patient reports that recently the facility staff has not been emptying the bag frequently enough, but otherwise has had no issues with the drain.  Past Medical History:  Diagnosis Date  . Allergic rhinitis   . Arthritis   . 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 nephropathy ([place])    DSME at [facility] 01/2016   . Frequent epistaxis 05/16/2019   S/p cauterization with resolution 2020  . GERD (gastroesophageal reflux disease)   . Hepatic steatosis    by abd [place] 05/2012, mild transaminitis - normal iron sat and viral hep panel (2011), stable [place] 2017  . History of chicken pox   . History of measles   . History of recurrent UTIs    on chronic keflex  . HLD (hyperlipidemia)   . HTN (hypertension)   . 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 Kidney cyst, acquired 01/2016   L kidney by [place]  . Kidney stone 01/2016   L kidney by [place]  . Lung nodules 11/2013   overall stable on f/u CT 01/2016  . Osteopenia 06/2013   mild, forearm T -1.1, hip and spine WNL  . Pancreatitis   . Polycythemia    mild, stable (2013)  . Primary localized osteoarthritis of right knee 01/05/2019  .  Rosacea    metrogel    Patient Active Problem List   Diagnosis Date Noted  . Pancreatitis 01/12/2020  . Acute pancreatitis 01/07/2020  . Ovarian mass, right 01/06/2020  . Acquired renal cyst of left kidney 01/06/2020  . Gastric stress ulcer 01/06/2020  . Acute necrotizing pancreatitis   . AF (paroxysmal atrial fibrillation) ([place])   . Anemia   . AKI (acute kidney injury) ([place]) 12/15/2019  . Choledocholithiasis   . Acute gallstone pancreatitis 12/13/2019  . Primary localized osteoarthritis of right knee 01/05/2019  . 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 Hx of adenomatous polyp of colon 12/15/2017  . Vulvar dermatitis 12/07/2017  . Stress due to illness of family member 08/18/2017  . Left sided abdominal pain 02/05/2017  . Right hip pain 08/07/2016  . CKD stage 3 due to type 2 diabetes mellitus ([place]) 04/04/2016  . Trigger ring finger of right hand 04/04/2016  . Pulmonary nodules 01/03/2016  . Advanced care planning/counseling discussion 07/05/2015  . Obesity, Class I, BMI 30-34.9 07/05/2015  . 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 Osteopenia 06/03/2013  . Medicare annual wellness visit, subsequent 05/31/2012  . Rotator cuff tendonitis, right 03/01/2012  . Polycythemia 12/02/2011  . Diabetes mellitus type 2, uncontrolled, with complications ([place])   . HTN (hypertension)   . Dyslipidemia associated with type 2  diabetes mellitus ([place])   . History of recurrent UTIs   . Allergic rhinitis   . GERD (gastroesophageal reflux disease)   . Rosacea   . NAFLD (nonalcoholic fatty liver disease) 06/03/2010    Past Surgical History:  Procedure Laterality Date  . APPENDECTOMY  1987  . BILIARY STENT PLACEMENT  12/14/2019   Procedure: BILIARY STENT PLACEMENT;  Surgeon: [name], MD;  Location: [place];  Service: Endoscopy;;  . BREAST BIOPSY Right 1963   benign  . BREAST BIOPSY Right 08/2014   benign- core  . cardiolite stress test  04/2004     normal  . CESAREAN SECTION  5093;2671   x2  . CHOLECYSTECTOMY  2003  . COLONOSCOPY  09/26/2008   adenomatous polyp, rpt 3-5 yrs  . COLONOSCOPY  08/2012   adenomatous polyps, diverticulosis, rec rpt 5 yrs [name])  . COLONOSCOPY WITH PROPOFOL N/A 02/05/2018   4TA, SSA, diverticulosis, rpt 3 yrs [name], [name], MD)  . dexa  2003   normal  . dexa  06/2013   [facility] - Tscore -1.1 forearm, normal spine and femur  . ERCP N/A 12/14/2019   Procedure: ENDOSCOPIC RETROGRADE CHOLANGIOPANCREATOGRAPHY (ERCP);  Surgeon: [name], MD;  Location: [place];  Service: Endoscopy;  Laterality: N/A;  . ERCP  01/2020   nonbleeding gastric ulcer - [facility] hospitalization (Dr Mont Dutton)  . ESOPHAGOGASTRODUODENOSCOPY N/A 12/19/2019   Procedure: ESOPHAGOGASTRODUODENOSCOPY (EGD);  Surgeon: [name], MD;  Location: [facility]COPY;  Service: Endoscopy;  Laterality: N/A;  . ESOPHAGOGASTRODUODENOSCOPY  01/2020   pre existing AXIOS cystogastrostomy stent s/p necrosectomy - [facility] hospitalization (Dr Mont Dutton)  . IR FLUORO GUIDE CV LINE RIGHT  12/27/2019  . IR REMOVAL TUN CV CATH W/O FL  01/03/2020  . SPHINCTEROTOMY  12/14/2019   Procedure: SPHINCTEROTOMY;  Surgeon: [name], MD;  Location: [place];  Service: Endoscopy;;  . 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 TRANSTHORACIC ECHOCARDIOGRAM  04/2019   EF 55-60%, modLVH, impaired relaxation   . TRIGGER FINGER RELEASE  2007;2010;2011   bilateral  . TRIGGER FINGER RELEASE  02/2017  . VAGINAL HYSTERECTOMY  1984   for menorrhagia, ovaries in place    Prior to Admission medications   Medication Sig Start Date End Date Taking? Authorizing Provider  acetaminophen (TYLENOL) 160 MG/5ML suspension Place 30.5 mg into feeding tube every 6 (six) hours as needed.   Yes [provider]  amLODIPine Benzoate 1 MG/ML SUSP  2.5 mLs by Per J Tube route daily in the afternoon.   Yes [provider]  benazepril (LOTENSIN) 20 MG tablet 20 mg by Per J Tube route daily.   Yes [provider]  DILTIAZEM HCL PO 7.5 mLs by Per J Tube route.   Yes [provider]  Enoxaparin Sodium (LOVENOX IJ) Inject 70 mg into the muscle every 12 (twelve) hours. 02/24/20 04/24/20 Yes [provider]  fluticasone (FLONASE) 50 MCG/ACT nasal spray Place 1 spray into both nostrils daily.   Yes [provider]  hydrochlorothiazide (HYDRODIURIL) 12.5 MG tablet 12.5 mg by Per J Tube route daily.   Yes [provider]  insulin glargine (LANTUS) 100 UNIT/ML injection Inject 0.1 mLs (10 Units total) into the skin daily. Patient taking differently: Inject 14 Units into the skin at bedtime.  01/13/20  Yes Donne Hazel, MD  insulin regular (NOVOLIN R) 100 units/mL injection Inject 0-12  Units into the skin every 6 (six) hours as needed for high blood sugar.   Yes [provider]  Loperamide HCl 1 MG/7.5ML SOLN 7.5 mLs by Per J Tube route every 6 (six) hours as needed (DIARRHEA). 02/24/20 03/25/20 Yes [provider]  melatonin 3 MG TABS tablet Place 3 mg into feeding tube at bedtime. 02/24/20 03/25/20 Yes [provider]  Multiple Vitamin (MULTIVITAMIN) tablet 1 tablet by Per J Tube route daily.   Yes [provider]  omeprazole (FIRST-OMEPRAZOLE) 2 mg/mL SUSP oral suspension Place 10 mg into feeding tube daily.   Yes [provider]  ondansetron (ZOFRAN) 4 MG/5ML solution Take 10 mg by mouth every 6 (six) hours as needed for nausea or vomiting.   Yes [provider]  oxyCODONE (ROXICODONE) 5 MG/5ML solution Place 2.5 mg into feeding tube every 6 (six) hours as needed for moderate pain or severe pain.   Yes [provider]  Pancrelipase, Lip-Prot-Amyl, [phone] units TABS 2 tablets by Per J Tube route 3 (three) times daily with meals.   Yes  [provider]  diphenhydrAMINE (BENADRYL) 50 MG/ML injection Inject 0.25 mLs (12.5 mg total) into the vein every 6 (six) hours as needed for itching. Patient not taking: Reported on 03/06/2020 01/12/20   Donne Hazel, MD  HYDROmorphone (DILAUDID) 1 MG/ML injection Inject 0.25 mLs (0.25 mg total) into the vein every 2 (two) hours as needed for severe pain. Patient not taking: Reported on 03/06/2020 01/12/20   Donne Hazel, MD  HYDROmorphone (DILAUDID) 1 MG/ML injection Inject 0.5 mLs (0.5 mg total) into the vein every 6 (six) hours. Patient not taking: Reported on 03/06/2020 01/12/20   Donne Hazel, MD  insulin aspart (NOVOLOG) 100 UNIT/ML injection Inject 0-15 Units into the skin every 4 (four) hours. Patient not taking: Reported on 03/06/2020 01/12/20   Donne Hazel, MD  labetalol (NORMODYNE) 5 MG/ML injection Inject 1 mL (5 mg total) into the vein every 2 (two) hours as needed (HR>120, hold sbp<100). Patient not taking: Reported on 03/06/2020 01/12/20   Donne Hazel, MD  meropenem 1 g in sodium chloride 0.9 % 100 mL Inject 1 g into the vein every 12 (twelve) hours. Patient not taking: Reported on 03/06/2020 01/12/20   Donne Hazel, MD  metoCLOPramide (REGLAN) 5 MG/ML injection Inject 2 mLs (10 mg total) into the vein every 6 (six) hours as needed. Patient not taking: Reported on 03/06/2020 01/12/20   Donne Hazel, MD  metoprolol tartrate (LOPRESSOR) 5 MG/5ML SOLN injection Inject 5 mLs (5 mg total) into the vein every 6 (six) hours. Patient not taking: Reported on 03/06/2020 01/12/20   Donne Hazel, MD  naloxone [facility]) 0.4 MG/ML injection Inject 1 mL (0.4 mg total) into the vein as needed (respiratory rate < 10). Patient not taking: Reported on 03/06/2020 01/12/20   Donne Hazel, MD  Nutritional Supplements (FEEDING SUPPLEMENT, VITAL AF 1.2 CAL,) LIQD Place 1,000 mLs into feeding tube continuous. 01/12/20   Donne Hazel, MD  nystatin cream (MYCOSTATIN) Apply topically 2 (two)  times daily. Patient not taking: Reported on 03/06/2020 01/12/20   Donne Hazel, MD  ondansetron (ZOFRAN) 4 MG tablet Take 1 tablet (4 mg total) by mouth every 6 (six) hours as needed for nausea. Patient not taking: Reported on 03/06/2020 01/12/20   Donne Hazel, MD  ondansetron [facility]) 4 MG/2ML SOLN injection Inject 2 mLs (4 mg total) into the vein every 6 (six) hours  as needed for nausea. Patient not taking: Reported on 03/06/2020 01/12/20   Donne Hazel, MD  pantoprazole (PROTONIX) 40 MG injection Inject 40 mg into the vein every 12 (twelve) hours. Patient not taking: Reported on 03/06/2020 01/15/20   Donne Hazel, MD  promethazine (PHENERGAN) 25 MG/ML injection Inject 0.5 mLs (12.5 mg total) into the vein every 6 (six) hours as needed for nausea or vomiting. Patient not taking: Reported on 03/06/2020 01/12/20   Donne Hazel, MD    Allergies Azithromycin, Nickel, Sulfa antibiotics, Vinegar [acetic acid], and Adhesive [tape]  Family History  Problem Relation Age of Onset  . Stroke Mother        several  . Hyperlipidemia Mother   . Hypertension Mother   . Cancer Father        colon  . Hypertension Father   . Hyperlipidemia Father   . Coronary artery disease Father 70       MIx1, CABG  . Cancer Paternal Aunt        abdominal  . Coronary artery disease Maternal Grandmother   . Diabetes Maternal Grandfather   . Coronary artery disease Maternal Grandfather   . Breast cancer Neg Hx     Social History Social History   Tobacco Use  . Smoking status: Never Smoker  . Smokeless tobacco: Never Used  Substance Use Topics  . Alcohol use: Not Currently    Alcohol/week: 1.0 - 2.0 standard drinks    Types: 1 - 2 Glasses of wine per week    Comment: Occasional/1 mixed drink per month  . Drug use: No    Review of Systems  Constitutional: No fever/chills. Eyes: No visual changes. ENT: No sore throat. Cardiovascular: Denies chest pain. Respiratory: Denies shortness of  breath. Gastrointestinal: No vomiting or diarrhea.  Genitourinary: Negative for dysuria.  Musculoskeletal: Negative for back pain. Skin: Negative for rash. Neurological: Negative for headache.   ____________________________________________   PHYSICAL EXAM:  VITAL SIGNS: ED Triage Vitals  Enc Vitals Group     BP 03/06/20 1552 133/82     Pulse Rate 03/06/20 1552 (!) 111     Resp 03/06/20 1552 17     Temp 03/06/20 1552 98 F (36.7 C)     Temp Source 03/06/20 1552 Oral     SpO2 03/06/20 1552 93 %     Weight 03/06/20 1553 203 lb 0.7 oz (92.1 kg)     Height 03/06/20 1553 '5\' 1"'$  (1.549 m)     Head Circumference --      Peak Flow --      Pain Score 03/06/20 1553 7     Pain Loc --      Pain Edu? --      Excl. in [place]? --     Constitutional: Alert and oriented. Well appearing and in no acute distress. Eyes: Conjunctivae are normal.  Head: Atraumatic. Nose: No congestion/rhinnorhea. Mouth/Throat: Mucous membranes are moist.   Neck: Normal range of motion.  Cardiovascular: Good peripheral circulation. Respiratory: Normal respiratory effort.  No retractions.  Gastrointestinal: Soft and nontender. No distention.  Right upper quadrant drain site clean and dry, with no drainage.  No surrounding tenderness.  No erythema or induration. Genitourinary: No CVA tenderness. Musculoskeletal: Extremities warm and well perfused.  Neurologic:  Normal speech and language. No gross focal neurologic deficits are appreciated.  Skin:  Skin is warm and dry. No rash noted. Psychiatric: Mood and affect are normal. Speech and behavior are normal.  ____________________________________________  LABS (all labs ordered are listed, but only abnormal results are displayed)  Labs Reviewed  BASIC METABOLIC PANEL - Abnormal; Notable for the following components:      Result Value   Sodium 130 (*)    Chloride 93 (*)    Glucose, Bld 175 (*)    BUN 48 (*)    Creatinine, Ser 1.20 (*)    GFR calc non Af  Amer 44 (*)    GFR calc Af Amer 51 (*)    All other components within normal limits  HEPATIC FUNCTION PANEL - Abnormal; Notable for the following components:   Alkaline Phosphatase 235 (*)    All other components within normal limits  CBC WITH DIFFERENTIAL/PLATELET - Abnormal; Notable for the following components:   Hemoglobin 10.9 (*)    HCT 32.7 (*)    RDW 15.8 (*)    All other components within normal limits  LIPASE, BLOOD  PROTIME-INR   ____________________________________________  EKG   ____________________________________________  RADIOLOGY    ____________________________________________   PROCEDURES  Procedure(s) performed: No  Procedures  Critical Care performed: No ____________________________________________   INITIAL IMPRESSION / ASSESSMENT AND PLAN / ED COURSE  Pertinent labs & imaging results that were available during my care of the patient were reviewed by me and considered in my medical decision making (see chart for details).  74 year old female with PMH as noted above and status post recent admission at [facility] for necrotizing pancreatitis with VARD and jejunostomy last month with a biliary drain presents for dislodgment of the drain today.  She states that she already had an issue and that the facility staff were not emptying or flushing the drain as often as she felt they were supposed to.  Then today during physical therapy, it fell out.  She denies any acute pain or other symptoms at this time.  On exam, the patient is overall well-appearing.  Her vital signs are normal except for slight tachycardia of unclear significance.  She has no tenderness or drainage from the drain site in her right upper quadrant, and the wound appears clean and dry.  We will obtain labs.  I will consult the patient's surgical oncologist at [facility] for further recommendations.  ----------------------------------------- 9:34 PM on  03/06/2020 -----------------------------------------  The lab work-up is reassuring.  The patient's creatinine is slightly elevated from baseline.  Alk phos is also slightly elevated, but her bilirubins and LFTs are normal.  She has no elevated WBC count, and the lipase is normal.  I discussed the patient's case with Dr. Mariah Milling from surgical oncology at [facility], who took care of the patient during her recent admission there (and who she was supposed to see today).  He advises that the drain which came out is actually contiguous with the other drain on the patient's left, as well as with the stomach.  Therefore he is not acutely concerned about any fluid collection, obstruction, or other immediate complication, as the biliary fluid should be able to drain elsewhere.  He advises that if the patient had active pain or concerning lab abnormalities, we could obtain imaging, however this likely would not change management.    At this time, the patient's overall clinical picture is reassuring.  She has no abdominal or back pain, and reports mild nausea which has been the same since she was discharged, with no new vomiting or other acute symptoms.  She is afebrile with no signs of sepsis.  Based on this information, Dr. Mariah Milling advised that the patient  would be appropriate for close outpatient follow-up with him early next week.  The patient is slightly tachycardic, although it appears that her baseline heart rate is around 100, and she was in the high 90s most recently at [facility].  I suspect that she is slightly dehydrated from decreased p.o. intake especially given the mildly elevated creatinine.  We gave a fluid bolus and the heart rate came down to 106.  The patient states she has been taking Tylenol for her routine postoperative pain although cannot tolerate p.o., so I gave her an IV dose.  At this time, the patient feels comfortable and appears well.  She is stable for discharge back to her facility.  I counseled  her on the results of the work-up and the recommendations of Dr. Mariah Milling.  She agrees to follow-up next week as planned.  I gave her very thorough return precautions, and she expressed understanding.  ____________________________________________   FINAL CLINICAL IMPRESSION(S) / ED DIAGNOSES  Final diagnoses:  Biliary drain displacement, initial encounter      NEW MEDICATIONS STARTED DURING THIS VISIT:  New Prescriptions   No medications on file     Note:  This document was prepared using Dragon voice recognition software and may include unintentional dictation errors.    Arta Silence, MD 03/06/20 2140

## 2020-03-06 NOTE — ED Triage Notes (Signed)
Pt presents to ED via [facility] from [facility] with c/o biliary tube displacement. Pt denies pain, only c/o nausea. Pt states when she was working with physical therapy, tubing came out. Drains placed due to chronic hx of pancreatitis.

## 2020-03-06 NOTE — ED Notes (Signed)
External catheter in place. Brief changed. Pts left side drain bandage dirty and report from pt and family, bandage had not been changed since April at [facility]. Area cleaned and new dressing applied. This RN to call nursing facility to provide education on the need for regular dressing changes and infection prevention.

## 2020-03-06 NOTE — ED Notes (Signed)
[facility] contacted in concern for pts continued care at current facility, [facility].

## 2020-03-07 ENCOUNTER — Telehealth: Payer: Self-pay | Admitting: Family Medicine

## 2020-03-07 ENCOUNTER — Encounter: Payer: Self-pay | Admitting: Internal Medicine

## 2020-03-07 DIAGNOSIS — E1165 Type 2 diabetes mellitus with hyperglycemia: Secondary | ICD-10-CM | POA: Diagnosis not present

## 2020-03-07 DIAGNOSIS — Z931 Gastrostomy status: Secondary | ICD-10-CM

## 2020-03-07 DIAGNOSIS — Z7401 Bed confinement status: Secondary | ICD-10-CM | POA: Diagnosis not present

## 2020-03-07 DIAGNOSIS — R110 Nausea: Secondary | ICD-10-CM | POA: Diagnosis not present

## 2020-03-07 DIAGNOSIS — Z794 Long term (current) use of insulin: Secondary | ICD-10-CM | POA: Diagnosis not present

## 2020-03-07 DIAGNOSIS — Z833 Family history of diabetes mellitus: Secondary | ICD-10-CM | POA: Diagnosis not present

## 2020-03-07 DIAGNOSIS — K760 Fatty (change of) liver, not elsewhere classified: Secondary | ICD-10-CM | POA: Diagnosis present

## 2020-03-07 DIAGNOSIS — E118 Type 2 diabetes mellitus with unspecified complications: Secondary | ICD-10-CM | POA: Diagnosis not present

## 2020-03-07 DIAGNOSIS — E119 Type 2 diabetes mellitus without complications: Secondary | ICD-10-CM | POA: Diagnosis not present

## 2020-03-07 DIAGNOSIS — I10 Essential (primary) hypertension: Secondary | ICD-10-CM | POA: Diagnosis not present

## 2020-03-07 DIAGNOSIS — Z800 Family history of malignant neoplasm of digestive organs: Secondary | ICD-10-CM | POA: Diagnosis not present

## 2020-03-07 DIAGNOSIS — Z8249 Family history of ischemic heart disease and other diseases of the circulatory system: Secondary | ICD-10-CM | POA: Diagnosis not present

## 2020-03-07 DIAGNOSIS — I129 Hypertensive chronic kidney disease with stage 1 through stage 4 chronic kidney disease, or unspecified chronic kidney disease: Secondary | ICD-10-CM | POA: Diagnosis present

## 2020-03-07 DIAGNOSIS — I480 Paroxysmal atrial fibrillation: Secondary | ICD-10-CM | POA: Diagnosis present

## 2020-03-07 DIAGNOSIS — Z96651 Presence of right artificial knee joint: Secondary | ICD-10-CM | POA: Diagnosis present

## 2020-03-07 DIAGNOSIS — K8591 Acute pancreatitis with uninfected necrosis, unspecified: Secondary | ICD-10-CM | POA: Diagnosis not present

## 2020-03-07 DIAGNOSIS — N390 Urinary tract infection, site not specified: Secondary | ICD-10-CM | POA: Diagnosis present

## 2020-03-07 DIAGNOSIS — H35033 Hypertensive retinopathy, bilateral: Secondary | ICD-10-CM | POA: Diagnosis present

## 2020-03-07 DIAGNOSIS — Z83438 Family history of other disorder of lipoprotein metabolism and other lipidemia: Secondary | ICD-10-CM | POA: Diagnosis not present

## 2020-03-07 DIAGNOSIS — Z881 Allergy status to other antibiotic agents status: Secondary | ICD-10-CM | POA: Diagnosis not present

## 2020-03-07 DIAGNOSIS — M1711 Unilateral primary osteoarthritis, right knee: Secondary | ICD-10-CM | POA: Diagnosis not present

## 2020-03-07 DIAGNOSIS — Z882 Allergy status to sulfonamides status: Secondary | ICD-10-CM | POA: Diagnosis not present

## 2020-03-07 DIAGNOSIS — Z20822 Contact with and (suspected) exposure to covid-19: Secondary | ICD-10-CM | POA: Diagnosis present

## 2020-03-07 DIAGNOSIS — Z823 Family history of stroke: Secondary | ICD-10-CM | POA: Diagnosis not present

## 2020-03-07 DIAGNOSIS — E1122 Type 2 diabetes mellitus with diabetic chronic kidney disease: Secondary | ICD-10-CM | POA: Diagnosis present

## 2020-03-07 DIAGNOSIS — E785 Hyperlipidemia, unspecified: Secondary | ICD-10-CM | POA: Diagnosis present

## 2020-03-07 DIAGNOSIS — Z888 Allergy status to other drugs, medicaments and biological substances status: Secondary | ICD-10-CM | POA: Diagnosis not present

## 2020-03-07 DIAGNOSIS — Z938 Other artificial opening status: Secondary | ICD-10-CM | POA: Diagnosis not present

## 2020-03-07 DIAGNOSIS — I4581 Long QT syndrome: Secondary | ICD-10-CM | POA: Diagnosis not present

## 2020-03-07 DIAGNOSIS — K861 Other chronic pancreatitis: Secondary | ICD-10-CM | POA: Diagnosis present

## 2020-03-07 DIAGNOSIS — R0902 Hypoxemia: Secondary | ICD-10-CM | POA: Diagnosis not present

## 2020-03-07 DIAGNOSIS — K219 Gastro-esophageal reflux disease without esophagitis: Secondary | ICD-10-CM | POA: Diagnosis present

## 2020-03-07 DIAGNOSIS — T85520A Displacement of bile duct prosthesis, initial encounter: Secondary | ICD-10-CM

## 2020-03-07 DIAGNOSIS — M2550 Pain in unspecified joint: Secondary | ICD-10-CM | POA: Diagnosis not present

## 2020-03-07 DIAGNOSIS — M6281 Muscle weakness (generalized): Secondary | ICD-10-CM | POA: Diagnosis not present

## 2020-03-07 DIAGNOSIS — R319 Hematuria, unspecified: Secondary | ICD-10-CM

## 2020-03-07 DIAGNOSIS — M8580 Other specified disorders of bone density and structure, unspecified site: Secondary | ICD-10-CM | POA: Diagnosis present

## 2020-03-07 DIAGNOSIS — N183 Chronic kidney disease, stage 3 unspecified: Secondary | ICD-10-CM | POA: Diagnosis present

## 2020-03-07 LAB — URINALYSIS, COMPLETE (UACMP) WITH MICROSCOPIC
Bilirubin Urine: NEGATIVE
Glucose, UA: NEGATIVE mg/dL
Hgb urine dipstick: NEGATIVE
Ketones, ur: NEGATIVE mg/dL
Nitrite: NEGATIVE
Protein, ur: 30 mg/dL — AB
RBC / HPF: NONE SEEN RBC/hpf (ref 0–5)
Specific Gravity, Urine: 1.021 (ref 1.005–1.030)
WBC, UA: >50 WBC/hpf (ref 0–5)
pH: 5 (ref 5.0–8.0)

## 2020-03-07 LAB — HEMOGLOBIN A1C
Hgb A1c MFr Bld: 6.2 % — ABNORMAL HIGH (ref 4.8–5.6)
Mean Plasma Glucose: 131.24 mg/dL

## 2020-03-07 LAB — GLUCOSE, CAPILLARY
Glucose-Capillary: 152 mg/dL — ABNORMAL HIGH (ref 70–99)
Glucose-Capillary: 197 mg/dL — ABNORMAL HIGH (ref 70–99)
Glucose-Capillary: 181 mg/dL — ABNORMAL HIGH (ref 70–99)
Glucose-Capillary: 152 mg/dL — ABNORMAL HIGH (ref 70–99)
Glucose-Capillary: 161 mg/dL — ABNORMAL HIGH (ref 70–99)

## 2020-03-07 LAB — URINE CULTURE

## 2020-03-07 LAB — RESPIRATORY PANEL BY RT PCR (FLU A&B, COVID)
Influenza A by PCR: NEGATIVE
Influenza B by PCR: NEGATIVE
SARS Coronavirus 2 by RT PCR: NEGATIVE

## 2020-03-07 MED ORDER — ONDANSETRON HCL 4 MG PO TABS
4.0000 mg | ORAL_TABLET | Freq: Four times a day (QID) | ORAL | Status: DC | PRN
  Administered 2020-03-10: 10:00:00 4 mg via ORAL
  Filled 2020-03-07: qty 1

## 2020-03-07 MED ORDER — ONDANSETRON HCL 4 MG/2ML IJ SOLN
4.0000 mg | Freq: Four times a day (QID) | INTRAMUSCULAR | Status: DC | PRN
  Administered 2020-03-07 – 2020-03-11 (×9): 4 mg via INTRAVENOUS
  Filled 2020-03-07 (×9): qty 2

## 2020-03-07 MED ORDER — SODIUM CHLORIDE 0.9 % IV SOLN: INTRAVENOUS | Status: DC

## 2020-03-07 MED ORDER — ONDANSETRON HCL 4 MG/2ML IJ SOLN
4.0000 mg | Freq: Once | INTRAMUSCULAR | Status: AC
  Administered 2020-03-07: 4 mg via INTRAVENOUS

## 2020-03-07 MED ORDER — SODIUM CHLORIDE 0.9 % IV SOLN
1.0000 g | Freq: Once | INTRAVENOUS | Status: AC
  Administered 2020-03-07: 1 g via INTRAVENOUS
  Filled 2020-03-07: qty 10

## 2020-03-07 MED ORDER — INSULIN ASPART 100 UNIT/ML ~~LOC~~ SOLN
0.0000 [IU] | SUBCUTANEOUS | Status: DC
  Administered 2020-03-07 – 2020-03-08 (×5): 3 [IU] via SUBCUTANEOUS
  Administered 2020-03-08: 12:00:00 8 [IU] via SUBCUTANEOUS
  Administered 2020-03-08 – 2020-03-09 (×8): 3 [IU] via SUBCUTANEOUS
  Administered 2020-03-09: 5 [IU] via SUBCUTANEOUS
  Administered 2020-03-09 – 2020-03-10 (×4): 3 [IU] via SUBCUTANEOUS
  Administered 2020-03-10: 13:00:00 2 [IU] via SUBCUTANEOUS
  Administered 2020-03-10: 3 [IU] via SUBCUTANEOUS
  Administered 2020-03-11: 20:00:00 5 [IU] via SUBCUTANEOUS
  Administered 2020-03-11 (×3): 3 [IU] via SUBCUTANEOUS
  Administered 2020-03-11 – 2020-03-12 (×4): 5 [IU] via SUBCUTANEOUS
  Administered 2020-03-12 (×3): 3 [IU] via SUBCUTANEOUS
  Administered 2020-03-12 – 2020-03-13 (×2): 5 [IU] via SUBCUTANEOUS
  Administered 2020-03-13: 04:00:00 3 [IU] via SUBCUTANEOUS
  Administered 2020-03-13: 8 [IU] via SUBCUTANEOUS
  Administered 2020-03-13: 5 [IU] via SUBCUTANEOUS
  Filled 2020-03-07 (×37): qty 1

## 2020-03-07 MED ORDER — ACETAMINOPHEN 325 MG PO TABS
650.0000 mg | ORAL_TABLET | Freq: Four times a day (QID) | ORAL | Status: DC | PRN
  Administered 2020-03-08 – 2020-03-11 (×7): 650 mg via ORAL
  Filled 2020-03-07 (×8): qty 2

## 2020-03-07 MED ORDER — PROMETHAZINE HCL 25 MG/ML IJ SOLN
12.5000 mg | Freq: Four times a day (QID) | INTRAMUSCULAR | Status: DC | PRN
  Administered 2020-03-07 – 2020-03-11 (×9): 12.5 mg via INTRAVENOUS
  Filled 2020-03-07 (×9): qty 1

## 2020-03-07 MED ORDER — SODIUM CHLORIDE 0.9 % IV SOLN
1.0000 g | INTRAVENOUS | Status: DC
  Administered 2020-03-08 – 2020-03-09 (×2): 1 g via INTRAVENOUS
  Filled 2020-03-07 (×2): qty 1

## 2020-03-07 MED ORDER — ACETAMINOPHEN 650 MG RE SUPP: 650.0000 mg | Freq: Four times a day (QID) | RECTAL | Status: DC | PRN

## 2020-03-07 MED ORDER — OSMOLITE 1.2 CAL PO LIQD: 1000.0000 mL | ORAL | Status: DC

## 2020-03-07 MED ORDER — ONDANSETRON HCL 4 MG/2ML IJ SOLN
INTRAMUSCULAR | Status: AC
  Filled 2020-03-07: qty 2

## 2020-03-07 MED ORDER — SODIUM CHLORIDE 0.9 % IV BOLUS
250.0000 mL | Freq: Once | INTRAVENOUS | Status: AC
  Administered 2020-03-07: 250 mL via INTRAVENOUS

## 2020-03-07 MED ORDER — JEVITY 1.5 CAL/FIBER PO LIQD
1000.0000 mL | ORAL | Status: DC
  Administered 2020-03-07 – 2020-03-13 (×4): 1000 mL via ORAL

## 2020-03-07 MED ORDER — PANCRELIPASE (LIP-PROT-AMYL) 10440-39150 UNITS PO TABS
41760.0000 [IU] | ORAL_TABLET | Freq: Three times a day (TID) | ORAL | Status: DC
  Administered 2020-03-07 – 2020-03-13 (×13): 41760 [IU]
  Filled 2020-03-07 (×19): qty 4

## 2020-03-07 MED ORDER — FREE WATER
30.0000 mL | Status: DC
  Administered 2020-03-07 – 2020-03-13 (×36): 30 mL

## 2020-03-07 MED ORDER — ENOXAPARIN SODIUM 40 MG/0.4ML ~~LOC~~ SOLN: 40.0000 mg | SUBCUTANEOUS | Status: DC

## 2020-03-07 MED ORDER — ENOXAPARIN SODIUM 80 MG/0.8ML ~~LOC~~ SOLN
1.0000 mg/kg | Freq: Two times a day (BID) | SUBCUTANEOUS | Status: DC
  Administered 2020-03-07 – 2020-03-13 (×12): 65 mg via SUBCUTANEOUS
  Filled 2020-03-07 (×13): qty 0.8

## 2020-03-07 NOTE — TOC Progression Note (Addendum)
Transition of Care [facility]) - Progression Note    Patient Details  Name: Katherine Oliver MRN: [id number] Date of Birth: 01/19/1946  Transition of Care [facility]) CM/SW Contact  Starasia Sinko, Gardiner Rhyme, LCSW Phone Number: 03/07/2020, 3:06 PM  Clinical Narrative:  Have sent out [facility] for bed search no offers yet. Husband is aware of this. Need to see if being from SNF she has already met her three night stay in hospital. Work on discharge plan.         Expected Discharge Plan and Services                                                 Social Determinants of Health (SDOH) Interventions    Readmission Risk Interventions No flowsheet data found.

## 2020-03-07 NOTE — Telephone Encounter (Signed)
Patient's husband, [name], called.  Patient's at [facility] and she'll have to go to rehab when she's released. [name] wants to know if Dr.G has any suggestions for a rehab center.

## 2020-03-07 NOTE — TOC Progression Note (Signed)
Transition of Care [facility]) - Progression Note    Patient Details  Name: Katherine Oliver MRN: [id number] Date of Birth: 08/19/1946  Transition of Care [facility]) CM/SW Contact  Jodeci Rini, Gardiner Rhyme, LCSW Phone Number: 03/07/2020, 9:26 AM  Clinical Narrative:  Spoke with husband via telephone to discuss discharge needs. Husband feels [facility] has missed some things and would like for her to go to a different rehab. Can do FL2 and see if new facility will take her without three day length of stay due to Medicare. Will do FL2 and bed search          Expected Discharge Plan and Services                                                 Social Determinants of Health (SDOH) Interventions    Readmission Risk Interventions No flowsheet data found.

## 2020-03-07 NOTE — Evaluation (Signed)
Clinical/Bedside Swallow Evaluation Patient Details  Name: Katherine Oliver MRN: [id number] Date of Birth: Nov 30, 1945  Today's Date: 03/07/2020 Time: SLP Start Time (ACUTE ONLY): 1000 SLP Stop Time (ACUTE ONLY): 1100 SLP Time Calculation (min) (ACUTE ONLY): 60 min  Past Medical History:  Past Medical History:  Diagnosis Date  . Allergic rhinitis   . Arthritis   . 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 nephropathy ([place])    DSME at [facility] 01/2016   . Frequent epistaxis 05/16/2019   S/p cauterization with resolution 2020  . GERD (gastroesophageal reflux disease)   . Hepatic steatosis    by abd [place] 05/2012, mild transaminitis - normal iron sat and viral hep panel (2011), stable [place] 2017  . History of chicken pox   . History of measles   . History of recurrent UTIs    on chronic keflex  . HLD (hyperlipidemia)   . HTN (hypertension)   . 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 Kidney cyst, acquired 01/2016   L kidney by [place]  . Kidney stone 01/2016   L kidney by [place]  . Lung nodules 11/2013   overall stable on f/u CT 01/2016  . Osteopenia 06/2013   mild, forearm T -1.1, hip and spine WNL  . Pancreatitis   . Polycythemia    mild, stable (2013)  . Primary localized osteoarthritis of right knee 01/05/2019  . Rosacea    metrogel   Past Surgical History:  Past Surgical History:  Procedure Laterality Date  . APPENDECTOMY  1987  . BILIARY STENT PLACEMENT  12/14/2019   Procedure: BILIARY STENT PLACEMENT;  Surgeon: [name], MD;  Location: [place];  Service: Endoscopy;;  . BREAST BIOPSY Right 1963   benign  . BREAST BIOPSY Right 08/2014   benign- core  . cardiolite stress test  04/2004   normal  . CESAREAN SECTION  4098;1191   x2  . CHOLECYSTECTOMY  2003  . COLONOSCOPY  09/26/2008   adenomatous polyp, rpt 3-5 yrs  .  COLONOSCOPY  08/2012   adenomatous polyps, diverticulosis, rec rpt 5 yrs [name])  . COLONOSCOPY WITH PROPOFOL N/A 02/05/2018   4TA, SSA, diverticulosis, rpt 3 yrs [name], [name], MD)  . dexa  2003   normal  . dexa  06/2013   [facility] - Tscore -1.1 forearm, normal spine and femur  . ERCP N/A 12/14/2019   Procedure: ENDOSCOPIC RETROGRADE CHOLANGIOPANCREATOGRAPHY (ERCP);  Surgeon: [name], MD;  Location: [place];  Service: Endoscopy;  Laterality: N/A;  . ERCP  01/2020   nonbleeding gastric ulcer - [facility] hospitalization (Dr Mont Dutton)  . ESOPHAGOGASTRODUODENOSCOPY N/A 12/19/2019   Procedure: ESOPHAGOGASTRODUODENOSCOPY (EGD);  Surgeon: [name], MD;  Location: [facility] ENDOSCOPY;  Service: Endoscopy;  Laterality: N/A;  . ESOPHAGOGASTRODUODENOSCOPY  01/2020   pre existing AXIOS cystogastrostomy stent s/p necrosectomy - [facility] hospitalization (Dr Mont Dutton)  . IR FLUORO GUIDE CV LINE RIGHT  12/27/2019  . IR REMOVAL TUN CV CATH W/O FL  01/03/2020  . SPHINCTEROTOMY  12/14/2019   Procedure: SPHINCTEROTOMY;  Surgeon: [name], MD;  Location: [place];  Service: Endoscopy;;  . 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 TRANSTHORACIC ECHOCARDIOGRAM  04/2019   EF 55-60%, modLVH, impaired  relaxation   . TRIGGER FINGER RELEASE  2007;2010;2011   bilateral  . TRIGGER FINGER RELEASE  02/2017  . 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 At [facility] she underwent EUS, ERCP, and  cystogastostomy with placement of AXIOS stent and 10 Fr double pigtail stent across cystogastrostomy on 3/17, EGD and pancreatic necrosectomy on 3/22 and 3/24, pancreatic drain placement on 3/31, video-assisted retropancreatic debridement (VARD) with placement of 22 Fr Malencot drain in L flank and J-tube on 4/9. Patient was discharged to [facility] and now presents with biliary drain displacement (right tube fell out with plan to follow-up with [facility] at discharge), UTI. At [facility] she was originally on clear liquids but then diet was changed to regular at some point but unsure of details of the upgrade. She reports she did not tolerate the Glucerna 1.5 tube feeds well. She reports she developed nausea and she wants to try a different tube feed formula. She reports that she was also given Imodium prn.    Assessment / Plan / Recommendation Clinical Impression  Pt appears to present w/ adequate oropharyngeal phase swallow w/ No oropharyngeal phase dysphagia noted, No neuromuscular deficits noted.Pt consumed po trials of Thin liquids w/ No overt, clinical s/s of aspiration during po trials. Pt appears at reduced risk for aspiration following general aspiration precautions. During po trials, pt consumed thin liquids w/ no overt coughing, decline in vocal quality, or change in respiratory presentation during/post trials. Oral phase appeared [facility] w/ timely bolus management, and control of bolus propulsion for A-P transfer for swallowing. Oral clearing achieved w/ all trials. OM Exam appeared [facility] w/ no unilateral weakness noted. Speech Clear. Pt fed self w/ setup support. Recommend a Clear liquid diet of thin liquidsw/ general aspiration precautions, Pills given Crushed via Feeding Tube(Jtube) as ordered for safer swallowing. NSG to reconsult if any new needs arise. NSG agreed. Dietician updated. SLP Visit Diagnosis: Dysphagia, unspecified (R13.10)(Jtube; reflux)    Aspiration Risk  (reduced  following precautions)    Diet Recommendation  Clear liquids diet (thin consistency) as baseline for pt for [place]; general aspiration and Reflux precautions (pt has a Jtube baseline for nutritional needs)  Medication Administration: Via alternative means(crushed via jtube)    Other  Recommendations Recommended Consults: (Dietician f/u) Oral Care Recommendations: Oral care BID;Oral care before and after PO;Patient independent with oral care Other Recommendations: (n/a)   Follow up Recommendations None      Frequency and Duration (n/a)  (n/a)       Prognosis Prognosis for Safe Diet Advancement: Good Barriers to Reach Goals: (n/a)      Swallow Study   General Date of Onset: 03/06/20 HPI: Pt is a 74 year old female with PMHx of HTN, HLD, arthritis, GERD, DM who was admitted at [facility] 2/9-01/03/2020 for gallstone pancreatitis s/p ERCP biliary stenting on 2/10, then readmitted 01/06/2020 with 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 At [facility] she underwent EUS, ERCP, and cystogastostomy with placement of AXIOS stent and 10 Fr double pigtail  stent across cystogastrostomy on 3/17, EGD and pancreatic necrosectomy on 3/22 and 3/24, pancreatic drain placement on 3/31, video-assisted retropancreatic debridement (VARD) with placement of 22 Fr Malencot drain in L flank and J-tube on 4/9. Patient was discharged to [facility] and now presents with biliary drain displacement (right tube fell out with plan to follow-up with [facility] at discharge), UTI. At [facility] she was originally on clear liquids but then diet was changed to regular at some point but unsure of details of the upgrade. She reports she did not tolerate the Glucerna 1.5 tube feeds well. She reports she developed nausea and she wants to try a different tube feed formula. She reports that she was also given  Imodium prn.  Type of Study: Bedside Swallow Evaluation Previous Swallow Assessment: none reported Diet Prior to this Study: Thin liquids(clears; w/ Jtube TFs) Temperature Spikes Noted: No(wbc 8.5) Respiratory Status: Nasal cannula(2L) History of Recent Intubation: No Behavior/Cognition: Alert;Cooperative;Pleasant mood Oral Cavity Assessment: Within Functional Limits Oral Care Completed by SLP: Yes Oral Cavity - Dentition: Adequate natural dentition Vision: Functional for self-feeding Self-Feeding Abilities: Able to feed self;Needs set up Patient Positioning: Upright in bed(needed min support) Baseline Vocal Quality: Normal Volitional Cough: Strong Volitional Swallow: Able to elicit    Oral/Motor/Sensory Function Overall Oral Motor/Sensory Function: Within functional limits   Ice Chips Ice chips: Within functional limits Presentation: Spoon(fed; 2 trials)   Thin Liquid Thin Liquid: Within functional limits Presentation: Cup;Self Fed;Straw(~4+ozs)    Nectar Thick Nectar Thick Liquid: Not tested   Honey Thick Honey Thick Liquid: Not tested   Puree Puree: Not tested   Solid     Solid: Not tested       Orinda Kenner, MS, CCC-SLP Maicie Vanderloop 03/07/2020,2:27 PM

## 2020-03-07 NOTE — H&P (Signed)
History and Physical    Katherine Oliver JIR:[id number] DOB: 11/15/1945 DOA: 03/06/2020  PCP: [name], MD   Patient coming from: Nursing home I have personally briefly reviewed patient's old medical records in [place]  Chief Complaint: Urinary retention,  HPI: Katherine Oliver is a 74 y.o. 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  She otherwise denied complaints and felt like she was at her baseline.  She denied abdominal pain, fever or chills, nausea or vomiting.  Appetite was preserved.  She was initially tachycardic in the ER with rate about 100.  The emergency room provider contacted the doctors at [facility] who advised that the drain which came out is contagious with the other drain on patient's left as well as with the stomach so there was little concern for fluid collection or obstruction as the biliary fluid could drain elsewhere.  Patient was getting ready to be discharged from the emergency room back to the skilled rehab however she was noted to not have urinated during the duration of her stay in the emergency room.  She could not spontaneously urinate and a catheter was placed with return of purulent appearing urine.  On urinalysis the urine was described as turbid with trace leukocytes and many bacteria.  As far as her blood work, WBC 8.5 and lactic acid 1.1.  She had minor electrolyte abnormalities with sodium of 130, creatinine 1.2, up from baseline of 0.92.  Patient was given a dose of Rocephin hospitalist consulted for admission for treatment of UTI.   Review of Systems: As per HPI otherwise 10 point review of systems negative.    Past Medical History:  Diagnosis Date  . Allergic  rhinitis   . Arthritis   . 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 nephropathy ([place])    DSME at [facility] 01/2016   . Frequent epistaxis 05/16/2019   S/p cauterization with resolution 2020  . GERD (gastroesophageal reflux disease)   . Hepatic steatosis    by abd [place] 05/2012, mild transaminitis - normal iron sat and viral hep panel (2011), stable [place] 2017  . History of chicken pox   . History of measles   . History of recurrent UTIs    on chronic keflex  . HLD (hyperlipidemia)   . HTN (hypertension)   . 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 Kidney cyst, acquired 01/2016   L kidney by [place]  . Kidney stone 01/2016   L kidney by [place]  . Lung nodules 11/2013   overall stable on f/u CT 01/2016  . Osteopenia 06/2013   mild, forearm T -1.1, hip and spine WNL  . Pancreatitis   . Polycythemia    mild, stable (2013)  . Primary localized osteoarthritis of right knee 01/05/2019  . Rosacea    metrogel    Past Surgical History:  Procedure Laterality Date  . APPENDECTOMY  1987  . BILIARY STENT PLACEMENT  12/14/2019   Procedure: BILIARY STENT PLACEMENT;  Surgeon: [name], MD;  Location: [place];  Service: Endoscopy;;  . BREAST BIOPSY Right 1963   benign  .  BREAST BIOPSY Right 08/2014   benign- core  . cardiolite stress test  04/2004   normal  . CESAREAN SECTION  2947;6546   x2  . CHOLECYSTECTOMY  2003  . COLONOSCOPY  09/26/2008   adenomatous polyp, rpt 3-5 yrs  . COLONOSCOPY  08/2012   adenomatous polyps, diverticulosis, rec rpt 5 yrs [name])  . COLONOSCOPY WITH PROPOFOL N/A 02/05/2018   4TA, SSA, diverticulosis, rpt 3 yrs [name], [name], MD)  . dexa  2003   normal  . dexa  06/2013   [facility] - Tscore -1.1 forearm, normal spine and femur  . ERCP N/A 12/14/2019   Procedure: ENDOSCOPIC RETROGRADE CHOLANGIOPANCREATOGRAPHY (ERCP);   Surgeon: [name], MD;  Location: [place];  Service: Endoscopy;  Laterality: N/A;  . ERCP  01/2020   nonbleeding gastric ulcer - [facility] hospitalization (Dr Mont Dutton)  . ESOPHAGOGASTRODUODENOSCOPY N/A 12/19/2019   Procedure: ESOPHAGOGASTRODUODENOSCOPY (EGD);  Surgeon: [name], MD;  Location[facility] ENDOSCOPY;  Service: Endoscopy;  Laterality: N/A;  . ESOPHAGOGASTRODUODENOSCOPY  01/2020   pre existing AXIOS cystogastrostomy stent s/p necrosectomy - [facility] hospitalization (Dr Mont Dutton)  . IR FLUORO GUIDE CV LINE RIGHT  12/27/2019  . IR REMOVAL TUN CV CATH W/O FL  01/03/2020  . SPHINCTEROTOMY  12/14/2019   Procedure: SPHINCTEROTOMY;  Surgeon: [name], MD;  Location: [place];  Service: Endoscopy;;  . 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 TRANSTHORACIC ECHOCARDIOGRAM  04/2019   EF 55-60%, modLVH, impaired relaxation   . TRIGGER FINGER RELEASE  2007;2010;2011   bilateral  . TRIGGER FINGER RELEASE  02/2017  . VAGINAL HYSTERECTOMY  1984   for menorrhagia, ovaries in place     reports that she has never smoked. She has never used smokeless tobacco. She reports previous alcohol use of about 1.0 - 2.0 standard drinks of alcohol per week. She reports that she does not use drugs.  Allergies  Allergen Reactions  . Azithromycin Itching    Okay if takes benadryl along with it  . Nickel     Reaction to cheap earrings  . Sulfa Antibiotics     Itching.   [name] [Acetic Acid]     Nausea.   . Adhesive [Tape] Rash    Paper tape - blisters    Family History  Problem Relation Age of Onset  . Stroke Mother        several  . Hyperlipidemia Mother   . Hypertension Mother   . Cancer Father        colon  . Hypertension Father   . Hyperlipidemia Father   . Coronary artery disease Father 27       MIx1, CABG  . Cancer Paternal Aunt        abdominal   . Coronary artery disease Maternal Grandmother   . Diabetes Maternal Grandfather   . Coronary artery disease Maternal Grandfather   . Breast cancer Neg Hx      Prior to Admission medications   Medication Sig Start Date End Date Taking? Authorizing Provider  acetaminophen (TYLENOL) 160 MG/5ML suspension Place 30.5 mg into feeding tube every 6 (six) hours as needed.   Yes [provider]  amLODIPine Benzoate 1 MG/ML SUSP 2.5 mLs by Per J Tube route daily in the afternoon.   Yes [provider]  benazepril (LOTENSIN) 20 MG tablet 20 mg by Per J Tube route daily.  Yes [provider]  DILTIAZEM HCL PO 7.5 mLs by Per J Tube route.   Yes [provider]  Enoxaparin Sodium (LOVENOX IJ) Inject 70 mg into the muscle every 12 (twelve) hours. 02/24/20 04/24/20 Yes [provider]  fluticasone (FLONASE) 50 MCG/ACT nasal spray Place 1 spray into both nostrils daily.   Yes [provider]  hydrochlorothiazide (HYDRODIURIL) 12.5 MG tablet 12.5 mg by Per J Tube route daily.   Yes [provider]  insulin glargine (LANTUS) 100 UNIT/ML injection Inject 0.1 mLs (10 Units total) into the skin daily. Patient taking differently: Inject 14 Units into the skin at bedtime.  01/13/20  Yes Donne , MD  insulin regular (NOVOLIN R) 100 units/mL injection Inject 0-12 Units into the skin every 6 (six) hours as needed for high blood sugar.   Yes [provider]  Loperamide HCl 1 MG/7.5ML SOLN 7.5 mLs by Per J Tube route every 6 (six) hours as needed (DIARRHEA). 02/24/20 03/25/20 Yes [provider]  melatonin 3 MG TABS tablet Place 3 mg into feeding tube at bedtime. 02/24/20 03/25/20 Yes [provider]  Multiple Vitamin (MULTIVITAMIN) tablet 1 tablet by Per J Tube route daily.   Yes [provider]  omeprazole (FIRST-OMEPRAZOLE) 2 mg/mL SUSP oral suspension Place 10 mg into feeding tube daily.   Yes [provider]   ondansetron (ZOFRAN) 4 MG/5ML solution Take 10 mg by mouth every 6 (six) hours as needed for nausea or vomiting.   Yes [provider]  oxyCODONE (ROXICODONE) 5 MG/5ML solution Place 2.5 mg into feeding tube every 6 (six) hours as needed for moderate pain or severe pain.   Yes [provider]  Pancrelipase, Lip-Prot-Amyl, [phone] units TABS 2 tablets by Per J Tube route 3 (three) times daily with meals.   Yes [provider]  diphenhydrAMINE (BENADRYL) 50 MG/ML injection Inject 0.25 mLs (12.5 mg total) into the vein every 6 (six) hours as needed for itching. Patient not taking: Reported on 03/06/2020 01/12/20   Donne , MD  HYDROmorphone (DILAUDID) 1 MG/ML injection Inject 0.25 mLs (0.25 mg total) into the vein every 2 (two) hours as needed for severe pain. Patient not taking: Reported on 03/06/2020 01/12/20   Donne , MD  HYDROmorphone (DILAUDID) 1 MG/ML injection Inject 0.5 mLs (0.5 mg total) into the vein every 6 (six) hours. Patient not taking: Reported on 03/06/2020 01/12/20   Donne , MD  insulin aspart (NOVOLOG) 100 UNIT/ML injection Inject 0-15 Units into the skin every 4 (four) hours. Patient not taking: Reported on 03/06/2020 01/12/20   Donne , MD  labetalol (NORMODYNE) 5 MG/ML injection Inject 1 mL (5 mg total) into the vein every 2 (two) hours as needed (HR>120, hold sbp<100). Patient not taking: Reported on 03/06/2020 01/12/20   Donne , MD  meropenem 1 g in sodium chloride 0.9 % 100 mL Inject 1 g into the vein every 12 (twelve) hours. Patient not taking: Reported on 03/06/2020 01/12/20   Donne , MD  metoCLOPramide (REGLAN) 5 MG/ML injection Inject 2 mLs (10 mg total) into the vein every 6 (six) hours as needed. Patient not taking: Reported on 03/06/2020 01/12/20   Donne , MD  metoprolol tartrate (LOPRESSOR) 5 MG/5ML SOLN injection Inject 5 mLs (5 mg total) into the vein every 6 (six) hours. Patient not taking:  Reported on 03/06/2020 01/12/20   Donne , MD  naloxone [facility]) 0.4 MG/ML injection Inject 1  mL (0.4 mg total) into the vein as needed (respiratory rate < 10). Patient not taking: Reported on 03/06/2020 01/12/20   Donne , MD  Nutritional Supplements (FEEDING SUPPLEMENT, VITAL AF 1.2 CAL,) LIQD Place 1,000 mLs into feeding tube continuous. 01/12/20   Donne , MD  nystatin cream (MYCOSTATIN) Apply topically 2 (two) times daily. Patient not taking: Reported on 03/06/2020 01/12/20   Donne , MD  ondansetron (ZOFRAN) 4 MG tablet Take 1 tablet (4 mg total) by mouth every 6 (six) hours as needed for nausea. Patient not taking: Reported on 03/06/2020 01/12/20   Donne , MD  ondansetron [facility]) 4 MG/2ML SOLN injection Inject 2 mLs (4 mg total) into the vein every 6 (six) hours as needed for nausea. Patient not taking: Reported on 03/06/2020 01/12/20   Donne , MD  pantoprazole (PROTONIX) 40 MG injection Inject 40 mg into the vein every 12 (twelve) hours. Patient not taking: Reported on 03/06/2020 01/15/20   Donne , MD  promethazine (PHENERGAN) 25 MG/ML injection Inject 0.5 mLs (12.5 mg total) into the vein every 6 (six) hours as needed for nausea or vomiting. Patient not taking: Reported on 03/06/2020 01/12/20   Donne , MD    Physical Exam: Vitals:   03/07/20 0030 03/07/20 0130 03/07/20 0230 03/07/20 0330  BP: 131/83 131/75 (!) 141/81 140/85  Pulse: (!) 109 (!) 108 (!) 107 (!) 112  Resp:      Temp:      TempSrc:      SpO2: 98% 99% 99% 100%  Weight:      Height:         Vitals:   03/07/20 0030 03/07/20 0130 03/07/20 0230 03/07/20 0330  BP: 131/83 131/75 (!) 141/81 140/85  Pulse: (!) 109 (!) 108 (!) 107 (!) 112  Resp:      Temp:      TempSrc:      SpO2: 98% 99% 99% 100%  Weight:      Height:        Constitutional: Alert and awake, oriented x3, not in any acute distress. Eyes: PERLA, EOMI, irises appear normal, anicteric sclera,  ENMT:  external ears and nose appear normal, normal hearing             Lips appears normal, oropharynx mucosa, tongue, posterior pharynx appear normal  Neck: neck appears normal, no masses, normal ROM, no thyromegaly, no JVD  CVS: S1-S2 clear, no murmur rubs or gallops,  , no carotid bruits, pedal pulses palpable, No LE edema Respiratory:  clear to auscultation bilaterally, no wheezing, rales or rhonchi. Respiratory effort normal. No accessory muscle use.  Abdomen: soft nontender, nondistended, normal bowel sounds, drain on left flank.bandage on right flank where tube fell out.no signs of inflammation  PEG tube  Musculoskeletal: : no cyanosis, clubbing , no contractures or atrophy Neuro: No gross neurological deficit Skin: no rashes or lesions or ulcers, no induration or nodules   Labs on Admission: I have personally reviewed following labs and imaging studies  CBC: Recent Labs  Lab 03/06/20 1700  WBC 8.5  NEUTROABS 6.4  HGB 10.9*  HCT 32.7*  MCV 84.5  PLT 829   Basic Metabolic Panel: Recent Labs  Lab 03/06/20 1700  NA 130*  K 5.0  CL 93*  CO2 26  GLUCOSE 175*  BUN 48*  CREATININE 1.20*  CALCIUM 9.4   GFR: Estimated Creatinine Clearance: 41.9 mL/min (A) (by C-G formula based on SCr of 1.2 mg/dL (  H)). Liver Function Tests: Recent Labs  Lab 03/06/20 1700  AST 17  ALT 24  ALKPHOS 235*  BILITOT 0.6  PROT 7.6  ALBUMIN 3.9   Recent Labs  Lab 03/06/20 1700  LIPASE 32   No results for input(s): AMMONIA in the last 168 hours. Coagulation Profile: Recent Labs  Lab 03/06/20 1700  INR 1.0   Cardiac Enzymes: No results for input(s): CKTOTAL, CKMB, CKMBINDEX, TROPONINI in the last 168 hours. BNP (last 3 results) No results for input(s): PROBNP in the last 8760 hours. HbA1C: No results for input(s): HGBA1C in the last 72 hours. CBG: No results for input(s): GLUCAP in the last 168 hours. Lipid Profile: No results for input(s): CHOL, HDL, LDLCALC, TRIG, CHOLHDL,  LDLDIRECT in the last 72 hours. Thyroid Function Tests: No results for input(s): TSH, T4TOTAL, FREET4, T3FREE, THYROIDAB in the last 72 hours. Anemia Panel: No results for input(s): VITAMINB12, FOLATE, FERRITIN, TIBC, IRON, RETICCTPCT in the last 72 hours. Urine analysis:    Component Value Date/Time   COLORURINE YELLOW (A) 03/06/2020 2303   APPEARANCEUR TURBID (A) 03/06/2020 2303   APPEARANCEUR Cloudy (A) 12/12/2019 1332   LABSPEC 1.021 03/06/2020 2303   PHURINE 5.0 03/06/2020 2303   GLUCOSEU NEGATIVE 03/06/2020 2303   HGBUR NEGATIVE 03/06/2020 2303   BILIRUBINUR NEGATIVE 03/06/2020 2303   BILIRUBINUR Negative 12/12/2019 1332   KETONESUR NEGATIVE 03/06/2020 2303   PROTEINUR 30 (A) 03/06/2020 2303   UROBILINOGEN 0.2 02/12/2018 0805   NITRITE NEGATIVE 03/06/2020 2303   LEUKOCYTESUR TRACE (A) 03/06/2020 2303    Radiological Exams on Admission: No results found.  EKG: Independently reviewed.   Assessment/Plan Principal Problem:   UTI (urinary tract infection) -Reported purulent urine -IV Rocephin -Follow cultures    Biliary drain displacement Chronic pancreatitis -Patient is bilateral biliary drains from recent procedure at [facility].  Right tube fell out and patient still has the left tube.  ER provider spoke with doctors at [facility] who said that fine to continue as is per ER provider notes. -To follow-up with [facility] at discharge -Continue Pancrease    Diabetes mellitus type 2, uncontrolled, with complications ([place]) -Fingersticks every 4 with sliding scale coverage.  Patient on tube feeds    HTN (hypertension) -Continue home meds, amlodipine, benazepril    AF (paroxysmal atrial fibrillation) (HCC) -Continue diltiazem.  Not currently on anticoagulation, reason uncertain    Gastrostomy in place [facility]) -Patient on vital AF 1.2 Cal    DVT prophylaxis: Lovenox  Code Status: full code  Family Communication:  none  Disposition Plan: Back to previous home environment Consults  called: none  Status:obs    Athena Masse MD [facility]     03/07/2020, 4:12 AM

## 2020-03-07 NOTE — Telephone Encounter (Signed)
I've heard good things about [facility].

## 2020-03-07 NOTE — ED Notes (Signed)
Urine output 18ml. External catheter removed for transport, brief dry.

## 2020-03-07 NOTE — NC FL2 (Signed)
[place] LEVEL OF CARE SCREENING TOOL     IDENTIFICATION  Patient Name: Katherine Oliver Birthdate: 07/30/1946 Sex: female Admission Date (Current Location): 03/06/2020  [place] and [place] Number:  Engineering geologist and Address:  [facility], [address], [place], [place] [address]      Provider Number: [id number]  Attending Physician Name and Address:  Ezekiel Slocumb, DO  Relative Name and Phone Number:  Kember Boch [phone]    Current Level of Care: Hospital Recommended Level of Care: [place] Prior Approval Number:    Date Approved/Denied:   PASRR Number:    Discharge Plan: SNF    Current Diagnoses: Patient Active Problem List   Diagnosis Date Noted  . UTI (urinary tract infection) 03/07/2020  . Biliary drain displacement 03/07/2020  . Gastrostomy in place [facility]) 03/07/2020  . Chronic pancreatitis ([place]) 01/12/2020  . Acute pancreatitis 01/07/2020  . Ovarian mass, right 01/06/2020  . Acquired renal cyst of left kidney 01/06/2020  . Gastric stress ulcer 01/06/2020  . Acute necrotizing pancreatitis   . AF (paroxysmal atrial fibrillation) ([place])   . Anemia   . AKI (acute kidney injury) ([place]) 12/15/2019  . Choledocholithiasis   . Acute gallstone pancreatitis 12/13/2019  . Primary localized osteoarthritis of right knee 01/05/2019  . 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 Hx of adenomatous polyp of colon 12/15/2017  . Vulvar dermatitis 12/07/2017  . Stress due to illness of family member 08/18/2017  . Left sided abdominal pain 02/05/2017  . Right hip pain 08/07/2016  . CKD stage 3 due to type 2 diabetes mellitus ([place]) 04/04/2016  . Trigger ring finger of right hand 04/04/2016  . Pulmonary nodules 01/03/2016  . Advanced care planning/counseling discussion 07/05/2015  . Obesity, Class I, BMI 30-34.9 07/05/2015  . 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  . Osteopenia 06/03/2013  . Medicare annual wellness visit, subsequent 05/31/2012  . Rotator cuff tendonitis, right 03/01/2012  . Polycythemia 12/02/2011  . Diabetes mellitus type 2, uncontrolled, with complications ([place])   . HTN (hypertension)   . Dyslipidemia associated with type 2 diabetes mellitus ([place])   . History of recurrent UTIs   . Allergic rhinitis   . GERD (gastroesophageal reflux disease)   . Rosacea   . NAFLD (nonalcoholic fatty liver disease) 06/03/2010    Orientation RESPIRATION BLADDER Height & Weight     Self, Place, Situation  O2(2 liters) Incontinent Weight: 141 lb 5 oz (64.1 kg) Height:  5\' 1"  (154.9 cm)  BEHAVIORAL SYMPTOMS/MOOD NEUROLOGICAL BOWEL NUTRITION STATUS      Continent Feeding tube(PEG-Osmolite 1.2 cal 50 ml per hour)  AMBULATORY STATUS COMMUNICATION OF NEEDS Skin   Extensive Assist Verbally Surgical wounds, Other (Comment)(bilanary drain)                       Personal Care Assistance Level of Assistance  Bathing, Feeding, Dressing Bathing Assistance: Limited assistance Feeding assistance: Maximum assistance Dressing Assistance: Limited assistance     Functional Limitations Therapist, nutritional, Speech Sight Info: Impaired   Speech Info: Impaired    SPECIAL CARE FACTORS FREQUENCY  PT (By licensed PT), Speech therapy, OT (By licensed OT)     PT Frequency: 5x week OT Frequency: 5x week     Speech Therapy Frequency: 3-5 week      Contractures Contractures Info: Not present    Additional Factors Info  Code Status, Allergies Code Status Info: Full Code  Allergies Info: Nickel, Sulfa, Vinegar, Adhesive, Azithromycin           Current Medications (03/07/2020):  This is the current hospital active medication list Current Facility-Administered Medications  Medication Dose Route Frequency Provider Last Rate Last Admin  . 0.9 %  sodium chloride infusion   Intravenous Continuous Athena Masse, MD 75 mL/hr at 03/07/20  0658 New Bag at 03/07/20 8811  . acetaminophen (OFIRMEV) IV 1,000 mg  1,000 mg Intravenous Q6H Arta Silence, MD   Stopped at 03/06/20 2119  . acetaminophen (TYLENOL) tablet 650 mg  650 mg Oral Q6H PRN Athena Masse, MD       Or  . acetaminophen (TYLENOL) suppository 650 mg  650 mg Rectal Q6H PRN Athena Masse, MD      . Derrill Memo ON 03/08/2020] cefTRIAXone (ROCEPHIN) 1 g in sodium chloride 0.9 % 100 mL IVPB  1 g Intravenous Q24H Judd Gaudier V, MD      . enoxaparin (LOVENOX) injection 40 mg  40 mg Subcutaneous Q24H Judd Gaudier V, MD      . feeding supplement (OSMOLITE 1.2 CAL) liquid 1,000 mL  1,000 mL Per Tube Continuous Athena Masse, MD      . insulin aspart (novoLOG) injection 0-15 Units  0-15 Units Subcutaneous Q4H Athena Masse, MD   3 Units at 03/07/20 [phone]  . ondansetron (ZOFRAN) tablet 4 mg  4 mg Oral Q6H PRN Athena Masse, MD       Or  . ondansetron [facility]) injection 4 mg  4 mg Intravenous Q6H PRN Athena Masse, MD         Discharge Medications: Please see discharge summary for a list of discharge medications.  Relevant Imaging Results:  Relevant Lab Results:   Additional Information SSN: [id number]  Evalynn Hankins, Gardiner Rhyme, LCSW

## 2020-03-07 NOTE — ED Notes (Signed)
Pt has 155ml urine collected from external catheter. MD made aware.

## 2020-03-07 NOTE — Progress Notes (Signed)
  PROGRESS NOTE    Katherine Oliver  HXT:[id number] DOB: 02/29/1946 DOA: 03/06/2020  PCP: [name], MD    LOS - 0    Patient admitted earlier this AM with urinary tract infection and inability to void.  Interval subjective: Patient seen with husband and son at bedside.  She reports nausea, but not as bad without continuous tube feeds running.  She reports some abdominal pain as well.  No fevers or chills.  Patient reports that, at rehab facility, staff was not caring for her surgical drains as instructed, went two days without them being flushed.    Exam: awake, alert & oriented.  Left side drain in abdomen without surrounding warm, erythema or induration, abdomen tender to palpation.  Heart RRR.  Lungs CTAB.  I have reviewed the full H&P by Dr. Damita Dunnings in detail, and I agree with the assessment and plan as outlined therein.  In addition: --Patient is on therapeutic dose Lovenox for DVT --resume Lovenox 70 mg subcutaneous twice daily --Follow-up urine culture --TOC doing bed search for different rehab  No Charge    Ezekiel Slocumb, DO [facility]   If 7PM-7AM, please contact night-coverage [email or web address] 03/07/2020, 3:15 PM

## 2020-03-07 NOTE — Telephone Encounter (Signed)
Spoke with pt's husband, [name] (on dpr), relaying Dr. Synthia Innocent message.  Expresses his thanks.

## 2020-03-07 NOTE — Progress Notes (Signed)
Initial Nutrition Assessment  DOCUMENTATION CODES:   Not applicable  INTERVENTION:  Initiate Jevity 1.5 Cal at 50 mL/hr. Provides 1800 kcal, 77 grams of protein, 26 grams of fiber, 912 mL H2O daily.  While patient is on IV fluids provide minimum free water flush of 30 mL Q4hrs to maintain tube patency. Once plan is to transition to meet hydration needs enterally recommend resuming free water flush of 150 mL Q6hrs.  NUTRITION DIAGNOSIS:   Inadequate oral intake related to altered GI function(necrotizing pancreatis) as evidenced by (on clear liquid diet, relies on tube feeds via J-tube to meet calorie/protein needs).  GOAL:   Patient will meet greater than or equal to 90% of their needs  MONITOR:   PO intake, Diet advancement, Labs, Weight trends, TF tolerance, I & O's  REASON FOR ASSESSMENT:   Consult Assessment of nutrition requirement/status, Enteral/tube feeding initiation and management  ASSESSMENT:   74 year old female with PMHx of HTN, HLD, arthritis, GERD, DM who was admitted at [facility] 2/9-01/03/2020 for gallstone pancreatitis s/p ERCP biliary stenting on 2/10, then readmitted 01/06/2020 with enlarging necrotic collection in the pancreas that replaced the entire pancreas and was transferred to [facility] on 3/14 for biliary intervention. At [facility] she underwent EUS, ERCP, and cystogastostomy with placement of AXIOS stent and 10 Fr double pigtail stent across cystogastrostomy on 3/17, EGD and pancreatic necrosectomy on 3/22 and 3/24, pancreatic drain placement on 3/31, video-assisted retropancreatic debridement (VARD) with placement of 22 Fr Malencot drain in L flank and J-tube on 4/9. Patient was discharged to [facility] and now presents with biliary drain displacement (right tube fell out with plan to follow-up with [facility] at discharge), UTI.   Reviewed charts from [facility] and [facility]. A post-pyloric Cortrak tube was placed at [facility] for tube feeds prior  to patient transferring to [facility]. At [facility] she remained on post-pyloric feeds until 3/22 when the tube was removed and she was advanced to a low-fat diet. She was then made NPO on 4/5. J-tube was placed on 4/9 and tube feeds were initiated on 4/10. At time of discharge from [place] patient was on Peptamen 1.5 at 45 mL/hr (1620 kcal, 73 grams of protein, 832 mL H2O daily). At [facility] patient was on Glucerna 1.5 Cal at 45 mL/hr (1620 kcal, 89 grams of protein, 17 grams of fiber, 821 mL H2O daily) + free water flush of 150 mL Q6hrs (total of 1421 mL H2O daily including water in tube feeding). At [facility] she was originally on clear liquids but then diet was changed to regular at some point.  Met with patient at bedside. She reports she did not tolerate the Glucerna 1.5 tube feeds well. She reports she developed nausea and she wants to try a different tube feed formula. If patient does not tolerate Jevity 1.5 Cal may need to switch back to a peptide-based formula such as Vital 1.5 Cal. Patient was originally on Creon at [facility] but did not tolerate. She was switched to Viokase per J-tube and tolerated that better. Patient reports her bowel movements had improved overall. At [facility] she was experiencing diarrhea, but she was also on a bowel regimen still. Once the bowel regimen was discontinued and she was started on Viokase she began having a bowel movement every 1-2 days. She reports it was unformed/loose but was not diarrhea. She reports that she was also given Imodium prn.  Patient reports her UBW was around 170 lbs and that she  last weighed this in February. Patient was unsure if she had lost any weight. According to chart patient was 77.6 kg on 12/12/2019. She is now 64.1 kg (141.32 lbs). If current weight is accurate, she has lost 13.5 kg (17.4% body weight) over the past 3 months, which is significant for time frame. However, unsure if this weight is accurate as recent weights in chart have been  higher.  Medications reviewed and include: Novolog 0-15 units Q4hrs, Viokase 41760 units TID per tube, ceftriaxone, NS at 75 mL/hr.  Labs reviewed: CBG 152-197. On 5/4 Sodium 130, Chloride 93, BUN 48, Creatinine 1.2.  Patient was assessed by SLP today. Plan is for patient to be on CLD at this time.   Patient is at risk for malnutrition. However, unsure if current weight in chart is accurate.   NUTRITION - FOCUSED PHYSICAL EXAM:    Most Recent Value  Orbital Region  No depletion  Upper Arm Region  No depletion  Thoracic and Lumbar Region  No depletion  Buccal Region  No depletion  Temple Region  Mild depletion  Clavicle Bone Region  No depletion  Clavicle and Acromion Bone Region  No depletion  Scapular Bone Region  No depletion  Dorsal Hand  No depletion  Patellar Region  Moderate depletion  Anterior Thigh Region  Moderate depletion  Posterior Calf Region  Moderate depletion  Edema (RD Assessment)  None  Hair  Reviewed  Eyes  Reviewed  Mouth  Reviewed  Skin  Reviewed  Nails  Reviewed     Diet Order:   Diet Order            Diet clear liquid Room service appropriate? Yes with Assist; Fluid consistency: Thin  Diet effective now             EDUCATION NEEDS:   No education needs have been identified at this time  Skin:  Skin Assessment: Reviewed RN Assessment  Last BM:  Unknown  Height:   Ht Readings from Last 1 Encounters:  03/07/20 '5\' 1"'$  (1.549 m)   Weight:   Wt Readings from Last 1 Encounters:  03/07/20 64.1 kg   Ideal Body Weight:  47.7 kg  BMI:  Body mass index is 26.7 kg/m.  Estimated Nutritional Needs:   Kcal:  1600-1900  Protein:  75-90 grams  Fluid:  1.6-1.9 L/day  Jacklynn Barnacle, MS, RD, LDN Pager number available on Amion

## 2020-03-07 NOTE — ED Provider Notes (Signed)
I assumed care of the patient from Dr. Cherylann Banas at 11:00 PM with recommendation to follow-up with urinalysis and if infected to admit the patient for IV antibiotic therapy.  Patient's UA consistent with a UTI.  Regarding the patient's urinary retention.  Patient was given 250 mL of normal saline and subsequently was able to void on her own and as such Foley catheter was not inserted.  Patient discussed with Dr. Damita Dunnings for hospital admission further evaluation and management.   Gregor Hams, MD 03/07/20 [phone]

## 2020-03-08 DIAGNOSIS — K861 Other chronic pancreatitis: Secondary | ICD-10-CM

## 2020-03-08 DIAGNOSIS — I480 Paroxysmal atrial fibrillation: Secondary | ICD-10-CM

## 2020-03-08 LAB — BASIC METABOLIC PANEL
Anion gap: 8 (ref 5–15)
BUN: 29 mg/dL — ABNORMAL HIGH (ref 8–23)
CO2: 23 mmol/L (ref 22–32)
Calcium: 8.9 mg/dL (ref 8.9–10.3)
Chloride: 102 mmol/L (ref 98–111)
Creatinine, Ser: 0.86 mg/dL (ref 0.44–1.00)
GFR calc Af Amer: >60 mL/min (ref >60)
GFR calc non Af Amer: >60 mL/min (ref >60)
Glucose, Bld: 172 mg/dL — ABNORMAL HIGH (ref 70–99)
Potassium: 4.3 mmol/L (ref 3.5–5.1)
Sodium: 133 mmol/L — ABNORMAL LOW (ref 135–145)

## 2020-03-08 LAB — GLUCOSE, CAPILLARY
Glucose-Capillary: 104 mg/dL — ABNORMAL HIGH (ref 70–99)
Glucose-Capillary: 199 mg/dL — ABNORMAL HIGH (ref 70–99)
Glucose-Capillary: 185 mg/dL — ABNORMAL HIGH (ref 70–99)
Glucose-Capillary: 252 mg/dL — ABNORMAL HIGH (ref 70–99)
Glucose-Capillary: 192 mg/dL — ABNORMAL HIGH (ref 70–99)
Glucose-Capillary: 174 mg/dL — ABNORMAL HIGH (ref 70–99)

## 2020-03-08 LAB — MAGNESIUM: Magnesium: 1.7 mg/dL (ref 1.7–2.4)

## 2020-03-08 MED ORDER — LOPERAMIDE HCL 2 MG PO CAPS
2.0000 mg | ORAL_CAPSULE | ORAL | Status: DC | PRN
  Administered 2020-03-08 – 2020-03-13 (×5): 2 mg via ORAL
  Filled 2020-03-08 (×6): qty 1

## 2020-03-08 NOTE — Progress Notes (Addendum)
Assumed care of patient at this time

## 2020-03-08 NOTE — Progress Notes (Signed)
PROGRESS NOTE    Katherine Oliver   GNF:[id number]  DOB: 1946-09-10  PCP: [name], MD    DOA: 03/06/2020 LOS: 1   Brief Narrative   Katherine Oliver is a 74 y.o. 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  She otherwise denied complaints and felt like she was at her baseline.  She denied abdominal pain, fever or chills, nausea or vomiting.  Appetite was preserved.  She was initially tachycardic in the ER with rate about 100.  The emergency room provider contacted the doctors at [facility] who advised that the drain which came out is contiguous with the other drain on patient's left as well as with the stomach so there was little concern for fluid collection or obstruction as the biliary fluid could drain elsewhere.  Patient was getting ready to be discharged from the emergency room back to the skilled rehab however she was noted to not have urinated during the duration of her stay in the emergency room.  She could not spontaneously urinate and a catheter was placed with return of purulent appearing urine.  On urinalysis the urine was described as turbid with trace leukocytes and many bacteria.  Labs showed WBC 8.5 and lactic acid 1.1, sodium of 130, creatinine 1.2 (from baseline of 0.92).   Treated in the ED with Rocephin and admitted to hospitalist service for treatment of UTI and acute urinary retention.      Assessment & Plan   Principal Problem:   UTI (urinary tract infection) Active Problems:   Diabetes mellitus type 2, uncontrolled, with complications (HCC)   HTN (hypertension)   NAFLD (nonalcoholic fatty liver disease)   AF (paroxysmal atrial fibrillation) (HCC)   Chronic pancreatitis  (HCC)   Biliary drain displacement   Gastrostomy in place [facility])  UTI (urinary tract infection) - present on admission.  Initial culture grew multiple species.  Patient with history of recurrent UTI's. --send repeat urine culture & follow --continue Rocephin for now  Acute urinary retention - present on admission, resolved.    Intractable nausea - since surgery, is constant when tube feeds running, improves somewhat at slower rate of feeding.  Zofran not very helpful.  Phenergan seems to help more. --PRN antiemetics  Insulin-dependent type 2 diabetes -  Controlled.  Hbg A1c 6.2%.   --Sliding scale Novolog and CBG's q4h (on continuous tube feeds)  Essential hypertension - chronic, stable.   --Continue home amlodipine, benazepril  NAFLD - stable.  No acute issues.   Paroxysmal atrial fibrillation - rate fairly controlled, patient has some tachycardia related to nausea and abdominal pain.   Not on anticoagulation, not clear why at this time. --Continue diltiazem  Chronic pancreatitis - status post VARD and jejunostomy as well as PEG tube placement at [facility] in early April (last month) due to necrotizing pancreatitis.    Biliary drain displacement - was initial reason for ED visit.  ED provider spoke with doctors at [facility] who said that fine to leave this out, since left-sided drain still functional (per ER notes). -Follow-up with [facility] at discharge -Continue Pancrease  Gastrostomy in place - continue tube feeds per dietician orders and PO clear liquids for pleasure.   Patient BMI: Body mass index is 26.7 kg/m.   DVT prophylaxis:  Lovenox  Diet:  Diet Orders (From admission, onward)    Start     Ordered   03/07/20 1122  Diet clear liquid Room service appropriate? Yes with Assist; Fluid consistency: Thin  Diet effective now    Comments: No Red jello, likes all others.  Question Answer Comment  Room service appropriate? Yes with Assist   Fluid consistency: Thin      03/07/20 1123             Code Status: Full Code    Subjective 03/08/20    Patient seen at bedside.  Has had incessant nausea with tube feeds running.  It's a little better with slower rate.  Zofran not very helpful, but says Phenergan helped more.  No fever/chills or worsening abdominal pain.  She reports persistent 6 to 7 out of 10 abdominal pain but okay with just Tylenol, says it's tolerable unless she has to move around.   Disposition Plan & Communication   Status is: Inpatient  Remains inpatient appropriate because:empiric IV antibiotics for UTI are warranted while awaiting urine culture results.     Dispo: The patient is from: SNF              Anticipated d/c is to: SNF              Anticipated d/c date is: 2 days              Patient currently is not medically stable to d/c.   Family Communication: none at bedside, will attempt to call    Consults, Procedures, Significant Events   Consultants:   none  Procedures:   none  Antimicrobials:   Rocephin 5/5 >>   Objective   Vitals:   03/07/20 2001 03/08/20 0439 03/08/20 0441 03/08/20 0943  BP: (!) 153/89 (!) 152/85  (!) 175/98  Pulse: (!) 106 (!) 111 (!) 110 (!) 109  Resp: 20 20    Temp: 97.6 F (36.4 C) (!) 97.5 F (36.4 C)  97.7 F (36.5 C)  TempSrc: Oral Oral  Oral  SpO2: 99% 100% 99% 100%  Weight:      Height:        Intake/Output Summary (Last 24 hours) at 03/08/2020 1409 Last data filed at 03/08/2020 0900 Gross per 24 hour  Intake 848.72 ml  Output 1050 ml  Net -201.28 ml   Filed Weights   03/06/20 1553 03/07/20 0649  Weight: 92.1 kg 64.1 kg    Physical Exam:  General exam: awake, alert, no acute distress, appears mildly ill HEENT: moist mucus membranes, hearing grossly normal  Respiratory system: CTAB, no wheezes, rales or rhonchi, normal respiratory effort. Cardiovascular system: normal S1/S2, RRR, no pedal edema.   Gastrointestinal system: soft, tender on palpation mostly epigastric area, left side  drain in place attached to Foley bag with brown appearing liquid in bag, +bowel sounds. Central nervous system: A&O x4. no gross focal neurologic deficits, normal speech Extremities: moves all, no cyanosis, normal tone Psychiatry: normal mood, congruent affect, judgement and insight appear normal  Labs   Data Reviewed: I have personally reviewed following labs and imaging studies  CBC: Recent Labs  Lab 03/06/20 1700  WBC 8.5  NEUTROABS 6.4  HGB 10.9*  HCT 32.7*  MCV 84.5  PLT 132   Basic Metabolic Panel: Recent Labs  Lab 03/06/20 1700 03/08/20 0538  NA 130* 133*  K 5.0 4.3  CL 93* 102  CO2 26 23  GLUCOSE 175* 172*  BUN 48* 29*  CREATININE 1.20* 0.86  CALCIUM 9.4 8.9  MG  --  1.7   GFR: Estimated Creatinine Clearance: 48.5 mL/min (by C-G formula based on SCr of 0.86 mg/dL). Liver Function Tests: Recent Labs  Lab 03/06/20 1700  AST 17  ALT 24  ALKPHOS 235*  BILITOT 0.6  PROT 7.6  ALBUMIN 3.9   Recent Labs  Lab 03/06/20 1700  LIPASE 32   No results for input(s): AMMONIA in the last 168 hours. Coagulation Profile: Recent Labs  Lab 03/06/20 1700  INR 1.0   Cardiac Enzymes: No results for input(s): CKTOTAL, CKMB, CKMBINDEX, TROPONINI in the last 168 hours. BNP (last 3 results) No results for input(s): PROBNP in the last 8760 hours. HbA1C: Recent Labs    03/06/20 1700  HGBA1C 6.2*   CBG: Recent Labs  Lab 03/07/20 2209 03/08/20 0059 03/08/20 0435 03/08/20 0814 03/08/20 1133  GLUCAP 161* 104* 199* 185* 252*   Lipid Profile: No results for input(s): CHOL, HDL, LDLCALC, TRIG, CHOLHDL, LDLDIRECT in the last 72 hours. Thyroid Function Tests: No results for input(s): TSH, T4TOTAL, FREET4, T3FREE, THYROIDAB in the last 72 hours. Anemia Panel: No results for input(s): VITAMINB12, FOLATE, FERRITIN, TIBC, IRON, RETICCTPCT in the last 72 hours. Sepsis Labs: Recent Labs  Lab 03/06/20 2327  LATICACIDVEN 1.1    Recent Results (from the past 240  hour(s))  Urine culture     Status: Abnormal   Collection Time: 03/06/20 11:03 PM   Specimen: Urine, Clean Catch  Result Value Ref Range Status   Specimen Description   Final    URINE, CLEAN CATCH Performed at [facility], [address], [place], [place] [address]    Special Requests   Final    NONE Performed at [facility], [address] [place], [place] [address]    Culture MULTIPLE SPECIES PRESENT, SUGGEST RECOLLECTION (A)  Final   Report Status 03/07/2020 FINAL  Final  Respiratory Panel by RT PCR (Flu A&B, Covid) - Nasopharyngeal Swab     Status: None   Collection Time: 03/07/20  5:18 AM   Specimen: Nasopharyngeal Swab  Result Value Ref Range Status   SARS Coronavirus 2 by RT PCR NEGATIVE NEGATIVE Final    Comment: (NOTE) SARS-CoV-2 target nucleic acids are NOT DETECTED. The SARS-CoV-2 RNA is generally detectable in upper respiratoy specimens during the acute phase of infection. The lowest concentration of SARS-CoV-2 viral copies this assay can detect is 131 copies/mL. A negative result does not preclude SARS-Cov-2 infection and should not be used as the sole basis for treatment or other patient management decisions. A negative result may occur with  improper specimen collection/handling, submission of specimen other than nasopharyngeal swab, presence of viral mutation(s) within the areas targeted by this assay, and inadequate number of viral copies (<131 copies/mL). A negative result must be combined with clinical observations, patient history, and epidemiological information. The expected result is Negative. Fact Sheet for Patients:  PinkCheek.be Fact Sheet for Healthcare Providers:  GravelBags.it This test is not yet ap proved or cleared by the [place] FDA and  has been authorized for detection and/or diagnosis of SARS-CoV-2 by FDA under an Emergency Use Authorization (EUA). This EUA  will remain  in effect (meaning this test can be used) for the duration of the COVID-19 declaration under Section 564(b)(1) of the Act, 21 U.S.C. section 360bbb-3(b)(1), unless the authorization is terminated or revoked sooner.    Influenza A by PCR NEGATIVE NEGATIVE Final   Influenza B by PCR NEGATIVE  NEGATIVE Final    Comment: (NOTE) The Xpert Xpress SARS-CoV-2/FLU/RSV assay is intended as an aid in  the diagnosis of influenza from Nasopharyngeal swab specimens and  should not be used as a sole basis for treatment. Nasal washings and  aspirates are unacceptable for Xpert Xpress SARS-CoV-2/FLU/RSV  testing. Fact Sheet for Patients: PinkCheek.be Fact Sheet for Healthcare Providers: GravelBags.it This test is not yet approved or cleared by the [place] FDA and  has been authorized for detection and/or diagnosis of SARS-CoV-2 by  FDA under an Emergency Use Authorization (EUA). This EUA will remain  in effect (meaning this test can be used) for the duration of the  Covid-19 declaration under Section 564(b)(1) of the Act, 21  U.S.C. section 360bbb-3(b)(1), unless the authorization is  terminated or revoked. Performed at [facility], [address], [place], [place] [address]       Imaging Studies   No results found.   Medications   Scheduled Meds: . enoxaparin (LOVENOX) injection  1 mg/kg Subcutaneous BID  . free water  30 mL Per Tube Q4H  . insulin aspart  0-15 Units Subcutaneous Q4H  . lipase/protease/amylase)  41,760 Units Per Tube TID   Continuous Infusions: . sodium chloride 75 mL/hr at 03/08/20 0800  . cefTRIAXone (ROCEPHIN)  IV Stopped (03/08/20 0502)  . feeding supplement (JEVITY 1.5 CAL/FIBER) 1,000 mL (03/07/20 1412)       LOS: 1 day    Time spent: 30 minutes    Ezekiel Slocumb, DO [facility]  03/08/2020, 2:09 PM    If 7PM-7AM, please contact night-coverage. How to  contact the [facility] Attending or Consulting provider [place] or covering provider during after hours [place], for this patient?    1. Check the care team in [facility] and look for a) attending/consulting [facility] provider listed and b) the [facility] team listed 2. Log into [email or web address] and use [place]'s universal password to access. If you do not have the password, please contact the hospital operator. 3. Locate the [facility] provider you are looking for under [facility] and page to a number that you can be directly reached. 4. If you still have difficulty reaching the provider, please page the [facility] (Director on Call) for the Hospitalists listed on amion for assistance.

## 2020-03-08 NOTE — Hospital Course (Signed)
Katherine Oliver is a 74 y.o. 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  She otherwise denied complaints and felt like she was at her baseline.  She denied abdominal pain, fever or chills, nausea or vomiting.  Appetite was preserved.  She was initially tachycardic in the ER with rate about 100.  The emergency room provider contacted the doctors at [facility] who advised that the drain which came out is contiguous with the other drain on patient's left as well as with the stomach so there was little concern for fluid collection or obstruction as the biliary fluid could drain elsewhere.  Patient was getting ready to be discharged from the emergency room back to the skilled rehab however she was noted to not have urinated during the duration of her stay in the emergency room.  She could not spontaneously urinate and a catheter was placed with return of purulent appearing urine.  On urinalysis the urine was described as turbid with trace leukocytes and many bacteria.  Labs showed WBC 8.5 and lactic acid 1.1, sodium of 130, creatinine 1.2 (from baseline of 0.92).   Treated in the ED with Rocephin and admitted to hospitalist service for treatment of UTI and acute urinary retention.

## 2020-03-09 LAB — CBC
HCT: 29.9 % — ABNORMAL LOW (ref 36.0–46.0)
Hemoglobin: 9.6 g/dL — ABNORMAL LOW (ref 12.0–15.0)
MCH: 27.9 pg (ref 26.0–34.0)
MCHC: 32.1 g/dL (ref 30.0–36.0)
MCV: 86.9 fL (ref 80.0–100.0)
Platelets: 146 10*3/uL — ABNORMAL LOW (ref 150–400)
RBC: 3.44 MIL/uL — ABNORMAL LOW (ref 3.87–5.11)
RDW: 14.9 % (ref 11.5–15.5)
WBC: 4.2 10*3/uL (ref 4.0–10.5)
nRBC: 0 % (ref 0.0–0.2)

## 2020-03-09 LAB — BASIC METABOLIC PANEL
Anion gap: 8 (ref 5–15)
BUN: 17 mg/dL (ref 8–23)
CO2: 23 mmol/L (ref 22–32)
Calcium: 9 mg/dL (ref 8.9–10.3)
Chloride: 103 mmol/L (ref 98–111)
Creatinine, Ser: 0.64 mg/dL (ref 0.44–1.00)
GFR calc Af Amer: >60 mL/min (ref >60)
GFR calc non Af Amer: >60 mL/min (ref >60)
Glucose, Bld: 208 mg/dL — ABNORMAL HIGH (ref 70–99)
Potassium: 5 mmol/L (ref 3.5–5.1)
Sodium: 134 mmol/L — ABNORMAL LOW (ref 135–145)

## 2020-03-09 LAB — MAGNESIUM: Magnesium: 1.5 mg/dL — ABNORMAL LOW (ref 1.7–2.4)

## 2020-03-09 LAB — GLUCOSE, CAPILLARY
Glucose-Capillary: 158 mg/dL — ABNORMAL HIGH (ref 70–99)
Glucose-Capillary: 160 mg/dL — ABNORMAL HIGH (ref 70–99)
Glucose-Capillary: 168 mg/dL — ABNORMAL HIGH (ref 70–99)
Glucose-Capillary: 172 mg/dL — ABNORMAL HIGH (ref 70–99)
Glucose-Capillary: 177 mg/dL — ABNORMAL HIGH (ref 70–99)
Glucose-Capillary: 191 mg/dL — ABNORMAL HIGH (ref 70–99)
Glucose-Capillary: 213 mg/dL — ABNORMAL HIGH (ref 70–99)

## 2020-03-09 MED ORDER — MAGNESIUM SULFATE 2 GM/50ML IV SOLN
2.0000 g | Freq: Once | INTRAVENOUS | Status: AC
  Administered 2020-03-09: 2 g via INTRAVENOUS
  Filled 2020-03-09: qty 50

## 2020-03-09 MED ORDER — PHENAZOPYRIDINE HCL 200 MG PO TABS
200.0000 mg | ORAL_TABLET | Freq: Three times a day (TID) | ORAL | Status: DC
  Administered 2020-03-09 – 2020-03-12 (×7): 200 mg via ORAL
  Filled 2020-03-09 (×10): qty 1

## 2020-03-09 MED ORDER — SODIUM CHLORIDE 0.9 % IV SOLN
1.0000 g | Freq: Three times a day (TID) | INTRAVENOUS | Status: DC
  Administered 2020-03-09 – 2020-03-10 (×4): 1 g via INTRAVENOUS
  Filled 2020-03-09 (×5): qty 1

## 2020-03-09 MED ORDER — ESTROGENS, CONJUGATED 0.625 MG/GM VA CREA
1.0000 | TOPICAL_CREAM | Freq: Every day | VAGINAL | Status: DC
  Administered 2020-03-09 – 2020-03-13 (×5): 1 via VAGINAL
  Filled 2020-03-09: qty 30

## 2020-03-09 NOTE — Progress Notes (Signed)
Pharmacy Antibiotic Note  Katherine Oliver is a 74 y.o. female admitted on 03/06/2020 with UTI.  Pharmacy has been consulted for Meropenem dosing. - per MD pt not improving on Ceftriaxone, rechecking Ucx  Plan: Meropenem 1 gm IV q8h      Crcl 52   Height: 5\' 1"  (154.9 cm) Weight: 64.8 kg (142 lb 13.7 oz) IBW/kg (Calculated) : 47.8  Temp (24hrs), Avg:97.7 F (36.5 C), Min:97.4 F (36.3 C), Max:98 F (36.7 C)  Recent Labs  Lab 03/06/20 1700 03/06/20 2327 03/08/20 0538 03/09/20 0457  WBC 8.5  --   --  4.2  CREATININE 1.20*  --  0.86 0.64  LATICACIDVEN  --  1.1  --   --     Estimated Creatinine Clearance: 52.4 mL/min (by C-G formula based on SCr of 0.64 mg/dL).    Allergies  Allergen Reactions  . Azithromycin Itching    Okay if takes benadryl along with it  . Nickel     Reaction to cheap earrings  . Sulfa Antibiotics     Itching.   [name] [Acetic Acid]     Nausea.   . Adhesive [Tape] Rash    Paper tape - blisters    Antimicrobials this admission: CTx 5/5 >> 5/7 Meropenem  5/7 >>    Dose adjustments this admission:    Microbiology results:  BCx:   5/4 UCx: multiple species 5/7 repeat Ucx    Sputum:      MRSA PCR: sent  Thank you for allowing pharmacy to be a part of this patient's care.  Thomas Mabry A 03/09/2020 10:26 AM

## 2020-03-09 NOTE — Progress Notes (Addendum)
PROGRESS NOTE    Katherine Oliver   PFX:[id number]  DOB: 1945-12-08  PCP: [name], MD    DOA: 03/06/2020 LOS: 2   Brief Narrative   Katherine Oliver is a 74 y.o. 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  She otherwise denied complaints and felt like she was at her baseline.  She denied abdominal pain, fever or chills, nausea or vomiting.  Appetite was preserved.  She was initially tachycardic in the ER with rate about 100.  The emergency room provider contacted the doctors at [facility] who advised that the drain which came out is contiguous with the other drain on patient's left as well as with the stomach so there was little concern for fluid collection or obstruction as the biliary fluid could drain elsewhere.  Patient was getting ready to be discharged from the emergency room back to the skilled rehab however she was noted to not have urinated during the duration of her stay in the emergency room.  She could not spontaneously urinate and a catheter was placed with return of purulent appearing urine.  On urinalysis the urine was described as turbid with trace leukocytes and many bacteria.  Labs showed WBC 8.5 and lactic acid 1.1, sodium of 130, creatinine 1.2 (from baseline of 0.92).   Treated in the ED with Rocephin and admitted to hospitalist service for treatment of UTI and acute urinary retention.      Assessment & Plan   Principal Problem:   UTI (urinary tract infection) Active Problems:   Diabetes mellitus type 2, uncontrolled, with complications (HCC)   HTN (hypertension)   NAFLD (nonalcoholic fatty liver disease)   AF (paroxysmal atrial fibrillation) (HCC)   Chronic pancreatitis  (HCC)   Biliary drain displacement   Gastrostomy in place [facility])  UTI (urinary tract infection) - present on admission.  Initial culture grew multiple species.  Patient with history of recurrent UTI's.  Still symptomatic with dysuria after 2 days Rocephin and nothing back on repeat urine culture yet.  Patient follows with urology, was told they may put her on maintenance Macrobid but this has not been done yet.  Prior urine cultures grew enterococcus. --follow repeat urine culture  --switch to meropenem until cultures result --add pyridium and resume Premarin vaginal cream --touch base with urology re any recommendations or prophylactic Macrobid recommendation  Hypomagnesemia - Mg 1.5 this AM, replacing by IV.  Recheck in AM.  Replace as needed, goal Mg > 2.0.  Acute urinary retention - present on admission, resolved.    Intractable nausea - since surgery, is constant when tube feeds running, improves somewhat at slower rate of feeding.  Zofran not very helpful.  Phenergan seems to help more. --PRN antiemetics  Insulin-dependent type 2 diabetes -  Controlled.  Hbg A1c 6.2%.   --Sliding scale Novolog and CBG's q4h (on continuous tube feeds)  Essential hypertension - chronic, stable.   --Continue home amlodipine, benazepril  NAFLD - stable.  No acute issues.   Paroxysmal atrial fibrillation - rate fairly controlled, patient has some tachycardia related to nausea and abdominal pain.   Not on anticoagulation, not clear why at this time. --Continue diltiazem  Chronic pancreatitis - status post VARD and jejunostomy as well as PEG tube placement at  [facility] in early April (last month) due to necrotizing pancreatitis.    Biliary drain displacement - was initial reason for ED visit.  ED provider spoke with doctors at [facility] who said that fine to leave this out, since left-sided drain still functional (per ER notes). -Follow-up with [facility] at discharge -Continue Pancrease  Gastrostomy in place -  continue tube feeds per dietician orders and PO clear liquids for pleasure.   Patient BMI: Body mass index is 26.99 kg/m.   DVT prophylaxis: Lovenox  Diet:  Diet Orders (From admission, onward)    Start     Ordered   03/07/20 1122  Diet clear liquid Room service appropriate? Yes with Assist; Fluid consistency: Thin  Diet effective now    Comments: No Red jello, likes all others.  Question Answer Comment  Room service appropriate? Yes with Assist   Fluid consistency: Thin      03/07/20 1123            Code Status: Full Code    Subjective 03/09/20    Patient seen at bedside.  States she is still having dysuria, and felt like she had to really push in order to urinate earlier.  Says she sees Dr. Diamantina Providence and had been told they may start prophylactic Macrobid given her frequent recurrent UTI's.  No fever or chills.  Nausea controlled with alternating zofran and phenergan and slower tube feed rate.     Disposition Plan & Communication   Status is: Inpatient  Remains inpatient appropriate because:empiric IV antibiotics for UTI are warranted while awaiting urine culture results.     Dispo: The patient is from: SNF              Anticipated d/c is to: SNF              Anticipated d/c date is: 2 days              Patient currently is not medically stable to d/c.   Family Communication: none at bedside, will attempt to call    Consults, Procedures, Significant Events   Consultants:   none  Procedures:   none  Antimicrobials:   Rocephin 5/5 >> 5/7  Meropenem 5/7 >>  Objective   Vitals:   03/08/20 1638 03/08/20 2336 03/09/20 0500 03/09/20 0821  BP: (!) 162/86 (!) 165/89  (!) 163/87  Pulse: 100 (!) 106  (!) 105  Resp:  17  18  Temp: (!) 97.4 F (36.3 C) 98 F (36.7 C)  97.7 F (36.5 C)  TempSrc: Oral   Oral  SpO2: 99% 100%  99%  Weight:   64.8 kg   Height:        Intake/Output Summary (Last 24 hours) at 03/09/2020 1249 Last data filed at 03/09/2020  1040 Gross per 24 hour  Intake 3398.27 ml  Output 750 ml  Net 2648.27 ml   Filed Weights   03/06/20 1553 03/07/20 0649 03/09/20 0500  Weight: 92.1 kg 64.1 kg 64.8 kg    Physical Exam:  General exam: awake, alert, no acute distress, appears mildly ill Respiratory system: CTAB, no wheezes, rales or rhonchi, normal respiratory effort. Cardiovascular system: normal S1/S2, RRR, no pedal edema.   Gastrointestinal system: soft, epigastric tenderness stable, left side drain in place attached to Foley bag with brown appearing liquid in bag, +bowel sounds. Central nervous system: A&O x4. no gross focal neurologic deficits, normal speech Extremities: moves all, no cyanosis, normal tone Psychiatry: normal mood, congruent affect, judgement and  insight appear normal  Labs   Data Reviewed: I have personally reviewed following labs and imaging studies  CBC: Recent Labs  Lab 03/06/20 1700 03/09/20 0457  WBC 8.5 4.2  NEUTROABS 6.4  --   HGB 10.9* 9.6*  HCT 32.7* 29.9*  MCV 84.5 86.9  PLT 262 188*   Basic Metabolic Panel: Recent Labs  Lab 03/06/20 1700 03/08/20 0538 03/09/20 0457  NA 130* 133* 134*  K 5.0 4.3 5.0  CL 93* 102 103  CO2 26 23 23   GLUCOSE 175* 172* 208*  BUN 48* 29* 17  CREATININE 1.20* 0.86 0.64  CALCIUM 9.4 8.9 9.0  MG  --  1.7 1.5*   GFR: Estimated Creatinine Clearance: 52.4 mL/min (by C-G formula based on SCr of 0.64 mg/dL). Liver Function Tests: Recent Labs  Lab 03/06/20 1700  AST 17  ALT 24  ALKPHOS 235*  BILITOT 0.6  PROT 7.6  ALBUMIN 3.9   Recent Labs  Lab 03/06/20 1700  LIPASE 32   No results for input(s): AMMONIA in the last 168 hours. Coagulation Profile: Recent Labs  Lab 03/06/20 1700  INR 1.0   Cardiac Enzymes: No results for input(s): CKTOTAL, CKMB, CKMBINDEX, TROPONINI in the last 168 hours. BNP (last 3 results) No results for input(s): PROBNP in the last 8760 hours. HbA1C: Recent Labs    03/06/20 1700  HGBA1C 6.2*    CBG: Recent Labs  Lab 03/08/20 2001 03/09/20 0038 03/09/20 0523 03/09/20 0844 03/09/20 1225  GLUCAP 174* 168* 191* 172* 213*   Lipid Profile: No results for input(s): CHOL, HDL, LDLCALC, TRIG, CHOLHDL, LDLDIRECT in the last 72 hours. Thyroid Function Tests: No results for input(s): TSH, T4TOTAL, FREET4, T3FREE, THYROIDAB in the last 72 hours. Anemia Panel: No results for input(s): VITAMINB12, FOLATE, FERRITIN, TIBC, IRON, RETICCTPCT in the last 72 hours. Sepsis Labs: Recent Labs  Lab 03/06/20 2327  LATICACIDVEN 1.1    Recent Results (from the past 240 hour(s))  Urine culture     Status: Abnormal   Collection Time: 03/06/20 11:03 PM   Specimen: Urine, Clean Catch  Result Value Ref Range Status   Specimen Description   Final    URINE, CLEAN CATCH Performed at [facility], [address] [place], [place] [address]    Special Requests   Final    NONE Performed at [facility], [address] [place], [place] [address]    Culture MULTIPLE SPECIES PRESENT, SUGGEST RECOLLECTION (A)  Final   Report Status 03/07/2020 FINAL  Final  Respiratory Panel by RT PCR (Flu A&B, Covid) - Nasopharyngeal Swab     Status: None   Collection Time: 03/07/20  5:18 AM   Specimen: Nasopharyngeal Swab  Result Value Ref Range Status   SARS Coronavirus 2 by RT PCR NEGATIVE NEGATIVE Final    Comment: (NOTE) SARS-CoV-2 target nucleic acids are NOT DETECTED. The SARS-CoV-2 RNA is generally detectable in upper respiratoy specimens during the acute phase of infection. The lowest concentration of SARS-CoV-2 viral copies this assay can detect is 131 copies/mL. A negative result does not preclude SARS-Cov-2 infection and should not be used as the sole basis for treatment or other patient management decisions. A negative result may occur with  improper specimen collection/handling, submission of specimen other than nasopharyngeal swab, presence of viral mutation(s) within  the areas targeted by this assay, and inadequate number of viral copies (<131 copies/mL). A negative result must be combined with clinical observations, patient history, and epidemiological information. The expected result  is Negative. Fact Sheet for Patients:  PinkCheek.be Fact Sheet for Healthcare Providers:  GravelBags.it This test is not yet ap proved or cleared by the [place] FDA and  has been authorized for detection and/or diagnosis of SARS-CoV-2 by FDA under an Emergency Use Authorization (EUA). This EUA will remain  in effect (meaning this test can be used) for the duration of the COVID-19 declaration under Section 564(b)(1) of the Act, 21 U.S.C. section 360bbb-3(b)(1), unless the authorization is terminated or revoked sooner.    Influenza A by PCR NEGATIVE NEGATIVE Final   Influenza B by PCR NEGATIVE NEGATIVE Final    Comment: (NOTE) The Xpert Xpress SARS-CoV-2/FLU/RSV assay is intended as an aid in  the diagnosis of influenza from Nasopharyngeal swab specimens and  should not be used as a sole basis for treatment. Nasal washings and  aspirates are unacceptable for Xpert Xpress SARS-CoV-2/FLU/RSV  testing. Fact Sheet for Patients: PinkCheek.be Fact Sheet for Healthcare Providers: GravelBags.it This test is not yet approved or cleared by the [place] FDA and  has been authorized for detection and/or diagnosis of SARS-CoV-2 by  FDA under an Emergency Use Authorization (EUA). This EUA will remain  in effect (meaning this test can be used) for the duration of the  Covid-19 declaration under Section 564(b)(1) of the Act, 21  U.S.C. section 360bbb-3(b)(1), unless the authorization is  terminated or revoked. Performed at [facility], [address] [place], [place] [address]       Imaging Studies   No results found.   Medications    Scheduled Meds: . enoxaparin (LOVENOX) injection  1 mg/kg Subcutaneous BID  . free water  30 mL Per Tube Q4H  . insulin aspart  0-15 Units Subcutaneous Q4H  . lipase/protease/amylase)  41,760 Units Per Tube TID   Continuous Infusions: . feeding supplement (JEVITY 1.5 CAL/FIBER) 1,000 mL (03/07/20 1412)  . magnesium sulfate bolus IVPB    . meropenem (MERREM) IV 1 g (03/09/20 1158)       LOS: 2 days    Time spent: 30 minutes    Ezekiel Slocumb, DO [facility]  03/09/2020, 12:49 PM    If 7PM-7AM, please contact night-coverage. How to contact the [facility] Attending or Consulting provider [place] or covering provider during after hours [place], for this patient?    1. Check the care team in [facility] and look for a) attending/consulting TRH provider listed and b) the [facility] team listed 2. Log into [email or web address] and use [place]'s universal password to access. If you do not have the password, please contact the hospital operator. 3. Locate the [facility] provider you are looking for under [facility] and page to a number that you can be directly reached. 4. If you still have difficulty reaching the provider, please page the [facility] (Director on Call) for the Hospitalists listed on amion for assistance.

## 2020-03-09 NOTE — Care Management Important Message (Signed)
Important Message  Patient Details  Name: Katherine Oliver MRN: [id number] Date of Birth: 1946-05-19   Medicare Important Message Given:  N/A - LOS <3 / Initial given by admissions     Juliann Pulse A Shaliyah Taite 03/09/2020, 10:04 AM

## 2020-03-09 NOTE — Plan of Care (Signed)

## 2020-03-09 NOTE — TOC Progression Note (Addendum)
Transition of Care [facility]) - Progression Note    Patient Details  Name: Katherine Oliver MRN: [id number] Date of Birth: 01-11-1946  Transition of Care [facility]) CM/SW Contact  Skylur Fuston, Gardiner Rhyme, LCSW Phone Number: 03/09/2020, 2:38 PM  Clinical Narrative:   Have spoken with pt and husband who would like for pt to go to [facility] if they will accept her. Have reached out to call [name] to see if can take pt. Husband plans on short term and has LTC policy and private funds to pay if needed. Bed search expanded and will wait [name] return call.  4:15 pm still awaiting [name] to call back have texted and left message. Pt and husband aware of this. Will need new COVID test before transferring to SNF       Expected Discharge Plan and Services                                                 Social Determinants of Health (SDOH) Interventions    Readmission Risk Interventions No flowsheet data found.

## 2020-03-10 LAB — BASIC METABOLIC PANEL
Anion gap: 7 (ref 5–15)
BUN: 15 mg/dL (ref 8–23)
CO2: 24 mmol/L (ref 22–32)
Calcium: 8.9 mg/dL (ref 8.9–10.3)
Chloride: 103 mmol/L (ref 98–111)
Creatinine, Ser: 0.63 mg/dL (ref 0.44–1.00)
GFR calc Af Amer: >60 mL/min (ref >60)
GFR calc non Af Amer: >60 mL/min (ref >60)
Glucose, Bld: 171 mg/dL — ABNORMAL HIGH (ref 70–99)
Potassium: 4.7 mmol/L (ref 3.5–5.1)
Sodium: 134 mmol/L — ABNORMAL LOW (ref 135–145)

## 2020-03-10 LAB — GLUCOSE, CAPILLARY
Glucose-Capillary: 179 mg/dL — ABNORMAL HIGH (ref 70–99)
Glucose-Capillary: 196 mg/dL — ABNORMAL HIGH (ref 70–99)
Glucose-Capillary: 139 mg/dL — ABNORMAL HIGH (ref 70–99)
Glucose-Capillary: 194 mg/dL — ABNORMAL HIGH (ref 70–99)
Glucose-Capillary: 184 mg/dL — ABNORMAL HIGH (ref 70–99)

## 2020-03-10 LAB — URINE CULTURE

## 2020-03-10 LAB — RESPIRATORY PANEL BY RT PCR (FLU A&B, COVID)
Influenza A by PCR: NEGATIVE
Influenza B by PCR: NEGATIVE
SARS Coronavirus 2 by RT PCR: NEGATIVE

## 2020-03-10 LAB — MAGNESIUM: Magnesium: 1.8 mg/dL (ref 1.7–2.4)

## 2020-03-10 MED ORDER — BENAZEPRIL HCL 20 MG PO TABS
20.0000 mg | ORAL_TABLET | Freq: Every day | ORAL | Status: DC
  Administered 2020-03-10 – 2020-03-13 (×4): 20 mg
  Filled 2020-03-10 (×4): qty 1

## 2020-03-10 MED ORDER — DILTIAZEM HCL 30 MG PO TABS
30.0000 mg | ORAL_TABLET | Freq: Three times a day (TID) | ORAL | Status: DC
  Administered 2020-03-10 – 2020-03-13 (×9): 30 mg
  Filled 2020-03-10 (×9): qty 1

## 2020-03-10 MED ORDER — ADULT MULTIVITAMIN W/MINERALS CH
1.0000 | ORAL_TABLET | Freq: Every day | ORAL | Status: DC
  Administered 2020-03-10 – 2020-03-13 (×4): 1
  Filled 2020-03-10 (×4): qty 1

## 2020-03-10 MED ORDER — AMLODIPINE BESYLATE 5 MG PO TABS: 5.0000 mg | ORAL_TABLET | Freq: Every day | ORAL | Status: DC

## 2020-03-10 MED ORDER — FLUTICASONE PROPIONATE 50 MCG/ACT NA SUSP
1.0000 | Freq: Every day | NASAL | Status: DC
  Administered 2020-03-10 – 2020-03-13 (×4): 1 via NASAL
  Filled 2020-03-10: qty 16

## 2020-03-10 MED ORDER — AMLODIPINE BESYLATE 2.5 MG PO TABS
2.5000 mg | ORAL_TABLET | Freq: Every day | ORAL | Status: DC
  Administered 2020-03-10 – 2020-03-12 (×2): 2.5 mg
  Filled 2020-03-10 (×2): qty 1

## 2020-03-10 MED ORDER — OXYCODONE HCL 5 MG/5ML PO SOLN
2.5000 mg | Freq: Four times a day (QID) | ORAL | Status: DC | PRN
  Administered 2020-03-12 – 2020-03-13 (×2): 2.5 mg
  Filled 2020-03-10 (×2): qty 5

## 2020-03-10 NOTE — TOC Progression Note (Addendum)
Transition of Care [facility]) - Progression Note    Patient Details  Name: Katherine Oliver MRN: [id number] Date of Birth: 1946-09-15  Transition of [facility]) CM/SW Contact  Boris Sharper, LCSW Phone Number: 03/10/2020, 2:30 PM  Clinical Narrative:    CSW contacted and spoke to [name] at [facility] and she stated that the pt has been denied. CSW called and spoke to pt and her husband and notified them of the available options. They were not interested in going with the three facilities presented [facility]). Pt's husband suggested [name] in [place] contacted and spoke to the receptionist who provided the Fax number and connected CSW  to the voicemail for admissions. CSW left voicemail and faxed over referral for review to [phone]. Will continue to follow up.          Expected Discharge Plan and Services                                                 Social Determinants of Health (SDOH) Interventions    Readmission Risk Interventions No flowsheet data found.

## 2020-03-10 NOTE — Progress Notes (Addendum)
PROGRESS NOTE    Katherine Oliver   LGX:[id number]  DOB: 1946/04/07  PCP: [name], MD    DOA: 03/06/2020 LOS: 3   Brief Narrative   Katherine Oliver is a 74 y.o. 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  She otherwise denied complaints and felt like she was at her baseline.  She denied abdominal pain, fever or chills, nausea or vomiting.  Appetite was preserved.  She was initially tachycardic in the ER with rate about 100.  The emergency room provider contacted the doctors at [facility] who advised that the drain which came out is contiguous with the other drain on patient's left as well as with the stomach so there was little concern for fluid collection or obstruction as the biliary fluid could drain elsewhere.  Patient was getting ready to be discharged from the emergency room back to the skilled rehab however she was noted to not have urinated during the duration of her stay in the emergency room.  She could not spontaneously urinate and a catheter was placed with return of purulent appearing urine.  On urinalysis the urine was described as turbid with trace leukocytes and many bacteria.  Labs showed WBC 8.5 and lactic acid 1.1, sodium of 130, creatinine 1.2 (from baseline of 0.92).   Treated in the ED with Rocephin and admitted to hospitalist service for treatment of UTI and acute urinary retention.      Assessment & Plan   Principal Problem:   UTI (urinary tract infection) Active Problems:   Diabetes mellitus type 2, uncontrolled, with complications (HCC)   HTN (hypertension)   NAFLD (nonalcoholic fatty liver disease)   AF (paroxysmal atrial fibrillation) (HCC)   Chronic pancreatitis  (HCC)   Biliary drain displacement   Gastrostomy in place [facility])  UTI (urinary tract infection) - present on admission.  Initial culture grew multiple species.  Patient with history of recurrent UTI's, follows with urology, was told they may put her on maintenance Macrobid but this has not been done yet.  Prior urine cultures grew enterococcus.  Still symptomatic with dysuria after 2 days Rocephin, so changed to meropenem.  Repeat cultures also grew multiple species.  Dysuria improved after change in antibiotic and addition of pyridium and Premarin yesterday.  She may be colonized with bacteria and having dysuria related to atrophic vaginitis(?). --stop antibiotics and monitor, has received 3 days treatment   --continue pyridium and Premarin cream  --touch base with urology re any recommendations or prophylactic Macrobid recommendation  Hypomagnesemia - replaced.  Recheck in AM.  Replace as needed, goal Mg > 2.0.  Acute urinary retention - present on admission, resolved.    Intractable nausea - since surgery, is constant when tube feeds running, improves somewhat at slower rate of feeding.  Zofran not very helpful.  Phenergan seems to help more. --PRN antiemetics, alternate the two if needed  Insulin-dependent type 2 diabetes -  Controlled.  Hbg A1c 6.2%.   --Sliding scale Novolog and CBG's q4h (on continuous tube feeds)  Essential hypertension - chronic, stable.   --Continue home amlodipine, benazepril --HCTZ held  NAFLD - stable.  No acute issues.   Paroxysmal atrial fibrillation - rate fairly controlled, patient has some tachycardia related to nausea and abdominal pain.  Not on anticoagulation, not clear why at this time. --Continue diltiazem  Chronic pancreatitis - status post VARD and jejunostomy as well as PEG tube placement at [facility] in early April (last month) due to necrotizing pancreatitis.    Biliary drain displacement - was initial reason for ED visit.  ED provider spoke with  doctors at [facility] who said that fine to leave this out, since left-sided drain still functional (per ER notes). -Follow-up with [facility] at discharge -Continue Pancrease  Gastrostomy in place - continue tube feeds per dietician orders and PO clear liquids for pleasure.   Patient BMI: Body mass index is 26.99 kg/m.   DVT prophylaxis: Lovenox  Diet:  Diet Orders (From admission, onward)    Start     Ordered   03/07/20 1122  Diet clear liquid Room service appropriate? Yes with Assist; Fluid consistency: Thin  Diet effective now    Comments: No Red jello, likes all others.  Question Answer Comment  Room service appropriate? Yes with Assist   Fluid consistency: Thin      03/07/20 1123            Code Status: Full Code    Subjective 03/10/20    Patient seen at bedside.  Says dysuria resolved with changes yesterday (pyridium and Premarin cream).  No fevers or chills.  Still constant nausea but this is unchanged.  Abdominal pain stable, usually at 6-7 of 10, reports adequate pain control with Tylenol.   Disposition Plan & Communication   Status is: Inpatient  Remains inpatient appropriate because: she is awaiting rehab placement, as patient and family unhappy with care at prior rehab facility.  Regarding her UTI, she requires 24 additional hours monitoring clinically off antibiotics.    Dispo: The patient is from: SNF              Anticipated d/c is to: SNF              Anticipated d/c date is: 2 days              Patient currently is not medically stable to d/c.   Family Communication: none at bedside, will attempt to call    Consults, Procedures, Significant Events   Consultants:   none  Procedures:   none  Antimicrobials:   Rocephin 5/5 >> 5/7  Meropenem 5/7 >> 5/8  Objective   Vitals:   03/09/20 0821 03/09/20 1841 03/09/20 2350 03/10/20 0945  BP: (!) 163/87 (!) 154/88 (!) 160/80 (!) 163/99  Pulse: (!) 105 (!) 108 (!) 109 (!) 108  Resp: 18 18 20 18   Temp:  97.7 F (36.5 C) (!) 97.3 F (36.3 C) 97.8 F (36.6 C) 97.6 F (36.4 C)  TempSrc: Oral Oral  Oral  SpO2: 99% 99% 99% 99%  Weight:      Height:        Intake/Output Summary (Last 24 hours) at 03/10/2020 1258 Last data filed at 03/10/2020 1004 Gross per 24 hour  Intake 470 ml  Output 1325 ml  Net -855 ml   Filed Weights   03/06/20 1553 03/07/20 0649 03/09/20 0500  Weight: 92.1 kg 64.1 kg 64.8 kg    Physical Exam:  General exam: awake, alert, no acute distress, appears mildly ill Respiratory system: CTAB, normal respiratory effort. Cardiovascular system: normal S1/S2, RRR, no pedal edema.   Gastrointestinal system: soft, tenderness stable, left side drain in place attached to Foley bag with brown appearing liquid in bag. Central nervous system: A&O x4.  no gross focal neurologic deficits, normal speech Extremities: moves all, no cyanosis, normal tone Psychiatry: normal mood, congruent affect, judgement and insight appear normal  Labs   Data Reviewed: I have personally reviewed following labs and imaging studies  CBC: Recent Labs  Lab 03/06/20 1700 03/09/20 0457  WBC 8.5 4.2  NEUTROABS 6.4  --   HGB 10.9* 9.6*  HCT 32.7* 29.9*  MCV 84.5 86.9  PLT 262 962*   Basic Metabolic Panel: Recent Labs  Lab 03/06/20 1700 03/08/20 0538 03/09/20 0457 03/10/20 0545  NA 130* 133* 134* 134*  K 5.0 4.3 5.0 4.7  CL 93* 102 103 103  CO2 26 23 23 24   GLUCOSE 175* 172* 208* 171*  BUN 48* 29* 17 15  CREATININE 1.20* 0.86 0.64 0.63  CALCIUM 9.4 8.9 9.0 8.9  MG  --  1.7 1.5* 1.8   GFR: Estimated Creatinine Clearance: 52.4 mL/min (by C-G formula based on SCr of 0.63 mg/dL). Liver Function Tests: Recent Labs  Lab 03/06/20 1700  AST 17  ALT 24  ALKPHOS 235*  BILITOT 0.6  PROT 7.6  ALBUMIN 3.9   Recent Labs  Lab 03/06/20 1700  LIPASE 32   No results for input(s): AMMONIA in the last 168 hours. Coagulation Profile: Recent Labs  Lab 03/06/20 1700  INR 1.0   Cardiac  Enzymes: No results for input(s): CKTOTAL, CKMB, CKMBINDEX, TROPONINI in the last 168 hours. BNP (last 3 results) No results for input(s): PROBNP in the last 8760 hours. HbA1C: No results for input(s): HGBA1C in the last 72 hours. CBG: Recent Labs  Lab 03/09/20 1949 03/09/20 2347 03/10/20 0406 03/10/20 0843 03/10/20 1216  GLUCAP 177* 158* 179* 196* 139*   Lipid Profile: No results for input(s): CHOL, HDL, LDLCALC, TRIG, CHOLHDL, LDLDIRECT in the last 72 hours. Thyroid Function Tests: No results for input(s): TSH, T4TOTAL, FREET4, T3FREE, THYROIDAB in the last 72 hours. Anemia Panel: No results for input(s): VITAMINB12, FOLATE, FERRITIN, TIBC, IRON, RETICCTPCT in the last 72 hours. Sepsis Labs: Recent Labs  Lab 03/06/20 2327  LATICACIDVEN 1.1    Recent Results (from the past 240 hour(s))  Urine culture     Status: Abnormal   Collection Time: 03/06/20 11:03 PM   Specimen: Urine, Clean Catch  Result Value Ref Range Status   Specimen Description   Final    URINE, CLEAN CATCH Performed at [facility], [address], [place], [place] [address]    Special Requests   Final    NONE Performed at [facility], [address] [place], [place] [address]    Culture MULTIPLE SPECIES PRESENT, SUGGEST RECOLLECTION (A)  Final   Report Status 03/07/2020 FINAL  Final  Respiratory Panel by RT PCR (Flu A&B, Covid) - Nasopharyngeal Swab     Status: None   Collection Time: 03/07/20  5:18 AM   Specimen: Nasopharyngeal Swab  Result Value Ref Range Status   SARS Coronavirus 2 by RT PCR NEGATIVE NEGATIVE Final    Comment: (NOTE) SARS-CoV-2 target nucleic acids are NOT DETECTED. The SARS-CoV-2 RNA is generally detectable in upper respiratoy specimens during the acute phase of infection. The lowest concentration of SARS-CoV-2 viral copies this assay can detect is 131 copies/mL. A negative result does not preclude SARS-Cov-2 infection and should not be used as the sole  basis for treatment or other patient management decisions. A negative result may occur with  improper specimen collection/handling, submission of specimen other than nasopharyngeal swab, presence of viral mutation(s) within the  areas targeted by this assay, and inadequate number of viral copies (<131 copies/mL). A negative result must be combined with clinical observations, patient history, and epidemiological information. The expected result is Negative. Fact Sheet for Patients:  PinkCheek.be Fact Sheet for Healthcare Providers:  GravelBags.it This test is not yet ap proved or cleared by the [place] FDA and  has been authorized for detection and/or diagnosis of SARS-CoV-2 by FDA under an Emergency Use Authorization (EUA). This EUA will remain  in effect (meaning this test can be used) for the duration of the COVID-19 declaration under Section 564(b)(1) of the Act, 21 U.S.C. section 360bbb-3(b)(1), unless the authorization is terminated or revoked sooner.    Influenza A by PCR NEGATIVE NEGATIVE Final   Influenza B by PCR NEGATIVE NEGATIVE Final    Comment: (NOTE) The Xpert Xpress SARS-CoV-2/FLU/RSV assay is intended as an aid in  the diagnosis of influenza from Nasopharyngeal swab specimens and  should not be used as a sole basis for treatment. Nasal washings and  aspirates are unacceptable for Xpert Xpress SARS-CoV-2/FLU/RSV  testing. Fact Sheet for Patients: PinkCheek.be Fact Sheet for Healthcare Providers: GravelBags.it This test is not yet approved or cleared by the [place] FDA and  has been authorized for detection and/or diagnosis of SARS-CoV-2 by  FDA under an Emergency Use Authorization (EUA). This EUA will remain  in effect (meaning this test can be used) for the duration of the  Covid-19 declaration under Section 564(b)(1) of the Act, 21  U.S.C.  section 360bbb-3(b)(1), unless the authorization is  terminated or revoked. Performed at [facility], [address] [place], [place] [address]   Urine Culture     Status: Abnormal   Collection Time: 03/09/20  4:15 AM   Specimen: Urine, Clean Catch  Result Value Ref Range Status   Specimen Description   Final    URINE, CLEAN CATCH Performed at [facility], [address] [place], [place] [address]    Special Requests   Final    NONE Performed at [facility], [address] [place], [place] [address]    Culture MULTIPLE SPECIES PRESENT, SUGGEST RECOLLECTION (A)  Final   Report Status 03/10/2020 FINAL  Final      Imaging Studies   No results found.   Medications   Scheduled Meds: . conjugated estrogens  1 Applicatorful Vaginal Daily  . enoxaparin (LOVENOX) injection  1 mg/kg Subcutaneous BID  . free water  30 mL Per Tube Q4H  . insulin aspart  0-15 Units Subcutaneous Q4H  . lipase/protease/amylase)  41,760 Units Per Tube TID  . phenazopyridine  200 mg Oral TID WC   Continuous Infusions: . feeding supplement (JEVITY 1.5 CAL/FIBER) 1,000 mL (03/07/20 1412)  . meropenem (MERREM) IV 1 g (03/10/20 0430)       LOS: 3 days    Time spent: 30 minutes    Ezekiel Slocumb, DO [facility]  03/10/2020, 12:58 PM    If 7PM-7AM, please contact night-coverage. How to contact the [facility] Attending or Consulting provider [place] or covering provider during after hours [place], for this patient?    1. Check the care team in [facility] and look for a) attending/consulting [facility] provider listed and b) the [facility] team listed 2. Log into [email or web address] and use [place] universal password to access. If you do not have the password, please contact the hospital operator. 3. Locate the [facility] provider you are looking for under [facility] and page  to a number that you can be directly reached. 4. If you still have difficulty reaching the provider,  please page the [facility] (Director on Call) for the Hospitalists listed on amion for assistance.

## 2020-03-11 DIAGNOSIS — Z931 Gastrostomy status: Secondary | ICD-10-CM

## 2020-03-11 LAB — CBC
HCT: 29.2 % — ABNORMAL LOW (ref 36.0–46.0)
Hemoglobin: 9.7 g/dL — ABNORMAL LOW (ref 12.0–15.0)
MCH: 28 pg (ref 26.0–34.0)
MCHC: 33.2 g/dL (ref 30.0–36.0)
MCV: 84.1 fL (ref 80.0–100.0)
Platelets: 157 10*3/uL (ref 150–400)
RBC: 3.47 MIL/uL — ABNORMAL LOW (ref 3.87–5.11)
RDW: 14.8 % (ref 11.5–15.5)
WBC: 4.6 10*3/uL (ref 4.0–10.5)
nRBC: 0 % (ref 0.0–0.2)

## 2020-03-11 LAB — BASIC METABOLIC PANEL
Anion gap: 6 (ref 5–15)
BUN: 14 mg/dL (ref 8–23)
CO2: 26 mmol/L (ref 22–32)
Calcium: 9 mg/dL (ref 8.9–10.3)
Chloride: 101 mmol/L (ref 98–111)
Creatinine, Ser: 0.54 mg/dL (ref 0.44–1.00)
GFR calc Af Amer: >60 mL/min (ref >60)
GFR calc non Af Amer: >60 mL/min (ref >60)
Glucose, Bld: 168 mg/dL — ABNORMAL HIGH (ref 70–99)
Potassium: 4.5 mmol/L (ref 3.5–5.1)
Sodium: 133 mmol/L — ABNORMAL LOW (ref 135–145)

## 2020-03-11 LAB — GLUCOSE, CAPILLARY
Glucose-Capillary: 159 mg/dL — ABNORMAL HIGH (ref 70–99)
Glucose-Capillary: 178 mg/dL — ABNORMAL HIGH (ref 70–99)
Glucose-Capillary: 188 mg/dL — ABNORMAL HIGH (ref 70–99)
Glucose-Capillary: 209 mg/dL — ABNORMAL HIGH (ref 70–99)
Glucose-Capillary: 225 mg/dL — ABNORMAL HIGH (ref 70–99)
Glucose-Capillary: 239 mg/dL — ABNORMAL HIGH (ref 70–99)

## 2020-03-11 LAB — PHOSPHORUS: Phosphorus: 3.1 mg/dL (ref 2.5–4.6)

## 2020-03-11 LAB — MAGNESIUM: Magnesium: 1.5 mg/dL — ABNORMAL LOW (ref 1.7–2.4)

## 2020-03-11 MED ORDER — MAGNESIUM SULFATE 4 GM/100ML IV SOLN
4.0000 g | Freq: Once | INTRAVENOUS | Status: AC
  Administered 2020-03-11: 08:00:00 4 g via INTRAVENOUS
  Filled 2020-03-11: qty 100

## 2020-03-11 MED ORDER — METOCLOPRAMIDE HCL 5 MG/ML IJ SOLN
10.0000 mg | Freq: Four times a day (QID) | INTRAMUSCULAR | Status: DC | PRN
  Administered 2020-03-11 – 2020-03-12 (×3): 10 mg via INTRAVENOUS
  Filled 2020-03-11 (×3): qty 2

## 2020-03-11 NOTE — Progress Notes (Addendum)
PROGRESS NOTE    Katherine Oliver   DEY:[id number]  DOB: April 06, 1946  PCP: [name], MD    DOA: 03/06/2020 LOS: 4   Brief Narrative   Katherine Oliver is a 74 y.o. 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  She otherwise denied complaints and felt like she was at her baseline.  She denied abdominal pain, fever or chills, nausea or vomiting.  Appetite was preserved.  She was initially tachycardic in the ER with rate about 100.  The emergency room provider contacted the doctors at [facility] who advised that the drain which came out is contiguous with the other drain on patient's left as well as with the stomach so there was little concern for fluid collection or obstruction as the biliary fluid could drain elsewhere.  Patient was getting ready to be discharged from the emergency room back to the skilled rehab however she was noted to not have urinated during the duration of her stay in the emergency room.  She could not spontaneously urinate and a catheter was placed with return of purulent appearing urine.  On urinalysis the urine was described as turbid with trace leukocytes and many bacteria.  Labs showed WBC 8.5 and lactic acid 1.1, sodium of 130, creatinine 1.2 (from baseline of 0.92).   Treated in the ED with Rocephin and admitted to hospitalist service for treatment of UTI and acute urinary retention.      Assessment & Plan   Principal Problem:   UTI (urinary tract infection) Active Problems:   Diabetes mellitus type 2, uncontrolled, with complications (HCC)   HTN (hypertension)   NAFLD (nonalcoholic fatty liver disease)   AF (paroxysmal atrial fibrillation) (HCC)   Chronic pancreatitis  (HCC)   Biliary drain displacement   Gastrostomy in place [facility])  UTI (urinary tract infection) - present on admission.  Initial culture grew multiple species.  Patient with history of recurrent UTI's, follows with urology, was told they may put her on maintenance Macrobid but this has not been done yet.  Prior urine cultures grew enterococcus.  Still symptomatic with dysuria after 2 days Rocephin, so changed to meropenem.  Repeat cultures also grew multiple species.  Dysuria improved after change in antibiotic and addition of pyridium and Premarin yesterday.  She may be colonized with bacteria and having dysuria related to atrophic vaginitis(?). --stopped antibiotics 5/8 and monitor (received 3 days treatment) --continue pyridium and Premarin cream (can be given just topical, not internal) --touch base with urology re any recommendations or prophylactic Macrobid recommendation  Hypomagnesemia - replaced.  Recheck in AM.  Replace as needed, goal Mg > 2.0.  Acute urinary retention - present on admission, resolved.    Intractable nausea - since surgery, is constant when tube feeds running, improves somewhat at slower rate of feeding.  Zofran not very helpful.  Phenergan seems to help more but burns. --PRN antiemetics, alternate the two if needed --will try Reglan in place of Phenergan, continue Zofran, alternate as needed  Insulin-dependent type 2 diabetes -  Controlled.  Hbg A1c 6.2%.   --Sliding scale Novolog and CBG's q4h (on continuous tube feeds)  Essential hypertension - chronic, stable.   --Continue home amlodipine, benazepril --HCTZ held  NAFLD - stable.  No acute issues.  Paroxysmal atrial fibrillation - rate fairly controlled, patient has some tachycardia related to nausea and abdominal pain.   Not on anticoagulation, not clear why at this time. --Continue diltiazem  Chronic pancreatitis - status post VARD and jejunostomy as well as PEG tube placement at [facility] in early April (last  month) due to necrotizing pancreatitis.    Biliary drain displacement - was initial reason for ED visit.  ED provider spoke with doctors at [facility] who said that fine to leave this out, since left-sided drain still functional (per ER notes). -Follow-up with [facility] at discharge -Continue Pancrease  Gastrostomy in place - continue tube feeds per dietician orders and PO clear liquids for pleasure.   Patient BMI: Body mass index is 26.99 kg/m.   DVT prophylaxis: Lovenox  Diet:  Diet Orders (From admission, onward)    Start     Ordered   03/07/20 1122  Diet clear liquid Room service appropriate? Yes with Assist; Fluid consistency: Thin  Diet effective now    Comments: No Red jello, likes all others.  Question Answer Comment  Room service appropriate? Yes with Assist   Fluid consistency: Thin      03/07/20 1123            Code Status: Full Code    Subjective 03/11/20    Patient seen at bedside, husband and son present.  Continues to have constant nausea, phenergan burns in IV.  She denies any return of dysuria or urinary frequency since stopping antibiotics yesterday afternoon.  No fevers or chills.   Disposition Plan & Communication   Status is: Inpatient  Remains inpatient appropriate because: she is awaiting rehab placement, as patient and family unhappy with care at prior rehab facility.      Dispo: The patient is from: SNF              Anticipated d/c is to: SNF              Anticipated d/c date is: 2 days              Patient currently medically stable for discharge.   Family Communication: husband and son at bedside during encounter updated and all questions answered.  They are in agreement with the plan as outlined.    Consults, Procedures, Significant Events   Consultants:   none  Procedures:   none  Antimicrobials:   Rocephin 5/5 >> 5/7  Meropenem 5/7 >> 5/8  Objective   Vitals:   03/11/20 0037 03/11/20 0207 03/11/20 0404 03/11/20 0800  BP:   128/79 130/68 127/75  Pulse: (!) 109 (!) 101 99 98  Resp:  20 19 16   Temp:  98.7 F (37.1 C) 97.9 F (36.6 C) 98.2 F (36.8 C)  TempSrc:  Oral  Oral  SpO2: 97% 99% 99% 99%  Weight:      Height:        Intake/Output Summary (Last 24 hours) at 03/11/2020 1353 Last data filed at 03/11/2020 0830 Gross per 24 hour  Intake 1667 ml  Output 1750 ml  Net -83 ml   Filed Weights   03/06/20 1553 03/07/20 0649 03/09/20 0500  Weight: 92.1 kg 64.1 kg 64.8 kg    Physical Exam:  General exam: awake, alert, no acute distress, no acute distress Respiratory system: CTAB, normal respiratory effort. Cardiovascular system: normal S1/S2, RRR, no pedal edema.   Gastrointestinal system: soft, tenderness stable, left side drain in place attached to Foley bag with brown appearing liquid in  bag. Central nervous system: A&O x4. no gross focal neurologic deficits, normal speech Extremities: moves all, no cyanosis, normal tone Psychiatry: normal mood, congruent affect, judgement and insight appear normal  Labs   Data Reviewed: I have personally reviewed following labs and imaging studies  CBC: Recent Labs  Lab 03/06/20 1700 03/09/20 0457 03/11/20 0431  WBC 8.5 4.2 4.6  NEUTROABS 6.4  --   --   HGB 10.9* 9.6* 9.7*  HCT 32.7* 29.9* 29.2*  MCV 84.5 86.9 84.1  PLT 262 146* 401   Basic Metabolic Panel: Recent Labs  Lab 03/06/20 1700 03/08/20 0538 03/09/20 0457 03/10/20 0545 03/11/20 0431  NA 130* 133* 134* 134* 133*  K 5.0 4.3 5.0 4.7 4.5  CL 93* 102 103 103 101  CO2 26 23 23 24 26   GLUCOSE 175* 172* 208* 171* 168*  BUN 48* 29* 17 15 14   CREATININE 1.20* 0.86 0.64 0.63 0.54  CALCIUM 9.4 8.9 9.0 8.9 9.0  MG  --  1.7 1.5* 1.8 1.5*  PHOS  --   --   --   --  3.1   GFR: Estimated Creatinine Clearance: 52.4 mL/min (by C-G formula based on SCr of 0.54 mg/dL). Liver Function Tests: Recent Labs  Lab 03/06/20 1700  AST 17  ALT 24  ALKPHOS 235*  BILITOT 0.6  PROT 7.6  ALBUMIN 3.9    Recent Labs  Lab 03/06/20 1700  LIPASE 32   No results for input(s): AMMONIA in the last 168 hours. Coagulation Profile: Recent Labs  Lab 03/06/20 1700  INR 1.0   Cardiac Enzymes: No results for input(s): CKTOTAL, CKMB, CKMBINDEX, TROPONINI in the last 168 hours. BNP (last 3 results) No results for input(s): PROBNP in the last 8760 hours. HbA1C: No results for input(s): HGBA1C in the last 72 hours. CBG: Recent Labs  Lab 03/10/20 1941 03/11/20 0000 03/11/20 0401 03/11/20 0802 03/11/20 1125  GLUCAP 184* 209* 159* 225* 178*   Lipid Profile: No results for input(s): CHOL, HDL, LDLCALC, TRIG, CHOLHDL, LDLDIRECT in the last 72 hours. Thyroid Function Tests: No results for input(s): TSH, T4TOTAL, FREET4, T3FREE, THYROIDAB in the last 72 hours. Anemia Panel: No results for input(s): VITAMINB12, FOLATE, FERRITIN, TIBC, IRON, RETICCTPCT in the last 72 hours. Sepsis Labs: Recent Labs  Lab 03/06/20 2327  LATICACIDVEN 1.1    Recent Results (from the past 240 hour(s))  Urine culture     Status: Abnormal   Collection Time: 03/06/20 11:03 PM   Specimen: Urine, Clean Catch  Result Value Ref Range Status   Specimen Description   Final    URINE, CLEAN CATCH Performed at [facility], [address], [place], [place] [address]    Special Requests   Final    NONE Performed at [facility], [address] [place], [place] [address]    Culture MULTIPLE SPECIES PRESENT, SUGGEST RECOLLECTION (A)  Final   Report Status 03/07/2020 FINAL  Final  Respiratory Panel by RT PCR (Flu A&B, Covid) - Nasopharyngeal Swab     Status: None   Collection Time: 03/07/20  5:18 AM   Specimen: Nasopharyngeal Swab  Result Value Ref Range Status   SARS Coronavirus 2 by RT PCR NEGATIVE NEGATIVE Final    Comment: (NOTE) SARS-CoV-2 target nucleic acids are NOT DETECTED. The SARS-CoV-2 RNA is generally detectable in upper respiratoy specimens during the acute phase of infection.  The lowest concentration of SARS-CoV-2 viral copies this assay can detect is 131 copies/mL. A negative result does not  preclude SARS-Cov-2 infection and should not be used as the sole basis for treatment or other patient management decisions. A negative result may occur with  improper specimen collection/handling, submission of specimen other than nasopharyngeal swab, presence of viral mutation(s) within the areas targeted by this assay, and inadequate number of viral copies (<131 copies/mL). A negative result must be combined with clinical observations, patient history, and epidemiological information. The expected result is Negative. Fact Sheet for Patients:  PinkCheek.be Fact Sheet for Healthcare Providers:  GravelBags.it This test is not yet ap proved or cleared by the [place] FDA and  has been authorized for detection and/or diagnosis of SARS-CoV-2 by FDA under an Emergency Use Authorization (EUA). This EUA will remain  in effect (meaning this test can be used) for the duration of the COVID-19 declaration under Section 564(b)(1) of the Act, 21 U.S.C. section 360bbb-3(b)(1), unless the authorization is terminated or revoked sooner.    Influenza A by PCR NEGATIVE NEGATIVE Final   Influenza B by PCR NEGATIVE NEGATIVE Final    Comment: (NOTE) The Xpert Xpress SARS-CoV-2/FLU/RSV assay is intended as an aid in  the diagnosis of influenza from Nasopharyngeal swab specimens and  should not be used as a sole basis for treatment. Nasal washings and  aspirates are unacceptable for Xpert Xpress SARS-CoV-2/FLU/RSV  testing. Fact Sheet for Patients: PinkCheek.be Fact Sheet for Healthcare Providers: GravelBags.it This test is not yet approved or cleared by the [place] FDA and  has been authorized for detection and/or diagnosis of SARS-CoV-2 by  FDA under an  Emergency Use Authorization (EUA). This EUA will remain  in effect (meaning this test can be used) for the duration of the  Covid-19 declaration under Section 564(b)(1) of the Act, 21  U.S.C. section 360bbb-3(b)(1), unless the authorization is  terminated or revoked. Performed at [facility], [address], [place], [place] [address]   Urine Culture     Status: Abnormal   Collection Time: 03/09/20  4:15 AM   Specimen: Urine, Clean Catch  Result Value Ref Range Status   Specimen Description   Final    URINE, CLEAN CATCH Performed at [name], [address], [place], [place] [address]    Special Requests   Final    NONE Performed at [facility], [address] [place], [place] [address]    Culture MULTIPLE SPECIES PRESENT, SUGGEST RECOLLECTION (A)  Final   Report Status 03/10/2020 FINAL  Final  Respiratory Panel by RT PCR (Flu A&B, Covid) - Nasopharyngeal Swab     Status: None   Collection Time: 03/10/20  1:37 PM   Specimen: Nasopharyngeal Swab  Result Value Ref Range Status   SARS Coronavirus 2 by RT PCR NEGATIVE NEGATIVE Final    Comment: (NOTE) SARS-CoV-2 target nucleic acids are NOT DETECTED. The SARS-CoV-2 RNA is generally detectable in upper respiratoy specimens during the acute phase of infection. The lowest concentration of SARS-CoV-2 viral copies this assay can detect is 131 copies/mL. A negative result does not preclude SARS-Cov-2 infection and should not be used as the sole basis for treatment or other patient management decisions. A negative result may occur with  improper specimen collection/handling, submission of specimen other than nasopharyngeal swab, presence of viral mutation(s) within the areas targeted by this assay, and inadequate number of viral copies (<131 copies/mL). A negative result must be combined with clinical observations, patient history, and epidemiological information. The expected result is  Negative. Fact Sheet for Patients:  PinkCheek.be Fact  Sheet for Healthcare Providers:  GravelBags.it This test is not yet ap proved or cleared by the [place] FDA and  has been authorized for detection and/or diagnosis of SARS-CoV-2 by FDA under an Emergency Use Authorization (EUA). This EUA will remain  in effect (meaning this test can be used) for the duration of the COVID-19 declaration under Section 564(b)(1) of the Act, 21 U.S.C. section 360bbb-3(b)(1), unless the authorization is terminated or revoked sooner.    Influenza A by PCR NEGATIVE NEGATIVE Final   Influenza B by PCR NEGATIVE NEGATIVE Final    Comment: (NOTE) The Xpert Xpress SARS-CoV-2/FLU/RSV assay is intended as an aid in  the diagnosis of influenza from Nasopharyngeal swab specimens and  should not be used as a sole basis for treatment. Nasal washings and  aspirates are unacceptable for Xpert Xpress SARS-CoV-2/FLU/RSV  testing. Fact Sheet for Patients: PinkCheek.be Fact Sheet for Healthcare Providers: GravelBags.it This test is not yet approved or cleared by the [place] FDA and  has been authorized for detection and/or diagnosis of SARS-CoV-2 by  FDA under an Emergency Use Authorization (EUA). This EUA will remain  in effect (meaning this test can be used) for the duration of the  Covid-19 declaration under Section 564(b)(1) of the Act, 21  U.S.C. section 360bbb-3(b)(1), unless the authorization is  terminated or revoked. Performed at [facility], [address] [place], [place] [address]       Imaging Studies   No results found.   Medications   Scheduled Meds: . amLODipine  2.5 mg Per Tube Q1500  . benazepril  20 mg Per Tube Daily  . conjugated estrogens  1 Applicatorful Vaginal Daily  . diltiazem  30 mg Per Tube Q8H  . enoxaparin (LOVENOX) injection  1 mg/kg  Subcutaneous BID  . fluticasone  1 spray Each Nare Daily  . free water  30 mL Per Tube Q4H  . insulin aspart  0-15 Units Subcutaneous Q4H  . lipase/protease/amylase)  41,760 Units Per Tube TID  . multivitamin with minerals  1 tablet Per Tube Daily  . phenazopyridine  200 mg Oral TID WC   Continuous Infusions: . feeding supplement (JEVITY 1.5 CAL/FIBER) 1,000 mL (03/10/20 1802)       LOS: 4 days    Time spent: 30 minutes    Ezekiel Slocumb, DO [facility]  03/11/2020, 1:53 PM    If 7PM-7AM, please contact night-coverage. How to contact the [facility] Attending or Consulting provider [place] or covering provider during after hours [place], for this patient?    1. Check the care team in [facility] and look for a) attending/consulting [facility] provider listed and b) the [facility] team listed 2. Log into [email or web address] and use [place]'s universal password to access. If you do not have the password, please contact the hospital operator. 3. Locate the [facility] provider you are looking for under [facility] and page to a number that you can be directly reached. 4. If you still have difficulty reaching the provider, please page the [facility] (Director on Call) for the Hospitalists listed on amion for assistance.

## 2020-03-11 NOTE — Progress Notes (Signed)
   03/11/20 0002  Assess: MEWS Score  Temp (!) 97.4 F (36.3 C)  BP (!) 147/74  Pulse Rate (!) 112  Resp 20  SpO2 99 %  O2 Device Nasal Cannula  O2 Flow Rate (L/min) 2 L/min  Assess: MEWS Score  MEWS Temp 0  MEWS Systolic 0  MEWS Pulse 2  MEWS RR 0  MEWS LOC 0  MEWS Score 2  MEWS Score Color Yellow  Assess: if the MEWS score is Yellow or Red  Were vital signs taken at a resting state? Yes  Focused Assessment Documented focused assessment  Early Detection of Sepsis Score *See Row Information* Medium  MEWS guidelines implemented *See Row Information* Yes  Treat  MEWS Interventions Other (Comment) (cardizem given 45 min before VS taken)  Take Vital Signs  Increase Vital Sign Frequency  Yellow: Q 2hr X 2 then Q 4hr X 2, if remains yellow, continue Q 4hrs  Escalate  MEWS: Escalate Yellow: discuss with charge nurse/RN and consider discussing with provider and RRT  Notify: Charge Nurse/RN  Name of Charge Nurse/RN Notified Butch Penny, RN  Date Charge Nurse/RN Notified 03/11/20  Time Charge Nurse/RN Notified 0025  Document  Patient Outcome Other (Comment) (continue to monitor)

## 2020-03-12 LAB — GLUCOSE, CAPILLARY
Glucose-Capillary: 173 mg/dL — ABNORMAL HIGH (ref 70–99)
Glucose-Capillary: 175 mg/dL — ABNORMAL HIGH (ref 70–99)
Glucose-Capillary: 175 mg/dL — ABNORMAL HIGH (ref 70–99)
Glucose-Capillary: 228 mg/dL — ABNORMAL HIGH (ref 70–99)
Glucose-Capillary: 243 mg/dL — ABNORMAL HIGH (ref 70–99)
Glucose-Capillary: 247 mg/dL — ABNORMAL HIGH (ref 70–99)

## 2020-03-12 LAB — BASIC METABOLIC PANEL
Anion gap: 8 (ref 5–15)
BUN: 15 mg/dL (ref 8–23)
CO2: 26 mmol/L (ref 22–32)
Calcium: 9 mg/dL (ref 8.9–10.3)
Chloride: 99 mmol/L (ref 98–111)
Creatinine, Ser: 0.6 mg/dL (ref 0.44–1.00)
GFR calc Af Amer: >60 mL/min (ref >60)
GFR calc non Af Amer: >60 mL/min (ref >60)
Glucose, Bld: 181 mg/dL — ABNORMAL HIGH (ref 70–99)
Potassium: 4.7 mmol/L (ref 3.5–5.1)
Sodium: 133 mmol/L — ABNORMAL LOW (ref 135–145)

## 2020-03-12 LAB — SARS CORONAVIRUS 2 BY RT PCR (HOSPITAL ORDER, PERFORMED IN ~~LOC~~ HOSPITAL LAB): SARS Coronavirus 2: NEGATIVE

## 2020-03-12 LAB — MAGNESIUM: Magnesium: 2.1 mg/dL (ref 1.7–2.4)

## 2020-03-12 MED ORDER — MECLIZINE HCL 25 MG PO TABS
25.0000 mg | ORAL_TABLET | Freq: Three times a day (TID) | ORAL | Status: DC | PRN
  Administered 2020-03-12 – 2020-03-13 (×2): 25 mg via ORAL
  Filled 2020-03-12 (×3): qty 1

## 2020-03-12 MED ORDER — METOCLOPRAMIDE HCL 5 MG PO TABS
10.0000 mg | ORAL_TABLET | Freq: Four times a day (QID) | ORAL | Status: DC | PRN
  Administered 2020-03-12: 10 mg via ORAL
  Filled 2020-03-12: qty 2

## 2020-03-12 MED ORDER — METOCLOPRAMIDE HCL 5 MG/ML IJ SOLN
10.0000 mg | Freq: Four times a day (QID) | INTRAMUSCULAR | Status: DC | PRN
  Administered 2020-03-12 – 2020-03-13 (×2): 10 mg via INTRAVENOUS
  Filled 2020-03-12 (×2): qty 2

## 2020-03-12 NOTE — Progress Notes (Signed)
PROGRESS NOTE    Katherine Oliver   HER:[id number]  DOB: 1946/08/11  PCP: [name], MD    DOA: 03/06/2020 LOS: 5   Brief Narrative   Katherine Oliver is a 74 y.o. 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  She otherwise denied complaints and felt like she was at her baseline.  She denied abdominal pain, fever or chills, nausea or vomiting.  Appetite was preserved.  She was initially tachycardic in the ER with rate about 100.  The emergency room provider contacted the doctors at [facility] who advised that the drain which came out is contiguous with the other drain on patient's left as well as with the stomach so there was little concern for fluid collection or obstruction as the biliary fluid could drain elsewhere.  Patient was getting ready to be discharged from the emergency room back to the skilled rehab however she was noted to not have urinated during the duration of her stay in the emergency room.  She could not spontaneously urinate and a catheter was placed with return of purulent appearing urine.  On urinalysis the urine was described as turbid with trace leukocytes and many bacteria.  Labs showed WBC 8.5 and lactic acid 1.1, sodium of 130, creatinine 1.2 (from baseline of 0.92).   Treated in the ED with Rocephin and admitted to hospitalist service for treatment of UTI and acute urinary retention.      Assessment & Plan   Principal Problem:   UTI (urinary tract infection) Active Problems:   Diabetes mellitus type 2, uncontrolled, with complications (HCC)   HTN (hypertension)   NAFLD (nonalcoholic fatty liver disease)   AF (paroxysmal atrial fibrillation) (HCC)   Chronic pancreatitis (HCC)    Biliary drain displacement   Gastrostomy in place [facility])  UTI (urinary tract infection) - present on admission.  Initial culture grew multiple species.  Patient with history of recurrent UTI's, follows with urology, was told they may put her on maintenance Macrobid but this has not been done yet.  Prior urine cultures grew enterococcus.  Still symptomatic with dysuria after 2 days Rocephin, so changed to meropenem.  Repeat cultures also grew multiple species.  Dysuria improved after change in antibiotic and addition of pyridium and Premarin yesterday.  She may be colonized with bacteria and having dysuria related to atrophic vaginitis(?). --stopped antibiotics 5/8 and monitor (received 3 days treatment) --d/c pyridium and monitor for recurrent dysuria --continuePremarin cream (can be given just topical, not internal) --follow up urology outpatient  Hypomagnesemia - replaced.  Recheck in AM.  Replace as needed, goal Mg > 2.0.  Acute urinary retention - present on admission, resolved.    Intractable nausea - since surgery, is constant when tube feeds running, improves somewhat at slower rate of feeding.  Zofran not very helpful.  Phenergan helps but burns.  Doing much better with Reglan.  She says her husband's meclizine helps her nausea. --will transition to oral regimen in preparation for discharge --will start on PO Reglan scheduled  --12 lead ECG pending to check QTc --meclizine PRN  --d/c Zofran and Phenergan --IV Reglan PRN if nausea refractory to PO meds  Insulin-dependent type 2 diabetes -  Controlled.  Hbg A1c 6.2%.   --Sliding scale Novolog and  CBG's q4h (on continuous tube feeds)  Essential hypertension - chronic, stable.   --Continue home amlodipine, benazepril --HCTZ held  NAFLD - stable.  No acute issues.   Paroxysmal atrial fibrillation - rate fairly controlled, patient has some tachycardia related to nausea and abdominal pain.   Not on anticoagulation, not clear why at  this time. --Continue diltiazem  Chronic pancreatitis - status post VARD and jejunostomy as well as PEG tube placement at [facility] in early April (last month) due to necrotizing pancreatitis.    Biliary drain displacement - was initial reason for ED visit.  ED provider spoke with doctors at [facility] who said that fine to leave this out, since left-sided drain still functional (per ER notes). -Follow-up with [facility] at discharge -Continue Pancrease  Gastrostomy in place - continue tube feeds per dietician orders and PO clear liquids for pleasure.   Patient BMI: Body mass index is 26.99 kg/m.   DVT prophylaxis: Lovenox  Diet:  Diet Orders (From admission, onward)    Start     Ordered   03/07/20 1122  Diet clear liquid Room service appropriate? Yes with Assist; Fluid consistency: Thin  Diet effective now    Comments: No Red jello, likes all others.  Question Answer Comment  Room service appropriate? Yes with Assist   Fluid consistency: Thin      03/07/20 1123            Code Status: Full Code    Subjective 03/12/20    Patient seen at bedside, no family present.  She reports feeling well.  Denies dysuria, fever or chills.  Says nausea is much better with Reglan we started yesterday.  She has a box of her husband's OTC meclizine which she says has also helped her nausea and requests to try.   Disposition Plan & Communication   Status is: Inpatient  Remains inpatient appropriate because: she is awaiting rehab placement, has been accepted and awaiting bed.      Dispo: The patient is from: SNF              Anticipated d/c is to: SNF              Anticipated d/c date is: 5/11              Patient currently medically stable for discharge.   Family Communication: none at bedside, will attempt to call    Consults, Procedures, Significant Events   Consultants:   none  Procedures:   none  Antimicrobials:   Rocephin 5/5 >> 5/7  Meropenem 5/7 >> 5/8  Objective   Vitals:    03/12/20 0000 03/12/20 0611 03/12/20 0756 03/12/20 1527  BP: (!) 124/59 138/75 132/70 139/72  Pulse: (!) 105 96 96 92  Resp: 18  17 18   Temp: 98.2 F (36.8 C)  97.9 F (36.6 C) 98.2 F (36.8 C)  TempSrc: Oral     SpO2: 93%  94% 98%  Weight:      Height:        Intake/Output Summary (Last 24 hours) at 03/12/2020 1822 Last data filed at 03/12/2020 1706 Gross per 24 hour  Intake 803 ml  Output 1900 ml  Net -1097 ml   Filed Weights   03/06/20 1553 03/07/20 0649 03/09/20 0500  Weight: 92.1 kg 64.1 kg 64.8 kg    Physical Exam:  General exam: awake, alert, no acute distress, no acute distress Respiratory system: CTAB, normal respiratory effort. Cardiovascular system: normal S1/S2, RRR, no pedal  edema.   Gastrointestinal system: soft, tenderness stable, left side drain in place attached to Foley bag with brown appearing liquid in bag. Central nervous system: A&O x4. no gross focal neurologic deficits, normal speech Extremities: moves all, no cyanosis, normal tone Psychiatry: normal mood, congruent affect, judgement and insight appear normal  Labs   Data Reviewed: I have personally reviewed following labs and imaging studies  CBC: Recent Labs  Lab 03/06/20 1700 03/09/20 0457 03/11/20 0431  WBC 8.5 4.2 4.6  NEUTROABS 6.4  --   --   HGB 10.9* 9.6* 9.7*  HCT 32.7* 29.9* 29.2*  MCV 84.5 86.9 84.1  PLT 262 146* 703   Basic Metabolic Panel: Recent Labs  Lab 03/08/20 0538 03/09/20 0457 03/10/20 0545 03/11/20 0431 03/12/20 0506  NA 133* 134* 134* 133* 133*  K 4.3 5.0 4.7 4.5 4.7  CL 102 103 103 101 99  CO2 23 23 24 26 26   GLUCOSE 172* 208* 171* 168* 181*  BUN 29* 17 15 14 15   CREATININE 0.86 0.64 0.63 0.54 0.60  CALCIUM 8.9 9.0 8.9 9.0 9.0  MG 1.7 1.5* 1.8 1.5* 2.1  PHOS  --   --   --  3.1  --    GFR: Estimated Creatinine Clearance: 52.4 mL/min (by C-G formula based on SCr of 0.6 mg/dL). Liver Function Tests: Recent Labs  Lab 03/06/20 1700  AST 17  ALT 24   ALKPHOS 235*  BILITOT 0.6  PROT 7.6  ALBUMIN 3.9   Recent Labs  Lab 03/06/20 1700  LIPASE 32   No results for input(s): AMMONIA in the last 168 hours. Coagulation Profile: Recent Labs  Lab 03/06/20 1700  INR 1.0   Cardiac Enzymes: No results for input(s): CKTOTAL, CKMB, CKMBINDEX, TROPONINI in the last 168 hours. BNP (last 3 results) No results for input(s): PROBNP in the last 8760 hours. HbA1C: No results for input(s): HGBA1C in the last 72 hours. CBG: Recent Labs  Lab 03/12/20 0013 03/12/20 0406 03/12/20 0755 03/12/20 1150 03/12/20 1649  GLUCAP 175* 175* 173* 228* 243*   Lipid Profile: No results for input(s): CHOL, HDL, LDLCALC, TRIG, CHOLHDL, LDLDIRECT in the last 72 hours. Thyroid Function Tests: No results for input(s): TSH, T4TOTAL, FREET4, T3FREE, THYROIDAB in the last 72 hours. Anemia Panel: No results for input(s): VITAMINB12, FOLATE, FERRITIN, TIBC, IRON, RETICCTPCT in the last 72 hours. Sepsis Labs: Recent Labs  Lab 03/06/20 2327  LATICACIDVEN 1.1    Recent Results (from the past 240 hour(s))  Urine culture     Status: Abnormal   Collection Time: 03/06/20 11:03 PM   Specimen: Urine, Clean Catch  Result Value Ref Range Status   Specimen Description   Final    URINE, CLEAN CATCH Performed at [facility], [address], [place], [place] [address]    Special Requests   Final    NONE Performed at [facility], [address] [place],  [address]    Culture MULTIPLE SPECIES PRESENT, SUGGEST RECOLLECTION (A)  Final   Report Status 03/07/2020 FINAL  Final  Respiratory Panel by RT PCR (Flu A&B, Covid) - Nasopharyngeal Swab     Status: None   Collection Time: 03/07/20  5:18 AM   Specimen: Nasopharyngeal Swab  Result Value Ref Range Status   SARS Coronavirus 2 by RT PCR NEGATIVE NEGATIVE Final    Comment: (NOTE) SARS-CoV-2 target nucleic acids are NOT DETECTED. The SARS-CoV-2 RNA is generally detectable in upper  respiratoy specimens during the acute phase of  infection. The lowest concentration of SARS-CoV-2 viral copies this assay can detect is 131 copies/mL. A negative result does not preclude SARS-Cov-2 infection and should not be used as the sole basis for treatment or other patient management decisions. A negative result may occur with  improper specimen collection/handling, submission of specimen other than nasopharyngeal swab, presence of viral mutation(s) within the areas targeted by this assay, and inadequate number of viral copies (<131 copies/mL). A negative result must be combined with clinical observations, patient history, and epidemiological information. The expected result is Negative. Fact Sheet for Patients:  PinkCheek.be Fact Sheet for Healthcare Providers:  GravelBags.it This test is not yet ap proved or cleared by the [place] FDA and  has been authorized for detection and/or diagnosis of SARS-CoV-2 by FDA under an Emergency Use Authorization (EUA). This EUA will remain  in effect (meaning this test can be used) for the duration of the COVID-19 declaration under Section 564(b)(1) of the Act, 21 U.S.C. section 360bbb-3(b)(1), unless the authorization is terminated or revoked sooner.    Influenza A by PCR NEGATIVE NEGATIVE Final   Influenza B by PCR NEGATIVE NEGATIVE Final    Comment: (NOTE) The Xpert Xpress SARS-CoV-2/FLU/RSV assay is intended as an aid in  the diagnosis of influenza from Nasopharyngeal swab specimens and  should not be used as a sole basis for treatment. Nasal washings and  aspirates are unacceptable for Xpert Xpress SARS-CoV-2/FLU/RSV  testing. Fact Sheet for Patients: PinkCheek.be Fact Sheet for Healthcare Providers: GravelBags.it This test is not yet approved or cleared by the [place] FDA and  has been authorized for  detection and/or diagnosis of SARS-CoV-2 by  FDA under an Emergency Use Authorization (EUA). This EUA will remain  in effect (meaning this test can be used) for the duration of the  Covid-19 declaration under Section 564(b)(1) of the Act, 21  U.S.C. section 360bbb-3(b)(1), unless the authorization is  terminated or revoked. Performed at [facility], [address], [place], [place] [address]   Urine Culture     Status: Abnormal   Collection Time: 03/09/20  4:15 AM   Specimen: Urine, Clean Catch  Result Value Ref Range Status   Specimen Description   Final    URINE, CLEAN CATCH Performed at [facility], [address] [place], [place] [address]    Special Requests   Final    NONE Performed at [name], [address] [place], [place] [address]    Culture MULTIPLE SPECIES PRESENT, SUGGEST RECOLLECTION (A)  Final   Report Status 03/10/2020 FINAL  Final  Respiratory Panel by RT PCR (Flu A&B, Covid) - Nasopharyngeal Swab     Status: None   Collection Time: 03/10/20  1:37 PM   Specimen: Nasopharyngeal Swab  Result Value Ref Range Status   SARS Coronavirus 2 by RT PCR NEGATIVE NEGATIVE Final    Comment: (NOTE) SARS-CoV-2 target nucleic acids are NOT DETECTED. The SARS-CoV-2 RNA is generally detectable in upper respiratoy specimens during the acute phase of infection. The lowest concentration of SARS-CoV-2 viral copies this assay can detect is 131 copies/mL. A negative result does not preclude SARS-Cov-2 infection and should not be used as the sole basis for treatment or other patient management decisions. A negative result may occur with  improper specimen collection/handling, submission of specimen other than nasopharyngeal swab, presence of viral mutation(s) within the areas targeted by this assay, and inadequate number of viral copies (<131 copies/mL). A negative result must be combined with  clinical observations, patient history, and  epidemiological information. The expected result is Negative. Fact Sheet for Patients:  PinkCheek.be Fact Sheet for Healthcare Providers:  GravelBags.it This test is not yet ap proved or cleared by the [place] FDA and  has been authorized for detection and/or diagnosis of SARS-CoV-2 by FDA under an Emergency Use Authorization (EUA). This EUA will remain  in effect (meaning this test can be used) for the duration of the COVID-19 declaration under Section 564(b)(1) of the Act, 21 U.S.C. section 360bbb-3(b)(1), unless the authorization is terminated or revoked sooner.    Influenza A by PCR NEGATIVE NEGATIVE Final   Influenza B by PCR NEGATIVE NEGATIVE Final    Comment: (NOTE) The Xpert Xpress SARS-CoV-2/FLU/RSV assay is intended as an aid in  the diagnosis of influenza from Nasopharyngeal swab specimens and  should not be used as a sole basis for treatment. Nasal washings and  aspirates are unacceptable for Xpert Xpress SARS-CoV-2/FLU/RSV  testing. Fact Sheet for Patients: PinkCheek.be Fact Sheet for Healthcare Providers: GravelBags.it This test is not yet approved or cleared by the [place] FDA and  has been authorized for detection and/or diagnosis of SARS-CoV-2 by  FDA under an Emergency Use Authorization (EUA). This EUA will remain  in effect (meaning this test can be used) for the duration of the  Covid-19 declaration under Section 564(b)(1) of the Act, 21  U.S.C. section 360bbb-3(b)(1), unless the authorization is  terminated or revoked. Performed at [facility], [address] [place], [place] [address]   SARS Coronavirus 2 by RT PCR (hospital order, performed in [facility] hospital lab) Nasopharyngeal Nasopharyngeal Swab     Status: None   Collection Time: 03/12/20  3:49 PM   Specimen: Nasopharyngeal Swab  Result Value Ref Range Status    SARS Coronavirus 2 NEGATIVE NEGATIVE Final    Comment: (NOTE) SARS-CoV-2 target nucleic acids are NOT DETECTED. The SARS-CoV-2 RNA is generally detectable in upper and lower respiratory specimens during the acute phase of infection. The lowest concentration of SARS-CoV-2 viral copies this assay can detect is 250 copies / mL. A negative result does not preclude SARS-CoV-2 infection and should not be used as the sole basis for treatment or other patient management decisions.  A negative result may occur with improper specimen collection / handling, submission of specimen other than nasopharyngeal swab, presence of viral mutation(s) within the areas targeted by this assay, and inadequate number of viral copies (<250 copies / mL). A negative result must be combined with clinical observations, patient history, and epidemiological information. Fact Sheet for Patients:   StrictlyIdeas.no Fact Sheet for Healthcare Providers: BankingDealers.co.za This test is not yet approved or cleared  by the [place] FDA and has been authorized for detection and/or diagnosis of SARS-CoV-2 by FDA under an Emergency Use Authorization (EUA).  This EUA will remain in effect (meaning this test can be used) for the duration of the COVID-19 declaration under Section 564(b)(1) of the Act, 21 U.S.C. section 360bbb-3(b)(1), unless the authorization is terminated or revoked sooner. Performed at [facility], [address] [place], [place] [address]       Imaging Studies   No results found.   Medications   Scheduled Meds: . amLODipine  2.5 mg Per Tube Q1500  . benazepril  20 mg Per Tube Daily  . conjugated estrogens  1 Applicatorful Vaginal Daily  . diltiazem  30 mg Per Tube Q8H  . enoxaparin (LOVENOX) injection  1 mg/kg Subcutaneous BID  .  fluticasone  1 spray Each Nare Daily  . free water  30 mL Per Tube Q4H  . insulin aspart  0-15 Units  Subcutaneous Q4H  . lipase/protease/amylase)  41,760 Units Per Tube TID  . multivitamin with minerals  1 tablet Per Tube Daily   Continuous Infusions: . feeding supplement (JEVITY 1.5 CAL/FIBER) 1,000 mL (03/12/20 0434)       LOS: 5 days    Time spent: 30 minutes    Ezekiel Slocumb, DO [facility]  03/12/2020, 6:22 PM    If 7PM-7AM, please contact night-coverage. How to contact the [facility] Attending or Consulting provider [place] or covering provider during after hours [place], for this patient?    1. Check the care team in [facility] and look for a) attending/consulting TRH provider listed and b) the [facility] team listed 2. Log into [email or web address] and use [place]'s universal password to access. If you do not have the password, please contact the hospital operator. 3. Locate the [facility] provider you are looking for under [facility] and page to a number that you can be directly reached. 4. If you still have difficulty reaching the provider, please page the [facility] (Director on Call) for the Hospitalists listed on amion for assistance.

## 2020-03-12 NOTE — Care Management Important Message (Signed)
Important Message  Patient Details  Name: Katherine Oliver MRN: [id number] Date of Birth: [birth year]   Medicare Important Message Given:  Yes     Juliann Pulse A Gunther Zawadzki 03/12/2020, 11:12 AM

## 2020-03-12 NOTE — TOC Progression Note (Signed)
Transition of Care [facility]) - Progression Note    Patient Details  Name: Katherine Oliver MRN: [id number] Date of Birth: 1946/05/07  Transition of [facility] CM/SW Contact  Katherine Oliver, Gardiner Rhyme, LCSW Phone Number: 03/12/2020, 1:48 PM  Clinical Narrative:   Bed offers via [facility]. Have contacted [facility] and [facility] at husband's request and both can not offer a bed to pt. Husband was able to visit [facility] and feels they can mett her needs, so has chosen them. No beds today will work on transfer tomorrow. MD to order new COVID test. Talk with husband in am.         Expected Discharge Plan and Services                                                 Social Determinants of Health (SDOH) Interventions    Readmission Risk Interventions No flowsheet data found.

## 2020-03-13 DIAGNOSIS — Z87440 Personal history of urinary (tract) infections: Secondary | ICD-10-CM | POA: Diagnosis not present

## 2020-03-13 DIAGNOSIS — Z931 Gastrostomy status: Secondary | ICD-10-CM | POA: Diagnosis not present

## 2020-03-13 DIAGNOSIS — R0902 Hypoxemia: Secondary | ICD-10-CM | POA: Diagnosis not present

## 2020-03-13 DIAGNOSIS — T85528A Displacement of other gastrointestinal prosthetic devices, implants and grafts, initial encounter: Secondary | ICD-10-CM | POA: Diagnosis present

## 2020-03-13 DIAGNOSIS — N183 Chronic kidney disease, stage 3 unspecified: Secondary | ICD-10-CM | POA: Diagnosis not present

## 2020-03-13 DIAGNOSIS — R339 Retention of urine, unspecified: Secondary | ICD-10-CM | POA: Diagnosis not present

## 2020-03-13 DIAGNOSIS — K8591 Acute pancreatitis with uninfected necrosis, unspecified: Secondary | ICD-10-CM | POA: Diagnosis not present

## 2020-03-13 DIAGNOSIS — K9413 Enterostomy malfunction: Secondary | ICD-10-CM | POA: Diagnosis not present

## 2020-03-13 DIAGNOSIS — Z20822 Contact with and (suspected) exposure to covid-19: Secondary | ICD-10-CM | POA: Diagnosis not present

## 2020-03-13 DIAGNOSIS — K861 Other chronic pancreatitis: Secondary | ICD-10-CM | POA: Diagnosis not present

## 2020-03-13 DIAGNOSIS — I499 Cardiac arrhythmia, unspecified: Secondary | ICD-10-CM | POA: Diagnosis not present

## 2020-03-13 DIAGNOSIS — Z7401 Bed confinement status: Secondary | ICD-10-CM | POA: Diagnosis not present

## 2020-03-13 DIAGNOSIS — M1711 Unilateral primary osteoarthritis, right knee: Secondary | ICD-10-CM | POA: Diagnosis not present

## 2020-03-13 DIAGNOSIS — K760 Fatty (change of) liver, not elsewhere classified: Secondary | ICD-10-CM | POA: Diagnosis not present

## 2020-03-13 DIAGNOSIS — Z03818 Encounter for observation for suspected exposure to other biological agents ruled out: Secondary | ICD-10-CM | POA: Diagnosis not present

## 2020-03-13 DIAGNOSIS — I480 Paroxysmal atrial fibrillation: Secondary | ICD-10-CM | POA: Diagnosis not present

## 2020-03-13 DIAGNOSIS — R110 Nausea: Secondary | ICD-10-CM | POA: Diagnosis not present

## 2020-03-13 DIAGNOSIS — I10 Essential (primary) hypertension: Secondary | ICD-10-CM | POA: Diagnosis not present

## 2020-03-13 DIAGNOSIS — R3915 Urgency of urination: Secondary | ICD-10-CM | POA: Diagnosis not present

## 2020-03-13 DIAGNOSIS — M2550 Pain in unspecified joint: Secondary | ICD-10-CM | POA: Diagnosis not present

## 2020-03-13 DIAGNOSIS — G8929 Other chronic pain: Secondary | ICD-10-CM | POA: Diagnosis not present

## 2020-03-13 DIAGNOSIS — E1165 Type 2 diabetes mellitus with hyperglycemia: Secondary | ICD-10-CM

## 2020-03-13 DIAGNOSIS — Y732 Prosthetic and other implants, materials and accessory gastroenterology and urology devices associated with adverse incidents: Secondary | ICD-10-CM | POA: Diagnosis not present

## 2020-03-13 DIAGNOSIS — I129 Hypertensive chronic kidney disease with stage 1 through stage 4 chronic kidney disease, or unspecified chronic kidney disease: Secondary | ICD-10-CM | POA: Diagnosis not present

## 2020-03-13 DIAGNOSIS — R300 Dysuria: Secondary | ICD-10-CM | POA: Diagnosis not present

## 2020-03-13 DIAGNOSIS — M6281 Muscle weakness (generalized): Secondary | ICD-10-CM | POA: Diagnosis not present

## 2020-03-13 DIAGNOSIS — E118 Type 2 diabetes mellitus with unspecified complications: Secondary | ICD-10-CM

## 2020-03-13 DIAGNOSIS — B373 Candidiasis of vulva and vagina: Secondary | ICD-10-CM | POA: Diagnosis not present

## 2020-03-13 DIAGNOSIS — R5381 Other malaise: Secondary | ICD-10-CM | POA: Diagnosis not present

## 2020-03-13 DIAGNOSIS — K219 Gastro-esophageal reflux disease without esophagitis: Secondary | ICD-10-CM | POA: Diagnosis not present

## 2020-03-13 DIAGNOSIS — E1122 Type 2 diabetes mellitus with diabetic chronic kidney disease: Secondary | ICD-10-CM | POA: Diagnosis not present

## 2020-03-13 DIAGNOSIS — Z938 Other artificial opening status: Secondary | ICD-10-CM | POA: Diagnosis not present

## 2020-03-13 DIAGNOSIS — K8592 Acute pancreatitis with infected necrosis, unspecified: Secondary | ICD-10-CM | POA: Diagnosis not present

## 2020-03-13 DIAGNOSIS — E119 Type 2 diabetes mellitus without complications: Secondary | ICD-10-CM | POA: Diagnosis not present

## 2020-03-13 DIAGNOSIS — R1084 Generalized abdominal pain: Secondary | ICD-10-CM | POA: Diagnosis not present

## 2020-03-13 DIAGNOSIS — Z794 Long term (current) use of insulin: Secondary | ICD-10-CM | POA: Diagnosis not present

## 2020-03-13 DIAGNOSIS — N390 Urinary tract infection, site not specified: Secondary | ICD-10-CM | POA: Diagnosis not present

## 2020-03-13 LAB — BASIC METABOLIC PANEL
Anion gap: 7 (ref 5–15)
BUN: 16 mg/dL (ref 8–23)
CO2: 27 mmol/L (ref 22–32)
Calcium: 9.2 mg/dL (ref 8.9–10.3)
Chloride: 98 mmol/L (ref 98–111)
Creatinine, Ser: 0.49 mg/dL (ref 0.44–1.00)
GFR calc Af Amer: >60 mL/min (ref >60)
GFR calc non Af Amer: >60 mL/min (ref >60)
Glucose, Bld: 194 mg/dL — ABNORMAL HIGH (ref 70–99)
Potassium: 5.1 mmol/L (ref 3.5–5.1)
Sodium: 132 mmol/L — ABNORMAL LOW (ref 135–145)

## 2020-03-13 LAB — GLUCOSE, CAPILLARY
Glucose-Capillary: 193 mg/dL — ABNORMAL HIGH (ref 70–99)
Glucose-Capillary: 220 mg/dL — ABNORMAL HIGH (ref 70–99)
Glucose-Capillary: 225 mg/dL — ABNORMAL HIGH (ref 70–99)
Glucose-Capillary: 262 mg/dL — ABNORMAL HIGH (ref 70–99)

## 2020-03-13 LAB — PHOSPHORUS: Phosphorus: 4 mg/dL (ref 2.5–4.6)

## 2020-03-13 LAB — MAGNESIUM: Magnesium: 1.7 mg/dL (ref 1.7–2.4)

## 2020-03-13 MED ORDER — METOCLOPRAMIDE HCL 10 MG PO TABS: 10.0000 mg | ORAL_TABLET | Freq: Four times a day (QID) | ORAL | 0 refills | Status: DC

## 2020-03-13 MED ORDER — METOCLOPRAMIDE HCL 10 MG PO TABS
10.0000 mg | ORAL_TABLET | Freq: Four times a day (QID) | ORAL | Status: DC
  Administered 2020-03-13: 13:00:00 10 mg via ORAL
  Filled 2020-03-13: qty 2
  Filled 2020-03-13 (×4): qty 1

## 2020-03-13 MED ORDER — MECLIZINE HCL 25 MG PO TABS: 25.0000 mg | ORAL_TABLET | Freq: Three times a day (TID) | ORAL | 0 refills | Status: DC | PRN

## 2020-03-13 MED ORDER — OXYCODONE HCL 5 MG PO TABS: 5.0000 mg | ORAL_TABLET | Freq: Three times a day (TID) | ORAL | 0 refills | Status: AC | PRN

## 2020-03-13 MED ORDER — ESTROGENS, CONJUGATED 0.625 MG/GM VA CREA: 1.0000 | TOPICAL_CREAM | Freq: Every day | VAGINAL | 12 refills | Status: DC

## 2020-03-13 MED ORDER — ENOXAPARIN SODIUM 80 MG/0.8ML ~~LOC~~ SOLN: 1.0000 mg/kg | Freq: Two times a day (BID) | SUBCUTANEOUS | 0 refills | Status: DC

## 2020-03-13 MED ORDER — JEVITY 1.5 CAL/FIBER PO LIQD: 1000.0000 mL | ORAL | Status: DC

## 2020-03-13 MED ORDER — FREE WATER: 30.0000 mL | 0 refills | Status: DC

## 2020-03-13 MED ORDER — INSULIN ASPART 100 UNIT/ML ~~LOC~~ SOLN: 0.0000 [IU] | SUBCUTANEOUS | 11 refills | Status: DC

## 2020-03-13 NOTE — Discharge Summary (Addendum)
Physician Discharge Summary  [name] VOJ:[id number] DOB: 11/05/1945 DOA: 03/06/2020  PCP: [name], MD  Admit date: 03/06/2020 Discharge date: 03/13/2020  Admitted From: SNF Disposition:  SNF [place]  Recommendations for Outpatient Follow-up:  1. Follow up with PCP in 1-2 weeks 2. Please obtain BMP/CBC in one week 3. Please follow up with [place] surgeon as previously scheduled 4. Call [place] surgeon with any issues related to the abdominal drain 5.   Follow up with urology as previously scheduled  Home Health: No  Equipment/Devices: None   Discharge Condition: Stable  CODE STATUS: Full  Diet recommendation: Tube Feeds as below, with clear liquid oral diet  Jevity 1.5 cal/fiber liquid, should be run continuously at 50 mL/hr (goal rate). If nausea/vomiting at 50 mL/hr, reduce by 10 mL/hr until tolerated.  Resume at goal rate when tolerated.    Free water Flushes 30 mL every 4 hours. If sodium > 150 on follow-up labs, can increase free water flushes to every 2 hours.   Discharge Diagnoses: Principal Problem:   UTI (urinary tract infection) Active Problems:   Diabetes mellitus type 2, uncontrolled, with complications (HCC)   HTN (hypertension)   NAFLD (nonalcoholic fatty liver disease)   AF (paroxysmal atrial fibrillation) (HCC)   Chronic pancreatitis ([place])   Biliary drain displacement   Gastrostomy in place [facility])    Summary of HPI and Hospital Course:  Katherine Oliver is a 74 y.o. 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  She otherwise denied complaints and felt like she was at her baseline.  She denied abdominal pain, fever or chills, nausea or vomiting.   Appetite was preserved.  She was initially tachycardic in the ER with rate about 100.  The emergency room provider contacted the doctors at [facility] who advised that the drain which came out is contiguous with the other drain on patient's left as well as with the stomach so there was little concern for fluid collection or obstruction as the biliary fluid could drain elsewhere.  Patient was getting ready to be discharged from the emergency room back to the skilled rehab however she was noted to not have urinated during the duration of her stay in the emergency room.  She could not spontaneously urinate and a catheter was placed with return of purulent appearing urine.  On urinalysis the urine was described as turbid with trace leukocytes and many bacteria.  Labs showed WBC 8.5 and lactic acid 1.1, sodium of 130, creatinine 1.2 (from baseline of 0.92).   Treated in the ED with Rocephin and admitted to hospitalist service for treatment of UTI and acute urinary retention.    UTI (urinary tract infection) - present on admission.  Initial culture grew multiple species.  Patient with history of recurrent UTI's, follows with urology, was told they may put her on maintenance Macrobid but this has not been done yet.  Prior urine cultures grew enterococcus.  remained symptomatic with dysuria after 2 days Rocephin, so changed to meropenem.  Repeat cultures again grew multiple species.  Dysuria improved after change in antibiotic and addition of pyridium and Premarin yesterday.  She may be colonized with bacteria and having dysuria related to atrophic vaginitis.  Stopped antibiotics 5/8 after patient had received 3 days  treatment.  She remains asymptomatic. --continuePremarin cream (can be given just topical, not internal) --follow up urology outpatient  Hypomagnesemia - replaced.  Recheck in AM.  Replace as needed, goal Mg > 2.0.  Acute urinary retention - present on admission, resolved.    Intractable nausea - since  surgery, is constant when tube feeds running, improves somewhat at slower rate of feeding.  Zofran not very helpful.  Phenergan helps but burns.  Doing much better with Reglan.  She says her husband's meclizine helps her nausea. --will transition to oral regimen in preparation for discharge --PO Reglan scheduled  --baseline QTc was 426; recommend periodic EKG's to monitor QTc with Reglan --PO meclizine PRN   Insulin-dependent type 2 diabetes -  Controlled.  Hbg A1c 6.2%.   --Sliding scale Novolog and CBG's q4h (on continuous tube feeds), titrate insulin as needed for glycemic control.  Essential hypertension - chronic, stable.   Continue amlodipine, benazepril.  Stop HCTZ.  NAFLD - stable.  No acute issues.   Paroxysmal atrial fibrillation - rate fairly controlled, patient has some tachycardia related to nausea and abdominal pain.   Not on anticoagulation. --Continue diltiazem  Chronic pancreatitis - status postVARDand jejunostomy as well as PEG tube placementat [facility] in early April (last month) due to necrotizing pancreatitis.    Biliary drain displacement - was initial reason for ED visit.  ED provider spoke with doctors at [facility] who said that fine to leave this out, since left-sided drain still functional (per ER notes). -Follow-up with [facility] at discharge -Continue Pancrease  Gastrostomy in place - continue tube feeds per orders and PO clear liquids for pleasure. Free water flushes 30 cc every 4 hours.  Monitor sodium level and increase to every 2 hours if sodium > 150.  History of DVT/PE - continue full dose Lovenox.  Discharge Instructions   Drain in left side of abdomen needs to be flushed with 10 mL sterile normal saline TWICE DAILY.  Contact patient's surgeon at [facility], Dr. Cristino Martes at [phone], if any issues or questions regarding with the surgical drain.   Keep head of bed elevated 30 degrees to prevent aspiration.  Discharge Instructions    Call MD for:   extreme fatigue   Complete by: As directed    Call MD for:  persistant dizziness or light-headedness   Complete by: As directed    Call MD for:  persistant nausea and vomiting   Complete by: As directed    Call MD for:  severe uncontrolled pain   Complete by: As directed    Call MD for:  temperature >100.4   Complete by: As directed    Diet - low sodium heart healthy   Complete by: As directed    Increase activity slowly   Complete by: As directed      Allergies as of 03/13/2020      Reactions   Azithromycin Itching   Okay if takes benadryl along with it   Nickel    Reaction to cheap earrings   Sulfa Antibiotics    Itching.    Vinegar [acetic Acid]    Nausea.    Adhesive [tape] Rash   Paper tape - blisters      Medication List    STOP taking these medications   diphenhydrAMINE 50 MG/ML injection Commonly known as: BENADRYL   hydrochlorothiazide 12.5 MG tablet Commonly known as: HYDRODIURIL   HYDROmorphone 1 MG/ML injection Commonly known as: DILAUDID   insulin glargine 100 UNIT/ML injection Commonly known  as: LANTUS   insulin regular 100 units/mL injection Commonly known as: NOVOLIN R   labetalol 5 MG/ML injection Commonly known as: NORMODYNE   LOVENOX IJ Replaced by: enoxaparin 80 MG/0.8ML injection   meropenem 1 g in sodium chloride 0.9 % 100 mL   metoprolol tartrate 5 MG/5ML Soln injection Commonly known as: LOPRESSOR   naloxone 0.4 MG/ML injection Commonly known as: NARCAN   nystatin cream Commonly known as: MYCOSTATIN   ondansetron 4 MG tablet Commonly known as: ZOFRAN   ondansetron 4 MG/2ML Soln injection Commonly known as: ZOFRAN   ondansetron 4 MG/5ML solution Commonly known as: ZOFRAN   oxyCODONE 5 MG/5ML solution Commonly known as: ROXICODONE Replaced by: oxyCODONE 5 MG immediate release tablet   pantoprazole 40 MG injection Commonly known as: PROTONIX   promethazine 25 MG/ML injection Commonly known as: PHENERGAN     TAKE  these medications   acetaminophen 160 MG/5ML suspension Commonly known as: TYLENOL Place 30.5 mg into feeding tube every 6 (six) hours as needed.   amLODIPine Benzoate 1 MG/ML Susp 2.5 mLs by Per J Tube route daily in the afternoon.   benazepril 20 MG tablet Commonly known as: LOTENSIN 20 mg by Per J Tube route daily.   conjugated estrogens vaginal cream Commonly known as: PREMARIN Place 1 Applicatorful vaginally daily. Only to be applied topically Start taking on: Mar 14, 2020   DILTIAZEM HCL PO 7.5 mLs by Per J Tube route.   enoxaparin 80 MG/0.8ML injection Commonly known as: LOVENOX Inject 0.65 mLs (65 mg total) into the skin 2 (two) times daily. Replaces: LOVENOX IJ   feeding supplement (JEVITY 1.5 CAL/FIBER) Liqd 1,000 mLs by Per J Tube route continuous. Goal rate 50 cc/hr, reduce by 10 cc/hr as needed if nausea occurs. What changed:   how to take this  additional instructions   fluticasone 50 MCG/ACT nasal spray Commonly known as: FLONASE Place 1 spray into both nostrils daily.   free water Soln Place 30 mLs into feeding tube every 4 (four) hours.   insulin aspart 100 UNIT/ML injection Commonly known as: novoLOG Inject 0-15 Units into the skin every 4 (four) hours.   Loperamide HCl 1 MG/7.5ML Soln 7.5 mLs by Per J Tube route every 6 (six) hours as needed (DIARRHEA).   meclizine 25 MG tablet Commonly known as: ANTIVERT Take 1 tablet (25 mg total) by mouth 3 (three) times daily as needed for nausea.   melatonin 3 MG Tabs tablet Place 3 mg into feeding tube at bedtime.   metoCLOPramide 5 MG/ML injection Commonly known as: REGLAN Inject 2 mLs (10 mg total) into the vein every 6 (six) hours as needed. What changed: Another medication with the same name was added. Make sure you understand how and when to take each.   metoCLOPramide 10 MG tablet Commonly known as: REGLAN Take 1 tablet (10 mg total) by mouth every 6 (six) hours. What changed: You were  already taking a medication with the same name, and this prescription was added. Make sure you understand how and when to take each.   multivitamin tablet 1 tablet by Per J Tube route daily.   omeprazole 2 mg/mL Susp oral suspension Commonly known as: FIRST-Omeprazole Place 10 mg into feeding tube daily.   oxyCODONE 5 MG immediate release tablet Commonly known as: Roxicodone Take 1 tablet (5 mg total) by mouth every 8 (eight) hours as needed for up to 3 days for severe pain. Replaces: oxyCODONE 5 MG/5ML solution   Pancrelipase (Lip-Prot-Amyl)  [address]-60454 units Tabs 2 tablets by Per J Tube route 3 (three) times daily with meals.       Contact information for follow-up providers    Schedule an appointment as soon as possible for a visit  with Zani, Karyl Kinnier., MD.   Specialty: Orthopedic Surgery Contact information: [address] [place] [address] [phone]        Go to  [place].   Specialty: Emergency Medicine Why: If symptoms worsen Contact information: [place] [phone] ar Padroni Mathis [phone]           Contact information for after-discharge care    Destination    [facility] Preferred SNF .   Service: Skilled Nursing Contact information: [address] [address] [phone]                 Allergies  Allergen Reactions  . Azithromycin Itching    Okay if takes benadryl along with it  . Nickel     Reaction to cheap earrings  . Sulfa Antibiotics     Itching.   [name] [Acetic Acid]     Nausea.   . Adhesive [Tape] Rash    Paper tape - blisters    Consultations:  None    Procedures/Studies: None   Subjective: Patient reports feeling well this AM.  Did have vomiting with tube feeds at 5ml/hr last night, better this AM with feeds running at 40.  Denies any dysuria or increase in frequency, fevers or  chills.   Discharge Exam: Vitals:   03/12/20 2327 03/13/20 0821  BP: 137/72 139/64  Pulse: 96 98  Resp: 17 16  Temp: 99.1 F (37.3 C) 98.7 F (37.1 C)  SpO2: 94% 92%   Vitals:   03/12/20 0756 03/12/20 1527 03/12/20 2327 03/13/20 0821  BP: 132/70 139/72 137/72 139/64  Pulse: 96 92 96 98  Resp: 17 18 17 16   Temp: 97.9 F (36.6 C) 98.2 F (36.8 C) 99.1 F (37.3 C) 98.7 F (37.1 C)  TempSrc:   Oral   SpO2: 94% 98% 94% 92%  Weight:      Height:        General: Pt is alert, awake, not in acute distress Cardiovascular: RRR, S1/S2 +, no rubs, no gallops Respiratory: CTA bilaterally, no wheezing, no rhonchi Abdominal: Soft, NT, ND, bowel sounds +, drain site appears healthy, J tube in place Extremities: no edema, no cyanosis    The results of significant diagnostics from this hospitalization (including imaging, microbiology, ancillary and laboratory) are listed below for reference.     Microbiology: Recent Results (from the past 240 hour(s))  Urine culture     Status: Abnormal   Collection Time: 03/06/20 11:03 PM   Specimen: Urine, Clean Catch  Result Value Ref Range Status   Specimen Description   Final    URINE, CLEAN CATCH Performed at [facility], [address] [place], [place] [address]    Special Requests   Final    NONE Performed at [facility], [address] [place], [place] [address]    Culture MULTIPLE SPECIES PRESENT, SUGGEST RECOLLECTION (A)  Final   Report Status 03/07/2020 FINAL  Final  Respiratory Panel by RT PCR (Flu A&B, Covid) - Nasopharyngeal Swab     Status: None   Collection Time: 03/07/20  5:18 AM   Specimen: Nasopharyngeal Swab  Result Value Ref Range Status   SARS Coronavirus 2  by RT PCR NEGATIVE NEGATIVE Final    Comment: (NOTE) SARS-CoV-2 target nucleic acids are NOT DETECTED. The SARS-CoV-2 RNA is generally detectable in upper respiratoy specimens during the acute phase of infection. The lowest concentration  of SARS-CoV-2 viral copies this assay can detect is 131 copies/mL. A negative result does not preclude SARS-Cov-2 infection and should not be used as the sole basis for treatment or other patient management decisions. A negative result may occur with  improper specimen collection/handling, submission of specimen other than nasopharyngeal swab, presence of viral mutation(s) within the areas targeted by this assay, and inadequate number of viral copies (<131 copies/mL). A negative result must be combined with clinical observations, patient history, and epidemiological information. The expected result is Negative. Fact Sheet for Patients:  PinkCheek.be Fact Sheet for Healthcare Providers:  GravelBags.it This test is not yet ap proved or cleared by the [place] FDA and  has been authorized for detection and/or diagnosis of SARS-CoV-2 by FDA under an Emergency Use Authorization (EUA). This EUA will remain  in effect (meaning this test can be used) for the duration of the COVID-19 declaration under Section 564(b)(1) of the Act, 21 U.S.C. section 360bbb-3(b)(1), unless the authorization is terminated or revoked sooner.    Influenza A by PCR NEGATIVE NEGATIVE Final   Influenza B by PCR NEGATIVE NEGATIVE Final    Comment: (NOTE) The Xpert Xpress SARS-CoV-2/FLU/RSV assay is intended as an aid in  the diagnosis of influenza from Nasopharyngeal swab specimens and  should not be used as a sole basis for treatment. Nasal washings and  aspirates are unacceptable for Xpert Xpress SARS-CoV-2/FLU/RSV  testing. Fact Sheet for Patients: PinkCheek.be Fact Sheet for Healthcare Providers: GravelBags.it This test is not yet approved or cleared by the [place] FDA and  has been authorized for detection and/or diagnosis of SARS-CoV-2 by  FDA under an Emergency Use Authorization (EUA).  This EUA will remain  in effect (meaning this test can be used) for the duration of the  Covid-19 declaration under Section 564(b)(1) of the Act, 21  U.S.C. section 360bbb-3(b)(1), unless the authorization is  terminated or revoked. Performed at [facility], [address] [place], [place] [address]   Urine Culture     Status: Abnormal   Collection Time: 03/09/20  4:15 AM   Specimen: Urine, Clean Catch  Result Value Ref Range Status   Specimen Description   Final    URINE, CLEAN CATCH Performed at [facility], [address], [place], [place] [address]    Special Requests   Final    NONE Performed at [facility], [address] [place], [place] [address]    Culture MULTIPLE SPECIES PRESENT, SUGGEST RECOLLECTION (A)  Final   Report Status 03/10/2020 FINAL  Final  Respiratory Panel by RT PCR (Flu A&B, Covid) - Nasopharyngeal Swab     Status: None   Collection Time: 03/10/20  1:37 PM   Specimen: Nasopharyngeal Swab  Result Value Ref Range Status   SARS Coronavirus 2 by RT PCR NEGATIVE NEGATIVE Final    Comment: (NOTE) SARS-CoV-2 target nucleic acids are NOT DETECTED. The SARS-CoV-2 RNA is generally detectable in upper respiratoy specimens during the acute phase of infection. The lowest concentration of SARS-CoV-2 viral copies this assay can detect is 131 copies/mL. A negative result does not preclude SARS-Cov-2 infection and should not be used as the sole basis for treatment or other patient management decisions. A negative result may occur with  improper specimen  collection/handling, submission of specimen other than nasopharyngeal swab, presence of viral mutation(s) within the areas targeted by this assay, and inadequate number of viral copies (<131 copies/mL). A negative result must be combined with clinical observations, patient history, and epidemiological information. The expected result is Negative. Fact Sheet for Patients:   PinkCheek.be Fact Sheet for Healthcare Providers:  GravelBags.it This test is not yet ap proved or cleared by the [place] FDA and  has been authorized for detection and/or diagnosis of SARS-CoV-2 by FDA under an Emergency Use Authorization (EUA). This EUA will remain  in effect (meaning this test can be used) for the duration of the COVID-19 declaration under Section 564(b)(1) of the Act, 21 U.S.C. section 360bbb-3(b)(1), unless the authorization is terminated or revoked sooner.    Influenza A by PCR NEGATIVE NEGATIVE Final   Influenza B by PCR NEGATIVE NEGATIVE Final    Comment: (NOTE) The Xpert Xpress SARS-CoV-2/FLU/RSV assay is intended as an aid in  the diagnosis of influenza from Nasopharyngeal swab specimens and  should not be used as a sole basis for treatment. Nasal washings and  aspirates are unacceptable for Xpert Xpress SARS-CoV-2/FLU/RSV  testing. Fact Sheet for Patients: PinkCheek.be Fact Sheet for Healthcare Providers: GravelBags.it This test is not yet approved or cleared by the [place] FDA and  has been authorized for detection and/or diagnosis of SARS-CoV-2 by  FDA under an Emergency Use Authorization (EUA). This EUA will remain  in effect (meaning this test can be used) for the duration of the  Covid-19 declaration under Section 564(b)(1) of the Act, 21  U.S.C. section 360bbb-3(b)(1), unless the authorization is  terminated or revoked. Performed at [facility], [address] [place], [place] [address]   SARS Coronavirus 2 by RT PCR (hospital order, performed in [facility] hospital lab) Nasopharyngeal Nasopharyngeal Swab     Status: None   Collection Time: 03/12/20  3:49 PM   Specimen: Nasopharyngeal Swab  Result Value Ref Range Status   SARS Coronavirus 2 NEGATIVE NEGATIVE Final    Comment: (NOTE) SARS-CoV-2 target  nucleic acids are NOT DETECTED. The SARS-CoV-2 RNA is generally detectable in upper and lower respiratory specimens during the acute phase of infection. The lowest concentration of SARS-CoV-2 viral copies this assay can detect is 250 copies / mL. A negative result does not preclude SARS-CoV-2 infection and should not be used as the sole basis for treatment or other patient management decisions.  A negative result may occur with improper specimen collection / handling, submission of specimen other than nasopharyngeal swab, presence of viral mutation(s) within the areas targeted by this assay, and inadequate number of viral copies (<250 copies / mL). A negative result must be combined with clinical observations, patient history, and epidemiological information. Fact Sheet for Patients:   StrictlyIdeas.no Fact Sheet for Healthcare Providers: BankingDealers.co.za This test is not yet approved or cleared  by the [place] FDA and has been authorized for detection and/or diagnosis of SARS-CoV-2 by FDA under an Emergency Use Authorization (EUA).  This EUA will remain in effect (meaning this test can be used) for the duration of the COVID-19 declaration under Section 564(b)(1) of the Act, 21 U.S.C. section 360bbb-3(b)(1), unless the authorization is terminated or revoked sooner. Performed at [facility], [address] [place],  [address]      Labs: BNP (last 3 results) Recent Labs    12/15/19 0900  BNP 46.6   Basic Metabolic Panel: Recent Labs  Lab 03/09/20 0457 03/10/20  1517 03/11/20 0431 03/12/20 0506 03/13/20 0344  NA 134* 134* 133* 133* 132*  K 5.0 4.7 4.5 4.7 5.1  CL 103 103 101 99 98  CO2 23 24 26 26 27   GLUCOSE 208* 171* 168* 181* 194*  BUN 17 15 14 15 16   CREATININE 0.64 0.63 0.54 0.60 0.49  CALCIUM 9.0 8.9 9.0 9.0 9.2  MG 1.5* 1.8 1.5* 2.1 1.7  PHOS  --   --  3.1  --  4.0   Liver Function  Tests: Recent Labs  Lab 03/06/20 1700  AST 17  ALT 24  ALKPHOS 235*  BILITOT 0.6  PROT 7.6  ALBUMIN 3.9   Recent Labs  Lab 03/06/20 1700  LIPASE 32   No results for input(s): AMMONIA in the last 168 hours. CBC: Recent Labs  Lab 03/06/20 1700 03/09/20 0457 03/11/20 0431  WBC 8.5 4.2 4.6  NEUTROABS 6.4  --   --   HGB 10.9* 9.6* 9.7*  HCT 32.7* 29.9* 29.2*  MCV 84.5 86.9 84.1  PLT 262 146* 157   Cardiac Enzymes: No results for input(s): CKTOTAL, CKMB, CKMBINDEX, TROPONINI in the last 168 hours. BNP: Invalid input(s): POCBNP CBG: Recent Labs  Lab 03/12/20 2000 03/13/20 0013 03/13/20 0351 03/13/20 0823 03/13/20 1133  GLUCAP 247* 220* 193* 225* 262*   D-Dimer No results for input(s): DDIMER in the last 72 hours. Hgb A1c No results for input(s): HGBA1C in the last 72 hours. Lipid Profile No results for input(s): CHOL, HDL, LDLCALC, TRIG, CHOLHDL, LDLDIRECT in the last 72 hours. Thyroid function studies No results for input(s): TSH, T4TOTAL, T3FREE, THYROIDAB in the last 72 hours.  Invalid input(s): FREET3 Anemia work up No results for input(s): VITAMINB12, FOLATE, FERRITIN, TIBC, IRON, RETICCTPCT in the last 72 hours. Urinalysis    Component Value Date/Time   COLORURINE YELLOW (A) 03/06/2020 2303   APPEARANCEUR TURBID (A) 03/06/2020 2303   APPEARANCEUR Cloudy (A) 12/12/2019 1332   LABSPEC 1.021 03/06/2020 2303   PHURINE 5.0 03/06/2020 2303   GLUCOSEU NEGATIVE 03/06/2020 2303   HGBUR NEGATIVE 03/06/2020 2303   BILIRUBINUR NEGATIVE 03/06/2020 2303   BILIRUBINUR Negative 12/12/2019 1332   KETONESUR NEGATIVE 03/06/2020 2303   PROTEINUR 30 (A) 03/06/2020 2303   UROBILINOGEN 0.2 02/12/2018 0805   NITRITE NEGATIVE 03/06/2020 2303   LEUKOCYTESUR TRACE (A) 03/06/2020 2303   Sepsis Labs Invalid input(s): PROCALCITONIN,  WBC,  LACTICIDVEN Microbiology Recent Results (from the past 240 hour(s))  Urine culture     Status: Abnormal   Collection Time: 03/06/20  11:03 PM   Specimen: Urine, Clean Catch  Result Value Ref Range Status   Specimen Description   Final    URINE, CLEAN CATCH Performed at [facility], [address] [place], [place] [address]    Special Requests   Final    NONE Performed at [facility], [address], [place], [place] [address]    Culture MULTIPLE SPECIES PRESENT, SUGGEST RECOLLECTION (A)  Final   Report Status 03/07/2020 FINAL  Final  Respiratory Panel by RT PCR (Flu A&B, Covid) - Nasopharyngeal Swab     Status: None   Collection Time: 03/07/20  5:18 AM   Specimen: Nasopharyngeal Swab  Result Value Ref Range Status   SARS Coronavirus 2 by RT PCR NEGATIVE NEGATIVE Final    Comment: (NOTE) SARS-CoV-2 target nucleic acids are NOT DETECTED. The SARS-CoV-2 RNA is generally detectable in upper respiratoy specimens during the acute phase of infection. The lowest concentration of SARS-CoV-2 viral copies  this assay can detect is 131 copies/mL. A negative result does not preclude SARS-Cov-2 infection and should not be used as the sole basis for treatment or other patient management decisions. A negative result may occur with  improper specimen collection/handling, submission of specimen other than nasopharyngeal swab, presence of viral mutation(s) within the areas targeted by this assay, and inadequate number of viral copies (<131 copies/mL). A negative result must be combined with clinical observations, patient history, and epidemiological information. The expected result is Negative. Fact Sheet for Patients:  PinkCheek.be Fact Sheet for Healthcare Providers:  GravelBags.it This test is not yet ap proved or cleared by the [place] FDA and  has been authorized for detection and/or diagnosis of SARS-CoV-2 by FDA under an Emergency Use Authorization (EUA). This EUA will remain  in effect (meaning this test can be used) for the duration  of the COVID-19 declaration under Section 564(b)(1) of the Act, 21 U.S.C. section 360bbb-3(b)(1), unless the authorization is terminated or revoked sooner.    Influenza A by PCR NEGATIVE NEGATIVE Final   Influenza B by PCR NEGATIVE NEGATIVE Final    Comment: (NOTE) The Xpert Xpress SARS-CoV-2/FLU/RSV assay is intended as an aid in  the diagnosis of influenza from Nasopharyngeal swab specimens and  should not be used as a sole basis for treatment. Nasal washings and  aspirates are unacceptable for Xpert Xpress SARS-CoV-2/FLU/RSV  testing. Fact Sheet for Patients: PinkCheek.be Fact Sheet for Healthcare Providers: GravelBags.it This test is not yet approved or cleared by the [place] FDA and  has been authorized for detection and/or diagnosis of SARS-CoV-2 by  FDA under an Emergency Use Authorization (EUA). This EUA will remain  in effect (meaning this test can be used) for the duration of the  Covid-19 declaration under Section 564(b)(1) of the Act, 21  U.S.C. section 360bbb-3(b)(1), unless the authorization is  terminated or revoked. Performed at [facility], [address], [place], [place] [address]   Urine Culture     Status: Abnormal   Collection Time: 03/09/20  4:15 AM   Specimen: Urine, Clean Catch  Result Value Ref Range Status   Specimen Description   Final    URINE, CLEAN CATCH Performed at [facility], [address], [place], [place] [address]    Special Requests   Final    NONE Performed at [facility], [address] [place], [place] [address]    Culture MULTIPLE SPECIES PRESENT, SUGGEST RECOLLECTION (A)  Final   Report Status 03/10/2020 FINAL  Final  Respiratory Panel by RT PCR (Flu A&B, Covid) - Nasopharyngeal Swab     Status: None   Collection Time: 03/10/20  1:37 PM   Specimen: Nasopharyngeal Swab  Result Value Ref Range Status   SARS Coronavirus 2 by RT PCR  NEGATIVE NEGATIVE Final    Comment: (NOTE) SARS-CoV-2 target nucleic acids are NOT DETECTED. The SARS-CoV-2 RNA is generally detectable in upper respiratoy specimens during the acute phase of infection. The lowest concentration of SARS-CoV-2 viral copies this assay can detect is 131 copies/mL. A negative result does not preclude SARS-Cov-2 infection and should not be used as the sole basis for treatment or other patient management decisions. A negative result may occur with  improper specimen collection/handling, submission of specimen other than nasopharyngeal swab, presence of viral mutation(s) within the areas targeted by this assay, and inadequate number of viral copies (<131 copies/mL). A negative result must be combined with clinical observations, patient history, and epidemiological information.  The expected result is Negative. Fact Sheet for Patients:  PinkCheek.be Fact Sheet for Healthcare Providers:  GravelBags.it This test is not yet ap proved or cleared by the [place] FDA and  has been authorized for detection and/or diagnosis of SARS-CoV-2 by FDA under an Emergency Use Authorization (EUA). This EUA will remain  in effect (meaning this test can be used) for the duration of the COVID-19 declaration under Section 564(b)(1) of the Act, 21 U.S.C. section 360bbb-3(b)(1), unless the authorization is terminated or revoked sooner.    Influenza A by PCR NEGATIVE NEGATIVE Final   Influenza B by PCR NEGATIVE NEGATIVE Final    Comment: (NOTE) The Xpert Xpress SARS-CoV-2/FLU/RSV assay is intended as an aid in  the diagnosis of influenza from Nasopharyngeal swab specimens and  should not be used as a sole basis for treatment. Nasal washings and  aspirates are unacceptable for Xpert Xpress SARS-CoV-2/FLU/RSV  testing. Fact Sheet for Patients: PinkCheek.be Fact Sheet for Healthcare  Providers: GravelBags.it This test is not yet approved or cleared by the [place] FDA and  has been authorized for detection and/or diagnosis of SARS-CoV-2 by  FDA under an Emergency Use Authorization (EUA). This EUA will remain  in effect (meaning this test can be used) for the duration of the  Covid-19 declaration under Section 564(b)(1) of the Act, 21  U.S.C. section 360bbb-3(b)(1), unless the authorization is  terminated or revoked. Performed at [facility], [address] [place], [place] [address]   SARS Coronavirus 2 by RT PCR (hospital order, performed in [facility] hospital lab) Nasopharyngeal Nasopharyngeal Swab     Status: None   Collection Time: 03/12/20  3:49 PM   Specimen: Nasopharyngeal Swab  Result Value Ref Range Status   SARS Coronavirus 2 NEGATIVE NEGATIVE Final    Comment: (NOTE) SARS-CoV-2 target nucleic acids are NOT DETECTED. The SARS-CoV-2 RNA is generally detectable in upper and lower respiratory specimens during the acute phase of infection. The lowest concentration of SARS-CoV-2 viral copies this assay can detect is 250 copies / mL. A negative result does not preclude SARS-CoV-2 infection and should not be used as the sole basis for treatment or other patient management decisions.  A negative result may occur with improper specimen collection / handling, submission of specimen other than nasopharyngeal swab, presence of viral mutation(s) within the areas targeted by this assay, and inadequate number of viral copies (<250 copies / mL). A negative result must be combined with clinical observations, patient history, and epidemiological information. Fact Sheet for Patients:   StrictlyIdeas.no Fact Sheet for Healthcare Providers: BankingDealers.co.za This test is not yet approved or cleared  by the [place] FDA and has been authorized for detection and/or diagnosis  of SARS-CoV-2 by FDA under an Emergency Use Authorization (EUA).  This EUA will remain in effect (meaning this test can be used) for the duration of the COVID-19 declaration under Section 564(b)(1) of the Act, 21 U.S.C. section 360bbb-3(b)(1), unless the authorization is terminated or revoked sooner. Performed at [facility], [address] [place], [place] [address]      Time coordinating discharge: Over 30 minutes  SIGNED:   Ezekiel Slocumb, DO [facility] 03/13/2020, 1:18 PM   If 7PM-7AM, please contact night-coverage [email or web address]

## 2020-03-13 NOTE — TOC Transition Note (Signed)
Transition of Care [facility]) - CM/SW Discharge Note   Patient Details  Name: Katherine Oliver MRN: [id number] Date of Birth: Jan 20, 1946  [facility]) CM/SW Contact:  Elease Hashimoto, LCSW Phone Number: 03/13/2020, 8:51 AM   Clinical Narrative:   Spoke with pt and husband bed available at [place] test back negative. Awaiting MD paperwork and bedside RN to call report to [phone]. DC packet in chart. No further follow due to DC today.    Final next level of care: Skilled Nursing Facility Barriers to Discharge: Barriers Resolved   Patient Goals and CMS Choice   CMS Medicare.gov Compare Post Acute Care list provided to:: Patient Choice offered to / list presented to : Patient, Spouse  Discharge Placement   Existing PASRR number confirmed : 03/06/20          Patient chooses bed at: [facility] Patient to be transferred to facility by: EMS Name of family member notified: Katherine Oliver Patient and family notified of of transfer: 03/13/20  Discharge Plan and Services                                     Social Determinants of Health (SDOH) Interventions     Readmission Risk Interventions No flowsheet data found.

## 2020-03-15 ENCOUNTER — Other Ambulatory Visit: Payer: Self-pay | Admitting: *Deleted

## 2020-03-15 DIAGNOSIS — R1084 Generalized abdominal pain: Secondary | ICD-10-CM | POA: Diagnosis not present

## 2020-03-15 DIAGNOSIS — K861 Other chronic pancreatitis: Secondary | ICD-10-CM | POA: Diagnosis not present

## 2020-03-15 DIAGNOSIS — R110 Nausea: Secondary | ICD-10-CM | POA: Diagnosis not present

## 2020-03-15 DIAGNOSIS — Z931 Gastrostomy status: Secondary | ICD-10-CM | POA: Diagnosis not present

## 2020-03-15 DIAGNOSIS — R5381 Other malaise: Secondary | ICD-10-CM | POA: Diagnosis not present

## 2020-03-15 NOTE — Patient Outreach (Signed)
Member screened for potential [facility] Care Management needs as a benefit of [place] [employer].  Member is receiving skilled therapy at [facility].  Will continue to follow for transition plans and potential [facility] needs.    Marthenia Rolling, MSN-Ed, RN,BSN [place] Acute Care Coordinator [phone] [facility]) [phone]  (Toll free office)

## 2020-03-16 DIAGNOSIS — Z87440 Personal history of urinary (tract) infections: Secondary | ICD-10-CM | POA: Diagnosis not present

## 2020-03-16 DIAGNOSIS — R3915 Urgency of urination: Secondary | ICD-10-CM | POA: Diagnosis not present

## 2020-03-16 DIAGNOSIS — R300 Dysuria: Secondary | ICD-10-CM | POA: Diagnosis not present

## 2020-03-16 DIAGNOSIS — R339 Retention of urine, unspecified: Secondary | ICD-10-CM | POA: Diagnosis not present

## 2020-03-16 DIAGNOSIS — R1084 Generalized abdominal pain: Secondary | ICD-10-CM | POA: Diagnosis not present

## 2020-03-16 DIAGNOSIS — G8929 Other chronic pain: Secondary | ICD-10-CM | POA: Diagnosis not present

## 2020-03-20 DIAGNOSIS — K8591 Acute pancreatitis with uninfected necrosis, unspecified: Secondary | ICD-10-CM | POA: Diagnosis not present

## 2020-03-22 ENCOUNTER — Other Ambulatory Visit: Payer: Self-pay | Admitting: *Deleted

## 2020-03-22 DIAGNOSIS — K861 Other chronic pancreatitis: Secondary | ICD-10-CM | POA: Diagnosis not present

## 2020-03-22 DIAGNOSIS — E119 Type 2 diabetes mellitus without complications: Secondary | ICD-10-CM | POA: Diagnosis not present

## 2020-03-22 DIAGNOSIS — I480 Paroxysmal atrial fibrillation: Secondary | ICD-10-CM | POA: Diagnosis not present

## 2020-03-22 DIAGNOSIS — I10 Essential (primary) hypertension: Secondary | ICD-10-CM | POA: Diagnosis not present

## 2020-03-22 NOTE — Patient Outreach (Signed)
Late entry for 03/21/20  Screened for potential [facility] Care Management needs as a benefit of  [employer].  Mrs. Badolato  is receiving skilled therapy at [facility].   Writer attended telephonic interdisciplinary team meeting to assess for disposition needs and transition plan for resident.   Member is being followed by [facility]. She has necrotizing pancreatitis.   Will continue to follow for transition plans and for potential [facility] needs.    Marthenia Rolling, MSN-Ed, RN,BSN [place] Acute Care Coordinator [phone] [facility]) [phone]  (Toll free office)

## 2020-03-29 DIAGNOSIS — E1165 Type 2 diabetes mellitus with hyperglycemia: Secondary | ICD-10-CM | POA: Diagnosis not present

## 2020-03-29 DIAGNOSIS — B373 Candidiasis of vulva and vagina: Secondary | ICD-10-CM | POA: Diagnosis not present

## 2020-03-29 DIAGNOSIS — N390 Urinary tract infection, site not specified: Secondary | ICD-10-CM | POA: Diagnosis not present

## 2020-03-29 DIAGNOSIS — K861 Other chronic pancreatitis: Secondary | ICD-10-CM | POA: Diagnosis not present

## 2020-03-30 DIAGNOSIS — N390 Urinary tract infection, site not specified: Secondary | ICD-10-CM | POA: Diagnosis not present

## 2020-03-30 DIAGNOSIS — B373 Candidiasis of vulva and vagina: Secondary | ICD-10-CM | POA: Diagnosis not present

## 2020-03-30 DIAGNOSIS — E1165 Type 2 diabetes mellitus with hyperglycemia: Secondary | ICD-10-CM | POA: Diagnosis not present

## 2020-04-01 ENCOUNTER — Emergency Department (HOSPITAL_COMMUNITY): Payer: Medicare Other

## 2020-04-01 ENCOUNTER — Encounter (HOSPITAL_COMMUNITY): Payer: Self-pay | Admitting: Emergency Medicine

## 2020-04-01 ENCOUNTER — Emergency Department (HOSPITAL_COMMUNITY)
Admission: EM | Admit: 2020-04-01 | Discharge: 2020-04-01 | Disposition: A | Discharge status: Home / Self Care | Payer: Medicare Other | Attending: Emergency Medicine | Admitting: Emergency Medicine

## 2020-04-01 DIAGNOSIS — Z794 Long term (current) use of insulin: Secondary | ICD-10-CM | POA: Insufficient documentation

## 2020-04-01 DIAGNOSIS — E1122 Type 2 diabetes mellitus with diabetic chronic kidney disease: Secondary | ICD-10-CM | POA: Insufficient documentation

## 2020-04-01 DIAGNOSIS — T85528A Displacement of other gastrointestinal prosthetic devices, implants and grafts, initial encounter: Secondary | ICD-10-CM

## 2020-04-01 DIAGNOSIS — Z03818 Encounter for observation for suspected exposure to other biological agents ruled out: Secondary | ICD-10-CM | POA: Diagnosis not present

## 2020-04-01 DIAGNOSIS — I129 Hypertensive chronic kidney disease with stage 1 through stage 4 chronic kidney disease, or unspecified chronic kidney disease: Secondary | ICD-10-CM | POA: Insufficient documentation

## 2020-04-01 DIAGNOSIS — Z0000 Encounter for general adult medical examination without abnormal findings: Secondary | ICD-10-CM

## 2020-04-01 DIAGNOSIS — K9413 Enterostomy malfunction: Secondary | ICD-10-CM | POA: Diagnosis not present

## 2020-04-01 DIAGNOSIS — N183 Chronic kidney disease, stage 3 unspecified: Secondary | ICD-10-CM | POA: Insufficient documentation

## 2020-04-01 DIAGNOSIS — K8592 Acute pancreatitis with infected necrosis, unspecified: Secondary | ICD-10-CM | POA: Insufficient documentation

## 2020-04-01 DIAGNOSIS — Z20822 Contact with and (suspected) exposure to covid-19: Secondary | ICD-10-CM | POA: Insufficient documentation

## 2020-04-01 DIAGNOSIS — Y732 Prosthetic and other implants, materials and accessory gastroenterology and urology devices associated with adverse incidents: Secondary | ICD-10-CM | POA: Insufficient documentation

## 2020-04-01 HISTORY — PX: IR REPLC DUODEN/JEJUNO TUBE PERCUT W/FLUORO: IMG2334

## 2020-04-01 LAB — SARS CORONAVIRUS 2 BY RT PCR (HOSPITAL ORDER, PERFORMED IN ~~LOC~~ HOSPITAL LAB): SARS Coronavirus 2: NEGATIVE

## 2020-04-01 LAB — CBG MONITORING, ED: Glucose-Capillary: 197 mg/dL — ABNORMAL HIGH (ref 70–99)

## 2020-04-01 MED ORDER — OXYCODONE-ACETAMINOPHEN 5-325 MG PO TABS
1.0000 | ORAL_TABLET | Freq: Once | ORAL | Status: AC
  Administered 2020-04-01: 1 via ORAL
  Filled 2020-04-01: qty 1

## 2020-04-01 MED ORDER — LIDOCAINE VISCOUS HCL 2 % MT SOLN
OROMUCOSAL | Status: AC
  Filled 2020-04-01: qty 15

## 2020-04-01 MED ORDER — LIDOCAINE VISCOUS HCL 2 % MT SOLN
OROMUCOSAL | Status: AC | PRN
  Administered 2020-04-01: 15 mL via OROMUCOSAL

## 2020-04-01 MED ORDER — IOHEXOL 300 MG/ML  SOLN
50.0000 mL | Freq: Once | INTRAMUSCULAR | Status: AC | PRN
  Administered 2020-04-01: 20 mL

## 2020-04-01 NOTE — ED Notes (Signed)
Patient requesting pain medicine. Dr Wilson Singer aware.

## 2020-04-01 NOTE — ED Provider Notes (Signed)
[facility] EMERGENCY DEPARTMENT Provider Note   CSN: [id number] Arrival date & time: 04/01/20  0109     History Chief Complaint  Patient presents with  . feeding tube issue    Katherine Oliver is a 74 y.o. female.  HPI  83yF with displaced feeding tube.  She estimates it came out around 0500 today. Recent history of necrotizing pancreatitis requiring numerous procedures including laparoscopic placement of jejunostomy tube on 02/10/20 at [facility]. The best I can tell from recent notes and the patient she is getting tube feeds and meds via the tube and on a clear liquid oral diet. She has no other acute complaints.   Past Medical History:  Diagnosis Date  . Allergic rhinitis   . Arthritis   . 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 nephropathy ([place])    DSME at [facility] 01/2016   . Frequent epistaxis 05/16/2019   S/p cauterization with resolution 2020  . GERD (gastroesophageal reflux disease)   . Hepatic steatosis    by abd [place] 05/2012, mild transaminitis - normal iron sat and viral hep panel (2011), stable [place] 2017  . History of chicken pox   . History of measles   . History of recurrent UTIs    on chronic keflex  . HLD (hyperlipidemia)   . HTN (hypertension)   . 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 Kidney cyst, acquired 01/2016   L kidney by [place]  . Kidney stone 01/2016   L kidney by [place]  . Lung nodules 11/2013   overall stable on f/u CT 01/2016  . Osteopenia 06/2013   mild, forearm T -1.1, hip and spine WNL  . Pancreatitis   . Polycythemia    mild, stable (2013)  . Primary localized osteoarthritis of right knee 01/05/2019  . Rosacea    metrogel    Patient Active Problem List   Diagnosis Date Noted  . UTI (urinary tract infection) 03/07/2020  . Biliary drain displacement 03/07/2020  .  Gastrostomy in place [facility]) 03/07/2020  . Chronic pancreatitis ([place]) 01/12/2020  . Acute pancreatitis 01/07/2020  . Ovarian mass, right 01/06/2020  . Acquired renal cyst of left kidney 01/06/2020  . Gastric stress ulcer 01/06/2020  . Acute necrotizing pancreatitis   . AF (paroxysmal atrial fibrillation) ([place])   . Anemia   . AKI (acute kidney injury) ([place]) 12/15/2019  . Choledocholithiasis   . Acute gallstone pancreatitis 12/13/2019  . Primary localized osteoarthritis of right knee 01/05/2019  . 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 Hx of adenomatous polyp of colon 12/15/2017  . Vulvar dermatitis 12/07/2017  . Stress due to illness of family member 08/18/2017  . Left sided abdominal pain 02/05/2017  . Right hip pain 08/07/2016  . CKD stage 3 due to type 2 diabetes mellitus ([place]) 04/04/2016  . Trigger ring finger of right hand 04/04/2016  . Pulmonary nodules 01/03/2016  . Advanced care planning/counseling discussion 07/05/2015  . Obesity, Class I, BMI 30-34.9 07/05/2015  . 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 Osteopenia 06/03/2013  . Medicare annual wellness visit, subsequent 05/31/2012  . Rotator cuff tendonitis, right 03/01/2012  . Polycythemia 12/02/2011  . Diabetes mellitus type 2, uncontrolled, with complications ([place])   . HTN (hypertension)   . Dyslipidemia associated with type 2 diabetes mellitus ([place])   .  History of recurrent UTIs   . Allergic rhinitis   . GERD (gastroesophageal reflux disease)   . Rosacea   . NAFLD (nonalcoholic fatty liver disease) 06/03/2010    Past Surgical History:  Procedure Laterality Date  . APPENDECTOMY  1987  . BILIARY STENT PLACEMENT  12/14/2019   Procedure: BILIARY STENT PLACEMENT;  Surgeon: [name], MD;  Location: [facility];  Service: Endoscopy;;  . BREAST BIOPSY Right 1963   benign  . BREAST BIOPSY Right 08/2014   benign- core  . cardiolite stress test  04/2004   normal  . CESAREAN  SECTION  [id number];[id number]   x2  . CHOLECYSTECTOMY  2003  . COLONOSCOPY  09/26/2008   adenomatous polyp, rpt 3-5 yrs  . COLONOSCOPY  08/2012   adenomatous polyps, diverticulosis, rec rpt 5 yrs [name])  . COLONOSCOPY WITH PROPOFOL N/A 02/05/2018   4TA, SSA, diverticulosis, rpt 3 yrs [name], [name], MD)  . dexa  2003   normal  . dexa  06/2013   [facility] - Tscore -1.1 forearm, normal spine and femur  . ERCP N/A 12/14/2019   Procedure: ENDOSCOPIC RETROGRADE CHOLANGIOPANCREATOGRAPHY (ERCP);  Surgeon: [name], MD;  Location: [facility];  Service: Endoscopy;  Laterality: N/A;  . ERCP  01/2020   nonbleeding gastric ulcer - [facility] (Dr Mont Dutton)  . ESOPHAGOGASTRODUODENOSCOPY N/A 12/19/2019   Procedure: ESOPHAGOGASTRODUODENOSCOPY (EGD);  Surgeon: [name], MD;  Location: [facility] ENDOSCOPY;  Service: Endoscopy;  Laterality: N/A;  . ESOPHAGOGASTRODUODENOSCOPY  01/2020   pre existing AXIOS cystogastrostomy stent s/p necrosectomy - [facility] hospitalization (Dr Mont Dutton)  . IR FLUORO GUIDE CV LINE RIGHT  12/27/2019  . IR REMOVAL TUN CV CATH W/O FL  01/03/2020  . SPHINCTEROTOMY  12/14/2019   Procedure: SPHINCTEROTOMY;  Surgeon: [name], MD;  Location: [facility];  Service: Endoscopy;;  . 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 TRANSTHORACIC ECHOCARDIOGRAM  04/2019   EF 55-60%, modLVH, impaired relaxation   . TRIGGER FINGER RELEASE  2007;2010;2011   bilateral  . TRIGGER FINGER RELEASE  02/2017  . VAGINAL HYSTERECTOMY  1984   for menorrhagia, ovaries in place     OB History   No obstetric history on file.     Family History  Problem Relation Age of Onset  . Stroke Mother        several  . Hyperlipidemia Mother   . Hypertension Mother   . Cancer Father        colon  . Hypertension Father   . Hyperlipidemia Father   . Coronary artery disease  Father 76       MIx1, CABG  . Cancer Paternal Aunt        abdominal  . Coronary artery disease Maternal Grandmother   . Diabetes Maternal Grandfather   . Coronary artery disease Maternal Grandfather   . Breast cancer Neg Hx     Social History   Tobacco Use  . Smoking status: Never Smoker  . Smokeless tobacco: Never Used  Substance Use Topics  . Alcohol use: Not Currently    Alcohol/week: 1.0 - 2.0 standard drinks    Types: 1 - 2 Glasses of wine per week    Comment: Occasional/1 mixed drink per month  . Drug use: No    Home Medications Prior to Admission medications   Medication Sig Start Date End Date Taking? Authorizing Provider  acetaminophen (TYLENOL) 160  MG/5ML suspension Place 30.5 mg into feeding tube every 6 (six) hours as needed.    [provider]  amLODIPine Benzoate 1 MG/ML SUSP 2.5 mLs by Per J Tube route daily in the afternoon.    [provider]  benazepril (LOTENSIN) 20 MG tablet 20 mg by Per J Tube route daily.    [provider]  conjugated estrogens (PREMARIN) vaginal cream Place 1 Applicatorful vaginally daily. Only to be applied topically 03/14/20   Nicole Kindred A, DO  DILTIAZEM HCL PO 7.5 mLs by Per J Tube route.    [provider]  enoxaparin (LOVENOX) 80 MG/0.8ML injection Inject 0.65 mLs (65 mg total) into the skin 2 (two) times daily. 03/13/20   Ezekiel Slocumb, DO  fluticasone (FLONASE) 50 MCG/ACT nasal spray Place 1 spray into both nostrils daily.    [provider]  insulin aspart (NOVOLOG) 100 UNIT/ML injection Inject 0-15 Units into the skin every 4 (four) hours. 03/13/20   Ezekiel Slocumb, DO  meclizine (ANTIVERT) 25 MG tablet Take 1 tablet (25 mg total) by mouth 3 (three) times daily as needed for nausea. 03/13/20   Ezekiel Slocumb, DO  metoCLOPramide (REGLAN) 10 MG tablet Take 1 tablet (10 mg total) by mouth every 6 (six) hours. 03/13/20   Ezekiel Slocumb, DO  metoCLOPramide (REGLAN) 5 MG/ML  injection Inject 2 mLs (10 mg total) into the vein every 6 (six) hours as needed. Patient not taking: Reported on 03/06/2020 01/12/20   Donne Hazel, MD  Multiple Vitamin (MULTIVITAMIN) tablet 1 tablet by Per J Tube route daily.    [provider]  Nutritional Supplements (FEEDING SUPPLEMENT, JEVITY 1.5 CAL/FIBER,) LIQD 1,000 mLs by Per J Tube route continuous. Goal rate 50 cc/hr, reduce by 10 cc/hr as needed if nausea occurs. 03/13/20   Ezekiel Slocumb, DO  omeprazole (FIRST-OMEPRAZOLE) 2 mg/mL SUSP oral suspension Place 10 mg into feeding tube daily.    [provider]  Pancrelipase, Lip-Prot-Amyl, [phone] units TABS 2 tablets by Per J Tube route 3 (three) times daily with meals.    [provider]  Water For Irrigation, Sterile (FREE WATER) SOLN Place 30 mLs into feeding tube every 4 (four) hours. 03/13/20   Ezekiel Slocumb, DO    Allergies    Azithromycin, Nickel, Sulfa antibiotics, Vinegar [acetic acid], and Adhesive [tape]  Review of Systems   Review of Systems All systems reviewed and negative, other than as noted in HPI.  Physical Exam Updated Vital Signs BP (!) 149/86 (BP Location: Right Arm)   Pulse (!) 111   Temp 98.8 F (37.1 C) (Oral)   Resp 16   Ht 5' (1.524 m)   Wt 63.5 kg   SpO2 95%   BMI 27.34 kg/m   Physical Exam Vitals and nursing note reviewed.  Constitutional:      General: She is not in acute distress.    Appearance: She is well-developed.  HENT:     Head: Normocephalic and atraumatic.  Eyes:     General:        Right eye: No discharge.        Left eye: No discharge.     Conjunctiva/sclera: Conjunctivae normal.  Cardiovascular:     Rate and Rhythm: Normal rate and regular rhythm.     Heart sounds: Normal heart sounds. No murmur. No friction rub. No gallop.   Pulmonary:     Effort: Pulmonary effort is normal. No respiratory distress.  Breath sounds: Normal breath sounds.  Abdominal:     General: There is  distension.     Palpations: Abdomen is soft.     Tenderness: There is no abdominal tenderness.     Comments: J tube ostomy w/o drainage or surrounding skin changes.   Musculoskeletal:        General: No tenderness.     Cervical back: Neck supple.  Skin:    General: Skin is warm and dry.  Neurological:     Mental Status: She is alert.  Psychiatric:        Behavior: Behavior normal.        Thought Content: Thought content normal.     ED Results / Procedures / Treatments   Labs (all labs ordered are listed, but only abnormal results are displayed) Labs Reviewed - No data to display  EKG None  Radiology No results found.  Procedures Procedures (including critical care time)  Medications Ordered in ED Medications - No data to display  ED Course  I have reviewed the triage vital signs and the nursing notes.  Pertinent labs & imaging results that were available during my care of the patient were reviewed by me and considered in my medical decision making (see chart for details).    MDM Rules/Calculators/A&P                      73yF with dislodged J tube. Place laparoscopically on 02/10/20. Will consult IR.    Discussed with Dr Kathlene Cote, [place]. The IR schedule today precludes quick placement. I will try placing a foley today with plan for it to be changed out tomorrow.   I tried placing catheter w/o success. 24F/57F don't have enough rigidity for me to advance beyond the tip and 5F too large a diameter.   New tube was ultimately placed by interventional radiology.  Appreciate their assistance.  Follow-up as previously recommended by her other providers.  Final Clinical Impression(s) / ED Diagnoses Final diagnoses:  Jejunostomy tube fell out    Rx / DC Orders ED Discharge Orders    None       Virgel Manifold, MD 04/03/20 [phone]

## 2020-04-01 NOTE — ED Notes (Signed)
Pt transported to IR 

## 2020-04-01 NOTE — ED Notes (Signed)
Patient verbalizes understanding of discharge instructions. Opportunity for questioning and answers were provided. Armband removed by staff, pt discharged from ED.  

## 2020-04-01 NOTE — ED Triage Notes (Signed)
Pt arrives from [facility] via [facility], ems reports that feeding tube came out sometime over night while patient was sleeping. Hx of the same earlier this month. Pt in nad.

## 2020-04-01 NOTE — ED Notes (Signed)
[facility] called  

## 2020-04-01 NOTE — Discharge Instructions (Addendum)
Follow-up with your physicians at [facility] as recommended. Your tube is fine for use.

## 2020-04-01 NOTE — Procedures (Signed)
Interventional Radiology Procedure Note  Procedure: Jejunostomy tube replacement  Complications: None  Estimated Blood Loss: None  Findings: 14 Fr balloon retention jejunostomy successfully placed over guidewire under fluoro with tip well into jejunum. OK to use new tube.  Venetia Night. Kathlene Cote, M.D Pager:  [phone]

## 2020-04-03 ENCOUNTER — Other Ambulatory Visit: Payer: Self-pay | Admitting: *Deleted

## 2020-04-03 DIAGNOSIS — J90 Pleural effusion, not elsewhere classified: Secondary | ICD-10-CM | POA: Diagnosis not present

## 2020-04-03 DIAGNOSIS — R7309 Other abnormal glucose: Secondary | ICD-10-CM | POA: Diagnosis not present

## 2020-04-03 DIAGNOSIS — K8591 Acute pancreatitis with uninfected necrosis, unspecified: Secondary | ICD-10-CM | POA: Diagnosis not present

## 2020-04-03 DIAGNOSIS — N289 Disorder of kidney and ureter, unspecified: Secondary | ICD-10-CM | POA: Diagnosis not present

## 2020-04-03 NOTE — Patient Outreach (Signed)
[facility] Post- Acute Care Coordinator follow up  Member is receiving skilled therapy at [facility].  Telephone call made to Danise Edge (husband/DPR) to discuss transition plans and [facility] services. No answer. HIPAA compliant voicemail message left requesting return call.   Will continue to follow for transition plans while member resides in SNF.   Will attempt outreach again.   Marthenia Rolling, MSN-Ed, RN,BSN [place] Acute Care Coordinator [phone] [facility]) [phone]  (Toll free office)

## 2020-04-09 DIAGNOSIS — E1165 Type 2 diabetes mellitus with hyperglycemia: Secondary | ICD-10-CM | POA: Diagnosis not present

## 2020-04-09 DIAGNOSIS — R112 Nausea with vomiting, unspecified: Secondary | ICD-10-CM | POA: Diagnosis not present

## 2020-04-09 DIAGNOSIS — R197 Diarrhea, unspecified: Secondary | ICD-10-CM | POA: Diagnosis not present

## 2020-04-09 DIAGNOSIS — K861 Other chronic pancreatitis: Secondary | ICD-10-CM | POA: Diagnosis not present

## 2020-04-09 DIAGNOSIS — B373 Candidiasis of vulva and vagina: Secondary | ICD-10-CM | POA: Diagnosis not present

## 2020-04-09 DIAGNOSIS — Z931 Gastrostomy status: Secondary | ICD-10-CM | POA: Diagnosis not present

## 2020-04-10 DIAGNOSIS — K861 Other chronic pancreatitis: Secondary | ICD-10-CM | POA: Diagnosis not present

## 2020-04-10 DIAGNOSIS — Z931 Gastrostomy status: Secondary | ICD-10-CM | POA: Diagnosis not present

## 2020-04-10 DIAGNOSIS — R112 Nausea with vomiting, unspecified: Secondary | ICD-10-CM | POA: Diagnosis not present

## 2020-04-10 DIAGNOSIS — B373 Candidiasis of vulva and vagina: Secondary | ICD-10-CM | POA: Diagnosis not present

## 2020-04-10 DIAGNOSIS — R197 Diarrhea, unspecified: Secondary | ICD-10-CM | POA: Diagnosis not present

## 2020-04-17 ENCOUNTER — Other Ambulatory Visit: Payer: Self-pay | Admitting: Family Medicine

## 2020-04-17 DIAGNOSIS — E119 Type 2 diabetes mellitus without complications: Secondary | ICD-10-CM | POA: Diagnosis not present

## 2020-04-17 DIAGNOSIS — Z794 Long term (current) use of insulin: Secondary | ICD-10-CM | POA: Diagnosis not present

## 2020-04-19 DIAGNOSIS — E1165 Type 2 diabetes mellitus with hyperglycemia: Secondary | ICD-10-CM | POA: Diagnosis not present

## 2020-04-19 DIAGNOSIS — K760 Fatty (change of) liver, not elsewhere classified: Secondary | ICD-10-CM | POA: Diagnosis not present

## 2020-04-19 DIAGNOSIS — R110 Nausea: Secondary | ICD-10-CM | POA: Diagnosis not present

## 2020-04-19 DIAGNOSIS — I480 Paroxysmal atrial fibrillation: Secondary | ICD-10-CM | POA: Diagnosis not present

## 2020-04-19 DIAGNOSIS — K219 Gastro-esophageal reflux disease without esophagitis: Secondary | ICD-10-CM | POA: Diagnosis not present

## 2020-04-19 DIAGNOSIS — M6281 Muscle weakness (generalized): Secondary | ICD-10-CM | POA: Diagnosis not present

## 2020-04-19 DIAGNOSIS — M1711 Unilateral primary osteoarthritis, right knee: Secondary | ICD-10-CM | POA: Diagnosis not present

## 2020-04-19 DIAGNOSIS — Z931 Gastrostomy status: Secondary | ICD-10-CM | POA: Diagnosis not present

## 2020-04-19 DIAGNOSIS — K861 Other chronic pancreatitis: Secondary | ICD-10-CM | POA: Diagnosis not present

## 2020-04-19 DIAGNOSIS — I10 Essential (primary) hypertension: Secondary | ICD-10-CM | POA: Diagnosis not present

## 2020-04-20 ENCOUNTER — Telehealth: Payer: Self-pay

## 2020-04-20 ENCOUNTER — Telehealth: Payer: Self-pay | Admitting: Family Medicine

## 2020-04-20 DIAGNOSIS — D62 Acute posthemorrhagic anemia: Secondary | ICD-10-CM | POA: Diagnosis not present

## 2020-04-20 DIAGNOSIS — Z96651 Presence of right artificial knee joint: Secondary | ICD-10-CM | POA: Diagnosis not present

## 2020-04-20 DIAGNOSIS — N1831 Chronic kidney disease, stage 3a: Secondary | ICD-10-CM | POA: Diagnosis not present

## 2020-04-20 DIAGNOSIS — K280 Acute gastrojejunal ulcer with hemorrhage: Secondary | ICD-10-CM | POA: Diagnosis not present

## 2020-04-20 DIAGNOSIS — D631 Anemia in chronic kidney disease: Secondary | ICD-10-CM | POA: Diagnosis not present

## 2020-04-20 DIAGNOSIS — Z7984 Long term (current) use of oral hypoglycemic drugs: Secondary | ICD-10-CM | POA: Diagnosis not present

## 2020-04-20 DIAGNOSIS — E1136 Type 2 diabetes mellitus with diabetic cataract: Secondary | ICD-10-CM | POA: Diagnosis not present

## 2020-04-20 DIAGNOSIS — K7581 Nonalcoholic steatohepatitis (NASH): Secondary | ICD-10-CM | POA: Diagnosis not present

## 2020-04-20 DIAGNOSIS — I129 Hypertensive chronic kidney disease with stage 1 through stage 4 chronic kidney disease, or unspecified chronic kidney disease: Secondary | ICD-10-CM | POA: Diagnosis not present

## 2020-04-20 DIAGNOSIS — E1122 Type 2 diabetes mellitus with diabetic chronic kidney disease: Secondary | ICD-10-CM | POA: Diagnosis not present

## 2020-04-20 DIAGNOSIS — K8050 Calculus of bile duct without cholangitis or cholecystitis without obstruction: Secondary | ICD-10-CM | POA: Diagnosis not present

## 2020-04-20 DIAGNOSIS — K8591 Acute pancreatitis with uninfected necrosis, unspecified: Secondary | ICD-10-CM | POA: Diagnosis not present

## 2020-04-20 DIAGNOSIS — M6281 Muscle weakness (generalized): Secondary | ICD-10-CM | POA: Diagnosis not present

## 2020-04-20 DIAGNOSIS — I4891 Unspecified atrial fibrillation: Secondary | ICD-10-CM | POA: Diagnosis not present

## 2020-04-20 DIAGNOSIS — Z87440 Personal history of urinary (tract) infections: Secondary | ICD-10-CM | POA: Diagnosis not present

## 2020-04-20 DIAGNOSIS — Z931 Gastrostomy status: Secondary | ICD-10-CM | POA: Diagnosis not present

## 2020-04-20 NOTE — Telephone Encounter (Signed)
Chak[facility]ra with [facility] is at the home with patient now.... pt was discharged from [facility] yesterday 04/19/2020... pt is had 2 dark, brown episodes of vomiting yesterday.... had a Lovenox inj yesterday... denies fever, pain or any other Sx.... Gretta Began wants to know if you think patient should go to the hospital or if you would like her to order stat labs...Marland Kitchen please advise

## 2020-04-20 NOTE — Telephone Encounter (Signed)
Katherine Oliver called back and left a message on triage line stating if we call today, they can get a nurse out tomorrow to draw the labs.

## 2020-04-20 NOTE — Telephone Encounter (Signed)
Patient discharged from SNF yesterday - can we call today or Monday for TCM hosp f/u phone call and schedule 30 min OV within 2 wks of discharge. Thanks.

## 2020-04-20 NOTE — Telephone Encounter (Signed)
Agree with this. Thanks.  

## 2020-04-20 NOTE — Telephone Encounter (Signed)
Nynica with [facility] called requesting verbal orders   She is requesting orders for PT, evaluated and treat And would like to do this next week   C/b # [phone]

## 2020-04-20 NOTE — Telephone Encounter (Signed)
Do they have access to do stat labs today? Would order CBC, BMP today.  If any more vomiting dark or coffee ground emesis, or any black tarry stools, rec ugent eval over weekend.

## 2020-04-20 NOTE — Telephone Encounter (Signed)
[facility]ed [facility] stated talk to the pt and stated no vomiting or any black stool.Verbal order lab for CBC and BMP by Dr. Danise Mina and notified chakillra as soon lab report is available to please send to Dr. Marilynn Latino.

## 2020-04-21 LAB — CBC AND DIFFERENTIAL
Hemoglobin: 4.5 — AB (ref 12.0–16.0)
Hemoglobin: 4.5 — AB (ref 12.0–16.0)
Platelets: 162 (ref 150–399)
WBC: 8.2

## 2020-04-21 LAB — COMPREHENSIVE METABOLIC PANEL: Calcium: 9.5 (ref 8.7–10.7)

## 2020-04-21 LAB — BASIC METABOLIC PANEL
BUN: 37 — AB (ref 4–21)
Creatinine: 1 (ref 0.5–1.1)
Glucose: 253
Potassium: 4.2 (ref 3.4–5.3)
Sodium: 133 — AB (ref 137–147)

## 2020-04-22 ENCOUNTER — Inpatient Hospital Stay: Payer: Self-pay

## 2020-04-22 ENCOUNTER — Other Ambulatory Visit: Payer: Self-pay

## 2020-04-22 ENCOUNTER — Encounter (HOSPITAL_COMMUNITY): Payer: Self-pay | Admitting: Internal Medicine

## 2020-04-22 ENCOUNTER — Encounter: Payer: Self-pay | Admitting: Emergency Medicine

## 2020-04-22 ENCOUNTER — Emergency Department: Payer: Medicare Other

## 2020-04-22 ENCOUNTER — Inpatient Hospital Stay
Admission: AD | Admit: 2020-04-22 | Discharge: 2020-04-22 | DRG: 377 | Disposition: A | Discharge status: Other Acute Inpatient Hospital | Payer: Medicare Other | Attending: Family Medicine | Admitting: Family Medicine

## 2020-04-22 ENCOUNTER — Inpatient Hospital Stay (HOSPITAL_COMMUNITY)
Admit: 2020-04-22 | Discharge: 2020-04-28 | DRG: 377 | Disposition: A | Discharge status: Home Health | Payer: Medicare Other | Source: Other Acute Inpatient Hospital | Attending: Infectious Disease | Admitting: Infectious Disease

## 2020-04-22 DIAGNOSIS — N179 Acute kidney failure, unspecified: Secondary | ICD-10-CM | POA: Diagnosis present

## 2020-04-22 DIAGNOSIS — I10 Essential (primary) hypertension: Secondary | ICD-10-CM | POA: Diagnosis present

## 2020-04-22 DIAGNOSIS — K219 Gastro-esophageal reflux disease without esophagitis: Secondary | ICD-10-CM | POA: Diagnosis present

## 2020-04-22 DIAGNOSIS — Z8719 Personal history of other diseases of the digestive system: Secondary | ICD-10-CM

## 2020-04-22 DIAGNOSIS — K921 Melena: Principal | ICD-10-CM | POA: Diagnosis present

## 2020-04-22 DIAGNOSIS — Z8711 Personal history of peptic ulcer disease: Secondary | ICD-10-CM | POA: Diagnosis not present

## 2020-04-22 DIAGNOSIS — R531 Weakness: Secondary | ICD-10-CM

## 2020-04-22 DIAGNOSIS — I480 Paroxysmal atrial fibrillation: Secondary | ICD-10-CM | POA: Diagnosis present

## 2020-04-22 DIAGNOSIS — K254 Chronic or unspecified gastric ulcer with hemorrhage: Secondary | ICD-10-CM | POA: Diagnosis present

## 2020-04-22 DIAGNOSIS — E1110 Type 2 diabetes mellitus with ketoacidosis without coma: Secondary | ICD-10-CM

## 2020-04-22 DIAGNOSIS — R609 Edema, unspecified: Secondary | ICD-10-CM | POA: Diagnosis not present

## 2020-04-22 DIAGNOSIS — Z7902 Long term (current) use of antithrombotics/antiplatelets: Secondary | ICD-10-CM | POA: Diagnosis not present

## 2020-04-22 DIAGNOSIS — Z800 Family history of malignant neoplasm of digestive organs: Secondary | ICD-10-CM | POA: Diagnosis not present

## 2020-04-22 DIAGNOSIS — Z87440 Personal history of urinary (tract) infections: Secondary | ICD-10-CM | POA: Diagnosis not present

## 2020-04-22 DIAGNOSIS — J309 Allergic rhinitis, unspecified: Secondary | ICD-10-CM | POA: Diagnosis present

## 2020-04-22 DIAGNOSIS — D62 Acute posthemorrhagic anemia: Secondary | ICD-10-CM | POA: Diagnosis present

## 2020-04-22 DIAGNOSIS — Z86718 Personal history of other venous thrombosis and embolism: Secondary | ICD-10-CM

## 2020-04-22 DIAGNOSIS — Z7984 Long term (current) use of oral hypoglycemic drugs: Secondary | ICD-10-CM | POA: Diagnosis not present

## 2020-04-22 DIAGNOSIS — Z833 Family history of diabetes mellitus: Secondary | ICD-10-CM

## 2020-04-22 DIAGNOSIS — Z79890 Hormone replacement therapy (postmenopausal): Secondary | ICD-10-CM

## 2020-04-22 DIAGNOSIS — Z8249 Family history of ischemic heart disease and other diseases of the circulatory system: Secondary | ICD-10-CM

## 2020-04-22 DIAGNOSIS — K920 Hematemesis: Secondary | ICD-10-CM | POA: Diagnosis present

## 2020-04-22 DIAGNOSIS — K760 Fatty (change of) liver, not elsewhere classified: Secondary | ICD-10-CM | POA: Diagnosis present

## 2020-04-22 DIAGNOSIS — Z79899 Other long term (current) drug therapy: Secondary | ICD-10-CM

## 2020-04-22 DIAGNOSIS — E871 Hypo-osmolality and hyponatremia: Secondary | ICD-10-CM | POA: Diagnosis present

## 2020-04-22 DIAGNOSIS — Z91048 Other nonmedicinal substance allergy status: Secondary | ICD-10-CM

## 2020-04-22 DIAGNOSIS — K8591 Acute pancreatitis with uninfected necrosis, unspecified: Secondary | ICD-10-CM | POA: Diagnosis not present

## 2020-04-22 DIAGNOSIS — K8050 Calculus of bile duct without cholangitis or cholecystitis without obstruction: Secondary | ICD-10-CM | POA: Diagnosis not present

## 2020-04-22 DIAGNOSIS — Z96651 Presence of right artificial knee joint: Secondary | ICD-10-CM | POA: Diagnosis present

## 2020-04-22 DIAGNOSIS — N1831 Chronic kidney disease, stage 3a: Secondary | ICD-10-CM | POA: Diagnosis not present

## 2020-04-22 DIAGNOSIS — I4891 Unspecified atrial fibrillation: Secondary | ICD-10-CM | POA: Diagnosis present

## 2020-04-22 DIAGNOSIS — E1121 Type 2 diabetes mellitus with diabetic nephropathy: Secondary | ICD-10-CM | POA: Diagnosis present

## 2020-04-22 DIAGNOSIS — Z87442 Personal history of urinary calculi: Secondary | ICD-10-CM | POA: Diagnosis not present

## 2020-04-22 DIAGNOSIS — Z882 Allergy status to sulfonamides status: Secondary | ICD-10-CM

## 2020-04-22 DIAGNOSIS — E1136 Type 2 diabetes mellitus with diabetic cataract: Secondary | ICD-10-CM | POA: Diagnosis not present

## 2020-04-22 DIAGNOSIS — Z794 Long term (current) use of insulin: Secondary | ICD-10-CM | POA: Diagnosis not present

## 2020-04-22 DIAGNOSIS — Z881 Allergy status to other antibiotic agents status: Secondary | ICD-10-CM

## 2020-04-22 DIAGNOSIS — D500 Iron deficiency anemia secondary to blood loss (chronic): Secondary | ICD-10-CM | POA: Diagnosis not present

## 2020-04-22 DIAGNOSIS — K861 Other chronic pancreatitis: Secondary | ICD-10-CM | POA: Diagnosis present

## 2020-04-22 DIAGNOSIS — I129 Hypertensive chronic kidney disease with stage 1 through stage 4 chronic kidney disease, or unspecified chronic kidney disease: Secondary | ICD-10-CM | POA: Diagnosis not present

## 2020-04-22 DIAGNOSIS — K922 Gastrointestinal hemorrhage, unspecified: Secondary | ICD-10-CM | POA: Diagnosis present

## 2020-04-22 DIAGNOSIS — K280 Acute gastrojejunal ulcer with hemorrhage: Secondary | ICD-10-CM | POA: Diagnosis not present

## 2020-04-22 DIAGNOSIS — Z83438 Family history of other disorder of lipoprotein metabolism and other lipidemia: Secondary | ICD-10-CM | POA: Diagnosis not present

## 2020-04-22 DIAGNOSIS — K863 Pseudocyst of pancreas: Secondary | ICD-10-CM | POA: Diagnosis present

## 2020-04-22 DIAGNOSIS — M6281 Muscle weakness (generalized): Secondary | ICD-10-CM | POA: Diagnosis not present

## 2020-04-22 DIAGNOSIS — D649 Anemia, unspecified: Secondary | ICD-10-CM | POA: Diagnosis present

## 2020-04-22 DIAGNOSIS — I82409 Acute embolism and thrombosis of unspecified deep veins of unspecified lower extremity: Secondary | ICD-10-CM | POA: Diagnosis not present

## 2020-04-22 DIAGNOSIS — Z9049 Acquired absence of other specified parts of digestive tract: Secondary | ICD-10-CM | POA: Diagnosis not present

## 2020-04-22 DIAGNOSIS — M8580 Other specified disorders of bone density and structure, unspecified site: Secondary | ICD-10-CM | POA: Diagnosis present

## 2020-04-22 DIAGNOSIS — E1122 Type 2 diabetes mellitus with diabetic chronic kidney disease: Secondary | ICD-10-CM | POA: Diagnosis not present

## 2020-04-22 DIAGNOSIS — Z823 Family history of stroke: Secondary | ICD-10-CM | POA: Diagnosis not present

## 2020-04-22 DIAGNOSIS — Z20822 Contact with and (suspected) exposure to covid-19: Secondary | ICD-10-CM | POA: Diagnosis present

## 2020-04-22 DIAGNOSIS — Z91018 Allergy to other foods: Secondary | ICD-10-CM

## 2020-04-22 DIAGNOSIS — Z931 Gastrostomy status: Secondary | ICD-10-CM

## 2020-04-22 DIAGNOSIS — Z87898 Personal history of other specified conditions: Secondary | ICD-10-CM

## 2020-04-22 DIAGNOSIS — Z86010 Personal history of colonic polyps: Secondary | ICD-10-CM

## 2020-04-22 DIAGNOSIS — R5383 Other fatigue: Secondary | ICD-10-CM | POA: Diagnosis present

## 2020-04-22 DIAGNOSIS — E785 Hyperlipidemia, unspecified: Secondary | ICD-10-CM | POA: Diagnosis present

## 2020-04-22 DIAGNOSIS — I700 Atherosclerosis of aorta: Secondary | ICD-10-CM | POA: Diagnosis present

## 2020-04-22 DIAGNOSIS — R1110 Vomiting, unspecified: Secondary | ICD-10-CM | POA: Diagnosis not present

## 2020-04-22 DIAGNOSIS — E1165 Type 2 diabetes mellitus with hyperglycemia: Secondary | ICD-10-CM | POA: Diagnosis not present

## 2020-04-22 DIAGNOSIS — E118 Type 2 diabetes mellitus with unspecified complications: Secondary | ICD-10-CM | POA: Diagnosis present

## 2020-04-22 DIAGNOSIS — E1169 Type 2 diabetes mellitus with other specified complication: Secondary | ICD-10-CM | POA: Diagnosis present

## 2020-04-22 DIAGNOSIS — D631 Anemia in chronic kidney disease: Secondary | ICD-10-CM | POA: Diagnosis not present

## 2020-04-22 HISTORY — DX: Type 2 diabetes mellitus with ketoacidosis without coma: E11.10

## 2020-04-22 LAB — CBC WITH DIFFERENTIAL/PLATELET
Abs Immature Granulocytes: 0.45 10*3/uL — ABNORMAL HIGH (ref 0.00–0.07)
Basophils Absolute: 0 10*3/uL (ref 0.0–0.1)
Basophils Relative: 0 %
Eosinophils Absolute: 0 10*3/uL (ref 0.0–0.5)
Eosinophils Relative: 0 %
HCT: 13.6 % — CL (ref 36.0–46.0)
Hemoglobin: 4.4 g/dL — CL (ref 12.0–15.0)
Immature Granulocytes: 5 %
Lymphocytes Relative: 7 %
Lymphs Abs: 0.7 10*3/uL (ref 0.7–4.0)
MCH: 28 pg (ref 26.0–34.0)
MCHC: 32.4 g/dL (ref 30.0–36.0)
MCV: 86.6 fL (ref 80.0–100.0)
Monocytes Absolute: 0.6 10*3/uL (ref 0.1–1.0)
Monocytes Relative: 6 %
Neutro Abs: 8 10*3/uL — ABNORMAL HIGH (ref 1.7–7.7)
Neutrophils Relative %: 82 %
Platelets: 188 10*3/uL (ref 150–400)
RBC: 1.57 MIL/uL — ABNORMAL LOW (ref 3.87–5.11)
RDW: 18.5 % — ABNORMAL HIGH (ref 11.5–15.5)
WBC: 9.7 10*3/uL (ref 4.0–10.5)
nRBC: 1 % — ABNORMAL HIGH (ref 0.0–0.2)

## 2020-04-22 LAB — GLUCOSE, CAPILLARY
Glucose-Capillary: 460 mg/dL — ABNORMAL HIGH (ref 70–99)
Glucose-Capillary: 427 mg/dL — ABNORMAL HIGH (ref 70–99)
Glucose-Capillary: 291 mg/dL — ABNORMAL HIGH (ref 70–99)
Glucose-Capillary: 184 mg/dL — ABNORMAL HIGH (ref 70–99)
Glucose-Capillary: 131 mg/dL — ABNORMAL HIGH (ref 70–99)

## 2020-04-22 LAB — BASIC METABOLIC PANEL
Anion gap: 10 (ref 5–15)
BUN: 25 mg/dL — ABNORMAL HIGH (ref 8–23)
CO2: 19 mmol/L — ABNORMAL LOW (ref 22–32)
Calcium: 8.8 mg/dL — ABNORMAL LOW (ref 8.9–10.3)
Chloride: 104 mmol/L (ref 98–111)
Creatinine, Ser: 0.93 mg/dL (ref 0.44–1.00)
GFR calc Af Amer: >60 mL/min (ref >60)
GFR calc non Af Amer: >60 mL/min (ref >60)
Glucose, Bld: 420 mg/dL — ABNORMAL HIGH (ref 70–99)
Potassium: 4.1 mmol/L (ref 3.5–5.1)
Sodium: 133 mmol/L — ABNORMAL LOW (ref 135–145)

## 2020-04-22 LAB — COMPREHENSIVE METABOLIC PANEL
ALT: 10 U/L (ref 0–44)
AST: 11 U/L — ABNORMAL LOW (ref 15–41)
Albumin: 3.2 g/dL — ABNORMAL LOW (ref 3.5–5.0)
Alkaline Phosphatase: 71 U/L (ref 38–126)
Anion gap: 16 — ABNORMAL HIGH (ref 5–15)
BUN: 31 mg/dL — ABNORMAL HIGH (ref 8–23)
CO2: 16 mmol/L — ABNORMAL LOW (ref 22–32)
Calcium: 9.4 mg/dL (ref 8.9–10.3)
Chloride: 97 mmol/L — ABNORMAL LOW (ref 98–111)
Creatinine, Ser: 1.05 mg/dL — ABNORMAL HIGH (ref 0.44–1.00)
GFR calc Af Amer: >60 mL/min (ref >60)
GFR calc non Af Amer: 52 mL/min — ABNORMAL LOW (ref >60)
Glucose, Bld: 502 mg/dL (ref 70–99)
Potassium: 4.2 mmol/L (ref 3.5–5.1)
Sodium: 129 mmol/L — ABNORMAL LOW (ref 135–145)
Total Bilirubin: 1.1 mg/dL (ref 0.3–1.2)
Total Protein: 5.9 g/dL — ABNORMAL LOW (ref 6.5–8.1)

## 2020-04-22 LAB — BLOOD GAS, ARTERIAL
Acid-base deficit: 6.8 mmol/L — ABNORMAL HIGH (ref 0.0–2.0)
Bicarbonate: 17.7 mmol/L — ABNORMAL LOW (ref 20.0–28.0)
O2 Saturation: 95.3 %
Patient temperature: 37
pCO2 arterial: 30 mmHg — ABNORMAL LOW (ref 32.0–48.0)
pH, Arterial: 7.38 (ref 7.350–7.450)
pO2, Arterial: 79 mmHg — ABNORMAL LOW (ref 83.0–108.0)

## 2020-04-22 LAB — PROTIME-INR
INR: 1.2 (ref 0.8–1.2)
Prothrombin Time: 14.4 seconds (ref 11.4–15.2)

## 2020-04-22 LAB — SARS CORONAVIRUS 2 BY RT PCR (HOSPITAL ORDER, PERFORMED IN ~~LOC~~ HOSPITAL LAB): SARS Coronavirus 2: NEGATIVE

## 2020-04-22 LAB — BETA-HYDROXYBUTYRIC ACID
Beta-Hydroxybutyric Acid: 3.89 mmol/L — ABNORMAL HIGH (ref 0.05–0.27)
Beta-Hydroxybutyric Acid: 2.46 mmol/L — ABNORMAL HIGH (ref 0.05–0.27)

## 2020-04-22 LAB — ABO/RH: ABO/RH(D): B POS

## 2020-04-22 LAB — APTT: aPTT: 27 seconds (ref 24–36)

## 2020-04-22 LAB — PREPARE RBC (CROSSMATCH)

## 2020-04-22 LAB — LIPASE, BLOOD: Lipase: 38 U/L (ref 11–51)

## 2020-04-22 MED ORDER — SODIUM CHLORIDE 0.9 % IV SOLN: INTRAVENOUS | Status: DC

## 2020-04-22 MED ORDER — METOPROLOL TARTRATE 5 MG/5ML IV SOLN
2.5000 mg | Freq: Four times a day (QID) | INTRAVENOUS | Status: DC
  Administered 2020-04-22: 2.5 mg via INTRAVENOUS
  Filled 2020-04-22: qty 5

## 2020-04-22 MED ORDER — DEXTROSE 50 % IV SOLN: 0.0000 mL | INTRAVENOUS | Status: DC | PRN

## 2020-04-22 MED ORDER — PANTOPRAZOLE SODIUM 40 MG IV SOLR: 40.0000 mg | Freq: Two times a day (BID) | INTRAVENOUS | Status: DC

## 2020-04-22 MED ORDER — SODIUM CHLORIDE 0.9 % IV SOLN
8.0000 mg/h | INTRAVENOUS | Status: DC
  Administered 2020-04-22: 8 mg/h via INTRAVENOUS
  Filled 2020-04-22: qty 80

## 2020-04-22 MED ORDER — INSULIN REGULAR(HUMAN) IN NACL 100-0.9 UT/100ML-% IV SOLN
INTRAVENOUS | Status: DC
  Administered 2020-04-22: 10.5 [IU]/h via INTRAVENOUS
  Filled 2020-04-22: qty 100

## 2020-04-22 MED ORDER — PANTOPRAZOLE SODIUM 40 MG IV SOLR
40.0000 mg | Freq: Two times a day (BID) | INTRAVENOUS | Status: DC
  Administered 2020-04-23 – 2020-04-28 (×12): 40 mg via INTRAVENOUS
  Filled 2020-04-22 (×12): qty 40

## 2020-04-22 MED ORDER — INSULIN REGULAR(HUMAN) IN NACL 100-0.9 UT/100ML-% IV SOLN: INTRAVENOUS | Status: DC

## 2020-04-22 MED ORDER — LACTATED RINGERS IV BOLUS
1000.0000 mL | Freq: Once | INTRAVENOUS | Status: AC
  Administered 2020-04-22: 1000 mL via INTRAVENOUS

## 2020-04-22 MED ORDER — DEXTROSE-NACL 5-0.45 % IV SOLN: INTRAVENOUS | Status: DC

## 2020-04-22 MED ORDER — SODIUM CHLORIDE 0.9 % IV SOLN
80.0000 mg | Freq: Once | INTRAVENOUS | Status: AC
  Administered 2020-04-22: 80 mg via INTRAVENOUS
  Filled 2020-04-22: qty 80

## 2020-04-22 MED ORDER — INSULIN ASPART 100 UNIT/ML ~~LOC~~ SOLN: 15.0000 [IU] | Freq: Once | SUBCUTANEOUS | Status: DC

## 2020-04-22 MED ORDER — IOHEXOL 350 MG/ML SOLN
100.0000 mL | Freq: Once | INTRAVENOUS | Status: AC | PRN
  Administered 2020-04-22: 100 mL via INTRAVENOUS

## 2020-04-22 MED ORDER — HYDRALAZINE HCL 20 MG/ML IJ SOLN: 10.0000 mg | INTRAMUSCULAR | Status: DC | PRN

## 2020-04-22 MED ORDER — SODIUM CHLORIDE 0.9 % IV SOLN
10.0000 mL/h | Freq: Once | INTRAVENOUS | Status: AC
  Administered 2020-04-22: 10 mL/h via INTRAVENOUS

## 2020-04-22 NOTE — Progress Notes (Signed)
Assessed BUE for PICC placement.  No suitable veins noted.  All measured occupancy in the vessels between 55 and 92% with the tourniquet on.  Pt then stated that she has a hx at [facility] of them placing a PICC centrally in radiology due to unable to pass via extremity.  Lexie RN notified.  Spoke with Dr Patsey Berthold in person re access choices. Thank you for the referral.

## 2020-04-22 NOTE — ED Notes (Signed)
Date and time results received: 04/22/20 12:38 (use smartphrase ".now" to insert current time)  Test: Glucose Critical Value: 502  Name of Provider Notified: Dr. [name]   Orders Received? Or Actions Taken?: Actions Taken: Dr. [name] acknowledge critical result, new orders to adminiter bolus NS

## 2020-04-22 NOTE — ED Triage Notes (Signed)
Pt to ED via POV, pt states that her MD called her and told her that she needed to come to ED due to abnormal labs. MD called and informed Charge RN that pts Hgb was 4 when checked yesterday. Pt states that she is short of breath and fatigue. Pt is very pale. Pt recently had procedure for pancreatitis. Pt states that she has had some vomiting and dark stool but that she has not noticed blood in it. Pt is in NAD.

## 2020-04-22 NOTE — Discharge Summary (Addendum)
TRANSFER SUMMARY   Katherine Oliver [id number]:[id number]  is a 74 y.o. 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 Allergic rhinitis   . Arthritis   . 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 nephropathy ([place])    DSME at [facility] 01/2016   . Frequent epistaxis 05/16/2019   S/p cauterization with resolution 2020  . GERD (gastroesophageal reflux disease)   . Hepatic steatosis    by abd [place] 05/2012, mild transaminitis - normal iron sat and viral hep panel (2011), stable [place] 2017  . History of chicken pox   . History of measles   . History of recurrent UTIs    on chronic keflex  . HLD (hyperlipidemia)   . HTN (hypertension)   . 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 Kidney cyst, acquired 01/2016   L kidney by [place]  . Kidney stone 01/2016   L kidney by [place]  . Lung nodules 11/2013   overall stable on f/u CT 01/2016  . Osteopenia 06/2013   mild, forearm T -1.1, hip and spine WNL  . Pancreatitis   . Polycythemia    mild, stable (2013)  . Primary  localized osteoarthritis of right knee 01/05/2019  . Rosacea    metrogel    Past Surgical History:  Procedure Laterality Date  . APPENDECTOMY  1987  . BILIARY STENT PLACEMENT  12/14/2019   Procedure: BILIARY STENT PLACEMENT;  Surgeon: [name], MD;  Location: [place];  Service: Endoscopy;;  . BREAST BIOPSY Right 1963   benign  . BREAST BIOPSY Right 08/2014   benign- core  . cardiolite stress test  04/2004   normal  . CESAREAN SECTION  [id number];9702   x2  . CHOLECYSTECTOMY  2003  . COLONOSCOPY  09/26/2008   adenomatous polyp, rpt 3-5 yrs  . COLONOSCOPY  08/2012   adenomatous polyps, diverticulosis, rec rpt 5 yrs [name])  . COLONOSCOPY WITH PROPOFOL N/A 02/05/2018   4TA, SSA, diverticulosis, rpt 3 yrs [name], [name], MD)  . dexa  2003   normal  . dexa  06/2013   [facility] - Tscore -1.1 forearm, normal spine and femur  . ERCP N/A 12/14/2019   Procedure: ENDOSCOPIC RETROGRADE CHOLANGIOPANCREATOGRAPHY (ERCP);  Surgeon: [name], MD;  Location: [place];  Service: Endoscopy;  Laterality: N/A;  . ERCP  01/2020   nonbleeding gastric ulcer - [facility] hospitalization (Dr Mont Dutton)  . ESOPHAGOGASTRODUODENOSCOPY N/A 12/19/2019   Procedure: ESOPHAGOGASTRODUODENOSCOPY (EGD);  Surgeon: [name], MD;  Location: [facility] ENDOSCOPY;  Service: Endoscopy;  Laterality: N/A;  . ESOPHAGOGASTRODUODENOSCOPY  01/2020   pre existing AXIOS cystogastrostomy stent s/p necrosectomy - [facility] hospitalization (Dr Mont Dutton)  .  IR FLUORO GUIDE CV LINE RIGHT  12/27/2019  . IR REMOVAL TUN CV CATH W/O FL  01/03/2020  . IR REPLC DUODEN/JEJUNO TUBE PERCUT W/FLUORO  04/01/2020  . SPHINCTEROTOMY  12/14/2019   Procedure: SPHINCTEROTOMY;  Surgeon: [name], MD;  Location: [place];  Service: Endoscopy;;  . 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 TRANSTHORACIC ECHOCARDIOGRAM   04/2019   EF 55-60%, modLVH, impaired relaxation   . TRIGGER FINGER RELEASE  2007;2010;2011   bilateral  . TRIGGER FINGER RELEASE  02/2017  . 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 fib, DVT on Lovenox who had a prolonged course of hospitalization from 12/13/2019 up till 2/87/8676 due to complications of necrotizing pancreatitis.  Patient was initially admitted 12/13/2019-01/03/2020 for presumed gallstone pancreatitis.  Patient had undergone ERCP which showed biliary sludge with successful placement of biliary stent.  However patient developed AKI requiring CRRT, A. fib with RVR.  She then developed worsening abdominal pain, nausea and distention and had evidence of new peripancreatic fluid collections despite CBD stent.  She was transferred to [facility] for further care where she underwent cystogastrostomy placement with double-pigtail stents, endoscopic necrosectomy and percutaneous drainage of the cyst.  During the hospitalization she also developed gastroduodenal artery bleeding and required embolization.  She is also has been on Lovenox for DVT.  She was discharged to SNF on 02/24/2020 with drains in place. Patient was discharged from [facility] rehab on 04/19/2020.  Per patient, she had off and on episodes of coffee-ground emesis at the facility.  Last week, she also developed dark tarry stools, none today or yesterday. Patient's home health nurse got the labs done and showed hemoglobin of 4.5, creatinine was normal.  Patient was advised by her PCP to go straight to the ED. She has been feeling very fatigued, shortness of breath on exertion, dizzy and lightheaded.  No chest pain, no hematuria.  She has  poor appetite however has been eating regular diet.  She has G-tube as well that was placed during the previous admission.  She has been ambulating with a walker. Denied any NSAID use or steroid use, does not drink any alcohol.  Patient has been on Lovenox for anticoagulation.   ED work-up/course:  In ED, temp 98.1, respiratory 28, pulse 117, BP 118/62, O2 sats 94% on room air Labs showed sodium 129, has chronic low sodium, 132 on 03/13/2020, CO2 16, glucose 502, BUN 13, creatinine 1.05, baseline creatinine 0.4 WBCs 9.7, hemoglobin 4.4, hematocrit 13.6, platelets 188, hemoglobin was 9.7 on 03/11/2020  Upper GI bleed with acute blood loss anemia -Hemoglobin 4.4, hematocrit 13.6, presented with hematemesis off and on for 2 weeks and melanotic stools for 1 week -Continue n.p.o. status, transfusing 2 units packed RBCs, placed on PPI drip after bolus -[facility] has been consulted for endoscopy. -Hold Lovenox, obtain serial H&H, anemia panel -Patient has a history of gastroduodenal artery bleeding during the prior history at [facility] requiring embolization, continue n.p.o. status until CT angiogram abdomen pelvis rules out any ongoing bleeding -Patient has a difficult access, unable to place power PICC, unable  to obtain CTA.  CCM was consulted, Dr. Patsey Berthold recommended transfer to [facility], patient has history of arterial bleeding hence needs access, CTA and IR if she has any underlying arterial bleed.  IR services not available currently at [facility] for access or procedure if needed    Diabetes mellitus type 2, uncontrolled, with complications ([place]) with hyperglycemia, anion gap acidosis, early DKA -Obtain beta hydroxy butyrate acid, UA to assess for ketones, ABG -Per patient she was on Lantus 12 units daily (confirmed from care everywhere), and sliding scale insulin.  However while she was at the facility, she did not receive basal insulin -Continue IV fluid hydration, placed on hyperglycemia crisis endo  tool - obtain hemoglobin A1c  Paroxysmal atrial fibrillation with RVR -Rate currently elevated likely due to severe anemia -Will place on metoprolol 2.5 mg IV every 6 hours with parameters -Hold Lovenox  Hypertension -BP currently stable, hold off on benazepril, amlodipine, Cardizem p.o.    GERD (gastroesophageal reflux disease) -Placed on IV PPI drip after bolus    AKI (acute kidney injury) ([place]) -Likely due to severe anemia, #1, on benazepril, poor p.o. intake -Baseline creatinine 0.49 on 03/13/2020, presented with creatinine of 1.05 -Continue gentle hydration, hold ACE inhibitor  Hyponatremia -Likely pseudohyponatremia with hyperglycemia, sodium 129, corrected 139    Gastrostomy in place [facility]) -Per patient she is now eating regular diet, -Keep n.p.o. until CTA abdomen pelvis is completed.  If negative for any arterial bleeding, may place on clear liquid diet and n.p.o. after midnight for endoscopy in a.m.  Generalized debility, deconditioning -PT evaluation  History of DVT -Continue to hold Lovenox, may place IVC filter, with ongoing GI bleeding, patient will likely not be candidate for anticoagulation     Subjective:   No acute complaints except very weak, fatigued, shortness of breath on exertion, dizzy  Objective:   Blood pressure 127/69, pulse (!) 102, temperature 97.9 F (36.6 C), temperature source Oral, resp. rate (!) 23, height 5' (1.524 m), weight 63 kg, SpO2 96 %.  Intake/Output Summary (Last 24 hours) at 04/22/2020 1939 Last data filed at 04/22/2020 1900 Gross per 24 hour  Intake 1300 ml  Output --  Net 1300 ml    Exam Awake Alert, Oriented *3, No new F.N deficits, Normal affect HEENT:EOMI, PERLA Neck: Supple,no JVD, No cervical lymphadenopathy appreciated.  Chest: clear to ausculatation, no wheezing, rales and rhonchi CVS: RRR,No murmurs, rubs or gallops Abdomen: Normal bowel sounds, Soft, Non tender, No organomegaly, No rebound or  guarding, G-tube+ Extremeties: No Cyanosis, Clubbing or edema, No new Rash or bruise  Data Review  CBC w Diff:  Lab Results  Component Value Date   WBC 9.7 04/22/2020   HGB 4.4 (LL) 04/22/2020   HCT 13.6 (LL) 04/22/2020   PLT 188 04/22/2020   LYMPHOPCT 7 04/22/2020   MONOPCT 6 04/22/2020   EOSPCT 0 04/22/2020   BASOPCT 0 04/22/2020   CMP:  Lab Results  Component Value Date   NA 133 (L) 04/22/2020   K 4.1 04/22/2020   CL 104 04/22/2020   CO2 19 (L) 04/22/2020   BUN 25 (H) 04/22/2020   CREATININE 0.93 04/22/2020   CREATININE 1.03 10/20/2013   PROT 5.9 (L) 04/22/2020   ALBUMIN 3.2 (L) 04/22/2020   BILITOT 1.1 04/22/2020   ALKPHOS 71 04/22/2020   AST 11 (L) 04/22/2020   ALT 10 04/22/2020  .  Significant Tests   Significant Diagnostic Studies:  [place] EKG SITE RITE  Result Date: 04/22/2020 If Site  Rite image not attached, placement could not be confirmed due to current cardiac rhythm.   2D ECHO:    Scheduled Meds: . metoprolol tartrate  2.5 mg Intravenous Q6H  . [START ON 04/26/2020] pantoprazole  40 mg Intravenous Q12H   Continuous Infusions: . sodium chloride 75 mL/hr at 04/22/20 1901  . dextrose 5 % and 0.45% NaCl Stopped (04/22/20 1900)  . insulin 10.5 Units/hr (04/22/20 1857)  . pantoprozole (PROTONIX) infusion Stopped (04/22/20 1859)       The risks and  Benefits of transporting including disability, death and ...Marland KitchenMarland KitchenMarland Kitchen were discussed with the patient or health power of attorney and were agreeable to the plan and further management.  Total Time in preparing paper work, todays exam and data evaluation: 35 minutes  Signed: [name] M.D. [facility] 04/22/2020, 7:39 PM

## 2020-04-22 NOTE — ED Notes (Signed)
Date and time results received: 04/22/20  (use smartphrase ".now" to insert current time)  Test: Hbg, HCT  Critical Value: HBG 4.4 HCT 13.6  Name of Provider Notified:Dr. [name]   Orders Received? Or Actions Taken?: Actions Taken: Dr. [name] acknowledge critical results, orders to administered 2 units of blood

## 2020-04-22 NOTE — Progress Notes (Signed)
Dear Doctor: [name] This patient has been identified as a candidate for PICC for the following reason (s): poor veins/poor circulatory system (CHF, COPD, emphysema, diabetes, steroid use, IV drug abuse, etc.) and restarts due to phlebitis and infiltration in 24 hours A midline has already been attempted. If you agree, please write an order for the indicated device. For any questions contact the Vascular Access Team at [phone] if no answer, please leave a message.  Thank you for supporting the early vascular access assessment program.

## 2020-04-22 NOTE — ED Notes (Signed)
Per IV team, unable to place midline and recommend PICC. EDP notified.

## 2020-04-22 NOTE — H&P (Addendum)
History and Physical        Hospital Admission Note Date: 04/22/2020  Patient name: Katherine Oliver Medical record number: [id number] Date of birth: 12-15-1945 Age: 74 y.o. Gender: female  PCP: [name], MD    Patient coming from: home   I have reviewed all records in the [facility].    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 fib, DVT on Lovenox who had a prolonged course of hospitalization from 12/13/2019 up till 6/64/4034 due to complications of necrotizing pancreatitis.  Patient was initially admitted 12/13/2019-01/03/2020 for presumed gallstone pancreatitis.  Patient had undergone ERCP which showed biliary sludge with successful placement of biliary stent.  However patient developed AKI requiring CRRT, A. fib with RVR.  She then developed worsening abdominal pain, nausea and distention and had evidence of new peripancreatic fluid collections despite CBD stent.  She was transferred to [facility] for further care where she underwent cystogastrostomy placement with double-pigtail stents, endoscopic necrosectomy and percutaneous drainage of the cyst.  During the hospitalization she also developed gastroduodenal artery bleeding and required embolization.  She is also has been on Lovenox for DVT.  She was discharged to SNF on 02/24/2020 with drains in place. Patient was discharged from [facility] rehab on 04/19/2020.  Per patient, she had off and on episodes of coffee-ground emesis at the facility.  Last week, she also developed dark tarry stools, none today or yesterday. Patient's home health nurse got the labs done and showed  hemoglobin of 4.5, creatinine was normal.  Patient was advised by her PCP to go straight to the ED. She has been feeling very fatigued, shortness of breath on exertion, dizzy and lightheaded.  No chest pain, no hematuria.  She has poor appetite however has been eating regular diet.  She has G-tube as well that was placed during the previous admission.  She has been ambulating with a walker. Denied any NSAID use or steroid use, does not drink any alcohol.  Patient has been on Lovenox for anticoagulation.   ED work-up/course:  In ED, temp 98.1, respiratory 28, pulse 117, BP 118/62, O2 sats 94% on room air Labs showed sodium 129, has chronic low sodium, 132 on 03/13/2020, CO2 16, glucose 502, BUN 13, creatinine 1.05, baseline creatinine 0.4 WBCs 9.7, hemoglobin 4.4, hematocrit 13.6, platelets 188, hemoglobin was 9.7 on 03/11/2020 CT angiogram abdomen and pelvis ordered but pending due to IV access issues, pending midline in ED Patient being admitted to [facility] for GI bleed,   Review of Systems: Positives marked in 'bold' Constitutional: Denies fever, chills, diaphoresis, poor appetite and fatigue.  HEENT: Denies photophobia, eye pain, redness, hearing loss, ear pain, congestion, sore throat, rhinorrhea, sneezing, mouth sores, trouble swallowing, neck pain, neck stiffness and tinnitus.   Respiratory: Shortness of breath on exertion, no chest pain Cardiovascular: Denies chest pain, palpitations and leg swelling.  Gastrointestinal: Please see HPI Genitourinary: Denies dysuria, urgency, frequency, hematuria, flank pain and difficulty urinating.  Musculoskeletal: Denies  myalgias, back pain, joint swelling, arthralgias and gait problem.  Skin: Denies pallor, rash and wound.  Neurological: + dizziness,  weakness, light-headedness, numbness and headaches.  Hematological: Denies adenopathy. Easy bruising, personal or family bleeding history  Psychiatric/Behavioral: Denies suicidal ideation, mood changes,  confusion, nervousness, sleep disturbance and agitation  Past Medical History: Past Medical History:  Diagnosis Date  . Allergic rhinitis   . Arthritis   . 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 nephropathy ([place])    DSME at [facility] 01/2016   . Frequent epistaxis 05/16/2019   S/p cauterization with resolution 2020  . GERD (gastroesophageal reflux disease)   . Hepatic steatosis    by abd [place] 05/2012, mild transaminitis - normal iron sat and viral hep panel (2011), stable [place] 2017  . History of chicken pox   . History of measles   . History of recurrent UTIs    on chronic keflex  . HLD (hyperlipidemia)   . HTN (hypertension)   . 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 Kidney cyst, acquired 01/2016   L kidney by [place]  . Kidney stone 01/2016   L kidney by [place]  . Lung nodules 11/2013   overall stable on f/u CT 01/2016  . Osteopenia 06/2013   mild, forearm T -1.1, hip and spine WNL  . Pancreatitis   . Polycythemia    mild, stable (2013)  . Primary localized osteoarthritis of right knee 01/05/2019  . Rosacea    metrogel    Past Surgical History:  Procedure Laterality Date  . APPENDECTOMY  1987  . BILIARY STENT PLACEMENT  12/14/2019   Procedure: BILIARY STENT PLACEMENT;  Surgeon: [name], MD;  Location: [place];  Service: Endoscopy;;  . BREAST BIOPSY Right 1963   benign  . BREAST BIOPSY Right 08/2014   benign- core  . cardiolite stress test  04/2004   normal  . CESAREAN SECTION  [id number]   x2  . CHOLECYSTECTOMY  2003  . COLONOSCOPY  09/26/2008   adenomatous polyp, rpt 3-5 yrs  . COLONOSCOPY  08/2012   adenomatous polyps, diverticulosis, rec rpt 5 yrs [name])  . COLONOSCOPY WITH PROPOFOL N/A 02/05/2018   4TA, SSA, diverticulosis, rpt 3 yrs [name], [name], MD)  . dexa  2003   normal  . dexa  06/2013   [facility] -  Tscore -1.1 forearm, normal spine and femur  . ERCP N/A 12/14/2019   Procedure: ENDOSCOPIC RETROGRADE CHOLANGIOPANCREATOGRAPHY (ERCP);  Surgeon: [name], MD;  Location: [place];  Service: Endoscopy;  Laterality: N/A;  . ERCP  01/2020   nonbleeding gastric ulcer - [facility] hospitalization (Dr Mont Dutton)  . ESOPHAGOGASTRODUODENOSCOPY N/A 12/19/2019   Procedure: ESOPHAGOGASTRODUODENOSCOPY (EGD);  Surgeon: [name], MD;  Location: [facility]COPY;  Service: Endoscopy;  Laterality: N/A;  . ESOPHAGOGASTRODUODENOSCOPY  01/2020   pre existing AXIOS cystogastrostomy stent s/p necrosectomy - [facility] hospitalization (Dr Mont Dutton)  . IR FLUORO GUIDE CV LINE RIGHT  12/27/2019  . IR REMOVAL TUN CV CATH W/O FL  01/03/2020  . IR REPLC DUODEN/JEJUNO TUBE PERCUT W/FLUORO  04/01/2020  . SPHINCTEROTOMY  12/14/2019   Procedure: SPHINCTEROTOMY;  Surgeon: [name], MD;  Location: [place];  Service: Endoscopy;;  . TOTAL KNEE ARTHROPLASTY Right 01/17/2019   Procedure: TOTAL KNEE ARTHROPLASTY;  Surgeon: [name], MD;  Location: [facility];  Service:  Orthopedics;  Laterality: Right;  . TRANSTHORACIC ECHOCARDIOGRAM  04/2019   EF 55-60%, modLVH, impaired relaxation   . TRIGGER FINGER RELEASE  2007;2010;2011   bilateral  . TRIGGER FINGER RELEASE  02/2017  . VAGINAL HYSTERECTOMY  1984   for menorrhagia, ovaries in place    Medications: Prior to Admission medications   Medication Sig Start Date End Date Taking? Authorizing Provider  ACCU-CHEK AVIVA PLUS test strip USE AS DIRECTED TO CHECK BLOOD SUGARS ONCE DAILY E11.21 04/17/20   [name], MD  acetaminophen (TYLENOL) 160 MG/5ML suspension Place 30.5 mg into feeding tube every 6 (six) hours as needed.    [provider]  amLODIPine Benzoate 1 MG/ML SUSP 2.5 mLs by Per J Tube route daily in the afternoon.    [provider]  benazepril (LOTENSIN) 20 MG tablet 20 mg by Per J Tube route daily.    [provider]  conjugated  estrogens (PREMARIN) vaginal cream Place 1 Applicatorful vaginally daily. Only to be applied topically 03/14/20   Nicole Kindred A, DO  DILTIAZEM HCL PO 7.5 mLs by Per J Tube route.    [provider]  enoxaparin (LOVENOX) 80 MG/0.8ML injection Inject 0.65 mLs (65 mg total) into the skin 2 (two) times daily. 03/13/20   Ezekiel Slocumb, DO  fluticasone (FLONASE) 50 MCG/ACT nasal spray Place 1 spray into both nostrils daily.    [provider]  insulin aspart (NOVOLOG) 100 UNIT/ML injection Inject 0-15 Units into the skin every 4 (four) hours. 03/13/20   Ezekiel Slocumb, DO  meclizine (ANTIVERT) 25 MG tablet Take 1 tablet (25 mg total) by mouth 3 (three) times daily as needed for nausea. 03/13/20   Ezekiel Slocumb, DO  metoCLOPramide (REGLAN) 10 MG tablet Take 1 tablet (10 mg total) by mouth every 6 (six) hours. 03/13/20   Ezekiel Slocumb, DO  metoCLOPramide (REGLAN) 5 MG/ML injection Inject 2 mLs (10 mg total) into the vein every 6 (six) hours as needed. Patient not taking: Reported on 03/06/2020 01/12/20   Donne Hazel, MD  Multiple Vitamin (MULTIVITAMIN) tablet 1 tablet by Per J Tube route daily.    [provider]  Nutritional Supplements (FEEDING SUPPLEMENT, JEVITY 1.5 CAL/FIBER,) LIQD 1,000 mLs by Per J Tube route continuous. Goal rate 50 cc/hr, reduce by 10 cc/hr as needed if nausea occurs. 03/13/20   Ezekiel Slocumb, DO  omeprazole (FIRST-OMEPRAZOLE) 2 mg/mL SUSP oral suspension Place 10 mg into feeding tube daily.    [provider]  Pancrelipase, Lip-Prot-Amyl, [phone] units TABS 2 tablets by Per J Tube route 3 (three) times daily with meals.    [provider]  Water For Irrigation, Sterile (FREE WATER) SOLN Place 30 mLs into feeding tube every 4 (four) hours. 03/13/20   Ezekiel Slocumb, DO    Allergies:   Allergies  Allergen Reactions  . Azithromycin Itching    Okay if takes benadryl along with it  . Nickel     Reaction to cheap  earrings  . Sulfa Antibiotics     Itching.   [name] [Acetic Acid]     Nausea.   . Adhesive [Tape] Rash    Paper tape - blisters    Social History:  reports that she has never smoked. She has never used smokeless tobacco. She reports previous alcohol use of about 1.0 - 2.0 standard drink of alcohol per week. She reports that she does not use drugs.  Family History: Family History  Problem  Relation Age of Onset  . Stroke Mother        several  . Hyperlipidemia Mother   . Hypertension Mother   . Cancer Father        colon  . Hypertension Father   . Hyperlipidemia Father   . Coronary artery disease Father 99       MIx1, CABG  . Cancer Paternal Aunt        abdominal  . Coronary artery disease Maternal Grandmother   . Diabetes Maternal Grandfather   . Coronary artery disease Maternal Grandfather   . Breast cancer Neg Hx     Physical Exam: Blood pressure (!) 123/57, pulse (!) 111, temperature 97.8 F (36.6 C), temperature source Oral, resp. rate (!) 22, height 5' (1.524 m), weight 63 kg, SpO2 94 %. General: Alert, awake, oriented x3, in no acute distress.  Very pale Eyes: Pale conjunctiva,anicteric sclera, pupils equal and reactive to light and accomodation, HEENT: normocephalic, atraumatic, oropharynx clear Neck: supple, no masses or lymphadenopathy, no goiter, no bruits, no JVD CVS: Regular rate and rhythm, without murmurs, rubs or gallops. No lower extremity edema Resp : Clear to auscultation bilaterally, no wheezing, rales or rhonchi. GI : Soft, nontender, nondistended, positive bowel sounds, No hepatomegaly. G-tube Musculoskeletal: No clubbing or cyanosis, positive pedal pulses. No contracture. ROM intact  Neuro: Grossly intact, no focal neurological deficits, strength 5/5 upper and lower extremities bilaterally Psych: alert and oriented x 3, normal mood and affect Skin: no rashes or lesions, warm and dry   LABS on Admission: I have personally reviewed all the labs  and imagings below    Basic Metabolic Panel: Recent Labs  Lab 04/22/20 1141  NA 129*  K 4.2  CL 97*  CO2 16*  GLUCOSE 502*  BUN 31*  CREATININE 1.05*  CALCIUM 9.4   Liver Function Tests: Recent Labs  Lab 04/22/20 1141  AST 11*  ALT 10  ALKPHOS 71  BILITOT 1.1  PROT 5.9*  ALBUMIN 3.2*   Recent Labs  Lab 04/22/20 1142  LIPASE 38   No results for input(s): AMMONIA in the last 168 hours. CBC: Recent Labs  Lab 04/22/20 1141  WBC 9.7  NEUTROABS 8.0*  HGB 4.4*  HCT 13.6*  MCV 86.6  PLT 188   Cardiac Enzymes: No results for input(s): CKTOTAL, CKMB, CKMBINDEX, TROPONINI in the last 168 hours. BNP: Invalid input(s): POCBNP CBG: Recent Labs  Lab 04/22/20 1127  GLUCAP 460*    Radiological Exams on Admission:  [place] EKG SITE RITE  Result Date: 04/22/2020 If Site Rite image not attached, placement could not be confirmed due to current cardiac rhythm.     EKG: Independently reviewed.  Rate 116, sinus tachycardia   Assessment/Plan Principal Problem: Upper GI bleed with acute blood loss anemia -Hemoglobin 4.4, hematocrit 13.6, presented with hematemesis off and on for 2 weeks and melanotic stools for 1 week -Continue n.p.o. status, transfusing 2 units packed RBCs, placed on PPI drip after bolus -GI has been consulted per ED, plan for endoscopy in a.m. -Hold Lovenox, obtain serial H&H, anemia panel -Patient has a history of gastroduodenal artery bleeding during the prior history at [facility] requiring embolization, continue n.p.o. status until CT angiogram abdomen pelvis rules out any ongoing bleeding -If negative, will place on clear liquid diet tonight and n.p.o. after midnight for possible endoscopy in a.m.  Active Problems:   Diabetes mellitus type 2, uncontrolled, with complications (HCC) with hyperglycemia, anion gap acidosis, early DKA -Obtain beta hydroxy butyrate  acid, UA to assess for ketones, ABG -Per patient she was on Lantus 12 units daily (confirmed  from care everywhere), and sliding scale insulin.  However while she was at the facility, she did not receive basal insulin -Continue IV fluid hydration, placed on hyperglycemia crisis endo tool - obtain hemoglobin A1c  Paroxysmal atrial fibrillation with RVR -Rate currently elevated likely due to severe anemia -Will place on metoprolol 2.5 mg IV every 6 hours with parameters -Hold Lovenox  Hypertension -BP currently stable, hold off on benazepril, amlodipine, Cardizem p.o.    GERD (gastroesophageal reflux disease) -Placed on IV PPI drip after bolus    AKI (acute kidney injury) ([place]) -Likely due to severe anemia, #1, on benazepril, poor p.o. intake -Baseline creatinine 0.49 on 03/13/2020, presented with creatinine of 1.05 -Continue gentle hydration, hold ACE inhibitor  Hyponatremia -Likely pseudohyponatremia with hyperglycemia, sodium 129, corrected 139    Gastrostomy in place [facility]) -Per patient she is now eating regular diet, -Keep n.p.o. until CTA abdomen pelvis is completed.  If negative for any arterial bleeding, may place on clear liquid diet and n.p.o. after midnight for endoscopy in a.m.  Generalized debility, deconditioning -PT evaluation  History of DVT -Continue to hold Lovenox, may place IVC filter, with ongoing GI bleeding, patient will likely not be candidate for anticoagulation   DVT prophylaxis: SCDs  CODE STATUS: Full CODE STATUS, discussed with patient and husband at the bedside  Consults called: EDP has consulted with gastroenterology, Dr Alice Reichert  Family Communication: Admission, patients condition and plan of care including tests being ordered have been discussed with the patient and husband who indicates understanding and agree with the plan and Code Status  Admission status: Inpatient, stepdown  The medical decision making on this patient was of high complexity and the patient is at high risk for clinical deterioration, therefore this is a level 3  admission.  Severity of Illness:      The appropriate patient status for this patient is INPATIENT. Inpatient status is judged to be reasonable and necessary in order to provide the required intensity of service to ensure the patient's safety. The patient's presenting symptoms, physical exam findings, and initial radiographic and laboratory data in the context of their chronic comorbidities is felt to place them at high risk for further clinical deterioration. Furthermore, it is not anticipated that the patient will be medically stable for discharge from the hospital within 2 midnights of admission. The following factors support the patient status of inpatient.   " The patient's presenting symptoms include acute blood loss anemia, ongoing hematemesis, melanotic stools, hemoglobin 4.4 " The worrisome physical exam findings include pale conjunctiva, severe anemia " The initial radiographic and laboratory data are worrisome because of hemoglobin 4.4, glucose 502, BUN 31, creatinine 1.05, anion gap 16, CO2 16 " The chronic co-morbidities include prolonged recent hospitalization with multiple comorbidities  * I certify that at the point of admission it is my clinical judgment that the patient will require inpatient hospital care spanning beyond 2 midnights from the point of admission due to high intensity of service, high risk for further deterioration and high frequency of surveillance required.*    Time Spent on Admission: 43mins     Shacora Zynda M.D. [facility] 04/22/2020, 3:53 PM

## 2020-04-22 NOTE — Consult Note (Addendum)
Name: Jazzlyn Huizenga MRN: [id number] DOB: 07/28/1946    ADMISSION DATE:  04/22/2020   CONSULTATION DATE:  04/22/2020  REFERRING MD : [name] MD  CHIEF COMPLAINT:  GI Bleed  BRIEF PATIENT DESCRIPTION:   74 y.o female  Admitted with GI bleed with acute blood loss anemia.  SIGNIFICANT EVENTS  6/20: Admitted to stepdown  STUDIES:  6/20: CT Abdomen and Pelvis pending  HISTORY OF PRESENT ILLNESS:  74 y.o female with PMH diabetes mellitus type 2 IDDM, A. fib, DVT on Lovenox,  nectrotizing gallstone pancreatitis with necrotic collection, s/p biliary stenting (2/21), GI bleed s/p EGD on 3/26 which showed clotted blood in the stomach concern for possible bleeding from pseudocyst cavity. IR arteriogram on 3/27 showed active bleeding from gastroduodenal artery s/p embolization at [facility] presenting to the ED  Per PCP recommendation for low hemoglobin of 4.4.  Per admitting notes from her recent admission, Patient was initially admitted 12/13/2019-01/03/2020 for presumed gallstone pancreatitis.  Patient had undergone ERCP which showed biliary sludge with successful placement of biliary stent.  However patient developed AKI requiring CRRT, A. fib with RVR.  She then developed worsening abdominal pain, nausea and distention and had evidence of new peripancreatic fluid collections despite CBD stent.  She was transferred to [facility] for further care where she underwent cystogastrostomy placement with double-pigtail stents, endoscopic necrosectomy and percutaneous drainage of the cyst.  During the hospitalization she also developed gastroduodenal artery bleeding and required embolization.  She is also has been on Lovenox for DVT.  She was discharged to SNF on 02/24/2020 with drains in place. Patient was discharged from [facility] rehab on 04/19/2020.  Per patient, she had off and on episodes of coffee-ground emesis at the facility.  Last week, she also developed dark tarry stools, none  today or yesterday. Patient's home health nurse got the labs done and showed hemoglobin of 4.5, creatinine was normal.  Patient was advised by her PCP to go straight to the ED. She has been feeling very fatigued, shortness of breath on exertion, dizzy and lightheaded.  No chest pain, no hematuria. She has G-tube as well that was placed during the previous admission.   ED Course: On arrival to the ED, she was afebrile with blood pressure 125/67 mm Hg and pulse rate 121 beats/min. There were no focal neurological deficits; she was alert and oriented x4, and he did not demonstrate any memory deficits. EKG shows sinus tachycardia. Initial labs revealed Glucose 502, Hgb 4.4, Hct 13.6, Na 129, BUN 31, Cr. 1.05, CO2 16, Anion gap 16, CTA abdomen and pelvis pending. Given her precarious condition and need for IV access PCCM consulted to assist with management.  PAST MEDICAL HISTORY :   has a past medical history of Allergic rhinitis, Arthritis, Breast mass, right (08/2014), Colon polyp (09/2008), 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 nephropathy ([place]), Frequent epistaxis (05/16/2019), GERD (gastroesophageal reflux disease), Hepatic steatosis, History of chicken pox, History of measles, History of recurrent UTIs, HLD (hyperlipidemia), HTN (hypertension), Hypertensive retinopathy of both eyes, grade 1 (06/2014), Kidney cyst, acquired (01/2016), Kidney stone (01/2016), Lung nodules (11/2013), Osteopenia (06/2013), Pancreatitis, Polycythemia, Primary localized osteoarthritis of right knee (01/05/2019), and Rosacea.  has a past surgical history that includes Appendectomy (1987); Vaginal hysterectomy (1984); Cholecystectomy (2003); dexa (2003); cardiolite stress test (04/2004); Cesarean section (4166;0630); Colonoscopy (09/26/2008); Trigger finger release (2007;2010;2011); Colonoscopy (08/2012); dexa (06/2013); Trigger finger release (02/2017); Colonoscopy with propofol (N/A, 02/05/2018); Breast biopsy (Right, 1963);  Breast biopsy (Right,  08/2014); Total knee arthroplasty (Right, 01/17/2019); transthoracic echocardiogram (04/2019); ERCP (N/A, 12/14/2019); sphincterotomy (12/14/2019); biliary stent placement (12/14/2019); Esophagogastroduodenoscopy (N/A, 12/19/2019); IR Fluoro Guide CV Line Right (12/27/2019); IR Removal Tun Cv Cath W/O FL (01/03/2020); ERCP (01/2020); Esophagogastroduodenoscopy (01/2020); and IR Replc Duoden/Jejuno Tube Percut W/Fluoro (04/01/2020).   Prior to Admission medications   Medication Sig Start Date End Date Taking? Authorizing Provider  ACCU-CHEK AVIVA PLUS test strip USE AS DIRECTED TO CHECK BLOOD SUGARS ONCE DAILY E11.21 04/17/20   [name], MD  acetaminophen (TYLENOL) 160 MG/5ML suspension Place 30.5 mg into feeding tube every 6 (six) hours as needed.    [provider]  amLODIPine Benzoate 1 MG/ML SUSP 2.5 mLs by Per J Tube route daily in the afternoon.    [provider]  benazepril (LOTENSIN) 20 MG tablet 20 mg by Per J Tube route daily.    [provider]  conjugated estrogens (PREMARIN) vaginal cream Place 1 Applicatorful vaginally daily. Only to be applied topically 03/14/20   Nicole Kindred A, DO  DILTIAZEM HCL PO 7.5 mLs by Per J Tube route.    [provider]  enoxaparin (LOVENOX) 80 MG/0.8ML injection Inject 0.65 mLs (65 mg total) into the skin 2 (two) times daily. 03/13/20   Ezekiel Slocumb, DO  fluticasone (FLONASE) 50 MCG/ACT nasal spray Place 1 spray into both nostrils daily.    [provider]  insulin aspart (NOVOLOG) 100 UNIT/ML injection Inject 0-15 Units into the skin every 4 (four) hours. 03/13/20   Ezekiel Slocumb, DO  meclizine (ANTIVERT) 25 MG tablet Take 1 tablet (25 mg total) by mouth 3 (three) times daily as needed for nausea. 03/13/20   Ezekiel Slocumb, DO  metoCLOPramide (REGLAN) 10 MG tablet Take 1 tablet (10 mg total) by mouth every 6 (six) hours. 03/13/20   Ezekiel Slocumb, DO  metoCLOPramide (REGLAN) 5  MG/ML injection Inject 2 mLs (10 mg total) into the vein every 6 (six) hours as needed. Patient not taking: Reported on 03/06/2020 01/12/20   Donne Hazel, MD  Multiple Vitamin (MULTIVITAMIN) tablet 1 tablet by Per J Tube route daily.    [provider]  Nutritional Supplements (FEEDING SUPPLEMENT, JEVITY 1.5 CAL/FIBER,) LIQD 1,000 mLs by Per J Tube route continuous. Goal rate 50 cc/hr, reduce by 10 cc/hr as needed if nausea occurs. 03/13/20   Ezekiel Slocumb, DO  omeprazole (FIRST-OMEPRAZOLE) 2 mg/mL SUSP oral suspension Place 10 mg into feeding tube daily.    [provider]  Pancrelipase, Lip-Prot-Amyl, [phone] units TABS 2 tablets by Per J Tube route 3 (three) times daily with meals.    [provider]  Water For Irrigation, Sterile (FREE WATER) SOLN Place 30 mLs into feeding tube every 4 (four) hours. 03/13/20   Ezekiel Slocumb, DO   Allergies  Allergen Reactions  . Azithromycin Itching    Okay if takes benadryl along with it  . Nickel     Reaction to cheap earrings  . Sulfa Antibiotics     Itching.   [name] [Acetic Acid]     Nausea.   . Adhesive [Tape] Rash    Paper tape - blisters    FAMILY HISTORY:  family history includes Cancer in her father and paternal aunt; Coronary artery disease in her maternal grandfather and maternal grandmother; Coronary artery disease (age of onset: 81) in her father; Diabetes in her maternal grandfather; Hyperlipidemia in her father and mother; Hypertension in her father and mother; Stroke in her  mother. SOCIAL HISTORY:  reports that she has never smoked. She has never used smokeless tobacco. She reports previous alcohol use of about 1.0 - 2.0 standard drink of alcohol per week. She reports that she does not use drugs.   REVIEW OF SYSTEMS:   Constitutional: Negative for fever, chills, weight loss, and diaphoresis. + malaise/fatigue.  HENT: Negative for hearing loss, ear pain, nosebleeds, congestion, sore throat,  neck pain, tinnitus and ear discharge.   Eyes: Negative for blurred vision, double vision, photophobia, pain, discharge and redness.  Respiratory: Negative for cough, hemoptysis, sputum production, wheezing and stridor. +shortness of breath with exertion. Cardiovascular: Negative for chest pain, palpitations, orthopnea, claudication, leg swelling and PND.  Gastrointestinal: Negative for heartburn, nausea, vomiting, abdominal pain, diarrhea, constipation, blood in stool and melena.  Genitourinary: Negative for dysuria, urgency, frequency, hematuria and flank pain.  Musculoskeletal: Negative for myalgias, back pain, joint pain and falls.  Skin: Negative for itching and rash.  Neurological: positive  for dizziness. Negative for tingling, tremors, sensory change, speech change, focal weakness, seizures, loss of consciousness, weakness and headaches.  Endo/Heme/Allergies: Negative for environmental allergies and polydipsia. Does not bruise/bleed easily.   PHYSICAL EXAMINATION:  VITAL SIGNS: Temp:  [97.8 F (36.6 C)-98.7 F (37.1 C)] 97.9 F (36.6 C) (06/20 1626) Pulse Rate:  [108-121] 108 (06/20 1626) Resp:  [16-28] 27 (06/20 1626) BP: (107-128)/(50-76) 128/76 (06/20 1626) SpO2:  [94 %-99 %] 94 % (06/20 1500) Weight:  [09 kg] 63 kg (06/20 1108)    GENERAL: 74 year old patient lying in the bed with no acute distress. Pale looking EYES: Pupils equal, round, reactive to light and accommodation. No scleral icterus. Extraocular muscles intact.  HEENT: Head atraumatic, normocephalic. Oropharynx and nasopharynx clear.  NECK:  Supple, no jugular venous distention. No thyroid enlargement, no tenderness.  LUNGS: Normal breath sounds bilaterally, no wheezing, rales,rhonchi or crepitation. No use of accessory muscles of respiration.  CARDIOVASCULAR: S1, S2 normal. Murmurs present, rubs, or gallops.  ABDOMEN: Soft, nontender, nondistended. Bowel sounds present. No organomegaly or mass.  EXTREMITIES:  No pedal edema, cyanosis, or clubbing.  NEUROLOGIC: Cranial nerves II through XII are intact. Muscle strength 5/5 in all extremities. Sensation intact. Gait not checked.  PSYCHIATRIC: The patient is alert and oriented x 3.  SKIN: No obvious rash, lesion, or ulcer.   Recent Labs  Lab 04/22/20 1141 04/22/20 1656  NA 129* 133*  K 4.2 4.1  CL 97* 104  CO2 16* 19*  BUN 31* 25*  CREATININE 1.05* 0.93  GLUCOSE 502* 420*   Recent Labs  Lab 04/22/20 1141  HGB 4.4*  HCT 13.6*  WBC 9.7  PLT 188   [place] EKG SITE RITE  Result Date: 04/22/2020 If Site Rite image not attached, placement could not be confirmed due to current cardiac rhythm.   ASSESSMENT / PLAN:  Acute GI Bleed - Hgb 4.4g/dl on admission. S/p 2u PRBCs (06/20) Acute on chronic blood loss anemia Hx: S/p EGD 3/6 showing clotted blood within the gastric fundus and body/ S/p IR Angio and embolization of bleeding GDA on 01/28/20 at [place], Stress induced gastric ulcers , Iron Deficiency Anemia - IVF resuscitation to maintain MAP>65 - H&H monitoring q6h - Blood Consent.  Transfuse PRN Hgb<8 - Pantoprazole 80mg  IV x1 then gtt 8mg /hr - NG lavage if needed - NPO for pending endoscopy - Monitor for massive lower GI bleed, CT angio pending to isolate source and with possible consideration for  IR Consult for embolization) - Helicobacter pylori negative -  Hold NSAIDs, steroids, ASA - GI Consult for EGD/Colonoscopy  Diabetic Ketoacidosis - BG 504 on admission, anion gap acidosis Hx: IDDM - Check Lipase and HgbA1c - Insulin drip, DKA protocol (requires NPO) - Isotonic (NS,LR) bolus 1L / hr until euvolemic (expect extensive volume depletion, could require 5L+) - Foley catheter for high urine output monitoring if indicated - Central line for repeated labs - Treat any underling causes - Glucose: q1h to titrate insulin - Lab monitoring: q2-4h BMP+Phosphorus+pH (ABG/VBG)  - Consult Diabetes co-ordinator   Hyponatremia -likely  pseudohyponatremia - Check cortisol, TSH, serum osmolality, sodium osmolality - Hold  diurectics - Start IVFs with NS with goal serum sodium level not > 10 to 12 mEq per L in the first 24 hours and 18 mEq per L in the first 48 hours - Check serial sodium   Acute Kidney Injury - Likely prerenal in the setting of severe blood loss Anemia - Monitor I&O's / urinary output - Follow BMP - Ensure adequate renal perfusion - Avoid nephrotoxic agents as able - Replace electrolytes as indicated  Afib with RVR  - Continue metoprolol - Holding anticoagulation in the setting of GI bleed     [facility] DISPOSITION: Stepdown GOALS OF CARE: Full Code VTE PROPHYLAXIS: SCDs Holding anticoagulation for GI bleed STRESS ULCER PROPHYLAXIS: IV Protonix   Rufina Falco. DNP, CCRN, FNP-BC [place] [facility]   04/22/2020, 5:18 PM

## 2020-04-22 NOTE — Progress Notes (Signed)
Patient arrived to floor on insulin gtt. Insulin drip continued at this time per endotool recommendations. Rate change at 2145 to 2.75mL/hour, and again at 2245 to 1.55mL/hr.   Elesa Hacker, RN

## 2020-04-22 NOTE — ED Provider Notes (Signed)
[facility] Emergency Department Provider Note   ____________________________________________   First MD Initiated Contact with Patient 04/22/20 1112     (approximate)  I have reviewed the triage vital signs and the nursing notes.   HISTORY  Chief Complaint abnormal labs and Fatigue    HPI Katherine Oliver is a 74 y.o. 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  Patient reports that she has noticed dark brown stools for about the past 5 days, has also dealt with nausea and vomiting of dark coffee-ground-like material intermittently over the past 1 to 2 weeks.  She had outpatient labs performed today showing a hemoglobin of 4.5 and was subsequently advised to come to the ER for further evaluation.  She denies any abdominal pain, has been taking increased amount of food by mouth including some of her medications, but continues to take some medications via jejunostomy.  Notably, patient did have bleeding from her gastroduodenal artery requiring embolization during prolonged admission at [facility] in March and April.  She denies any NSAID or steroid use, does not drink alcohol.  She continues to take Lovenox for anticoagulation.  She has been feeling very lightheaded, weak, with some difficulty breathing lately.        Past Medical History:  Diagnosis Date  . Allergic rhinitis   . Arthritis   . 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 nephropathy ([place])    DSME at [facility] 01/2016   . Frequent epistaxis 05/16/2019   S/p cauterization with resolution 2020  . GERD (gastroesophageal reflux disease)   . Hepatic steatosis    by abd [place] 05/2012, mild transaminitis - normal iron  sat and viral hep panel (2011), stable [place] 2017  . History of chicken pox   . History of measles   . History of recurrent UTIs    on chronic keflex  . HLD (hyperlipidemia)   . HTN (hypertension)   . 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 Kidney cyst, acquired 01/2016   L kidney by [place]  . Kidney stone 01/2016   L kidney by [place]  . Lung nodules 11/2013   overall stable on f/u CT 01/2016  . Osteopenia 06/2013   mild, forearm T -1.1, hip and spine WNL  . Pancreatitis   . Polycythemia    mild, stable (2013)  . Primary localized osteoarthritis of right knee 01/05/2019  . Rosacea    metrogel    Patient Active Problem List   Diagnosis Date Noted  . Acute upper GI bleed 04/22/2020  . UTI (urinary tract infection) 03/07/2020  . Biliary drain displacement 03/07/2020  . Gastrostomy in place [facility]) 03/07/2020  . Chronic pancreatitis ([place]) 01/12/2020  . Acute pancreatitis 01/07/2020  . Ovarian mass, right 01/06/2020  . Acquired renal cyst of left kidney 01/06/2020  . Gastric stress ulcer 01/06/2020  . Acute necrotizing pancreatitis   . AF (paroxysmal atrial fibrillation) ([place])   . Anemia   . AKI (acute kidney injury) ([place]) 12/15/2019  . Choledocholithiasis   . Acute gallstone pancreatitis 12/13/2019  . Primary localized osteoarthritis of right knee 01/05/2019  . 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 Hx of adenomatous polyp of colon  12/15/2017  . Vulvar dermatitis 12/07/2017  . Stress due to illness of family member 08/18/2017  . Left sided abdominal pain 02/05/2017  . Right hip pain 08/07/2016  . CKD stage 3 due to type 2 diabetes mellitus ([place]) 04/04/2016  . Trigger ring finger of right hand 04/04/2016  . Pulmonary nodules 01/03/2016  . Advanced care planning/counseling discussion 07/05/2015  . Obesity, Class I, BMI 30-34.9 07/05/2015  . 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 Osteopenia 06/03/2013  . Medicare annual  wellness visit, subsequent 05/31/2012  . Rotator cuff tendonitis, right 03/01/2012  . Polycythemia 12/02/2011  . Diabetes mellitus type 2, uncontrolled, with complications ([place])   . HTN (hypertension)   . Dyslipidemia associated with type 2 diabetes mellitus ([place])   . History of recurrent UTIs   . Allergic rhinitis   . GERD (gastroesophageal reflux disease)   . Rosacea   . NAFLD (nonalcoholic fatty liver disease) 06/03/2010    Past Surgical History:  Procedure Laterality Date  . APPENDECTOMY  1987  . BILIARY STENT PLACEMENT  12/14/2019   Procedure: BILIARY STENT PLACEMENT;  Surgeon: [name], MD;  Location: [place];  Service: Endoscopy;;  . BREAST BIOPSY Right 1963   benign  . BREAST BIOPSY Right 08/2014   benign- core  . cardiolite stress test  04/2004   normal  . CESAREAN SECTION  1027;2536   x2  . CHOLECYSTECTOMY  2003  . COLONOSCOPY  09/26/2008   adenomatous polyp, rpt 3-5 yrs  . COLONOSCOPY  08/2012   adenomatous polyps, diverticulosis, rec rpt 5 yrs [name])  . COLONOSCOPY WITH PROPOFOL N/A 02/05/2018   4TA, SSA, diverticulosis, rpt 3 yrs [name], [name], MD)  . dexa  2003   normal  . dexa  06/2013   [facility] - Tscore -1.1 forearm, normal spine and femur  . ERCP N/A 12/14/2019   Procedure: ENDOSCOPIC RETROGRADE CHOLANGIOPANCREATOGRAPHY (ERCP);  Surgeon: [name], MD;  Location: [place];  Service: Endoscopy;  Laterality: N/A;  . ERCP  01/2020   nonbleeding gastric ulcer - [facility] hospitalization (Dr Mont Dutton)  . ESOPHAGOGASTRODUODENOSCOPY N/A 12/19/2019   Procedure: ESOPHAGOGASTRODUODENOSCOPY (EGD);  Surgeon: [name], MD;  Location: [facility] ENDOSCOPY;  Service: Endoscopy;  Laterality: N/A;  . ESOPHAGOGASTRODUODENOSCOPY  01/2020   pre existing AXIOS cystogastrostomy stent s/p necrosectomy - [facility] hospitalization (Dr Mont Dutton)  . IR FLUORO GUIDE CV LINE RIGHT  12/27/2019  . IR REMOVAL TUN CV CATH W/O FL  01/03/2020  . IR REPLC DUODEN/JEJUNO TUBE PERCUT  W/FLUORO  04/01/2020  . SPHINCTEROTOMY  12/14/2019   Procedure: SPHINCTEROTOMY;  Surgeon: [name], MD;  Location: [place];  Service: Endoscopy;;  . 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 TRANSTHORACIC ECHOCARDIOGRAM  04/2019   EF 55-60%, modLVH, impaired relaxation   . TRIGGER FINGER RELEASE  2007;2010;2011   bilateral  . TRIGGER FINGER RELEASE  02/2017  . VAGINAL HYSTERECTOMY  1984   for menorrhagia, ovaries in place    Prior to Admission medications   Medication Sig Start Date End Date Taking? Authorizing Provider  ACCU-CHEK AVIVA PLUS test strip USE AS DIRECTED TO CHECK BLOOD SUGARS ONCE DAILY E11.21 04/17/20   [name], MD  acetaminophen (TYLENOL) 160 MG/5ML suspension Place 30.5 mg into feeding tube every 6 (six) hours as needed.    [provider]  amLODIPine Benzoate 1 MG/ML SUSP 2.5 mLs by Per J  Tube route daily in the afternoon.    [provider]  benazepril (LOTENSIN) 20 MG tablet 20 mg by Per J Tube route daily.    [provider]  conjugated estrogens (PREMARIN) vaginal cream Place 1 Applicatorful vaginally daily. Only to be applied topically 03/14/20   Nicole Kindred A, DO  DILTIAZEM HCL PO 7.5 mLs by Per J Tube route.    [provider]  enoxaparin (LOVENOX) 80 MG/0.8ML injection Inject 0.65 mLs (65 mg total) into the skin 2 (two) times daily. 03/13/20   Ezekiel Slocumb, DO  fluticasone (FLONASE) 50 MCG/ACT nasal spray Place 1 spray into both nostrils daily.    [provider]  insulin aspart (NOVOLOG) 100 UNIT/ML injection Inject 0-15 Units into the skin every 4 (four) hours. 03/13/20   Ezekiel Slocumb, DO  meclizine (ANTIVERT) 25 MG tablet Take 1 tablet (25 mg total) by mouth 3 (three) times daily as needed for nausea. 03/13/20   Ezekiel Slocumb, DO  metoCLOPramide (REGLAN) 10  MG tablet Take 1 tablet (10 mg total) by mouth every 6 (six) hours. 03/13/20   Ezekiel Slocumb, DO  metoCLOPramide (REGLAN) 5 MG/ML injection Inject 2 mLs (10 mg total) into the vein every 6 (six) hours as needed. Patient not taking: Reported on 03/06/2020 01/12/20   Donne Hazel, MD  Multiple Vitamin (MULTIVITAMIN) tablet 1 tablet by Per J Tube route daily.    [provider]  Nutritional Supplements (FEEDING SUPPLEMENT, JEVITY 1.5 CAL/FIBER,) LIQD 1,000 mLs by Per J Tube route continuous. Goal rate 50 cc/hr, reduce by 10 cc/hr as needed if nausea occurs. 03/13/20   Ezekiel Slocumb, DO  omeprazole (FIRST-OMEPRAZOLE) 2 mg/mL SUSP oral suspension Place 10 mg into feeding tube daily.    [provider]  Pancrelipase, Lip-Prot-Amyl, [phone] units TABS 2 tablets by Per J Tube route 3 (three) times daily with meals.    [provider]  Water For Irrigation, Sterile (FREE WATER) SOLN Place 30 mLs into feeding tube every 4 (four) hours. 03/13/20   Ezekiel Slocumb, DO    Allergies Azithromycin, Nickel, Sulfa antibiotics, Vinegar [acetic acid], and Adhesive [tape]  Family History  Problem Relation Age of Onset  . Stroke Mother        several  . Hyperlipidemia Mother   . Hypertension Mother   . Cancer Father        colon  . Hypertension Father   . Hyperlipidemia Father   . Coronary artery disease Father 81       MIx1, CABG  . Cancer Paternal Aunt        abdominal  . Coronary artery disease Maternal Grandmother   . Diabetes Maternal Grandfather   . Coronary artery disease Maternal Grandfather   . Breast cancer Neg Hx     Social History Social History   Tobacco Use  . Smoking status: Never Smoker  . Smokeless tobacco: Never Used  Vaping Use  . Vaping Use: Never used  Substance Use Topics  . Alcohol use: Not Currently    Alcohol/week: 1.0 - 2.0 standard drink    Types: 1 - 2 Glasses of wine per week    Comment: Occasional/1 mixed drink per month  .  Drug use: No    Review of Systems  Constitutional: No fever/chills.  Positive for lightheadedness and weakness. Eyes: No visual changes. ENT: No sore throat. Cardiovascular: Denies chest pain. Respiratory: Positive for shortness of breath. Gastrointestinal: No abdominal pain.  No  nausea, no vomiting.  No diarrhea.  No constipation.  Positive for bloody stool. Genitourinary: Negative for dysuria. Musculoskeletal: Negative for back pain. Skin: Negative for rash. Neurological: Negative for headaches, focal weakness or numbness.  ____________________________________________   PHYSICAL EXAM:  VITAL SIGNS: ED Triage Vitals  Enc Vitals Group     BP 04/22/20 1110 125/67     Pulse Rate 04/22/20 1110 (!) 121     Resp 04/22/20 1110 16     Temp 04/22/20 1110 98.7 F (37.1 C)     Temp Source 04/22/20 1110 Oral     SpO2 04/22/20 1110 99 %     Weight 04/22/20 1108 138 lb 14.2 oz (63 kg)     Height 04/22/20 1108 5' (1.524 m)     Head Circumference --      Peak Flow --      Pain Score 04/22/20 1108 0     Pain Loc --      Pain Edu? --      Excl. in [place]? --     Constitutional: Alert and oriented. Eyes: Conjunctivae are normal. Head: Atraumatic. Nose: No congestion/rhinnorhea. Mouth/Throat: Mucous membranes are moist. Neck: Normal ROM Cardiovascular: Normal rate, regular rhythm. Grossly normal heart sounds. Respiratory: Normal respiratory effort.  No retractions. Lungs CTAB. Gastrointestinal: Soft and nontender. No distention.  Jejunostomy tube in place.  Dark brown guaiac positive stool on rectal exam. Genitourinary: deferred Musculoskeletal: No lower extremity tenderness nor edema. Neurologic:  Normal speech and language. No gross focal neurologic deficits are appreciated. Skin:  Skin is warm, dry and intact. No rash noted. Psychiatric: Mood and affect are normal. Speech and behavior are normal.  ____________________________________________   LABS (all labs ordered are listed,  but only abnormal results are displayed)  Labs Reviewed  CBC WITH DIFFERENTIAL/PLATELET - Abnormal; Notable for the following components:      Result Value   RBC 1.57 (*)    Hemoglobin 4.4 (*)    HCT 13.6 (*)    RDW 18.5 (*)    nRBC 1.0 (*)    Neutro Abs 8.0 (*)    Abs Immature Granulocytes 0.45 (*)    All other components within normal limits  COMPREHENSIVE METABOLIC PANEL - Abnormal; Notable for the following components:   Sodium 129 (*)    Chloride 97 (*)    CO2 16 (*)    Glucose, Bld 502 (*)    BUN 31 (*)    Creatinine, Ser 1.05 (*)    Total Protein 5.9 (*)    Albumin 3.2 (*)    AST 11 (*)    GFR calc non Af Amer 52 (*)    Anion gap 16 (*)    All other components within normal limits  GLUCOSE, CAPILLARY - Abnormal; Notable for the following components:   Glucose-Capillary 460 (*)    All other components within normal limits  SARS CORONAVIRUS 2 BY RT PCR (HOSPITAL ORDER, [place] [facility])  PROTIME-INR  APTT  LIPASE, BLOOD  TYPE AND SCREEN  PREPARE RBC (CROSSMATCH)  ABO/RH     PROCEDURES  Procedure(s) performed (including Critical Care):  .Critical Care Performed by: Blake Divine, MD Authorized by: Blake Divine, MD   Critical care provider statement:    Critical care time (minutes):  45   Critical care time was exclusive of:  Separately billable procedures and treating other patients and teaching time   Critical care was necessary to treat or prevent imminent or life-threatening deterioration of the following  conditions:  Circulatory failure   Critical care was time spent personally by me on the following activities:  Discussions with consultants, evaluation of patient's response to treatment, examination of patient, ordering and performing treatments and interventions, ordering and review of laboratory studies, ordering and review of radiographic studies, pulse oximetry, re-evaluation of patient's condition, obtaining history from  patient or surrogate and review of old charts   I assumed direction of critical care for this patient from another provider in my specialty: no   .1-3 Lead EKG Interpretation Performed by: Blake Divine, MD Authorized by: Blake Divine, MD     Interpretation: normal     ECG rate:  120   ECG rate assessment: tachycardic     Rhythm: sinus tachycardia     Ectopy: none     Conduction: normal       ____________________________________________   INITIAL IMPRESSION / ASSESSMENT AND PLAN / ED COURSE       74 year old female with history of necrotizing pancreatitis requiring prolonged admission at [facility] for VARD as well as jejunostomy presents to the ED for low hemoglobin noted on outpatient labs, reports dark stools for the past week and intermittent coffee-ground emesis for the past 2 weeks.  She is tachycardic but with stable blood pressure currently, her PCP called the ED to confirm that her hemoglobin was 4.5 and we will initiate transfusion of 2 units PRBCs.  Given her history of bleeding GDA, we will further assess with CT angiogram of abdomen/pelvis for acute arterial bleed, although this seems less likely given prolonged course of her dark stools.  Case discussed with Dr. Alice Reichert of GI, who agrees with plan for Protonix drip and CT angiogram.  They will plan for endoscopy tomorrow.  Unfortunately, patient has had very difficult IV access, IV was initially placed by IV team, however this was found to be infiltrated when CTA attempted.  IV team now suggesting that patient receive PICC line and that she would not be a good candidate for midline.  PICC line order placed and case discussed with hospitalist for admission.  Her CTA is pending until she has better access, however she does have an IV in her hand that is appropriate for blood transfusion.  She remains hemodynamically stable at this time.      ____________________________________________   FINAL CLINICAL IMPRESSION(S) / ED  DIAGNOSES  Final diagnoses:  Upper GI bleed     ED Discharge Orders    None       Note:  This document was prepared using Dragon voice recognition software and may include unintentional dictation errors.   Blake Divine, MD 04/22/20 [phone]

## 2020-04-22 NOTE — H&P (Signed)
History and Physical    Katherine Oliver CVE:[id number] DOB: 12/26/1945 DOA: 04/22/2020  PCP: [name], MD  Patient coming from: Patient was transferred from [facility].  Chief Complaint: Low hemoglobin.  HPI: Katherine Oliver is a 74 y.o. female with history of diabetes mellitus type 2 hypertension A. fib DVT recently admitted from December 13, 2019 up to February 24, 2020 for acute pancreatitis and complications.  Patient was initially admitted on December 13, 2019 for presumed gallstone pancreatitis patient had undergone ERCP which showed biliary sludge with successful placement of biliary stent.  However patient developed acute renal failure requiring CRRT.  Patient also developed A. fib with RVR.  Since patient started having worsening abdominal pain nausea and distention with evidence of new peripancreatic fluid collection despite CBD stent patient was transferred to [facility] for further care where patient underwent cystogastrostomy placement and double pigtail stents endoscopic necrosectomy and percutaneous drainage of the cyst.  During the hospitalization patient also developed gastroduodenal artery bleeding and required embolization.  Patient also was started on Lovenox for DVT.  Patient was discharged to [facility] on February 24, 2020.  With drains in place.  Patient was discharged from the SNF on April 19, 2020 about 4 days ago and the home health nurse noticed that patient has been pale looking.  Patient states she also has been having some nausea vomiting with dark stools for the last 2 weeks.  Patient has a gastrostomy tube which only patient states she takes certain medications but most of the feeds he is taking orally for the last 2 weeks.  Denies taking any NSAIDs.  Patient had followed up with her PCP and labs showed hemoglobin to be around 4 was referred to the ER.  ED Course: In the ER patient's labs showed hemoglobin of  around 4.4 and complete metabolic panel showed glucose of 502 with anion gap of 16 bicarb of 16.  Patient was started on IV insulin infusion for DKA 2 units of PRBC transfusion was ordered for acute GI bleed and patient was transferred to [facility] for the need for possible IR embolization if needed.  On exam patient abdomen appears benign.  Denies chest pain or shortness of breath.  Review of Systems: As per HPI, rest all negative.   Past Medical History:  Diagnosis Date  . Allergic rhinitis   . Arthritis   . 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 nephropathy ([place])    DSME at [facility] 01/2016   . Frequent epistaxis 05/16/2019   S/p cauterization with resolution 2020  . GERD (gastroesophageal reflux disease)   . Hepatic steatosis    by abd [place] 05/2012, mild transaminitis - normal iron sat and viral hep panel (2011), stable [place] 2017  . History of chicken pox   . History of measles   . History of recurrent UTIs    on chronic keflex  . HLD (hyperlipidemia)   . HTN (hypertension)   . 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 Kidney cyst, acquired 01/2016   L kidney by [place]  . Kidney stone 01/2016   L kidney by [place]  . Lung nodules 11/2013   overall stable on f/u CT 01/2016  . Osteopenia 06/2013   mild, forearm T -1.1, hip and spine WNL  .  Pancreatitis   . Polycythemia    mild, stable (2013)  . Primary localized osteoarthritis of right knee 01/05/2019  . Rosacea    metrogel    Past Surgical History:  Procedure Laterality Date  . APPENDECTOMY  1987  . BILIARY STENT PLACEMENT  12/14/2019   Procedure: BILIARY STENT PLACEMENT;  Surgeon: [name], MD;  Location: [place];  Service: Endoscopy;;  . BREAST BIOPSY Right 1963   benign  . BREAST BIOPSY Right 08/2014   benign- core  . cardiolite stress test  04/2004   normal  .  CESAREAN SECTION  [id number]   x2  . CHOLECYSTECTOMY  2003  . COLONOSCOPY  09/26/2008   adenomatous polyp, rpt 3-5 yrs  . COLONOSCOPY  08/2012   adenomatous polyps, diverticulosis, rec rpt 5 yrs [name])  . COLONOSCOPY WITH PROPOFOL N/A 02/05/2018   4TA, SSA, diverticulosis, rpt 3 yrs [name], [name], MD)  . dexa  2003   normal  . dexa  06/2013   [facility] - Tscore -1.1 forearm, normal spine and femur  . ERCP N/A 12/14/2019   Procedure: ENDOSCOPIC RETROGRADE CHOLANGIOPANCREATOGRAPHY (ERCP);  Surgeon: [name], MD;  Location: [place];  Service: Endoscopy;  Laterality: N/A;  . ERCP  01/2020   nonbleeding gastric ulcer - [facility] hospitalization (Dr Mont Dutton)  . ESOPHAGOGASTRODUODENOSCOPY N/A 12/19/2019   Procedure: ESOPHAGOGASTRODUODENOSCOPY (EGD);  Surgeon: [name], MD;  Location: [facility] ENDOSCOPY;  Service: Endoscopy;  Laterality: N/A;  . ESOPHAGOGASTRODUODENOSCOPY  01/2020   pre existing AXIOS cystogastrostomy stent s/p necrosectomy - [facility] hospitalization (Dr Mont Dutton)  . IR FLUORO GUIDE CV LINE RIGHT  12/27/2019  . IR REMOVAL TUN CV CATH W/O FL  01/03/2020  . IR REPLC DUODEN/JEJUNO TUBE PERCUT W/FLUORO  04/01/2020  . SPHINCTEROTOMY  12/14/2019   Procedure: SPHINCTEROTOMY;  Surgeon: [name], MD;  Location: [place];  Service: Endoscopy;;  . 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 TRANSTHORACIC ECHOCARDIOGRAM  04/2019   EF 55-60%, modLVH, impaired relaxation   . TRIGGER FINGER RELEASE  2007;2010;2011   bilateral  . TRIGGER FINGER RELEASE  02/2017  . VAGINAL HYSTERECTOMY  1984   for menorrhagia, ovaries in place     reports that she has never smoked. She has never used smokeless tobacco. She reports previous alcohol use of about 1.0 - 2.0 standard drink of alcohol per week. She reports that she does not use drugs.  Allergies  Allergen  Reactions  . Azithromycin Itching    Okay if takes benadryl along with it  . Nickel     Reaction to cheap earrings  . Sulfa Antibiotics     Itching.   [name] [Acetic Acid]     Nausea.   . Adhesive [Tape] Rash    Paper tape - blisters    Family History  Problem Relation Age of Onset  . Stroke Mother        several  . Hyperlipidemia Mother   . Hypertension Mother   . Cancer Father        colon  . Hypertension Father   . Hyperlipidemia Father   . Coronary artery disease Father 66       MIx1, CABG  . Cancer Paternal Aunt        abdominal  . Coronary artery disease Maternal Grandmother   . Diabetes Maternal Grandfather   . Coronary artery disease Maternal Grandfather   .  Breast cancer Neg Hx     Prior to Admission medications   Medication Sig Start Date End Date Taking? Authorizing Provider  ACCU-CHEK AVIVA PLUS test strip USE AS DIRECTED TO CHECK BLOOD SUGARS ONCE DAILY E11.21 04/17/20   [name], MD  acetaminophen (TYLENOL) 160 MG/5ML suspension Place 30.5 mg into feeding tube every 6 (six) hours as needed for mild pain or moderate pain.     [provider]  amLODIPine Benzoate 1 MG/ML SUSP 2.5 mLs by Per J Tube route daily in the afternoon.    [provider]  benazepril (LOTENSIN) 20 MG tablet 20 mg by Per J Tube route daily.    [provider]  conjugated estrogens (PREMARIN) vaginal cream Place 1 Applicatorful vaginally daily. Only to be applied topically 03/14/20   Nicole Kindred A, DO  enoxaparin (LOVENOX) 80 MG/0.8ML injection Inject 0.65 mLs (65 mg total) into the skin 2 (two) times daily. 03/13/20   Nicole Kindred A, DO  insulin lispro (HUMALOG) 100 UNIT/ML injection Inject 4-12 Units into the skin 3 (three) times daily before meals. (according to sliding scale - max 36u daily)    [provider]  LANTUS SOLOSTAR 100 UNIT/ML Solostar Pen Inject 12 Units into the skin at bedtime. 04/17/20   [provider]    meclizine (ANTIVERT) 25 MG tablet Take 1 tablet (25 mg total) by mouth 3 (three) times daily as needed for nausea. 03/13/20   Ezekiel Slocumb, DO  metoCLOPramide (REGLAN) 10 MG tablet Take 1 tablet (10 mg total) by mouth every 6 (six) hours. 03/13/20   Ezekiel Slocumb, DO  Multiple Vitamin (MULTIVITAMIN) tablet 1 tablet by Per J Tube route daily.    [provider]  Nutritional Supplements (FEEDING SUPPLEMENT, JEVITY 1.5 CAL/FIBER,) LIQD 1,000 mLs by Per J Tube route continuous. Goal rate 50 cc/hr, reduce by 10 cc/hr as needed if nausea occurs. 03/13/20   Nicole Kindred A, DO  oxyCODONE (OXY IR/ROXICODONE) 5 MG immediate release tablet Take 5 mg by mouth 3 (three) times daily as needed for moderate pain or severe pain. 04/19/20   [provider]  Pancrelipase, Lip-Prot-Amyl, [phone] units TABS 2 tablets by Per J Tube route 3 (three) times daily with meals.    [provider]  Water For Irrigation, Sterile (FREE WATER) SOLN Place 30 mLs into feeding tube every 4 (four) hours. 03/13/20   Ezekiel Slocumb, DO    Physical Exam: Constitutional: Moderately built and nourished. There were no vitals filed for this visit.  Blood pressure is 130/60 pulse is 120/min sinus tach he temperature 97.8 respiration 18/min on room air. Eyes: Anicteric no pallor. ENMT: No discharge from the ears eyes nose or mouth. Neck: No mass felt.  No neck rigidity. Respiratory: No rhonchi or crepitations. Cardiovascular: S1-S2 heard. Abdomen: Gastrostomy tube seen soft nontender bowel sounds present. Musculoskeletal: No edema. Skin: No rash. Neurologic: Alert awake oriented to time place and person.  Moves all extremities. Psychiatric: Appears normal but normal affect.   Labs on Admission: I have personally reviewed following labs and imaging studies  CBC: Recent Labs  Lab 04/22/20 1141  WBC 9.7  NEUTROABS 8.0*  HGB 4.4*  HCT 13.6*  MCV 86.6  PLT 619   Basic Metabolic  Panel: Recent Labs  Lab 04/22/20 1141 04/22/20 1656  NA 129* 133*  K 4.2 4.1  CL 97* 104  CO2 16* 19*  GLUCOSE 502* 420*  BUN 31* 25*  CREATININE 1.05* 0.93  CALCIUM  9.4 8.8*   GFR: Estimated Creatinine Clearance: 43.3 mL/min (by C-G formula based on SCr of 0.93 mg/dL). Liver Function Tests: Recent Labs  Lab 04/22/20 1141  AST 11*  ALT 10  ALKPHOS 71  BILITOT 1.1  PROT 5.9*  ALBUMIN 3.2*   Recent Labs  Lab 04/22/20 1142  LIPASE 38   No results for input(s): AMMONIA in the last 168 hours. Coagulation Profile: Recent Labs  Lab 04/22/20 1141  INR 1.2   Cardiac Enzymes: No results for input(s): CKTOTAL, CKMB, CKMBINDEX, TROPONINI in the last 168 hours. BNP (last 3 results) No results for input(s): PROBNP in the last 8760 hours. HbA1C: No results for input(s): HGBA1C in the last 72 hours. CBG: Recent Labs  Lab 04/22/20 1127 04/22/20 1846 04/22/20 2016 04/22/20 2139 04/22/20 2243  GLUCAP 460* 427* 291* 184* 131*   Lipid Profile: No results for input(s): CHOL, HDL, LDLCALC, TRIG, CHOLHDL, LDLDIRECT in the last 72 hours. Thyroid Function Tests: No results for input(s): TSH, T4TOTAL, FREET4, T3FREE, THYROIDAB in the last 72 hours. Anemia Panel: No results for input(s): VITAMINB12, FOLATE, FERRITIN, TIBC, IRON, RETICCTPCT in the last 72 hours. Urine analysis:    Component Value Date/Time   COLORURINE YELLOW (A) 03/06/2020 2303   APPEARANCEUR TURBID (A) 03/06/2020 2303   APPEARANCEUR Cloudy (A) 12/12/2019 1332   LABSPEC 1.021 03/06/2020 2303   PHURINE 5.0 03/06/2020 2303   GLUCOSEU NEGATIVE 03/06/2020 2303   HGBUR NEGATIVE 03/06/2020 2303   BILIRUBINUR NEGATIVE 03/06/2020 2303   BILIRUBINUR Negative 12/12/2019 1332   KETONESUR NEGATIVE 03/06/2020 2303   PROTEINUR 30 (A) 03/06/2020 2303   UROBILINOGEN 0.2 02/12/2018 0805   NITRITE NEGATIVE 03/06/2020 2303   LEUKOCYTESUR TRACE (A) 03/06/2020 2303   Sepsis  Labs: @LABRCNTIP (procalcitonin:4,lacticidven:4) ) Recent Results (from the past 240 hour(s))  SARS Coronavirus 2 by RT PCR (hospital order, performed in [place] hospital lab) Nasopharyngeal Nasopharyngeal Swab     Status: None   Collection Time: 04/22/20 12:02 PM   Specimen: Nasopharyngeal Swab  Result Value Ref Range Status   SARS Coronavirus 2 NEGATIVE NEGATIVE Final    Comment: (NOTE) SARS-CoV-2 target nucleic acids are NOT DETECTED.  The SARS-CoV-2 RNA is generally detectable in upper and lower respiratory specimens during the acute phase of infection. The lowest concentration of SARS-CoV-2 viral copies this assay can detect is 250 copies / mL. A negative result does not preclude SARS-CoV-2 infection and should not be used as the sole basis for treatment or other patient management decisions.  A negative result may occur with improper specimen collection / handling, submission of specimen other than nasopharyngeal swab, presence of viral mutation(s) within the areas targeted by this assay, and inadequate number of viral copies (<250 copies / mL). A negative result must be combined with clinical observations, patient history, and epidemiological information.  Fact Sheet for Patients:   StrictlyIdeas.no  Fact Sheet for Healthcare Providers: BankingDealers.co.za  This test is not yet approved or  cleared by the [place] FDA and has been authorized for detection and/or diagnosis of SARS-CoV-2 by FDA under an Emergency Use Authorization (EUA).  This EUA will remain in effect (meaning this test can be used) for the duration of the COVID-19 declaration under Section 564(b)(1) of the Act, 21 U.S.C. section 360bbb-3(b)(1), unless the authorization is terminated or revoked sooner.  Performed at [facility], [address] [place], [place] [address]      Radiological Exams on Admission: [place] EKG SITE RITE  Result  Date: 04/22/2020  If [employer] not attached, placement could not be confirmed due to current cardiac rhythm.   EKG: Independently reviewed.  Sinus tachycardia.  Assessment/Plan Principal Problem:   Acute GI bleeding Active Problems:   NAFLD (nonalcoholic fatty liver disease)   AF (paroxysmal atrial fibrillation) (HCC)   Chronic pancreatitis ([place])   Gastrostomy in place [facility])   Acute blood loss anemia   DKA (diabetic ketoacidoses) ([place])    1. Acute GI bleed -given the recent history of having gastroduodenal artery bleeding at [facility] requiring embolization patient is transferred to [facility] for possible IR consult if needed for embolization.  We will keep patient on Protonix IV 2 units of PRBC transfusion has been already transfused will check repeat CBC.  Keep patient n.p.o.  Consult GI and IR in the morning.  Presently hemodynamically stable.  Holding Lovenox. 2. DKA -patient states she usually takes basal insulin of 20 noticed which patient was not receiving in the SNF as per the patient.  Patient still is presently on IV insulin infusion for DKA protocol.  Closely follow metabolic panel. 3. Blood loss anemia secondary GI bleed follow CBC transfuse if hemoglobin less than 7. 4. History of paroxysmal atrial fibrillation and recent diagnosis of DVT holding Lovenox due to acute GI bleed with severe anemia.  For heart rate control keep patient on scheduled dose of metoprolol 2.5 IV every 6 with holding parameters.  May need IVC filter given the GI bleed. 5. Hypertension in addition to scheduled dose of metoprolol 12.5 IV every 6 we will keep patient as needed IV hydralazine. 6. Recent admission for acute necrotizing pancreatitis with complications as discussed in the HPI.  He with acute GI bleed with severe anemia with DKA will need close monitoring for any deterioration in inpatient status.   DVT prophylaxis: SCDs. Code Status: Full code. Family Communication: Discussed with  patient. Disposition Plan: To be determined. Consults called: We will consult IR and may need GI consult. Admission status: Inpatient.   Rise Patience MD [facility] Pager [phone].  If 7PM-7AM, please contact night-coverage [email or web address] Password [email or web address]  04/22/2020, 11:40 PM

## 2020-04-22 NOTE — ED Notes (Signed)
PICC procedure in progress at this time

## 2020-04-22 NOTE — ED Notes (Signed)
Pt signed blood transfusion consent, verbalizes understanding of treatment

## 2020-04-22 NOTE — Plan of Care (Addendum)
Discussed with Dr. Patsey Berthold, CCM, recommended transfer to [facility] as patient has recent history of arterial bleed, requiring embolization.  Patient has difficult anatomy, access difficulty, unable to get CTA at [facility] without access, IR services not available in case patient develops massive bleeding.  Full transfer note to follow for details  Discussed with [facility] and accepting physician Dr. [name] at [facility].   Stepdown bed    Katherine Oliver M.D. [facility] 04/22/2020, 7:38 PM

## 2020-04-22 NOTE — Telephone Encounter (Addendum)
Received stat lab report today - Hgb 4.5, Cr normal, BUN 37, GFR 58.  Advised pt go straight to ER today for further evaluation.  I see pt currently at [facility] ER - I spoke with Charge nurse to notify of abnormal labs for pt.

## 2020-04-23 ENCOUNTER — Telehealth: Payer: Self-pay

## 2020-04-23 ENCOUNTER — Inpatient Hospital Stay: Payer: Self-pay

## 2020-04-23 DIAGNOSIS — Z931 Gastrostomy status: Secondary | ICD-10-CM

## 2020-04-23 DIAGNOSIS — D500 Iron deficiency anemia secondary to blood loss (chronic): Secondary | ICD-10-CM

## 2020-04-23 DIAGNOSIS — E1110 Type 2 diabetes mellitus with ketoacidosis without coma: Secondary | ICD-10-CM

## 2020-04-23 DIAGNOSIS — K922 Gastrointestinal hemorrhage, unspecified: Secondary | ICD-10-CM

## 2020-04-23 DIAGNOSIS — I82409 Acute embolism and thrombosis of unspecified deep veins of unspecified lower extremity: Secondary | ICD-10-CM

## 2020-04-23 DIAGNOSIS — K8591 Acute pancreatitis with uninfected necrosis, unspecified: Secondary | ICD-10-CM

## 2020-04-23 DIAGNOSIS — I480 Paroxysmal atrial fibrillation: Secondary | ICD-10-CM

## 2020-04-23 DIAGNOSIS — Z794 Long term (current) use of insulin: Secondary | ICD-10-CM

## 2020-04-23 DIAGNOSIS — I10 Essential (primary) hypertension: Secondary | ICD-10-CM

## 2020-04-23 DIAGNOSIS — K760 Fatty (change of) liver, not elsewhere classified: Secondary | ICD-10-CM

## 2020-04-23 LAB — BPAM RBC
Blood Product Expiration Date: 202107132359
Blood Product Expiration Date: 202107222359
ISSUE DATE / TIME: 202106201425
ISSUE DATE / TIME: 202106201425
Unit Type and Rh: 7300
Unit Type and Rh: 7300

## 2020-04-23 LAB — TYPE AND SCREEN
ABO/RH(D): B POS
Antibody Screen: NEGATIVE
Unit division: 0
Unit division: 0

## 2020-04-23 LAB — GLUCOSE, CAPILLARY
Glucose-Capillary: 122 mg/dL — ABNORMAL HIGH (ref 70–99)
Glucose-Capillary: 130 mg/dL — ABNORMAL HIGH (ref 70–99)
Glucose-Capillary: 132 mg/dL — ABNORMAL HIGH (ref 70–99)
Glucose-Capillary: 145 mg/dL — ABNORMAL HIGH (ref 70–99)
Glucose-Capillary: 146 mg/dL — ABNORMAL HIGH (ref 70–99)
Glucose-Capillary: 149 mg/dL — ABNORMAL HIGH (ref 70–99)
Glucose-Capillary: 161 mg/dL — ABNORMAL HIGH (ref 70–99)
Glucose-Capillary: 174 mg/dL — ABNORMAL HIGH (ref 70–99)
Glucose-Capillary: 221 mg/dL — ABNORMAL HIGH (ref 70–99)
Glucose-Capillary: 239 mg/dL — ABNORMAL HIGH (ref 70–99)
Glucose-Capillary: 240 mg/dL — ABNORMAL HIGH (ref 70–99)

## 2020-04-23 LAB — CBC
HCT: 22.5 % — ABNORMAL LOW (ref 36.0–46.0)
HCT: 22.8 % — ABNORMAL LOW (ref 36.0–46.0)
HCT: 23.7 % — ABNORMAL LOW (ref 36.0–46.0)
Hemoglobin: 7.4 g/dL — ABNORMAL LOW (ref 12.0–15.0)
Hemoglobin: 7.5 g/dL — ABNORMAL LOW (ref 12.0–15.0)
Hemoglobin: 7.8 g/dL — ABNORMAL LOW (ref 12.0–15.0)
MCH: 29.2 pg (ref 26.0–34.0)
MCH: 29.5 pg (ref 26.0–34.0)
MCH: 30 pg (ref 26.0–34.0)
MCHC: 32.9 g/dL (ref 30.0–36.0)
MCHC: 32.9 g/dL (ref 30.0–36.0)
MCHC: 32.9 g/dL (ref 30.0–36.0)
MCV: 88.7 fL (ref 80.0–100.0)
MCV: 89.6 fL (ref 80.0–100.0)
MCV: 91.2 fL (ref 80.0–100.0)
Platelets: 170 10*3/uL (ref 150–400)
Platelets: 174 10*3/uL (ref 150–400)
Platelets: 176 10*3/uL (ref 150–400)
RBC: 2.51 MIL/uL — ABNORMAL LOW (ref 3.87–5.11)
RBC: 2.57 MIL/uL — ABNORMAL LOW (ref 3.87–5.11)
RBC: 2.6 MIL/uL — ABNORMAL LOW (ref 3.87–5.11)
RDW: 16 % — ABNORMAL HIGH (ref 11.5–15.5)
RDW: 16.1 % — ABNORMAL HIGH (ref 11.5–15.5)
RDW: 16.6 % — ABNORMAL HIGH (ref 11.5–15.5)
WBC: 8.9 10*3/uL (ref 4.0–10.5)
WBC: 9.4 10*3/uL (ref 4.0–10.5)
WBC: 9.7 10*3/uL (ref 4.0–10.5)
nRBC: 1.1 % — ABNORMAL HIGH (ref 0.0–0.2)
nRBC: 1.3 % — ABNORMAL HIGH (ref 0.0–0.2)
nRBC: 1.5 % — ABNORMAL HIGH (ref 0.0–0.2)

## 2020-04-23 LAB — BETA-HYDROXYBUTYRIC ACID: Beta-Hydroxybutyric Acid: 0.73 mmol/L — ABNORMAL HIGH (ref 0.05–0.27)

## 2020-04-23 LAB — BASIC METABOLIC PANEL
Anion gap: 10 (ref 5–15)
BUN: 21 mg/dL (ref 8–23)
CO2: 21 mmol/L — ABNORMAL LOW (ref 22–32)
Calcium: 9.4 mg/dL (ref 8.9–10.3)
Chloride: 108 mmol/L (ref 98–111)
Creatinine, Ser: 0.79 mg/dL (ref 0.44–1.00)
GFR calc Af Amer: >60 mL/min (ref >60)
GFR calc non Af Amer: >60 mL/min (ref >60)
Glucose, Bld: 140 mg/dL — ABNORMAL HIGH (ref 70–99)
Potassium: 4 mmol/L (ref 3.5–5.1)
Sodium: 139 mmol/L (ref 135–145)

## 2020-04-23 MED ORDER — INSULIN GLARGINE 100 UNIT/ML ~~LOC~~ SOLN: 8.0000 [IU] | Freq: Every day | SUBCUTANEOUS | Status: DC

## 2020-04-23 MED ORDER — POTASSIUM CHLORIDE IN NACL 20-0.9 MEQ/L-% IV SOLN: INTRAVENOUS | Status: DC

## 2020-04-23 MED ORDER — DEXTROSE-NACL 5-0.9 % IV SOLN: INTRAVENOUS | Status: DC

## 2020-04-23 MED ORDER — INSULIN ASPART 100 UNIT/ML ~~LOC~~ SOLN: 0.0000 [IU] | Freq: Three times a day (TID) | SUBCUTANEOUS | Status: DC

## 2020-04-23 MED ORDER — INSULIN ASPART 100 UNIT/ML ~~LOC~~ SOLN: 0.0000 [IU] | Freq: Every day | SUBCUTANEOUS | Status: DC

## 2020-04-23 MED ORDER — INSULIN ASPART 100 UNIT/ML ~~LOC~~ SOLN
0.0000 [IU] | SUBCUTANEOUS | Status: DC
  Administered 2020-04-23: 3 [IU] via SUBCUTANEOUS
  Administered 2020-04-23: 2 [IU] via SUBCUTANEOUS
  Administered 2020-04-23: 5 [IU] via SUBCUTANEOUS
  Administered 2020-04-23: 2 [IU] via SUBCUTANEOUS
  Administered 2020-04-23 – 2020-04-24 (×2): 5 [IU] via SUBCUTANEOUS
  Administered 2020-04-24 (×2): 3 [IU] via SUBCUTANEOUS
  Administered 2020-04-24: 5 [IU] via SUBCUTANEOUS
  Administered 2020-04-24 – 2020-04-25 (×4): 3 [IU] via SUBCUTANEOUS
  Administered 2020-04-26: 2 [IU] via SUBCUTANEOUS
  Administered 2020-04-26: 3 [IU] via SUBCUTANEOUS
  Administered 2020-04-26: 2 [IU] via SUBCUTANEOUS
  Administered 2020-04-27: 5 [IU] via SUBCUTANEOUS
  Administered 2020-04-27 (×2): 2 [IU] via SUBCUTANEOUS
  Administered 2020-04-28: 5 [IU] via SUBCUTANEOUS
  Administered 2020-04-28: 2 [IU] via SUBCUTANEOUS
  Administered 2020-04-28: 3 [IU] via SUBCUTANEOUS

## 2020-04-23 MED ORDER — INSULIN GLARGINE 100 UNIT/ML ~~LOC~~ SOLN
12.0000 [IU] | Freq: Every day | SUBCUTANEOUS | Status: DC
  Filled 2020-04-23: qty 0.12

## 2020-04-23 MED ORDER — INSULIN GLARGINE 100 UNIT/ML ~~LOC~~ SOLN
12.0000 [IU] | Freq: Every day | SUBCUTANEOUS | Status: DC
  Administered 2020-04-23 – 2020-04-28 (×6): 12 [IU] via SUBCUTANEOUS
  Filled 2020-04-23 (×6): qty 0.12

## 2020-04-23 MED ORDER — METOPROLOL TARTRATE 5 MG/5ML IV SOLN
2.5000 mg | Freq: Four times a day (QID) | INTRAVENOUS | Status: DC
  Administered 2020-04-23 – 2020-04-27 (×17): 2.5 mg via INTRAVENOUS
  Filled 2020-04-23 (×17): qty 5

## 2020-04-23 NOTE — Telephone Encounter (Signed)
Per pts chart review tab pt was seen in ED on 06/22/20 and was admitted.

## 2020-04-23 NOTE — Consult Note (Signed)
Referring Provider: [facility] Primary Care Physician:  [name], MD Primary Gastroenterologist: Althia Forts  Reason for Consultation:  Upper GI Bleeding  HPI: Katherine Oliver is a 74 y.o. female with an extensive past medical history noted below to include A. 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  Patient states that over the last 1 to 2 weeks, she has had intermittent coffee-ground emesis and melena.  This occurs every other day on average.  She denies any bright red emesis or bright red blood per rectum.  She does also note that there were some black liquid last week on one occasion when her PEG tube was flushed.  She has also been feeling weak and fatigued and has noted dyspnea on exertion.  She states she has not had any coffee-ground emesis or melena for the past 2 days.  Patient also notes some weight loss and decreased appetite since February/March when she had necrotizing pancreatitis.  She denies any current abdominal pain.  She denies dysphagia and heartburn.  She takes Lovenox for recent DVT (last dose 6/19).  She denies aspirin or NSAID use.  She denies alcohol use.  Patient has a personal history of tubular adenomas and her father has a history of colon cancer.  Last colonoscopy, per record review, was in 08/2017 and was pertinent for one sessile serrated adenoma.  Records reviewed: -EGD 01/18/2020 showed 1 small nonbleeding gastric ulcer, mild gastritis, and mild duodenitis without any bleeding. -Endoscopic necrosectomy 01/23/20 -EGD 12/19/19: Grade II distal esophagitis, multiple gastric ulcers, two larger ulcers with a flat pigmented spot (Forrest Class IIc), likely stress ulcers.  Patchy gastritis, likely from NGT. -12/14/19: ERCP with plastic stent placement (for CBD stones)  Past Medical History:  Diagnosis  Date  . Allergic rhinitis   . Arthritis   . 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 nephropathy ([place])    DSME at [facility] 01/2016   . Frequent epistaxis 05/16/2019   S/p cauterization with resolution 2020  . GERD (gastroesophageal reflux disease)   . Hepatic steatosis    by abd [place] 05/2012, mild transaminitis - normal iron sat and viral hep panel (2011), stable [place] 2017  . History of chicken pox   . History of measles   . History of recurrent UTIs    on chronic keflex  . HLD (hyperlipidemia)   . HTN (hypertension)   . 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 Kidney cyst, acquired 01/2016   L kidney by [place]  . Kidney stone 01/2016   L kidney by [place]  . Lung nodules 11/2013   overall stable on f/u CT 01/2016  . Osteopenia 06/2013   mild, forearm T -1.1, hip and spine WNL  . Pancreatitis   . Polycythemia    mild, stable (2013)  . Primary localized osteoarthritis of right knee 01/05/2019  . Rosacea    metrogel    Past Surgical History:  Procedure Laterality Date  . APPENDECTOMY  1987  . BILIARY STENT PLACEMENT  12/14/2019   Procedure: BILIARY STENT PLACEMENT;  Surgeon: [name], MD;  Location: [facility];  Service: Endoscopy;;  . BREAST BIOPSY Right 1963   benign  . BREAST BIOPSY Right 08/2014   benign- core  . cardiolite stress  test  04/2004   normal  . CESAREAN SECTION  [id number]   x2  . CHOLECYSTECTOMY  2003  . COLONOSCOPY  09/26/2008   adenomatous polyp, rpt 3-5 yrs  . COLONOSCOPY  08/2012   adenomatous polyps, diverticulosis, rec rpt 5 yrs [name])  . COLONOSCOPY WITH PROPOFOL N/A 02/05/2018   4TA, SSA, diverticulosis, rpt 3 yrs [name], [name], MD)  . dexa  2003   normal  . dexa  06/2013   [facility] - Tscore -1.1 forearm, normal spine and femur  . ERCP N/A 12/14/2019   Procedure: ENDOSCOPIC RETROGRADE  CHOLANGIOPANCREATOGRAPHY (ERCP);  Surgeon: [name], MD;  Location: [facility];  Service: Endoscopy;  Laterality: N/A;  . ERCP  01/2020   nonbleeding gastric ulcer - [facility] hospitalization (Dr Mont Dutton)  . ESOPHAGOGASTRODUODENOSCOPY N/A 12/19/2019   Procedure: ESOPHAGOGASTRODUODENOSCOPY (EGD);  Surgeon: [name], MD;  Location: [facility];  Service: Endoscopy;  Laterality: N/A;  . ESOPHAGOGASTRODUODENOSCOPY  01/2020   pre existing AXIOS cystogastrostomy stent s/p necrosectomy - [facility] hospitalization (Dr Mont Dutton)  . IR FLUORO GUIDE CV LINE RIGHT  12/27/2019  . IR REMOVAL TUN CV CATH W/O FL  01/03/2020  . IR REPLC DUODEN/JEJUNO TUBE PERCUT W/FLUORO  04/01/2020  . SPHINCTEROTOMY  12/14/2019   Procedure: SPHINCTEROTOMY;  Surgeon: [name], MD;  Location: [facility];  Service: Endoscopy;;  . 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 TRANSTHORACIC ECHOCARDIOGRAM  04/2019   EF 55-60%, modLVH, impaired relaxation   . TRIGGER FINGER RELEASE  2007;2010;2011   bilateral  . TRIGGER FINGER RELEASE  02/2017  . VAGINAL HYSTERECTOMY  1984   for menorrhagia, ovaries in place    Prior to Admission medications   Medication Sig Start Date End Date Taking? Authorizing Provider  acetaminophen (TYLENOL) 325 MG tablet Take 650 mg by mouth every 6 (six) hours as needed for mild pain.   Yes [provider]  conjugated estrogens (PREMARIN) vaginal cream Place 1 Applicatorful vaginally daily. Only to be applied topically Patient taking differently: Place 1 Applicatorful vaginally every other day. Only to be applied topically 03/14/20  Yes Nicole Kindred A, DO  enoxaparin (LOVENOX) 80 MG/0.8ML injection Inject 0.65 mLs (65 mg total) into the skin 2 (two) times daily. 03/13/20  Yes Nicole Kindred A, DO  insulin lispro (HUMALOG) 100 UNIT/ML injection Inject 4-12  Units into the skin 3 (three) times daily before meals. (according to sliding scale - max 36u daily)   Yes [provider]  LANTUS SOLOSTAR 100 UNIT/ML Solostar Pen Inject 12 Units into the skin at bedtime. 04/17/20  Yes [provider]  loperamide (IMODIUM A-D) 2 MG tablet Take 2 mg by mouth 4 (four) times daily as needed for diarrhea or loose stools.   Yes [provider]  meclizine (ANTIVERT) 25 MG tablet Take 1 tablet (25 mg total) by mouth 3 (three) times daily as needed for nausea. 03/13/20  Yes Nicole Kindred A, DO  metoCLOPramide (REGLAN) 10 MG tablet Take 1 tablet (10 mg total) by mouth every 6 (six) hours. 03/13/20  Yes Nicole Kindred A, DO  ACCU-CHEK AVIVA PLUS test strip USE AS DIRECTED TO CHECK BLOOD SUGARS ONCE DAILY E11.21 04/17/20   [name], MD  acetaminophen (TYLENOL) 160 MG/5ML suspension Place 30.5 mg into feeding tube every 6 (six) hours as needed for mild pain or moderate pain.  Patient not taking: Reported on 04/23/2020  [provider]  amLODIPine Benzoate 1 MG/ML SUSP 2.5 mLs by Per J Tube route daily in the afternoon. Patient not taking: Reported on 04/23/2020    [provider]  benazepril (LOTENSIN) 20 MG tablet 20 mg by Per J Tube route daily. Patient not taking: Reported on 04/23/2020    [provider]  Multiple Vitamin (MULTIVITAMIN) tablet 1 tablet by Per J Tube route daily. Patient not taking: Reported on 04/23/2020    [provider]  Nutritional Supplements (FEEDING SUPPLEMENT, JEVITY 1.5 CAL/FIBER,) LIQD 1,000 mLs by Per J Tube route continuous. Goal rate 50 cc/hr, reduce by 10 cc/hr as needed if nausea occurs. Patient not taking: Reported on 04/23/2020 03/13/20   Ezekiel Slocumb, DO  Pancrelipase, Lip-Prot-Amyl, [phone] units TABS 2 tablets by Per J Tube route 3 (three) times daily with meals. Patient not taking: Reported on 04/23/2020    [provider]    Scheduled Meds: .  insulin aspart  0-15 Units Subcutaneous Q4H  . insulin glargine  12 Units Subcutaneous Daily  . metoprolol tartrate  2.5 mg Intravenous Q6H  . pantoprazole (PROTONIX) IV  40 mg Intravenous Q12H   Continuous Infusions: . dextrose 5 % and 0.9% NaCl 75 mL/hr at 04/23/20 0503   PRN Meds:.dextrose, hydrALAZINE  Allergies as of 04/22/2020 - Review Complete 04/22/2020  Allergen Reaction Noted  . Azithromycin Itching 02/05/2017  . Nickel  01/05/2019  . Sulfa antibiotics  12/01/2011  . Vinegar [acetic acid]  01/07/2020  . Adhesive [tape] Rash 12/01/2011    Family History  Problem Relation Age of Onset  . Stroke Mother        several  . Hyperlipidemia Mother   . Hypertension Mother   . Cancer Father        colon  . Hypertension Father   . Hyperlipidemia Father   . Coronary artery disease Father 7       MIx1, CABG  . Cancer Paternal Aunt        abdominal  . Coronary artery disease Maternal Grandmother   . Diabetes Maternal Grandfather   . Coronary artery disease Maternal Grandfather   . Breast cancer Neg Hx     Social History   Socioeconomic History  . Marital status: Married    Spouse name: Not on file  . Number of children: Not on file  . Years of education: Not on file  . Highest education level: Not on file  Occupational History  . Not on file  Tobacco Use  . Smoking status: Never Smoker  . Smokeless tobacco: Never Used  Vaping Use  . Vaping Use: Never used  Substance and Sexual Activity  . Alcohol use: Not Currently    Alcohol/week: 1.0 - 2.0 standard drink    Types: 1 - 2 Glasses of wine per week    Comment: Occasional/1 mixed drink per month  . Drug use: No  . Sexual activity: Yes  Other Topics Concern  . 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 Daily walking about 20 min.    Diet: ok water intake 4 glasses/day, daily fruits/vegetables, red meat 4x/wk, fish 3-4x/wk   Social Determinants of Health   Financial Resource Strain: Low Risk   . Difficulty of Paying Living Expenses: Not  hard at all  Food Insecurity: No Food Insecurity  . Worried About Charity fundraiser in the Last Year: Never true  . Ran Out of Food in the Last Year: Never true  Transportation Needs: No Transportation Needs  . Lack of Transportation (Medical): No  . Lack of Transportation (Non-Medical): No  Physical Activity: Inactive  . Days of Exercise per Week: 0 days  . Minutes of Exercise per Session: 0 min  Stress: No Stress Concern Present  . Feeling of Stress : Not at all  Social Connections:   . Frequency of Communication with Friends and Family:   . Frequency of Social Gatherings with Friends and Family:   . Attends Religious Services:   . Active Member of Clubs or Organizations:   . Attends Archivist Meetings:   Marland Kitchen Marital Status:   Intimate Partner Violence: Not At Risk  . Fear of Current or Ex-Partner: No  . Emotionally Abused: No  . Physically Abused: No  . Sexually Abused: No    Review of Systems: Review of Systems  Constitutional: Positive for malaise/fatigue and weight loss. Negative for chills and fever.  HENT: Negative for hearing loss and tinnitus.   Eyes: Negative for pain and redness.  Respiratory: Positive for shortness of breath (on exertion). Negative for cough.   Cardiovascular: Negative for chest pain and palpitations.  Gastrointestinal: Positive for melena, nausea and vomiting. Negative for abdominal pain, blood in stool, constipation, diarrhea and heartburn.  Genitourinary: Negative for flank pain and hematuria.  Musculoskeletal: Negative for back pain and joint pain.  Skin: Negative for itching and rash.  Neurological: Negative for seizures and loss of consciousness.  Endo/Heme/Allergies: Negative for  polydipsia. Bruises/bleeds easily (Lovenox).  Psychiatric/Behavioral: Negative for substance abuse. The patient is not nervous/anxious.    Physical Exam: Vital signs: Vitals:   04/23/20 0747 04/23/20 1141  BP: 125/66 138/75  Pulse: 84 90  Resp: 20 20  Temp: (!) 97.5 F (36.4 C) (!) 97.5 F (36.4 C)  SpO2: 100% 95%     Physical Exam Constitutional:      General: She is awake. She is not in acute distress. HENT:     Head: Normocephalic and atraumatic.     Nose: Nose normal.     Mouth/Throat:     Mouth: Mucous membranes are moist.     Pharynx: Oropharynx is clear.  Eyes:     General: No scleral icterus.    Extraocular Movements: Extraocular movements intact.     Comments: Conjunctival pallor  Cardiovascular:     Rate and Rhythm: Normal rate and regular rhythm.     Pulses: Normal pulses.     Heart sounds: Normal heart sounds.  Pulmonary:     Effort: Pulmonary effort is normal. No respiratory distress.     Breath sounds: Normal breath sounds.  Abdominal:     General: Abdomen is flat. Bowel sounds are normal. There is no distension.     Palpations: Abdomen is soft. There is no mass.     Tenderness: There is no abdominal tenderness. There is no guarding or rebound.     Hernia: No hernia is present.     Comments: PEG tube present, black/green drainage noted on surrounding dressing  Musculoskeletal:        General: No swelling or tenderness.     Cervical back: Normal range of motion and neck supple.  Skin:    General: Skin is warm and dry.     Coloration: Skin is  pale.  Neurological:     General: No focal deficit present.     Mental Status: She is oriented to person, place, and time.  Psychiatric:        Mood and Affect: Mood normal.        Behavior: Behavior normal. Behavior is cooperative.    GI:  Lab Results: Recent Labs    04/22/20 2357 04/23/20 0350 04/23/20 0738  WBC 9.7 8.9 9.4  HGB 7.5* 7.4* 7.8*  HCT 22.8* 22.5* 23.7*  PLT 176 170 174   BMET Recent  Labs    04/22/20 1141 04/22/20 1656 04/22/20 2357  NA 129* 133* 139  K 4.2 4.1 4.0  CL 97* 104 108  CO2 16* 19* 21*  GLUCOSE 502* 420* 140*  BUN 31* 25* 21  CREATININE 1.05* 0.93 0.79  CALCIUM 9.4 8.8* 9.4   LFT Recent Labs    04/22/20 1141  PROT 5.9*  ALBUMIN 3.2*  AST 11*  ALT 10  ALKPHOS 71  BILITOT 1.1   PT/INR Recent Labs    04/22/20 1141  LABPROT 14.4  INR 1.2     Studies/Results: [place] EKG SITE RITE  Result Date: 04/23/2020 If Site Rite image not attached, placement could not be confirmed due to current cardiac rhythm.  [place] EKG SITE RITE  Result Date: 04/22/2020 If Site Rite image not attached, placement could not be confirmed due to current cardiac rhythm.   Impression: Suspected upper GI bleeding: Melena and coffee ground emesis x1-2 weeks -Hgb 4.4 on arrival yesterday, improved to 7.8 after 2u pRBCs  -BUN elevated to 31 and Cr 1.05 on arrival, suggestive of upper GI bleeding.  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 Penelope Coop.  I thoroughly discussed the procedure with the patient to include nature, benefits, and risks (including but not limited to bleeding, infection, perforation, anesthesia/cardiac and pulmonary complications).  Patient verbalized understanding and gave verbal consent to proceed with the EGD tomorrow with Dr. Penelope Coop.  Continue to monitor H&H with transfusion as needed to maintain hemoglobin greater than 7.  Continue Protonix 40 mg IV twice daily.  [facility] will follow.   LOS: 1 day   Salley Slaughter  PA-C 04/23/2020, 3:41 PM  Contact #  [phone]

## 2020-04-23 NOTE — Telephone Encounter (Signed)
[place] Night - Client Nonclinical Telephone Record AccessNurse Client [place] Night - Client Client [place] Physician [name] - MD Contact Type Call Call [place] Page Now Who Is [place] / [place] Name [place] Name [name] Number [phone] Patient Name Katherine Oliver. [place] Patient DOB 1946-03-30 Reason for Call Report lab results Initial Comment Caller stated she is calling from [name] to report lab results. Additional Comment Disp. Time Disposition Final User 04/22/2020 9:16:06 AM Send to [facility] Paging Queue [place] Old 04/22/2020 9:25:11 AM Paged On Call back to Call Bureau, [place] 04/22/2020 9:35:31 AM Page Completed Yes Carlis Stable Paging DoctorName Phone DateTime Result/Outcome Message Type Notes [name] - MD [phone] 04/22/2020 9:25:11 AM Called On Call Provider - Left Message Doctor Paged Please call back Access nurse at [phone] [name] - MD 04/22/2020 9:35:22 AM Spoke with On Call - General Message Result The provider was connected to [facility]. Call Closed By: Carlis Stable Transaction Date/Time: 04/22/2020 9:12:42 AM (ET)

## 2020-04-23 NOTE — Telephone Encounter (Signed)
Pt admitted with UGIB

## 2020-04-23 NOTE — Progress Notes (Signed)
Patient ID: Katherine Oliver, female   DOB: 1946-04-22, 74 y.o.   MRN: [id number]  PROGRESS NOTE    Ricka Westra Howry  WUJ:[id number] DOB: [birth year] DOA: 04/22/2020 PCP: [name], MD    Brief Narrative:  [name] is a 74 y.o. female with history of diabetes mellitus type 2 hypertension A. fib DVT recently admitted from December 13, 2019 up to February 24, 2020 for acute pancreatitis and complications.  Patient was initially admitted on December 13, 2019 for presumed gallstone pancreatitis patient had undergone ERCP which showed biliary sludge with successful placement of biliary stent.  However patient developed acute renal failure requiring CRRT.  Patient also developed A. fib with RVR.  Since patient started having worsening abdominal pain nausea and distention with evidence of new peripancreatic fluid collection despite CBD stent patient was transferred to [facility] for further care where patient underwent cystogastrostomy placement and double pigtail stents endoscopic necrosectomy and percutaneous drainage of the cyst.  During the hospitalization patient also developed gastroduodenal artery bleeding and required embolization.  Patient also was started on Lovenox for DVT.  Patient was discharged to [facility] on February 24, 2020.  With drains in place.  Patient was discharged from the SNF on April 19, 2020 about 4 days ago and the home health nurse noticed that patient has been pale looking. Patient states she also has been having some nausea vomiting with dark stools for the last 2 weeks.  Patient has a gastrostomy tube which only patient states she takes certain medications but most of the feeds he is taking orally for the last 2 weeks.  Denies taking any NSAIDs.  Patient had followed up with her PCP and labs showed hemoglobin to be around 4 was referred to the ER.  In the ER patient's labs showed hemoglobin of around 4.4 and complete metabolic panel  showed glucose of 502 with anion gap of 16 bicarb of 16.  Patient was started on IV insulin infusion for DKA 2 units of PRBC transfusion was ordered for acute GI bleed and patient was transferred to [facility] for the need for possible IR embolization if needed.  On exam patient abdomen appears benign.  Denies chest pain or shortness of breath.   Assessment & Plan:   Principal Problem:   Acute GI bleeding Active Problems:   NAFLD (nonalcoholic fatty liver disease)   AF (paroxysmal atrial fibrillation) (HCC)   Chronic pancreatitis ([place])   Gastrostomy in place [facility])   Acute blood loss anemia   DKA (diabetic ketoacidoses) (HCC)  Acute GI bleed -given the recent history of having gastroduodenal artery bleeding at [facility] requiring embolization patient is transferred to [facility] for possible IR consult if needed for embolization.  We will keep patient on Protonix IV 2 units of PRBC transfusion has been already transfused will check repeat CBC.  Keep patient n.p.o.  Consult GI and IR in the morning. Presently hemodynamically stable.  Holding Lovenox.  DKA -patient states she usually takes basal insulin of 20 noticed which patient was not receiving in the SNF as per the patient. Patient still is presently on IV insulin infusion for DKA protocol. Closely follow metabolic panel. Acidemia and gap resolved. Continue to follow Lantus 12 units given. NPO for possible procedure today No IV access-->PICC ordered  Blood loss anemia secondary GI bleed follow CBC transfuse if hemoglobin less than 7.  History of paroxysmal atrial fibrillation and recent diagnosis of DVT holding Lovenox due to acute  GI bleed with severe anemia. For heart rate control keep patient on scheduled dose of metoprolol 2.5 IV every 6 with holding parameters.  May need IVC filter given the GI bleed.  Hypertension in addition to scheduled dose of metoprolol 12.5 IV every 6 we will keep patient as needed IV  hydralazine.  Acute necrotizing pancreatitis with complications as discussed in the HPI.   DVT prophylaxis: SCD/Compression stockings Code Status: Full code  Family Communication: Patient at bedside Disposition Plan: to be determined   Consultants:   [facility] as she is unassigned  IR  Procedures:  2 u PRBCs  Antimicrobials: Anti-infectives (From admission, onward)   None      Subjective: Feels ok, just weak. Insulin gtt is off.  Objective: Vitals:   04/23/20 0410 04/23/20 0747 04/23/20 1141  BP: 129/60 125/66 138/75  Pulse: 93 84 90  Resp:  20 20  Temp: 97.7 F (36.5 C) (!) 97.5 F (36.4 C) (!) 97.5 F (36.4 C)  TempSrc: Oral Oral Oral  SpO2: 98% 100% 95%    Intake/Output Summary (Last 24 hours) at 04/23/2020 1536 Last data filed at 04/23/2020 0303 Gross per 24 hour  Intake 223.25 ml  Output --  Net 223.25 ml   There were no vitals filed for this visit.  Examination:  General exam: Appears calm and comfortable  Respiratory system: Clear to auscultation. Respiratory effort normal. Cardiovascular system: S1 & S2 heard, RRR.  Gastrointestinal system: Abdomen is nondistended, soft and nontender. G-tube in place without erythema Central nervous system: Alert and oriented. No focal neurological deficits. Extremities: Symmetric  Skin: No rashes Psychiatry: Judgement and insight appear normal. Mood & affect appropriate.     Data Reviewed: I have personally reviewed following labs and imaging studies  CBC: Recent Labs  Lab 04/22/20 1141 04/22/20 2357 04/23/20 0350 04/23/20 0738  WBC 9.7 9.7 8.9 9.4  NEUTROABS 8.0*  --   --   --   HGB 4.4* 7.5* 7.4* 7.8*  HCT 13.6* 22.8* 22.5* 23.7*  MCV 86.6 88.7 89.6 91.2  PLT 188 176 170 062   Basic Metabolic Panel: Recent Labs  Lab 04/22/20 1141 04/22/20 1656 04/22/20 2357  NA 129* 133* 139  K 4.2 4.1 4.0  CL 97* 104 108  CO2 16* 19* 21*  GLUCOSE 502* 420* 140*  BUN 31* 25* 21  CREATININE 1.05*  0.93 0.79  CALCIUM 9.4 8.8* 9.4   GFR: Estimated Creatinine Clearance: 50.4 mL/min (by C-G formula based on SCr of 0.79 mg/dL). Liver Function Tests: Recent Labs  Lab 04/22/20 1141  AST 11*  ALT 10  ALKPHOS 71  BILITOT 1.1  PROT 5.9*  ALBUMIN 3.2*   Recent Labs  Lab 04/22/20 1142  LIPASE 38   Coagulation Profile: Recent Labs  Lab 04/22/20 1141  INR 1.2   CBG: Recent Labs  Lab 04/23/20 0200 04/23/20 0302 04/23/20 0412 04/23/20 0738 04/23/20 1142  GLUCAP 146* 149* 161* 122* 239*     Recent Results (from the past 240 hour(s))  SARS Coronavirus 2 by RT PCR (hospital order, performed in [facility] hospital lab) Nasopharyngeal Nasopharyngeal Swab     Status: None   Collection Time: 04/22/20 12:02 PM   Specimen: Nasopharyngeal Swab  Result Value Ref Range Status   SARS Coronavirus 2 NEGATIVE NEGATIVE Final    Comment: (NOTE) SARS-CoV-2 target nucleic acids are NOT DETECTED.  The SARS-CoV-2 RNA is generally detectable in upper and lower respiratory specimens during the acute phase of infection. The lowest  concentration of SARS-CoV-2 viral copies this assay can detect is 250 copies / mL. A negative result does not preclude SARS-CoV-2 infection and should not be used as the sole basis for treatment or other patient management decisions.  A negative result may occur with improper specimen collection / handling, submission of specimen other than nasopharyngeal swab, presence of viral mutation(s) within the areas targeted by this assay, and inadequate number of viral copies (<250 copies / mL). A negative result must be combined with clinical observations, patient history, and epidemiological information.  Fact Sheet for Patients:   StrictlyIdeas.no  Fact Sheet for Healthcare Providers: BankingDealers.co.za  This test is not yet approved or  cleared by the [place] FDA and has been authorized for detection and/or  diagnosis of SARS-CoV-2 by FDA under an Emergency Use Authorization (EUA).  This EUA will remain in effect (meaning this test can be used) for the duration of the COVID-19 declaration under Section 564(b)(1) of the Act, 21 U.S.C. section 360bbb-3(b)(1), unless the authorization is terminated or revoked sooner.  Performed at [facility], [address], [place], [place] [address]       Radiology Studies: [place] EKG SITE RITE  Result Date: 04/23/2020 If [facility] image not attached, placement could not be confirmed due to current cardiac rhythm.  [place] EKG SITE RITE  Result Date: 04/22/2020 If Site Rite image not attached, placement could not be confirmed due to current cardiac rhythm.    Scheduled Meds: . insulin aspart  0-15 Units Subcutaneous Q4H  . insulin glargine  12 Units Subcutaneous Daily  . metoprolol tartrate  2.5 mg Intravenous Q6H  . pantoprazole (PROTONIX) IV  40 mg Intravenous Q12H   Continuous Infusions: . dextrose 5 % and 0.9% NaCl 75 mL/hr at 04/23/20 0503     LOS: 1 day    Donnamae Jude, MD 04/23/2020 3:36 PM [phone] [facility] If 7PM-7AM, please contact night-coverage 04/23/2020, 3:36 PM

## 2020-04-23 NOTE — Progress Notes (Signed)
I was asked by Dr. Kennon Rounds to see the patient but she is an unassigned patient. She has never seen Dr. Benson Norway or me in the office. So she will have to be seen by the GI MD on for unassigned patients at [facility] today.

## 2020-04-23 NOTE — Plan of Care (Signed)
Discussed with patient plan of care for the evening, pain management and IR to insert PICC line tomorrow with some teach back displayed

## 2020-04-23 NOTE — Progress Notes (Signed)
Inpatient Diabetes Program Recommendations  AACE/ADA: New Consensus Statement on Inpatient Glycemic Control (2015)  Target Ranges:  Prepandial:   less than 140 mg/dL      Peak postprandial:   less than 180 mg/dL (1-2 hours)      Critically ill patients:  140 - 180 mg/dL   Lab Results  Component Value Date   GLUCAP 239 (H) 04/23/2020   HGBA1C 6.2 (H) 03/06/2020    Review of Glycemic Control  Diabetes history: Pancreoprivic DM  Outpatient Diabetes medications: Lantus 12 units QHD, Humalog 4-12 units tidwc Current orders for Inpatient glycemic control: Lantus 12 units QD, Novolog 0-15 units Q4H.  Inpatient Diabetes Program Recommendations:     Add Novolog 3 units Q4H to cover TF/po intake.  Pt sees Endo at [facility]. Endo ordered: Lantus 12 units QHS, Novolog 4-12 units Q4H. Has been educated on insulin pens, monitoring, hypoglycemia s/s and treatment.  Follow closely.  Thank you. Lorenda Peck, RD, LDN, CDE Inpatient Diabetes Coordinator [phone]

## 2020-04-23 NOTE — Telephone Encounter (Signed)
[place] Night - Client TELEPHONE ADVICE RECORD [facility] Patient Name: YUKO COVENTRY Gender: Female DOB: 1946/03/30 Age: 74 Y 23 M 12 D Return Phone Number: Address: City/State/Zip: [place] Client [place] Night - Client Client Site [place] Physician [name] - MD Contact Type Call Who Is Calling Lab Lab Name [facility] [place] Lab Phone Number [phone] ext [phone] Lab Tech Name Al Decant Lab Reference Number [id number] Chief Complaint Lab Result (Critical or Stat) Call Type Lab Send to RN Reason for Call Report lab results Initial Comment Caller states she has a stat result to report. Additional Comment Translation No Nurse Assessment Nurse: Enrigue Catena Date/Time Eilene Ghazi Time): 04/22/2020 3:21:47 PM Confirm and document reason for call. If symptomatic, describe symptoms. ---Caller needs to report stat labs. Has the patient had close contact with a person known or suspected to have the novel coronavirus illness OR traveled / lives in area with major community spread (including international travel) in the last 14 days from the onset of symptoms? * If Asymptomatic, screen for exposure and travel within the last 14 days. ---No Does the patient have any new or worsening symptoms? ---No Nurse: Enrigue Catena Date/Time Eilene Ghazi Time): 04/22/2020 3:21:48 PM Is there an on-call provider listed? ---Yes Please list name of person reporting value (Lab Employee) and a contact number. ---Gasper Sells Please document the following items: Lab name Lab value (read back to lab to verify) Reference range for lab value Date and time blood was drawn Collect time of birth for bilirubin results ---[birth year] @ Unknown time RBC - 1.66 (3.77-5.28) Hemoglobin - 4.5 (11.1-15.9) Hematocrit - 14.2 (34-46.6) RDW - 15.9 (11.7-15.4) Guidelines Guideline Title Affirmed Question Affirmed  Notes Nurse Date/Time ([place] Time) Disp. Time Eilene Ghazi Time) Disposition Final User 04/22/2020 3:27:48 PM Paged On Call back to Call [place] NOTE: All timestamps contained within this report are represented as [place] Standard Time. CONFIDENTIALTY NOTICE: This fax transmission is intended only for the addressee. It contains information that is legally privileged, confidential or otherwise protected from use or disclosure. If you are not the intended recipient, you are strictly prohibited from reviewing, disclosing, copying using or disseminating any of this information or taking any action in reliance on or regarding this information. If you have received this fax in error, please notify us immediately by telephone so that we can arrange for its return to [place]. Phone: [phone], Toll-Free: [phone], Fax: [phone] Page: 2 of 2 Call Id: [id number] 04/22/2020 3:30:37 PM Clinical Call Yes Enrigue Catena Paging DoctorName Phone DateTime Result/Outcome Message Type Notes [name] - MD [phone] 04/22/2020 3:27:48 PM Paged On Call Back to Call Center Doctor Paged Ria Comment, RN at [phone] [name] - MD 04/22/2020 3:30:29 PM Spoke with On Call - General Message Result Results given

## 2020-04-24 ENCOUNTER — Inpatient Hospital Stay (HOSPITAL_COMMUNITY): Payer: Medicare Other | Admitting: Anesthesiology

## 2020-04-24 ENCOUNTER — Encounter (HOSPITAL_COMMUNITY)
Disposition: A | Discharge status: Home Health | Payer: Self-pay | Source: Other Acute Inpatient Hospital | Attending: Family Medicine

## 2020-04-24 ENCOUNTER — Encounter: Payer: Self-pay | Admitting: Family Medicine

## 2020-04-24 ENCOUNTER — Inpatient Hospital Stay (HOSPITAL_COMMUNITY): Payer: Medicare Other

## 2020-04-24 HISTORY — PX: IR FLUORO GUIDE CV LINE RIGHT: IMG2283

## 2020-04-24 HISTORY — PX: IR US GUIDE VASC ACCESS RIGHT: IMG2390

## 2020-04-24 HISTORY — PX: ESOPHAGOGASTRODUODENOSCOPY: SHX5428

## 2020-04-24 LAB — CBC
HCT: 25 % — ABNORMAL LOW (ref 36.0–46.0)
Hemoglobin: 8.1 g/dL — ABNORMAL LOW (ref 12.0–15.0)
MCH: 29.6 pg (ref 26.0–34.0)
MCHC: 32.4 g/dL (ref 30.0–36.0)
MCV: 91.2 fL (ref 80.0–100.0)
Platelets: 139 10*3/uL — ABNORMAL LOW (ref 150–400)
RBC: 2.74 MIL/uL — ABNORMAL LOW (ref 3.87–5.11)
RDW: 17.4 % — ABNORMAL HIGH (ref 11.5–15.5)
WBC: 7.4 10*3/uL (ref 4.0–10.5)
nRBC: 0.5 % — ABNORMAL HIGH (ref 0.0–0.2)

## 2020-04-24 LAB — BASIC METABOLIC PANEL
Anion gap: 7 (ref 5–15)
BUN: 14 mg/dL (ref 8–23)
CO2: 18 mmol/L — ABNORMAL LOW (ref 22–32)
Calcium: 8.6 mg/dL — ABNORMAL LOW (ref 8.9–10.3)
Chloride: 114 mmol/L — ABNORMAL HIGH (ref 98–111)
Creatinine, Ser: 0.76 mg/dL (ref 0.44–1.00)
GFR calc Af Amer: >60 mL/min (ref >60)
GFR calc non Af Amer: >60 mL/min (ref >60)
Glucose, Bld: 203 mg/dL — ABNORMAL HIGH (ref 70–99)
Potassium: 3.6 mmol/L (ref 3.5–5.1)
Sodium: 139 mmol/L (ref 135–145)

## 2020-04-24 LAB — GLUCOSE, CAPILLARY
Glucose-Capillary: 181 mg/dL — ABNORMAL HIGH (ref 70–99)
Glucose-Capillary: 190 mg/dL — ABNORMAL HIGH (ref 70–99)
Glucose-Capillary: 181 mg/dL — ABNORMAL HIGH (ref 70–99)
Glucose-Capillary: 177 mg/dL — ABNORMAL HIGH (ref 70–99)
Glucose-Capillary: 207 mg/dL — ABNORMAL HIGH (ref 70–99)
Glucose-Capillary: 218 mg/dL — ABNORMAL HIGH (ref 70–99)

## 2020-04-24 SURGERY — EGD (ESOPHAGOGASTRODUODENOSCOPY)
Anesthesia: Monitor Anesthesia Care

## 2020-04-24 MED ORDER — LIDOCAINE HCL 1 % IJ SOLN
INTRAMUSCULAR | Status: AC
  Filled 2020-04-24: qty 20

## 2020-04-24 MED ORDER — BUTAMBEN-TETRACAINE-BENZOCAINE 2-2-14 % EX AERO
INHALATION_SPRAY | CUTANEOUS | Status: DC | PRN
  Administered 2020-04-24: 2 via TOPICAL

## 2020-04-24 MED ORDER — DIPHENOXYLATE-ATROPINE 2.5-0.025 MG PO TABS
2.0000 | ORAL_TABLET | Freq: Four times a day (QID) | ORAL | Status: DC
  Administered 2020-04-24 – 2020-04-28 (×15): 2 via ORAL
  Filled 2020-04-24 (×15): qty 2

## 2020-04-24 MED ORDER — ACETAMINOPHEN 325 MG PO TABS
650.0000 mg | ORAL_TABLET | Freq: Four times a day (QID) | ORAL | Status: DC | PRN
  Administered 2020-04-24 – 2020-04-27 (×5): 650 mg via ORAL
  Filled 2020-04-24 (×5): qty 2

## 2020-04-24 MED ORDER — PROPOFOL 10 MG/ML IV BOLUS
INTRAVENOUS | Status: DC | PRN
  Administered 2020-04-24: 20 mg via INTRAVENOUS

## 2020-04-24 MED ORDER — SODIUM CHLORIDE 0.9 % IV SOLN: INTRAVENOUS | Status: DC

## 2020-04-24 MED ORDER — LIDOCAINE HCL (PF) 1 % IJ SOLN
INTRAMUSCULAR | Status: AC | PRN
  Administered 2020-04-24: 10 mL

## 2020-04-24 MED ORDER — CHLORHEXIDINE GLUCONATE CLOTH 2 % EX PADS
6.0000 | MEDICATED_PAD | Freq: Every day | CUTANEOUS | Status: DC
  Administered 2020-04-24 – 2020-04-28 (×4): 6 via TOPICAL

## 2020-04-24 MED ORDER — LOPERAMIDE HCL 2 MG PO CAPS: 2.0000 mg | ORAL_CAPSULE | Freq: Four times a day (QID) | ORAL | Status: DC | PRN

## 2020-04-24 MED ORDER — METOCLOPRAMIDE HCL 10 MG PO TABS
10.0000 mg | ORAL_TABLET | Freq: Four times a day (QID) | ORAL | Status: DC
  Administered 2020-04-24 – 2020-04-28 (×17): 10 mg via ORAL
  Filled 2020-04-24 (×18): qty 1

## 2020-04-24 MED ORDER — PROPOFOL 500 MG/50ML IV EMUL
INTRAVENOUS | Status: DC | PRN
  Administered 2020-04-24: 100 ug/kg/min via INTRAVENOUS

## 2020-04-24 MED ORDER — INSULIN ASPART 100 UNIT/ML ~~LOC~~ SOLN
4.0000 [IU] | Freq: Three times a day (TID) | SUBCUTANEOUS | Status: DC
  Administered 2020-04-25 – 2020-04-28 (×12): 4 [IU] via SUBCUTANEOUS

## 2020-04-24 MED ORDER — IOHEXOL 350 MG/ML SOLN
100.0000 mL | Freq: Once | INTRAVENOUS | Status: AC | PRN
  Administered 2020-04-24: 100 mL via INTRAVENOUS

## 2020-04-24 NOTE — Procedures (Signed)
Interventional Radiology Procedure Note  Procedure: RT IJ TUNNELED PICC  Complications: None  Estimated Blood Loss: MIN  Findings: TIP SVCRA

## 2020-04-24 NOTE — H&P (Signed)
The patient is in the endoscopy unit to have an EGD to evaluate for upper GI bleed characterized by melena and anemia and coffee-ground emesis.  Complicated medical history reviewed no changes.  Physical:  No distress  Heart regular rhythm  Lungs clear  Abdomen soft and nontender  Impression: Anemia, suspect upper GI source in view of melena and coffee-ground emesis  Plan proceed with EGD

## 2020-04-24 NOTE — Progress Notes (Signed)
Patient ID: Katherine Oliver, female   DOB: Aug 13, 1946, 74 y.o.   MRN: [id number]  PROGRESS NOTE    Katherine Oliver  WBL:[id number] DOB: 05-29-1946 DOA: 04/22/2020 PCP: Eustaquio Boyden, MD    Brief Narrative:  Katherine Oliver a 74 y.o.femalewithhistory of diabetes mellitus type 2 hypertension A. fib DVT recently admitted from December 13, 2019 up to February 24, 2020 for acute pancreatitis and complications. Patient was initially admitted on December 13, 2019 for presumed gallstone pancreatitis patient had undergone ERCP which showed biliary sludge with successful placement of biliary stent. However patient developed acute renal failure requiring CRRT. Patient also developed A. fib with RVR. Since patient started having worsening abdominal pain nausea and distention with evidence of new peripancreatic fluid collection despite CBD stent patient was transferred to [facility] for further care where patient underwent cystogastrostomy placement and double pigtail stents endoscopic necrosectomy and percutaneous drainage of the cyst. During the hospitalization patient also developed gastroduodenal artery bleeding and required embolization. Patient also was started on Lovenox for DVT. Patient was discharged to [facility] on February 24, 2020. With drains in place. Patient was discharged from the SNF on April 19, 2020 about 4 days ago and the home health nurse noticed that patient has been pale looking. Patient states she also has been having some nausea vomiting with dark stools for the last 2 weeks. Patient has a gastrostomy tube which only patient states she takes certain medications but most of the feeds he is taking orally for the last 2 weeks. Denies taking any NSAIDs. Patient had followed up with her PCP and labs showed hemoglobin to be around 4 was referred to the ER. In the ER patient's labs showed hemoglobin of around 4.4 and complete metabolic panel  showed glucose of 502 with anion gap of 16 bicarb of 16. Patient was started on IV insulin infusion for DKA 2 units of PRBC transfusion was ordered for acute GI bleed and patient was transferred to [facility] for the need for possible IR embolization if needed. On exam patient abdomen appears benign. Denies chest pain or shortness of breath.   Assessment & Plan:   Principal Problem:   Acute GI bleeding Active Problems:   NAFLD (nonalcoholic fatty liver disease)   AF (paroxysmal atrial fibrillation) (HCC)   Chronic pancreatitis (HCC)   Gastrostomy in place [facility])   Acute blood loss anemia   DKA (diabetic ketoacidoses) (HCC)  Acute GI bleed-given the recent history of having gastroduodenal artery bleeding at [facility] requiring embolization patient is transferred to [facility] for possible IR consult if needed for embolization.  Protonix IV 2 units of PRBC transfusio.  Presently hemodynamically stable. Holding Lovenox. S/p CT angio, no findings of bleeding. S/p EGD, no source noted. Advance diet. Hgb stable, consider resumption of lovenox  DKA-patient states she usually takes basal insulin of 20 noticed which patient was not receiving in the SNF as per the patient. Patient still is presently on IV insulin infusion for DKA protocol. Closely follow metabolic panel. Acidemia and gap resolved. Continue to follow Lantus 12 units given. NPO for possible procedure today No IV access-->PICC ordered--placed by IR  Blood loss anemia secondary GI bleed follow CBC transfuse if hemoglobin less than 7. Stable at 8 today  History of paroxysmal atrial fibrillation and recent diagnosis of DVT holding Lovenox due to acute GI bleed with severe anemia. For heart rate control keep patient on scheduled dose of metoprolol 2.5 IV every 6 with  holding parameters. May need IVC filter given the GI bleed.-->if stable in am, consider resumption of blood thinners  Hypertension in addition to  scheduled dose of metoprolol 12.5 IV every 6 we will keep patient as needed IV hydralazine.--consider change to po meds.  Acute necrotizing pancreatitis with complications as discussed in the HPI.   DVT prophylaxis: SCD/Compression stockings Code Status: Full code  Family Communication: Patient and husband at bedside Disposition Plan: home   Consultants:   [facility]  IR  Procedures:  2 u PRBCs  PICC line placement  EGD--no active bleeding  Antimicrobials: Anti-infectives (From admission, onward)   None       Subjective: Feels well. Having some loose stools.  Objective: Vitals:   04/24/20 1439 04/24/20 1448 04/24/20 1506 04/24/20 1600  BP: (!) 123/50 (!) 145/63 131/63   Pulse:  82 71 94  Resp: (!) '23 20 19   '$ Temp: 97.8 F (36.6 C)  98.1 F (36.7 C)   TempSrc: Oral  Oral   SpO2: 97% 98%  98%  Weight:      Height:        Intake/Output Summary (Last 24 hours) at 04/24/2020 1741 Last data filed at 04/23/2020 2300 Gross per 24 hour  Intake 1458.11 ml  Output 650 ml  Net 808.11 ml   Filed Weights   04/23/20 2311  Weight: 63.4 kg    Examination:  General exam: Appears calm and comfortable  Respiratory system: Clear to auscultation. Respiratory effort normal. Cardiovascular system: S1 & S2 heard, RRR.  Gastrointestinal system: Abdomen is nondistended, soft and nontender.  Central nervous system: Alert and oriented. No focal neurological deficits. Extremities: Symmetric  Skin: No rashes Psychiatry: Judgement and insight appear normal. Mood & affect appropriate.     Data Reviewed: I have personally reviewed following labs and imaging studies  CBC: Recent Labs  Lab 04/22/20 1141 04/22/20 2357 04/23/20 0350 04/23/20 0738 04/24/20 0900  WBC 9.7 9.7 8.9 9.4 7.4  NEUTROABS 8.0*  --   --   --   --   HGB 4.4* 7.5* 7.4* 7.8* 8.1*  HCT 13.6* 22.8* 22.5* 23.7* 25.0*  MCV 86.6 88.7 89.6 91.2 91.2  PLT 188 176 170 174 536*   Basic Metabolic  Panel: Recent Labs  Lab 04/21/20 0000 04/22/20 1141 04/22/20 1656 04/22/20 2357 04/24/20 0358  NA 133* 129* 133* 139 139  K 4.2 4.2 4.1 4.0 3.6  CL  --  97* 104 108 114*  CO2  --  16* 19* 21* 18*  GLUCOSE  --  502* 420* 140* 203*  BUN 37* 31* 25* 21 14  CREATININE 1.0 1.05* 0.93 0.79 0.76  CALCIUM 9.5 9.4 8.8* 9.4 8.6*   GFR: Estimated Creatinine Clearance: 50.5 mL/min (by C-G formula based on SCr of 0.76 mg/dL). Liver Function Tests: Recent Labs  Lab 04/22/20 1141  AST 11*  ALT 10  ALKPHOS 71  BILITOT 1.1  PROT 5.9*  ALBUMIN 3.2*   Recent Labs  Lab 04/22/20 1142  LIPASE 38   Coagulation Profile: Recent Labs  Lab 04/22/20 1141  INR 1.2   CBG: Recent Labs  Lab 04/24/20 0356 04/24/20 0743 04/24/20 1145 04/24/20 1346 04/24/20 1703  GLUCAP 181* 190* 181* 177* 207*     Recent Results (from the past 240 hour(s))  SARS Coronavirus 2 by RT PCR (hospital order, performed in [facility] hospital lab) Nasopharyngeal Nasopharyngeal Swab     Status: None   Collection Time: 04/22/20 12:02 PM   Specimen: Nasopharyngeal  Swab  Result Value Ref Range Status   SARS Coronavirus 2 NEGATIVE NEGATIVE Final    Comment: (NOTE) SARS-CoV-2 target nucleic acids are NOT DETECTED.  The SARS-CoV-2 RNA is generally detectable in upper and lower respiratory specimens during the acute phase of infection. The lowest concentration of SARS-CoV-2 viral copies this assay can detect is 250 copies / mL. A negative result does not preclude SARS-CoV-2 infection and should not be used as the sole basis for treatment or other patient management decisions.  A negative result may occur with improper specimen collection / handling, submission of specimen other than nasopharyngeal swab, presence of viral mutation(s) within the areas targeted by this assay, and inadequate number of viral copies (<250 copies / mL). A negative result must be combined with clinical observations, patient history,  and epidemiological information.  Fact Sheet for Patients:   StrictlyIdeas.no  Fact Sheet for Healthcare Providers: BankingDealers.co.za  This test is not yet approved or  cleared by the [place] FDA and has been authorized for detection and/or diagnosis of SARS-CoV-2 by FDA under an Emergency Use Authorization (EUA).  This EUA will remain in effect (meaning this test can be used) for the duration of the COVID-19 declaration under Section 564(b)(1) of the Act, 21 U.S.C. section 360bbb-3(b)(1), unless the authorization is terminated or revoked sooner.  Performed at [facility], [address] [place], [place] [address]       Radiology Studies: IR Fluoro Guide CV Line Right  Result Date: 04/24/2020 INDICATION: GI BLEED, ACUTE BLOOD LOSS ANEMIA, POOR PERIPHERAL ACCESS EXAM: TUNNELED PICC LINE WITH ULTRASOUND AND FLUOROSCOPIC GUIDANCE MEDICATIONS: 1% lidocaine local. ANESTHESIA/SEDATION: Moderate Sedation Time:  None. The patient was continuously monitored during the procedure by the interventional radiology nurse under my direct supervision. FLUOROSCOPY TIME:  Fluoroscopy Time: 0 minutes 18 seconds (1 mGy). COMPLICATIONS: None immediate. PROCEDURE: Informed written consent was obtained from the patient after a discussion of the risks, benefits, and alternatives to treatment. Questions regarding the procedure were encouraged and answered. The right neck and chest were prepped with chlorhexidine in a sterile fashion, and a sterile drape was applied covering the operative field. Maximum barrier sterile technique with sterile gowns and gloves were used for the procedure. A timeout was performed prior to the initiation of the procedure. After creating a small venotomy incision, a micropuncture kit was utilized to access the right internal jugular vein under direct, real-time ultrasound guidance after the overlying soft tissues were  anesthetized with 1% lidocaine with epinephrine. Ultrasound image documentation was performed. The microwire was kinked to measure appropriate catheter length. The micropuncture sheath was exchanged for a peel-away sheath over a guidewire. A 5 French dual lumen tunneled PICC measuring 27 cm was tunneled in a retrograde fashion from the anterior chest wall to the venotomy incision. The catheter was then placed through the peel-away sheath with tip ultimately positioned at the superior caval-atrial junction. Final catheter positioning was confirmed and documented with a spot radiographic image. The catheter aspirates and flushes normally. The catheter was flushed with appropriate volume heparin dwells. The catheter exit site was secured with a 2-0 Ethilon suture. The venotomy incision was closed with Dermabond. Dressings were applied. The patient tolerated the procedure well without immediate post procedural complication. FINDINGS: After catheter placement, the tip lies within the superior cavoatrial junction. The catheter aspirates and flushes normally and is ready for immediate use. IMPRESSION: Successful placement of dual lumen tunneled PICC catheter via the right internal jugular vein with tip terminating  at the superior caval atrial junction. The catheter is ready for immediate use. Electronically Signed   By: Jerilynn Mages.  Shick M.D.   On: 04/24/2020 10:36   IR US Guide Vasc Access Right  Result Date: 04/24/2020 INDICATION: GI BLEED, ACUTE BLOOD LOSS ANEMIA, POOR PERIPHERAL ACCESS EXAM: TUNNELED PICC LINE WITH ULTRASOUND AND FLUOROSCOPIC GUIDANCE MEDICATIONS: 1% lidocaine local. ANESTHESIA/SEDATION: Moderate Sedation Time:  None. The patient was continuously monitored during the procedure by the interventional radiology nurse under my direct supervision. FLUOROSCOPY TIME:  Fluoroscopy Time: 0 minutes 18 seconds (1 mGy). COMPLICATIONS: None immediate. PROCEDURE: Informed written consent was obtained from the patient  after a discussion of the risks, benefits, and alternatives to treatment. Questions regarding the procedure were encouraged and answered. The right neck and chest were prepped with chlorhexidine in a sterile fashion, and a sterile drape was applied covering the operative field. Maximum barrier sterile technique with sterile gowns and gloves were used for the procedure. A timeout was performed prior to the initiation of the procedure. After creating a small venotomy incision, a micropuncture kit was utilized to access the right internal jugular vein under direct, real-time ultrasound guidance after the overlying soft tissues were anesthetized with 1% lidocaine with epinephrine. Ultrasound image documentation was performed. The microwire was kinked to measure appropriate catheter length. The micropuncture sheath was exchanged for a peel-away sheath over a guidewire. A 5 French dual lumen tunneled PICC measuring 27 cm was tunneled in a retrograde fashion from the anterior chest wall to the venotomy incision. The catheter was then placed through the peel-away sheath with tip ultimately positioned at the superior caval-atrial junction. Final catheter positioning was confirmed and documented with a spot radiographic image. The catheter aspirates and flushes normally. The catheter was flushed with appropriate volume heparin dwells. The catheter exit site was secured with a 2-0 Ethilon suture. The venotomy incision was closed with Dermabond. Dressings were applied. The patient tolerated the procedure well without immediate post procedural complication. FINDINGS: After catheter placement, the tip lies within the superior cavoatrial junction. The catheter aspirates and flushes normally and is ready for immediate use. IMPRESSION: Successful placement of dual lumen tunneled PICC catheter via the right internal jugular vein with tip terminating at the superior caval atrial junction. The catheter is ready for immediate use.  Electronically Signed   By: Jerilynn Mages.  Shick M.D.   On: 04/24/2020 10:36   [place] EKG SITE RITE  Result Date: 04/23/2020 If Site Rite image not attached, placement could not be confirmed due to current cardiac rhythm.  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 History of pancreatic necrosectomy. EXAM: CT ANGIOGRAPHY ABDOMEN AND PELVIS WITH CONTRAST AND WITHOUT CONTRAST TECHNIQUE: Multidetector CT imaging of the abdomen and pelvis was performed using the standard protocol during bolus administration of intravenous contrast. Multiplanar reconstructed images and MIPs were obtained and reviewed to evaluate the vascular anatomy. CONTRAST:  170m OMNIPAQUE IOHEXOL 350 MG/ML SOLN COMPARISON:  CT abdomen and pelvis 01/11/2020 FINDINGS: VASCULAR Aorta: Small amount of atherosclerotic disease in the infrarenal abdominal aorta without dissection or aneurysm. No aortic stenosis. Celiac: Celiac trunk is widely patent. Left gastric artery and splenic artery are patent. No evidence for aneurysm or pseudoaneurysm involving the splenic artery. Common hepatic artery is tortuous with evidence for embolization of the gastroduodenal artery. Irregular of the hepatic arteries. Multiple small collateral vessels around the GDA distribution. SMA: Patent without  evidence of aneurysm, dissection, vasculitis or significant stenosis. Renals: Both renal arteries are patent without evidence of aneurysm, dissection, vasculitis, fibromuscular dysplasia or significant stenosis. IMA: Patent without evidence of aneurysm, dissection, vasculitis or significant stenosis. Inflow: Patent without evidence of aneurysm, dissection, vasculitis or significant stenosis. Proximal Outflow: Proximal femoral arteries are patent bilaterally. Veins: Marked irregularity at the portal vein confluence. The main portal vein and intrahepatic  portal veins are patent but there appears to be marked narrowing and irregularity of the portal confluence near the SMV. 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 IVC and iliac veins are patent. Bilateral renal veins appear to be patent. Review of the MIP images confirms the above findings. NON-VASCULAR Lower chest: Small bilateral pleural effusions, left side greater than right. Stable 3 mm nodule in the right middle lobe on sequence 6, image 3. This nodule has been stable since 12/02/2013 and compatible with a benign nodule. Compressive atelectasis adjacent to the small effusions. Hepatobiliary: Small amount of perihepatic ascites. Cholecystectomy. No significant biliary dilatation. Pancreas: There may be a small amount of residual pancreatic parenchyma near the head and uncinate process but very little pancreatic tissue is identified and compatible with history of necrotizing pancreatitis. 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 The large pancreatic pseudocyst collection has been decompressed. There is a small amount of residual fluid in the expected location of the pancreatic tail on sequence 16, image 30. This collection measures roughly 4.3 x 3.1 x 3.2 cm. There is a small amount of residual fluid around the stent within the central abdomen and this small collection is poorly characterized but has a maximum dimension of roughly 2.6 cm on sequence 19, image 60. Spleen: Small amount of perisplenic ascites. Adrenals/Urinary Tract: Normal appearance of the adrenal glands. Hyperdense exophytic structure in the left kidney lower pole that measures up to 2.1 cm and measured 1.7 cm on 01/11/2020. Hounsfield units do not significantly change on the post contrast images and likely  represents a proteinaceous or hemorrhagic cyst with interval enlargement. Additional small left renal cysts. Tiny right renal cysts. No hydronephrosis. Urinary bladder is unremarkable. No definite renal calculi. Additional hyperdense structures or calcifications in left kidney lower pole that are not compatible with stones. Stomach/Bowel: Interval placement of a cystogastrostomy stent extending from the stomach body into the central pancreatic pseudocyst. No clear evidence for active GI bleeding within the stomach. Patient now has a feeding jejunostomy tube. No evidence for active GI bleeding or contrast extravasation into the GI tract. Extensive mucosal enhancement involving the sigmoid colon and rectum. No evidence for bowel obstruction. Lymphatic: No significant lymph node enlargement in the abdomen or pelvis. Reproductive: Status post hysterectomy. No adnexal masses. Other: Small amount of ascites in the right lower quadrant. Small amount of perihepatic and perisplenic ascites. Ascites volume has markedly decreased since 01/11/2020. Musculoskeletal: No acute bone abnormality. IMPRESSION: VASCULAR 1. No evidence for active GI bleeding. No evidence for aneurysm or pseudoaneurysm to explain GI bleeding. 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 Main portal veins and superior mesenteric vein are patent. 4. Varices and collaterals around the stomach are related to the splenic vein occlusion. 5.  Aortic Atherosclerosis (ICD10-I70.0). NON-VASCULAR 1. 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  Small residual collection in the expected region of the pancreatic tail. 2. Small amount of ascites. 3. Bilateral renal cysts. Evidence for a hemorrhagic or proteinaceous cyst in left kidney lower pole as described. Additional hyperdense small structures in left kidney lower pole. No suspicious renal lesions. 4. Interval  placement of a jejunal feeding tube. 5. Small bilateral pleural effusions. Electronically Signed   By: Markus Daft M.D.   On: 04/24/2020 12:23     Scheduled Meds: . Chlorhexidine Gluconate Cloth  6 each Topical Daily  . diphenoxylate-atropine  2 tablet Oral QID  . insulin aspart  0-15 Units Subcutaneous Q4H  . insulin glargine  12 Units Subcutaneous Daily  . lidocaine      . metoCLOPramide  10 mg Oral Q6H  . metoprolol tartrate  2.5 mg Intravenous Q6H  . pantoprazole (PROTONIX) IV  40 mg Intravenous Q12H   Continuous Infusions: . dextrose 5 % and 0.9% NaCl 75 mL/hr at 04/24/20 1409     LOS: 2 days    Katherine Jude, MD 04/24/2020 5:41 PM [phone] [facility] If 7PM-7AM, please contact night-coverage 04/24/2020, 5:41 PM

## 2020-04-24 NOTE — Plan of Care (Signed)
Discussed with patient in front of family plan of care for the evening, pain management and ice pack on right knee at bedtime with some teach back displayed.

## 2020-04-24 NOTE — Anesthesia Postprocedure Evaluation (Signed)
Anesthesia Post Note  Patient: Katherine Oliver  Procedure(s) Performed: ESOPHAGOGASTRODUODENOSCOPY (EGD) (N/A )     Patient location during evaluation: Endoscopy Anesthesia Type: MAC Level of consciousness: awake and alert Pain management: pain level controlled Vital Signs Assessment: post-procedure vital signs reviewed and stable Respiratory status: spontaneous breathing, nonlabored ventilation and respiratory function stable Cardiovascular status: stable and blood pressure returned to baseline Postop Assessment: no apparent nausea or vomiting Anesthetic complications: no   No complications documented.  Last Vitals:  Vitals:   04/24/20 1439 04/24/20 1448  BP: (!) 123/50 (!) 145/63  Pulse:  82  Resp: (!) 23 20  Temp: 36.6 C   SpO2: 97% 98%    Last Pain:  Vitals:   04/24/20 1448  TempSrc:   PainSc: 0-No pain                 Andra Matsuo,W. EDMOND

## 2020-04-24 NOTE — Op Note (Signed)
[facility] Patient Name: Katherine Oliver Procedure Date : 04/24/2020 MRN: [id number] Attending MD: Wonda Horner , MD Date of Birth: Jul 27, 1946 CSN: [id number] Age: 74 Admit Type: Inpatient Procedure:                Upper GI endoscopy Indications:              Coffee-ground emesis, Melena Providers:                Wonda Horner, MD, Baird Cancer, RN, Wynonia Sours,                            RN, Cletis Athens, Technician Referring MD:              Medicines:                Propofol per Anesthesia Complications:            No immediate complications. Estimated Blood Loss:     Estimated blood loss: none. Procedure:                Pre-Anesthesia Assessment:                           - Prior to the procedure, a History and Physical                            was performed, and patient medications and                            allergies were reviewed. The patient's tolerance of                            previous anesthesia was also reviewed. The risks                            and benefits of the procedure and the sedation                            options and risks were discussed with the patient.                            All questions were answered, and informed consent                            was obtained. Prior Anticoagulants: The patient has                            taken no previous anticoagulant or antiplatelet                            agents. ASA Grade Assessment: II - A patient with                            mild systemic disease. After reviewing the risks  and benefits, the patient was deemed in                            satisfactory condition to undergo the procedure.                           After obtaining informed consent, the endoscope was                            passed under direct vision. Throughout the                            procedure, the patient's blood pressure, pulse, and                             oxygen saturations were monitored continuously. The                            GIF-H190 ([id number]) Olympus gastroscope was                            introduced through the mouth, and advanced to the                            second part of duodenum. The upper GI endoscopy was                            accomplished without difficulty. The patient                            tolerated the procedure well. Scope In: Scope Out: Findings:      The examined esophagus was normal.      Normal mucosa was found in the stomach. Nothing bleeding      Drainage tube from pancreas into gastric lumen intact.      The examined duodenum was normal. Impression:               - Normal esophagus.                           - Normal mucosa was found in the stomach.                           - Normal examined duodenum.                           - No specimens collected. Recommendation:           - Advance diet as tolerated.                           - Continue present medications. Procedure Code(s):        --- Professional ---                           [phone], Esophagogastroduodenoscopy, flexible,  transoral; diagnostic, including collection of                            specimen(s) by brushing or washing, when performed                            (separate procedure) Diagnosis Code(s):        --- Professional ---                           K92.0, Hematemesis                           K92.1, Melena (includes Hematochezia) CPT copyright 2019 [employer]. All rights reserved. The codes documented in this report are preliminary and upon coder review may  be revised to meet current compliance requirements. Wonda Horner, MD 04/24/2020 2:43:32 PM This report has been signed electronically. Number of Addenda: 0

## 2020-04-24 NOTE — Transfer of Care (Signed)
Immediate Anesthesia Transfer of Care Note  Patient: Katherine Oliver  Procedure(s) Performed: ESOPHAGOGASTRODUODENOSCOPY (EGD) (N/A )  Patient Location: PACU  Anesthesia Type:MAC  Level of Consciousness: awake, oriented and patient cooperative  Airway & Oxygen Therapy: Patient Spontanous Breathing and Patient connected to nasal cannula oxygen  Post-op Assessment: Report given to RN and Post -op Vital signs reviewed and stable  Post vital signs: Reviewed  Last Vitals:  Vitals Value Taken Time  BP    Temp    Pulse 84 04/24/20 1439  Resp 29 04/24/20 1439  SpO2 97 % 04/24/20 1439  Vitals shown include unvalidated device data.  Last Pain:  Vitals:   04/24/20 1326  TempSrc: Oral  PainSc: 0-No pain      Patients Stated Pain Goal: 0 (69/67/89 3810)  Complications: No complications documented.

## 2020-04-24 NOTE — Anesthesia Procedure Notes (Signed)
Procedure Name: MAC Date/Time: 04/24/2020 2:13 PM Performed by: Jenne Campus, CRNA Pre-anesthesia Checklist: Patient identified, Emergency Drugs available, Suction available and Patient being monitored Oxygen Delivery Method: Nasal cannula

## 2020-04-24 NOTE — Anesthesia Preprocedure Evaluation (Addendum)
Anesthesia Evaluation  Patient identified by MRN, date of birth, ID band Patient awake    Reviewed: Allergy & Precautions, H&P , NPO status , Patient's Chart, lab work & pertinent test results  Airway Mallampati: III  TM Distance: >3 FB Neck ROM: Full    Dental no notable dental hx. (+) Teeth Intact, Dental Advisory Given   Pulmonary neg pulmonary ROS,    Pulmonary exam normal breath sounds clear to auscultation       Cardiovascular hypertension, Pt. on medications + dysrhythmias  Rhythm:Regular Rate:Normal     Neuro/Psych negative neurological ROS  negative psych ROS   GI/Hepatic Neg liver ROS, PUD, GERD  ,  Endo/Other  diabetes, Insulin Dependent  Renal/GU negative Renal ROS  negative genitourinary   Musculoskeletal  (+) Arthritis , Osteoarthritis,    Abdominal   Peds  Hematology  (+) Blood dyscrasia, anemia ,   Anesthesia Other Findings   Reproductive/Obstetrics negative OB ROS                            Anesthesia Physical Anesthesia Plan  ASA: III  Anesthesia Plan: MAC   Post-op Pain Management:    Induction: Intravenous  PONV Risk Score and Plan: 2 and Propofol infusion and Treatment may vary due to age or medical condition  Airway Management Planned: Nasal Cannula  Additional Equipment:   Intra-op Plan:   Post-operative Plan:   Informed Consent: I have reviewed the patients History and Physical, chart, labs and discussed the procedure including the risks, benefits and alternatives for the proposed anesthesia with the patient or authorized representative who has indicated his/her understanding and acceptance.     Dental advisory given  Plan Discussed with: CRNA  Anesthesia Plan Comments:         Anesthesia Quick Evaluation

## 2020-04-25 DIAGNOSIS — Z87440 Personal history of urinary (tract) infections: Secondary | ICD-10-CM

## 2020-04-25 DIAGNOSIS — E1136 Type 2 diabetes mellitus with diabetic cataract: Secondary | ICD-10-CM

## 2020-04-25 DIAGNOSIS — K8050 Calculus of bile duct without cholangitis or cholecystitis without obstruction: Secondary | ICD-10-CM | POA: Diagnosis not present

## 2020-04-25 DIAGNOSIS — D631 Anemia in chronic kidney disease: Secondary | ICD-10-CM

## 2020-04-25 DIAGNOSIS — N1831 Chronic kidney disease, stage 3a: Secondary | ICD-10-CM

## 2020-04-25 DIAGNOSIS — Z96651 Presence of right artificial knee joint: Secondary | ICD-10-CM

## 2020-04-25 DIAGNOSIS — I4891 Unspecified atrial fibrillation: Secondary | ICD-10-CM | POA: Diagnosis not present

## 2020-04-25 DIAGNOSIS — K280 Acute gastrojejunal ulcer with hemorrhage: Secondary | ICD-10-CM

## 2020-04-25 DIAGNOSIS — D62 Acute posthemorrhagic anemia: Secondary | ICD-10-CM

## 2020-04-25 DIAGNOSIS — K8591 Acute pancreatitis with uninfected necrosis, unspecified: Secondary | ICD-10-CM | POA: Diagnosis not present

## 2020-04-25 DIAGNOSIS — M6281 Muscle weakness (generalized): Secondary | ICD-10-CM | POA: Diagnosis not present

## 2020-04-25 DIAGNOSIS — Z7984 Long term (current) use of oral hypoglycemic drugs: Secondary | ICD-10-CM

## 2020-04-25 DIAGNOSIS — Z931 Gastrostomy status: Secondary | ICD-10-CM

## 2020-04-25 DIAGNOSIS — I129 Hypertensive chronic kidney disease with stage 1 through stage 4 chronic kidney disease, or unspecified chronic kidney disease: Secondary | ICD-10-CM

## 2020-04-25 DIAGNOSIS — E1122 Type 2 diabetes mellitus with diabetic chronic kidney disease: Secondary | ICD-10-CM

## 2020-04-25 DIAGNOSIS — K7581 Nonalcoholic steatohepatitis (NASH): Secondary | ICD-10-CM

## 2020-04-25 DIAGNOSIS — Z87898 Personal history of other specified conditions: Secondary | ICD-10-CM

## 2020-04-25 DIAGNOSIS — I700 Atherosclerosis of aorta: Secondary | ICD-10-CM

## 2020-04-25 LAB — CBC
HCT: 23.2 % — ABNORMAL LOW (ref 36.0–46.0)
Hemoglobin: 7.4 g/dL — ABNORMAL LOW (ref 12.0–15.0)
MCH: 29.6 pg (ref 26.0–34.0)
MCHC: 31.9 g/dL (ref 30.0–36.0)
MCV: 92.8 fL (ref 80.0–100.0)
Platelets: 146 10*3/uL — ABNORMAL LOW (ref 150–400)
RBC: 2.5 MIL/uL — ABNORMAL LOW (ref 3.87–5.11)
RDW: 18 % — ABNORMAL HIGH (ref 11.5–15.5)
WBC: 5.6 10*3/uL (ref 4.0–10.5)
nRBC: 0.4 % — ABNORMAL HIGH (ref 0.0–0.2)

## 2020-04-25 LAB — DIC (DISSEMINATED INTRAVASCULAR COAGULATION)PANEL
D-Dimer, Quant: 1.7 ug/mL-FEU — ABNORMAL HIGH (ref 0.00–0.50)
Fibrinogen: 325 mg/dL (ref 210–475)
INR: 1.2 (ref 0.8–1.2)
Platelets: 154 10*3/uL (ref 150–400)
Prothrombin Time: 14.6 seconds (ref 11.4–15.2)
Smear Review: NONE SEEN
aPTT: 29 seconds (ref 24–36)

## 2020-04-25 LAB — IRON AND TIBC
Iron: 20 ug/dL — ABNORMAL LOW (ref 28–170)
Saturation Ratios: 10 % — ABNORMAL LOW (ref 10.4–31.8)
TIBC: 202 ug/dL — ABNORMAL LOW (ref 250–450)
UIBC: 182 ug/dL

## 2020-04-25 LAB — BASIC METABOLIC PANEL
Anion gap: 7 (ref 5–15)
BUN: 10 mg/dL (ref 8–23)
CO2: 20 mmol/L — ABNORMAL LOW (ref 22–32)
Calcium: 8.3 mg/dL — ABNORMAL LOW (ref 8.9–10.3)
Chloride: 113 mmol/L — ABNORMAL HIGH (ref 98–111)
Creatinine, Ser: 0.73 mg/dL (ref 0.44–1.00)
GFR calc Af Amer: >60 mL/min (ref >60)
GFR calc non Af Amer: >60 mL/min (ref >60)
Glucose, Bld: 132 mg/dL — ABNORMAL HIGH (ref 70–99)
Potassium: 3.2 mmol/L — ABNORMAL LOW (ref 3.5–5.1)
Sodium: 140 mmol/L (ref 135–145)

## 2020-04-25 LAB — DIRECT ANTIGLOBULIN TEST (NOT AT ARMC)
DAT, IgG: NEGATIVE
DAT, complement: NEGATIVE

## 2020-04-25 LAB — RETICULOCYTES
Immature Retic Fract: 31.2 % — ABNORMAL HIGH (ref 2.3–15.9)
RBC.: 2.66 MIL/uL — ABNORMAL LOW (ref 3.87–5.11)
Retic Count, Absolute: 245.8 10*3/uL — ABNORMAL HIGH (ref 19.0–186.0)
Retic Ct Pct: 9.2 % — ABNORMAL HIGH (ref 0.4–3.1)

## 2020-04-25 LAB — FERRITIN: Ferritin: 261 ng/mL (ref 11–307)

## 2020-04-25 LAB — VITAMIN B12: Vitamin B-12: 773 pg/mL (ref 180–914)

## 2020-04-25 LAB — GLUCOSE, CAPILLARY
Glucose-Capillary: 117 mg/dL — ABNORMAL HIGH (ref 70–99)
Glucose-Capillary: 122 mg/dL — ABNORMAL HIGH (ref 70–99)
Glucose-Capillary: 188 mg/dL — ABNORMAL HIGH (ref 70–99)
Glucose-Capillary: 152 mg/dL — ABNORMAL HIGH (ref 70–99)
Glucose-Capillary: 175 mg/dL — ABNORMAL HIGH (ref 70–99)
Glucose-Capillary: 114 mg/dL — ABNORMAL HIGH (ref 70–99)

## 2020-04-25 LAB — PREPARE RBC (CROSSMATCH)

## 2020-04-25 LAB — MAGNESIUM: Magnesium: 1.1 mg/dL — ABNORMAL LOW (ref 1.7–2.4)

## 2020-04-25 LAB — LACTATE DEHYDROGENASE: LDH: 110 U/L (ref 98–192)

## 2020-04-25 LAB — FOLATE: Folate: 13 ng/mL (ref >5.9)

## 2020-04-25 MED ORDER — POTASSIUM CHLORIDE CRYS ER 20 MEQ PO TBCR
40.0000 meq | EXTENDED_RELEASE_TABLET | Freq: Once | ORAL | Status: AC
  Administered 2020-04-25: 40 meq via ORAL
  Filled 2020-04-25: qty 2

## 2020-04-25 MED ORDER — MAGNESIUM SULFATE 4 GM/100ML IV SOLN
4.0000 g | Freq: Once | INTRAVENOUS | Status: AC
  Administered 2020-04-25: 4 g via INTRAVENOUS
  Filled 2020-04-25: qty 100

## 2020-04-25 MED ORDER — SODIUM CHLORIDE 0.9% IV SOLUTION: Freq: Once | INTRAVENOUS | Status: AC

## 2020-04-25 NOTE — Consult Note (Signed)
CARDIOLOGY CONSULT NOTE  Patient ID: Katherine Oliver MRN: [id number] DOB/AGE: Mar 07, 1946 74 y.o.  Admit date: 04/22/2020 Referring Physician: Georgette Shell, MD Primary Physician:  [name], MD Reason for Consultation: Paroxysmal atrial fibrillation, anticoagulation recommendation in the setting of symptomatic & severe anemia.  HPI:  Katherine Oliver is a 74 y.o. 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  Patient is currently accompanied by her husband [name] and son Marcello Moores at the time of the evaluation.  Review of electronic medical records: Patient was hospitalized from December 13, 2019 - February 24, 2020 for gallstone pancreatitis she underwent ERCP which showed biliary sludge with successful placement of biliary stent.  During that episode patient developed acute kidney injury requiring CRRT and also developed A. fib with rapid ventricular rate.  During the hospitalization patient continued to have worsening abdominal pain and distention and was diagnosed with new peripancreatic fluid collection was transferred to [facility] for further evaluation and management. She underwent cystogastrostomy placement and double pigtail stents endoscopic necrosectomy and percutaneous drainage of the cyst. During the hospitalization patient also developed gastroduodenal artery bleeding and required embolization.  In addition, patient was diagnosed with DVT and placed on Lovenox.  She was later discharged to skilled nursing facility for the period of February 24, 2020 through June 17th, 2021.   Upon returning home patient's home  health care nurse who noted the patient appearing more pale and patient noted dark tarry stools as well.  Blood work was performed which noted hemoglobin to be 4.5 g/dL and patient was advised to go to the ER for further evaluation and management.   On ER presentation patient's hemoglobin was 4.4 g/dL and hematocrit 13.6.  Patient was hospitalized for acute GI bleeding and has received transfusion of packed red blood cells.  Since admission patient has undergone upper endoscopy as well as CT angiogram. Lovenox is on hold and cardiology was consulted given her remote history of paroxysmal atrial fibrillation and anticoagulation recommendations.  Patient states that she was recently diagnosed with paroxysmal atrial fibrillation during her hospitalization in February 2021 while she was getting treated for acute pancreatitis and was placed on hemodialysis for short period of time for acute kidney injury.  She only recalls being in atrial fibrillation for short period of time and did not need any intervention such as cardioversion to restore normal sinus rhythm.  Given her high CHA2DS2-VASc score she was recommended to be on anticoagulation.  Since then according to her no recurrence of atrial fibrillation.   ALLERGIES: Allergies  Allergen Reactions  . Azithromycin Itching    Okay if takes benadryl along with it  . Nickel     Reaction to cheap earrings  . Sulfa Antibiotics     Itching.   [name] [Acetic Acid]     Nausea.   . Adhesive [Tape] Rash    Paper tape - blisters    PAST MEDICAL HISTORY: Past Medical History:  Diagnosis Date  . Allergic rhinitis   . Arthritis   . 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 nephropathy ([place])  DSME at [facility] 01/2016   . Frequent epistaxis 05/16/2019   S/p cauterization with resolution 2020  . GERD (gastroesophageal reflux  disease)   . Hepatic steatosis    by abd [place] 05/2012, mild transaminitis - normal iron sat and viral hep panel (2011), stable [place] 2017  . History of chicken pox   . History of measles   . History of recurrent UTIs    on chronic keflex  . HLD (hyperlipidemia)   . HTN (hypertension)   . 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 Kidney cyst, acquired 01/2016   L kidney by [place]  . Kidney stone 01/2016   L kidney by [place]  . Lung nodules 11/2013   overall stable on f/u CT 01/2016  . Osteopenia 06/2013   mild, forearm T -1.1, hip and spine WNL  . Pancreatitis   . Polycythemia    mild, stable (2013)  . Primary localized osteoarthritis of right knee 01/05/2019  . Rosacea    metrogel    PAST SURGICAL HISTORY: Past Surgical History:  Procedure Laterality Date  . APPENDECTOMY  1987  . BILIARY STENT PLACEMENT  12/14/2019   Procedure: BILIARY STENT PLACEMENT;  Surgeon: [name], MD;  Location: [place];  Service: Endoscopy;;  . BREAST BIOPSY Right 1963   benign  . BREAST BIOPSY Right 08/2014   benign- core  . cardiolite stress test  04/2004   normal  . CESAREAN SECTION  2725;3664   x2  . CHOLECYSTECTOMY  2003  . COLONOSCOPY  09/26/2008   adenomatous polyp, rpt 3-5 yrs  . COLONOSCOPY  08/2012   adenomatous polyps, diverticulosis, rec rpt 5 yrs [name])  . COLONOSCOPY WITH PROPOFOL N/A 02/05/2018   4TA, SSA, diverticulosis, rpt 3 yrs [name], [name], MD)  . dexa  2003   normal  . dexa  06/2013   [facility] - Tscore -1.1 forearm, normal spine and femur  . ERCP N/A 12/14/2019   Procedure: ENDOSCOPIC RETROGRADE CHOLANGIOPANCREATOGRAPHY (ERCP);  Surgeon: [name], MD;  Location: [place];  Service: Endoscopy;  Laterality: N/A;  . ERCP  01/2020   nonbleeding gastric ulcer - [facility] hospitalization (Dr Mont Dutton)  . ESOPHAGOGASTRODUODENOSCOPY N/A 12/19/2019   Procedure: ESOPHAGOGASTRODUODENOSCOPY (EGD);  Surgeon: [name], MD;  Location: [facility] ENDOSCOPY;   Service: Endoscopy;  Laterality: N/A;  . ESOPHAGOGASTRODUODENOSCOPY  01/2020   pre existing AXIOS cystogastrostomy stent s/p necrosectomy - [facility] hospitalization (Dr Mont Dutton)  . IR FLUORO GUIDE CV LINE RIGHT  12/27/2019  . IR FLUORO GUIDE CV LINE RIGHT  04/24/2020  . IR REMOVAL TUN CV CATH W/O FL  01/03/2020  . IR REPLC DUODEN/JEJUNO TUBE PERCUT W/FLUORO  04/01/2020  . IR US GUIDE VASC ACCESS RIGHT  04/24/2020  . SPHINCTEROTOMY  12/14/2019   Procedure: SPHINCTEROTOMY;  Surgeon: [name], MD;  Location: [place];  Service: Endoscopy;;  . 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 TRANSTHORACIC ECHOCARDIOGRAM  04/2019   EF 55-60%, modLVH, impaired relaxation   . TRIGGER FINGER RELEASE  2007;2010;2011   bilateral  . TRIGGER FINGER RELEASE  02/2017  . VAGINAL HYSTERECTOMY  1984   for menorrhagia, ovaries in place  J-tube placement.   FAMILY HISTORY: The patient family history includes Cancer in her father and paternal aunt; Coronary artery disease in her maternal grandfather and maternal grandmother; Coronary artery disease (age of onset: 61)  in her father; Diabetes in her maternal grandfather; Hyperlipidemia in her father and mother; Hypertension in her father and mother; Stroke in her mother.   SOCIAL HISTORY:  The patient  reports that she has never smoked. She has never used smokeless tobacco. She reports previous alcohol use of about 1.0 - 2.0 standard drink of alcohol per week. She reports that she does not use drugs.  MEDICATIONS: Medications Prior to Admission  Medication Sig Dispense Refill Last Dose  . acetaminophen (TYLENOL) 325 MG tablet Take 650 mg by mouth every 6 (six) hours as needed for mild pain.   PRN  . conjugated estrogens (PREMARIN) vaginal cream Place 1 Applicatorful vaginally daily. Only to be applied topically (Patient taking  differently: Place 1 Applicatorful vaginally every other day. Only to be applied topically) 42.5 g 12 Past Week at Unknown time  . enoxaparin (LOVENOX) 80 MG/0.8ML injection Inject 0.65 mLs (65 mg total) into the skin 2 (two) times daily. 22.4 mL 0 04/20/2020 at Unknown time  . insulin lispro (HUMALOG) 100 UNIT/ML injection Inject 4-12 Units into the skin 3 (three) times daily before meals. (according to sliding scale - max 36u daily)   Past Week at Unknown time  . LANTUS SOLOSTAR 100 UNIT/ML Solostar Pen Inject 12 Units into the skin at bedtime.   Past Week at Unknown time  . loperamide (IMODIUM A-D) 2 MG tablet Take 2 mg by mouth 4 (four) times daily as needed for diarrhea or loose stools.   Past Week at Unknown time  . meclizine (ANTIVERT) 25 MG tablet Take 1 tablet (25 mg total) by mouth 3 (three) times daily as needed for nausea. 30 tablet 0 Past Week at Unknown time  . metoCLOPramide (REGLAN) 10 MG tablet Take 1 tablet (10 mg total) by mouth every 6 (six) hours. 120 tablet 0 Past Week at Unknown time  . ACCU-CHEK AVIVA PLUS test strip USE AS DIRECTED TO CHECK BLOOD SUGARS ONCE DAILY E11.21 100 strip 7   . acetaminophen (TYLENOL) 160 MG/5ML suspension Place 30.5 mg into feeding tube every 6 (six) hours as needed for mild pain or moderate pain.  (Patient not taking: Reported on 04/23/2020)   Not Taking at Unknown time  . amLODIPine Benzoate 1 MG/ML SUSP 2.5 mLs by Per J Tube route daily in the afternoon. (Patient not taking: Reported on 04/23/2020)   Not Taking at Unknown time  . benazepril (LOTENSIN) 20 MG tablet 20 mg by Per J Tube route daily. (Patient not taking: Reported on 04/23/2020)   Not Taking at Unknown time  . Multiple Vitamin (MULTIVITAMIN) tablet 1 tablet by Per J Tube route daily. (Patient not taking: Reported on 04/23/2020)   Not Taking at Unknown time  . Nutritional Supplements (FEEDING SUPPLEMENT, JEVITY 1.5 CAL/FIBER,) LIQD 1,000 mLs by Per J Tube route continuous. Goal rate 50 cc/hr,  reduce by 10 cc/hr as needed if nausea occurs. (Patient not taking: Reported on 04/23/2020)   Not Taking at Unknown time  . Pancrelipase, Lip-Prot-Amyl, [id number] units TABS 2 tablets by Per J Tube route 3 (three) times daily with meals. (Patient not taking: Reported on 04/23/2020)   Not Taking   Current Outpatient Medications  Medication Instructions  . ACCU-CHEK AVIVA PLUS test strip USE AS DIRECTED TO CHECK BLOOD SUGARS ONCE DAILY E11.21  . acetaminophen (TYLENOL) 30.5 mg, Every 6 hours PRN  . acetaminophen (TYLENOL) 650 mg, Oral, Every 6 hours PRN  . amLODIPine Benzoate 1 MG/ML SUSP 2.5 mLs, Daily  . benazepril (  LOTENSIN) 20 mg, Daily  . conjugated estrogens (PREMARIN) vaginal cream 1 Applicatorful, Vaginal, Daily, Only to be applied topically  . enoxaparin (LOVENOX) 1 mg/kg, Subcutaneous, 2 times daily  . insulin lispro (HUMALOG) 4-12 Units, Subcutaneous, 3 times daily before meals, (according to sliding scale - max 36u daily)  . Lantus SoloStar 12 Units, Subcutaneous, Daily at bedtime  . loperamide (IMODIUM A-D) 2 mg, Oral, 4 times daily PRN  . meclizine (ANTIVERT) 25 mg, Oral, 3 times daily PRN  . metoCLOPramide (REGLAN) 10 mg, Oral, Every 6 hours  . Multiple Vitamin (MULTIVITAMIN) tablet 1 tablet, Daily  . Nutritional Supplements (FEEDING SUPPLEMENT, JEVITY 1.5 CAL/FIBER,) LIQD 1,000 mLs, Per J Tube, Continuous, Goal rate 50 cc/hr, reduce by 10 cc/hr as needed if nausea occurs.  . Pancrelipase, Lip-Prot-Amyl, [id number] units TABS 2 tablets, 3 times daily with meals    Review of Systems  Constitutional: Negative for chills and fever.  HENT: Negative for hoarse voice and nosebleeds.   Eyes: Negative for discharge, double vision and pain.  Cardiovascular: Negative for chest pain, claudication, dyspnea on exertion, leg swelling, near-syncope, orthopnea, palpitations, paroxysmal nocturnal dyspnea and syncope.  Respiratory: Negative for hemoptysis and shortness of breath.    Musculoskeletal: Negative for muscle cramps and myalgias.  Gastrointestinal: Negative for abdominal pain, constipation, diarrhea, hematemesis, hematochezia, melena, nausea and vomiting.  Neurological: Negative for dizziness and light-headedness.  All other systems reviewed and are negative.  PHYSICAL EXAM: Vitals with BMI 04/25/2020 04/25/2020 04/25/2020  Height - - -  Weight - - -  BMI - - -  Systolic 606 301 -  Diastolic 56 61 -  Pulse - 84 73     Intake/Output Summary (Last 24 hours) at 04/25/2020 1800 Last data filed at 04/25/2020 1747 Gross per 24 hour  Intake 2332.41 ml  Output 800 ml  Net 1532.41 ml    Net IO Since Admission: 2,563.77 mL [04/25/20 1800] CONSTITUTIONAL: Appears older than stated age, pale appearing, hemodynamically stable, no acute distress. SKIN: Skin is warm and dry. No rash noted. No cyanosis. No pallor. No jaundice HEAD: Normocephalic and atraumatic.  EYES: No scleral icterus MOUTH/THROAT: Dry oral membranes.  NECK: No JVD present.  Trachea midline. LYMPHATIC: No visible cervical adenopathy.  CHEST Normal respiratory effort. No intercostal retractions.  PICC line in place LUNGS: Decreased breath sounds at the bases.  No stridor. No wheezes. No rales.  CARDIOVASCULAR: Regular, positive S1-S2, no murmurs rubs or gallops appreciated. ABDOMINAL: Soft, nontender, distended, J-tube in place, positive bowel sounds in all 4 quadrants. EXTREMITIES: No peripheral edema  HEMATOLOGIC: No significant bruising NEUROLOGIC: Oriented to person, place, and time. Nonfocal. Normal muscle tone.  PSYCHIATRIC: Normal mood and affect. Normal behavior. Cooperative  RADIOLOGY: IR Fluoro Guide CV Line Right  Result Date: 04/24/2020 INDICATION: GI BLEED, ACUTE BLOOD LOSS ANEMIA, POOR PERIPHERAL ACCESS EXAM: TUNNELED PICC LINE WITH ULTRASOUND AND FLUOROSCOPIC GUIDANCE MEDICATIONS: 1% lidocaine local. ANESTHESIA/SEDATION: Moderate Sedation Time:  None. The patient was  continuously monitored during the procedure by the interventional radiology nurse under my direct supervision. FLUOROSCOPY TIME:  Fluoroscopy Time: 0 minutes 18 seconds (1 mGy). COMPLICATIONS: None immediate. PROCEDURE: Informed written consent was obtained from the patient after a discussion of the risks, benefits, and alternatives to treatment. Questions regarding the procedure were encouraged and answered. The right neck and chest were prepped with chlorhexidine in a sterile fashion, and a sterile drape was applied covering the operative field. Maximum barrier sterile technique with sterile gowns and gloves were used for  the procedure. A timeout was performed prior to the initiation of the procedure. After creating a small venotomy incision, a micropuncture kit was utilized to access the right internal jugular vein under direct, real-time ultrasound guidance after the overlying soft tissues were anesthetized with 1% lidocaine with epinephrine. Ultrasound image documentation was performed. The microwire was kinked to measure appropriate catheter length. The micropuncture sheath was exchanged for a peel-away sheath over a guidewire. A 5 French dual lumen tunneled PICC measuring 27 cm was tunneled in a retrograde fashion from the anterior chest wall to the venotomy incision. The catheter was then placed through the peel-away sheath with tip ultimately positioned at the superior caval-atrial junction. Final catheter positioning was confirmed and documented with a spot radiographic image. The catheter aspirates and flushes normally. The catheter was flushed with appropriate volume heparin dwells. The catheter exit site was secured with a 2-0 Ethilon suture. The venotomy incision was closed with Dermabond. Dressings were applied. The patient tolerated the procedure well without immediate post procedural complication. FINDINGS: After catheter placement, the tip lies within the superior cavoatrial junction. The catheter  aspirates and flushes normally and is ready for immediate use. IMPRESSION: Successful placement of dual lumen tunneled PICC catheter via the right internal jugular vein with tip terminating at the superior caval atrial junction. The catheter is ready for immediate use. Electronically Signed   By: Jerilynn Mages.  Shick M.D.   On: 04/24/2020 10:36   IR US Guide Vasc Access Right  Result Date: 04/24/2020 INDICATION: GI BLEED, ACUTE BLOOD LOSS ANEMIA, POOR PERIPHERAL ACCESS EXAM: TUNNELED PICC LINE WITH ULTRASOUND AND FLUOROSCOPIC GUIDANCE MEDICATIONS: 1% lidocaine local. ANESTHESIA/SEDATION: Moderate Sedation Time:  None. The patient was continuously monitored during the procedure by the interventional radiology nurse under my direct supervision. FLUOROSCOPY TIME:  Fluoroscopy Time: 0 minutes 18 seconds (1 mGy). COMPLICATIONS: None immediate. PROCEDURE: Informed written consent was obtained from the patient after a discussion of the risks, benefits, and alternatives to treatment. Questions regarding the procedure were encouraged and answered. The right neck and chest were prepped with chlorhexidine in a sterile fashion, and a sterile drape was applied covering the operative field. Maximum barrier sterile technique with sterile gowns and gloves were used for the procedure. A timeout was performed prior to the initiation of the procedure. After creating a small venotomy incision, a micropuncture kit was utilized to access the right internal jugular vein under direct, real-time ultrasound guidance after the overlying soft tissues were anesthetized with 1% lidocaine with epinephrine. Ultrasound image documentation was performed. The microwire was kinked to measure appropriate catheter length. The micropuncture sheath was exchanged for a peel-away sheath over a guidewire. A 5 French dual lumen tunneled PICC measuring 27 cm was tunneled in a retrograde fashion from the anterior chest wall to the venotomy incision. The catheter was  then placed through the peel-away sheath with tip ultimately positioned at the superior caval-atrial junction. Final catheter positioning was confirmed and documented with a spot radiographic image. The catheter aspirates and flushes normally. The catheter was flushed with appropriate volume heparin dwells. The catheter exit site was secured with a 2-0 Ethilon suture. The venotomy incision was closed with Dermabond. Dressings were applied. The patient tolerated the procedure well without immediate post procedural complication. FINDINGS: After catheter placement, the tip lies within the superior cavoatrial junction. The catheter aspirates and flushes normally and is ready for immediate use. IMPRESSION: Successful placement of dual lumen tunneled PICC catheter via the right internal jugular vein with tip terminating  at the superior caval atrial junction. The catheter is ready for immediate use. Electronically Signed   By: Jerilynn Mages.  Shick M.D.   On: 04/24/2020 10:36   CT Angio Abd/Pel w/ and/or w/o  Result Date: 04/24/2020 CLINICAL DATA:  74 year old with history of necrotizing pancreatitis and intermittent coffee ground emesis and melena. History of pancreatic necrosectomy. EXAM: CT ANGIOGRAPHY ABDOMEN AND PELVIS WITH CONTRAST AND WITHOUT CONTRAST TECHNIQUE: Multidetector CT imaging of the abdomen and pelvis was performed using the standard protocol during bolus administration of intravenous contrast. Multiplanar reconstructed images and MIPs were obtained and reviewed to evaluate the vascular anatomy. CONTRAST:  13m OMNIPAQUE IOHEXOL 350 MG/ML SOLN COMPARISON:  CT abdomen and pelvis 01/11/2020 FINDINGS: VASCULAR Aorta: Small amount of atherosclerotic disease in the infrarenal abdominal aorta without dissection or aneurysm. No aortic stenosis. Celiac: Celiac trunk is widely patent. Left gastric artery and splenic artery are patent. No evidence for aneurysm or pseudoaneurysm involving the splenic artery. Common hepatic  artery is tortuous with evidence for embolization of the gastroduodenal artery. Irregular of the hepatic arteries. Multiple small collateral vessels around the GDA distribution. SMA: Patent without evidence of aneurysm, dissection, vasculitis or significant stenosis. Renals: Both renal arteries are patent without evidence of aneurysm, dissection, vasculitis, fibromuscular dysplasia or significant stenosis. IMA: Patent without evidence of aneurysm, dissection, vasculitis or significant stenosis. Inflow: Patent without evidence of aneurysm, dissection, vasculitis or significant stenosis. Proximal Outflow: Proximal femoral arteries are patent bilaterally. Veins: Marked irregularity at the portal vein confluence. The main portal vein and intrahepatic portal veins are patent but there appears to be marked narrowing and irregularity of the portal confluence near the SMV. 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 IVC and iliac veins are patent. Bilateral renal veins appear to be patent. Review of the MIP images confirms the above findings. NON-VASCULAR Lower chest: Small bilateral pleural effusions, left side greater than right. Stable 3 mm nodule in the right middle lobe on sequence 6, image 3. This nodule has been stable since 12/02/2013 and compatible with a benign nodule. Compressive atelectasis adjacent to the small effusions. Hepatobiliary: Small amount of perihepatic ascites. Cholecystectomy. No significant biliary dilatation. Pancreas: There may be a small amount of residual pancreatic parenchyma near the head and uncinate process but very little pancreatic tissue is identified and compatible with history of necrotizing pancreatitis. 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 The  large pancreatic pseudocyst collection has been decompressed. There is a small amount of residual fluid in the expected location of the pancreatic tail on sequence 16, image 30. This collection measures roughly 4.3 x 3.1 x 3.2 cm. There is a small amount of residual fluid around the stent within the central abdomen and this small collection is poorly characterized but has a maximum dimension of roughly 2.6 cm on sequence 19, image 60. Spleen: Small amount of perisplenic ascites. Adrenals/Urinary Tract: Normal appearance of the adrenal glands. Hyperdense exophytic structure in the left kidney lower pole that measures up to 2.1 cm and measured 1.7 cm on 01/11/2020. Hounsfield units do not significantly change on the post contrast images and likely represents a proteinaceous or hemorrhagic cyst with interval enlargement. Additional small left renal cysts. Tiny right renal cysts. No hydronephrosis. Urinary bladder is unremarkable. No definite renal calculi. Additional hyperdense structures or calcifications in left  kidney lower pole that are not compatible with stones. Stomach/Bowel: Interval placement of a cystogastrostomy stent extending from the stomach body into the central pancreatic pseudocyst. No clear evidence for active GI bleeding within the stomach. Patient now has a feeding jejunostomy tube. No evidence for active GI bleeding or contrast extravasation into the GI tract. Extensive mucosal enhancement involving the sigmoid colon and rectum. No evidence for bowel obstruction. Lymphatic: No significant lymph node enlargement in the abdomen or pelvis. Reproductive: Status post hysterectomy. No adnexal masses. Other: Small amount of ascites in the right lower quadrant. Small amount of perihepatic and perisplenic ascites. Ascites volume has markedly decreased since 01/11/2020. Musculoskeletal: No acute bone abnormality. IMPRESSION: VASCULAR 1. No evidence for active GI bleeding. No evidence for aneurysm or  pseudoaneurysm to explain GI bleeding. 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 Main portal veins and superior mesenteric vein are patent. 4. Varices and collaterals around the stomach are related to the splenic vein occlusion. 5.  Aortic Atherosclerosis (ICD10-I70.0). NON-VASCULAR 1. 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 Small residual collection in the expected region of the pancreatic tail. 2. Small amount of ascites. 3. Bilateral renal cysts. Evidence for a hemorrhagic or proteinaceous cyst in left kidney lower pole as described. Additional hyperdense small structures in left kidney lower pole. No suspicious renal lesions. 4. Interval placement of a jejunal feeding tube. 5. Small bilateral pleural effusions. Electronically Signed   By: Markus Daft M.D.   On: 04/24/2020 12:23    LABORATORY DATA: Lab Results  Component Value Date   WBC 5.6 04/25/2020   HGB 7.4 (L) 04/25/2020   HCT 23.2 (L) 04/25/2020   MCV 92.8 04/25/2020   PLT 154 04/25/2020    Recent Labs  Lab 04/22/20 1141 04/22/20 1656 04/25/20 0502  NA 129*   < > 140  K 4.2   < > 3.2*  CL 97*   < > 113*  CO2 16*   < > 20*  BUN 31*   < > 10  CREATININE 1.05*   < > 0.73  CALCIUM 9.4   < > 8.3*  PROT 5.9*  --   --   BILITOT 1.1  --   --   ALKPHOS 71  --   --   ALT 10  --   --   AST 11*  --   --   GLUCOSE 502*   < > 132*   < > = values in this interval not displayed.    Lipid Panel     Component Value Date/Time   CHOL 160 12/13/2019 2230   TRIG 169 (H) 12/13/2019 2230   HDL 52 12/13/2019 2230   CHOLHDL 3.1 12/13/2019 2230   VLDL 34 12/13/2019 2230   LDLCALC 74 12/13/2019 2230    BNP (last 3 results) Recent Labs    12/15/19 0900  BNP 65.1    HEMOGLOBIN A1C Lab Results  Component Value Date   HGBA1C 6.2 (H) 03/06/2020   MPG 131.24 03/06/2020    Cardiac Panel (last 3 results) Recent Labs     12/14/19 0215  CKTOTAL 38    Lab Results  Component Value Date   CKTOTAL 38 12/14/2019     TSH No results for input(s): TSH in the last 8760 hours.   Scheduled Meds: . Chlorhexidine Gluconate Cloth  6 each Topical Daily  . diphenoxylate-atropine  2 tablet Oral QID  . insulin aspart  0-15 Units Subcutaneous Q4H  . insulin aspart  4 Units Subcutaneous TID WC  . insulin glargine  12 Units Subcutaneous Daily  . metoCLOPramide  10 mg Oral Q6H  . metoprolol tartrate  2.5 mg Intravenous Q6H  . pantoprazole (PROTONIX) IV  40 mg Intravenous Q12H   Continuous Infusions: PRN Meds:.acetaminophen, dextrose, hydrALAZINE, loperamide  CARDIAC DATABASE: EKG: December 16, 2019: Atrial fibrillation with rapid ventricular rate, 132 bpm, left axis deviation, nonspecific ST-T changes.  04/22/2020: Sinus tachycardia, rate of 16 bpm, borderline left axis deviation, poor R wave progression, without underlying ischemia or injury pattern.  Echocardiogram: 05/04/2019: LVEF 55-60%, moderate LVH, without any significant valvular heart disease.  Stress Testing:  10-15 years ago; no records available for review.   Heart Catheterization: None  IMPRESSION & RECOMMENDATIONS: Katesha Eichel is a 74 y.o. 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  Paroxysmal atrial fibrillation: Currently normal sinus  Discovered during her hospitalization in February 2021 while being treated for acute pancreatitis with complications and acute kidney injury requiring hemodialysis.  She converted to normal sinus rhythm spontaneously without needing intervention per patient.  Currently her ventricular rate is well controlled.  And LVEF per last echocardiogram is preserved.  According to  patient, no recurrence of paroxysmal atrial fibrillation while she was at [facility] or SNF.  Given the recent acute blood loss with hemoglobin on presentation to be 4.4 g/dL cardiology was consulted for recommendations on oral anticoagulation in the setting of symptomatic anemia, possible GI bleed, and acute blood loss.  CHA2DS2-VASc SCORE is 6 which correlates to 9.8 % risk of stroke per year (hypertension, age 39, diabetes, aortic atherosclerosis on CT, female gender)  Has bled score 2, intermediate bleeding risk.  Patient is currently getting her third unit of packed red blood cells.  Discussed risks, benefits, and alternatives to oral anticoagulation.  In the setting of acute blood loss anemia with a hemoglobin of 4.4 g/dL on admission the shared decision (among patient, husband, and son Maisie Fus) was to hold oral anticoagulation for her underlying paroxysmal atrial fibrillation.  Patient can be reconsidered for oral anticoagulation if her hemoglobin remains stable and underlying cause of bleeding is addressed.  During the time patient is not on oral anticoagulation she understands that she is at a higher risk of thromboembolic event.  She is educated on symptoms of stroke and to seek medical attention at the closest ER via EMS if such symptoms arise.  Patient states that she is very familiar with symptoms of stroke as her husband had a CAV in the past.  We discussed considering outpatient mobile cardiac ambulatory telemetry to evaluate A. fib burden and also being considered for watchman device (left atrial appendage closure device) given her high chads vas score, current episode of symptomatic acute blood loss anemia with hemoglobin of 4.4 g/dL on admission requiring blood transfusions.  Being considered for watchman device should be done as outpatient as patient may require short course of oral anticoagulation postprocedure.  Patient, husband, and son agreeable with the plan of care as  discussed above.  We will follow the patient peripherally during this hospitalization and she is encouraged to follow-up outpatient once discharged.  Plan discussed with attending physician over the phone.  Secondary diagnoses: Acute blood loss anemia: Work-up currently ongoing as per primary team. Insulin-dependent diabetes mellitus type 2:  Currently managed by primary team. 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  *Total Encounter Time as defined by the Centers for Medicare and Medicaid Services includes, in addition to the face-to-face time of a patient visit (documented in the note above) non-face-to-face time: obtaining and reviewing prior history, reviewing medications, tests or procedures, care coordination (communications with other health care professionals or caregivers), complex medical decision making in regards to oral anticoagulation in the setting of acute blood loss anemia and history of paroxysmal atrial fibrillation, and documentation in the medical record.  Patient's questions and concerns were addressed to her satisfaction. She voices understanding of the instructions provided during this encounter.   This note was created using a voice recognition software as a result there may be grammatical errors inadvertently enclosed that do not reflect the nature of this encounter. Every attempt is made to correct such errors.  Rex Kras, DO, [facility]. [place] Office: [phone] 04/25/2020, 6:00 PM

## 2020-04-25 NOTE — Progress Notes (Addendum)
PROGRESS NOTE    Katherine Oliver  VQM:[id number] DOB: 18-Jul-1946 DOA: 04/22/2020 PCP: [name], MD  Brief Narrative:Katherine Oliver a 74 y.o.femalewithhistory of diabetes mellitus type 2 hypertension A. fib DVT recently admitted from December 13, 2019 up to February 24, 2020 for acute pancreatitis and complications. Patient was initially admitted on December 13, 2019 for presumed gallstone pancreatitis patient had undergone ERCP which showed biliary sludge with successful placement of biliary stent. However patient developed acute renal failure requiring CRRT. Patient also developed A. fib with RVR. Since patient started having worsening abdominal pain nausea and distention with evidence of new peripancreatic fluid collection despite CBD stent patient was transferred to [facility] for further care where patient underwent cystogastrostomy placement and double pigtail stents endoscopic necrosectomy and percutaneous drainage of the cyst. During the hospitalization patient also developed gastroduodenal artery bleeding and required embolization. Patient also was started on Lovenox for DVT. Patient was discharged to [facility] on February 24, 2020. With drains in place. Patient was discharged from the SNF on April 19, 2020 about 4 days ago and the home health nurse noticed that patient has been pale looking. Patient states she also has been having some nausea vomiting with dark stools for the last 2 weeks. Patient has a gastrostomy tube which only patient states she takes certain medications but most of the feeds he is taking orally for the last 2 weeks. Denies taking any NSAIDs. Patient had followed up with her PCP and labs showed hemoglobin to be around 4 was referred to the ER. In the ER patient's labs showed hemoglobin of around 4.4 and complete metabolic panel showed glucose of 502 with anion gap of 16 bicarb of 16. Patient was started on IV insulin infusion  for DKA 2 units of PRBC transfusion was ordered for acute GI bleed and patient was transferred to [facility] for the need for possible IR embolization if needed. On exam patient abdomen appears benign. Denies chest pain or shortness of breath. Assessment & Plan:   Principal Problem:   Acute GI bleeding Active Problems:   NAFLD (nonalcoholic fatty liver disease)   AF (paroxysmal atrial fibrillation) (HCC)   Chronic pancreatitis ([place])   Gastrostomy in place ([place])   Acute blood loss anemia   DKA (diabetic ketoacidoses) ([place])    #1 GI bleed ruled out-patient admitted with anemia with history of recent gastroduodenal artery bleeding at [place] requiring embolization.  Received 2 units of packed RBC and was started on IV Protonix.  CT angiogram showed no evidence of bleeding. EGD 04/24/2020 no active source of bleeding noted. Hemoglobin still low at 7.4 will transfuse another unit today. 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 CBG (last 3)  Recent Labs    04/25/20 0124 04/25/20 0322 04/25/20 0746  GLUCAP 117* 122* 188*   #3 history of paroxysmal atrial fibrillation chronic rate controlled on metoprolol not on anticoagulation due to bleed .Marland Kitchen  #4 essential hypertension blood pressure 120/61 on metoprolol and hydralazine as needed   #5 hypokalemia/hypomagnesemia replete potassium and magnesium recheck labs in a.m.    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   Weight as of this encounter: 63.4 kg.  DVT prophylaxis:  SCD  code Status: Full code Family Communication: None at bedside  disposition Plan:  Status is: Inpatient  Dispo: The patient is from: Home              Anticipated d/c is to unknown  physical therapy evaluation pending.              Anticipated d/c date is: Unknown              Patient currently is not medically stable to d/c.  Patient is getting blood transfusion today.   Consultants: GI interventional radiology  Procedures: EGD 04/24/2020 Antimicrobials: None Subjective: She is awake alert sitting up in bed eating breakfast denies any specific complaints no nausea vomiting able to tolerate p.o. intake no diarrhea or abdominal pain reported  Objective: Vitals:   04/25/20 0446 04/25/20 0500 04/25/20 0600 04/25/20 0655  BP: 124/61   120/61  Pulse: 64 63 73 84  Resp:      Temp:      TempSrc:      SpO2: 97% 97% 97% 96%  Weight:      Height:        Intake/Output Summary (Last 24 hours) at 04/25/2020 1149 Last data filed at 04/25/2020 0748 Gross per 24 hour  Intake 1988.89 ml  Output 400 ml  Net 1588.89 ml   Filed Weights   04/23/20 2311  Weight: 63.4 kg    Examination:  General exam: Appears calm and comfortable  Respiratory system: Clear to auscultation. Respiratory effort normal. Cardiovascular system: S1 & S2 heard, RRR. No JVD, murmurs, rubs, gallops or clicks. No pedal edema. Gastrointestinal system: Abdomen is nondistended, soft and nontender. No organomegaly or masses felt. Normal bowel sounds heard. Central nervous system: Alert and oriented. No focal neurological deficits.g tube in place Extremities: Symmetric 5 x 5 power. Skin: No rashes, lesions or ulcers Psychiatry: Judgement and insight appear normal. Mood & affect appropriate.     Data Reviewed: I have personally reviewed following labs and imaging studies  CBC: Recent Labs  Lab 04/22/20 1141 04/22/20 1141 04/22/20 2357 04/23/20 0350 04/23/20 0738 04/24/20 0900 04/25/20 0502  WBC 9.7   < > 9.7 8.9 9.4 7.4 5.6  NEUTROABS 8.0*  --   --   --   --   --   --   HGB 4.4*   < > 7.5* 7.4* 7.8* 8.1* 7.4*  HCT 13.6*   < > 22.8* 22.5* 23.7* 25.0* 23.2*  MCV 86.6   < > 88.7 89.6 91.2  91.2 92.8  PLT 188   < > 176 170 174 139* 146*   < > = values in this interval not displayed.   Basic Metabolic Panel: Recent Labs  Lab 04/22/20 1141 04/22/20 1656 04/22/20 2357 04/24/20 0358 04/25/20 0502  NA 129* 133* 139 139 140  K 4.2 4.1 4.0 3.6 3.2*  CL 97* 104 108 114* 113*  CO2 16* 19* 21* 18* 20*  GLUCOSE 502* 420* 140* 203* 132*  BUN 31* 25* '21 14 10  '$ CREATININE 1.05* 0.93 0.79 0.76 0.73  CALCIUM 9.4 8.8* 9.4 8.6* 8.3*   GFR: Estimated Creatinine Clearance: 50.5 mL/min (by C-G formula based on SCr of 0.73 mg/dL). Liver Function Tests: Recent Labs  Lab 04/22/20 1141  AST 11*  ALT 10  ALKPHOS 71  BILITOT 1.1  PROT 5.9*  ALBUMIN 3.2*   Recent Labs  Lab 04/22/20 1142  LIPASE 38   No results for input(s): AMMONIA in the last 168 hours.  Coagulation Profile: Recent Labs  Lab 04/22/20 1141  INR 1.2   Cardiac Enzymes: No results for input(s): CKTOTAL, CKMB, CKMBINDEX, TROPONINI in the last 168 hours. BNP (last 3 results) No results for input(s): PROBNP in the last 8760 hours. HbA1C: No results for input(s): HGBA1C in the last 72 hours. CBG: Recent Labs  Lab 04/24/20 1703 04/24/20 2104 04/25/20 0124 04/25/20 0322 04/25/20 0746  GLUCAP 207* 218* 117* 122* 188*   Lipid Profile: No results for input(s): CHOL, HDL, LDLCALC, TRIG, CHOLHDL, LDLDIRECT in the last 72 hours. Thyroid Function Tests: No results for input(s): TSH, T4TOTAL, FREET4, T3FREE, THYROIDAB in the last 72 hours. Anemia Panel: No results for input(s): VITAMINB12, FOLATE, FERRITIN, TIBC, IRON, RETICCTPCT in the last 72 hours. Sepsis Labs: No results for input(s): PROCALCITON, LATICACIDVEN in the last 168 hours.  Recent Results (from the past 240 hour(s))  SARS Coronavirus 2 by RT PCR (hospital order, performed in [facility] hospital lab) Nasopharyngeal Nasopharyngeal Swab     Status: None   Collection Time: 04/22/20 12:02 PM   Specimen: Nasopharyngeal Swab  Result Value Ref Range  Status   SARS Coronavirus 2 NEGATIVE NEGATIVE Final    Comment: (NOTE) SARS-CoV-2 target nucleic acids are NOT DETECTED.  The SARS-CoV-2 RNA is generally detectable in upper and lower respiratory specimens during the acute phase of infection. The lowest concentration of SARS-CoV-2 viral copies this assay can detect is 250 copies / mL. A negative result does not preclude SARS-CoV-2 infection and should not be used as the sole basis for treatment or other patient management decisions.  A negative result may occur with improper specimen collection / handling, submission of specimen other than nasopharyngeal swab, presence of viral mutation(s) within the areas targeted by this assay, and inadequate number of viral copies (<250 copies / mL). A negative result must be combined with clinical observations, patient history, and epidemiological information.  Fact Sheet for Patients:   StrictlyIdeas.no  Fact Sheet for Healthcare Providers: BankingDealers.co.za  This test is not yet approved or  cleared by the [place] FDA and has been authorized for detection and/or diagnosis of SARS-CoV-2 by FDA under an Emergency Use Authorization (EUA).  This EUA will remain in effect (meaning this test can be used) for the duration of the COVID-19 declaration under Section 564(b)(1) of the Act, 21 U.S.C. section 360bbb-3(b)(1), unless the authorization is terminated or revoked sooner.  Performed at [facility], [address] [place], [place] [address]          Radiology Studies: IR Fluoro Guide CV Line Right  Result Date: 04/24/2020 INDICATION: GI BLEED, ACUTE BLOOD LOSS ANEMIA, POOR PERIPHERAL ACCESS EXAM: TUNNELED PICC LINE WITH ULTRASOUND AND FLUOROSCOPIC GUIDANCE MEDICATIONS: 1% lidocaine local. ANESTHESIA/SEDATION: Moderate Sedation Time:  None. The patient was continuously monitored during the procedure by the interventional  radiology nurse under my direct supervision. FLUOROSCOPY TIME:  Fluoroscopy Time: 0 minutes 18 seconds (1 mGy). COMPLICATIONS: None immediate. PROCEDURE: Informed written consent was obtained from the patient after a discussion of the risks, benefits, and alternatives to treatment. Questions regarding the procedure were encouraged and answered. The right neck and chest were prepped with chlorhexidine in a sterile fashion, and a sterile drape was applied covering the operative field. Maximum barrier sterile technique with sterile gowns and gloves were used for the procedure. A timeout was performed prior to the initiation of the procedure. After creating a small venotomy incision, a micropuncture kit was utilized to access the right internal jugular vein  under direct, real-time ultrasound guidance after the overlying soft tissues were anesthetized with 1% lidocaine with epinephrine. Ultrasound image documentation was performed. The microwire was kinked to measure appropriate catheter length. The micropuncture sheath was exchanged for a peel-away sheath over a guidewire. A 5 French dual lumen tunneled PICC measuring 27 cm was tunneled in a retrograde fashion from the anterior chest wall to the venotomy incision. The catheter was then placed through the peel-away sheath with tip ultimately positioned at the superior caval-atrial junction. Final catheter positioning was confirmed and documented with a spot radiographic image. The catheter aspirates and flushes normally. The catheter was flushed with appropriate volume heparin dwells. The catheter exit site was secured with a 2-0 Ethilon suture. The venotomy incision was closed with Dermabond. Dressings were applied. The patient tolerated the procedure well without immediate post procedural complication. FINDINGS: After catheter placement, the tip lies within the superior cavoatrial junction. The catheter aspirates and flushes normally and is ready for immediate use.  IMPRESSION: Successful placement of dual lumen tunneled PICC catheter via the right internal jugular vein with tip terminating at the superior caval atrial junction. The catheter is ready for immediate use. Electronically Signed   By: Jerilynn Mages.  Shick M.D.   On: 04/24/2020 10:36   IR US Guide Vasc Access Right  Result Date: 04/24/2020 INDICATION: GI BLEED, ACUTE BLOOD LOSS ANEMIA, POOR PERIPHERAL ACCESS EXAM: TUNNELED PICC LINE WITH ULTRASOUND AND FLUOROSCOPIC GUIDANCE MEDICATIONS: 1% lidocaine local. ANESTHESIA/SEDATION: Moderate Sedation Time:  None. The patient was continuously monitored during the procedure by the interventional radiology nurse under my direct supervision. FLUOROSCOPY TIME:  Fluoroscopy Time: 0 minutes 18 seconds (1 mGy). COMPLICATIONS: None immediate. PROCEDURE: Informed written consent was obtained from the patient after a discussion of the risks, benefits, and alternatives to treatment. Questions regarding the procedure were encouraged and answered. The right neck and chest were prepped with chlorhexidine in a sterile fashion, and a sterile drape was applied covering the operative field. Maximum barrier sterile technique with sterile gowns and gloves were used for the procedure. A timeout was performed prior to the initiation of the procedure. After creating a small venotomy incision, a micropuncture kit was utilized to access the right internal jugular vein under direct, real-time ultrasound guidance after the overlying soft tissues were anesthetized with 1% lidocaine with epinephrine. Ultrasound image documentation was performed. The microwire was kinked to measure appropriate catheter length. The micropuncture sheath was exchanged for a peel-away sheath over a guidewire. A 5 French dual lumen tunneled PICC measuring 27 cm was tunneled in a retrograde fashion from the anterior chest wall to the venotomy incision. The catheter was then placed through the peel-away sheath with tip ultimately  positioned at the superior caval-atrial junction. Final catheter positioning was confirmed and documented with a spot radiographic image. The catheter aspirates and flushes normally. The catheter was flushed with appropriate volume heparin dwells. The catheter exit site was secured with a 2-0 Ethilon suture. The venotomy incision was closed with Dermabond. Dressings were applied. The patient tolerated the procedure well without immediate post procedural complication. FINDINGS: After catheter placement, the tip lies within the superior cavoatrial junction. The catheter aspirates and flushes normally and is ready for immediate use. IMPRESSION: Successful placement of dual lumen tunneled PICC catheter via the right internal jugular vein with tip terminating at the superior caval atrial junction. The catheter is ready for immediate use. Electronically Signed   By: Jerilynn Mages.  Shick M.D.   On: 04/24/2020 10:36   [place] EKG  SITE RITE  Result Date: 04/23/2020 If Site Rite image not attached, placement could not be confirmed due to current cardiac rhythm.  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 History of pancreatic necrosectomy. EXAM: CT ANGIOGRAPHY ABDOMEN AND PELVIS WITH CONTRAST AND WITHOUT CONTRAST TECHNIQUE: Multidetector CT imaging of the abdomen and pelvis was performed using the standard protocol during bolus administration of intravenous contrast. Multiplanar reconstructed images and MIPs were obtained and reviewed to evaluate the vascular anatomy. CONTRAST:  125m OMNIPAQUE IOHEXOL 350 MG/ML SOLN COMPARISON:  CT abdomen and pelvis 01/11/2020 FINDINGS: VASCULAR Aorta: Small amount of atherosclerotic disease in the infrarenal abdominal aorta without dissection or aneurysm. No aortic stenosis. Celiac: Celiac trunk is widely patent. Left gastric artery and splenic artery are patent. No  evidence for aneurysm or pseudoaneurysm involving the splenic artery. Common hepatic artery is tortuous with evidence for embolization of the gastroduodenal artery. Irregular of the hepatic arteries. Multiple small collateral vessels around the GDA distribution. SMA: Patent without evidence of aneurysm, dissection, vasculitis or significant stenosis. Renals: Both renal arteries are patent without evidence of aneurysm, dissection, vasculitis, fibromuscular dysplasia or significant stenosis. IMA: Patent without evidence of aneurysm, dissection, vasculitis or significant stenosis. Inflow: Patent without evidence of aneurysm, dissection, vasculitis or significant stenosis. Proximal Outflow: Proximal femoral arteries are patent bilaterally. Veins: Marked irregularity at the portal vein confluence. The main portal vein and intrahepatic portal veins are patent but there appears to be marked narrowing and irregularity of the portal confluence near the SMV. 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 IVC and iliac veins are patent. Bilateral renal veins appear to be patent. Review of the MIP images confirms the above findings. NON-VASCULAR Lower chest: Small bilateral pleural effusions, left side greater than right. Stable 3 mm nodule in the right middle lobe on sequence 6, image 3. This nodule has been stable since 12/02/2013 and compatible with a benign nodule. Compressive atelectasis adjacent to the small effusions. Hepatobiliary: Small amount of perihepatic ascites. Cholecystectomy. No significant biliary dilatation. Pancreas: There may be a small amount of residual pancreatic parenchyma near the head and uncinate process but very little pancreatic tissue is identified and compatible with history of necrotizing pancreatitis. Since 01/11/2020, the patient has undergone pancreatic necrosectomy and there is a stent in  the stomach that extends into the pancreatic pseudocyst in the central abdomen. The large pancreatic pseudocyst collection has been decompressed. There is a small amount of residual fluid in the expected location of the pancreatic tail on sequence 16, image 30. This collection measures roughly 4.3 x 3.1 x 3.2 cm. There is a small amount of residual fluid around the stent within the central abdomen and this small collection is poorly characterized but has a maximum dimension of roughly 2.6 cm on sequence 19, image 60. Spleen: Small amount of perisplenic ascites. Adrenals/Urinary Tract: Normal appearance of the adrenal glands. Hyperdense exophytic structure in the left kidney lower pole that measures up to 2.1 cm and measured 1.7 cm on 01/11/2020. Hounsfield units do not significantly change on the post contrast images and likely represents a proteinaceous or hemorrhagic cyst with interval enlargement. Additional small left renal cysts. Tiny right renal cysts. No hydronephrosis. Urinary bladder is unremarkable. No definite renal calculi. Additional hyperdense structures or calcifications in left kidney lower pole that are not  compatible with stones. Stomach/Bowel: Interval placement of a cystogastrostomy stent extending from the stomach body into the central pancreatic pseudocyst. No clear evidence for active GI bleeding within the stomach. Patient now has a feeding jejunostomy tube. No evidence for active GI bleeding or contrast extravasation into the GI tract. Extensive mucosal enhancement involving the sigmoid colon and rectum. No evidence for bowel obstruction. Lymphatic: No significant lymph node enlargement in the abdomen or pelvis. Reproductive: Status post hysterectomy. No adnexal masses. Other: Small amount of ascites in the right lower quadrant. Small amount of perihepatic and perisplenic ascites. Ascites volume has markedly decreased since 01/11/2020. Musculoskeletal: No acute bone abnormality. IMPRESSION:  VASCULAR 1. No evidence for active GI bleeding. No evidence for aneurysm or pseudoaneurysm to explain GI bleeding. 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 Main portal veins and superior mesenteric vein are patent. 4. Varices and collaterals around the stomach are related to the splenic vein occlusion. 5.  Aortic Atherosclerosis (ICD10-I70.0). NON-VASCULAR 1. 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 Small residual collection in the expected region of the pancreatic tail. 2. Small amount of ascites. 3. Bilateral renal cysts. Evidence for a hemorrhagic or proteinaceous cyst in left kidney lower pole as described. Additional hyperdense small structures in left kidney lower pole. No suspicious renal lesions. 4. Interval placement of a jejunal feeding tube. 5. Small bilateral pleural effusions. Electronically Signed   By: Markus Daft M.D.   On: 04/24/2020 12:23        Scheduled Meds: . Chlorhexidine Gluconate Cloth  6 each Topical Daily  . diphenoxylate-atropine  2 tablet Oral QID  . insulin aspart  0-15 Units Subcutaneous Q4H  . insulin aspart  4 Units Subcutaneous TID WC  . insulin glargine  12 Units Subcutaneous Daily  . metoCLOPramide  10 mg Oral Q6H  . metoprolol tartrate  2.5 mg Intravenous Q6H  . pantoprazole (PROTONIX) IV  40 mg Intravenous Q12H  . potassium chloride  40 mEq Oral Once   Continuous Infusions: . dextrose 5 % and 0.9% NaCl 75 mL/hr at 04/25/20 0655     LOS: 3 days     Georgette Shell, MD  04/25/2020, 11:49 AM

## 2020-04-25 NOTE — Consult Note (Signed)
[place] [phone]   Fax:([phone] [place] NOTE  Patient Care Team: [name], MD as PCP - General (Family Medicine) [name], MD as Consulting Physician (Orthopedic Surgery) Ralene Bathe, MD as Consulting Physician (Dermatology) Johnnette Litter, MD as Consulting Physician (Dentistry) Thelma Comp, OD as Consulting Physician (Optometry)  Hematological/Oncological History # Normocytic Anemia   CHIEF COMPLAINTS/PURPOSE OF CONSULTATION:  "Normocytic Anemia  "  HISTORY OF PRESENTING ILLNESS:  Katherine Oliver 74 y.o. female with medical history significant for necrotizing pancreatitis, well-controlled type 2 diabetes, hepatic steatosis, GERD, hypertension, and hyperlipidemia who presents for evaluation of worsening anemia.  On review of the previous records patient has had complicated recent history with recent admission from 12/13/2019 to 02/24/2020 for acute pancreatitis and complications.  She is also had issues with atrial fibrillation and DVT requiring anticoagulation therapy.  During a portion of his hospitalization she was admitted to [facility].  She was discharged to SNF on 02/24/2020 and discharged from SNF on 04/19/2020.  Approximately 4 days ago the home health nurses noticed that the patient had become increasingly pale.  On check of the CBC the patient was noted to have a hemoglobin of 4.4.  Her glucose was 502 and bicarb was 16.  She was started on IV insulin infusion for DKA and received 2 units of packed red blood cells.  Due to concern for acute GI bleed she was transferred to [facility] for further evaluation management.  On 04/24/2020 the patient underwent an EGD which showed no clear source of her bleeding.  Due to concern for alternative sources of this patient's anemia hematology was consulted for further evaluation and management.  On exam today Katherine Oliver noted that she has  been having some issues with dark brown stools as well as dark brown vomitus.  She notes that she has not seen any bright red blood per rectum and denies having any bruising, nosebleeds, or any loss of bright red blood.  She reports that she is not currently having any new abdominal pain other than where her tubes are currently in place.  The patient notes that for the last 2 to 3 weeks she has been on normal diet and has been able to tolerate most foods including meat, vegetables, but is still not able to tolerate prepackaged red Jell-O.  The patient also reports that she had a discussion with her cardiologist regarding anticoagulation therapy and they are currently agreement in the setting of her active bleed that anticoagulation should be held.  Patient currently denies having any issues with fevers, chills, sweats, diarrhea, or other infectious symptoms.  A full 10 point ROS is listed below.  MEDICAL HISTORY:  Past Medical History:  Diagnosis Date  . Allergic rhinitis   . Arthritis   . 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 nephropathy ([place])    DSME at [facility] 01/2016   . Frequent epistaxis 05/16/2019   S/p cauterization with resolution 2020  . GERD (gastroesophageal reflux disease)   . Hepatic steatosis    by abd [place] 05/2012, mild transaminitis - normal iron sat and viral hep panel (2011), stable [place] 2017  . History of chicken pox   . History of measles   . History of recurrent UTIs    on chronic keflex  . HLD (hyperlipidemia)   .  HTN (hypertension)   . 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 Kidney cyst, acquired 01/2016   L kidney by [place]  . Kidney stone 01/2016   L kidney by [place]  . Lung nodules 11/2013   overall stable on f/u CT 01/2016  . Osteopenia 06/2013   mild, forearm T -1.1, hip and spine WNL  . Pancreatitis   . Polycythemia      mild, stable (2013)  . Primary localized osteoarthritis of right knee 01/05/2019  . Rosacea    metrogel    SURGICAL HISTORY: Past Surgical History:  Procedure Laterality Date  . APPENDECTOMY  1987  . BILIARY STENT PLACEMENT  12/14/2019   Procedure: BILIARY STENT PLACEMENT;  Surgeon: [name], MD;  Location: [place];  Service: Endoscopy;;  . BREAST BIOPSY Right 1963   benign  . BREAST BIOPSY Right 08/2014   benign- core  . cardiolite stress test  04/2004   normal  . CESAREAN SECTION  [id number];[id number]   x2  . CHOLECYSTECTOMY  2003  . COLONOSCOPY  09/26/2008   adenomatous polyp, rpt 3-5 yrs  . COLONOSCOPY  08/2012   adenomatous polyps, diverticulosis, rec rpt 5 yrs [name])  . COLONOSCOPY WITH PROPOFOL N/A 02/05/2018   4TA, SSA, diverticulosis, rpt 3 yrs [name], [name], MD)  . dexa  2003   normal  . dexa  06/2013   [facility] - Tscore -1.1 forearm, normal spine and femur  . ERCP N/A 12/14/2019   Procedure: ENDOSCOPIC RETROGRADE CHOLANGIOPANCREATOGRAPHY (ERCP);  Surgeon: [name], MD;  Location: [place];  Service: Endoscopy;  Laterality: N/A;  . ERCP  01/2020   nonbleeding gastric ulcer - [facility] hospitalization (Dr Mont Dutton)  . ESOPHAGOGASTRODUODENOSCOPY N/A 12/19/2019   Procedure: ESOPHAGOGASTRODUODENOSCOPY (EGD);  Surgeon: [name], MD;  Location: [facility] ENDOSCOPY;  Service: Endoscopy;  Laterality: N/A;  . ESOPHAGOGASTRODUODENOSCOPY  01/2020   pre existing AXIOS cystogastrostomy stent s/p necrosectomy - [facility] hospitalization (Dr Mont Dutton)  . IR FLUORO GUIDE CV LINE RIGHT  12/27/2019  . IR FLUORO GUIDE CV LINE RIGHT  04/24/2020  . IR REMOVAL TUN CV CATH W/O FL  01/03/2020  . IR REPLC DUODEN/JEJUNO TUBE PERCUT W/FLUORO  04/01/2020  . IR US GUIDE VASC ACCESS RIGHT  04/24/2020  . SPHINCTEROTOMY  12/14/2019   Procedure: SPHINCTEROTOMY;  Surgeon: [name], MD;  Location: [place];  Service: Endoscopy;;  . TOTAL KNEE ARTHROPLASTY Right 01/17/2019   Procedure: TOTAL KNEE  ARTHROPLASTY;  Surgeon: [name], MD;  Location: [facility];  Service: Orthopedics;  Laterality: Right;  . TRANSTHORACIC ECHOCARDIOGRAM  04/2019   EF 55-60%, modLVH, impaired relaxation   . TRIGGER FINGER RELEASE  2007;2010;2011   bilateral  . TRIGGER FINGER RELEASE  02/2017  . VAGINAL HYSTERECTOMY  1984   for menorrhagia, ovaries in place    SOCIAL HISTORY: Social History   Socioeconomic History  . Marital status: Married    Spouse name: Not on file  . Number of children: Not on file  . Years of education: Not on file  . Highest education level: Not on file  Occupational History  . Not on file  Tobacco Use  . Smoking status: Never Smoker  . Smokeless tobacco: Never Used  Vaping Use  . Vaping Use: Never used  Substance and Sexual Activity  . Alcohol use: Not Currently    Alcohol/week: 1.0 - 2.0 standard drink    Types: 1 - 2 Glasses of wine per week    Comment: Occasional/1  mixed drink per month  . Drug use: No  . Sexual activity: Yes  Other Topics Concern  . Not on file  Social History Narrative   Caffeine: 2 cups coffee/day   Lives with husband, no pets, grown children (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 Daily walking about 20 min.    Diet: ok water intake 4 glasses/day, daily fruits/vegetables, red meat 4x/wk, fish 3-4x/wk   Social Determinants of Health   Financial Resource Strain: Low Risk   . Difficulty of Paying Living Expenses: Not hard at all  Food Insecurity: No Food Insecurity  . Worried About Charity fundraiser in the Last Year: Never true  . Ran Out of Food in the Last Year: Never true  Transportation Needs: No Transportation Needs  . Lack of Transportation (Medical): No  . Lack of Transportation (Non-Medical): No  Physical Activity: Inactive  . Days of Exercise per Week: 0 days  . Minutes of Exercise per Session: 0 min  Stress: No Stress  Concern Present  . Feeling of Stress : Not at all  Social Connections:   . Frequency of Communication with Friends and Family:   . Frequency of Social Gatherings with Friends and Family:   . Attends Religious Services:   . Active Member of Clubs or Organizations:   . Attends Archivist Meetings:   Marland Kitchen Marital Status:   Intimate Partner Violence: Not At Risk  . Fear of Current or Ex-Partner: No  . Emotionally Abused: No  . Physically Abused: No  . Sexually Abused: No    FAMILY HISTORY: Family History  Problem Relation Age of Onset  . Stroke Mother        several  . Hyperlipidemia Mother   . Hypertension Mother   . Cancer Father        colon  . Hypertension Father   . Hyperlipidemia Father   . Coronary artery disease Father 12       MIx1, CABG  . Cancer Paternal Aunt        abdominal  . Coronary artery disease Maternal Grandmother   . Diabetes Maternal Grandfather   . Coronary artery disease Maternal Grandfather   . Breast cancer Neg Hx     ALLERGIES:  is allergic to azithromycin, nickel, sulfa antibiotics, vinegar [acetic acid], and adhesive [tape].  MEDICATIONS:  Current Facility-Administered Medications  Medication Dose Route Frequency Provider Last Rate Last Admin  . acetaminophen (TYLENOL) tablet 650 mg  650 mg Oral Q6H PRN Wonda Horner, MD   650 mg at 04/25/20 2056  . Chlorhexidine Gluconate Cloth 2 % PADS 6 each  6 each Topical Daily Wonda Horner, MD   6 each at 04/25/20 0818  . dextrose 50 % solution 0-50 mL  0-50 mL Intravenous PRN Wonda Horner, MD      . diphenoxylate-atropine (LOMOTIL) 2.5-0.025 MG per tablet 2 tablet  2 tablet Oral QID Donnamae Jude, MD   2 tablet at 04/25/20 2057  . hydrALAZINE (APRESOLINE) injection 10 mg  10 mg Intravenous Q4H PRN Anson Fret F, MD      . insulin aspart (novoLOG) injection 0-15 Units  0-15 Units Subcutaneous Q4H Wonda Horner, MD   3 Units at 04/25/20 1719  . insulin aspart (novoLOG) injection 4 Units  4  Units Subcutaneous TID WC Donnamae Jude, MD   4 Units at  04/25/20 1720  . insulin glargine (LANTUS) injection 12 Units  12 Units Subcutaneous Daily Wonda Horner, MD   12 Units at 04/25/20 0818  . loperamide (IMODIUM) capsule 2 mg  2 mg Oral QID PRN Wonda Horner, MD      . metoCLOPramide (REGLAN) tablet 10 mg  10 mg Oral Q6H Wonda Horner, MD   10 mg at 04/25/20 2056  . metoprolol tartrate (LOPRESSOR) injection 2.5 mg  2.5 mg Intravenous Q6H Anson Fret F, MD   2.5 mg at 04/25/20 1719  . pantoprazole (PROTONIX) injection 40 mg  40 mg Intravenous Q12H Wonda Horner, MD   40 mg at 04/25/20 2100    REVIEW OF SYSTEMS:   Constitutional: ( - ) fevers, ( - )  chills , ( - ) night sweats Eyes: ( - ) blurriness of vision, ( - ) double vision, ( - ) watery eyes Ears, nose, mouth, throat, and face: ( - ) mucositis, ( - ) sore throat Respiratory: ( - ) cough, ( - ) dyspnea, ( - ) wheezes Cardiovascular: ( - ) palpitation, ( - ) chest discomfort, ( - ) lower extremity swelling Gastrointestinal:  ( - ) nausea, ( - ) heartburn, ( - ) change in bowel habits Skin: ( - ) abnormal skin rashes Lymphatics: ( - ) new lymphadenopathy, ( - ) easy bruising Neurological: ( - ) numbness, ( - ) tingling, ( - ) new weaknesses Behavioral/Psych: ( - ) mood change, ( - ) new changes  All other systems were reviewed with the patient and are negative.  PHYSICAL EXAMINATION:  Vitals:   04/25/20 1711 04/25/20 2025  BP:  126/62  Pulse:  78  Resp: 16 16  Temp: 98.2 F (36.8 C) 98.5 F (36.9 C)  SpO2:  95%   Filed Weights   04/23/20 2311  Weight: 139 lb 12.4 oz (63.4 kg)    GENERAL: chronically ill appearing elderly Caucasian female in NAD  SKIN: skin color, texture, turgor are normal, no rashes or significant lesions EYES: conjunctiva are pink and non-injected, sclera clear LUNGS: clear to auscultation and percussion with normal breathing effort HEART: regular rate & rhythm and no murmurs and no lower  extremity edema Musculoskeletal: no cyanosis of digits and no clubbing  PSYCH: alert & oriented x 3, fluent speech NEURO: no focal motor/sensory deficits  LABORATORY DATA:  I have reviewed the data as listed CBC Latest Ref Rng & Units 04/25/2020 04/25/2020 04/24/2020  WBC 4.0 - 10.5 K/uL - 5.6 7.4  Hemoglobin 12.0 - 15.0 g/dL - 7.4(L) 8.1(L)  Hematocrit 36 - 46 % - 23.2(L) 25.0(L)  Platelets 150 - 400 K/uL 154 146(L) 139(L)    CMP Latest Ref Rng & Units 04/25/2020 04/24/2020 04/22/2020  Glucose 70 - 99 mg/dL 132(H) 203(H) 140(H)  BUN 8 - 23 mg/dL _0 Creatinine 0.44 - 1.00 mg/dL 0.73 0.76 0.79  Sodium 135 - 145 mmol/L 140 139 139  Potassium 3.5 - 5.1 mmol/L 3.2(L) 3.6 4.0  Chloride 98 - 111 mmol/L 113(H) 114(H) 108  CO2 22 - 32 mmol/L 20(L) 18(L) 21(L)  Calcium 8.9 - 10.3 mg/dL 8.3(L) 8.6(L) 9.4  Total Protein 6.5 - 8.1 g/dL - - -  Total Bilirubin 0.3 - 1.2 mg/dL - - -  Alkaline Phos 38 - 126 U/L - - -  AST 15 - 41 U/L - - -  ALT 0 - 44 U/L - - -    RADIOGRAPHIC STUDIES: IR  Fluoro Guide CV Line Right  Result Date: 04/24/2020 INDICATION: GI BLEED, ACUTE BLOOD LOSS ANEMIA, POOR PERIPHERAL ACCESS EXAM: TUNNELED PICC LINE WITH ULTRASOUND AND FLUOROSCOPIC GUIDANCE MEDICATIONS: 1% lidocaine local. ANESTHESIA/SEDATION: Moderate Sedation Time:  None. The patient was continuously monitored during the procedure by the interventional radiology nurse under my direct supervision. FLUOROSCOPY TIME:  Fluoroscopy Time: 0 minutes 18 seconds (1 mGy). COMPLICATIONS: None immediate. PROCEDURE: Informed written consent was obtained from the patient after a discussion of the risks, benefits, and alternatives to treatment. Questions regarding the procedure were encouraged and answered. The right neck and chest were prepped with chlorhexidine in a sterile fashion, and a sterile drape was applied covering the operative field. Maximum barrier sterile technique with sterile gowns and gloves were used for the  procedure. A timeout was performed prior to the initiation of the procedure. After creating a small venotomy incision, a micropuncture kit was utilized to access the right internal jugular vein under direct, real-time ultrasound guidance after the overlying soft tissues were anesthetized with 1% lidocaine with epinephrine. Ultrasound image documentation was performed. The microwire was kinked to measure appropriate catheter length. The micropuncture sheath was exchanged for a peel-away sheath over a guidewire. A 5 French dual lumen tunneled PICC measuring 27 cm was tunneled in a retrograde fashion from the anterior chest wall to the venotomy incision. The catheter was then placed through the peel-away sheath with tip ultimately positioned at the superior caval-atrial junction. Final catheter positioning was confirmed and documented with a spot radiographic image. The catheter aspirates and flushes normally. The catheter was flushed with appropriate volume heparin dwells. The catheter exit site was secured with a 2-0 Ethilon suture. The venotomy incision was closed with Dermabond. Dressings were applied. The patient tolerated the procedure well without immediate post procedural complication. FINDINGS: After catheter placement, the tip lies within the superior cavoatrial junction. The catheter aspirates and flushes normally and is ready for immediate use. IMPRESSION: Successful placement of dual lumen tunneled PICC catheter via the right internal jugular vein with tip terminating at the superior caval atrial junction. The catheter is ready for immediate use. Electronically Signed   By: Jerilynn Mages.  Shick M.D.   On: 04/24/2020 10:36   IR Replc Duoden/Jejuno Tube Percut W/Fluoro  Result Date: 04/02/2020 INDICATION: Indwelling jejunostomy tube has become completely dislodged and requires replacement. EXAM: JEJUNOSTOMY TUBE REPLACEMENT UNDER FLUOROSCOPY MEDICATIONS: None ANESTHESIA/SEDATION: None CONTRAST:  20 mL Omnipaque  300-administered into the gastric lumen. FLUOROSCOPY TIME:  Fluoroscopy Time: 2 minutes and 42 seconds. 7.0 mGy. COMPLICATIONS: None immediate. PROCEDURE: Informed written consent was obtained from the patient after a thorough discussion of the procedural risks, benefits and alternatives. All questions were addressed. Maximal Sterile Barrier Technique was utilized including caps, mask, sterile gowns, sterile gloves, sterile drape, hand hygiene and skin antiseptic. A timeout was performed prior to the initiation of the procedure. A 5 French catheter was advanced through the pre-existing jejunostomy tract. Contrast injection was performed under fluoroscopy. A hydrophilic guidewire was advanced into the jejunum. Catheter and guidewire access were further advanced into the jejunum. A 14 French balloon retention jejunostomy catheter was then cut to appropriate length. The tube was advanced over the hydrophilic guidewire. Catheter position was confirmed by fluoroscopy. The retention balloon was inflated with approximately 3-5 mL of saline. The tube was injected with contrast material and a fluoroscopic image obtained. FINDINGS: Successful access of the jejunum was accomplished via the pre-existing percutaneous tract and abdominal wall opening. The 14 French jejunostomy catheter was  advanced well into the jejunum. IMPRESSION: Successful replacement of dislodged jejunostomy tube with placement of a new 35 French balloon retention jejunostomy catheter via the pre-existing abdominal wall opening and percutaneous tract. The tube extends well into the jejunum and is ready for use. Electronically Signed   By: Aletta Edouard M.D.   On: 04/02/2020 09:06   IR US Guide Vasc Access Right  Result Date: 04/24/2020 INDICATION: GI BLEED, ACUTE BLOOD LOSS ANEMIA, POOR PERIPHERAL ACCESS EXAM: TUNNELED PICC LINE WITH ULTRASOUND AND FLUOROSCOPIC GUIDANCE MEDICATIONS: 1% lidocaine local. ANESTHESIA/SEDATION: Moderate Sedation Time:   None. The patient was continuously monitored during the procedure by the interventional radiology nurse under my direct supervision. FLUOROSCOPY TIME:  Fluoroscopy Time: 0 minutes 18 seconds (1 mGy). COMPLICATIONS: None immediate. PROCEDURE: Informed written consent was obtained from the patient after a discussion of the risks, benefits, and alternatives to treatment. Questions regarding the procedure were encouraged and answered. The right neck and chest were prepped with chlorhexidine in a sterile fashion, and a sterile drape was applied covering the operative field. Maximum barrier sterile technique with sterile gowns and gloves were used for the procedure. A timeout was performed prior to the initiation of the procedure. After creating a small venotomy incision, a micropuncture kit was utilized to access the right internal jugular vein under direct, real-time ultrasound guidance after the overlying soft tissues were anesthetized with 1% lidocaine with epinephrine. Ultrasound image documentation was performed. The microwire was kinked to measure appropriate catheter length. The micropuncture sheath was exchanged for a peel-away sheath over a guidewire. A 5 French dual lumen tunneled PICC measuring 27 cm was tunneled in a retrograde fashion from the anterior chest wall to the venotomy incision. The catheter was then placed through the peel-away sheath with tip ultimately positioned at the superior caval-atrial junction. Final catheter positioning was confirmed and documented with a spot radiographic image. The catheter aspirates and flushes normally. The catheter was flushed with appropriate volume heparin dwells. The catheter exit site was secured with a 2-0 Ethilon suture. The venotomy incision was closed with Dermabond. Dressings were applied. The patient tolerated the procedure well without immediate post procedural complication. FINDINGS: After catheter placement, the tip lies within the superior cavoatrial  junction. The catheter aspirates and flushes normally and is ready for immediate use. IMPRESSION: Successful placement of dual lumen tunneled PICC catheter via the right internal jugular vein with tip terminating at the superior caval atrial junction. The catheter is ready for immediate use. Electronically Signed   By: Jerilynn Mages.  Shick M.D.   On: 04/24/2020 10:36   [place] EKG SITE RITE  Result Date: 04/23/2020 If Site Rite image not attached, placement could not be confirmed due to current cardiac rhythm.  [place] EKG SITE RITE  Result Date: 04/22/2020 If Site Rite image not attached, placement could not be confirmed due to current cardiac rhythm.  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 History of pancreatic necrosectomy. EXAM: CT ANGIOGRAPHY ABDOMEN AND PELVIS WITH CONTRAST AND WITHOUT CONTRAST TECHNIQUE: Multidetector CT imaging of the abdomen and pelvis was performed using the standard protocol during bolus administration of intravenous contrast. Multiplanar reconstructed images and MIPs were obtained and reviewed to evaluate the vascular anatomy. CONTRAST:  166m OMNIPAQUE IOHEXOL 350 MG/ML SOLN COMPARISON:  CT abdomen and pelvis 01/11/2020 FINDINGS: VASCULAR Aorta: Small amount of atherosclerotic disease in the infrarenal abdominal aorta without dissection or aneurysm.  No aortic stenosis. Celiac: Celiac trunk is widely patent. Left gastric artery and splenic artery are patent. No evidence for aneurysm or pseudoaneurysm involving the splenic artery. Common hepatic artery is tortuous with evidence for embolization of the gastroduodenal artery. Irregular of the hepatic arteries. Multiple small collateral vessels around the GDA distribution. SMA: Patent without evidence of aneurysm, dissection, vasculitis or significant stenosis. Renals: Both renal arteries are patent without evidence  of aneurysm, dissection, vasculitis, fibromuscular dysplasia or significant stenosis. IMA: Patent without evidence of aneurysm, dissection, vasculitis or significant stenosis. Inflow: Patent without evidence of aneurysm, dissection, vasculitis or significant stenosis. Proximal Outflow: Proximal femoral arteries are patent bilaterally. Veins: Marked irregularity at the portal vein confluence. The main portal vein and intrahepatic portal veins are patent but there appears to be marked narrowing and irregularity of the portal confluence near the SMV. 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 IVC and iliac veins are patent. Bilateral renal veins appear to be patent. Review of the MIP images confirms the above findings. NON-VASCULAR Lower chest: Small bilateral pleural effusions, left side greater than right. Stable 3 mm nodule in the right middle lobe on sequence 6, image 3. This nodule has been stable since 12/02/2013 and compatible with a benign nodule. Compressive atelectasis adjacent to the small effusions. Hepatobiliary: Small amount of perihepatic ascites. Cholecystectomy. No significant biliary dilatation. Pancreas: There may be a small amount of residual pancreatic parenchyma near the head and uncinate process but very little pancreatic tissue is identified and compatible with history of necrotizing pancreatitis. 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 The large pancreatic pseudocyst collection has been decompressed. There is a small amount of residual fluid in the expected location of the pancreatic tail on sequence 16, image 30. This collection measures roughly 4.3 x 3.1 x 3.2 cm. There is a small amount of residual fluid around the stent within the central abdomen and this  small collection is poorly characterized but has a maximum dimension of roughly 2.6 cm on sequence 19, image 60. Spleen: Small amount of perisplenic ascites. Adrenals/Urinary Tract: Normal appearance of the adrenal glands. Hyperdense exophytic structure in the left kidney lower pole that measures up to 2.1 cm and measured 1.7 cm on 01/11/2020. Hounsfield units do not significantly change on the post contrast images and likely represents a proteinaceous or hemorrhagic cyst with interval enlargement. Additional small left renal cysts. Tiny right renal cysts. No hydronephrosis. Urinary bladder is unremarkable. No definite renal calculi. Additional hyperdense structures or calcifications in left kidney lower pole that are not compatible with stones. Stomach/Bowel: Interval placement of a cystogastrostomy stent extending from the stomach body into the central pancreatic pseudocyst. No clear evidence for active GI bleeding within the stomach. Patient now has a feeding jejunostomy tube. No evidence for active GI bleeding or contrast extravasation into the GI tract. Extensive mucosal enhancement involving the sigmoid colon and rectum. No evidence for bowel obstruction. Lymphatic: No significant lymph node enlargement in the abdomen or pelvis. Reproductive: Status post hysterectomy. No adnexal masses. Other: Small amount of ascites in the right lower quadrant. Small amount of perihepatic and perisplenic ascites. Ascites volume has markedly decreased since 01/11/2020. Musculoskeletal: No acute bone abnormality. IMPRESSION: VASCULAR 1. No evidence for active GI bleeding. No evidence for aneurysm or pseudoaneurysm to explain GI bleeding. 2.  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 Main portal veins and superior mesenteric vein are patent. 4. Varices and collaterals around the stomach are related to the splenic vein occlusion. 5.  Aortic Atherosclerosis  (ICD10-I70.0). NON-VASCULAR 1. 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 Small residual collection in the expected region of the pancreatic tail. 2. Small amount of ascites. 3. Bilateral renal cysts. Evidence for a hemorrhagic or proteinaceous cyst in left kidney lower pole as described. Additional hyperdense small structures in left kidney lower pole. No suspicious renal lesions. 4. Interval placement of a jejunal feeding tube. 5. Small bilateral pleural effusions. Electronically Signed   By: Markus Daft M.D.   On: 04/24/2020 12:23    ASSESSMENT & PLAN Katherine Oliver 74 y.o. female with medical history significant for necrotizing pancreatitis, well-controlled type 2 diabetes, hepatic steatosis, GERD, hypertension, and hyperlipidemia who presents for evaluation of worsening anemia.  After review the labs, discussion with the patient, and reviewed the prior records the findings are most consistent with normocytic anemia secondary to bleeding.  The most likely etiology is a GI bleed.  Up to the point the labs are reassuring that there is no hemolysis or bone marrow dysfunction.  The patient has quite a robust reticulocytosis with 9% reticulocytes and a retake index of 2.4%, implying an adequate response of the degree of anemia.  This effectively rules out bone marrow dysfunction.  In terms of hemolysis the patient has a normal LDH and the DAT is negative.  Haptoglobin is currently pending but this pattern is most consistent with blood loss rather than anemia of an alternative source.  Patient does have an iron sat of 10% with a falsely elevated ferritin and may benefit from iron therapy in the long-term to replete her iron stores.  Additionally folic acid can be given help the patient replete her blood stores following control of this acute episode.  At this time I would consider further evaluation with GI to find alternative sources of bleeding such as small  bowel bleed or colonic bleed.  # Normocytic Anemia  --findings are most consistent with anemia from a GI bleed. I recommend continued evaluation with GI to identify a possible source --agree with holding anticoagulation in the setting of concern for GI bleed --LDH is normal, DAT is negative, and reticulocytes are robust. Haptoglobin is currently pending. These findings make hemolysis or bone marrow dysfunction far less likely. This pattern is typically seen with acute blood loss --ferritin is likely falsely elevated in the setting of inflammation from the pancreas. The patient's Iron Sat is 10%, implying some degree of iron deficiency (though this does not appear to be limit the patient's robust reticulocyte response, Retic Index 2.54)  --patient may benefit from PO iron 106YI daily with Folic acid 1 mg PO daily once the source of the blood loss is identified and treated.  --continue transfusion for hemoglobin <7.0 or <8.0 if patient is symptomatic --hematology will continue to follow peripherally. Please do not hesitate to call or page with any questions or concerns regarding the care of this patient.   A total of more than 55 minutes were spent on this encounter and over half of that time was spent on counseling and coordination of care as outlined above.   Ledell Peoples, MD Department of Hematology/Oncology [place] [facility] Phone: [phone] Pager: [phone] Email: [email or web address]  04/25/2020 11:17 PM

## 2020-04-25 NOTE — Evaluation (Signed)
Physical Therapy Evaluation Patient Details Name: Katherine Oliver MRN: [id number] DOB: 1946-06-09 Today's Date: 04/25/2020   History of Present Illness  Pt is a 74 y/o female admitted secondary to being pale and found to be anemic. GI was consulted and found no active source of bleeding. Of note, pt with recent complex medical hx including admitted on December 13, 2019 for presumed gallstone pancreatitis patient had undergone ERCP which showed biliary sludge with successful placement of biliary stent.  However patient developed acute renal failure requiring CRRT.  Patient also developed A. fib with RVR.  Since patient started having worsening abdominal pain nausea and distention with evidence of new peripancreatic fluid collection despite CBD stent patient was transferred to [facility] for further care where patient underwent cystogastrostomy placement and double pigtail stents endoscopic necrosectomy and percutaneous drainage of the cyst.  During the hospitalization patient also developed gastroduodenal artery bleeding and required embolization.  Patient also was started on Lovenox for DVT.  Patient was discharged to [facility] on February 24, 2020.  With drains in place.  Patient was discharged from the SNF on April 19, 2020 about 4 days ago and the home health nurse noticed that patient has been pale looking. Additional PMH including but not limited to DM and HTN.    Clinical Impression  Pt presented supine in bed with HOB elevated, awake and willing to participate in therapy session. Pt's spouse present throughout session as well. Prior to admission, pt reported that she has been ambulating with use of a RW and required some assistance with ADLs. Pt lives with her husband in a two level home Chiropractor bed/bathroom on main level) with one small step to enter. At the time of evaluation, pt overall limited secondary to fatigue. She was able to perform bed mobility with supervision,  transfers with min guard and ambulated within her room with use of RW and min guard for safety. VSS throughout. Pt would continue to benefit from skilled physical therapy services at this time while admitted and after d/c to address the below listed limitations in order to improve overall safety and independence with functional mobility.     Follow Up Recommendations Home health PT    Equipment Recommendations  None recommended by PT    Recommendations for Other Services       Precautions / Restrictions Precautions Precautions: Fall Restrictions Weight Bearing Restrictions: No      Mobility  Bed Mobility Overal bed mobility: Needs Assistance Bed Mobility: Supine to Sit;Sit to Supine     Supine to sit: Supervision Sit to supine: Supervision   General bed mobility comments: increased time and effort needed  Transfers Overall transfer level: Needs assistance Equipment used: Rolling walker (2 wheeled) Transfers: Sit to/from Stand Sit to Stand: Min guard         General transfer comment: good technique utilized, min guard for safety   Ambulation/Gait Ambulation/Gait assistance: Min guard Gait Distance (Feet): 30 Feet Assistive device: Rolling walker (2 wheeled) Gait Pattern/deviations: Step-through pattern;Decreased stride length Gait velocity: decreased   General Gait Details: pt with slow, steady gait; limited secondary to fatigue; no overt LOB or need for physical assistance, min guard for safety. VSS throughout  Stairs            Wheelchair Mobility    Modified Rankin (Stroke Patients Only)       Balance Overall balance assessment: Needs assistance Sitting-balance support: Feet supported Sitting balance-Leahy Scale: Good     Standing balance  support: Bilateral upper extremity supported;Single extremity supported Standing balance-Leahy Scale: Poor                               Pertinent Vitals/Pain Pain Assessment: Faces Faces  Pain Scale: Hurts little more Pain Location: bilateral gastrocs Pain Descriptors / Indicators: Cramping Pain Intervention(s): Monitored during session;Repositioned    Home Living Family/patient expects to be discharged to:: Private residence Living Arrangements: Spouse/significant other Available Help at Discharge: Family;Available 24 hours/day Type of Home: House Home Access: Stairs to enter Entrance Stairs-Rails: None Entrance Stairs-Number of Steps: 1 Home Layout: Two level;Able to live on main level with bedroom/bathroom Home Equipment: Walker - 2 wheels      Prior Function Level of Independence: Needs assistance   Gait / Transfers Assistance Needed: has been ambulating with RW  ADL's / Homemaking Assistance Needed: was recently d/c'd home from SNF where she required assistance for set-up for a sponge bath. Had not attempted bathing yet since she's been back home        Hand Dominance        Extremity/Trunk Assessment   Upper Extremity Assessment Upper Extremity Assessment: Overall WFL for tasks assessed    Lower Extremity Assessment Lower Extremity Assessment: Generalized weakness    Cervical / Trunk Assessment Cervical / Trunk Assessment: Kyphotic  Communication   Communication: No difficulties  Cognition Arousal/Alertness: Awake/alert Behavior During Therapy: Flat affect Overall Cognitive Status: Within Functional Limits for tasks assessed                                        General Comments      Exercises     Assessment/Plan    PT Assessment Patient needs continued PT services  PT Problem List Decreased strength;Decreased range of motion;Decreased activity tolerance;Decreased balance;Decreased mobility;Decreased coordination;Decreased knowledge of use of DME;Decreased safety awareness       PT Treatment Interventions DME instruction;Gait training;Stair training;Functional mobility training;Balance training;Therapeutic  activities;Therapeutic exercise;Neuromuscular re-education;Patient/family education    PT Goals (Current goals can be found in the Care Plan section)  Acute Rehab PT Goals Patient Stated Goal: to return home and get better PT Goal Formulation: With patient/family Time For Goal Achievement: 05/09/20 Potential to Achieve Goals: Good    Frequency Min 3X/week   Barriers to discharge        Co-evaluation               AM-PAC PT "6 Clicks" Mobility  Outcome Measure Help needed turning from your back to your side while in a flat bed without using bedrails?: None Help needed moving from lying on your back to sitting on the side of a flat bed without using bedrails?: None Help needed moving to and from a bed to a chair (including a wheelchair)?: None Help needed standing up from a chair using your arms (e.g., wheelchair or bedside chair)?: None Help needed to walk in hospital room?: A Little Help needed climbing 3-5 steps with a railing? : A Lot 6 Click Score: 21    End of Session Equipment Utilized During Treatment: Gait belt Activity Tolerance: Patient limited by fatigue Patient left: in bed;with call bell/phone within reach;with bed alarm set;with family/visitor present Nurse Communication: Mobility status PT Visit Diagnosis: Other abnormalities of gait and mobility (R26.89)    Time: 6734-1937 PT Time Calculation (min) (ACUTE ONLY): 18  min   Charges:   PT Evaluation $PT Eval Moderate Complexity: 1 Mod          Eduard Clos, PT, DPT  Acute Rehabilitation Services Pager [phone] Office [place] 04/25/2020, 2:47 PM

## 2020-04-25 NOTE — TOC Initial Note (Signed)
Transition of Care [facility]) - Initial/Assessment Note    Patient Details  Name: Katherine Oliver MRN: [id number] Date of Birth: 01/27/1946  Transition of Care [facility]) CM/SW Contact:    Ninfa Meeker, RN Phone Number: 04/25/2020, 3:08 PM  Clinical Narrative: Patient is 74 yr old female admitted with GI bleed. Was hospitalized 12/13/19-02/22/20 acute pancreatitis with complications. Patient is being transfused today for Hgb of 7.4.  Case manager spoke with patient via telephone to discuss home needs when medically ready. She states she is active with [name],had only seen RN and had to return to hospital .  Patient has RW, walk in shower and support of her husband. Per MD patient is not ready for discharge, continued medical workup is needed. TOC team will continue to follow.              Expected Discharge Plan: [place] Barriers to Discharge: Continued Medical Work up   Patient Goals and CMS Choice Patient states their goals for this hospitalization and ongoing recovery are:: to get better and go home CMS Medicare.gov Compare Post Acute Care list provided to:: Patient Choice offered to / list presented to : Patient  Expected Discharge Plan and Services Expected Discharge Plan: [place] In-house Referral: NA Discharge Planning Services: CM Consult Post Acute Care Choice: Home Health, Resumption of Svcs/PTA Provider Living arrangements for the past 2 months: Single Family Home                 DME Arranged: N/A DME Agency: NA       HH Arranged: RN, PT, OT [place] Agency: [name]        Prior Living Arrangements/Services Living arrangements for the past 2 months: Single Family Home Lives with:: Spouse   Do you feel safe going back to the place where you live?: Yes      Need for Family Participation in Patient Care: Yes (Comment) Care giver support system in place?: Yes (comment) Current home services: Home  OT, Home PT, Home RN Criminal Activity/Legal Involvement Pertinent to Current Situation/Hospitalization: No - Comment as needed  Activities of Daily Living Home Assistive Devices/Equipment: None ADL Screening (condition at time of admission) Patient's cognitive ability adequate to safely complete daily activities?: Yes Is the patient deaf or have difficulty hearing?: No Does the patient have difficulty seeing, even when wearing glasses/contacts?: No Does the patient have difficulty concentrating, remembering, or making decisions?: No Patient able to express need for assistance with ADLs?: Yes Does the patient have difficulty dressing or bathing?: No Independently performs ADLs?: Yes (appropriate for developmental age) Does the patient have difficulty walking or climbing stairs?: No Weakness of Legs: None Weakness of Arms/Hands: None  Permission Sought/Granted Permission sought to share information with : Case Manager                Emotional Assessment   Attitude/Demeanor/Rapport: Gracious     Alcohol / Substance Use: Not Applicable Psych Involvement: No (comment)  Admission diagnosis:  Acute GI bleeding [K92.2] Patient Active Problem List   Diagnosis Date Noted  . Acute upper GI bleed 04/22/2020  . GI bleed 04/22/2020  . Acute GI bleeding 04/22/2020  . Acute blood loss anemia 04/22/2020  . DKA (diabetic ketoacidoses) ([place]) 04/22/2020  . UTI (urinary tract infection) 03/07/2020  . Biliary drain displacement 03/07/2020  . Gastrostomy in place [facility]) 03/07/2020  . Chronic pancreatitis ([place]) 01/12/2020  . Acute pancreatitis 01/07/2020  .  Ovarian mass, right 01/06/2020  . Acquired renal cyst of left kidney 01/06/2020  . Gastric stress ulcer 01/06/2020  . Acute necrotizing pancreatitis   . AF (paroxysmal atrial fibrillation) ([place])   . Anemia   . AKI (acute kidney injury) ([place]) 12/15/2019  . Choledocholithiasis   . Acute gallstone pancreatitis 12/13/2019  . Primary  localized osteoarthritis of right knee 01/05/2019  . 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 Hx of adenomatous polyp of colon 12/15/2017  . Vulvar dermatitis 12/07/2017  . Stress due to illness of family member 08/18/2017  . Left sided abdominal pain 02/05/2017  . Right hip pain 08/07/2016  . CKD stage 3 due to type 2 diabetes mellitus ([place]) 04/04/2016  . Trigger ring finger of right hand 04/04/2016  . Pulmonary nodules 01/03/2016  . Advanced care planning/counseling discussion 07/05/2015  . Obesity, Class I, BMI 30-34.9 07/05/2015  . 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 Osteopenia 06/03/2013  . Medicare annual wellness visit, subsequent 05/31/2012  . Rotator cuff tendonitis, right 03/01/2012  . Polycythemia 12/02/2011  . Diabetes mellitus type 2, uncontrolled, with complications ([place])   . HTN (hypertension)   . Dyslipidemia associated with type 2 diabetes mellitus ([place])   . History of recurrent UTIs   . Allergic rhinitis   . GERD (gastroesophageal reflux disease)   . Rosacea   . NAFLD (nonalcoholic fatty liver disease) 06/03/2010   PCP:  [name], MD Pharmacy:   [facility], [address], [address] [place], [place] [address] Phone: [phone] Fax: [phone]  [id number] [place], [address] [place] [address] Phone: [phone] Fax: [phone]     Social Determinants of Health (SDOH) Interventions    Readmission Risk Interventions No flowsheet data found.

## 2020-04-26 ENCOUNTER — Inpatient Hospital Stay (HOSPITAL_COMMUNITY): Payer: Medicare Other

## 2020-04-26 ENCOUNTER — Encounter (HOSPITAL_COMMUNITY): Payer: Self-pay | Admitting: Gastroenterology

## 2020-04-26 ENCOUNTER — Ambulatory Visit: Payer: Medicare Other | Admitting: Family Medicine

## 2020-04-26 DIAGNOSIS — R609 Edema, unspecified: Secondary | ICD-10-CM

## 2020-04-26 HISTORY — PX: IR IVC FILTER PLMT / S&I /IMG GUID/MOD SED: IMG701

## 2020-04-26 LAB — COMPREHENSIVE METABOLIC PANEL
ALT: 11 U/L (ref 0–44)
AST: 11 U/L — ABNORMAL LOW (ref 15–41)
Albumin: 2.2 g/dL — ABNORMAL LOW (ref 3.5–5.0)
Alkaline Phosphatase: 55 U/L (ref 38–126)
Anion gap: 6 (ref 5–15)
BUN: 12 mg/dL (ref 8–23)
CO2: 20 mmol/L — ABNORMAL LOW (ref 22–32)
Calcium: 8.3 mg/dL — ABNORMAL LOW (ref 8.9–10.3)
Chloride: 111 mmol/L (ref 98–111)
Creatinine, Ser: 0.85 mg/dL (ref 0.44–1.00)
GFR calc Af Amer: >60 mL/min (ref >60)
GFR calc non Af Amer: >60 mL/min (ref >60)
Glucose, Bld: 138 mg/dL — ABNORMAL HIGH (ref 70–99)
Potassium: 4.1 mmol/L (ref 3.5–5.1)
Sodium: 137 mmol/L (ref 135–145)
Total Bilirubin: 0.6 mg/dL (ref 0.3–1.2)
Total Protein: 4.4 g/dL — ABNORMAL LOW (ref 6.5–8.1)

## 2020-04-26 LAB — MAGNESIUM: Magnesium: 1.8 mg/dL (ref 1.7–2.4)

## 2020-04-26 LAB — HEMOGLOBIN AND HEMATOCRIT, BLOOD
HCT: 28 % — ABNORMAL LOW (ref 36.0–46.0)
Hemoglobin: 9 g/dL — ABNORMAL LOW (ref 12.0–15.0)

## 2020-04-26 LAB — TYPE AND SCREEN
ABO/RH(D): B POS
Antibody Screen: POSITIVE
Unit division: 0

## 2020-04-26 LAB — CBC
HCT: 28.3 % — ABNORMAL LOW (ref 36.0–46.0)
Hemoglobin: 9.3 g/dL — ABNORMAL LOW (ref 12.0–15.0)
MCH: 30.2 pg (ref 26.0–34.0)
MCHC: 32.9 g/dL (ref 30.0–36.0)
MCV: 91.9 fL (ref 80.0–100.0)
Platelets: 131 10*3/uL — ABNORMAL LOW (ref 150–400)
RBC: 3.08 MIL/uL — ABNORMAL LOW (ref 3.87–5.11)
RDW: 17 % — ABNORMAL HIGH (ref 11.5–15.5)
WBC: 6.1 10*3/uL (ref 4.0–10.5)
nRBC: 0 % (ref 0.0–0.2)

## 2020-04-26 LAB — GLUCOSE, CAPILLARY
Glucose-Capillary: 113 mg/dL — ABNORMAL HIGH (ref 70–99)
Glucose-Capillary: 128 mg/dL — ABNORMAL HIGH (ref 70–99)
Glucose-Capillary: 136 mg/dL — ABNORMAL HIGH (ref 70–99)
Glucose-Capillary: 137 mg/dL — ABNORMAL HIGH (ref 70–99)
Glucose-Capillary: 139 mg/dL — ABNORMAL HIGH (ref 70–99)
Glucose-Capillary: 191 mg/dL — ABNORMAL HIGH (ref 70–99)

## 2020-04-26 LAB — BPAM RBC
Blood Product Expiration Date: 202107082359
ISSUE DATE / TIME: 202106240916
Unit Type and Rh: 7300

## 2020-04-26 LAB — OCCULT BLOOD X 1 CARD TO LAB, STOOL: Fecal Occult Bld: NEGATIVE

## 2020-04-26 MED ORDER — FENTANYL CITRATE (PF) 100 MCG/2ML IJ SOLN
INTRAMUSCULAR | Status: AC | PRN
  Administered 2020-04-26: 25 ug via INTRAVENOUS

## 2020-04-26 MED ORDER — LIDOCAINE HCL 1 % IJ SOLN
INTRAMUSCULAR | Status: AC
  Filled 2020-04-26: qty 20

## 2020-04-26 MED ORDER — LIDOCAINE HCL (PF) 1 % IJ SOLN
INTRAMUSCULAR | Status: AC | PRN
  Administered 2020-04-26: 5 mL

## 2020-04-26 MED ORDER — FENTANYL CITRATE (PF) 100 MCG/2ML IJ SOLN
INTRAMUSCULAR | Status: AC
  Filled 2020-04-26: qty 2

## 2020-04-26 MED ORDER — CEFAZOLIN SODIUM-DEXTROSE 2-4 GM/100ML-% IV SOLN
2.0000 g | Freq: Once | INTRAVENOUS | Status: AC
  Filled 2020-04-26: qty 100

## 2020-04-26 MED ORDER — CEFAZOLIN SODIUM-DEXTROSE 2-4 GM/100ML-% IV SOLN
INTRAVENOUS | Status: AC
  Administered 2020-04-26: 2 g via INTRAVENOUS
  Filled 2020-04-26: qty 100

## 2020-04-26 NOTE — Progress Notes (Addendum)
[facility] Gastroenterology Progress Note  Katherine Oliver 74 y.o. [birth year]  CC:  Melena, resolved; anemia, improving  Subjective: We were asked to follow up with the patient due to continued melena.  However, patient states she has not had any melenic stools for two to three days. Denies any abdominal pain, nausea, vomiting.  Per review of flow sheet, no melenic stools documented.  ROS : Review of Systems  Cardiovascular: Negative for chest pain and palpitations.  Gastrointestinal: Negative for abdominal pain, blood in stool, constipation, diarrhea, heartburn, melena, nausea and vomiting.   Objective: Vital signs in last 24 hours: Vitals:   04/26/20 0018 04/26/20 0330  BP: 130/65 133/76  Pulse: 77 79  Resp: 16 16  Temp: 97.9 F (36.6 C) 97.9 F (36.6 C)  SpO2: 98% 99%    Physical Exam:  General:  Alert, oriented, cooperative, no acute distress, elderly  Head:  Normocephalic, without obvious abnormality, atraumatic  Eyes:  Anicteric sclera, EOMs intact  Lungs:   Clear to auscultation bilaterally, respirations unlabored  Heart:  Regular rate and rhythm, S1, S2 normal  Abdomen:   Soft, non-tender, nondistended, bowel sounds active all four quadrants,  no guarding or peritoneal signs  Extremities: Extremities normal, atraumatic, no  edema  Pulses: 2+ and symmetric    Lab Results: Recent Labs    04/25/20 0502 04/25/20 1149 04/26/20 0446  NA 140  --  137  K 3.2*  --  4.1  CL 113*  --  111  CO2 20*  --  20*  GLUCOSE 132*  --  138*  BUN 10  --  12  CREATININE 0.73  --  0.85  CALCIUM 8.3*  --  8.3*  MG  --  1.1* 1.8   Recent Labs    04/26/20 0446  AST 11*  ALT 11  ALKPHOS 55  BILITOT 0.6  PROT 4.4*  ALBUMIN 2.2*   Recent Labs    04/25/20 0502 04/25/20 0502 04/25/20 1530 04/25/20 2300 04/26/20 0446  WBC 5.6  --   --   --  6.1  HGB 7.4*   < >  --  9.0* 9.3*  HCT 23.2*   < >  --  28.0* 28.3*  MCV 92.8  --   --   --  91.9  PLT 146*   < > 154  --   131*   < > = values in this interval not displayed.   Recent Labs    04/25/20 1530  LABPROT 14.6  INR 1.2   Impression: Recent melena/hematemesis in the setting of Lovenox use, now resolved -Normal EGD on 6/22 with no evidence of active or recent bleeding -FOBT negative today -Hgb 9.3 today, stable -BUN 12/Cr 0.85 -No current signs of GI bleeding  Patient is having IVC filter placed today so that anticoagulation can be discontinued.  History of necrotizing pancreatitis s/p necrosectomy and percutaneous drainage  Plan: Continue Protonix 40mg  BID for one month, followed by 40 mg once daily.    Recommend outpatient follow-up with patient's primary gastroenterologist ([facility]) to further discuss need for Protonix at that time.  If signs of re-bleeding occur (melena, hematemesis, decreased Hgb, rising BUN out of proportion to Cr), please recall [place].   [facility] will sign off.  Please contact us if we can be of any further assistance during this hospital stay.  Salley Slaughter PA-C 04/26/2020, 3:02 PM  Contact #  [phone]

## 2020-04-26 NOTE — Progress Notes (Signed)
PROGRESS NOTE    Katherine Oliver  MHD:[id number] DOB: 09-May-1946 DOA: 04/22/2020 PCP: [name], MD  Brief Narrative:Katherine Oliver a 74 y.o.femalewithhistory of diabetes mellitus type 2 hypertension A. fib DVT recently admitted from December 13, 2019 up to February 24, 2020 for acute pancreatitis and complications. Patient was initially admitted on December 13, 2019 for presumed gallstone pancreatitis patient had undergone ERCP which showed biliary sludge with successful placement of biliary stent. However patient developed acute renal failure requiring CRRT. Patient also developed A. fib with RVR. Since patient started having worsening abdominal pain nausea and distention with evidence of new peripancreatic fluid collection despite CBD stent patient was transferred to [facility] for further care where patient underwent cystogastrostomy placement and double pigtail stents endoscopic necrosectomy and percutaneous drainage of the cyst. During the hospitalization patient also developed gastroduodenal artery bleeding and required embolization. Patient also was started on Lovenox for DVT. Patient was discharged to [facility] on February 24, 2020. With drains in place. Patient was [facility] from the [facility] on April 19, 2020 about 4 days ago and the home health nurse noticed that patient has been pale looking. Patient states she also has been having some nausea vomiting with dark stools for the last 2 weeks. Patient has a gastrostomy tube which only patient states she takes certain medications but most of the feeds he is taking orally for the last 2 weeks. Denies taking any NSAIDs. Patient had followed up with her PCP and labs showed hemoglobin to be around 4 was referred to the ER. In the ER patient's labs showed hemoglobin of around 4.4 and complete metabolic panel showed glucose of 502 with anion gap of 16 bicarb of 16. Patient was started on IV insulin infusion  for DKA 2 units of PRBC transfusion was ordered for acute GI bleed and patient was transferred to [facility] for the need for possible IR embolization if needed. On exam patient abdomen appears benign. Denies chest pain or shortness of breath. Assessment & Plan:   Principal Problem:   Acute GI bleeding Active Problems:   Benign hypertension   NAFLD (nonalcoholic fatty liver disease)   AF (paroxysmal atrial fibrillation) (HCC)   Chronic pancreatitis (HCC)   Gastrostomy in place ([place])   Acute blood loss anemia   DKA (diabetic ketoacidoses) ([place])   Type 2 diabetes mellitus with hyperglycemia, with long-term current use of insulin ([place])   Long term (current) use of insulin (HCC)   Hx of epistaxis   Aortic atherosclerosis ([place])    #1 GI bleed -patient admitted with anemia with history of recent gastroduodenal artery bleeding at [facility] requiring embolization.  Received 2 units of packed RBC and was started on IV Protonix.  CT angiogram showed no evidence of bleeding. EGD 04/24/2020 no active source of bleeding noted. Hemoglobin was still low at 7.4 she received another unit of transfusion on 04/25/2020.  She tells me that she is still having black stools.  Discussed with GI who will consider capsule endoscopy.  Continue to hold blood thinners.    #2 status post DKA now on Lantus 12 units. CBG (last 3)  Recent Labs    04/26/20 0424 04/26/20 0810 04/26/20 1153  GLUCAP 139* 191* 137*    #3 history of paroxysmal atrial fibrillation chronic rate controlled on metoprolol not on anticoagulation due to bleed .Marland Kitchen Appreciate cardiology input.  Given the high risk for recurrent bleed and with symptomatic anemia ongoing bleeding hemoglobin of 4.4 on  admission it is not recommended to restart anticoagulation as the risk outweigh the benefits.  Patient is aware of this.  Appreciate Dr. William Dalton input.  #4 essential hypertension blood pressure 133/76 continue metoprolol.    #5  hypokalemia/hypomagnesemia- resolved  potassium 4.1 magnesium 1.8.    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  Patient has been tolerating p.o. intake.  #8  anemia chronic likely due to GI blood loss.  Hematology consulted does not think patient has bone marrow dysfunction. Anemia panel with low iron.  I will give her 1 dose of Feraheme.  Appreciate Dr. Libby Maw input.  Estimated body mass index is 27.3 kg/m as calculated from the following:   Height as of this encounter: 5' (1.524 m).   Weight as of this encounter: 63.4 kg.  DVT prophylaxis: SCD  code Status: Full code Family Communication: None at bedside  disposition Plan:  Status is: Inpatient  Dispo: The patient is from: Home              Anticipated d/c is to unknown physical therapy evaluation pending.              Anticipated d/c date is: Unknown              Patient currently is not medically stable to d/c.  Patient is getting blood transfusion today.   Consultants: GI interventional radiology, cardiology Dr. Terri Skains oncology Dr. Lorenso Courier  Procedures: EGD 04/24/2020 Antimicrobials: None Subjective: She is awake alert tolerating p.o. intake continues to have dark bowel movements no nausea vomiting reported Objective: Vitals:   04/25/20 1711 04/25/20 2025 04/26/20 0018 04/26/20 0330  BP:  126/62 130/65 133/76  Pulse:  78 77 79  Resp: 16 16 16 16   Temp: 98.2 F (36.8 C) 98.5 F (36.9 C) 97.9 F (36.6 C) 97.9 F (36.6 C)  TempSrc: Oral Oral Oral Oral  SpO2:  95% 98% 99%  Weight:    63.4 kg  Height:        Intake/Output Summary (Last 24 hours) at 04/26/2020 1228 Last data filed at 04/25/2020 2017 Gross per 24 hour  Intake 658.52 ml  Output --  Net 658.52 ml   Filed Weights   04/23/20 2311 04/26/20 0330  Weight: 63.4 kg 63.4 kg    Examination:  General exam: Appears  calm and comfortable  Respiratory system: Clear to auscultation. Respiratory effort normal. Cardiovascular system: S1 & S2 heard, RRR. No JVD, murmurs, rubs, gallops or clicks. No pedal edema. Gastrointestinal system: Abdomen is nondistended, soft and nontender. No organomegaly or masses felt. Normal bowel sounds heard. Central nervous system: Alert and oriented. No focal neurological deficits.g tube in place Extremities: Symmetric 5 x 5 power. Skin: No rashes, lesions or ulcers Psychiatry: Judgement and insight appear normal. Mood & affect appropriate.     Data Reviewed: I have personally reviewed following labs and imaging studies  CBC: Recent Labs  Lab 04/22/20 1141 04/22/20 2357 04/23/20 0350 04/23/20 0350 04/23/20 0738 04/24/20 0900 04/25/20 0502 04/25/20 1530 04/25/20 2300 04/26/20 0446  WBC 9.7   < > 8.9  --  9.4 7.4 5.6  --   --  6.1  NEUTROABS 8.0*  --   --   --   --   --   --   --   --   --   HGB 4.4*   < >  7.4*   < > 7.8* 8.1* 7.4*  --  9.0* 9.3*  HCT 13.6*   < > 22.5*   < > 23.7* 25.0* 23.2*  --  28.0* 28.3*  MCV 86.6   < > 89.6  --  91.2 91.2 92.8  --   --  91.9  PLT 188   < > 170   < > 174 139* 146* 154  --  131*   < > = values in this interval not displayed.   Basic Metabolic Panel: Recent Labs  Lab 04/22/20 1656 04/22/20 2357 04/24/20 0358 04/25/20 0502 04/25/20 1149 04/26/20 0446  NA 133* 139 139 140  --  137  K 4.1 4.0 3.6 3.2*  --  4.1  CL 104 108 114* 113*  --  111  CO2 19* 21* 18* 20*  --  20*  GLUCOSE 420* 140* 203* 132*  --  138*  BUN 25* 21 14 10   --  12  CREATININE 0.93 0.79 0.76 0.73  --  0.85  CALCIUM 8.8* 9.4 8.6* 8.3*  --  8.3*  MG  --   --   --   --  1.1* 1.8   GFR: Estimated Creatinine Clearance: 47.6 mL/min (by C-G formula based on SCr of 0.85 mg/dL). Liver Function Tests: Recent Labs  Lab 04/22/20 1141 04/26/20 0446  AST 11* 11*  ALT 10 11  ALKPHOS 71 55  BILITOT 1.1 0.6  PROT 5.9* 4.4*  ALBUMIN 3.2* 2.2*   Recent  Labs  Lab 04/22/20 1142  LIPASE 38   No results for input(s): AMMONIA in the last 168 hours. Coagulation Profile: Recent Labs  Lab 04/22/20 1141 04/25/20 1530  INR 1.2 1.2   Cardiac Enzymes: No results for input(s): CKTOTAL, CKMB, CKMBINDEX, TROPONINI in the last 168 hours. BNP (last 3 results) No results for input(s): PROBNP in the last 8760 hours. HbA1C: No results for input(s): HGBA1C in the last 72 hours. CBG: Recent Labs  Lab 04/25/20 2024 04/26/20 0012 04/26/20 0424 04/26/20 0810 04/26/20 1153  GLUCAP 114* 136* 139* 191* 137*   Lipid Profile: No results for input(s): CHOL, HDL, LDLCALC, TRIG, CHOLHDL, LDLDIRECT in the last 72 hours. Thyroid Function Tests: No results for input(s): TSH, T4TOTAL, FREET4, T3FREE, THYROIDAB in the last 72 hours. Anemia Panel: Recent Labs    04/25/20 1530  VITAMINB12 773  FOLATE 13.0  FERRITIN 261  TIBC 202*  IRON 20*  RETICCTPCT 9.2*   Sepsis Labs: No results for input(s): PROCALCITON, LATICACIDVEN in the last 168 hours.  Recent Results (from the past 240 hour(s))  SARS Coronavirus 2 by RT PCR (hospital order, performed in [facility] hospital lab) Nasopharyngeal Nasopharyngeal Swab     Status: None   Collection Time: 04/22/20 12:02 PM   Specimen: Nasopharyngeal Swab  Result Value Ref Range Status   SARS Coronavirus 2 NEGATIVE NEGATIVE Final    Comment: (NOTE) SARS-CoV-2 target nucleic acids are NOT DETECTED.  The SARS-CoV-2 RNA is generally detectable in upper and lower respiratory specimens during the acute phase of infection. The lowest concentration of SARS-CoV-2 viral copies this assay can detect is 250 copies / mL. A negative result does not preclude SARS-CoV-2 infection and should not be used as the sole basis for treatment or other patient management decisions.  A negative result may occur with improper specimen collection / handling, submission of specimen other than nasopharyngeal swab, presence of viral  mutation(s) within the areas targeted by this assay, and inadequate number of viral copies (<  250 copies / mL). A negative result must be combined with clinical observations, patient history, and epidemiological information.  Fact Sheet for Patients:   StrictlyIdeas.no  Fact Sheet for Healthcare Providers: BankingDealers.co.za  This test is not yet approved or  cleared by the [place] FDA and has been authorized for detection and/or diagnosis of SARS-CoV-2 by FDA under an Emergency Use Authorization (EUA).  This EUA will remain in effect (meaning this test can be used) for the duration of the COVID-19 declaration under Section 564(b)(1) of the Act, 21 U.S.C. section 360bbb-3(b)(1), unless the authorization is terminated or revoked sooner.  Performed at [facility], [address], [place], [place] [address]          Radiology Studies: VAS [place] LOWER EXTREMITY VENOUS (DVT)  Result Date: 04/26/2020  Lower Venous DVTStudy Indications: Edema.  Comparison Study: No prior studies. Performing Technologist: Darlin Coco  Examination Guidelines: A complete evaluation includes B-mode imaging, spectral Doppler, color Doppler, and power Doppler as needed of all accessible portions of each vessel. Bilateral testing is considered an integral part of a complete examination. Limited examinations for reoccurring indications may be performed as noted. The reflux portion of the exam is performed with the patient in reverse Trendelenburg.  +---------+---------------+---------+-----------+----------+--------------+ RIGHT    CompressibilityPhasicitySpontaneityPropertiesThrombus Aging +---------+---------------+---------+-----------+----------+--------------+ CFV      Full           Yes      Yes                                 +---------+---------------+---------+-----------+----------+--------------+ SFJ      Full                                                         +---------+---------------+---------+-----------+----------+--------------+ FV Prox  None           Yes      Yes                  Acute          +---------+---------------+---------+-----------+----------+--------------+ FV Mid   Full                                                        +---------+---------------+---------+-----------+----------+--------------+ FV DistalFull                                                        +---------+---------------+---------+-----------+----------+--------------+ PFV      Partial        Yes      Yes                  Acute          +---------+---------------+---------+-----------+----------+--------------+ POP      Full           Yes      Yes                                 +---------+---------------+---------+-----------+----------+--------------+  PTV      Full                                                        +---------+---------------+---------+-----------+----------+--------------+ PERO     Full                                                        +---------+---------------+---------+-----------+----------+--------------+   +---------+---------------+---------+-----------+----------+--------------+ LEFT     CompressibilityPhasicitySpontaneityPropertiesThrombus Aging +---------+---------------+---------+-----------+----------+--------------+ CFV      Full           Yes      Yes                                 +---------+---------------+---------+-----------+----------+--------------+ SFJ      Full                                                        +---------+---------------+---------+-----------+----------+--------------+ FV Prox  Full                                                        +---------+---------------+---------+-----------+----------+--------------+ FV Mid   Full                                                         +---------+---------------+---------+-----------+----------+--------------+ FV DistalFull                                                        +---------+---------------+---------+-----------+----------+--------------+ PFV      Full                                                        +---------+---------------+---------+-----------+----------+--------------+ POP      Full           Yes      Yes                                 +---------+---------------+---------+-----------+----------+--------------+ PTV      Full                                                        +---------+---------------+---------+-----------+----------+--------------+  PERO     Full                                                        +---------+---------------+---------+-----------+----------+--------------+     Summary: RIGHT: - Findings consistent with acute deep vein thrombosis involving the right femoral vein, and right proximal profunda vein. - No cystic structure found in the popliteal fossa.  LEFT: - There is no evidence of deep vein thrombosis in the lower extremity.  - No cystic structure found in the popliteal fossa.  *See table(s) above for measurements and observations.    Preliminary         Scheduled Meds: . Chlorhexidine Gluconate Cloth  6 each Topical Daily  . diphenoxylate-atropine  2 tablet Oral QID  . insulin aspart  0-15 Units Subcutaneous Q4H  . insulin aspart  4 Units Subcutaneous TID WC  . insulin glargine  12 Units Subcutaneous Daily  . metoCLOPramide  10 mg Oral Q6H  . metoprolol tartrate  2.5 mg Intravenous Q6H  . pantoprazole (PROTONIX) IV  40 mg Intravenous Q12H   Continuous Infusions:    LOS: 4 days     Georgette Shell, MD  04/26/2020, 12:28 PM

## 2020-04-26 NOTE — Procedures (Signed)
Interventional Radiology Procedure:   Indications: Right leg DVT with anemia/GI bleeding  Procedure: IVC filter placement  Findings: [place] filter is positioned below renal veins  Complications: None     EBL: less than 10 ml  Plan: Bedrest 2 hour   Nimrat Woolworth R. Anselm Pancoast, MD  Pager: [phone]

## 2020-04-26 NOTE — Consult Note (Signed)
Chief Complaint: Patient was seen in consultation today for retrievable inferior vena cava filter placement at the request of Dr Kathreen Cosier   Supervising Physician: Markus Daft  Patient Status: [name] - In-pt  History of Present Illness: Katherine Oliver is a 74 y.o. female   Chart note: recent admission from 12/13/2019 to 02/24/2020 for acute pancreatitis and complications.  She is also had issues with atrial fibrillation and DVT requiring anticoagulation therapy.  During a portion of her hospitalization she was admitted to [facility].  She was discharged to SNF on 02/24/2020 and discharged from SNF on 04/19/2020.  Approximately 4 days ago the home health nurses noticed that the patient had become increasingly pale.  On check of the CBC the patient was noted to have a hemoglobin of 4.4.  Her glucose was 502 and bicarb was 16.  She was started on IV insulin infusion for DKA and received 2 units of packed red blood cells.  Due to concern for acute GI bleed she was transferred to [facility] for further evaluation management.  On 04/24/2020 the patient underwent an EGD which showed no clear source of her bleeding.  After review the labs, discussion with the patient, and reviewed the prior records the findings are most consistent with normocytic anemia secondary to bleeding.  The most likely etiology is a GI bleed.  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  - No cystic structure found in the popliteal fossa.  LEFT:  - There is no evidence of deep vein thrombosis in the lower extremity.  - No cystic structure found in the popliteal fossa.  MD requesting retrievable IVC filter Discussed with Dr Anselm Pancoast - he approves  procedure    Past Medical History:  Diagnosis Date  . Allergic rhinitis   . Arthritis   . 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 nephropathy ([place])    DSME at [facility] 01/2016   . Frequent epistaxis 05/16/2019   S/p cauterization with resolution 2020  . GERD (gastroesophageal reflux disease)   . Hepatic steatosis    by abd [place] 05/2012, mild transaminitis - normal iron sat and viral hep panel (2011), stable [place] 2017  . History of chicken pox   . History of measles   . History of recurrent UTIs    on chronic keflex  . HLD (hyperlipidemia)   . HTN (hypertension)   . 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 Kidney cyst, acquired 01/2016   L kidney by [place]  . Kidney stone 01/2016   L kidney by [place]  . Lung nodules 11/2013   overall stable on f/u CT 01/2016  . Osteopenia 06/2013   mild, forearm T -1.1, hip and spine WNL  . Pancreatitis   . Polycythemia    mild, stable (2013)  . Primary localized osteoarthritis of right knee 01/05/2019  . Rosacea    metrogel    Past Surgical History:  Procedure Laterality Date  . APPENDECTOMY  1987  . BILIARY STENT PLACEMENT  12/14/2019   Procedure: BILIARY STENT PLACEMENT;  Surgeon: [name],  MD;  Location: [place];  Service: Endoscopy;;  . BREAST BIOPSY Right 1963   benign  . BREAST BIOPSY Right 08/2014   benign- core  . cardiolite stress test  04/2004   normal  . CESAREAN SECTION  [id number]   x2  . CHOLECYSTECTOMY  2003  . COLONOSCOPY  09/26/2008   adenomatous polyp, rpt 3-5 yrs  . COLONOSCOPY  08/2012   adenomatous polyps, diverticulosis, rec rpt 5 yrs [name])  . COLONOSCOPY WITH PROPOFOL N/A 02/05/2018   4TA, SSA, diverticulosis, rpt 3 yrs [name], [name], MD)  . dexa  2003   normal  . dexa  06/2013   [facility] - Tscore -1.1 forearm, normal spine and femur  . ERCP N/A 12/14/2019    Procedure: ENDOSCOPIC RETROGRADE CHOLANGIOPANCREATOGRAPHY (ERCP);  Surgeon: [name], MD;  Location: [place];  Service: Endoscopy;  Laterality: N/A;  . ERCP  01/2020   nonbleeding gastric ulcer - [facility] hospitalization (Dr Mont Dutton)  . ESOPHAGOGASTRODUODENOSCOPY N/A 12/19/2019   Procedure: ESOPHAGOGASTRODUODENOSCOPY (EGD);  Surgeon: [name], MD;  Location[facility] ENDOSCOPY;  Service: Endoscopy;  Laterality: N/A;  . ESOPHAGOGASTRODUODENOSCOPY  01/2020   pre existing AXIOS cystogastrostomy stent s/p necrosectomy - [facility] hospitalization (Dr Mont Dutton)  . ESOPHAGOGASTRODUODENOSCOPY N/A 04/24/2020   Procedure: ESOPHAGOGASTRODUODENOSCOPY (EGD);  Surgeon: Wonda Horner, MD;  Location: [facility] ENDOSCOPY;  Service: Endoscopy;  Laterality: N/A;  . IR FLUORO GUIDE CV LINE RIGHT  12/27/2019  . IR FLUORO GUIDE CV LINE RIGHT  04/24/2020  . IR REMOVAL TUN CV CATH W/O FL  01/03/2020  . IR REPLC DUODEN/JEJUNO TUBE PERCUT W/FLUORO  04/01/2020  . IR US GUIDE VASC ACCESS RIGHT  04/24/2020  . SPHINCTEROTOMY  12/14/2019   Procedure: SPHINCTEROTOMY;  Surgeon: [name], MD;  Location: [place];  Service: Endoscopy;;  . 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 TRANSTHORACIC ECHOCARDIOGRAM  04/2019   EF 55-60%, modLVH, impaired relaxation   . TRIGGER FINGER RELEASE  2007;2010;2011   bilateral  . TRIGGER FINGER RELEASE  02/2017  . VAGINAL HYSTERECTOMY  1984   for menorrhagia, ovaries in place    Allergies: Azithromycin, Nickel, Sulfa antibiotics, Vinegar [acetic acid], and Adhesive [tape]  Medications: Prior to Admission medications   Medication Sig Start Date End Date Taking? Authorizing Provider  acetaminophen (TYLENOL) 325 MG tablet Take 650 mg by mouth every 6 (six) hours as needed for mild pain.   Yes [provider]  conjugated estrogens (PREMARIN) vaginal  cream Place 1 Applicatorful vaginally daily. Only to be applied topically Patient taking differently: Place 1 Applicatorful vaginally every other day. Only to be applied topically 03/14/20  Yes Nicole Kindred A, DO  enoxaparin (LOVENOX) 80 MG/0.8ML injection Inject 0.65 mLs (65 mg total) into the skin 2 (two) times daily. 03/13/20  Yes Nicole Kindred A, DO  insulin lispro (HUMALOG) 100 UNIT/ML injection Inject 4-12 Units into the skin 3 (three) times daily before meals. (according to sliding scale - max 36u daily)   Yes [provider]  LANTUS SOLOSTAR 100 UNIT/ML Solostar Pen Inject 12 Units into the skin at bedtime. 04/17/20  Yes [provider]  loperamide (IMODIUM A-D) 2 MG tablet Take 2 mg by mouth 4 (four) times daily as needed for diarrhea or loose stools.   Yes [provider]  meclizine (ANTIVERT) 25 MG tablet Take 1 tablet (25 mg total) by mouth 3 (three) times  daily as needed for nausea. 03/13/20  Yes Nicole Kindred A, DO  metoCLOPramide (REGLAN) 10 MG tablet Take 1 tablet (10 mg total) by mouth every 6 (six) hours. 03/13/20  Yes Nicole Kindred A, DO  ACCU-CHEK AVIVA PLUS test strip USE AS DIRECTED TO CHECK BLOOD SUGARS ONCE DAILY E11.21 04/17/20   [name], MD  acetaminophen (TYLENOL) 160 MG/5ML suspension Place 30.5 mg into feeding tube every 6 (six) hours as needed for mild pain or moderate pain.  Patient not taking: Reported on 04/23/2020    [provider]  amLODIPine Benzoate 1 MG/ML SUSP 2.5 mLs by Per J Tube route daily in the afternoon. Patient not taking: Reported on 04/23/2020    [provider]  benazepril (LOTENSIN) 20 MG tablet 20 mg by Per J Tube route daily. Patient not taking: Reported on 04/23/2020    [provider]  Multiple Vitamin (MULTIVITAMIN) tablet 1 tablet by Per J Tube route daily. Patient not taking: Reported on 04/23/2020    [provider]  Nutritional Supplements (FEEDING SUPPLEMENT, JEVITY  1.5 CAL/FIBER,) LIQD 1,000 mLs by Per J Tube route continuous. Goal rate 50 cc/hr, reduce by 10 cc/hr as needed if nausea occurs. Patient not taking: Reported on 04/23/2020 03/13/20   Ezekiel Slocumb, DO  Pancrelipase, Lip-Prot-Amyl, [phone] units TABS 2 tablets by Per J Tube route 3 (three) times daily with meals. Patient not taking: Reported on 04/23/2020    [provider]     Family History  Problem Relation Age of Onset  . Stroke Mother        several  . Hyperlipidemia Mother   . Hypertension Mother   . Cancer Father        colon  . Hypertension Father   . Hyperlipidemia Father   . Coronary artery disease Father 85       MIx1, CABG  . Cancer Paternal Aunt        abdominal  . Coronary artery disease Maternal Grandmother   . Diabetes Maternal Grandfather   . Coronary artery disease Maternal Grandfather   . Breast cancer Neg Hx     Social History   Socioeconomic History  . Marital status: Married    Spouse name: Not on file  . Number of children: Not on file  . Years of education: Not on file  . Highest education level: Not on file  Occupational History  . Not on file  Tobacco Use  . Smoking status: Never Smoker  . Smokeless tobacco: Never Used  Vaping Use  . Vaping Use: Never used  Substance and Sexual Activity  . Alcohol use: Not Currently    Alcohol/week: 1.0 - 2.0 standard drink    Types: 1 - 2 Glasses of wine per week    Comment: Occasional/1 mixed drink per month  . Drug use: No  . Sexual activity: Yes  Other Topics Concern  . 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 Daily walking about 20 min.    Diet: ok water intake 4 glasses/day, daily fruits/vegetables, red meat 4x/wk, fish 3-4x/wk   Social Determinants of Health    Financial Resource Strain: Low Risk   . Difficulty of Paying Living Expenses: Not hard at all  Food Insecurity: No Food Insecurity  .  Worried About Charity fundraiser in the Last Year: Never true  . Ran Out of Food in the Last Year: Never true  Transportation Needs: No Transportation Needs  . Lack of Transportation (Medical): No  . Lack of Transportation (Non-Medical): No  Physical Activity: Inactive  . Days of Exercise per Week: 0 days  . Minutes of Exercise per Session: 0 min  Stress: No Stress Concern Present  . Feeling of Stress : Not at all  Social Connections:   . Frequency of Communication with Friends and Family:   . Frequency of Social Gatherings with Friends and Family:   . Attends Religious Services:   . Active Member of Clubs or Organizations:   . Attends Archivist Meetings:   Marland Kitchen Marital Status:     Review of Systems: A 12 point ROS discussed and pertinent positives are indicated in the HPI above.  All other systems are negative.  Review of Systems  Constitutional: Positive for activity change and fatigue. Negative for fever and unexpected weight change.  Respiratory: Negative for cough and shortness of breath.   Gastrointestinal: Negative for abdominal pain.  Neurological: Positive for weakness.  Psychiatric/Behavioral: Negative for behavioral problems and confusion.    Vital Signs: BP 133/76 (BP Location: Right Arm)   Pulse 79   Temp 97.9 F (36.6 C) (Oral)   Resp 16   Ht 5' (1.524 m)   Wt 139 lb 12.4 oz (63.4 kg)   SpO2 99%   BMI 27.30 kg/m   Physical Exam Vitals reviewed.  Cardiovascular:     Rate and Rhythm: Normal rate and regular rhythm.     Heart sounds: Normal heart sounds.  Pulmonary:     Breath sounds: Normal breath sounds.  Abdominal:     Palpations: Abdomen is soft.  Musculoskeletal:        General: Normal range of motion.  Skin:    General: Skin is warm and dry.  Neurological:     Mental Status: She is alert and  oriented to person, place, and time.  Psychiatric:        Behavior: Behavior normal.     Imaging: IR Fluoro Guide CV Line Right  Result Date: 04/24/2020 INDICATION: GI BLEED, ACUTE BLOOD LOSS ANEMIA, POOR PERIPHERAL ACCESS EXAM: TUNNELED PICC LINE WITH ULTRASOUND AND FLUOROSCOPIC GUIDANCE MEDICATIONS: 1% lidocaine local. ANESTHESIA/SEDATION: Moderate Sedation Time:  None. The patient was continuously monitored during the procedure by the interventional radiology nurse under my direct supervision. FLUOROSCOPY TIME:  Fluoroscopy Time: 0 minutes 18 seconds (1 mGy). COMPLICATIONS: None immediate. PROCEDURE: Informed written consent was obtained from the patient after a discussion of the risks, benefits, and alternatives to treatment. Questions regarding the procedure were encouraged and answered. The right neck and chest were prepped with chlorhexidine in a sterile fashion, and a sterile drape was applied covering the operative field. Maximum barrier sterile technique with sterile gowns and gloves were used for the procedure. A timeout was performed prior to the initiation of the procedure. After creating a small venotomy incision, a micropuncture kit was utilized to access the right internal jugular vein under direct, real-time ultrasound guidance after the overlying soft tissues were anesthetized with 1% lidocaine with epinephrine. Ultrasound image documentation was performed. The microwire was kinked to measure appropriate catheter length. The micropuncture sheath was exchanged for a peel-away sheath over a guidewire. A 5 French dual lumen tunneled PICC measuring 27 cm was tunneled in a retrograde fashion from the anterior chest wall to the  venotomy incision. The catheter was then placed through the peel-away sheath with tip ultimately positioned at the superior caval-atrial junction. Final catheter positioning was confirmed and documented with a spot radiographic image. The catheter aspirates and flushes  normally. The catheter was flushed with appropriate volume heparin dwells. The catheter exit site was secured with a 2-0 Ethilon suture. The venotomy incision was closed with Dermabond. Dressings were applied. The patient tolerated the procedure well without immediate post procedural complication. FINDINGS: After catheter placement, the tip lies within the superior cavoatrial junction. The catheter aspirates and flushes normally and is ready for immediate use. IMPRESSION: Successful placement of dual lumen tunneled PICC catheter via the right internal jugular vein with tip terminating at the superior caval atrial junction. The catheter is ready for immediate use. Electronically Signed   By: Judie Petit.  Shick M.D.   On: 04/24/2020 10:36   IR Replc Duoden/Jejuno Tube Percut W/Fluoro  Result Date: 04/02/2020 INDICATION: Indwelling jejunostomy tube has become completely dislodged and requires replacement. EXAM: JEJUNOSTOMY TUBE REPLACEMENT UNDER FLUOROSCOPY MEDICATIONS: None ANESTHESIA/SEDATION: None CONTRAST:  20 mL Omnipaque 300-administered into the gastric lumen. FLUOROSCOPY TIME:  Fluoroscopy Time: 2 minutes and 42 seconds. 7.0 mGy. COMPLICATIONS: None immediate. PROCEDURE: Informed written consent was obtained from the patient after a thorough discussion of the procedural risks, benefits and alternatives. All questions were addressed. Maximal Sterile Barrier Technique was utilized including caps, mask, sterile gowns, sterile gloves, sterile drape, hand hygiene and skin antiseptic. A timeout was performed prior to the initiation of the procedure. A 5 French catheter was advanced through the pre-existing jejunostomy tract. Contrast injection was performed under fluoroscopy. A hydrophilic guidewire was advanced into the jejunum. Catheter and guidewire access were further advanced into the jejunum. A 14 French balloon retention jejunostomy catheter was then cut to appropriate length. The tube was advanced over the  hydrophilic guidewire. Catheter position was confirmed by fluoroscopy. The retention balloon was inflated with approximately 3-5 mL of saline. The tube was injected with contrast material and a fluoroscopic image obtained. FINDINGS: Successful access of the jejunum was accomplished via the pre-existing percutaneous tract and abdominal wall opening. The 52 French jejunostomy catheter was advanced well into the jejunum. IMPRESSION: Successful replacement of dislodged jejunostomy tube with placement of a new 14 French balloon retention jejunostomy catheter via the pre-existing abdominal wall opening and percutaneous tract. The tube extends well into the jejunum and is ready for use. Electronically Signed   By: Irish Lack M.D.   On: 04/02/2020 09:06   IR US Guide Vasc Access Right  Result Date: 04/24/2020 INDICATION: GI BLEED, ACUTE BLOOD LOSS ANEMIA, POOR PERIPHERAL ACCESS EXAM: TUNNELED PICC LINE WITH ULTRASOUND AND FLUOROSCOPIC GUIDANCE MEDICATIONS: 1% lidocaine local. ANESTHESIA/SEDATION: Moderate Sedation Time:  None. The patient was continuously monitored during the procedure by the interventional radiology nurse under my direct supervision. FLUOROSCOPY TIME:  Fluoroscopy Time: 0 minutes 18 seconds (1 mGy). COMPLICATIONS: None immediate. PROCEDURE: Informed written consent was obtained from the patient after a discussion of the risks, benefits, and alternatives to treatment. Questions regarding the procedure were encouraged and answered. The right neck and chest were prepped with chlorhexidine in a sterile fashion, and a sterile drape was applied covering the operative field. Maximum barrier sterile technique with sterile gowns and gloves were used for the procedure. A timeout was performed prior to the initiation of the procedure. After creating a small venotomy incision, a micropuncture kit was utilized to access the right internal jugular vein under direct, real-time ultrasound guidance  after the  overlying soft tissues were anesthetized with 1% lidocaine with epinephrine. Ultrasound image documentation was performed. The microwire was kinked to measure appropriate catheter length. The micropuncture sheath was exchanged for a peel-away sheath over a guidewire. A 5 French dual lumen tunneled PICC measuring 27 cm was tunneled in a retrograde fashion from the anterior chest wall to the venotomy incision. The catheter was then placed through the peel-away sheath with tip ultimately positioned at the superior caval-atrial junction. Final catheter positioning was confirmed and documented with a spot radiographic image. The catheter aspirates and flushes normally. The catheter was flushed with appropriate volume heparin dwells. The catheter exit site was secured with a 2-0 Ethilon suture. The venotomy incision was closed with Dermabond. Dressings were applied. The patient tolerated the procedure well without immediate post procedural complication. FINDINGS: After catheter placement, the tip lies within the superior cavoatrial junction. The catheter aspirates and flushes normally and is ready for immediate use. IMPRESSION: Successful placement of dual lumen tunneled PICC catheter via the right internal jugular vein with tip terminating at the superior caval atrial junction. The catheter is ready for immediate use. Electronically Signed   By: Jerilynn Mages.  Shick M.D.   On: 04/24/2020 10:36   VAS [place] LOWER EXTREMITY VENOUS (DVT)  Result Date: 04/26/2020  Lower Venous DVTStudy Indications: Edema.  Comparison Study: No prior studies. Performing Technologist: Darlin Coco  Examination Guidelines: A complete evaluation includes B-mode imaging, spectral Doppler, color Doppler, and power Doppler as needed of all accessible portions of each vessel. Bilateral testing is considered an integral part of a complete examination. Limited examinations for reoccurring indications may be performed as noted. The reflux portion of the exam is  performed with the patient in reverse Trendelenburg.  +---------+---------------+---------+-----------+----------+--------------+ RIGHT    CompressibilityPhasicitySpontaneityPropertiesThrombus Aging +---------+---------------+---------+-----------+----------+--------------+ CFV      Full           Yes      Yes                                 +---------+---------------+---------+-----------+----------+--------------+ SFJ      Full                                                        +---------+---------------+---------+-----------+----------+--------------+ FV Prox  None           Yes      Yes                  Acute          +---------+---------------+---------+-----------+----------+--------------+ FV Mid   Full                                                        +---------+---------------+---------+-----------+----------+--------------+ FV DistalFull                                                        +---------+---------------+---------+-----------+----------+--------------+ PFV  Partial        Yes      Yes                  Acute          +---------+---------------+---------+-----------+----------+--------------+ POP      Full           Yes      Yes                                 +---------+---------------+---------+-----------+----------+--------------+ PTV      Full                                                        +---------+---------------+---------+-----------+----------+--------------+ PERO     Full                                                        +---------+---------------+---------+-----------+----------+--------------+   +---------+---------------+---------+-----------+----------+--------------+ LEFT     CompressibilityPhasicitySpontaneityPropertiesThrombus Aging +---------+---------------+---------+-----------+----------+--------------+ CFV      Full           Yes      Yes                                  +---------+---------------+---------+-----------+----------+--------------+ SFJ      Full                                                        +---------+---------------+---------+-----------+----------+--------------+ FV Prox  Full                                                        +---------+---------------+---------+-----------+----------+--------------+ FV Mid   Full                                                        +---------+---------------+---------+-----------+----------+--------------+ FV DistalFull                                                        +---------+---------------+---------+-----------+----------+--------------+ PFV      Full                                                        +---------+---------------+---------+-----------+----------+--------------+ POP  Full           Yes      Yes                                 +---------+---------------+---------+-----------+----------+--------------+ PTV      Full                                                        +---------+---------------+---------+-----------+----------+--------------+ PERO     Full                                                        +---------+---------------+---------+-----------+----------+--------------+     Summary: RIGHT: - Findings consistent with acute deep vein thrombosis involving the right femoral vein, and right proximal profunda vein. - No cystic structure found in the popliteal fossa.  LEFT: - There is no evidence of deep vein thrombosis in the lower extremity.  - No cystic structure found in the popliteal fossa.  *See table(s) above for measurements and observations.    Preliminary    [place] EKG SITE RITE  Result Date: 04/23/2020 If Site Rite image not attached, placement could not be confirmed due to current cardiac rhythm.  [place] EKG SITE RITE  Result Date: 04/22/2020 If Site Rite image not attached, placement could not be  confirmed due to current cardiac rhythm.  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 History of pancreatic necrosectomy. EXAM: CT ANGIOGRAPHY ABDOMEN AND PELVIS WITH CONTRAST AND WITHOUT CONTRAST TECHNIQUE: Multidetector CT imaging of the abdomen and pelvis was performed using the standard protocol during bolus administration of intravenous contrast. Multiplanar reconstructed images and MIPs were obtained and reviewed to evaluate the vascular anatomy. CONTRAST:  15m OMNIPAQUE IOHEXOL 350 MG/ML SOLN COMPARISON:  CT abdomen and pelvis 01/11/2020 FINDINGS: VASCULAR Aorta: Small amount of atherosclerotic disease in the infrarenal abdominal aorta without dissection or aneurysm. No aortic stenosis. Celiac: Celiac trunk is widely patent. Left gastric artery and splenic artery are patent. No evidence for aneurysm or pseudoaneurysm involving the splenic artery. Common hepatic artery is tortuous with evidence for embolization of the gastroduodenal artery. Irregular of the hepatic arteries. Multiple small collateral vessels around the GDA distribution. SMA: Patent without evidence of aneurysm, dissection, vasculitis or significant stenosis. Renals: Both renal arteries are patent without evidence of aneurysm, dissection, vasculitis, fibromuscular dysplasia or significant stenosis. IMA: Patent without evidence of aneurysm, dissection, vasculitis or significant stenosis. Inflow: Patent without evidence of aneurysm, dissection, vasculitis or significant stenosis. Proximal Outflow: Proximal femoral arteries are patent bilaterally. Veins: Marked irregularity at the portal vein confluence. The main portal vein and intrahepatic portal veins are patent but there appears to be marked narrowing and irregularity of the portal confluence near the SMV. Splenic vein near the portal confluence appears to be  occluded. Varices or collaterals around the stomach compatible with the splenic vein occlusion. IVC and iliac veins are patent. Bilateral renal veins appear to be patent. Review of the MIP images confirms the above findings. NON-VASCULAR Lower  chest: Small bilateral pleural effusions, left side greater than right. Stable 3 mm nodule in the right middle lobe on sequence 6, image 3. This nodule has been stable since 12/02/2013 and compatible with a benign nodule. Compressive atelectasis adjacent to the small effusions. Hepatobiliary: Small amount of perihepatic ascites. Cholecystectomy. No significant biliary dilatation. Pancreas: There may be a small amount of residual pancreatic parenchyma near the head and uncinate process but very little pancreatic tissue is identified and compatible with history of necrotizing pancreatitis. 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 The large pancreatic pseudocyst collection has been decompressed. There is a small amount of residual fluid in the expected location of the pancreatic tail on sequence 16, image 30. This collection measures roughly 4.3 x 3.1 x 3.2 cm. There is a small amount of residual fluid around the stent within the central abdomen and this small collection is poorly characterized but has a maximum dimension of roughly 2.6 cm on sequence 19, image 60. Spleen: Small amount of perisplenic ascites. Adrenals/Urinary Tract: Normal appearance of the adrenal glands. Hyperdense exophytic structure in the left kidney lower pole that measures up to 2.1 cm and measured 1.7 cm on 01/11/2020. Hounsfield units do not significantly change on the post contrast images and likely represents a proteinaceous or hemorrhagic cyst with interval enlargement. Additional small left renal cysts. Tiny right renal cysts. No hydronephrosis. Urinary bladder is  unremarkable. No definite renal calculi. Additional hyperdense structures or calcifications in left kidney lower pole that are not compatible with stones. Stomach/Bowel: Interval placement of a cystogastrostomy stent extending from the stomach body into the central pancreatic pseudocyst. No clear evidence for active GI bleeding within the stomach. Patient now has a feeding jejunostomy tube. No evidence for active GI bleeding or contrast extravasation into the GI tract. Extensive mucosal enhancement involving the sigmoid colon and rectum. No evidence for bowel obstruction. Lymphatic: No significant lymph node enlargement in the abdomen or pelvis. Reproductive: Status post hysterectomy. No adnexal masses. Other: Small amount of ascites in the right lower quadrant. Small amount of perihepatic and perisplenic ascites. Ascites volume has markedly decreased since 01/11/2020. Musculoskeletal: No acute bone abnormality. IMPRESSION: VASCULAR 1. No evidence for active GI bleeding. No evidence for aneurysm or pseudoaneurysm to explain GI bleeding. 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 Main portal veins and superior mesenteric vein are patent. 4. Varices and collaterals around the stomach are related to the splenic vein occlusion. 5.  Aortic Atherosclerosis (ICD10-I70.0). NON-VASCULAR 1. 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 Small residual collection in the expected region of the pancreatic tail. 2. Small amount of ascites. 3. Bilateral renal cysts. Evidence for a hemorrhagic or proteinaceous cyst in left kidney lower pole as described. Additional hyperdense small structures in left kidney lower pole. No suspicious renal lesions. 4. Interval placement of a jejunal feeding tube. 5. Small bilateral pleural effusions. Electronically Signed   By: Markus Daft M.D.   On: 04/24/2020 12:23    Labs:  CBC: Recent  Labs    04/23/20 0738 04/23/20 0738 04/24/20 0900 04/25/20 0502 04/25/20 1530 04/25/20 2300 04/26/20 0446  WBC 9.4  --  7.4 5.6  --   --  6.1  HGB 7.8*   < > 8.1* 7.4*  --  9.0* 9.3*  HCT 23.7*   < >  25.0* 23.2*  --  28.0* 28.3*  PLT 174   < > 139* 146* 154  --  131*   < > = values in this interval not displayed.    COAGS: Recent Labs    03/06/20 1700 04/22/20 1141 04/25/20 1530  INR 1.0 1.2 1.2  APTT  --  27 29    BMP: Recent Labs    04/22/20 2357 04/24/20 0358 04/25/20 0502 04/26/20 0446  NA 139 139 140 137  K 4.0 3.6 3.2* 4.1  CL 108 114* 113* 111  CO2 21* 18* 20* 20*  GLUCOSE 140* 203* 132* 138*  BUN '21 14 10 12  '$ CALCIUM 9.4 8.6* 8.3* 8.3*  CREATININE 0.79 0.76 0.73 0.85  GFRNONAA >60 >60 >60 >60  GFRAA >60 >60 >60 >60    LIVER FUNCTION TESTS: Recent Labs    01/15/20 0430 03/06/20 1700 04/22/20 1141 04/26/20 0446  BILITOT 0.5 0.6 1.1 0.6  AST 25 17 11* 11*  ALT '23 24 10 11  '$ ALKPHOS 136* 235* 71 55  PROT 4.6* 7.6 5.9* 4.4*  ALBUMIN 1.8* 3.9 3.2* 2.2*    TUMOR MARKERS: No results for input(s): AFPTM, CEA, CA199, CHROMGRNA in the last 8760 hours.  Assessment and Plan:  Scheduled for retrievable inferior vena cava filter placement Risks and benefits discussed with the patient and family including, but not limited to bleeding, infection, contrast induced renal failure, filter fracture or migration which can lead to emergency surgery or even death, strut penetration with damage or irritation to adjacent structures and caval thrombosis.  All of the patient's questions were answered, patient is agreeable to proceed. Consent signed and in chart.   Thank you for this interesting consult.  I greatly enjoyed meeting Katherine Oliver and look forward to participating in their care.  A copy of this report was sent to the requesting provider on this date.  Electronically Signed: Lavonia Drafts, PA-C 04/26/2020, 1:37 PM   I spent a total of  20 Minutes    in face to face in clinical consultation, greater than 50% of which was counseling/coordinating care for retrievable IVC filter placement

## 2020-04-26 NOTE — Progress Notes (Signed)
Lower extremity venous bilateral study completed.   Preliminary results given to Belenda Cruise, RN.   See Cv Proc for preliminary results.   Darlin Coco

## 2020-04-26 NOTE — Progress Notes (Signed)
Per sonographer, pt is positive for small DVT Rt femoral vein.

## 2020-04-27 ENCOUNTER — Ambulatory Visit: Payer: Medicare Other | Admitting: Family Medicine

## 2020-04-27 LAB — BASIC METABOLIC PANEL
Anion gap: 7 (ref 5–15)
BUN: 11 mg/dL (ref 8–23)
CO2: 21 mmol/L — ABNORMAL LOW (ref 22–32)
Calcium: 8.4 mg/dL — ABNORMAL LOW (ref 8.9–10.3)
Chloride: 107 mmol/L (ref 98–111)
Creatinine, Ser: 0.89 mg/dL (ref 0.44–1.00)
GFR calc Af Amer: >60 mL/min (ref >60)
GFR calc non Af Amer: >60 mL/min (ref >60)
Glucose, Bld: 120 mg/dL — ABNORMAL HIGH (ref 70–99)
Potassium: 4 mmol/L (ref 3.5–5.1)
Sodium: 135 mmol/L (ref 135–145)

## 2020-04-27 LAB — HAPTOGLOBIN: Haptoglobin: 202 mg/dL (ref 42–346)

## 2020-04-27 LAB — GLUCOSE, CAPILLARY
Glucose-Capillary: 114 mg/dL — ABNORMAL HIGH (ref 70–99)
Glucose-Capillary: 245 mg/dL — ABNORMAL HIGH (ref 70–99)
Glucose-Capillary: 133 mg/dL — ABNORMAL HIGH (ref 70–99)
Glucose-Capillary: 102 mg/dL — ABNORMAL HIGH (ref 70–99)
Glucose-Capillary: 124 mg/dL — ABNORMAL HIGH (ref 70–99)

## 2020-04-27 MED ORDER — METOPROLOL TARTRATE 5 MG/5ML IV SOLN
2.5000 mg | Freq: Four times a day (QID) | INTRAVENOUS | Status: DC
  Administered 2020-04-27 – 2020-04-28 (×5): 2.5 mg via INTRAVENOUS
  Filled 2020-04-27 (×5): qty 5

## 2020-04-27 NOTE — Progress Notes (Signed)
Physical Therapy Treatment Patient Details Name: Katherine Oliver MRN: [id number] DOB: 04/05/1946 Today's Date: 04/27/2020    History of Present Illness Pt is a 74 y/o female admitted secondary to being pale and found to be anemic. GI was consulted and found no active source of bleeding. Of note, pt with recent complex medical hx including admitted on December 13, 2019 for presumed gallstone pancreatitis patient had undergone ERCP which showed biliary sludge with successful placement of biliary stent.  However patient developed acute renal failure requiring CRRT.  Patient also developed A. fib with RVR.  Since patient started having worsening abdominal pain nausea and distention with evidence of new peripancreatic fluid collection despite CBD stent patient was transferred to [facility] for further care where patient underwent cystogastrostomy placement and double pigtail stents endoscopic necrosectomy and percutaneous drainage of the cyst.  During the hospitalization patient also developed gastroduodenal artery bleeding and required embolization.  Patient also was started on Lovenox for DVT.  Patient was discharged to [facility] on February 24, 2020.  With drains in place.  Patient was discharged from the SNF on April 19, 2020 about 4 days ago and the home health nurse noticed that patient has been pale looking. Additional PMH including but not limited to DM and HTN.s/p 6/24 R LE DVT and IVC placement     PT Comments    Pt supine in bed in darkened room. Agreeable to opening blinds and ambulating with therapy to bathroom. After using the bathroom pt is agreeable to further ambulation in the room. Pt is limited in safe mobility by bilateral gastroc pain R>L in presence of decreased strength and endurance. Pt is currently supervision for bed mobility, and min guard for transfers and ambulation with RW. Pt will definitely benefit from being back in her home environment with HHPT. PT will  continue to follow acutely.   Follow Up Recommendations  Home health PT     Equipment Recommendations  None recommended by PT       Precautions / Restrictions Precautions Precautions: Fall Restrictions Weight Bearing Restrictions: No    Mobility  Bed Mobility Overal bed mobility: Needs Assistance Bed Mobility: Supine to Sit;Sit to Supine     Supine to sit: Supervision     General bed mobility comments: increased time and effort needed  Transfers Overall transfer level: Needs assistance Equipment used: Rolling walker (2 wheeled) Transfers: Sit to/from Stand Sit to Stand: Min guard         General transfer comment: good technique utilized, min guard for safety   Ambulation/Gait Ambulation/Gait assistance: Min guard Gait Distance (Feet): 50 Feet Assistive device: Rolling walker (2 wheeled) Gait Pattern/deviations: Step-through pattern;Decreased stride length Gait velocity: decreased   General Gait Details: pt with slow, steady gait; limited secondary to fatigue; no overt LOB or need for physical assistance, min guard for safety. VSS throughout         Balance Overall balance assessment: Needs assistance Sitting-balance support: Feet supported Sitting balance-Leahy Scale: Good     Standing balance support: Bilateral upper extremity supported;Single extremity supported Standing balance-Leahy Scale: Poor                              Cognition Arousal/Alertness: Awake/alert Behavior During Therapy: Flat affect Overall Cognitive Status: Within Functional Limits for tasks assessed  Exercises General Exercises - Lower Extremity Long Arc Quad: AROM;Both;10 reps;Seated Hip Flexion/Marching: AROM;Both;10 reps;Seated Toe Raises: AROM;10 reps;Seated Heel Raises: AROM;10 reps;Seated    General Comments General comments (skin integrity, edema, etc.): VSS       Pertinent Vitals/Pain Pain  Assessment: 0-10 Pain Score: 7  Pain Location: bilateral gastrocs Pain Descriptors / Indicators: Cramping Pain Intervention(s): Limited activity within patient's tolerance;Monitored during session;Repositioned           PT Goals (current goals can now be found in the care plan section) Acute Rehab PT Goals Patient Stated Goal: to return home and get better PT Goal Formulation: With patient/family Time For Goal Achievement: 05/09/20 Potential to Achieve Goals: Good Progress towards PT goals: Progressing toward goals    Frequency    Min 3X/week      PT Plan Current plan remains appropriate       AM-PAC PT "6 Clicks" Mobility   Outcome Measure  Help needed turning from your back to your side while in a flat bed without using bedrails?: None Help needed moving from lying on your back to sitting on the side of a flat bed without using bedrails?: None Help needed moving to and from a bed to a chair (including a wheelchair)?: None Help needed standing up from a chair using your arms (e.g., wheelchair or bedside chair)?: None Help needed to walk in hospital room?: A Little Help needed climbing 3-5 steps with a railing? : A Lot 6 Click Score: 21    End of Session Equipment Utilized During Treatment: Gait belt Activity Tolerance: Patient limited by fatigue Patient left: in bed;with call bell/phone within reach;with bed alarm set;with family/visitor present Nurse Communication: Mobility status PT Visit Diagnosis: Other abnormalities of gait and mobility (R26.89)     Time: 1155-2080 PT Time Calculation (min) (ACUTE ONLY): 19 min  Charges:  $Therapeutic Exercise: 8-22 mins                     Isaiah Cianci B. Migdalia Dk PT, DPT Acute Rehabilitation Services Pager [phone] Office [phone]    [place] 04/27/2020, 12:06 PM

## 2020-04-27 NOTE — Progress Notes (Signed)
PROGRESS NOTE    Katherine Oliver  GYJ:[id number] DOB: 1946/06/25 DOA: 04/22/2020 PCP: [name], MD  Brief Narrative:Katherine Oliver a 74 y.o.femalewithhistory of diabetes mellitus type 2 hypertension A. fib DVT recently admitted from December 13, 2019 up to February 24, 2020 for acute pancreatitis and complications. Patient was initially admitted on December 13, 2019 for presumed gallstone pancreatitis patient had undergone ERCP which showed biliary sludge with successful placement of biliary stent. However patient developed acute renal failure requiring CRRT. She also developed A. fib with RVR. Since patient started having worsening abdominal pain nausea and distention with evidence of new peripancreatic fluid collection despite CBD stent patient was transferred to [facility] for further care where patient underwent cystogastrostomy placement and double pigtail stents endoscopic necrosectomy and percutaneous drainage of the cyst. During the hospitalization patient also developed gastroduodenal artery bleeding and required embolization. She was started on Lovenox for DVT. Patient was discharged to SNF on April 23, 2021with drains in place. Patient was discharged from the SNF on April 19, 2020 about 4 days ago and the home health nurse noticed that patient has been pale looking. Patient states she also has been having some nausea vomiting with dark stools for the last 2 weeks. Patient has a gastrostomy tube which patient states she takes certain medications but most of the feeds she is taking orally for the last 2 weeks. Denies taking any NSAIDs. Patient had followed up with her PCP and labs showed hemoglobin to be around 4 was referred to the ER.   In the ER patient's labs showed hemoglobin of around 4.4 and complete metabolic panel showed glucose of 502 with anion gap of 16 bicarb of 16. Patient was started on IV insulin infusion for DKA. 2 units  of PRBC transfusion was ordered for acute GI bleed and patient was transferred to [facility] for the need for possible IR embolization if needed. On exam patient abdomen appears benign. Denies chest pain or shortness of breath.  Assessment & Plan:   Principal Problem:   Acute GI bleeding Active Problems:   Benign hypertension   NAFLD (nonalcoholic fatty liver disease)   AF (paroxysmal atrial fibrillation) (HCC)   Chronic pancreatitis (HCC)   Gastrostomy in place ([place])   Acute blood loss anemia   DKA (diabetic ketoacidoses) ([place])   Type 2 diabetes mellitus with hyperglycemia, with long-term current use of insulin ([place])   Long term (current) use of insulin (HCC)   Hx of epistaxis   Aortic atherosclerosis ([place])  #1 GI bleed -patient admitted with anemia with history of recent gastroduodenal artery bleeding at [facility] requiring embolization.  Received 2 units of packed RBC and was started on IV Protonix.  CT angiogram showed no evidence of bleeding. EGD 04/24/2020 no active source of bleeding noted. Hemoglobin was still low at 7.4 she received another unit of transfusion on 04/25/2020.  Hemoglobin yesterday was 9.3.  - Seen by GI yesterday.  They recommend Protonix 40 mg p.o. twice daily for 1 month followed by 40 mg once daily and outpatient follow-up with patient primary gastroenterologist at [facility].  #2 status post DKA now on Lantus 12 units. CBG (last 3)  Recent Labs    04/26/20 2348 04/27/20 0421 04/27/20 0755  GLUCAP 113* 114* 245*  -Continue to monitor Accu-Cheks, insulin sliding scale and adjust dose accordingly.  #3 history of paroxysmal atrial fibrillation chronic rate controlled on metoprolol not on anticoagulation due to bleed. Appreciate cardiology input.  Given the high risk for recurrent bleed and with symptomatic anemia ongoing bleeding hemoglobin of 4.4 on admission it is not recommended to restart anticoagulation as the risk outweigh the benefits.   Patient is aware of this.  Appreciate Dr. William Dalton input.  #4 essential hypertension.  Continue current medication.  Continue to monitor blood pressure closely and adjust medications as needed.  #5 hypokalemia/hypomagnesemia- resolved  potassium 4. Magnesium 1.8.  Monitor and replace as needed.  #6 DVT 3/21-repeat Doppler right lower extremity femoral DVT.  IR consulted.  She is status post IVC placement.  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  Patient has been tolerating p.o. intake.  #8  anemia chronic likely due to GI blood loss.  Hematology consulted does not think patient has bone marrow dysfunction. Anemia panel with low iron.  Given 1 dose of Feraheme.  Appreciate Dr. Libby Maw input.  Estimated body mass index is 29.02 kg/m as calculated from the following:   Height as of this encounter: 5' (1.524 m).   Weight as of this encounter: 67.4 kg.  DVT prophylaxis: SCD  code Status: Full code Family Communication: None at bedside  disposition Plan:  Status is: Inpatient  Dispo: The patient is from: Home              Anticipated d/c is to unknown physical therapy evaluation pending.              Anticipated d/c date is: 1-2 days              Patient currently is not medically stable to d/c.     Consultants: GI interventional radiology, cardiology Dr. Terri Skains. oncology Dr. Lorenso Courier  Procedures: EGD 04/24/2020 Antimicrobials: None Subjective: She is awake.  Denies having any nausea, vomiting, abdominal pain or blood in her stool. Objective: Vitals:   04/26/20 1710 04/26/20 1720 04/26/20 2151 04/27/20 0422  BP: (!) 153/69 (!) 147/70 (!) 147/70 128/74  Pulse: 84 89 88 74  Resp: 14 20 20 20   Temp:   98.1 F (36.7 C) 98.2 F (36.8 C)  TempSrc:   Oral Oral  SpO2: 93% 95% 96% 95%  Weight:    67.4 kg  Height:        Intake/Output Summary (Last 24 hours) at 04/27/2020 1136 Last data filed at 04/26/2020 1506 Gross per 24 hour    Intake 240 ml  Output 500 ml  Net -260 ml   Filed Weights   04/23/20 2311 04/26/20 0330 04/27/20 0422  Weight: 63.4 kg 63.4 kg 67.4 kg    Examination:  General exam: Appears calm and comfortable  Respiratory system: Clear to auscultation. Respiratory effort normal. Cardiovascular system: S1 & S2 heard, RRR. No JVD, murmurs, rubs, gallops or clicks. No pedal edema. Gastrointestinal system: Abdomen is nondistended, soft and nontender. No organomegaly or masses felt. Normal bowel sounds heard. Central nervous system: Alert and oriented. No focal neurological deficits. G tube in place Extremities: Symmetric 5 x 5 power. Skin: No rashes, lesions or ulcers Psychiatry: Judgement and insight appear normal. Mood & affect appropriate.     Data Reviewed: I have personally reviewed following labs and imaging studies  CBC: Recent Labs  Lab 04/22/20 1141 04/22/20 2357 04/23/20 0350 04/23/20 0350 04/23/20 0738 04/24/20 0900 04/25/20 0502 04/25/20 1530 04/25/20 2300 04/26/20 0446  WBC 9.7   < > 8.9  --  9.4 7.4 5.6  --   --  6.1  NEUTROABS 8.0*  --   --   --   --   --   --   --   --   --  HGB 4.4*   < > 7.4*   < > 7.8* 8.1* 7.4*  --  9.0* 9.3*  HCT 13.6*   < > 22.5*   < > 23.7* 25.0* 23.2*  --  28.0* 28.3*  MCV 86.6   < > 89.6  --  91.2 91.2 92.8  --   --  91.9  PLT 188   < > 170   < > 174 139* 146* 154  --  131*   < > = values in this interval not displayed.   Basic Metabolic Panel: Recent Labs  Lab 04/22/20 2357 04/24/20 0358 04/25/20 0502 04/25/20 1149 04/26/20 0446 04/27/20 0600  NA 139 139 140  --  137 135  K 4.0 3.6 3.2*  --  4.1 4.0  CL 108 114* 113*  --  111 107  CO2 21* 18* 20*  --  20* 21*  GLUCOSE 140* 203* 132*  --  138* 120*  BUN 21 14 10   --  12 11  CREATININE 0.79 0.76 0.73  --  0.85 0.89  CALCIUM 9.4 8.6* 8.3*  --  8.3* 8.4*  MG  --   --   --  1.1* 1.8  --    GFR: Estimated Creatinine Clearance: 46.8 mL/min (by C-G formula based on SCr of 0.89  mg/dL). Liver Function Tests: Recent Labs  Lab 04/22/20 1141 04/26/20 0446  AST 11* 11*  ALT 10 11  ALKPHOS 71 55  BILITOT 1.1 0.6  PROT 5.9* 4.4*  ALBUMIN 3.2* 2.2*   Recent Labs  Lab 04/22/20 1142  LIPASE 38   No results for input(s): AMMONIA in the last 168 hours. Coagulation Profile: Recent Labs  Lab 04/22/20 1141 04/25/20 1530  INR 1.2 1.2   Cardiac Enzymes: No results for input(s): CKTOTAL, CKMB, CKMBINDEX, TROPONINI in the last 168 hours. BNP (last 3 results) No results for input(s): PROBNP in the last 8760 hours. HbA1C: No results for input(s): HGBA1C in the last 72 hours. CBG: Recent Labs  Lab 04/26/20 1153 04/26/20 1822 04/26/20 2348 04/27/20 0421 04/27/20 0755  GLUCAP 137* 128* 113* 114* 245*   Lipid Profile: No results for input(s): CHOL, HDL, LDLCALC, TRIG, CHOLHDL, LDLDIRECT in the last 72 hours. Thyroid Function Tests: No results for input(s): TSH, T4TOTAL, FREET4, T3FREE, THYROIDAB in the last 72 hours. Anemia Panel: Recent Labs    04/25/20 1530  VITAMINB12 773  FOLATE 13.0  FERRITIN 261  TIBC 202*  IRON 20*  RETICCTPCT 9.2*   Sepsis Labs: No results for input(s): PROCALCITON, LATICACIDVEN in the last 168 hours.  Recent Results (from the past 240 hour(s))  SARS Coronavirus 2 by RT PCR (hospital order, performed in [facility] hospital lab) Nasopharyngeal Nasopharyngeal Swab     Status: None   Collection Time: 04/22/20 12:02 PM   Specimen: Nasopharyngeal Swab  Result Value Ref Range Status   SARS Coronavirus 2 NEGATIVE NEGATIVE Final    Comment: (NOTE) SARS-CoV-2 target nucleic acids are NOT DETECTED.  The SARS-CoV-2 RNA is generally detectable in upper and lower respiratory specimens during the acute phase of infection. The lowest concentration of SARS-CoV-2 viral copies this assay can detect is 250 copies / mL. A negative result does not preclude SARS-CoV-2 infection and should not be used as the sole basis for treatment or  other patient management decisions.  A negative result may occur with improper specimen collection / handling, submission of specimen other than nasopharyngeal swab, presence of viral mutation(s) within the areas targeted by this assay,  and inadequate number of viral copies (<250 copies / mL). A negative result must be combined with clinical observations, patient history, and epidemiological information.  Fact Sheet for Patients:   StrictlyIdeas.no  Fact Sheet for Healthcare Providers: BankingDealers.co.za  This test is not yet approved or  cleared by the [place] FDA and has been authorized for detection and/or diagnosis of SARS-CoV-2 by FDA under an Emergency Use Authorization (EUA).  This EUA will remain in effect (meaning this test can be used) for the duration of the COVID-19 declaration under Section 564(b)(1) of the Act, 21 U.S.C. section 360bbb-3(b)(1), unless the authorization is terminated or revoked sooner.  Performed at [facility], [address], [place], [place] [address]      Radiology Studies: IR IVC FILTER PLMT / S&I Burke Keels GUID/MOD SED  Result Date: 04/26/2020 INDICATION: 74 year old with history of necrotizing pancreatitis and right lower extremity DVT. Patient has anemia and concern for persistent GI bleeding. Request for IVC filter placement. EXAM: IVC FILTER PLACEMENT; IVC VENOGRAM; ULTRASOUND FOR VASCULAR ACCESS Physician: Stephan Minister. Anselm Pancoast, MD MEDICATIONS: Ancef 2 g ANESTHESIA/SEDATION: Fentanyl 25 mcg IV The patient was continuously monitored during the procedure by the interventional radiology nurse under my direct supervision. CONTRAST:  50 mL Omnipaque 300 FLUOROSCOPY TIME:  Fluoroscopy Time: 1 minute, 48 seconds, 34 mGy COMPLICATIONS: None immediate. PROCEDURE: The procedure was explained to the patient. The risks and benefits of the procedure were discussed and the patient's questions were addressed.  Informed consent was obtained from the patient. Ultrasound demonstrated a patent right internal jugular vein. Ultrasound images were obtained for documentation. The right neck was prepped and draped in a sterile fashion. Maximal barrier sterile technique was utilized including caps, mask, sterile gowns, sterile gloves, sterile drape, hand hygiene and skin antiseptic. The skin was anesthetized with 1% lidocaine. A 21 gauge needle was directed into the vein with ultrasound guidance and a micropuncture dilator set was placed. A wire was advanced into the IVC. The filter sheath was advanced over the wire into the IVC. An IVC venogram was performed. Fluoroscopic images were obtained for documentation. A Bard Denali filter was deployed below the lowest renal vein. A follow-up venogram was performed and the vascular sheath was removed with manual compression. FINDINGS: IVC was patent. Bilateral renal veins were identified. The filter was deployed below the lowest renal vein. Follow-up venogram confirmed placement within the IVC and below the renal veins. IMPRESSION: Successful placement of a retrievable IVC filter. PLAN: This IVC filter is potentially retrievable. The patient will be assessed for filter retrieval by Interventional Radiology in approximately 8-12 weeks. Further recommendations regarding filter retrieval, continued surveillance or declaration of device permanence, will be made at that time. Electronically Signed   By: Markus Daft M.D.   On: 04/26/2020 18:17   VAS [place] LOWER EXTREMITY VENOUS (DVT)  Result Date: 04/26/2020  Lower Venous DVTStudy Indications: Edema.  Comparison Study: No prior studies. Performing Technologist: Darlin Coco  Examination Guidelines: A complete evaluation includes B-mode imaging, spectral Doppler, color Doppler, and power Doppler as needed of all accessible portions of each vessel. Bilateral testing is considered an integral part of a complete examination. Limited examinations  for reoccurring indications may be performed as noted. The reflux portion of the exam is performed with the patient in reverse Trendelenburg.  +---------+---------------+---------+-----------+----------+--------------+ RIGHT    CompressibilityPhasicitySpontaneityPropertiesThrombus Aging +---------+---------------+---------+-----------+----------+--------------+ CFV      Full           Yes      Yes                                 +---------+---------------+---------+-----------+----------+--------------+  SFJ      Full                                                        +---------+---------------+---------+-----------+----------+--------------+ FV Prox  None           Yes      Yes                  Acute          +---------+---------------+---------+-----------+----------+--------------+ FV Mid   Full                                                        +---------+---------------+---------+-----------+----------+--------------+ FV DistalFull                                                        +---------+---------------+---------+-----------+----------+--------------+ PFV      Partial        Yes      Yes                  Acute          +---------+---------------+---------+-----------+----------+--------------+ POP      Full           Yes      Yes                                 +---------+---------------+---------+-----------+----------+--------------+ PTV      Full                                                        +---------+---------------+---------+-----------+----------+--------------+ PERO     Full                                                        +---------+---------------+---------+-----------+----------+--------------+   +---------+---------------+---------+-----------+----------+--------------+ LEFT     CompressibilityPhasicitySpontaneityPropertiesThrombus Aging  +---------+---------------+---------+-----------+----------+--------------+ CFV      Full           Yes      Yes                                 +---------+---------------+---------+-----------+----------+--------------+ SFJ      Full                                                        +---------+---------------+---------+-----------+----------+--------------+ FV Prox  Full                                                        +---------+---------------+---------+-----------+----------+--------------+  FV Mid   Full                                                        +---------+---------------+---------+-----------+----------+--------------+ FV DistalFull                                                        +---------+---------------+---------+-----------+----------+--------------+ PFV      Full                                                        +---------+---------------+---------+-----------+----------+--------------+ POP      Full           Yes      Yes                                 +---------+---------------+---------+-----------+----------+--------------+ PTV      Full                                                        +---------+---------------+---------+-----------+----------+--------------+ PERO     Full                                                        +---------+---------------+---------+-----------+----------+--------------+     Summary: RIGHT: - Findings consistent with acute deep vein thrombosis involving the right femoral vein, and right proximal profunda vein. - No cystic structure found in the popliteal fossa.  LEFT: - There is no evidence of deep vein thrombosis in the lower extremity.  - No cystic structure found in the popliteal fossa.  *See table(s) above for measurements and observations. Electronically signed by Servando Snare MD on 04/26/2020 at 9:03:48 PM.    Final         Scheduled Meds: .  Chlorhexidine Gluconate Cloth  6 each Topical Daily  . diphenoxylate-atropine  2 tablet Oral QID  . insulin aspart  0-15 Units Subcutaneous Q4H  . insulin aspart  4 Units Subcutaneous TID WC  . insulin glargine  12 Units Subcutaneous Daily  . metoCLOPramide  10 mg Oral Q6H  . metoprolol tartrate  2.5 mg Intravenous Q6H  . pantoprazole (PROTONIX) IV  40 mg Intravenous Q12H   Continuous Infusions:    LOS: 5 days     Yaakov Guthrie, MD Pager on amion  04/27/2020, 11:36 AM

## 2020-04-28 DIAGNOSIS — E1165 Type 2 diabetes mellitus with hyperglycemia: Secondary | ICD-10-CM

## 2020-04-28 LAB — BASIC METABOLIC PANEL
Anion gap: 10 (ref 5–15)
BUN: 13 mg/dL (ref 8–23)
CO2: 20 mmol/L — ABNORMAL LOW (ref 22–32)
Calcium: 8.5 mg/dL — ABNORMAL LOW (ref 8.9–10.3)
Chloride: 105 mmol/L (ref 98–111)
Creatinine, Ser: 0.86 mg/dL (ref 0.44–1.00)
GFR calc Af Amer: >60 mL/min (ref >60)
GFR calc non Af Amer: >60 mL/min (ref >60)
Glucose, Bld: 138 mg/dL — ABNORMAL HIGH (ref 70–99)
Potassium: 3.9 mmol/L (ref 3.5–5.1)
Sodium: 135 mmol/L (ref 135–145)

## 2020-04-28 LAB — CBC
HCT: 28.7 % — ABNORMAL LOW (ref 36.0–46.0)
HCT: 29.8 % — ABNORMAL LOW (ref 36.0–46.0)
Hemoglobin: 9.2 g/dL — ABNORMAL LOW (ref 12.0–15.0)
Hemoglobin: 9.8 g/dL — ABNORMAL LOW (ref 12.0–15.0)
MCH: 29.1 pg (ref 26.0–34.0)
MCH: 29.3 pg (ref 26.0–34.0)
MCHC: 32.1 g/dL (ref 30.0–36.0)
MCHC: 32.9 g/dL (ref 30.0–36.0)
MCV: 90.8 fL (ref 80.0–100.0)
MCV: 89.2 fL (ref 80.0–100.0)
Platelets: 123 10*3/uL — ABNORMAL LOW (ref 150–400)
Platelets: 147 10*3/uL — ABNORMAL LOW (ref 150–400)
RBC: 3.16 MIL/uL — ABNORMAL LOW (ref 3.87–5.11)
RBC: 3.34 MIL/uL — ABNORMAL LOW (ref 3.87–5.11)
RDW: 15.9 % — ABNORMAL HIGH (ref 11.5–15.5)
RDW: 15.6 % — ABNORMAL HIGH (ref 11.5–15.5)
WBC: 6.3 10*3/uL (ref 4.0–10.5)
WBC: 6.7 10*3/uL (ref 4.0–10.5)
nRBC: 0 % (ref 0.0–0.2)
nRBC: 0 % (ref 0.0–0.2)

## 2020-04-28 LAB — GLUCOSE, CAPILLARY
Glucose-Capillary: 111 mg/dL — ABNORMAL HIGH (ref 70–99)
Glucose-Capillary: 129 mg/dL — ABNORMAL HIGH (ref 70–99)
Glucose-Capillary: 196 mg/dL — ABNORMAL HIGH (ref 70–99)
Glucose-Capillary: 218 mg/dL — ABNORMAL HIGH (ref 70–99)
Glucose-Capillary: 88 mg/dL (ref 70–99)

## 2020-04-28 LAB — MAGNESIUM: Magnesium: 1.4 mg/dL — ABNORMAL LOW (ref 1.7–2.4)

## 2020-04-28 MED ORDER — METOPROLOL SUCCINATE ER 25 MG PO TB24
25.0000 mg | ORAL_TABLET | Freq: Every day | ORAL | 2 refills | Status: DC
Start: 2020-04-28 — End: 2020-06-04

## 2020-04-28 MED ORDER — FOLIC ACID 1 MG PO TABS
1.0000 mg | ORAL_TABLET | Freq: Every day | ORAL | 1 refills | Status: DC
Start: 2020-04-28 — End: 2020-07-02

## 2020-04-28 MED ORDER — FERROUS SULFATE 325 (65 FE) MG PO TABS: 325.0000 mg | ORAL_TABLET | Freq: Every day | ORAL | 1 refills | Status: DC

## 2020-04-28 MED ORDER — PANTOPRAZOLE SODIUM 40 MG PO TBEC: 40.0000 mg | DELAYED_RELEASE_TABLET | Freq: Every day | ORAL | 1 refills | Status: DC

## 2020-04-28 MED ORDER — PANTOPRAZOLE SODIUM 40 MG PO TBEC: 40.0000 mg | DELAYED_RELEASE_TABLET | Freq: Two times a day (BID) | ORAL | 0 refills | Status: DC

## 2020-04-28 MED ORDER — PANTOPRAZOLE SODIUM 40 MG PO TBEC: 40.0000 mg | DELAYED_RELEASE_TABLET | Freq: Every day | ORAL | Status: DC

## 2020-04-28 MED ORDER — MAGNESIUM SULFATE 2 GM/50ML IV SOLN
2.0000 g | Freq: Once | INTRAVENOUS | Status: AC
  Administered 2020-04-28: 2 g via INTRAVENOUS
  Filled 2020-04-28: qty 50

## 2020-04-28 MED ORDER — PANTOPRAZOLE SODIUM 40 MG PO TBEC: 40.0000 mg | DELAYED_RELEASE_TABLET | Freq: Two times a day (BID) | ORAL | Status: DC

## 2020-04-28 NOTE — Progress Notes (Signed)
Referral received to assist with HHPT and RN to draw CBC on Monday. Pt is active with [name]. Contacted Cassie with [name] and left a VM with referral.

## 2020-04-28 NOTE — Discharge Summary (Addendum)
Physician Discharge Summary  [name] WJX:[id number] DOB: Aug 20, 1946 DOA: 04/22/2020  PCP: [name], MD  Admit date: 04/22/2020 Discharge date: 04/28/2020  Admitted From: Home Disposition: Home  Recommendations for Outpatient Follow-up:  1. Follow up with PCP in 1-2 weeks 2. Please obtain BMP/CBC in one week   Home Health: Yes Equipment/Devices: No  Discharge Condition: stable CODE STATUS: Full Diet recommendation: Heart Healthy / Carb Modified  Brief/Interim Summary: Katherine Oliver a 74 y.o.femalewithhistory of diabetes mellitus type 2 hypertension A. fib DVT recently admitted from December 13, 2019 up to February 24, 2020 for acute pancreatitis and complications. Patient was initially admitted on December 13, 2019 for presumed gallstone pancreatitis. Katherine Oliver had undergone ERCP which showed biliary sludge with successful placement of biliary stent. However Katherine Oliver developed acute renal failure requiring CRRT. Katherine Oliver also developed A. fib with RVR. Since patient started having worsening abdominal pain nausea and distention with evidence of new peripancreatic fluid collection despite CBD stent patient was transferred to [facility] for further care where patient underwent cystogastrostomy placement and double pigtail stents endoscopic necrosectomy and percutaneous drainage of the cyst. During the hospitalization patient also developed gastroduodenal artery bleeding and required embolization. Katherine Oliver was started on Lovenox for DVT. Patient was discharged to SNF on April 23, 2021with drains in place. Patient was discharged from the SNF on April 19, 2020  and the home health nurse noticed that patient has been pale looking. Patient states Katherine Oliver also had some nausea vomiting with dark stools for 2 weeks. Patient has a gastrostomy tube which patient states Katherine Oliver takes certain medications but most of the feeds Katherine Oliver is taking orally. Denies taking any NSAIDs.  Patient had followed up with her PCP and labs showed hemoglobin to be around 4 was referred to the ER.   In the ER patient's labs showed hemoglobin of around 4.4 and complete metabolic panel showed glucose of 502 with anion gap of 16 bicarb of 16. Patient was started on IV insulin infusion for DKA. 2 units of PRBC transfusion was ordered for acute GI bleed and patient was transferred to [facility] for the need for possible IR embolization if needed.   Discharge Diagnoses:  Principal Problem:   Acute GI bleeding Active Problems:   Benign hypertension   NAFLD (nonalcoholic fatty liver disease)   AF (paroxysmal atrial fibrillation) (Katherine Oliver)   Chronic pancreatitis (Katherine Oliver)   Gastrostomy in place (Katherine Oliver)   Acute blood loss anemia   DKA (diabetic ketoacidoses) (Katherine Oliver)   Type 2 diabetes mellitus with hyperglycemia, with long-term current use of insulin (Katherine Oliver)   Long term (current) use of insulin (Katherine Oliver)   Hx of epistaxis   Aortic atherosclerosis (Katherine Oliver)  #1 GI bleed -patient admitted with anemia with history of recent gastroduodenal artery bleeding at [facility] requiring embolization.  Received 2 units of packed RBC and was started on IV Protonix.  CT angiogram showed no evidence of bleeding. EGD 04/24/2020 no active source of bleeding noted. Hemoglobin was still low at 7.4 Katherine Oliver received another unit of transfusion on 04/25/2020.  Hemoglobin this morning 9.8.  - Seen by GI.  They recommend Protonix 40 mg p.o. twice daily for 1 month followed by 40 mg once daily and outpatient follow-up with patient primary gastroenterologist at [facility]. -Patient reportedly was on tube feeds at the facility but at home Katherine Oliver was not taking her tube feeds or the pancrelipase.  Will defer management to the primary care physician. -Katherine Oliver is also on Reglan  for some reason.  Advised her to discuss with her primary care physician regarding risk versus benefits of long-term use of Reglan.  #2 status post DKA stable on  Lantus 12 units daily along with ISS. CBG (last 3)  Recent Labs (last 2 labs)        Recent Labs    04/26/20 2348 04/27/20 0421 04/27/20 0755  GLUCAP 113* 114* 245*    -Advised to follow-up with her outpatient physician.  #3 history of paroxysmal atrial fibrillation chronic rate controlled on metoprolol not on anticoagulation due to high risk of bleed. Appreciate cardiology input.  Given the high risk for recurrent bleed and with symptomatic anemia ongoing bleeding hemoglobin of 4.4 on admission patient did not want to start anticoagulation as the risk outweigh the benefits.  Discussed with the patient and her husband about her risk of stroke if Katherine Oliver is not on anticoagulation.  They understand but at this point they opted not to start anticoagulation due to high risk of bleeding.  #4 essential hypertension. Here Katherine Oliver is getting treatment with metoprolol and blood pressure is stable.  Will continue with the metoprolol.  Home medications indicated Norvasc and benazepril which the patient was not taking because her blood pressure at home was low. Katherine Oliver has home health nurse set up who can check her blood pressure.  Advised the patient to follow-up with her primary care physician within a week to have her blood pressure monitored in the outpatient setting and medications adjusted accordingly.  #5 hypokalemia/hypomagnesemia-replaced.  #6 DVT 3/21-repeat Doppler right lower extremity femoral DVT.  IR consulted.  Katherine Oliver is status post IVC placement.  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  Patient has been tolerating p.o. intake.  #8  anemia chronic likely due to GI blood loss.  Hematology consulted does not think patient has bone marrow dysfunction. Anemia panel with low iron.  Given 1 dose of Feraheme.  Hemoglobin is stable at this time.  Per hematology will discharge on FeSO4 once a day and folic acid daily.   Discharge  Instructions Discharge plan of care discussed with the patient and her husband at the bedside.  Answered all of her questions appropriately to the best of my knowledge.  They verbalized understanding and Katherine Oliver is wanting to be discharged home.  Allergies as of 04/28/2020      Reactions   Azithromycin Itching   Okay if takes benadryl along with it   Nickel    Reaction to cheap earrings   Sulfa Antibiotics    Itching.    Vinegar [acetic Acid]    Nausea.    Adhesive [tape] Rash   Paper tape - blisters      Medication List    STOP taking these medications   amLODIPine Benzoate 1 MG/ML Susp   benazepril 20 MG tablet Commonly known as: LOTENSIN   enoxaparin 80 MG/0.8ML injection Commonly known as: LOVENOX   feeding supplement (JEVITY 1.5 CAL/FIBER) Liqd   Pancrelipase (Lip-Prot-Amyl) 19622-29798 units Tabs     TAKE these medications   Accu-Chek Aviva Plus test strip Generic drug: glucose blood USE AS DIRECTED TO CHECK BLOOD SUGARS ONCE DAILY E11.21   acetaminophen 325 MG tablet Commonly known as: TYLENOL Take 650 mg by mouth every 6 (six) hours as needed for mild pain. What changed: Another medication with the same name was removed. Continue taking this medication, and follow the directions you see here.   conjugated estrogens vaginal cream Commonly known  as: PREMARIN Place 1 Applicatorful vaginally daily. Only to be applied topically What changed: when to take this   ferrous sulfate 325 (65 FE) MG tablet Take 1 tablet (325 mg total) by mouth daily with breakfast.   folic acid 1 MG tablet Commonly known as: FOLVITE Take 1 tablet (1 mg total) by mouth daily.   insulin lispro 100 UNIT/ML injection Commonly known as: HUMALOG Inject 4-12 Units into the skin 3 (three) times daily before meals. (according to sliding scale - max 36u daily)   Lantus SoloStar 100 UNIT/ML Solostar Pen Generic drug: insulin glargine Inject 12 Units into the skin at bedtime.   loperamide 2  MG tablet Commonly known as: IMODIUM A-D Take 2 mg by mouth 4 (four) times daily as needed for diarrhea or loose stools.   meclizine 25 MG tablet Commonly known as: ANTIVERT Take 1 tablet (25 mg total) by mouth 3 (three) times daily as needed for nausea.   metoCLOPramide 10 MG tablet Commonly known as: REGLAN Take 1 tablet (10 mg total) by mouth every 6 (six) hours.   metoprolol succinate 25 MG 24 hr tablet Commonly known as: Toprol XL Take 1 tablet (25 mg total) by mouth daily.   multivitamin tablet 1 tablet by Per J Tube route daily.   pantoprazole 40 MG tablet Commonly known as: PROTONIX Take 1 tablet (40 mg total) by mouth 2 (two) times daily.   pantoprazole 40 MG tablet Commonly known as: PROTONIX Take 1 tablet (40 mg total) by mouth daily. Start taking on: May 29, 2020       Follow-up Information    [name] Follow up.   Specialty: Home Health Services Why: Physical Therapy, Occupational Therapy, Registered Nurse- active with [name]-office to call with visit times.  Contact information: [place] [address] [phone]              Allergies  Allergen Reactions  . Azithromycin Itching    Okay if takes benadryl along with it  . Nickel     Reaction to cheap earrings  . Sulfa Antibiotics     Itching.   [name] [Acetic Acid]     Nausea.   . Adhesive [Tape] Rash    Paper tape - blisters    Consultations:  Critical care  Gastroenterology  Cardiology  Heme/oncology  Radiology   Procedures/Studies: IR IVC FILTER PLMT / S&I Burke Keels GUID/MOD SED  Result Date: 04/26/2020 INDICATION: 74 year old with history of necrotizing pancreatitis and right lower extremity DVT. Patient has anemia and concern for persistent GI bleeding. Request for IVC filter placement. EXAM: IVC FILTER PLACEMENT; IVC VENOGRAM; ULTRASOUND FOR VASCULAR ACCESS Physician: Stephan Minister. Anselm Pancoast, MD MEDICATIONS: Ancef 2 g ANESTHESIA/SEDATION: Fentanyl 25  mcg IV The patient was continuously monitored during the procedure by the interventional radiology nurse under my direct supervision. CONTRAST:  50 mL Omnipaque 300 FLUOROSCOPY TIME:  Fluoroscopy Time: 1 minute, 48 seconds, 34 mGy COMPLICATIONS: None immediate. PROCEDURE: The procedure was explained to the patient. The risks and benefits of the procedure were discussed and the patient's questions were addressed. Informed consent was obtained from the patient. Ultrasound demonstrated a patent right internal jugular vein. Ultrasound images were obtained for documentation. The right neck was prepped and draped in a sterile fashion. Maximal barrier sterile technique was utilized including caps, mask, sterile gowns, sterile gloves, sterile drape, hand hygiene and skin antiseptic. The skin was anesthetized with 1% lidocaine. A 21 gauge needle was directed into the vein  with ultrasound guidance and a micropuncture dilator set was placed. A wire was advanced into the IVC. The filter sheath was advanced over the wire into the IVC. An IVC venogram was performed. Fluoroscopic images were obtained for documentation. A Bard Denali filter was deployed below the lowest renal vein. A follow-up venogram was performed and the vascular sheath was removed with manual compression. FINDINGS: IVC was patent. Bilateral renal veins were identified. The filter was deployed below the lowest renal vein. Follow-up venogram confirmed placement within the IVC and below the renal veins. IMPRESSION: Successful placement of a retrievable IVC filter. PLAN: This IVC filter is potentially retrievable. The patient will be assessed for filter retrieval by Interventional Radiology in approximately 8-12 weeks. Further recommendations regarding filter retrieval, continued surveillance or declaration of device permanence, will be made at that time. Electronically Signed   By: Markus Daft M.D.   On: 04/26/2020 18:17   IR Fluoro Guide CV Line Right  Result  Date: 04/24/2020 INDICATION: GI BLEED, ACUTE BLOOD LOSS ANEMIA, POOR PERIPHERAL ACCESS EXAM: TUNNELED PICC LINE WITH ULTRASOUND AND FLUOROSCOPIC GUIDANCE MEDICATIONS: 1% lidocaine local. ANESTHESIA/SEDATION: Moderate Sedation Time:  None. The patient was continuously monitored during the procedure by the interventional radiology nurse under my direct supervision. FLUOROSCOPY TIME:  Fluoroscopy Time: 0 minutes 18 seconds (1 mGy). COMPLICATIONS: None immediate. PROCEDURE: Informed written consent was obtained from the patient after a discussion of the risks, benefits, and alternatives to treatment. Questions regarding the procedure were encouraged and answered. The right neck and chest were prepped with chlorhexidine in a sterile fashion, and a sterile drape was applied covering the operative field. Maximum barrier sterile technique with sterile gowns and gloves were used for the procedure. A timeout was performed prior to the initiation of the procedure. After creating a small venotomy incision, a micropuncture kit was utilized to access the right internal jugular vein under direct, real-time ultrasound guidance after the overlying soft tissues were anesthetized with 1% lidocaine with epinephrine. Ultrasound image documentation was performed. The microwire was kinked to measure appropriate catheter length. The micropuncture sheath was exchanged for a peel-away sheath over a guidewire. A 5 French dual lumen tunneled PICC measuring 27 cm was tunneled in a retrograde fashion from the anterior chest wall to the venotomy incision. The catheter was then placed through the peel-away sheath with tip ultimately positioned at the superior caval-atrial junction. Final catheter positioning was confirmed and documented with a spot radiographic image. The catheter aspirates and flushes normally. The catheter was flushed with appropriate volume heparin dwells. The catheter exit site was secured with a 2-0 Ethilon suture. The  venotomy incision was closed with Dermabond. Dressings were applied. The patient tolerated the procedure well without immediate post procedural complication. FINDINGS: After catheter placement, the tip lies within the superior cavoatrial junction. The catheter aspirates and flushes normally and is ready for immediate use. IMPRESSION: Successful placement of dual lumen tunneled PICC catheter via the right internal jugular vein with tip terminating at the superior caval atrial junction. The catheter is ready for immediate use. Electronically Signed   By: Jerilynn Mages.  Shick M.D.   On: 04/24/2020 10:36   IR Replc Duoden/Jejuno Tube Percut W/Fluoro  Result Date: 04/02/2020 INDICATION: Indwelling jejunostomy tube has become completely dislodged and requires replacement. EXAM: JEJUNOSTOMY TUBE REPLACEMENT UNDER FLUOROSCOPY MEDICATIONS: None ANESTHESIA/SEDATION: None CONTRAST:  20 mL Omnipaque 300-administered into the gastric lumen. FLUOROSCOPY TIME:  Fluoroscopy Time: 2 minutes and 42 seconds. 7.0 mGy. COMPLICATIONS: None immediate. PROCEDURE: Informed written consent  was obtained from the patient after a thorough discussion of the procedural risks, benefits and alternatives. All questions were addressed. Maximal Sterile Barrier Technique was utilized including caps, mask, sterile gowns, sterile gloves, sterile drape, hand hygiene and skin antiseptic. A timeout was performed prior to the initiation of the procedure. A 5 French catheter was advanced through the pre-existing jejunostomy tract. Contrast injection was performed under fluoroscopy. A hydrophilic guidewire was advanced into the jejunum. Catheter and guidewire access were further advanced into the jejunum. A 14 French balloon retention jejunostomy catheter was then cut to appropriate length. The tube was advanced over the hydrophilic guidewire. Catheter position was confirmed by fluoroscopy. The retention balloon was inflated with approximately 3-5 mL of saline. The  tube was injected with contrast material and a fluoroscopic image obtained. FINDINGS: Successful access of the jejunum was accomplished via the pre-existing percutaneous tract and abdominal wall opening. The 59 French jejunostomy catheter was advanced well into the jejunum. IMPRESSION: Successful replacement of dislodged jejunostomy tube with placement of a new 58 French balloon retention jejunostomy catheter via the pre-existing abdominal wall opening and percutaneous tract. The tube extends well into the jejunum and is ready for use. Electronically Signed   By: Aletta Edouard M.D.   On: 04/02/2020 09:06   IR US Guide Vasc Access Right  Result Date: 04/24/2020 INDICATION: GI BLEED, ACUTE BLOOD LOSS ANEMIA, POOR PERIPHERAL ACCESS EXAM: TUNNELED PICC LINE WITH ULTRASOUND AND FLUOROSCOPIC GUIDANCE MEDICATIONS: 1% lidocaine local. ANESTHESIA/SEDATION: Moderate Sedation Time:  None. The patient was continuously monitored during the procedure by the interventional radiology nurse under my direct supervision. FLUOROSCOPY TIME:  Fluoroscopy Time: 0 minutes 18 seconds (1 mGy). COMPLICATIONS: None immediate. PROCEDURE: Informed written consent was obtained from the patient after a discussion of the risks, benefits, and alternatives to treatment. Questions regarding the procedure were encouraged and answered. The right neck and chest were prepped with chlorhexidine in a sterile fashion, and a sterile drape was applied covering the operative field. Maximum barrier sterile technique with sterile gowns and gloves were used for the procedure. A timeout was performed prior to the initiation of the procedure. After creating a small venotomy incision, a micropuncture kit was utilized to access the right internal jugular vein under direct, real-time ultrasound guidance after the overlying soft tissues were anesthetized with 1% lidocaine with epinephrine. Ultrasound image documentation was performed. The microwire was kinked to  measure appropriate catheter length. The micropuncture sheath was exchanged for a peel-away sheath over a guidewire. A 5 French dual lumen tunneled PICC measuring 27 cm was tunneled in a retrograde fashion from the anterior chest wall to the venotomy incision. The catheter was then placed through the peel-away sheath with tip ultimately positioned at the superior caval-atrial junction. Final catheter positioning was confirmed and documented with a spot radiographic image. The catheter aspirates and flushes normally. The catheter was flushed with appropriate volume heparin dwells. The catheter exit site was secured with a 2-0 Ethilon suture. The venotomy incision was closed with Dermabond. Dressings were applied. The patient tolerated the procedure well without immediate post procedural complication. FINDINGS: After catheter placement, the tip lies within the superior cavoatrial junction. The catheter aspirates and flushes normally and is ready for immediate use. IMPRESSION: Successful placement of dual lumen tunneled PICC catheter via the right internal jugular vein with tip terminating at the superior caval atrial junction. The catheter is ready for immediate use. Electronically Signed   By: Jerilynn Mages.  Shick M.D.   On: 04/24/2020 10:36  VAS [place] LOWER EXTREMITY VENOUS (DVT)  Result Date: 04/26/2020  Lower Venous DVTStudy Indications: Edema.  Comparison Study: No prior studies. Performing Technologist: Darlin Coco  Examination Guidelines: A complete evaluation includes B-mode imaging, spectral Doppler, color Doppler, and power Doppler as needed of all accessible portions of each vessel. Bilateral testing is considered an integral part of a complete examination. Limited examinations for reoccurring indications may be performed as noted. The reflux portion of the exam is performed with the patient in reverse Trendelenburg.  +---------+---------------+---------+-----------+----------+--------------+ RIGHT     CompressibilityPhasicitySpontaneityPropertiesThrombus Aging +---------+---------------+---------+-----------+----------+--------------+ CFV      Full           Yes      Yes                                 +---------+---------------+---------+-----------+----------+--------------+ SFJ      Full                                                        +---------+---------------+---------+-----------+----------+--------------+ FV Prox  None           Yes      Yes                  Acute          +---------+---------------+---------+-----------+----------+--------------+ FV Mid   Full                                                        +---------+---------------+---------+-----------+----------+--------------+ FV DistalFull                                                        +---------+---------------+---------+-----------+----------+--------------+ PFV      Partial        Yes      Yes                  Acute          +---------+---------------+---------+-----------+----------+--------------+ POP      Full           Yes      Yes                                 +---------+---------------+---------+-----------+----------+--------------+ PTV      Full                                                        +---------+---------------+---------+-----------+----------+--------------+ PERO     Full                                                        +---------+---------------+---------+-----------+----------+--------------+   +---------+---------------+---------+-----------+----------+--------------+  LEFT     CompressibilityPhasicitySpontaneityPropertiesThrombus Aging +---------+---------------+---------+-----------+----------+--------------+ CFV      Full           Yes      Yes                                 +---------+---------------+---------+-----------+----------+--------------+ SFJ      Full                                                         +---------+---------------+---------+-----------+----------+--------------+ FV Prox  Full                                                        +---------+---------------+---------+-----------+----------+--------------+ FV Mid   Full                                                        +---------+---------------+---------+-----------+----------+--------------+ FV DistalFull                                                        +---------+---------------+---------+-----------+----------+--------------+ PFV      Full                                                        +---------+---------------+---------+-----------+----------+--------------+ POP      Full           Yes      Yes                                 +---------+---------------+---------+-----------+----------+--------------+ PTV      Full                                                        +---------+---------------+---------+-----------+----------+--------------+ PERO     Full                                                        +---------+---------------+---------+-----------+----------+--------------+     Summary: RIGHT: - Findings consistent with acute deep vein thrombosis involving the right femoral vein, and right proximal profunda vein. - No cystic structure found in the popliteal fossa.  LEFT: - There is no evidence of deep vein thrombosis in the lower  extremity.  - No cystic structure found in the popliteal fossa.  *See table(s) above for measurements and observations. Electronically signed by Servando Snare MD on 04/26/2020 at 9:03:48 PM.    Final    [place] EKG SITE RITE  Result Date: 04/23/2020 If Site Rite image not attached, placement could not be confirmed due to current cardiac rhythm.  [place] EKG SITE RITE  Result Date: 04/22/2020 If Site Rite image not attached, placement could not be confirmed due to current cardiac rhythm.  CT Angio Abd/Pel w/ and/or  w/o  Result Date: 04/24/2020 CLINICAL DATA:  74 year old with history of necrotizing pancreatitis and intermittent coffee ground emesis and melena. History of pancreatic necrosectomy. EXAM: CT ANGIOGRAPHY ABDOMEN AND PELVIS WITH CONTRAST AND WITHOUT CONTRAST TECHNIQUE: Multidetector CT imaging of the abdomen and pelvis was performed using the standard protocol during bolus administration of intravenous contrast. Multiplanar reconstructed images and MIPs were obtained and reviewed to evaluate the vascular anatomy. CONTRAST:  180m OMNIPAQUE IOHEXOL 350 MG/ML SOLN COMPARISON:  CT abdomen and pelvis 01/11/2020 FINDINGS: VASCULAR Aorta: Small amount of atherosclerotic disease in the infrarenal abdominal aorta without dissection or aneurysm. No aortic stenosis. Celiac: Celiac trunk is widely patent. Left gastric artery and splenic artery are patent. No evidence for aneurysm or pseudoaneurysm involving the splenic artery. Common hepatic artery is tortuous with evidence for embolization of the gastroduodenal artery. Irregular of the hepatic arteries. Multiple small collateral vessels around the GDA distribution. SMA: Patent without evidence of aneurysm, dissection, vasculitis or significant stenosis. Renals: Both renal arteries are patent without evidence of aneurysm, dissection, vasculitis, fibromuscular dysplasia or significant stenosis. IMA: Patent without evidence of aneurysm, dissection, vasculitis or significant stenosis. Inflow: Patent without evidence of aneurysm, dissection, vasculitis or significant stenosis. Proximal Outflow: Proximal femoral arteries are patent bilaterally. Veins: Marked irregularity at the portal vein confluence. The main portal vein and intrahepatic portal veins are patent but there appears to be marked narrowing and irregularity of the portal confluence near the SMV. Splenic vein near the portal confluence appears to be occluded. Varices or collaterals around the stomach compatible with  the splenic vein occlusion. IVC and iliac veins are patent. Bilateral renal veins appear to be patent. Review of the MIP images confirms the above findings. NON-VASCULAR Lower chest: Small bilateral pleural effusions, left side greater than right. Stable 3 mm nodule in the right middle lobe on sequence 6, image 3. This nodule has been stable since 12/02/2013 and compatible with a benign nodule. Compressive atelectasis adjacent to the small effusions. Hepatobiliary: Small amount of perihepatic ascites. Cholecystectomy. No significant biliary dilatation. Pancreas: There may be a small amount of residual pancreatic parenchyma near the head and uncinate process but very little pancreatic tissue is identified and compatible with history of necrotizing pancreatitis. 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 The large pancreatic pseudocyst collection has been decompressed. There is a small amount of residual fluid in the expected location of the pancreatic tail on sequence 16, image 30. This collection measures roughly 4.3 x 3.1 x 3.2 cm. There is a small amount of residual fluid around the stent within the central abdomen and this small collection is poorly characterized but has a maximum dimension of roughly 2.6 cm on sequence 19, image 60. Spleen: Small amount of perisplenic ascites. Adrenals/Urinary Tract: Normal appearance of the adrenal glands. Hyperdense exophytic structure in the left kidney lower pole that measures  up to 2.1 cm and measured 1.7 cm on 01/11/2020. Hounsfield units do not significantly change on the post contrast images and likely represents a proteinaceous or hemorrhagic cyst with interval enlargement. Additional small left renal cysts. Tiny right renal cysts. No hydronephrosis. Urinary bladder is unremarkable. No definite renal calculi. Additional hyperdense structures  or calcifications in left kidney lower pole that are not compatible with stones. Stomach/Bowel: Interval placement of a cystogastrostomy stent extending from the stomach body into the central pancreatic pseudocyst. No clear evidence for active GI bleeding within the stomach. Patient now has a feeding jejunostomy tube. No evidence for active GI bleeding or contrast extravasation into the GI tract. Extensive mucosal enhancement involving the sigmoid colon and rectum. No evidence for bowel obstruction. Lymphatic: No significant lymph node enlargement in the abdomen or pelvis. Reproductive: Status post hysterectomy. No adnexal masses. Other: Small amount of ascites in the right lower quadrant. Small amount of perihepatic and perisplenic ascites. Ascites volume has markedly decreased since 01/11/2020. Musculoskeletal: No acute bone abnormality. IMPRESSION: VASCULAR 1. No evidence for active GI bleeding. No evidence for aneurysm or pseudoaneurysm to explain GI bleeding. 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 Main portal veins and superior mesenteric vein are patent. 4. Varices and collaterals around the stomach are related to the splenic vein occlusion. 5.  Aortic Atherosclerosis (ICD10-I70.0). NON-VASCULAR 1. 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 Small residual collection in the expected region of the pancreatic tail. 2. Small amount of ascites. 3. Bilateral renal cysts. Evidence for a hemorrhagic or proteinaceous cyst in left kidney lower pole as described. Additional hyperdense small structures in left kidney lower pole. No suspicious renal lesions. 4. Interval placement of a jejunal feeding tube. 5. Small bilateral pleural effusions. Electronically Signed   By: Markus Daft M.D.   On: 04/24/2020 12:23     Subjective: Katherine Oliver denies any major complaints at this time.  Discharge Exam: Vitals:    04/28/20 0754 04/28/20 1153  BP: 136/68 129/70  Pulse: 83 81  Resp: 16 16  Temp: 98.7 F (37.1 C) 98.9 F (37.2 C)  SpO2: 95% 94%   Vitals:   04/27/20 2015 04/28/20 0439 04/28/20 0754 04/28/20 1153  BP: (!) 151/69 (!) 154/74 136/68 129/70  Pulse: 83 79 83 81  Resp: _0 Temp: 99 F (37.2 C) 98.1 F (36.7 C) 98.7 F (37.1 C) 98.9 F (37.2 C)  TempSrc: Oral Oral Oral Oral  SpO2: 95% 95% 95% 94%  Weight:  63.3 kg    Height:        General: Pt is alert, awake, not in acute distress Cardiovascular: RRR, S1/S2 +, no rubs, no gallops Respiratory: CTA bilaterally, no wheezing, no rhonchi Abdominal: Soft, NT, ND, bowel sounds + Extremities: no edema, no cyanosis    The results of significant diagnostics from this hospitalization (including imaging, microbiology, ancillary and laboratory) are listed below for reference.     Microbiology: Recent Results (from the past 240 hour(s))  SARS Coronavirus 2 by RT PCR (hospital order, performed in [facility] hospital lab) Nasopharyngeal Nasopharyngeal Swab     Status: None   Collection Time: 04/22/20 12:02 PM   Specimen: Nasopharyngeal Swab  Result Value Ref Range Status   SARS Coronavirus 2 NEGATIVE NEGATIVE Final    Comment: (NOTE) SARS-CoV-2 target nucleic acids are NOT DETECTED.  The SARS-CoV-2 RNA is generally detectable in upper and lower respiratory specimens  during the acute phase of infection. The lowest concentration of SARS-CoV-2 viral copies this assay can detect is 250 copies / mL. A negative result does not preclude SARS-CoV-2 infection and should not be used as the sole basis for treatment or other patient management decisions.  A negative result may occur with improper specimen collection / handling, submission of specimen other than nasopharyngeal swab, presence of viral mutation(s) within the areas targeted by this assay, and inadequate number of viral copies (<250 copies / mL). A negative result must be  combined with clinical observations, patient history, and epidemiological information.  Fact Sheet for Patients:   StrictlyIdeas.no  Fact Sheet for Healthcare Providers: BankingDealers.co.za  This test is not yet approved or  cleared by the [place] FDA and has been authorized for detection and/or diagnosis of SARS-CoV-2 by FDA under an Emergency Use Authorization (EUA).  This EUA will remain in effect (meaning this test can be used) for the duration of the COVID-19 declaration under Section 564(b)(1) of the Act, 21 U.S.C. section 360bbb-3(b)(1), unless the authorization is terminated or revoked sooner.  Performed at [facility], [address] [place], [place] [address]      Labs: BNP (last 3 results) Recent Labs    12/15/19 0900  BNP 86.7   Basic Metabolic Panel: Recent Labs  Lab 04/24/20 0358 04/25/20 0502 04/25/20 1149 04/26/20 0446 04/27/20 0600 04/28/20 0500  NA 139 140  --  137 135 135  K 3.6 3.2*  --  4.1 4.0 3.9  CL 114* 113*  --  111 107 105  CO2 18* 20*  --  20* 21* 20*  GLUCOSE 203* 132*  --  138* 120* 138*  BUN 14 10  --  _0 CREATININE 0.76 0.73  --  0.85 0.89 0.86  CALCIUM 8.6* 8.3*  --  8.3* 8.4* 8.5*  MG  --   --  1.1* 1.8  --  1.4*   Liver Function Tests: Recent Labs  Lab 04/22/20 1141 04/26/20 0446  AST 11* 11*  ALT 10 11  ALKPHOS 71 55  BILITOT 1.1 0.6  PROT 5.9* 4.4*  ALBUMIN 3.2* 2.2*   Recent Labs  Lab 04/22/20 1142  LIPASE 38   No results for input(s): AMMONIA in the last 168 hours. CBC: Recent Labs  Lab 04/22/20 1141 04/22/20 2357 04/24/20 0900 04/24/20 0900 04/25/20 0502 04/25/20 1530 04/25/20 2300 04/26/20 0446 04/28/20 0500 04/28/20 1229  WBC 9.7   < > 7.4  --  5.6  --   --  6.1 6.3 6.7  NEUTROABS 8.0*  --   --   --   --   --   --   --   --   --   HGB 4.4*   < > 8.1*   < > 7.4*  --  9.0* 9.3* 9.2* 9.8*  HCT 13.6*   < > 25.0*   < > 23.2*  --   28.0* 28.3* 28.7* 29.8*  MCV 86.6   < > 91.2  --  92.8  --   --  91.9 90.8 89.2  PLT 188   < > 139*   < > 146* 154  --  131* 123* 147*   < > = values in this interval not displayed.   Cardiac Enzymes: No results for input(s): CKTOTAL, CKMB, CKMBINDEX, TROPONINI in the last 168 hours. BNP: Invalid input(s): POCBNP CBG: Recent Labs  Lab 04/27/20 2014 04/28/20 0017 04/28/20 0437 04/28/20 0752 04/28/20 1155  GLUCAP 124* 88 111* 218* 129*   D-Dimer Recent Labs    04/25/20 1530  DDIMER 1.70*   Hgb A1c No results for input(s): HGBA1C in the last 72 hours. Lipid Profile No results for input(s): CHOL, HDL, LDLCALC, TRIG, CHOLHDL, LDLDIRECT in the last 72 hours. Thyroid function studies No results for input(s): TSH, T4TOTAL, T3FREE, THYROIDAB in the last 72 hours.  Invalid input(s): FREET3 Anemia work up Recent Labs    04/25/20 1530  VITAMINB12 773  FOLATE 13.0  FERRITIN 261  TIBC 202*  IRON 20*  RETICCTPCT 9.2*   Urinalysis    Component Value Date/Time   COLORURINE YELLOW (A) 03/06/2020 2303   APPEARANCEUR TURBID (A) 03/06/2020 2303   APPEARANCEUR Cloudy (A) 12/12/2019 1332   LABSPEC 1.021 03/06/2020 2303   PHURINE 5.0 03/06/2020 2303   GLUCOSEU NEGATIVE 03/06/2020 2303   HGBUR NEGATIVE 03/06/2020 2303   BILIRUBINUR NEGATIVE 03/06/2020 2303   BILIRUBINUR Negative 12/12/2019 1332   KETONESUR NEGATIVE 03/06/2020 2303   PROTEINUR 30 (A) 03/06/2020 2303   UROBILINOGEN 0.2 02/12/2018 0805   NITRITE NEGATIVE 03/06/2020 2303   LEUKOCYTESUR TRACE (A) 03/06/2020 2303   Sepsis Labs Invalid input(s): PROCALCITONIN,  WBC,  LACTICIDVEN Microbiology Recent Results (from the past 240 hour(s))  SARS Coronavirus 2 by RT PCR (hospital order, performed in [place] hospital lab) Nasopharyngeal Nasopharyngeal Swab     Status: None   Collection Time: 04/22/20 12:02 PM   Specimen: Nasopharyngeal Swab  Result Value Ref Range Status   SARS Coronavirus 2 NEGATIVE NEGATIVE Final     Comment: (NOTE) SARS-CoV-2 target nucleic acids are NOT DETECTED.  The SARS-CoV-2 RNA is generally detectable in upper and lower respiratory specimens during the acute phase of infection. The lowest concentration of SARS-CoV-2 viral copies this assay can detect is 250 copies / mL. A negative result does not preclude SARS-CoV-2 infection and should not be used as the sole basis for treatment or other patient management decisions.  A negative result may occur with improper specimen collection / handling, submission of specimen other than nasopharyngeal swab, presence of viral mutation(s) within the areas targeted by this assay, and inadequate number of viral copies (<250 copies / mL). A negative result must be combined with clinical observations, patient history, and epidemiological information.  Fact Sheet for Patients:   StrictlyIdeas.no  Fact Sheet for Healthcare Providers: BankingDealers.co.za  This test is not yet approved or  cleared by the [place] FDA and has been authorized for detection and/or diagnosis of SARS-CoV-2 by FDA under an Emergency Use Authorization (EUA).  This EUA will remain in effect (meaning this test can be used) for the duration of the COVID-19 declaration under Section 564(b)(1) of the Act, 21 U.S.C. section 360bbb-3(b)(1), unless the authorization is terminated or revoked sooner.  Performed at [facility], [address] [place], [place] [address]      Time coordinating discharge: Over 30 minutes  SIGNED:   Yaakov Guthrie, MD  [facility] 04/28/2020, 3:15 PM Pager on amion  If 7PM-7AM, please contact night-coverage [email or web address] Password [email or web address]

## 2020-04-28 NOTE — Progress Notes (Signed)
Consult received to remove PICC. PICC is tunneled and placed by IR. IR to remove. Myriam Jacobson, RN informed.

## 2020-04-29 ENCOUNTER — Other Ambulatory Visit (HOSPITAL_COMMUNITY): Payer: Self-pay | Admitting: Interventional Radiology

## 2020-04-29 ENCOUNTER — Ambulatory Visit (HOSPITAL_COMMUNITY)
Admission: RE | Admit: 2020-04-29 | Discharge: 2020-04-29 | Disposition: A | Discharge status: Home / Self Care | Payer: Medicare Other | Source: Ambulatory Visit | Attending: Interventional Radiology | Admitting: Interventional Radiology

## 2020-04-29 DIAGNOSIS — E119 Type 2 diabetes mellitus without complications: Secondary | ICD-10-CM | POA: Diagnosis not present

## 2020-04-29 DIAGNOSIS — Z452 Encounter for adjustment and management of vascular access device: Secondary | ICD-10-CM | POA: Diagnosis not present

## 2020-04-29 DIAGNOSIS — Z794 Long term (current) use of insulin: Secondary | ICD-10-CM | POA: Diagnosis not present

## 2020-04-29 HISTORY — PX: IR REMOVAL TUN CV CATH W/O FL: IMG2289

## 2020-04-29 LAB — TYPE AND SCREEN
ABO/RH(D): B POS
Antibody Screen: POSITIVE
Unit division: 0
Unit division: 0

## 2020-04-29 LAB — BPAM RBC
Blood Product Expiration Date: 202107082359
Blood Product Expiration Date: 202107082359
ISSUE DATE / TIME: 202106231648
Unit Type and Rh: 7300
Unit Type and Rh: 7300

## 2020-04-30 ENCOUNTER — Other Ambulatory Visit: Payer: Self-pay

## 2020-04-30 ENCOUNTER — Encounter: Payer: Self-pay | Admitting: Family Medicine

## 2020-04-30 ENCOUNTER — Telehealth: Payer: Self-pay

## 2020-04-30 NOTE — Consult Note (Signed)
   [facility] Inpatient Consult   04/30/2020  Katherine Oliver [facility] 10/02/1946 [id number]  [facility] ACO Patient:  Medicare NextGen ACO Extreme high risk score Follow up for restart of services:  Hospital  Assigned back to [place] Management.  Natividad Brood, RN BSN [facility] Liaison  [phone] business mobile phone Toll free office [phone]  Fax number: [phone] [name]

## 2020-04-30 NOTE — Telephone Encounter (Signed)
Transition Care Management Follow-up Telephone Call  Date of discharge and from where: 04/28/2020, Katherine Oliver  How have you been since you were released from the hospital? Patient states that she feels better.  Any questions or concerns? No   Items Reviewed:  Did the pt receive and understand the discharge instructions provided? Yes   Medications obtained and verified? Yes   Any new allergies since your discharge? No   Dietary orders reviewed? Yes  Do you have support at home? Yes   Functional Questionnaire: (I = Independent and D = Dependent) ADLs: I  Bathing/Dressing- I  Meal Prep- I  Eating- I  Maintaining continence- I  Transferring/Ambulation- I  Managing Meds- I  Follow up appointments reviewed:   PCP Hospital f/u appt confirmed? Yes  Scheduled to see Dr. Danise Mina on 05/02/2020 @ 12:45 pm.  [facility] f/u appt confirmed? Scheduled GI   Are transportation arrangements needed? No   If their condition worsens, is the pt aware to call PCP or go to the Emergency Dept.? Yes  Was the patient provided with contact information for the PCP's office or ED? Yes  Was to pt encouraged to call back with questions or concerns? Yes

## 2020-05-01 ENCOUNTER — Telehealth: Payer: Self-pay

## 2020-05-01 DIAGNOSIS — K8591 Acute pancreatitis with uninfected necrosis, unspecified: Secondary | ICD-10-CM | POA: Diagnosis not present

## 2020-05-01 DIAGNOSIS — K8050 Calculus of bile duct without cholangitis or cholecystitis without obstruction: Secondary | ICD-10-CM | POA: Diagnosis not present

## 2020-05-01 DIAGNOSIS — I4891 Unspecified atrial fibrillation: Secondary | ICD-10-CM | POA: Diagnosis not present

## 2020-05-01 DIAGNOSIS — M6281 Muscle weakness (generalized): Secondary | ICD-10-CM | POA: Diagnosis not present

## 2020-05-01 DIAGNOSIS — D62 Acute posthemorrhagic anemia: Secondary | ICD-10-CM | POA: Diagnosis not present

## 2020-05-01 DIAGNOSIS — K280 Acute gastrojejunal ulcer with hemorrhage: Secondary | ICD-10-CM | POA: Diagnosis not present

## 2020-05-01 NOTE — Telephone Encounter (Signed)
Lvm for Amira informing her Dr. Darnell Level is giving verbal orders for services requested.

## 2020-05-01 NOTE — Telephone Encounter (Signed)
Agree with this. Thanks.  

## 2020-05-01 NOTE — Telephone Encounter (Signed)
Incoming call from [place] with [name] requesting VO for [facility] PT  2x a week for 1 week 1x a week for 1 week 2x a week for 2 weeks  Please advise

## 2020-05-02 ENCOUNTER — Ambulatory Visit (INDEPENDENT_AMBULATORY_CARE_PROVIDER_SITE_OTHER): Payer: Medicare Other | Admitting: Family Medicine

## 2020-05-02 ENCOUNTER — Encounter: Payer: Self-pay | Admitting: Family Medicine

## 2020-05-02 ENCOUNTER — Other Ambulatory Visit: Payer: Self-pay | Admitting: *Deleted

## 2020-05-02 ENCOUNTER — Other Ambulatory Visit: Payer: Self-pay

## 2020-05-02 VITALS — BP 134/78 | HR 92 | Temp 97.8°F | Ht 60.0 in | Wt 140.0 lb

## 2020-05-02 DIAGNOSIS — K8591 Acute pancreatitis with uninfected necrosis, unspecified: Secondary | ICD-10-CM

## 2020-05-02 DIAGNOSIS — E1169 Type 2 diabetes mellitus with other specified complication: Secondary | ICD-10-CM | POA: Diagnosis not present

## 2020-05-02 DIAGNOSIS — I480 Paroxysmal atrial fibrillation: Secondary | ICD-10-CM

## 2020-05-02 DIAGNOSIS — IMO0002 Reserved for concepts with insufficient information to code with codable children: Secondary | ICD-10-CM

## 2020-05-02 DIAGNOSIS — E1110 Type 2 diabetes mellitus with ketoacidosis without coma: Secondary | ICD-10-CM

## 2020-05-02 DIAGNOSIS — I10 Essential (primary) hypertension: Secondary | ICD-10-CM | POA: Diagnosis not present

## 2020-05-02 DIAGNOSIS — I82411 Acute embolism and thrombosis of right femoral vein: Secondary | ICD-10-CM

## 2020-05-02 DIAGNOSIS — N838 Other noninflammatory disorders of ovary, fallopian tube and broad ligament: Secondary | ICD-10-CM

## 2020-05-02 DIAGNOSIS — E663 Overweight: Secondary | ICD-10-CM | POA: Diagnosis not present

## 2020-05-02 DIAGNOSIS — Z794 Long term (current) use of insulin: Secondary | ICD-10-CM

## 2020-05-02 DIAGNOSIS — K851 Biliary acute pancreatitis without necrosis or infection: Secondary | ICD-10-CM

## 2020-05-02 DIAGNOSIS — K219 Gastro-esophageal reflux disease without esophagitis: Secondary | ICD-10-CM

## 2020-05-02 DIAGNOSIS — N179 Acute kidney failure, unspecified: Secondary | ICD-10-CM

## 2020-05-02 DIAGNOSIS — E1165 Type 2 diabetes mellitus with hyperglycemia: Secondary | ICD-10-CM

## 2020-05-02 DIAGNOSIS — N183 Chronic kidney disease, stage 3 unspecified: Secondary | ICD-10-CM

## 2020-05-02 DIAGNOSIS — E785 Hyperlipidemia, unspecified: Secondary | ICD-10-CM

## 2020-05-02 DIAGNOSIS — K922 Gastrointestinal hemorrhage, unspecified: Secondary | ICD-10-CM

## 2020-05-02 DIAGNOSIS — D62 Acute posthemorrhagic anemia: Secondary | ICD-10-CM

## 2020-05-02 DIAGNOSIS — E1122 Type 2 diabetes mellitus with diabetic chronic kidney disease: Secondary | ICD-10-CM | POA: Diagnosis not present

## 2020-05-02 DIAGNOSIS — E118 Type 2 diabetes mellitus with unspecified complications: Secondary | ICD-10-CM

## 2020-05-02 DIAGNOSIS — Z931 Gastrostomy status: Secondary | ICD-10-CM

## 2020-05-02 DIAGNOSIS — K861 Other chronic pancreatitis: Secondary | ICD-10-CM

## 2020-05-02 DIAGNOSIS — Z95828 Presence of other vascular implants and grafts: Secondary | ICD-10-CM

## 2020-05-02 DIAGNOSIS — K259 Gastric ulcer, unspecified as acute or chronic, without hemorrhage or perforation: Secondary | ICD-10-CM

## 2020-05-02 NOTE — Patient Outreach (Signed)
[facility]) Care Management  05/02/2020  Katherine Oliver May 31, 1946 [id number]  Referral Received 6/28 Initial Outreach 6/30 Transition of care to be completed by provider  Telephone Assessment-Successful-Enrolled Diabetes  RN spoke with pt today and explained the purpose for today's call and [facility] services. Pt receptive and discussed some of her past medical issues along with her recent discharge. States her supportive spouse assist with administering her insulin and she had her provider visit today. Pt receptive to enrolling into the [facility] program and services. RN further educated pt on available programs based upon her request to learn more about her recent diagnosis of Diabetes. RN offered enroll into this program and will educate pt accordingly on the risk involved if this medical condition is not monitored and management with the provided medications. RN offered to review the pt's medications however pt opt to decline at this time based upon her visit today and review of her discharged orders at the provider's office.  Discussed and generated a plan of care related to diabetes with education on hypo-hyperglycemia and what to do if acute symptoms should occur. Also discussed the importance of adherence with taking all her prescribed medications and attending all scheduled medical appointments to avoid acute encounters. Offered to send education printed material (EMMI) and discussed specifics on where pt lacked the knowledge in managing this condition.   Plan: Will send Successful and Welcome letter along with notifying her providers on her participation in the [facility] program and services. Will follow up next week for the initial assessment and pt has agreed to monthly follow up appointments after that assessment. THN CM Care Plan Problem One     Most Recent Value  Care Plan Problem One Deficient knowledge related to diabetes management and high risk for readmission  Role  Documenting the Problem One Care Management Telephonic Coordinator  Care Plan for Problem One Active  THN Long Term Goal  Pt will verbalize two symptoms and treatments of hypoglycemia within the next 90 days.  THN Long Term Goal Start Date 05/02/20  Interventions for Problem One Long Term Goal WIll educate on diabetes both hypo-hyperglycemia and what to do ifa cute symptoms should occur. Will also send educational printed material that RN will educate on today related to diabetes management.   THN CM Short Term Goal #1  Pt will adhere to all discharge medications post hospital discharged within the next 30 days.  THN CM Short Term Goal #1 Start Date 05/02/20  Interventions for Short Term Goal #1 Will offer to review all medications and verify pt has refills with no issues to address at this time.   THN CM Short Term Goal #2  Pt will adhere to all scheduled medical appointments within the next 30 days.  THN CM Short Term Goal #2 Start Date 05/02/20  Interventions for Short Term Goal #2 Will verify pt has sufficent transportation for all her scheduled appointments and offer additional resources if needed. Will stress ther importance of attending all appointment to avoid  risk of readmissions.      [name], RN Care Management Coordinator [place] Office [phone]

## 2020-05-02 NOTE — Progress Notes (Signed)
This visit was conducted in person.  BP 134/78 (BP Location: Left Arm, Patient Position: Sitting, Cuff Size: Normal)   Pulse 92   Temp 97.8 F (36.6 C) (Temporal)   Ht 5' (1.524 m)   Wt 140 lb (63.5 kg)   SpO2 95%   BMI 27.34 kg/m    CC: rehab/hospital f/u visit  Subjective:    Patient ID: Katherine Oliver, female    DOB: 16-Nov-1945, 74 y.o.   MRN: [id number]  HPI: Katherine Oliver is a 74 y.o. female presenting on 05/02/2020 for Hospitalization Follow-up (Rehab f/u.  Pt accompanied by husband, [name]- temp 97.7.)   Prolonged complicated recent hospitalizations for necrotizing gallstone pancreatitis, seen at [facility] s/p cystogastrostomy placement s/p endoscopic necrostectomy and perc drainage of cyst. Developed gastroduodenal artery bleed s/p embolization. Complicated with DVT started on lovenox. Discharged to [facility] 02/24/2020. Finally came home 04/19/2020. At home started feeling poorly with malaise, nausea, vomiting and dark stools - labs revealed Hgb down to 4.4.   Returned to [facility] ER 04/22/2020 s/p blood transfusions and transfer to [facility]. Found to be in DKA with glu 500, bicarb 16 - treated with IV insulin infusion. Had total 3 units pRBC and IV protonix. CTA didn't find source of bleed. Started on protonix plan 40mg  bid for 1 month then 40mg  once daily. Planned f/u with [facility] (televisit scheduled next week). She also received feraheme x1, planned oral iron and folate replacement. PICC line placed due to poor IV access, since removed.   Jpeg tube present. Only PO food and meds currently. Will ask about removal.   Actually nausea better since tube feeds stopped. Back on regular diet. Denies fevers/chills. Last emesis was 2 wks ago. Occasional epigastric abd discomfort. Ongoing diarrhea.   ?need for tube feed, pancrelipase and reglan. Currently not taking pancreatic enzymes.  Reglan started for prior nausea unresponsive to zofran and phenergan. She was on Q6 hour dosing,  has been trying to slowly cut down. Also takes meclizine PRN between reglan doses.   DM - stable on lantus 12u daily + SSI (4-12 TID AC). She is checking cbg's 4 times a day. Upcoming endocrinology f/u appointment. Discussing CGM and DSME.  Lab Results  Component Value Date   HGBA1C 6.2 (H) 03/06/2020   Afib - decided against anticoagulation at this time.  HTN - only on metoprolol with stable readings. RLE femoral DVT - opted to stop lovenox. IR placed IVC filter.  40 lb weight loss since illness. Now off amlodipine and lisinopril.  Husband wonders if recent illness was autoimmune in nature after 2nd COVID vaccine.    Admit date: 04/22/2020 Discharge date: 04/28/2020 TCM hosp f/u phone call completed 04/30/2020  Admitted From: Home Disposition: Home  Recommendations for Outpatient Follow-up:  1. Follow up with PCP in 1-2 weeks 2. 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 Interim medical history since our last visit reviewed. Allergies and medications reviewed and updated. Outpatient Medications Prior to Visit  Medication Sig Dispense Refill  . ACCU-CHEK AVIVA PLUS test strip USE AS DIRECTED TO CHECK BLOOD SUGARS ONCE DAILY E11.21 (Patient taking differently:  Checks blood sugar 4 times a day) 100 strip 7  . acetaminophen (TYLENOL) 325 MG tablet Take 650 mg by mouth every 6 (six) hours as needed for mild pain.    Marland Kitchen conjugated estrogens (PREMARIN) vaginal cream Place 1 Applicatorful vaginally daily. Only to be applied topically (Patient taking differently: Place 1 Applicatorful vaginally every other day. Only to be applied topically) 42.5 g 12  . ferrous sulfate 325 (65 FE) MG tablet Take 1 tablet (325 mg total) by mouth daily with breakfast. 30 tablet 1  . folic acid (FOLVITE) 1 MG tablet Take 1 tablet (1 mg total) by mouth daily. 30 tablet 1  . insulin lispro (HUMALOG) 100 UNIT/ML injection Inject 4-12 Units into the skin 3 (three) times daily before meals. (according to sliding scale - max 36u daily)    . LANTUS SOLOSTAR 100 UNIT/ML Solostar Pen Inject 12 Units into the skin at bedtime.    Marland Kitchen loperamide (IMODIUM A-D) 2 MG tablet Take 2 mg by mouth 4 (four) times daily as needed for diarrhea or loose stools.    . meclizine (ANTIVERT) 25 MG tablet Take 1 tablet (25 mg total) by mouth 3 (three) times daily as needed for nausea. 30 tablet 0  . metoCLOPramide (REGLAN) 10 MG tablet Take 1 tablet (10 mg total) by mouth every 6 (six) hours. 120 tablet 0  . metoprolol succinate (TOPROL XL) 25 MG 24 hr tablet Take 1 tablet (25 mg total) by mouth daily. 30 tablet 2  . Multiple Vitamin (MULTIVITAMIN) tablet 1 tablet by Per J Tube route daily.     . pantoprazole (PROTONIX) 40 MG tablet Take 1 tablet (40 mg total) by mouth 2 (two) times daily. 60 tablet 0  . [START ON 05/29/2020] pantoprazole (PROTONIX) 40 MG tablet Take 1 tablet (40 mg total) by mouth daily. 30 tablet 1   No facility-administered medications prior to visit.     Per HPI unless specifically indicated in ROS section below Review of Systems Objective:  BP 134/78 (BP Location: Left Arm, Patient Position: Sitting, Cuff Size: Normal)   Pulse 92   Temp 97.8 F (36.6 C) (Temporal)   Ht 5' (1.524 m)    Wt 140 lb (63.5 kg)   SpO2 95%   BMI 27.34 kg/m   Wt Readings from Last 3 Encounters:  05/02/20 140 lb (63.5 kg)  04/28/20 139 lb 9.6 oz (63.3 kg)  04/22/20 138 lb 14.2 oz (63 kg)      Physical Exam Vitals and nursing note reviewed.  Constitutional:      Appearance: Normal appearance. She is not ill-appearing.     Comments: Sitting in wheelchair  Cardiovascular:     Rate and Rhythm: Normal rate and regular rhythm.     Pulses: Normal pulses.     Heart sounds: Normal heart sounds. No murmur heard.   Pulmonary:     Effort: Pulmonary effort is normal. No respiratory distress.     Breath sounds: Normal breath sounds. No wheezing, rhonchi or rales.  Abdominal:     General: Abdomen is flat. Bowel sounds are increased. There is no distension.     Palpations: Abdomen is soft.  There is no mass.     Tenderness: There is no abdominal tenderness.     Comments: J tube in place, dressing c/d/i  Musculoskeletal:     Right lower leg: No edema.     Left lower leg: No edema.  Skin:    General: Skin is warm.     Coloration: Skin is pale.     Findings: No rash.  Neurological:     Mental Status: She is alert.  Psychiatric:        Mood and Affect: Mood normal.        Behavior: Behavior normal.       Lab Results  Component Value Date   CREATININE 0.86 04/28/2020   BUN 13 04/28/2020   NA 135 04/28/2020   K 3.9 04/28/2020   CL 105 04/28/2020   CO2 20 (L) 04/28/2020    Lab Results  Component Value Date   ALT 11 04/26/2020   AST 11 (L) 04/26/2020   ALKPHOS 55 04/26/2020   BILITOT 0.6 04/26/2020    Lab Results  Component Value Date   HGBA1C 6.2 (H) 03/06/2020    Lab Results  Component Value Date   WBC 6.7 04/28/2020   HGB 9.8 (L) 04/28/2020   HCT 29.8 (L) 04/28/2020   MCV 89.2 04/28/2020   PLT 147 (L) 04/28/2020    Assessment & Plan:  This visit occurred during the SARS-CoV-2 public health emergency.  Safety protocols were in place, including screening questions prior to  the visit, additional usage of staff PPE, and extensive cleaning of exam room while observing appropriate contact time as indicated for disinfecting solutions.   Problem List Items Addressed This Visit    Uncontrolled type 2 diabetes mellitus with complication, with long-term current use of insulin ([place])    Now on lantus and SSI insulin - continue. Followed by endocrinology at [facility]. Discussing continuous glucose monitor.       Right femoral vein DVT ([place])    Found 04/2020. Severe bleed on lovenox - decided against anticoagulant, IVC filter in place.      Presence of IVC filter    B[place] filter placed 04/2020.  Potentially retrievable - rec f/u IR 8-12 wks for further eval.       Overweight (BMI 25.0-29.9)    40 lb weight loss over the last 3 months after critical illness.      Ovarian mass, right    Will discuss pelvic ultrasound at f/u visit.       Necrotizing pancreatitis - Primary    Closely followed by GI. J tube in place - to discuss removal with GI next week.       Relevant Orders   CBC with Differential/Platelet   Comprehensive metabolic panel   GERD (gastroesophageal reflux disease)    On PPI BID x93mo, then planned PPI daily.       Gastrostomy in place [facility])    No longer receiving tube feeds. To discuss continued need for J-tube      Gastric stress ulcer    Presumed source of recent GIB - on EGD no ulcers found. Planned PPI BID x 1 month then transition to QD likely longterm.       Dyslipidemia associated with type 2 diabetes mellitus ([place])    Not currently on statin - will need to discuss restarting at f/u appointment.       DKA (diabetic ketoacidoses) ([place])    During hospitalization, now continues lantus 12u daily + SSI TID AC.  Sees endo.       CKD stage 3 due to type 2 diabetes mellitus ([place])    Latest kidney function actually improved.       Chronic pancreatitis ([place])    To discuss need for pancreatic enzyme replacement with GI next week.         Benign hypertension    Stable period only on metoprolol XL 25mg  daily - now off amlodipine and ACEI.      AKI (acute kidney injury) ([place])    Needed CRRT during hospitalization 12/2019. Cr actually significantly improved and seems better than prior baseline.       Relevant Orders   Comprehensive metabolic panel   AF (paroxysmal atrial fibrillation) (HCC)    Newly found - not a good anticoagulation candidate given recent marked anemia from GIB. Will stay off anticoagulation at this time. Sounds regular today. Continue Toprol XL      Acute upper GI bleed   Relevant Orders   CBC with Differential/Platelet   Comprehensive metabolic panel   Acute gallstone pancreatitis    Complicated by necrotizing pancreatitis s/p prolonged hospitalization with stents and drain placed.       Acute blood loss anemia    RTC Friday for rpt labs (CBC)      Relevant Orders   CBC with Differential/Platelet       No orders of the defined types were placed in this encounter.  Orders Placed This Encounter  Procedures  . CBC with Differential/Platelet    Standing Status:   Future    Standing Expiration Date:   05/03/2021  . Comprehensive metabolic panel    Standing Status:   Future    Standing Expiration Date:   05/03/2021    Patient Instructions  Return Friday for lab visit only.  Continue titrating reglan as able. May use meclizine as needed.  Continue current insulin dosing.  Good to see you today - hang in there! Return in 2 months for follow up visit.    Follow up plan: Return in about 2 months (around 07/02/2020) for follow up visit.  [name], MD

## 2020-05-02 NOTE — Patient Instructions (Addendum)
Return Friday for lab visit only.  Continue titrating reglan as able. May use meclizine as needed.  Continue current insulin dosing.  Good to see you today - hang in there! Return in 2 months for follow up visit.

## 2020-05-03 ENCOUNTER — Other Ambulatory Visit: Payer: Self-pay | Admitting: *Deleted

## 2020-05-03 ENCOUNTER — Telehealth: Payer: Self-pay

## 2020-05-03 DIAGNOSIS — K8591 Acute pancreatitis with uninfected necrosis, unspecified: Secondary | ICD-10-CM | POA: Diagnosis not present

## 2020-05-03 DIAGNOSIS — I4891 Unspecified atrial fibrillation: Secondary | ICD-10-CM | POA: Diagnosis not present

## 2020-05-03 DIAGNOSIS — D62 Acute posthemorrhagic anemia: Secondary | ICD-10-CM | POA: Diagnosis not present

## 2020-05-03 DIAGNOSIS — I82411 Acute embolism and thrombosis of right femoral vein: Secondary | ICD-10-CM | POA: Insufficient documentation

## 2020-05-03 DIAGNOSIS — K280 Acute gastrojejunal ulcer with hemorrhage: Secondary | ICD-10-CM | POA: Diagnosis not present

## 2020-05-03 DIAGNOSIS — M6281 Muscle weakness (generalized): Secondary | ICD-10-CM | POA: Diagnosis not present

## 2020-05-03 DIAGNOSIS — K8050 Calculus of bile duct without cholangitis or cholecystitis without obstruction: Secondary | ICD-10-CM | POA: Diagnosis not present

## 2020-05-03 DIAGNOSIS — Z95828 Presence of other vascular implants and grafts: Secondary | ICD-10-CM | POA: Insufficient documentation

## 2020-05-03 DIAGNOSIS — R197 Diarrhea, unspecified: Secondary | ICD-10-CM

## 2020-05-03 HISTORY — PX: ESOPHAGOGASTRODUODENOSCOPY: SHX1529

## 2020-05-03 NOTE — Assessment & Plan Note (Addendum)
Closely followed by GI. J tube in place - to discuss removal with GI next week.

## 2020-05-03 NOTE — Assessment & Plan Note (Signed)
Bard [place] filter placed 04/2020.  Potentially retrievable - rec f/u IR 8-12 wks for further eval.

## 2020-05-03 NOTE — Assessment & Plan Note (Signed)
Will discuss pelvic ultrasound at f/u visit.

## 2020-05-03 NOTE — Assessment & Plan Note (Signed)
On PPI BID x45mo, then planned PPI daily.

## 2020-05-03 NOTE — Assessment & Plan Note (Signed)
Stable period only on metoprolol XL 25mg  daily - now off amlodipine and ACEI.

## 2020-05-03 NOTE — Assessment & Plan Note (Signed)
Presumed source of recent GIB - on EGD no ulcers found. Planned PPI BID x 1 month then transition to QD likely longterm.

## 2020-05-03 NOTE — Assessment & Plan Note (Addendum)
No longer receiving tube feeds. To discuss continued need for J-tube

## 2020-05-03 NOTE — Assessment & Plan Note (Signed)
Needed CRRT during hospitalization 12/2019. Cr actually significantly improved and seems better than prior baseline.

## 2020-05-03 NOTE — Assessment & Plan Note (Signed)
RTC Friday for rpt labs (CBC)

## 2020-05-03 NOTE — Assessment & Plan Note (Signed)
Latest kidney function actually improved.

## 2020-05-03 NOTE — Assessment & Plan Note (Signed)
Found 04/2020. Severe bleed on lovenox - decided against anticoagulant, IVC filter in place.

## 2020-05-03 NOTE — Assessment & Plan Note (Signed)
Now on lantus and SSI insulin - continue. Followed by endocrinology at [facility]. Discussing continuous glucose monitor.

## 2020-05-03 NOTE — Assessment & Plan Note (Signed)
Not currently on statin - will need to discuss restarting at f/u appointment.

## 2020-05-03 NOTE — Telephone Encounter (Addendum)
May give verbal order for above plan.   Would try off reglan to see if diarrhea improves.  Will also need to discuss pancreatic enzyme supplementation with GI as pancreatic insufficiency could present with ongoing diarrhea.  How much imodium is she taking a day?

## 2020-05-03 NOTE — Telephone Encounter (Signed)
Spoke with Lurline Idol informing her Dr. Darnell Level is giving verbal orders for requested services.  I also relayed Dr. Synthia Innocent message.  She verbalizes understanding stating she thinks pt see GI tomorrow.  Also, says pt usually take 1 Imodium and the issue is resolved.  However, today she took 2.  FYI to Dr. Darnell Level.

## 2020-05-03 NOTE — Assessment & Plan Note (Signed)
40 lb weight loss over the last 3 months after critical illness.

## 2020-05-03 NOTE — Assessment & Plan Note (Signed)
To discuss need for pancreatic enzyme replacement with GI next week.

## 2020-05-03 NOTE — Assessment & Plan Note (Signed)
Complicated by necrotizing pancreatitis s/p prolonged hospitalization with stents and drain placed.

## 2020-05-03 NOTE — Telephone Encounter (Signed)
I received a call from [place] with [facility] She wanted ti give FYI to Dr. Darnell Level that they will be seeing pt for medication management  1x a week for 1 week 2x a week for 2 weeks 1x a week for 4 weeks Lurline Idol also states that patient has been complaining of diarrhea - she states she has been taking imodium, but has had no relief and she is wondering what else could be tried? Please advise.

## 2020-05-03 NOTE — Patient Outreach (Signed)
[facility]) Care Management  05/03/2020  Kandie Keiper [facility] 03-16-1946 [id number]   EMMI-GENERAL DISCHARGE-RESOLVED RED ON EMMI ALERT Day #1 Date: 05/02/2020 Red Alert Reason:UNFILLED MEDICATIONS, UNABLE TO FILL AND QUESTIONS  OUTREACH#1 RN spoke with pt today and confirmed the above emmi have been resolved. Pt enrolled into the [facility] program on yesterday. Pt states she has a follow up appointment on Friday with her provider to discuss the only medications not available is the pancreatic enzymes medication. All other medication filled and pt taking accordingly.  Plan: Will closed with no other issues as RN will follow up next week for ongoing disease management services and to complete the initial assessment.  [name], RN Care Management Coordinator [place] Office [phone]

## 2020-05-03 NOTE — Assessment & Plan Note (Signed)
Newly found - not a good anticoagulation candidate given recent marked anemia from GIB. Will stay off anticoagulation at this time. Sounds regular today. Continue Toprol XL

## 2020-05-03 NOTE — Assessment & Plan Note (Signed)
During hospitalization, now continues lantus 12u daily + SSI TID AC. Sees endo.

## 2020-05-04 ENCOUNTER — Other Ambulatory Visit: Payer: Self-pay

## 2020-05-04 ENCOUNTER — Other Ambulatory Visit (INDEPENDENT_AMBULATORY_CARE_PROVIDER_SITE_OTHER): Payer: Medicare Other

## 2020-05-04 DIAGNOSIS — R197 Diarrhea, unspecified: Secondary | ICD-10-CM | POA: Diagnosis not present

## 2020-05-04 DIAGNOSIS — N179 Acute kidney failure, unspecified: Secondary | ICD-10-CM

## 2020-05-04 DIAGNOSIS — K922 Gastrointestinal hemorrhage, unspecified: Secondary | ICD-10-CM

## 2020-05-04 DIAGNOSIS — K8591 Acute pancreatitis with uninfected necrosis, unspecified: Secondary | ICD-10-CM | POA: Diagnosis not present

## 2020-05-04 DIAGNOSIS — D62 Acute posthemorrhagic anemia: Secondary | ICD-10-CM

## 2020-05-04 LAB — CBC WITH DIFFERENTIAL/PLATELET
Basophils Absolute: 0 10*3/uL (ref 0.0–0.1)
Basophils Relative: 0.5 % (ref 0.0–3.0)
Eosinophils Absolute: 0 10*3/uL (ref 0.0–0.7)
Eosinophils Relative: 0 % (ref 0.0–5.0)
HCT: 31.6 % — ABNORMAL LOW (ref 36.0–46.0)
Hemoglobin: 10.6 g/dL — ABNORMAL LOW (ref 12.0–15.0)
Lymphocytes Relative: 9.5 % — ABNORMAL LOW (ref 12.0–46.0)
Lymphs Abs: 0.8 10*3/uL (ref 0.7–4.0)
MCHC: 33.6 g/dL (ref 30.0–36.0)
MCV: 85.6 fl (ref 78.0–100.0)
Monocytes Absolute: 0.5 10*3/uL (ref 0.1–1.0)
Monocytes Relative: 6.2 % (ref 3.0–12.0)
Neutro Abs: 7.2 10*3/uL (ref 1.4–7.7)
Neutrophils Relative %: 83.8 % — ABNORMAL HIGH (ref 43.0–77.0)
Platelets: 208 10*3/uL (ref 150.0–400.0)
RBC: 3.69 Mil/uL — ABNORMAL LOW (ref 3.87–5.11)
RDW: 16.2 % — ABNORMAL HIGH (ref 11.5–15.5)
WBC: 8.6 10*3/uL (ref 4.0–10.5)

## 2020-05-04 LAB — COMPREHENSIVE METABOLIC PANEL
ALT: 4 U/L (ref 0–35)
AST: 9 U/L (ref 0–37)
Albumin: 3.1 g/dL — ABNORMAL LOW (ref 3.5–5.2)
Alkaline Phosphatase: 85 U/L (ref 39–117)
BUN: 15 mg/dL (ref 6–23)
CO2: 23 mEq/L (ref 19–32)
Calcium: 8.5 mg/dL (ref 8.4–10.5)
Chloride: 106 mEq/L (ref 96–112)
Creatinine, Ser: 0.91 mg/dL (ref 0.40–1.20)
GFR: 60.15 mL/min (ref >60.00)
Glucose, Bld: 171 mg/dL — ABNORMAL HIGH (ref 70–99)
Potassium: 4 mEq/L (ref 3.5–5.1)
Sodium: 137 mEq/L (ref 135–145)
Total Bilirubin: 0.4 mg/dL (ref 0.2–1.2)
Total Protein: 5.8 g/dL — ABNORMAL LOW (ref 6.0–8.3)

## 2020-05-04 NOTE — Telephone Encounter (Signed)
Spoke with patient at lab visit.  Will add stool testing for ongoing diarrhea.

## 2020-05-04 NOTE — Addendum Note (Signed)
Addended by: [name] on: 05/04/2020 10:52 AM   Modules accepted: Orders

## 2020-05-04 NOTE — Telephone Encounter (Signed)
Pt called; pt coming in today at 11 AM for labs (oked by [place] and Terri since pt having diarrhea for 3 days. Pt has been taking immodium 2 pills in AM, 1 pill in afternoon and 1 pill at hs. Pt stopped Reglan on her own 05/02/20. Pt still has no relief from soft and loose diarrhea that is not watery. Last 24 hrs pt has had diarrhea x 6. Pt has had 4 of those diarrhea stools this morning. Last time diarrhea was between 8 - 9 AM.  Pt has taken 2 immodiums this morning. Pt is eating and drinking water normally. Pt has had lt upper dull,achy abd pain on and off; when has pain the pain level is 7. No pain or fever now. Pt said was seen on 05/02/20 and does not want to schedule an appt.  Pt request cb and if no call before comes for labs at 11 AM pt is going to ask if answer from Dr Darnell Level. [facility].

## 2020-05-04 NOTE — Addendum Note (Signed)
Addended by: Ellamae Sia on: 05/04/2020 02:20 PM   Modules accepted: Orders

## 2020-05-04 NOTE — Addendum Note (Signed)
Addended by: Ellamae Sia on: 05/04/2020 02:17 PM   Modules accepted: Orders

## 2020-05-08 DIAGNOSIS — K8591 Acute pancreatitis with uninfected necrosis, unspecified: Secondary | ICD-10-CM | POA: Diagnosis not present

## 2020-05-09 ENCOUNTER — Other Ambulatory Visit: Payer: Self-pay | Admitting: *Deleted

## 2020-05-09 DIAGNOSIS — M6281 Muscle weakness (generalized): Secondary | ICD-10-CM | POA: Diagnosis not present

## 2020-05-09 DIAGNOSIS — K280 Acute gastrojejunal ulcer with hemorrhage: Secondary | ICD-10-CM | POA: Diagnosis not present

## 2020-05-09 DIAGNOSIS — I4891 Unspecified atrial fibrillation: Secondary | ICD-10-CM | POA: Diagnosis not present

## 2020-05-09 DIAGNOSIS — D62 Acute posthemorrhagic anemia: Secondary | ICD-10-CM | POA: Diagnosis not present

## 2020-05-09 DIAGNOSIS — K8591 Acute pancreatitis with uninfected necrosis, unspecified: Secondary | ICD-10-CM | POA: Diagnosis not present

## 2020-05-09 DIAGNOSIS — K8050 Calculus of bile duct without cholangitis or cholecystitis without obstruction: Secondary | ICD-10-CM | POA: Diagnosis not present

## 2020-05-09 NOTE — Telephone Encounter (Signed)
Error

## 2020-05-09 NOTE — Patient Outreach (Signed)
[facility]) Care Management  05/09/2020  Uchechi Denison [facility] 05/06/1946 [id number]   Telephone Assessment-Requested a call back  RN spoke with pt today and inquired further on completing the initial assessment. Pt requested a call back tomorrow to complete this task.  PLAN: RN will rescheduled a call back for tomorrow to complete the initial assessment.  [name], RN Care Management Coordinator [place] Office [phone]

## 2020-05-10 ENCOUNTER — Other Ambulatory Visit: Payer: Self-pay | Admitting: *Deleted

## 2020-05-10 ENCOUNTER — Encounter: Payer: Self-pay | Admitting: *Deleted

## 2020-05-10 DIAGNOSIS — K8591 Acute pancreatitis with uninfected necrosis, unspecified: Secondary | ICD-10-CM | POA: Diagnosis not present

## 2020-05-10 DIAGNOSIS — D62 Acute posthemorrhagic anemia: Secondary | ICD-10-CM | POA: Diagnosis not present

## 2020-05-10 DIAGNOSIS — I4891 Unspecified atrial fibrillation: Secondary | ICD-10-CM | POA: Diagnosis not present

## 2020-05-10 DIAGNOSIS — K8050 Calculus of bile duct without cholangitis or cholecystitis without obstruction: Secondary | ICD-10-CM | POA: Diagnosis not present

## 2020-05-10 DIAGNOSIS — K280 Acute gastrojejunal ulcer with hemorrhage: Secondary | ICD-10-CM | POA: Diagnosis not present

## 2020-05-10 DIAGNOSIS — M6281 Muscle weakness (generalized): Secondary | ICD-10-CM | POA: Diagnosis not present

## 2020-05-10 LAB — PANCREATIC ELASTASE, FECAL: Pancreatic Elastase-1, Stool: <15 mcg/g — ABNORMAL LOW

## 2020-05-10 NOTE — Patient Outreach (Signed)
[facility]) Care Management  05/10/2020  Sudie Bandel [facility] Nov 28, 1945 [id number]   Telephone Assessment-Diabetes  RN spoke with pt today and updated the plan of care with the reported goals and interventions. Pt verbalized and understanding and continue to be receptive to all discussed. Pt received the diabetic packet with educational emmi that were discussed today and continue to manage her care with support from her spouse. Completed the initial assessment and will follow up next month with ongoing disease management resources to continue to assist pt in managing her diabetes.   Goals Addressed              This Visit's Progress   .  maintain control of my diabetes (pt-stated)        .CARE PLAN ENTRY (see longtitudinal plan of care for additional care plan information)  Objective:  Lab Results  Component Value Date   HGBA1C 6.2 (H) 03/06/2020 .   Lab Results  Component Value Date   CREATININE 0.91 05/04/2020   CREATININE 0.86 04/28/2020   CREATININE 0.89 04/27/2020 .   Marland Kitchen No results found for: EGFR  Current Barriers:  Marland Kitchen Knowledge Deficits related to basic Diabetes pathophysiology and self care/management . Knowledge Deficits related to medications used for management of diabetes  Case Manager Clinical Goal(s):  Over the next 45 days, patient will demonstrate improved adherence to prescribed treatment plan for diabetes self care/management as evidenced by: healthier eating habits in managing her diabetes . Verbalize daily monitoring and recording of CBG within 30 days . Verbalize adherence to prescribed medication regimen within the next 30 days   Interventions:  . Provided education to patient about basic DM disease process . Reviewed medications with patient and discussed importance of medication adherence . Provided patient with written educational materials related to hypo and hyperglycemia and importance of correct treatment  Patient Self  Care Activities:  . Attends all scheduled provider appointments . Checks blood sugars as prescribed and utilize hyper and hypoglycemia protocol as needed  Initial goal documentation         [name], RN Care Management Coordinator [place] Office [phone]

## 2020-05-11 NOTE — Addendum Note (Signed)
Addended by: [name] on: 05/11/2020 05:21 PM   Modules accepted: Orders

## 2020-05-11 NOTE — Telephone Encounter (Signed)
Spoke with patient.  rec have pharmacy special-order creon She endorses leg swelling - will restart hctz 12.5mg  daily.

## 2020-05-11 NOTE — Telephone Encounter (Signed)
Spoke with pt relaying Dr. Synthia Innocent message.  Pt verbalizes understanding but says the pharmacy is having trouble getting the pancreatic enzyme.  Also, says she did not see GI.

## 2020-05-11 NOTE — Telephone Encounter (Signed)
Patient contacted the office and states she was seen in the office about 2 weeks ago, but still continues to have diarrhea, and mild abdominal cramps. She states that she has been taking imodium as prescribed, but she is not having any relief. She states she is drinking plenty of fluids and eating bland foods, but nothing seems to help, and she is wondering if there can be anything else sent in for her to help with this?

## 2020-05-11 NOTE — Telephone Encounter (Addendum)
Stool testing showing severe pancreatic insufficiency likely where diarrhea is coming from - I think she may need pancreas enzyme replacement (was on pancrelipase). What did GI say about this when seen earlier this week?  Recommend she restart her pancreatic enzyme supplement.

## 2020-05-14 DIAGNOSIS — K280 Acute gastrojejunal ulcer with hemorrhage: Secondary | ICD-10-CM | POA: Diagnosis not present

## 2020-05-14 DIAGNOSIS — K8591 Acute pancreatitis with uninfected necrosis, unspecified: Secondary | ICD-10-CM | POA: Diagnosis not present

## 2020-05-14 DIAGNOSIS — K8050 Calculus of bile duct without cholangitis or cholecystitis without obstruction: Secondary | ICD-10-CM | POA: Diagnosis not present

## 2020-05-14 DIAGNOSIS — M6281 Muscle weakness (generalized): Secondary | ICD-10-CM | POA: Diagnosis not present

## 2020-05-14 DIAGNOSIS — I4891 Unspecified atrial fibrillation: Secondary | ICD-10-CM | POA: Diagnosis not present

## 2020-05-14 DIAGNOSIS — D62 Acute posthemorrhagic anemia: Secondary | ICD-10-CM | POA: Diagnosis not present

## 2020-05-14 NOTE — Telephone Encounter (Signed)
Noted  

## 2020-05-15 DIAGNOSIS — D62 Acute posthemorrhagic anemia: Secondary | ICD-10-CM | POA: Diagnosis not present

## 2020-05-15 DIAGNOSIS — K8591 Acute pancreatitis with uninfected necrosis, unspecified: Secondary | ICD-10-CM | POA: Diagnosis not present

## 2020-05-15 DIAGNOSIS — I4891 Unspecified atrial fibrillation: Secondary | ICD-10-CM | POA: Diagnosis not present

## 2020-05-15 DIAGNOSIS — K280 Acute gastrojejunal ulcer with hemorrhage: Secondary | ICD-10-CM | POA: Diagnosis not present

## 2020-05-15 DIAGNOSIS — K8050 Calculus of bile duct without cholangitis or cholecystitis without obstruction: Secondary | ICD-10-CM | POA: Diagnosis not present

## 2020-05-15 DIAGNOSIS — M6281 Muscle weakness (generalized): Secondary | ICD-10-CM | POA: Diagnosis not present

## 2020-05-16 DIAGNOSIS — M6281 Muscle weakness (generalized): Secondary | ICD-10-CM | POA: Diagnosis not present

## 2020-05-16 DIAGNOSIS — K8050 Calculus of bile duct without cholangitis or cholecystitis without obstruction: Secondary | ICD-10-CM | POA: Diagnosis not present

## 2020-05-16 DIAGNOSIS — K280 Acute gastrojejunal ulcer with hemorrhage: Secondary | ICD-10-CM | POA: Diagnosis not present

## 2020-05-16 DIAGNOSIS — D62 Acute posthemorrhagic anemia: Secondary | ICD-10-CM | POA: Diagnosis not present

## 2020-05-16 DIAGNOSIS — K8591 Acute pancreatitis with uninfected necrosis, unspecified: Secondary | ICD-10-CM | POA: Diagnosis not present

## 2020-05-16 DIAGNOSIS — I4891 Unspecified atrial fibrillation: Secondary | ICD-10-CM | POA: Diagnosis not present

## 2020-05-18 ENCOUNTER — Other Ambulatory Visit: Payer: Self-pay | Admitting: Family Medicine

## 2020-05-18 DIAGNOSIS — K8591 Acute pancreatitis with uninfected necrosis, unspecified: Secondary | ICD-10-CM | POA: Diagnosis not present

## 2020-05-18 DIAGNOSIS — K280 Acute gastrojejunal ulcer with hemorrhage: Secondary | ICD-10-CM | POA: Diagnosis not present

## 2020-05-18 DIAGNOSIS — I4891 Unspecified atrial fibrillation: Secondary | ICD-10-CM | POA: Diagnosis not present

## 2020-05-18 DIAGNOSIS — M6281 Muscle weakness (generalized): Secondary | ICD-10-CM | POA: Diagnosis not present

## 2020-05-18 DIAGNOSIS — D62 Acute posthemorrhagic anemia: Secondary | ICD-10-CM | POA: Diagnosis not present

## 2020-05-18 DIAGNOSIS — K8050 Calculus of bile duct without cholangitis or cholecystitis without obstruction: Secondary | ICD-10-CM | POA: Diagnosis not present

## 2020-05-18 LAB — FECAL FAT, QUALITATIVE: FECAL FAT, QUALITATIVE: ABNORMAL — AB

## 2020-05-18 LAB — GASTROINTESTINAL PATHOGEN PANEL PCR
C. difficile Tox A/B, PCR: DETECTED — AB
Campylobacter, PCR: NOT DETECTED
Cryptosporidium, PCR: NOT DETECTED
E coli (ETEC) LT/ST PCR: NOT DETECTED
E coli (STEC) stx1/stx2, PCR: NOT DETECTED
E coli 0157, PCR: NOT DETECTED
Giardia lamblia, PCR: NOT DETECTED
Norovirus, PCR: NOT DETECTED
Rotavirus A, PCR: NOT DETECTED
Salmonella, PCR: NOT DETECTED
Shigella, PCR: NOT DETECTED

## 2020-05-18 MED ORDER — VANCOMYCIN HCL 125 MG PO CAPS: 125.0000 mg | ORAL_CAPSULE | Freq: Four times a day (QID) | ORAL | 0 refills | Status: DC

## 2020-05-20 DIAGNOSIS — Z7984 Long term (current) use of oral hypoglycemic drugs: Secondary | ICD-10-CM | POA: Diagnosis not present

## 2020-05-20 DIAGNOSIS — Z87440 Personal history of urinary (tract) infections: Secondary | ICD-10-CM | POA: Diagnosis not present

## 2020-05-20 DIAGNOSIS — N1831 Chronic kidney disease, stage 3a: Secondary | ICD-10-CM | POA: Diagnosis not present

## 2020-05-20 DIAGNOSIS — I129 Hypertensive chronic kidney disease with stage 1 through stage 4 chronic kidney disease, or unspecified chronic kidney disease: Secondary | ICD-10-CM | POA: Diagnosis not present

## 2020-05-20 DIAGNOSIS — E1165 Type 2 diabetes mellitus with hyperglycemia: Secondary | ICD-10-CM | POA: Diagnosis not present

## 2020-05-20 DIAGNOSIS — D62 Acute posthemorrhagic anemia: Secondary | ICD-10-CM | POA: Diagnosis not present

## 2020-05-20 DIAGNOSIS — M6281 Muscle weakness (generalized): Secondary | ICD-10-CM | POA: Diagnosis not present

## 2020-05-20 DIAGNOSIS — I480 Paroxysmal atrial fibrillation: Secondary | ICD-10-CM | POA: Diagnosis not present

## 2020-05-20 DIAGNOSIS — Z96651 Presence of right artificial knee joint: Secondary | ICD-10-CM | POA: Diagnosis not present

## 2020-05-20 DIAGNOSIS — K922 Gastrointestinal hemorrhage, unspecified: Secondary | ICD-10-CM | POA: Diagnosis not present

## 2020-05-20 DIAGNOSIS — K861 Other chronic pancreatitis: Secondary | ICD-10-CM | POA: Diagnosis not present

## 2020-05-20 DIAGNOSIS — Z931 Gastrostomy status: Secondary | ICD-10-CM | POA: Diagnosis not present

## 2020-05-20 DIAGNOSIS — E1122 Type 2 diabetes mellitus with diabetic chronic kidney disease: Secondary | ICD-10-CM | POA: Diagnosis not present

## 2020-05-20 DIAGNOSIS — L22 Diaper dermatitis: Secondary | ICD-10-CM | POA: Diagnosis not present

## 2020-05-20 DIAGNOSIS — K7581 Nonalcoholic steatohepatitis (NASH): Secondary | ICD-10-CM | POA: Diagnosis not present

## 2020-05-21 DIAGNOSIS — K922 Gastrointestinal hemorrhage, unspecified: Secondary | ICD-10-CM | POA: Diagnosis not present

## 2020-05-21 DIAGNOSIS — M6281 Muscle weakness (generalized): Secondary | ICD-10-CM | POA: Diagnosis not present

## 2020-05-21 DIAGNOSIS — D62 Acute posthemorrhagic anemia: Secondary | ICD-10-CM | POA: Diagnosis not present

## 2020-05-21 DIAGNOSIS — E1122 Type 2 diabetes mellitus with diabetic chronic kidney disease: Secondary | ICD-10-CM | POA: Diagnosis not present

## 2020-05-21 DIAGNOSIS — I480 Paroxysmal atrial fibrillation: Secondary | ICD-10-CM | POA: Diagnosis not present

## 2020-05-21 DIAGNOSIS — K861 Other chronic pancreatitis: Secondary | ICD-10-CM | POA: Diagnosis not present

## 2020-05-22 ENCOUNTER — Ambulatory Visit: Payer: PRIVATE HEALTH INSURANCE | Admitting: Cardiology

## 2020-05-24 DIAGNOSIS — D62 Acute posthemorrhagic anemia: Secondary | ICD-10-CM | POA: Diagnosis not present

## 2020-05-24 DIAGNOSIS — K861 Other chronic pancreatitis: Secondary | ICD-10-CM | POA: Diagnosis not present

## 2020-05-24 DIAGNOSIS — M6281 Muscle weakness (generalized): Secondary | ICD-10-CM | POA: Diagnosis not present

## 2020-05-24 DIAGNOSIS — I480 Paroxysmal atrial fibrillation: Secondary | ICD-10-CM | POA: Diagnosis not present

## 2020-05-24 DIAGNOSIS — K922 Gastrointestinal hemorrhage, unspecified: Secondary | ICD-10-CM | POA: Diagnosis not present

## 2020-05-24 DIAGNOSIS — E1122 Type 2 diabetes mellitus with diabetic chronic kidney disease: Secondary | ICD-10-CM | POA: Diagnosis not present

## 2020-05-29 ENCOUNTER — Encounter: Payer: Self-pay | Admitting: Internal Medicine

## 2020-05-29 DIAGNOSIS — Z978 Presence of other specified devices: Secondary | ICD-10-CM | POA: Diagnosis not present

## 2020-05-29 DIAGNOSIS — D631 Anemia in chronic kidney disease: Secondary | ICD-10-CM | POA: Diagnosis not present

## 2020-05-29 DIAGNOSIS — E1122 Type 2 diabetes mellitus with diabetic chronic kidney disease: Secondary | ICD-10-CM | POA: Diagnosis not present

## 2020-05-29 DIAGNOSIS — Z79899 Other long term (current) drug therapy: Secondary | ICD-10-CM | POA: Diagnosis not present

## 2020-05-29 DIAGNOSIS — Z794 Long term (current) use of insulin: Secondary | ICD-10-CM | POA: Diagnosis not present

## 2020-05-29 DIAGNOSIS — K760 Fatty (change of) liver, not elsewhere classified: Secondary | ICD-10-CM | POA: Diagnosis not present

## 2020-05-29 DIAGNOSIS — Z86718 Personal history of other venous thrombosis and embolism: Secondary | ICD-10-CM | POA: Diagnosis not present

## 2020-05-29 DIAGNOSIS — I480 Paroxysmal atrial fibrillation: Secondary | ICD-10-CM | POA: Diagnosis not present

## 2020-05-29 DIAGNOSIS — K3189 Other diseases of stomach and duodenum: Secondary | ICD-10-CM | POA: Diagnosis not present

## 2020-05-29 DIAGNOSIS — I10 Essential (primary) hypertension: Secondary | ICD-10-CM | POA: Diagnosis not present

## 2020-05-29 DIAGNOSIS — Z4659 Encounter for fitting and adjustment of other gastrointestinal appliance and device: Secondary | ICD-10-CM | POA: Diagnosis not present

## 2020-05-29 DIAGNOSIS — I129 Hypertensive chronic kidney disease with stage 1 through stage 4 chronic kidney disease, or unspecified chronic kidney disease: Secondary | ICD-10-CM | POA: Diagnosis not present

## 2020-05-29 DIAGNOSIS — N183 Chronic kidney disease, stage 3 unspecified: Secondary | ICD-10-CM | POA: Diagnosis not present

## 2020-05-29 DIAGNOSIS — K219 Gastro-esophageal reflux disease without esophagitis: Secondary | ICD-10-CM | POA: Diagnosis not present

## 2020-05-29 DIAGNOSIS — Z8719 Personal history of other diseases of the digestive system: Secondary | ICD-10-CM | POA: Diagnosis not present

## 2020-05-31 DIAGNOSIS — K922 Gastrointestinal hemorrhage, unspecified: Secondary | ICD-10-CM | POA: Diagnosis not present

## 2020-05-31 DIAGNOSIS — M6281 Muscle weakness (generalized): Secondary | ICD-10-CM | POA: Diagnosis not present

## 2020-05-31 DIAGNOSIS — D62 Acute posthemorrhagic anemia: Secondary | ICD-10-CM | POA: Diagnosis not present

## 2020-05-31 DIAGNOSIS — K861 Other chronic pancreatitis: Secondary | ICD-10-CM | POA: Diagnosis not present

## 2020-05-31 DIAGNOSIS — I480 Paroxysmal atrial fibrillation: Secondary | ICD-10-CM | POA: Diagnosis not present

## 2020-05-31 DIAGNOSIS — E1122 Type 2 diabetes mellitus with diabetic chronic kidney disease: Secondary | ICD-10-CM | POA: Diagnosis not present

## 2020-06-03 ENCOUNTER — Encounter: Payer: Self-pay | Admitting: Family Medicine

## 2020-06-04 ENCOUNTER — Other Ambulatory Visit: Payer: Self-pay

## 2020-06-04 ENCOUNTER — Ambulatory Visit (INDEPENDENT_AMBULATORY_CARE_PROVIDER_SITE_OTHER): Payer: Medicare Other | Admitting: Family Medicine

## 2020-06-04 ENCOUNTER — Encounter: Payer: Self-pay | Admitting: Family Medicine

## 2020-06-04 VITALS — BP 152/88 | HR 79 | Temp 97.5°F | Ht 60.0 in | Wt 137.4 lb

## 2020-06-04 DIAGNOSIS — K8591 Acute pancreatitis with uninfected necrosis, unspecified: Secondary | ICD-10-CM | POA: Diagnosis not present

## 2020-06-04 DIAGNOSIS — R197 Diarrhea, unspecified: Secondary | ICD-10-CM | POA: Diagnosis not present

## 2020-06-04 DIAGNOSIS — L659 Nonscarring hair loss, unspecified: Secondary | ICD-10-CM | POA: Diagnosis not present

## 2020-06-04 DIAGNOSIS — E1122 Type 2 diabetes mellitus with diabetic chronic kidney disease: Secondary | ICD-10-CM

## 2020-06-04 DIAGNOSIS — E119 Type 2 diabetes mellitus without complications: Secondary | ICD-10-CM | POA: Diagnosis not present

## 2020-06-04 DIAGNOSIS — N183 Chronic kidney disease, stage 3 unspecified: Secondary | ICD-10-CM | POA: Diagnosis not present

## 2020-06-04 DIAGNOSIS — Z7189 Other specified counseling: Secondary | ICD-10-CM | POA: Diagnosis not present

## 2020-06-04 DIAGNOSIS — I10 Essential (primary) hypertension: Secondary | ICD-10-CM

## 2020-06-04 DIAGNOSIS — I4891 Unspecified atrial fibrillation: Secondary | ICD-10-CM

## 2020-06-04 DIAGNOSIS — A498 Other bacterial infections of unspecified site: Secondary | ICD-10-CM | POA: Diagnosis not present

## 2020-06-04 DIAGNOSIS — K529 Noninfective gastroenteritis and colitis, unspecified: Secondary | ICD-10-CM | POA: Diagnosis not present

## 2020-06-04 DIAGNOSIS — Z931 Gastrostomy status: Secondary | ICD-10-CM

## 2020-06-04 DIAGNOSIS — Z794 Long term (current) use of insulin: Secondary | ICD-10-CM | POA: Diagnosis not present

## 2020-06-04 DIAGNOSIS — E1165 Type 2 diabetes mellitus with hyperglycemia: Secondary | ICD-10-CM | POA: Diagnosis not present

## 2020-06-04 DIAGNOSIS — Z713 Dietary counseling and surveillance: Secondary | ICD-10-CM | POA: Diagnosis not present

## 2020-06-04 LAB — TSH: TSH: 8 u[IU]/mL — ABNORMAL HIGH (ref 0.35–4.50)

## 2020-06-04 LAB — CBC WITH DIFFERENTIAL/PLATELET
Basophils Absolute: 0 10*3/uL (ref 0.0–0.1)
Basophils Relative: 0.3 % (ref 0.0–3.0)
Eosinophils Absolute: 0 10*3/uL (ref 0.0–0.7)
Eosinophils Relative: 0 % (ref 0.0–5.0)
HCT: 35.2 % — ABNORMAL LOW (ref 36.0–46.0)
Hemoglobin: 11.3 g/dL — ABNORMAL LOW (ref 12.0–15.0)
Lymphocytes Relative: 18.3 % (ref 12.0–46.0)
Lymphs Abs: 1.5 10*3/uL (ref 0.7–4.0)
MCHC: 32.1 g/dL (ref 30.0–36.0)
MCV: 84.1 fl (ref 78.0–100.0)
Monocytes Absolute: 0.7 10*3/uL (ref 0.1–1.0)
Monocytes Relative: 8.7 % (ref 3.0–12.0)
Neutro Abs: 6.1 10*3/uL (ref 1.4–7.7)
Neutrophils Relative %: 72.7 % (ref 43.0–77.0)
Platelets: 197 10*3/uL (ref 150.0–400.0)
RBC: 4.19 Mil/uL (ref 3.87–5.11)
RDW: 16.9 % — ABNORMAL HIGH (ref 11.5–15.5)
WBC: 8.4 10*3/uL (ref 4.0–10.5)

## 2020-06-04 LAB — COMPREHENSIVE METABOLIC PANEL
ALT: 15 U/L (ref 0–35)
AST: 23 U/L (ref 0–37)
Albumin: 3.3 g/dL — ABNORMAL LOW (ref 3.5–5.2)
Alkaline Phosphatase: 118 U/L — ABNORMAL HIGH (ref 39–117)
BUN: 11 mg/dL (ref 6–23)
CO2: 28 mEq/L (ref 19–32)
Calcium: 8.9 mg/dL (ref 8.4–10.5)
Chloride: 105 mEq/L (ref 96–112)
Creatinine, Ser: 0.92 mg/dL (ref 0.40–1.20)
GFR: 59.38 mL/min — ABNORMAL LOW (ref >60.00)
Glucose, Bld: 63 mg/dL — ABNORMAL LOW (ref 70–99)
Potassium: 3.8 mEq/L (ref 3.5–5.1)
Sodium: 141 mEq/L (ref 135–145)
Total Bilirubin: 0.3 mg/dL (ref 0.2–1.2)
Total Protein: 6.1 g/dL (ref 6.0–8.3)

## 2020-06-04 MED ORDER — METOPROLOL SUCCINATE ER 25 MG PO TB24
25.0000 mg | ORAL_TABLET | Freq: Every day | ORAL | 3 refills | Status: DC
Start: 2020-06-04 — End: 2020-07-02

## 2020-06-04 NOTE — Progress Notes (Signed)
This visit was conducted in person.  BP (!) 152/88 (BP Location: Right Arm, Patient Position: Sitting, Cuff Size: Normal)   Pulse 79   Temp (!) 97.5 F (36.4 C) (Temporal)   Ht 5' (1.524 m)   Wt 137 lb 7 oz (62.3 kg)   SpO2 96%   BMI 26.84 kg/m    CC: 1 mo f/u visit  Subjective:    Patient ID: Katherine Oliver, female    DOB: 06-Jan-1946, 74 y.o.   MRN: [id number]  HPI: Katherine Oliver is a 74 y.o. female presenting on 06/04/2020 for Follow-up (Here for EGD f/u.  Pt accompanied by husband, [name]- temp 97.6.)   See prior note for details. Just had EGD last week, reviewed: ESOPHAGOGASTRODUODENOSCOPY 05/2020 - normal esophagus, duodenum, erythematous mucosa in gastric body, gastric cystogastrostomy stent removed ([facility])  Chronic diarrhea - thought related to pancreatic insufficiency as fecal fat positive and fecal elastase was markedly low (<15) but GI pathogen panel testing returned positive for C diff. This would be second time she has had C diff (initially treated while hospitalized at [facility] 02/2020. We started vancomycin 125mg  QID x 10 days. Finished vanc course last week. Hasn't noticed any change in diarrhea. She did get creon and will start this week. Notices hair falling out. Describes loose diarrhea (not watery) 2-6 times a day - light brown. No clay color. Greasy stool (bright orange oil). Taking imodium 2 in am and 1-2 in pm.  She underwent EGD but removal of feeding was postponed due to C diff.   Notes increased blood pressures despite regularly taking metoprolol XL 25mg  daily and hctz 12.5mg  daily. Denies HA, vision changes, CP/tightness, SOB, leg swelling.   Next GI appt is 06/19/2020.  Upcoming urology appt later this month. Has not been taking daily suppressive abx. 1 UTI while in rehab, not otherwise.  Lab Results  Component Value Date   TSH 8.00 (H) 06/04/2020        Relevant past medical, surgical, family and social history reviewed and updated  as indicated. Interim medical history since our last visit reviewed. Allergies and medications reviewed and updated. Outpatient Medications Prior to Visit  Medication Sig Dispense Refill  . ACCU-CHEK AVIVA PLUS test strip USE AS DIRECTED TO CHECK BLOOD SUGARS ONCE DAILY E11.21 (Patient taking differently: Checks blood sugar 4 times a day) 100 strip 7  . acetaminophen (TYLENOL) 325 MG tablet Take 650 mg by mouth every 6 (six) hours as needed for mild pain.    Marland Kitchen conjugated estrogens (PREMARIN) vaginal cream Place 1 Applicatorful vaginally daily. Only to be applied topically (Patient taking differently: Place 1 Applicatorful vaginally every other day. Only to be applied topically) 42.5 g 12  . ferrous sulfate 325 (65 FE) MG tablet Take 1 tablet (325 mg total) by mouth daily with breakfast. 30 tablet 1  . folic acid (FOLVITE) 1 MG tablet Take 1 tablet (1 mg total) by mouth daily. 30 tablet 1  . hydrochlorothiazide (MICROZIDE) 12.5 MG capsule Take 1 capsule (12.5 mg total) by mouth daily.    . insulin lispro (HUMALOG) 100 UNIT/ML injection Inject 4-12 Units into the skin 3 (three) times daily before meals. (according to sliding scale - max 36u daily)    . LANTUS SOLOSTAR 100 UNIT/ML Solostar Pen Inject 12 Units into the skin at bedtime.    Marland Kitchen loperamide (IMODIUM A-D) 2 MG tablet Take 2 mg by mouth 4 (four) times daily as needed for diarrhea or loose stools.    Marland Kitchen  meclizine (ANTIVERT) 25 MG tablet Take 1 tablet (25 mg total) by mouth 3 (three) times daily as needed for nausea. 30 tablet 0  . pantoprazole (PROTONIX) 40 MG tablet Take 1 tablet (40 mg total) by mouth daily. 30 tablet 1  . metoprolol succinate (TOPROL XL) 25 MG 24 hr tablet Take 1 tablet (25 mg total) by mouth daily. 30 tablet 2  . pantoprazole (PROTONIX) 40 MG tablet Take 1 tablet (40 mg total) by mouth 2 (two) times daily. 60 tablet 0  . benazepril (LOTENSIN) 5 MG tablet Take 1 tablet (5 mg total) by mouth daily. 30 tablet 6  . CREON  36000-114000 units CPEP capsule SMARTSIG:Capsule(s) By Mouth As Directed (Patient not taking: Reported on 06/04/2020)    . Multiple Vitamin (MULTIVITAMIN) tablet 1 tablet by Per J Tube route daily.  (Patient not taking: Reported on 05/10/2020)    . metoCLOPramide (REGLAN) 10 MG tablet Take 1 tablet (10 mg total) by mouth every 6 (six) hours. (Patient not taking: Reported on 05/10/2020) 120 tablet 0  . vancomycin (VANCOCIN) 125 MG capsule Take 1 capsule (125 mg total) by mouth 4 (four) times daily. 40 capsule 0   No facility-administered medications prior to visit.     Per HPI unless specifically indicated in ROS section below Review of Systems Objective:  BP (!) 152/88 (BP Location: Right Arm, Patient Position: Sitting, Cuff Size: Normal)   Pulse 79   Temp (!) 97.5 F (36.4 C) (Temporal)   Ht 5' (1.524 m)   Wt 137 lb 7 oz (62.3 kg)   SpO2 96%   BMI 26.84 kg/m   Wt Readings from Last 3 Encounters:  06/04/20 137 lb 7 oz (62.3 kg)  05/02/20 140 lb (63.5 kg)  04/28/20 139 lb 9.6 oz (63.3 kg)      Physical Exam Vitals and nursing note reviewed.  Constitutional:      Appearance: Normal appearance. She is not ill-appearing.  Cardiovascular:     Rate and Rhythm: Normal rate and regular rhythm.     Pulses: Normal pulses.     Heart sounds: Normal heart sounds. No murmur heard.   Pulmonary:     Effort: Pulmonary effort is normal. No respiratory distress.     Breath sounds: Normal breath sounds. No wheezing, rhonchi or rales.  Abdominal:     General: Abdomen is protuberant. Bowel sounds are normal. There is no distension.     Palpations: Abdomen is soft. There is no mass.     Tenderness: There is generalized abdominal tenderness (mild). There is no right CVA tenderness, left CVA tenderness, guarding or rebound.     Hernia: No hernia is present.     Comments: L sided feeding tube with dressings c/d/i  Musculoskeletal:     Right lower leg: No edema.     Left lower leg: No edema.  Skin:     General: Skin is warm and dry.     Coloration: Skin is pale.     Findings: No rash.  Neurological:     Mental Status: She is alert.  Psychiatric:        Mood and Affect: Mood normal.        Behavior: Behavior normal.       Results for orders placed or performed in visit on 06/04/20  Comprehensive metabolic panel  Result Value Ref Range   Sodium 141 135 - 145 mEq/L   Potassium 3.8 3.5 - 5.1 mEq/L   Chloride 105 96 - 112 mEq/L  CO2 28 19 - 32 mEq/L   Glucose, Bld 63 (L) 70 - 99 mg/dL   BUN 11 6 - 23 mg/dL   Creatinine, Ser 0.92 0.40 - 1.20 mg/dL   Total Bilirubin 0.3 0.2 - 1.2 mg/dL   Alkaline Phosphatase 118 (H) 39 - 117 U/L   AST 23 0 - 37 U/L   ALT 15 0 - 35 U/L   Total Protein 6.1 6.0 - 8.3 g/dL   Albumin 3.3 (L) 3.5 - 5.2 g/dL   GFR 59.38 (L) >60.00 mL/min   Calcium 8.9 8.4 - 10.5 mg/dL  TSH  Result Value Ref Range   TSH 8.00 (H) 0.35 - 4.50 uIU/mL  CBC with Differential/Platelet  Result Value Ref Range   WBC 8.4 4.0 - 10.5 K/uL   RBC 4.19 3.87 - 5.11 Mil/uL   Hemoglobin 11.3 (L) 12.0 - 15.0 g/dL   HCT 35.2 (L) 36 - 46 %   MCV 84.1 78.0 - 100.0 fl   MCHC 32.1 30.0 - 36.0 g/dL   RDW 16.9 (H) 11.5 - 15.5 %   Platelets 197.0 150 - 400 K/uL   Neutrophils Relative % 72.7 43 - 77 %   Lymphocytes Relative 18.3 12 - 46 %   Monocytes Relative 8.7 3 - 12 %   Eosinophils Relative 0.0 0 - 5 %   Basophils Relative 0.3 0 - 3 %   Neutro Abs 6.1 1.4 - 7.7 K/uL   Lymphs Abs 1.5 0.7 - 4.0 K/uL   Monocytes Absolute 0.7 0 - 1 K/uL   Eosinophils Absolute 0.0 0 - 0 K/uL   Basophils Absolute 0.0 0 - 0 K/uL   Lab Results  Component Value Date   HGBA1C 6.2 (H) 03/06/2020    Assessment & Plan:  This visit occurred during the SARS-CoV-2 public health emergency.  Safety protocols were in place, including screening questions prior to the visit, additional usage of staff PPE, and extensive cleaning of exam room while observing appropriate contact time as indicated for disinfecting  solutions.   Problem List Items Addressed This Visit    Necrotizing pancreatitis    Just had cystogastrostomy stent removed. Still has feeding tube in place - planned removal later this month. Planning to start creon.       Relevant Medications   CREON 36000-114000 units CPEP capsule   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HCC)    No recurrence noted.       Relevant Medications   benazepril (LOTENSIN) 5 MG tablet   metoprolol succinate (TOPROL XL) 25 MG 24 hr tablet   Hair loss    ?telogen effluvium.  rec start biotin. Check labs including TSH      Gastrostomy in place [facility])   Clostridioides difficile infection    Initial infection treated while hospitalized ~02/2020  Again tested positive 05/2020, treated with 10d vanc course. See above re plan for diarrhea.       CKD stage 3 due to type 2 diabetes mellitus ([place])    Update Cr.       Relevant Medications   benazepril (LOTENSIN) 5 MG tablet   Chronic diarrhea - Primary    For 3+ months now.  Tested positive for C diff, has completed 10d oral vanc treatment without significant benefit besides improvement in watery diarrhea. Describes largely loose greasy stools. This is likely now more related to pancreatic insufficiency than c diff infection. I recommended starting creon for 2 wks and reassess effect on diarrhea. If no benefit, would consider starting  pulse-tapered vanc regimen       Benign hypertension    Deteriorated control noted - will start benazepril 5mg  daily (she has 10mg  at home she can cut in half)      Relevant Medications   benazepril (LOTENSIN) 5 MG tablet   metoprolol succinate (TOPROL XL) 25 MG 24 hr tablet       Meds ordered this encounter  Medications  . metoprolol succinate (TOPROL XL) 25 MG 24 hr tablet    Sig: Take 1 tablet (25 mg total) by mouth daily.    Dispense:  90 tablet    Refill:  3   Orders Placed This Encounter  Procedures  . Comprehensive metabolic panel  . TSH  . CBC with  Differential/Platelet    Patient Instructions  Consider biotin vitamin. Labs today.  Update me in 2 weeks on effect of stool.  Start benazepril 5mg  daily and watch effect on blood pressures - update [place] with readings in 2 weeks as well.  Return in 1 month for follow up visit.   Follow up plan: Return in about 4 weeks (around 07/02/2020) for follow up visit.  [name], MD

## 2020-06-04 NOTE — Patient Instructions (Addendum)
Consider biotin vitamin. Labs today.  Update me in 2 weeks on effect of stool.  Start benazepril 5mg  daily and watch effect on blood pressures - update [place] with readings in 2 weeks as well.  Return in 1 month for follow up visit.

## 2020-06-05 ENCOUNTER — Encounter: Payer: Self-pay | Admitting: Family Medicine

## 2020-06-05 ENCOUNTER — Other Ambulatory Visit (INDEPENDENT_AMBULATORY_CARE_PROVIDER_SITE_OTHER): Payer: Medicare Other

## 2020-06-05 DIAGNOSIS — L659 Nonscarring hair loss, unspecified: Secondary | ICD-10-CM | POA: Insufficient documentation

## 2020-06-05 DIAGNOSIS — R7989 Other specified abnormal findings of blood chemistry: Secondary | ICD-10-CM | POA: Diagnosis not present

## 2020-06-05 DIAGNOSIS — A498 Other bacterial infections of unspecified site: Secondary | ICD-10-CM | POA: Insufficient documentation

## 2020-06-05 LAB — T4, FREE: Free T4: 0.86 ng/dL (ref 0.60–1.60)

## 2020-06-05 NOTE — Assessment & Plan Note (Signed)
Update Cr.  

## 2020-06-05 NOTE — Assessment & Plan Note (Signed)
No recurrence noted.

## 2020-06-05 NOTE — Assessment & Plan Note (Addendum)
For 3+ months now.  Tested positive for C diff, has completed 10d oral vanc treatment without significant benefit besides improvement in watery diarrhea. Describes largely loose greasy stools. This is likely now more related to pancreatic insufficiency than c diff infection. I recommended starting creon for 2 wks and reassess effect on diarrhea. If no benefit, would consider starting pulse-tapered vanc regimen

## 2020-06-05 NOTE — Assessment & Plan Note (Signed)
Initial infection treated while hospitalized ~02/2020  Again tested positive 05/2020, treated with 10d vanc course. See above re plan for diarrhea.

## 2020-06-05 NOTE — Assessment & Plan Note (Signed)
?  telogen effluvium.  rec start biotin. Check labs including TSH

## 2020-06-05 NOTE — Assessment & Plan Note (Signed)
Deteriorated control noted - will start benazepril 5mg  daily (she has 10mg  at home she can cut in half)

## 2020-06-05 NOTE — Assessment & Plan Note (Addendum)
Just had cystogastrostomy stent removed. Still has feeding tube in place - planned removal later this month. Planning to start creon.

## 2020-06-07 DIAGNOSIS — K922 Gastrointestinal hemorrhage, unspecified: Secondary | ICD-10-CM | POA: Diagnosis not present

## 2020-06-07 DIAGNOSIS — D62 Acute posthemorrhagic anemia: Secondary | ICD-10-CM | POA: Diagnosis not present

## 2020-06-07 DIAGNOSIS — E1122 Type 2 diabetes mellitus with diabetic chronic kidney disease: Secondary | ICD-10-CM | POA: Diagnosis not present

## 2020-06-07 DIAGNOSIS — K861 Other chronic pancreatitis: Secondary | ICD-10-CM | POA: Diagnosis not present

## 2020-06-07 DIAGNOSIS — M6281 Muscle weakness (generalized): Secondary | ICD-10-CM | POA: Diagnosis not present

## 2020-06-07 DIAGNOSIS — I480 Paroxysmal atrial fibrillation: Secondary | ICD-10-CM | POA: Diagnosis not present

## 2020-06-12 ENCOUNTER — Other Ambulatory Visit: Payer: Self-pay | Admitting: *Deleted

## 2020-06-12 DIAGNOSIS — D62 Acute posthemorrhagic anemia: Secondary | ICD-10-CM | POA: Diagnosis not present

## 2020-06-12 DIAGNOSIS — M6281 Muscle weakness (generalized): Secondary | ICD-10-CM | POA: Diagnosis not present

## 2020-06-12 DIAGNOSIS — E1122 Type 2 diabetes mellitus with diabetic chronic kidney disease: Secondary | ICD-10-CM | POA: Diagnosis not present

## 2020-06-12 DIAGNOSIS — K861 Other chronic pancreatitis: Secondary | ICD-10-CM | POA: Diagnosis not present

## 2020-06-12 DIAGNOSIS — K922 Gastrointestinal hemorrhage, unspecified: Secondary | ICD-10-CM | POA: Diagnosis not present

## 2020-06-12 DIAGNOSIS — I480 Paroxysmal atrial fibrillation: Secondary | ICD-10-CM | POA: Diagnosis not present

## 2020-06-12 NOTE — Patient Outreach (Signed)
[facility]) Care Management  06/12/2020  Soren Pigman [facility] 03-25-1946 [id number]   Telephone Assessment-Diabetes  RN spoke with pt today and received an update on her ongoing management of care. Pt states she consulted a dietitian through her endocrinologist at [facility] and learned a lot about carbohydrate counting and a specific diet to continue to assist pt with managing her diabetes. Pt is able to read labels and continue to consume high protein and low carbohydrate food items. Reports her AM glucose red ws 173 however reduced later in the day. States she hs an appointment with her provider at the end of this month for an updated A1c.   Reports staring new medication Creon that he is able to tolerate at this time. States her diarrhea has reduce and her hair loss should start to low down now that she is tolerating and holding to more nutrients assisting with her digestive system.   Plan of care discussed and verified as pt continue to manage her ongoing diabetes using the tools and information provided.  Plan: Will follow up next month for ongoing management of care.  Goals Addressed              This Visit's Progress   .  maintain control of my diabetes (pt-stated)   On track     .CARE PLAN ENTRY (see longtitudinal plan of care for additional care plan information)  Objective:  Lab Results  Component Value Date   HGBA1C 6.2 (H) 03/06/2020 .   Lab Results  Component Value Date   CREATININE 0.91 05/04/2020   CREATININE 0.86 04/28/2020   CREATININE 0.89 04/27/2020 .   Marland Kitchen No results found for: EGFR  Current Barriers:  Marland Kitchen Knowledge Deficits related to basic Diabetes pathophysiology and self care/management . Knowledge Deficits related to medications used for management of diabetes  Case Manager Clinical Goal(s):  Over the next 45 days, patient will demonstrate improved adherence to prescribed treatment plan for diabetes self care/management as evidenced  by: healthier eating habits in managing her diabetes . Verbalize daily monitoring and recording of CBG within 30 days . Verbalize adherence to prescribed medication regimen within the next 30 days   Interventions:  . Provided education to patient about basic DM disease process . Reviewed medications with patient and discussed importance of medication adherence . Provided patient with written educational materials related to hypo and hyperglycemia and importance of correct treatment  Patient Self Care Activities:  . Attends all scheduled provider appointments . Checks blood sugars as prescribed and utilize hyper and hypoglycemia protocol as needed  Initial goal documentation        [name], RN Care Management Coordinator [place] Office [phone]

## 2020-06-13 ENCOUNTER — Other Ambulatory Visit: Payer: Self-pay

## 2020-06-13 ENCOUNTER — Other Ambulatory Visit: Payer: Self-pay | Admitting: Diagnostic Radiology

## 2020-06-13 ENCOUNTER — Encounter: Payer: Self-pay | Admitting: Urology

## 2020-06-13 ENCOUNTER — Ambulatory Visit (INDEPENDENT_AMBULATORY_CARE_PROVIDER_SITE_OTHER): Payer: Medicare Other | Admitting: Urology

## 2020-06-13 VITALS — BP 178/108 | HR 111 | Ht 61.0 in | Wt 133.0 lb

## 2020-06-13 DIAGNOSIS — N390 Urinary tract infection, site not specified: Secondary | ICD-10-CM

## 2020-06-13 DIAGNOSIS — R300 Dysuria: Secondary | ICD-10-CM | POA: Diagnosis not present

## 2020-06-13 DIAGNOSIS — Z95828 Presence of other vascular implants and grafts: Secondary | ICD-10-CM

## 2020-06-13 DIAGNOSIS — Z87440 Personal history of urinary (tract) infections: Secondary | ICD-10-CM

## 2020-06-13 MED ORDER — NITROFURANTOIN MONOHYD MACRO 100 MG PO CAPS: 100.0000 mg | ORAL_CAPSULE | Freq: Two times a day (BID) | ORAL | 0 refills | Status: DC

## 2020-06-13 MED ORDER — NITROFURANTOIN MACROCRYSTAL 50 MG PO CAPS
50.0000 mg | ORAL_CAPSULE | Freq: Every day | ORAL | 5 refills | Status: DC
Start: 2020-06-13 — End: 2020-06-20

## 2020-06-13 NOTE — Progress Notes (Signed)
   06/13/2020 11:29 AM   Katherine Oliver 1946/06/29 [id number]  Reason for visit: Follow up recurrent UTIs  HPI: I saw [name] in urology clinic for follow-up of recurrent UTIs.  She is a 74 year old female who I saw last in October 2020 for recurrent Enterococcus UTIs.  We had started cranberry tablet prophylaxis, estrogen cream, and discussed behavioral strategies at length.  She had been doing relatively well until feeling like she had a UTI in February 2021 and was seen by our PA and started on nitrofurantoin and urine culture did ultimately grow Enterococcus.  She then had a very complicated 6 months where she was hospitalized numerous times for complicated necrotizing pancreatitis.  She felt like when she was in rehab they were not always compliant with the estrogen cream or cranberry tablets.  She does feel like she has a UTI today with some dysuria, and urinalysis is concerning for infection with greater than 30 WBCs, 3-10 RBCs, many bacteria, nitrite positive.  Will send for culture.  We discussed the evaluation and treatment of patients with recurrent UTIs at length.  We specifically discussed the differences between asymptomatic bacteriuria and true urinary tract infection.  We discussed the AUA definition of recurrent UTI of at least 2 culture proven symptomatic acute cystitis episodes in a 1-month period, or 3 within a 1 year period.  We discussed the importance of culture directed antibiotic treatment, and antibiotic stewardship.  First-line therapy includes nitrofurantoin(5 days), Bactrim(3 days), or fosfomycin(3 g single dose).  Possible etiologies of recurrent infection include periurethral tissue atrophy in postmenopausal woman, constipation, sexual activity, incomplete emptying, anatomic abnormalities, and even genetic predisposition.  Finally, we discussed the role of perineal hygiene, timed voiding, adequate hydration, topical vaginal estrogen, cranberry prophylaxis,  and low-dose antibiotic prophylaxis.  Urine sent for culture, Macrobid twice daily x7 days for acute infection Continue cranberry tablets and estrogen cream for UTI prevention Add Macrobid 50 mg daily prophylaxis x6 months in setting of recurrent UTIs RTC 6 months symptom check   Billey Co, MD  [facility] [address] [place], [place] [address] [phone]

## 2020-06-14 LAB — URINALYSIS, COMPLETE
Bilirubin, UA: NEGATIVE
Glucose, UA: NEGATIVE
Ketones, UA: NEGATIVE
Nitrite, UA: POSITIVE — AB
Specific Gravity, UA: 1.02 (ref 1.005–1.030)
Urobilinogen, Ur: 0.2 mg/dL (ref 0.2–1.0)
pH, UA: 5.5 (ref 5.0–7.5)

## 2020-06-14 LAB — MICROSCOPIC EXAMINATION: WBC, UA: >30 /hpf — AB (ref 0–5)

## 2020-06-19 DIAGNOSIS — Z9889 Other specified postprocedural states: Secondary | ICD-10-CM | POA: Diagnosis not present

## 2020-06-19 DIAGNOSIS — Z48815 Encounter for surgical aftercare following surgery on the digestive system: Secondary | ICD-10-CM | POA: Diagnosis not present

## 2020-06-19 DIAGNOSIS — Z4659 Encounter for fitting and adjustment of other gastrointestinal appliance and device: Secondary | ICD-10-CM | POA: Diagnosis not present

## 2020-06-19 DIAGNOSIS — K8591 Acute pancreatitis with uninfected necrosis, unspecified: Secondary | ICD-10-CM | POA: Diagnosis not present

## 2020-06-19 LAB — CULTURE, URINE COMPREHENSIVE

## 2020-06-20 ENCOUNTER — Telehealth: Payer: Self-pay

## 2020-06-20 DIAGNOSIS — A498 Other bacterial infections of unspecified site: Secondary | ICD-10-CM

## 2020-06-20 DIAGNOSIS — N390 Urinary tract infection, site not specified: Secondary | ICD-10-CM

## 2020-06-20 MED ORDER — CEPHALEXIN 250 MG PO CAPS: 250.0000 mg | ORAL_CAPSULE | Freq: Every day | ORAL | 6 refills | Status: DC

## 2020-06-20 MED ORDER — CIPROFLOXACIN HCL 500 MG PO TABS: 500.0000 mg | ORAL_TABLET | Freq: Two times a day (BID) | ORAL | 0 refills | Status: AC

## 2020-06-20 NOTE — Telephone Encounter (Signed)
Called pt informed her of the information below. Pt gave verbal understanding. Medications sent in to pharmacy. Macrodantin removed from medication list.

## 2020-06-20 NOTE — Telephone Encounter (Signed)
-----   Message from Billey Co, MD sent at 06/20/2020  8:17 AM EDT ----- Regarding: resistaNT UTI Please change her abx to cipro 500mg  BID x 5 days, then should do keflex 250mg  prophylaxis daily x 6 months not the macrobid, thanks   Nickolas Madrid, MD 06/20/2020  ----- Message ----- From: Interface, [facility] Lab Results In Sent: 06/14/2020  11:37 AM EDT To: Billey Co, MD

## 2020-06-26 ENCOUNTER — Telehealth: Payer: Self-pay

## 2020-06-26 NOTE — Telephone Encounter (Signed)
Pt calls and left message on triage line stating that she began Keflex yesterday evening and since then has noticed a rash on her cheeks. Pt questions if she should be changed to a different medication. Please advise.

## 2020-06-27 MED ORDER — TRIMETHOPRIM 100 MG PO TABS: 100.0000 mg | ORAL_TABLET | Freq: Every day | ORAL | 6 refills | Status: DC

## 2020-06-27 NOTE — Telephone Encounter (Signed)
Patient notified script sent. Patient states her rash is better after taking benadryl

## 2020-06-27 NOTE — Telephone Encounter (Signed)
Lets try trimethoprim 100mg  daily prophylaxis, this will  not interfer with her sulfa allergy  Nickolas Madrid, MD 06/27/2020

## 2020-07-02 ENCOUNTER — Ambulatory Visit (INDEPENDENT_AMBULATORY_CARE_PROVIDER_SITE_OTHER): Payer: Medicare Other | Admitting: Family Medicine

## 2020-07-02 ENCOUNTER — Encounter: Payer: Self-pay | Admitting: Family Medicine

## 2020-07-02 ENCOUNTER — Other Ambulatory Visit: Payer: Self-pay

## 2020-07-02 VITALS — BP 128/84 | HR 82 | Temp 97.9°F | Ht 61.0 in | Wt 138.1 lb

## 2020-07-02 DIAGNOSIS — L659 Nonscarring hair loss, unspecified: Secondary | ICD-10-CM

## 2020-07-02 DIAGNOSIS — I10 Essential (primary) hypertension: Secondary | ICD-10-CM | POA: Diagnosis not present

## 2020-07-02 DIAGNOSIS — E118 Type 2 diabetes mellitus with unspecified complications: Secondary | ICD-10-CM

## 2020-07-02 DIAGNOSIS — K529 Noninfective gastroenteritis and colitis, unspecified: Secondary | ICD-10-CM | POA: Diagnosis not present

## 2020-07-02 DIAGNOSIS — E440 Moderate protein-calorie malnutrition: Secondary | ICD-10-CM | POA: Diagnosis not present

## 2020-07-02 DIAGNOSIS — K861 Other chronic pancreatitis: Secondary | ICD-10-CM | POA: Diagnosis not present

## 2020-07-02 DIAGNOSIS — N838 Other noninflammatory disorders of ovary, fallopian tube and broad ligament: Secondary | ICD-10-CM

## 2020-07-02 DIAGNOSIS — Z95828 Presence of other vascular implants and grafts: Secondary | ICD-10-CM

## 2020-07-02 DIAGNOSIS — Z794 Long term (current) use of insulin: Secondary | ICD-10-CM

## 2020-07-02 DIAGNOSIS — IMO0002 Reserved for concepts with insufficient information to code with codable children: Secondary | ICD-10-CM

## 2020-07-02 DIAGNOSIS — Z87440 Personal history of urinary (tract) infections: Secondary | ICD-10-CM

## 2020-07-02 DIAGNOSIS — A498 Other bacterial infections of unspecified site: Secondary | ICD-10-CM

## 2020-07-02 DIAGNOSIS — E46 Unspecified protein-calorie malnutrition: Secondary | ICD-10-CM | POA: Insufficient documentation

## 2020-07-02 DIAGNOSIS — K8591 Acute pancreatitis with uninfected necrosis, unspecified: Secondary | ICD-10-CM

## 2020-07-02 DIAGNOSIS — N281 Cyst of kidney, acquired: Secondary | ICD-10-CM

## 2020-07-02 DIAGNOSIS — K219 Gastro-esophageal reflux disease without esophagitis: Secondary | ICD-10-CM

## 2020-07-02 DIAGNOSIS — E1165 Type 2 diabetes mellitus with hyperglycemia: Secondary | ICD-10-CM

## 2020-07-02 MED ORDER — PANTOPRAZOLE SODIUM 40 MG PO TBEC: 40.0000 mg | DELAYED_RELEASE_TABLET | Freq: Every day | ORAL | 3 refills | Status: DC

## 2020-07-02 MED ORDER — FERROUS SULFATE 325 (65 FE) MG PO TABS: 325.0000 mg | ORAL_TABLET | Freq: Every day | ORAL | 3 refills | Status: DC

## 2020-07-02 MED ORDER — FOLIC ACID 1 MG PO TABS: 1.0000 mg | ORAL_TABLET | Freq: Every day | ORAL | 3 refills | Status: DC

## 2020-07-02 MED ORDER — BENAZEPRIL HCL 10 MG PO TABS: 10.0000 mg | ORAL_TABLET | Freq: Every day | ORAL | 3 refills | Status: DC

## 2020-07-02 MED ORDER — METOPROLOL SUCCINATE ER 25 MG PO TB24: 25.0000 mg | ORAL_TABLET | Freq: Every day | ORAL | 3 refills | Status: DC

## 2020-07-02 NOTE — Progress Notes (Signed)
This visit was conducted in person.  BP 128/84 (BP Location: Left Arm, Patient Position: Sitting, Cuff Size: Normal)   Pulse 82   Temp 97.9 F (36.6 C) (Temporal)   Ht 5\' 1"  (1.549 m)   Wt 138 lb 1 oz (62.6 kg)   SpO2 97%   BMI 26.09 kg/m    CC: 1 mo f/u visit  Subjective:    Patient ID: Katherine Oliver, female    DOB: 04/11/1946, 74 y.o.   MRN: [id number]  HPI: Katherine Oliver is a 74 y.o. female presenting on 07/02/2020 for Follow-up (Here for 2 mo f/u.  Pt accompanied by husband, [name]- temp 97.9.)   See prior note for details.  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  Chronic diarrhea since - presumed related to pancreatic insufficiency given positive fecal fat and markedly low fecal elastase (<15) however GI pathogen panel returned positive for C diff as well - treated with 10d vancomycin course last month. Course completed about 5 wks ago. Ongoing diarrhea managing with imodium 1-2 BID. Has restarted Creon - diarrhea seems to have improved. Now formed stools, 1-2 times a day.   Upcoming appt with endo on Wednesday - they have been managing diabetes and pancreatic insufficiency.   Had EGD with cystogastrostomy stent removed, then recently had feeding tube removed. Appetite ok.   HTN - benazepril recently restarted at 5mg  daily - she had to increase to 10mg  dose to get better BP control. Continues toprol XL 25mg  daily and hctz 12.5mg  daily.   Saw urology [facility]) - macrobid has been restarted for 6 months.      Relevant past medical, surgical, family and social history reviewed and updated as indicated. Interim medical history since our last visit reviewed. Allergies and medications reviewed and updated. Outpatient Medications Prior to Visit  Medication Sig Dispense Refill  . ACCU-CHEK AVIVA PLUS test strip USE AS DIRECTED TO CHECK BLOOD SUGARS ONCE DAILY E11.21 100 strip 7  . acetaminophen (TYLENOL) 325 MG tablet Take 650  mg by mouth every 6 (six) hours as needed for mild pain.    Marland Kitchen conjugated estrogens (PREMARIN) vaginal cream Place 1 Applicatorful vaginally daily. Only to be applied topically 42.5 g 12  . CREON 36000-114000 units CPEP capsule SMARTSIG:Capsule(s) By Mouth As Directed    . hydrochlorothiazide (MICROZIDE) 12.5 MG capsule Take 1 capsule (12.5 mg total) by mouth daily.    . insulin lispro (HUMALOG) 100 UNIT/ML injection Inject 4-12 Units into the skin 3 (three) times daily before meals. (according to sliding scale - max 36u daily)    . LANTUS SOLOSTAR 100 UNIT/ML Solostar Pen Inject 12 Units into the skin at bedtime.    Marland Kitchen loperamide (IMODIUM A-D) 2 MG tablet Take 2 mg by mouth 4 (four) times daily as needed for diarrhea or loose stools.    . meclizine (ANTIVERT) 25 MG tablet Take 1 tablet (25 mg total) by mouth 3 (three) times daily as needed for nausea. 30 tablet 0  . Multiple Vitamin (MULTIVITAMIN) tablet 1 tablet by Per J Tube route daily.     Marland Kitchen trimethoprim (TRIMPEX) 100 MG tablet Take 1 tablet (100 mg total) by mouth daily. 30 tablet 6  . benazepril (LOTENSIN) 20 MG tablet Take 20 mg by mouth daily. Takes 1/2 tablet daily    . ferrous sulfate 325 (65 FE) MG tablet Take 1 tablet (325 mg total) by mouth daily with breakfast. 30 tablet 1  . folic acid (FOLVITE) 1 MG tablet Take  1 tablet (1 mg total) by mouth daily. 30 tablet 1  . metoprolol succinate (TOPROL XL) 25 MG 24 hr tablet Take 1 tablet (25 mg total) by mouth daily. 90 tablet 3  . pantoprazole (PROTONIX) 40 MG tablet Take 1 tablet (40 mg total) by mouth daily. 30 tablet 1  . benazepril (LOTENSIN) 5 MG tablet Take 5 mg by mouth daily. Takes 10 mg daily (1/2 tablet of 20 mg tablet) (Patient not taking: Reported on 07/02/2020) 30 tablet 6  . cephALEXin (KEFLEX) 250 MG capsule Take 1 capsule (250 mg total) by mouth daily. This replaces nitrofurantoin (Macrobid) 30 capsule 6  . nitrofurantoin, macrocrystal-monohydrate, (MACROBID) 100 MG capsule Take  1 capsule (100 mg total) by mouth every 12 (twelve) hours. 14 capsule 0   No facility-administered medications prior to visit.     Per HPI unless specifically indicated in ROS section below Review of Systems Objective:  BP 128/84 (BP Location: Left Arm, Patient Position: Sitting, Cuff Size: Normal)   Pulse 82   Temp 97.9 F (36.6 C) (Temporal)   Ht 5\' 1"  (1.549 m)   Wt 138 lb 1 oz (62.6 kg)   SpO2 97%   BMI 26.09 kg/m   Wt Readings from Last 3 Encounters:  07/02/20 138 lb 1 oz (62.6 kg)  06/13/20 133 lb (60.3 kg)  06/04/20 137 lb 7 oz (62.3 kg)      Physical Exam Vitals and nursing note reviewed.  Constitutional:      Appearance: Normal appearance. She is not ill-appearing.  Eyes:     Extraocular Movements: Extraocular movements intact.     Pupils: Pupils are equal, round, and reactive to light.  Cardiovascular:     Rate and Rhythm: Normal rate and regular rhythm.     Pulses: Normal pulses.     Heart sounds: Normal heart sounds. No murmur heard.   Pulmonary:     Effort: Pulmonary effort is normal. No respiratory distress.     Breath sounds: Normal breath sounds. No wheezing, rhonchi or rales.  Abdominal:     General: Abdomen is flat. Bowel sounds are normal. There is distension.     Palpations: Abdomen is soft. There is no mass.     Tenderness: There is no abdominal tenderness. There is no right CVA tenderness, left CVA tenderness, guarding or rebound.     Hernia: No hernia is present.     Comments: Feeding tube incision healing well, dressing changed - c/d/i  Musculoskeletal:     Right lower leg: No edema.     Left lower leg: No edema.  Neurological:     Mental Status: She is alert.  Psychiatric:        Mood and Affect: Mood normal.        Behavior: Behavior normal.       Lab Results  Component Value Date   CREATININE 0.92 06/04/2020   BUN 11 06/04/2020   NA 141 06/04/2020   K 3.8 06/04/2020   CL 105 06/04/2020   CO2 28 06/04/2020    Assessment & Plan:   This visit occurred during the SARS-CoV-2 public health emergency.  Safety protocols were in place, including screening questions prior to the visit, additional usage of staff PPE, and extensive cleaning of exam room while observing appropriate contact time as indicated for disinfecting solutions.   Problem List Items Addressed This Visit    Uncontrolled type 2 diabetes mellitus with complication, with long-term current use of insulin ([place])    Appreciate  [facility] endo care - upcoming appt on Wednesday.       Relevant Medications   benazepril (LOTENSIN) 10 MG tablet   Protein-calorie malnutrition ([facility])    Improving readings, weight gain noted, benefiting from Creon       Presence of IVC filter    Upcoming IR appointment.       Ovarian mass, right    Discussed with patient - will proceed with pelvic US to further evaluate this.       Relevant Orders   US PELVIC COMPLETE WITH TRANSVAGINAL   Necrotizing pancreatitis - Primary    Feeding tube recently removed.  Now on creon.  Overall improving period.       Relevant Medications   pantoprazole (PROTONIX) 40 MG tablet   History of recurrent UTIs    Now on trimethoprim preventatively. Appreciate uro care.       Hair loss    Malnutrition related - working on nutrition with improving prealbumin and protein levels per latest labs at [facility]      GERD (gastroesophageal reflux disease)    Continues PPI daily.       Relevant Medications   pantoprazole (PROTONIX) 40 MG tablet   Clostridioides difficile infection   Chronic pancreatitis ([place])   Relevant Medications   pantoprazole (PROTONIX) 40 MG tablet   Chronic diarrhea    S/p treatment for C diff 02/2020, and again 05/2020 (10d oral vanc course).  Started Creon with significant improvement noted in stool frequency and consistency.       Benign hypertension    Chronic, improved control with increase in benazepril to 10mg  daily. Continue this with other regimen.       Relevant  Medications   metoprolol succinate (TOPROL XL) 25 MG 24 hr tablet   benazepril (LOTENSIN) 10 MG tablet   Acquired renal cyst of left kidney    May be enlarging on [facility] imaging.  Discussed with patient - will proceed with renal US to further evaluate this.       Relevant Orders   US Renal       Meds ordered this encounter  Medications  . folic acid (FOLVITE) 1 MG tablet    Sig: Take 1 tablet (1 mg total) by mouth daily.    Dispense:  90 tablet    Refill:  3  . ferrous sulfate 325 (65 FE) MG tablet    Sig: Take 1 tablet (325 mg total) by mouth daily with breakfast.    Dispense:  90 tablet    Refill:  3  . pantoprazole (PROTONIX) 40 MG tablet    Sig: Take 1 tablet (40 mg total) by mouth daily.    Dispense:  90 tablet    Refill:  3  . metoprolol succinate (TOPROL XL) 25 MG 24 hr tablet    Sig: Take 1 tablet (25 mg total) by mouth daily.    Dispense:  90 tablet    Refill:  3  . benazepril (LOTENSIN) 10 MG tablet    Sig: Take 1 tablet (10 mg total) by mouth daily.    Dispense:  90 tablet    Refill:  3    Note new sig   Orders Placed This Encounter  Procedures  . US Renal    Standing Status:   Future    Standing Expiration Date:   07/02/2021    Scheduling Instructions:     Would try to schedule on same day as pelvic US    Order Specific Question:  Reason for Exam (SYMPTOM  OR DIAGNOSIS REQUIRED)    Answer:   L renal lesion 1-2 cm size    Order Specific Question:   Preferred imaging location?    Answer:   [facility]  . US PELVIC COMPLETE WITH TRANSVAGINAL    Standing Status:   Future    Standing Expiration Date:   07/02/2021    Scheduling Instructions:     Would try to schedule on same day as renal US    Order Specific Question:   Reason for Exam (SYMPTOM  OR DIAGNOSIS REQUIRED)    Answer:   f/u possible R ovarian mass    Order Specific Question:   Preferred imaging location?    Answer:   [facility]    Patient Instructions  You are doing well !  Glad to see improvement all around.  Return as needed or in 2-3 months for wellness visit.    Follow up plan: Return in about 3 months (around 10/02/2020) for follow up visit, medicare wellness visit.  [name], MD

## 2020-07-02 NOTE — Assessment & Plan Note (Signed)
Improving readings, weight gain noted, benefiting from Creon

## 2020-07-02 NOTE — Assessment & Plan Note (Signed)
Appreciate [facility] endo care - upcoming appt on Wednesday.

## 2020-07-02 NOTE — Patient Instructions (Signed)
You are doing well ! Glad to see improvement all around.  Return as needed or in 2-3 months for wellness visit.

## 2020-07-02 NOTE — Assessment & Plan Note (Signed)
Continues PPI daily.

## 2020-07-02 NOTE — Assessment & Plan Note (Addendum)
Feeding tube recently removed.  Now on creon.  Overall improving period.

## 2020-07-02 NOTE — Assessment & Plan Note (Signed)
Chronic, improved control with increase in benazepril to 10mg  daily. Continue this with other regimen.

## 2020-07-02 NOTE — Assessment & Plan Note (Signed)
Discussed with patient - will proceed with pelvic US to further evaluate this.

## 2020-07-02 NOTE — Assessment & Plan Note (Signed)
Upcoming IR appointment.

## 2020-07-02 NOTE — Assessment & Plan Note (Signed)
S/p treatment for C diff 02/2020, and again 05/2020 (10d oral vanc course).  Started Creon with significant improvement noted in stool frequency and consistency.

## 2020-07-02 NOTE — Assessment & Plan Note (Signed)
May be enlarging on [facility] imaging.  Discussed with patient - will proceed with renal US to further evaluate this.

## 2020-07-02 NOTE — Assessment & Plan Note (Addendum)
Malnutrition related - working on nutrition with improving prealbumin and protein levels per latest labs at [facility]

## 2020-07-02 NOTE — Assessment & Plan Note (Signed)
Now on trimethoprim preventatively. Appreciate uro care.

## 2020-07-04 DIAGNOSIS — K8689 Other specified diseases of pancreas: Secondary | ICD-10-CM | POA: Diagnosis not present

## 2020-07-04 DIAGNOSIS — K863 Pseudocyst of pancreas: Secondary | ICD-10-CM | POA: Diagnosis not present

## 2020-07-04 DIAGNOSIS — Z794 Long term (current) use of insulin: Secondary | ICD-10-CM | POA: Diagnosis not present

## 2020-07-04 DIAGNOSIS — E089 Diabetes mellitus due to underlying condition without complications: Secondary | ICD-10-CM | POA: Diagnosis not present

## 2020-07-04 DIAGNOSIS — E139 Other specified diabetes mellitus without complications: Secondary | ICD-10-CM | POA: Diagnosis not present

## 2020-07-04 DIAGNOSIS — Z79899 Other long term (current) drug therapy: Secondary | ICD-10-CM | POA: Diagnosis not present

## 2020-07-04 DIAGNOSIS — S36209S Unspecified injury of unspecified part of pancreas, sequela: Secondary | ICD-10-CM | POA: Diagnosis not present

## 2020-07-04 DIAGNOSIS — I10 Essential (primary) hypertension: Secondary | ICD-10-CM | POA: Diagnosis not present

## 2020-07-04 DIAGNOSIS — E119 Type 2 diabetes mellitus without complications: Secondary | ICD-10-CM | POA: Diagnosis not present

## 2020-07-11 ENCOUNTER — Other Ambulatory Visit: Payer: Self-pay | Admitting: Family Medicine

## 2020-07-11 ENCOUNTER — Other Ambulatory Visit: Payer: Self-pay

## 2020-07-11 ENCOUNTER — Ambulatory Visit
Admission: RE | Admit: 2020-07-11 | Discharge: 2020-07-11 | Disposition: A | Discharge status: Home / Self Care | Payer: Medicare Other | Source: Ambulatory Visit | Attending: Family Medicine | Admitting: Family Medicine

## 2020-07-11 DIAGNOSIS — N281 Cyst of kidney, acquired: Secondary | ICD-10-CM

## 2020-07-11 DIAGNOSIS — N838 Other noninflammatory disorders of ovary, fallopian tube and broad ligament: Secondary | ICD-10-CM | POA: Diagnosis not present

## 2020-07-11 DIAGNOSIS — N289 Disorder of kidney and ureter, unspecified: Secondary | ICD-10-CM | POA: Diagnosis not present

## 2020-07-11 DIAGNOSIS — N83291 Other ovarian cyst, right side: Secondary | ICD-10-CM | POA: Diagnosis not present

## 2020-07-13 ENCOUNTER — Other Ambulatory Visit: Payer: Self-pay | Admitting: *Deleted

## 2020-07-13 NOTE — Patient Outreach (Signed)
Katherine Oliver) Care Management  07/13/2020  Katherine Oliver [facility] July 25, 1946 [id number]  Telephone Assessment-Successful-Diabetes  RN spoke with pt today with update on her ongoing management of care. Reports recent visit to [name] where her insulin dosages have changed as noted on medication review update today. Pt reports due to her recent glucose readings her regular insulin changed from 4 to 6 units and the Lantus from 12 to 14 units. States her glucose readings in the AM have ranged from 136, 187 and 197 this week. States her system has started to be more receptive to the Creon therapy (started last week) and this has caused her glucose levels to increase because she is retaining more nutritients from her food items. RN continue to review the plan of care and educated on her on dietary habits and again foods high protein and low carbhydrates. Discussed prior A1c in My 6.2 and the most recent Sept increased to 7.0. Pt aware and understands the importance this level to improve her overall management of care related to her diabetes. Verified [name] has ended services.    Plan pt will continue to work toward the goals discussed with the available interventions and tools provided. RN will follow up next month on pt's progress toward managing her diabetes.   Goals Addressed            This Visit's Progress   . [facility] update 07/13/2020   On track    .CARE PLAN ENTRY (see longtitudinal plan of care for additional care plan information)  Objective:  Lab Results  Component Value Date   HGBA1C 6.2 (H) 03/06/2020 .   Lab Results  Component Value Date   CREATININE 0.91 05/04/2020   CREATININE 0.86 04/28/2020   CREATININE 0.89 04/27/2020 .   Marland Kitchen No results found for: EGFR  Current Barriers:  Marland Kitchen Knowledge Deficits related to basic Diabetes pathophysiology and self care/management . Knowledge Deficits related to medications used for management of diabetes  Case Manager  Clinical Goal(s):  Over the next 45 days, patient will demonstrate improved adherence to prescribed treatment plan for diabetes self care/management as evidenced by: healthier eating habits in managing her diabetes . Verbalize daily monitoring and recording of CBG within 30 days . Verbalize adherence to prescribed medication regimen within the next 30 days   Interventions:  . Provided education to patient about basic DM disease process . Reviewed medications with patient and discussed importance of medication adherence . Provided patient with written educational materials related to hypo and hyperglycemia and importance of correct treatment  Patient Self Care Activities:  . Attends all scheduled provider appointments . Checks blood sugars as prescribed and utilize hyper and hypoglycemia protocol as needed  Initial goal documentation        Katherine Mina, RN Care Management Coordinator [place] Office [phone]

## 2020-07-23 ENCOUNTER — Other Ambulatory Visit: Payer: Self-pay | Admitting: Family Medicine

## 2020-07-23 DIAGNOSIS — Z1231 Encounter for screening mammogram for malignant neoplasm of breast: Secondary | ICD-10-CM

## 2020-07-31 ENCOUNTER — Ambulatory Visit
Admission: RE | Admit: 2020-07-31 | Discharge: 2020-07-31 | Disposition: A | Discharge status: Home / Self Care | Payer: Medicare Other | Source: Ambulatory Visit | Attending: Diagnostic Radiology | Admitting: Diagnostic Radiology

## 2020-07-31 ENCOUNTER — Other Ambulatory Visit: Payer: Self-pay | Admitting: Radiology

## 2020-07-31 ENCOUNTER — Encounter: Payer: Self-pay | Admitting: *Deleted

## 2020-07-31 DIAGNOSIS — Z95828 Presence of other vascular implants and grafts: Secondary | ICD-10-CM

## 2020-07-31 DIAGNOSIS — Z978 Presence of other specified devices: Secondary | ICD-10-CM | POA: Diagnosis not present

## 2020-07-31 DIAGNOSIS — K8591 Acute pancreatitis with uninfected necrosis, unspecified: Secondary | ICD-10-CM

## 2020-07-31 DIAGNOSIS — Z86718 Personal history of other venous thrombosis and embolism: Secondary | ICD-10-CM | POA: Diagnosis not present

## 2020-07-31 HISTORY — PX: IR RADIOLOGIST EVAL & MGMT: IMG5224

## 2020-07-31 NOTE — Progress Notes (Signed)
Chief Complaint: DVT with anemia. IR placed an inferior vena cava filter on 6.24.21 by Dr. Marzetta Merino   Supervising Physician: Markus Daft  History of Present Illness: Katherine Oliver is a 74 y.o. female Complicated medical history including DM, a fib, HTN, necrotizing pancreatitis. While admitted at [facility] in March 2021  the patient was found to have gastroduodenal artery bleed that required embolization and started on Lovenox for right leg DVT. After discharge  In June 2021 a home health nurse noted that the patient was pale and reporting nausea, vomiting and dark stools. Follow up with her PMD showed a hgb of 4. Patient was admitted through the ED at [facility] and then transferred to [facility] with anemia of unknown source likely secondary to GI bleed. EGD performed on 6.22.21 showed no clear source of bleeding. Due to patient's known DVT and inability to be anticoagulated IR placed a BARD Denali IVC filter on 6.24.21 by Dr. Marzetta Merino. Patient seen in Interventional Radiology clinic for further evaluation of possible removal of IVC filter.   Patient reporting an overall improvement in condition and expressed concern regarding the status of her gastroduodenal artery bleed that was found in March 2021.  Past Medical History:  Diagnosis Date  . Allergic rhinitis   . Arthritis   . 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 nephropathy ([place])    DSME at  [facility] 01/2016   . DKA (diabetic ketoacidoses) ([place]) 04/22/2020  . Frequent epistaxis 05/16/2019   S/p cauterization with resolution 2020  . GERD (gastroesophageal reflux disease)   . Hepatic steatosis    by abd [place] 05/2012, mild transaminitis - normal iron sat and viral hep panel (2011), stable [place] 2017  . History of chicken pox   . History of measles   . History of recurrent UTIs    on chronic keflex  . HLD  (hyperlipidemia)   . HTN (hypertension)   . 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 Kidney cyst, acquired 01/2016   L kidney by [place]  . Kidney stone 01/2016   L kidney by [place]  . Lung nodules 11/2013   overall stable on f/u CT 01/2016  . Osteopenia 06/2013   mild, forearm T -1.1, hip and spine WNL  . Pancreatitis   . Polycythemia    mild, stable (2013)  . Primary localized osteoarthritis of right knee 01/05/2019  . Rosacea    metrogel    Past Surgical History:  Procedure Laterality Date  . APPENDECTOMY  1987  . BILIARY STENT PLACEMENT  12/14/2019   Procedure: BILIARY STENT PLACEMENT;  Surgeon: [name], MD;  Location: [place];  Service: Endoscopy;;  . BREAST BIOPSY Right 1963   benign  . BREAST BIOPSY Right 08/2014   benign- core  . cardiolite stress test  04/2004   normal  . CESAREAN SECTION  [id number]   x2  . CHOLECYSTECTOMY  2003  . COLONOSCOPY  09/26/2008   adenomatous polyp, rpt 3-5 yrs  . COLONOSCOPY  08/2012   adenomatous polyps, diverticulosis, rec rpt 5 yrs [name])  . COLONOSCOPY WITH PROPOFOL N/A 02/05/2018   4TA, SSA, diverticulosis, rpt 3 yrs [name], [name], MD)  . dexa  2003   normal  . dexa  06/2013   [facility] - Tscore -1.1 forearm, normal spine and femur  .  ERCP N/A 12/14/2019   Procedure: ENDOSCOPIC RETROGRADE CHOLANGIOPANCREATOGRAPHY (ERCP);  Surgeon: [name], MD;  Location: [place];  Service: Endoscopy;  Laterality: N/A;  . ERCP  01/2020   nonbleeding gastric ulcer - [facility] hospitalization (Dr Mont Dutton)  . ESOPHAGOGASTRODUODENOSCOPY N/A 12/19/2019   Procedure: ESOPHAGOGASTRODUODENOSCOPY (EGD);  Surgeon: [name], MD;  Location: [facility] ENDOSCOPY;  Service: Endoscopy;  Laterality: N/A;  . ESOPHAGOGASTRODUODENOSCOPY  01/2020   pre existing AXIOS cystogastrostomy stent s/p necrosectomy - [facility] hospitalization (Dr Mont Dutton)  . ESOPHAGOGASTRODUODENOSCOPY N/A 04/24/2020   Procedure: ESOPHAGOGASTRODUODENOSCOPY (EGD);   Surgeon: Wonda Horner, MD;  Location: [facility] ENDOSCOPY;  Service: Endoscopy;  Laterality: N/A;  . ESOPHAGOGASTRODUODENOSCOPY  05/2020   normal esophagus, duodenum, erythematous mucosa in gastric body, gastric cystogastrostomy stent removed ([facility])  . IR FLUORO GUIDE CV LINE RIGHT  12/27/2019  . IR FLUORO GUIDE CV LINE RIGHT  04/24/2020  . IR IVC FILTER PLMT / S&I /IMG GUID/MOD SED  04/26/2020  . IR REMOVAL TUN CV CATH W/O FL  01/03/2020  . IR REMOVAL TUN CV CATH W/O FL  04/29/2020  . IR REPLC DUODEN/JEJUNO TUBE PERCUT W/FLUORO  04/01/2020  . IR US GUIDE VASC ACCESS RIGHT  04/24/2020  . SPHINCTEROTOMY  12/14/2019   Procedure: SPHINCTEROTOMY;  Surgeon: [name], MD;  Location: [place];  Service: Endoscopy;;  . 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 TRANSTHORACIC ECHOCARDIOGRAM  04/2019   EF 55-60%, modLVH, impaired relaxation   . TRIGGER FINGER RELEASE  2007;2010;2011   bilateral  . TRIGGER FINGER RELEASE  02/2017  . VAGINAL HYSTERECTOMY  1984   for menorrhagia, ovaries in place    Allergies: Azithromycin, Nickel, Sulfa antibiotics, Vinegar [acetic acid], Adhesive [tape], and Keflex [cephalexin]  Medications: Prior to Admission medications   Medication Sig Start Date End Date Taking? Authorizing Provider  ACCU-CHEK AVIVA PLUS test strip USE AS DIRECTED TO CHECK BLOOD SUGARS ONCE DAILY E11.21 04/17/20   [name], MD  acetaminophen (TYLENOL) 325 MG tablet Take 650 mg by mouth every 6 (six) hours as needed for mild pain.    [provider]  benazepril (LOTENSIN) 10 MG tablet Take 1 tablet (10 mg total) by mouth daily. 07/02/20   [name], MD  conjugated estrogens (PREMARIN) vaginal cream Place 1 Applicatorful vaginally daily. Only to be applied topically 03/14/20   Ezekiel Slocumb, DO  CREON [phone] units CPEP capsule  SMARTSIG:Capsule(s) By Mouth As Directed 05/25/20   [provider]  ferrous sulfate 325 (65 FE) MG tablet Take 1 tablet (325 mg total) by mouth daily with breakfast. 07/02/20   [name], MD  folic acid (FOLVITE) 1 MG tablet Take 1 tablet (1 mg total) by mouth daily. 07/02/20   [name], MD  hydrochlorothiazide (MICROZIDE) 12.5 MG capsule Take 1 capsule (12.5 mg total) by mouth daily. 05/11/20   [name], MD  insulin lispro (HUMALOG) 100 UNIT/ML injection Inject 4-12 Units into the skin 3 (three) times daily before meals. (according to sliding scale - max 36u daily)    [provider]  LANTUS SOLOSTAR 100 UNIT/ML Solostar Pen Inject 14 Units into the skin at bedtime.  04/17/20   [provider]  loperamide (IMODIUM A-D) 2 MG tablet Take 2 mg by mouth 4 (four) times daily as needed for diarrhea or loose stools.    [provider]  meclizine (ANTIVERT) 25 MG tablet  Take 1 tablet (25 mg total) by mouth 3 (three) times daily as needed for nausea. 03/13/20   Nicole Kindred A, DO  metoprolol succinate (TOPROL XL) 25 MG 24 hr tablet Take 1 tablet (25 mg total) by mouth daily. 07/02/20 07/02/21  [name], MD  Multiple Vitamin (MULTIVITAMIN) tablet 1 tablet by Per J Tube route daily.     [provider]  pantoprazole (PROTONIX) 40 MG tablet Take 1 tablet (40 mg total) by mouth daily. 07/02/20   [name], MD  trimethoprim (TRIMPEX) 100 MG tablet Take 1 tablet (100 mg total) by mouth daily. 06/27/20   Billey Co, MD     Family History  Problem Relation Age of Onset  . Stroke Mother        several  . Hyperlipidemia Mother   . Hypertension Mother   . Cancer Father        colon  . Hypertension Father   . Hyperlipidemia Father   . Coronary artery disease Father 55       MIx1, CABG  . Cancer Paternal Aunt        abdominal  . Coronary artery disease Maternal Grandmother   . Diabetes Maternal Grandfather   . Coronary  artery disease Maternal Grandfather   . Breast cancer Neg Hx     Social History   Socioeconomic History  . Marital status: Married    Spouse name: Not on file  . Number of children: Not on file  . Years of education: Not on file  . Highest education level: Not on file  Occupational History  . Not on file  Tobacco Use  . Smoking status: Never Smoker  . Smokeless tobacco: Never Used  Vaping Use  . Vaping Use: Never used  Substance and Sexual Activity  . Alcohol use: Not Currently    Alcohol/week: 1.0 - 2.0 standard drink    Types: 1 - 2 Glasses of wine per week    Comment: Occasional/1 mixed drink per month  . Drug use: No  . Sexual activity: Yes  Other Topics Concern  . 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 Daily walking about 20 min.    Diet: ok water intake 4 glasses/day, daily fruits/vegetables, red meat 4x/wk, fish 3-4x/wk   Social Determinants of Health   Financial Resource Strain: Low Risk   . Difficulty of Paying Living Expenses: Not hard at all  Food Insecurity: No Food Insecurity  . Worried About Charity fundraiser in the Last Year: Never true  . Ran Out of Food in the Last Year: Never true  Transportation Needs: No Transportation Needs  . Lack of Transportation (Medical): No  . Lack of Transportation (Non-Medical): No  Physical Activity: Inactive  . Days of Exercise per Week: 0 days  . Minutes of Exercise per Session: 0 min  Stress: No Stress Concern Present  . Feeling of Stress : Not at all  Social Connections:   . Frequency of Communication with Friends and Family: Not on file  . Frequency of Social Gatherings with Friends and Family: Not on file  . Attends Religious Services: Not on file  . Active Member of Clubs or  Organizations: Not on file  . Attends Club  or Organization Meetings: Not on file  . Marital Status: Not on file    Review of Systems: A 12 point ROS discussed and pertinent positives are indicated in the HPI above.  All other systems are negative.  Review of Systems  Constitutional: Negative for fatigue and fever.  HENT: Negative for congestion.   Respiratory: Negative for cough and shortness of breath.   Gastrointestinal: Negative for abdominal pain, diarrhea, nausea and vomiting.    Vital Signs: There were no vitals taken for this visit.  Physical Exam Vitals and nursing note reviewed.  Constitutional:      Appearance: She is well-developed.  HENT:     Head: Normocephalic and atraumatic.  Eyes:     Conjunctiva/sclera: Conjunctivae normal.  Pulmonary:     Effort: Pulmonary effort is normal.  Musculoskeletal:        General: Normal range of motion.     Cervical back: Normal range of motion.  Skin:    General: Skin is warm.  Neurological:     Mental Status: She is alert and oriented to person, place, and time.     Imaging: US PELVIS (TRANSABDOMINAL ONLY)  Result Date: 07/11/2020 CLINICAL DATA:  RIGHT ovarian mass, follow-up EXAM: TRANSABDOMINAL ULTRASOUND OF PELVIS TECHNIQUE: Transabdominal ultrasound examination of the pelvis was performed including evaluation of the uterus, ovaries, adnexal regions, and pelvic cul-de-sac. Exam limited to transabdominal per patient. COMPARISON:  CT abdomen and pelvis 04/24/2020, 01/11/2020 FINDINGS: Uterus Surgically absent Endometrium N/A Right ovary Not visualized, question surgically absent versus obscured by bowel Left ovary Not visualized question surgically absent versus obscured by bowel Other findings: No free pelvic fluid. No adnexal masses. Specifically, cystic lesion of RIGHT ovary seen on a prior CT exam of 01/11/2020 is not identified, nor was it seen on the interval CT of 04/24/2020. IMPRESSION: Post hysterectomy with  nonvisualization of ovaries. No pelvic mass is visualized. Electronically Signed   By: Lavonia Dana M.D.   On: 07/11/2020 16:30   US Renal  Result Date: 07/11/2020 CLINICAL DATA:  LEFT renal lesion EXAM: RENAL / URINARY TRACT ULTRASOUND COMPLETE COMPARISON:  Renal ultrasound 12/15/2019, CT angio abdomen and pelvis 04/24/2020 FINDINGS: Right Kidney: Renal measurements: 10.2 x 5.8 x 4.3 cm = volume: 132 mL. Normal cortical thickness. Minimally increased cortical echogenicity. No mass, hydronephrosis, or shadowing calcification. Left Kidney: Renal measurements: 12.7 x 5.2 x 5.4 cm = volume: 186 ML. Normal cortical thickness. Increased cortical echogenicity. Small simple cyst at medial aspect of LEFT kidney 1.4 x 1.2 x 1.1 cm. Additional exophytic cystic lesion from mid to lower LEFT kidney 2.0 x 1.9 x 1.6 cm, corresponding to a high attenuation lesion seen on CT consistent with a hyperdense cyst. No additional mass or hydronephrosis. No shadowing calcification. Bladder: Appears normal for degree of bladder distention. BILATERAL ureteral jets visualized. Other: N/A IMPRESSION: Medical renal disease changes of both kidneys. Small LEFT renal cysts. Remainder of exam unremarkable. Electronically Signed   By: Lavonia Dana M.D.   On: 07/11/2020 16:35    Labs:  CBC: Recent Labs    04/28/20 0500 04/28/20 1229 05/04/20 1052 06/04/20 0848  WBC 6.3 6.7 8.6 8.4  HGB 9.2* 9.8* 10.6* 11.3*  HCT 28.7* 29.8* 31.6* 35.2*  PLT 123* 147* 208.0 197.0    COAGS: Recent Labs    03/06/20 1700 04/22/20 1141 04/25/20 1530  INR 1.0 1.2 1.2  APTT  --  27 29    BMP: Recent Labs    04/25/20 0502 04/25/20 0502 04/26/20  2774 04/26/20 0446 04/27/20 0600 04/28/20 0500 05/04/20 1052 06/04/20 0848  NA 140   < > 137   < > 135 135 137 141  K 3.2*   < > 4.1   < > 4.0 3.9 4.0 3.8  CL 113*   < > 111   < > 107 105 106 105  CO2 20*   < > 20*   < > 21* 20* 23 28  GLUCOSE 132*   < > 138*   < > 120* 138* 171* 63*  BUN  10   < > 12   < > 11 13 15 11   CALCIUM 8.3*   < > 8.3*   < > 8.4* 8.5* 8.5 8.9  CREATININE 0.73   < > 0.85   < > 0.89 0.86 0.91 0.92  GFRNONAA >60  --  >60  --  >60 >60  --   --   GFRAA >60  --  >60  --  >60 >60  --   --    < > = values in this interval not displayed.    LIVER FUNCTION TESTS: Recent Labs    04/22/20 1141 04/26/20 0446 05/04/20 1052 06/04/20 0848  BILITOT 1.1 0.6 0.4 0.3  AST 11* 11* 9 23  ALT 10 11 4 15   ALKPHOS 71 55 85 118*  PROT 5.9* 4.4* 5.8* 6.1  ALBUMIN 3.2* 2.2* 3.1* 3.3*    Assessment and Plan:  74 y.o female. Complicated medical history including DM, a fib, HTN, necrotizing pancreatitis. While admitted at [facility] in March 2021  the patient was found to have gastroduodenal artery bleed that required embolization and started on Lovenox for right leg DVT. After discharge  In June 2021 a home health nurse noted that the patient was pale and reporting nausea, vomiting and dark stools. Follow up with her PMD showed a hgb of 4. Patient was admitted through the ED at [facility] and then transferred to [facility] with anemia of unknown source likely secondary to GI bleed. EGD performed on 6.22.21 showed no clear source of bleeding. Due to patient's knows DVT and inability to be anticoagulated IR placed a BARD Denali IVC filter on 6.24.21.  Venous doppler performed on 9.28.21 shows no acute DVT.  Patient to follow up with PMD in November. IVF filter to remain in place at this time. Patient will be seen for follow up in Interventional Radiology clinic in 6 months time for further evaluation.  Thank you for this interesting consult.  I greatly enjoyed meeting Katherine Oliver and look forward to participating in their care.  A copy of this report was sent to the requesting provider on this date.  Electronically Signed: Jacqualine Mau, NP 07/31/2020, 2:20 PM   I spent a total of   15 Minutes in face to face in clinical consultation, greater than 50% of which was  counseling/coordinating care for possible IVC filter removal.

## 2020-08-13 ENCOUNTER — Other Ambulatory Visit: Payer: Self-pay | Admitting: *Deleted

## 2020-08-13 NOTE — Patient Outreach (Signed)
[facility]) Care Management  08/13/2020  Katherine Oliver [facility] Jul 03, 1946 [id number]   Telephone Assessment-Successful (DM)  RN spoke with pt today and received an update on her ongoing management of care. Pt report a new endocrinologist on a new visit this week Wed. t reports her last A1c in Sept was 7/1 from 6/2 in May however indicated she was on tube-feeds for several months and unable to tolerate any orals and this is why her glucose levels were more controlled. Pt states she is trying to consume a healthier diet and ambulate more throughout the home. Reports her average glucose readings are around 200 fasting. Pt states her last appointment was in Sept when her insulin units were increased due to her A1c and average readings. Pt hopes to reduce these numbers and wants to be more active with more engery. Pt indicates [facility] has completed there term and left information on ongoing exercises and what to do to continue to be more active. Pt states she is able to climb 14 steps to the upstairs level in her home.  Plan of care discussed with all goals and interventions. Will follow up next on pt's ongoing management of care related to the ongoing care plan.  Goals Addressed            This Visit's Progress   . COMPLETED: [facility] update 07/13/2020       .CARE PLAN ENTRY (see longtitudinal plan of care for additional care plan information)  Objective:  Lab Results  Component Value Date   HGBA1C 6.2 (H) 03/06/2020 .   Lab Results  Component Value Date   CREATININE 0.91 05/04/2020   CREATININE 0.86 04/28/2020   CREATININE 0.89 04/27/2020 .   Marland Kitchen No results found for: EGFR  Current Barriers:  Marland Kitchen Knowledge Deficits related to basic Diabetes pathophysiology and self care/management . Knowledge Deficits related to medications used for management of diabetes  Case Manager Clinical Goal(s):  Over the next 45 days, patient will demonstrate improved adherence to  prescribed treatment plan for diabetes self care/management as evidenced by: healthier eating habits in managing her diabetes . Verbalize daily monitoring and recording of CBG within 30 days . Verbalize adherence to prescribed medication regimen within the next 30 days   Interventions:  . Provided education to patient about basic DM disease process . Reviewed medications with patient and discussed importance of medication adherence . Provided patient with written educational materials related to hypo and hyperglycemia and importance of correct treatment  Patient Self Care Activities:  . Attends all scheduled provider appointments . Checks blood sugars as prescribed and utilize hyper and hypoglycemia protocol as needed  Initial goal documentation RESOLVING  DUE TO DUPLICATED GOALS    . THN-Monitor and Manage My Blood Sugar       Follow Up Date 09/13/2020   - check blood sugar at prescribed times - check blood sugar if I feel it is too high or too low - enter blood sugar readings and medication or insulin into daily log - take the blood sugar log to all doctor visits - take the blood sugar meter to all doctor visits    Why is this important?   Checking your blood sugar at home helps to keep it from getting very high or very low.  Writing the results in a diary or log helps the doctor know how to care for you.  Your blood sugar log should have the time, date and the results.  Also, write  down the amount of insulin or other medicine that you take.  Other information, like what you ate, exercise done and how you were feeling, will also be helpful.     Notes:     . THN-Set My Target A1C       Follow Up Date 11/13/2019   - set target A1C    Why is this important?   Your target A1C is decided together by you and your doctor.  It is based on several things like your age and other health issues.    Notes:        [name], RN Care Management Coordinator [place] Office [phone]

## 2020-08-15 DIAGNOSIS — N183 Chronic kidney disease, stage 3 unspecified: Secondary | ICD-10-CM | POA: Diagnosis not present

## 2020-08-15 DIAGNOSIS — Z780 Asymptomatic menopausal state: Secondary | ICD-10-CM | POA: Diagnosis not present

## 2020-08-15 DIAGNOSIS — E1122 Type 2 diabetes mellitus with diabetic chronic kidney disease: Secondary | ICD-10-CM | POA: Diagnosis not present

## 2020-08-15 DIAGNOSIS — E139 Other specified diabetes mellitus without complications: Secondary | ICD-10-CM | POA: Diagnosis not present

## 2020-08-15 DIAGNOSIS — Z79899 Other long term (current) drug therapy: Secondary | ICD-10-CM | POA: Diagnosis not present

## 2020-08-15 DIAGNOSIS — Z794 Long term (current) use of insulin: Secondary | ICD-10-CM | POA: Diagnosis not present

## 2020-08-15 DIAGNOSIS — Z23 Encounter for immunization: Secondary | ICD-10-CM | POA: Diagnosis not present

## 2020-08-15 DIAGNOSIS — S36209S Unspecified injury of unspecified part of pancreas, sequela: Secondary | ICD-10-CM | POA: Diagnosis not present

## 2020-08-15 DIAGNOSIS — E1322 Other specified diabetes mellitus with diabetic chronic kidney disease: Secondary | ICD-10-CM | POA: Diagnosis not present

## 2020-08-31 ENCOUNTER — Other Ambulatory Visit: Payer: Self-pay

## 2020-08-31 ENCOUNTER — Ambulatory Visit
Admission: RE | Admit: 2020-08-31 | Discharge: 2020-08-31 | Disposition: A | Discharge status: Home / Self Care | Payer: Medicare Other | Source: Ambulatory Visit | Attending: Family Medicine | Admitting: Family Medicine

## 2020-08-31 DIAGNOSIS — Z1231 Encounter for screening mammogram for malignant neoplasm of breast: Secondary | ICD-10-CM | POA: Insufficient documentation

## 2020-09-13 ENCOUNTER — Other Ambulatory Visit: Payer: Self-pay | Admitting: *Deleted

## 2020-09-13 NOTE — Patient Outreach (Signed)
[facility]) Care Management  09/13/2020  Katherine Oliver [facility] 1945-12-02 [id number]   Telephone Assessment-Unsuccessful  RN attempted outreach call however unsuccessful. RN able to leave a HIPAA approved voice message requesting a call back.  Will rescheduled another outreach call over the next week for ongoing [facility] services.  Katherine Mina, RN Care Management Coordinator [place] Office [phone]

## 2020-09-19 ENCOUNTER — Other Ambulatory Visit: Payer: Self-pay | Admitting: *Deleted

## 2020-09-19 NOTE — Patient Outreach (Signed)
[facility]) Care Management  09/19/2020  Kristilyn Coltrane [facility] 1946-08-25 [id number]  Telephone Assessment-Successful-Diabetes  RN spoke with pt today and received an update. As noted on the reviewed and discussed plan of care pt continue to do well with improved CBG readings (152-162) pending her next A1C at the end of November. Pt has adhered to all medical appointments pending [facility] for her endocrinologist appointment next month. Pt explains how she continues to work with her dietary intake to accommodate her improved CBG readings.   Will continue to encourage adherence to the discussed plan and scheduled next month's follow up on pt's ongoing management of care related to her diabetes as documented below.   Goals Addressed            This Visit's Progress   . THN-Monitor and Manage My Blood Sugar   On track    Follow Up Date 11/02/2020   - check blood sugar at prescribed times - check blood sugar if I feel it is too high or too low - enter blood sugar readings and medication or insulin into daily log - take the blood sugar log to all doctor visits - take the blood sugar meter to all doctor visits    Why is this important?   Checking your blood sugar at home helps to keep it from getting very high or very low.  Writing the results in a diary or log helps the doctor know how to care for you.  Your blood sugar log should have the time, date and the results.  Also, write down the amount of insulin or other medicine that you take.  Other information, like what you ate, exercise done and how you were feeling, will also be helpful.     Notes:  Pt continue to report improved readings 152-162 on average. Pt continue to work with her dietary habits high protein, low carbohydrate. Pt will have a virtual Endocrinologist appointment on 11/23 and follow up with her [facility] provider in Dec. Will extend this goals.    . THN-Set My Target A1C   On track    Follow  Up Date 11/13/2019   - set target A1C    Why is this important?   Your target A1C is decided together by you and your doctor.  It is based on several things like your age and other health issues.    Notes: Pending new A1c end of Nov. CBGs range from [place], RN Care Management Coordinator [place] Office [phone]

## 2020-09-23 ENCOUNTER — Other Ambulatory Visit: Payer: Self-pay | Admitting: Family Medicine

## 2020-09-23 DIAGNOSIS — E1169 Type 2 diabetes mellitus with other specified complication: Secondary | ICD-10-CM

## 2020-09-23 DIAGNOSIS — N183 Chronic kidney disease, stage 3 unspecified: Secondary | ICD-10-CM

## 2020-09-23 DIAGNOSIS — IMO0002 Reserved for concepts with insufficient information to code with codable children: Secondary | ICD-10-CM

## 2020-09-23 DIAGNOSIS — E1122 Type 2 diabetes mellitus with diabetic chronic kidney disease: Secondary | ICD-10-CM

## 2020-09-23 DIAGNOSIS — R946 Abnormal results of thyroid function studies: Secondary | ICD-10-CM

## 2020-09-23 DIAGNOSIS — E1165 Type 2 diabetes mellitus with hyperglycemia: Secondary | ICD-10-CM

## 2020-09-23 DIAGNOSIS — D62 Acute posthemorrhagic anemia: Secondary | ICD-10-CM

## 2020-09-25 ENCOUNTER — Other Ambulatory Visit (INDEPENDENT_AMBULATORY_CARE_PROVIDER_SITE_OTHER): Payer: Medicare Other

## 2020-09-25 ENCOUNTER — Ambulatory Visit (INDEPENDENT_AMBULATORY_CARE_PROVIDER_SITE_OTHER): Payer: Medicare Other

## 2020-09-25 ENCOUNTER — Other Ambulatory Visit: Payer: Self-pay

## 2020-09-25 DIAGNOSIS — N183 Chronic kidney disease, stage 3 unspecified: Secondary | ICD-10-CM | POA: Diagnosis not present

## 2020-09-25 DIAGNOSIS — E1169 Type 2 diabetes mellitus with other specified complication: Secondary | ICD-10-CM

## 2020-09-25 DIAGNOSIS — E118 Type 2 diabetes mellitus with unspecified complications: Secondary | ICD-10-CM

## 2020-09-25 DIAGNOSIS — D62 Acute posthemorrhagic anemia: Secondary | ICD-10-CM

## 2020-09-25 DIAGNOSIS — E1122 Type 2 diabetes mellitus with diabetic chronic kidney disease: Secondary | ICD-10-CM | POA: Diagnosis not present

## 2020-09-25 DIAGNOSIS — Z794 Long term (current) use of insulin: Secondary | ICD-10-CM

## 2020-09-25 DIAGNOSIS — R946 Abnormal results of thyroid function studies: Secondary | ICD-10-CM

## 2020-09-25 DIAGNOSIS — IMO0002 Reserved for concepts with insufficient information to code with codable children: Secondary | ICD-10-CM

## 2020-09-25 DIAGNOSIS — E1165 Type 2 diabetes mellitus with hyperglycemia: Secondary | ICD-10-CM | POA: Diagnosis not present

## 2020-09-25 DIAGNOSIS — Z0000 Encounter for general adult medical examination without abnormal findings: Secondary | ICD-10-CM

## 2020-09-25 DIAGNOSIS — E785 Hyperlipidemia, unspecified: Secondary | ICD-10-CM | POA: Diagnosis not present

## 2020-09-25 LAB — COMPREHENSIVE METABOLIC PANEL
ALT: 12 U/L (ref 0–35)
AST: 12 U/L (ref 0–37)
Albumin: 4.5 g/dL (ref 3.5–5.2)
Alkaline Phosphatase: 104 U/L (ref 39–117)
BUN: 38 mg/dL — ABNORMAL HIGH (ref 6–23)
CO2: 26 mEq/L (ref 19–32)
Calcium: 9.9 mg/dL (ref 8.4–10.5)
Chloride: 104 mEq/L (ref 96–112)
Creatinine, Ser: 1.5 mg/dL — ABNORMAL HIGH (ref 0.40–1.20)
GFR: 33.73 mL/min — ABNORMAL LOW (ref >60.00)
Glucose, Bld: 198 mg/dL — ABNORMAL HIGH (ref 70–99)
Potassium: 4.8 mEq/L (ref 3.5–5.1)
Sodium: 138 mEq/L (ref 135–145)
Total Bilirubin: 0.6 mg/dL (ref 0.2–1.2)
Total Protein: 7.1 g/dL (ref 6.0–8.3)

## 2020-09-25 LAB — LIPID PANEL
Cholesterol: 139 mg/dL (ref 0–200)
HDL: 45.3 mg/dL (ref >39.00)
LDL Cholesterol: 54 mg/dL (ref 0–99)
NonHDL: 93.52
Total CHOL/HDL Ratio: 3
Triglycerides: 199 mg/dL — ABNORMAL HIGH (ref 0.0–149.0)
VLDL: 39.8 mg/dL (ref 0.0–40.0)

## 2020-09-25 LAB — CBC WITH DIFFERENTIAL/PLATELET
Basophils Absolute: 0 10*3/uL (ref 0.0–0.1)
Basophils Relative: 0.6 % (ref 0.0–3.0)
Eosinophils Absolute: 0 10*3/uL (ref 0.0–0.7)
Eosinophils Relative: 0 % (ref 0.0–5.0)
HCT: 38 % (ref 36.0–46.0)
Hemoglobin: 12.6 g/dL (ref 12.0–15.0)
Lymphocytes Relative: 22.1 % (ref 12.0–46.0)
Lymphs Abs: 1.2 10*3/uL (ref 0.7–4.0)
MCHC: 33.1 g/dL (ref 30.0–36.0)
MCV: 77.4 fl — ABNORMAL LOW (ref 78.0–100.0)
Monocytes Absolute: 0.4 10*3/uL (ref 0.1–1.0)
Monocytes Relative: 7.4 % (ref 3.0–12.0)
Neutro Abs: 3.7 10*3/uL (ref 1.4–7.7)
Neutrophils Relative %: 69.9 % (ref 43.0–77.0)
Platelets: 102 10*3/uL — ABNORMAL LOW (ref 150.0–400.0)
RBC: 4.9 Mil/uL (ref 3.87–5.11)
RDW: 15.2 % (ref 11.5–15.5)
WBC: 5.3 10*3/uL (ref 4.0–10.5)

## 2020-09-25 LAB — IBC PANEL
Iron: 40 ug/dL — ABNORMAL LOW (ref 42–145)
Saturation Ratios: 13.7 % — ABNORMAL LOW (ref 20.0–50.0)
Transferrin: 209 mg/dL — ABNORMAL LOW (ref 212.0–360.0)

## 2020-09-25 LAB — FERRITIN: Ferritin: 198.6 ng/mL (ref 10.0–291.0)

## 2020-09-25 LAB — TSH: TSH: 3.27 u[IU]/mL (ref 0.35–4.50)

## 2020-09-25 LAB — VITAMIN D 25 HYDROXY (VIT D DEFICIENCY, FRACTURES): VITD: 28.92 ng/mL — ABNORMAL LOW (ref 30.00–100.00)

## 2020-09-25 LAB — T4, FREE: Free T4: 0.78 ng/dL (ref 0.60–1.60)

## 2020-09-25 LAB — HEMOGLOBIN A1C: Hgb A1c MFr Bld: 8.9 % — ABNORMAL HIGH (ref 4.6–6.5)

## 2020-09-25 NOTE — Progress Notes (Signed)
Subjective:   Katherine Oliver is a 74 y.o. female who presents for Medicare Annual (Subsequent) preventive examination.  Review of Systems: N/A      I connected with the patient today by telephone and verified that I am speaking with the correct person using two identifiers. Location patient: home Location nurse: work Persons participating in the telephone visit: patient, nurse.   I discussed the limitations, risks, security and privacy concerns of performing an evaluation and management service by telephone and the availability of in person appointments. I also discussed with the patient that there may be a patient responsible charge related to this service. The patient expressed understanding and verbally consented to this telephonic visit.        Cardiac Risk Factors include: advanced age (>69men, >36 women);diabetes mellitus;hypertension     Objective:    Today's Vitals   09/25/20 1115  PainSc: 3    There is no height or weight on file to calculate BMI.  Advanced Directives 09/25/2020 04/24/2020 04/24/2020 04/23/2020 04/22/2020 03/06/2020 01/06/2020  Does Patient Have a Medical Advance Directive? No - Yes No No No Yes  Type of Advance Directive - - [place];Living will  Does patient want to make changes to medical advance directive? - No - Patient declined - - - - Yes (ED - Information included in AVS)  Copy of [place] in Chart? - - - - - - No - copy requested  Would patient like information on creating a medical advance directive? Yes (MAU/Ambulatory/Procedural Areas - Information given) - - No - Patient declined - No - Patient declined -    Current Medications (verified) Outpatient Encounter Medications as of 09/25/2020  Medication Sig  . ACCU-CHEK AVIVA PLUS test strip USE AS DIRECTED TO CHECK BLOOD SUGARS ONCE DAILY E11.21  . acetaminophen (TYLENOL) 325 MG tablet Take 650 mg by mouth every  6 (six) hours as needed for mild pain.  . benazepril (LOTENSIN) 10 MG tablet Take 1 tablet (10 mg total) by mouth daily.  Marland Kitchen conjugated estrogens (PREMARIN) vaginal cream Place 1 Applicatorful vaginally daily. Only to be applied topically  . Continuous Blood Gluc Transmit (DEXCOM G6 TRANSMITTER) MISC Dispense Dexcom G6 Transmitter  . CREON 36000-114000 units CPEP capsule SMARTSIG:Capsule(s) By Mouth As Directed  . ferrous sulfate 325 (65 FE) MG tablet Take 1 tablet (325 mg total) by mouth daily with breakfast.  . folic acid (FOLVITE) 1 MG tablet Take 1 tablet (1 mg total) by mouth daily.  . hydrochlorothiazide (MICROZIDE) 12.5 MG capsule Take 1 capsule (12.5 mg total) by mouth daily.  . insulin lispro (HUMALOG) 100 UNIT/ML injection Inject 4-14 Units into the skin 3 (three) times daily before meals. (according to sliding scale - max 36u daily)   . LANTUS SOLOSTAR 100 UNIT/ML Solostar Pen Inject 14 Units into the skin at bedtime.   Marland Kitchen loperamide (IMODIUM A-D) 2 MG tablet Take 2 mg by mouth 4 (four) times daily as needed for diarrhea or loose stools.  . metoprolol succinate (TOPROL XL) 25 MG 24 hr tablet Take 1 tablet (25 mg total) by mouth daily.  . Multiple Vitamin (MULTIVITAMIN) tablet 1 tablet by Per J Tube route daily.   . pantoprazole (PROTONIX) 40 MG tablet Take 1 tablet (40 mg total) by mouth daily.  Marland Kitchen trimethoprim (TRIMPEX) 100 MG tablet Take 1 tablet (100 mg total) by mouth daily.  . meclizine (ANTIVERT) 25 MG tablet Take 1  tablet (25 mg total) by mouth 3 (three) times daily as needed for nausea. (Patient not taking: Reported on 09/25/2020)   No facility-administered encounter medications on file as of 09/25/2020.    Allergies (verified) Azithromycin, Nickel, Sulfa antibiotics, Vinegar [acetic acid], Adhesive [tape], and Keflex [cephalexin]   History: Past Medical History:  Diagnosis Date  . Allergic rhinitis   . Arthritis   . 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 nephropathy ([place])    DSME [facility] 01/2016   . DKA (diabetic ketoacidoses) 04/22/2020  . Frequent epistaxis 05/16/2019   S/p cauterization with resolution 2020  . GERD (gastroesophageal reflux disease)   . Hepatic steatosis    by abd [place] 05/2012, mild transaminitis - normal iron sat and viral hep panel (2011), stable [place] 2017  . History of chicken pox   . History of measles   . History of recurrent UTIs    on chronic keflex  . HLD (hyperlipidemia)   . HTN (hypertension)   . 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 Kidney cyst, acquired 01/2016   L kidney by [place]  . Kidney stone 01/2016   L kidney by [place]  . Lung nodules 11/2013   overall stable on f/u CT 01/2016  . Osteopenia 06/2013   mild, forearm T -1.1, hip and spine WNL  . Pancreatitis   . Polycythemia    mild, stable (2013)  . Primary localized osteoarthritis of right knee 01/05/2019  . Rosacea    metrogel   Past Surgical History:  Procedure Laterality Date  . APPENDECTOMY  1987  . BILIARY STENT PLACEMENT  12/14/2019   Procedure: BILIARY STENT PLACEMENT;  Surgeon: [name], MD;  Location: [place];  Service: Endoscopy;;  . BREAST BIOPSY Right 1963   benign  . BREAST BIOPSY Right 08/2014   benign- core  . cardiolite stress test  04/2004   normal  . CESAREAN SECTION  3491;7915   x2  . CHOLECYSTECTOMY  2003  . COLONOSCOPY  09/26/2008   adenomatous polyp, rpt 3-5 yrs  . COLONOSCOPY  08/2012   adenomatous polyps, diverticulosis, rec rpt 5 yrs [name])  . COLONOSCOPY WITH PROPOFOL N/A 02/05/2018   4TA, SSA, diverticulosis, rpt 3 yrs [name], [name], MD)  . dexa  2003   normal  . dexa  06/2013   [facility] - Tscore -1.1 forearm, normal spine and femur  . ERCP N/A 12/14/2019   Procedure: ENDOSCOPIC RETROGRADE CHOLANGIOPANCREATOGRAPHY (ERCP);  Surgeon: [name], MD;  Location: [place];  Service: Endoscopy;  Laterality: N/A;  . ERCP  01/2020   nonbleeding gastric ulcer - [facility] hospitalization (Dr Mont Dutton)  . ESOPHAGOGASTRODUODENOSCOPY N/A 12/19/2019   Procedure: ESOPHAGOGASTRODUODENOSCOPY (EGD);  Surgeon: [name], MD;  Location: [facility] ENDOSCOPY;  Service: Endoscopy;  Laterality: N/A;  . ESOPHAGOGASTRODUODENOSCOPY  01/2020   pre existing AXIOS cystogastrostomy stent s/p necrosectomy - [facility] hospitalization (Dr Mont Dutton)  . ESOPHAGOGASTRODUODENOSCOPY N/A 04/24/2020   Procedure: ESOPHAGOGASTRODUODENOSCOPY (EGD);  Surgeon: Wonda Horner, MD;  Location: [facility] ENDOSCOPY;  Service: Endoscopy;  Laterality: N/A;  . ESOPHAGOGASTRODUODENOSCOPY  05/2020   normal esophagus, duodenum, erythematous mucosa in gastric body, gastric cystogastrostomy stent removed ([facility])  . IR FLUORO GUIDE CV LINE RIGHT  12/27/2019  . IR FLUORO GUIDE CV LINE RIGHT  04/24/2020  . IR IVC FILTER PLMT / S&I /  IMG GUID/MOD SED  04/26/2020  . IR RADIOLOGIST EVAL & MGMT  07/31/2020  . IR REMOVAL TUN CV CATH W/O FL  01/03/2020  . IR REMOVAL TUN CV CATH W/O FL  04/29/2020  . IR REPLC DUODEN/JEJUNO TUBE PERCUT W/FLUORO  04/01/2020  . IR US GUIDE VASC ACCESS RIGHT  04/24/2020  . SPHINCTEROTOMY  12/14/2019   Procedure: SPHINCTEROTOMY;  Surgeon: [name], MD;  Location: [place];  Service: Endoscopy;;  . 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 TRANSTHORACIC ECHOCARDIOGRAM  04/2019   EF 55-60%, modLVH, impaired relaxation   . TRIGGER FINGER RELEASE  2007;2010;2011   bilateral  . TRIGGER FINGER RELEASE  02/2017  . VAGINAL HYSTERECTOMY  1984   for menorrhagia, ovaries in place   Family History  Problem Relation Age of Onset  . Stroke Mother        several  . Hyperlipidemia Mother   . Hypertension Mother   . Cancer Father        colon  . Hypertension Father   .  Hyperlipidemia Father   . Coronary artery disease Father 65       MIx1, CABG  . Cancer Paternal Aunt        abdominal  . Coronary artery disease Maternal Grandmother   . Diabetes Maternal Grandfather   . Coronary artery disease Maternal Grandfather   . Breast cancer Neg Hx    Social History   Socioeconomic History  . Marital status: Married    Spouse name: Not on file  . Number of children: Not on file  . Years of education: Not on file  . Highest education level: Not on file  Occupational History  . Not on file  Tobacco Use  . Smoking status: Never Smoker  . Smokeless tobacco: Never Used  Vaping Use  . Vaping Use: Never used  Substance and Sexual Activity  . Alcohol use: Not Currently  . Drug use: No  . Sexual activity: Yes  Other Topics Concern  . 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 Daily walking about 20 min.    Diet: ok water intake 4 glasses/day, daily fruits/vegetables, red meat 4x/wk, fish 3-4x/wk   Social Determinants of Health   Financial Resource Strain: Low Risk   . Difficulty of Paying Living Expenses: Not hard at all  Food Insecurity: No Food Insecurity  . Worried About Charity fundraiser in the Last Year: Never true  . Ran Out of Food in the Last Year: Never true  Transportation Needs: No Transportation Needs  . Lack of Transportation (Medical): No  . Lack of Transportation (Non-Medical): No  Physical Activity: Inactive  . Days of Exercise per Week: 0 days  . Minutes of Exercise per Session: 0 min  Stress: No Stress Concern Present  . Feeling of Stress : Not at all  Social Connections:   . Frequency of Communication with Friends and Family: Not on file  . Frequency of Social Gatherings with Friends and Family:  Not on file  . Attends Religious Services: Not on file  . Active Member of Clubs or Organizations: Not on  file  . Attends Archivist Meetings: Not on file  . Marital Status: Not on file    Tobacco Counseling Counseling given: Not Answered   Clinical Intake:  Pre-visit preparation completed: Yes  Pain : 0-10 Pain Score: 3  Pain Type: Acute pain Pain Location: Hand Pain Orientation: Left Pain Descriptors / Indicators: Sore Pain Onset: 1 to 4 weeks ago Pain Frequency: Intermittent     Nutritional Risks: Nausea/ vomitting/ diarrhea (diarrhea sometimes due to antibiotic use) Diabetes: Yes CBG done?: No Did pt. bring in CBG monitor from home?: No  How often do you need to have someone help you when you read instructions, pamphlets, or other written materials from your doctor or pharmacy?: 1 - Never What is the last grade level you completed in school?: Masters  Diabetic: Yes Nutrition Risk Assessment:  Has the patient had any N/V/D within the last 2 months?  Yes , diarrhea due to antibiotic use  Does the patient have any non-healing wounds?  No  Has the patient had any unintentional weight loss or weight gain?  No   Diabetes:  Is the patient diabetic?  Yes  If diabetic, was a CBG obtained today? Yes 199, per patient @ home  Did the patient bring in their glucometer from home?  No , telephone visit  How often do you monitor your CBG's? Four times a day.   Financial Strains and Diabetes Management:  Are you having any financial strains with the device, your supplies or your medication? No .  Does the patient want to be seen by Chronic Care Management for management of their diabetes?  No  Would the patient like to be referred to a Nutritionist or for Diabetic Management?  No   Diabetic Exams:  Diabetic Eye Exam: Overdue for diabetic eye exam. Pt has been advised about the importance in completing this exam. Patient advised to call and schedule an eye  exam. Diabetic Foot Exam: Overdue, Pt has been advised about the importance in completing this exam. Pt is scheduled for diabetic foot exam on 10/02/2020.   Interpreter Needed?: No  Information entered by :: CJohnson, LPN   Activities of Daily Living In your present state of health, do you have any difficulty performing the following activities: 09/25/2020 05/10/2020  Hearing? N N  Vision? N N  Difficulty concentrating or making decisions? N N  Walking or climbing stairs? N Y  Comment - Use walker  Dressing or bathing? N N  Doing errands, shopping? N Y  Comment - Family assist withthis task (husband)  Conservation officer, nature and eating ? N N  Using the Toilet? N N  In the past six months, have you accidently leaked urine? N N  Do you have problems with loss of bowel control? N N  Managing your Medications? N N  Managing your Finances? N N  Housekeeping or managing your Housekeeping? N N  Some recent data might be hidden    Patient Care Team: [name], MD as PCP - General (Family Medicine) [name], MD as Consulting Physician (Orthopedic Surgery) Ralene Bathe, MD as Consulting Physician (Dermatology) Johnnette Litter, MD as Consulting Physician (Dentistry) Thelma Comp, [place] as Consulting Physician (Optometry) Tobi Bastos, RN as Case Manager  Indicate any recent Medical Services you may have received from other than [facility] providers in the past year (date may be approximate).     Assessment:   This is a routine wellness examination for [place].  Hearing/Vision screen  Hearing  Screening   125Hz  250Hz  500Hz  1000Hz  2000Hz  3000Hz  4000Hz  6000Hz  8000Hz   Right ear:           Left ear:           Vision Screening Comments: Patient gets annual eye exams   Dietary issues and exercise activities discussed: Current Exercise Habits: The patient does not participate in regular exercise at present, Exercise limited by: None identified  Goals    . Increase physical  activity     Starting 08/16/2018, I will continue to exercise for 60 minutes 3 days per week.     . Patient Stated     08/23/2019, I will try to lose about 10-30 lbs in the future.     . Patient Stated     09/25/2020, I will continue to do housework and work in my garden periodically.     . THN-Monitor and Manage My Blood Sugar     Follow Up Date 11/02/2020   - check blood sugar at prescribed times - check blood sugar if I feel it is too high or too low - enter blood sugar readings and medication or insulin into daily log - take the blood sugar log to all doctor visits - take the blood sugar meter to all doctor visits    Why is this important?   Checking your blood sugar at home helps to keep it from getting very high or very low.  Writing the results in a diary or log helps the doctor know how to care for you.  Your blood sugar log should have the time, date and the results.  Also, write down the amount of insulin or other medicine that you take.  Other information, like what you ate, exercise done and how you were feeling, will also be helpful.     Notes:  Pt continue to report improved readings 152-162 on average. Pt continue to work with her dietary habits high protein, low carbohydrate. Pt will have a virtual Endocrinologist appointment on 11/23 and follow up with her [facility] provider in Dec. Will extend this goals.    . THN-Set My Target A1C     Follow Up Date 11/13/2019   - set target A1C    Why is this important?   Your target A1C is decided together by you and your doctor.  It is based on several things like your age and other health issues.    Notes: Pending new A1c end of Nov. CBGs range from [place] 2/9 Scores 09/25/2020 05/10/2020 08/23/2019 08/16/2018 08/10/2017 07/14/2016 07/05/2015  PHQ - 2 Score 0 0 0 0 0 0 0  PHQ- 9 Score 0 - 0 0 0 - -    Fall Risk Fall Risk  09/25/2020 05/10/2020 08/23/2019 09/21/2018 08/16/2018  Falls in the past year?  1 1 0 0 No  Comment - - - Emmi Telephone Survey: data to providers prior to load -  Number falls in past yr: 1 0 0 - -  Injury with Fall? 0 1 0 - -  Risk for fall due to : Medication side effect Impaired balance/gait Medication side effect - -  Follow up Falls evaluation completed;Falls prevention discussed Falls prevention discussed Falls evaluation completed;Falls prevention discussed - -  Comment - Much improved - - -    Any stairs in or around the home? Yes  If so, are there any without handrails? No  Home free of loose throw rugs in walkways,  pet beds, electrical cords, etc? Yes  Adequate lighting in your home to reduce risk of falls? Yes   ASSISTIVE DEVICES UTILIZED TO PREVENT FALLS:  Life alert? No  Use of a cane, walker or w/c? No  Grab bars in the bathroom? Yes  Shower chair or bench in shower? No  Elevated toilet seat or a handicapped toilet? Yes   TIMED UP AND GO:  Was the test performed? N/A, telephone visit.   Cognitive Function: MMSE - Mini Mental State Exam 09/25/2020 08/23/2019 08/16/2018 08/10/2017 07/14/2016  Orientation to time 5 5 5 5 5   Orientation to Place 5 5 5 5 5   Registration 3 3 3 3 3   Attention/ Calculation 5 5 0 0 0  Recall 3 3 3 3 3   Language- name 2 objects - - 0 0 0  Language- repeat 1 1 1 1 1   Language- follow 3 step command - - 3 3 3   Language- read & follow direction - - 0 0 0  Write a sentence - - 0 0 0  Copy design - - 0 0 0  Total score - - 20 20 20   Mini Cog  Mini-Cog screen was completed. Maximum score is 22. A value of 0 denotes this part of the MMSE was not completed or the patient failed this part of the Mini-Cog screening.       Immunizations Immunization History  Administered Date(s) Administered  . Fluad Quad(high Dose 65+) 07/28/2019  . Influenza, High Dose Seasonal PF 08/10/2017, 08/16/2018, 08/15/2020  . Influenza,inj,Quad PF,6+ Mos 08/02/2014, 07/05/2015, 07/14/2016  . Influenza-Unspecified 08/03/2013  . PFIZER  SARS-COV-2 Vaccination 11/17/2019, 12/08/2019  . Pneumococcal Conjugate-13 06/30/2014  . Pneumococcal Polysaccharide-23 11/03/2002, 09/20/2007, 05/31/2012  . Td 01/20/2005, 08/10/2017  . Zoster 11/03/2008    TDAP status: Up to date Flu Vaccine status: Up to date Pneumococcal vaccine status: Up to date Covid-19 vaccine status: Completed vaccines  Qualifies for Shingles Vaccine? Yes   Zostavax completed Yes   Shingrix Completed?: No.    Education has been provided regarding the importance of this vaccine. Patient has been advised to call insurance company to determine out of pocket expense if they have not yet received this vaccine. Advised may also receive vaccine at local pharmacy or Health Dept. Verbalized acceptance and understanding.  Screening Tests Health Maintenance  Topic Date Due  . FOOT EXAM  05/20/2019  . OPHTHALMOLOGY EXAM  08/28/2020  . HEMOGLOBIN A1C  09/06/2020  . MAMMOGRAM  08/31/2021  . COLONOSCOPY  02/06/2023  . TETANUS/TDAP  08/11/2027  . INFLUENZA VACCINE  Completed  . DEXA SCAN  Completed  . COVID-19 Vaccine  Completed  . Hepatitis C Screening  Completed  . PNA vac Low Risk Adult  Completed    Health Maintenance  Health Maintenance Due  Topic Date Due  . FOOT EXAM  05/20/2019  . OPHTHALMOLOGY EXAM  08/28/2020  . HEMOGLOBIN A1C  09/06/2020    Colorectal cancer screening: Completed 02/05/2018. Repeat every 5 years Mammogram status: Completed 08/31/2020. Repeat every year Bone Density status: Completed 09/05/2019. Results reflect: Bone density results: OSTEOPENIA. Repeat every 2 years.  Lung Cancer Screening: (Low Dose CT Chest recommended if Age 72-80 years, 30 pack-year currently smoking OR have quit w/in 15 years) does not qualify.    Additional Screening:  Hepatitis C Screening: does qualify; Completed 07/31/2016  Vision Screening: Recommended annual ophthalmology exams for early detection of glaucoma and other disorders of the eye. Is the patient  up to date with  their annual eye exam?  Yes , scheduled 10/01/2020 per patient  Who is the provider or what is the name of the office in which the patient attends annual eye exams? Dr. Thelma Comp If pt is not established with a provider, would they like to be referred to a provider to establish care? No .   Dental Screening: Recommended annual dental exams for proper oral hygiene  Community Resource Referral / Chronic Care Management: CRR required this visit?  No   CCM required this visit?  No      Plan:     I have personally reviewed and noted the following in the patient's chart:   . Medical and social history . Use of alcohol, tobacco or illicit drugs  . Current medications and supplements . Functional ability and status . Nutritional status . Physical activity . Advanced directives . List of other physicians . Hospitalizations, surgeries, and ER visits in previous 12 months . Vitals . Screenings to include cognitive, depression, and falls . Referrals and appointments  In addition, I have reviewed and discussed with patient certain preventive protocols, quality metrics, and best practice recommendations. A written personalized care plan for preventive services as well as general preventive health recommendations were provided to patient.    Due to this being a telephonic visit, the after visit summary with patients personalized plan was offered to patient via office or my-chart. Patient preferred to pick up at office at next visit or via mychart.   Andrez Grime, LPN   [id number]

## 2020-09-25 NOTE — Patient Instructions (Signed)
Katherine Oliver , Thank you for taking time to come for your Medicare Wellness Visit. I appreciate your ongoing commitment to your health goals. Please review the following plan we discussed and let me know if I can assist you in the future.   Screening recommendations/referrals: Colonoscopy: Up to date, completed 02/05/2018, due 02/2023 Mammogram: Up to date, completed 08/31/2020, due 08/2021 Bone Density: Up to date, completed 09/05/2019, due 09/2021 Recommended yearly ophthalmology/optometry visit for glaucoma screening and checkup Recommended yearly dental visit for hygiene and checkup  Vaccinations: Influenza vaccine: Up to date, completed 08/15/2020, due 06/2021 Pneumococcal vaccine: Completed series Tdap vaccine: Up to date, completed 08/10/2017, due 08/2027 Shingles vaccine: due, check with your insurance regarding coverage/cost if interested   Covid-19:Completed series  Advanced directives: Please bring a copy of your POA (Power of Cecilia) and/or Living Will to your next appointment.   Conditions/risks identified: Diabetes, Hypertension  Next appointment: Follow up in one year for your annual wellness visit   Preventive Care 65 Years and Older, Female Preventive care refers to lifestyle choices and visits with your health care provider that can promote health and wellness. What does preventive care include?  A yearly physical exam. This is also called an annual well check.  Dental exams once or twice a year.  Routine eye exams. Ask your health care provider how often you should have your eyes checked.  Personal lifestyle choices, including:  Daily care of your teeth and gums.  Regular physical activity.  Eating a healthy diet.  Avoiding tobacco and drug use.  Limiting alcohol use.  Practicing safe sex.  Taking low-dose aspirin every day.  Taking vitamin and mineral supplements as recommended by your health care provider. What happens during an annual well  check? The services and screenings done by your health care provider during your annual well check will depend on your age, overall health, lifestyle risk factors, and family history of disease. Counseling  Your health care provider may ask you questions about your:  Alcohol use.  Tobacco use.  Drug use.  Emotional well-being.  Home and relationship well-being.  Sexual activity.  Eating habits.  History of falls.  Memory and ability to understand (cognition).  Work and work Statistician.  Reproductive health. Screening  You may have the following tests or measurements:  Height, weight, and BMI.  Blood pressure.  Lipid and cholesterol levels. These may be checked every 5 years, or more frequently if you are over 45 years old.  Skin check.  Lung cancer screening. You may have this screening every year starting at age 17 if you have a 30-pack-year history of smoking and currently smoke or have quit within the past 15 years.  Fecal occult blood test (FOBT) of the stool. You may have this test every year starting at age 81.  Flexible sigmoidoscopy or colonoscopy. You may have a sigmoidoscopy every 5 years or a colonoscopy every 10 years starting at age 60.  Hepatitis C blood test.  Hepatitis B blood test.  Sexually transmitted disease (STD) testing.  Diabetes screening. This is done by checking your blood sugar (glucose) after you have not eaten for a while (fasting). You may have this done every 1-3 years.  Bone density scan. This is done to screen for osteoporosis. You may have this done starting at age 18.  Mammogram. This may be done every 1-2 years. Talk to your health care provider about how often you should have regular mammograms. Talk with your health care provider about  your test results, treatment options, and if necessary, the need for more tests. Vaccines  Your health care provider may recommend certain vaccines, such as:  Influenza vaccine. This is  recommended every year.  Tetanus, diphtheria, and acellular pertussis (Tdap, Td) vaccine. You may need a Td booster every 10 years.  Zoster vaccine. You may need this after age 65.  Pneumococcal 13-valent conjugate (PCV13) vaccine. One dose is recommended after age 63.  Pneumococcal polysaccharide (PPSV23) vaccine. One dose is recommended after age 77. Talk to your health care provider about which screenings and vaccines you need and how often you need them. This information is not intended to replace advice given to you by your health care provider. Make sure you discuss any questions you have with your health care provider. Document Released: 11/16/2015 Document Revised: 07/09/2016 Document Reviewed: 08/21/2015 Elsevier Interactive Patient Education  2017 [employer] Prevention in the Home Falls can cause injuries. They can happen to people of all ages. There are many things you can do to make your home safe and to help prevent falls. What can I do on the outside of my home?  Regularly fix the edges of walkways and driveways and fix any cracks.  Remove anything that might make you trip as you walk through a door, such as a raised step or threshold.  Trim any bushes or trees on the path to your home.  Use bright outdoor lighting.  Clear any walking paths of anything that might make someone trip, such as rocks or tools.  Regularly check to see if handrails are loose or broken. Make sure that both sides of any steps have handrails.  Any raised decks and porches should have guardrails on the edges.  Have any leaves, snow, or ice cleared regularly.  Use sand or salt on walking paths during winter.  Clean up any spills in your garage right away. This includes oil or grease spills. What can I do in the bathroom?  Use night lights.  Install grab bars by the toilet and in the tub and shower. Do not use towel bars as grab bars.  Use non-skid mats or decals in the tub or  shower.  If you need to sit down in the shower, use a plastic, non-slip stool.  Keep the floor dry. Clean up any water that spills on the floor as soon as it happens.  Remove soap buildup in the tub or shower regularly.  Attach bath mats securely with double-sided non-slip rug tape.  Do not have throw rugs and other things on the floor that can make you trip. What can I do in the bedroom?  Use night lights.  Make sure that you have a light by your bed that is easy to reach.  Do not use any sheets or blankets that are too big for your bed. They should not hang down onto the floor.  Have a firm chair that has side arms. You can use this for support while you get dressed.  Do not have throw rugs and other things on the floor that can make you trip. What can I do in the kitchen?  Clean up any spills right away.  Avoid walking on wet floors.  Keep items that you use a lot in easy-to-reach places.  If you need to reach something above you, use a strong step stool that has a grab bar.  Keep electrical cords out of the way.  Do not use floor polish or wax  that makes floors slippery. If you must use wax, use non-skid floor wax.  Do not have throw rugs and other things on the floor that can make you trip. What can I do with my stairs?  Do not leave any items on the stairs.  Make sure that there are handrails on both sides of the stairs and use them. Fix handrails that are broken or loose. Make sure that handrails are as long as the stairways.  Check any carpeting to make sure that it is firmly attached to the stairs. Fix any carpet that is loose or worn.  Avoid having throw rugs at the top or bottom of the stairs. If you do have throw rugs, attach them to the floor with carpet tape.  Make sure that you have a light switch at the top of the stairs and the bottom of the stairs. If you do not have them, ask someone to add them for you. What else can I do to help prevent  falls?  Wear shoes that:  Do not have high heels.  Have rubber bottoms.  Are comfortable and fit you well.  Are closed at the toe. Do not wear sandals.  If you use a stepladder:  Make sure that it is fully opened. Do not climb a closed stepladder.  Make sure that both sides of the stepladder are locked into place.  Ask someone to hold it for you, if possible.  Clearly mark and make sure that you can see:  Any grab bars or handrails.  First and last steps.  Where the edge of each step is.  Use tools that help you move around (mobility aids) if they are needed. These include:  Canes.  Walkers.  Scooters.  Crutches.  Turn on the lights when you go into a dark area. Replace any light bulbs as soon as they burn out.  Set up your furniture so you have a clear path. Avoid moving your furniture around.  If any of your floors are uneven, fix them.  If there are any pets around you, be aware of where they are.  Review your medicines with your doctor. Some medicines can make you feel dizzy. This can increase your chance of falling. Ask your doctor what other things that you can do to help prevent falls. This information is not intended to replace advice given to you by your health care provider. Make sure you discuss any questions you have with your health care provider. Document Released: 08/16/2009 Document Revised: 03/27/2016 Document Reviewed: 11/24/2014 Elsevier Interactive Patient Education  2017 [employer].

## 2020-09-25 NOTE — Progress Notes (Signed)
PCP notes:  Health Maintenance: Eye exam- scheduled 10/01/2020 Foot exam- due   Abnormal Screenings: none   Patient concerns: Discuss COVID booster Antibody levels tested for COVID  Discuss referral to cardiology   Nurse concerns:  none  Next PCP appt.: 10/02/2020 @ 11:30 am

## 2020-10-01 DIAGNOSIS — E119 Type 2 diabetes mellitus without complications: Secondary | ICD-10-CM | POA: Diagnosis not present

## 2020-10-01 DIAGNOSIS — H524 Presbyopia: Secondary | ICD-10-CM | POA: Diagnosis not present

## 2020-10-01 DIAGNOSIS — H2513 Age-related nuclear cataract, bilateral: Secondary | ICD-10-CM | POA: Diagnosis not present

## 2020-10-01 DIAGNOSIS — H5213 Myopia, bilateral: Secondary | ICD-10-CM | POA: Diagnosis not present

## 2020-10-01 LAB — HM DIABETES EYE EXAM

## 2020-10-02 ENCOUNTER — Ambulatory Visit (INDEPENDENT_AMBULATORY_CARE_PROVIDER_SITE_OTHER): Payer: Medicare Other | Admitting: Family Medicine

## 2020-10-02 ENCOUNTER — Encounter: Payer: Self-pay | Admitting: Family Medicine

## 2020-10-02 ENCOUNTER — Other Ambulatory Visit: Payer: Self-pay

## 2020-10-02 VITALS — BP 140/80 | HR 70 | Temp 97.6°F | Ht 60.5 in | Wt 137.2 lb

## 2020-10-02 DIAGNOSIS — E1122 Type 2 diabetes mellitus with diabetic chronic kidney disease: Secondary | ICD-10-CM | POA: Diagnosis not present

## 2020-10-02 DIAGNOSIS — I700 Atherosclerosis of aorta: Secondary | ICD-10-CM

## 2020-10-02 DIAGNOSIS — K529 Noninfective gastroenteritis and colitis, unspecified: Secondary | ICD-10-CM

## 2020-10-02 DIAGNOSIS — I444 Left anterior fascicular block: Secondary | ICD-10-CM | POA: Diagnosis not present

## 2020-10-02 DIAGNOSIS — K219 Gastro-esophageal reflux disease without esophagitis: Secondary | ICD-10-CM | POA: Diagnosis not present

## 2020-10-02 DIAGNOSIS — IMO0002 Reserved for concepts with insufficient information to code with codable children: Secondary | ICD-10-CM

## 2020-10-02 DIAGNOSIS — M25542 Pain in joints of left hand: Secondary | ICD-10-CM | POA: Diagnosis not present

## 2020-10-02 DIAGNOSIS — I10 Essential (primary) hypertension: Secondary | ICD-10-CM | POA: Diagnosis not present

## 2020-10-02 DIAGNOSIS — E118 Type 2 diabetes mellitus with unspecified complications: Secondary | ICD-10-CM

## 2020-10-02 DIAGNOSIS — Z87440 Personal history of urinary (tract) infections: Secondary | ICD-10-CM

## 2020-10-02 DIAGNOSIS — Z95828 Presence of other vascular implants and grafts: Secondary | ICD-10-CM

## 2020-10-02 DIAGNOSIS — Z7189 Other specified counseling: Secondary | ICD-10-CM

## 2020-10-02 DIAGNOSIS — Z794 Long term (current) use of insulin: Secondary | ICD-10-CM

## 2020-10-02 DIAGNOSIS — I4891 Unspecified atrial fibrillation: Secondary | ICD-10-CM

## 2020-10-02 DIAGNOSIS — M85852 Other specified disorders of bone density and structure, left thigh: Secondary | ICD-10-CM

## 2020-10-02 DIAGNOSIS — E1169 Type 2 diabetes mellitus with other specified complication: Secondary | ICD-10-CM

## 2020-10-02 DIAGNOSIS — E785 Hyperlipidemia, unspecified: Secondary | ICD-10-CM

## 2020-10-02 DIAGNOSIS — E1165 Type 2 diabetes mellitus with hyperglycemia: Secondary | ICD-10-CM

## 2020-10-02 DIAGNOSIS — K8591 Acute pancreatitis with uninfected necrosis, unspecified: Secondary | ICD-10-CM | POA: Diagnosis not present

## 2020-10-02 DIAGNOSIS — N183 Chronic kidney disease, stage 3 unspecified: Secondary | ICD-10-CM

## 2020-10-02 DIAGNOSIS — N838 Other noninflammatory disorders of ovary, fallopian tube and broad ligament: Secondary | ICD-10-CM

## 2020-10-02 MED ORDER — ROSUVASTATIN CALCIUM 5 MG PO TABS: 5.0000 mg | ORAL_TABLET | Freq: Every day | ORAL | 3 refills | Status: DC

## 2020-10-02 MED ORDER — VITAMIN D3 25 MCG (1000 UT) PO CAPS: 1.0000 | ORAL_CAPSULE | Freq: Every day | ORAL | Status: DC

## 2020-10-02 NOTE — Assessment & Plan Note (Signed)
Not present on rpt EKG 04/2020

## 2020-10-02 NOTE — Assessment & Plan Note (Signed)
Chronic, stable on current regimen. She added hctz due to elevated readings. See below for plan.

## 2020-10-02 NOTE — Patient Instructions (Addendum)
If interested, check with pharmacy about new 2 shot shingles series (shingrix).  Start vit D 1000 units daily.  Hold hctz and increase water, recheck kidney function in 2 weeks. Monitor blood pressures, let us know if consistently >140/90.  Bring [place] a copy of living will when completed to update your chart.  I will order DEXA scan - check with endocrinology about latest one.  Try diclofenac (voltaren) gel to left thumb joint. Let usk now if not better with this.  Good to see you today  Return as needed or in 3 months for follow up visit.  Return in 2 weeks for lab visit only.   Health Maintenance After Age 6 After age 22, you are at a higher risk for certain long-term diseases and infections as well as injuries from falls. Falls are a major cause of broken bones and head injuries in people who are older than age 44. Getting regular preventive care can help to keep you healthy and well. Preventive care includes getting regular testing and making lifestyle changes as recommended by your health care provider. Talk with your health care provider about:  Which screenings and tests you should have. A screening is a test that checks for a disease when you have no symptoms.  A diet and exercise plan that is right for you. What should I know about screenings and tests to prevent falls? Screening and testing are the best ways to find a health problem early. Early diagnosis and treatment give you the best chance of managing medical conditions that are common after age 51. Certain conditions and lifestyle choices may make you more likely to have a fall. Your health care provider may recommend:  Regular vision checks. Poor vision and conditions such as cataracts can make you more likely to have a fall. If you wear glasses, make sure to get your prescription updated if your vision changes.  Medicine review. Work with your health care provider to regularly review all of the medicines you are taking, including  over-the-counter medicines. Ask your health care provider about any side effects that may make you more likely to have a fall. Tell your health care provider if any medicines that you take make you feel dizzy or sleepy.  Osteoporosis screening. Osteoporosis is a condition that causes the bones to get weaker. This can make the bones weak and cause them to break more easily.  Blood pressure screening. Blood pressure changes and medicines to control blood pressure can make you feel dizzy.  Strength and balance checks. Your health care provider may recommend certain tests to check your strength and balance while standing, walking, or changing positions.  Foot health exam. Foot pain and numbness, as well as not wearing proper footwear, can make you more likely to have a fall.  Depression screening. You may be more likely to have a fall if you have a fear of falling, feel emotionally low, or feel unable to do activities that you used to do.  Alcohol use screening. Using too much alcohol can affect your balance and may make you more likely to have a fall. What actions can I take to lower my risk of falls? General instructions  Talk with your health care provider about your risks for falling. Tell your health care provider if: ? You fall. Be sure to tell your health care provider about all falls, even ones that seem minor. ? You feel dizzy, sleepy, or off-balance.  Take over-the-counter and prescription medicines only as told  by your health care provider. These include any supplements.  Eat a healthy diet and maintain a healthy weight. A healthy diet includes low-fat dairy products, low-fat (lean) meats, and fiber from whole grains, beans, and lots of fruits and vegetables. Home safety  Remove any tripping hazards, such as rugs, cords, and clutter.  Install safety equipment such as grab bars in bathrooms and safety rails on stairs.  Keep rooms and walkways well-lit. Activity   Follow a  regular exercise program to stay fit. This will help you maintain your balance. Ask your health care provider what types of exercise are appropriate for you.  If you need a cane or walker, use it as recommended by your health care provider.  Wear supportive shoes that have nonskid soles. Lifestyle  Do not drink alcohol if your health care provider tells you not to drink.  If you drink alcohol, limit how much you have: ? 0-1 drink a day for women. ? 0-2 drinks a day for men.  Be aware of how much alcohol is in your drink. In the [place] one drink equals one typical bottle of beer (12 oz), one-half glass of wine (5 oz), or one shot of hard liquor (1 oz).  Do not use any products that contain nicotine or tobacco, such as cigarettes and e-cigarettes. If you need help quitting, ask your health care provider. Summary  Having a healthy lifestyle and getting preventive care can help to protect your health and wellness after age 33.  Screening and testing are the best way to find a health problem early and help you avoid having a fall. Early diagnosis and treatment give you the best chance for managing medical conditions that are more common for people who are older than age 48.  Falls are a major cause of broken bones and head injuries in people who are older than age 68. Take precautions to prevent a fall at home.  Work with your health care provider to learn what changes you can make to improve your health and wellness and to prevent falls. This information is not intended to replace advice given to you by your health care provider. Make sure you discuss any questions you have with your health care provider. Document Revised: 02/10/2019 Document Reviewed: 09/02/2017 Elsevier Patient Education  2020 [employer].

## 2020-10-02 NOTE — Progress Notes (Signed)
Patient ID: Katherine Oliver, female    DOB: 02-25-1946, 74 y.o.   MRN: [id number]  This visit was conducted in person.  BP 140/80 (BP Location: Right Arm, Patient Position: Sitting, Cuff Size: Normal)   Pulse 70   Temp 97.6 F (36.4 C) (Temporal)   Ht 5' 0.5" (1.537 m)   Wt 137 lb 3 oz (62.2 kg)   SpO2 97%   BMI 26.35 kg/m   BP Readings from Last 3 Encounters:  10/02/20 140/80  07/02/20 128/84  06/13/20 (!) 178/108    CC: CPE Subjective:   HPI: Katherine Oliver is a 74 y.o. female presenting on 10/02/2020 for Annual Exam (Prt 2.  Pt accompanied by husband, [name]- temp 97.8.)   Saw health advisor last week for medicare wellness visit. Note reviewed.   No exam data present    Clinical Support from 09/25/2020 in [place] at [facility] Total Score 0      Fall Risk  09/25/2020 05/10/2020 08/23/2019 09/21/2018 08/16/2018  Falls in the past year? 1 1 0 0 No  Comment - - - Emmi Telephone Survey: data to providers prior to load -  Number falls in past yr: 1 0 0 - -  Injury with Fall? 0 1 0 - -  Risk for fall due to : Medication side effect Impaired balance/gait Medication side effect - -  Follow up Falls evaluation completed;Falls prevention discussed Falls prevention discussed Falls evaluation completed;Falls prevention discussed - -  Comment - Much improved - - -  2 falls - one while at rehab when going up steps (early in the course). Second fall occurred at home, getting out of car tripped on curb. Self limited injury. No premonitory symptoms.   Prolonged hospitalization for complicated necrotizing pancreatitis with residual chronic diarrhea (pos fecal fat, markedly low fecal elastase, C diff returned positive as well, completed 10d oral vanc 05/2020). Diarrhea seems better on creon, intermittently also managed with imodium.   Recurrent UTI - followed by urology now on trimethoprim preventatively. Rash to keflex.   Over last few weeks  bumped L hand at 1st [facility] with residual soreness.   H/o lone afib once during prolonged hospitalization, no trouble since. Asks about need for cardiology appt.  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 - seeing [facility] (Dr Lacinda Axon) insulin recently increased to:  Just before breakfast Humalog 6 units Plus any extra Humalog from the Correction Scale, if needed  Just before lunch Humalog 6 units Plus any extra Humalog from the Correction Scale, if needed  Just before dinner Humalog 6 units Plus any extra Humalog from the Correction Scale, if needed  Bedtime (take at the same time every day) Lantus (glargine) 18 units Do NOT add any extra insulin at bedtime  Correction Scale for Extra Insulin  When to Take Blood Sugar Additional Units Humalog  Just before each meal  70 - 200 NO extra Humalog  201 - 250 Take 1 extra units Humalog  251 - 300 Take 2 extra units Humalog  301 - 350 Take 3 extra units Humalog  351 - 400 Take 4 extra units Humalog  Greater than 400 Take 5 extra units Humalog and call your provider    Preventative: COLONOSCOPY WITH PROPOFOL 02/05/2018-4 TA, SSA, diverticulosis, rpt 3 yrs [name], [name], MD)  Clotilde Dieter Minus Breeding 08/2020 at [facility]  Pap smear - stopped around 74yo, s/p hysterectomy for heavy bleeding. Both ovaries remain. Declines continued pelvic exam. Dexa Date: 06/2013 [facility] -  Tscore -1.1 forearm, normal spine and femur - will repeat this year.  DEXA 09/2019: T -0.3 spine, -1.3 L hip.  Per endo needs repeated - they wanted PCP to order. No mention in their note about this.  Flu shot yearly [place] 11/2019, 12/2019. They wonder about autoimmune reaction to [place] leading to pancreas issue. She doesn't want to get booster for this reason.  Pneumovax 05/2012, prevnar 2015 Td 2006 Zostavax2010 Shingrix - discussed - to consider  Advanced directives: husband [name] is [place]. DPOA in chart (05/2014). Has new one at home that she needs to complete - will bring [place]  copy when complete.  Seat belt use discussed.  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 Diet: ok water intake 4 glasses/day, daily fruits/vegetables, red meat 4x/wk, fish 3-4x/wk     Relevant past medical, surgical, family and social history reviewed and updated as indicated. Interim medical history since our last visit reviewed. Allergies and medications reviewed and updated. Outpatient Medications Prior to Visit  Medication Sig Dispense Refill  . ACCU-CHEK AVIVA PLUS test strip USE AS DIRECTED TO CHECK BLOOD SUGARS ONCE DAILY E11.21 100 strip 7  . acetaminophen (TYLENOL) 325 MG tablet Take 650 mg by mouth every 6 (six) hours as needed for mild pain.    . benazepril (LOTENSIN) 10 MG tablet Take 1 tablet (10 mg total) by mouth daily. 90 tablet 3  . conjugated estrogens (PREMARIN) vaginal cream Place 1 Applicatorful vaginally daily. Only to be applied topically 42.5 g 12  . Continuous Blood Gluc Transmit (DEXCOM G6 TRANSMITTER) MISC Dispense Dexcom G6 Transmitter    . CREON 36000-114000 units CPEP capsule SMARTSIG:Capsule(s) By Mouth As Directed    . ferrous sulfate 325 (65 FE) MG tablet Take 1 tablet (325 mg total) by mouth daily with breakfast. 90 tablet 3  . folic acid (FOLVITE) 1 MG tablet Take 1 tablet (1 mg total) by mouth daily. 90 tablet 3  . insulin lispro (HUMALOG) 100 UNIT/ML injection Inject 0.06 mLs (6 Units total) into the skin 3 (three) times daily before meals. (according to sliding scale - max 36u daily)    . LANTUS SOLOSTAR 100 UNIT/ML Solostar Pen Inject 18 Units into  the skin at bedtime.    Marland Kitchen loperamide (IMODIUM A-D) 2 MG tablet Take 2 mg by mouth 4 (four) times daily as needed for diarrhea or loose stools.    . meclizine (ANTIVERT) 25 MG tablet Take 1 tablet (25 mg total) by mouth 3 (three) times daily as needed for nausea. 30 tablet 0  . metoprolol succinate (TOPROL XL) 25 MG 24 hr tablet Take 1 tablet (25 mg total) by mouth daily. 90 tablet 3  . Multiple Vitamin (MULTIVITAMIN) tablet 1 tablet by Per J Tube route daily.     . pantoprazole (PROTONIX) 40 MG tablet Take 1 tablet (40 mg total) by mouth daily. 90 tablet 3  . trimethoprim (TRIMPEX) 100 MG tablet Take 1 tablet (100 mg total) by mouth daily. 30 tablet 6  . hydrochlorothiazide (MICROZIDE) 12.5 MG capsule Take 1 capsule (12.5 mg total) by mouth daily.    Marland Kitchen  insulin lispro (HUMALOG) 100 UNIT/ML injection Inject 4-14 Units into the skin 3 (three) times daily before meals. (according to sliding scale - max 36u daily)     . LANTUS SOLOSTAR 100 UNIT/ML Solostar Pen Inject 14 Units into the skin at bedtime.      No facility-administered medications prior to visit.     Per HPI unless specifically indicated in ROS section below Review of Systems Objective:  BP 140/80 (BP Location: Right Arm, Patient Position: Sitting, Cuff Size: Normal)   Pulse 70   Temp 97.6 F (36.4 C) (Temporal)   Ht 5' 0.5" (1.537 m)   Wt 137 lb 3 oz (62.2 kg)   SpO2 97%   BMI 26.35 kg/m   Wt Readings from Last 3 Encounters:  10/02/20 137 lb 3 oz (62.2 kg)  07/02/20 138 lb 1 oz (62.6 kg)  06/13/20 133 lb (60.3 kg)      Physical Exam Vitals and nursing note reviewed.  Constitutional:      General: She is not in acute distress.    Appearance: She is well-developed.  HENT:     Head: Normocephalic and atraumatic.     Right Ear: Hearing, tympanic membrane, ear canal and external ear normal.     Left Ear: Hearing, tympanic membrane, ear canal and external ear normal.     Nose: Nose normal.  Eyes:     General: No scleral  icterus.    Extraocular Movements: Extraocular movements intact.     Conjunctiva/sclera: Conjunctivae normal.     Pupils: Pupils are equal, round, and reactive to light.  Neck:     Thyroid: No thyroid mass or thyromegaly.     Vascular: No carotid bruit.  Cardiovascular:     Rate and Rhythm: Normal rate and regular rhythm.     Pulses: Normal pulses.          Radial pulses are 2+ on the right side and 2+ on the left side.     Heart sounds: Normal heart sounds. No murmur heard.   Pulmonary:     Effort: Pulmonary effort is normal. No respiratory distress.     Breath sounds: Normal breath sounds. No wheezing, rhonchi or rales.  Abdominal:     General: Abdomen is flat. Bowel sounds are normal. There is no distension.     Palpations: Abdomen is soft. There is no mass.     Tenderness: There is no abdominal tenderness. There is no guarding or rebound.     Hernia: No hernia is present.  Musculoskeletal:        General: Tenderness present. Normal range of motion.     Cervical back: Normal range of motion and neck supple.     Right lower leg: No edema.     Left lower leg: No edema.     Comments:  No pain at scaphoids bilaterally  Point tender to palpation of 1st CMC joint on left  FROM at [facility]  Lymphadenopathy:     Cervical: No cervical adenopathy.  Skin:    General: Skin is warm and dry.     Findings: No rash.  Neurological:     General: No focal deficit present.     Mental Status: She is alert and oriented to person, place, and time.     Comments: CN grossly intact, station and gait intact  Psychiatric:        Mood and Affect: Mood normal.        Behavior: Behavior normal.  Thought Content: Thought content normal.        Judgment: Judgment normal.       Results for orders placed or performed in visit on 09/25/20  T4, free  Result Value Ref Range   Free T4 0.78 0.60 - 1.60 ng/dL  TSH  Result Value Ref Range   TSH 3.27 0.35 - 4.50 uIU/mL  IBC panel  Result Value  Ref Range   Iron 40 (L) 42 - 145 ug/dL   Transferrin 209.0 (L) 212.0 - 360.0 mg/dL   Saturation Ratios 13.7 (L) 20.0 - 50.0 %  Ferritin  Result Value Ref Range   Ferritin 198.6 10.0 - 291.0 ng/mL  CBC with Differential/Platelet  Result Value Ref Range   WBC 5.3 4.0 - 10.5 K/uL   RBC 4.90 3.87 - 5.11 Mil/uL   Hemoglobin 12.6 12.0 - 15.0 g/dL   HCT 38.0 36 - 46 %   MCV 77.4 (L) 78.0 - 100.0 fl   MCHC 33.1 30.0 - 36.0 g/dL   RDW 15.2 11.5 - 15.5 %   Platelets 102.0 (L) 150 - 400 K/uL   Neutrophils Relative % 69.9 43 - 77 %   Lymphocytes Relative 22.1 12 - 46 %   Monocytes Relative 7.4 3 - 12 %   Eosinophils Relative 0.0 0 - 5 %   Basophils Relative 0.6 0 - 3 %   Neutro Abs 3.7 1.4 - 7.7 K/uL   Lymphs Abs 1.2 0.7 - 4.0 K/uL   Monocytes Absolute 0.4 0.1 - 1.0 K/uL   Eosinophils Absolute 0.0 0.0 - 0.7 K/uL   Basophils Absolute 0.0 0.0 - 0.1 K/uL  VITAMIN D 25 Hydroxy (Vit-D Deficiency, Fractures)  Result Value Ref Range   VITD 28.92 (L) 30.00 - 100.00 ng/mL  Hemoglobin A1c  Result Value Ref Range   Hgb A1c MFr Bld 8.9 (H) 4.6 - 6.5 %  Comprehensive metabolic panel  Result Value Ref Range   Sodium 138 135 - 145 mEq/L   Potassium 4.8 3.5 - 5.1 mEq/L   Chloride 104 96 - 112 mEq/L   CO2 26 19 - 32 mEq/L   Glucose, Bld 198 (H) 70 - 99 mg/dL   BUN 38 (H) 6 - 23 mg/dL   Creatinine, Ser 1.50 (H) 0.40 - 1.20 mg/dL   Total Bilirubin 0.6 0.2 - 1.2 mg/dL   Alkaline Phosphatase 104 39 - 117 U/L   AST 12 0 - 37 U/L   ALT 12 0 - 35 U/L   Total Protein 7.1 6.0 - 8.3 g/dL   Albumin 4.5 3.5 - 5.2 g/dL   GFR 33.73 (L) >60.00 mL/min   Calcium 9.9 8.4 - 10.5 mg/dL  Lipid panel  Result Value Ref Range   Cholesterol 139 0 - 200 mg/dL   Triglycerides 199.0 (H) 0 - 149 mg/dL   HDL 45.30 >39.00 mg/dL   VLDL 39.8 0.0 - 40.0 mg/dL   LDL Cholesterol 54 0 - 99 mg/dL   Total CHOL/HDL Ratio 3    NonHDL 93.52   No results found for: ANA   Assessment & Plan:  This visit occurred during the  SARS-CoV-2 public health emergency.  Safety protocols were in place, including screening questions prior to the visit, additional usage of staff PPE, and extensive cleaning of exam room while observing appropriate contact time as indicated for disinfecting solutions.   Problem List Items Addressed This Visit    Uncontrolled type 2 diabetes mellitus with complication, with long-term current use of insulin ([place])  Followed by [facility] - insulin dosing is being adjusted.       Relevant Medications   insulin lispro (HUMALOG) 100 UNIT/ML injection   LANTUS SOLOSTAR 100 UNIT/ML Solostar Pen   rosuvastatin (CRESTOR) 5 MG tablet   Presence of IVC filter    Filter remains in place.       Pain involving joint of finger of left hand    L 1st CMC joint pain after bumping hand x2. Not consistent with CMC fracture or scaphoid fracture. rec supportive care at this time including voltaren topically.       RESOLVED: Ovarian mass, right   Osteopenia    Last DEXA 09/2019.  Update DEXA in setting of complicated year with recurrent hospitalizations with chronic necrotizing pancreatitis and h/o protein cal malnutrition.       Relevant Orders   DG Bone Density   Necrotizing pancreatitis    With resultant pancreatic insufficiency now on creon      Relevant Orders   DG Bone Density   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HCC)    Isolated episode during one of her hospitalizations this year, without recurrence. Not on anticoagulant. Will monitor for now, with cards referral if recurrent symptoms.       Relevant Medications   rosuvastatin (CRESTOR) 5 MG tablet   LAFB (left anterior fascicular block)    Not present on rpt EKG 04/2020      Relevant Medications   rosuvastatin (CRESTOR) 5 MG tablet   History of recurrent UTIs    Continues tmp 100mg  preventatively.       GERD (gastroesophageal reflux disease)    Continues daily protonix.       Dyslipidemia associated with type 2 diabetes mellitus ([place])     Back on crestor 5mg  daily - seems to be tolerating well.       Relevant Medications   insulin lispro (HUMALOG) 100 UNIT/ML injection   LANTUS SOLOSTAR 100 UNIT/ML Solostar Pen   rosuvastatin (CRESTOR) 5 MG tablet   CKD stage 3 due to type 2 diabetes mellitus ([place])    Acute deterioration of CKD noted with concomitant rise in BUN, unclear cause.  Will stop HCTZ, encourage increased water intake (in chronic diarrhea) and recheck levels in 2 wks. Monitor on daily TMP.       Relevant Medications   insulin lispro (HUMALOG) 100 UNIT/ML injection   LANTUS SOLOSTAR 100 UNIT/ML Solostar Pen   rosuvastatin (CRESTOR) 5 MG tablet   Other Relevant Orders   Renal function panel   Parathyroid hormone, intact (no Ca)   Chronic diarrhea    Due to pancreatic insufficiency on Creon.       Benign hypertension    Chronic, stable on current regimen. She added hctz due to elevated readings. See below for plan.       Relevant Medications   rosuvastatin (CRESTOR) 5 MG tablet   Aortic atherosclerosis ([place])    Continue crestor.       Relevant Medications   rosuvastatin (CRESTOR) 5 MG tablet   Advanced care planning/counseling discussion - Primary    Advanced directives: husband [name] is [place]. DPOA in chart (05/2014). Has new one at home that she needs to complete - will bring [place] copy when complete.           Meds ordered this encounter  Medications  . rosuvastatin (CRESTOR) 5 MG tablet    Sig: Take 1 tablet (5 mg total) by mouth daily.    Dispense:  90 tablet  Refill:  3  . Cholecalciferol (VITAMIN D3) 25 MCG (1000 UT) CAPS    Sig: Take 1 capsule (1,000 Units total) by mouth daily.    Dispense:  30 capsule   Orders Placed This Encounter  Procedures  . DG Bone Density    Standing Status:   Future    Standing Expiration Date:   10/02/2021    Order Specific Question:   Reason for Exam (SYMPTOM  OR DIAGNOSIS REQUIRED)    Answer:   osteopenia f/u    Order Specific Question:   Preferred  imaging location?    Answer:    [facility]  . Renal function panel    Standing Status:   Future    Standing Expiration Date:   10/02/2021  . Parathyroid hormone, intact (no Ca)    Standing Status:   Future    Standing Expiration Date:   10/02/2021    Patient instructions: If interested, check with pharmacy about new 2 shot shingles series (shingrix).  Start vit D 1000 units daily.  Hold hctz and increase water, recheck kidney function in 2 weeks. Monitor blood pressures, let us know if consistently >140/90.  Bring [place] a copy of living will when completed to update your chart.  I will order DEXA scan - check with endocrinology about latest one.  Try diclofenac (voltaren) gel to left thumb joint. Let usk now if not better with this.  Good to see you today  Return as needed or in 3 months for follow up visit.  Return in 2 weeks for lab visit only.   Follow up plan: Return in about 3 months (around 12/31/2020) for follow up visit.  [name], MD

## 2020-10-02 NOTE — Assessment & Plan Note (Signed)
Filter remains in place.

## 2020-10-02 NOTE — Assessment & Plan Note (Signed)
Followed by [facility] - insulin dosing is being adjusted.

## 2020-10-02 NOTE — Assessment & Plan Note (Addendum)
Advanced directives: husband [name] is [place]. DPOA in chart (05/2014). Has new one at home that she needs to complete - will bring [place] copy when complete.

## 2020-10-02 NOTE — Assessment & Plan Note (Signed)
Continues daily protonix.

## 2020-10-02 NOTE — Assessment & Plan Note (Signed)
Due to pancreatic insufficiency on Creon.

## 2020-10-02 NOTE — Assessment & Plan Note (Signed)
Continue crestor 

## 2020-10-02 NOTE — Assessment & Plan Note (Addendum)
Acute deterioration of CKD noted with concomitant rise in BUN, unclear cause.  Will stop HCTZ, encourage increased water intake (in chronic diarrhea) and recheck levels in 2 wks. Monitor on daily TMP.

## 2020-10-02 NOTE — Assessment & Plan Note (Signed)
Continues tmp 100mg  preventatively.

## 2020-10-02 NOTE — Assessment & Plan Note (Signed)
L 1st CMC joint pain after bumping hand x2. Not consistent with CMC fracture or scaphoid fracture. rec supportive care at this time including voltaren topically.

## 2020-10-02 NOTE — Assessment & Plan Note (Signed)
Back on crestor 5mg  daily - seems to be tolerating well.

## 2020-10-02 NOTE — Assessment & Plan Note (Signed)
With resultant pancreatic insufficiency now on creon

## 2020-10-02 NOTE — Assessment & Plan Note (Signed)
Isolated episode during one of her hospitalizations this year, without recurrence. Not on anticoagulant. Will monitor for now, with cards referral if recurrent symptoms.

## 2020-10-02 NOTE — Assessment & Plan Note (Signed)
Last DEXA 09/2019.  Update DEXA in setting of complicated year with recurrent hospitalizations with chronic necrotizing pancreatitis and h/o protein cal malnutrition.

## 2020-10-15 ENCOUNTER — Encounter: Payer: Self-pay | Admitting: Family Medicine

## 2020-10-16 DIAGNOSIS — S36209D Unspecified injury of unspecified part of pancreas, subsequent encounter: Secondary | ICD-10-CM | POA: Diagnosis not present

## 2020-10-16 DIAGNOSIS — E139 Other specified diabetes mellitus without complications: Secondary | ICD-10-CM | POA: Diagnosis not present

## 2020-10-16 DIAGNOSIS — Z794 Long term (current) use of insulin: Secondary | ICD-10-CM | POA: Diagnosis not present

## 2020-10-16 DIAGNOSIS — E782 Mixed hyperlipidemia: Secondary | ICD-10-CM | POA: Diagnosis not present

## 2020-10-16 DIAGNOSIS — E1369 Other specified diabetes mellitus with other specified complication: Secondary | ICD-10-CM | POA: Diagnosis not present

## 2020-10-16 DIAGNOSIS — K8689 Other specified diseases of pancreas: Secondary | ICD-10-CM | POA: Diagnosis not present

## 2020-10-16 DIAGNOSIS — Z79899 Other long term (current) drug therapy: Secondary | ICD-10-CM | POA: Diagnosis not present

## 2020-10-18 ENCOUNTER — Other Ambulatory Visit (INDEPENDENT_AMBULATORY_CARE_PROVIDER_SITE_OTHER): Payer: Medicare Other

## 2020-10-18 ENCOUNTER — Other Ambulatory Visit: Payer: Self-pay

## 2020-10-18 DIAGNOSIS — E1122 Type 2 diabetes mellitus with diabetic chronic kidney disease: Secondary | ICD-10-CM | POA: Diagnosis not present

## 2020-10-18 DIAGNOSIS — N183 Chronic kidney disease, stage 3 unspecified: Secondary | ICD-10-CM

## 2020-10-18 LAB — RENAL FUNCTION PANEL
Albumin: 4.5 g/dL (ref 3.5–5.2)
BUN: 35 mg/dL — ABNORMAL HIGH (ref 6–23)
CO2: 24 mEq/L (ref 19–32)
Calcium: 9.9 mg/dL (ref 8.4–10.5)
Chloride: 108 mEq/L (ref 96–112)
Creatinine, Ser: 1.41 mg/dL — ABNORMAL HIGH (ref 0.40–1.20)
GFR: 36.32 mL/min — ABNORMAL LOW (ref >60.00)
Glucose, Bld: 191 mg/dL — ABNORMAL HIGH (ref 70–99)
Phosphorus: 4.5 mg/dL (ref 2.3–4.6)
Potassium: 5.1 mEq/L (ref 3.5–5.1)
Sodium: 139 mEq/L (ref 135–145)

## 2020-10-19 LAB — PARATHYROID HORMONE, INTACT (NO CA): PTH: 46 pg/mL (ref 14–64)

## 2020-10-23 ENCOUNTER — Other Ambulatory Visit: Payer: Self-pay | Admitting: *Deleted

## 2020-10-23 DIAGNOSIS — Z794 Long term (current) use of insulin: Secondary | ICD-10-CM | POA: Diagnosis not present

## 2020-10-23 DIAGNOSIS — E1165 Type 2 diabetes mellitus with hyperglycemia: Secondary | ICD-10-CM | POA: Diagnosis not present

## 2020-10-23 NOTE — Patient Outreach (Signed)
[place] [facility]) Care Management  10/23/2020  Nihira Puello [facility] [birth year] [id number]   Telephone Assessment-Unsuccessful  RN attempted outreach call and left a HIPAA approved voice message requesting a call back. Will follow up next week with ongoing complex case management service and inquire further on pt's ongoing management of care.  Will scheduled another outreach and update the provider on pt's disposition with [facility] services.  [name], RN Care Management Coordinator [place] Office [phone]

## 2020-10-25 IMAGING — DX DG KNEE COMPLETE 4+V*R*
4 series · 4 of 4 positions shown · non-contrast
Comparison: None.

CLINICAL DATA: Acute right knee pain.

EXAM:
RIGHT KNEE - COMPLETE 4+ VIEW

[knee ap]
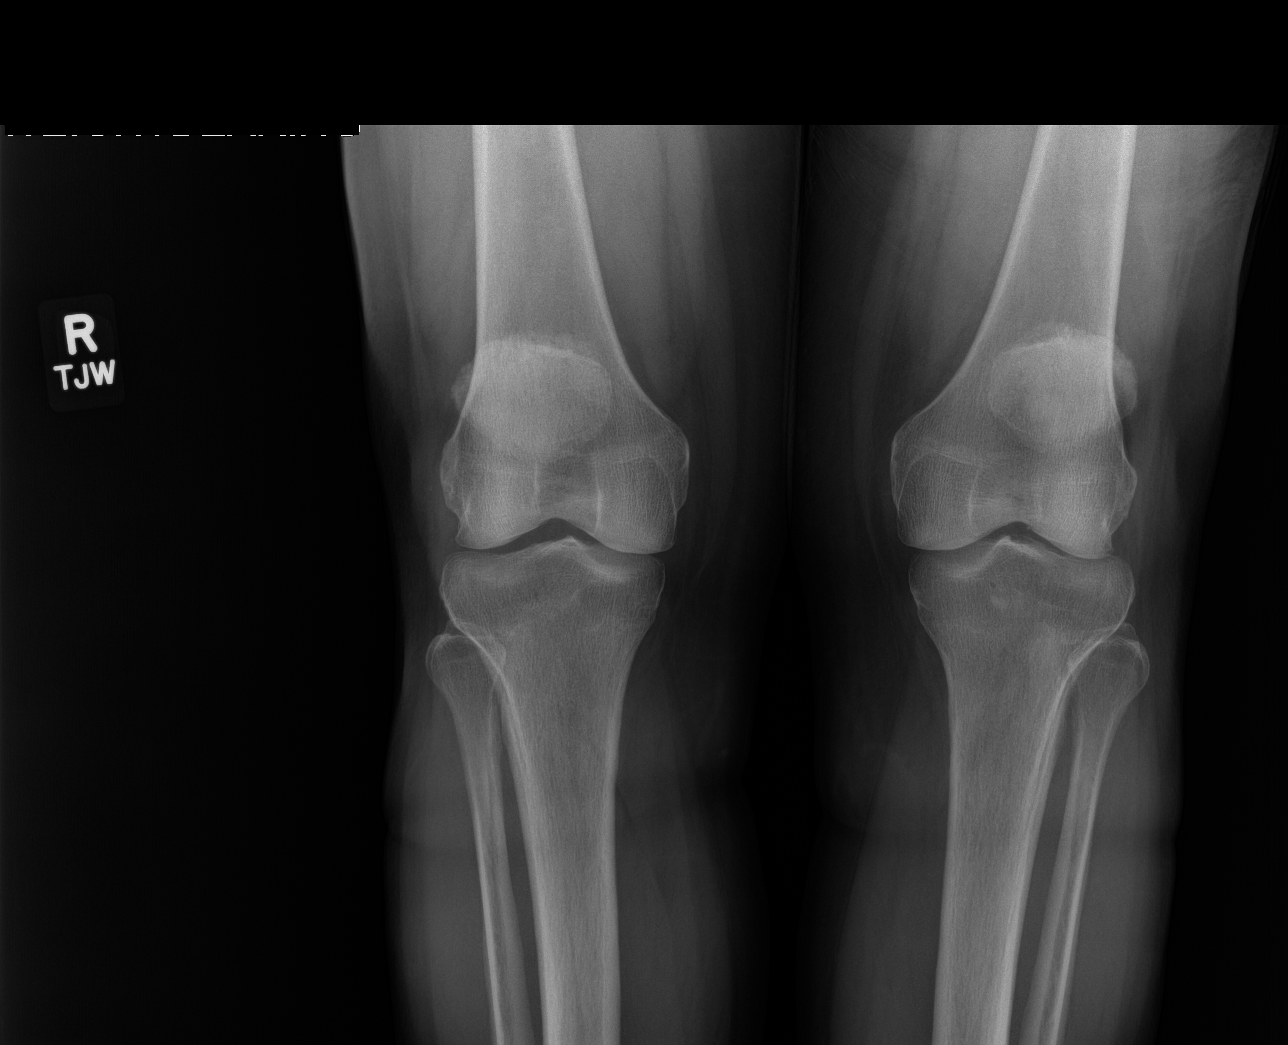

[knee lat]
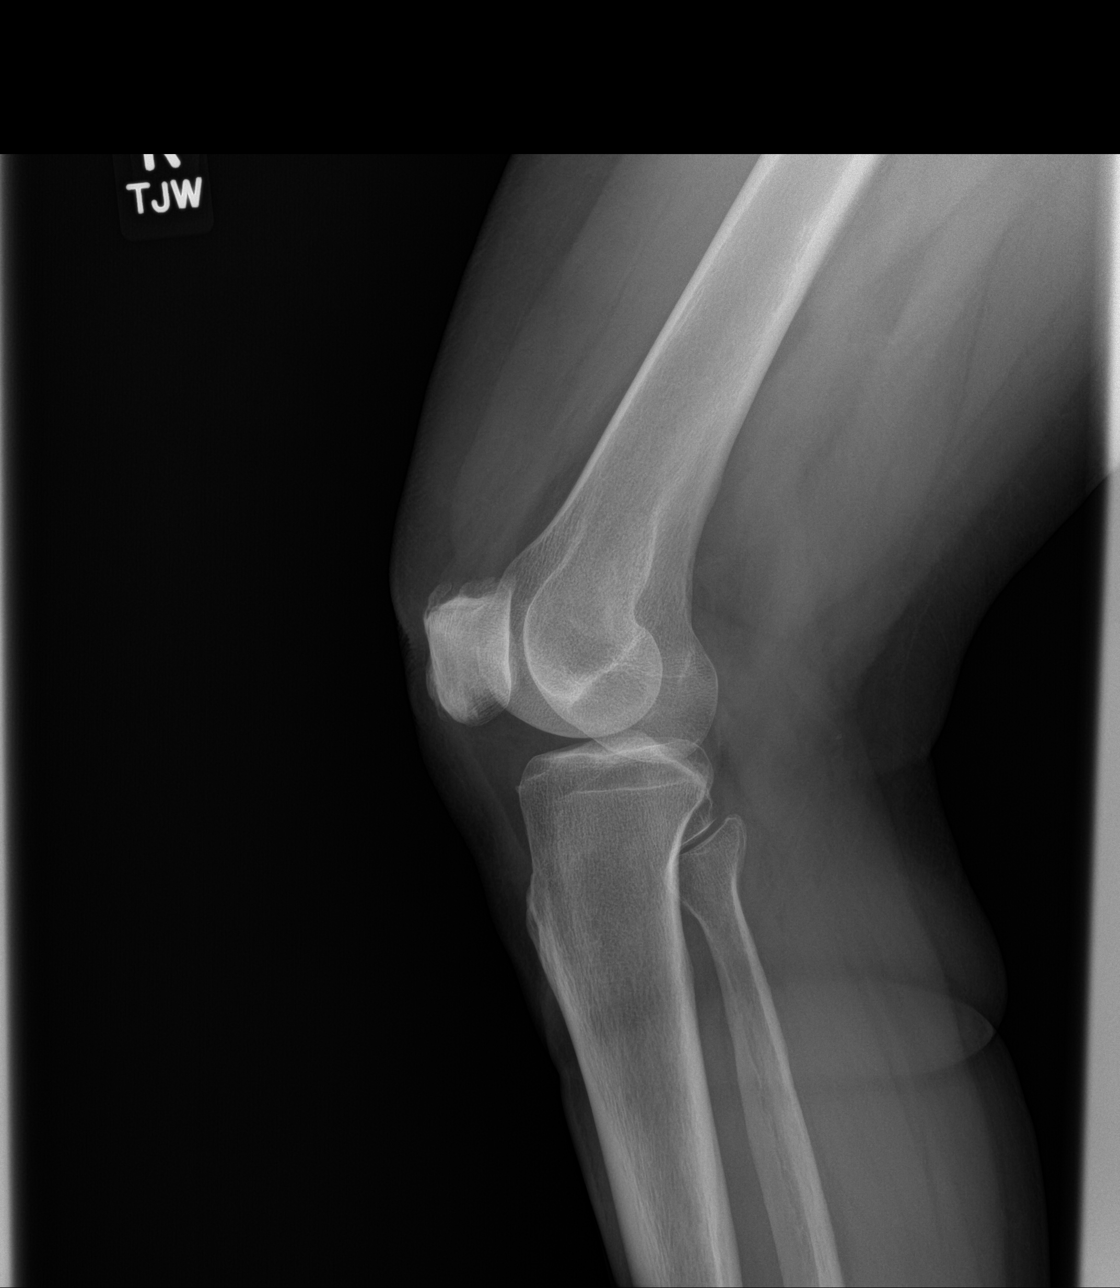

[patella skyline]
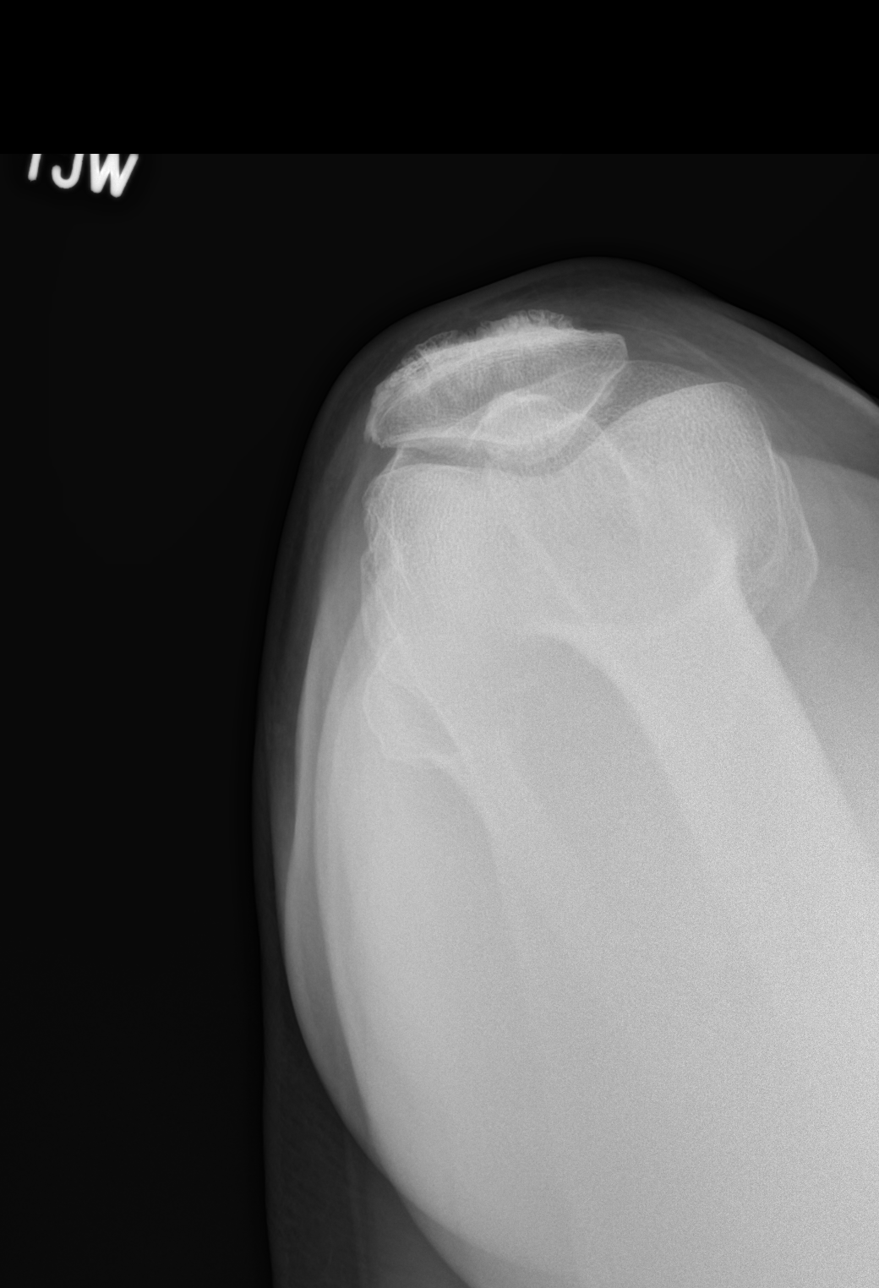

[knee obl]
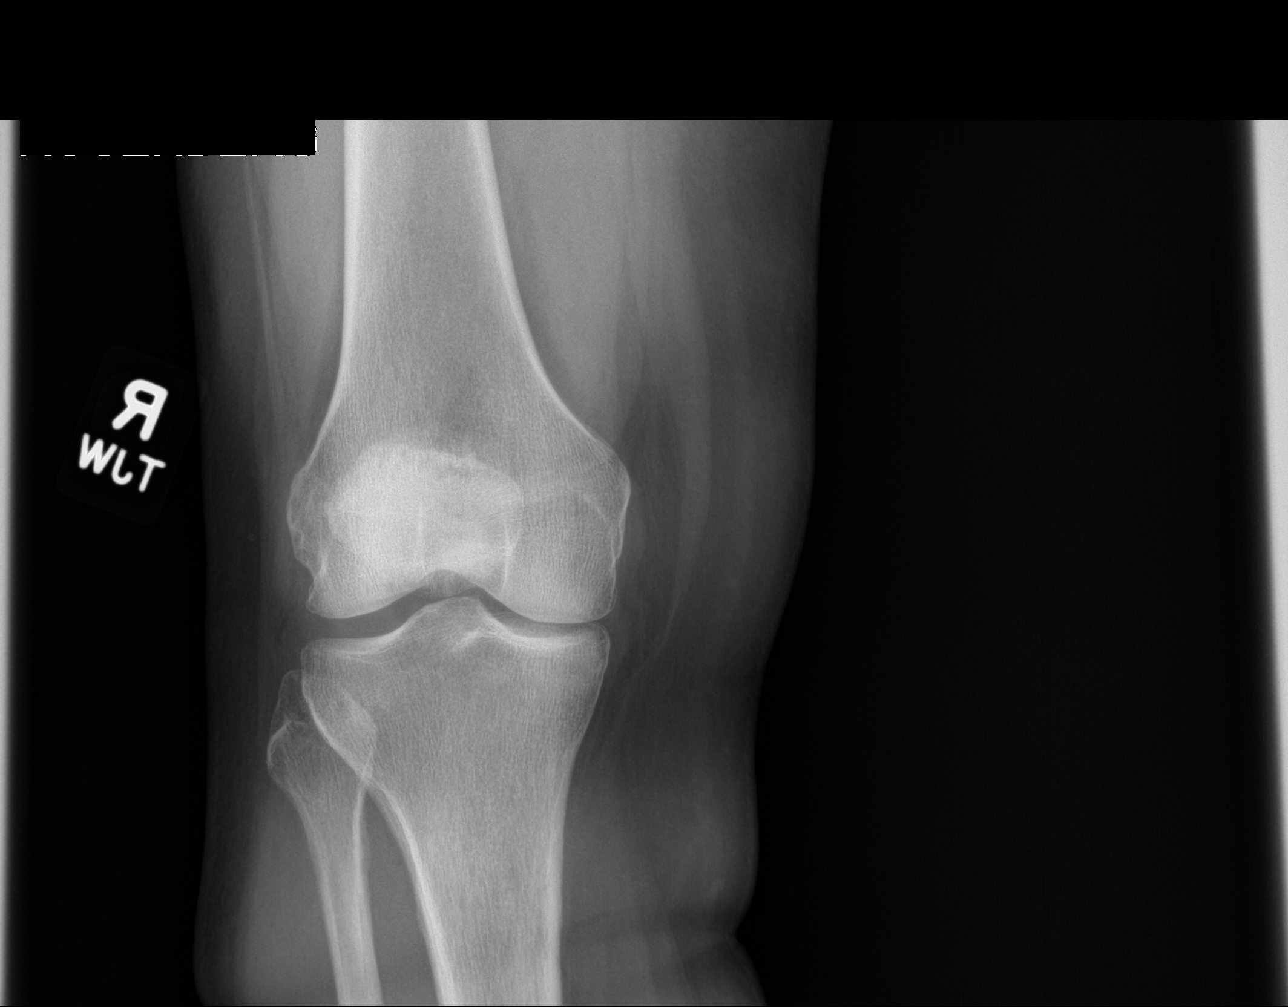

[4 of 4 positions shown; findings below may reference images not displayed]

FINDINGS: No evidence of fracture, dislocation, or joint effusion. Mild
patellar spurring is noted. Mild narrowing of medial joint space is
noted. Soft tissues are unremarkable.
IMPRESSION: Mild degenerative joint disease is noted medially. No acute
abnormality seen in the right knee.

## 2020-10-27 ENCOUNTER — Other Ambulatory Visit: Payer: Self-pay | Admitting: Family Medicine

## 2020-10-27 DIAGNOSIS — N183 Chronic kidney disease, stage 3 unspecified: Secondary | ICD-10-CM

## 2020-10-30 ENCOUNTER — Other Ambulatory Visit: Payer: Self-pay | Admitting: *Deleted

## 2020-10-30 NOTE — Patient Outreach (Signed)
[place] [facility]) Care Management  10/30/2020  Katherine Oliver [facility] 10-25-1946 [id number]   Telephone Assessment-Successful-Diabetes  RN spoke with pt today concerning her ongoing management of care. Pt reports her last A1c 11/30 at 8.9 and her endocrinologist has increased her daily insulin dosage to 8 units 3 X daily. Pt also states she has other parameters to follow if her levels are increased to a certain number. Pt noting all readings and average for her provider to view for any additional changes. Pt states she has been reducing her dietary intake avoid carbohydrates to only two with meals as discussed with her provider.   Plan of care updated with pt's progress and changes. Will reiterated on the importance of managing her care to improve her overall health and regulate her glucose readings. Also discussed the risk involved due to the recent increase in her A1c from Sept reading. Pt verbalize an understanding and indicates she has made several changes in order to improve her readings (medication and diet). Will extend goal to allow adherence with the new medication changes along with her new dietary intake. Will follow up next month with pt's ongoing progress.   Goals Addressed            This Visit's Progress   . THN-Monitor and Manage My Blood Sugar   On track    Follow Up Date: 11/30/2020 Timeframe:  Short-Term Goal Priority:  Medium Start Date:  10/30/2020                     Expected End Date:       12/03/2020                  - check blood sugar at prescribed times - check blood sugar if I feel it is too high or too low - enter blood sugar readings and medication or insulin into daily log - take the blood sugar log to all doctor visits - take the blood sugar meter to all doctor visits    Why is this important?   Checking your blood sugar at home helps to keep it from getting very high or very low.  Writing the results in a diary or log helps the doctor  know how to care for you.  Your blood sugar log should have the time, date and the results.  Also, write down the amount of insulin or other medicine that you take.  Other information, like what you ate, exercise done and how you were feeling, will also be helpful.     Notes:  Pt continue to work with her dietary habits high protein, low carbohydrate. Recent A1c 8.9 Nov treated with increased insulin doses. Reports reading have improved after medication changes ([phone]). Will extend this goal to allow adherence with the new increased insulin dosages.     . THN-Set My Target A1C   On track    Follow Up Date 11/30/2020 Timeframe:  Long-Range Goal Priority:  Medium Start Date:    10/30/2020                   Expected End Date:     01/31/2021                                - set target A1C    Why is this important?   Your target A1C is decided together by you and your doctor.  It is based on several things like your age and other health issues.    Notes: A1C 8.9 09/22/2020-insulin increased with daily glucose readings improved 143-195. Will continue to educate on dietary habits and reducing carbohydrates       Katherine Mina, RN Care Management Coordinator [place] Office [phone]

## 2020-11-13 ENCOUNTER — Other Ambulatory Visit: Payer: Self-pay

## 2020-11-13 ENCOUNTER — Other Ambulatory Visit (INDEPENDENT_AMBULATORY_CARE_PROVIDER_SITE_OTHER): Payer: Medicare Other

## 2020-11-13 DIAGNOSIS — E1122 Type 2 diabetes mellitus with diabetic chronic kidney disease: Secondary | ICD-10-CM

## 2020-11-13 DIAGNOSIS — N183 Chronic kidney disease, stage 3 unspecified: Secondary | ICD-10-CM

## 2020-11-13 LAB — RENAL FUNCTION PANEL
Albumin: 4.8 g/dL (ref 3.5–5.2)
BUN: 29 mg/dL — ABNORMAL HIGH (ref 6–23)
CO2: 28 mEq/L (ref 19–32)
Calcium: 10 mg/dL (ref 8.4–10.5)
Chloride: 105 mEq/L (ref 96–112)
Creatinine, Ser: 1.05 mg/dL (ref 0.40–1.20)
GFR: 51.7 mL/min — ABNORMAL LOW (ref >60.00)
Glucose, Bld: 173 mg/dL — ABNORMAL HIGH (ref 70–99)
Phosphorus: 4.2 mg/dL (ref 2.3–4.6)
Potassium: 4.4 mEq/L (ref 3.5–5.1)
Sodium: 139 mEq/L (ref 135–145)

## 2020-11-28 ENCOUNTER — Telehealth: Payer: Self-pay

## 2020-11-28 MED ORDER — AMLODIPINE BESYLATE 2.5 MG PO TABS: 2.5000 mg | ORAL_TABLET | Freq: Every day | ORAL | 3 refills | Status: DC

## 2020-11-28 NOTE — Telephone Encounter (Signed)
Recommend we add on amlodipine 2.5mg  daily - sent to pharmacy. Keep f/u appt next week.

## 2020-11-28 NOTE — Telephone Encounter (Signed)
Spoke with pt relaying Dr. G's message.  Pt verbalizes understanding and expresses her thanks.  

## 2020-11-28 NOTE — Telephone Encounter (Signed)
I spoke with pt; pt said trimethoprim stopped about 3 wks ago and every day pts BP goes higher; today BP earlier was 180/94 and today at 3 PM BP was 171/93 P 63. No CP,SOB,H/A,dizziness or vision changes. Pt said Endo sometime in Dec 2021 increased Benazepril 20 mg to one daily. Pt to see endo 12/26/20. Pt also taking metoprolol 25 mg taking one daily. Pt wants to know what Dr Darnell Level thinks pt should do. Pt request cb today. Pt said if she did not get cb she would go to UC but pt does want a cb if possible. Sending note to Dr Darnell Level and Lattie Haw CMA. [facility]; UC & ED precautions given and pt voiced understanding.

## 2020-11-28 NOTE — Telephone Encounter (Signed)
[facility] - Client TELEPHONE ADVICE RECORD [facility] Patient Name: Katherine Oliver Gender: Female DOB: July 30, 1946 Age: 75 Y 3 M 18 D Return Phone Number: [phone] (Primary), [phone] (Secondary) Address[place] [address] Client [place] [facility] - Client Client Site [place] Physician [name] - MD Contact Type Call Who Is Calling Patient / Member / Family / Caregiver Call Type Triage / Clinical Relationship To Patient Self Return Phone Number [phone] (Primary) Chief Complaint Blood Pressure High Reason for Call Symptomatic / Request for [place] states the patient is already scheduled an appt. for Tuesday. She has elevated BP. 180/94. Since 1/19 it has been going up. Her doctor took her off trimethoprim due to her kidney functions being affected. She said she wasn't havent any shortness of breath or chest pain. Her heart rate has also increased. She is 75 years old. She isn't having any symptoms of her high BP. Translation No Nurse Assessment Nurse: Raenette Rover, RN, Zella Ball Date/Time (Eastern Time): 11/28/2020 3:27:51 PM Confirm and document reason for call. If symptomatic, describe symptoms. ---Caller states the patient is already scheduled an appt. for Tuesday feb 1st. . She has elevated BP. 180/94. Since 1/19 it has been going up. She said she wasn't haven't any shortness of breath or chest pain. Her heart rate has also increased. 171/93 68. Does the patient have any new or worsening symptoms? ---Yes Will a triage be completed? ---Yes Related visit to physician within the last 2 weeks? ---No Does the PT have any chronic conditions? (i.e. diabetes, asthma, this includes High risk factors for pregnancy, etc.) ---Yes List chronic conditions. ---htn, diabetic Is this a behavioral health or substance abuse call?  ---No Guidelines Guideline Title Affirmed Question Affirmed Notes Nurse Date/Time (Eastern Time) Blood Pressure - High Systolic BP >= 673 OR Diastolic >= 419 Henson, RN, Zella Ball 11/28/2020 3:30:08 PM Disp. Time Eilene Ghazi Time) Disposition Final User 11/28/2020 3:32:44 PM See PCP within 24 Hours Yes Raenette Rover, RN, Zella Ball PLEASE NOTE: All timestamps contained within this report are represented as [place] Standard Time. CONFIDENTIALTY NOTICE: This fax transmission is intended only for the addressee. It contains information that is legally privileged, confidential or otherwise protected from use or disclosure. If you are not the intended recipient, you are strictly prohibited from reviewing, disclosing, copying using or disseminating any of this information or taking any action in reliance on or regarding this information. If you have received this fax in error, please notify us immediately by telephone so that we can arrange for its return to [place]. Phone: [phone], Toll-Free: [phone], Fax: [phone] Page: 2 of 2 Call Id: [id number] [place] Disagree/Comply Comply Caller Understands Yes PreDisposition Did not know what to do Care Advice Given Per Guideline SEE PCP WITHIN 24 HOURS: * Untreated high blood pressure may cause damage to your heart, brain, kidneys, and eyes. * Weakness or numbness of the face, arm or leg on one side of the body occurs * Chest pain or difficulty breathing occurs * Difficulty walking, difficulty talking, or severe headache occurs CALL BACK IF: * You become worse CARE ADVICE given per High Blood Pressure (Adult) guideline. Comments User: Wilson Singer, RN Date/Time Eilene Ghazi Time): 11/28/2020 3:41:10 PM No appt available per the back line. Advised to go to Urgent care and also requested dr office to call the patient back as she is t wanting to do a possible increase in her bp medication. Advised she  must see the dr first. She said if she did not get a call back then she  would go to urgent care. Referrals REFERRED TO PCP OFFICE

## 2020-11-29 ENCOUNTER — Ambulatory Visit
Admission: EM | Admit: 2020-11-29 | Discharge: 2020-11-29 | Disposition: A | Discharge status: Home / Self Care | Payer: Medicare Other

## 2020-11-29 ENCOUNTER — Other Ambulatory Visit: Payer: Self-pay

## 2020-11-29 DIAGNOSIS — I10 Essential (primary) hypertension: Secondary | ICD-10-CM

## 2020-11-29 NOTE — ED Triage Notes (Signed)
Pt reports having hypertension that began 1/19. Range of BP 158/83-180/90.  Denies having any symptoms. sts PCP discontinued an abx and asked pt to begin monitoring BP at home. Pt sts she have been taking BP meds as prescribed.

## 2020-11-29 NOTE — Discharge Instructions (Addendum)
Your blood pressure was high today. Although you are not having any concerning signs and symptoms.  Keep monitoring at home. Speak with your doctor about possibly increasing the amlodipine to 5 mg.  If you develop headache, dizziness, blurred vision, chest pain or shortness of breath please go to the ER.

## 2020-11-30 ENCOUNTER — Other Ambulatory Visit: Payer: Self-pay | Admitting: *Deleted

## 2020-11-30 NOTE — Patient Outreach (Signed)
[facility]) Care Management  11/30/2020  Katherine Oliver [facility] April 10, 1946 [id number]   Telephone Assessment-Successful-Diabetes/HTN  RN spoke with pt today and received an update on pt's ongoing care. Pt has reported an event with HTN due to discontinuing an antibiotic due to encountering renal issues. Pt had a visit with the urgent care and has spoken with the primary provider's office who prescribed blood pressure medications that has helped some but pt will follow up once again directly at the provider's office on Tuesday for the first available. Pt aware to go to the ED if symptoms worsens. Due to the stress of this event pt's glucose levels are slighted elevated but again all improving with the this newly added blood pressures medications Norvasc. Will continue to discussed and review the plan of care and enclosed in this discussion HTN management related to today's communication. Will also verified pt's is aware of what to do if acute symptoms should occur related to the action plan discussed today.   Will verify all pending appointments and follow up in a few months with ongoing case management services and inquired on pt's progress related to the plan of care. Note provided the [facility] RN hotline if needed over the upcoming weekend.  Goals Addressed            This Visit's Progress   . THN-Disease Progression Prevented or Minimized       Follow up Date 12/31/2020 Timeframe:  Short-Term Goal Priority:  High Start Date:     11/30/2020                        Expected End Date:     12/31/2020                  Evidence-based guidance:   Tailor lifestyle advice to individual; review progress regularly; give frequent encouragement and respond positively to incremental successes.   Assess for and promote awareness of worsening disease or development of comorbidity.   Prepare patient for laboratory and diagnostic exams based on risk and presentation.   Prepare patient  for use of pharmacologic therapy that may include diuretic, beta-blocker, beta-blocker/thiazide combination, angiotensin-converting enzyme inhibitor, renin-angiotensin blocker or calcium-channel blocker.   Expect periodic adjustments to pharmacologic therapy; manage side effects.   Promote a healthy diet that includes primarily plant-based foods, such as fruits, vegetables, whole grains, beans and legumes, low-fat dairy and lean meats.    Consider moderate reduction in sodium intake by avoiding the addition of salt to prepared foods and limiting processed meats, canned soup, frozen meals and salty snacks.    Review sources of stress; explore current coping strategies and encourage use of mindfulness, yoga, meditation or exercise to manage stress.   Notes:   1/28-Discussed pt's current disposition with managing both her diabetes and hypertension. Along with the goal and related interventions. Pt with a understanding and the action plan discussed.    . THN-Monitor and Manage My Blood Sugar   On track    Follow Up Date: 12/31/2020 Timeframe:  Short-Term Goal Priority:  Medium Start Date:  10/30/2020                     Expected End Date:       12/31/2020                  - check blood sugar at prescribed times - check blood sugar if I feel it is too  high or too low - enter blood sugar readings and medication or insulin into daily log - take the blood sugar log to all doctor visits - take the blood sugar meter to all doctor visits    Why is this important?   Checking your blood sugar at home helps to keep it from getting very high or very low.  Writing the results in a diary or log helps the doctor know how to care for you.  Your blood sugar log should have the time, date and the results.  Also, write down the amount of insulin or other medicine that you take.  Other information, like what you ate, exercise done and how you were feeling, will also be helpful.     Notes:   1/28-Pt  reports readings from 153-183 as she continues to manage. Repeat A1C in Feb and pt will follow up with her Endocrinologist at that time. Pt verified she has changed her dietary habits to reflect improved readings and will continue to work on managing her diabetes however her focus has been on managing her HTN due to the recent change. Strongly encourage adherence with the ongoing plan of care related. Dec-Pt continue to work with her dietary habits high protein, low carbohydrate. Recent A1c 8.9 Nov treated with increased insulin doses. Reports reading have improved after medication changes (143-195). Will extend this goal to allow adherence with the new increased insulin dosages.     . THN-Set My Target A1C   On track    Follow Up Date 12/31/2020 Timeframe:  Long-Range Goal Priority:  Medium Start Date:    10/30/2020                   Expected End Date:     03/01/2021                               - set target A1C    Why is this important?   Your target A1C is decided together by you and your doctor.  It is based on several things like your age and other health issues.    Notes:  1/28-Pending repeat A1C the last of Feb and follow up with his endocrinologist. Currently reports readings for CBG from 153-183 asymptomatic. Continue to working on improving her A1c  Dec-A1C 8.9 09/22/2020-insulin increased with daily glucose readings improved 143-195. Will continue to educate on dietary habits and reducing carbohydrates       [name], RN Care Management Coordinator [place] Office [phone]

## 2020-12-04 ENCOUNTER — Ambulatory Visit (INDEPENDENT_AMBULATORY_CARE_PROVIDER_SITE_OTHER): Payer: Medicare Other | Admitting: Family Medicine

## 2020-12-04 ENCOUNTER — Encounter: Payer: Self-pay | Admitting: Family Medicine

## 2020-12-04 ENCOUNTER — Other Ambulatory Visit: Payer: Self-pay

## 2020-12-04 VITALS — BP 160/84 | HR 80 | Temp 97.6°F | Ht 61.0 in | Wt 139.0 lb

## 2020-12-04 DIAGNOSIS — E118 Type 2 diabetes mellitus with unspecified complications: Secondary | ICD-10-CM | POA: Diagnosis not present

## 2020-12-04 DIAGNOSIS — Z794 Long term (current) use of insulin: Secondary | ICD-10-CM

## 2020-12-04 DIAGNOSIS — Z87440 Personal history of urinary (tract) infections: Secondary | ICD-10-CM

## 2020-12-04 DIAGNOSIS — E1165 Type 2 diabetes mellitus with hyperglycemia: Secondary | ICD-10-CM | POA: Diagnosis not present

## 2020-12-04 DIAGNOSIS — I10 Essential (primary) hypertension: Secondary | ICD-10-CM | POA: Diagnosis not present

## 2020-12-04 DIAGNOSIS — IMO0002 Reserved for concepts with insufficient information to code with codable children: Secondary | ICD-10-CM

## 2020-12-04 MED ORDER — METOPROLOL SUCCINATE ER 50 MG PO TB24: 50.0000 mg | ORAL_TABLET | Freq: Every day | ORAL | 1 refills | Status: DC

## 2020-12-04 MED ORDER — AMLODIPINE BESYLATE 10 MG PO TABS: 10.0000 mg | ORAL_TABLET | Freq: Every day | ORAL | 1 refills | Status: DC

## 2020-12-04 MED ORDER — BENAZEPRIL HCL 20 MG PO TABS
20.0000 mg | ORAL_TABLET | Freq: Every day | ORAL | 1 refills | Status: DC
Start: 2020-12-04 — End: 2021-04-03

## 2020-12-04 NOTE — Assessment & Plan Note (Addendum)
Kidney function improved off ppx tmp 100mg 

## 2020-12-04 NOTE — Assessment & Plan Note (Signed)
Marked elevation in BP, unclear cause.  Recent labs reassuring.  No significant sodium intake.  Will continue titrating antihypertensives - to metoprolol succinate 50mg  daily, benazepril 20mg  daily, amlodipine 10mg  nightly monitoring for ankle edema.  RTC 1 mo HTN f/u, drop off BP log in 2 wks.  Seek urgent care if hypertensive symptoms develop.  Pt agrees with plan.

## 2020-12-04 NOTE — Assessment & Plan Note (Signed)
Letter written for insurance consideration to cover CGM as patient is eligible.

## 2020-12-04 NOTE — Progress Notes (Signed)
Patient ID: Katherine Oliver, female    DOB: 1945-11-06, 75 y.o.   MRN: [id number]  This visit was conducted in person.  BP (!) 160/84 (BP Location: Right Arm, Patient Position: Sitting, Cuff Size: Normal)   Pulse 80   Temp 97.6 F (36.4 C) (Temporal)   Ht 5\' 1"  (1.549 m)   Wt 139 lb (63 kg)   SpO2 96%   BMI 26.26 kg/m   Home BP cuff - 186/103, again 197/106.  On my recheck 200/90.  CC: 3 mo HTN f/u visit  Subjective:   HPI: Katherine Oliver is a 75 y.o. female presenting on 12/04/2020 for Hypertension (Here for 3 mo f/u. Pt provided log of recent BP readings.  Also, pt brought in home BP monitor to compare.  Reading in office today, 186/103.)   HTN - Compliant with current antihypertensive regimen of benazepril 20mg  daily, amlodipine 5mg  daily, Toprol XL 25mg  daily. She already took BP meds today. Does check blood pressures at home and brings log: 140-180/70-80s, checking several times daily. It seems home BP cuff runs 20 points high both systolic and diastolic. No low blood pressure readings or symptoms of dizziness/syncope. Denies vision changes, CP/tightness, SOB, leg swelling. Occasional headache described as frontal throbbing ache.   Denies increased salt/sodium in diet, she actually limits this. Drinking plenty of water.   BP Readings from Last 3 Encounters:  12/04/20 (!) 160/84  11/29/20 (!) 186/89  10/02/20 140/80   Pulse Readings from Last 3 Encounters:  12/04/20 80  11/29/20 83  10/02/20 70   Kidney function did improve off tmp.   Still has not been able to get dexcom CGM - despite being eligible.  Insurance is also no longer covering lancets and test strips.  Sees [facility] endo regularly.  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      Relevant past medical, surgical, family and social history reviewed and updated as indicated. Interim medical history since our last visit  reviewed. Allergies and medications reviewed and updated. Outpatient Medications Prior to Visit  Medication Sig Dispense Refill  . ACCU-CHEK AVIVA PLUS test strip USE AS DIRECTED TO CHECK BLOOD SUGARS ONCE DAILY E11.21 100 strip 7  . acetaminophen (TYLENOL) 325 MG tablet Take 650 mg by mouth every 6 (six) hours as needed for mild pain.    . Cholecalciferol (VITAMIN D3) 25 MCG (1000 UT) CAPS Take 1 capsule (1,000 Units total) by mouth daily. 30 capsule   . conjugated estrogens (PREMARIN) vaginal cream Place 1 Applicatorful vaginally daily. Only to be applied topically 42.5 g 12  . CREON 36000-114000 units CPEP capsule SMARTSIG:Capsule(s) By Mouth As Directed    . ferrous sulfate 325 (65 FE) MG tablet Take 1 tablet (325 mg total) by mouth daily with breakfast. 90 tablet 3  . folic acid (FOLVITE) 1 MG tablet Take 1 tablet (1 mg total) by mouth daily. 90 tablet 3  . insulin lispro (HUMALOG) 100 UNIT/ML injection Inject 8 Units into the skin 3 (three) times daily before meals.    Marland Kitchen LANTUS SOLOSTAR 100 UNIT/ML Solostar Pen Inject 18 Units into the skin at bedtime.    Marland Kitchen loperamide (IMODIUM A-D) 2 MG tablet Take 2 mg by mouth 4 (four) times daily as needed for diarrhea or loose stools.    . meclizine (ANTIVERT) 25 MG tablet Take 1 tablet (25 mg total) by mouth 3 (three)  times daily as needed for nausea. 30 tablet 0  . Multiple Vitamin (MULTIVITAMIN) tablet 1 tablet by Per J Tube route daily.     . pantoprazole (PROTONIX) 40 MG tablet Take 1 tablet (40 mg total) by mouth daily. 90 tablet 3  . rosuvastatin (CRESTOR) 5 MG tablet Take 1 tablet (5 mg total) by mouth daily. 90 tablet 3  . amLODipine (NORVASC) 2.5 MG tablet Take 1 tablet (2.5 mg total) by mouth daily. (Patient taking differently: Take 2.5 mg by mouth daily. Taking 2 tablets daily) 30 tablet 3  . benazepril (LOTENSIN) 20 MG tablet Take 20 mg by mouth daily.    . metoprolol succinate (TOPROL XL) 25 MG 24 hr tablet Take 1 tablet (25 mg total) by  mouth daily. 90 tablet 3  . Continuous Blood Gluc Transmit (DEXCOM G6 TRANSMITTER) MISC Dispense Dexcom G6 Transmitter (Patient not taking: No sig reported)    . benazepril (LOTENSIN) 10 MG tablet Take 2 tablets (20 mg total) by mouth daily. (Patient not taking: No sig reported)    . trimethoprim (TRIMPEX) 100 MG tablet Take 1 tablet (100 mg total) by mouth daily. (Patient not taking: Reported on 11/30/2020) 30 tablet 6   No facility-administered medications prior to visit.     Per HPI unless specifically indicated in ROS section below Review of Systems Objective:  BP (!) 160/84 (BP Location: Right Arm, Patient Position: Sitting, Cuff Size: Normal)   Pulse 80   Temp 97.6 F (36.4 C) (Temporal)   Ht 5\' 1"  (1.549 m)   Wt 139 lb (63 kg)   SpO2 96%   BMI 26.26 kg/m   Wt Readings from Last 3 Encounters:  12/04/20 139 lb (63 kg)  11/29/20 138 lb (62.6 kg)  10/02/20 137 lb 3 oz (62.2 kg)      Physical Exam Vitals and nursing note reviewed.  Constitutional:      Appearance: Normal appearance. She is not ill-appearing.  Cardiovascular:     Rate and Rhythm: Normal rate and regular rhythm.     Pulses: Normal pulses.     Heart sounds: Normal heart sounds. No murmur heard.   Pulmonary:     Effort: Pulmonary effort is normal. No respiratory distress.     Breath sounds: Normal breath sounds. No wheezing, rhonchi or rales.  Musculoskeletal:        General: Normal range of motion.     Right lower leg: No edema.     Left lower leg: No edema.  Skin:    General: Skin is warm and dry.     Findings: No rash.  Neurological:     Mental Status: She is alert.  Psychiatric:        Mood and Affect: Mood normal.        Behavior: Behavior normal.       Results for orders placed or performed in visit on 11/13/20  Renal function panel  Result Value Ref Range   Sodium 139 135 - 145 mEq/L   Potassium 4.4 3.5 - 5.1 mEq/L   Chloride 105 96 - 112 mEq/L   CO2 28 19 - 32 mEq/L   Albumin 4.8 3.5  - 5.2 g/dL   BUN 29 (H) 6 - 23 mg/dL   Creatinine, Ser 1.05 0.40 - 1.20 mg/dL   Glucose, Bld 173 (H) 70 - 99 mg/dL   Phosphorus 4.2 2.3 - 4.6 mg/dL   GFR 51.70 (L) >60.00 mL/min   Calcium 10.0 8.4 - 10.5 mg/dL  Assessment & Plan:  This visit occurred during the SARS-CoV-2 public health emergency.  Safety protocols were in place, including screening questions prior to the visit, additional usage of staff PPE, and extensive cleaning of exam room while observing appropriate contact time as indicated for disinfecting solutions.   Problem List Items Addressed This Visit    Uncontrolled type 2 diabetes mellitus with complication, with long-term current use of insulin ([place])    Letter written for insurance consideration to cover CGM as patient is eligible.       Relevant Medications   benazepril (LOTENSIN) 20 MG tablet   History of recurrent UTIs    Kidney function improved off ppx tmp 100mg       Accelerated hypertension - Primary    Marked elevation in BP, unclear cause.  Recent labs reassuring.  No significant sodium intake.  Will continue titrating antihypertensives - to metoprolol succinate 50mg  daily, benazepril 20mg  daily, amlodipine 10mg  nightly monitoring for ankle edema.  RTC 1 mo HTN f/u, drop off BP log in 2 wks.  Seek urgent care if hypertensive symptoms develop.  Pt agrees with plan.       Relevant Medications   benazepril (LOTENSIN) 20 MG tablet   amLODipine (NORVASC) 10 MG tablet   metoprolol succinate (TOPROL XL) 50 MG 24 hr tablet       Meds ordered this encounter  Medications  . benazepril (LOTENSIN) 20 MG tablet    Sig: Take 1 tablet (20 mg total) by mouth daily.    Dispense:  90 tablet    Refill:  1  . amLODipine (NORVASC) 10 MG tablet    Sig: Take 1 tablet (10 mg total) by mouth daily.    Dispense:  90 tablet    Refill:  1    Note new sig  . metoprolol succinate (TOPROL XL) 50 MG 24 hr tablet    Sig: Take 1 tablet (50 mg total) by mouth daily.     Dispense:  90 tablet    Refill:  1    Note new dose   No orders of the defined types were placed in this encounter.   Patient instructions: Letter provided today for continuous glucose meter - hopefully this will help.  For blood pressure - increase metoprolol to 50mg  once daily, increase amlodipine to 10mg  daily, watch for ankle swelling.  Take amlodipine at night time. Bring me log of blood pressures in another 2 weeks.  Return in 1 month for follow up visit on hypertension.   Follow up plan: Return in about 4 weeks (around 01/01/2021), or if symptoms worsen or fail to improve, for follow up visit.  [name], MD

## 2020-12-04 NOTE — Patient Instructions (Addendum)
Letter provided today for continuous glucose meter - hopefully this will help.  For blood pressure - increase metoprolol to 50mg  once daily, increase amlodipine to 10mg  daily, watch for ankle swelling.  Take amlodipine at night time. Bring me log of blood pressures in another 2 weeks.  Return in 1 month for follow up visit on hypertension.   [email or web address]">  DASH Eating Plan DASH stands for Dietary Approaches to Stop Hypertension. The DASH eating plan is a healthy eating plan that has been shown to:  Reduce high blood pressure (hypertension).  Reduce your risk for type 2 diabetes, heart disease, and stroke.  Help with weight loss. What are tips for following this plan? Reading food labels  Check food labels for the amount of salt (sodium) per serving. Choose foods with less than 5 percent of the Daily Value of sodium. Generally, foods with less than 300 milligrams (mg) of sodium per serving fit into this eating plan.  To find whole grains, look for the word "whole" as the first word in the ingredient list. Shopping  Buy products labeled as "low-sodium" or "no salt added."  Buy fresh foods. Avoid canned foods and pre-made or frozen meals. Cooking  Avoid adding salt when cooking. Use salt-free seasonings or herbs instead of table salt or sea salt. Check with your health care provider or pharmacist before using salt substitutes.  Do not fry foods. Cook foods using healthy methods such as baking, boiling, grilling, roasting, and broiling instead.  Cook with heart-healthy oils, such as olive, canola, avocado, soybean, or sunflower oil. Meal planning  Eat a balanced diet that includes: ? 4 or more servings of fruits and 4 or more servings of vegetables each day. Try to fill one-half of your plate with fruits and vegetables. ? 6-8 servings of whole grains each day. ? Less than 6 oz (170 g) of lean meat, poultry, or fish each day. A  3-oz (85-g) serving of meat is about the same size as a deck of cards. One egg equals 1 oz (28 g). ? 2-3 servings of low-fat dairy each day. One serving is 1 cup (237 mL). ? 1 serving of nuts, seeds, or beans 5 times each week. ? 2-3 servings of heart-healthy fats. Healthy fats called omega-3 fatty acids are found in foods such as walnuts, flaxseeds, fortified milks, and eggs. These fats are also found in cold-water fish, such as sardines, salmon, and mackerel.  Limit how much you eat of: ? Canned or prepackaged foods. ? Food that is high in trans fat, such as some fried foods. ? Food that is high in saturated fat, such as fatty meat. ? Desserts and other sweets, sugary drinks, and other foods with added sugar. ? Full-fat dairy products.  Do not salt foods before eating.  Do not eat more than 4 egg yolks a week.  Try to eat at least 2 vegetarian meals a week.  Eat more home-cooked food and less restaurant, buffet, and fast food.   Lifestyle  When eating at a restaurant, ask that your food be prepared with less salt or no salt, if possible.  If you drink alcohol: ? Limit how much you use to:  0-1 drink a day for women who are not pregnant.  0-2 drinks a day for men. ? Be aware of how much alcohol is in your drink. In the [place] one drink equals one 12 oz bottle of beer (355 mL), one 5 oz glass of wine (148 mL), or one 1  oz glass of hard liquor (44 mL). General information  Avoid eating more than 2,300 mg of salt a day. If you have hypertension, you may need to reduce your sodium intake to 1,500 mg a day.  Work with your health care provider to maintain a healthy body weight or to lose weight. Ask what an ideal weight is for you.  Get at least 30 minutes of exercise that causes your heart to beat faster (aerobic exercise) most days of the week. Activities may include walking, swimming, or biking.  Work with your health care provider or dietitian to adjust your eating plan to your  individual calorie needs. What foods should I eat? Fruits All fresh, dried, or frozen fruit. Canned fruit in natural juice (without added sugar). Vegetables Fresh or frozen vegetables (raw, steamed, roasted, or grilled). Low-sodium or reduced-sodium tomato and vegetable juice. Low-sodium or reduced-sodium tomato sauce and tomato paste. Low-sodium or reduced-sodium canned vegetables. Grains Whole-grain or whole-wheat bread. Whole-grain or whole-wheat pasta. Brown rice. [place]. Bulgur. Whole-grain and low-sodium cereals. Pita bread. Low-fat, low-sodium crackers. Whole-wheat flour tortillas. Meats and other proteins Skinless chicken or [place]. Ground chicken or [place]. Pork with fat trimmed off. Fish and seafood. Egg whites. Dried beans, peas, or lentils. Unsalted nuts, nut butters, and seeds. Unsalted canned beans. Lean cuts of beef with fat trimmed off. Low-sodium, lean precooked or cured meat, such as sausages or meat loaves. Dairy Low-fat (1%) or fat-free (skim) milk. Reduced-fat, low-fat, or fat-free cheeses. Nonfat, low-sodium ricotta or cottage cheese. Low-fat or nonfat yogurt. Low-fat, low-sodium cheese. Fats and oils Soft margarine without trans fats. Vegetable oil. Reduced-fat, low-fat, or light mayonnaise and salad dressings (reduced-sodium). Canola, safflower, olive, avocado, soybean, and sunflower oils. Avocado. Seasonings and condiments Herbs. Spices. Seasoning mixes without salt. Other foods Unsalted popcorn and pretzels. Fat-free sweets. The items listed above may not be a complete list of foods and beverages you can eat. Contact a dietitian for more information. What foods should I avoid? Fruits Canned fruit in a light or heavy syrup. Fried fruit. Fruit in cream or butter sauce. Vegetables Creamed or fried vegetables. Vegetables in a cheese sauce. Regular canned vegetables (not low-sodium or reduced-sodium). Regular canned tomato sauce and paste (not low-sodium or  reduced-sodium). Regular tomato and vegetable juice (not low-sodium or reduced-sodium). Angie Fava. Olives. Grains Baked goods made with fat, such as croissants, muffins, or some breads. Dry pasta or rice meal packs. Meats and other proteins Fatty cuts of meat. Ribs. Fried meat. Berniece Salines. Bologna, salami, and other precooked or cured meats, such as sausages or meat loaves. Fat from the back of a pig (fatback). Bratwurst. Salted nuts and seeds. Canned beans with added salt. Canned or smoked fish. Whole eggs or egg yolks. Chicken or [place] with skin. Dairy Whole or 2% milk, cream, and half-and-half. Whole or full-fat cream cheese. Whole-fat or sweetened yogurt. Full-fat cheese. Nondairy creamers. Whipped toppings. Processed cheese and cheese spreads. Fats and oils Butter. Stick margarine. Lard. Shortening. Ghee. Bacon fat. Tropical oils, such as coconut, palm kernel, or palm oil. Seasonings and condiments Onion salt, garlic salt, seasoned salt, table salt, and sea salt. Worcestershire sauce. Tartar sauce. Barbecue sauce. Teriyaki sauce. Soy sauce, including reduced-sodium. Steak sauce. Canned and packaged gravies. Fish sauce. Oyster sauce. Cocktail sauce. Store-bought horseradish. Ketchup. Mustard. Meat flavorings and tenderizers. Bouillon cubes. Hot sauces. Pre-made or packaged marinades. Pre-made or packaged taco seasonings. Relishes. Regular salad dressings. Other foods Salted popcorn and pretzels. The items listed above may not be a  complete list of foods and beverages you should avoid. Contact a dietitian for more information. Where to find more information  National Heart, Lung, and Blood Institute: https://wilson-eaton.com/  American Heart Association: www.heart.org  Academy of Nutrition and Dietetics: www.eatright.[place]: www.kidney.org Summary  The DASH eating plan is a healthy eating plan that has been shown to reduce high blood pressure (hypertension). It may also reduce  your risk for type 2 diabetes, heart disease, and stroke.  When on the DASH eating plan, aim to eat more fresh fruits and vegetables, whole grains, lean proteins, low-fat dairy, and heart-healthy fats.  With the DASH eating plan, you should limit salt (sodium) intake to 2,300 mg a day. If you have hypertension, you may need to reduce your sodium intake to 1,500 mg a day.  Work with your health care provider or dietitian to adjust your eating plan to your individual calorie needs. This information is not intended to replace advice given to you by your health care provider. Make sure you discuss any questions you have with your health care provider. Document Revised: 09/23/2019 Document Reviewed: 09/23/2019 Elsevier Patient Education  2021 [employer].

## 2020-12-12 ENCOUNTER — Other Ambulatory Visit: Payer: Self-pay

## 2020-12-12 ENCOUNTER — Ambulatory Visit (INDEPENDENT_AMBULATORY_CARE_PROVIDER_SITE_OTHER): Payer: Medicare Other | Admitting: Urology

## 2020-12-12 ENCOUNTER — Encounter: Payer: Self-pay | Admitting: Urology

## 2020-12-12 VITALS — BP 165/80 | HR 68 | Ht 61.0 in | Wt 137.0 lb

## 2020-12-12 DIAGNOSIS — N390 Urinary tract infection, site not specified: Secondary | ICD-10-CM

## 2020-12-12 MED ORDER — ESTROGENS, CONJUGATED 0.625 MG/GM VA CREA: 1.0000 | TOPICAL_CREAM | Freq: Every day | VAGINAL | 12 refills | Status: DC

## 2020-12-12 MED ORDER — ESTROGENS, CONJUGATED 0.625 MG/GM VA CREA: TOPICAL_CREAM | VAGINAL | 12 refills | Status: DC

## 2020-12-12 NOTE — Progress Notes (Signed)
   12/12/2020 11:01 AM   Katherine Oliver [birth year] [id number]  Reason for visit: Follow up recurrent UTIs  HPI: Briefly, 75 year old female with history of recurrent Enterococcus UTIs, who also has had a long complex course with necrotizing pancreatitis. She currently is very well managed on cranberry tablets and topical estrogen cream, and has not had any UTIs over the last 4 months. She denies any urinary symptoms today. She was previously on trimethoprim prophylaxis, but this was discontinued by PCP as was thought to be contributing to AKI.  We discussed the evaluation and treatment of patients with recurrent UTIs at length.  We specifically discussed the differences between asymptomatic bacteriuria and true urinary tract infection.  We discussed the AUA definition of recurrent UTI of at least 2 culture proven symptomatic acute cystitis episodes in a 62-month period, or 3 within a 1 year period.  We discussed the importance of culture directed antibiotic treatment, and antibiotic stewardship.  First-line therapy includes nitrofurantoin(5 days), Bactrim(3 days), or fosfomycin(3 g single dose).  Possible etiologies of recurrent infection include periurethral tissue atrophy in postmenopausal woman, constipation, sexual activity, incomplete emptying, anatomic abnormalities, and even genetic predisposition.  Finally, we discussed the role of perineal hygiene, timed voiding, adequate hydration, topical vaginal estrogen, cranberry prophylaxis, and low-dose antibiotic prophylaxis.  Continue cranberry tablets and topical estrogen cream RTC 1 year  Billey Co, MD  [name] [address], [place], [place] [address] [phone]

## 2020-12-12 NOTE — Patient Instructions (Signed)
Urinary Tract Infection, Adult A urinary tract infection (UTI) is an infection of any part of the urinary tract. The urinary tract includes:  The kidneys.  The ureters.  The bladder.  The urethra. These organs make, store, and get rid of pee (urine) in the body. What are the causes? This infection is caused by germs (bacteria) in your genital area. These germs grow and cause swelling (inflammation) of your urinary tract. What increases the risk? The following factors may make you more likely to develop this condition:  Using a small, thin tube (catheter) to drain pee.  Not being able to control when you pee or poop (incontinence).  Being female. If you are female, these things can increase the risk: ? Using these methods to prevent pregnancy:  A medicine that kills sperm (spermicide).  A device that blocks sperm (diaphragm). ? Having low levels of a female hormone (estrogen). ? Being pregnant. You are more likely to develop this condition if:  You have genes that add to your risk.  You are sexually active.  You take antibiotic medicines.  You have trouble peeing because of: ? A prostate that is bigger than normal, if you are female. ? A blockage in the part of your body that drains pee from the bladder. ? A kidney stone. ? A nerve condition that affects your bladder. ? Not getting enough to drink. ? Not peeing often enough.  You have other conditions, such as: ? Diabetes. ? A weak disease-fighting system (immune system). ? Sickle cell disease. ? Gout. ? Injury of the spine. What are the signs or symptoms? Symptoms of this condition include:  Needing to pee right away.  Peeing small amounts often.  Pain or burning when peeing.  Blood in the pee.  Pee that smells bad or not like normal.  Trouble peeing.  Pee that is cloudy.  Fluid coming from the vagina, if you are female.  Pain in the belly or lower back. Other symptoms include:  Vomiting.  Not  feeling hungry.  Feeling mixed up (confused). This may be the first symptom in older adults.  Being tired and grouchy (irritable).  A fever.  Watery poop (diarrhea). How is this treated?  Taking antibiotic medicine.  Taking other medicines.  Drinking enough water. In some cases, you may need to see a specialist. Follow these instructions at home: Medicines  Take over-the-counter and prescription medicines only as told by your doctor.  If you were prescribed an antibiotic medicine, take it as told by your doctor. Do not stop taking it even if you start to feel better. General instructions  Make sure you: ? Pee until your bladder is empty. ? Do not hold pee for a long time. ? Empty your bladder after sex. ? Wipe from front to back after peeing or pooping if you are a female. Use each tissue one time when you wipe.  Drink enough fluid to keep your pee pale yellow.  Keep all follow-up visits.   Contact a doctor if:  You do not get better after 1-2 days.  Your symptoms go away and then come back. Get help right away if:  You have very bad back pain.  You have very bad pain in your lower belly.  You have a fever.  You have chills.  You feeling like you will vomit or you vomit. Summary  A urinary tract infection (UTI) is an infection of any part of the urinary tract.  This condition is caused by   germs in your genital area.  There are many risk factors for a UTI.  Treatment includes antibiotic medicines.  Drink enough fluid to keep your pee pale yellow. This information is not intended to replace advice given to you by your health care provider. Make sure you discuss any questions you have with your health care provider. Document Revised: 06/01/2020 Document Reviewed: 06/01/2020 Elsevier Patient Education  2021 Elsevier Inc.  

## 2020-12-19 ENCOUNTER — Telehealth: Payer: Self-pay

## 2020-12-19 NOTE — Telephone Encounter (Signed)
Pt dropped off recent BP readings.  Placed in Dr. Synthia Innocent box.

## 2020-12-19 NOTE — Telephone Encounter (Signed)
Opened in error

## 2020-12-20 NOTE — Telephone Encounter (Signed)
BP log reviewed: Over the past week, BP 130-160/60-90s, HR 60-70  BPs slowly improving but remaining high. Please clarify - are these measurements using the same cuff as she brought in that showed BPs 20 pts higher than BP in office? If so, wouldn't make any changes.  If this is with a new cuff, let me know.

## 2020-12-20 NOTE — Telephone Encounter (Signed)
Spoke with pt asking about BP cuff.  Pt says she's using same cuff she brought to the office.  I relayed Dr. Synthia Innocent message.  Pt verbalizes understanding.

## 2020-12-26 DIAGNOSIS — Z79899 Other long term (current) drug therapy: Secondary | ICD-10-CM | POA: Diagnosis not present

## 2020-12-26 DIAGNOSIS — E119 Type 2 diabetes mellitus without complications: Secondary | ICD-10-CM | POA: Diagnosis not present

## 2020-12-26 DIAGNOSIS — S36209S Unspecified injury of unspecified part of pancreas, sequela: Secondary | ICD-10-CM | POA: Diagnosis not present

## 2020-12-26 DIAGNOSIS — Z8719 Personal history of other diseases of the digestive system: Secondary | ICD-10-CM | POA: Diagnosis not present

## 2020-12-26 DIAGNOSIS — Z794 Long term (current) use of insulin: Secondary | ICD-10-CM | POA: Diagnosis not present

## 2020-12-26 DIAGNOSIS — E139 Other specified diabetes mellitus without complications: Secondary | ICD-10-CM | POA: Diagnosis not present

## 2020-12-26 LAB — HEMOGLOBIN A1C: Hemoglobin A1C: 7.4

## 2020-12-31 ENCOUNTER — Other Ambulatory Visit: Payer: Self-pay | Admitting: *Deleted

## 2020-12-31 NOTE — Patient Outreach (Signed)
[facility]) Care Management  12/31/2020  J[facility] 10-26-1946 [id number]   Telephone Assessment-Unsuccessful  RN attempted outreach call however unsuccessful.  RN able to leave a HIPAA approved voice message requesting a call back.  Will rescheduled another outreach over the next week.  [name], RN Care Management Coordinator [place] Office [phone]

## 2021-01-01 ENCOUNTER — Encounter: Payer: Self-pay | Admitting: Family Medicine

## 2021-01-01 ENCOUNTER — Other Ambulatory Visit: Payer: Self-pay

## 2021-01-01 ENCOUNTER — Ambulatory Visit (INDEPENDENT_AMBULATORY_CARE_PROVIDER_SITE_OTHER): Payer: Medicare Other | Admitting: Family Medicine

## 2021-01-01 VITALS — BP 128/72 | HR 77 | Temp 98.2°F | Ht 61.0 in | Wt 142.5 lb

## 2021-01-01 DIAGNOSIS — I10 Essential (primary) hypertension: Secondary | ICD-10-CM | POA: Diagnosis not present

## 2021-01-01 DIAGNOSIS — Z794 Long term (current) use of insulin: Secondary | ICD-10-CM | POA: Diagnosis not present

## 2021-01-01 DIAGNOSIS — E118 Type 2 diabetes mellitus with unspecified complications: Secondary | ICD-10-CM

## 2021-01-01 DIAGNOSIS — E1165 Type 2 diabetes mellitus with hyperglycemia: Secondary | ICD-10-CM | POA: Diagnosis not present

## 2021-01-01 DIAGNOSIS — IMO0002 Reserved for concepts with insufficient information to code with codable children: Secondary | ICD-10-CM

## 2021-01-01 MED ORDER — ACCU-CHEK AVIVA PLUS VI STRP: ORAL_STRIP | 3 refills | Status: DC

## 2021-01-01 MED ORDER — HYDROCHLOROTHIAZIDE 12.5 MG PO CAPS
12.5000 mg | ORAL_CAPSULE | Freq: Every day | ORAL | 3 refills | Status: DC
Start: 2021-01-01 — End: 2021-03-18

## 2021-01-01 NOTE — Patient Instructions (Addendum)
BP is staying a bit elevated - start hydrochlorothiazide 12.5mg  daily sent to pharmacy  If doing well with this, we will combine with benazepril into combo pill.  Let me know how you do with new BP regimen.  Return in 1 week for labs only.  Return in 3 months for follow up visit.

## 2021-01-01 NOTE — Progress Notes (Signed)
Patient ID: Katherine Oliver, female    DOB: 06/18/1946, 75 y.o.   MRN: [id number]  This visit was conducted in person.  BP 128/72   Pulse 77   Temp 98.2 F (36.8 C) (Temporal)   Ht 5\' 1"  (1.549 m)   Wt 142 lb 8 oz (64.6 kg)   SpO2 96%   BMI 26.93 kg/m    CC: 1 mo HTN f/u visit  Subjective:   HPI: Katherine Oliver is a 75 y.o. female presenting on 01/01/2021 for Hypertension (Here for 1 mo f/u.)   HTN - Compliant with current antihypertensive regimen of benazepril 20mg  daily, amlodipine 10mg  daily and Toprol XL 50mg  daily.  Does check blood pressures at home and brings log: 2 wks ago BP 130-160/60-90s, HR 60-70, this last week BP 130-150s/70-80s, HR 60-70s. No low blood pressure readings or symptoms of dizziness/syncope.  Denies HA, vision changes, CP/tightness, SOB, leg swelling. Notes increased fatigue with daytime naps of 2 hours since increasing metoprolol dosing.   DM - followed by endo. Trying to get approved for CGM. Med list updated.  A1c 7.4% at endo last week.      Relevant past medical, surgical, family and social history reviewed and updated as indicated. Interim medical history since our last visit reviewed. Allergies and medications reviewed and updated. Outpatient Medications Prior to Visit  Medication Sig Dispense Refill  . acetaminophen (TYLENOL) 325 MG tablet Take 650 mg by mouth every 6 (six) hours as needed for mild pain.    Marland Kitchen amLODipine (NORVASC) 10 MG tablet Take 1 tablet (10 mg total) by mouth daily. 90 tablet 1  . B-D UF III MINI PEN NEEDLES 31G X 5 MM MISC Inject into the skin 4 (four) times daily.    . benazepril (LOTENSIN) 20 MG tablet Take 1 tablet (20 mg total) by mouth daily. 90 tablet 1  . Cholecalciferol (VITAMIN D3) 25 MCG (1000 UT) CAPS Take 1 capsule (1,000 Units total) by mouth daily. 30 capsule   . conjugated estrogens (PREMARIN) vaginal cream Estrogen Cream Instruction Discard applicator Apply pea sized amount to tip of  finger to urethra before bed. Wash hands well after application. Use Monday, Wednesday and Friday 42.5 g 12  . CREON 36000-114000 units CPEP capsule SMARTSIG:Capsule(s) By Mouth As Directed    . ferrous sulfate 325 (65 FE) MG tablet Take 1 tablet (325 mg total) by mouth daily with breakfast. 90 tablet 3  . folic acid (FOLVITE) 1 MG tablet Take 1 tablet (1 mg total) by mouth daily. 90 tablet 3  . LANTUS SOLOSTAR 100 UNIT/ML Solostar Pen Inject 22 Units into the skin at bedtime.    Marland Kitchen loperamide (IMODIUM A-D) 2 MG tablet Take 2 mg by mouth 4 (four) times daily as needed for diarrhea or loose stools.    . meclizine (ANTIVERT) 25 MG tablet Take 1 tablet (25 mg total) by mouth 3 (three) times daily as needed for nausea. 30 tablet 0  . metoprolol succinate (TOPROL XL) 50 MG 24 hr tablet Take 1 tablet (50 mg total) by mouth daily. 90 tablet 1  . Multiple Vitamin (MULTIVITAMIN) tablet 1 tablet by Per J Tube route daily.     . pantoprazole (PROTONIX) 40 MG tablet Take 1 tablet (40 mg total) by mouth daily. 90 tablet 3  . rosuvastatin (CRESTOR) 10 MG tablet Take 1 tablet by mouth daily.    Marland Kitchen ACCU-CHEK AVIVA PLUS test strip USE AS DIRECTED TO CHECK BLOOD SUGARS ONCE DAILY  E11.21 100 strip 7  . insulin glargine (LANTUS SOLOSTAR) 100 UNIT/ML Solostar Pen Inject into the skin.    Marland Kitchen insulin lispro (HUMALOG) 100 UNIT/ML injection Inject 10 Units into the skin 3 (three) times daily before meals. Sliding scale    . Continuous Blood Gluc Receiver (DEXCOM G6 RECEIVER) Omro 1 dexcom receiver (Patient not taking: Reported on 01/01/2021)    . Continuous Blood Gluc Sensor (DEXCOM G6 SENSOR) MISC Dispense 1 box Dexcom G6 sensors (3 sensors per box) (Patient not taking: Reported on 01/01/2021)    . Continuous Blood Gluc Transmit (DEXCOM G6 TRANSMITTER) MISC Dispense Dexcom G6 Transmitter (Patient not taking: Reported on 01/01/2021)    . insulin lispro (HUMALOG) 100 UNIT/ML injection Inject 0.1 mLs (10 Units total) into the  skin 3 (three) times daily before meals. Plus sliding scale     No facility-administered medications prior to visit.     Per HPI unless specifically indicated in ROS section below Review of Systems Objective:  BP 128/72   Pulse 77   Temp 98.2 F (36.8 C) (Temporal)   Ht 5\' 1"  (1.549 m)   Wt 142 lb 8 oz (64.6 kg)   SpO2 96%   BMI 26.93 kg/m   Wt Readings from Last 3 Encounters:  01/01/21 142 lb 8 oz (64.6 kg)  12/12/20 137 lb (62.1 kg)  12/04/20 139 lb (63 kg)      Physical Exam Vitals and nursing note reviewed.  Constitutional:      Appearance: Normal appearance. She is not ill-appearing.  Cardiovascular:     Rate and Rhythm: Normal rate and regular rhythm.     Pulses: Normal pulses.     Heart sounds: Murmur (2/6 systolic USB) heard.    Pulmonary:     Effort: Pulmonary effort is normal. No respiratory distress.     Breath sounds: Normal breath sounds. No wheezing, rhonchi or rales.  Musculoskeletal:     Right lower leg: No edema.     Left lower leg: No edema.  Neurological:     Mental Status: She is alert.  Psychiatric:        Mood and Affect: Mood normal.        Behavior: Behavior normal.       Results for orders placed or performed in visit on 01/01/21  Hemoglobin A1c  Result Value Ref Range   Hemoglobin A1C 7.4    Assessment & Plan:  This visit occurred during the SARS-CoV-2 public health emergency.  Safety protocols were in place, including screening questions prior to the visit, additional usage of staff PPE, and extensive cleaning of exam room while observing appropriate contact time as indicated for disinfecting solutions.   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  Continue benazepril 20mg  daily, Toprol XL 50mg  daily, and amlodipine 10mg  daily.  Notes increased fatigue, daytime somnolence - could be related to recently increased Toprol XL dose. Will watch for now.  Check labs in 1 wk. RTC 3 mo f/u visit      Relevant Medications   hydrochlorothiazide (MICROZIDE) 12.5 MG capsule   Other Relevant Orders   Basic metabolic panel  Meds ordered this encounter  Medications  . DISCONTD: glucose blood (ACCU-CHEK AVIVA PLUS) test strip    Sig: Use as instructed to check blood sugar 4 times daily.    Dispense:  400 strip    Refill:  3  . glucose blood (ACCU-CHEK AVIVA PLUS) test strip    Sig: Use as instructed to check blood sugar 4 times daily.    Dispense:  400 strip    Refill:  3  . hydrochlorothiazide (MICROZIDE) 12.5 MG capsule    Sig: Take 1 capsule (12.5 mg total) by mouth daily.    Dispense:  30 capsule    Refill:  3    In addition to toprol, benazepril, and amlodipine   Orders Placed This Encounter  Procedures  . Basic metabolic panel    Standing Status:   Future    Standing Expiration Date:   01/01/2022  . Hemoglobin A1c    This external order was created through the Results Console.    Patient Instructions  BP is staying a bit elevated - start hydrochlorothiazide 12.5mg  daily sent to pharmacy  If doing well with this, we will combine with benazepril into combo pill.  Let me know how you do with new BP regimen.  Return in 1 week for labs only.  Return in 3 months for follow up visit.   Follow up plan: Return in about 3 months (around 04/03/2021).  [name], MD

## 2021-01-01 NOTE — Assessment & Plan Note (Signed)
Staying mildly elevated - will add hctz 12.5mg  to regimen.  Continue benazepril 20mg  daily, Toprol XL 50mg  daily, and amlodipine 10mg  daily.  Notes increased fatigue, daytime somnolence - could be related to recently increased Toprol XL dose. Will watch for now.  Check labs in 1 wk. RTC 3 mo f/u visit

## 2021-01-04 ENCOUNTER — Encounter: Payer: Self-pay | Admitting: *Deleted

## 2021-01-04 ENCOUNTER — Other Ambulatory Visit: Payer: Self-pay | Admitting: *Deleted

## 2021-01-04 NOTE — Patient Outreach (Signed)
[facility]) Care Management  01/04/2021  Kyler Lerette [facility] [birth year] [id number]   Telephone Assessment-Successful-Diabetes  RN spoke with pt today and received an update on pt's ongoing management of care pt states she has improved with her A1C and her AM glucose readings. Pt also consults with a dietitian 1-2 X monthly from [place] concerning improving her diabetes. Pt continues to work with her insurance carrier on obtaining a Dexcom meter with 4 CBGs daily with MCR only covering 3 glucose readings from her supplies. Pt is out of pocket for the rest until she is approved again for the Dexcom monitor.   Reviewed and discussed the current plan of care along with goals and interventions. Will continue to encouraged adherence and inquire on any needed resources as pt continue to meet the goals. Pt continue to be proactive with managing her ongoing diabetes through dietary habits with better choices and ongoing counseling as needed.   Will update pt's provider on pt's ongoing disposition with [facility] services and follow up next month with pt's ongoing progress.  Goals Addressed            This Visit's Progress   . THN-Disease Progression Prevented or Minimized   On track    Follow up Date 02/05/2021 Timeframe:  Short-Term Goal Priority:  medium Start Date:     11/30/2020                        Expected End Date:     03/01/2021                  Evidence-based guidance:   Tailor lifestyle advice to individual; review progress regularly; give frequent encouragement and respond positively to incremental successes.   Assess for and promote awareness of worsening disease or development of comorbidity.   Prepare patient for laboratory and diagnostic exams based on risk and presentation.   Prepare patient for use of pharmacologic therapy that may include diuretic, beta-blocker, beta-blocker/thiazide combination, angiotensin-converting enzyme inhibitor, renin-angiotensin blocker  or calcium-channel blocker.   Expect periodic adjustments to pharmacologic therapy; manage side effects.   Promote a healthy diet that includes primarily plant-based foods, such as fruits, vegetables, whole grains, beans and legumes, low-fat dairy and lean meats.    Consider moderate reduction in sodium intake by avoiding the addition of salt to prepared foods and limiting processed meats, canned soup, frozen meals and salty snacks.    Review sources of stress; explore current coping strategies and encourage use of mindfulness, yoga, meditation or exercise to manage stress.   Notes:  3/4- Pt continue to follow this goal and follow the interventions to prevent disease progression and prevent acute events. Pt discussed changes in her dietary habits to accommodate. 1/28-Discussed pt's current disposition with managing both her diabetes and hypertension. Along with the goal and related interventions. Pt with a understanding and the action plan discussed.    . THN-Monitor and Manage My Blood Sugar   On track    Follow Up Date: 02/05/2021 Timeframe:  Short-Term Goal Priority:  Medium Start Date:  10/30/2020                     Expected End Date:       03/01/2021                  - check blood sugar at prescribed times - check blood sugar if I feel it is too high or too  low - enter blood sugar readings and medication or insulin into daily log - take the blood sugar log to all doctor visits - take the blood sugar meter to all doctor visits    Why is this important?   Checking your blood sugar at home helps to keep it from getting very high or very low.  Writing the results in a diary or log helps the doctor know how to care for you.  Your blood sugar log should have the time, date and the results.  Also, write down the amount of insulin or other medicine that you take.  Other information, like what you ate, exercise done and how you were feeling, will also be helpful.     Notes:    3/4-Discussed ongoing management of care as pt states she is doing better with her Last A1c last week at 7.1 from 8 several months ago. Pt states she also has a dietitian that call 1-2 X monthly to discuss ways to improve her ongoing diabetes. Fasting glucose read lingers around 129 with the lowest at 90 over the last month. Pt aware of what to do if acute readings are encountered.  1/28-Pt reports readings from 153-183 as she continues to manage. Repeat A1C in Feb and pt will follow up with her Endocrinologist at that time. Pt verified she has changed her dietary habits to reflect improved readings and will continue to work on managing her diabetes however her focus has been on managing her HTN due to the recent change. Strongly encourage adherence with the ongoing plan of care related. Dec-Pt continue to work with her dietary habits high protein, low carbohydrate. Recent A1c 8.9 Nov treated with increased insulin doses. Reports reading have improved after medication changes (143-195). Will extend this goal to allow adherence with the new increased insulin dosages.     . THN-Set My Target A1C   On track    Follow Up Date 02/05/2021 Timeframe:  Long-Range Goal Priority:  Medium Start Date:    10/30/2020                   Expected End Date:     03/01/2021                               - set target A1C    Why is this important?   Your target A1C is decided together by you and your doctor.  It is based on several things like your age and other health issues.    Notes:  1/28-Pending repeat A1C the last of Feb and follow up with his endocrinologist. Currently reports readings for CBG from 153-183 asymptomatic. Continue to working on improving her A1c  Dec-A1C 8.9 09/22/2020-insulin increased with daily glucose readings improved 143-195. Will continue to educate on dietary habits and reducing carbohydrates       [name], RN Care Management Coordinator [place] [facility]  [phone]

## 2021-01-08 ENCOUNTER — Other Ambulatory Visit: Payer: Self-pay

## 2021-01-08 ENCOUNTER — Other Ambulatory Visit (INDEPENDENT_AMBULATORY_CARE_PROVIDER_SITE_OTHER): Payer: Medicare Other

## 2021-01-08 DIAGNOSIS — I10 Essential (primary) hypertension: Secondary | ICD-10-CM

## 2021-01-08 LAB — BASIC METABOLIC PANEL
BUN: 33 mg/dL — ABNORMAL HIGH (ref 6–23)
CO2: 27 mEq/L (ref 19–32)
Calcium: 10.2 mg/dL (ref 8.4–10.5)
Chloride: 103 mEq/L (ref 96–112)
Creatinine, Ser: 1.24 mg/dL — ABNORMAL HIGH (ref 0.40–1.20)
GFR: 42.3 mL/min — ABNORMAL LOW (ref >60.00)
Glucose, Bld: 209 mg/dL — ABNORMAL HIGH (ref 70–99)
Potassium: 4.9 mEq/L (ref 3.5–5.1)
Sodium: 139 mEq/L (ref 135–145)

## 2021-01-15 ENCOUNTER — Encounter: Payer: Self-pay | Admitting: *Deleted

## 2021-01-15 ENCOUNTER — Other Ambulatory Visit: Payer: Self-pay

## 2021-01-15 ENCOUNTER — Ambulatory Visit
Admission: RE | Admit: 2021-01-15 | Discharge: 2021-01-15 | Disposition: A | Discharge status: Home / Self Care | Payer: Medicare Other | Source: Ambulatory Visit | Attending: Radiology | Admitting: Radiology

## 2021-01-15 DIAGNOSIS — K8591 Acute pancreatitis with uninfected necrosis, unspecified: Secondary | ICD-10-CM

## 2021-01-15 DIAGNOSIS — Z4589 Encounter for adjustment and management of other implanted devices: Secondary | ICD-10-CM | POA: Diagnosis not present

## 2021-01-15 HISTORY — PX: IR RADIOLOGIST EVAL & MGMT: IMG5224

## 2021-01-15 NOTE — Progress Notes (Signed)
Chief Complaint: Patient was consulted remotely today (TeleHealth) for IVC filter management.  Referring Physician(s): [name]  History of Present Illness: Latrese Carolan is a 75 y.o. female with a complex medical history including history of necrotizing pancreatitis, diabetes and hypertension.  Patient reports difficult control of her diabetes and her endocrinologist continues to make adjustments with her insulin.  She is also struggling with hypertension.  She denies any lower extremity swelling.  Patient had an IVC filter placed on 04/26/2020 due to right leg DVT and was unable to be anticoagulated due to anemia and blood loss.  I last saw the patient on 07/31/2020 and we decided to hold off on IVC filter retrieval due to her comorbidities.  Patient is currently not anticoagulated.  Past Medical History:  Diagnosis Date  . Allergic rhinitis   . Arthritis   . 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 nephropathy ([place])    DSME at [facility] 01/2016   . DKA (diabetic ketoacidoses) 04/22/2020  . Frequent epistaxis 05/16/2019   S/p cauterization with resolution 2020  . GERD (gastroesophageal reflux disease)   . Hepatic steatosis    by abd [place] 05/2012, mild transaminitis - normal iron sat and viral hep panel (2011), stable [place] 2017  . History of chicken pox   . History of measles   . History of recurrent UTIs    on chronic keflex  . HLD (hyperlipidemia)   . HTN (hypertension)   . 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 Kidney cyst, acquired 01/2016   L kidney by [place]  . Kidney stone 01/2016   L kidney by [place]  . Lung nodules 11/2013   overall stable on f/u CT 01/2016  . Osteopenia 06/2013   mild, forearm T -1.1, hip and spine WNL  . Pancreatitis   . Polycythemia    mild, stable (2013)  . Primary localized  osteoarthritis of right knee 01/05/2019  . Rosacea    metrogel    Past Surgical History:  Procedure Laterality Date  . APPENDECTOMY  1987  . BILIARY STENT PLACEMENT  12/14/2019   Procedure: BILIARY STENT PLACEMENT;  Surgeon: [name], MD;  Location: [facility];  Service: Endoscopy;;  . BREAST BIOPSY Right 1963   benign  . BREAST BIOPSY Right 08/2014   benign- core  . cardiolite stress test  04/2004   normal  . CESAREAN SECTION  5400;8676   x2  . CHOLECYSTECTOMY  2003  . COLONOSCOPY  09/26/2008   adenomatous polyp, rpt 3-5 yrs  . COLONOSCOPY  08/2012   adenomatous polyps, diverticulosis, rec rpt 5 yrs [name])  . COLONOSCOPY WITH PROPOFOL N/A 02/05/2018   4TA, SSA, diverticulosis, rpt 3 yrs [name], [name], MD)  . dexa  2003   normal  . dexa  06/2013   [facility] - Tscore -1.1 forearm, normal spine and femur  . ERCP N/A 12/14/2019   Procedure: ENDOSCOPIC RETROGRADE CHOLANGIOPANCREATOGRAPHY (ERCP);  Surgeon: [name], MD;  Location: [facility];  Service: Endoscopy;  Laterality: N/A;  . ERCP  01/2020   nonbleeding gastric ulcer - [facility] hospitalization (Dr Mont Dutton)  . ESOPHAGOGASTRODUODENOSCOPY N/A 12/19/2019   Procedure: ESOPHAGOGASTRODUODENOSCOPY (EGD);  Surgeon: [name], MD;  Location: [facility] ENDOSCOPY;  Service: Endoscopy;  Laterality: N/A;  . ESOPHAGOGASTRODUODENOSCOPY  01/2020   pre existing AXIOS cystogastrostomy stent  s/p necrosectomy - [facility] hospitalization (Dr Mont Dutton)  . ESOPHAGOGASTRODUODENOSCOPY N/A 04/24/2020   Procedure: ESOPHAGOGASTRODUODENOSCOPY (EGD);  Surgeon: Wonda Horner, MD;  Location: [facility] ENDOSCOPY;  Service: Endoscopy;  Laterality: N/A;  . ESOPHAGOGASTRODUODENOSCOPY  05/2020   normal esophagus, duodenum, erythematous mucosa in gastric body, gastric cystogastrostomy stent removed ([facility])  . IR FLUORO GUIDE CV LINE RIGHT  12/27/2019  . IR FLUORO GUIDE CV LINE RIGHT  04/24/2020  . IR IVC FILTER PLMT / S&I /IMG GUID/MOD SED  04/26/2020  . IR RADIOLOGIST  EVAL & MGMT  07/31/2020  . IR REMOVAL TUN CV CATH W/O FL  01/03/2020  . IR REMOVAL TUN CV CATH W/O FL  04/29/2020  . IR REPLC DUODEN/JEJUNO TUBE PERCUT W/FLUORO  04/01/2020  . IR US GUIDE VASC ACCESS RIGHT  04/24/2020  . SPHINCTEROTOMY  12/14/2019   Procedure: SPHINCTEROTOMY;  Surgeon: [name], MD;  Location: [place];  Service: Endoscopy;;  . 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 TRANSTHORACIC ECHOCARDIOGRAM  04/2019   EF 55-60%, modLVH, impaired relaxation   . TRIGGER FINGER RELEASE  2007;2010;2011   bilateral  . TRIGGER FINGER RELEASE  02/2017  . VAGINAL HYSTERECTOMY  1984   for menorrhagia, ovaries in place    Allergies: Azithromycin, Nickel, Sulfa antibiotics, Vinegar [acetic acid], Adhesive [tape], and Keflex [cephalexin]  Medications: Prior to Admission medications   Medication Sig Start Date End Date Taking? Authorizing Provider  acetaminophen (TYLENOL) 325 MG tablet Take 650 mg by mouth every 6 (six) hours as needed for mild pain.    [provider]  amLODipine (NORVASC) 10 MG tablet Take 1 tablet (10 mg total) by mouth daily. 12/04/20   [name], MD  B-D UF III MINI PEN NEEDLES 31G X 5 MM MISC Inject into the skin 4 (four) times daily. 12/27/20   [provider]  benazepril (LOTENSIN) 20 MG tablet Take 1 tablet (20 mg total) by mouth daily. 12/04/20   [name], MD  Cholecalciferol (VITAMIN D3) 25 MCG (1000 UT) CAPS Take 1 capsule (1,000 Units total) by mouth daily. 10/02/20   [name], MD  conjugated estrogens (PREMARIN) vaginal cream Estrogen Cream Instruction Discard applicator Apply pea sized amount to tip of finger to urethra before bed. Wash hands well after application. Use Monday, Wednesday and Friday 12/12/20   Billey Co, MD  Continuous Blood Gluc Receiver ([place]) [place] 1 dexcom receiver Patient not taking: No sig reported 12/26/20   [provider]  Continuous Blood Gluc Sensor (DEXCOM G6 SENSOR) MISC Dispense 1 box Dexcom G6 sensors (3 sensors per box) Patient not taking: No sig reported 12/26/20   [provider]  Continuous Blood Gluc Transmit (DEXCOM G6 TRANSMITTER) MISC Dispense Dexcom G6 Transmitter Patient not taking: No sig reported 12/26/20   [provider]  CREON [id number] units CPEP capsule SMARTSIG:Capsule(s) By Mouth As Directed 05/25/20   [provider]  ferrous sulfate 325 (65 FE) MG tablet Take 1 tablet (325 mg total) by mouth daily with breakfast. 07/02/20   [name], MD  folic acid (FOLVITE) 1 MG tablet Take 1 tablet (1 mg total) by mouth daily. 07/02/20   [name], MD  glucose blood (ACCU-CHEK AVIVA PLUS) test strip Use as instructed to check blood sugar 4 times daily. 01/01/21   [name], MD  hydrochlorothiazide (MICROZIDE) 12.5 MG capsule Take 1  capsule (12.5 mg total) by mouth daily. 01/01/21   [name], MD  insulin lispro (HUMALOG) 100 UNIT/ML injection Inject 0.1 mLs (10 Units total) into the skin 3 (three) times daily before meals. Plus sliding scale 01/01/21   [name], MD  LANTUS SOLOSTAR 100 UNIT/ML Solostar Pen Inject 22 Units into the skin at bedtime. 10/02/20   [name], MD  loperamide (IMODIUM A-D) 2 MG tablet Take 2 mg by mouth 4 (four) times daily as needed for diarrhea or loose stools.    [provider]  meclizine (ANTIVERT) 25 MG tablet Take 1 tablet (25 mg total) by mouth 3 (three) times daily as needed for nausea. 03/13/20   Nicole Kindred A, DO  metoprolol succinate (TOPROL XL) 50 MG 24 hr tablet Take 1 tablet (50 mg total) by mouth daily. 12/04/20 12/04/21  [name], MD  Multiple Vitamin (MULTIVITAMIN) tablet 1 tablet by Per J Tube route daily.     [provider]  pantoprazole (PROTONIX) 40 MG tablet Take 1  tablet (40 mg total) by mouth daily. 07/02/20   [name], MD  rosuvastatin (CRESTOR) 10 MG tablet Take 1 tablet by mouth daily. 08/15/20 08/15/21  [provider]     Family History  Problem Relation Age of Onset  . Stroke Mother        several  . Hyperlipidemia Mother   . Hypertension Mother   . Cancer Father        colon  . Hypertension Father   . Hyperlipidemia Father   . Coronary artery disease Father 74       MIx1, CABG  . Cancer Paternal Aunt        abdominal  . Coronary artery disease Maternal Grandmother   . Diabetes Maternal Grandfather   . Coronary artery disease Maternal Grandfather   . Breast cancer Neg Hx     Social History   Socioeconomic History  . Marital status: Married    Spouse name: Not on file  . Number of children: Not on file  . Years of education: Not on file  . Highest education level: Not on file  Occupational History  . Not on file  Tobacco Use  . Smoking status: Never Smoker  . Smokeless tobacco: Never Used  Vaping Use  . Vaping Use: Never used  Substance and Sexual Activity  . Alcohol use: Not Currently  . Drug use: No  . Sexual activity: Yes  Other Topics Concern  . 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 Daily walking about 20 min.    Diet: ok water intake 4 glasses/day, daily fruits/vegetables, red meat 4x/wk, fish 3-4x/wk   Social Determinants of Health   Financial Resource Strain: Low Risk   . Difficulty of Paying Living Expenses: Not hard at all  Food Insecurity: No Food Insecurity  . Worried About Charity fundraiser in the Last Year: Never true  . Ran Out of Food in the Last Year: Never true  Transportation Needs: No Transportation Needs  . Lack of Transportation  (Medical): No  . Lack of Transportation (Non-Medical): No  Physical Activity: Inactive  . Days of Exercise per Week: 0 days  . Minutes of Exercise per Session: 0 min  Stress: No Stress Concern Present  . Feeling of Stress : Not at all  Social Connections: Not on file      Review of Systems  Cardiovascular: Negative for leg swelling.    Physical Exam No direct physical exam was performed   Vital Signs: There were no vitals taken for this visit.  Imaging: No results found.  Labs:  CBC: Recent Labs    04/28/20 1229 05/04/20 1052 06/04/20 0848 09/25/20 0922  WBC 6.7 8.6 8.4 5.3  HGB 9.8* 10.6* 11.3* 12.6  HCT 29.8* 31.6* 35.2* 38.0  PLT 147* 208.0 197.0 102.0*    COAGS: Recent Labs    03/06/20 1700 04/22/20 1141 04/25/20 1530  INR 1.0 1.2 1.2  APTT  --  27 29    BMP: Recent Labs    04/25/20 0502 04/26/20 0446 04/27/20 0600 04/28/20 0500 05/04/20 1052 09/25/20 0922 10/18/20 0811 11/13/20 0848 01/08/21 0844  NA 140 137 135 135   < > 138 139 139 139  K 3.2* 4.1 4.0 3.9   < > 4.8 5.1 4.4 4.9  CL 113* 111 107 105   < > 104 108 105 103  CO2 20* 20* 21* 20*   < > 26 24 28 27   GLUCOSE 132* 138* 120* 138*   < > 198* 191* 173* 209*  BUN 10 12 11 13    < > 38* 35* 29* 33*  CALCIUM 8.3* 8.3* 8.4* 8.5*   < > 9.9 9.9 10.0 10.2  CREATININE 0.73 0.85 0.89 0.86   < > 1.50* 1.41* 1.05 1.24*  GFRNONAA >60 >60 >60 >60  --   --   --   --   --   GFRAA >60 >60 >60 >60  --   --   --   --   --    < > = values in this interval not displayed.    LIVER FUNCTION TESTS: Recent Labs    04/26/20 0446 05/04/20 1052 06/04/20 0848 09/25/20 0922 10/18/20 0811 11/13/20 0848  BILITOT 0.6 0.4 0.3 0.6  --   --   AST 11* 9 23 12   --   --   ALT 11 4 15 12   --   --   ALKPHOS 55 85 118* 104  --   --   PROT 4.4* 5.8* 6.1 7.1  --   --   ALBUMIN 2.2* 3.1* 3.3* 4.5 4.5 4.8    TUMOR MARKERS: No results for input(s): AFPTM, CEA, CA199, CHROMGRNA in the last 8760  hours.  Assessment and Plan:  75 year old with a history of multiple medical problems including necrotizing pancreatitis, diabetes and hypertension.  An IVC filter was placed in June 2021 due to right lower extremity DVT because the patient could not be anticoagulated due to anemia and blood loss.  Presumably the DVT was provoked from the patient's necrotizing pancreatitis and her last lower extremity venous duplex in September 2021 was negative for DVT.  We again discussed the pros and cons of the IVC filter.  It is likely that the patient does not need the IVC filter or chronic anticoagulation for the previous DVT but I will consult the patient's primary care physician as well.  Patient understands that there are risks to keeping the IVC filter which include filter migration, filter fracture, damage to adjacent structures and risk of caval thrombosis.  Patient is interested in having the IVC filter removed but she would like to wait another 3 months until her other medical problems can be  treated.  We will plan to see the patient back in 3 months and get a follow-up lower extremity venous duplex to exclude any DVT.  If the patient's primary care physician does not have any concerns, I would anticipate that we could remove the filter if she has no evidence of recurrent DVT.  Again, we will see the patient back in 3 months.   Electronically Signed: Burman Riis 01/15/2021, 2:54 PM   I spent a total of    10 Minutes in remote  clinical consultation, greater than 50% of which was counseling/coordinating care for IVC filter management.    Visit type: Audio only (telephone). Audio (no video) only due to Patient preference. Alternative for in-person consultation at [place], [place], [place], [place]. This visit type was conducted due to national recommendations for restrictions regarding the COVID-19 Pandemic (e.g. social distancing).  This format is felt to be most appropriate for this  patient at this time.  All issues noted in this document were discussed and addressed.  Patient ID: Katherine Oliver, female   DOB: 1945-12-31, 75 y.o.   MRN: [id number]

## 2021-02-05 ENCOUNTER — Other Ambulatory Visit: Payer: Self-pay | Admitting: *Deleted

## 2021-02-05 NOTE — Patient Outreach (Signed)
[facility]) Care Management  02/05/2021  Zalaya Astarita [facility] [birth year] [id number]    Telephone Assessment-Successful-Diabetes   RN spoke with pt today and received an update on her ongoing management of care. Pt states she is doing much better. Reports new glucose device for readings that are noted within the plan of care. Pt more regulated on a SSI increase also on her daily Lantus dosing. Will continue to encouraged adherence along with the review and discussion of the current plan of care. Notations updated accordingly. Pt follow up with [facility] Endocrinologist q 2 monthly and her primary provider q 2-3 months concerning her HTN. Both discussed along with prevention measures and management of care. Encouraged ongoing adherence.   Will follow up next month with pt's ongoing progress and evaluate pt's medication adherence with the increase insulin related to her ongoing readings. Will also follow up on her HTN as it continue to be regulated with the noted ranges.     Goals Addressed            This Visit's Progress   . THN-Disease Progression Prevented or Minimized   On track    Follow up Date 03/07/2021 Timeframe:  Short-Term Goal Priority:  medium Start Date:     11/30/2020                        Expected End Date:     04/02/2021                   Evidence-based guidance:   Tailor lifestyle advice to individual; review progress regularly; give frequent encouragement and respond positively to incremental successes.   Assess for and promote awareness of worsening disease or development of comorbidity.   Prepare patient for laboratory and diagnostic exams based on risk and presentation.   Prepare patient for use of pharmacologic therapy that may include diuretic, beta-blocker, beta-blocker/thiazide combination, angiotensin-converting enzyme inhibitor, renin-angiotensin blocker or calcium-channel blocker.   Expect periodic adjustments to pharmacologic therapy;  manage side effects.   Promote a healthy diet that includes primarily plant-based foods, such as fruits, vegetables, whole grains, beans and legumes, low-fat dairy and lean meats.    Consider moderate reduction in sodium intake by avoiding the addition of salt to prepared foods and limiting processed meats, canned soup, frozen meals and salty snacks.    Review sources of stress; explore current coping strategies and encourage use of mindfulness, yoga, meditation or exercise to manage stress.   Notes:  4/5-Continue to discussion pt's participation in preventive measures related to her ongoing conditions. Pt states she has increased her mobility with more activities and closer monitoring of her diabetes and blood pressures that are improving. Pt aware if consults are needed with other [facility] disciplines for social worker and pharmacy to call this RN case manager directly for a referral and follow up. 3/4- Pt continue to follow this goal and follow the interventions to prevent disease progression and prevent acute events. Pt discussed changes in her dietary habits to accommodate. 1/28-Discussed pt's current disposition with managing both her diabetes and hypertension. Along with the goal and related interventions. Pt with a understanding and the action plan discussed.    . THN-Monitor and Manage My Blood Sugar   On track    Follow Up Date: 03/07/2021 Timeframe:  Short-Term Goal Priority:  Medium Start Date:  10/30/2020  Expected End Date:       04/02/2021                  - check blood sugar at prescribed times - check blood sugar if I feel it is too high or too low - enter blood sugar readings and medication or insulin into daily log - take the blood sugar log to all doctor visits - take the blood sugar meter to all doctor visits    Why is this important?   Checking your blood sugar at home helps to keep it from getting very high or very low.  Writing the results in a diary or  log helps the doctor know how to care for you.  Your blood sugar log should have the time, date and the results.  Also, write down the amount of insulin or other medicine that you take.  Other information, like what you ate, exercise done and how you were feeling, will also be helpful.     Notes:   4/5-Discussed pt has a follow up every 2 months with her primary provider who is assisting pt in managing her HTN. Pt reports improved ranges from 120-130/70 over the last month. Will continue to encouraged daily monitoring.  3/4-Discussed ongoing management of care as pt states she is doing better with her Last A1c last week at 7.1 from 8 several months ago. Pt states she also has a dietitian that call 1-2 X monthly to discuss ways to improve her ongoing diabetes. Fasting glucose read lingers around 129 with the lowest at 90 over the last month. Pt aware of what to do if acute readings are encountered.  1/28-Pt reports readings from 153-183 as she continues to manage. Repeat A1C in Feb and pt will follow up with her Endocrinologist at that time. Pt verified she has changed her dietary habits to reflect improved readings and will continue to work on managing her diabetes however her focus has been on managing her HTN due to the recent change. Strongly encourage adherence with the ongoing plan of care related. Dec-Pt continue to work with her dietary habits high protein, low carbohydrate. Recent A1c 8.9 Nov treated with increased insulin doses. Reports reading have improved after medication changes (143-195). Will extend this goal to allow adherence with the new increased insulin dosages.     . THN-Set My Target A1C   On track    Follow Up Date 03/07/2021 Timeframe:  Long-Range Goal Priority:  Medium Start Date:    10/30/2020                   Expected End Date:     05/31/2021                               - set target A1C    Why is this important?   Your target A1C is decided together by you and your  doctor.  It is based on several things like your age and other health issues.    Notes:  4/5-Pt reports most recent A1C 2/23 at 7.4 much improved from 8.9 and she has recently increased her insulin to accommodate her elevated readings that were over 200 now 168-199. Pt has a new Dexcom for closer monitoring up to 5X daily that alerts her with increases and when she is to elevated prior to her meals. Pt also has changes with her insulin SS. Will encouraged adherence  to all the recent changes to improve her ongoing diabetes management of care. 1/28-Pending repeat A1C the last of Feb and follow up with his endocrinologist. Currently reports readings for CBG from 153-183 asymptomatic. Continue to working on improving her A1c  Dec-A1C 8.9 09/22/2020-insulin increased with daily glucose readings improved 143-195. Will continue to educate on dietary habits and reducing carbohydrates       [name], RN Care Management Coordinator [place] Office [phone]

## 2021-02-20 ENCOUNTER — Other Ambulatory Visit: Payer: Self-pay | Admitting: Family Medicine

## 2021-02-23 ENCOUNTER — Other Ambulatory Visit: Payer: Self-pay | Admitting: Family Medicine

## 2021-03-07 ENCOUNTER — Other Ambulatory Visit: Payer: Self-pay | Admitting: *Deleted

## 2021-03-07 NOTE — Patient Outreach (Signed)
[facility]) Care Management  03/07/2021  Nickola Lenig [facility] May 22, 1946 [id number]   Telephone Assessment-Unsuccessful   RN attempted outreach call today however unsuccessful. RN able to leave a HIPAA approved voice message requesting a call back.  Will follow up next week with another outreach call.  [name], RN Care Management Coordinator [place] Office [phone]

## 2021-03-07 NOTE — Patient Outreach (Signed)
[name]) Care Management  03/07/2021  Katherine Oliver [facility] 27-Jun-1946 [id number]  Telephone Assessment-Successful-Diabetes  Spoke with pt and received an update on pt's ongoing management of care. Pt states she continue to use her Dexcom and recent changed the sensors. States her fasting blood glucose readings range from 160-220. States she will follow up with her [facility] provider the end of May and may have a A1C completed if not will request from her primary provided on the 6/1 appointment. Continue discussion on high protein and low carbohydrates to improve her ongoing readings. Pt continue to try and aware there is room for improvement. Pt reports other comorbidities are under control with her blood pressures reads from 120-130/70 with no reported issues or problems.   Plan of care reviewed, discussed along with all goals and interventions. Top barrier remains with pt's self determination and behaviors to improve her overall glucose levels. Offered Corporate investment banker and will continue education accordingly. No other inquires as all questions addressed.  Will follow up next month after all provider visits for possible updated A1C. Will continue to encouraged adherence with all discussed today. Note all updates with updated documentation with the plan of care.     Goals Addressed            This Visit's Progress   . THN-Disease Progression Prevented or Minimized   On track    Follow up Date 04/04/2021 Timeframe:  Short-Term Goal Priority:  medium Start Date:     11/30/2020                        Expected End Date:     05/02/2021                  Barriers: Health Behaviors Knowledge  Evidence-based guidance:   Tailor lifestyle advice to individual; review progress regularly; give frequent encouragement and respond positively to incremental successes.   Assess for and promote awareness of worsening disease or development of comorbidity.    Prepare patient for laboratory and diagnostic exams based on risk and presentation.   Prepare patient for use of pharmacologic therapy that may include diuretic, Promote a healthy diet that includes primarily plant-based foods, such as fruits, vegetables, whole grains, beans and legumes, low-fat dairy and lean meats.    Consider moderate reduction in sodium intake by avoiding the addition of salt to prepared foods and limiting processed meats, canned soup, frozen meals and salty snacks.    Review sources of stress; explore current coping strategies and encourage use of mindfulness, yoga, meditation or exercise to manage stress.   Notes:  5/5-Discussed ongoing prevention measure in minimizing acute events or related episodes of disease progression. Will verified pt continue to consume a diabetic diet with high proteins and low carbohydrates.  Will verify pt attendance to all her scheduled medical appointments with no delays. 4/5-Continue to discussion pt's participation in preventive measures related to her ongoing conditions. Pt states she has increased her mobility with more activities and closer monitoring of her diabetes and blood pressures that are improving. Pt aware if consults are needed with other [facility] disciplines for social worker and pharmacy to call this RN case manager directly for a referral and follow up. 3/4- Pt continue to follow this goal and follow the interventions to prevent disease progression and prevent acute events. Pt discussed changes in her dietary habits to accommodate. 1/28-Discussed pt's current disposition with managing both her diabetes and hypertension.  Along with the goal and related interventions. Pt with a understanding and the action plan discussed.    . THN-Monitor and Manage My Blood Sugar   On track    Follow Up Date: 04/04/2021 Timeframe:  Short-Term Goal Priority:  Medium Start Date:  10/30/2020                     Expected End Date:       05/02/2021                  Barriers: Health Behaviors  - check blood sugar at prescribed times - check blood sugar if I feel it is too high or too low - enter blood sugar readings and medication or insulin into daily log - take the blood sugar log to all doctor visits - take the blood sugar meter to all doctor visits    Why is this important?   Checking your blood sugar at home helps to keep it from getting very high or very low.  Writing the results in a diary or log helps the doctor know how to care for you.  Your blood sugar log should have the time, date and the results.  Also, write down the amount of insulin or other medicine that you take.  Other information, like what you ate, exercise done and how you were feeling, will also be helpful.     Notes:   5/5- Pt reports she continues to do well with her Dexcom readings from 160-220. Reports upcoming appointment end of May and will requested an updated A1C while at [facility] if not has a pending appointment with her primary provider on 6/1 and will request the lab at that time.  4/5-Discussed pt has a follow up every 2 months with her primary provider who is assisting pt in managing her HTN. Pt reports improved ranges from 120-130/70 over the last month. Will continue to encouraged daily monitoring.  3/4-Discussed ongoing management of care as pt states she is doing better with her Last A1c last week at 7.1 from 8 several months ago. Pt states she also has a dietitian that call 1-2 X monthly to discuss ways to improve her ongoing diabetes. Fasting glucose read lingers around 129 with the lowest at 90 over the last month. Pt aware of what to do if acute readings are encountered.  1/28-Pt reports readings from 153-183 as she continues to manage. Repeat A1C in Feb and pt will follow up with her Endocrinologist at that time. Pt verified she has changed her dietary habits to reflect improved readings and will continue to work on managing her diabetes however her focus  has been on managing her HTN due to the recent change. Strongly encourage adherence with the ongoing plan of care related. Dec-Pt continue to work with her dietary habits high protein, low carbohydrate. Recent A1c 8.9 Nov treated with increased insulin doses. Reports reading have improved after medication changes (143-195). Will extend this goal to allow adherence with the new increased insulin dosages.     . THN-Set My Target A1C   On track    Follow Up Date 04/04/2021 Timeframe:  Long-Range Goal Priority:  Medium Start Date:    10/30/2020                   Expected End Date:     05/31/2021  Barriers: Health Behaviors  - set target A1C    Why is this important?   Your target A1C is decided together by you and your doctor.  It is based on several things like your age and other health issues.    Notes:  4/5-Pt reports most recent A1C 2/23 at 7.4 much improved from 8.9 and she has recently increased her insulin to accommodate her elevated readings that were over 200 now 168-199. Pt has a new Dexcom for closer monitoring up to 5X daily that alerts her with increases and when she is to elevated prior to her meals. Pt also has changes with her insulin SS. Will encouraged adherence to all the recent changes to improve her ongoing diabetes management of care. 1/28-Pending repeat A1C the last of Feb and follow up with his endocrinologist. Currently reports readings for CBG from 153-183 asymptomatic. Continue to working on improving her A1c  Dec-A1C 8.9 09/22/2020-insulin increased with daily glucose readings improved 143-195. Will continue to educate on dietary habits and reducing carbohydrates       [name], RN Care Management Coordinator [place] Office [phone]

## 2021-03-12 ENCOUNTER — Ambulatory Visit: Payer: Self-pay | Admitting: *Deleted

## 2021-03-17 ENCOUNTER — Other Ambulatory Visit: Payer: Self-pay | Admitting: Family Medicine

## 2021-03-21 DIAGNOSIS — H02035 Senile entropion of left lower eyelid: Secondary | ICD-10-CM | POA: Diagnosis not present

## 2021-03-21 DIAGNOSIS — H16213 Exposure keratoconjunctivitis, bilateral: Secondary | ICD-10-CM | POA: Diagnosis not present

## 2021-03-21 DIAGNOSIS — H02032 Senile entropion of right lower eyelid: Secondary | ICD-10-CM | POA: Diagnosis not present

## 2021-03-26 DIAGNOSIS — E139 Other specified diabetes mellitus without complications: Secondary | ICD-10-CM | POA: Diagnosis not present

## 2021-03-26 DIAGNOSIS — S36209S Unspecified injury of unspecified part of pancreas, sequela: Secondary | ICD-10-CM | POA: Diagnosis not present

## 2021-03-26 LAB — HEMOGLOBIN A1C: Hemoglobin A1C: 7

## 2021-04-03 ENCOUNTER — Ambulatory Visit (INDEPENDENT_AMBULATORY_CARE_PROVIDER_SITE_OTHER): Payer: Medicare Other | Admitting: Family Medicine

## 2021-04-03 ENCOUNTER — Other Ambulatory Visit: Payer: Self-pay

## 2021-04-03 ENCOUNTER — Encounter: Payer: Self-pay | Admitting: Family Medicine

## 2021-04-03 VITALS — BP 136/80 | HR 69 | Temp 98.0°F | Ht 61.0 in | Wt 149.1 lb

## 2021-04-03 DIAGNOSIS — E118 Type 2 diabetes mellitus with unspecified complications: Secondary | ICD-10-CM

## 2021-04-03 DIAGNOSIS — I10 Essential (primary) hypertension: Secondary | ICD-10-CM

## 2021-04-03 DIAGNOSIS — H02002 Unspecified entropion of right lower eyelid: Secondary | ICD-10-CM | POA: Insufficient documentation

## 2021-04-03 DIAGNOSIS — Z794 Long term (current) use of insulin: Secondary | ICD-10-CM

## 2021-04-03 DIAGNOSIS — H02005 Unspecified entropion of left lower eyelid: Secondary | ICD-10-CM | POA: Diagnosis not present

## 2021-04-03 MED ORDER — METOPROLOL SUCCINATE ER 50 MG PO TB24: 50.0000 mg | ORAL_TABLET | Freq: Every day | ORAL | 1 refills | Status: DC

## 2021-04-03 MED ORDER — BENAZEPRIL-HYDROCHLOROTHIAZIDE 20-12.5 MG PO TABS: 1.0000 | ORAL_TABLET | Freq: Every day | ORAL | 1 refills | Status: DC

## 2021-04-03 MED ORDER — AMLODIPINE BESYLATE 10 MG PO TABS: 10.0000 mg | ORAL_TABLET | Freq: Every day | ORAL | 1 refills | Status: DC

## 2021-04-03 NOTE — Patient Instructions (Addendum)
You are doing well today  Continue current blood pressure regimen. I have sent combo pill benazepril hydrochlorothiazide 20/12.5mg  to take once daily in place of individual components - call [facility] to price this out today.  Return in 6 months for wellness visit.

## 2021-04-03 NOTE — Assessment & Plan Note (Signed)
Appreciate endo care. A1c down to 7%

## 2021-04-03 NOTE — Progress Notes (Signed)
Patient ID: Katherine Oliver, female    DOB: 09-17-1946, 75 y.o.   MRN: [id number]  This visit was conducted in person.  BP 136/80   Pulse 69   Temp 98 F (36.7 C) (Temporal)   Ht 5\' 1"  (1.549 m)   Wt 149 lb 1 oz (67.6 kg)   SpO2 96%   BMI 28.17 kg/m    CC: HTN f/u visit  Subjective:   HPI: Katherine Oliver is a 75 y.o. female presenting on 04/03/2021 for Hypertension (Here for 3 mo f/u.)   DM followed by endocrinology, seen last week (Dr Lacinda Axon), note reviewed - continues humalog with meals and lantus at bedtime. Finally got Dexcom CGM. Latest A1c 7%.   HTN - Compliant with current antihypertensive regimen of amlodipine 10mg  daily, benazepril 20mg  daily, hctz 12.5mg  daily *new, and toprol XL 50mg  daily. Does check blood pressures at home and brings great log: 110-140/60-80s. No low blood pressure readings or symptoms of dizziness/syncope. Denies HA, vision changes, CP/tightness, SOB, leg swelling.   Upcoming eye surgery 04/10/2021 for lower eyelid entropion     Relevant past medical, surgical, family and social history reviewed and updated as indicated. Interim medical history since our last visit reviewed. Allergies and medications reviewed and updated. Outpatient Medications Prior to Visit  Medication Sig Dispense Refill  . acetaminophen (TYLENOL) 325 MG tablet Take 650 mg by mouth every 6 (six) hours as needed for mild pain.    . B-D UF III MINI PEN NEEDLES 31G X 5 MM MISC Inject into the skin 4 (four) times daily.    Marland Kitchen conjugated estrogens (PREMARIN) vaginal cream Estrogen Cream Instruction Discard applicator Apply pea sized amount to tip of finger to urethra before bed. Wash hands well after application. Use Monday, Wednesday and Friday 42.5 g 12  . Continuous Blood Gluc Receiver (DEXCOM G6 RECEIVER) [place] 1 dexcom receiver    . Continuous Blood Gluc Sensor (DEXCOM G6 SENSOR) MISC Dispense 1 box Dexcom G6 sensors (3 sensors per box)    .  Continuous Blood Gluc Transmit (DEXCOM G6 TRANSMITTER) MISC Dispense Dexcom G6 Transmitter    . CREON 36000-114000 units CPEP capsule SMARTSIG:Capsule(s) By Mouth As Directed    . ferrous sulfate 325 (65 FE) MG tablet Take 1 tablet (325 mg total) by mouth daily with breakfast. 90 tablet 3  . folic acid (FOLVITE) 1 MG tablet Take 1 tablet (1 mg total) by mouth daily. 90 tablet 3  . glucose blood (ACCU-CHEK AVIVA PLUS) test strip Use as instructed to check blood sugar 4 times daily. 400 strip 3  . insulin lispro (HUMALOG) 100 UNIT/ML injection Inject 0.1 mLs (10 Units total) into the skin 3 (three) times daily before meals. Plus sliding scale    . LANTUS SOLOSTAR 100 UNIT/ML Solostar Pen Inject 22 Units into the skin at bedtime.    Marland Kitchen loperamide (IMODIUM A-D) 2 MG tablet Take 2 mg by mouth 4 (four) times daily as needed for diarrhea or loose stools.    . meclizine (ANTIVERT) 25 MG tablet Take 1 tablet (25 mg total) by mouth 3 (three) times daily as needed for nausea. 30 tablet 0  . pantoprazole (PROTONIX) 40 MG tablet Take 1 tablet (40 mg total) by mouth daily. 90 tablet 3  . rosuvastatin (CRESTOR) 10 MG tablet Take 1 tablet by mouth daily.    Marland Kitchen amLODipine (NORVASC) 10 MG tablet Take 1 tablet (10 mg total) by mouth daily. 90 tablet 1  . benazepril (  LOTENSIN) 20 MG tablet Take 1 tablet (20 mg total) by mouth daily. 90 tablet 1  . hydrochlorothiazide (MICROZIDE) 12.5 MG capsule TAKE 1 CAPSULE BY MOUTH EVERY DAY 90 capsule 1  . metoprolol succinate (TOPROL XL) 50 MG 24 hr tablet Take 1 tablet (50 mg total) by mouth daily. 90 tablet 1  . Cholecalciferol (VITAMIN D3) 25 MCG (1000 UT) CAPS Take 1 capsule (1,000 Units total) by mouth daily. (Patient not taking: Reported on 04/03/2021) 30 capsule   . Multiple Vitamin (MULTIVITAMIN) tablet 1 tablet by Per J Tube route daily.  (Patient not taking: Reported on 04/03/2021)     No facility-administered medications prior to visit.     Per HPI unless specifically  indicated in ROS section below Review of Systems Objective:  BP 136/80   Pulse 69   Temp 98 F (36.7 C) (Temporal)   Ht 5\' 1"  (1.549 m)   Wt 149 lb 1 oz (67.6 kg)   SpO2 96%   BMI 28.17 kg/m   Wt Readings from Last 3 Encounters:  04/03/21 149 lb 1 oz (67.6 kg)  01/01/21 142 lb 8 oz (64.6 kg)  12/12/20 137 lb (62.1 kg)      Physical Exam Vitals and nursing note reviewed.  Constitutional:      Appearance: Normal appearance. She is not ill-appearing.  Cardiovascular:     Rate and Rhythm: Normal rate and regular rhythm.     Pulses: Normal pulses.     Heart sounds: Normal heart sounds. No murmur heard.   Pulmonary:     Effort: Pulmonary effort is normal. No respiratory distress.     Breath sounds: Normal breath sounds. No wheezing, rhonchi or rales.  Neurological:     Mental Status: She is alert.  Psychiatric:        Mood and Affect: Mood normal.        Behavior: Behavior normal.       Results for orders placed or performed in visit on 04/03/21  Hemoglobin A1c  Result Value Ref Range   Hemoglobin A1C 7    Assessment & Plan:  This visit occurred during the SARS-CoV-2 public health emergency.  Safety protocols were in place, including screening questions prior to the visit, additional usage of staff PPE, and extensive cleaning of exam room while observing appropriate contact time as indicated for disinfecting solutions.   Problem List Items Addressed This Visit    Controlled diabetes mellitus type 2 with complications ([place])    Appreciate endo care. A1c down to 7%      Relevant Medications   benazepril-hydrochlorthiazide (LOTENSIN HCT) 20-12.5 MG tablet   Accelerated hypertension - Primary    Chronic, stable. Levels have normalized on current regimen - continue this. Will send in combo benazepril hctz for pt to price out.       Relevant Medications   amLODipine (NORVASC) 10 MG tablet   metoprolol succinate (TOPROL XL) 50 MG 24 hr tablet    benazepril-hydrochlorthiazide (LOTENSIN HCT) 20-12.5 MG tablet   Entropion of both lower eyelids    Pending eye surgery next week.           Meds ordered this encounter  Medications  . amLODipine (NORVASC) 10 MG tablet    Sig: Take 1 tablet (10 mg total) by mouth daily.    Dispense:  90 tablet    Refill:  1  . metoprolol succinate (TOPROL XL) 50 MG 24 hr tablet    Sig: Take 1 tablet (50 mg total)  by mouth daily.    Dispense:  90 tablet    Refill:  1  . benazepril-hydrochlorthiazide (LOTENSIN HCT) 20-12.5 MG tablet    Sig: Take 1 tablet by mouth daily.    Dispense:  90 tablet    Refill:  1    To price out vs individual components   Orders Placed This Encounter  Procedures  . Hemoglobin A1c    This external order was created through the Results Console.    Patient Instructions  You are doing well today  Continue current blood pressure regimen. I have sent combo pill benazepril hydrochlorothiazide 20/12.5mg  to take once daily in place of individual components - call [facility] to price this out today.  Return in 6 months for wellness visit.   Follow up plan: Return in about 6 months (around 10/03/2021) for annual exam, prior fasting for blood work, medicare wellness visit.  [name], MD

## 2021-04-03 NOTE — Assessment & Plan Note (Signed)
Pending eye surgery next week.

## 2021-04-03 NOTE — Assessment & Plan Note (Signed)
Chronic, stable. Levels have normalized on current regimen - continue this. Will send in combo benazepril hctz for pt to price out.

## 2021-04-04 ENCOUNTER — Other Ambulatory Visit: Payer: Self-pay | Admitting: *Deleted

## 2021-04-04 NOTE — Patient Outreach (Addendum)
[facility]) Care Management  04/04/2021  Katherine Oliver [facility] 02-12-1946 [id number]   Telephone Assessment-Successful-Diabetes  Spoke with pt today and received an update on her ongoing management of care. Discussed with review of the plan of care with noted updates accordingly. Will follow up next month on pt's progress and address accordingly.  Will also update pt's provider this month for quarterly. Address all inquires and questions with no additional needs. Pt on track with her ongoing plan of care.   Goals Addressed            This Visit's Progress   . THN-Disease Progression Prevented or Minimized   On track    Follow up Date 05/03/2021 Timeframe:  Short-Term Goal Priority:  medium Start Date:     11/30/2020                        Expected End Date:     05/31/2021                  Barriers: Health Behaviors Knowledge  Evidence-based guidance:   Tailor lifestyle advice to individual; review progress regularly; give frequent encouragement and respond positively to incremental successes.   Assess for and promote awareness of worsening disease or development of comorbidity.   Prepare patient for laboratory and diagnostic exams based on risk and presentation.   Prepare patient for use of pharmacologic therapy that may include diuretic, Promote a healthy diet that includes primarily plant-based foods, such as fruits, vegetables, whole grains, beans and legumes, low-fat dairy and lean meats.    Consider moderate reduction in sodium intake by avoiding the addition of salt to prepared foods and limiting processed meats, canned soup, frozen meals and salty snacks.    Review sources of stress; explore current coping strategies and encourage use of mindfulness, yoga, meditation or exercise to manage stress.   Notes:  6/2-Will continue to discussed prevention measures that minimize acute events and/or episodes of disease progression. Will continue to verify  adherence with the discussed dietary measures as mentioned above and encouraged adherence. Pt remains adherent with her ongoing medication to prevent edema/swelling via her HF with a steady weight with no swelling.   5/5-Discussed ongoing prevention measure in minimizing acute events or related episodes of disease progression. Will verified pt continue to consume a diabetic diet with high proteins and low carbohydrates.  Will verify pt attendance to all her scheduled medical appointments with no delays. 4/5-Continue to discussion pt's participation in preventive measures related to her ongoing conditions. Pt states she has increased her mobility with more activities and closer monitoring of her diabetes and blood pressures that are improving. Pt aware if consults are needed with other [facility] disciplines for social worker and pharmacy to call this RN case manager directly for a referral and follow up. 3/4- Pt continue to follow this goal and follow the interventions to prevent disease progression and prevent acute events. Pt discussed changes in her dietary habits to accommodate. 1/28-Discussed pt's current disposition with managing both her diabetes and hypertension. Along with the goal and related interventions. Pt with a understanding and the action plan discussed.    . THN-Monitor and Manage My Blood Sugar   On track    Follow Up Date:05/03/2021 Timeframe:  Short-Term Goal Priority:  Medium Start Date:  10/30/2020                     Expected End Date:  05/31/2021                 Barriers: Health Behaviors  - check blood sugar at prescribed times - check blood sugar if I feel it is too high or too low - enter blood sugar readings and medication or insulin into daily log - take the blood sugar log to all doctor visits - take the blood sugar meter to all doctor visits    Why is this important?   Checking your blood sugar at home helps to keep it from getting very high or very low.  Writing the  results in a diary or log helps the doctor know how to care for you.  Your blood sugar log should have the time, date and the results.  Also, write down the amount of insulin or other medicine that you take.  Other information, like what you ate, exercise done and how you were feeling, will also be helpful.     Notes:  6/2-Pt continue to reports she is doing well with better readings from her Dexcom 150-180 and her most recent A1C was 7.0 from 7.4 (next f/u on Sept). Will continue to encourage adherence with managing her diabetes.  5/5- Pt reports she continues to do well with her Dexcom readings from 160-220. Reports upcoming appointment end of May and will requested an updated A1C while at [facility] if not has a pending appointment with her primary provider on 6/1 and will request the lab at that time.  4/5-Discussed pt has a follow up every 2 months with her primary provider who is assisting pt in managing her HTN. Pt reports improved ranges from 120-130/70 over the last month. Will continue to encouraged daily monitoring.  3/4-Discussed ongoing management of care as pt states she is doing better with her Last A1c last week at 7.1 from 8 several months ago. Pt states she also has a dietitian that call 1-2 X monthly to discuss ways to improve her ongoing diabetes. Fasting glucose read lingers around 129 with the lowest at 90 over the last month. Pt aware of what to do if acute readings are encountered.  1/28-Pt reports readings from 153-183 as she continues to manage. Repeat A1C in Feb and pt will follow up with her Endocrinologist at that time. Pt verified she has changed her dietary habits to reflect improved readings and will continue to work on managing her diabetes however her focus has been on managing her HTN due to the recent change. Strongly encourage adherence with the ongoing plan of care related. Dec-Pt continue to work with her dietary habits high protein, low carbohydrate. Recent A1c 8.9 Nov  treated with increased insulin doses. Reports reading have improved after medication changes (143-195). Will extend this goal to allow adherence with the new increased insulin dosages.     . THN-Set My Target A1C   On track    Follow Up Date 05/03/2021 Timeframe:  Long-Range Goal Priority:  Medium Start Date:    10/30/2020                   Expected End Date:     05/31/2021                              Barriers: Health Behaviors  - set target A1C    Why is this important?   Your target A1C is decided together by you and your doctor.  It is  based on several things like your age and other health issues.    Notes:  4/5-Pt reports most recent A1C 2/23 at 7.4 much improved from 8.9 and she has recently increased her insulin to accommodate her elevated readings that were over 200 now 168-199. Pt has a new Dexcom for closer monitoring up to 5X daily that alerts her with increases and when she is to elevated prior to her meals. Pt also has changes with her insulin SS. Will encouraged adherence to all the recent changes to improve her ongoing diabetes management of care. 1/28-Pending repeat A1C the last of Feb and follow up with his endocrinologist. Currently reports readings for CBG from 153-183 asymptomatic. Continue to working on improving her A1c  Dec-A1C 8.9 09/22/2020-insulin increased with daily glucose readings improved 143-195. Will continue to educate on dietary habits and reducing carbohydrates       [name], RN Care Management Coordinator [place] Office [phone]

## 2021-04-10 DIAGNOSIS — H02035 Senile entropion of left lower eyelid: Secondary | ICD-10-CM | POA: Diagnosis not present

## 2021-04-10 DIAGNOSIS — H16213 Exposure keratoconjunctivitis, bilateral: Secondary | ICD-10-CM | POA: Diagnosis not present

## 2021-04-10 DIAGNOSIS — H02032 Senile entropion of right lower eyelid: Secondary | ICD-10-CM | POA: Diagnosis not present

## 2021-04-10 DIAGNOSIS — H16202 Unspecified keratoconjunctivitis, left eye: Secondary | ICD-10-CM | POA: Diagnosis not present

## 2021-04-30 ENCOUNTER — Telehealth: Payer: Self-pay

## 2021-04-30 DIAGNOSIS — Z95828 Presence of other vascular implants and grafts: Secondary | ICD-10-CM

## 2021-04-30 NOTE — Telephone Encounter (Signed)
Triage vm from [place] of [place].  States Dr. Anselm Pancoast placed IVC filter and wants to remove it.  He wants to know if Dr. Darnell Level has any concerns.  Plz advise [place] at [phone].  Gives permission to leave vm.  [place] is aware Dr. Darnell Level is out of office.

## 2021-04-30 NOTE — Telephone Encounter (Addendum)
Ok to remove filter - thank you!

## 2021-04-30 NOTE — Telephone Encounter (Signed)
Spoke with [place] relaying Dr. Synthia Innocent message.  Verbalizes understanding and will let Dr. Anselm Pancoast know.

## 2021-05-07 ENCOUNTER — Other Ambulatory Visit: Payer: Self-pay | Admitting: *Deleted

## 2021-05-07 NOTE — Patient Outreach (Signed)
[place] [facility]) Care Management  05/07/2021  Lizanne Erker [facility] 1945/12/31 [id number]   Telephone Assessment  Covering for [place] -[name]   Subjective; Unsuccessful outreach call to patient, no answer able to leave a HIPAA compliant voice mail message for return call.   Plan Will await return call if no response, will plan return call in the next week.    Joylene Draft, RN, BSN  [place] Management Coordinator  [phone]- Mobile [phone]- Toll Free Main Office

## 2021-05-11 ENCOUNTER — Other Ambulatory Visit: Payer: Self-pay | Admitting: Family Medicine

## 2021-05-15 ENCOUNTER — Other Ambulatory Visit: Payer: Self-pay | Admitting: *Deleted

## 2021-05-15 NOTE — Patient Outreach (Signed)
[place] [facility]) Care Management  05/15/2021  Aviva Wolfer [facility] 18-Jun-1946 [id number]   Telephone Assessment Covering for [place] -[name]     Subjective; Unsuccessful outreach call to patient, no answer able to leave a HIPAA compliant voice mail message for return call.   Plan Will await return call if no response for monthly outreach. Will follow up with assigned care coordinator for next outreach .      Joylene Draft, RN, BSN [place] Management Coordinator [phone]- Mobile [phone]- Toll Free Main Office

## 2021-05-16 ENCOUNTER — Other Ambulatory Visit: Payer: Self-pay | Admitting: *Deleted

## 2021-05-16 NOTE — Patient Outreach (Signed)
[facility]) Care Management  05/16/2021  Berta Denson [facility] September 15, 1946 [id number]     Telephone Assessment Covering for [place] -[name]  Subjective: Incoming return call from patient , she discussed that she is doing okay. She discussed update on her care regarding medical condition management .  Patient discussed getting close to needing refill on [place], reviewed usual process of notifying Pharmacy to make contact with provider office. 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   Patient discussed being pleased with having Dexcom and being more comfortable with use.   Patient discussed having IVC filter in place and understands Dr. Laurell Josephs radiology wants to remove. Patient discussed not having direct number for Dr. Anselm Pancoast but has [facility] contact number. Noted communication note from [place] at [facility], Placed call while on phone with patient able to leave a message for return call to patient regarding IVC filter .     Goals Addressed             This Visit's Progress    THN-Disease Progression Prevented or Minimized   On track    Follow up Date 06/04/21 Timeframe:  Short-Term Goal Priority:  medium Start Date:     11/30/2020                        Expected End Date:     05/31/2021                  Barriers: Health Behaviors Knowledge  Evidence-based guidance:  Tailor lifestyle advice to individual; review progress regularly; give frequent encouragement and respond positively to incremental successes.  Assess for and promote awareness of worsening disease or development of comorbidity.  Prepare patient for laboratory and diagnostic exams based on risk and presentation.  Prepare patient for use of pharmacologic therapy that may include diuretic, Promote a healthy diet that  includes primarily plant-based foods, such as fruits, vegetables, whole grains, beans and legumes, low-fat dairy and lean meats.   Consider moderate reduction in sodium intake by avoiding the addition of salt to prepared foods and limiting processed meats, canned soup, frozen meals and salty snacks.   Review sources of stress; explore current coping strategies and encourage use of mindfulness, yoga, meditation or exercise to manage stress.   Notes:  7/14  Denies worsening symptoms of heart failure no sudden weight gain. Verified continued adherence to modification in diet and taking medications as prescribed.  6/2-Will continue to discussed prevention measures that minimize acute events and/or episodes of disease progression. Will continue to verify adherence with the discussed dietary measures as mentioned above and encouraged adherence. Pt remains adherent with her ongoing medication to prevent edema/swelling via her HF with a steady weight with no swelling.   5/5-Discussed ongoing prevention measure in minimizing acute events or related episodes of disease progression. Will verified pt continue to consume a diabetic diet with high proteins and low carbohydrates.  Will verify pt attendance to all her scheduled medical appointments with no delays. 4/5-Continue to discussion pt's participation in preventive measures related to her ongoing conditions. Pt states she has increased her mobility with more activities and closer monitoring of her diabetes and blood pressures that are improving. Pt aware if consults are needed with other [facility] disciplines for social worker and pharmacy to call this RN case manager directly for a  referral and follow up. 3/4- Pt continue to follow this goal and follow the interventions to prevent disease progression and prevent acute events. Pt discussed changes in her dietary habits to accommodate. 1/28-Discussed pt's current disposition with managing both her diabetes and  hypertension. Along with the goal and related interventions. Pt with a understanding and the action plan discussed.     THN-Monitor and Manage My Blood Sugar   On track    Follow Up Date:06/03/2021 Timeframe:  Short-Term Goal Priority:  Medium Start Date:  10/30/2020                     Expected End Date:       05/31/2021                 Barriers: Health Behaviors  - check blood sugar at prescribed times - check blood sugar if I feel it is too high or too low - enter blood sugar readings and medication or insulin into daily log - take the blood sugar log to all doctor visits - take the blood sugar meter to all doctor visits    Why is this important?   Checking your blood sugar at home helps to keep it from getting very high or very low.  Writing the results in a diary or log helps the doctor know how to care for you.  Your blood sugar log should have the time, date and the results.  Also, write down the amount of insulin or other medicine that you take.  Other information, like what you ate, exercise done and how you were feeling, will also be helpful.     Notes:  7/14Praised patient for use of Dexcom , changing transmitter for the first time and seeking tech support for questions. She discussed blood sugar reading 100-200 range mostly, one reading of 300 after changing transmitter she followed up with finger stick and had lower reading.  6/2-Pt continue to reports she is doing well with better readings from her Dexcom 150-180 and her most recent A1C was 7.0 from 7.4 (next f/u on Sept). Will continue to encourage adherence with managing her diabetes.  5/5- Pt reports she continues to do well with her Dexcom readings from 160-220. Reports upcoming appointment end of May and will requested an updated A1C while at [facility] if not has a pending appointment with her primary provider on 6/1 and will request the lab at that time.  4/5-Discussed pt has a follow up every 2 months with her primary  provider who is assisting pt in managing her HTN. Pt reports improved ranges from 120-130/70 over the last month. Will continue to encouraged daily monitoring.  3/4-Discussed ongoing management of care as pt states she is doing better with her Last A1c last week at 7.1 from 8 several months ago. Pt states she also has a dietitian that call 1-2 X monthly to discuss ways to improve her ongoing diabetes. Fasting glucose read lingers around 129 with the lowest at 90 over the last month. Pt aware of what to do if acute readings are encountered.  1/28-Pt reports readings from 153-183 as she continues to manage. Repeat A1C in Feb and pt will follow up with her Endocrinologist at that time. Pt verified she has changed her dietary habits to reflect improved readings and will continue to work on managing her diabetes however her focus has been on managing her HTN due to the recent change. Strongly encourage adherence with the ongoing plan  of care related. Dec-Pt continue to work with her dietary habits high protein, low carbohydrate. Recent A1c 8.9 Nov treated with increased insulin doses. Reports reading have improved after medication changes (143-195). Will extend this goal to allow adherence with the new increased insulin dosages.      THN-Set My Target A1C   On track    Follow Up Date 8/1//2022 Timeframe:  Long-Range Goal Priority:  Medium Start Date:    10/30/2020                   Expected End Date:     05/31/2021                              Barriers: Health Behaviors  - set target A1C    Why is this important?   Your target A1C is decided together by you and your doctor.  It is based on several things like your age and other health issues.    Notes:  05/16/21 Reviewed recent A1c 7.0 on 03/26/21  in control range.  4/5-Pt reports most recent A1C 2/23 at 7.4 much improved from 8.9 and she has recently increased her insulin to accommodate her elevated readings that were over 200 now 168-199. Pt has a  new Dexcom for closer monitoring up to 5X daily that alerts her with increases and when she is to elevated prior to her meals. Pt also has changes with her insulin SS. Will encouraged adherence to all the recent changes to improve her ongoing diabetes management of care. 1/28-Pending repeat A1C the last of Feb and follow up with his endocrinologist. Currently reports readings for CBG from 153-183 asymptomatic. Continue to working on improving her A1c  Dec-A1C 8.9 09/22/2020-insulin increased with daily glucose readings improved 143-195. Will continue to educate on dietary habits and reducing carbohydrates       Plan Patient agreeable to follow up in the next month, and encouraged to make contact sooner if new concerns .  Discussed with patient [facility] care manager is embedded at her PCP practice  at [facility] and she may make contact with her for ongoing chronic care management program patient voiced understanding.   Joylene Draft, RN, BSN  [place] Management Coordinator  [phone]- Mobile [phone]- Toll Free Main Office

## 2021-05-17 ENCOUNTER — Other Ambulatory Visit: Payer: Self-pay | Admitting: Diagnostic Radiology

## 2021-05-17 DIAGNOSIS — Z95828 Presence of other vascular implants and grafts: Secondary | ICD-10-CM

## 2021-06-13 ENCOUNTER — Ambulatory Visit: Payer: Self-pay | Admitting: Urology

## 2021-06-19 ENCOUNTER — Ambulatory Visit
Admission: RE | Admit: 2021-06-19 | Discharge: 2021-06-19 | Disposition: A | Discharge status: Home / Self Care | Payer: Medicare Other | Source: Ambulatory Visit | Attending: Diagnostic Radiology | Admitting: Diagnostic Radiology

## 2021-06-19 ENCOUNTER — Other Ambulatory Visit: Payer: Self-pay

## 2021-06-19 DIAGNOSIS — Z4589 Encounter for adjustment and management of other implanted devices: Secondary | ICD-10-CM | POA: Diagnosis not present

## 2021-06-19 DIAGNOSIS — Z86718 Personal history of other venous thrombosis and embolism: Secondary | ICD-10-CM | POA: Diagnosis not present

## 2021-06-19 DIAGNOSIS — Z95828 Presence of other vascular implants and grafts: Secondary | ICD-10-CM

## 2021-06-19 HISTORY — PX: IR RADIOLOGIST EVAL & MGMT: IMG5224

## 2021-06-19 NOTE — Progress Notes (Addendum)
Chief Complaint: Patient was seen in consultation today for IVC filter management  Referring Physician(s): Dr.Javier Danise Mina  History of Present Illness:  75 year old with a history of multiple medical problems including necrotizing pancreatitis, diabetes and hypertension.  An IVC filter was placed in June 2021 due to right lower extremity DVT because the patient could not be anticoagulated due to anemia and blood loss.  Presumably the DVT was provoked from the patient's necrotizing pancreatitis and her last lower extremity venous duplex in September 2021 was negative for DVT.  Patient returns for discussion of IVC filter retrieval.  I have been in correspondence with her primary care physician Dr.Javier Danise Mina who agrees that there is no need for anticoagulation or IVC filter at this time.  Patient is still having some difficulty controlling her diabetes related to her previous pancreatitis.  Overall, she feels like she is doing well.  She denies chest pain or difficulty breathing.  No significant abdominal symptoms.  She has occasional diarrhea.  No neurologic problems.  The patient denies lower extremity swelling.  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 nephropathy ([place])    DSME at [facility] 01/2016    DKA (diabetic ketoacidoses) 04/22/2020   Frequent epistaxis 05/16/2019   S/p cauterization with resolution 2020   GERD (gastroesophageal reflux disease)    Hepatic steatosis    by abd [place] 05/2012, mild transaminitis - normal iron sat and viral hep panel (2011), stable [place] 2017   History of chicken pox    History of measles    History of recurrent UTIs    on chronic keflex   HLD (hyperlipidemia)    HTN (hypertension)    Hypertensive retinopathy of both eyes, 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    [place]   Kidney cyst, acquired 01/2016   L kidney by US   Kidney stone 01/2016   L kidney by [place]   Lung nodules 11/2013   overall stable on f/u CT 01/2016   Osteopenia 06/2013   mild, forearm T -1.1, hip and spine WNL   Pancreatitis    Polycythemia    mild, stable (2013)   Primary localized osteoarthritis of right knee 01/05/2019   Rosacea    metrogel    Past Surgical History:  Procedure Laterality Date   [place]  12/14/2019   Procedure: BILIARY STENT PLACEMENT;  Surgeon: [name], MD;  Location: [place];  Service: Endoscopy;;   BREAST BIOPSY Right 1963   benign   BREAST BIOPSY Right 08/2014   benign- core   cardiolite stress test  04/2004   normal   CESAREAN SECTION  8016;5537   x2   CHOLECYSTECTOMY  2003   COLONOSCOPY  09/26/2008   adenomatous polyp, rpt 3-5 yrs   COLONOSCOPY  08/2012   adenomatous polyps, diverticulosis, rec rpt 5 yrs [name])   COLONOSCOPY WITH PROPOFOL N/A 02/05/2018   4TA, SSA, diverticulosis, rpt 3 yrs [name], [name], MD)   dexa  2003   normal   dexa  06/2013   [facility] - Tscore -1.1 forearm, normal spine and femur   ERCP N/A 12/14/2019   Procedure: ENDOSCOPIC RETROGRADE CHOLANGIOPANCREATOGRAPHY (ERCP);  Surgeon: [name], MD;  Location: [place];  Service: Endoscopy;  Laterality: N/A;  ERCP  01/2020   nonbleeding gastric ulcer - [facility] hospitalization (Dr Mont Dutton)   ESOPHAGOGASTRODUODENOSCOPY N/A 12/19/2019   Procedure: ESOPHAGOGASTRODUODENOSCOPY (EGD);  Surgeon: [name], MD;  Location: [facility] ENDOSCOPY;  Service: Endoscopy;  Laterality: N/A;   ESOPHAGOGASTRODUODENOSCOPY  01/2020   pre existing AXIOS cystogastrostomy stent s/p necrosectomy - [facility] hospitalization (Dr Mont Dutton)   ESOPHAGOGASTRODUODENOSCOPY N/A 04/24/2020   Procedure: ESOPHAGOGASTRODUODENOSCOPY (EGD);  Surgeon: Wonda Horner, MD;  Location: [facility] ENDOSCOPY;  Service: Endoscopy;  Laterality: N/A;   ESOPHAGOGASTRODUODENOSCOPY   05/2020   normal esophagus, duodenum, erythematous mucosa in gastric body, gastric cystogastrostomy stent removed ([facility])   IR FLUORO GUIDE CV LINE RIGHT  12/27/2019   IR FLUORO GUIDE CV LINE RIGHT  04/24/2020   IR IVC FILTER PLMT / S&I /IMG GUID/MOD SED  04/26/2020   IR RADIOLOGIST EVAL & MGMT  07/31/2020   IR RADIOLOGIST EVAL & MGMT  01/15/2021   IR REMOVAL TUN CV CATH W/O FL  01/03/2020   IR REMOVAL TUN CV CATH W/O FL  04/29/2020   IR REPLC DUODEN/JEJUNO TUBE PERCUT W/FLUORO  04/01/2020   IR US GUIDE VASC ACCESS RIGHT  04/24/2020   SPHINCTEROTOMY  12/14/2019   Procedure: SPHINCTEROTOMY;  Surgeon: [name], MD;  Location: [place];  Service: Endoscopy;;   TOTAL KNEE ARTHROPLASTY Right 01/17/2019   Procedure: TOTAL KNEE ARTHROPLASTY;  Surgeon: [name], MD;  Location: [facility];  Service: Orthopedics;  Laterality: Right;   TRANSTHORACIC ECHOCARDIOGRAM  04/2019   EF 55-60%, modLVH, impaired relaxation    TRIGGER FINGER RELEASE  2007;2010;2011   bilateral   TRIGGER FINGER RELEASE  02/2017   VAGINAL HYSTERECTOMY  1984   for menorrhagia, ovaries in place    Allergies: Azithromycin, Nickel, Sulfa antibiotics, Vinegar [acetic acid], Adhesive [tape], and Keflex [cephalexin]  Medications: Prior to Admission medications   Medication Sig Start Date End Date Taking? Authorizing Provider  acetaminophen (TYLENOL) 325 MG tablet Take 650 mg by mouth every 6 (six) hours as needed for mild pain.    [provider]  amLODipine (NORVASC) 10 MG tablet Take 1 tablet (10 mg total) by mouth daily. 04/03/21   [name], MD  B-D UF III MINI PEN NEEDLES 31G X 5 MM MISC Inject into the skin 4 (four) times daily. 12/27/20   [provider]  benazepril-hydrochlorthiazide (LOTENSIN HCT) 20-12.5 MG tablet Take 1 tablet by mouth daily. 04/03/21   [name], MD  Cholecalciferol (VITAMIN D3) 25 MCG (1000 UT) CAPS Take 1 capsule (1,000 Units total) by mouth daily. Patient not taking: No  sig reported 10/02/20   [name], MD  conjugated estrogens (PREMARIN) vaginal cream Estrogen Cream Instruction Discard applicator Apply pea sized amount to tip of finger to urethra before bed. Wash hands well after application. Use Monday, Wednesday and Friday 12/12/20   Billey Co, MD  Continuous Blood Gluc Receiver (DEXCOM G6 RECEIVER) [place] 1 dexcom receiver 12/26/20   [provider]  Continuous Blood Gluc Sensor (DEXCOM G6 SENSOR) MISC Dispense 1 box Dexcom G6 sensors (3 sensors per box) 12/26/20   [provider]  Continuous Blood Gluc Transmit (DEXCOM G6 TRANSMITTER) MISC Dispense Dexcom G6 Transmitter 12/26/20   [provider]  CREON [id number] units CPEP capsule SMARTSIG:Capsule(s) By Mouth As Directed 05/25/20   [provider]  ferrous sulfate 325 (65 FE) MG tablet Take 1 tablet (325 mg total) by mouth daily with breakfast. 07/02/20   [name], MD  folic acid (FOLVITE) 1 MG tablet TAKE 1  TABLET BY MOUTH  DAILY 05/13/21   [name], MD  glucose blood (ACCU-CHEK AVIVA PLUS) test strip Use as instructed to check blood sugar 4 times daily. 01/01/21   [name], MD  insulin lispro (HUMALOG) 100 UNIT/ML injection Inject 10 Units into the skin 3 (three) times daily before meals. Plus sliding scale. Pt takes 12 units on night 01/01/21   [name], MD  LANTUS SOLOSTAR 100 UNIT/ML Solostar Pen Inject 22 Units into the skin at bedtime. 10/02/20   [name], MD  loperamide (IMODIUM A-D) 2 MG tablet Take 2 mg by mouth 4 (four) times daily as needed for diarrhea or loose stools.    [provider]  meclizine (ANTIVERT) 25 MG tablet Take 1 tablet (25 mg total) by mouth 3 (three) times daily as needed for nausea. 03/13/20   Nicole Kindred A, DO  metoprolol succinate (TOPROL XL) 50 MG 24 hr tablet Take 1 tablet (50 mg total) by mouth daily. 04/03/21 04/03/22  [name], MD  Multiple Vitamin (MULTIVITAMIN)  tablet 1 tablet by Per J Tube route daily.  Patient not taking: No sig reported    [provider]  pantoprazole (PROTONIX) 40 MG tablet TAKE 1 TABLET BY MOUTH  DAILY 05/13/21   [name], MD  rosuvastatin (CRESTOR) 10 MG tablet Take 1 tablet by mouth daily. 08/15/20 08/15/21  [provider]     Family History  Problem Relation Age of Onset   Stroke Mother        several   Hyperlipidemia Mother    Hypertension Mother    Cancer Father        colon   Hypertension Father    Hyperlipidemia Father    Coronary artery disease Father 48       MIx1, CABG   Cancer Paternal Aunt        abdominal   Coronary artery disease Maternal Grandmother    Diabetes Maternal Grandfather    Coronary artery disease Maternal Grandfather    Breast cancer Neg Hx     Social History   Socioeconomic History   Marital status: Married    Spouse name: Not on file   Number of children: Not on file   Years of education: Not on file   Highest education level: Not on file  Occupational History   Not on file  Tobacco Use   Smoking status: Never   Smokeless tobacco: Never  Vaping Use   Vaping Use: Never used  Substance and Sexual Activity   Alcohol use: Not Currently   Drug use: No   Sexual activity: Yes  Other Topics Concern   Not on file  Social History Narrative   B+ blood type   Caffeine: 2 cups coffee/day   Lives with husband, no pets, grown children ([place] and [place])   Occupation: retired Pharmacist, 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 Daily walking about 20 min.    Diet: ok water intake 4 glasses/day, daily fruits/vegetables, red meat 4x/wk, fish 3-4x/wk   Social Determinants of Health   Financial Resource Strain: Low Risk    Difficulty of Paying Living Expenses: Not hard at all  Food Insecurity: No Food Insecurity   Worried About Charity fundraiser in the Last Year: Never true   Ran Out of Food in the  Last Year: Never true  Transportation Needs: No Transportation Needs   Lack of Transportation (Medical): No   Lack  of Transportation (Non-Medical): No  Physical Activity: Inactive   Days of Exercise per Week: 0 days   Minutes of Exercise per Session: 0 min  Stress: No Stress Concern Present   Feeling of Stress : Not at all  Social Connections: Not on file    Review of Systems: A 12 point ROS discussed and pertinent positives are indicated in the HPI above.  All other systems are negative.  Review of Systems  Constitutional: Negative.   Respiratory: Negative.    Cardiovascular:  Negative for chest pain and leg swelling.  Gastrointestinal:  Positive for diarrhea.  Genitourinary: Negative.   Neurological: Negative.    Vital Signs: There were no vitals taken for this visit.  Physical Exam Constitutional:      Appearance: Normal appearance. She is not ill-appearing.  Pulmonary:     Effort: Pulmonary effort is normal.  Musculoskeletal:        General: No swelling.     Right lower leg: No edema.     Left lower leg: No edema.  Neurological:     Mental Status: She is alert.       Imaging:  CLINICAL DATA:  75 year old with history of right lower extremity DVT. IVC filter was placed on 04/26/2020. Patient is being evaluated for filter retrieval.   EXAM: BILATERAL LOWER EXTREMITY VENOUS DOPPLER ULTRASOUND   TECHNIQUE: Gray-scale sonography with graded compression, as well as color Doppler and duplex ultrasound were performed to evaluate the lower extremity deep venous systems from the level of the common femoral vein and including the common femoral, femoral, profunda femoral, popliteal and calf veins including the posterior tibial, peroneal and gastrocnemius veins when visible. The superficial great saphenous vein was also interrogated. Spectral Doppler was utilized to evaluate flow at rest and with distal augmentation maneuvers in the common femoral, femoral and  popliteal veins.   COMPARISON:  07/31/2020   FINDINGS: RIGHT LOWER EXTREMITY   Common Femoral Vein: No evidence of thrombus. Normal compressibility, respiratory phasicity and response to augmentation.   Saphenofemoral Junction: No evidence of thrombus. Normal compressibility and flow on color Doppler imaging.   Profunda Femoral Vein: No evidence of thrombus. Normal compressibility and flow on color Doppler imaging.   Femoral Vein: No evidence of thrombus. Normal compressibility, respiratory phasicity and response to augmentation.   Popliteal Vein: No evidence of thrombus. Normal compressibility, respiratory phasicity and response to augmentation.   Calf Veins: Visualized right deep calf veins are patent without thrombus.   Superficial Great Saphenous Vein: No evidence of thrombus. Normal compressibility.   Venous Reflux:  None.   Other Findings:  None.   LEFT LOWER EXTREMITY   Common Femoral Vein: No evidence of thrombus. Normal compressibility, respiratory phasicity and response to augmentation.   Saphenofemoral Junction: No evidence of thrombus. Normal compressibility and flow on color Doppler imaging.   Profunda Femoral Vein: No evidence of thrombus. Normal compressibility and flow on color Doppler imaging.   Femoral Vein: No evidence of thrombus. Normal compressibility, respiratory phasicity and response to augmentation.   Popliteal Vein: No evidence of thrombus. Normal compressibility, respiratory phasicity and response to augmentation.   Calf Veins: Visualized left deep calf veins are patent without thrombus.   Superficial Great Saphenous Vein: No evidence of thrombus. Normal compressibility.   Venous Reflux:  None.   Other Findings:  None.   IMPRESSION: No evidence of deep venous thrombosis in either lower extremity.     Electronically Signed   By: Scherrie Gerlach.D.  On: 06/19/2021 10:08  Labs:  CBC: Recent Labs    09/25/20 0922  WBC 5.3   HGB 12.6  HCT 38.0  PLT 102.0*    COAGS: No results for input(s): INR, APTT in the last 8760 hours.  BMP: Recent Labs    09/25/20 0922 10/18/20 0811 11/13/20 0848 01/08/21 0844  NA 138 139 139 139  K 4.8 5.1 4.4 4.9  CL 104 108 105 103  CO2 26 24 28 27   GLUCOSE 198* 191* 173* 209*  BUN 38* 35* 29* 33*  CALCIUM 9.9 9.9 10.0 10.2  CREATININE 1.50* 1.41* 1.05 1.24*    LIVER FUNCTION TESTS: Recent Labs    09/25/20 0922 10/18/20 0811 11/13/20 0848  BILITOT 0.6  --   --   AST 12  --   --   ALT 12  --   --   ALKPHOS 104  --   --   PROT 7.1  --   --   ALBUMIN 4.5 4.5 4.8    TUMOR MARKERS: No results for input(s): AFPTM, CEA, CA199, CHROMGRNA in the last 8760 hours.  Assessment and Plan:  75 year old with multiple medical problems including history of necrotizing pancreatitis and diabetes.  IVC filter was placed in 2021 due to right lower extremity DVT and the patient could not be anticoagulated at that time.  Patient is doing much better from a medical standpoint and there are no plans for future anticoagulation and no need for IVC filter at this time.  Bilateral lower extremity venous duplex is negative for DVT.  Discussed IVC filter retrieval with the patient in depth.  Explained that the risks of keeping the IVC filter include filter migration, filter fracture and increased risk for developing a DVT.  We discussed the IVC filter retrieval procedure in depth including the use of moderate sedation.  Patient has a very good understanding of the procedure and we discussed the risks which include bleeding, infection, damage to vascular structures and unsuccessful retrieval.  Patient would like to have the filter removed and we will schedule the patient for outpatient IVC filter retrieval.  Electronically Signed: Burman Riis 06/19/2021, 10:09 AM   I spent a total of    15 Minutes in face to face in clinical consultation, greater than 50% of which was counseling/coordinating  care for IVC filter management patient ID: Katherine Oliver, female   DOB: 11/08/1945, 75 y.o.   MRN: [id number]

## 2021-06-20 ENCOUNTER — Other Ambulatory Visit: Payer: Self-pay | Admitting: *Deleted

## 2021-06-20 ENCOUNTER — Telehealth: Payer: Self-pay | Admitting: *Deleted

## 2021-06-20 DIAGNOSIS — E1169 Type 2 diabetes mellitus with other specified complication: Secondary | ICD-10-CM

## 2021-06-20 NOTE — Chronic Care Management (AMB) (Signed)
  Chronic Care Management   Note  06/20/2021 Name: Katherine Oliver MRN: [id number] DOB: [birth year]  Katherine Oliver is a 75 y.o. year old female who is a primary care patient of [name], MD. I reached out to Katherine Oliver by phone today in response to a referral sent by [name]'s PCP, [name], MD      Katherine Oliver was given information about Chronic Care Management services today including:  CCM service includes personalized support from designated clinical staff supervised by her physician, including individualized plan of care and coordination with other care providers 24/7 contact phone numbers for assistance for urgent and routine care needs. Service will only be billed when office clinical staff spend 20 minutes or more in a month to coordinate care. Only one practitioner may furnish and bill the service in a calendar month. The patient may stop CCM services at any time (effective at the end of the month) by phone call to the office staff. The patient will be responsible for cost sharing (co-pay) of up to 20% of the service fee (after annual deductible is met).  Patient agreed to services and verbal consent obtained.   Follow up plan: Telephone appointment with care management team member scheduled for:07/09/2021  Katherine Oliver Management  Direct Dial: [phone]

## 2021-06-20 NOTE — Patient Outreach (Signed)
[facility]) Care Management  06/20/2021  Katherine Oliver 1945-11-29 [id number]   Case Closure-Diabetes  Pt with an embedded practice and report has been provided to [name]. Pt aware to expect a call from the practice for ongoing care management services. Pt remains stable with CBG 200-300 and last A1C at 7 pending another follow up in Sept. Pt also pending possible removal of her IVC filter and remains on Creon followed by her [place] provider.  No another needed to address immediately as pt continue to do well in managing her diabetes. Case will be closed by community care management services for embedded practice to follow up accordingly.  [name], RN Care Management Coordinator [place] Office [phone]

## 2021-06-23 ENCOUNTER — Other Ambulatory Visit: Payer: Self-pay | Admitting: Family Medicine

## 2021-06-25 ENCOUNTER — Telehealth (HOSPITAL_COMMUNITY): Payer: Self-pay

## 2021-06-25 ENCOUNTER — Other Ambulatory Visit (HOSPITAL_COMMUNITY): Payer: Self-pay | Admitting: Diagnostic Radiology

## 2021-06-25 DIAGNOSIS — Z95828 Presence of other vascular implants and grafts: Secondary | ICD-10-CM

## 2021-06-25 NOTE — Telephone Encounter (Signed)
Called to schedule ivc filter retrieval, no answer, vm not set up. AW

## 2021-07-01 ENCOUNTER — Other Ambulatory Visit: Payer: Self-pay

## 2021-07-01 ENCOUNTER — Encounter: Payer: Self-pay | Admitting: Nurse Practitioner

## 2021-07-01 ENCOUNTER — Ambulatory Visit (INDEPENDENT_AMBULATORY_CARE_PROVIDER_SITE_OTHER): Payer: Medicare Other | Admitting: Nurse Practitioner

## 2021-07-01 VITALS — BP 110/72 | HR 65 | Temp 98.1°F | Ht 61.0 in | Wt 153.8 lb

## 2021-07-01 DIAGNOSIS — N390 Urinary tract infection, site not specified: Secondary | ICD-10-CM

## 2021-07-01 DIAGNOSIS — R3915 Urgency of urination: Secondary | ICD-10-CM | POA: Diagnosis not present

## 2021-07-01 DIAGNOSIS — R350 Frequency of micturition: Secondary | ICD-10-CM | POA: Diagnosis not present

## 2021-07-01 DIAGNOSIS — R300 Dysuria: Secondary | ICD-10-CM | POA: Diagnosis not present

## 2021-07-01 DIAGNOSIS — R319 Hematuria, unspecified: Secondary | ICD-10-CM

## 2021-07-01 DIAGNOSIS — Z87440 Personal history of urinary (tract) infections: Secondary | ICD-10-CM

## 2021-07-01 LAB — POCT URINALYSIS DIP (CLINITEK)
Bilirubin, UA: NEGATIVE
Glucose, UA: NEGATIVE mg/dL
Ketones, POC UA: NEGATIVE mg/dL
Nitrite, UA: POSITIVE — AB
POC PROTEIN,UA: 30 — AB
Spec Grav, UA: 1.015 (ref 1.010–1.025)
Urobilinogen, UA: 1 E.U./dL
pH, UA: 5.5 (ref 5.0–8.0)

## 2021-07-01 MED ORDER — CIPROFLOXACIN HCL 500 MG PO TABS: 500.0000 mg | ORAL_TABLET | Freq: Two times a day (BID) | ORAL | 0 refills | Status: AC

## 2021-07-01 NOTE — Patient Instructions (Signed)
Sent medication to your pharmacy. Will be in touch in regards to your urine culture if the antibiotic regimen needs changed.

## 2021-07-01 NOTE — Assessment & Plan Note (Signed)
History of many UTIs in the past. She is followed by urology

## 2021-07-01 NOTE — Assessment & Plan Note (Signed)
Patient UA indicative of urinary tract infection. Long standing history of the same. Did review past cultures and review her Crcl. She has several allergies and past cultures have shown resistance. Discussed with patient and determined that we would place her on Cipro. Her cultures in the past have shown sensitivity and her las ECG showed a QTc of 418.  Start Cipro 500mg  BID for 7 days ASAP

## 2021-07-01 NOTE — Progress Notes (Signed)
Acute Office Visit  Subjective:    Patient ID: Katherine Oliver, female    DOB: 1945/11/28, 75 y.o.   MRN: [id number]  Chief Complaint  Patient presents with   Urinary Frequency    Started on 06/30/21, some pain in the vaginal area. Took AZO last night.     Patient is in today for urinary complaints Started last night. Urinary frequency, not emptying out all the way, dysuria. Has been using [facility] OTC urinary, Bactine spray with lidocaine with little relief. States she has a strong history of UTIs in the past and is followed by urology.  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 nephropathy ([place])    DSME at [facility] 01/2016    DKA (diabetic ketoacidoses) 04/22/2020   Frequent epistaxis 05/16/2019   S/p cauterization with resolution 2020   GERD (gastroesophageal reflux disease)    Hepatic steatosis    by abd [place] 05/2012, mild transaminitis - normal iron sat and viral hep panel (2011), stable [place] 2017   History of chicken pox    History of measles    History of recurrent UTIs    on chronic keflex   HLD (hyperlipidemia)    HTN (hypertension)    Hypertensive retinopathy of both eyes, 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   [place]   Kidney cyst, acquired 01/2016   L kidney by US   Kidney stone 01/2016   L kidney by [place]   Lung nodules 11/2013   overall stable on f/u CT 01/2016   Osteopenia 06/2013   mild, forearm T -1.1, hip and spine WNL   Pancreatitis    Polycythemia    mild, stable (2013)   Primary localized osteoarthritis of right knee 01/05/2019   Rosacea    metrogel    Past Surgical History:  Procedure Laterality Date   [place]  12/14/2019   Procedure: BILIARY STENT PLACEMENT;  Surgeon: [name], MD;  Location: [facility];  Service: Endoscopy;;    BREAST BIOPSY Right 1963   benign   BREAST BIOPSY Right 08/2014   benign- core   cardiolite stress test  04/2004   normal   CESAREAN SECTION  9518;8416   x2   CHOLECYSTECTOMY  2003   COLONOSCOPY  09/26/2008   adenomatous polyp, rpt 3-5 yrs   COLONOSCOPY  08/2012   adenomatous polyps, diverticulosis, rec rpt 5 yrs [name])   COLONOSCOPY WITH PROPOFOL N/A 02/05/2018   4TA, SSA, diverticulosis, rpt 3 yrs [name], [name], MD)   dexa  2003   normal   dexa  06/2013   [facility] - Tscore -1.1 forearm, normal spine and femur   ERCP N/A 12/14/2019   Procedure: ENDOSCOPIC RETROGRADE CHOLANGIOPANCREATOGRAPHY (ERCP);  Surgeon: [name], MD;  Location: [facility];  Service: Endoscopy;  Laterality: N/A;   ERCP  01/2020   nonbleeding gastric ulcer - [facility] hospitalization (Dr Mont Dutton)   ESOPHAGOGASTRODUODENOSCOPY N/A 12/19/2019   Procedure: ESOPHAGOGASTRODUODENOSCOPY (EGD);  Surgeon: [name], MD;  Location: [facility] ENDOSCOPY;  Service: Endoscopy;  Laterality: N/A;   ESOPHAGOGASTRODUODENOSCOPY  01/2020   pre existing AXIOS cystogastrostomy stent s/p necrosectomy - [facility] hospitalization (Dr Mont Dutton)   ESOPHAGOGASTRODUODENOSCOPY N/A 04/24/2020   Procedure: ESOPHAGOGASTRODUODENOSCOPY (EGD);  Surgeon: Wonda Horner, MD;  Location: [facility]  ENDOSCOPY;  Service: Endoscopy;  Laterality: N/A;   ESOPHAGOGASTRODUODENOSCOPY  05/2020   normal esophagus, duodenum, erythematous mucosa in gastric body, gastric cystogastrostomy stent removed ([facility])   IR FLUORO GUIDE CV LINE RIGHT  12/27/2019   IR FLUORO GUIDE CV LINE RIGHT  04/24/2020   IR IVC FILTER PLMT / S&I /IMG GUID/MOD SED  04/26/2020   IR RADIOLOGIST EVAL & MGMT  07/31/2020   IR RADIOLOGIST EVAL & MGMT  01/15/2021   IR RADIOLOGIST EVAL & MGMT  06/19/2021   IR REMOVAL TUN CV CATH W/O FL  01/03/2020   IR REMOVAL TUN CV CATH W/O FL  04/29/2020   IR REPLC DUODEN/JEJUNO TUBE PERCUT W/FLUORO  04/01/2020   IR US GUIDE VASC ACCESS RIGHT  04/24/2020   SPHINCTEROTOMY   12/14/2019   Procedure: SPHINCTEROTOMY;  Surgeon: [name], MD;  Location: [facility];  Service: Endoscopy;;   TOTAL KNEE ARTHROPLASTY Right 01/17/2019   Procedure: TOTAL KNEE ARTHROPLASTY;  Surgeon: [name], MD;  Location: [facility];  Service: Orthopedics;  Laterality: Right;   TRANSTHORACIC ECHOCARDIOGRAM  04/2019   EF 55-60%, modLVH, impaired relaxation    TRIGGER FINGER RELEASE  2007;2010;2011   bilateral   TRIGGER FINGER RELEASE  02/2017   VAGINAL HYSTERECTOMY  1984   for menorrhagia, ovaries in place    Family History  Problem Relation Age of Onset   Stroke Mother        several   Hyperlipidemia Mother    Hypertension Mother    Cancer Father        colon   Hypertension Father    Hyperlipidemia Father    Coronary artery disease Father 31       MIx1, CABG   Cancer Paternal Aunt        abdominal   Coronary artery disease Maternal Grandmother    Diabetes Maternal Grandfather    Coronary artery disease Maternal Grandfather    Breast cancer Neg Hx     Social History   Socioeconomic History   Marital status: Married    Spouse name: Not on file   Number of children: Not on file   Years of education: Not on file   Highest education level: Not on file  Occupational History   Not on file  Tobacco Use   Smoking status: Never   Smokeless tobacco: Never  Vaping Use   Vaping Use: Never used  Substance and Sexual Activity   Alcohol use: Not Currently   Drug use: No   Sexual activity: Yes  Other Topics Concern   Not on file  Social History Narrative   B+ blood type   Caffeine: 2 cups coffee/day   Lives with husband, no pets, grown children ([place] and [place])   Occupation: retired Pharmacist, 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 Daily walking about 20 min.    Diet: ok water intake 4 glasses/day, daily fruits/vegetables, red meat 4x/wk, fish 3-4x/wk   Social Determinants of Health   Financial Resource  Strain: Low Risk    Difficulty of Paying Living Expenses: Not hard at all  Food Insecurity: No Food Insecurity   Worried About Charity fundraiser in the Last Year: Never true   Ran Out of Food in the Last Year: Never true  Transportation Needs: No Transportation Needs   Lack of Transportation (Medical): No   Lack of Transportation (Non-Medical): No  Physical Activity: Inactive   Days of Exercise per Week:  0 days   Minutes of Exercise per Session: 0 min  Stress: No Stress Concern Present   Feeling of Stress : Not at all  Social Connections: Not on file  Intimate Partner Violence: Not At Risk   Fear of Current or Ex-Partner: No   Emotionally Abused: No   Physically Abused: No   Sexually Abused: No    Outpatient Medications Prior to Visit  Medication Sig Dispense Refill   acetaminophen (TYLENOL) 325 MG tablet Take 650 mg by mouth every 6 (six) hours as needed for mild pain.     amLODipine (NORVASC) 10 MG tablet Take 1 tablet (10 mg total) by mouth daily. 90 tablet 1   B-D UF III MINI PEN NEEDLES 31G X 5 MM MISC Inject into the skin 4 (four) times daily.     benazepril-hydrochlorthiazide (LOTENSIN HCT) 20-12.5 MG tablet Take 1 tablet by mouth daily. 90 tablet 1   Cholecalciferol (VITAMIN D3) 25 MCG (1000 UT) CAPS Take 1 capsule (1,000 Units total) by mouth daily. 30 capsule    conjugated estrogens (PREMARIN) vaginal cream Estrogen Cream Instruction Discard applicator Apply pea sized amount to tip of finger to urethra before bed. Wash hands well after application. Use Monday, Wednesday and Friday 42.5 g 12   Continuous Blood Gluc Receiver (DEXCOM G6 RECEIVER) DEVI Dispense 1 dexcom receiver     Continuous Blood Gluc Sensor (DEXCOM G6 SENSOR) MISC Dispense 1 box Dexcom G6 sensors (3 sensors per box)     Continuous Blood Gluc Transmit (DEXCOM G6 TRANSMITTER) MISC Dispense Dexcom G6 Transmitter     CREON 36000-114000 units CPEP capsule SMARTSIG:Capsule(s) By Mouth As Directed     ferrous  sulfate 325 (65 FE) MG tablet Take 1 tablet (325 mg total) by mouth daily with breakfast. 90 tablet 3   folic acid (FOLVITE) 1 MG tablet TAKE 1 TABLET BY MOUTH  DAILY 90 tablet 1   glucose blood (ACCU-CHEK AVIVA PLUS) test strip Use as instructed to check blood sugar 4 times daily. 400 strip 3   insulin lispro (HUMALOG) 100 UNIT/ML injection Inject 10 Units into the skin 3 (three) times daily before meals. Plus sliding scale. Pt takes 12 units on night     LANTUS SOLOSTAR 100 UNIT/ML Solostar Pen Inject 22 Units into the skin at bedtime.     Lidocaine-Benzalkonium (BACTINE) 2.5-0.13 % LIQD Apply topically. As needed     loperamide (IMODIUM A-D) 2 MG tablet Take 2 mg by mouth 4 (four) times daily as needed for diarrhea or loose stools.     meclizine (ANTIVERT) 25 MG tablet Take 1 tablet (25 mg total) by mouth 3 (three) times daily as needed for nausea. 30 tablet 0   metoprolol succinate (TOPROL XL) 50 MG 24 hr tablet Take 1 tablet (50 mg total) by mouth daily. 90 tablet 1   Multiple Vitamin (MULTIVITAMIN) tablet 1 tablet by Per J Tube route daily.     pantoprazole (PROTONIX) 40 MG tablet TAKE 1 TABLET BY MOUTH  DAILY 90 tablet 1   rosuvastatin (CRESTOR) 10 MG tablet Take 1 tablet by mouth daily.     triamcinolone (KENALOG) 0.1 % paste 3 (three) times daily.     No facility-administered medications prior to visit.    Allergies  Allergen Reactions   Azithromycin Itching    Okay if takes benadryl along with it   Nickel     Reaction to cheap earrings   Sulfa Antibiotics     Itching.    Vinegar [Acetic  Acid]     Nausea.    Adhesive [Tape] Rash    Paper tape - blisters   Keflex [Cephalexin] Rash    Review of Systems  Constitutional:  Negative for chills and fever.  Respiratory:  Negative for shortness of breath.   Cardiovascular:  Negative for chest pain.  Gastrointestinal:  Negative for diarrhea, nausea and vomiting.  Genitourinary:  Positive for difficulty urinating (states feeling of  incomplete emptying), dysuria and frequency. Negative for hematuria, vaginal bleeding, vaginal discharge and vaginal pain (described as discomfort).      Objective:    Physical Exam Vitals and nursing note reviewed.  Constitutional:      Appearance: Normal appearance.  Cardiovascular:     Rate and Rhythm: Normal rate and regular rhythm.  Pulmonary:     Effort: Pulmonary effort is normal.     Breath sounds: Normal breath sounds.  Abdominal:     General: Bowel sounds are normal. There is no distension.     Palpations: Abdomen is soft. There is no mass.     Tenderness: There is no abdominal tenderness. There is no right CVA tenderness or left CVA tenderness.  Neurological:     Mental Status: She is alert.  Psychiatric:        Mood and Affect: Mood normal.        Behavior: Behavior normal.        Thought Content: Thought content normal.        Judgment: Judgment normal.    BP 110/72   Pulse 65   Temp 98.1 F (36.7 C)   Ht 5\' 1"  (1.549 m)   Wt 153 lb 12 oz (69.7 kg)   SpO2 (!) 16%   BMI 29.05 kg/m  Wt Readings from Last 3 Encounters:  07/01/21 153 lb 12 oz (69.7 kg)  04/03/21 149 lb 1 oz (67.6 kg)  01/01/21 142 lb 8 oz (64.6 kg)    Health Maintenance Due  Topic Date Due   Zoster Vaccines- Shingrix (1 of 2) Never done   FOOT EXAM  05/20/2019   COVID-19 Vaccine (3 - Pfizer risk series) 01/05/2020   INFLUENZA VACCINE  06/03/2021    There are no preventive care reminders to display for this patient.   Lab Results  Component Value Date   TSH 3.27 09/25/2020   Lab Results  Component Value Date   WBC 5.3 09/25/2020   HGB 12.6 09/25/2020   HCT 38.0 09/25/2020   MCV 77.4 (L) 09/25/2020   PLT 102.0 (L) 09/25/2020   Lab Results  Component Value Date   NA 139 01/08/2021   K 4.9 01/08/2021   CO2 27 01/08/2021   GLUCOSE 209 (H) 01/08/2021   BUN 33 (H) 01/08/2021   CREATININE 1.24 (H) 01/08/2021   BILITOT 0.6 09/25/2020   ALKPHOS 104 09/25/2020   AST 12  09/25/2020   ALT 12 09/25/2020   PROT 7.1 09/25/2020   ALBUMIN 4.8 11/13/2020   CALCIUM 10.2 01/08/2021   ANIONGAP 10 04/28/2020   GFR 42.30 (L) 01/08/2021   Lab Results  Component Value Date   CHOL 139 09/25/2020   Lab Results  Component Value Date   HDL 45.30 09/25/2020   Lab Results  Component Value Date   LDLCALC 54 09/25/2020   Lab Results  Component Value Date   TRIG 199.0 (H) 09/25/2020   Lab Results  Component Value Date   CHOLHDL 3 09/25/2020   Lab Results  Component Value Date   HGBA1C 7 03/26/2021  Assessment & Plan:   Problem List Items Addressed This Visit       Genitourinary   Urinary tract infection with hematuria    Patient UA indicative of urinary tract infection. Long standing history of the same. Did review past cultures and review her Crcl. She has several allergies and past cultures have shown resistance. Discussed with patient and determined that we would place her on Cipro. Her cultures in the past have shown sensitivity and her las ECG showed a QTc of 418.  Start Cipro 500mg  BID for 7 days ASAP      Relevant Medications   ciprofloxacin (CIPRO) 500 MG tablet     Other   History of recurrent UTIs    History of many UTIs in the past. She is followed by urology      Other Visit Diagnoses     Urinary frequency    -  Primary   Relevant Orders   POCT URINALYSIS DIP (CLINITEK) (Completed)   Urinalysis, microscopic only   Urine Culture   Dysuria            No orders of the defined types were placed in this encounter.  This visit occurred during the SARS-CoV-2 public health emergency.  Safety protocols were in place, including screening questions prior to the visit, additional usage of staff PPE, and extensive cleaning of exam room while observing appropriate contact time as indicated for disinfecting solutions.   Romilda Garret, NP

## 2021-07-02 LAB — URINALYSIS, MICROSCOPIC ONLY

## 2021-07-04 LAB — URINE CULTURE
MICRO NUMBER:: 12303647
SPECIMEN QUALITY:: ADEQUATE

## 2021-07-05 ENCOUNTER — Other Ambulatory Visit: Payer: Self-pay

## 2021-07-05 ENCOUNTER — Ambulatory Visit (INDEPENDENT_AMBULATORY_CARE_PROVIDER_SITE_OTHER): Payer: Medicare Other | Admitting: Physician Assistant

## 2021-07-05 DIAGNOSIS — N390 Urinary tract infection, site not specified: Secondary | ICD-10-CM

## 2021-07-05 NOTE — Progress Notes (Signed)
Patient was scheduled today for UTI symptoms, however she was seen at her PCP and had a urine and urine culture done. The result came back positive for E Coli. She was given Cipro by here PCP. She has not completed the ABX today. I spoke to Sam and we have rescheduled this appointment for 2 weeks to have a symptom recheck and discuss how the medications are doing. She is currently using Cranberry tablets and Premarin. D-mannose was also recommended by Dr. Diamantina Providence. She did not remember this supplement. I wrote it down for her and encouraged her to pick up this supplement as well.

## 2021-07-09 ENCOUNTER — Ambulatory Visit (INDEPENDENT_AMBULATORY_CARE_PROVIDER_SITE_OTHER): Payer: Medicare Other

## 2021-07-09 DIAGNOSIS — Z794 Long term (current) use of insulin: Secondary | ICD-10-CM

## 2021-07-09 DIAGNOSIS — E118 Type 2 diabetes mellitus with unspecified complications: Secondary | ICD-10-CM | POA: Diagnosis not present

## 2021-07-09 DIAGNOSIS — I10 Essential (primary) hypertension: Secondary | ICD-10-CM | POA: Diagnosis not present

## 2021-07-09 NOTE — Patient Instructions (Addendum)
Visit Information: Thank you for taking the time to speak with me today.   PATIENT GOALS:   Goals Addressed             This Visit's Progress    -Disease Progression Prevented or Minimized   On track    Follow up Date 06/04/21 Timeframe:  Short-Term Goal Priority:  medium Start Date:     07/09/2021                      Expected End Date:     10/02/2021         Follow up:  08/22/2021       - check blood sugars as recommended  and record.  Take blood sugar log to provider office visits - check blood sugar if I feel it is too high or too low - check blood pressure daily and record.  - Take your medications as prescribed and refill timely - Follow up with your providers as recommended. (Notify providers of any new or ongoing symptoms) - Follow a low salt/ diabetic diet - Follow Rule of 5 for low blood sugars ( Hypoglycemia)      RULE OF 15 How to treat low blood sugars (Blood sugar less than 70 mg/dl  Please follow the RULE OF 15 for the treatment of hypoglycemia treatment (When your blood sugars are less than 70 mg/ dl) STEP  1:  Take 15 grams of carbohydrates when your blood sugar is low, which includes:   3-4 glucose tabs or  3-4 oz of juice or regular soda or  One tube of glucose gel STEP 2:  Recheck blood sugar in 15 minutes STEP 3:  If your blood sugar is still low at the 15 minute recheck ---then, go back to STEP 1 and treat again with another 15 grams of carbohydrates     COMPLETED: Increase physical activity       COMPLETED: Patient Stated       08/23/2019, I will try to lose about 10-30 lbs in the future.      COMPLETED: [facility]N-Monitor and Manage My Blood Sugar       Follow Up Date:06/03/2021 Timeframe:  Short-Term Goal Priority:  Medium Start Date:  10/30/2020                     Expected End Date:       05/31/2021                 Barriers: Health Behaviors  - check blood sugar at prescribed times - check blood sugar if I feel it is too high or too low - enter blood sugar  readings and medication or insulin into daily log - take the blood sugar log to all doctor visits - take the blood sugar meter to all doctor visits    Why is this important?   Checking your blood sugar at home helps to keep it from getting very high or very low.  Writing the results in a diary or log helps the doctor know how to care for you.  Your blood sugar log should have the time, date and the results.  Also, write down the amount of insulin or other medicine that you take.  Other information, like what you ate, exercise done and how you were feeling, will also be helpful.     Notes:  7/14Praised patient for use of Dexcom , changing transmitter for the first time and seeking  tech support for questions. She discussed blood sugar reading 100-200 range mostly, one reading of 300 after changing transmitter she followed up with finger stick and had lower reading.  6/2-Pt continue to reports she is doing well with better readings from her Dexcom 150-180 and her most recent A1C was 7.0 from 7.4 (next f/u on Sept). Will continue to encourage adherence with managing her diabetes.  5/5- Pt reports she continues to do well with her Dexcom readings from 160-220. Reports upcoming appointment end of May and will requested an updated A1C while at [facility] if not has a pending appointment with her primary provider on 6/1 and will request the lab at that time.  4/5-Discussed pt has a follow up every 2 months with her primary provider who is assisting pt in managing her HTN. Pt reports improved ranges from 120-130/70 over the last month. Will continue to encouraged daily monitoring.  3/4-Discussed ongoing management of care as pt states she is doing better with her Last A1c last week at 7.1 from 8 several months ago. Pt states she also has a dietitian that call 1-2 X monthly to discuss ways to improve her ongoing diabetes. Fasting glucose read lingers around 129 with the lowest at 90 over the last month. Pt aware of  what to do if acute readings are encountered.  1/28-Pt reports readings from 153-183 as she continues to manage. Repeat A1C in Feb and pt will follow up with her Endocrinologist at that time. Pt verified she has changed her dietary habits to reflect improved readings and will continue to work on managing her diabetes however her focus has been on managing her HTN due to the recent change. Strongly encourage adherence with the ongoing plan of care related. Dec-Pt continue to work with her dietary habits high protein, low carbohydrate. Recent A1c 8.9 Nov treated with increased insulin doses. Reports reading have improved after medication changes (143-195). Will extend this goal to allow adherence with the new increased insulin dosages.   EMBEDDED PRACTICE     COMPLETED: THN-Set My Target A1C       Follow Up Date 8/1//2022 Timeframe:  Long-Range Goal Priority:  Medium Start Date:    10/30/2020                   Expected End Date:     05/31/2021                              Barriers: Health Behaviors  - set target A1C    Why is this important?   Your target A1C is decided together by you and your doctor.  It is based on several things like your age and other health issues.    Notes:  05/16/21 Reviewed recent A1c 7.0 on 03/26/21  in control range.  4/5-Pt reports most recent A1C 2/23 at 7.4 much improved from 8.9 and she has recently increased her insulin to accommodate her elevated readings that were over 200 now 168-199. Pt has a new Dexcom for closer monitoring up to 5X daily that alerts her with increases and when she is to elevated prior to her meals. Pt also has changes with her insulin SS. Will encouraged adherence to all the recent changes to improve her ongoing diabetes management of care. 1/28-Pending repeat A1C the last of Feb and follow up with his endocrinologist. Currently reports readings for CBG from 153-183 asymptomatic. Continue to working on improving her A1c  Dec-A1C 8.9  09/22/2020-insulin increased with daily glucose readings improved 143-195. Will continue to educate on dietary habits and reducing carbohydrates  EMBEDDED PRACTICE        Consent to CCM Services: Ms. Checo was given information about Chronic Care Management services including:  CCM service includes personalized support from designated clinical staff supervised by her physician, including individualized plan of care and coordination with other care providers 24/7 contact phone numbers for assistance for urgent and routine care needs. Service will only be billed when office clinical staff spend 20 minutes or more in a month to coordinate care. Only one practitioner may furnish and bill the service in a calendar month. The patient may stop CCM services at any time (effective at the end of the month) by phone call to the office staff. The patient will be responsible for cost sharing (co-pay) of up to 20% of the service fee (after annual deductible is met).  Patient agreed to services and verbal consent obtained.   Patient verbalizes understanding of instructions provided today and agrees to view in [facility].   The patient has been provided with contact information for the care management team and has been advised to call with any health related questions or concerns.  The care management team will reach out to the patient again over the next 45 days.   Quinn Plowman RN,BSN,CCM RN Case Manager [facility]  [phone]   CLINICAL CARE PLAN: Patient Care Plan: Generic plan of care     Problem Identified: Knowledge deficit related to long term care plan for self management of chronic conditions.   Priority: Medium     Long-Range Goal: Management of chronic health conditions   Start Date: 07/10/2020  This Visit's Progress: On track  Recent Progress: On track  Priority: Medium  Note:   Objective:  Lab Results  Component Value Date   HGBA1C 7 03/26/2021   Lab Results   Component Value Date   CREATININE 1.24 (H) 01/08/2021   CREATININE 1.05 11/13/2020   CREATININE 1.41 (H) 10/18/2020   BP Readings from Last 3 Encounters:  07/01/21 110/72  04/03/21 136/80  01/01/21 128/72  Current Barriers:  Knowledge deficit related to long term care plan for self management of chronic health conditions: Diabetes Type 2 and Hypertension Case Manager Clinical Goal(s):  patient will attend scheduled medical appointments patient will demonstrate improved adherence to prescribed treatment plan for hypertension and diabetes as evidenced by taking all medications as prescribed, monitoring and recording blood pressure and blood sugars as directed, adhering to low sodium/DASH diet/ Diabetic diet.  Patient will follow up with providers as recommended.  Patient will take medications as prescribed patient will verbalize basic understanding of hypertension and diabetes disease process and self health management plan  Interventions:  Collaboration with [name], MD regarding development and update of comprehensive plan of care as evidenced by provider attestation and co-signature Inter-disciplinary care team collaboration (see longitudinal plan of care) Reviewed medications with patient and discussed importance of medication adherence Discussed plans with patient for ongoing care management follow up and provided patient with direct contact information for care management team Provided patient with written educational materials related to diabetes/ hypertension management.  Reviewed scheduled/upcoming provider appointments  Advised patient, providing education and rationale, to monitor blood pressure and blood sugars daily and record, calling PCP for findings outside established parameters.  Patient Goals: - check blood sugars as recommended  and record.  Take blood sugar log to provider office visits -  check blood sugar if I feel it is too high or too low - check blood  pressure daily and record.  - Take your medications as prescribed and refill timely - Follow up with your providers as recommended. (Notify providers of any new or ongoing symptoms) - Follow a low salt/ diabetic diet - Follow Rule of 5 for low blood sugars ( Hypoglycemia)      RULE OF 15 How to treat low blood sugars (Blood sugar less than 70 mg/dl  Please follow the RULE OF 15 for the treatment of hypoglycemia treatment (When your blood sugars are less than 70 mg/ dl) STEP  1:  Take 15 grams of carbohydrates when your blood sugar is low, which includes:   3-4 glucose tabs or  3-4 oz of juice or regular soda or  One tube of glucose gel STEP 2:  Recheck blood sugar in 15 minutes STEP 3:  If your blood sugar is still low at the 15 minute recheck ---then, go back to STEP 1 and treat again with another 15 grams of carbohydrates Follow Up Plan: The patient has been provided with contact information for the care management team and has been advised to call with any health related questions or concerns.  The care management team will reach out to the patient again over the next 45 days.

## 2021-07-09 NOTE — Chronic Care Management (AMB) (Signed)
Chronic Care Management   CCM RN Visit Note  07/09/2021 Name: Katherine Oliver MRN: [id number] DOB: 1946/06/05  Subjective: Katherine Oliver is a 75 y.o. year old female who is a primary care patient of [name], MD. The care management team was consulted for assistance with disease management and care coordination needs.    Engaged with patient by telephone for initial visit in response to provider referral for case management and/or care coordination services.   Consent to Services:  The patient was given the following information about Chronic Care Management services today, agreed to services, and gave verbal consent: 1. CCM service includes personalized support from designated clinical staff supervised by the primary care provider, including individualized plan of care and coordination with other care providers 2. 24/7 contact phone numbers for assistance for urgent and routine care needs. 3. Service will only be billed when office clinical staff spend 20 minutes or more in a month to coordinate care. 4. Only one practitioner may furnish and bill the service in a calendar month. 5.The patient may stop CCM services at any time (effective at the end of the month) by phone call to the office staff. 6. The patient will be responsible for cost sharing (co-pay) of up to 20% of the service fee (after annual deductible is met). Patient agreed to services and consent obtained.  Patient agreed to services and verbal consent obtained.   Assessment: Review of patient past medical history, allergies, medications, health status, including review of consultants reports, laboratory and other test data, was performed as part of comprehensive evaluation and provision of chronic care management services.   SDOH (Social Determinants of Health) assessments and interventions performed:  SDOH Interventions    Flowsheet Row Most Recent Value  SDOH Interventions   Food Insecurity  Interventions Intervention Not Indicated  Housing Interventions Intervention Not Indicated  Transportation Interventions Intervention Not Indicated        CCM Care Plan  Allergies  Allergen Reactions   Azithromycin Itching    Okay if takes benadryl along with it   Nickel     Reaction to cheap earrings   Sulfa Antibiotics     Itching.    Vinegar [Acetic Acid]     Nausea.    Adhesive [Tape] Rash    Paper tape - blisters   Keflex [Cephalexin] Rash    Outpatient Encounter Medications as of 07/09/2021  Medication Sig Note   acetaminophen (TYLENOL) 325 MG tablet Take 650 mg by mouth every 6 (six) hours as needed for mild pain.    amLODipine (NORVASC) 10 MG tablet Take 1 tablet (10 mg total) by mouth daily.    benazepril-hydrochlorthiazide (LOTENSIN HCT) 20-12.5 MG tablet Take 1 tablet by mouth daily.    Cholecalciferol (VITAMIN D3) 25 MCG (1000 UT) CAPS Take 1 capsule (1,000 Units total) by mouth daily.    conjugated estrogens (PREMARIN) vaginal cream Estrogen Cream Instruction Discard applicator Apply pea sized amount to tip of finger to urethra before bed. Wash hands well after application. Use Monday, Wednesday and Friday    CREON 36000-114000 units CPEP capsule SMARTSIG:Capsule(s) By Mouth As Directed    ferrous sulfate 325 (65 FE) MG tablet Take 1 tablet (325 mg total) by mouth daily with breakfast.    folic acid (FOLVITE) 1 MG tablet TAKE 1 TABLET BY MOUTH  DAILY    insulin lispro (HUMALOG) 100 UNIT/ML injection Inject 10 Units into the skin 3 (three) times daily before meals. Plus sliding scale. Pt takes  12 units on night 07/09/2021: Patient states she takes 10 -15 units ( sliding scale)   LANTUS SOLOSTAR 100 UNIT/ML Solostar Pen Inject 22 Units into the skin at bedtime.    Lidocaine-Benzalkonium (BACTINE) 2.5-0.13 % LIQD Apply topically. As needed    loperamide (IMODIUM A-D) 2 MG tablet Take 2 mg by mouth 4 (four) times daily as needed for diarrhea or loose stools. 07/09/2021:  Patient states she takes 1 time per day in the am   meclizine (ANTIVERT) 25 MG tablet Take 1 tablet (25 mg total) by mouth 3 (three) times daily as needed for nausea.    metoprolol succinate (TOPROL XL) 50 MG 24 hr tablet Take 1 tablet (50 mg total) by mouth daily.    pantoprazole (PROTONIX) 40 MG tablet TAKE 1 TABLET BY MOUTH  DAILY    rosuvastatin (CRESTOR) 10 MG tablet Take 1 tablet by mouth daily.    triamcinolone (KENALOG) 0.1 % paste 3 (three) times daily.    B-D UF III MINI PEN NEEDLES 31G X 5 MM MISC Inject into the skin 4 (four) times daily.    Continuous Blood Gluc Receiver (DEXCOM G6 RECEIVER) DEVI Dispense 1 dexcom receiver    Continuous Blood Gluc Sensor (DEXCOM G6 SENSOR) MISC Dispense 1 box Dexcom G6 sensors (3 sensors per box)    Continuous Blood Gluc Transmit (DEXCOM G6 TRANSMITTER) MISC Dispense Dexcom G6 Transmitter    glucose blood (ACCU-CHEK AVIVA PLUS) test strip Use as instructed to check blood sugar 4 times daily.    Multiple Vitamin (MULTIVITAMIN) tablet 1 tablet by Per J Tube route daily.    No facility-administered encounter medications on file as of 07/09/2021.    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   Pain involving joint of finger of left hand 10/02/2020   Protein-calorie malnutrition ([place]) 07/02/2020   Clostridioides difficile infection 06/05/2020   Hair loss 06/05/2020   Right femoral vein DVT ([place]) 05/03/2020   Presence of IVC filter 05/03/2020   Hx of epistaxis    Aortic atherosclerosis ([place])    Urinary tract infection with hematuria 03/07/2020   Chronic pancreatitis ([place]) 01/12/2020   Acquired renal cyst of left kidney 01/06/2020   Gastric stress ulcer 01/06/2020   Necrotizing pancreatitis    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[place])    Acute gallstone pancreatitis 12/13/2019   Chronic diarrhea 06/01/2019   Primary localized osteoarthritis of right knee 01/05/2019   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   Hx of adenomatous polyp of colon 12/15/2017   Vulvar dermatitis 12/07/2017   Stress due to illness of family member 08/18/2017   Left sided abdominal pain 02/05/2017   Right hip pain 08/07/2016   CKD stage 3 due to type 2 diabetes mellitus ([place]) 04/04/2016   Trigger ring finger of right hand 04/04/2016   Pulmonary nodules 01/03/2016   Advanced care planning/counseling discussion 07/05/2015   Overweight (BMI 25.0-29.9) 07/05/2015   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   Osteopenia 06/03/2013   Medicare annual wellness visit, subsequent 05/31/2012   Rotator cuff tendonitis, right 03/01/2012   Polycythemia 12/02/2011   Controlled diabetes mellitus type 2 with complications ([place])    Accelerated hypertension    Dyslipidemia associated with type 2 diabetes mellitus ([place])    History of recurrent UTIs    Allergic rhinitis    GERD (gastroesophageal reflux disease)    Rosacea    NAFLD (nonalcoholic fatty liver disease) 06/03/2010  Conditions to be addressed/monitored:HTN and DMII  Care Plan : Generic plan of care  Updates made by Dannielle Karvonen, RN since 07/09/2021 12:00 AM     Problem: Knowledge deficit related to long term care plan for self management of chronic conditions.   Priority: Medium     Long-Range Goal: Management of chronic health conditions   Start Date: 07/10/2020  This Visit's Progress: On track  Recent Progress: On track  Priority: Medium  Note:   Objective:  Lab Results  Component Value Date   HGBA1C 7 03/26/2021   Lab Results  Component Value Date   CREATININE 1.24 (H) 01/08/2021   CREATININE 1.05 11/13/2020   CREATININE 1.41 (H) 10/18/2020   BP Readings from Last 3 Encounters:  07/01/21 110/72  04/03/21 136/80  01/01/21 128/72  Current Barriers:  Knowledge deficit related to long term care plan for self management of chronic health conditions: Diabetes Type 2 and Hypertension Case Manager Clinical Goal(s):   patient will attend scheduled medical appointments patient will demonstrate improved adherence to prescribed treatment plan for hypertension and diabetes as evidenced by taking all medications as prescribed, monitoring and recording blood pressure and blood sugars as directed, adhering to low sodium/DASH diet/ Diabetic diet.  Patient will follow up with providers as recommended.  Patient will take medications as prescribed patient will verbalize basic understanding of hypertension and diabetes disease process and self health management plan  Interventions:  Collaboration with [name], MD regarding development and update of comprehensive plan of care as evidenced by provider attestation and co-signature Inter-disciplinary care team collaboration (see longitudinal plan of care) Reviewed medications with patient and discussed importance of medication adherence Discussed plans with patient for ongoing care management follow up and provided patient with direct contact information for care management team Provided patient with written educational materials related to diabetes/ hypertension management.  Reviewed scheduled/upcoming provider appointments  Advised patient, providing education and rationale, to monitor blood pressure and blood sugars daily and record, calling PCP for findings outside established parameters.  Patient Goals: - check blood sugars as recommended  and record.  Take blood sugar log to provider office visits - check blood sugar if I feel it is too high or too low - check blood pressure daily and record.  - Take your medications as prescribed and refill timely - Follow up with your providers as recommended. (Notify providers of any new or ongoing symptoms) - Follow a low salt/ diabetic diet - Follow Rule of 5 for low blood sugars ( Hypoglycemia)      RULE OF 15 How to treat low blood sugars (Blood sugar less than 70 mg/dl  Please follow the RULE OF 15 for the  treatment of hypoglycemia treatment (When your blood sugars are less than 70 mg/ dl) STEP  1:  Take 15 grams of carbohydrates when your blood sugar is low, which includes:   3-4 glucose tabs or  3-4 oz of juice or regular soda or  One tube of glucose gel STEP 2:  Recheck blood sugar in 15 minutes STEP 3:  If your blood sugar is still low at the 15 minute recheck ---then, go back to STEP 1 and treat again with another 15 grams of carbohydrates Follow Up Plan: The patient has been provided with contact information for the care management team and has been advised to call with any health related questions or concerns.  The care management team will reach out to the patient again over the next 45  days.          Plan:The patient has been provided with contact information for the care management team and has been advised to call with any health related questions or concerns.  and The care management team will reach out to the patient again over the next 45 days. Quinn Plowman RN,BSN,CCM RN Case Manager [place]  [phone]

## 2021-07-16 ENCOUNTER — Ambulatory Visit (INDEPENDENT_AMBULATORY_CARE_PROVIDER_SITE_OTHER): Payer: Medicare Other | Admitting: Physician Assistant

## 2021-07-16 ENCOUNTER — Encounter: Payer: Self-pay | Admitting: Physician Assistant

## 2021-07-16 ENCOUNTER — Other Ambulatory Visit: Payer: Self-pay

## 2021-07-16 VITALS — BP 147/80 | HR 76 | Ht 61.0 in | Wt 150.0 lb

## 2021-07-16 DIAGNOSIS — N390 Urinary tract infection, site not specified: Secondary | ICD-10-CM | POA: Diagnosis not present

## 2021-07-16 DIAGNOSIS — R350 Frequency of micturition: Secondary | ICD-10-CM | POA: Diagnosis not present

## 2021-07-16 NOTE — Progress Notes (Signed)
07/16/2021 11:00 AM   [place] 1946/06/17 [id number]  CC: Chief Complaint  Patient presents with   Recurrent UTI    HPI: Katherine Oliver is a 75 y.o. female with PMH recurrent UTI on Premarin and cranberry supplements previously on suppressive trimethoprim and recently treated for E. coli UTI with culture appropriate Cipro who presents today for symptom recheck and medication management.   Today she reports her UTI symptoms have completely resolved after completing Cipro.  She is taking cranberry supplements twice daily and Premarin cream 3-4 times weekly.  She was encouraged via telephone to start d-mannose supplements, however she has had difficulty finding these in stores.  She hopes her son will be able to find these online for her.  Suppressive trimethoprim was discontinued in February 2022 and she has not had any other breakthrough infections since.  She reports urinary frequency at baseline, typically every 2-3 hours during the day, but now every 1-2 hours since her recent UTI treatment.  She reports variable nocturia, every 3-4 hours on a "good" day and hourly on a "bad" day.  She is minimally bothered by this.  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 nephropathy ([place])    DSME at [name] 01/2016    DKA (diabetic ketoacidoses) 04/22/2020   Frequent epistaxis 05/16/2019   S/p cauterization with resolution 2020   GERD (gastroesophageal reflux disease)    Hepatic steatosis    by abd [place] 05/2012, mild transaminitis - normal iron sat and viral hep panel (2011), stable [place] 2017   History of chicken pox    History of measles    History of recurrent UTIs    on chronic keflex   HLD (hyperlipidemia)    HTN (hypertension)    Hypertensive retinopathy of both  eyes, 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   Bul[place]owski   Kidney cyst, acquired 01/2016   L kidney by US   Kidney stone 01/2016   L kidney by [place]   Lung nodules 11/2013   overall stable on f/u CT 01/2016   Osteopenia 06/2013   mild, forearm T -1.1, hip and spine WNL   Pancreatitis    Polycythemia    mild, stable (2013)   Primary localized osteoarthritis of right knee 01/05/2019   Rosacea    metrogel    Surgical History: Past Surgical History:  Procedure Laterality Date   [place]  12/14/2019   Procedure: BILIARY STENT PLACEMENT;  Surgeon: [name], MD;  Location: [place];  Service: Endoscopy;;   BREAST BIOPSY Right 1963   benign   BREAST BIOPSY Right 08/2014   benign- core   cardiolite stress test  04/2004   normal   CESAREAN SECTION  [id number];[id number]   x2   CHOLECYSTECTOMY  2003   COLONOSCOPY  09/26/2008   adenomatous polyp, rpt 3-5 yrs   COLONOSCOPY  08/2012   adenomatous polyps, diverticulosis, rec rpt 5 yrs [name])   COLONOSCOPY WITH PROPOFOL N/A 02/05/2018   4TA, SSA, diverticulosis, rpt 3 yrs [name], [name], MD)   dexa  2003   normal   dexa  06/2013   [facility] - Tscore -1.1 forearm, normal spine and femur   ERCP N/A 12/14/2019   Procedure: ENDOSCOPIC RETROGRADE CHOLANGIOPANCREATOGRAPHY (  ERCP);  Surgeon: [name], MD;  Location: [place];  Service: Endoscopy;  Laterality: N/A;   ERCP  01/2020   nonbleeding gastric ulcer - [facility] hospitalization (Dr Mont Dutton)   ESOPHAGOGASTRODUODENOSCOPY N/A 12/19/2019   Procedure: ESOPHAGOGASTRODUODENOSCOPY (EGD);  Surgeon: [name], MD;  Location[facility] ENDOSCOPY;  Service: Endoscopy;  Laterality: N/A;   ESOPHAGOGASTRODUODENOSCOPY  01/2020   pre existing AXIOS cystogastrostomy stent s/p necrosectomy - [facility] hospitalization (Dr Mont Dutton)   ESOPHAGOGASTRODUODENOSCOPY N/A 04/24/2020   Procedure: ESOPHAGOGASTRODUODENOSCOPY (EGD);  Surgeon: Wonda Horner, MD;  Location: [facility] ENDOSCOPY;  Service: Endoscopy;   Laterality: N/A;   ESOPHAGOGASTRODUODENOSCOPY  05/2020   normal esophagus, duodenum, erythematous mucosa in gastric body, gastric cystogastrostomy stent removed ([facility])   IR FLUORO GUIDE CV LINE RIGHT  12/27/2019   IR FLUORO GUIDE CV LINE RIGHT  04/24/2020   IR IVC FILTER PLMT / S&I /IMG GUID/MOD SED  04/26/2020   IR RADIOLOGIST EVAL & MGMT  07/31/2020   IR RADIOLOGIST EVAL & MGMT  01/15/2021   IR RADIOLOGIST EVAL & MGMT  06/19/2021   IR REMOVAL TUN CV CATH W/O FL  01/03/2020   IR REMOVAL TUN CV CATH W/O FL  04/29/2020   IR REPLC DUODEN/JEJUNO TUBE PERCUT W/FLUORO  04/01/2020   IR US GUIDE VASC ACCESS RIGHT  04/24/2020   SPHINCTEROTOMY  12/14/2019   Procedure: SPHINCTEROTOMY;  Surgeon: [name], MD;  Location: [place];  Service: Endoscopy;;   TOTAL KNEE ARTHROPLASTY Right 01/17/2019   Procedure: TOTAL KNEE ARTHROPLASTY;  Surgeon: [name], MD;  Location: [facility];  Service: Orthopedics;  Laterality: Right;   TRANSTHORACIC ECHOCARDIOGRAM  04/2019   EF 55-60%, modLVH, impaired relaxation    TRIGGER FINGER RELEASE  2007;2010;2011   bilateral   TRIGGER FINGER RELEASE  02/2017   VAGINAL HYSTERECTOMY  1984   for menorrhagia, ovaries in place    Home Medications:  Allergies as of 07/16/2021       Reactions   Azithromycin Itching   Okay if takes benadryl along with it   Nickel    Reaction to cheap earrings   Sulfa Antibiotics    Itching.    Vinegar [acetic Acid]    Nausea.    Adhesive [tape] Rash   Paper tape - blisters   Keflex [cephalexin] Rash        Medication List        Accurate as of July 16, 2021 11:00 AM. If you have any questions, ask your nurse or doctor.          Accu-Chek Aviva Plus test strip Generic drug: glucose blood Use as instructed to check blood sugar 4 times daily.   acetaminophen 325 MG tablet Commonly known as: TYLENOL Take 650 mg by mouth every 6 (six) hours as needed for mild pain.   amLODipine 10 MG tablet Commonly known as:  NORVASC Take 1 tablet (10 mg total) by mouth daily.   B-D UF III MINI PEN NEEDLES 31G X 5 MM Misc Generic drug: Insulin Pen Needle Inject into the skin 4 (four) times daily.   Bactine 2.5-0.13 % Liqd Generic drug: Lidocaine-Benzalkonium Apply topically. As needed   benazepril-hydrochlorthiazide 20-12.5 MG tablet Commonly known as: LOTENSIN HCT Take 1 tablet by mouth daily.   conjugated estrogens 0.625 MG/GM vaginal cream Commonly known as: PREMARIN Estrogen Cream Instruction Discard applicator Apply pea sized amount to tip of finger to urethra before bed. Wash hands well after application. Use Monday, Wednesday and Friday   Creon 36000 UNITS Cpep capsule  Generic drug: lipase/protease/amylase SMARTSIG:Capsule(s) By Mouth As Directed   Dexcom G6 Receiver Devi Dispense 1 dexcom receiver   Dexcom G6 Sensor Misc Dispense 1 box Dexcom G6 sensors (3 sensors per box)   Dexcom G6 Transmitter Misc Dispense Dexcom G6 Transmitter   ferrous sulfate 325 (65 FE) MG tablet Take 1 tablet (325 mg total) by mouth daily with breakfast.   folic acid 1 MG tablet Commonly known as: FOLVITE TAKE 1 TABLET BY MOUTH  DAILY   insulin lispro 100 UNIT/ML injection Commonly known as: HUMALOG Inject 10 Units into the skin 3 (three) times daily before meals. Plus sliding scale. Pt takes 12 units on night   Lantus SoloStar 100 UNIT/ML Solostar Pen Generic drug: insulin glargine Inject 22 Units into the skin at bedtime.   loperamide 2 MG tablet Commonly known as: IMODIUM A-D Take 2 mg by mouth 4 (four) times daily as needed for diarrhea or loose stools.   meclizine 25 MG tablet Commonly known as: ANTIVERT Take 1 tablet (25 mg total) by mouth 3 (three) times daily as needed for nausea.   metoprolol succinate 50 MG 24 hr tablet Commonly known as: Toprol XL Take 1 tablet (50 mg total) by mouth daily.   multivitamin tablet 1 tablet by Per J Tube route daily.   pantoprazole 40 MG  tablet Commonly known as: PROTONIX TAKE 1 TABLET BY MOUTH  DAILY   rosuvastatin 10 MG tablet Commonly known as: CRESTOR Take 1 tablet by mouth daily.   triamcinolone 0.1 % paste Commonly known as: KENALOG 3 (three) times daily.   Vitamin D3 25 MCG (1000 UT) Caps Take 1 capsule (1,000 Units total) by mouth daily.        Allergies:  Allergies  Allergen Reactions   Azithromycin Itching    Okay if takes benadryl along with it   Nickel     Reaction to cheap earrings   Sulfa Antibiotics     Itching.    Vinegar [Acetic Acid]     Nausea.    Adhesive [Tape] Rash    Paper tape - blisters   Keflex [Cephalexin] Rash    Family History: Family History  Problem Relation Age of Onset   Stroke Mother        several   Hyperlipidemia Mother    Hypertension Mother    Cancer Father        colon   Hypertension Father    Hyperlipidemia Father    Coronary artery disease Father 17       MIx1, CABG   Cancer Paternal Aunt        abdominal   Coronary artery disease Maternal Grandmother    Diabetes Maternal Grandfather    Coronary artery disease Maternal Grandfather    Breast cancer Neg Hx     Social History:   reports that she has never smoked. She has never used smokeless tobacco. She reports that she does not currently use alcohol. She reports that she does not use drugs.  Physical Exam: BP (!) 147/80   Pulse 76   Ht 5\' 1"  (1.549 m)   Wt 150 lb (68 kg)   BMI 28.34 kg/m   Constitutional:  Alert and oriented, no acute distress, nontoxic appearing HEENT: [place], AT Cardiovascular: No clubbing, cyanosis, or edema Respiratory: Normal respiratory effort, no increased work of breathing Skin: No rashes, bruises or suspicious lesions Neurologic: Grossly intact, no focal deficits, moving all 4 extremities Psychiatric: Normal mood and affect  Assessment & Plan:  1. Recurrent UTI Recently treated with culture appropriate antibiotics for breakthrough UTI.  Doing well on vaginal  estrogen cream and cranberry supplements.  Agree with starting d-mannose if available.  We discussed that 1-2 breakthrough infections per year is normal with recurrent UTIs and that unless she develops increased frequency of UTI, I do not recommend further preventative action.  She expressed understanding.  2. Urinary frequency Slightly increased over baseline in the wake of her recent UTI.  Likely due to residual bladder inflammation and will resolve on its own.  We discussed that we may consider OAB meds in the future if she is increasingly bothered by this, however she would like to defer this at this time.  I think this is reasonable.  Return if symptoms worsen or fail to improve.  Debroah Loop, PA-C  [facility] [address] [place], [place] [address] [phone]

## 2021-07-16 NOTE — Patient Instructions (Signed)
Continue your cranberry supplements and estrogen cream, and start d-mannose supplements. Come back and see me if you develop another UTI.

## 2021-07-17 DIAGNOSIS — E0822 Diabetes mellitus due to underlying condition with diabetic chronic kidney disease: Secondary | ICD-10-CM | POA: Diagnosis not present

## 2021-07-17 DIAGNOSIS — K851 Biliary acute pancreatitis without necrosis or infection: Secondary | ICD-10-CM | POA: Diagnosis not present

## 2021-07-17 DIAGNOSIS — Z833 Family history of diabetes mellitus: Secondary | ICD-10-CM | POA: Diagnosis not present

## 2021-07-17 DIAGNOSIS — K8689 Other specified diseases of pancreas: Secondary | ICD-10-CM | POA: Diagnosis not present

## 2021-07-17 DIAGNOSIS — S36209S Unspecified injury of unspecified part of pancreas, sequela: Secondary | ICD-10-CM | POA: Diagnosis not present

## 2021-07-17 DIAGNOSIS — E139 Other specified diabetes mellitus without complications: Secondary | ICD-10-CM | POA: Diagnosis not present

## 2021-07-17 DIAGNOSIS — Z8719 Personal history of other diseases of the digestive system: Secondary | ICD-10-CM | POA: Diagnosis not present

## 2021-07-17 DIAGNOSIS — D631 Anemia in chronic kidney disease: Secondary | ICD-10-CM | POA: Diagnosis not present

## 2021-07-17 DIAGNOSIS — I129 Hypertensive chronic kidney disease with stage 1 through stage 4 chronic kidney disease, or unspecified chronic kidney disease: Secondary | ICD-10-CM | POA: Diagnosis not present

## 2021-07-17 DIAGNOSIS — Z23 Encounter for immunization: Secondary | ICD-10-CM | POA: Diagnosis not present

## 2021-07-17 DIAGNOSIS — Z794 Long term (current) use of insulin: Secondary | ICD-10-CM | POA: Diagnosis not present

## 2021-07-17 DIAGNOSIS — N183 Chronic kidney disease, stage 3 unspecified: Secondary | ICD-10-CM | POA: Diagnosis not present

## 2021-07-17 DIAGNOSIS — Z87440 Personal history of urinary (tract) infections: Secondary | ICD-10-CM | POA: Diagnosis not present

## 2021-07-22 ENCOUNTER — Encounter: Payer: Self-pay | Admitting: Family Medicine

## 2021-07-22 ENCOUNTER — Telehealth (INDEPENDENT_AMBULATORY_CARE_PROVIDER_SITE_OTHER): Payer: Medicare Other | Admitting: Family Medicine

## 2021-07-22 VITALS — BP 142/86 | HR 70 | Ht 61.0 in | Wt 152.0 lb

## 2021-07-22 DIAGNOSIS — I10 Essential (primary) hypertension: Secondary | ICD-10-CM

## 2021-07-22 DIAGNOSIS — E1122 Type 2 diabetes mellitus with diabetic chronic kidney disease: Secondary | ICD-10-CM | POA: Diagnosis not present

## 2021-07-22 DIAGNOSIS — N183 Chronic kidney disease, stage 3 unspecified: Secondary | ICD-10-CM

## 2021-07-22 DIAGNOSIS — E118 Type 2 diabetes mellitus with unspecified complications: Secondary | ICD-10-CM

## 2021-07-22 DIAGNOSIS — Z794 Long term (current) use of insulin: Secondary | ICD-10-CM | POA: Diagnosis not present

## 2021-07-22 DIAGNOSIS — Z95828 Presence of other vascular implants and grafts: Secondary | ICD-10-CM | POA: Diagnosis not present

## 2021-07-22 DIAGNOSIS — Z87440 Personal history of urinary (tract) infections: Secondary | ICD-10-CM

## 2021-07-22 NOTE — Assessment & Plan Note (Signed)
Planning removal next week.

## 2021-07-22 NOTE — Assessment & Plan Note (Signed)
Chronic, stable. Update renal panel in 1 wk.

## 2021-07-22 NOTE — Assessment & Plan Note (Signed)
Chronic, improved based on home readings - however concern for hyperkalemia and ongoing CKD - I suggested she cut benazepril/hctz in half for 1 wk then recheck labwork at [facility] office.

## 2021-07-22 NOTE — Assessment & Plan Note (Addendum)
Appreciate urology care.  Recently completed UTI treatment with cipro course.

## 2021-07-22 NOTE — Progress Notes (Signed)
Patient ID: Katherine Oliver, female    DOB: [birth year], 75 y.o.   MRN: [id number]  Virtual visit completed through [facility], a video enabled telemedicine application. Due to national recommendations of social distancing due to COVID-19, a virtual visit is felt to be most appropriate for this patient at this time. Reviewed limitations, risks, security and privacy concerns of performing a virtual visit and the availability of in person appointments. I also reviewed that there may be a patient responsible charge related to this service. The patient agreed to proceed.   Patient location: home Provider location: [place] at [facility], office Persons participating in this virtual visit: patient, provider, husband Ronalee Belts  If any vitals were documented, they were collected by patient at home unless specified below.    BP (!) 142/86 Comment: before medication  Pulse 70   Ht 5\' 1"  (1.549 m)   Wt 152 lb (68.9 kg)   BMI 28.72 kg/m   BP Readings from Last 3 Encounters:  07/22/21 (!) 142/86  07/16/21 (!) 147/80  07/01/21 110/72    CC: endo f/u visit  Subjective:   HPI: Katherine Oliver is a 75 y.o. female presenting on 07/22/2021 for endocrinology visit discussion   Saw Dr Filbert Schilder endocrinology at [facility] last week - started on lantus 26u daily + humalog 8-22u TID AC (08/11/15), restarted metformin 500mg  - this is still on hold given worsened kidney function. A1c was 8% (07/17/2021). Since insulin dosing changed, notes improvement in sugar control. Using DexCom. She also received a flu shot.   Labs showed Cr 1.3, GFR 42, K 5.1.  Was advised to hold benazepril/hctz for a week and recheck labs.  Home BPs running 116-120/70-80s.   Recurrent UTIs - followed by urology on cranberry tablets and premarin, as well as D-mannose (having difficulty finding this supplement). Recent UTI seen at our office, treated 07/01/2021 with cipro.   She developed ulcer on roof of mouth - treating with  triamcinolone dental past through dentist. Also using L-lysine and vitamin E.   Upcoming IVF filter removal procedure 07/31/2021.      Relevant past medical, surgical, family and social history reviewed and updated as indicated. Interim medical history since our last visit reviewed. Allergies and medications reviewed and updated. Outpatient Medications Prior to Visit  Medication Sig Dispense Refill   metFORMIN (GLUCOPHAGE-XR) 500 MG 24 hr tablet Take 1 tablet by mouth 2 (two) times daily.     acetaminophen (TYLENOL) 325 MG tablet Take 650 mg by mouth every 6 (six) hours as needed for mild pain.     amLODipine (NORVASC) 10 MG tablet Take 1 tablet (10 mg total) by mouth daily. 90 tablet 1   B-D UF III MINI PEN NEEDLES 31G X 5 MM MISC Inject into the skin 4 (four) times daily.     benazepril-hydrochlorthiazide (LOTENSIN HCT) 20-12.5 MG tablet Take 0.5 tablets by mouth daily.     Cholecalciferol (VITAMIN D3) 25 MCG (1000 UT) CAPS Take 1 capsule (1,000 Units total) by mouth daily. 30 capsule    conjugated estrogens (PREMARIN) vaginal cream Estrogen Cream Instruction Discard applicator Apply pea sized amount to tip of finger to urethra before bed. Wash hands well after application. Use Monday, Wednesday and Friday 42.5 g 12   Continuous Blood Gluc Receiver (DEXCOM G6 RECEIVER) DEVI Dispense 1 dexcom receiver     Continuous Blood Gluc Sensor (DEXCOM G6 SENSOR) MISC Dispense 1 box Dexcom G6 sensors (3 sensors per box)     Continuous Blood  Gluc Transmit (DEXCOM G6 TRANSMITTER) MISC Dispense Dexcom G6 Transmitter     CREON 36000-114000 units CPEP capsule SMARTSIG:Capsule(s) By Mouth As Directed     ferrous sulfate 325 (65 FE) MG tablet Take 1 tablet (325 mg total) by mouth daily with breakfast. 90 tablet 3   folic acid (FOLVITE) 1 MG tablet TAKE 1 TABLET BY MOUTH  DAILY 90 tablet 1   glucose blood (ACCU-CHEK AVIVA PLUS) test strip Use as instructed to check blood sugar 4 times daily. 400 strip 3    insulin lispro (HUMALOG) 100 UNIT/ML injection Take 10u with breakfast, 8u with lunch, 16u with dinner plus sliding scale.     LANTUS SOLOSTAR 100 UNIT/ML Solostar Pen Inject 26 Units into the skin at bedtime.     loperamide (IMODIUM A-D) 2 MG tablet Take 2 mg by mouth 4 (four) times daily as needed for diarrhea or loose stools.     meclizine (ANTIVERT) 25 MG tablet Take 1 tablet (25 mg total) by mouth 3 (three) times daily as needed for nausea. 30 tablet 0   metoprolol succinate (TOPROL XL) 50 MG 24 hr tablet Take 1 tablet (50 mg total) by mouth daily. 90 tablet 1   pantoprazole (PROTONIX) 40 MG tablet TAKE 1 TABLET BY MOUTH  DAILY 90 tablet 1   rosuvastatin (CRESTOR) 10 MG tablet Take 1 tablet by mouth daily.     triamcinolone (KENALOG) 0.1 % paste 3 (three) times daily.     benazepril-hydrochlorthiazide (LOTENSIN HCT) 20-12.5 MG tablet Take 1 tablet by mouth daily. 90 tablet 1   insulin lispro (HUMALOG) 100 UNIT/ML injection Inject 10 Units into the skin 3 (three) times daily before meals. Plus sliding scale. Pt takes 12 units on night     LANTUS SOLOSTAR 100 UNIT/ML Solostar Pen Inject 22 Units into the skin at bedtime.     Lidocaine-Benzalkonium (BACTINE) 2.5-0.13 % LIQD Apply topically. As needed     Multiple Vitamin (MULTIVITAMIN) tablet 1 tablet by Per J Tube route daily.     No facility-administered medications prior to visit.     Per HPI unless specifically indicated in ROS section below Review of Systems Objective:  BP (!) 142/86 Comment: before medication  Pulse 70   Ht 5\' 1"  (1.549 m)   Wt 152 lb (68.9 kg)   BMI 28.72 kg/m   Wt Readings from Last 3 Encounters:  07/22/21 152 lb (68.9 kg)  07/16/21 150 lb (68 kg)  07/01/21 153 lb 12 oz (69.7 kg)       Physical exam: Gen: alert, NAD, not ill appearing Pulm: speaks in complete sentences without increased work of breathing Psych: normal mood, normal thought content      Results for orders placed or performed in visit on  07/01/21  Urine Culture   Specimen: Urine  Result Value Ref Range   MICRO NUMBER: [id number]    SPECIMEN QUALITY: Adequate    Sample Source NOT GIVEN    STATUS: FINAL    ISOLATE 1: Escherichia coli (A)       Susceptibility   Escherichia coli - URINE CULTURE, REFLEX    AMOX/CLAVULANIC 4 Sensitive     AMPICILLIN 4 Sensitive     AMPICILLIN/SULBACTAM <=2 Sensitive     CEFAZOLIN* <=4 Not Reportable      * For infections other than uncomplicated UTI caused by E. coli, K. pneumoniae or P. mirabilis: Cefazolin is resistant if MIC > or = 8 mcg/mL. (Distinguishing susceptible versus intermediate for isolates with MIC <  or = 4 mcg/mL requires additional testing.) For uncomplicated UTI caused by E. coli, K. pneumoniae or P. mirabilis: Cefazolin is susceptible if MIC <32 mcg/mL and predicts susceptible to the oral agents cefaclor, cefdinir, cefpodoxime, cefprozil, cefuroxime, cephalexin and loracarbef.     CEFTAZIDIME <=1 Sensitive     CEFEPIME <=1 Sensitive     CEFTRIAXONE <=1 Sensitive     CIPROFLOXACIN <=0.25 Sensitive     LEVOFLOXACIN <=0.12 Sensitive     GENTAMICIN <=1 Sensitive     IMIPENEM <=0.25 Sensitive     NITROFURANTOIN <=16 Sensitive     PIP/TAZO <=4 Sensitive     TOBRAMYCIN <=1 Sensitive     TRIMETH/SULFA* <=20 Sensitive      * For infections other than uncomplicated UTI caused by E. coli, K. pneumoniae or P. mirabilis: Cefazolin is resistant if MIC > or = 8 mcg/mL. (Distinguishing susceptible versus intermediate for isolates with MIC < or = 4 mcg/mL requires additional testing.) For uncomplicated UTI caused by E. coli, K. pneumoniae or P. mirabilis: Cefazolin is susceptible if MIC <32 mcg/mL and predicts susceptible to the oral agents cefaclor, cefdinir, cefpodoxime, cefprozil, cefuroxime, cephalexin and loracarbef. Legend: S = Susceptible  I = Intermediate R = Resistant  NS = Not susceptible * = Not tested  NR = Not reported **NN = See antimicrobic comments    Urinalysis, microscopic only  Result Value Ref Range   WBC, UA TNTC(>50/hpf) (A) 0-2/hpf   RBC / HPF 7-10/hpf (A) 0-2/hpf   Mucus, UA Presence of (A) None   Squamous Epithelial / LPF Few(5-10/hpf) (A) Rare(0-4/hpf)   Bacteria, UA Many(>50/hpf) (A) None  POCT URINALYSIS DIP (CLINITEK)  Result Value Ref Range   Color, UA orange (A) yellow   Clarity, UA clear clear   Glucose, UA negative negative mg/dL   Bilirubin, UA negative negative   Ketones, POC UA negative negative mg/dL   Spec Grav, UA 1.015 1.010 - 1.025   Blood, UA small (A) negative   pH, UA 5.5 5.0 - 8.0   POC PROTEIN,UA =30 (A) negative, trace   Urobilinogen, UA 1.0 0.2 or 1.0 E.U./dL   Nitrite, UA Positive (A) Negative   Leukocytes, UA Small (1+) (A) Negative   Assessment & Plan:   Problem List Items Addressed This Visit     Controlled diabetes mellitus type 2 with complications ([place])    Appreciate endo care.  A1c up to 8%, but now notes improvement with recent insulin dose change      Relevant Medications   metFORMIN (GLUCOPHAGE-XR) 500 MG 24 hr tablet   benazepril-hydrochlorthiazide (LOTENSIN HCT) 20-12.5 MG tablet   insulin lispro (HUMALOG) 100 UNIT/ML injection   LANTUS SOLOSTAR 100 UNIT/ML Solostar Pen   Accelerated hypertension - Primary    Chronic, improved based on home readings - however concern for hyperkalemia and ongoing CKD - I suggested she cut benazepril/hctz in half for 1 wk then recheck labwork at [facility] office.       Relevant Medications   benazepril-hydrochlorthiazide (LOTENSIN HCT) 20-12.5 MG tablet   Other Relevant Orders   Renal function panel   History of recurrent UTIs    Appreciate urology care.  Recently completed UTI treatment with cipro course.      CKD stage 3 due to type 2 diabetes mellitus ([facility])    Chronic, stable. Update renal panel in 1 wk.       Relevant Medications   metFORMIN (GLUCOPHAGE-XR) 500 MG 24 hr tablet   benazepril-hydrochlorthiazide (LOTENSIN HCT)  20-12.5  MG tablet   insulin lispro (HUMALOG) 100 UNIT/ML injection   LANTUS SOLOSTAR 100 UNIT/ML Solostar Pen   Presence of IVC filter    Planning removal next week.         No orders of the defined types were placed in this encounter.  Orders Placed This Encounter  Procedures   Renal function panel    Standing Status:   Future    Standing Expiration Date:   07/22/2022    I discussed the assessment and treatment plan with the patient. The patient was provided an opportunity to ask questions and all were answered. The patient agreed with the plan and demonstrated an understanding of the instructions. The patient was advised to call back or seek an in-person evaluation if the symptoms worsen or if the condition fails to improve as anticipated.  Follow up plan: Return if symptoms worsen or fail to improve.  [name], MD

## 2021-07-22 NOTE — Assessment & Plan Note (Addendum)
Appreciate endo care.  A1c up to 8%, but now notes improvement with recent insulin dose change

## 2021-07-24 ENCOUNTER — Other Ambulatory Visit: Payer: Self-pay

## 2021-07-24 MED ORDER — ROSUVASTATIN CALCIUM 10 MG PO TABS: 10.0000 mg | ORAL_TABLET | Freq: Every day | ORAL | 0 refills | Status: DC

## 2021-07-25 DIAGNOSIS — Z09 Encounter for follow-up examination after completed treatment for conditions other than malignant neoplasm: Secondary | ICD-10-CM | POA: Diagnosis not present

## 2021-07-25 DIAGNOSIS — H02045 Spastic entropion of left lower eyelid: Secondary | ICD-10-CM | POA: Diagnosis not present

## 2021-07-25 DIAGNOSIS — H119 Unspecified disorder of conjunctiva: Secondary | ICD-10-CM | POA: Diagnosis not present

## 2021-07-29 ENCOUNTER — Other Ambulatory Visit (INDEPENDENT_AMBULATORY_CARE_PROVIDER_SITE_OTHER): Payer: Medicare Other

## 2021-07-29 ENCOUNTER — Other Ambulatory Visit: Payer: Self-pay | Admitting: Radiology

## 2021-07-29 ENCOUNTER — Other Ambulatory Visit: Payer: Self-pay

## 2021-07-29 DIAGNOSIS — I10 Essential (primary) hypertension: Secondary | ICD-10-CM | POA: Diagnosis not present

## 2021-07-29 LAB — RENAL FUNCTION PANEL
Albumin: 4.6 g/dL (ref 3.5–5.2)
BUN: 32 mg/dL — ABNORMAL HIGH (ref 6–23)
CO2: 24 mEq/L (ref 19–32)
Calcium: 9.4 mg/dL (ref 8.4–10.5)
Chloride: 105 mEq/L (ref 96–112)
Creatinine, Ser: 1.14 mg/dL (ref 0.40–1.20)
GFR: 46.61 mL/min — ABNORMAL LOW (ref >60.00)
Glucose, Bld: 224 mg/dL — ABNORMAL HIGH (ref 70–99)
Phosphorus: 3.1 mg/dL (ref 2.3–4.6)
Potassium: 4.9 mEq/L (ref 3.5–5.1)
Sodium: 138 mEq/L (ref 135–145)

## 2021-07-30 ENCOUNTER — Other Ambulatory Visit: Payer: Self-pay | Admitting: Radiology

## 2021-07-30 ENCOUNTER — Other Ambulatory Visit: Payer: Self-pay | Admitting: Family Medicine

## 2021-07-30 DIAGNOSIS — Z1231 Encounter for screening mammogram for malignant neoplasm of breast: Secondary | ICD-10-CM

## 2021-07-31 ENCOUNTER — Encounter (HOSPITAL_COMMUNITY): Payer: Self-pay

## 2021-07-31 ENCOUNTER — Ambulatory Visit (HOSPITAL_COMMUNITY)
Admission: RE | Admit: 2021-07-31 | Discharge: 2021-07-31 | Disposition: A | Discharge status: Home / Self Care | Payer: Medicare Other | Source: Ambulatory Visit | Attending: Diagnostic Radiology | Admitting: Diagnostic Radiology

## 2021-07-31 ENCOUNTER — Other Ambulatory Visit: Payer: Self-pay

## 2021-07-31 DIAGNOSIS — I82409 Acute embolism and thrombosis of unspecified deep veins of unspecified lower extremity: Secondary | ICD-10-CM | POA: Diagnosis not present

## 2021-07-31 DIAGNOSIS — E119 Type 2 diabetes mellitus without complications: Secondary | ICD-10-CM | POA: Insufficient documentation

## 2021-07-31 DIAGNOSIS — I10 Essential (primary) hypertension: Secondary | ICD-10-CM | POA: Diagnosis not present

## 2021-07-31 DIAGNOSIS — Z79899 Other long term (current) drug therapy: Secondary | ICD-10-CM | POA: Diagnosis not present

## 2021-07-31 DIAGNOSIS — Z95828 Presence of other vascular implants and grafts: Secondary | ICD-10-CM

## 2021-07-31 DIAGNOSIS — Z881 Allergy status to other antibiotic agents status: Secondary | ICD-10-CM | POA: Insufficient documentation

## 2021-07-31 DIAGNOSIS — Z91048 Other nonmedicinal substance allergy status: Secondary | ICD-10-CM | POA: Insufficient documentation

## 2021-07-31 DIAGNOSIS — Z794 Long term (current) use of insulin: Secondary | ICD-10-CM | POA: Insufficient documentation

## 2021-07-31 DIAGNOSIS — Z4589 Encounter for adjustment and management of other implanted devices: Secondary | ICD-10-CM | POA: Insufficient documentation

## 2021-07-31 DIAGNOSIS — Z86718 Personal history of other venous thrombosis and embolism: Secondary | ICD-10-CM | POA: Insufficient documentation

## 2021-07-31 DIAGNOSIS — Z882 Allergy status to sulfonamides status: Secondary | ICD-10-CM | POA: Diagnosis not present

## 2021-07-31 HISTORY — PX: IR IVC FILTER RETRIEVAL / S&I /IMG GUID/MOD SED: IMG5308

## 2021-07-31 LAB — CBC WITH DIFFERENTIAL/PLATELET
Abs Immature Granulocytes: 0.02 10*3/uL (ref 0.00–0.07)
Basophils Absolute: 0 10*3/uL (ref 0.0–0.1)
Basophils Relative: 1 %
Eosinophils Absolute: 0 10*3/uL (ref 0.0–0.5)
Eosinophils Relative: 0 %
HCT: 40.4 % (ref 36.0–46.0)
Hemoglobin: 12.8 g/dL (ref 12.0–15.0)
Immature Granulocytes: 0 %
Lymphocytes Relative: 19 %
Lymphs Abs: 1 10*3/uL (ref 0.7–4.0)
MCH: 26.9 pg (ref 26.0–34.0)
MCHC: 31.7 g/dL (ref 30.0–36.0)
MCV: 85.1 fL (ref 80.0–100.0)
Monocytes Absolute: 0.5 10*3/uL (ref 0.1–1.0)
Monocytes Relative: 9 %
Neutro Abs: 3.9 10*3/uL (ref 1.7–7.7)
Neutrophils Relative %: 71 %
Platelets: 95 10*3/uL — ABNORMAL LOW (ref 150–400)
RBC: 4.75 MIL/uL (ref 3.87–5.11)
RDW: 13.5 % (ref 11.5–15.5)
WBC: 5.4 10*3/uL (ref 4.0–10.5)
nRBC: 0 % (ref 0.0–0.2)

## 2021-07-31 LAB — BASIC METABOLIC PANEL
Anion gap: 9 (ref 5–15)
BUN: 28 mg/dL — ABNORMAL HIGH (ref 8–23)
CO2: 23 mmol/L (ref 22–32)
Calcium: 9.5 mg/dL (ref 8.9–10.3)
Chloride: 107 mmol/L (ref 98–111)
Creatinine, Ser: 1.06 mg/dL — ABNORMAL HIGH (ref 0.44–1.00)
GFR, Estimated: 54 mL/min — ABNORMAL LOW (ref >60)
Glucose, Bld: 203 mg/dL — ABNORMAL HIGH (ref 70–99)
Potassium: 4.5 mmol/L (ref 3.5–5.1)
Sodium: 139 mmol/L (ref 135–145)

## 2021-07-31 LAB — PROTIME-INR
INR: 1 (ref 0.8–1.2)
Prothrombin Time: 12.9 seconds (ref 11.4–15.2)

## 2021-07-31 LAB — GLUCOSE, CAPILLARY: Glucose-Capillary: 229 mg/dL — ABNORMAL HIGH (ref 70–99)

## 2021-07-31 MED ORDER — LIDOCAINE HCL 1 % IJ SOLN
INTRAMUSCULAR | Status: AC
  Filled 2021-07-31: qty 20

## 2021-07-31 MED ORDER — LIDOCAINE HCL (PF) 1 % IJ SOLN
INTRAMUSCULAR | Status: DC | PRN
  Administered 2021-07-31: 10 mL

## 2021-07-31 MED ORDER — MIDAZOLAM HCL 2 MG/2ML IJ SOLN
INTRAMUSCULAR | Status: DC | PRN
  Administered 2021-07-31: 1 mg via INTRAVENOUS

## 2021-07-31 MED ORDER — SODIUM CHLORIDE 0.9 % IV SOLN: INTRAVENOUS | Status: DC

## 2021-07-31 MED ORDER — FENTANYL CITRATE (PF) 100 MCG/2ML IJ SOLN
INTRAMUSCULAR | Status: DC | PRN
  Administered 2021-07-31: 25 ug via INTRAVENOUS

## 2021-07-31 MED ORDER — HYDROCODONE-ACETAMINOPHEN 5-325 MG PO TABS: 1.0000 | ORAL_TABLET | ORAL | Status: DC | PRN

## 2021-07-31 MED ORDER — MIDAZOLAM HCL 2 MG/2ML IJ SOLN
INTRAMUSCULAR | Status: AC
  Filled 2021-07-31: qty 2

## 2021-07-31 MED ORDER — IOHEXOL 240 MG/ML SOLN
50.0000 mL | Freq: Once | INTRAMUSCULAR | Status: AC | PRN
  Administered 2021-07-31: 45 mL via INTRAVENOUS

## 2021-07-31 MED ORDER — FENTANYL CITRATE (PF) 100 MCG/2ML IJ SOLN
INTRAMUSCULAR | Status: AC
  Filled 2021-07-31: qty 2

## 2021-07-31 NOTE — Procedures (Signed)
Interventional Radiology Procedure:   Indications: IVC filter is no longer needed.  Procedure: IVC venogram and filter retrieval  Findings: Complete removal of filter.  Patent IVC.  Complications: No immediate complications noted.     EBL: Minimal  Plan: Bedrest 2 hours, then discharge to home.   Efton Thomley R. Anselm Pancoast, MD  Pager: [phone]

## 2021-07-31 NOTE — H&P (Signed)
Chief Complaint: Patient was seen in consultation today for IVC filter retrieval  Supervising Physician: Markus Daft  Patient Status: [facility] - Out-pt  History of Present Illness: Katherine Oliver is a 75 y.o. female with past medical history of necrotizing pancreatitis, DM, and  HTN.  She underwent IVC filter placement  in June 2021 due to right lower extremity DVT because the patient could not be anticoagulated due to anemia and blood loss. She has had repeat dopplers since this time with no evidence of current DVT.  She is not currently on anticoagulation. She recently met with Dr. Anselm Pancoast in consultation 06/19/21 to discuss IVC filter removal.  She is appropriate for the procedure and presents today for same.  Denies new complaints or concerns. She has been NPO.  Her husband is available for transportation and care at home as needed.  She is agreeable to proceed.   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 nephropathy ([place])    DSME at [facility] 01/2016    DKA (diabetic ketoacidoses) 04/22/2020   Frequent epistaxis 05/16/2019   S/p cauterization with resolution 2020   GERD (gastroesophageal reflux disease)    Hepatic steatosis    by abd [place] 05/2012, mild transaminitis - normal iron sat and viral hep panel (2011), stable [place] 2017   History of chicken pox    History of measles    History of recurrent UTIs    on chronic keflex   HLD (hyperlipidemia)    HTN (hypertension)    Hypertensive retinopathy of both eyes, 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   [place]   Kidney cyst, acquired 01/2016   L kidney by US   Kidney stone 01/2016   L kidney by [place]   Lung nodules 11/2013   overall stable on f/u CT 01/2016   Osteopenia 06/2013   mild, forearm T -1.1, hip and spine WNL   Pancreatitis    Polycythemia    mild,  stable (2013)   Primary localized osteoarthritis of right knee 01/05/2019   Rosacea    metrogel    Past Surgical History:  Procedure Laterality Date   [place]  12/14/2019   Procedure: BILIARY STENT PLACEMENT;  Surgeon: [name], MD;  Location: [facility];  Service: Endoscopy;;   BREAST BIOPSY Right 1963   benign   BREAST BIOPSY Right 08/2014   benign- core   cardiolite stress test  04/2004   normal   CESAREAN SECTION  7062;3762   x2   CHOLECYSTECTOMY  2003   COLONOSCOPY  09/26/2008   adenomatous polyp, rpt 3-5 yrs   COLONOSCOPY  08/2012   adenomatous polyps, diverticulosis, rec rpt 5 yrs [name])   COLONOSCOPY WITH PROPOFOL N/A 02/05/2018   4TA, SSA, diverticulosis, rpt 3 yrs [name], [name], MD)   dexa  2003   normal   dexa  06/2013   [facility] - Tscore -1.1 forearm, normal spine and femur   ERCP N/A 12/14/2019   Procedure: ENDOSCOPIC RETROGRADE CHOLANGIOPANCREATOGRAPHY (ERCP);  Surgeon: [name], MD;  Location: [place];  Service: Endoscopy;  Laterality: N/A;   ERCP  01/2020   nonbleeding gastric ulcer - [facility] hospitalization (Dr Mont Dutton)   ESOPHAGOGASTRODUODENOSCOPY N/A 12/19/2019   Procedure: ESOPHAGOGASTRODUODENOSCOPY (EGD);  Surgeon: [name],  MD;  Location: [facility]OSCOPY;  Service: Endoscopy;  Laterality: N/A;   ESOPHAGOGASTRODUODENOSCOPY  01/2020   pre existing AXIOS cystogastrostomy stent s/p necrosectomy - [facility] hospitalization (Dr Mont Dutton)   ESOPHAGOGASTRODUODENOSCOPY N/A 04/24/2020   Procedure: ESOPHAGOGASTRODUODENOSCOPY (EGD);  Surgeon: Wonda Horner, MD;  Location: [facility] ENDOSCOPY;  Service: Endoscopy;  Laterality: N/A;   ESOPHAGOGASTRODUODENOSCOPY  05/2020   normal esophagus, duodenum, erythematous mucosa in gastric body, gastric cystogastrostomy stent removed ([facility])   IR FLUORO GUIDE CV LINE RIGHT  12/27/2019   IR FLUORO GUIDE CV LINE RIGHT  04/24/2020   IR IVC FILTER PLMT / S&I /IMG GUID/MOD SED  04/26/2020   IR  RADIOLOGIST EVAL & MGMT  07/31/2020   IR RADIOLOGIST EVAL & MGMT  01/15/2021   IR RADIOLOGIST EVAL & MGMT  06/19/2021   IR REMOVAL TUN CV CATH W/O FL  01/03/2020   IR REMOVAL TUN CV CATH W/O FL  04/29/2020   IR REPLC DUODEN/JEJUNO TUBE PERCUT W/FLUORO  04/01/2020   IR US GUIDE VASC ACCESS RIGHT  04/24/2020   SPHINCTEROTOMY  12/14/2019   Procedure: SPHINCTEROTOMY;  Surgeon: [name], MD;  Location: [place];  Service: Endoscopy;;   TOTAL KNEE ARTHROPLASTY Right 01/17/2019   Procedure: TOTAL KNEE ARTHROPLASTY;  Surgeon: [name], MD;  Location: [facility];  Service: Orthopedics;  Laterality: Right;   TRANSTHORACIC ECHOCARDIOGRAM  04/2019   EF 55-60%, modLVH, impaired relaxation    TRIGGER FINGER RELEASE  2007;2010;2011   bilateral   TRIGGER FINGER RELEASE  02/2017   VAGINAL HYSTERECTOMY  1984   for menorrhagia, ovaries in place    Allergies: Azithromycin, Nickel, Sulfa antibiotics, Vinegar [acetic acid], Adhesive [tape], and Keflex [cephalexin]  Medications: Prior to Admission medications   Medication Sig Start Date End Date Taking? Authorizing Provider  acetaminophen (TYLENOL) 500 MG tablet Take 500 mg by mouth 2 (two) times daily.   Yes [provider]  amLODipine (NORVASC) 10 MG tablet Take 1 tablet (10 mg total) by mouth daily. Patient taking differently: Take 10 mg by mouth every evening. 04/03/21  Yes [name], MD  B-D UF III MINI PEN NEEDLES 31G X 5 MM MISC Inject into the skin 4 (four) times daily. 12/27/20  Yes [provider]  benazepril-hydrochlorthiazide (LOTENSIN HCT) 20-12.5 MG tablet Take 0.5 tablets by mouth daily. 07/22/21  Yes [name], MD  Carboxymethylcellul-Glycerin (LUBRICATING EYE DROPS OP) Place 1 drop into both eyes 3 (three) times daily as needed (dry eyes).   Yes [provider]  Cholecalciferol (VITAMIN D3) 25 MCG (1000 UT) CAPS Take 1 capsule (1,000 Units total) by mouth daily. 10/02/20  Yes [name], MD   conjugated estrogens (PREMARIN) vaginal cream Estrogen Cream Instruction Discard applicator Apply pea sized amount to tip of finger to urethra before bed. Wash hands well after application. Use Monday, Wednesday and Friday 12/12/20  Yes Billey Co, MD  Continuous Blood Gluc Sensor (DEXCOM G6 SENSOR) MISC Dispense 1 box Dexcom G6 sensors (3 sensors per box) 12/26/20  Yes [provider]  CRANBERRY PO Take 4,200 mg by mouth 2 (two) times daily.   Yes [provider]  CREON 36000-114000 units CPEP capsule Take 72,000 Units by mouth 3 (three) times daily with meals. 05/25/20  Yes [provider]  D-MANNOSE PO Take 2 tablets by mouth daily with lunch.   Yes [provider]  diphenhydrAMINE (BENADRYL) 25 MG tablet Take 25 mg by mouth 2 (two) times daily as needed for allergies.   Yes [provider]  ferrous sulfate 325 (65 FE) MG tablet Take 1 tablet (325 mg total) by mouth daily with breakfast. 07/02/20  Yes [name], MD  fluticasone [facility]) 50 MCG/ACT nasal spray Place 1 spray into both nostrils daily.   Yes [provider]  folic acid (FOLVITE) 1 MG tablet TAKE 1 TABLET BY MOUTH  DAILY 05/13/21  Yes [name], MD  insulin lispro (HUMALOG) 100 UNIT/ML injection Take 10u with breakfast, 8u with lunch, 16u with dinner plus sliding scale. If 200-250 add 2 units, 251-300 = add 4 units, >300 = add 6 units 07/22/21  Yes [name], MD  LANTUS SOLOSTAR 100 UNIT/ML Solostar Pen Inject 26 Units into the skin at bedtime. 07/22/21  Yes [name], MD  loperamide (IMODIUM A-D) 2 MG tablet Take 4 mg by mouth 4 (four) times daily as needed for diarrhea or loose stools.   Yes [provider]  LYSINE PO Take 600 mg by mouth daily.   Yes [provider]  meclizine (ANTIVERT) 25 MG tablet Take 1 tablet (25 mg total) by mouth 3 (three) times daily as needed for nausea. 03/13/20  Yes Nicole Kindred A, DO  metoprolol  succinate (TOPROL XL) 50 MG 24 hr tablet Take 1 tablet (50 mg total) by mouth daily. 04/03/21 04/03/22 Yes [name], MD  Multiple Vitamin (MULTIVITAMIN WITH MINERALS) TABS tablet Take 1 tablet by mouth daily.   Yes [provider]  pantoprazole (PROTONIX) 40 MG tablet TAKE 1 TABLET BY MOUTH  DAILY Patient taking differently: Take 40 mg by mouth every evening. 05/13/21  Yes [name], MD  rosuvastatin (CRESTOR) 10 MG tablet Take 1 tablet (10 mg total) by mouth daily. Patient taking differently: Take 10 mg by mouth every evening. 07/24/21 07/24/22 Yes [name], MD  amoxicillin (AMOXIL) 500 MG capsule Take 2,000 mg by mouth See admin instructions. Take 2000 mg 1 hour prior to dental work    [provider]  Continuous Blood Gluc Receiver ([place]) [place] 1 dexcom receiver 12/26/20   [provider]  Continuous Blood Gluc Transmit (DEXCOM G6 TRANSMITTER) MISC Dispense Dexcom G6 Transmitter 12/26/20   [provider]  erythromycin ophthalmic ointment Place 1 application into the left eye at bedtime. 07/25/21   [provider]  Glucagon (BAQSIMI ONE PACK) 3 MG/DOSE POWD Place 3 mg into the nose daily as needed (hypoglycemia).    [provider]  glucose blood (ACCU-CHEK AVIVA PLUS) test strip Use as instructed to check blood sugar 4 times daily. 01/01/21   [name], MD  vitamin E 180 MG (400 UNITS) capsule Take 400 Units by mouth daily.    [provider]     Family History  Problem Relation Age of Onset   Stroke Mother        several   Hyperlipidemia Mother    Hypertension Mother    Cancer Father        colon   Hypertension Father    Hyperlipidemia Father    Coronary artery disease Father 38       MIx1, CABG   Cancer Paternal Aunt        abdominal   Coronary artery disease Maternal Grandmother    Diabetes Maternal Grandfather    Coronary artery disease Maternal Grandfather    Breast  cancer Neg Hx     Social History   Socioeconomic History   Marital status: Married    Spouse name: Not on file   Number  of children: Not on file   Years of education: Not on file   Highest education level: Not on file  Occupational History   Not on file  Tobacco Use   Smoking status: Never   Smokeless tobacco: Never  Vaping Use   Vaping Use: Never used  Substance and Sexual Activity   Alcohol use: Not Currently   Drug use: No   Sexual activity: Yes  Other Topics Concern   Not on file  Social History Narrative   B+ blood type   Caffeine: 2 cups coffee/day   Lives with husband, no pets, grown children ([place] and [place])   Occupation: retired Pharmacist, 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 Daily walking about 20 min.    Diet: ok water intake 4 glasses/day, daily fruits/vegetables, red meat 4x/wk, fish 3-4x/wk   Social Determinants of Health   Financial Resource Strain: Low Risk    Difficulty of Paying Living Expenses: Not hard at all  Food Insecurity: No Food Insecurity   Worried About Charity fundraiser in the Last Year: Never true   Ran Out of Food in the Last Year: Never true  Transportation Needs: No Transportation Needs   Lack of Transportation (Medical): No   Lack of Transportation (Non-Medical): No  Physical Activity: Inactive   Days of Exercise per Week: 0 days   Minutes of Exercise per Session: 0 min  Stress: No Stress Concern Present   Feeling of Stress : Not at all  Social Connections: Not on file     Review of Systems: A 12 point ROS discussed and pertinent positives are indicated in the HPI above.  All other systems are negative.  Review of Systems  Constitutional:  Negative for fatigue and fever.  Respiratory:  Negative for cough.   Cardiovascular:  Negative for chest pain.  Gastrointestinal:  Negative for abdominal pain, diarrhea, nausea and vomiting.  Musculoskeletal:  Negative for back  pain.  Psychiatric/Behavioral:  Negative for behavioral problems and confusion.    Vital Signs: BP (!) 154/75   Pulse 64   Temp 98.2 F (36.8 C) (Oral)   Resp 15   Ht 5' 1" (1.549 m)   Wt 150 lb (68 kg)   SpO2 97%   BMI 28.34 kg/m   Physical Exam Vitals and nursing note reviewed.  Constitutional:      General: She is not in acute distress.    Appearance: Normal appearance. She is not ill-appearing.  HENT:     Mouth/Throat:     Mouth: Mucous membranes are moist.     Pharynx: Oropharynx is clear.  Cardiovascular:     Rate and Rhythm: Normal rate and regular rhythm.  Pulmonary:     Effort: Pulmonary effort is normal. No respiratory distress.     Breath sounds: Normal breath sounds.  Abdominal:     General: Abdomen is flat.     Palpations: Abdomen is soft.  Musculoskeletal:     Cervical back: Normal range of motion and neck supple.  Skin:    General: Skin is warm and dry.  Neurological:     General: No focal deficit present.     Mental Status: She is alert and oriented to person, place, and time. Mental status is at baseline.  Psychiatric:        Mood and Affect: Mood normal.        Behavior: Behavior normal.  Thought Content: Thought content normal.        Judgment: Judgment normal.     MD Evaluation Airway: WNL Heart: WNL Abdomen: WNL Chest/ Lungs: WNL ASA  Classification: 3 Mallampati/Airway Score: Two   Imaging: No results found.  Labs:  CBC: Recent Labs    09/25/20 0922  WBC 5.3  HGB 12.6  HCT 38.0  PLT 102.0*    COAGS: No results for input(s): INR, APTT in the last 8760 hours.  BMP: Recent Labs    10/18/20 0811 11/13/20 0848 01/08/21 0844 07/29/21 1054  NA 139 139 139 138  K 5.1 4.4 4.9 4.9  CL 108 105 103 105  CO2 _0 GLUCOSE 191* 173* 209* 224*  BUN 35* 29* 33* 32*  CALCIUM 9.9 10.0 10.2 9.4  CREATININE 1.41* 1.05 1.24* 1.14    LIVER FUNCTION TESTS: Recent Labs    09/25/20 0922 10/18/20 0811  11/13/20 0848 07/29/21 1054  BILITOT 0.6  --   --   --   AST 12  --   --   --   ALT 12  --   --   --   ALKPHOS 104  --   --   --   PROT 7.1  --   --   --   ALBUMIN 4.5 4.5 4.8 4.6    TUMOR MARKERS: No results for input(s): AFPTM, CEA, CA199, CHROMGRNA in the last 8760 hours.  Assessment and Plan: Patient with past medical history of necrotizing pancreatitis, DVT s/p IVC filter placement presents with resolve of her lower extremity DVT who is no longer in need of her IVC filter.  IR consulted for removal at the request of Dr. Leo Grosser, her PCP. Case reviewed by Dr. Anselm Pancoast who approves patient for procedure and has met with her in consultation.  Patient presents today in their usual state of health.  She has been NPO and is not currently on blood thinners.   Risks and benefits discussed with the patient including, but not limited to bleeding, infection, contrast induced renal failure, filter fracture or migration which can lead to emergency surgery or even death, strut penetration with damage or irritation to adjacent structures and caval thrombosis.  All of the patient's questions were answered, patient is agreeable to proceed. Consent signed and in chart.  Thank you for this interesting consult.  I greatly enjoyed meeting Trina Asch Niedermeier and look forward to participating in their care.  A copy of this report was sent to the requesting provider on this date.  Electronically Signed: Docia Barrier, PA 07/31/2021, 8:38 AM   I spent a total of  30 Minutes   in face to face in clinical consultation, greater than 50% of which was counseling/coordinating care for DVT, IVC filter retrieval.

## 2021-08-07 ENCOUNTER — Other Ambulatory Visit (HOSPITAL_COMMUNITY)
Admission: RE | Admit: 2021-08-07 | Discharge: 2021-08-07 | Disposition: A | Discharge status: Home / Self Care | Payer: Medicare Other | Source: Ambulatory Visit | Attending: Ophthalmology | Admitting: Ophthalmology

## 2021-08-07 DIAGNOSIS — H16212 Exposure keratoconjunctivitis, left eye: Secondary | ICD-10-CM | POA: Diagnosis not present

## 2021-08-07 DIAGNOSIS — H119 Unspecified disorder of conjunctiva: Secondary | ICD-10-CM | POA: Diagnosis not present

## 2021-08-07 DIAGNOSIS — H1189 Other specified disorders of conjunctiva: Secondary | ICD-10-CM | POA: Diagnosis not present

## 2021-08-07 DIAGNOSIS — H02035 Senile entropion of left lower eyelid: Secondary | ICD-10-CM | POA: Diagnosis not present

## 2021-08-12 LAB — SURGICAL PATHOLOGY

## 2021-08-19 ENCOUNTER — Ambulatory Visit (INDEPENDENT_AMBULATORY_CARE_PROVIDER_SITE_OTHER): Payer: Medicare Other

## 2021-08-19 DIAGNOSIS — E118 Type 2 diabetes mellitus with unspecified complications: Secondary | ICD-10-CM

## 2021-08-19 DIAGNOSIS — I10 Essential (primary) hypertension: Secondary | ICD-10-CM

## 2021-08-19 DIAGNOSIS — Z794 Long term (current) use of insulin: Secondary | ICD-10-CM

## 2021-08-19 NOTE — Chronic Care Management (AMB) (Signed)
Chronic Care Management   CCM RN Visit Note  08/19/2021 Name: Katherine Oliver MRN: [id number] DOB: 1946-06-21  Subjective: Katherine Oliver is a 75 y.o. year old female who is a primary care patient of [name], MD. The care management team was consulted for assistance with disease management and care coordination needs.    Engaged with patient by telephone for follow up visit in response to provider referral for case management and/or care coordination services.   Consent to Services:  The patient was given information about Chronic Care Management services, agreed to services, and gave verbal consent prior to initiation of services.  Please see initial visit note for detailed documentation.   Patient agreed to services and verbal consent obtained.   Assessment: Review of patient past medical history, allergies, medications, health status, including review of consultants reports, laboratory and other test data, was performed as part of comprehensive evaluation and provision of chronic care management services.   SDOH (Social Determinants of Health) assessments and interventions performed:    CCM Care Plan  Allergies  Allergen Reactions   Azithromycin Itching    Okay if takes benadryl along with it   Nickel     Reaction to cheap earrings cause redness   Sulfa Antibiotics Itching   Vinegar [Acetic Acid] Nausea Only   Adhesive [Tape] Rash    Paper tape only causes blisters   Keflex [Cephalexin] Rash    Outpatient Encounter Medications as of 08/19/2021  Medication Sig Note   acetaminophen (TYLENOL) 500 MG tablet Take 500 mg by mouth 2 (two) times daily.    amLODipine (NORVASC) 10 MG tablet Take 1 tablet (10 mg total) by mouth daily. (Patient taking differently: Take 10 mg by mouth every evening.)    amoxicillin (AMOXIL) 500 MG capsule Take 2,000 mg by mouth See admin instructions. Take 2000 mg 1 hour prior to dental work    B-D UF III MINI PEN NEEDLES  31G X 5 MM MISC Inject into the skin 4 (four) times daily.    benazepril-hydrochlorthiazide (LOTENSIN HCT) 20-12.5 MG tablet Take 0.5 tablets by mouth daily.    Carboxymethylcellul-Glycerin (LUBRICATING EYE DROPS OP) Place 1 drop into both eyes 3 (three) times daily as needed (dry eyes).    Cholecalciferol (VITAMIN D3) 25 MCG (1000 UT) CAPS Take 1 capsule (1,000 Units total) by mouth daily.    conjugated estrogens (PREMARIN) vaginal cream Estrogen Cream Instruction Discard applicator Apply pea sized amount to tip of finger to urethra before bed. Wash hands well after application. Use Monday, Wednesday and Friday    Continuous Blood Gluc Receiver (DEXCOM G6 RECEIVER) DEVI Dispense 1 dexcom receiver    Continuous Blood Gluc Sensor (DEXCOM G6 SENSOR) MISC Dispense 1 box Dexcom G6 sensors (3 sensors per box) 07/31/2021: Removed Sunday 07/29/21   Continuous Blood Gluc Transmit (DEXCOM G6 TRANSMITTER) MISC Dispense Dexcom G6 Transmitter    CRANBERRY PO Take 4,200 mg by mouth 2 (two) times daily.    CREON 36000-114000 units CPEP capsule Take 72,000 Units by mouth 3 (three) times daily with meals.    D-MANNOSE PO Take 2 tablets by mouth daily with lunch.    diphenhydrAMINE (BENADRYL) 25 MG tablet Take 25 mg by mouth 2 (two) times daily as needed for allergies.    erythromycin ophthalmic ointment Place 1 application into the left eye at bedtime.    ferrous sulfate 325 (65 FE) MG tablet Take 1 tablet (325 mg total) by mouth daily with breakfast.    fluticasone (  FLONASE) 50 MCG/ACT nasal spray Place 1 spray into both nostrils daily.    folic acid (FOLVITE) 1 MG tablet TAKE 1 TABLET BY MOUTH  DAILY    Glucagon (BAQSIMI ONE PACK) 3 MG/DOSE POWD Place 3 mg into the nose daily as needed (hypoglycemia).    glucose blood (ACCU-CHEK AVIVA PLUS) test strip Use as instructed to check blood sugar 4 times daily.    insulin lispro (HUMALOG) 100 UNIT/ML injection Take 10u with breakfast, 8u with lunch, 16u with dinner plus  sliding scale. If 200-250 add 2 units, 251-300 = add 4 units, >300 = add 6 units    LANTUS SOLOSTAR 100 UNIT/ML Solostar Pen Inject 26 Units into the skin at bedtime.    loperamide (IMODIUM A-D) 2 MG tablet Take 4 mg by mouth 4 (four) times daily as needed for diarrhea or loose stools.    LYSINE PO Take 600 mg by mouth daily.    meclizine (ANTIVERT) 25 MG tablet Take 1 tablet (25 mg total) by mouth 3 (three) times daily as needed for nausea.    metoprolol succinate (TOPROL XL) 50 MG 24 hr tablet Take 1 tablet (50 mg total) by mouth daily.    Multiple Vitamin (MULTIVITAMIN WITH MINERALS) TABS tablet Take 1 tablet by mouth daily. 07/30/2021: On hold for procedure    pantoprazole (PROTONIX) 40 MG tablet TAKE 1 TABLET BY MOUTH  DAILY (Patient taking differently: Take 40 mg by mouth every evening.)    rosuvastatin (CRESTOR) 10 MG tablet Take 1 tablet (10 mg total) by mouth daily. (Patient taking differently: Take 10 mg by mouth every evening.)    vitamin E 180 MG (400 UNITS) capsule Take 400 Units by mouth daily. 07/30/2021: On hold for procedure    No facility-administered encounter medications on file as of 08/19/2021.    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   Pain involving joint of finger of left hand 10/02/2020   Protein-calorie malnutrition ([place]) 07/02/2020   Clostridioides difficile infection 06/05/2020   Hair loss 06/05/2020   Right femoral vein DVT ([place]) 05/03/2020   Presence of IVC filter 05/03/2020   Hx of epistaxis    Aortic atherosclerosis ([place])    Urinary tract infection with hematuria 03/07/2020   Chronic pancreatitis ([place]) 01/12/2020   Acquired renal cyst of left kidney 01/06/2020   Gastric stress ulcer 01/06/2020   Necrotizing pancreatitis    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[place])    Acute gallstone pancreatitis 12/13/2019   Chronic diarrhea 06/01/2019   Primary localized osteoarthritis of right knee 01/05/2019   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   Hx of adenomatous polyp of colon 12/15/2017   Vulvar dermatitis 12/07/2017   Stress due to illness of family member 08/18/2017   Left sided abdominal pain 02/05/2017   Right hip pain 08/07/2016   CKD stage 3 due to type 2 diabetes mellitus ([place]) 04/04/2016   Trigger ring finger of right hand 04/04/2016   Pulmonary nodules 01/03/2016   Advanced care planning/counseling discussion 07/05/2015   Overweight (BMI 25.0-29.9) 07/05/2015   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   Osteopenia 06/03/2013   Medicare annual wellness visit, subsequent 05/31/2012   Rotator cuff tendonitis, right 03/01/2012   Polycythemia 12/02/2011   Controlled diabetes mellitus type 2 with complications ([place])    Accelerated hypertension    Dyslipidemia associated with type 2 diabetes mellitus ([place])    History of recurrent UTIs    Allergic  rhinitis    GERD (gastroesophageal reflux disease)    Rosacea    NAFLD (nonalcoholic fatty liver disease) 06/03/2010    Conditions to be addressed/monitored:HTN and DMII  Care Plan : RN- Plan of Care  Updates made by Dannielle Karvonen, RN since 08/19/2021 12:00 AM     Problem: Knowledge deficit related to long term care plan for self management of chronic conditions.   Priority: Medium     Long-Range Goal: Management of chronic health conditions ( Diabetes, HTN)   Start Date: 07/10/2020  Expected End Date: 12/31/2021  This Visit's Progress: On track  Recent Progress: On track  Priority: Medium  Note:   Objective:   BP Readings from Last 3 Encounters:  07/01/21 110/72  04/03/21 136/80  01/01/21 128/72  Current Barriers:  Knowledge deficit related to long term care plan for self management of chronic health conditions: Diabetes Type 2 and Hypertension:  Patient reports having visit with endocrinologist, Dr. Allayne Butcher at [facility] on 07/17/2021.  She reports Hgb A1c is 8.0%.  Patient Humulog and Lantas  medications were adjusted.  RNCM reviewed medications with and confirmed Humulog and lantas were updated in EMR.  She states her blood sugars have ranged from 70 to 250. She reports experiencing tiredness and shakiness with hypoglycemic symptoms between 70-80.   RNCM reviewed Rule of 15, Hypoglycemic treatment with patient. Patient verbalized understanding. Patient reports next follow up with endocrinologist is February 2022 due to provider being on maternity leave.  RNCM advised patient to follow up with her primary care provider in the interim for diabetic concerns.  Patient states primary care provider decreased blood pressure medication benazepril dosage due to abnormal liver lab values.  Patient states she continue to monitor blood pressure at home. Reports values ranging from 120's to 130's/ 70's. Case Manager Clinical Goal(s):  patient will attend scheduled medical appointments patient will demonstrate improved adherence to prescribed treatment plan for hypertension and diabetes as evidenced by taking all medications as prescribed, monitoring and recording blood pressure and blood sugars as directed, adhering to low sodium/DASH diet/ Diabetic diet.  Patient will follow up with providers as recommended and report any new or ongoing symptoms or concerns.  Patient will take medications as prescribed patient will verbalize basic understanding of hypertension and diabetes disease process and self health management plan  Interventions:  Collaboration with [name], MD regarding development and update of comprehensive plan of care as evidenced by provider attestation and co-signature Inter-disciplinary care team collaboration (see longitudinal plan of care) Reviewed medications with patient and discussed importance of medication adherence Discussed plans with patient for ongoing care management follow up and provided patient with direct contact information for care management team Provided  patient with ongoing verbal educational related to diabetes/ hypertension management.  Reviewed scheduled/upcoming provider appointments  Advised patient, providing education and rationale, to monitor blood pressure and blood sugars daily and record, calling PCP for findings outside established parameters.  Patient Goals: - Continue to check blood sugars as recommended  and record.  Take blood sugar log to provider office visits - check blood sugar if I feel it is too high or too low - Continue to take your medications as prescribed and refill timely - Follow up with your providers as recommended. (Notify providers of any new or ongoing symptoms) - Follow a low salt/ diabetic diet - Follow Rule of 15 for low blood sugars ( Hypoglycemia)      RULE OF 15 How to treat low blood  sugars (Blood sugar less than 70 mg/dl  Please follow the RULE OF 15 for the treatment of hypoglycemia treatment (When your blood sugars are less than 70 mg/ dl) STEP  1:  Take 15 grams of carbohydrates when your blood sugar is low, which includes:   3-4 glucose tabs or  3-4 oz of juice or regular soda or  One tube of glucose gel STEP 2:  Recheck blood sugar in 15 minutes STEP 3:  If your blood sugar is still low at the 15 minute recheck ---then, go back to STEP 1 and treat again with another 15 grams of carbohydrates Follow Up Plan: The patient has been provided with contact information for the care management team and has been advised to call with any health related questions or concerns.  The care management team will reach out to the patient again over the next 1-2 months.          Plan:The patient has been provided with contact information for the care management team and has been advised to call with any health related questions or concerns.  The care management team will reach out to the patient again over the next 1-2 months. Quinn Plowman RN,BSN,CCM RN Case Manager [place]   [phone]

## 2021-08-19 NOTE — Patient Instructions (Signed)
Visit Information: Thank you for taking the time to speak with me today.  PATIENT GOALS:  Goals Addressed             This Visit's Progress    -Disease Progression Prevented or Minimized   On track    Timeframe:  Long-Range Goal Start Date:     07/09/2021                      Expected End Date:     12/31/2020    Follow up:  10/29/2021             - Continue to check blood sugars as recommended  and record.  Take blood sugar log to provider office visits - check blood sugar if I feel it is too high or too low - Continue to take your medications as prescribed and refill timely - Follow up with your providers as recommended. (Notify providers of any new or ongoing symptoms) - Follow a low salt/ diabetic diet - Follow Rule of 15 for low blood sugars ( Hypoglycemia)      RULE OF 15 How to treat low blood sugars (Blood sugar less than 70 mg/dl  Please follow the RULE OF 15 for the treatment of hypoglycemia treatment (When your blood sugars are less than 70 mg/ dl) STEP  1:  Take 15 grams of carbohydrates when your blood sugar is low, which includes:   3-4 glucose tabs or  3-4 oz of juice or regular soda or  One tube of glucose gel STEP 2:  Recheck blood sugar in 15 minutes STEP 3:  If your blood sugar is still low at the 15 minute recheck ---then, go back to STEP 1 and treat again with another 15 grams of carbohydrates         Patient verbalizes understanding of instructions provided today and agrees to view in [place].   The patient has been provided with contact information for the care management team and has been advised to call with any health related questions or concerns.  The care management team will reach out to the patient again over the next 1-2 months .   Quinn Plowman RN,BSN,CCM RN Case Manager [place]  [phone]

## 2021-08-22 ENCOUNTER — Telehealth: Payer: Medicare Other

## 2021-08-24 ENCOUNTER — Other Ambulatory Visit: Payer: Self-pay | Admitting: Family Medicine

## 2021-08-27 NOTE — Telephone Encounter (Signed)
Lotensin-HCT Refill request came in as one daily. Please confirm that directions were changed on 07/22/21 to 1/2 daily.  See lab results 07/29/21.

## 2021-08-28 DIAGNOSIS — D23122 Other benign neoplasm of skin of left lower eyelid, including canthus: Secondary | ICD-10-CM | POA: Diagnosis not present

## 2021-08-28 DIAGNOSIS — H0289 Other specified disorders of eyelid: Secondary | ICD-10-CM | POA: Diagnosis not present

## 2021-08-28 DIAGNOSIS — H02045 Spastic entropion of left lower eyelid: Secondary | ICD-10-CM | POA: Diagnosis not present

## 2021-08-28 DIAGNOSIS — H16212 Exposure keratoconjunctivitis, left eye: Secondary | ICD-10-CM | POA: Diagnosis not present

## 2021-08-28 DIAGNOSIS — H02035 Senile entropion of left lower eyelid: Secondary | ICD-10-CM | POA: Diagnosis not present

## 2021-08-28 DIAGNOSIS — D2310 Other benign neoplasm of skin of unspecified eyelid, including canthus: Secondary | ICD-10-CM | POA: Diagnosis not present

## 2021-08-28 NOTE — Telephone Encounter (Signed)
ERx lower dose

## 2021-09-02 DIAGNOSIS — I10 Essential (primary) hypertension: Secondary | ICD-10-CM | POA: Diagnosis not present

## 2021-09-02 DIAGNOSIS — Z794 Long term (current) use of insulin: Secondary | ICD-10-CM

## 2021-09-02 DIAGNOSIS — E118 Type 2 diabetes mellitus with unspecified complications: Secondary | ICD-10-CM

## 2021-09-03 ENCOUNTER — Other Ambulatory Visit: Payer: Self-pay

## 2021-09-03 ENCOUNTER — Ambulatory Visit
Admission: RE | Admit: 2021-09-03 | Discharge: 2021-09-03 | Disposition: A | Discharge status: Home / Self Care | Payer: Medicare Other | Source: Ambulatory Visit | Attending: Family Medicine | Admitting: Family Medicine

## 2021-09-03 DIAGNOSIS — Z1231 Encounter for screening mammogram for malignant neoplasm of breast: Secondary | ICD-10-CM | POA: Insufficient documentation

## 2021-09-13 ENCOUNTER — Other Ambulatory Visit: Payer: Self-pay | Admitting: Family Medicine

## 2021-09-13 NOTE — Telephone Encounter (Signed)
New rx for combo pill sent to mail order pharmacy on 08/28/21.

## 2021-09-24 ENCOUNTER — Other Ambulatory Visit: Payer: Self-pay | Admitting: Family Medicine

## 2021-09-25 ENCOUNTER — Other Ambulatory Visit: Payer: Self-pay | Admitting: Family Medicine

## 2021-09-27 ENCOUNTER — Ambulatory Visit: Payer: Medicare Other

## 2021-09-29 NOTE — Progress Notes (Signed)
Subjective:   Katherine Oliver is a 75 y.o. female who presents for Medicare Annual (Subsequent) preventive examination.  I connected with Markisha Meding today by telephone and verified that I am speaking with the correct person using two identifiers. Location patient: home Location provider: work Persons participating in the virtual visit: patient, Marine scientist.    I discussed the limitations, risks, security and privacy concerns of performing an evaluation and management service by telephone and the availability of in person appointments. I also discussed with the patient that there may be a patient responsible charge related to this service. The patient expressed understanding and verbally consented to this telephonic visit.    Interactive audio and video telecommunications were attempted between this provider and patient, however failed, due to patient having technical difficulties OR patient did not have access to video capability.  We continued and completed visit with audio only.  Some vital signs may be absent or patient reported.   Time Spent with patient on telephone encounter: 30 minutes  Review of Systems     Cardiac Risk Factors include: advanced age (>66men, >1 women);diabetes mellitus;hypertension;dyslipidemia     Objective:    Today's Vitals   10/03/21 1357  Weight: 156 lb (70.8 kg)  Height: 5\' 1"  (1.549 m)   Body mass index is 29.48 kg/m.  Advanced Directives 10/03/2021 07/31/2021 07/09/2021 09/25/2020 04/24/2020 04/24/2020 04/23/2020  Does Patient Have a Medical Advance Directive? Yes Yes Yes No - Yes No  Type of Paramedic of [place];Living will [place];Living will [place];Living will - - [place] -  Does patient want to make changes to medical advance directive? Yes (MAU/Ambulatory/Procedural Areas - Information given) No - Patient declined No - Patient declined - No -  Patient declined - -  Copy of [place] in Chart? - - - - - - -  Would patient like information on creating a medical advance directive? - - - Yes (MAU/Ambulatory/Procedural Areas - Information given) - - No - Patient declined    Current Medications (verified) Outpatient Encounter Medications as of 10/03/2021  Medication Sig   acetaminophen (TYLENOL) 500 MG tablet Take 500 mg by mouth 2 (two) times daily.   amLODipine (NORVASC) 10 MG tablet TAKE 1 TABLET BY MOUTH  DAILY   amoxicillin (AMOXIL) 500 MG capsule Take 2,000 mg by mouth See admin instructions. Take 2000 mg 1 hour prior to dental work   B-D UF III MINI PEN NEEDLES 31G X 5 MM MISC Inject into the skin 4 (four) times daily.   benazepril-hydrochlorthiazide (LOTENSIN HCT) 20-12.5 MG tablet Take 0.5 tablets by mouth daily.   Carboxymethylcellul-Glycerin (LUBRICATING EYE DROPS OP) Place 1 drop into both eyes 3 (three) times daily as needed (dry eyes).   Cholecalciferol (VITAMIN D3) 25 MCG (1000 UT) CAPS Take 1 capsule (1,000 Units total) by mouth daily.   conjugated estrogens (PREMARIN) vaginal cream Estrogen Cream Instruction Discard applicator Apply pea sized amount to tip of finger to urethra before bed. Wash hands well after application. Use Monday, Wednesday and Friday   Continuous Blood Gluc Receiver (DEXCOM G6 RECEIVER) DEVI Dispense 1 dexcom receiver   Continuous Blood Gluc Sensor (DEXCOM G6 SENSOR) MISC Dispense 1 box Dexcom G6 sensors (3 sensors per box)   Continuous Blood Gluc Transmit (DEXCOM G6 TRANSMITTER) MISC Dispense Dexcom G6 Transmitter   CRANBERRY PO Take 4,200 mg by mouth 2 (two) times daily.   CRANBERRY-VITAMIN C-D MANNOSE PO Take  1,500 mg by mouth daily.   CREON 36000-114000 units CPEP capsule Take 72,000 Units by mouth 3 (three) times daily with meals.   D-MANNOSE PO Take 2 tablets by mouth daily with lunch.   diphenhydrAMINE (BENADRYL) 25 MG tablet Take 25 mg by mouth 2 (two) times daily as needed  for allergies.   erythromycin ophthalmic ointment Place 1 application into the left eye at bedtime.   ferrous sulfate 325 (65 FE) MG tablet Take 1 tablet (325 mg total) by mouth daily with breakfast.   fluticasone (FLONASE) 50 MCG/ACT nasal spray Place 1 spray into both nostrils daily.   folic acid (FOLVITE) 1 MG tablet TAKE 1 TABLET BY MOUTH  DAILY   Glucagon (BAQSIMI ONE PACK) 3 MG/DOSE POWD Place 3 mg into the nose daily as needed (hypoglycemia).   glucose blood (ACCU-CHEK AVIVA PLUS) test strip Use as instructed to check blood sugar 4 times daily.   insulin lispro (HUMALOG) 100 UNIT/ML injection Take 10u with breakfast, 8u with lunch, 16u with dinner plus sliding scale. If 200-250 add 2 units, 251-300 = add 4 units, >300 = add 6 units   LANTUS SOLOSTAR 100 UNIT/ML Solostar Pen Inject 26 Units into the skin at bedtime.   loperamide (IMODIUM A-D) 2 MG tablet Take 4 mg by mouth 4 (four) times daily as needed for diarrhea or loose stools.   LYSINE PO Take 600 mg by mouth daily.   meclizine (ANTIVERT) 25 MG tablet Take 1 tablet (25 mg total) by mouth 3 (three) times daily as needed for nausea.   metoprolol succinate (TOPROL-XL) 50 MG 24 hr tablet TAKE 1 TABLET BY MOUTH  DAILY   Multiple Vitamin (MULTIVITAMIN WITH MINERALS) TABS tablet Take 1 tablet by mouth daily.   pantoprazole (PROTONIX) 40 MG tablet TAKE 1 TABLET BY MOUTH  DAILY (Patient taking differently: Take 40 mg by mouth every evening.)   rosuvastatin (CRESTOR) 10 MG tablet TAKE 1 TABLET BY MOUTH  DAILY   vitamin E 180 MG (400 UNITS) capsule Take 400 Units by mouth daily.   No facility-administered encounter medications on file as of 10/03/2021.    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 nephropathy ([place])    DSME at [facility] 01/2016    DKA (diabetic ketoacidoses) 04/22/2020   Frequent epistaxis 05/16/2019   S/p cauterization with resolution 2020   GERD (gastroesophageal reflux disease)    Hepatic steatosis    by abd [place] 05/2012, mild transaminitis - normal iron sat and viral hep panel (2011), stable [place] 2017   History of chicken pox    History of measles    History of recurrent UTIs    on chronic keflex   HLD (hyperlipidemia)    HTN (hypertension)    Hypertensive retinopathy of both eyes, 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   [place]   Kidney cyst, acquired 01/2016   L kidney by US   Kidney stone 01/2016   L kidney by [place]   Lung nodules 11/2013   overall stable on f/u CT 01/2016   Osteopenia 06/2013   mild, forearm T -1.1, hip and spine WNL   Pancreatitis    Polycythemia  mild, stable (2013)   Primary localized osteoarthritis of right knee 01/05/2019   Rosacea    metrogel   Past Surgical History:  Procedure Laterality Date   [place]  12/14/2019   Procedure: BILIARY STENT PLACEMENT;  Surgeon: [name], MD;  Location: [facility] ENDOSCOPY;  Service: Endoscopy;;   BREAST BIOPSY Right 1963   benign   BREAST BIOPSY Right 08/2014   benign- core   cardiolite stress test  04/2004   normal   CESAREAN SECTION  [id number]   x2   CHOLECYSTECTOMY  2003   COLONOSCOPY  09/26/2008   adenomatous polyp, rpt 3-5 yrs   COLONOSCOPY  08/2012   adenomatous polyps, diverticulosis, rec rpt 5 yrs [name])   COLONOSCOPY WITH PROPOFOL N/A 02/05/2018   4TA, SSA, diverticulosis, rpt 3 yrs [name], [name], MD)   dexa  2003   normal   dexa  06/2013   [facility] - Tscore -1.1 forearm, normal spine and femur   ERCP N/A 12/14/2019   Procedure: ENDOSCOPIC RETROGRADE CHOLANGIOPANCREATOGRAPHY (ERCP);  Surgeon: [name], MD;  Location: [place];  Service: Endoscopy;  Laterality: N/A;    ERCP  01/2020   nonbleeding gastric ulcer - [facility] hospitalization (Dr Mont Dutton)   ESOPHAGOGASTRODUODENOSCOPY N/A 12/19/2019   Procedure: ESOPHAGOGASTRODUODENOSCOPY (EGD);  Surgeon: [name], MD;  Location: [facility] ENDOSCOPY;  Service: Endoscopy;  Laterality: N/A;   ESOPHAGOGASTRODUODENOSCOPY  01/2020   pre existing AXIOS cystogastrostomy stent s/p necrosectomy - [facility] hospitalization (Dr Mont Dutton)   ESOPHAGOGASTRODUODENOSCOPY N/A 04/24/2020   Procedure: ESOPHAGOGASTRODUODENOSCOPY (EGD);  Surgeon: Wonda Horner, MD;  Location: [facility] ENDOSCOPY;  Service: Endoscopy;  Laterality: N/A;   ESOPHAGOGASTRODUODENOSCOPY  05/2020   normal esophagus, duodenum, erythematous mucosa in gastric body, gastric cystogastrostomy stent removed ([facility])   IR FLUORO GUIDE CV LINE RIGHT  12/27/2019   IR FLUORO GUIDE CV LINE RIGHT  04/24/2020   IR IVC FILTER PLMT / S&I /IMG GUID/MOD SED  04/26/2020   IR IVC FILTER RETRIEVAL / S&I /IMG GUID/MOD SED  07/31/2021   IR RADIOLOGIST EVAL & MGMT  07/31/2020   IR RADIOLOGIST EVAL & MGMT  01/15/2021   IR RADIOLOGIST EVAL & MGMT  06/19/2021   IR REMOVAL TUN CV CATH W/O FL  01/03/2020   IR REMOVAL TUN CV CATH W/O FL  04/29/2020   IR REPLC DUODEN/JEJUNO TUBE PERCUT W/FLUORO  04/01/2020   IR US GUIDE VASC ACCESS RIGHT  04/24/2020   SPHINCTEROTOMY  12/14/2019   Procedure: SPHINCTEROTOMY;  Surgeon: [name], MD;  Location: [place];  Service: Endoscopy;;   TOTAL KNEE ARTHROPLASTY Right 01/17/2019   Procedure: TOTAL KNEE ARTHROPLASTY;  Surgeon: [name], MD;  Location: [facility];  Service: Orthopedics;  Laterality: Right;   TRANSTHORACIC ECHOCARDIOGRAM  04/2019   EF 55-60%, modLVH, impaired relaxation    TRIGGER FINGER RELEASE  2007;2010;2011   bilateral   TRIGGER FINGER RELEASE  02/2017   VAGINAL HYSTERECTOMY  1984   for menorrhagia, ovaries in place   Family History  Problem Relation Age of Onset   Stroke Mother        several   Hyperlipidemia Mother    Hypertension Mother     Cancer Father        colon   Hypertension Father    Hyperlipidemia Father    Coronary artery disease Father 66       MIx1, CABG   Cancer Paternal Aunt        abdominal   Coronary artery disease  Maternal Grandmother    Diabetes Maternal Grandfather    Coronary artery disease Maternal Grandfather    Breast cancer Neg Hx    Social History   Socioeconomic History   Marital status: Married    Spouse name: Not on file   Number of children: Not on file   Years of education: Not on file   Highest education level: Not on file  Occupational History   Not on file  Tobacco Use   Smoking status: Never   Smokeless tobacco: Never  Vaping Use   Vaping Use: Never used  Substance and Sexual Activity   Alcohol use: Not Currently   Drug use: No   Sexual activity: Yes  Other Topics Concern   Not on file  Social History Narrative   B+ blood type   Caffeine: 2 cups coffee/day   Lives with husband, no pets, grown children ([place] and [place])   Occupation: retired Pharmacist, 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 Daily walking about 20 min.    Diet: ok water intake 4 glasses/day, daily fruits/vegetables, red meat 4x/wk, fish 3-4x/wk   Social Determinants of Health   Financial Resource Strain: Low Risk    Difficulty of Paying Living Expenses: Not hard at all  Food Insecurity: No Food Insecurity   Worried About Charity fundraiser in the Last Year: Never true   Ran Out of Food in the Last Year: Never true  Transportation Needs: No Transportation Needs   Lack of Transportation (Medical): No   Lack of Transportation (Non-Medical): No  Physical Activity: Inactive   Days of Exercise per Week: 0 days   Minutes of Exercise per Session: 0 min  Stress: Not on file  Social Connections: Moderately Isolated   Frequency of Communication with Friends and Family: Once a week   Frequency of Social Gatherings with Friends and Family: Three times a  week   Attends Religious Services: Never   Active Member of Clubs or Organizations: No   Attends Archivist Meetings: Never   Marital Status: Married    Tobacco Counseling Counseling given: Not Answered   Clinical Intake:  Pre-visit preparation completed: Yes  Pain : No/denies pain     BMI - recorded: 29.48 Nutritional Status: BMI 25 -29 Overweight Nutritional Risks: None Diabetes: Yes CBG done?: No (visit completed over the phone) Did pt. bring in CBG monitor from home?: No  How often do you need to have someone help you when you read instructions, pamphlets, or other written materials from your doctor or pharmacy?: 1 - Never  Diabetes:  Is the patient diabetic?  Yes  If diabetic, was a CBG obtained today?  No , visit completed over the phone.  Did the patient bring in their glucometer from home?  No , visit completed over the phone. How often do you monitor your CBG's? 4 times daily.   Financial Strains and Diabetes Management:  Are you having any financial strains with the device, your supplies or your medication? No .  Does the patient want to be seen by Chronic Care Management for management of their diabetes?  No  Would the patient like to be referred to a Nutritionist or for Diabetic Management?  No   Diabetic Exams:  Diabetic Eye Exam: Completed 06/2021.  Diabetic Foot Exam: Due,patient has appointment with PCP 10/04/21.   Interpreter Needed?: No  Information entered by :: Orrin Brigham LPN   Activities of  Daily Living In your present state of health, do you have any difficulty performing the following activities: 10/03/2021 07/31/2021  Hearing? N -  Vision? N -  Difficulty concentrating or making decisions? N -  Walking or climbing stairs? N N  Dressing or bathing? N -  Doing errands, shopping? Y -  Comment husband drives -  Conservation officer, nature and eating ? N -  Using the Toilet? N -  In the past six months, have you accidently leaked urine? N -   Do you have problems with loss of bowel control? N -  Managing your Medications? N -  Managing your Finances? N -  Housekeeping or managing your Housekeeping? N -  Some recent data might be hidden    Patient Care Team: [name], MD as PCP - General (Family Medicine) [name], MD as Consulting Physician (Orthopedic Surgery) Ralene Bathe, MD as Consulting Physician (Dermatology) Johnnette Litter, MD as Consulting Physician (Dentistry) Thelma Comp, [place] as Consulting Physician (Optometry) Dannielle Karvonen, RN as [place] any recent Medical Services you may have received from other than [facility] providers in the past year (date may be approximate).     Assessment:   This is a routine wellness examination for Tandra.  Hearing/Vision screen Hearing Screening - Comments:: No issues Vision Screening - Comments:: Last exam 06/2021, Dr. Rick Duff, wears glasses  Dietary issues and exercise activities discussed: Current Exercise Habits: The patient does not participate in regular exercise at present   Goals Addressed             This Visit's Progress    Patient Stated       Would like to exercise and walk more       Depression Screen PHQ 2/9 Scores 10/03/2021 07/09/2021 09/25/2020 05/10/2020 08/23/2019 08/16/2018 08/10/2017  PHQ - 2 Score 0 0 0 0 0 0 0  PHQ- 9 Score - - 0 - 0 0 0    Fall Risk Fall Risk  10/03/2021 07/09/2021 09/25/2020 05/10/2020 08/23/2019  Falls in the past year? 0 0 1 1 0  Comment - - - - -  Number falls in past yr: 0 0 1 0 0  Injury with Fall? 0 - 0 1 0  Risk for fall due to : No Fall Risks - Medication side effect Impaired balance/gait Medication side effect  Follow up Falls prevention discussed - Falls evaluation completed;Falls prevention discussed Falls prevention discussed Falls evaluation completed;Falls prevention discussed  Comment - - - Much improved -    FALL RISK PREVENTION PERTAINING TO  THE HOME:  Any stairs in or around the home? Yes  If so, are there any without handrails? No  Home free of loose throw rugs in walkways, pet beds, electrical cords, etc? Yes  Adequate lighting in your home to reduce risk of falls? Yes   ASSISTIVE DEVICES UTILIZED TO PREVENT FALLS:  Life alert? No  Use of a cane, walker or w/c? No  Grab bars in the bathroom? Yes  Shower chair or bench in shower? Yes  Elevated toilet seat or a handicapped toilet? No   TIMED UP AND GO:  Was the test performed? No , visit completed over the phone.    Cognitive Function: Normal cognitive status assessed by this Nurse Health Advisor. No abnormalities found.   MMSE - Mini Mental State Exam 09/25/2020 08/23/2019 08/16/2018 08/10/2017 07/14/2016  Orientation to time 5 5 5 5 5   Orientation to Place 5 5 5  5 5  Registration 3 3 3 3 3   Attention/ Calculation 5 5 0 0 0  Recall 3 3 3 3 3   Language- name 2 objects - - 0 0 0  Language- repeat 1 1 1 1 1   Language- follow 3 step command - - 3 3 3   Language- read & follow direction - - 0 0 0  Write a sentence - - 0 0 0  Copy design - - 0 0 0  Total score - - 20 20 20         Immunizations Immunization History  Administered Date(s) Administered   Fluad Quad(high Dose 65+) 07/28/2019   Influenza, High Dose Seasonal PF 08/10/2017, 08/16/2018, 08/15/2020   Influenza,inj,Quad PF,6+ Mos 08/02/2014, 07/05/2015, 07/14/2016   Influenza-Unspecified 08/03/2013, 07/17/2021   PFIZER(Purple Top)SARS-COV-2 Vaccination 11/17/2019, 12/08/2019   Pneumococcal Conjugate-13 06/30/2014   Pneumococcal Polysaccharide-23 11/03/2002, 09/20/2007, 05/31/2012   Td 01/20/2005, 08/10/2017   Zoster, Live 11/03/2008    TDAP status: Up to date  Flu Vaccine status: Up to date  Pneumococcal vaccine status: Up to date  Covid-19 vaccine status: Information provided on how to obtain vaccines.   Qualifies for Shingles Vaccine? Yes   Zostavax completed Yes   Shingrix Completed?: No.     Education has been provided regarding the importance of this vaccine. Patient has been advised to call insurance company to determine out of pocket expense if they have not yet received this vaccine. Advised may also receive vaccine at local pharmacy or Health Dept. Verbalized acceptance and understanding.  Screening Tests Health Maintenance  Topic Date Due   Zoster Vaccines- Shingrix (1 of 2) Never done   FOOT EXAM  05/20/2019   COVID-19 Vaccine (3 - Pfizer risk series) 01/05/2020   HEMOGLOBIN A1C  09/26/2021   OPHTHALMOLOGY EXAM  10/01/2021   MAMMOGRAM  09/03/2022   COLONOSCOPY (Pts 45-33yrs Insurance coverage will need to be confirmed)  02/06/2023   TETANUS/TDAP  08/11/2027   Pneumonia Vaccine 24+ Years old  Completed   INFLUENZA VACCINE  Completed   DEXA SCAN  Completed   Hepatitis C Screening  Completed   HPV VACCINES  Aged Out    Health Maintenance  Health Maintenance Due  Topic Date Due   Zoster Vaccines- Shingrix (1 of 2) Never done   FOOT EXAM  05/20/2019   COVID-19 Vaccine (3 - Pfizer risk series) 01/05/2020   HEMOGLOBIN A1C  09/26/2021   OPHTHALMOLOGY EXAM  10/01/2021    Colorectal cancer screening: No longer required.   Mammogram status: Completed 09/03/21. Repeat every year  Bone Density status: Ordered 10/03/21. Pt provided with contact info and advised to call to schedule appt.  Lung Cancer Screening: (Low Dose CT Chest recommended if Age 33-80 years, 30 pack-year currently smoking OR have quit w/in 15years.) does not qualify.     Additional Screening:  Hepatitis C Screening: does qualify; Completed 07/31/16  Vision Screening: Recommended annual ophthalmology exams for early detection of glaucoma and other disorders of the eye. Is the patient up to date with their annual eye exam?  Yes  Who is the provider or what is the name of the office in which the patient attends annual eye exams?  Dr. Rick Duff   Dental Screening: Recommended annual dental  exams for proper oral hygiene  Community Resource Referral / Chronic Care Management: CRR required this visit?  No   CCM required this visit?  No      Plan:     I have personally reviewed and  noted the following in the patient's chart:   Medical and social history Use of alcohol, tobacco or illicit drugs  Current medications and supplements including opioid prescriptions.  Functional ability and status Nutritional status Physical activity Advanced directives List of other physicians Hospitalizations, surgeries, and ER visits in previous 12 months Vitals Screenings to include cognitive, depression, and falls Referrals and appointments  In addition, I have reviewed and discussed with patient certain preventive protocols, quality metrics, and best practice recommendations. A written personalized care plan for preventive services as well as general preventive health recommendations were provided to patient.   Due to this being a telephonic visit, the after visit summary with patients personalized plan was offered to patient via mail or my-chart. Patient would like to access on my-chart.    Loma Messing, LPN   [id number]   Nurse Health Advisor  Nurse Notes: none

## 2021-09-30 ENCOUNTER — Other Ambulatory Visit: Payer: Medicare Other

## 2021-10-03 ENCOUNTER — Ambulatory Visit (INDEPENDENT_AMBULATORY_CARE_PROVIDER_SITE_OTHER): Payer: Medicare Other

## 2021-10-03 VITALS — Ht 61.0 in | Wt 156.0 lb

## 2021-10-03 DIAGNOSIS — Z780 Asymptomatic menopausal state: Secondary | ICD-10-CM | POA: Diagnosis not present

## 2021-10-03 DIAGNOSIS — Z0000 Encounter for general adult medical examination without abnormal findings: Secondary | ICD-10-CM | POA: Diagnosis not present

## 2021-10-03 NOTE — Patient Instructions (Signed)
Katherine Oliver , Thank you for taking time to complete your Medicare Wellness Visit. I appreciate your ongoing commitment to your health goals. Please review the following plan we discussed and let me know if I can assist you in the future.   Screening recommendations/referrals: Colonoscopy: no longer required  Mammogram: up to date, completed 09/03/21, due 09/03/22 Bone Density: due, last completed 09/05/19, ordered today, someone will call to schedule Recommended yearly ophthalmology/optometry visit for glaucoma screening and checkup Recommended yearly dental visit for hygiene and checkup  Vaccinations: Influenza vaccine: up to date Pneumococcal vaccine: up to date Tdap vaccine: up to date, completed 08/10/17, due 08/11/27 Shingles vaccine: Discuss with your local pharmacy  Covid-19: newest booster available at your local pharmacy  Advanced directives: Please bring a copy of Living Will and/or Healthcare Power of Attorney for your chart.   Conditions/risks identified: see problem list  Next appointment: Follow up in one year for your annual wellness visit 10/08/22 @ 12:00pm , this will be a telephone visit   Preventive Care 65 Years and Older, Female Preventive care refers to lifestyle choices and visits with your health care provider that can promote health and wellness. What does preventive care include? A yearly physical exam. This is also called an annual well check. Dental exams once or twice a year. Routine eye exams. Ask your health care provider how often you should have your eyes checked. Personal lifestyle choices, including: Daily care of your teeth and gums. Regular physical activity. Eating a healthy diet. Avoiding tobacco and drug use. Limiting alcohol use. Practicing safe sex. Taking low-dose aspirin every day. Taking vitamin and mineral supplements as recommended by your health care provider. What happens during an annual well check? The services and screenings  done by your health care provider during your annual well check will depend on your age, overall health, lifestyle risk factors, and family history of disease. Counseling  Your health care provider may ask you questions about your: Alcohol use. Tobacco use. Drug use. Emotional well-being. Home and relationship well-being. Sexual activity. Eating habits. History of falls. Memory and ability to understand (cognition). Work and work Statistician. Reproductive health. Screening  You may have the following tests or measurements: Height, weight, and BMI. Blood pressure. Lipid and cholesterol levels. These may be checked every 5 years, or more frequently if you are over 58 years old. Skin check. Lung cancer screening. You may have this screening every year starting at age 15 if you have a 30-pack-year history of smoking and currently smoke or have quit within the past 15 years. Fecal occult blood test (FOBT) of the stool. You may have this test every year starting at age 15. Flexible sigmoidoscopy or colonoscopy. You may have a sigmoidoscopy every 5 years or a colonoscopy every 10 years starting at age 27. Hepatitis C blood test. Hepatitis B blood test. Sexually transmitted disease (STD) testing. Diabetes screening. This is done by checking your blood sugar (glucose) after you have not eaten for a while (fasting). You may have this done every 1-3 years. Bone density scan. This is done to screen for osteoporosis. You may have this done starting at age 54. Mammogram. This may be done every 1-2 years. Talk to your health care provider about how often you should have regular mammograms. Talk with your health care provider about your test results, treatment options, and if necessary, the need for more tests. Vaccines  Your health care provider may recommend certain vaccines, such as: Influenza vaccine. This is  recommended every year. Tetanus, diphtheria, and acellular pertussis (Tdap, Td)  vaccine. You may need a Td booster every 10 years. Zoster vaccine. You may need this after age 53. Pneumococcal 13-valent conjugate (PCV13) vaccine. One dose is recommended after age 70. Pneumococcal polysaccharide (PPSV23) vaccine. One dose is recommended after age 77. Talk to your health care provider about which screenings and vaccines you need and how often you need them. This information is not intended to replace advice given to you by your health care provider. Make sure you discuss any questions you have with your health care provider. Document Released: 11/16/2015 Document Revised: 07/09/2016 Document Reviewed: 08/21/2015 Elsevier Interactive Patient Education  2017 [place] Prevention in the Home Falls can cause injuries. They can happen to people of all ages. There are many things you can do to make your home safe and to help prevent falls. What can I do on the outside of my home? Regularly fix the edges of walkways and driveways and fix any cracks. Remove anything that might make you trip as you walk through a door, such as a raised step or threshold. Trim any bushes or trees on the path to your home. Use bright outdoor lighting. Clear any walking paths of anything that might make someone trip, such as rocks or tools. Regularly check to see if handrails are loose or broken. Make sure that both sides of any steps have handrails. Any raised decks and porches should have guardrails on the edges. Have any leaves, snow, or ice cleared regularly. Use sand or salt on walking paths during winter. Clean up any spills in your garage right away. This includes oil or grease spills. What can I do in the bathroom? Use night lights. Install grab bars by the toilet and in the tub and shower. Do not use towel bars as grab bars. Use non-skid mats or decals in the tub or shower. If you need to sit down in the shower, use a plastic, non-slip stool. Keep the floor dry. Clean up any  water that spills on the floor as soon as it happens. Remove soap buildup in the tub or shower regularly. Attach bath mats securely with double-sided non-slip rug tape. Do not have throw rugs and other things on the floor that can make you trip. What can I do in the bedroom? Use night lights. Make sure that you have a light by your bed that is easy to reach. Do not use any sheets or blankets that are too big for your bed. They should not hang down onto the floor. Have a firm chair that has side arms. You can use this for support while you get dressed. Do not have throw rugs and other things on the floor that can make you trip. What can I do in the kitchen? Clean up any spills right away. Avoid walking on wet floors. Keep items that you use a lot in easy-to-reach places. If you need to reach something above you, use a strong step stool that has a grab bar. Keep electrical cords out of the way. Do not use floor polish or wax that makes floors slippery. If you must use wax, use non-skid floor wax. Do not have throw rugs and other things on the floor that can make you trip. What can I do with my stairs? Do not leave any items on the stairs. Make sure that there are handrails on both sides of the stairs and use them. Fix handrails that are  broken or loose. Make sure that handrails are as long as the stairways. Check any carpeting to make sure that it is firmly attached to the stairs. Fix any carpet that is loose or worn. Avoid having throw rugs at the top or bottom of the stairs. If you do have throw rugs, attach them to the floor with carpet tape. Make sure that you have a light switch at the top of the stairs and the bottom of the stairs. If you do not have them, ask someone to add them for you. What else can I do to help prevent falls? Wear shoes that: Do not have high heels. Have rubber bottoms. Are comfortable and fit you well. Are closed at the toe. Do not wear sandals. If you use a  stepladder: Make sure that it is fully opened. Do not climb a closed stepladder. Make sure that both sides of the stepladder are locked into place. Ask someone to hold it for you, if possible. Clearly mark and make sure that you can see: Any grab bars or handrails. First and last steps. Where the edge of each step is. Use tools that help you move around (mobility aids) if they are needed. These include: Canes. Walkers. Scooters. Crutches. Turn on the lights when you go into a dark area. Replace any light bulbs as soon as they burn out. Set up your furniture so you have a clear path. Avoid moving your furniture around. If any of your floors are uneven, fix them. If there are any pets around you, be aware of where they are. Review your medicines with your doctor. Some medicines can make you feel dizzy. This can increase your chance of falling. Ask your doctor what other things that you can do to help prevent falls. This information is not intended to replace advice given to you by your health care provider. Make sure you discuss any questions you have with your health care provider. Document Released: 08/16/2009 Document Revised: 03/27/2016 Document Reviewed: 11/24/2014 Elsevier Interactive Patient Education  2017 [employer].

## 2021-10-04 ENCOUNTER — Encounter: Payer: Self-pay | Admitting: Family Medicine

## 2021-10-04 ENCOUNTER — Other Ambulatory Visit: Payer: Self-pay

## 2021-10-04 ENCOUNTER — Ambulatory Visit (INDEPENDENT_AMBULATORY_CARE_PROVIDER_SITE_OTHER): Payer: Medicare Other | Admitting: Family Medicine

## 2021-10-04 VITALS — BP 130/78 | HR 74 | Temp 97.8°F | Ht 60.5 in | Wt 158.3 lb

## 2021-10-04 DIAGNOSIS — M85852 Other specified disorders of bone density and structure, left thigh: Secondary | ICD-10-CM | POA: Diagnosis not present

## 2021-10-04 DIAGNOSIS — K8689 Other specified diseases of pancreas: Secondary | ICD-10-CM | POA: Diagnosis not present

## 2021-10-04 DIAGNOSIS — I10 Essential (primary) hypertension: Secondary | ICD-10-CM | POA: Diagnosis not present

## 2021-10-04 DIAGNOSIS — Z86010 Personal history of colonic polyps: Secondary | ICD-10-CM | POA: Diagnosis not present

## 2021-10-04 DIAGNOSIS — I700 Atherosclerosis of aorta: Secondary | ICD-10-CM

## 2021-10-04 DIAGNOSIS — K219 Gastro-esophageal reflux disease without esophagitis: Secondary | ICD-10-CM | POA: Diagnosis not present

## 2021-10-04 DIAGNOSIS — K8591 Acute pancreatitis with uninfected necrosis, unspecified: Secondary | ICD-10-CM

## 2021-10-04 DIAGNOSIS — E1122 Type 2 diabetes mellitus with diabetic chronic kidney disease: Secondary | ICD-10-CM | POA: Diagnosis not present

## 2021-10-04 DIAGNOSIS — Z87440 Personal history of urinary (tract) infections: Secondary | ICD-10-CM

## 2021-10-04 DIAGNOSIS — E1169 Type 2 diabetes mellitus with other specified complication: Secondary | ICD-10-CM | POA: Diagnosis not present

## 2021-10-04 DIAGNOSIS — Z7189 Other specified counseling: Secondary | ICD-10-CM | POA: Diagnosis not present

## 2021-10-04 DIAGNOSIS — E785 Hyperlipidemia, unspecified: Secondary | ICD-10-CM

## 2021-10-04 DIAGNOSIS — K861 Other chronic pancreatitis: Secondary | ICD-10-CM

## 2021-10-04 DIAGNOSIS — E118 Type 2 diabetes mellitus with unspecified complications: Secondary | ICD-10-CM | POA: Diagnosis not present

## 2021-10-04 DIAGNOSIS — Z794 Long term (current) use of insulin: Secondary | ICD-10-CM | POA: Diagnosis not present

## 2021-10-04 DIAGNOSIS — N183 Chronic kidney disease, stage 3 unspecified: Secondary | ICD-10-CM | POA: Diagnosis not present

## 2021-10-04 LAB — LIPID PANEL
Cholesterol: 171 mg/dL (ref 0–200)
HDL: 53.4 mg/dL (ref >39.00)
NonHDL: 117.88
Total CHOL/HDL Ratio: 3
Triglycerides: 321 mg/dL — ABNORMAL HIGH (ref 0.0–149.0)
VLDL: 64.2 mg/dL — ABNORMAL HIGH (ref 0.0–40.0)

## 2021-10-04 LAB — COMPREHENSIVE METABOLIC PANEL
ALT: 19 U/L (ref 0–35)
AST: 15 U/L (ref 0–37)
Albumin: 4.9 g/dL (ref 3.5–5.2)
Alkaline Phosphatase: 99 U/L (ref 39–117)
BUN: 33 mg/dL — ABNORMAL HIGH (ref 6–23)
CO2: 28 mEq/L (ref 19–32)
Calcium: 10.1 mg/dL (ref 8.4–10.5)
Chloride: 102 mEq/L (ref 96–112)
Creatinine, Ser: 1.2 mg/dL (ref 0.40–1.20)
GFR: 43.77 mL/min — ABNORMAL LOW (ref >60.00)
Glucose, Bld: 121 mg/dL — ABNORMAL HIGH (ref 70–99)
Potassium: 4.9 mEq/L (ref 3.5–5.1)
Sodium: 139 mEq/L (ref 135–145)
Total Bilirubin: 0.5 mg/dL (ref 0.2–1.2)
Total Protein: 7.4 g/dL (ref 6.0–8.3)

## 2021-10-04 LAB — CBC WITH DIFFERENTIAL/PLATELET
Basophils Absolute: 0 10*3/uL (ref 0.0–0.1)
Basophils Relative: 0.5 % (ref 0.0–3.0)
Eosinophils Absolute: 0 10*3/uL (ref 0.0–0.7)
Eosinophils Relative: 0 % (ref 0.0–5.0)
HCT: 42.5 % (ref 36.0–46.0)
Hemoglobin: 13.8 g/dL (ref 12.0–15.0)
Lymphocytes Relative: 18.3 % (ref 12.0–46.0)
Lymphs Abs: 1.2 10*3/uL (ref 0.7–4.0)
MCHC: 32.6 g/dL (ref 30.0–36.0)
MCV: 82 fl (ref 78.0–100.0)
Monocytes Absolute: 0.6 10*3/uL (ref 0.1–1.0)
Monocytes Relative: 10 % (ref 3.0–12.0)
Neutro Abs: 4.6 10*3/uL (ref 1.4–7.7)
Neutrophils Relative %: 71.2 % (ref 43.0–77.0)
Platelets: 120 10*3/uL — ABNORMAL LOW (ref 150.0–400.0)
RBC: 5.18 Mil/uL — ABNORMAL HIGH (ref 3.87–5.11)
RDW: 14.7 % (ref 11.5–15.5)
WBC: 6.4 10*3/uL (ref 4.0–10.5)

## 2021-10-04 LAB — LDL CHOLESTEROL, DIRECT: Direct LDL: 77 mg/dL

## 2021-10-04 LAB — VITAMIN D 25 HYDROXY (VIT D DEFICIENCY, FRACTURES): VITD: 25.4 ng/mL — ABNORMAL LOW (ref 30.00–100.00)

## 2021-10-04 MED ORDER — ROSUVASTATIN CALCIUM 10 MG PO TABS: 10.0000 mg | ORAL_TABLET | Freq: Every day | ORAL | 3 refills | Status: DC

## 2021-10-04 MED ORDER — AMLODIPINE BESYLATE 10 MG PO TABS: 10.0000 mg | ORAL_TABLET | Freq: Every day | ORAL | 3 refills | Status: DC

## 2021-10-04 MED ORDER — FOLIC ACID 1 MG PO TABS: 1.0000 mg | ORAL_TABLET | Freq: Every day | ORAL | 3 refills | Status: DC

## 2021-10-04 MED ORDER — PANTOPRAZOLE SODIUM 40 MG PO TBEC: 40.0000 mg | DELAYED_RELEASE_TABLET | Freq: Every evening | ORAL | 3 refills | Status: DC

## 2021-10-04 MED ORDER — METOPROLOL SUCCINATE ER 50 MG PO TB24: 50.0000 mg | ORAL_TABLET | Freq: Every day | ORAL | 3 refills | Status: DC

## 2021-10-04 MED ORDER — FLUTICASONE PROPIONATE 50 MCG/ACT NA SUSP: 1.0000 | Freq: Every day | NASAL | 11 refills | Status: DC

## 2021-10-04 MED ORDER — BENAZEPRIL-HYDROCHLOROTHIAZIDE 20-12.5 MG PO TABS: 0.5000 | ORAL_TABLET | Freq: Every day | ORAL | 3 refills | Status: DC

## 2021-10-04 NOTE — Patient Instructions (Addendum)
Labs today  If interested, check with pharmacy about new 2 shot shingles series (shingrix).  Call [facility] clinic to ask about colonoscopy.  We will refer you to Dr Loanne Drilling endocrinology in [place].  Schedule bone density scan with mammogram next year.  Good to see you today.   Health Maintenance After Age 77 After age 56, you are at a higher risk for certain long-term diseases and infections as well as injuries from falls. Falls are a major cause of broken bones and head injuries in people who are older than age 48. Getting regular preventive care can help to keep you healthy and well. Preventive care includes getting regular testing and making lifestyle changes as recommended by your health care provider. Talk with your health care provider about: Which screenings and tests you should have. A screening is a test that checks for a disease when you have no symptoms. A diet and exercise plan that is right for you. What should I know about screenings and tests to prevent falls? Screening and testing are the best ways to find a health problem early. Early diagnosis and treatment give you the best chance of managing medical conditions that are common after age 94. Certain conditions and lifestyle choices may make you more likely to have a fall. Your health care provider may recommend: Regular vision checks. Poor vision and conditions such as cataracts can make you more likely to have a fall. If you wear glasses, make sure to get your prescription updated if your vision changes. Medicine review. Work with your health care provider to regularly review all of the medicines you are taking, including over-the-counter medicines. Ask your health care provider about any side effects that may make you more likely to have a fall. Tell your health care provider if any medicines that you take make you feel dizzy or sleepy. Strength and balance checks. Your health care provider may recommend certain tests to  check your strength and balance while standing, walking, or changing positions. Foot health exam. Foot pain and numbness, as well as not wearing proper footwear, can make you more likely to have a fall. Screenings, including: Osteoporosis screening. Osteoporosis is a condition that causes the bones to get weaker and break more easily. Blood pressure screening. Blood pressure changes and medicines to control blood pressure can make you feel dizzy. Depression screening. You may be more likely to have a fall if you have a fear of falling, feel depressed, or feel unable to do activities that you used to do. Alcohol use screening. Using too much alcohol can affect your balance and may make you more likely to have a fall. Follow these instructions at home: Lifestyle Do not drink alcohol if: Your health care provider tells you not to drink. If you drink alcohol: Limit how much you have to: 0-1 drink a day for women. 0-2 drinks a day for men. Know how much alcohol is in your drink. In the [place] one drink equals one 12 oz bottle of beer (355 mL), one 5 oz glass of wine (148 mL), or one 1 oz glass of hard liquor (44 mL). Do not use any products that contain nicotine or tobacco. These products include cigarettes, chewing tobacco, and vaping devices, such as e-cigarettes. If you need help quitting, ask your health care provider. Activity  Follow a regular exercise program to stay fit. This will help you maintain your balance. Ask your health care provider what types of exercise are appropriate for you. If  you need a cane or walker, use it as recommended by your health care provider. Wear supportive shoes that have nonskid soles. Safety  Remove any tripping hazards, such as rugs, cords, and clutter. Install safety equipment such as grab bars in bathrooms and safety rails on stairs. Keep rooms and walkways well-lit. General instructions Talk with your health care provider about your risks for falling.  Tell your health care provider if: You fall. Be sure to tell your health care provider about all falls, even ones that seem minor. You feel dizzy, tiredness (fatigue), or off-balance. Take over-the-counter and prescription medicines only as told by your health care provider. These include supplements. Eat a healthy diet and maintain a healthy weight. A healthy diet includes low-fat dairy products, low-fat (lean) meats, and fiber from whole grains, beans, and lots of fruits and vegetables. Stay current with your vaccines. Schedule regular health, dental, and eye exams. Summary Having a healthy lifestyle and getting preventive care can help to protect your health and wellness after age 80. Screening and testing are the best way to find a health problem early and help you avoid having a fall. Early diagnosis and treatment give you the best chance for managing medical conditions that are more common for people who are older than age 4. Falls are a major cause of broken bones and head injuries in people who are older than age 15. Take precautions to prevent a fall at home. Work with your health care provider to learn what changes you can make to improve your health and wellness and to prevent falls. This information is not intended to replace advice given to you by your health care provider. Make sure you discuss any questions you have with your health care provider. Document Revised: 03/11/2021 Document Reviewed: 03/11/2021 Elsevier Patient Education  [place].

## 2021-10-04 NOTE — Assessment & Plan Note (Addendum)
Advanced directives: brings updated directives which will be scanned (2022). Husband [name] is HCPOA then sons Raquel Sarna and Dawanna Grauberger. Grants discretion to [employer] for end of life decisions.

## 2021-10-04 NOTE — Progress Notes (Signed)
Patient ID: Katherine Oliver, female    DOB: [birth year], 75 y.o.   MRN: [id number]  This visit was conducted in person.  BP 130/78   Pulse 74   Temp 97.8 F (36.6 C) (Temporal)   Ht 5' 0.5" (1.537 m)   Wt 158 lb 5 oz (71.8 kg)   SpO2 97%   BMI 30.41 kg/m    CC: AMW f/u visit  Subjective:   HPI: Katherine Oliver is a 75 y.o. female presenting on 10/04/2021 for Annual Exam [facility] prt 2.  Accompanied by husband, Katherine Oliver.)   Saw health advisor yesterday for medicare wellness visit. Note reviewed.    No results found.  Flowsheet Row Clinical Support from 10/03/2021 in [place] at [facility]  PHQ-2 Total Score 0       Fall Risk  10/03/2021 07/09/2021 09/25/2020 05/10/2020 08/23/2019  Falls in the past year? 0 0 1 1 0  Comment - - - - -  Number falls in past yr: 0 0 1 0 0  Injury with Fall? 0 - 0 1 0  Risk for fall due to : No Fall Risks - Medication side effect Impaired balance/gait Medication side effect  Follow up Falls prevention discussed - Falls evaluation completed;Falls prevention discussed Falls prevention discussed Falls evaluation completed;Falls prevention discussed  Comment - - - Much improved -    Prolonged hospitalization for complicated necrotizing pancreatitis with residual chronic diarrhea (pos fecal fat, markedly low fecal elastase, C diff returned positive as well, completed 10d oral vanc 05/2020). Since then on creon 72000u TIDAC through endo. H/o lone afib once during prolonged hospitalization, no trouble since.   DM - sees [facility] endocrinology - but she would like to switch to more local endocrinologist. Son used to see Dr Loanne Drilling - she requests referral to him. Continues using Dexcom D6 continuous glucose meter. Also has CKD. Last A1c 8%. Currently on humalog 10/8/16u TID AC + SSI (2u for every 50 over 200) and lantus 26u nightly. Overnight sugars running >300. Morning sugars 200s.   Recurrent UTI - sees urology, taking d mannitose and  cranberry tablets daily preventatively  Preventative: COLONOSCOPY WITH PROPOFOL 02/05/2018 - 4 TA, SSA, diverticulosis, rpt 3 yrs Katherine Oliver, Katherine Ruddy, MD)  Katherine Oliver Minus Breeding 09/2021 at [facility]  Pap smear - stopped around 75yo, s/p hysterectomy for heavy bleeding. Both ovaries remain. Declines continued pelvic exam.  Dexa Date: 06/2013 [facility] - Tscore -1.1 forearm, normal spine and femur - will repeat this year.  DEXA 09/2019: T -0.3 spine, -1.3 L hip.  Per endo needs repeated - they wanted PCP to order. Discussed completing at wit mammogram next year (order in chart)  Lung cancer screening - not eligible  Flu shot yearly [place] 11/2019, 12/2019. They wonder about autoimmune reaction to [place] leading to pancreas issue. She doesn't want to get booster for this reason.  Pneumovax 05/2012, prevnar-13 2015  Td 2006  Zostavax 2010  Shingrix - discussed - to consider  Advanced directives: brings updated directives which will be scanned (2022). Husband Katherine Oliver is HCPOA then sons Katherine Oliver and Katherine Oliver. Grants discretion to [facility] for end of life decisions.  Seat belt use discussed.  Sunscreen use discussed. No changing moles on skin. 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 Walking 30 min several times a week.  Diet: ok water intake 4 glasses/day, daily fruits/vegetables, red meat 4x/wk, fish 3-4x/wk      Relevant past medical, surgical, family and social history reviewed and updated as indicated. Interim medical history since our last visit reviewed. Allergies and medications reviewed and updated. Outpatient Medications Prior to Visit   Medication Sig Dispense Refill   acetaminophen (TYLENOL) 500 MG tablet Take 500 mg by mouth 2 (two) times daily.     amoxicillin (AMOXIL) 500 MG capsule Take 2,000 mg by mouth See admin instructions. Take 2000 mg 1 hour prior to dental work     B-D UF III MINI PEN NEEDLES 31G X 5 MM MISC Inject into the skin 4 (four) times daily.     Carboxymethylcellul-Glycerin (LUBRICATING EYE DROPS OP) Place 1 drop into both eyes 3 (three) times daily as needed (dry eyes).     Cholecalciferol (VITAMIN D3) 25 MCG (1000 UT) CAPS Take 1 capsule (1,000 Units total) by mouth daily. 30 capsule    conjugated estrogens (PREMARIN) vaginal cream Estrogen Cream Instruction Discard applicator Apply pea sized amount to tip of finger to urethra before bed. Wash hands well after application. Use Monday, Wednesday and Friday 42.5 g 12   Continuous Blood Gluc Receiver (DEXCOM G6 RECEIVER) DEVI Dispense 1 dexcom receiver     Continuous Blood Gluc Sensor (DEXCOM G6 SENSOR) MISC Dispense 1 box Dexcom G6 sensors (3 sensors per box)     Continuous Blood Gluc Transmit (DEXCOM G6 TRANSMITTER) MISC Dispense Dexcom G6 Transmitter     CRANBERRY PO Take 4,200 mg by mouth 2 (two) times daily.     CRANBERRY-VITAMIN C-D MANNOSE PO Take 1,500 mg by mouth daily.     CREON 36000-114000 units CPEP capsule Take 72,000 Units by mouth 3 (three) times daily with meals.     D-MANNOSE PO Take 2 tablets by mouth daily with lunch.     diphenhydrAMINE (BENADRYL) 25 MG tablet Take 25 mg by mouth 2 (two) times daily as needed for allergies.     erythromycin ophthalmic ointment Place 1 application into the left eye at bedtime.     ferrous sulfate 325 (65 FE) MG tablet Take 1 tablet (325 mg total) by mouth daily with breakfast. 90 tablet 3   Glucagon (BAQSIMI ONE PACK) 3 MG/DOSE POWD Place 3 mg into the nose daily as needed (hypoglycemia).     glucose blood (ACCU-CHEK AVIVA PLUS) test strip Use as instructed to check blood sugar 4 times daily. 400 strip 3    insulin lispro (HUMALOG) 100 UNIT/ML injection Take 10u with breakfast, 8u with lunch, 16u with dinner plus sliding scale. If 200-250 add 2 units, 251-300 = add 4 units, >300 = add 6 units     LANTUS SOLOSTAR 100 UNIT/ML Solostar Pen Inject 26 Units into the skin at bedtime.     loperamide (IMODIUM A-D) 2 MG tablet Take 4 mg by mouth 4 (four) times daily as needed for diarrhea or loose stools.     meclizine (ANTIVERT) 25 MG tablet Take 1 tablet (25 mg total) by mouth 3 (three) times daily as needed for nausea. 30 tablet 0   Multiple Vitamin (MULTIVITAMIN WITH MINERALS) TABS tablet Take 1 tablet by mouth daily.     amLODipine (NORVASC) 10 MG tablet TAKE 1 TABLET BY MOUTH  DAILY 90 tablet 3  benazepril-hydrochlorthiazide (LOTENSIN HCT) 20-12.5 MG tablet Take 0.5 tablets by mouth daily. 45 tablet 3   fluticasone (FLONASE) 50 MCG/ACT nasal spray Place 1 spray into both nostrils daily.     folic acid (FOLVITE) 1 MG tablet TAKE 1 TABLET BY MOUTH  DAILY 90 tablet 1   metoprolol succinate (TOPROL-XL) 50 MG 24 hr tablet TAKE 1 TABLET BY MOUTH  DAILY 90 tablet 3   pantoprazole (PROTONIX) 40 MG tablet TAKE 1 TABLET BY MOUTH  DAILY (Patient taking differently: Take 40 mg by mouth every evening.) 90 tablet 1   rosuvastatin (CRESTOR) 10 MG tablet TAKE 1 TABLET BY MOUTH  DAILY 90 tablet 0   LYSINE PO Take 600 mg by mouth daily.     vitamin E 180 MG (400 UNITS) capsule Take 400 Units by mouth daily.     No facility-administered medications prior to visit.     Per HPI unless specifically indicated in ROS section below Review of Systems  Objective:  BP 130/78   Pulse 74   Temp 97.8 F (36.6 C) (Temporal)   Ht 5' 0.5" (1.537 m)   Wt 158 lb 5 oz (71.8 kg)   SpO2 97%   BMI 30.41 kg/m   Wt Readings from Last 3 Encounters:  10/04/21 158 lb 5 oz (71.8 kg)  10/03/21 156 lb (70.8 kg)  07/31/21 150 lb (68 kg)      Physical Exam Vitals and nursing note reviewed.  Constitutional:      Appearance:  Normal appearance. She is not ill-appearing.  HENT:     Head: Normocephalic and atraumatic.     Right Ear: Tympanic membrane, ear canal and external ear normal. There is no impacted cerumen.     Left Ear: Tympanic membrane, ear canal and external ear normal. There is no impacted cerumen.  Eyes:     General:        Right eye: No discharge.        Left eye: No discharge.     Extraocular Movements: Extraocular movements intact.     Conjunctiva/sclera: Conjunctivae normal.     Pupils: Pupils are equal, round, and reactive to light.  Neck:     Thyroid: No thyroid mass or thyromegaly.     Vascular: No carotid bruit.  Cardiovascular:     Rate and Rhythm: Normal rate and regular rhythm.     Pulses: Normal pulses.     Heart sounds: Normal heart sounds. No murmur heard. Pulmonary:     Effort: Pulmonary effort is normal. No respiratory distress.     Breath sounds: Normal breath sounds. No wheezing, rhonchi or rales.  Abdominal:     General: Bowel sounds are normal. There is no distension.     Palpations: Abdomen is soft. There is no mass.     Tenderness: There is no abdominal tenderness. There is no guarding or rebound.     Hernia: No hernia is present.  Musculoskeletal:     Cervical back: Normal range of motion and neck supple. No rigidity.     Right lower leg: No edema.     Left lower leg: No edema.  Lymphadenopathy:     Cervical: No cervical adenopathy.  Skin:    General: Skin is warm and dry.     Findings: No rash.  Neurological:     General: No focal deficit present.     Mental Status: She is alert. Mental status is at baseline.  Psychiatric:        Mood  and Affect: Mood normal.        Behavior: Behavior normal.      Results for orders placed or performed in visit on 10/04/21  Lipid panel  Result Value Ref Range   Cholesterol 171 0 - 200 mg/dL   Triglycerides 321.0 (H) 0.0 - 149.0 mg/dL   HDL 53.40 >39.00 mg/dL   VLDL 64.2 (H) 0.0 - 40.0 mg/dL   Total CHOL/HDL Ratio 3     NonHDL 117.88   Comprehensive metabolic panel  Result Value Ref Range   Sodium 139 135 - 145 mEq/L   Potassium 4.9 3.5 - 5.1 mEq/L   Chloride 102 96 - 112 mEq/L   CO2 28 19 - 32 mEq/L   Glucose, Bld 121 (H) 70 - 99 mg/dL   BUN 33 (H) 6 - 23 mg/dL   Creatinine, Ser 1.20 0.40 - 1.20 mg/dL   Total Bilirubin 0.5 0.2 - 1.2 mg/dL   Alkaline Phosphatase 99 39 - 117 U/L   AST 15 0 - 37 U/L   ALT 19 0 - 35 U/L   Total Protein 7.4 6.0 - 8.3 g/dL   Albumin 4.9 3.5 - 5.2 g/dL   GFR 43.77 (L) >60.00 mL/min   Calcium 10.1 8.4 - 10.5 mg/dL  CBC with Differential/Platelet  Result Value Ref Range   WBC 6.4 4.0 - 10.5 K/uL   RBC 5.18 (H) 3.87 - 5.11 Mil/uL   Hemoglobin 13.8 12.0 - 15.0 g/dL   HCT 42.5 36.0 - 46.0 %   MCV 82.0 78.0 - 100.0 fl   MCHC 32.6 30.0 - 36.0 g/dL   RDW 14.7 11.5 - 15.5 %   Platelets 120.0 (L) 150.0 - 400.0 K/uL   Neutrophils Relative % 71.2 43.0 - 77.0 %   Lymphocytes Relative 18.3 12.0 - 46.0 %   Monocytes Relative 10.0 3.0 - 12.0 %   Eosinophils Relative 0.0 0.0 - 5.0 %   Basophils Relative 0.5 0.0 - 3.0 %   Neutro Abs 4.6 1.4 - 7.7 K/uL   Lymphs Abs 1.2 0.7 - 4.0 K/uL   Monocytes Absolute 0.6 0.1 - 1.0 K/uL   Eosinophils Absolute 0.0 0.0 - 0.7 K/uL   Basophils Absolute 0.0 0.0 - 0.1 K/uL  VITAMIN D 25 Hydroxy (Vit-D Deficiency, Fractures)  Result Value Ref Range   VITD 25.40 (L) 30.00 - 100.00 ng/mL  LDL cholesterol, direct  Result Value Ref Range   Direct LDL 77.0 mg/dL   A1c 8% (07/17/2021) through [place]:  This visit occurred during the SARS-CoV-2 public health emergency.  Safety protocols were in place, including screening questions prior to the visit, additional usage of staff PPE, and extensive cleaning of exam room while observing appropriate contact time as indicated for disinfecting solutions.   Problem List Items Addressed This Visit     Advanced care planning/counseling discussion - Primary (Chronic)    Advanced  directives: brings updated directives which will be scanned (2022). Husband Katherine Oliver is HCPOA then sons Katherine Oliver and Kayleana Waites. Grants discretion to [facility] [employer] for end of life decisions.       Controlled diabetes mellitus type 2 with complications ([place])    Followed by endo on lantus and humalog TIDAC + SSI. Also using Dexcom D6. Will refer to Dr Loanne Drilling as pt desires to establish with local endocrinologist and her son has seen him.       Relevant Medications   benazepril-hydrochlorthiazide (LOTENSIN HCT) 20-12.5 MG tablet   rosuvastatin (CRESTOR) 10  MG tablet   Other Relevant Orders   Ambulatory referral to Endocrinology   Fructosamine   Accelerated hypertension    BP well controlled on current regimen - continue       Relevant Medications   amLODipine (NORVASC) 10 MG tablet   benazepril-hydrochlorthiazide (LOTENSIN HCT) 20-12.5 MG tablet   metoprolol succinate (TOPROL-XL) 50 MG 24 hr tablet   rosuvastatin (CRESTOR) 10 MG tablet   Dyslipidemia associated with type 2 diabetes mellitus (HCC)    Chronic, update FLP on crestor 10mg  daily The 10-year ASCVD risk score (Arnett DK, et al., 2019) is: 44.6%   Values used to calculate the score:     Age: 14 years     Sex: Female     Is Non-Hispanic African American: No     Diabetic: Yes     Tobacco smoker: No     Systolic Blood Pressure: 287 mmHg     Is BP treated: Yes     HDL Cholesterol: 53.4 mg/dL     Total Cholesterol: 171 mg/dL       Relevant Medications   benazepril-hydrochlorthiazide (LOTENSIN HCT) 20-12.5 MG tablet   rosuvastatin (CRESTOR) 10 MG tablet   Other Relevant Orders   Lipid panel (Completed)   Comprehensive metabolic panel (Completed)   History of recurrent UTIs    Sees neurology. Continues cranberry tablets and D-mannitose      GERD (gastroesophageal reflux disease)    Continue pantoprazole 40mg  daily      Relevant Medications   pantoprazole (PROTONIX) 40 MG tablet   Osteopenia    Per endo rec rpt (h/o  protein malnutrition initially after serious illness last year) - will order to be completed with next mammogram 09/2022.  She continues vit D 1000 IU daily.       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 Update renal panel.       Relevant Medications   benazepril-hydrochlorthiazide (LOTENSIN HCT) 20-12.5 MG tablet   rosuvastatin (CRESTOR) 10 MG tablet   Other Relevant Orders   CBC with Differential/Platelet (Completed)   VITAMIN D 25 Hydroxy (Vit-D Deficiency, Fractures) (Completed)   Parathyroid hormone, intact (no Ca)   Hx of adenomatous polyp of colon    Seems overdue for colonoscopy - I have asked her to check with [place] clinic about due date.       Necrotizing pancreatitis    H/o this.       Relevant Medications   pantoprazole (PROTONIX) 40 MG tablet   Chronic pancreatitis (HCC)    Continues Creon through endo.  Will refer to local endocrinologist Dr Loanne Drilling per pt request.       Relevant Medications   pantoprazole (PROTONIX) 40 MG tablet   Other Relevant Orders   Ambulatory referral to Endocrinology   Aortic atherosclerosis ([place])    Continue crestor.       Relevant Medications   amLODipine (NORVASC) 10 MG tablet   benazepril-hydrochlorthiazide (LOTENSIN HCT) 20-12.5 MG tablet   metoprolol succinate (TOPROL-XL) 50 MG 24 hr tablet   rosuvastatin (CRESTOR) 10 MG tablet   Pancreatic insufficiency    Continues Creon through endo.       Relevant Orders   Ambulatory referral to Endocrinology     Meds ordered this encounter  Medications   amLODipine (NORVASC) 10 MG tablet    Sig: Take 1 tablet (10 mg total) by mouth daily.    Dispense:  90 tablet  Refill:  3   benazepril-hydrochlorthiazide (LOTENSIN HCT) 20-12.5 MG tablet    Sig: Take 0.5 tablets by mouth daily.    Dispense:  45 tablet    Refill:  3   fluticasone (FLONASE) 50 MCG/ACT nasal spray    Sig: Place 1 spray into both nostrils daily.     Dispense:  16 g    Refill:  11   folic acid (FOLVITE) 1 MG tablet    Sig: Take 1 tablet (1 mg total) by mouth daily.    Dispense:  90 tablet    Refill:  3   metoprolol succinate (TOPROL-XL) 50 MG 24 hr tablet    Sig: Take 1 tablet (50 mg total) by mouth daily. Take with or immediately following a meal.    Dispense:  90 tablet    Refill:  3   pantoprazole (PROTONIX) 40 MG tablet    Sig: Take 1 tablet (40 mg total) by mouth every evening.    Dispense:  90 tablet    Refill:  3   rosuvastatin (CRESTOR) 10 MG tablet    Sig: Take 1 tablet (10 mg total) by mouth daily.    Dispense:  90 tablet    Refill:  3    Orders Placed This Encounter  Procedures   Lipid panel   Comprehensive metabolic panel   CBC with Differential/Platelet   VITAMIN D 25 Hydroxy (Vit-D Deficiency, Fractures)   Fructosamine   Parathyroid hormone, intact (no Ca)   LDL cholesterol, direct   Ambulatory referral to Endocrinology    Referral Priority:   Routine    Referral Type:   Consultation    Referral Reason:   Specialty Services Required    Number of Visits Requested:   1    Patient instructions: Labs today  If interested, check with pharmacy about new 2 shot shingles series (shingrix).  Call [facility] clinic to ask about colonoscopy.  We will refer you to Dr Loanne Drilling endocrinology in [place].  Schedule bone density scan with mammogram next year.  Good to see you today.  Return in 4 months for follow up visit   Follow up plan: Return in about 4 months (around 02/02/2022) for follow up visit.  [name], MD

## 2021-10-05 NOTE — Assessment & Plan Note (Signed)
Followed by endo on lantus and humalog TIDAC + SSI. Also using Dexcom D6. Will refer to Dr Loanne Drilling as pt desires to establish with local endocrinologist and her son has seen him.

## 2021-10-05 NOTE — Assessment & Plan Note (Signed)
Continues Creon through endo.  Will refer to local endocrinologist Dr Loanne Drilling per pt request.

## 2021-10-05 NOTE — Assessment & Plan Note (Signed)
Continues Creon through endo.

## 2021-10-05 NOTE — Assessment & Plan Note (Signed)
H/o this.  ?

## 2021-10-05 NOTE — Assessment & Plan Note (Signed)
BP well controlled on current regimen - continue

## 2021-10-05 NOTE — Assessment & Plan Note (Signed)
Sees neurology. Continues cranberry tablets and D-mannitose

## 2021-10-05 NOTE — Assessment & Plan Note (Signed)
07/2021: IVC filter removed by IR

## 2021-10-05 NOTE — Assessment & Plan Note (Signed)
Continue pantoprazole 40 mg daily.  ?

## 2021-10-05 NOTE — Assessment & Plan Note (Addendum)
Per endo rec rpt (h/o protein malnutrition initially after serious illness last year) - will order to be completed with next mammogram 09/2022.  She continues vit D 1000 IU daily.

## 2021-10-05 NOTE — Assessment & Plan Note (Signed)
Seems overdue for colonoscopy - I have asked her to check with [place] clinic about due date.

## 2021-10-05 NOTE — Assessment & Plan Note (Signed)
Continue crestor 

## 2021-10-05 NOTE — Assessment & Plan Note (Signed)
Chronic, update FLP on crestor 10mg  daily The 10-year ASCVD risk score (Arnett DK, et al., 2019) is: 44.6%   Values used to calculate the score:     Age: 75 years     Sex: Female     Is Non-Hispanic African American: No     Diabetic: Yes     Tobacco smoker: No     Systolic Blood Pressure: 883 mmHg     Is BP treated: Yes     HDL Cholesterol: 53.4 mg/dL     Total Cholesterol: 171 mg/dL

## 2021-10-05 NOTE — Assessment & Plan Note (Addendum)
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 Update renal panel.

## 2021-10-08 LAB — PARATHYROID HORMONE, INTACT (NO CA): PTH: 52 pg/mL (ref 16–77)

## 2021-10-08 LAB — FRUCTOSAMINE: Fructosamine: 354 umol/L — ABNORMAL HIGH (ref 205–285)

## 2021-10-09 ENCOUNTER — Other Ambulatory Visit: Payer: Self-pay | Admitting: Family Medicine

## 2021-10-09 MED ORDER — VITAMIN D3 25 MCG (1000 UT) PO CAPS: 2.0000 | ORAL_CAPSULE | Freq: Every day | ORAL | Status: AC

## 2021-10-11 ENCOUNTER — Ambulatory Visit: Payer: Medicare Other

## 2021-10-11 DIAGNOSIS — N183 Chronic kidney disease, stage 3 unspecified: Secondary | ICD-10-CM

## 2021-10-11 DIAGNOSIS — I10 Essential (primary) hypertension: Secondary | ICD-10-CM

## 2021-10-11 DIAGNOSIS — E1122 Type 2 diabetes mellitus with diabetic chronic kidney disease: Secondary | ICD-10-CM

## 2021-10-11 DIAGNOSIS — E118 Type 2 diabetes mellitus with unspecified complications: Secondary | ICD-10-CM

## 2021-10-11 NOTE — Chronic Care Management (AMB) (Signed)
Chronic Care Management   CCM RN Visit Note  10/11/2021 Name: Katherine Oliver MRN: [id number] DOB: 28-Mar-1946  Subjective: Katherine Oliver is a 75 y.o. year old female who is a primary care patient of [name], MD. The care management team was consulted for assistance with disease management and care coordination needs.    Engaged with patient by telephone for follow up visit in response to provider referral for case management and/or care coordination services.   Consent to Services:  The patient was given information about Chronic Care Management services, agreed to services, and gave verbal consent prior to initiation of services.  Please see initial visit note for detailed documentation.   Patient agreed to services and verbal consent obtained.   Assessment: Review of patient past medical history, allergies, medications, health status, including review of consultants reports, laboratory and other test data, was performed as part of comprehensive evaluation and provision of chronic care management services.   SDOH (Social Determinants of Health) assessments and interventions performed:    CCM Care Plan  Allergies  Allergen Reactions   Azithromycin Itching    Okay if takes benadryl along with it   Nickel     Reaction to cheap earrings cause redness   Sulfa Antibiotics Itching   Vinegar [Acetic Acid] Nausea Only   Adhesive [Tape] Rash    Paper tape only causes blisters   Keflex [Cephalexin] Rash    Outpatient Encounter Medications as of 10/11/2021  Medication Sig Note   acetaminophen (TYLENOL) 500 MG tablet Take 500 mg by mouth 2 (two) times daily.    amLODipine (NORVASC) 10 MG tablet Take 1 tablet (10 mg total) by mouth daily.    amoxicillin (AMOXIL) 500 MG capsule Take 2,000 mg by mouth See admin instructions. Take 2000 mg 1 hour prior to dental work    benazepril-hydrochlorthiazide (LOTENSIN HCT) 20-12.5 MG tablet Take 0.5 tablets by mouth  daily.    Carboxymethylcellul-Glycerin (LUBRICATING EYE DROPS OP) Place 1 drop into both eyes 3 (three) times daily as needed (dry eyes).    Cholecalciferol (VITAMIN D3) 25 MCG (1000 UT) CAPS Take 2 capsules (2,000 Units total) by mouth daily.    conjugated estrogens (PREMARIN) vaginal cream Estrogen Cream Instruction Discard applicator Apply pea sized amount to tip of finger to urethra before bed. Wash hands well after application. Use Monday, Wednesday and Friday    CRANBERRY PO Take 4,200 mg by mouth 2 (two) times daily.    CREON 36000-114000 units CPEP capsule Take 72,000 Units by mouth 3 (three) times daily with meals.    D-MANNOSE PO Take 2 tablets by mouth daily with lunch.    diphenhydrAMINE (BENADRYL) 25 MG tablet Take 25 mg by mouth 2 (two) times daily as needed for allergies.    ferrous sulfate 325 (65 FE) MG tablet Take 1 tablet (325 mg total) by mouth daily with breakfast.    fluticasone (FLONASE) 50 MCG/ACT nasal spray Place 1 spray into both nostrils daily.    folic acid (FOLVITE) 1 MG tablet Take 1 tablet (1 mg total) by mouth daily.    Glucagon (BAQSIMI ONE PACK) 3 MG/DOSE POWD Place 3 mg into the nose daily as needed (hypoglycemia).    insulin lispro (HUMALOG) 100 UNIT/ML injection Take 10u with breakfast, 8u with lunch, 16u with dinner plus sliding scale. If 200-250 add 2 units, 251-300 = add 4 units, >300 = add 6 units    LANTUS SOLOSTAR 100 UNIT/ML Solostar Pen Inject 26 Units into the  skin at bedtime.    loperamide (IMODIUM A-D) 2 MG tablet Take 4 mg by mouth 4 (four) times daily as needed for diarrhea or loose stools.    meclizine (ANTIVERT) 25 MG tablet Take 1 tablet (25 mg total) by mouth 3 (three) times daily as needed for nausea.    metoprolol succinate (TOPROL-XL) 50 MG 24 hr tablet Take 1 tablet (50 mg total) by mouth daily. Take with or immediately following a meal.    Multiple Vitamin (MULTIVITAMIN WITH MINERALS) TABS tablet Take 1 tablet by mouth daily.     pantoprazole (PROTONIX) 40 MG tablet Take 1 tablet (40 mg total) by mouth every evening.    rosuvastatin (CRESTOR) 10 MG tablet Take 1 tablet (10 mg total) by mouth daily.    B-D UF III MINI PEN NEEDLES 31G X 5 MM MISC Inject into the skin 4 (four) times daily.    Continuous Blood Gluc Receiver (DEXCOM G6 RECEIVER) DEVI Dispense 1 dexcom receiver    Continuous Blood Gluc Sensor (DEXCOM G6 SENSOR) MISC Dispense 1 box Dexcom G6 sensors (3 sensors per box) 07/31/2021: Removed Sunday 07/29/21   Continuous Blood Gluc Transmit (DEXCOM G6 TRANSMITTER) MISC Dispense Dexcom G6 Transmitter    CRANBERRY-VITAMIN C-D MANNOSE PO Take 1,500 mg by mouth daily. (Patient not taking: Reported on 10/11/2021)    erythromycin ophthalmic ointment Place 1 application into the left eye at bedtime. (Patient not taking: Reported on 10/11/2021)    glucose blood (ACCU-CHEK AVIVA PLUS) test strip Use as instructed to check blood sugar 4 times daily.    No facility-administered encounter medications on file as of 10/11/2021.    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[place])    History of pancreatitis 12/13/2019   Chronic diarrhea 06/01/2019   Primary localized osteoarthritis of right knee 01/05/2019   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   Hx of adenomatous polyp of colon 12/15/2017   Vulvar dermatitis 12/07/2017   Right hip pain 08/07/2016   CKD stage 3 due to type 2 diabetes mellitus ([place]) 04/04/2016   Pulmonary nodules  01/03/2016   Advanced care planning/counseling discussion 07/05/2015   Obesity, Class I, BMI 30-34.9 07/05/2015   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   Osteopenia 06/03/2013   Medicare annual wellness visit, subsequent 05/31/2012   Rotator cuff tendonitis, right 03/01/2012   Polycythemia 12/02/2011   Controlled diabetes mellitus type 2 with complications ([place])    Accelerated hypertension    Dyslipidemia associated with type 2 diabetes mellitus ([place])    History of recurrent UTIs    Allergic rhinitis    GERD (gastroesophageal reflux disease)    Rosacea    NAFLD (nonalcoholic fatty liver disease) 06/03/2010    Conditions to be addressed/monitored:HTN and DMII  Care Plan : [facility] plan of care  Updates made by Dannielle Karvonen, RN since 10/11/2021 12:00 AM   Problem: Chronic disease management education and/ or care coordination needs.   Priority: High   Long-Range Goal: Development  of plan of care to address chronic disease management and/ or care coordination needs.   Start Date: 10/11/2021  Expected End Date: 01/31/2022  Priority: High  Current Barriers:  Knowledge Deficits related to plan of care for management of HTN and DMII  Chronic Disease Management support and education needs related to HTN and DMII Patient states she is still having issues with her blood sugars. She states he continues to range in the 200's fasting. Reports today's fasting blood sugar was 247.  Patient states she is concerned that her blood sugars continue to range 200 to 300 during the night after taking her evening lantas. Patient states she has even attempted to not eat past 8 pm and still take lantas to see if this would help blood sugars come down at night but it has not.  Patient states she requested a referral to a local endocrinologist, Dr. Loanne Drilling.  She states he is currently waiting to hear from office for appointment.  Patient states her kidney function is still not the best.   Discussed most recent CMP blood work.  Results for BUN was 33.  Patient denies any issues with her blood pressure at this time. Patient states she had dental work yesterday and forgot to take her pre- antibiotic treatment. She asked if she should take today. She states her dental office is closed today. RNCM advised patient to send MyChart message to primary care provider for advisement. Patient verbalized understanding and agreement.  RNCM Clinical Goal(s):  Patient will verbalize basic understanding of HTN and DMII disease process and self health management plan as evidenced by patient report and/ or notation in chart.  take all medications exactly as prescribed and will call provider for medication related questions as evidenced by patient report and/ or notation in chart.    attend all scheduled medical appointments:   as evidenced by patient report and / or notation in chart.         continue to work with Consulting civil engineer and/or Social Worker to address care management and care coordination needs related to HTN and DMII as evidenced by adherence to CM Team Scheduled appointments     through collaboration with Consulting civil engineer, provider, and care team.   Interventions: 1:1 collaboration with primary care provider regarding development and update of comprehensive plan of care as evidenced by provider attestation and co-signature Inter-disciplinary care team collaboration (see longitudinal plan of care) Evaluation of current treatment plan related to  self management and patient's adherence to plan as established by provider   Diabetes Interventions:  (Status:  Goal on track:  Yes. and Goal on track:  NO.) Long Term Goal Assessed patient's understanding of A1c goal: <7% Reviewed medications with patient and discussed importance of medication adherence Discussed plans with patient for ongoing care management follow up and provided patient with direct contact information for care management  team Reviewed scheduled/upcoming provider appointments including:   Review of patient status, including review of consultants reports, relevant laboratory and other test results, and medications completed Lab Results  Component Value Date   HGBA1C 7 03/26/2021   Hypertension Interventions:  (Status:  Goal on track:  Yes.) Long Term Goal Last practice recorded BP readings:  BP Readings from Last 3 Encounters:  10/04/21 130/78  07/31/21 121/62  07/22/21 (!) 142/86  Most recent eGFR/CrCl: No results found for: EGFR  No components found for: CRCL  Evaluation of current treatment plan related to hypertension self management and patient's adherence to plan  as established by provider Reviewed medications with patient and discussed importance of compliance Discussed plans with patient for ongoing care management follow up and provided patient with direct contact information for care management team Reviewed scheduled/upcoming provider appointments including:    Patient Goals/Self-Care Activities: Take medications as prescribed   Attend all scheduled provider appointments Call pharmacy for medication refills 3-7 days in advance of running out of medications Call provider office for new concerns or questions  Contact provider if you do not hear back regarding visit appointment with new endocrinologist.  Continue to monitor your blood sugars as advised by provider Monitor blood pressure at least 2 - 3 times per month check blood sugar if I feel it is too high or too low Follow a low salt/ diabetic diet Continue to follow Rule of 15 for blood sugar management ( hypoglycemia):      RULE OF 15 How to treat low blood sugars (Blood sugar less than 70 mg/dl  Please follow the RULE OF 15 for the treatment of hypoglycemia treatment (When your blood sugars are less than 70 mg/ dl) STEP  1:  Take 15 grams of carbohydrates when your blood sugar is low, which includes:   3-4 glucose tabs or  3-4 oz of  juice or regular soda or  One tube of glucose gel STEP 2:  Recheck blood sugar in 15 minutes STEP 3:  If your blood sugar is still low at the 15 minute recheck ---then, go back to STEP 1 and treat again with another 15 grams of carbohydrates       Plan:The patient has been provided with contact information for the care management team and has been advised to call with any health related questions or concerns.  The care management team will reach out to the patient again over the next month. Quinn Plowman RN,BSN,CCM RN Case Manager [place]  [phone]

## 2021-10-11 NOTE — Patient Instructions (Signed)
Visit Information  Thank you for taking time to visit with me today. Please don't hesitate to contact me if I can be of assistance to you before our next scheduled telephone appointment.  Patient Goals/Self-Care Activities: Take medications as prescribed   Attend all scheduled provider appointments Call pharmacy for medication refills 3-7 days in advance of running out of medications Call provider office for new concerns or questions  Contact provider if you do not hear back regarding visit appointment with new endocrinologist.  Continue to monitor your blood sugars as advised by provider Monitor blood pressure at least 2 - 3 times per month check blood sugar if I feel it is too high or too low Follow a low salt/ diabetic diet Continue to follow Rule of 15 for blood sugar management ( hypoglycemia):      RULE OF 15 How to treat low blood sugars (Blood sugar less than 70 mg/dl  Please follow the RULE OF 15 for the treatment of hypoglycemia treatment (When your blood sugars are less than 70 mg/ dl) STEP  1:  Take 15 grams of carbohydrates when your blood sugar is low, which includes:   3-4 glucose tabs or  3-4 oz of juice or regular soda or  One tube of glucose gel STEP 2:  Recheck blood sugar in 15 minutes STEP 3:  If your blood sugar is still low at the 15 minute recheck ---then, go back to STEP 1 and treat again with another 15 grams of carbohydrates  Our next appointment is by telephone on December 24, 2021 at 12:30pm   Please call the care guide team at [phone] if you need to cancel or reschedule your appointment.   If you are experiencing a Mental Health or [place] or need someone to talk to, please call the Suicide and Crisis Lifeline: 988 call the [place] National Suicide Prevention Lifeline: (907)429-7340 or TTY: 520-039-4574 TTY 743-262-9619) to talk to a trained counselor call 1-800-273-TALK (toll free, 24 hour hotline)   Patient verbalizes understanding  of instructions provided today and agrees to view in [place].   Quinn Plowman RN,BSN,CCM RN Case Manager [place]  [phone]

## 2021-10-29 ENCOUNTER — Telehealth: Payer: Medicare Other

## 2021-10-31 ENCOUNTER — Other Ambulatory Visit: Payer: Self-pay

## 2021-10-31 NOTE — Telephone Encounter (Signed)
Received refill request from [facility] for Creon. This is listed as historical per patients chart. Will let provider review

## 2021-11-04 MED ORDER — CREON 36000-114000 UNITS PO CPEP: 72000.0000 [IU] | ORAL_CAPSULE | Freq: Three times a day (TID) | ORAL | 0 refills | Status: DC

## 2021-11-04 NOTE — Telephone Encounter (Signed)
Will ERx while she gets in with local endo

## 2021-11-25 ENCOUNTER — Encounter: Payer: Self-pay | Admitting: Endocrinology

## 2021-11-25 ENCOUNTER — Other Ambulatory Visit: Payer: Self-pay | Admitting: Endocrinology

## 2021-11-25 ENCOUNTER — Other Ambulatory Visit: Payer: Self-pay

## 2021-11-25 ENCOUNTER — Ambulatory Visit (INDEPENDENT_AMBULATORY_CARE_PROVIDER_SITE_OTHER): Payer: Medicare Other | Admitting: Endocrinology

## 2021-11-25 VITALS — BP 160/84 | HR 76 | Ht 60.5 in | Wt 162.4 lb

## 2021-11-25 DIAGNOSIS — Z794 Long term (current) use of insulin: Secondary | ICD-10-CM

## 2021-11-25 DIAGNOSIS — E118 Type 2 diabetes mellitus with unspecified complications: Secondary | ICD-10-CM

## 2021-11-25 LAB — POCT GLYCOSYLATED HEMOGLOBIN (HGB A1C): Hemoglobin A1C: 8.3 % — AB (ref 4.0–5.6)

## 2021-11-25 MED ORDER — LANTUS SOLOSTAR 100 UNIT/ML ~~LOC~~ SOPN: 26.0000 [IU] | PEN_INJECTOR | Freq: Every day | SUBCUTANEOUS | 3 refills | Status: DC

## 2021-11-25 MED ORDER — DEXCOM G6 RECEIVER DEVI: 1.0000 | Freq: Once | 1 refills | Status: AC

## 2021-11-25 MED ORDER — DEXCOM G6 SENSOR MISC: 1.0000 | 3 refills | Status: DC

## 2021-11-25 MED ORDER — INSULIN LISPRO (1 UNIT DIAL) 100 UNIT/ML (KWIKPEN): PEN_INJECTOR | SUBCUTANEOUS | 3 refills | Status: DC

## 2021-11-25 MED ORDER — DEXCOM G6 TRANSMITTER MISC: 1.0000 | Freq: Once | 1 refills | Status: AC

## 2021-11-25 MED ORDER — ACCU-CHEK AVIVA PLUS VI STRP: 1.0000 | ORAL_STRIP | Freq: Four times a day (QID) | 3 refills | Status: DC

## 2021-11-25 NOTE — Progress Notes (Signed)
And DKA in Subjective:    Patient ID: Katherine Oliver, female    DOB: [birth year], 76 y.o.   MRN: [id number]  HPI pt is referred by Dr Danise Mina, for diabetes.  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  She had pancreatitis and DKA in 2021.  She has panc exocrine insuff.  She takes lantus, 26 units QHS, and Humalog 3 times a day (just before each meal) 08-13-15 units.  She also takes a total of 10-12 extra units of PRN Humalog per day.  She seldom has hypoglycemia, and these episodes are mild.  She uses dexcom continuous glucose monitor.  I reviewed data.  Glucose varies from 130-370.  It is in general highest at 1AM, and lowest at 4PM.  It decreases 1AM-8AM.  She eats meals at 10AM, 2PM, and 7PM.  She also eats in the late evening.   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 nephropathy ([place])    DSME at [facility] 01/2016    DKA (diabetic ketoacidoses) 04/22/2020   Frequent epistaxis 05/16/2019   S/p cauterization with resolution 2020   GERD (gastroesophageal reflux disease)    Hepatic steatosis    by abd [place] 05/2012, mild transaminitis - normal iron sat and viral hep panel (2011), stable [place] 2017   History of chicken pox    History of measles    History of recurrent UTIs    on chronic keflex   HLD (hyperlipidemia)    HTN (hypertension)    Hypertensive retinopathy of both eyes, 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   [place]   Kidney cyst, acquired 01/2016   L kidney by US   Kidney stone 01/2016   L kidney by [place]   Lung nodules 11/2013   overall stable on f/u CT 01/2016   Osteopenia 06/2013   mild, forearm T -1.1, hip  and spine WNL   Pancreatitis    Polycythemia    mild, stable (2013)   Primary localized osteoarthritis of right knee 01/05/2019   Rosacea    metrogel    Past Surgical History:  Procedure Laterality Date   [place]  12/14/2019   Procedure: BILIARY STENT PLACEMENT;  Surgeon: [name], MD;  Location: [place];  Service: Endoscopy;;   BREAST BIOPSY Right 1963   benign   BREAST BIOPSY Right 08/2014   benign- core   cardiolite stress test  04/2004   normal   CESAREAN SECTION  9390;3009   x2   CHOLECYSTECTOMY  2003   COLONOSCOPY  09/26/2008   adenomatous polyp, rpt 3-5 yrs   COLONOSCOPY  08/2012   adenomatous polyps, diverticulosis, rec rpt 5 yrs [name])   COLONOSCOPY WITH PROPOFOL N/A 02/05/2018   4TA, SSA, diverticulosis, rpt 3 yrs [name], [name], MD)   dexa  2003   normal   dexa  06/2013   [facility] - Tscore -1.1 forearm, normal spine and femur   ERCP N/A 12/14/2019   Procedure: ENDOSCOPIC RETROGRADE CHOLANGIOPANCREATOGRAPHY (ERCP);  Surgeon: [name], MD;  Location: [place];  Service: Endoscopy;  Laterality: N/A;   ERCP  01/2020   nonbleeding gastric ulcer - [facility] hospitalization (Dr Mont Dutton)   ESOPHAGOGASTRODUODENOSCOPY N/A 12/19/2019   Procedure: ESOPHAGOGASTRODUODENOSCOPY (EGD);  Surgeon: [name], MD;  Location[facility] END[facility]COPY;  Service: Endoscopy;  Laterality: N/A;   ESOPHAGOGASTRODUODENOSCOPY  01/2020   pre existing AXIOS cystogastrostomy stent s/p necrosectomy - [facility] hospitalization (Dr Mont Dutton)   ESOPHAGOGASTRODUODENOSCOPY N/A 04/24/2020   Procedure: ESOPHAGOGASTRODUODENOSCOPY (EGD);  Surgeon: Wonda Horner, MD;  Location: [facility] ENDOSCOPY;  Service: Endoscopy;  Laterality: N/A;   ESOPHAGOGASTRODUODENOSCOPY  05/2020   normal esophagus, duodenum, erythematous mucosa in gastric body, gastric cystogastrostomy stent removed ([facility])   IR FLUORO GUIDE CV LINE RIGHT  12/27/2019   IR FLUORO GUIDE CV LINE RIGHT  04/24/2020   IR  IVC FILTER PLMT / S&I /IMG GUID/MOD SED  04/26/2020   IR IVC FILTER RETRIEVAL / S&I /IMG GUID/MOD SED  07/31/2021   IR RADIOLOGIST EVAL & MGMT  07/31/2020   IR RADIOLOGIST EVAL & MGMT  01/15/2021   IR RADIOLOGIST EVAL & MGMT  06/19/2021   IR REMOVAL TUN CV CATH W/O FL  01/03/2020   IR REMOVAL TUN CV CATH W/O FL  04/29/2020   IR REPLC DUODEN/JEJUNO TUBE PERCUT W/FLUORO  04/01/2020   IR US GUIDE VASC ACCESS RIGHT  04/24/2020   SPHINCTEROTOMY  12/14/2019   Procedure: SPHINCTEROTOMY;  Surgeon: [name], MD;  Location: [place];  Service: Endoscopy;;   TOTAL KNEE ARTHROPLASTY Right 01/17/2019   Procedure: TOTAL KNEE ARTHROPLASTY;  Surgeon: [name], MD;  Location: [facility];  Service: Orthopedics;  Laterality: Right;   TRANSTHORACIC ECHOCARDIOGRAM  04/2019   EF 55-60%, modLVH, impaired relaxation    TRIGGER FINGER RELEASE  2007;2010;2011   bilateral   TRIGGER FINGER RELEASE  02/2017   VAGINAL HYSTERECTOMY  1984   for menorrhagia, ovaries in place    Social History   Socioeconomic History   Marital status: Married    Spouse name: Not on file   Number of children: Not on file   Years of education: Not on file   Highest education level: Not on file  Occupational History   Not on file  Tobacco Use   Smoking status: Never   Smokeless tobacco: Never  Vaping Use   Vaping Use: Never used  Substance and Sexual Activity   Alcohol use: Not Currently   Drug use: No   Sexual activity: Yes  Other Topics Concern   Not on file  Social History Narrative   B+ blood type   Caffeine: 2 cups coffee/day   Lives with husband, no pets, grown children ([place] and [place])   Occupation: retired Pharmacist, 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 Daily walking about 20 min.    Diet: ok water intake 4 glasses/day, daily fruits/vegetables, red meat 4x/wk, fish 3-4x/wk   Social Determinants of Health   Financial Resource Strain: Low Risk     Difficulty of Paying Living Expenses: Not hard at all  Food Insecurity: No Food Insecurity   Worried About Charity fundraiser in the Last Year: Never true   Ran Out of Food in the Last Year: Never true  Transportation Needs: No Transportation Needs   Lack of Transportation (Medical): No   Lack of Transportation (Non-Medical): No  Physical Activity: Inactive   Days of Exercise per Week: 0 days   Minutes of Exercise per Session: 0 min  Stress: Not on file  Social Connections: Moderately Isolated   Frequency of Communication with Friends and Family: Once a week   Frequency of Social Gatherings with Friends and Family: Three times a week   Attends Religious Services: Never   Active Member of Clubs or Organizations: No   Attends Archivist Meetings: Never   Marital Status: Married  Human resources officer Violence: Not At Risk   Fear of Current or Ex-Partner: No   Emotionally Abused: No   Physically Abused: No   Sexually Abused: No    Current Outpatient Medications on File Prior to Visit  Medication Sig Dispense Refill   acetaminophen (TYLENOL) 500 MG tablet Take 500 mg by mouth 2 (two) times daily.     amLODipine (NORVASC) 10 MG tablet Take 1 tablet (10 mg total) by mouth daily. 90 tablet 3   amoxicillin (AMOXIL) 500 MG capsule Take 2,000 mg by mouth See admin instructions. Take 2000 mg 1 hour prior to dental work     B-D UF III MINI PEN NEEDLES 31G X 5 MM MISC Inject into the skin 4 (four) times daily.     benazepril-hydrochlorthiazide (LOTENSIN HCT) 20-12.5 MG tablet Take 0.5 tablets by mouth daily. 45 tablet 3   Carboxymethylcellul-Glycerin (LUBRICATING EYE DROPS OP) Place 1 drop into both eyes 3 (three) times daily as needed (dry eyes).     Cholecalciferol (VITAMIN D3) 25 MCG (1000 UT) CAPS Take 2 capsules (2,000 Units total) by mouth daily.     conjugated estrogens (PREMARIN) vaginal cream Estrogen Cream Instruction Discard applicator Apply pea sized amount to tip of finger to  urethra before bed. Wash hands well after application. Use Monday, Wednesday and Friday 42.5 g 12   CRANBERRY PO Take 4,200 mg by mouth 2 (two) times daily.     CRANBERRY-VITAMIN C-D MANNOSE PO Take 1,500 mg by mouth daily.     CREON 36000-114000 units CPEP capsule Take 2 capsules (72,000 Units total) by mouth 3 (three) times daily with meals. 520 capsule 0   D-MANNOSE PO Take 2 tablets by mouth daily with lunch.     diphenhydrAMINE (BENADRYL) 25 MG tablet Take 25 mg by mouth 2 (two) times daily as needed for allergies.     erythromycin ophthalmic ointment Place 1 application into the left eye at bedtime.     ferrous sulfate 325 (65 FE) MG tablet Take 1 tablet (325 mg total) by mouth daily with breakfast. 90 tablet 3   fluticasone (FLONASE) 50 MCG/ACT nasal spray Place 1 spray into both nostrils daily. 16 g 11   folic acid (FOLVITE) 1 MG tablet Take 1 tablet (1 mg total) by mouth daily. 90 tablet 3   Glucagon (BAQSIMI ONE PACK) 3 MG/DOSE POWD Place 3 mg into the nose daily as needed (hypoglycemia).     insulin lispro (HUMALOG) 100 UNIT/ML injection Take 10u with breakfast, 8u with lunch, 16u with dinner plus sliding scale. If 200-250 add 2 units, 251-300 = add 4 units, >300 = add 6 units     loperamide (IMODIUM A-D) 2 MG tablet Take 4 mg by mouth 4 (four) times daily as needed for diarrhea or loose stools.     meclizine (ANTIVERT) 25 MG tablet Take 1 tablet (25 mg total) by mouth 3 (three) times daily as needed for nausea. 30 tablet 0   metoprolol succinate (TOPROL-XL) 50 MG 24 hr tablet Take 1 tablet (50 mg total) by mouth daily. Take with or immediately following a meal. 90 tablet 3   Multiple Vitamin (MULTIVITAMIN  WITH MINERALS) TABS tablet Take 1 tablet by mouth daily.     pantoprazole (PROTONIX) 40 MG tablet Take 1 tablet (40 mg total) by mouth every evening. 90 tablet 3   rosuvastatin (CRESTOR) 10 MG tablet Take 1 tablet (10 mg total) by mouth daily. 90 tablet 3   No current  facility-administered medications on file prior to visit.    Allergies  Allergen Reactions   Azithromycin Itching    Okay if takes benadryl along with it   Nickel     Reaction to cheap earrings cause redness   Sulfa Antibiotics Itching   Vinegar [Acetic Acid] Nausea Only   Adhesive [Tape] Rash    Paper tape only causes blisters   Keflex [Cephalexin] Rash    Family History  Problem Relation Age of Onset   Stroke Mother        several   Hyperlipidemia Mother    Hypertension Mother    Cancer Father        colon   Hypertension Father    Hyperlipidemia Father    Coronary artery disease Father 53       MIx1, CABG   Cancer Paternal Aunt        abdominal   Coronary artery disease Maternal Grandmother    Diabetes Maternal Grandfather    Coronary artery disease Maternal Grandfather    Breast cancer Neg Hx     BP (!) 160/84    Pulse 76    Ht 5' 0.5" (1.537 m)    Wt 162 lb 6.4 oz (73.7 kg)    SpO2 96%    BMI 31.19 kg/m     Review of Systems denies sob, n/v, memory loss.  She has gained 5 lbs x 2 mos.      Objective:   Physical Exam VITAL SIGNS:  See vs page.   GENERAL: no distress.   Pulses: dorsalis pedis intact bilat.   MSK: no deformity of the feet.   CV: no leg edema.   Skin:  no ulcer on the feet.  normal color and temp on the feet.   Neuro: sensation is intact to touch on the feet.     Lab Results  Component Value Date   HGBA1C 8.3 (A) 11/25/2021   Lab Results  Component Value Date   CREATININE 1.20 10/04/2021   BUN 33 (H) 10/04/2021   NA 139 10/04/2021   K 4.9 10/04/2021   CL 102 10/04/2021   CO2 28 10/04/2021   I have reviewed outside records, and summarized: Pt was noted to have elevated A1c, and referred here.  Multiple other health probs were addressed, and also wellness.       Assessment & Plan:  Insulin-requiring type 2 DM: exac by pancreatitis in 2021.   Patient Instructions  good diet and exercise significantly improve the control of your  diabetes.  please let me know if you wish to be referred to a dietician.  high blood sugar is very risky to your health.  you should see an eye doctor and dentist every year.  It is very important to get all recommended vaccinations.  Controlling your blood pressure and cholesterol drastically reduces the damage diabetes does to your body.  Those who smoke should quit.  Please discuss these with your doctor.  Please add 5 units of Humalog in the late evening. Please come back for a follow-up appointment in 2 months.  Your blood pressure is high today.  Please see your primary care provider soon,  to have it rechecked

## 2021-11-25 NOTE — Patient Instructions (Addendum)
good diet and exercise significantly improve the control of your diabetes.  please let me know if you wish to be referred to a dietician.  high blood sugar is very risky to your health.  you should see an eye doctor and dentist every year.  It is very important to get all recommended vaccinations.  Controlling your blood pressure and cholesterol drastically reduces the damage diabetes does to your body.  Those who smoke should quit.  Please discuss these with your doctor.  Please add 5 units of Humalog in the late evening. Please come back for a follow-up appointment in 2 months.  Your blood pressure is high today.  Please see your primary care provider soon, to have it rechecked

## 2021-12-02 ENCOUNTER — Encounter: Payer: Self-pay | Admitting: Endocrinology

## 2021-12-06 IMAGING — DX DG CHEST 1V PORT
1 series · 1 of 1 positions shown · non-contrast
Comparison: 12/16/2019

CLINICAL DATA: Acute respiratory failure.

EXAM:
PORTABLE CHEST 1 VIEW

[chest]
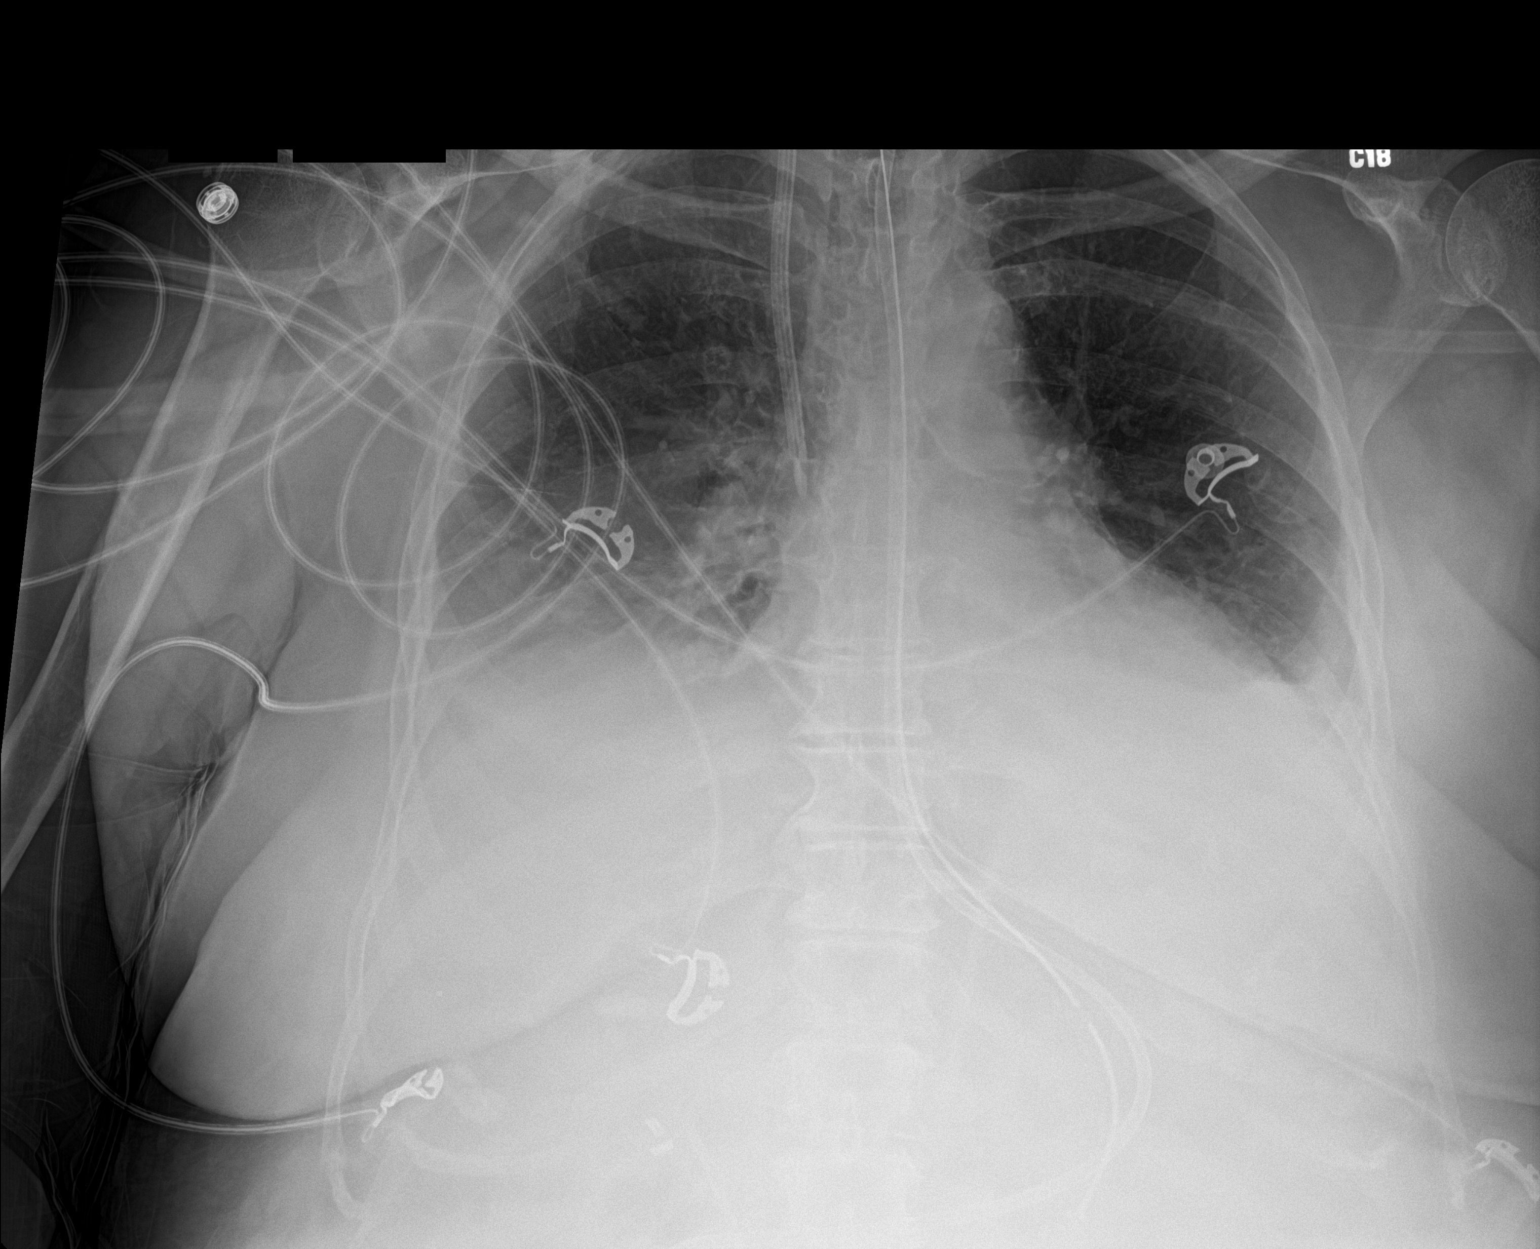

[1 of 1 positions shown; findings below may reference images not displayed]

FINDINGS: Right IJ catheter tip is at the cavoatrial junction. Feeding tube
and NG tube are identified. The tips are both well below the level
of the GE junction. Heart size is normal. Unchanged bilateral
pleural effusions. Mild bibasilar atelectasis.
IMPRESSION: Stable bilateral pleural effusions with bibasilar atelectasis.

## 2021-12-11 ENCOUNTER — Ambulatory Visit: Payer: Self-pay | Admitting: Urology

## 2021-12-11 DIAGNOSIS — H11823 Conjunctivochalasis, bilateral: Secondary | ICD-10-CM | POA: Diagnosis not present

## 2021-12-11 DIAGNOSIS — H02035 Senile entropion of left lower eyelid: Secondary | ICD-10-CM | POA: Diagnosis not present

## 2021-12-11 DIAGNOSIS — Z09 Encounter for follow-up examination after completed treatment for conditions other than malignant neoplasm: Secondary | ICD-10-CM | POA: Diagnosis not present

## 2021-12-11 DIAGNOSIS — E119 Type 2 diabetes mellitus without complications: Secondary | ICD-10-CM | POA: Diagnosis not present

## 2021-12-11 DIAGNOSIS — H02045 Spastic entropion of left lower eyelid: Secondary | ICD-10-CM | POA: Diagnosis not present

## 2021-12-12 ENCOUNTER — Ambulatory Visit (INDEPENDENT_AMBULATORY_CARE_PROVIDER_SITE_OTHER): Payer: Medicare Other | Admitting: Urology

## 2021-12-12 ENCOUNTER — Other Ambulatory Visit: Payer: Self-pay

## 2021-12-12 ENCOUNTER — Encounter: Payer: Self-pay | Admitting: Urology

## 2021-12-12 VITALS — BP 145/74 | HR 80 | Ht 61.0 in | Wt 160.0 lb

## 2021-12-12 DIAGNOSIS — N390 Urinary tract infection, site not specified: Secondary | ICD-10-CM | POA: Diagnosis not present

## 2021-12-12 MED ORDER — ESTRADIOL 0.1 MG/GM VA CREA
TOPICAL_CREAM | VAGINAL | 11 refills | Status: DC
Start: 2021-12-12 — End: 2022-12-18

## 2021-12-12 NOTE — Progress Notes (Signed)
° °  12/12/2021 1:03 PM   Katherine Oliver [birth year] [id number]  Reason for visit: Follow up recurrent UTIs  HPI: 76 year old female with history of recurrent Enterococcus UTIs, who also had a long complex course with necrotizing pancreatitis.  She currently has been managed on a cranberry tablet prophylaxis and topical estrogen cream.  She had 1 E. coli UTI in August 2022 treated with culture appropriate Cipro by her PA [facility], and symptoms resolved.  She also has added d-mannose supplements.  She was previously having some overactive bladder symptoms after the UTI in August 2022, but those have since resolved.  She really denies any urinary complaints today, and is doing well on the combination of topical estrogen cream, cranberry tablets, and d-mannose.  Topical estrogen cream refilled RTC 1 year, if doing well likely can follow with PCP moving forward  B[place]ley [place], [place] [address] [place], [place] [address] [phone]

## 2021-12-24 ENCOUNTER — Ambulatory Visit (INDEPENDENT_AMBULATORY_CARE_PROVIDER_SITE_OTHER): Payer: Medicare Other

## 2021-12-24 DIAGNOSIS — E118 Type 2 diabetes mellitus with unspecified complications: Secondary | ICD-10-CM

## 2021-12-24 DIAGNOSIS — E1122 Type 2 diabetes mellitus with diabetic chronic kidney disease: Secondary | ICD-10-CM

## 2021-12-24 DIAGNOSIS — I10 Essential (primary) hypertension: Secondary | ICD-10-CM

## 2021-12-24 DIAGNOSIS — Z794 Long term (current) use of insulin: Secondary | ICD-10-CM

## 2021-12-24 NOTE — Patient Instructions (Signed)
Visit Information  Thank you for taking time to visit with me today. Please don't hesitate to contact me if I can be of assistance to you before our next scheduled telephone appointment.  Following are the goals we discussed today:  Take medications as prescribed   Attend all scheduled provider appointments Call pharmacy for medication refills 3-7 days in advance of running out of medications Call provider office for new concerns or questions  Continue to monitor your blood sugars daily as advised by provider Monitor blood pressure at least 2 - 3 times per week check blood sugar if I feel it is too high or too low Follow a low salt/ diabetic diet Review education article on high blood sugar ( hyperglycemia) sent you in MyChart.  Continue to follow Rule of 15 for blood sugar management ( hypoglycemia):      RULE OF 15 How to treat low blood sugars (Blood sugar less than 70 mg/dl  Please follow the RULE OF 15 for the treatment of hypoglycemia treatment (When your blood sugars are less than 70 mg/ dl) STEP  1:  Take 15 grams of carbohydrates when your blood sugar is low, which includes:   3-4 glucose tabs or  3-4 oz of juice or regular soda or  One tube of glucose gel STEP 2:  Recheck blood sugar in 15 minutes STEP 3:  If your blood sugar is still low at the 15 minute recheck ---then, go back to STEP 1 and treat again with another 15 grams of carbohydrates  Our next appointment is by telephone on 02/06/22 at 11:00 am  Please call the care guide team at [phone] if you need to cancel or reschedule your appointment.   If you are experiencing a Mental Health or [place] or need someone to talk to, please call the Suicide and Crisis Lifeline: 988 call 1-800-273-TALK (toll free, 24 hour hotline)   Patient verbalizes understanding of instructions and care plan provided today and agrees to view in [place]. Active MyChart status confirmed with patient.   Quinn Plowman  RN,BSN,CCM RN Case Manager [place]  [phone]

## 2021-12-24 NOTE — Chronic Care Management (AMB) (Signed)
Chronic Care Management   CCM RN Visit Note  12/24/2021 Name: Katherine Oliver MRN: [id number] DOB: 07-Dec-1945  Subjective: Katherine Oliver is a 76 y.o. year old female who is a primary care patient of [name], MD. The care management team was consulted for assistance with disease management and care coordination needs.    Engaged with patient by telephone for follow up visit in response to provider referral for case management and/or care coordination services.   Consent to Services:  The patient was given information about Chronic Care Management services, agreed to services, and gave verbal consent prior to initiation of services.  Please see initial visit note for detailed documentation.   Patient agreed to services and verbal consent obtained.   Assessment: Review of patient past medical history, allergies, medications, health status, including review of consultants reports, laboratory and other test data, was performed as part of comprehensive evaluation and provision of chronic care management services.   SDOH (Social Determinants of Health) assessments and interventions performed:    CCM Care Plan  Allergies  Allergen Reactions   Azithromycin Itching    Okay if takes benadryl along with it   Nickel     Reaction to cheap earrings cause redness   Sulfa Antibiotics Itching   Vinegar [Acetic Acid] Nausea Only   Adhesive [Tape] Rash    Paper tape only causes blisters   Keflex [Cephalexin] Rash    Outpatient Encounter Medications as of 12/24/2021  Medication Sig Note   amLODipine (NORVASC) 10 MG tablet Take 1 tablet (10 mg total) by mouth daily.    amoxicillin (AMOXIL) 500 MG capsule Take 2,000 mg by mouth See admin instructions. Take 2000 mg 1 hour prior to dental work    benazepril-hydrochlorthiazide (LOTENSIN HCT) 20-12.5 MG tablet Take 0.5 tablets by mouth daily.    Carboxymethylcellul-Glycerin (LUBRICATING EYE DROPS OP) Place 1 drop into both  eyes 3 (three) times daily as needed (dry eyes).    Cholecalciferol (VITAMIN D3) 25 MCG (1000 UT) CAPS Take 2 capsules (2,000 Units total) by mouth daily.    CRANBERRY PO Take 4,200 mg by mouth 2 (two) times daily.    CRANBERRY-VITAMIN C-D MANNOSE PO Take 1,500 mg by mouth daily.    CREON 36000-114000 units CPEP capsule Take 2 capsules (72,000 Units total) by mouth 3 (three) times daily with meals.    D-MANNOSE PO Take 2 tablets by mouth daily with lunch.    diphenhydrAMINE (BENADRYL) 25 MG tablet Take 25 mg by mouth 2 (two) times daily as needed for allergies.    estradiol (ESTRACE) 0.1 MG/GM vaginal cream Estrogen Cream Instruction Discard applicator Apply pea sized amount to tip of finger to urethra before bed. Wash hands well after application. Use Monday, Wednesday and Friday    ferrous sulfate 325 (65 FE) MG tablet Take 1 tablet (325 mg total) by mouth daily with breakfast.    fluticasone (FLONASE) 50 MCG/ACT nasal spray Place 1 spray into both nostrils daily.    folic acid (FOLVITE) 1 MG tablet Take 1 tablet (1 mg total) by mouth daily.    Glucagon (BAQSIMI ONE PACK) 3 MG/DOSE POWD Place 3 mg into the nose daily as needed (hypoglycemia).    insulin lispro (HUMALOG) 100 UNIT/ML injection Take 10u with breakfast, 8u with lunch, 16u with dinner plus sliding scale. If 200-250 add 2 units, 251-300 = add 4 units, >300 = add 6 units 12/24/2021: Patient states taking differently. Take 10 u  at breakfast and 10  at lunch then 16 u at dinner plus sliding scale add 4 units >300   LANTUS SOLOSTAR 100 UNIT/ML Solostar Pen Inject 26 Units into the skin at bedtime. And pen needles 5/day    loperamide (IMODIUM A-D) 2 MG tablet Take 4 mg by mouth 4 (four) times daily as needed for diarrhea or loose stools.    meclizine (ANTIVERT) 25 MG tablet Take 1 tablet (25 mg total) by mouth 3 (three) times daily as needed for nausea.    metoprolol succinate (TOPROL-XL) 50 MG 24 hr tablet Take 1 tablet (50 mg total) by mouth  daily. Take with or immediately following a meal.    Multiple Vitamin (MULTIVITAMIN WITH MINERALS) TABS tablet Take 1 tablet by mouth daily.    pantoprazole (PROTONIX) 40 MG tablet Take 1 tablet (40 mg total) by mouth every evening.    rosuvastatin (CRESTOR) 10 MG tablet Take 1 tablet (10 mg total) by mouth daily.    acetaminophen (TYLENOL) 500 MG tablet Take 500 mg by mouth 2 (two) times daily.    B-D UF III MINI PEN NEEDLES 31G X 5 MM MISC Inject into the skin 4 (four) times daily.    Continuous Blood Gluc Sensor (DEXCOM G6 SENSOR) MISC 1 Device by Other route See admin instructions. Change every 10 days    erythromycin ophthalmic ointment Place 1 application into the left eye at bedtime. (Patient not taking: Reported on 12/24/2021)    glucose blood (ACCU-CHEK AVIVA PLUS) test strip 1 each by Other route 4 (four) times daily. And lancets 4/day    metFORMIN (GLUCOPHAGE-XR) 500 MG 24 hr tablet Take 500 mg by mouth 2 (two) times daily. (Patient not taking: Reported on 12/24/2021)    No facility-administered encounter medications on file as of 12/24/2021.    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[place])    History of pancreatitis 12/13/2019   Chronic diarrhea 06/01/2019   Primary localized osteoarthritis of right knee 01/05/2019   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   Hx of adenomatous polyp of colon 12/15/2017   Vulvar dermatitis 12/07/2017   Right hip pain 08/07/2016   CKD stage 3 due to  type 2 diabetes mellitus ([place]) 04/04/2016   Pulmonary nodules 01/03/2016   Advanced care planning/counseling discussion 07/05/2015   Obesity, Class I, BMI 30-34.9 07/05/2015   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   Osteopenia 06/03/2013   Medicare annual wellness visit, subsequent 05/31/2012   Rotator cuff tendonitis, right 03/01/2012   Polycythemia 12/02/2011   Controlled diabetes mellitus type 2 with complications ([place])    Accelerated hypertension    Dyslipidemia associated with type 2 diabetes mellitus ([place])    History of recurrent UTIs    Allergic rhinitis    GERD (gastroesophageal reflux disease)    Rosacea    NAFLD (nonalcoholic fatty liver disease) 06/03/2010    Conditions to be addressed/monitored:HTN, DMII, and CKD  Care Plan : [facility] plan of care  Updates made by Dannielle Karvonen,  RN since 12/24/2021 12:00 AM     Problem: Chronic disease management education and/ or care coordination needs.   Priority: High     Long-Range Goal: Development of plan of care to address chronic disease management and/ or care coordination needs.   Start Date: 10/11/2021  Expected End Date: 04/02/2022  Priority: High  Note:   Current Barriers:  Knowledge Deficits related to plan of care for management of HTN and DMII  Chronic Disease Management support and education needs related to HTN and DMII Patient states she is still having issues with her blood sugars. She states he continues to range in the 200's fasting. Reports today's fasting blood sugar was 247.  Patient states she is concerned that her blood sugars continue to range 200 to 300 during the night after taking her evening lantas. Patient states she has even attempted to not eat past 8 pm and still take lantas to see if this would help blood sugars come down at night but it has not.  Patient states she requested a referral to a local endocrinologist, Dr. Loanne Drilling.  She states he is currently waiting to hear from office  for appointment.  Patient states her kidney function is still not the best.  Discussed most recent CMP blood work.  Results for BUN was 33.  Patient denies any issues with her blood pressure at this time. Patient states she had dental work yesterday and forgot to take her pre- antibiotic treatment. She asked if she should take today. She states her dental office is closed today. RNCM advised patient to send MyChart message to primary care provider for advisement. Patient verbalized understanding and agreement.  RNCM Clinical Goal(s):  Patient will verbalize basic understanding of HTN and DMII disease process and self health management plan as evidenced by patient report and/ or notation in chart.  take all medications exactly as prescribed and will call provider for medication related questions as evidenced by patient report and/ or notation in chart.    attend all scheduled medical appointments:   as evidenced by patient report and / or notation in chart.         continue to work with Consulting civil engineer and/or Social Worker to address care management and care coordination needs related to HTN and DMII as evidenced by adherence to CM Team Scheduled appointments     through collaboration with Consulting civil engineer, provider, and care team.   Interventions: 1:1 collaboration with primary care provider regarding development and update of comprehensive plan of care as evidenced by provider attestation and co-signature Inter-disciplinary care team collaboration (see longitudinal plan of care) Evaluation of current treatment plan related to  self management and patient's adherence to plan as established by provider   Diabetes Interventions:  (Status:  Goal on track:  Yes. and Goal on track:  NO.) Long Term Goal Assessed patient's understanding of A1c goal: <7% Reviewed medications with patient and discussed importance of medication adherence Discussed plans with patient for ongoing care management follow up and  provided patient with direct contact information for care management team Reviewed scheduled/upcoming provider appointments  Review of patient status, including review of consultants reports, relevant laboratory and other test results, and medications completed Education article on elevated blood sugar ( hyperglycemia) sent to patient in [place] Lab Results  Component Value Date   HGBA1C 8.3 (A) 11/25/2021    Chronic Kidney Disease Interventions:  (Status:  New goal.) Long Term Goal Evaluation of current treatment plan related to chronic  kidney disease self management and patient's adherence to plan as established by provider      Reviewed medications with patient and discussed importance of compliance    Advised patient, providing education and rationale, to monitor blood pressure daily and record, calling PCP for findings outside established parameters    Reviewed scheduled/upcoming provider appointments Discussed plans with patient for ongoing care management follow up and provided patient with direct contact information for care management team      Hypertension Interventions:  (Status:  Goal on track:  Yes.) Long Term Goal Last practice recorded BP readings:  BP Readings from Last 3 Encounters:  12/12/21 (!) 145/74  11/25/21 (!) 160/84  10/04/21 130/78  Most recent eGFR/CrCl: No results found for: EGFR  No components found for: CRCL  Evaluation of current treatment plan related to hypertension self management and patient's adherence to plan as established by provider Reviewed medications with patient and discussed importance of compliance Discussed plans with patient for ongoing care management follow up and provided patient with direct contact information for care management team Reviewed scheduled/upcoming provider appointments  Advised patient to monitor blood pressure at least 2-3 times per week.  Notify provider of concerns.   Patient Goals/Self-Care Activities: Take  medications as prescribed   Attend all scheduled provider appointments Call pharmacy for medication refills 3-7 days in advance of running out of medications Call provider office for new concerns or questions  Continue to monitor your blood sugars daily as advised by provider Monitor blood pressure at least 2 - 3 times per week check blood sugar if I feel it is too high or too low Follow a low salt/ diabetic diet Review education article on high blood sugar ( hyperglycemia) sent you in MyChart.  Continue to follow Rule of 15 for blood sugar management ( hypoglycemia):      RULE OF 15 How to treat low blood sugars (Blood sugar less than 70 mg/dl  Please follow the RULE OF 15 for the treatment of hypoglycemia treatment (When your blood sugars are less than 70 mg/ dl) STEP  1:  Take 15 grams of carbohydrates when your blood sugar is low, which includes:   3-4 glucose tabs or  3-4 oz of juice or regular soda or  One tube of glucose gel STEP 2:  Recheck blood sugar in 15 minutes STEP 3:  If your blood sugar is still low at the 15 minute recheck ---then, go back to STEP 1 and treat again with another 15 grams of carbohydrates       Plan:The patient has been provided with contact information for the care management team and has been advised to call with any health related questions or concerns.  The care management team will reach out to the patient again over the next 2 months . Quinn Plowman RN,BSN,CCM RN Case Manager [facility]  [phone]

## 2021-12-27 ENCOUNTER — Encounter: Payer: Self-pay | Admitting: Family Medicine

## 2021-12-31 DIAGNOSIS — N183 Chronic kidney disease, stage 3 unspecified: Secondary | ICD-10-CM

## 2021-12-31 DIAGNOSIS — I129 Hypertensive chronic kidney disease with stage 1 through stage 4 chronic kidney disease, or unspecified chronic kidney disease: Secondary | ICD-10-CM

## 2021-12-31 DIAGNOSIS — E1122 Type 2 diabetes mellitus with diabetic chronic kidney disease: Secondary | ICD-10-CM

## 2021-12-31 DIAGNOSIS — Z794 Long term (current) use of insulin: Secondary | ICD-10-CM | POA: Diagnosis not present

## 2022-01-01 ENCOUNTER — Other Ambulatory Visit: Payer: Self-pay | Admitting: Family Medicine

## 2022-01-01 NOTE — Telephone Encounter (Signed)
Refill request Creon  ?See previous mychart message. ?Last office visit 10/04/21 ?Last refill 11/04/21 #[phone]. ?

## 2022-01-23 ENCOUNTER — Encounter: Payer: Self-pay | Admitting: Endocrinology

## 2022-01-23 ENCOUNTER — Other Ambulatory Visit: Payer: Self-pay

## 2022-01-23 ENCOUNTER — Ambulatory Visit (INDEPENDENT_AMBULATORY_CARE_PROVIDER_SITE_OTHER): Payer: Medicare Other | Admitting: Endocrinology

## 2022-01-23 VITALS — BP 126/82 | HR 74 | Ht 61.0 in | Wt 167.0 lb

## 2022-01-23 DIAGNOSIS — E118 Type 2 diabetes mellitus with unspecified complications: Secondary | ICD-10-CM

## 2022-01-23 DIAGNOSIS — Z794 Long term (current) use of insulin: Secondary | ICD-10-CM

## 2022-01-23 LAB — POCT GLYCOSYLATED HEMOGLOBIN (HGB A1C): Hemoglobin A1C: 8 % — AB (ref 4.0–5.6)

## 2022-01-23 MED ORDER — INSULIN LISPRO 100 UNIT/ML ~~LOC~~ SOLN: SUBCUTANEOUS | 3 refills | Status: DC

## 2022-01-23 MED ORDER — LANTUS SOLOSTAR 100 UNIT/ML ~~LOC~~ SOPN: 22.0000 [IU] | PEN_INJECTOR | Freq: Every day | SUBCUTANEOUS | 3 refills | Status: DC

## 2022-01-23 NOTE — Patient Instructions (Addendum)
Please change the insulins to the numbers listed below.   ?Please come back for a follow-up appointment in 3 months.   ?

## 2022-01-23 NOTE — Progress Notes (Signed)
? ?Subjective:  ? ? Patient ID: Katherine Oliver, female    DOB: 06-Nov-1945, 76 y.o.   MRN: [id number] ? ?HPI ?Pt returns for f/u of diabetes mellitus: ?DM type: 1 vs panc insuff ?Dx'ed: 2015 ?Complications: PN PAD, and stage 3 CRI ?Therapy: insulin since 2021 ?GDM: never ?DKA: 2021 ?Severe hypoglycemia: never ?Pancreatitis: 2021 ?Pancreatic imaging: (2021 CT) acute pancreatitis. ?SDOH: none ?Other: She uses dexcom CGM; She eats meals at 10AM, 2PM, and 7PM.  She also eats in the late evening.  ?Interval history: I reviewed continuous glucose monitor data.  Glucose varies from 135-350.  It is in general highest at 9-11PM, and less high at 10AM.  It is lowest at 4AM and 3PM.  It decreases overnight.  She has mild hypoglycemia approx 1/month.  This happens before lunch.   ?Past Medical History:  ?Diagnosis Date  ? Allergic rhinitis   ? Arthritis   ? Breast mass, right 08/2014  ? biopsy benign - [facility]  ? Colon polyp 09/2008  ? tubulovillous adenoma, rpt 3-5 yrs  ? 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 nephropathy ([place])   ? DSME at [facility] 01/2016   ? DKA (diabetic ketoacidoses) 04/22/2020  ? Frequent epistaxis 05/16/2019  ? S/p cauterization with resolution 2020  ? GERD (gastroesophageal reflux disease)   ? Hepatic steatosis   ? by abd [place] 05/2012, mild transaminitis - normal iron sat and viral hep panel (2011), stable [place] 2017  ? History of chicken pox   ? History of measles   ? History of recurrent UTIs   ? on chronic keflex  ? HLD (hyperlipidemia)   ? HTN (hypertension)   ? Hypertensive retinopathy of both eyes, 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  ? [place]  ? Kidney cyst, acquired 01/2016  ? L kidney by [place]  ? Kidney stone 01/2016  ? L kidney by [place]  ? Lung nodules 11/2013  ? overall stable on f/u CT 01/2016  ? Osteopenia 06/2013  ? mild, forearm T -1.1, hip and spine WNL  ? Pancreatitis   ? Polycythemia   ? mild, stable (2013)  ? Primary localized osteoarthritis of right knee 01/05/2019  ? Rosacea   ? metrogel  ? ? ?Past  Surgical History:  ?Procedure Laterality Date  ? APPENDECTOMY  1987  ? BILIARY STENT PLACEMENT  12/14/2019  ? Procedure: BILIARY STENT PLACEMENT;  Surgeon: [name], MD;  Location: [place];  Service: Endoscopy;;  ? BREAST BIOPSY Right 1963  ? benign  ? BREAST BIOPSY Right 08/2014  ? benign- core  ? cardiolite stress test  04/2004  ? normal  ? CESAREAN SECTION  [id number]  ? x2  ? CHOLECYSTECTOMY  2003  ? COLONOSCOPY  09/26/2008  ? adenomatous polyp, rpt 3-5 yrs  ? COLONOSCOPY  08/2012  ? adenomatous polyps, diverticulosis, rec rpt 5 yrs [name])  ? COLONOSCOPY WITH PROPOFOL N/A 02/05/2018  ? 4TA, SSA, diverticulosis, rpt 3 yrs [name], [name], MD)  ? dexa  2003  ? normal  ? dexa  06/2013  ? [facility] - Tscore -1.1 forearm, normal spine and femur  ? ERCP N/A 12/14/2019  ? Procedure: ENDOSCOPIC RETROGRADE CHOLANGIOPANCREATOGRAPHY (ERCP);  Surgeon: [name], MD;  Location: [place];  Service: Endoscopy;  Laterality: N/A;  ? ERCP  01/2020  ? nonbleeding gastric ulcer - [facility] hospitalization (Dr Mont Dutton)  ? ESOPHAGOGASTRODUODENOSCOPY N/A 12/19/2019  ? Procedure: ESOPHAGOGASTRODUODENOSCOPY (EGD);  Surgeon: [name], MD;  Location: [facility] ENDOSCOPY;  Service: Endoscopy;  Laterality: N/A;  ? ESOPHAGOGASTRODUODENOSCOPY  01/2020  ? pre existing AXIOS cystogastrostomy stent s/p necrosectomy - [facility] hospitalization (Dr Mont Dutton)  ? ESOPHAGOGASTRODUODENOSCOPY N/A 04/24/2020  ? Procedure: ESOPHAGOGASTRODUODENOSCOPY (EGD);  Surgeon: Wonda Horner, MD;  Location: [facility] ENDOSCOPY;  Service: Endoscopy;  Laterality: N/A;  ? ESOPHAGOGASTRODUODENOSCOPY  05/2020  ? normal esophagus, duodenum, erythematous mucosa in gastric body, gastric cystogastrostomy stent removed ([facility])  ? IR FLUORO GUIDE CV LINE RIGHT  12/27/2019  ? IR FLUORO GUIDE CV LINE RIGHT  04/24/2020  ? IR IVC FILTER PLMT / S&I /IMG GUID/MOD SED  04/26/2020  ? IR IVC FILTER RETRIEVAL / S&I /IMG GUID/MOD SED  07/31/2021  ? IR RADIOLOGIST EVAL & MGMT  07/31/2020  ? IR  RADIOLOGIST EVAL & MGMT  01/15/2021  ? IR RADIOLOGIST EVAL & MGMT  06/19/2021  ? IR REMOVAL TUN CV CATH W/O FL  01/03/2020  ? IR REMOVAL TUN CV CATH W/O FL  04/29/2020  ? IR REPLC DUODEN/JEJUNO TUBE PERCUT W/FLUORO  04/01/2020  ? IR US GUIDE VASC ACCESS RIGHT  04/24/2020  ? SPHINCTEROTOMY  12/14/2019  ? Procedure: SPHINCTEROTOMY;  Surgeon: [name], MD;  Location: [place];  Service: Endoscopy;;  ? TOTAL KNEE ARTHROPLASTY Right 01/17/2019  ? Procedure: TOTAL KNEE ARTHROPLASTY;  Surgeon: [name], MD;  Location: [facility];  Service: Orthopedics;  Laterality: Right;  ? TRANSTHORACIC ECHOCARDIOGRAM  04/2019  ? EF 55-60%, modLVH, impaired relaxation   ? TRIGGER FINGER RELEASE  2007;2010;2011  ? bilateral  ? TRIGGER FINGER RELEASE  02/2017  ? VAGINAL HYSTERECTOMY  1984  ? for menorrhagia, ovaries in place  ? ? ?Social History  ? ?Socioeconomic History  ? Marital status: Married  ?  Spouse name: Not on file  ? Number of children: Not on file  ? Years of education: Not on file  ? Highest education level: Not on file  ?Occupational History  ? Not on file  ?Tobacco Use  ? Smoking status: Never  ?  Passive exposure: Never  ? Smokeless tobacco: Never  ?Vaping Use  ? Vaping Use: Never used  ?Substance and Sexual Activity  ? Alcohol use: Not Currently  ? Drug use: No  ? Sexual activity: Yes  ?Other Topics Concern  ? Not on file  ?Social History Narrative  ? B+ blood type  ? Caffeine: 2 cups coffee/day  ? Lives with husband, no pets, grown children ([place] and [place])  ? 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 Edu: MS education  ? Activity: [employer], crafts, sewing, house keeping, gardening. Daily walking about 20 min.   ? Diet: ok water intake 4 glasses/day, daily fruits/vegetables, red meat 4x/wk, fish 3-4x/wk  ? ?Social Determinants of Health  ? ?Financial Resource Strain: Low Risk   ? Difficulty of Paying Living Expenses: Not hard at all  ?Food Insecurity: No Food Insecurity  ? Worried About Charity fundraiser in the  Last Year: Never true  ? Ran Out of Food in the Last Year: Never true  ?Transportation Needs: No Transportation Needs  ? Lack of Transportation (Medical): No  ? Lack of Transportation (Non-Medical): No  ?Physical Activity: Inactive  ? Days of Exercise per Week: 0 days  ? Minutes of Exercise per Session: 0 min  ?Stress: Not on file  ?Social Connections: Moderately Isolated  ? Frequency of Communication with Friends and Family: Once a week  ? Frequency of Social Gatherings with Friends and Family: Three times a week  ? Attends Religious Services: Never  ? Active Member of Clubs  or Organizations: No  ? Attends Archivist Meetings: Never  ? Marital Status: Married  ?Intimate Partner Violence: Not At Risk  ? Fear of Current or Ex-Partner: No  ? Emotionally Abused: No  ? Physically Abused: No  ? Sexually Abused: No  ? ? ?Current Outpatient Medications on File Prior to Visit  ?Medication Sig Dispense Refill  ? acetaminophen (TYLENOL) 500 MG tablet Take 500 mg by mouth 2 (two) times daily.    ? amLODipine (NORVASC) 10 MG tablet Take 1 tablet (10 mg total) by mouth daily. 90 tablet 3  ? amoxicillin (AMOXIL) 500 MG capsule Take 2,000 mg by mouth See admin instructions. Take 2000 mg 1 hour prior to dental work    ? B-D UF III MINI PEN NEEDLES 31G X 5 MM MISC Inject into the skin 4 (four) times daily.    ? benazepril-hydrochlorthiazide (LOTENSIN HCT) 20-12.5 MG tablet Take 0.5 tablets by mouth daily. 45 tablet 3  ? Carboxymethylcellul-Glycerin (LUBRICATING EYE DROPS OP) Place 1 drop into both eyes 3 (three) times daily as needed (dry eyes).    ? Cholecalciferol (VITAMIN D3) 25 MCG (1000 UT) CAPS Take 2 capsules (2,000 Units total) by mouth daily.    ? Continuous Blood Gluc Sensor (DEXCOM G6 SENSOR) MISC 1 Device by Other route See admin instructions. Change every 10 days 9 each 3  ? CRANBERRY PO Take 4,200 mg by mouth 2 (two) times daily.    ? CRANBERRY-VITAMIN C-D MANNOSE PO Take 1,500 mg by mouth daily.    ? CREON  36000-114000 units CPEP capsule TAKE 2 CAPSULES BY MOUTH 3 TIMES DAILY WITH MEALS 540 capsule 3  ? D-MANNOSE PO Take 2 tablets by mouth daily with lunch.    ? diphenhydrAMINE (BENADRYL) 25 MG tablet Take 25 mg by

## 2022-02-06 ENCOUNTER — Ambulatory Visit (INDEPENDENT_AMBULATORY_CARE_PROVIDER_SITE_OTHER): Payer: Medicare Other

## 2022-02-06 DIAGNOSIS — E1122 Type 2 diabetes mellitus with diabetic chronic kidney disease: Secondary | ICD-10-CM

## 2022-02-06 DIAGNOSIS — I10 Essential (primary) hypertension: Secondary | ICD-10-CM

## 2022-02-06 DIAGNOSIS — Z794 Long term (current) use of insulin: Secondary | ICD-10-CM

## 2022-02-06 NOTE — Chronic Care Management (AMB) (Signed)
?Chronic Care Management  ? ?CCM RN Visit Note ? ?02/06/2022 ?Name: Katherine Oliver MRN: [id number] DOB: 05-12-1946 ? ?Subjective: ?Katherine Oliver is a 76 y.o. year old female who is a primary care patient of [name], MD. The care management team was consulted for assistance with disease management and care coordination needs.   ? ?Engaged with patient by telephone for follow up visit in response to provider referral for case management and/or care coordination services.  ? ?Consent to Services:  ?The patient was given information about Chronic Care Management services, agreed to services, and gave verbal consent prior to initiation of services.  Please see initial visit note for detailed documentation.  ? ?Patient agreed to services and verbal consent obtained.  ? ?Assessment: Review of patient past medical history, allergies, medications, health status, including review of consultants reports, laboratory and other test data, was performed as part of comprehensive evaluation and provision of chronic care management services.  ? ?SDOH (Social Determinants of Health) assessments and interventions performed:   ? ?CCM Care Plan ? ?Allergies  ?Allergen Reactions  ? Azithromycin Itching  ?  Okay if takes benadryl along with it  ? Nickel   ?  Reaction to cheap earrings cause redness  ? Sulfa Antibiotics Itching  ? Vinegar [Acetic Acid] Nausea Only  ? Adhesive [Tape] Rash  ?  Paper tape only causes blisters  ? Keflex [Cephalexin] Rash  ? ? ?Outpatient Encounter Medications as of 02/06/2022  ?Medication Sig  ? acetaminophen (TYLENOL) 500 MG tablet Take 500 mg by mouth 2 (two) times daily.  ? amLODipine (NORVASC) 10 MG tablet Take 1 tablet (10 mg total) by mouth daily.  ? amoxicillin (AMOXIL) 500 MG capsule Take 2,000 mg by mouth See admin instructions. Take 2000 mg 1 hour prior to dental work  ? B-D UF III MINI PEN NEEDLES 31G X 5 MM MISC Inject into the skin 4 (four) times daily.  ?  benazepril-hydrochlorthiazide (LOTENSIN HCT) 20-12.5 MG tablet Take 0.5 tablets by mouth daily.  ? Carboxymethylcellul-Glycerin (LUBRICATING EYE DROPS OP) Place 1 drop into both eyes 3 (three) times daily as needed (dry eyes).  ? Cholecalciferol (VITAMIN D3) 25 MCG (1000 UT) CAPS Take 2 capsules (2,000 Units total) by mouth daily.  ? Continuous Blood Gluc Sensor (DEXCOM G6 SENSOR) MISC 1 Device by Other route See admin instructions. Change every 10 days  ? CRANBERRY PO Take 4,200 mg by mouth 2 (two) times daily.  ? CRANBERRY-VITAMIN C-D MANNOSE PO Take 1,500 mg by mouth daily.  ? CREON 36000-114000 units CPEP capsule TAKE 2 CAPSULES BY MOUTH 3 TIMES DAILY WITH MEALS  ? D-MANNOSE PO Take 2 tablets by mouth daily with lunch.  ? diphenhydrAMINE (BENADRYL) 25 MG tablet Take 25 mg by mouth 2 (two) times daily as needed for allergies.  ? erythromycin ophthalmic ointment Place 1 application into the left eye at bedtime. (Patient not taking: Reported on 12/24/2021)  ? estradiol (ESTRACE) 0.1 MG/GM vaginal cream Estrogen Cream Instruction Discard applicator Apply pea sized amount to tip of finger to urethra before bed. Wash hands well after application. Use Monday, Wednesday and Friday  ? ferrous sulfate 325 (65 FE) MG tablet Take 1 tablet (325 mg total) by mouth daily with breakfast.  ? fluticasone (FLONASE) 50 MCG/ACT nasal spray Place 1 spray into both nostrils daily.  ? folic acid (FOLVITE) 1 MG tablet Take 1 tablet (1 mg total) by mouth daily.  ? Glucagon (BAQSIMI ONE PACK) 3 MG/DOSE POWD Place  3 mg into the nose daily as needed (hypoglycemia).  ? glucose blood (ACCU-CHEK AVIVA PLUS) test strip 1 each by Other route 4 (four) times daily. And lancets 4/day  ? insulin glargine (LANTUS SOLOSTAR) 100 UNIT/ML Solostar Pen Inject 22 Units into the skin at bedtime. And pen needles 5/day  ? insulin lispro (HUMALOG) 100 UNIT/ML injection 4 times a day (just before each meal), 08-11-19-8 units.  ? loperamide (IMODIUM A-D) 2 MG  tablet Take 4 mg by mouth 4 (four) times daily as needed for diarrhea or loose stools.  ? meclizine (ANTIVERT) 25 MG tablet Take 1 tablet (25 mg total) by mouth 3 (three) times daily as needed for nausea.  ? metFORMIN (GLUCOPHAGE-XR) 500 MG 24 hr tablet Take 500 mg by mouth 2 (two) times daily.  ? metoprolol succinate (TOPROL-XL) 50 MG 24 hr tablet Take 1 tablet (50 mg total) by mouth daily. Take with or immediately following a meal.  ? Multiple Vitamin (MULTIVITAMIN WITH MINERALS) TABS tablet Take 1 tablet by mouth daily.  ? pantoprazole (PROTONIX) 40 MG tablet Take 1 tablet (40 mg total) by mouth every evening.  ? rosuvastatin (CRESTOR) 10 MG tablet Take 1 tablet (10 mg total) by mouth daily.  ? ?No facility-administered encounter medications on file as of 02/06/2022.  ? ? ?Patient Active Problem List  ? Diagnosis Date Noted  ? Pancreatic insufficiency 10/04/2021  ? Entropion of both lower eyelids 04/03/2021  ? Pain involving joint of finger of left hand 10/02/2020  ? Clostridioides difficile infection 06/05/2020  ? Hair loss 06/05/2020  ? Right femoral vein DVT ([place]) 05/03/2020  ? Hx of epistaxis   ? Aortic atherosclerosis ([place])   ? Chronic pancreatitis ([place]) 01/12/2020  ? Acquired renal cyst of left kidney 01/06/2020  ? Gastric stress ulcer 01/06/2020  ? Necrotizing pancreatitis   ? 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 History of pancreatitis 12/13/2019  ? Chronic diarrhea 06/01/2019  ? Primary localized osteoarthritis of right knee 01/05/2019  ? 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 Hx of adenomatous polyp of colon 12/15/2017  ? Vulvar dermatitis 12/07/2017  ? Right hip pain 08/07/2016  ? CKD stage 3 due to type 2 diabetes mellitus ([place]) 04/04/2016  ? Pulmonary nodules 01/03/2016  ? Advanced care planning/counseling discussion 07/05/2015  ? Obesity, Class I, BMI 30-34.9 07/05/2015  ? 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 Osteopenia 06/03/2013  ? Medicare annual wellness visit,  subsequent 05/31/2012  ? Rotator cuff tendonitis, right 03/01/2012  ? Polycythemia 12/02/2011  ? Controlled diabetes mellitus type 2 with complications ([place])   ? Accelerated hypertension   ? Dyslipidemia associated with type 2 diabetes mellitus ([place])   ? History of recurrent UTIs   ? Allergic rhinitis   ? GERD (gastroesophageal reflux disease)   ? Rosacea   ? NAFLD (nonalcoholic fatty liver disease) 06/03/2010  ? ? ?Conditions to be addressed/monitored:HTN and DMII ? ?Care Plan : RNCM plan of care  ?Updates made by Dannielle Karvonen, RN since 02/06/2022 12:00 AM  ?  ? ?Problem: Chronic disease management education and/ or care coordination needs.   ?Priority: High  ?  ? ?Long-Range Goal: Development of plan of care to address chronic disease management and/ or care coordination needs.   ?Start Date: 10/11/2021  ?Expected End Date: 04/02/2022  ?Priority: High  ?Note:   ?Current Barriers:  ?Knowledge Deficits related to plan of care for management of HTN and DMII  ?Chronic Disease Management support and education  needs related to HTN and DMII ?Patient states her blood sugars are starting to trend down.  She states her endocrinology provider adjusted her insulin medications and now her blood sugars are ranging from 89 to 200's.  Patient states she experiences 1-2 hypoglycemic events per week with lowest reading being 89.  Continues to monitor blood sugars with DEXCOM.   Patient states she will be assigned a new endocrinologist due to her current one retiring.  Patient states her triglycerides are elevated. She states she was advised by her doctor to eat more fish.  ?RNCM Clinical Goal(s):  ?Patient will verbalize basic understanding of HTN and DMII disease process and self health management plan as evidenced by patient report and/ or notation in chart.  ?take all medications exactly as prescribed and will call provider for medication related questions as evidenced by patient report and/ or notation in chart.    ?attend all  scheduled medical appointments:   as evidenced by patient report and / or notation in chart.         ?continue to work with Consulting civil engineer and/or Social Worker to address care management and care coordination needs rel

## 2022-02-06 NOTE — Patient Instructions (Signed)
Visit Information ? ?Thank you for taking time to visit with me today. Please don't hesitate to contact me if I can be of assistance to you before our next scheduled telephone appointment. ? ?Following are the goals we discussed today:  ?Continue to take medications as prescribed   ?Attend all scheduled provider appointments ?Call pharmacy for medication refills 3-7 days in advance of running out of medications ?Call provider office for new concerns or questions  ?Continue to monitor your blood sugars daily as advised by provider ?Monitor blood pressure at least 2 - 3 times per week ?check blood sugar if I feel it is too high or too low ?Continue to follow a low salt/ diabetic diet ?Contact endocrinology office regarding newly assigned endocrinology provider ?Continue to follow Rule of 15 for blood sugar management ( hypoglycemia):  ?    RULE OF 15 ?How to treat low blood sugars (Blood sugar less than 70 mg/dl ? Please follow the RULE OF 15 for the treatment of hypoglycemia treatment (When your blood sugars are less than 70 mg/ dl) ?STEP  1:  Take 15 grams of carbohydrates when your blood sugar is low, which includes:  ? 3-4 glucose tabs or ? 3-4 oz of juice or regular soda or ? One tube of glucose gel ?STEP 2:  Recheck blood sugar in 15 minutes ?STEP 3:  If your blood sugar is still low at the 15 minute recheck ---then, go back to STEP 1 and treat again with another 15 grams of carbohydrates ? ?Our next appointment is by telephone on 03/25/22 at 1:00 pm ? ?Please call the care guide team at [phone] if you need to cancel or reschedule your appointment.  ? ?If you are experiencing a Mental Health or [place] or need someone to talk to, please call the Suicide and Crisis Lifeline: 988 ?call 1-800-273-TALK (toll free, 24 hour hotline)  ? ?Patient verbalizes understanding of instructions and care plan provided today and agrees to view in [place]. Active MyChart status confirmed with patient.    ? ?Quinn Plowman RN,BSN,CCM ?RN Case Manager ?[place]  ?[phone] ? ?

## 2022-03-02 DIAGNOSIS — Z794 Long term (current) use of insulin: Secondary | ICD-10-CM | POA: Diagnosis not present

## 2022-03-02 DIAGNOSIS — N183 Chronic kidney disease, stage 3 unspecified: Secondary | ICD-10-CM

## 2022-03-02 DIAGNOSIS — E1122 Type 2 diabetes mellitus with diabetic chronic kidney disease: Secondary | ICD-10-CM

## 2022-03-02 DIAGNOSIS — I10 Essential (primary) hypertension: Secondary | ICD-10-CM | POA: Diagnosis not present

## 2022-03-02 DIAGNOSIS — E118 Type 2 diabetes mellitus with unspecified complications: Secondary | ICD-10-CM | POA: Diagnosis not present

## 2022-03-24 DIAGNOSIS — E119 Type 2 diabetes mellitus without complications: Secondary | ICD-10-CM | POA: Diagnosis not present

## 2022-03-24 DIAGNOSIS — H2513 Age-related nuclear cataract, bilateral: Secondary | ICD-10-CM | POA: Diagnosis not present

## 2022-03-24 DIAGNOSIS — H5213 Myopia, bilateral: Secondary | ICD-10-CM | POA: Diagnosis not present

## 2022-03-24 DIAGNOSIS — H524 Presbyopia: Secondary | ICD-10-CM | POA: Diagnosis not present

## 2022-03-24 LAB — HM DIABETES EYE EXAM

## 2022-03-25 ENCOUNTER — Ambulatory Visit (INDEPENDENT_AMBULATORY_CARE_PROVIDER_SITE_OTHER): Payer: Medicare Other

## 2022-03-25 DIAGNOSIS — N183 Chronic kidney disease, stage 3 unspecified: Secondary | ICD-10-CM

## 2022-03-25 DIAGNOSIS — Z794 Long term (current) use of insulin: Secondary | ICD-10-CM

## 2022-03-25 DIAGNOSIS — I10 Essential (primary) hypertension: Secondary | ICD-10-CM

## 2022-03-25 NOTE — Chronic Care Management (AMB) (Signed)
Chronic Care Management   CCM RN Visit Note  03/25/2022 Name: Katherine Oliver MRN: [id number] DOB: 01-Jun-1946  Subjective: Katherine Oliver is a 76 y.o. year old female who is a primary care patient of [name], MD. The care management team was consulted for assistance with disease management and care coordination needs.    Engaged with patient by telephone for follow up visit in response to provider referral for case management and/or care coordination services.   Consent to Services:  The patient was given information about Chronic Care Management services, agreed to services, and gave verbal consent prior to initiation of services.  Please see initial visit note for detailed documentation.   Patient agreed to services and verbal consent obtained.   Assessment: Review of patient past medical history, allergies, medications, health status, including review of consultants reports, laboratory and other test data, was performed as part of comprehensive evaluation and provision of chronic care management services.   SDOH (Social Determinants of Health) assessments and interventions performed:    CCM Care Plan  Allergies  Allergen Reactions   Azithromycin Itching    Okay if takes benadryl along with it   Nickel     Reaction to cheap earrings cause redness   Sulfa Antibiotics Itching   Vinegar [Acetic Acid] Nausea Only   Adhesive [Tape] Rash    Paper tape only causes blisters   Keflex [Cephalexin] Rash    Outpatient Encounter Medications as of 03/25/2022  Medication Sig   acetaminophen (TYLENOL) 500 MG tablet Take 500 mg by mouth 2 (two) times daily.   amLODipine (NORVASC) 10 MG tablet Take 1 tablet (10 mg total) by mouth daily.   amoxicillin (AMOXIL) 500 MG capsule Take 2,000 mg by mouth See admin instructions. Take 2000 mg 1 hour prior to dental work   B-D UF III MINI PEN NEEDLES 31G X 5 MM MISC Inject into the skin 4 (four) times daily.    benazepril-hydrochlorthiazide (LOTENSIN HCT) 20-12.5 MG tablet Take 0.5 tablets by mouth daily.   Carboxymethylcellul-Glycerin (LUBRICATING EYE DROPS OP) Place 1 drop into both eyes 3 (three) times daily as needed (dry eyes).   Cholecalciferol (VITAMIN D3) 25 MCG (1000 UT) CAPS Take 2 capsules (2,000 Units total) by mouth daily.   Continuous Blood Gluc Sensor (DEXCOM G6 SENSOR) MISC 1 Device by Other route See admin instructions. Change every 10 days   CRANBERRY PO Take 4,200 mg by mouth 2 (two) times daily.   CRANBERRY-VITAMIN C-D MANNOSE PO Take 1,500 mg by mouth daily.   CREON 36000-114000 units CPEP capsule TAKE 2 CAPSULES BY MOUTH 3 TIMES DAILY WITH MEALS   D-MANNOSE PO Take 2 tablets by mouth daily with lunch.   diphenhydrAMINE (BENADRYL) 25 MG tablet Take 25 mg by mouth 2 (two) times daily as needed for allergies.   erythromycin ophthalmic ointment Place 1 application into the left eye at bedtime. (Patient not taking: Reported on 12/24/2021)   estradiol (ESTRACE) 0.1 MG/GM vaginal cream Estrogen Cream Instruction Discard applicator Apply pea sized amount to tip of finger to urethra before bed. Wash hands well after application. Use Monday, Wednesday and Friday   ferrous sulfate 325 (65 FE) MG tablet Take 1 tablet (325 mg total) by mouth daily with breakfast.   fluticasone (FLONASE) 50 MCG/ACT nasal spray Place 1 spray into both nostrils daily.   folic acid (FOLVITE) 1 MG tablet Take 1 tablet (1 mg total) by mouth daily.   Glucagon (BAQSIMI ONE PACK) 3 MG/DOSE POWD Place  3 mg into the nose daily as needed (hypoglycemia).   glucose blood (ACCU-CHEK AVIVA PLUS) test strip 1 each by Other route 4 (four) times daily. And lancets 4/day   insulin glargine (LANTUS SOLOSTAR) 100 UNIT/ML Solostar Pen Inject 22 Units into the skin at bedtime. And pen needles 5/day   insulin lispro (HUMALOG) 100 UNIT/ML injection 4 times a day (just before each meal), 08-11-19-8 units.   loperamide (IMODIUM A-D) 2 MG  tablet Take 4 mg by mouth 4 (four) times daily as needed for diarrhea or loose stools.   meclizine (ANTIVERT) 25 MG tablet Take 1 tablet (25 mg total) by mouth 3 (three) times daily as needed for nausea.   metFORMIN (GLUCOPHAGE-XR) 500 MG 24 hr tablet Take 500 mg by mouth 2 (two) times daily.   metoprolol succinate (TOPROL-XL) 50 MG 24 hr tablet Take 1 tablet (50 mg total) by mouth daily. Take with or immediately following a meal.   Multiple Vitamin (MULTIVITAMIN WITH MINERALS) TABS tablet Take 1 tablet by mouth daily.   pantoprazole (PROTONIX) 40 MG tablet Take 1 tablet (40 mg total) by mouth every evening.   rosuvastatin (CRESTOR) 10 MG tablet Take 1 tablet (10 mg total) by mouth daily.   No facility-administered encounter medications on file as of 03/25/2022.    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[place])    History of pancreatitis 12/13/2019   Chronic diarrhea 06/01/2019   Primary localized osteoarthritis of right knee 01/05/2019   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   Hx of adenomatous polyp of colon 12/15/2017   Vulvar dermatitis 12/07/2017   Right hip pain 08/07/2016   CKD stage 3 due to type 2 diabetes mellitus ([place]) 04/04/2016   Pulmonary nodules 01/03/2016   Advanced care planning/counseling discussion 07/05/2015   Obesity, Class I, BMI 30-34.9 07/05/2015   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   Osteopenia 06/03/2013   Medicare annual wellness visit,  subsequent 05/31/2012   Rotator cuff tendonitis, right 03/01/2012   Polycythemia 12/02/2011   Controlled diabetes mellitus type 2 with complications ([place])    Accelerated hypertension    Dyslipidemia associated with type 2 diabetes mellitus ([place])    History of recurrent UTIs    Allergic rhinitis    GERD (gastroesophageal reflux disease)    Rosacea    NAFLD (nonalcoholic fatty liver disease) 06/03/2010    Conditions to be addressed/monitored:HTN, DMII, and CKD   Care Plan : [facility] plan of care  Updates made by Dannielle Karvonen, RN since 03/25/2022 12:00 AM     Problem: Chronic disease management education and/ or care coordination needs.   Priority: High     Long-Range Goal: Development of plan of care to address chronic disease management and/ or care coordination needs.   Start Date: 10/11/2021  Expected End Date: 05/02/2022  Priority: High  Note:   Current Barriers:  Knowledge Deficits related to plan of care for management of HTN and DMII  Chronic Disease Management support  and education needs related to HTN and DMII Patient states her blood sugars range from 70's to 300's.  She reports having 3 hypoglycemic events over the last 3 days.  Patient reports one of the hypoglycemic events occurred last night.  She reports her fasting blood sugar this morning was 235.  Patient states her fasting blood sugars tend to range in the 200's.  Patient states she continues to eat a low/ modified carbohydrate diet.  Patient states she is on a waiting list with her current endocrinologist practice to see a new doctor since current endocrinologist is leaving.    RNCM Clinical Goal(s):  Patient will verbalize basic understanding of HTN and DMII disease process and self health management plan as evidenced by patient report and/ or notation in chart.  take all medications exactly as prescribed and will call provider for medication related questions as evidenced by patient report and/ or notation in chart.     attend all scheduled medical appointments:   as evidenced by patient report and / or notation in chart.         continue to work with Consulting civil engineer and/or Social Worker to address care management and care coordination needs related to HTN and DMII as evidenced by adherence to CM Team Scheduled appointments     through collaboration with Consulting civil engineer, provider, and care team.   Interventions: 1:1 collaboration with primary care provider regarding development and update of comprehensive plan of care as evidenced by provider attestation and co-signature Inter-disciplinary care team collaboration (see longitudinal plan of care) Evaluation of current treatment plan related to  self management and patient's adherence to plan as established by provider   Diabetes Interventions:  (Status:  Goal on track:  NO.) Long Term Goal Assessed patient's understanding of A1c goal: <7% Reviewed medications with patient and discussed importance of medication adherence Discussed plans with patient for ongoing care management follow up and provided patient with direct contact information for care management team Reviewed scheduled/upcoming provider appointments  Review of patient status, including review of consultants reports, relevant laboratory and other test results, and medications completed Discussed hypoglycemic / hyperglycemic management Advised patient to send a MyChart message to her primary provider informing him of her endocrinologist leaving the practice and asking for assistance with acquiring a new one.   Lab Results  Component Value Date   HGBA1C 8.0 (A) 01/23/2022    Chronic Kidney Disease Interventions:  (Status:  Goal on track:  Yes.) Long Term Goal Evaluation of current treatment plan related to chronic kidney disease self management and patient's adherence to plan as established by provider      Reviewed medications with patient and discussed importance of compliance    Advised  patient, providing education and rationale, to monitor blood pressure daily and record, calling PCP for findings outside established parameters    Reviewed scheduled/upcoming provider appointments Discussed plans with patient for ongoing care management follow up and provided patient with direct contact information for care management team     Hypertension Interventions:  (Status:  Goal on track:  Yes.) Long Term Goal Last practice recorded BP readings:  BP Readings from Last 3 Encounters:  01/23/22 126/82  12/12/21 (!) 145/74  11/25/21 (!) 160/84  Most recent eGFR/CrCl: No results found for: EGFR  No components found for: CRCL  Evaluation of current treatment plan related to hypertension self management and patient's adherence to plan as established by provider Reviewed medications with patient and discussed importance of compliance  Discussed plans with patient for ongoing care management follow up and provided patient with direct contact information for care management team Reviewed scheduled/upcoming provider appointments  Advised patient to monitor blood pressure at least 2-3 times per week.  Notify provider of concerns. Advised to follow a low salt diet.    Patient Goals/Self-Care Activities: Continue to take medications as prescribed   Attend all scheduled provider appointments Call pharmacy for medication refills 3-7 days in advance of running out of medications Call provider office for new concerns or questions  Continue to monitor your blood sugars daily as advised by provider Monitor blood pressure at least 2 - 3 times per week check blood sugar if I feel it is too high or too low ( treat accordingly) Continue to follow a low salt/ diabetic diet Send your primary care provider a MyChart message regarding need for new endocrinologist.  Continue to follow Rule of 15 for blood sugar management ( hypoglycemia):      RULE OF 15 How to treat low blood sugars (Blood sugar less than  70 mg/dl  Please follow the RULE OF 15 for the treatment of hypoglycemia treatment (When your blood sugars are less than 70 mg/ dl) STEP  1:  Take 15 grams of carbohydrates when your blood sugar is low, which includes:   3-4 glucose tabs or  3-4 oz of juice or regular soda or  One tube of glucose gel STEP 2:  Recheck blood sugar in 15 minutes STEP 3:  If your blood sugar is still low at the 15 minute recheck ---then, go back to STEP 1 and treat again with another 15 grams of carbohydrates       Plan:The patient has been provided with contact information for the care management team and has been advised to call with any health related questions or concerns.  The care management team will reach out to the patient again over the next 45 days. Quinn Plowman RN,BSN,CCM RN Case Manager [place]  [phone]

## 2022-03-25 NOTE — Patient Instructions (Signed)
Visit Information  Thank you for taking time to visit with me today. Please don't hesitate to contact me if I can be of assistance to you before our next scheduled telephone appointment.  Following are the goals we discussed today:  Continue to take medications as prescribed   Attend all scheduled provider appointments Call pharmacy for medication refills 3-7 days in advance of running out of medications Call provider office for new concerns or questions  Continue to monitor your blood sugars daily as advised by provider Monitor blood pressure at least 2 - 3 times per week check blood sugar if I feel it is too high or too low ( treat accordingly) Continue to follow a low salt/ diabetic diet Send your primary care provider a MyChart message regarding need for new endocrinologist.  Continue to follow Rule of 15 for blood sugar management ( hypoglycemia):      RULE OF 15 How to treat low blood sugars (Blood sugar less than 70 mg/dl  Please follow the RULE OF 15 for the treatment of hypoglycemia treatment (When your blood sugars are less than 70 mg/ dl) STEP  1:  Take 15 grams of carbohydrates when your blood sugar is low, which includes:   3-4 glucose tabs or  3-4 oz of juice or regular soda or  One tube of glucose gel STEP 2:  Recheck blood sugar in 15 minutes STEP 3:  If your blood sugar is still low at the 15 minute recheck ---then, go back to STEP 1 and treat again with another 15 grams of carbohydrates  Our next appointment is by telephone on 04/28/22 at 1:00 pm  Please call the care guide team at [phone] if you need to cancel or reschedule your appointment.   If you are experiencing a Mental Health or [place] or need someone to talk to, please call the Suicide and Crisis Lifeline: 988 call 1-800-273-TALK (toll free, 24 hour hotline)   Patient verbalizes understanding of instructions and care plan provided today and agrees to view in [place]. Active MyChart  status and patient understanding of how to access instructions and care plan via MyChart confirmed with patient.     Quinn Plowman RN,BSN,CCM RN Case Manager [place]  [phone]

## 2022-03-26 ENCOUNTER — Encounter: Payer: Self-pay | Admitting: Family Medicine

## 2022-04-02 DIAGNOSIS — N183 Chronic kidney disease, stage 3 unspecified: Secondary | ICD-10-CM

## 2022-04-02 DIAGNOSIS — E1122 Type 2 diabetes mellitus with diabetic chronic kidney disease: Secondary | ICD-10-CM

## 2022-04-02 DIAGNOSIS — Z794 Long term (current) use of insulin: Secondary | ICD-10-CM

## 2022-04-02 DIAGNOSIS — I129 Hypertensive chronic kidney disease with stage 1 through stage 4 chronic kidney disease, or unspecified chronic kidney disease: Secondary | ICD-10-CM | POA: Diagnosis not present

## 2022-04-20 ENCOUNTER — Encounter: Payer: Self-pay | Admitting: Family Medicine

## 2022-04-20 DIAGNOSIS — Z8719 Personal history of other diseases of the digestive system: Secondary | ICD-10-CM

## 2022-04-20 DIAGNOSIS — K861 Other chronic pancreatitis: Secondary | ICD-10-CM

## 2022-04-20 DIAGNOSIS — E118 Type 2 diabetes mellitus with unspecified complications: Secondary | ICD-10-CM

## 2022-04-20 DIAGNOSIS — K8689 Other specified diseases of pancreas: Secondary | ICD-10-CM

## 2022-04-28 ENCOUNTER — Ambulatory Visit (INDEPENDENT_AMBULATORY_CARE_PROVIDER_SITE_OTHER): Payer: Medicare Other

## 2022-04-28 DIAGNOSIS — I10 Essential (primary) hypertension: Secondary | ICD-10-CM

## 2022-04-28 DIAGNOSIS — N183 Chronic kidney disease, stage 3 unspecified: Secondary | ICD-10-CM

## 2022-04-28 DIAGNOSIS — Z794 Long term (current) use of insulin: Secondary | ICD-10-CM

## 2022-05-02 DIAGNOSIS — E1122 Type 2 diabetes mellitus with diabetic chronic kidney disease: Secondary | ICD-10-CM | POA: Diagnosis not present

## 2022-05-02 DIAGNOSIS — N183 Chronic kidney disease, stage 3 unspecified: Secondary | ICD-10-CM | POA: Diagnosis not present

## 2022-05-02 DIAGNOSIS — I129 Hypertensive chronic kidney disease with stage 1 through stage 4 chronic kidney disease, or unspecified chronic kidney disease: Secondary | ICD-10-CM | POA: Diagnosis not present

## 2022-05-02 DIAGNOSIS — Z794 Long term (current) use of insulin: Secondary | ICD-10-CM

## 2022-05-12 ENCOUNTER — Other Ambulatory Visit: Payer: Self-pay | Admitting: Family Medicine

## 2022-05-12 DIAGNOSIS — K8689 Other specified diseases of pancreas: Secondary | ICD-10-CM

## 2022-05-14 NOTE — Telephone Encounter (Signed)
Patient called back stating that [facility] has the Creon and she would like it sent to the pharmacy.

## 2022-05-14 NOTE — Telephone Encounter (Signed)
Spoke to patient by telephone. Patient stated that she was getting this from her mail order pharmacy. Patient stated that she talked with [facility] and they do not have Creon in stock and does not know when they will get more in stock. Patient stated that she was told by [facility] to have it filled at a local pharmacy. [facility] sure that [facility] Creon in stock. Patient stated that she will call back and let Dr. Danise Mina know what pharmacy to send the medication to. Last refill 01/02/22 #540/3 [facility] Last office visit 10/04/21

## 2022-05-14 NOTE — Telephone Encounter (Signed)
ERx 

## 2022-06-10 ENCOUNTER — Ambulatory Visit: Payer: Medicare Other

## 2022-06-10 NOTE — Patient Instructions (Signed)
Visit Information  Thank you for taking time to visit with me today. Please don't hesitate to contact me if I can be of assistance to you before our next scheduled telephone appointment.  Following are the goals we discussed today:  Continue to take medications as prescribed   Attend all scheduled provider appointments Call pharmacy for medication refills 3-7 days in advance of running out of medications Call provider office for new concerns or questions  Continue to monitor your blood sugars daily as advised by provider Continue to monitor blood pressure at least 2 - 3 times per week check blood sugar if I feel it is too high or too low ( treat accordingly) Continue to follow a low salt/ diabetic diet Continue to follow Rule of 15 for blood sugar management ( hypoglycemia):  Notify your primary care provider of recent hypoglycemic events and dose adjustments made to your Creon medication     RULE OF 15 How to treat low blood sugars (Blood sugar less than 70 mg/dl  Please follow the RULE OF 15 for the treatment of hypoglycemia treatment (When your blood sugars are less than 70 mg/ dl) STEP  1:  Take 15 grams of carbohydrates when your blood sugar is low, which includes:   3-4 glucose tabs or  3-4 oz of juice or regular soda or  One tube of glucose gel STEP 2:  Recheck blood sugar in 15 minutes STEP 3:  If your blood sugar is still low at the 15 minute recheck ---then, go back to STEP 1 and treat again with another 15 grams of carbohydrates  Our next appointment is by telephone on 08/26/22 at 1:30 pm  Please call the care guide team at [phone] if you need to cancel or reschedule your appointment.   If you are experiencing a Mental Health or [place] or need someone to talk to, please call the Suicide and Crisis Lifeline: 988 call 1-800-273-TALK (toll free, 24 hour hotline)   Patient verbalizes understanding of instructions and care plan provided today and agrees to  view in [place]. Active MyChart status and patient understanding of how to access instructions and care plan via MyChart confirmed with patient.      Quinn Plowman RN,BSN,CCM RN Care Manager Coordinator [place] [phone]

## 2022-06-10 NOTE — Chronic Care Management (AMB) (Signed)
Care Management    RN Visit Note  06/10/2022 Name: Katherine Oliver MRN: [id number] DOB: 10-11-1946  Subjective: Katherine Oliver is a 76 y.o. year old female who is a primary care patient of [name], MD. The care management team was consulted for assistance with disease management and care coordination needs.    Engaged with patient by telephone for follow up visit in response to provider referral for case management and/or care coordination services.   Consent to Services:   Ms. Cordell was given information about Care Management services today including:  Care Management services includes personalized support from designated clinical staff supervised by her physician, including individualized plan of care and coordination with other care providers 24/7 contact phone numbers for assistance for urgent and routine care needs. The patient may stop case management services at any time by phone call to the office staff.  Patient agreed to services and consent obtained.   Assessment: Review of patient past medical history, allergies, medications, health status, including review of consultants reports, laboratory and other test data, was performed as part of comprehensive evaluation and provision of chronic care management services.   SDOH (Social Determinants of Health) assessments and interventions performed:    Care Plan  Allergies  Allergen Reactions   Azithromycin Itching    Okay if takes benadryl along with it   Nickel     Reaction to cheap earrings cause redness   Sulfa Antibiotics Itching   Vinegar [Acetic Acid] Nausea Only   Adhesive [Tape] Rash    Paper tape only causes blisters   Keflex [Cephalexin] Rash    Outpatient Encounter Medications as of 06/10/2022  Medication Sig   fluticasone (FLONASE) 50 MCG/ACT nasal spray Place 1 spray into both nostrils daily.   folic acid (FOLVITE) 1 MG tablet Take 1 tablet (1 mg total) by mouth daily.    Glucagon (BAQSIMI ONE PACK) 3 MG/DOSE POWD Place 3 mg into the nose daily as needed (hypoglycemia).   insulin glargine (LANTUS SOLOSTAR) 100 UNIT/ML Solostar Pen Inject 22 Units into the skin at bedtime. And pen needles 5/day   insulin lispro (HUMALOG) 100 UNIT/ML injection 4 times a day (just before each meal), 08-11-19-8 units.   loperamide (IMODIUM A-D) 2 MG tablet Take 4 mg by mouth 4 (four) times daily as needed for diarrhea or loose stools.   meclizine (ANTIVERT) 25 MG tablet Take 1 tablet (25 mg total) by mouth 3 (three) times daily as needed for nausea.   metoprolol succinate (TOPROL-XL) 50 MG 24 hr tablet Take 1 tablet (50 mg total) by mouth daily. Take with or immediately following a meal.   Multiple Vitamin (MULTIVITAMIN WITH MINERALS) TABS tablet Take 1 tablet by mouth daily.   pantoprazole (PROTONIX) 40 MG tablet Take 1 tablet (40 mg total) by mouth every evening.   rosuvastatin (CRESTOR) 10 MG tablet Take 1 tablet (10 mg total) by mouth daily.   acetaminophen (TYLENOL) 500 MG tablet Take 500 mg by mouth 2 (two) times daily.   amLODipine (NORVASC) 10 MG tablet Take 1 tablet (10 mg total) by mouth daily.   amoxicillin (AMOXIL) 500 MG capsule Take 2,000 mg by mouth See admin instructions. Take 2000 mg 1 hour prior to dental work   B-D UF III MINI PEN NEEDLES 31G X 5 MM MISC Inject into the skin 4 (four) times daily.   benazepril-hydrochlorthiazide (LOTENSIN HCT) 20-12.5 MG tablet Take 0.5 tablets by mouth daily.   Carboxymethylcellul-Glycerin (LUBRICATING EYE DROPS OP) Place  1 drop into both eyes 3 (three) times daily as needed (dry eyes).   Cholecalciferol (VITAMIN D3) 25 MCG (1000 UT) CAPS Take 2 capsules (2,000 Units total) by mouth daily.   Continuous Blood Gluc Sensor (DEXCOM G6 SENSOR) MISC 1 Device by Other route See admin instructions. Change every 10 days   CRANBERRY PO Take 4,200 mg by mouth 2 (two) times daily.   CRANBERRY-VITAMIN C-D MANNOSE PO Take 1,500 mg by mouth daily.    CREON 36000-114000 units CPEP capsule TAKE 2 CAPSULES BY MOUTH AS DIRECTED (ONE CAPSULE WITH SNACKS, TWO CAPSULES WITH MEALS)   D-MANNOSE PO Take 2 tablets by mouth daily with lunch.   diphenhydrAMINE (BENADRYL) 25 MG tablet Take 25 mg by mouth 2 (two) times daily as needed for allergies.   erythromycin ophthalmic ointment Place 1 application into the left eye at bedtime. (Patient not taking: Reported on 12/24/2021)   estradiol (ESTRACE) 0.1 MG/GM vaginal cream Estrogen Cream Instruction Discard applicator Apply pea sized amount to tip of finger to urethra before bed. Wash hands well after application. Use Monday, Wednesday and Friday   ferrous sulfate 325 (65 FE) MG tablet Take 1 tablet (325 mg total) by mouth daily with breakfast.   glucose blood (ACCU-CHEK AVIVA PLUS) test strip 1 each by Other route 4 (four) times daily. And lancets 4/day   metFORMIN (GLUCOPHAGE-XR) 500 MG 24 hr tablet Take 500 mg by mouth 2 (two) times daily. (Patient not taking: Reported on 06/10/2022)   No facility-administered encounter medications on file as of 06/10/2022.    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[place])    History of pancreatitis 12/13/2019   Chronic diarrhea 06/01/2019   Primary localized osteoarthritis of right knee 01/05/2019   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   Hx of adenomatous polyp of colon 12/15/2017   Vulvar dermatitis 12/07/2017   Right hip pain 08/07/2016   CKD stage 3 due to type 2  diabetes mellitus ([place]) 04/04/2016   Pulmonary nodules 01/03/2016   Advanced care planning/counseling discussion 07/05/2015   Obesity, Class I, BMI 30-34.9 07/05/2015   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   Osteopenia 06/03/2013   Medicare annual wellness visit, subsequent 05/31/2012   Rotator cuff tendonitis, right 03/01/2012   Polycythemia 12/02/2011   Controlled diabetes mellitus type 2 with complications ([place])    Accelerated hypertension    Dyslipidemia associated with type 2 diabetes mellitus ([place])    History of recurrent UTIs    Allergic rhinitis    GERD (gastroesophageal reflux disease)    Rosacea    NAFLD (nonalcoholic fatty liver disease) 06/03/2010    Conditions to be addressed/monitored: HTN, DMII, and CKD   Care Plan : [facility] plan of care  Updates made by Dannielle Karvonen, RN since 06/10/2022 12:00 AM     Problem: Chronic disease management education and/ or care coordination needs.   Priority: High     Long-Range Goal: Development of plan of care to address chronic  disease management and/ or care coordination needs.   Start Date: 10/11/2021  Expected End Date: 07/03/2022  Priority: High  Note:   Resolving due to duplicate goal Current Barriers:  Knowledge Deficits related to plan of care for management of HTN and DMII  Chronic Disease Management support and education needs related to HTN and DMII Patient states she obtained her Creon medication. She reports self adjusting medication to 4 per day.   Patient reports fasting blood sugar today was 297. She contributes this to having ice cream last night.  Patient reports her blood sugars have ranged from low 100's to 330.  She reports having 6 hypoglycemic events within the past month.  Patient reports visit with new endocrinologist is 08/25/22. No appointment scheduled at this time with primary care provider.  Patient states she continues to monitor her blood pressures at home. States blood pressure  have been good.  RNCM Clinical Goal(s):  Patient will verbalize basic understanding of HTN and DMII disease process and self health management plan as evidenced by patient report and/ or notation in chart.  take all medications exactly as prescribed and will call provider for medication related questions as evidenced by patient report and/ or notation in chart.    attend all scheduled medical appointments:   as evidenced by patient report and / or notation in chart.         continue to work with Consulting civil engineer and/or Social Worker to address care management and care coordination needs related to HTN and DMII as evidenced by adherence to CM Team Scheduled appointments     through collaboration with Consulting civil engineer, provider, and care team.   Interventions: 1:1 collaboration with primary care provider regarding development and update of comprehensive plan of care as evidenced by provider attestation and co-signature Inter-disciplinary care team collaboration (see longitudinal plan of care) Evaluation of current treatment plan related to  self management and patient's adherence to plan as established by provider   Diabetes Interventions:  (Status:  Goal on track:  Yes.) Long Term Goal Assessed patient's understanding of A1c goal: <7% Reviewed medications with patient and discussed importance of medication adherence Discussed plans with patient for ongoing care management follow up and provided patient with direct contact information for care management team Reviewed scheduled/upcoming provider appointments  Discussed hypoglycemic / hyperglycemic management.  Reviewed Rule of 15 to manage hypoglycemic events Advised patient to notify her primary care either by MyChart or call to office regarding hypoglycemic events and self medication dose adjustment to Creon.    Lab Results  Component Value Date   HGBA1C 8.0 (A) 01/23/2022    Chronic Kidney Disease Interventions:  (Status:  Goal Met.) Long  Term Goal Evaluation of current treatment plan related to chronic kidney disease self management and patient's adherence to plan as established by provider      Reviewed medications with patient and discussed importance of compliance    Advised patient, providing education and rationale, to monitor blood pressure daily and record, calling PCP for findings outside established parameters    Reviewed scheduled/upcoming provider appointments   Hypertension Interventions:  (Status:  Goal Met.) Long Term Goal Last practice recorded BP readings:  BP Readings from Last 3 Encounters:  01/23/22 126/82  12/12/21 (!) 145/74  11/25/21 (!) 160/84  Most recent eGFR/CrCl: No results found for: "EGFR"  No components found for: "CRCL"  Evaluation of current treatment plan related to hypertension self management and patient's adherence to plan as established by  provider Reviewed medications with patient and discussed importance of compliance Reviewed scheduled/upcoming provider appointments  Advised patient to monitor blood pressure at least 2-3 times per week.  Notify provider of concerns. Advised to continue to  follow a low salt diet.    Patient Goals/Self-Care Activities: Continue to take medications as prescribed   Attend all scheduled provider appointments Call pharmacy for medication refills 3-7 days in advance of running out of medications Call provider office for new concerns or questions  Continue to monitor your blood sugars daily as advised by provider Continue to monitor blood pressure at least 2 - 3 times per week check blood sugar if I feel it is too high or too low ( treat accordingly) Continue to follow a low salt/ diabetic diet Continue to follow Rule of 15 for blood sugar management ( hypoglycemia):  Notify your primary care provider of recent hypoglycemic events and dose adjustments made to your Creon medication     RULE OF 15 How to treat low blood sugars (Blood sugar less than 70  mg/dl  Please follow the RULE OF 15 for the treatment of hypoglycemia treatment (When your blood sugars are less than 70 mg/ dl) STEP  1:  Take 15 grams of carbohydrates when your blood sugar is low, which includes:   3-4 glucose tabs or  3-4 oz of juice or regular soda or  One tube of glucose gel STEP 2:  Recheck blood sugar in 15 minutes STEP 3:  If your blood sugar is still low at the 15 minute recheck ---then, go back to STEP 1 and treat again with another 15 grams of carbohydrates       Plan: The care management team will reach out to the patient again over the next 2 months   Quinn Plowman [facility] RN Care Manager Coordinator [place]  [phone]

## 2022-06-29 NOTE — Progress Notes (Unsigned)
Name: Katherine Oliver  Age/ Sex: 76 y.o., female   MRN/ DOB: [id number], 14-Nov-1945     PCP: [name], MD   Reason for Endocrinology Evaluation: Type 2 Diabetes Mellitus  Initial Endocrine Consultative Visit: 11/25/2021    PATIENT IDENTIFIER: [name] is a 76 y.o. female with a past medical history of DM, HTN, Hx of pancreatitis . The patient has followed with Endocrinology clinic since 11/25/2021 for consultative assistance with management of her diabetes.  DIABETIC HISTORY:  [name] was diagnosed with DM 2015, and started insulin therapy in 2021.Pt has Hx of pancreatitis . Her hemoglobin A1c has ranged from 6.5% in 2016, peaking at 8.3% in 2023.   Hx of DKA in 2021  Saw Dr. Loanne Drilling from  2016 until 01/2022  SUBJECTIVE:   During the last visit (01/23/2022): saw Dr. Loanne Drilling   Today (06/30/2022): [name]  She checks her blood sugars multiple  times daily, through CGM. The patient has had hypoglycemic episodes since the last clinic visit, 6 episodes in the past 3 months mostly 70's. , but had a reading of 50's , she is typically she is symptomatic   The pt eats 3 meals a day, snacks occasionally twice a day.   Has nausea and diarrhea, which is chronic   She takes humalog at night around 9 pm with a snack starting at 8 units  She is on Creon     HOME DIABETES REGIMEN:  Lantus 22- 26 units daily  Humalog 10 units with breakfast, 12-14 units with lunch and 20 units with supper  CF : (BG-150/50)     Statin: yes ACE-I/ARB: yes     CONTINUOUS GLUCOSE MONITORING RECORD INTERPRETATION    Dates of Recording: 8/15-8/28/2023  Sensor description:dexcom  Results statistics:   CGM use % of time 93  Average and SD 195/47  Time in range   40     %  % Time Above 180 48   % Time above 250 12  % Time Below target 0    Glycemic patterns summary: BG's elevated at night and during the day   Hyperglycemic episodes  postprandial    Hypoglycemic episodes occurred n/a  Overnight periods: remains high      DIABETIC COMPLICATIONS: Microvascular complications:  CKD III Denies:  Last Eye Exam: Completed 03/24/2022  Macrovascular complications:   Denies: CAD, CVA, PVD   HISTORY:  Past Medical History:  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 nephropathy ([place])    DSME at [facility] 01/2016    DKA (diabetic ketoacidoses) 04/22/2020   Frequent epistaxis 05/16/2019   S/p cauterization with resolution 2020   GERD (gastroesophageal reflux disease)    Hepatic steatosis    by abd [place] 05/2012, mild transaminitis - normal iron sat and viral hep panel (2011), stable [place] 2017   History of chicken pox    History of measles    History of recurrent UTIs    on chronic keflex   HLD (hyperlipidemia)    HTN (hypertension)    Hypertensive retinopathy of both eyes, 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   Bul[place]   Kidney cyst, acquired 01/2016   L kidney by US   Kidney stone 01/2016   L kidney by [place]   Lung  nodules 11/2013   overall stable on f/u CT 01/2016   Osteopenia 06/2013   mild, forearm T -1.1, hip and spine WNL   Pancreatitis    Polycythemia    mild, stable (2013)   Primary localized osteoarthritis of right knee 01/05/2019   Rosacea    metrogel   Past Surgical History:  Past Surgical History:  Procedure Laterality Date   [place]  12/14/2019   Procedure: BILIARY STENT PLACEMENT;  Surgeon: [name], MD;  Location: [place];  Service: Endoscopy;;   BREAST BIOPSY Right 1963   benign   BREAST BIOPSY Right 08/2014   benign- core   cardiolite stress test  04/2004   normal   CESAREAN SECTION  8676;1950   x2   CHOLECYSTECTOMY  2003   COLONOSCOPY  09/26/2008   adenomatous polyp, rpt 3-5 yrs    COLONOSCOPY  08/2012   adenomatous polyps, diverticulosis, rec rpt 5 yrs [name])   COLONOSCOPY WITH PROPOFOL N/A 02/05/2018   4TA, SSA, diverticulosis, rpt 3 yrs [name], [name], MD)   dexa  2003   normal   dexa  06/2013   [facility] - Tscore -1.1 forearm, normal spine and femur   ERCP N/A 12/14/2019   Procedure: ENDOSCOPIC RETROGRADE CHOLANGIOPANCREATOGRAPHY (ERCP);  Surgeon: [name], MD;  Location: [place];  Service: Endoscopy;  Laterality: N/A;   ERCP  01/2020   nonbleeding gastric ulcer - [facility] hospitalization (Dr Mont Dutton)   ESOPHAGOGASTRODUODENOSCOPY N/A 12/19/2019   Procedure: ESOPHAGOGASTRODUODENOSCOPY (EGD);  Surgeon: [name], MD;  Location: [facility] ENDOSCOPY;  Service: Endoscopy;  Laterality: N/A;   ESOPHAGOGASTRODUODENOSCOPY  01/2020   pre existing AXIOS cystogastrostomy stent s/p necrosectomy - [facility] hospitalization (Dr Mont Dutton)   ESOPHAGOGASTRODUODENOSCOPY N/A 04/24/2020   Procedure: ESOPHAGOGASTRODUODENOSCOPY (EGD);  Surgeon: Wonda Horner, MD;  Location: [facility] ENDOSCOPY;  Service: Endoscopy;  Laterality: N/A;   ESOPHAGOGASTRODUODENOSCOPY  05/2020   normal esophagus, duodenum, erythematous mucosa in gastric body, gastric cystogastrostomy stent removed ([facility])   IR FLUORO GUIDE CV LINE RIGHT  12/27/2019   IR FLUORO GUIDE CV LINE RIGHT  04/24/2020   IR IVC FILTER PLMT / S&I /IMG GUID/MOD SED  04/26/2020   IR IVC FILTER RETRIEVAL / S&I /IMG GUID/MOD SED  07/31/2021   IR RADIOLOGIST EVAL & MGMT  07/31/2020   IR RADIOLOGIST EVAL & MGMT  01/15/2021   IR RADIOLOGIST EVAL & MGMT  06/19/2021   IR REMOVAL TUN CV CATH W/O FL  01/03/2020   IR REMOVAL TUN CV CATH W/O FL  04/29/2020   IR REPLC DUODEN/JEJUNO TUBE PERCUT W/FLUORO  04/01/2020   IR US GUIDE VASC ACCESS RIGHT  04/24/2020   SPHINCTEROTOMY  12/14/2019   Procedure: SPHINCTEROTOMY;  Surgeon: [name], MD;  Location: [place];  Service: Endoscopy;;   TOTAL KNEE ARTHROPLASTY Right 01/17/2019   Procedure: TOTAL KNEE  ARTHROPLASTY;  Surgeon: [name], MD;  Location: [facility];  Service: Orthopedics;  Laterality: Right;   TRANSTHORACIC ECHOCARDIOGRAM  04/2019   EF 55-60%, modLVH, impaired relaxation    TRIGGER FINGER RELEASE  2007;2010;2011   bilateral   TRIGGER FINGER RELEASE  02/2017   VAGINAL HYSTERECTOMY  1984   for menorrhagia, ovaries in place   Social History:  reports that she has never smoked. She has never been exposed to tobacco smoke. She has never used smokeless tobacco. She reports that she does not currently use alcohol. She reports that she does not use drugs. Family History:  Family History  Problem  Relation Age of Onset   Stroke Mother        several   Hyperlipidemia Mother    Hypertension Mother    Cancer Father        colon   Hypertension Father    Hyperlipidemia Father    Coronary artery disease Father 46       MIx1, CABG   Cancer Paternal Aunt        abdominal   Coronary artery disease Maternal Grandmother    Diabetes Maternal Grandfather    Coronary artery disease Maternal Grandfather    Breast cancer Neg Hx      HOME MEDICATIONS: Allergies as of 06/30/2022       Reactions   Azithromycin Itching   Okay if takes benadryl along with it   Nickel    Reaction to cheap earrings cause redness   Sulfa Antibiotics Itching   Vinegar [acetic Acid] Nausea Only   Adhesive [tape] Rash   Paper tape only causes blisters   Keflex [cephalexin] Rash        Medication List        Accurate as of June 30, 2022 10:48 AM. If you have any questions, ask your nurse or doctor.          STOP taking these medications    erythromycin ophthalmic ointment Stopped by: Dorita Sciara, MD   metFORMIN 500 MG 24 hr tablet Commonly known as: GLUCOPHAGE-XR Stopped by: Dorita Sciara, MD       TAKE these medications    Accu-Chek Aviva Plus test strip Generic drug: glucose blood 1 each by Other route 4 (four) times daily. And lancets 4/day   acetaminophen  500 MG tablet Commonly known as: TYLENOL Take 500 mg by mouth 2 (two) times daily.   amLODipine 10 MG tablet Commonly known as: NORVASC Take 1 tablet (10 mg total) by mouth daily.   amoxicillin 500 MG capsule Commonly known as: AMOXIL Take 2,000 mg by mouth See admin instructions. Take 2000 mg 1 hour prior to dental work   B-D UF III MINI PEN NEEDLES 31G X 5 MM Misc Generic drug: Insulin Pen Needle Inject into the skin 4 (four) times daily.   Baqsimi One Pack 3 MG/DOSE Powd Generic drug: Glucagon Place 3 mg into the nose daily as needed (hypoglycemia).   benazepril-hydrochlorthiazide 20-12.5 MG tablet Commonly known as: LOTENSIN HCT Take 0.5 tablets by mouth daily.   CRANBERRY PO Take 4,200 mg by mouth 2 (two) times daily.   CRANBERRY-VITAMIN C-D MANNOSE PO Take 1,500 mg by mouth daily.   Creon 36000 UNITS Cpep capsule Generic drug: lipase/protease/amylase TAKE 2 CAPSULES BY MOUTH AS DIRECTED (ONE CAPSULE WITH SNACKS, TWO CAPSULES WITH MEALS)   D-MANNOSE PO Take 2 tablets by mouth daily with lunch.   Dexcom G6 Sensor Misc 1 Device by Other route See admin instructions. Change every 10 days   diphenhydrAMINE 25 MG tablet Commonly known as: BENADRYL Take 25 mg by mouth 2 (two) times daily as needed for allergies.   estradiol 0.1 MG/GM vaginal cream Commonly known as: ESTRACE Estrogen Cream Instruction Discard applicator Apply pea sized amount to tip of finger to urethra before bed. Wash hands well after application. Use Monday, Wednesday and Friday   ferrous sulfate 325 (65 FE) MG tablet Take 1 tablet (325 mg total) by mouth daily with breakfast.   fluticasone 50 MCG/ACT nasal spray Commonly known as: FLONASE Place 1 spray into both nostrils daily.   folic acid 1  MG tablet Commonly known as: FOLVITE Take 1 tablet (1 mg total) by mouth daily.   insulin lispro 100 UNIT/ML injection Commonly known as: HUMALOG 4 times a day (just before each meal), 08-11-19-8  units.   Lantus SoloStar 100 UNIT/ML Solostar Pen Generic drug: insulin glargine Inject 22 Units into the skin at bedtime. And pen needles 5/day   loperamide 2 MG tablet Commonly known as: IMODIUM A-D Take 4 mg by mouth 4 (four) times daily as needed for diarrhea or loose stools.   LUBRICATING EYE DROPS OP Place 1 drop into both eyes 3 (three) times daily as needed (dry eyes).   meclizine 25 MG tablet Commonly known as: ANTIVERT Take 1 tablet (25 mg total) by mouth 3 (three) times daily as needed for nausea.   metoprolol succinate 50 MG 24 hr tablet Commonly known as: TOPROL-XL Take 1 tablet (50 mg total) by mouth daily. Take with or immediately following a meal.   multivitamin with minerals Tabs tablet Take 1 tablet by mouth daily.   pantoprazole 40 MG tablet Commonly known as: PROTONIX Take 1 tablet (40 mg total) by mouth every evening.   rosuvastatin 10 MG tablet Commonly known as: CRESTOR Take 1 tablet (10 mg total) by mouth daily.   Vitamin D3 25 MCG (1000 UT) Caps Take 2 capsules (2,000 Units total) by mouth daily.         OBJECTIVE:   Vital Signs: BP 124/80 (BP Location: Left Arm, Patient Position: Sitting, Cuff Size: Small)   Pulse 75   Ht '5\' 1"'$  (1.549 m)   Wt 171 lb 12.8 oz (77.9 kg)   SpO2 95%   BMI 32.46 kg/m   Wt Readings from Last 3 Encounters:  06/30/22 171 lb 12.8 oz (77.9 kg)  01/23/22 167 lb (75.8 kg)  12/12/21 160 lb (72.6 kg)     Exam: General: Pt appears well and is in NAD  Neck: General: Supple without adenopathy. Thyroid: Thyroid size normal.  No goiter or nodules appreciated.  Lungs: Clear with good BS bilat with no rales, rhonchi, or wheezes  Heart: RRR with normal S1 and S2 and no gallops; no murmurs; no rub  Extremities: No pretibial edema.  Neuro: MS is good with appropriate affect, pt is alert and Ox3    DM foot exam: 06/30/2022  The skin of the feet is intact without sores or ulcerations. The pedal pulses are 2+ on right  and 2+ on left. The sensation is intact to a screening 5.07, 10 gram monofilament bilaterally        DATA REVIEWED:  Lab Results  Component Value Date   HGBA1C 7.8 (A) 06/30/2022   HGBA1C 8.0 (A) 01/23/2022   HGBA1C 8.3 (A) 11/25/2021   Lab Results  Component Value Date   MICROALBUR 2.1 (H) 12/31/2015   LDLCALC 54 09/25/2020   CREATININE 1.20 10/04/2021   Lab Results  Component Value Date   MICRALBCREAT 1.6 12/31/2015     Lab Results  Component Value Date   CHOL 171 10/04/2021   HDL 53.40 10/04/2021   LDLCALC 54 09/25/2020   LDLDIRECT 77.0 10/04/2021   TRIG 321.0 (H) 10/04/2021   CHOLHDL 3 10/04/2021         ASSESSMENT / PLAN / RECOMMENDATIONS:   1) Type 2 Diabetes Mellitus, Sub-Optimally controlled, With CKD III complications - Most recent A1c of 7.8 %. Goal A1c < 7.0 %.     -A1c has slightly improved but continues to be above goal -In reviewing her  CGM data the patient has been noted with hypoglycemia overnight as well as during the day and at bedtime -She is NOT a candidate for GLP-1 agonist, DPP 4 inhibitors nor Mounjaro due to history of pancreatitis -I did advise the patient to avoid snacks and limit the amount of CHO intake with each meal as that should improve her weight gain and would reduce the amount of insulin needed -I will increase her insulin as below -I will also provide her with a new correction scale as below   MEDICATIONS: Increase Lantus 28 units daily Increase Humalog 16 units with breakfast, 16 units with lunch, and 20 units with supper Continue Humalog 8 units with bedtime snack Correction factor: Humalog (BG -130/25)  EDUCATION / INSTRUCTIONS: BG monitoring instructions: Patient is instructed to check her blood sugars 4 times a day, before each meal ad bedtime . Call [facility] if: BG persistently < 70  I reviewed the Rule of 15 for the treatment of hypoglycemia in detail with the patient. Literature  supplied.     2) Diabetic complications:  Eye: Does not have known diabetic retinopathy.  Neuro/ Feet: Does not have known diabetic peripheral neuropathy .  Renal: Patient does  have known baseline CKD. She   is  on an ACEI/ARB at present.   3) Weight gain :  -Unable to use GLP-1 agonists due to hx of pancresatitis  -Declined SGLT2 inhibitors due to history of recurrent genital infections -Emphasized the importance of avoiding snacks and reducing CHO intake with each meal as that would help not only calorie intake but also reduce the amount of insulin taking which insulin tends to cause more weight gain -Encouraged brisk walking   F/U in 4 months   Signed electronically by: Mack Guise, MD  [facility]  [address] [address] [place], [place] [address] Phone: [phone] FAX: [phone]   CC: [name], MD [place] [address] Phone: [phone]  Fax: [phone]  Return to Endocrinology clinic as below: Future Appointments  Date Time Provider [place]  06/30/2022 10:50 AM Raequon Catanzaro, Melanie Crazier, MD [facility] None  08/26/2022  1:30 PM Dannielle Karvonen, RN [facility] None  10/08/2022 12:00 PM [facility] NURSE HEALTH ADVISOR [facility] PEC  12/18/2022  2:00 PM Billey Co, MD [facility] None

## 2022-06-30 ENCOUNTER — Encounter: Payer: Self-pay | Admitting: Internal Medicine

## 2022-06-30 ENCOUNTER — Ambulatory Visit (INDEPENDENT_AMBULATORY_CARE_PROVIDER_SITE_OTHER): Payer: Medicare Other | Admitting: Internal Medicine

## 2022-06-30 VITALS — BP 124/80 | HR 75 | Ht 61.0 in | Wt 171.8 lb

## 2022-06-30 DIAGNOSIS — R635 Abnormal weight gain: Secondary | ICD-10-CM | POA: Diagnosis not present

## 2022-06-30 DIAGNOSIS — E118 Type 2 diabetes mellitus with unspecified complications: Secondary | ICD-10-CM | POA: Diagnosis not present

## 2022-06-30 DIAGNOSIS — E1165 Type 2 diabetes mellitus with hyperglycemia: Secondary | ICD-10-CM

## 2022-06-30 DIAGNOSIS — Z794 Long term (current) use of insulin: Secondary | ICD-10-CM | POA: Diagnosis not present

## 2022-06-30 LAB — POCT GLYCOSYLATED HEMOGLOBIN (HGB A1C): Hemoglobin A1C: 7.8 % — AB (ref 4.0–5.6)

## 2022-06-30 MED ORDER — HUMALOG KWIKPEN 200 UNIT/ML ~~LOC~~ SOPN: PEN_INJECTOR | SUBCUTANEOUS | 3 refills | Status: DC

## 2022-06-30 MED ORDER — BD PEN NEEDLE MINI U/F 31G X 5 MM MISC: 1.0000 | Freq: Four times a day (QID) | 3 refills | Status: DC

## 2022-06-30 MED ORDER — LANTUS SOLOSTAR 100 UNIT/ML ~~LOC~~ SOPN: 28.0000 [IU] | PEN_INJECTOR | Freq: Every day | SUBCUTANEOUS | 3 refills | Status: DC

## 2022-06-30 NOTE — Patient Instructions (Addendum)
Increase lantus 28 units daily  Increase Humalog 16 units with Breakfast, 16 units with Lunch and 20 units with Supper  Continue Humalog 8 units with a bedtime snack  Humalog correctional insulin: ADD extra units on insulin to your meal-time Humalog dose if your blood sugars are higher than 155. Use the scale below to help guide you:   Blood sugar before meal Number of units to inject  Less than 155 0 unit  156 -  180 1 units  181 -  205 2 units  206 -  230 3 units  231 -  255 4 units  256 -  280 5 units  281 -  305 6 units  306 -  330 7 units  331 -  355 8 units    HOW TO TREAT LOW BLOOD SUGARS (Blood sugar LESS THAN 70 MG/DL) Please follow the RULE OF 15 for the treatment of hypoglycemia treatment (when your (blood sugars are less than 70 mg/dL)   STEP 1: Take 15 grams of carbohydrates when your blood sugar is low, which includes:  3-4 GLUCOSE TABS  OR 3-4 OZ OF JUICE OR REGULAR SODA OR ONE TUBE OF GLUCOSE GEL    STEP 2: RECHECK blood sugar in 15 MINUTES STEP 3: If your blood sugar is still low at the 15 minute recheck --> then, go back to STEP 1 and treat AGAIN with another 15 grams of carbohydrates.

## 2022-07-01 ENCOUNTER — Encounter: Payer: Self-pay | Admitting: Internal Medicine

## 2022-07-01 DIAGNOSIS — R635 Abnormal weight gain: Secondary | ICD-10-CM | POA: Insufficient documentation

## 2022-07-22 DIAGNOSIS — Z86010 Personal history of colonic polyps: Secondary | ICD-10-CM | POA: Diagnosis not present

## 2022-07-22 DIAGNOSIS — D696 Thrombocytopenia, unspecified: Secondary | ICD-10-CM | POA: Diagnosis not present

## 2022-07-25 ENCOUNTER — Other Ambulatory Visit: Payer: Self-pay | Admitting: Family Medicine

## 2022-07-25 DIAGNOSIS — Z1231 Encounter for screening mammogram for malignant neoplasm of breast: Secondary | ICD-10-CM

## 2022-08-07 ENCOUNTER — Other Ambulatory Visit: Payer: Self-pay | Admitting: Family Medicine

## 2022-08-08 NOTE — Telephone Encounter (Signed)
Please schedule CPE for Dr. Danise Mina for after 10/08/22.

## 2022-08-11 NOTE — Telephone Encounter (Signed)
Patient has been scheduled

## 2022-08-11 NOTE — Telephone Encounter (Signed)
Noted  

## 2022-08-25 ENCOUNTER — Ambulatory Visit: Payer: Medicare Other | Admitting: Internal Medicine

## 2022-08-26 ENCOUNTER — Ambulatory Visit: Payer: Self-pay

## 2022-08-26 NOTE — Patient Outreach (Signed)
  Care Coordination   Follow Up Visit Note   08/26/2022 Name: Katherine Oliver MRN: [id number] DOB: June 22, 1946  Katherine Oliver is a 76 y.o. year old female who sees [name], MD for primary care. I spoke with  Gilmore Laroche Gorney by phone today.  What matters to the patients health and wellness today?  Patient states she is doing well. She reports having her initial consult visit with her new endocrinologist on 06/30/22.  She states she has a new treatment plan in place for diabetes management.   Patient reports having two low blood sugars since August.  She states her low blood sugars ranged in the 80's -90's.   She states her blood sugars range from 106 - 200's.  She states she continues to monitor her blood sugars with her DEXCOM.   Patient reports her Hgb A1c is 7.2 down from 8.7.  She reports next follow up with endocrinologist in January 2024.  Goals reviewed with patient.  Patient in agreement that goals have been met therefore will be closed to care coordination services.      Goals Addressed             This Visit's Progress    COMPLETED: Patient Stated: Review current diabetes treatment plan       Care Coordination Interventions: Reviewed/ discussed current treatment plan related to diabetes and patient's adherence to plan as established by provider Reviewed medications with patient and discussed importance of compliance Reviewed scheduled/upcoming provider appointments  Discussed rule of 15 / Hypoglycemic management Discussed options for evening snacks for patients managing diabetes:  Hard boiled egg, low fat cheese and whole wheat crackers, non starchy vegetables, celery sticks and hummus, sliced apples and peanut butter, sugar free greek yogurt Discussed current glucose readings Advised to monitor blood sugars at least 4-5 times per day and record.  Patient advised to call RNCM or her primary care provider if care coordination services needed  in the future. Confirmed patient has RNCM contact phone number for future reference.          SDOH assessments and interventions completed:  Yes  SDOH Interventions Today    Flowsheet Row Most Recent Value  SDOH Interventions   Food Insecurity Interventions Intervention Not Indicated  Housing Interventions Intervention Not Indicated        Care Coordination Interventions Activated:  Yes  Care Coordination Interventions:  Yes, provided   Follow up plan: No further intervention required.   Encounter Outcome:  Pt. Visit Completed   Quinn Plowman RN,BSN,CCM [place] [phone] direct line

## 2022-09-02 ENCOUNTER — Ambulatory Visit (INDEPENDENT_AMBULATORY_CARE_PROVIDER_SITE_OTHER): Payer: Medicare Other

## 2022-09-02 DIAGNOSIS — Z23 Encounter for immunization: Secondary | ICD-10-CM

## 2022-09-02 NOTE — Progress Notes (Signed)
Patient presented for flu shot given by Sherrilee Gilles, CMA to left deltoid, patient voiced no concerns nor showed any signs of distress during injection.

## 2022-09-11 ENCOUNTER — Other Ambulatory Visit: Payer: Self-pay | Admitting: Family Medicine

## 2022-09-11 DIAGNOSIS — E1169 Type 2 diabetes mellitus with other specified complication: Secondary | ICD-10-CM

## 2022-09-11 DIAGNOSIS — I10 Essential (primary) hypertension: Secondary | ICD-10-CM

## 2022-09-18 ENCOUNTER — Ambulatory Visit
Admission: RE | Admit: 2022-09-18 | Discharge: 2022-09-18 | Disposition: A | Discharge status: Home / Self Care | Payer: Medicare Other | Source: Ambulatory Visit | Attending: Family Medicine | Admitting: Family Medicine

## 2022-09-18 DIAGNOSIS — Z1231 Encounter for screening mammogram for malignant neoplasm of breast: Secondary | ICD-10-CM | POA: Insufficient documentation

## 2022-09-18 DIAGNOSIS — Z780 Asymptomatic menopausal state: Secondary | ICD-10-CM | POA: Diagnosis not present

## 2022-09-18 DIAGNOSIS — M8589 Other specified disorders of bone density and structure, multiple sites: Secondary | ICD-10-CM | POA: Diagnosis not present

## 2022-09-26 ENCOUNTER — Other Ambulatory Visit: Payer: Self-pay | Admitting: Family Medicine

## 2022-10-08 ENCOUNTER — Ambulatory Visit (INDEPENDENT_AMBULATORY_CARE_PROVIDER_SITE_OTHER): Payer: Medicare Other

## 2022-10-08 VITALS — Ht 61.0 in | Wt 171.0 lb

## 2022-10-08 DIAGNOSIS — Z0000 Encounter for general adult medical examination without abnormal findings: Secondary | ICD-10-CM

## 2022-10-08 NOTE — Patient Instructions (Signed)
[name] , Thank you for taking time to come for your Medicare Wellness Visit. I appreciate your ongoing commitment to your health goals. Please review the following plan we discussed and let me know if I can assist you in the future.   Screening recommendations/referrals: Colonoscopy: 04/26/20, every 5 years;  FOBT 04/26/20  Mammogram: 09/18/22 Bone Density: 09/18/22 Recommended yearly ophthalmology/optometry visit for glaucoma screening and checkup Recommended yearly dental visit for hygiene and checkup  Vaccinations: Influenza vaccine: 09/02/22 Pneumococcal vaccine: 06/30/14 Tdap vaccine: 08/10/17 Shingles vaccine: Zostavax 11/03/08   Covid-19:11/17/19, 12/08/19  Advanced directives: yes  Conditions/risks identified: none  Next appointment: Follow up in one year for your annual wellness visit 10/12/23 @ 10:45 am by phone   Preventive Care 65 Years and Older, Female Preventive care refers to lifestyle choices and visits with your health care provider that can promote health and wellness. What does preventive care include? A yearly physical exam. This is also called an annual well check. Dental exams once or twice a year. Routine eye exams. Ask your health care provider how often you should have your eyes checked. Personal lifestyle choices, including: Daily care of your teeth and gums. Regular physical activity. Eating a healthy diet. Avoiding tobacco and drug use. Limiting alcohol use. Practicing safe sex. Taking low-dose aspirin every day. Taking vitamin and mineral supplements as recommended by your health care provider. What happens during an annual well check? The services and screenings done by your health care provider during your annual well check will depend on your age, overall health, lifestyle risk factors, and family history of disease. Counseling  Your health care provider may ask you questions about your: Alcohol use. Tobacco use. Drug use. Emotional  well-being. Home and relationship well-being. Sexual activity. Eating habits. History of falls. Memory and ability to understand (cognition). Work and work Statistician. Reproductive health. Screening  You may have the following tests or measurements: Height, weight, and BMI. Blood pressure. Lipid and cholesterol levels. These may be checked every 5 years, or more frequently if you are over 4 years old. Skin check. Lung cancer screening. You may have this screening every year starting at age 42 if you have a 30-pack-year history of smoking and currently smoke or have quit within the past 15 years. Fecal occult blood test (FOBT) of the stool. You may have this test every year starting at age 42. Flexible sigmoidoscopy or colonoscopy. You may have a sigmoidoscopy every 5 years or a colonoscopy every 10 years starting at age 28. Hepatitis C blood test. Hepatitis B blood test. Sexually transmitted disease (STD) testing. Diabetes screening. This is done by checking your blood sugar (glucose) after you have not eaten for a while (fasting). You may have this done every 1-3 years. Bone density scan. This is done to screen for osteoporosis. You may have this done starting at age 21. Mammogram. This may be done every 1-2 years. Talk to your health care provider about how often you should have regular mammograms. Talk with your health care provider about your test results, treatment options, and if necessary, the need for more tests. Vaccines  Your health care provider may recommend certain vaccines, such as: Influenza vaccine. This is recommended every year. Tetanus, diphtheria, and acellular pertussis (Tdap, Td) vaccine. You may need a Td booster every 10 years. Zoster vaccine. You may need this after age 82. Pneumococcal 13-valent conjugate (PCV13) vaccine. One dose is recommended after age 25. Pneumococcal polysaccharide (PPSV23) vaccine. One dose is recommended after  age 2. Talk to your  health care provider about which screenings and vaccines you need and how often you need them. This information is not intended to replace advice given to you by your health care provider. Make sure you discuss any questions you have with your health care provider. Document Released: 11/16/2015 Document Revised: 07/09/2016 Document Reviewed: 08/21/2015 Elsevier Interactive Patient Education  2017 [place] Prevention in the Home Falls can cause injuries. They can happen to people of all ages. There are many things you can do to make your home safe and to help prevent falls. What can I do on the outside of my home? Regularly fix the edges of walkways and driveways and fix any cracks. Remove anything that might make you trip as you walk through a door, such as a raised step or threshold. Trim any bushes or trees on the path to your home. Use bright outdoor lighting. Clear any walking paths of anything that might make someone trip, such as rocks or tools. Regularly check to see if handrails are loose or broken. Make sure that both sides of any steps have handrails. Any raised decks and porches should have guardrails on the edges. Have any leaves, snow, or ice cleared regularly. Use sand or salt on walking paths during winter. Clean up any spills in your garage right away. This includes oil or grease spills. What can I do in the bathroom? Use night lights. Install grab bars by the toilet and in the tub and shower. Do not use towel bars as grab bars. Use non-skid mats or decals in the tub or shower. If you need to sit down in the shower, use a plastic, non-slip stool. Keep the floor dry. Clean up any water that spills on the floor as soon as it happens. Remove soap buildup in the tub or shower regularly. Attach bath mats securely with double-sided non-slip rug tape. Do not have throw rugs and other things on the floor that can make you trip. What can I do in the bedroom? Use night  lights. Make sure that you have a light by your bed that is easy to reach. Do not use any sheets or blankets that are too big for your bed. They should not hang down onto the floor. Have a firm chair that has side arms. You can use this for support while you get dressed. Do not have throw rugs and other things on the floor that can make you trip. What can I do in the kitchen? Clean up any spills right away. Avoid walking on wet floors. Keep items that you use a lot in easy-to-reach places. If you need to reach something above you, use a strong step stool that has a grab bar. Keep electrical cords out of the way. Do not use floor polish or wax that makes floors slippery. If you must use wax, use non-skid floor wax. Do not have throw rugs and other things on the floor that can make you trip. What can I do with my stairs? Do not leave any items on the stairs. Make sure that there are handrails on both sides of the stairs and use them. Fix handrails that are broken or loose. Make sure that handrails are as long as the stairways. Check any carpeting to make sure that it is firmly attached to the stairs. Fix any carpet that is loose or worn. Avoid having throw rugs at the top or bottom of the stairs. If you do  have throw rugs, attach them to the floor with carpet tape. Make sure that you have a light switch at the top of the stairs and the bottom of the stairs. If you do not have them, ask someone to add them for you. What else can I do to help prevent falls? Wear shoes that: Do not have high heels. Have rubber bottoms. Are comfortable and fit you well. Are closed at the toe. Do not wear sandals. If you use a stepladder: Make sure that it is fully opened. Do not climb a closed stepladder. Make sure that both sides of the stepladder are locked into place. Ask someone to hold it for you, if possible. Clearly mark and make sure that you can see: Any grab bars or handrails. First and last  steps. Where the edge of each step is. Use tools that help you move around (mobility aids) if they are needed. These include: Canes. Walkers. Scooters. Crutches. Turn on the lights when you go into a dark area. Replace any light bulbs as soon as they burn out. Set up your furniture so you have a clear path. Avoid moving your furniture around. If any of your floors are uneven, fix them. If there are any pets around you, be aware of where they are. Review your medicines with your doctor. Some medicines can make you feel dizzy. This can increase your chance of falling. Ask your doctor what other things that you can do to help prevent falls. This information is not intended to replace advice given to you by your health care provider. Make sure you discuss any questions you have with your health care provider. Document Released: 08/16/2009 Document Revised: 03/27/2016 Document Reviewed: 11/24/2014 Elsevier Interactive Patient Education  2017 [employer].

## 2022-10-08 NOTE — Progress Notes (Signed)
Virtual Visit via Telephone Note  I connected with  Katherine Oliver on 10/08/22 at  3:30 PM EST by telephone and verified that I am speaking with the correct person using two identifiers.  Location: Patient: home Provider: [place] Persons participating in the virtual visit: Katherine Oliver   I discussed the limitations, risks, security and privacy concerns of performing an evaluation and management service by telephone and the availability of in person appointments. The patient expressed understanding and agreed to proceed.  Interactive audio and video telecommunications were attempted between this nurse and patient, however failed, due to patient having technical difficulties OR patient did not have access to video capability.  We continued and completed visit with audio only.  Some vital signs may be absent or patient reported.   Katherine David, LPN  Subjective:   Katherine Oliver is a 76 y.o. female who presents for Medicare Annual (Subsequent) preventive examination.  Review of Systems     Cardiac Risk Factors include: advanced age (>28mn, >>45women);diabetes mellitus;hypertension;dyslipidemia     Objective:    Today's Vitals   10/08/22 1532  PainSc: 3    There is no height or weight on file to calculate BMI.     10/08/2022    3:40 PM 10/03/2021    2:07 PM 07/31/2021    7:25 AM 07/09/2021    1:09 PM 09/25/2020   11:30 AM 04/24/2020    7:00 PM 04/24/2020    1:33 PM  Advanced Directives  Does Patient Have a Medical Advance Directive? Yes Yes Yes Yes No  Yes  Type of AP[place]amed[place]of [place]Living will [place]Living will [place]Living will [place] will   [place] Does patient want to make changes to medical advance directive? No - Patient declined Yes (MAU/Ambulatory/Procedural Areas - Information given) No - Patient  declined No - Patient declined  No - Patient declined   Copy of [place] Chart? Yes - validated most recent copy scanned in chart (See row information)        Would patient like information on creating a medical advance directive?     Yes (MAU/Ambulatory/Procedural Areas - Information given)      Current Medications (verified) Outpatient Encounter Medications as of 10/08/2022  Medication Sig   acetaminophen (TYLENOL) 500 MG tablet Take 500 mg by mouth 2 (two) times daily.   amLODipine (NORVASC) 10 MG tablet TAKE 1 TABLET BY MOUTH DAILY   B-D UF III MINI PEN NEEDLES 31G X 5 MM MISC Inject 1 Device into the skin 4 (four) times daily.   benazepril-hydrochlorthiazide (LOTENSIN HCT) 20-12.5 MG tablet TAKE ONE-HALF TABLET BY MOUTH  DAILY   Cholecalciferol (VITAMIN D3) 25 MCG (1000 UT) CAPS Take 2 capsules (2,000 Units total) by mouth daily.   Continuous Blood Gluc Sensor (DEXCOM G6 SENSOR) MISC 1 Device by Other route See admin instructions. Change every 10 days   CRANBERRY PO Take 4,200 mg by mouth 2 (two) times daily.   CRANBERRY-VITAMIN C-D MANNOSE PO Take 1,500 mg by mouth daily.   CREON 36000-114000 units CPEP capsule TAKE 2 CAPSULES BY MOUTH AS DIRECTED (ONE CAPSULE WITH SNACKS, TWO CAPSULES WITH MEALS)   D-MANNOSE PO Take 2 tablets by mouth daily with lunch.   diphenhydrAMINE (BENADRYL) 25 MG tablet Take 25 mg by mouth 2 (two) times daily as needed for allergies.   estradiol (ESTRACE) 0.1 MG/GM vaginal  cream Estrogen Cream Instruction Discard applicator Apply pea sized amount to tip of finger to urethra before bed. Wash hands well after application. Use Monday, Wednesday and Friday   ferrous sulfate 325 (65 FE) MG tablet Take 1 tablet (325 mg total) by mouth daily with breakfast.   fluticasone (FLONASE) 50 MCG/ACT nasal spray Place 1 spray into both nostrils daily.   folic acid (FOLVITE) 1 MG tablet TAKE 1 TABLET BY MOUTH DAILY   Glucagon (BAQSIMI ONE PACK) 3 MG/DOSE  POWD Place 3 mg into the nose daily as needed (hypoglycemia).   glucose blood (ACCU-CHEK AVIVA PLUS) test strip 1 each by Other route 4 (four) times daily. And lancets 4/day   insulin glargine (LANTUS SOLOSTAR) 100 UNIT/ML Solostar Pen Inject 28 Units into the skin at bedtime.   insulin lispro (HUMALOG KWIKPEN) 200 UNIT/ML KwikPen Max daily 100 units   loperamide (IMODIUM A-D) 2 MG tablet Take 4 mg by mouth 4 (four) times daily as needed for diarrhea or loose stools.   meclizine (ANTIVERT) 25 MG tablet Take 1 tablet (25 mg total) by mouth 3 (three) times daily as needed for nausea.   metoprolol succinate (TOPROL-XL) 50 MG 24 hr tablet TAKE 1 TABLET BY MOUTH DAILY  WITH OR IMMEDIATELY FOLLOWING A  MEAL   Multiple Vitamin (MULTIVITAMIN WITH MINERALS) TABS tablet Take 1 tablet by mouth daily.   pantoprazole (PROTONIX) 40 MG tablet TAKE 1 TABLET BY MOUTH IN THE  EVENING   rosuvastatin (CRESTOR) 10 MG tablet TAKE 1 TABLET BY MOUTH DAILY   amoxicillin (AMOXIL) 500 MG capsule Take 2,000 mg by mouth See admin instructions. Take 2000 mg 1 hour prior to dental work (Patient not taking: Reported on 06/30/2022)   [DISCONTINUED] Carboxymethylcellul-Glycerin (LUBRICATING EYE DROPS OP) Place 1 drop into both eyes 3 (three) times daily as needed (dry eyes). (Patient not taking: Reported on 10/08/2022)   No facility-administered encounter medications on file as of 10/08/2022.    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 nephropathy ([place])    DSME at [facility] 01/2016    DKA (diabetic ketoacidoses) 04/22/2020   Frequent epistaxis 05/16/2019   S/p cauterization with resolution 2020    GERD (gastroesophageal reflux disease)    Hepatic steatosis    by abd [place] 05/2012, mild transaminitis - normal iron sat and viral hep panel (2011), stable [place] 2017   History of chicken pox    History of measles    History of recurrent UTIs    on chronic keflex   HLD (hyperlipidemia)    HTN (hypertension)    Hypertensive retinopathy of both eyes, 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   Bulakowski   Kidney cyst, acquired 01/2016   L kidney by US   Kidney stone 01/2016   L kidney by [place]   Lung nodules 11/2013   overall stable on f/u CT 01/2016   Osteopenia 06/2013   mild, forearm T -1.1, hip and spine WNL   Pancreatitis    Polycythemia    mild, stable (2013)   Primary localized osteoarthritis of right knee 01/05/2019   Rosacea    metrogel   Past Surgical History:  Procedure Laterality Date  APPENDECTOMY  1987   BILIARY STENT PLACEMENT  12/14/2019   Procedure: BILIARY STENT PLACEMENT;  Surgeon: [name], MD;  Location: [place];  Service: Endoscopy;;   BREAST BIOPSY Right 1963   benign   BREAST BIOPSY Right 08/2014   benign- core   cardiolite stress test  04/2004   normal   CESAREAN SECTION  2703;5009   x2   CHOLECYSTECTOMY  2003   COLONOSCOPY  09/26/2008   adenomatous polyp, rpt 3-5 yrs   COLONOSCOPY  08/2012   adenomatous polyps, diverticulosis, rec rpt 5 yrs [name])   COLONOSCOPY WITH PROPOFOL N/A 02/05/2018   4TA, SSA, diverticulosis, rpt 3 yrs [name], [name], MD)   dexa  2003   normal   dexa  06/2013   [facility] - Tscore -1.1 forearm, normal spine and femur   ERCP N/A 12/14/2019   Procedure: ENDOSCOPIC RETROGRADE CHOLANGIOPANCREATOGRAPHY (ERCP);  Surgeon: [name], MD;  Location: [place];  Service: Endoscopy;  Laterality: N/A;   ERCP  01/2020   nonbleeding gastric ulcer - [facility] hospitalization (Dr Mont Dutton)   ESOPHAGOGASTRODUODENOSCOPY N/A 12/19/2019   Procedure: ESOPHAGOGASTRODUODENOSCOPY (EGD);  Surgeon: [name], MD;  Location: [facility] ENDOSCOPY;  Service: Endoscopy;   Laterality: N/A;   ESOPHAGOGASTRODUODENOSCOPY  01/2020   pre existing AXIOS cystogastrostomy stent s/p necrosectomy - [facility] hospitalization (Dr Mont Dutton)   ESOPHAGOGASTRODUODENOSCOPY N/A 04/24/2020   Procedure: ESOPHAGOGASTRODUODENOSCOPY (EGD);  Surgeon: Wonda Horner, MD;  Location[facility] ENDOSCOPY;  Service: Endoscopy;  Laterality: N/A;   ESOPHAGOGASTRODUODENOSCOPY  05/2020   normal esophagus, duodenum, erythematous mucosa in gastric body, gastric cystogastrostomy stent removed ([facility])   IR FLUORO GUIDE CV LINE RIGHT  12/27/2019   IR FLUORO GUIDE CV LINE RIGHT  04/24/2020   IR IVC FILTER PLMT / S&I /IMG GUID/MOD SED  04/26/2020   IR IVC FILTER RETRIEVAL / S&I /IMG GUID/MOD SED  07/31/2021   IR RADIOLOGIST EVAL & MGMT  07/31/2020   IR RADIOLOGIST EVAL & MGMT  01/15/2021   IR RADIOLOGIST EVAL & MGMT  06/19/2021   IR REMOVAL TUN CV CATH W/O FL  01/03/2020   IR REMOVAL TUN CV CATH W/O FL  04/29/2020   IR REPLC DUODEN/JEJUNO TUBE PERCUT W/FLUORO  04/01/2020   IR US GUIDE VASC ACCESS RIGHT  04/24/2020   SPHINCTEROTOMY  12/14/2019   Procedure: SPHINCTEROTOMY;  Surgeon: [name], MD;  Location: [place];  Service: Endoscopy;;   TOTAL KNEE ARTHROPLASTY Right 01/17/2019   Procedure: TOTAL KNEE ARTHROPLASTY;  Surgeon: [name], MD;  Location: [facility];  Service: Orthopedics;  Laterality: Right;   TRANSTHORACIC ECHOCARDIOGRAM  04/2019   EF 55-60%, modLVH, impaired relaxation    TRIGGER FINGER RELEASE  2007;2010;2011   bilateral   TRIGGER FINGER RELEASE  02/2017   VAGINAL HYSTERECTOMY  1984   for menorrhagia, ovaries in place   Family History  Problem Relation Age of Onset   Stroke Mother        several   Hyperlipidemia Mother    Hypertension Mother    Cancer Father        colon   Hypertension Father    Hyperlipidemia Father    Coronary artery disease Father 9       MIx1, CABG   Cancer Paternal Aunt        abdominal   Coronary artery disease Maternal Grandmother    Diabetes Maternal  Grandfather    Coronary artery disease Maternal Grandfather    Breast cancer Neg Hx    Social History  Socioeconomic History   Marital status: Married    Spouse name: Not on file   Number of children: Not on file   Years of education: Not on file   Highest education level: Not on file  Occupational History   Not on file  Tobacco Use   Smoking status: Never    Passive exposure: Never   Smokeless tobacco: Never  Vaping Use   Vaping Use: Never used  Substance and Sexual Activity   Alcohol use: Not Currently   Drug use: No   Sexual activity: Yes  Other Topics Concern   Not on file  Social History Narrative   B+ blood type   Caffeine: 2 cups coffee/day   Lives with husband, no pets, grown children ([place] and [place])   Occupation: retired Pharmacist, 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 Daily walking about 20 min.    Diet: ok water intake 4 glasses/day, daily fruits/vegetables, red meat 4x/wk, fish 3-4x/wk   Social Determinants of Health   Financial Resource Strain: Low Risk  (10/08/2022)   Overall Financial Resource Strain (CARDIA)    Difficulty of Paying Living Expenses: Not hard at all  Food Insecurity: No Food Insecurity (10/08/2022)   Hunger Vital Sign    Worried About Running Out of Food in the Last Year: Never true    Ran Out of Food in the Last Year: Never true  Transportation Needs: No Transportation Needs (10/08/2022)   PRAPARE - Hydrologist (Medical): No    Lack of Transportation (Non-Medical): No  Physical Activity: Insufficiently Active (10/08/2022)   Exercise Vital Sign    Days of Exercise per Week: 3 days    Minutes of Exercise per Session: 30 min  Stress: No Stress Concern Present (10/08/2022)   [place]    Feeling of Stress : Not at all  Social Connections: Moderately Isolated (10/08/2022)   Social  Connection and Isolation Panel [NHANES]    Frequency of Communication with Friends and Family: Twice a week    Frequency of Social Gatherings with Friends and Family: Twice a week    Attends Religious Services: Never    Printmaker: No    Attends Music therapist: Never    Marital Status: Married    Tobacco Counseling Counseling given: Not Answered   Clinical Intake:  Pre-visit preparation completed: Yes  Pain : 0-10 Pain Score: 3  Pain Type: Chronic pain Pain Location: Back     Nutritional Risks: None Diabetes: Yes CBG done?: No Did pt. bring in CBG monitor from home?: No  How often do you need to have someone help you when you read instructions, pamphlets, or other written materials from your doctor or pharmacy?: 1 - Never  Diabetic?yes Nutrition Risk Assessment:  Has the patient had any N/V/D within the last 2 months?  Yes  Does the patient have any non-healing wounds?  No  Has the patient had any unintentional weight loss or weight gain?  No   Diabetes:  Is the patient diabetic?  Yes  If diabetic, was a CBG obtained today?  No  Did the patient bring in their glucometer from home?  No  How often do you monitor your CBG's? continuous.   Financial Strains and Diabetes Management:  Are you having any financial strains with the device, your supplies or your medication? No .  Does the patient want to be seen by Chronic Care Management for management of their diabetes?  No  Would the patient like to be referred to a Nutritionist or for Diabetic Management?  No   Diabetic Exams:  Diabetic Eye Exam: Completed 03/24/22.  Pt has been advised about the importance in completing this exam.   Diabetic Foot Exam: Completed 06/30/22. Pt has been advised about the importance in completing this exam.     Interpreter Needed?: No  Information entered by :: Kirke Shaggy, LPN   Activities of Daily Living    10/08/2022    3:40 PM   In your present state of health, do you have any difficulty performing the following activities:  Hearing? 0  Vision? 0  Difficulty concentrating or making decisions? 0  Walking or climbing stairs? 0  Dressing or bathing? 0  Doing errands, shopping? 0  Preparing Food and eating ? N  Using the Toilet? N  In the past six months, have you accidently leaked urine? N  Do you have problems with loss of bowel control? N  Managing your Medications? N  Managing your Finances? N  Housekeeping or managing your Housekeeping? N    Patient Care Team: [name], MD as PCP - General (Family Medicine) [name], MD as Consulting Physician (Orthopedic Surgery) Ralene Bathe, MD as Consulting Physician (Dermatology) Johnnette Litter, MD as Consulting Physician (Dentistry) Thelma Comp, OD as Consulting Physician (Optometry)  Indicate any recent Medical Services you may have received from other than [facility] providers in the past year (date may be approximate).     Assessment:   This is a routine wellness examination for [place].  Hearing/Vision screen Hearing Screening - Comments:: No aids Vision Screening - Comments:: Wears glasses- Dr.Bulakowski  Dietary issues and exercise activities discussed: Current Exercise Habits: Home exercise routine, Type of exercise: walking, Time (Minutes): 30, Frequency (Times/Week): 3, Weekly Exercise (Minutes/Week): 90   Goals Addressed             This Visit's Progress    DIET - EAT MORE FRUITS AND VEGETABLES         Depression Screen    10/08/2022    3:38 PM 10/03/2021    2:10 PM 07/09/2021    1:24 PM 09/25/2020   11:38 AM 05/10/2020    3:34 PM 08/23/2019    2:04 PM 08/16/2018   12:03 PM  PHQ 2/9 Scores  PHQ - 2 Score 0 0 0 0 0 0 0  PHQ- 9 Score 0   0  0 0    Fall Risk    10/08/2022    3:40 PM 10/03/2021    2:08 PM 07/09/2021    1:24 PM 09/25/2020   11:34 AM 05/10/2020    3:41 PM  Fall Risk   Falls in the past year? 0 0 0 1 1   Number falls in past yr: 0 0 0 1 0  Injury with Fall? 0 0  0 1  Risk for fall due to : No Fall Risks No Fall Risks  Medication side effect Impaired balance/gait  Follow up Falls prevention discussed;Falls evaluation completed Falls prevention discussed  Falls evaluation completed;Falls prevention discussed Falls prevention discussed  Comment     Much improved    FALL RISK PREVENTION PERTAINING TO THE HOME:  Any stairs in or around the home? Yes  If so, are there any without handrails? No  Home free of loose throw rugs in walkways, pet beds, electrical cords, etc?  Yes  Adequate lighting in your home to reduce risk of falls? Yes   ASSISTIVE DEVICES UTILIZED TO PREVENT FALLS:  Life alert? No  Use of a cane, walker or w/c? No  Grab bars in the bathroom? Yes  Shower chair or bench in shower? Yes  Elevated toilet seat or a handicapped toilet? Yes    Cognitive Function:    09/25/2020   11:39 AM 08/23/2019    2:07 PM 08/16/2018   12:04 PM 08/10/2017    9:05 AM 07/14/2016   11:26 AM  MMSE - Mini Mental State Exam  Orientation to time '5 5 5 5 5  '$ Orientation to Place '5 5 5 5 5  '$ Registration '3 3 3 3 3  '$ Attention/ Calculation 5 5 0 0 0  Recall '3 3 3 3 3  '$ Language- name 2 objects   0 0 0  Language- repeat '1 1 1 1 1  '$ Language- follow 3 step command   '3 3 3  '$ Language- read & follow direction   0 0 0  Write a sentence   0 0 0  Copy design   0 0 0  Total score   '20 20 20        '$ 10/08/2022    3:47 PM  6CIT Screen  What Year? 0 points  What month? 0 points  What time? 0 points  Count back from 20 0 points  Months in reverse 0 points  Repeat phrase 2 points  Total Score 2 points    Immunizations Immunization History  Administered Date(s) Administered   Fluad Quad(high Dose 65+) 07/28/2019, 07/17/2021, 09/02/2022   Influenza, High Dose Seasonal PF 08/10/2017, 08/16/2018, 08/15/2020   Influenza,inj,Quad PF,6+ Mos 08/02/2014, 07/05/2015, 07/14/2016   Influenza-Unspecified  08/03/2013, 07/17/2021   PFIZER(Purple Top)SARS-COV-2 Vaccination 11/17/2019, 12/08/2019   Pneumococcal Conjugate-13 06/30/2014   Pneumococcal Polysaccharide-23 11/03/2002, 09/20/2007, 05/31/2012   Td 01/20/2005, 08/10/2017   Zoster, Live 11/03/2008    TDAP status: Up to date  Flu Vaccine status: Up to date  Pneumococcal vaccine status: Up to date  Covid-19 vaccine status: Completed vaccines  Qualifies for Shingles Vaccine? Yes   Zostavax completed Yes   Shingrix Completed?: No.    Education has been provided regarding the importance of this vaccine. Patient has been advised to call insurance company to determine out of pocket expense if they have not yet received this vaccine. Advised may also receive vaccine at local pharmacy or Health Dept. Verbalized acceptance and understanding.  Screening Tests Health Maintenance  Topic Date Due   Zoster Vaccines- Shingrix (1 of 2) Never done   COVID-19 Vaccine (3 - Pfizer risk series) 01/05/2020   Diabetic kidney evaluation - Urine ACR  07/17/2022   Diabetic kidney evaluation - GFR measurement  10/04/2022   HEMOGLOBIN A1C  12/31/2022   COLONOSCOPY (Pts 45-60yr Insurance coverage will need to be confirmed)  02/06/2023   OPHTHALMOLOGY EXAM  03/25/2023   FOOT EXAM  07/01/2023   MAMMOGRAM  09/19/2023   Medicare Annual Wellness (AWV)  10/09/2023   DTaP/Tdap/Td (3 - Tdap) 08/11/2027   Pneumonia Vaccine 76 Years old  Completed   INFLUENZA VACCINE  Completed   DEXA SCAN  Completed   Hepatitis C Screening  Completed   HPV VACCINES  Aged Out    Health Maintenance  Health Maintenance Due  Topic Date Due   Zoster Vaccines- Shingrix (1 of 2) Never done   COVID-19 Vaccine (3 - Pfizer risk series) 01/05/2020   Diabetic kidney evaluation -  Urine ACR  07/17/2022   Diabetic kidney evaluation - GFR measurement  10/04/2022    Colorectal cancer screening: Type of screening: FOBT/FIT. Completed 04/26/20. Repeat every 1 years  Mammogram status:  Completed 09/18/22. Repeat every year  Bone Density status: Completed 09/18/22. Results reflect: Bone density results: OSTEOPENIA. Repeat every 5 years.  Lung Cancer Screening: (Low Dose CT Chest recommended if Age 11-80 years, 30 pack-year currently smoking OR have quit w/in 15years.) does not qualify.   Additional Screening:  Hepatitis C Screening: does qualify; Completed 07/31/16  Vision Screening: Recommended annual ophthalmology exams for early detection of glaucoma and other disorders of the eye. Is the patient up to date with their annual eye exam?  Yes  Who is the provider or what is the name of the office in which the patient attends annual eye exams? Dr.Bulakowski If pt is not established with a provider, would they like to be referred to a provider to establish care? No .   Dental Screening: Recommended annual dental exams for proper oral hygiene  Community Resource Referral / Chronic Care Management: CRR required this visit?  No   CCM required this visit?  No      Plan:     I have personally reviewed and noted the following in the patient's chart:   Medical and social history Use of alcohol, tobacco or illicit drugs  Current medications and supplements including opioid prescriptions. Patient is not currently taking opioid prescriptions. Functional ability and status Nutritional status Physical activity Advanced directives List of other physicians Hospitalizations, surgeries, and ER visits in previous 12 months Vitals Screenings to include cognitive, depression, and falls Referrals and appointments  In addition, I have reviewed and discussed with patient certain preventive protocols, quality metrics, and best practice recommendations. A written personalized care plan for preventive services as well as general preventive health recommendations were provided to patient.     Katherine David, LPN   10/04/4824   Nurse Notes: none

## 2022-10-09 ENCOUNTER — Other Ambulatory Visit: Payer: Self-pay | Admitting: Family Medicine

## 2022-10-09 DIAGNOSIS — I10 Essential (primary) hypertension: Secondary | ICD-10-CM

## 2022-10-14 ENCOUNTER — Other Ambulatory Visit: Payer: Self-pay

## 2022-10-14 ENCOUNTER — Ambulatory Visit: Payer: Medicare Other | Admitting: Anesthesiology

## 2022-10-14 ENCOUNTER — Encounter: Payer: Self-pay | Admitting: *Deleted

## 2022-10-14 ENCOUNTER — Ambulatory Visit
Admission: RE | Admit: 2022-10-14 | Discharge: 2022-10-14 | Disposition: A | Discharge status: Home / Self Care | Payer: Medicare Other | Attending: Gastroenterology | Admitting: Gastroenterology

## 2022-10-14 ENCOUNTER — Encounter
Admission: RE | Disposition: A | Discharge status: Home / Self Care | Payer: Self-pay | Source: Home / Self Care | Attending: Gastroenterology

## 2022-10-14 DIAGNOSIS — K5730 Diverticulosis of large intestine without perforation or abscess without bleeding: Secondary | ICD-10-CM | POA: Diagnosis not present

## 2022-10-14 DIAGNOSIS — E785 Hyperlipidemia, unspecified: Secondary | ICD-10-CM | POA: Diagnosis not present

## 2022-10-14 DIAGNOSIS — E1165 Type 2 diabetes mellitus with hyperglycemia: Secondary | ICD-10-CM | POA: Insufficient documentation

## 2022-10-14 DIAGNOSIS — Z86010 Personal history of colonic polyps: Secondary | ICD-10-CM | POA: Insufficient documentation

## 2022-10-14 DIAGNOSIS — D123 Benign neoplasm of transverse colon: Secondary | ICD-10-CM | POA: Diagnosis not present

## 2022-10-14 DIAGNOSIS — M1990 Unspecified osteoarthritis, unspecified site: Secondary | ICD-10-CM | POA: Diagnosis not present

## 2022-10-14 DIAGNOSIS — Z1211 Encounter for screening for malignant neoplasm of colon: Secondary | ICD-10-CM | POA: Diagnosis not present

## 2022-10-14 DIAGNOSIS — I10 Essential (primary) hypertension: Secondary | ICD-10-CM | POA: Insufficient documentation

## 2022-10-14 DIAGNOSIS — E1121 Type 2 diabetes mellitus with diabetic nephropathy: Secondary | ICD-10-CM | POA: Insufficient documentation

## 2022-10-14 DIAGNOSIS — Z800 Family history of malignant neoplasm of digestive organs: Secondary | ICD-10-CM | POA: Insufficient documentation

## 2022-10-14 DIAGNOSIS — Z79899 Other long term (current) drug therapy: Secondary | ICD-10-CM | POA: Diagnosis not present

## 2022-10-14 DIAGNOSIS — D122 Benign neoplasm of ascending colon: Secondary | ICD-10-CM | POA: Insufficient documentation

## 2022-10-14 DIAGNOSIS — K635 Polyp of colon: Secondary | ICD-10-CM | POA: Diagnosis not present

## 2022-10-14 HISTORY — PX: COLONOSCOPY WITH PROPOFOL: SHX5780

## 2022-10-14 LAB — GLUCOSE, CAPILLARY: Glucose-Capillary: 169 mg/dL — ABNORMAL HIGH (ref 70–99)

## 2022-10-14 SURGERY — COLONOSCOPY WITH PROPOFOL
Anesthesia: General

## 2022-10-14 MED ORDER — SODIUM CHLORIDE 0.9 % IV SOLN: INTRAVENOUS | Status: DC

## 2022-10-14 MED ORDER — PROPOFOL 10 MG/ML IV BOLUS
INTRAVENOUS | Status: DC | PRN
  Administered 2022-10-14: 20 mg via INTRAVENOUS
  Administered 2022-10-14: 50 mg via INTRAVENOUS
  Administered 2022-10-14: 20 mg via INTRAVENOUS
  Administered 2022-10-14: 30 mg via INTRAVENOUS

## 2022-10-14 MED ORDER — PROPOFOL 500 MG/50ML IV EMUL
INTRAVENOUS | Status: DC | PRN
  Administered 2022-10-14: 125 ug/kg/min via INTRAVENOUS

## 2022-10-14 NOTE — H&P (Signed)
Outpatient short stay form Pre-procedure 10/14/2022  Lesly Rubenstein, MD  Primary Physician: [name], MD  Reason for visit:  Surveillance  History of present illness:    76 y/o lady with history of hypertension, DM II, and HLD here for surveillance colonoscopy. Had several Ta's on last colonoscopy in 2019. Had complicated course due to pancreatitis in 2021. No blood thinners. History of appendectomy, cholecystectomy, and hysterectomy. Father had colon cancer in his 18's.    Current Facility-Administered Medications:    0.9 %  sodium chloride infusion, , Intravenous, Continuous, Latresha Yahr, Hilton Cork, MD, Last Rate: 20 mL/hr at 10/14/22 1001, New Bag at 10/14/22 1001  Medications Prior to Admission  Medication Sig Dispense Refill Last Dose   acetaminophen (TYLENOL) 500 MG tablet Take 500 mg by mouth 2 (two) times daily.   Past Week   amLODipine (NORVASC) 10 MG tablet TAKE 1 TABLET BY MOUTH DAILY 90 tablet 0 10/13/2022 at 2200   B-D UF III MINI PEN NEEDLES 31G X 5 MM MISC Inject 1 Device into the skin 4 (four) times daily. 400 each 3 10/13/2022   benazepril-hydrochlorthiazide (LOTENSIN HCT) 20-12.5 MG tablet TAKE ONE-HALF TABLET BY MOUTH  DAILY 45 tablet 0 10/14/2022 at 0530   Cholecalciferol (VITAMIN D3) 25 MCG (1000 UT) CAPS Take 2 capsules (2,000 Units total) by mouth daily.   Past Week   Continuous Blood Gluc Sensor (DEXCOM G6 SENSOR) MISC 1 Device by Other route See admin instructions. Change every 10 days 9 each 3 10/14/2022   CRANBERRY PO Take 4,200 mg by mouth 2 (two) times daily.   Past Week   CRANBERRY-VITAMIN C-D MANNOSE PO Take 1,500 mg by mouth daily.   Past Week   CREON 36000-114000 units CPEP capsule TAKE 2 CAPSULES BY MOUTH AS DIRECTED (ONE CAPSULE WITH SNACKS, TWO CAPSULES WITH MEALS) 180 capsule 11 10/13/2022   D-MANNOSE PO Take 2 tablets by mouth daily with lunch.   Past Week   diphenhydrAMINE (BENADRYL) 25 MG tablet Take 25 mg by mouth 2 (two) times daily as  needed for allergies.   10/14/2022 at 0530   estradiol (ESTRACE) 0.1 MG/GM vaginal cream Estrogen Cream Instruction Discard applicator Apply pea sized amount to tip of finger to urethra before bed. Wash hands well after application. Use Monday, Wednesday and Friday 42.5 g 11 10/13/2022   ferrous sulfate 325 (65 FE) MG tablet Take 1 tablet (325 mg total) by mouth daily with breakfast. 90 tablet 3 Past Week   fluticasone (FLONASE) 50 MCG/ACT nasal spray Place 1 spray into both nostrils daily. 16 g 11 91/47/8295   folic acid (FOLVITE) 1 MG tablet TAKE 1 TABLET BY MOUTH DAILY 90 tablet 0 Past Week   Glucagon (BAQSIMI ONE PACK) 3 MG/DOSE POWD Place 3 mg into the nose daily as needed (hypoglycemia).   10/13/2022   glucose blood (ACCU-CHEK AVIVA PLUS) test strip 1 each by Other route 4 (four) times daily. And lancets 4/day 400 each 3 10/13/2022   insulin glargine (LANTUS SOLOSTAR) 100 UNIT/ML Solostar Pen Inject 28 Units into the skin at bedtime. 30 mL 3 10/13/2022   insulin lispro (HUMALOG KWIKPEN) 200 UNIT/ML KwikPen Max daily 100 units 30 mL 3 10/13/2022   loperamide (IMODIUM A-D) 2 MG tablet Take 4 mg by mouth 4 (four) times daily as needed for diarrhea or loose stools.   10/13/2022   meclizine (ANTIVERT) 25 MG tablet Take 1 tablet (25 mg total) by mouth 3 (three) times daily as needed for nausea. [place]  tablet 0 10/13/2022   metoprolol succinate (TOPROL-XL) 50 MG 24 hr tablet TAKE 1 TABLET BY MOUTH DAILY  WITH OR IMMEDIATELY FOLLOWING A  MEAL 90 tablet 0 10/14/2022 at 0530   Multiple Vitamin (MULTIVITAMIN WITH MINERALS) TABS tablet Take 1 tablet by mouth daily.   10/13/2022   pantoprazole (PROTONIX) 40 MG tablet TAKE 1 TABLET BY MOUTH IN THE  EVENING 90 tablet 0 10/13/2022   rosuvastatin (CRESTOR) 10 MG tablet TAKE 1 TABLET BY MOUTH DAILY 90 tablet 0 10/13/2022   amoxicillin (AMOXIL) 500 MG capsule Take 2,000 mg by mouth See admin instructions. 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[place])    DSME at [facility] 01/2016    DKA (diabetic ketoacidoses) 04/22/2020   Frequent epistaxis 05/16/2019   S/p cauterization with resolution 2020   GERD (gastroesophageal reflux disease)    Hepatic steatosis    by abd [place] 05/2012, mild transaminitis - normal iron sat and viral hep panel (2011), stable [place] 2017   History of chicken pox    History of measles    History of recurrent UTIs    on chronic keflex   HLD (hyperlipidemia)    HTN (hypertension)    Hypertensive retinopathy of both eyes, 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   Bulakowski   Kidney cyst, acquired 01/2016   L kidney by US   Kidney stone 01/2016   L kidney by [place]   Lung nodules 11/2013   overall stable on f/u CT 01/2016   Osteopenia 06/2013   mild, forearm T -1.1, hip and spine WNL   Pancreatitis    Polycythemia    mild, stable (2013)   Primary localized osteoarthritis of right knee 01/05/2019   Rosacea    metrogel    Review of systems:  Otherwise negative.    Physical Exam  Gen: Alert, oriented. Appears stated age.  HEENT: PERRLA. Lungs: No respiratory distress CV: RRR Abd: soft, benign, no masses Ext: No edema    Planned procedures: Proceed with colonoscopy. The patient understands the nature of the planned procedure, indications, risks, alternatives and potential complications including but not limited to bleeding, infection, perforation, damage to internal organs and possible  oversedation/side effects from anesthesia. The patient agrees and gives consent to proceed.  Please refer to procedure notes for findings, recommendations and patient disposition/instructions.     Lesly Rubenstein, MD [facility] Gastroenter[facility]

## 2022-10-14 NOTE — Anesthesia Preprocedure Evaluation (Signed)
Anesthesia Evaluation  Patient identified by MRN, date of birth, ID band Patient awake    Reviewed: Allergy & Precautions, NPO status , Patient's Chart, lab work & pertinent test results  History of Anesthesia Complications Negative for: history of anesthetic complications  Airway Mallampati: III  TM Distance: >3 FB Neck ROM: full    Dental  (+) Chipped   Pulmonary neg pulmonary ROS   Pulmonary exam normal        Cardiovascular hypertension, On Medications negative cardio ROS Normal cardiovascular exam     Neuro/Psych negative neurological ROS  negative psych ROS   GI/Hepatic Neg liver ROS, PUD,GERD  ,,  Endo/Other  negative endocrine ROSdiabetes, Poorly Controlled, Type 2    Renal/GU Renal disease  negative genitourinary   Musculoskeletal  (+) Arthritis , Osteoarthritis,    Abdominal   Peds  Hematology negative hematology ROS (+)   Anesthesia Other Findings Past Medical History: No date: Allergic rhinitis No date: Arthritis 08/2014: Breast mass, right     Comment:  biopsy benign - [place] 09/2008: Colon polyp     Comment:  tubulovillous adenoma, 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 No date: 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  nephropathy ([place])     Comment:  DSME at [facility] 01/2016  04/22/2020: DKA (diabetic ketoacidoses) 05/16/2019: Frequent epistaxis     Comment:  S/p cauterization with resolution 2020 No date: GERD (gastroesophageal reflux disease) No date: Hepatic steatosis     Comment:  by abd [place] 05/2012, mild transaminitis - normal iron sat               and viral hep panel (2011), stable [place] 2017 No date: History of chicken pox No date: History of measles No date: History of recurrent UTIs     Comment:  on chronic keflex No date: HLD (hyperlipidemia) No date: HTN (hypertension) 06/2014: Hypertensive retinopathy of both eyes, grade 1     Comment:  Bulakowski 01/2016: Kidney cyst, acquired     Comment:  L  kidney by [place] 01/2016: Kidney stone     Comment:  L kidney by [place] 11/2013: Lung nodules     Comment:  overall stable on f/u CT 01/2016 06/2013: Osteopenia     Comment:  mild, forearm T -1.1, hip and spine WNL No date: Pancreatitis No date: Polycythemia     Comment:  mild, stable (2013) 01/05/2019: Primary localized osteoarthritis of right knee No date: Rosacea     Comment:  metrogel  Past Surgical History: 1987: APPENDECTOMY 12/14/2019: BILIARY STENT PLACEMENT     Comment:  Procedure: BILIARY STENT PLACEMENT;  Surgeon: [name], MD;  Location: [place];  Service:               Endoscopy;; 1963: BREAST BIOPSY; Right     Comment:  benign 08/2014: BREAST BIOPSY; Right     Comment:  benign- core 04/2004: cardiolite stress test     Comment:  normal 2505;3976: CESAREAN SECTION     Comment:  x2 2003: CHOLECYSTECTOMY 09/26/2008: COLONOSCOPY     Comment:  adenomatous polyp, rpt 3-5 yrs 08/2012: COLONOSCOPY     Comment:  adenomatous polyps, diverticulosis, rec rpt 5 yrs               [name]) 02/05/2018: COLONOSCOPY WITH PROPOFOL; N/A     Comment:  4TA, SSA, diverticulosis, rpt 3 yrs ([place], [name],  MD) 2003: dexa     Comment:  normal 06/2013: dexa     Comment:  [facility] - Tscore -1.1 forearm, normal spine and femur 12/14/2019: ERCP; N/A     Comment:  Procedure: ENDOSCOPIC RETROGRADE               CHOLANGIOPANCREATOGRAPHY (ERCP);  Surgeon: [name],              MD;  Location: [place];  Service: Endoscopy;                Laterality: N/A; 01/2020: ERCP     Comment:  nonbleeding gastric ulcer - [facility] hospitalization (Dr               Mont Dutton) 12/19/2019: ESOPHAGOGASTRODUODENOSCOPY; N/A     Comment:  Procedure: ESOPHAGOGASTRODUODENOSCOPY (EGD);  Surgeon:               [name], MD;  Location: [facility] ENDOSCOPY;  Service:               Endoscopy;  Laterality: N/A; 01/2020: ESOPHAGOGASTRODUODENOSCOPY     Comment:  pre existing AXIOS  cystogastrostomy stent s/p               necrosectomy - [facility] hospitalization (Dr Mont Dutton) 04/24/2020: ESOPHAGOGASTRODUODENOSCOPY; N/A     Comment:  Procedure: ESOPHAGOGASTRODUODENOSCOPY (EGD);  Surgeon:               Wonda Horner, MD;  Location: [facility] ENDOSCOPY;  Service:               Endoscopy;  Laterality: N/A; 05/2020: ESOPHAGOGASTRODUODENOSCOPY     Comment:  normal esophagus, duodenum, erythematous mucosa in               gastric body, gastric cystogastrostomy stent removed               ([facility]) 12/27/2019: IR FLUORO GUIDE CV LINE RIGHT 04/24/2020: IR FLUORO GUIDE CV LINE RIGHT 04/26/2020: IR IVC FILTER PLMT / S&I /IMG GUID/MOD SED 07/31/2021: IR IVC FILTER RETRIEVAL / S&I Burke Keels GUID/MOD SED 07/31/2020: IR RADIOLOGIST EVAL & MGMT 01/15/2021: IR RADIOLOGIST EVAL & MGMT 06/19/2021: IR RADIOLOGIST EVAL & MGMT 01/03/2020: IR REMOVAL TUN CV CATH W/O FL 04/29/2020: IR REMOVAL TUN CV CATH W/O FL 04/01/2020: IR REPLC DUODEN/JEJUNO TUBE PERCUT W/FLUORO 04/24/2020: IR US GUIDE VASC ACCESS RIGHT 12/14/2019: SPHINCTEROTOMY     Comment:  Procedure: SPHINCTEROTOMY;  Surgeon: [name], MD;               Location: [place];  Service: Endoscopy;; 01/17/2019: TOTAL KNEE ARTHROPLASTY; Right     Comment:  Procedure: TOTAL KNEE ARTHROPLASTY;  Surgeon: [name], MD;  Location: [facility];  Service: Orthopedics;                Laterality: Right; 04/2019: TRANSTHORACIC ECHOCARDIOGRAM     Comment:  EF 55-60%, modLVH, impaired relaxation  2007;2010;2011: TRIGGER FINGER RELEASE     Comment:  bilateral 02/2017: TRIGGER FINGER RELEASE 1984: VAGINAL HYSTERECTOMY     Comment:  for menorrhagia, ovaries in place     Reproductive/Obstetrics negative OB ROS                             Anesthesia Physical Anesthesia Plan  ASA: 3  Anesthesia Plan: General   Post-op Pain Management: Minimal or no pain anticipated  Induction: Intravenous  PONV Risk Score and Plan:  Propofol infusion and TIVA  Airway Management Planned: Natural Airway and Nasal Cannula  Additional Equipment:   Intra-op Plan:   Post-operative Plan:   Informed Consent: I have reviewed the patients History and Physical, chart, labs and discussed the procedure including the risks, benefits and alternatives for the proposed anesthesia with the patient or authorized representative who has indicated his/her understanding and acceptance.     Dental Advisory Given  Plan Discussed with: Anesthesiologist, CRNA and Surgeon  Anesthesia Plan Comments: (Patient consented for risks of anesthesia including but not limited to:  - adverse reactions to medications - risk of airway placement if required - damage to eyes, teeth, lips or other oral mucosa - nerve damage due to positioning  - sore throat or hoarseness - Damage to heart, brain, nerves, lungs, other parts of body or loss of life  Patient voiced understanding.)       Anesthesia Quick Evaluation

## 2022-10-14 NOTE — Op Note (Signed)
[facility] Gastroenterology Patient Name: Katherine Oliver Procedure Date: 10/14/2022 10:24 AM MRN: [id number] Account #: [id number] Date of Birth: 05-09-1946 Admit Type: Outpatient Age: 76 Room: [facility] ENDO ROOM 1 Gender: Female Note Status: Finalized Instrument Name: Jasper Riling [id number] Procedure:             Colonoscopy Indications:           Surveillance: Personal history of adenomatous polyps                         on last colonoscopy > 3 years ago Providers:             Andrey Farmer MD, MD Referring MD:          [name] (Referring MD) Medicines:             Monitored Anesthesia Care Complications:         No immediate complications. Estimated blood loss:                         Minimal. Procedure:             Pre-Anesthesia Assessment:                        - Prior to the procedure, a History and Physical was                         performed, and patient medications and allergies were                         reviewed. The patient is competent. The risks and                         benefits of the procedure and the sedation options and                         risks were discussed with the patient. All questions                         were answered and informed consent was obtained.                         Patient identification and proposed procedure were                         verified by the physician, the nurse, the                         anesthesiologist, the anesthetist and the technician                         in the endoscopy suite. Mental Status Examination:                         alert and oriented. Airway Examination: normal                         oropharyngeal airway and neck mobility. Respiratory  Examination: clear to auscultation. CV Examination:                         normal. Prophylactic Antibiotics: The patient does not                         require prophylactic antibiotics. Prior                          Anticoagulants: The patient has taken no anticoagulant                         or antiplatelet agents. ASA Grade Assessment: III - A                         patient with severe systemic disease. After reviewing                         the risks and benefits, the patient was deemed in                         satisfactory condition to undergo the procedure. The                         anesthesia plan was to use monitored anesthesia care                         (MAC). Immediately prior to administration of                         medications, the patient was re-assessed for adequacy                         to receive sedatives. The heart rate, respiratory                         rate, oxygen saturations, blood pressure, adequacy of                         pulmonary ventilation, and response to care were                         monitored throughout the procedure. The physical                         status of the patient was re-assessed after the                         procedure.                        After obtaining informed consent, the colonoscope was                         passed under direct vision. Throughout the procedure,                         the patient's blood pressure, pulse, and oxygen  saturations were monitored continuously. The                         Colonoscope was introduced through the anus and                         advanced to the the cecum, identified by appendiceal                         orifice and ileocecal valve. The colonoscopy was                         somewhat difficult due to significant looping. The                         patient tolerated the procedure well. The quality of                         the bowel preparation was fair. The ileocecal valve,                         appendiceal orifice, and rectum were photographed. Findings:      The perianal and digital rectal examinations were normal.      A 10 mm polyp was  found in the ascending colon. The polyp was       semi-pedunculated. The polyp was removed with a cold snare. Resection       and retrieval were complete. To prevent bleeding post-intervention, two       hemostatic clips were successfully placed. There was no bleeding at the       end of the procedure.      A 3 mm polyp was found in the transverse colon. The polyp was sessile.       The polyp was removed with a cold snare. Resection and retrieval were       complete. Estimated blood loss was minimal.      Multiple small-mouthed diverticula were found in the sigmoid colon.      The exam was otherwise without abnormality on direct and retroflexion       views. Impression:            - Preparation of the colon was fair.                        - One 10 mm polyp in the ascending colon, removed with                         a cold snare. Resected and retrieved. Clips were                         placed.                        - One 3 mm polyp in the transverse colon, removed with                         a cold snare. Resected and retrieved.                        - Diverticulosis in  the sigmoid colon.                        - The examination was otherwise normal on direct and                         retroflexion views. Recommendation:        - Discharge patient to home.                        - Resume previous diet.                        - Continue present medications.                        - Await pathology results.                        - Repeat colonoscopy for surveillance based on                         pathology results. Will need to take in account age,                         fair prep, and history of multiple polyps. Could                         consider repeat in 1-2 years given fair prep.                        - Return to referring physician as previously                         scheduled. Procedure Code(s):     --- Professional ---                        [phone], Colonoscopy,  flexible; with removal of                         tumor(s), polyp(s), or other lesion(s) by snare                         technique Diagnosis Code(s):     --- Professional ---                        Z86.010, Personal history of colonic polyps                        D12.2, Benign neoplasm of ascending colon                        D12.3, Benign neoplasm of transverse colon (hepatic                         flexure or splenic flexure)                        K57.30, Diverticulosis of large intestine without  perforation or abscess without bleeding CPT copyright 2022 American Medical Association. All rights reserved. The codes documented in this report are preliminary and upon coder review may  be revised to meet current compliance requirements. Andrey Farmer MD, MD 10/14/2022 11:06:22 AM Number of Addenda: 0 Note Initiated On: 10/14/2022 10:24 AM Scope Withdrawal Time: 0 hours 11 minutes 7 seconds  Total Procedure Duration: 0 hours 17 minutes 52 seconds  Estimated Blood Loss:  Estimated blood loss was minimal.      [place]

## 2022-10-14 NOTE — Anesthesia Postprocedure Evaluation (Signed)
Anesthesia Post Note  Patient: Katherine Oliver  Procedure(s) Performed: COLONOSCOPY WITH PROPOFOL  Patient location during evaluation: Endoscopy Anesthesia Type: General Level of consciousness: awake and alert Pain management: pain level controlled Vital Signs Assessment: post-procedure vital signs reviewed and stable Respiratory status: spontaneous breathing, nonlabored ventilation, respiratory function stable and patient connected to nasal cannula oxygen Cardiovascular status: blood pressure returned to baseline and stable Postop Assessment: no apparent nausea or vomiting Anesthetic complications: no  No notable events documented.   Last Vitals:  Vitals:   10/14/22 1114 10/14/22 1124  BP: 117/76 128/76  Pulse: 74 72  Resp: (!) 24 20  Temp:    SpO2: 99% 100%    Last Pain:  Vitals:   10/14/22 1124  TempSrc:   PainSc: 0-No pain                 Ilene Qua

## 2022-10-14 NOTE — Interval H&P Note (Signed)
History and Physical Interval Note:  10/14/2022 10:35 AM  Gilmore Laroche Hession  has presented today for surgery, with the diagnosis of HX OF ADENOMATOUS POLYP OF COLON.  The various methods of treatment have been discussed with the patient and family. After consideration of risks, benefits and other options for treatment, the patient has consented to  Procedure(s): COLONOSCOPY WITH PROPOFOL (N/A) as a surgical intervention.  The patient's history has been reviewed, patient examined, no change in status, stable for surgery.  I have reviewed the patient's chart and labs.  Questions were answered to the patient's satisfaction.     Lesly Rubenstein  Ok to proceed with colonoscopy

## 2022-10-14 NOTE — Transfer of Care (Signed)
Immediate Anesthesia Transfer of Care Note  Patient: Katherine Oliver  Procedure(s) Performed: COLONOSCOPY WITH PROPOFOL  Patient Location: PACU  Anesthesia Type:General  Level of Consciousness: drowsy  Airway & Oxygen Therapy: Patient Spontanous Breathing and Patient connected to nasal cannula oxygen  Post-op Assessment: Report given to RN and Post -op Vital signs reviewed and stable  Post vital signs: Reviewed and stable  Last Vitals:  Vitals Value Taken Time  BP 127/76 10/14/22 1104  Temp 97   Pulse 73 10/14/22 1105  Resp 19 10/14/22 1105  SpO2 96 % 10/14/22 1105  Vitals shown include unvalidated device data.  Last Pain:  Vitals:   10/14/22 0947  TempSrc: Temporal  PainSc: 0-No pain         Complications: No notable events documented.

## 2022-10-15 ENCOUNTER — Encounter: Payer: Self-pay | Admitting: Gastroenterology

## 2022-10-15 LAB — SURGICAL PATHOLOGY

## 2022-11-03 ENCOUNTER — Other Ambulatory Visit: Payer: Self-pay | Admitting: Family Medicine

## 2022-11-03 DIAGNOSIS — E1169 Type 2 diabetes mellitus with other specified complication: Secondary | ICD-10-CM

## 2022-11-03 DIAGNOSIS — Z794 Long term (current) use of insulin: Secondary | ICD-10-CM

## 2022-11-03 DIAGNOSIS — E1122 Type 2 diabetes mellitus with diabetic chronic kidney disease: Secondary | ICD-10-CM

## 2022-11-03 DIAGNOSIS — M85852 Other specified disorders of bone density and structure, left thigh: Secondary | ICD-10-CM

## 2022-11-03 DIAGNOSIS — D509 Iron deficiency anemia, unspecified: Secondary | ICD-10-CM

## 2022-11-05 ENCOUNTER — Other Ambulatory Visit: Payer: Medicare Other

## 2022-11-08 ENCOUNTER — Emergency Department: Payer: Medicare Other

## 2022-11-08 ENCOUNTER — Other Ambulatory Visit: Payer: Self-pay

## 2022-11-08 ENCOUNTER — Emergency Department
Admission: EM | Admit: 2022-11-08 | Discharge: 2022-11-08 | Disposition: A | Discharge status: Home / Self Care | Payer: Medicare Other | Attending: Emergency Medicine | Admitting: Emergency Medicine

## 2022-11-08 DIAGNOSIS — N183 Chronic kidney disease, stage 3 unspecified: Secondary | ICD-10-CM | POA: Insufficient documentation

## 2022-11-08 DIAGNOSIS — R059 Cough, unspecified: Secondary | ICD-10-CM | POA: Diagnosis not present

## 2022-11-08 DIAGNOSIS — E1122 Type 2 diabetes mellitus with diabetic chronic kidney disease: Secondary | ICD-10-CM | POA: Diagnosis not present

## 2022-11-08 DIAGNOSIS — R509 Fever, unspecified: Secondary | ICD-10-CM | POA: Diagnosis not present

## 2022-11-08 DIAGNOSIS — I129 Hypertensive chronic kidney disease with stage 1 through stage 4 chronic kidney disease, or unspecified chronic kidney disease: Secondary | ICD-10-CM | POA: Insufficient documentation

## 2022-11-08 DIAGNOSIS — U071 COVID-19: Secondary | ICD-10-CM | POA: Insufficient documentation

## 2022-11-08 DIAGNOSIS — J029 Acute pharyngitis, unspecified: Secondary | ICD-10-CM | POA: Diagnosis not present

## 2022-11-08 LAB — RESP PANEL BY RT-PCR (RSV, FLU A&B, COVID)  RVPGX2
Influenza A by PCR: NEGATIVE
Influenza B by PCR: NEGATIVE
Resp Syncytial Virus by PCR: NEGATIVE
SARS Coronavirus 2 by RT PCR: POSITIVE — AB

## 2022-11-08 NOTE — ED Triage Notes (Signed)
Pt to ED via POV from home. Pt reports fever, sore throat, and coughing x1 wk. Pt reports exposure to COVID.

## 2022-11-08 NOTE — Discharge Instructions (Signed)
Your swab is positive for COVID-19.  Your chest x-ray does not show evidence of pneumonia.  Please continue to take Tylenol/ibuprofen per package instructions to help with your symptoms.  Please return for any new, worsening, or change in symptoms or other concerns.

## 2022-11-08 NOTE — ED Provider Notes (Signed)
[facility] Provider Note    Event Date/Time   First MD Initiated Contact with Patient 11/08/22 1247     (approximate)   History   Cough   HPI  Katherine Oliver is a 77 y.o. female with a past medical history of type 2 diabetes, hypertension, dyslipidemia who presents today for evaluation of fever, sore throat, and cough for the past 1 week.  She reports that she has been exposed to COVID-19.  Her husband is also sick with the same symptoms.  She reports that she has had a fever but she is unsure how high.  Denies chest pain or shortness of breath.  Patient Active Problem List   Diagnosis Date Noted   Weight gain 07/01/2022   Pancreatic insufficiency 10/04/2021   Entropion of both lower eyelids 04/03/2021   Pain involving joint of finger of left hand 10/02/2020   Clostridioides difficile infection 06/05/2020   Hair loss 06/05/2020   Right femoral vein DVT ([place]) 05/03/2020   Hx of epistaxis    Aortic atherosclerosis ([place])    Diabetes mellitus type 2, insulin dependent ([place]) 04/17/2020   Pancreatitis 02/10/2020   HTN (hypertension) 01/15/2020   Iron deficiency anemia 01/15/2020   AF (paroxysmal atrial fibrillation) ([place]) 01/15/2020   Pancreatic pseudocyst 01/15/2020   Chronic pancreatitis ([place]) 01/12/2020   Acquired renal cyst of left kidney 01/06/2020   Gastric stress ulcer 01/06/2020   Necrotizing pancreatitis    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[place])    History of pancreatitis 12/13/2019   Chronic diarrhea 06/01/2019   Primary localized osteoarthritis of right knee 01/05/2019   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   Hx of adenomatous polyp of colon 12/15/2017   Vulvar dermatitis 12/07/2017   Right hip pain 08/07/2016   CKD stage 3 due to type 2 diabetes mellitus ([place]) 04/04/2016   Pulmonary nodules 01/03/2016   Advanced care planning/counseling discussion 07/05/2015   Obesity, Class I, BMI 30-34.9 07/05/2015   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   Osteopenia 06/03/2013   Medicare annual wellness visit, subsequent 05/31/2012   Rotator cuff tendonitis, right 03/01/2012   Polycythemia 12/02/2011   Controlled diabetes mellitus type 2 with complications ([place])    Accelerated hypertension    Dyslipidemia associated with type 2 diabetes mellitus ([place])    History of recurrent UTIs    Allergic rhinitis    GERD (gastroesophageal reflux disease)    Rosacea    NAFLD (nonalcoholic fatty liver disease) 06/03/2010          Physical Exam   Triage Vital Signs: ED Triage Vitals  Enc Vitals Group     BP 11/08/22 1229 (!) 151/83     Pulse Rate 11/08/22 1229 85     Resp 11/08/22 1229 18     Temp 11/08/22 1229 98 F (36.7 C)     Temp src --      SpO2 11/08/22 1229 94 %     Weight --      Height --      Head Circumference --      Peak Flow --      Pain Score 11/08/22 1226 0     Pain Loc --      Pain Edu? --      Excl. in [place]? --     Most recent vital signs: Vitals:   11/08/22 1229 11/08/22 1352  BP: (!) 151/83   Pulse: 85   Resp: 18   Temp: 98 F (36.7  C)   SpO2: 94% 95%    Physical Exam Vitals and nursing note reviewed.  Constitutional:      General: Awake and alert. No acute distress.    Appearance: Normal appearance. The patient is normal weight.  HENT:     Head: Normocephalic and atraumatic.     Mouth: Mucous membranes are moist.  Eyes:     General: PERRL. Normal EOMs        Right eye: No discharge.        Left eye: No discharge.     Conjunctiva/sclera: Conjunctivae normal.  Cardiovascular:     Rate and Rhythm: Normal rate      Pulses: Normal pulses.  Pulmonary:     Effort: Pulmonary effort is normal. No respiratory distress.     Breath sounds: Normal breath sounds.  Able to speak easily in complete sentences Abdominal:     Abdomen is soft. There is no abdominal tenderness. No rebound or guarding. No distention. Musculoskeletal:        General: No swelling. Normal  range of motion.     Cervical back: Normal range of motion and neck supple.  Skin:    General: Skin is warm and dry.     Capillary Refill: Capillary refill takes less than 2 seconds.     Findings: No rash.  Neurological:     Mental Status: The patient is awake and alert.      ED Results / Procedures / Treatments   Labs (all labs ordered are listed, but only abnormal results are displayed) Labs Reviewed  RESP PANEL BY RT-PCR (RSV, FLU A&B, COVID)  [id number] - Abnormal; Notable for the following components:      Result Value   SARS Coronavirus 2 by RT PCR POSITIVE (*)    All other components within normal limits     EKG     RADIOLOGY I independently reviewed and interpreted imaging and agree with radiologists findings.     PROCEDURES:  Critical Care performed:   Procedures   MEDICATIONS ORDERED IN ED: Medications - No data to display   IMPRESSION / MDM / [place] / ED COURSE  I reviewed the triage vital signs and the nursing notes.   Differential diagnosis includes, but is not limited to, COVID-19, influenza, RSV, bronchitis, pneumonia.  Patient presents emergency department awake and alert, hemodynamically stable and afebrile.  She has a normal oxygen saturation on room air and demonstrates no increased work of breathing.  She is able to speak easily in complete sentences.  Swab obtained in triage is positive for COVID-19.  Discussed these findings with the patient and her husband who is also positive for COVID-19.  She is out of the window for treatment with Paxlovid.  She does not require hospitalization given her normal vital signs.  We discussed return precautions and the importance of close outpatient follow-up.  Patient understands and agrees with plan.  She was discharged in stable condition.   Patient's presentation is most consistent with acute complicated illness / injury requiring diagnostic workup.      FINAL CLINICAL IMPRESSION(S) / ED  DIAGNOSES   Final diagnoses:  [id number]     Rx / DC Orders   ED Discharge Orders     None        Note:  This document was prepared using Dragon voice recognition software and may include unintentional dictation errors.   Emeline Gins 11/08/22 1406    Lavonia Drafts, MD 11/08/22 1450

## 2022-11-10 ENCOUNTER — Ambulatory Visit (INDEPENDENT_AMBULATORY_CARE_PROVIDER_SITE_OTHER): Payer: Medicare Other | Admitting: Family Medicine

## 2022-11-10 ENCOUNTER — Encounter: Payer: Self-pay | Admitting: Family Medicine

## 2022-11-10 VITALS — BP 142/83 | HR 72 | Temp 97.9°F | Ht 61.0 in | Wt 159.6 lb

## 2022-11-10 DIAGNOSIS — U071 COVID-19: Secondary | ICD-10-CM

## 2022-11-10 DIAGNOSIS — I10 Essential (primary) hypertension: Secondary | ICD-10-CM

## 2022-11-10 DIAGNOSIS — Z794 Long term (current) use of insulin: Secondary | ICD-10-CM

## 2022-11-10 DIAGNOSIS — E118 Type 2 diabetes mellitus with unspecified complications: Secondary | ICD-10-CM | POA: Diagnosis not present

## 2022-11-10 HISTORY — DX: COVID-19: U07.1

## 2022-11-10 NOTE — Assessment & Plan Note (Signed)
BP stable on current regimen.  

## 2022-11-10 NOTE — Assessment & Plan Note (Signed)
Recent hypoglycemia due to poor appetite around COVID - discussed dropping lantus dosing until feeling better.

## 2022-11-10 NOTE — Assessment & Plan Note (Signed)
Discussed recent infection as well as treatment plan. Outside of window for Paxlovid.  Reviewed expected course of illness, anticipated course of recovery, as well as red flags to suggest COVID pneumonia and/or to seek urgent in-person care. Reviewed CDC isolation/quarantine guidelines.  Encouraged fluids and rest. Reviewed further supportive care measures at home including vit C '500mg'$  bid, vit D 2000 IU daily, zinc '100mg'$  daily, tylenol PRN, pepcid '20mg'$  BID PRN.

## 2022-11-10 NOTE — Progress Notes (Signed)
   Katherine Oliver - 77 y.o. female  MRN [id number]  Date of Birth: 10-14-1946  PCP: [name], MD  This service was provided via telemedicine. Phone Visit performed on 11/10/2022    Rationale for phone visit along with limitations reviewed. I discussed the limitations, risks, security and privacy concerns of performing a phone visit and the availability of in person appointments. I also discussed with the patient that there may be a patient responsible charge related to this service. Patient consented to telephone encounter.    Location of patient: home Location of provider: office [place] @ [facility] Name of referring provider: N/A   Names of persons and role in encounter: Provider: [name], MD  Patient: Katherine Oliver  Other: husband [name]   Time on call: 12:19pm - 12:38pm   Subjective: Chief Complaint  Patient presents with   Covid Positive    C/o fever- max 100.1, runny nose, ST and fatigue. Seen on 11/08/22 at [facility] ED, tested pos for Covid. Not prescribed antiviral. Told to continue Tylenol, Benadryl and Mucinex.      HPI:  Recent [facility] ER visit for COVID infection. Records reviewed. Symptoms developed ~11/01/2022. Symptoms include cough, fever, sore throat. Cough largely dry. Husband also sick at home.  Treating with tylenol, benadryl, mucinex, fluids and rest. They also continue zinc, vitamin C and D. Has been taking meclizine.   Sugars are dropping since she's not been eating as much. Suggested she drop Lantus from 28u daily to 20u while acutely ill.   She notes husband is having visual hallucinations.    Objective/Observations:  No physical exam or vital signs collected unless specifically identified below.   BP (!) 142/83   Pulse 72   Temp 97.9 F (36.6 C)   Ht '5\' 1"'$  (1.549 m)   Wt 159 lb 9 oz (72.4 kg)   BMI 30.15 kg/m    Respiratory status: speaks in complete sentences without evident shortness of breath.    Assessment/Plan:  COVID-19 virus infection Discussed recent infection as well as treatment plan. Outside of window for Paxlovid.  Reviewed expected course of illness, anticipated course of recovery, as well as red flags to suggest COVID pneumonia and/or to seek urgent in-person care. Reviewed CDC isolation/quarantine guidelines.  Encouraged fluids and rest. Reviewed further supportive care measures at home including vit C '500mg'$  bid, vit D 2000 IU daily, zinc '100mg'$  daily, tylenol PRN, pepcid '20mg'$  BID PRN.    Controlled diabetes mellitus type 2 with complications (HCC) Recent hypoglycemia due to poor appetite around COVID - discussed dropping lantus dosing until feeling better.   Accelerated hypertension BP stable on current regimen.    I discussed the assessment and treatment plan with the patient. The patient was provided an opportunity to ask questions and all were answered. The patient agreed with the plan and demonstrated an understanding of the instructions.  Lab Orders  No laboratory test(s) ordered today    No orders of the defined types were placed in this encounter.   The patient was advised to call back or seek an in-person evaluation if the symptoms worsen or if the condition fails to improve as anticipated.  [name], MD

## 2022-11-12 ENCOUNTER — Encounter: Payer: Medicare Other | Admitting: Family Medicine

## 2022-11-13 ENCOUNTER — Other Ambulatory Visit: Payer: Self-pay | Admitting: Family Medicine

## 2022-11-13 ENCOUNTER — Telehealth: Payer: Self-pay

## 2022-11-13 DIAGNOSIS — I10 Essential (primary) hypertension: Secondary | ICD-10-CM

## 2022-11-13 DIAGNOSIS — E1169 Type 2 diabetes mellitus with other specified complication: Secondary | ICD-10-CM

## 2022-11-13 NOTE — Telephone Encounter (Signed)
        Patient  visited [facility] on 11/08/2022  for COVID-19, cough.   Telephone encounter attempt :  1st  A HIPAA compliant voice message was left requesting a return call.  Instructed patient to call back at Unable to leave message busy/no answer.   [place] Resource Care Guide   ??[email or web address]  ?? [phone]   Website: [email or web address]  [email or web address]

## 2022-11-17 ENCOUNTER — Encounter: Payer: Self-pay | Admitting: Family Medicine

## 2022-11-17 ENCOUNTER — Ambulatory Visit (INDEPENDENT_AMBULATORY_CARE_PROVIDER_SITE_OTHER): Payer: Medicare Other | Admitting: Family Medicine

## 2022-11-17 VITALS — BP 124/82 | HR 70 | Temp 97.7°F | Ht 60.0 in | Wt 163.2 lb

## 2022-11-17 DIAGNOSIS — E785 Hyperlipidemia, unspecified: Secondary | ICD-10-CM | POA: Diagnosis not present

## 2022-11-17 DIAGNOSIS — Z87440 Personal history of urinary (tract) infections: Secondary | ICD-10-CM

## 2022-11-17 DIAGNOSIS — K529 Noninfective gastroenteritis and colitis, unspecified: Secondary | ICD-10-CM

## 2022-11-17 DIAGNOSIS — M85852 Other specified disorders of bone density and structure, left thigh: Secondary | ICD-10-CM | POA: Diagnosis not present

## 2022-11-17 DIAGNOSIS — N183 Chronic kidney disease, stage 3 unspecified: Secondary | ICD-10-CM

## 2022-11-17 DIAGNOSIS — Z7189 Other specified counseling: Secondary | ICD-10-CM

## 2022-11-17 DIAGNOSIS — K8689 Other specified diseases of pancreas: Secondary | ICD-10-CM

## 2022-11-17 DIAGNOSIS — D509 Iron deficiency anemia, unspecified: Secondary | ICD-10-CM

## 2022-11-17 DIAGNOSIS — I10 Essential (primary) hypertension: Secondary | ICD-10-CM

## 2022-11-17 DIAGNOSIS — I700 Atherosclerosis of aorta: Secondary | ICD-10-CM

## 2022-11-17 DIAGNOSIS — E1169 Type 2 diabetes mellitus with other specified complication: Secondary | ICD-10-CM | POA: Diagnosis not present

## 2022-11-17 DIAGNOSIS — K861 Other chronic pancreatitis: Secondary | ICD-10-CM | POA: Diagnosis not present

## 2022-11-17 DIAGNOSIS — Z794 Long term (current) use of insulin: Secondary | ICD-10-CM

## 2022-11-17 DIAGNOSIS — E1122 Type 2 diabetes mellitus with diabetic chronic kidney disease: Secondary | ICD-10-CM | POA: Diagnosis not present

## 2022-11-17 DIAGNOSIS — K219 Gastro-esophageal reflux disease without esophagitis: Secondary | ICD-10-CM

## 2022-11-17 DIAGNOSIS — E118 Type 2 diabetes mellitus with unspecified complications: Secondary | ICD-10-CM | POA: Diagnosis not present

## 2022-11-17 DIAGNOSIS — K8591 Acute pancreatitis with uninfected necrosis, unspecified: Secondary | ICD-10-CM

## 2022-11-17 DIAGNOSIS — U071 COVID-19: Secondary | ICD-10-CM | POA: Diagnosis not present

## 2022-11-17 DIAGNOSIS — E669 Obesity, unspecified: Secondary | ICD-10-CM

## 2022-11-17 LAB — CBC WITH DIFFERENTIAL/PLATELET
Basophils Absolute: 0 10*3/uL (ref 0.0–0.1)
Basophils Relative: 0.6 % (ref 0.0–3.0)
Eosinophils Absolute: 0 10*3/uL (ref 0.0–0.7)
Eosinophils Relative: 0 % (ref 0.0–5.0)
HCT: 42.7 % (ref 36.0–46.0)
Hemoglobin: 14 g/dL (ref 12.0–15.0)
Lymphocytes Relative: 18.8 % (ref 12.0–46.0)
Lymphs Abs: 1.5 10*3/uL (ref 0.7–4.0)
MCHC: 32.7 g/dL (ref 30.0–36.0)
MCV: 81.7 fl (ref 78.0–100.0)
Monocytes Absolute: 0.7 10*3/uL (ref 0.1–1.0)
Monocytes Relative: 8.7 % (ref 3.0–12.0)
Neutro Abs: 5.6 10*3/uL (ref 1.4–7.7)
Neutrophils Relative %: 71.9 % (ref 43.0–77.0)
Platelets: 161 10*3/uL (ref 150.0–400.0)
RBC: 5.22 Mil/uL — ABNORMAL HIGH (ref 3.87–5.11)
RDW: 15.3 % (ref 11.5–15.5)
WBC: 7.8 10*3/uL (ref 4.0–10.5)

## 2022-11-17 LAB — MICROALBUMIN / CREATININE URINE RATIO
Creatinine,U: 103.5 mg/dL
Microalb Creat Ratio: 32.4 mg/g — ABNORMAL HIGH (ref 0.0–30.0)
Microalb, Ur: 33.5 mg/dL — ABNORMAL HIGH (ref 0.0–1.9)

## 2022-11-17 LAB — COMPREHENSIVE METABOLIC PANEL
ALT: 18 U/L (ref 0–35)
AST: 16 U/L (ref 0–37)
Albumin: 4.7 g/dL (ref 3.5–5.2)
Alkaline Phosphatase: 74 U/L (ref 39–117)
BUN: 33 mg/dL — ABNORMAL HIGH (ref 6–23)
CO2: 25 mEq/L (ref 19–32)
Calcium: 9.6 mg/dL (ref 8.4–10.5)
Chloride: 105 mEq/L (ref 96–112)
Creatinine, Ser: 1.37 mg/dL — ABNORMAL HIGH (ref 0.40–1.20)
GFR: 37.05 mL/min — ABNORMAL LOW (ref >60.00)
Glucose, Bld: 110 mg/dL — ABNORMAL HIGH (ref 70–99)
Potassium: 4.9 mEq/L (ref 3.5–5.1)
Sodium: 140 mEq/L (ref 135–145)
Total Bilirubin: 0.5 mg/dL (ref 0.2–1.2)
Total Protein: 7 g/dL (ref 6.0–8.3)

## 2022-11-17 LAB — LDL CHOLESTEROL, DIRECT: Direct LDL: 63 mg/dL

## 2022-11-17 LAB — LIPID PANEL
Cholesterol: 162 mg/dL (ref 0–200)
HDL: 43.6 mg/dL (ref >39.00)
NonHDL: 118.5
Total CHOL/HDL Ratio: 4
Triglycerides: 343 mg/dL — ABNORMAL HIGH (ref 0.0–149.0)
VLDL: 68.6 mg/dL — ABNORMAL HIGH (ref 0.0–40.0)

## 2022-11-17 LAB — IBC PANEL
Iron: 57 ug/dL (ref 42–145)
Saturation Ratios: 19.4 % — ABNORMAL LOW (ref 20.0–50.0)
TIBC: 294 ug/dL (ref 250.0–450.0)
Transferrin: 210 mg/dL — ABNORMAL LOW (ref 212.0–360.0)

## 2022-11-17 LAB — HEMOGLOBIN A1C: Hgb A1c MFr Bld: 7.6 % — ABNORMAL HIGH (ref 4.6–6.5)

## 2022-11-17 LAB — VITAMIN D 25 HYDROXY (VIT D DEFICIENCY, FRACTURES): VITD: 33.25 ng/mL (ref 30.00–100.00)

## 2022-11-17 LAB — FERRITIN: Ferritin: 433.1 ng/mL — ABNORMAL HIGH (ref 10.0–291.0)

## 2022-11-17 LAB — PHOSPHORUS: Phosphorus: 3.6 mg/dL (ref 2.3–4.6)

## 2022-11-17 MED ORDER — PANTOPRAZOLE SODIUM 40 MG PO TBEC: 40.0000 mg | DELAYED_RELEASE_TABLET | Freq: Every evening | ORAL | 4 refills | Status: DC

## 2022-11-17 MED ORDER — FLUTICASONE PROPIONATE 50 MCG/ACT NA SUSP: 1.0000 | Freq: Every day | NASAL | 11 refills | Status: DC

## 2022-11-17 MED ORDER — FERROUS SULFATE 325 (65 FE) MG PO TABS: 325.0000 mg | ORAL_TABLET | Freq: Every day | ORAL | 4 refills | Status: DC

## 2022-11-17 MED ORDER — FOLIC ACID 1 MG PO TABS: 1.0000 mg | ORAL_TABLET | Freq: Every day | ORAL | 4 refills | Status: DC

## 2022-11-17 MED ORDER — TRIAMCINOLONE ACETONIDE 0.1 % MT PSTE: PASTE | OROMUCOSAL | 1 refills | Status: DC

## 2022-11-17 MED ORDER — ROSUVASTATIN CALCIUM 10 MG PO TABS: 10.0000 mg | ORAL_TABLET | Freq: Every day | ORAL | 4 refills | Status: DC

## 2022-11-17 MED ORDER — METOPROLOL SUCCINATE ER 50 MG PO TB24: ORAL_TABLET | ORAL | 4 refills | Status: DC

## 2022-11-17 MED ORDER — AMLODIPINE BESYLATE 10 MG PO TABS: 10.0000 mg | ORAL_TABLET | Freq: Every day | ORAL | 4 refills | Status: DC

## 2022-11-17 MED ORDER — BENAZEPRIL-HYDROCHLOROTHIAZIDE 20-12.5 MG PO TABS: 0.5000 | ORAL_TABLET | Freq: Every day | ORAL | 4 refills | Status: DC

## 2022-11-17 MED ORDER — MECLIZINE HCL 25 MG PO TABS: 25.0000 mg | ORAL_TABLET | Freq: Three times a day (TID) | ORAL | 0 refills | Status: DC | PRN

## 2022-11-17 NOTE — Assessment & Plan Note (Signed)
Continue oral iron daily

## 2022-11-17 NOTE — Assessment & Plan Note (Signed)
Continues creon through endo.

## 2022-11-17 NOTE — Assessment & Plan Note (Signed)
Pancreatic insufficiency related - continues creon

## 2022-11-17 NOTE — Assessment & Plan Note (Signed)
Continue crestor 

## 2022-11-17 NOTE — Assessment & Plan Note (Signed)
Chronic, stable. Continue current regimen.

## 2022-11-17 NOTE — Assessment & Plan Note (Addendum)
Per latest DEXA osteopenia, not at increased fracture risk.  Continue vit D and good dietary calcium intake.

## 2022-11-17 NOTE — Progress Notes (Signed)
Patient ID: Katherine Oliver, female    DOB: 10-Oct-1946, 77 y.o.   MRN: [id number]  This visit was conducted in person.  BP 124/82   Pulse 70   Temp 97.7 F (36.5 C) (Temporal)   Ht 5' (1.524 m)   Wt 163 lb 4 oz (74 kg)   SpO2 96%   BMI 31.88 kg/m    CC: CPE Subjective:   HPI: Katherine Oliver is a 77 y.o. female presenting on 11/17/2022 for Annual Exam [facility] prt 2 [AWV 10/08/22]. Wants to discuss pantoprazole. C/o mouth sore. Requests refill for triamcinolone paste. Pt accompanied by son, Marcello Moores. )   Saw health advisor 10/2022 for medicare wellness visit. Note reviewed.    No results found.  [place] Office Visit from 11/17/2022 in [place] at [facility]  PHQ-2 Total Score 0          11/17/2022   12:01 PM 10/08/2022    3:40 PM 10/03/2021    2:08 PM 07/09/2021    1:24 PM 09/25/2020   11:34 AM  Fall Risk   Falls in the past year? 0 0 0 0 1  Number falls in past yr:  0 0 0 1  Injury with Fall?  0 0  0  Risk for fall due to :  No Fall Risks No Fall Risks  Medication side effect  Follow up  Falls prevention discussed;Falls evaluation completed Falls prevention discussed  Falls evaluation completed;Falls prevention discussed   Recent COVID infection earlier this month, was outside of window for Paxlovid when evaluated virtually. Symptoms have continued improving.   Prolonged hospitalization for complicated necrotizing pancreatitis with residual chronic diarrhea (pos fecal fat, markedly low fecal elastase, C diff returned positive as well, completed 10d oral vanc 05/2020). Since then on creon 72000u TIDAC through endo. H/o lone afib once during prolonged hospitalization, no trouble since.    DM - sees [facility]o Dr Kelton Pillar, upcoming appt Friday. Continues using Dexcom D6 continuous glucose monitor. Also has CKD. Currently on humalog 16/16/20u TID AC + SSI (2u for every 50 over 200) and lantus 28u nightly.    Recurrent UTI - sees urology, taking d  mannitose and cranberry tablets daily preventatively with success.    Preventative: COLONOSCOPY WITH PROPOFOL 02/05/2018 - 4 TA, SSA, diverticulosis, rpt 3 yrs [name], [name], MD)  Colonoscopy 10/2022 - fair prep, TAx2 one was 1cm in size, diverticulosis, consider rpt 1-2 yrs given fair prep Haig Prophet, Hilton Cork, MD)  Mammo Min[place] Breeding 09/2022 @ [facility] Pap smear - stopped around 77yo, s/p hysterectomy for heavy bleeding. Both ovaries remain. Has declined continued pelvic exam.  Dexa Date: 06/2013 [facility] - Tscore -1.1 forearm, normal spine and femur - will repeat this year.  DEXA 09/2019: T -0.3 spine, -1.3 L hip.  DEXA 09/2022 - T -1.4 L forearm, -1.2 L femur neck, not at increased risk of fracture per FRAX  Lung cancer screening - not eligible  Flu shot yearly [place] 11/2019, 12/2019. They wonder about autoimmune reaction to [place] leading to pancreas issue. She doesn't want to get booster for this reason.  Pneumovax 05/2012, prevnar-13 2015  Td 2006  Zostavax 2010  Shingrix - discussed - to consider  RSV - to consider Advanced directives: updated directives scanned 2022. Husband [name] is HCPOA then sons Raquel Sarna and Yahira Timberman. Grants discretion to [employer] for end of life decisions.  Seat belt use discussed.  Sunscreen use discussed. No changing moles on skin.  Sees dermatologist Nehemiah Massed).  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 Walking 30 min several times a week.  Diet: ok water intake 4 glasses/day, daily fruits/vegetables, red meat 4x/wk, fish 3-4x/wk      Relevant past medical, surgical, family and social  history reviewed and updated as indicated. Interim medical history since our last visit reviewed. Allergies and medications reviewed and updated. Outpatient Medications Prior to Visit  Medication Sig Dispense Refill   acetaminophen (TYLENOL) 500 MG tablet Take 500 mg by mouth 2 (two) times daily.     amoxicillin (AMOXIL) 500 MG capsule Take 2,000 mg by mouth See admin instructions. Take 2000 mg 1 hour prior to dental work     B-D UF III MINI PEN NEEDLES 31G X 5 MM MISC Inject 1 Device into the skin 4 (four) times daily. 400 each 3   Cholecalciferol (VITAMIN D3) 25 MCG (1000 UT) CAPS Take 2 capsules (2,000 Units total) by mouth daily.     Continuous Blood Gluc Sensor (DEXCOM G6 SENSOR) MISC 1 Device by Other route See admin instructions. Change every 10 days 9 each 3   CRANBERRY PO Take 4,200 mg by mouth 2 (two) times daily.     CRANBERRY-VITAMIN C-D MANNOSE PO Take 1,500 mg by mouth daily.     CREON 36000-114000 units CPEP capsule TAKE 2 CAPSULES BY MOUTH AS DIRECTED (ONE CAPSULE WITH SNACKS, TWO CAPSULES WITH MEALS) 180 capsule 11   D-MANNOSE PO Take 2 tablets by mouth daily with lunch.     diphenhydrAMINE (BENADRYL) 25 MG tablet Take 25 mg by mouth 2 (two) times daily as needed for allergies.     estradiol (ESTRACE) 0.1 MG/GM vaginal cream Estrogen Cream Instruction Discard applicator Apply pea sized amount to tip of finger to urethra before bed. Wash hands well after application. Use Monday, Wednesday and Friday 42.5 g 11   Glucagon (BAQSIMI ONE PACK) 3 MG/DOSE POWD Place 3 mg into the nose daily as needed (hypoglycemia).     glucose blood (ACCU-CHEK AVIVA PLUS) test strip 1 each by Other route 4 (four) times daily. And lancets 4/day 400 each 3   insulin glargine (LANTUS SOLOSTAR) 100 UNIT/ML Solostar Pen Inject 28 Units into the skin at bedtime. 30 mL 3   insulin lispro (HUMALOG KWIKPEN) 200 UNIT/ML KwikPen Max daily 100 units 30 mL 3   loperamide (IMODIUM A-D) 2 MG tablet Take 4 mg by mouth 4  (four) times daily as needed for diarrhea or loose stools.     Multiple Vitamin (MULTIVITAMIN WITH MINERALS) TABS tablet Take 1 tablet by mouth daily.     amLODipine (NORVASC) 10 MG tablet TAKE 1 TABLET BY MOUTH DAILY 90 tablet 0   benazepril-hydrochlorthiazide (LOTENSIN HCT) 20-12.5 MG tablet TAKE ONE-HALF TABLET BY MOUTH  DAILY 45 tablet 0   ferrous sulfate 325 (65 FE) MG tablet Take 1 tablet (325 mg total) by mouth daily with breakfast. 90 tablet 3   fluticasone (FLONASE) 50 MCG/ACT nasal spray Place 1 spray into both nostrils daily. 16 g 11   folic acid (FOLVITE) 1 MG tablet TAKE 1 TABLET BY MOUTH DAILY 90 tablet 0   meclizine (  ANTIVERT) 25 MG tablet Take 1 tablet (25 mg total) by mouth 3 (three) times daily as needed for nausea. 30 tablet 0   metoprolol succinate (TOPROL-XL) 50 MG 24 hr tablet TAKE 1 TABLET BY MOUTH DAILY  WITH OR IMMEDIATELY FOLLOWING A  MEAL 90 tablet 0   pantoprazole (PROTONIX) 40 MG tablet TAKE 1 TABLET BY MOUTH IN THE  EVENING 90 tablet 0   rosuvastatin (CRESTOR) 10 MG tablet TAKE 1 TABLET BY MOUTH DAILY 90 tablet 0   triamcinolone (KENALOG) 0.1 % paste SMARTSIG:Sparingly Topical 2-3 Times Daily     No facility-administered medications prior to visit.     Per HPI unless specifically indicated in ROS section below Review of Systems  Objective:  BP 124/82   Pulse 70   Temp 97.7 F (36.5 C) (Temporal)   Ht 5' (1.524 m)   Wt 163 lb 4 oz (74 kg)   SpO2 96%   BMI 31.88 kg/m   Wt Readings from Last 3 Encounters:  11/17/22 163 lb 4 oz (74 kg)  11/10/22 159 lb 9 oz (72.4 kg)  10/14/22 167 lb (75.8 kg)      Physical Exam Vitals and nursing note reviewed.  Constitutional:      Appearance: Normal appearance. She is not ill-appearing.  HENT:     Head: Normocephalic and atraumatic.     Right Ear: Tympanic membrane, ear canal and external ear normal. There is no impacted cerumen.     Left Ear: Tympanic membrane, ear canal and external ear normal. There is no  impacted cerumen.     Mouth/Throat:     Comments: Wearing mask Eyes:     General:        Right eye: No discharge.        Left eye: No discharge.     Extraocular Movements: Extraocular movements intact.     Conjunctiva/sclera: Conjunctivae normal.     Pupils: Pupils are equal, round, and reactive to light.  Neck:     Thyroid: No thyroid mass or thyromegaly.     Vascular: No carotid bruit.  Cardiovascular:     Rate and Rhythm: Normal rate and regular rhythm.     Pulses: Normal pulses.     Heart sounds: Normal heart sounds. No murmur heard. Pulmonary:     Effort: Pulmonary effort is normal. No respiratory distress.     Breath sounds: Normal breath sounds. No wheezing, rhonchi or rales.  Abdominal:     General: Bowel sounds are normal. There is no distension.     Palpations: Abdomen is soft. There is no mass.     Tenderness: There is no abdominal tenderness. There is no guarding or rebound.     Hernia: No hernia is present.  Musculoskeletal:     Cervical back: Normal range of motion and neck supple. No rigidity.     Right lower leg: No edema.     Left lower leg: No edema.  Lymphadenopathy:     Cervical: No cervical adenopathy.  Skin:    General: Skin is warm and dry.     Findings: No rash.  Neurological:     General: No focal deficit present.     Mental Status: She is alert. Mental status is at baseline.  Psychiatric:        Mood and Affect: Mood normal.        Behavior: Behavior normal.       Results for orders placed or performed during the hospital encounter of 11/08/22  Resp panel by RT-PCR (RSV, Flu A&B, Covid) Anterior Nasal Swab   Specimen: Anterior Nasal Swab  Result Value Ref Range   SARS Coronavirus 2 by RT PCR POSITIVE (A) NEGATIVE   Influenza A by PCR NEGATIVE NEGATIVE   Influenza B by PCR NEGATIVE NEGATIVE   Resp Syncytial Virus by PCR NEGATIVE NEGATIVE   Lab Results  Component Value Date   HGBA1C 7.8 (A) 06/30/2022   Assessment & Plan:   Problem  List Items Addressed This Visit     Advanced care planning/counseling discussion - Primary (Chronic)    Advanced directives: updated directives scanned 2022. Husband [name] is HCPOA then sons Raquel Sarna and Pura Picinich. Grants discretion to [employer] for end of life decisions.       Controlled diabetes mellitus type 2 with complications (HCC)    Chronic, stable period. Update labs. She's had to lower insulin dose during COVID infection.       Relevant Medications   benazepril-hydrochlorthiazide (LOTENSIN HCT) 20-12.5 MG tablet   rosuvastatin (CRESTOR) 10 MG tablet   Accelerated hypertension    Chronic, stable period on current regimen - continue.       Relevant Medications   amLODipine (NORVASC) 10 MG tablet   benazepril-hydrochlorthiazide (LOTENSIN HCT) 20-12.5 MG tablet   metoprolol succinate (TOPROL-XL) 50 MG 24 hr tablet   rosuvastatin (CRESTOR) 10 MG tablet   Dyslipidemia associated with type 2 diabetes mellitus (HCC)    Chronic, continues rosuvastatin '10mg'$  daily, update FLP.  The 10-year ASCVD risk score (Arnett DK, et al., 2019) is: 45.3%   Values used to calculate the score:     Age: 45 years     Sex: Female     Is Non-Hispanic African American: No     Diabetic: Yes     Tobacco smoker: No     Systolic Blood Pressure: 093 mmHg     Is BP treated: Yes     HDL Cholesterol: 53.4 mg/dL     Total Cholesterol: 171 mg/dL       Relevant Medications   benazepril-hydrochlorthiazide (LOTENSIN HCT) 20-12.5 MG tablet   rosuvastatin (CRESTOR) 10 MG tablet   History of recurrent UTIs    Sees urology, stable period on daily cranberry and D mannitose      GERD (gastroesophageal reflux disease)    Continue pantoprazole '40mg'$  daily indefinitely.      Relevant Medications   meclizine (ANTIVERT) 25 MG tablet   pantoprazole (PROTONIX) 40 MG tablet   Osteopenia    Per latest DEXA osteopenia, not at increased fracture risk.  Continue vit D and good dietary calcium intake.        Obesity, Class I, BMI 30-34.9   CKD stage 3 due to type 2 diabetes mellitus ([place])    Update labs today.       Relevant Medications   benazepril-hydrochlorthiazide (LOTENSIN HCT) 20-12.5 MG tablet   rosuvastatin (CRESTOR) 10 MG tablet   Other Relevant Orders   Phosphorus   Chronic diarrhea    Pancreatic insufficiency related - continues creon      Necrotizing pancreatitis    H/o this now on creon      Relevant Medications   pantoprazole (PROTONIX) 40 MG tablet   Chronic pancreatitis (HCC)    Followed by endo on Creon      Relevant Medications   pantoprazole (PROTONIX) 40 MG tablet   Aortic atherosclerosis (HCC)    Continue crestor.       Relevant Medications   amLODipine (NORVASC) 10  MG tablet   benazepril-hydrochlorthiazide (LOTENSIN HCT) 20-12.5 MG tablet   metoprolol succinate (TOPROL-XL) 50 MG 24 hr tablet   rosuvastatin (CRESTOR) 10 MG tablet   Pancreatic insufficiency    Continues creon through endo.       HTN (hypertension)    Chronic, stable. Continue current regimen.       Relevant Medications   amLODipine (NORVASC) 10 MG tablet   benazepril-hydrochlorthiazide (LOTENSIN HCT) 20-12.5 MG tablet   metoprolol succinate (TOPROL-XL) 50 MG 24 hr tablet   rosuvastatin (CRESTOR) 10 MG tablet   Iron deficiency anemia    Continue oral iron daily       Relevant Medications   ferrous sulfate 325 (65 FE) MG tablet   folic acid (FOLVITE) 1 MG tablet   RESOLVED: COVID-19 virus infection    Symptoms largely resolved. She did not take antiviral.         Meds ordered this encounter  Medications   amLODipine (NORVASC) 10 MG tablet    Sig: Take 1 tablet (10 mg total) by mouth daily.    Dispense:  90 tablet    Refill:  4   benazepril-hydrochlorthiazide (LOTENSIN HCT) 20-12.5 MG tablet    Sig: Take 0.5 tablets by mouth daily.    Dispense:  45 tablet    Refill:  4   ferrous sulfate 325 (65 FE) MG tablet    Sig: Take 1 tablet (325 mg total) by mouth daily with  breakfast.    Dispense:  90 tablet    Refill:  4   fluticasone (FLONASE) 50 MCG/ACT nasal spray    Sig: Place 1 spray into both nostrils daily.    Dispense:  16 g    Refill:  11   folic acid (FOLVITE) 1 MG tablet    Sig: Take 1 tablet (1 mg total) by mouth daily.    Dispense:  90 tablet    Refill:  4   meclizine (ANTIVERT) 25 MG tablet    Sig: Take 1 tablet (25 mg total) by mouth 3 (three) times daily as needed for nausea.    Dispense:  30 tablet    Refill:  0   metoprolol succinate (TOPROL-XL) 50 MG 24 hr tablet    Sig: TAKE 1 TABLET BY MOUTH DAILY  WITH OR IMMEDIATELY FOLLOWING A  MEAL    Dispense:  90 tablet    Refill:  4   pantoprazole (PROTONIX) 40 MG tablet    Sig: Take 1 tablet (40 mg total) by mouth every evening.    Dispense:  90 tablet    Refill:  4   rosuvastatin (CRESTOR) 10 MG tablet    Sig: Take 1 tablet (10 mg total) by mouth daily.    Dispense:  90 tablet    Refill:  4   triamcinolone (KENALOG) 0.1 % paste    Sig: SMARTSIG:Sparingly Topical 2-3 Times Daily    Dispense:  5 g    Refill:  1    Orders Placed This Encounter  Procedures   Phosphorus    Patient Instructions  If interested, check with pharmacy about new 2 shot shingles series (shingrix) as well as RSV shot.  Labs today  Urine test today.  Good to see you today Return in 6 months for follow up visit.    Follow up plan Return in about 6 months (around 05/18/2023), or if symptoms worsen or fail to improve, for follow up visit.  [name], MD

## 2022-11-17 NOTE — Assessment & Plan Note (Addendum)
Chronic, stable period on current regimen - continue.

## 2022-11-17 NOTE — Assessment & Plan Note (Signed)
Advanced directives: updated directives scanned 2022. Husband [name] is HCPOA then sons Raquel Sarna and Jennah Satchell. Grants discretion to [employer] for end of life decisions.

## 2022-11-17 NOTE — Assessment & Plan Note (Addendum)
Chronic, stable period. Update labs. She's had to lower insulin dose during COVID infection.

## 2022-11-17 NOTE — Assessment & Plan Note (Signed)
Symptoms largely resolved. She did not take antiviral.

## 2022-11-17 NOTE — Assessment & Plan Note (Addendum)
H/o this now on creon

## 2022-11-17 NOTE — Patient Instructions (Addendum)
If interested, check with pharmacy about new 2 shot shingles series (shingrix) as well as RSV shot.  Labs today  Urine test today.  Good to see you today Return in 6 months for follow up visit.

## 2022-11-17 NOTE — Assessment & Plan Note (Signed)
Followed by endo on Creon

## 2022-11-17 NOTE — Assessment & Plan Note (Addendum)
Chronic, continues rosuvastatin '10mg'$  daily, update FLP.  The 10-year ASCVD risk score (Arnett DK, et al., 2019) is: 45.3%   Values used to calculate the score:     Age: 77 years     Sex: Female     Is Non-Hispanic African American: No     Diabetic: Yes     Tobacco smoker: No     Systolic Blood Pressure: 765 mmHg     Is BP treated: Yes     HDL Cholesterol: 53.4 mg/dL     Total Cholesterol: 171 mg/dL

## 2022-11-17 NOTE — Assessment & Plan Note (Signed)
Sees urology, stable period on daily cranberry and D mannitose

## 2022-11-17 NOTE — Assessment & Plan Note (Signed)
Update labs today 

## 2022-11-17 NOTE — Assessment & Plan Note (Addendum)
Continue pantoprazole '40mg'$  daily indefinitely.

## 2022-11-18 LAB — PARATHYROID HORMONE, INTACT (NO CA): PTH: 97 pg/mL — ABNORMAL HIGH (ref 16–77)

## 2022-11-20 ENCOUNTER — Other Ambulatory Visit: Payer: Self-pay | Admitting: Family Medicine

## 2022-11-20 ENCOUNTER — Telehealth: Payer: Self-pay

## 2022-11-20 MED ORDER — FERROUS SULFATE 325 (65 FE) MG PO TABS: 325.0000 mg | ORAL_TABLET | ORAL | Status: AC

## 2022-11-20 NOTE — Telephone Encounter (Signed)
     Patient  visit on 11/08/2022  at [facility] was for COVID-19, cough.  Have you been able to follow up with your primary care physician? Patient had follow up visit on 11/10/2022.  The patient was or was not able to obtain any needed medicine or equipment. No medications prescribed outside of window for Paxlovid.  Are there diet recommendations that you are having difficulty following? No  Patient expresses understanding of discharge instructions and education provided has no other needs at this time.    [facility] Guide   [email or web address]  ?? [phone]   Website: [email or web address]  [email or web address]

## 2022-11-21 ENCOUNTER — Encounter: Payer: Self-pay | Admitting: Internal Medicine

## 2022-11-21 ENCOUNTER — Ambulatory Visit (INDEPENDENT_AMBULATORY_CARE_PROVIDER_SITE_OTHER): Payer: Medicare Other | Admitting: Internal Medicine

## 2022-11-21 VITALS — BP 120/76 | HR 79 | Ht 60.0 in | Wt 163.6 lb

## 2022-11-21 DIAGNOSIS — Z794 Long term (current) use of insulin: Secondary | ICD-10-CM

## 2022-11-21 DIAGNOSIS — E1122 Type 2 diabetes mellitus with diabetic chronic kidney disease: Secondary | ICD-10-CM | POA: Insufficient documentation

## 2022-11-21 DIAGNOSIS — E1165 Type 2 diabetes mellitus with hyperglycemia: Secondary | ICD-10-CM

## 2022-11-21 DIAGNOSIS — N1832 Chronic kidney disease, stage 3b: Secondary | ICD-10-CM | POA: Diagnosis not present

## 2022-11-21 LAB — POCT GLYCOSYLATED HEMOGLOBIN (HGB A1C): Hemoglobin A1C: 7.7 % — AB (ref 4.0–5.6)

## 2022-11-21 NOTE — Patient Instructions (Addendum)
Continue Lantus 20 units daily  Change  Humalog 18 units with Breakfast, 16 units with Lunch and 18 units with Supper  Continue Humalog 8 units with a bedtime snack  Humalog correctional insulin: ADD extra units on insulin to your meal-time Humalog dose if your blood sugars are higher than 160. Use the scale below to help guide you:   Blood sugar before meal Number of units to inject  Less than 160 0 unit  161 - 190 1 unit  191 - 220 2 units  221 - 250 3 units  251 - 280 4 units  281 - 310 5 units  311 - 340 6 units  341 - 370 7 units  371 - 400 8 units     HOW TO TREAT LOW BLOOD SUGARS (Blood sugar LESS THAN 70 MG/DL) Please follow the RULE OF 15 for the treatment of hypoglycemia treatment (when your (blood sugars are less than 70 mg/dL)   STEP 1: Take 15 grams of carbohydrates when your blood sugar is low, which includes:  3-4 GLUCOSE TABS  OR 3-4 OZ OF JUICE OR REGULAR SODA OR ONE TUBE OF GLUCOSE GEL    STEP 2: RECHECK blood sugar in 15 MINUTES STEP 3: If your blood sugar is still low at the 15 minute recheck --> then, go back to STEP 1 and treat AGAIN with another 15 grams of carbohydrates.

## 2022-11-21 NOTE — Progress Notes (Signed)
Name: Katherine Oliver  Age/ Sex: 77 y.o., female   MRN/ DOB: [id number], [birth year]     PCP: [name], MD   Reason for Endocrinology Evaluation: Type 2 Diabetes Mellitus  Initial Endocrine Consultative Visit: 11/25/2021    PATIENT IDENTIFIER: Katherine Oliver is a 77 y.o. female with a past medical history of DM, HTN, Hx of pancreatitis . The patient has followed with Endocrinology clinic since 11/25/2021 for consultative assistance with management of her diabetes.  DIABETIC HISTORY:  Katherine Oliver was diagnosed with DM 2015, and started insulin therapy in 2021.Katherine Oliver has Hx of pancreatitis . Her hemoglobin A1c has ranged from 6.5% in 2016, peaking at 8.3% in 2023.   Hx of DKA in 2021  Saw Dr. Loanne Drilling from  2016 until 01/2022  SUBJECTIVE:   During the last visit (06/30/2022): A1c 7.8%  Today (11/21/2022): Katherine Oliver  She checks her blood sugars multiple  times daily, through CGM. The patient has had hypoglycemic episodes since the last clinic visit, 6 episodes in the past 3 months mostly 70's. , but had a reading of 50's , she is typically she is symptomatic    Had an ED visit for cough following COVID exposure 11/08/2022 Denies cough , sore throat and fever resolved  Continues with chronic nausea and diarrhea      HOME DIABETES REGIMEN:  Lantus 28 units daily - takes 28 units  Humalog 16/16/20 Humalog 8 units with bedtime snack- not taking  CF : Humalog (BG-130/25)     Statin: yes ACE-I/ARB: yes     CONTINUOUS GLUCOSE MONITORING RECORD INTERPRETATION    Dates of Recording: 1/6-1/19/2023  Sensor description:dexcom  Results statistics:   CGM use % of time 93  Average and SD 182/52  Time in range 49  %  % Time Above 180 40  % Time above 250 10  % Time Below target <1    Glycemic patterns summary: BG's trend down at night but increase during the day   Hyperglycemic episodes  postprandial   Hypoglycemic episodes occurred after  a bolus  Overnight periods: variable      DIABETIC COMPLICATIONS: Microvascular complications:  CKD III Denies:  Last Eye Exam: Completed 03/24/2022  Macrovascular complications:   Denies: CAD, CVA, PVD   HISTORY:  Past Medical History:  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 nephropathy ([place])    DSME at [facility] 01/2016    COVID-19 virus infection 11/10/2022   DKA (diabetic ketoacidoses) 04/22/2020   Frequent epistaxis 05/16/2019   S/p cauterization with resolution 2020   GERD (gastroesophageal reflux disease)    Hepatic steatosis    by abd [place] 05/2012, mild transaminitis - normal iron sat and viral hep panel (2011), stable [place] 2017   History of chicken pox    History of measles    History of recurrent UTIs    on chronic keflex   HLD (hyperlipidemia)    HTN (hypertension)    Hypertensive retinopathy of both eyes, 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   [place]   Kidney cyst, acquired 01/2016   L kidney by US   Kidney stone 01/2016   L kidney by [place]   Lung nodules 11/2013   overall stable on f/u CT 01/2016   Osteopenia 06/2013  mild, forearm T -1.1, hip and spine WNL   Pancreatitis    Polycythemia    mild, stable (2013)   Primary localized osteoarthritis of right knee 01/05/2019   Rosacea    metrogel   Past Surgical History:  Past Surgical History:  Procedure Laterality Date   APPENDECTOMY  1987   BILIARY STENT PLACEMENT  12/14/2019   Procedure: BILIARY STENT PLACEMENT;  Surgeon: [name], MD;  Location: [facility] ENDOSCOPY;  Service: Endoscopy;;   BREAST BIOPSY Right 1963   benign   BREAST BIOPSY Right 08/2014   benign- core   cardiolite stress test  04/2004   normal   CESAREAN SECTION  2595;6387   x2   CHOLECYSTECTOMY  2003   COLONOSCOPY  09/26/2008   adenomatous polyp, rpt  3-5 yrs   COLONOSCOPY  08/2012   adenomatous polyps, diverticulosis, rec rpt 5 yrs [name])   COLONOSCOPY WITH PROPOFOL N/A 02/05/2018   4TA, SSA, diverticulosis, rpt 3 yrs [name], [name], MD)   COLONOSCOPY WITH PROPOFOL N/A 10/14/2022   fair prep, TAx2 one was 1cm in size, diverticulosis, consider rpt 1-2 yrs given fair prep Haig Prophet, Hilton Cork, MD)   dexa  2003   normal   dexa  06/2013   [facility] - Tscore -1.1 forearm, normal spine and femur   ERCP N/A 12/14/2019   Procedure: ENDOSCOPIC RETROGRADE CHOLANGIOPANCREATOGRAPHY (ERCP);  Surgeon: [name], MD;  Location: [place];  Service: Endoscopy;  Laterality: N/A;   ERCP  01/2020   nonbleeding gastric ulcer - [facility] hospitalization (Dr Mont Dutton)   ESOPHAGOGASTRODUODENOSCOPY N/A 12/19/2019   Procedure: ESOPHAGOGASTRODUODENOSCOPY (EGD);  Surgeon: [name], MD;  Location: [facility] ENDOSCOPY;  Service: Endoscopy;  Laterality: N/A;   ESOPHAGOGASTRODUODENOSCOPY  01/2020   pre existing AXIOS cystogastrostomy stent s/p necrosectomy - [facility] hospitalization (Dr Mont Dutton)   ESOPHAGOGASTRODUODENOSCOPY N/A 04/24/2020   Procedure: ESOPHAGOGASTRODUODENOSCOPY (EGD);  Surgeon: Wonda Horner, MD;  Location: [facility] ENDOSCOPY;  Service: Endoscopy;  Laterality: N/A;   ESOPHAGOGASTRODUODENOSCOPY  05/2020   normal esophagus, duodenum, erythematous mucosa in gastric body, gastric cystogastrostomy stent removed ([facility])   IR FLUORO GUIDE CV LINE RIGHT  12/27/2019   IR FLUORO GUIDE CV LINE RIGHT  04/24/2020   IR IVC FILTER PLMT / S&I /IMG GUID/MOD SED  04/26/2020   IR IVC FILTER RETRIEVAL / S&I /IMG GUID/MOD SED  07/31/2021   IR RADIOLOGIST EVAL & MGMT  07/31/2020   IR RADIOLOGIST EVAL & MGMT  01/15/2021   IR RADIOLOGIST EVAL & MGMT  06/19/2021   IR REMOVAL TUN CV CATH W/O FL  01/03/2020   IR REMOVAL TUN CV CATH W/O FL  04/29/2020   IR REPLC DUODEN/JEJUNO TUBE PERCUT W/FLUORO  04/01/2020   IR US GUIDE VASC ACCESS RIGHT  04/24/2020   SPHINCTEROTOMY   12/14/2019   Procedure: SPHINCTEROTOMY;  Surgeon: [name], MD;  Location: [place];  Service: Endoscopy;;   TOTAL KNEE ARTHROPLASTY Right 01/17/2019   Procedure: TOTAL KNEE ARTHROPLASTY;  Surgeon: [name], MD;  Location: [facility]S;  Service: Orthopedics;  Laterality: Right;   TRANSTHORACIC ECHOCARDIOGRAM  04/2019   EF 55-60%, modLVH, impaired relaxation    TRIGGER FINGER RELEASE  2007;2010;2011   bilateral   TRIGGER FINGER RELEASE  02/2017   VAGINAL HYSTERECTOMY  1984   for menorrhagia, ovaries in place   Social History:  reports that she has never smoked. She has never been exposed to tobacco smoke. She has never used smokeless tobacco. She reports that she does not currently use alcohol. She reports  that she does not use drugs. Family History:  Family History  Problem Relation Age of Onset   Stroke Mother        several   Hyperlipidemia Mother    Hypertension Mother    Cancer Father        colon   Hypertension Father    Hyperlipidemia Father    Coronary artery disease Father 57       MIx1, CABG   Cancer Paternal Aunt        abdominal   Coronary artery disease Maternal Grandmother    Diabetes Maternal Grandfather    Coronary artery disease Maternal Grandfather    Breast cancer Neg Hx      HOME MEDICATIONS: Allergies as of 11/21/2022       Reactions   Azithromycin Itching   Okay if takes benadryl along with it   Nickel    Reaction to cheap earrings cause redness   Sulfa Antibiotics Itching   Vinegar [acetic Acid] Nausea Only   Adhesive [tape] Rash   Paper tape only causes blisters   Keflex [cephalexin] Rash        Medication List        Accurate as of November 21, 2022 12:21 PM. If you have any questions, ask your nurse or doctor.          Accu-Chek Aviva Plus test strip Generic drug: glucose blood 1 each by Other route 4 (four) times daily. And lancets 4/day   acetaminophen 500 MG tablet Commonly known as: TYLENOL Take 500 mg by mouth 2  (two) times daily.   amLODipine 10 MG tablet Commonly known as: NORVASC Take 1 tablet (10 mg total) by mouth daily.   amoxicillin 500 MG capsule Commonly known as: AMOXIL Take 2,000 mg by mouth See admin instructions. Take 2000 mg 1 hour prior to dental work   B-D UF III MINI PEN NEEDLES 31G X 5 MM Misc Generic drug: Insulin Pen Needle Inject 1 Device into the skin 4 (four) times daily.   Baqsimi One Pack 3 MG/DOSE Powd Generic drug: Glucagon Place 3 mg into the nose daily as needed (hypoglycemia).   benazepril-hydrochlorthiazide 20-12.5 MG tablet Commonly known as: LOTENSIN HCT Take 0.5 tablets by mouth daily.   CRANBERRY PO Take 4,200 mg by mouth 2 (two) times daily.   CRANBERRY-VITAMIN C-D MANNOSE PO Take 1,500 mg by mouth daily.   Creon 36000 UNITS Cpep capsule Generic drug: lipase/protease/amylase TAKE 2 CAPSULES BY MOUTH AS DIRECTED (ONE CAPSULE WITH SNACKS, TWO CAPSULES WITH MEALS)   D-MANNOSE PO Take 2 tablets by mouth daily with lunch.   Dexcom G6 Sensor Misc 1 Device by Other route See admin instructions. Change every 10 days   diphenhydrAMINE 25 MG tablet Commonly known as: BENADRYL Take 25 mg by mouth 2 (two) times daily as needed for allergies.   estradiol 0.1 MG/GM vaginal cream Commonly known as: ESTRACE Estrogen Cream Instruction Discard applicator Apply pea sized amount to tip of finger to urethra before bed. Wash hands well after application. Use Monday, Wednesday and Friday   ferrous sulfate 325 (65 FE) MG tablet Take 1 tablet (325 mg total) by mouth every other day.   fluticasone 50 MCG/ACT nasal spray Commonly known as: FLONASE Place 1 spray into both nostrils daily.   folic acid 1 MG tablet Commonly known as: FOLVITE Take 1 tablet (1 mg total) by mouth daily.   HumaLOG KwikPen 200 UNIT/ML KwikPen Generic drug: insulin lispro Max daily 100 units  Lantus SoloStar 100 UNIT/ML Solostar Pen Generic drug: insulin glargine Inject 28 Units  into the skin at bedtime. What changed: how much to take   loperamide 2 MG tablet Commonly known as: IMODIUM A-D Take 4 mg by mouth 4 (four) times daily as needed for diarrhea or loose stools.   meclizine 25 MG tablet Commonly known as: ANTIVERT Take 1 tablet (25 mg total) by mouth 3 (three) times daily as needed for nausea.   metoprolol succinate 50 MG 24 hr tablet Commonly known as: TOPROL-XL TAKE 1 TABLET BY MOUTH DAILY  WITH OR IMMEDIATELY FOLLOWING A  MEAL   multivitamin with minerals Tabs tablet Take 1 tablet by mouth daily.   pantoprazole 40 MG tablet Commonly known as: PROTONIX Take 1 tablet (40 mg total) by mouth every evening.   rosuvastatin 10 MG tablet Commonly known as: CRESTOR Take 1 tablet (10 mg total) by mouth daily.   triamcinolone 0.1 % paste Commonly known as: KENALOG SMARTSIG:Sparingly Topical 2-3 Times Daily   Vitamin D3 25 MCG (1000 UT) Caps Take 2 capsules (2,000 Units total) by mouth daily.         OBJECTIVE:   Vital Signs: BP 120/76 (BP Location: Left Arm, Patient Position: Sitting, Cuff Size: Large)   Pulse 79   Ht 5' (1.524 m)   Wt 163 lb 9.6 oz (74.2 kg)   SpO2 96%   BMI 31.95 kg/m   Wt Readings from Last 3 Encounters:  11/21/22 163 lb 9.6 oz (74.2 kg)  11/17/22 163 lb 4 oz (74 kg)  11/10/22 159 lb 9 oz (72.4 kg)     Exam: General: Katherine Oliver appears well and is in NAD  Neck: General: Supple without adenopathy. Thyroid: Thyroid size normal.  No goiter or nodules appreciated.  Lungs: Clear with good BS bilat with no rales, rhonchi, or wheezes  Heart: RRR with normal S1 and S2 and no gallops; no murmurs; no rub  Extremities: No pretibial edema.  Neuro: MS is good with appropriate affect, Katherine Oliver is alert and Ox3    DM foot exam: 06/30/2022  The skin of the feet is intact without sores or ulcerations. The pedal pulses are 2+ on right and 2+ on left. The sensation is intact to a screening 5.07, 10 gram monofilament bilaterally         DATA REVIEWED:  Lab Results  Component Value Date   HGBA1C 7.7 (A) 11/21/2022   HGBA1C 7.6 (H) 11/17/2022   HGBA1C 7.8 (A) 06/30/2022    Latest Reference Range & Units 11/17/22 13:03  Sodium 135 - 145 mEq/L 140  Potassium 3.5 - 5.1 mEq/L 4.9  Chloride 96 - 112 mEq/L 105  CO2 19 - 32 mEq/L 25  Glucose 70 - 99 mg/dL 110 (H)  BUN 6 - 23 mg/dL 33 (H)  Creatinine 0.40 - 1.20 mg/dL 1.37 (H)  Calcium 8.4 - 10.5 mg/dL 9.6  Phosphorus 2.3 - 4.6 mg/dL 3.6  Alkaline Phosphatase 39 - 117 U/L 74  Albumin 3.5 - 5.2 g/dL 4.7  AST 0 - 37 U/L 16  ALT 0 - 35 U/L 18  Total Protein 6.0 - 8.3 g/dL 7.0  Total Bilirubin 0.2 - 1.2 mg/dL 0.5  GFR >60.00 mL/min 37.05 (L)    ASSESSMENT / PLAN / RECOMMENDATIONS:   1) Type 2 Diabetes Mellitus, Sub-Optimally controlled, With CKD III complications - Most recent A1c of 7.7 %. Goal A1c < 7.5 %.     -A1c stable  -She is NOT a candidate for  GLP-1 agonist, DPP 4 inhibitors nor Mounjaro due to history of pancreatitis -Limited glycemic agents due to CKD -Since COVID, she has been noted with decreased oral intake, I did advise the patient that she is going to eat a smaller meal than usual, she may reduce her prandial insulin by 50% -I will also change her sensitivity factor from 25 to 30 to prevent hypoglycemia   MEDICATIONS: Continue Lantus 20 units daily Change Humalog 18 units with breakfast, 16 units with lunch, and 18 units with supper Continue Humalog 8 units with bedtime snack Change correction factor: Humalog (BG -130/30)  EDUCATION / INSTRUCTIONS: BG monitoring instructions: Patient is instructed to check her blood sugars 4 times a day, before each meal ad bedtime . Call [facility] if: BG persistently < 70  I reviewed the Rule of 15 for the treatment of hypoglycemia in detail with the patient. Literature supplied.     2) Diabetic complications:  Eye: Does not have known diabetic retinopathy.  Neuro/ Feet: Does not  have known diabetic peripheral neuropathy .  Renal: Patient does  have known baseline CKD. She   is  on an ACEI/ARB at present.      F/U in 6 months   Signed electronically by: Mack Guise, MD  [facility]  [facility] [address] [place], [place] [address] Phone: [phone] FAX: [phone]   CC: [name], MD [place] [address] Phone: [phone]  Fax: [phone]  Return to Endocrinology clinic as below: Future Appointments  Date Time Provider [place]  12/18/2022  2:00 PM Billey Co, MD [facility] None  04/15/2023 10:50 AM Kori Colin, Melanie Crazier, MD [facility] None  05/18/2023 11:00 AM [name], MD [facility]  10/12/2023 10:45 AM [facility] NURSE HEALTH ADVISOR [facility]

## 2022-11-29 ENCOUNTER — Other Ambulatory Visit: Payer: Self-pay | Admitting: Family Medicine

## 2022-11-29 DIAGNOSIS — K8689 Other specified diseases of pancreas: Secondary | ICD-10-CM

## 2022-12-01 NOTE — Telephone Encounter (Signed)
Message from [facility] mail order:  Please send a replace/new response with 90-Day Supply if appropriate to maximize member benefit. Requesting 1 year supply.  They are requesting #[phone] with 3 additional refills and with Sig: TAKE 2 CAPSULES BY MOUTH 3 TIMES DAILY WITH MEALS.  Creon Last filled: 10/05/22, #[phone] (at [facility]) Last OV: 11/17/22, CPE Next OV: 05/18/23,  6 mo f/u

## 2022-12-03 ENCOUNTER — Encounter: Payer: Self-pay | Admitting: Family Medicine

## 2022-12-11 ENCOUNTER — Other Ambulatory Visit: Payer: Self-pay

## 2022-12-11 MED ORDER — HUMALOG KWIKPEN 200 UNIT/ML ~~LOC~~ SOPN: PEN_INJECTOR | SUBCUTANEOUS | 3 refills | Status: DC

## 2022-12-18 ENCOUNTER — Ambulatory Visit (INDEPENDENT_AMBULATORY_CARE_PROVIDER_SITE_OTHER): Payer: Medicare Other | Admitting: Urology

## 2022-12-18 ENCOUNTER — Encounter: Payer: Self-pay | Admitting: Urology

## 2022-12-18 DIAGNOSIS — Z87440 Personal history of urinary (tract) infections: Secondary | ICD-10-CM

## 2022-12-18 DIAGNOSIS — N390 Urinary tract infection, site not specified: Secondary | ICD-10-CM

## 2022-12-18 MED ORDER — ESTRADIOL 0.1 MG/GM VA CREA: TOPICAL_CREAM | VAGINAL | 11 refills | Status: DC

## 2022-12-18 NOTE — Progress Notes (Signed)
   12/18/2022 2:13 PM   Katherine Oliver 1946/01/28 [id number]  Reason for visit: Follow up recurrent UTIs  HPI: 77 year old female with history of recurrent Enterococcus UTIs, who also had a long complex course with necrotizing pancreatitis.  She currently has been managed on a cranberry tablet prophylaxis, d-mannose, and topical estrogen cream.    She had 1 E. coli UTI in August 2022 treated with culture appropriate Cipro by her PA [facility], and symptoms resolved.    She denies any significant urinary symptoms, some very mild stress and occasional urge incontinence.  Not bothersome enough to consider medications at this time.  We reviewed Kegel exercises.  -Topical estrogen cream refilled -Can follow-up with urology as needed, topical estrogen cream can be refilled by PCP moving forward  Katherine Oliver, [place] [address] [place], [place] [address] [phone]

## 2023-01-08 ENCOUNTER — Other Ambulatory Visit: Payer: Self-pay | Admitting: Urology

## 2023-01-08 DIAGNOSIS — N390 Urinary tract infection, site not specified: Secondary | ICD-10-CM

## 2023-01-13 ENCOUNTER — Other Ambulatory Visit: Payer: Self-pay

## 2023-01-13 MED ORDER — HUMALOG KWIKPEN 200 UNIT/ML ~~LOC~~ SOPN: PEN_INJECTOR | SUBCUTANEOUS | 3 refills | Status: DC

## 2023-01-22 ENCOUNTER — Other Ambulatory Visit: Payer: Self-pay | Admitting: Internal Medicine

## 2023-01-22 ENCOUNTER — Other Ambulatory Visit: Payer: Self-pay

## 2023-01-22 MED ORDER — LANTUS SOLOSTAR 100 UNIT/ML ~~LOC~~ SOPN: 20.0000 [IU] | PEN_INJECTOR | Freq: Every day | SUBCUTANEOUS | 1 refills | Status: DC

## 2023-02-17 ENCOUNTER — Telehealth: Payer: Self-pay

## 2023-02-17 NOTE — Telephone Encounter (Signed)
Inbound fax requesting clinical notes. Notes routed via Epic.

## 2023-03-19 DIAGNOSIS — H5213 Myopia, bilateral: Secondary | ICD-10-CM | POA: Diagnosis not present

## 2023-03-19 DIAGNOSIS — H524 Presbyopia: Secondary | ICD-10-CM | POA: Diagnosis not present

## 2023-03-19 DIAGNOSIS — H52223 Regular astigmatism, bilateral: Secondary | ICD-10-CM | POA: Diagnosis not present

## 2023-03-19 DIAGNOSIS — E119 Type 2 diabetes mellitus without complications: Secondary | ICD-10-CM | POA: Diagnosis not present

## 2023-03-19 DIAGNOSIS — H2513 Age-related nuclear cataract, bilateral: Secondary | ICD-10-CM | POA: Diagnosis not present

## 2023-03-19 LAB — HM DIABETES EYE EXAM

## 2023-04-15 ENCOUNTER — Other Ambulatory Visit: Payer: Self-pay | Admitting: Internal Medicine

## 2023-04-15 ENCOUNTER — Encounter: Payer: Self-pay | Admitting: Internal Medicine

## 2023-04-15 ENCOUNTER — Other Ambulatory Visit (HOSPITAL_COMMUNITY): Payer: Self-pay

## 2023-04-15 ENCOUNTER — Ambulatory Visit (INDEPENDENT_AMBULATORY_CARE_PROVIDER_SITE_OTHER): Payer: Medicare Other | Admitting: Internal Medicine

## 2023-04-15 VITALS — Ht 60.0 in | Wt 171.0 lb

## 2023-04-15 DIAGNOSIS — N1832 Chronic kidney disease, stage 3b: Secondary | ICD-10-CM | POA: Diagnosis not present

## 2023-04-15 DIAGNOSIS — E1122 Type 2 diabetes mellitus with diabetic chronic kidney disease: Secondary | ICD-10-CM

## 2023-04-15 DIAGNOSIS — E1165 Type 2 diabetes mellitus with hyperglycemia: Secondary | ICD-10-CM | POA: Diagnosis not present

## 2023-04-15 DIAGNOSIS — Z794 Long term (current) use of insulin: Secondary | ICD-10-CM | POA: Diagnosis not present

## 2023-04-15 LAB — POCT GLYCOSYLATED HEMOGLOBIN (HGB A1C): Hemoglobin A1C: 7.3 % — AB (ref 4.0–5.6)

## 2023-04-15 MED ORDER — HUMALOG KWIKPEN 200 UNIT/ML ~~LOC~~ SOPN: PEN_INJECTOR | SUBCUTANEOUS | 3 refills | Status: DC

## 2023-04-15 MED ORDER — BD PEN NEEDLE MINI U/F 31G X 5 MM MISC: 1.0000 | Freq: Four times a day (QID) | 3 refills | Status: DC

## 2023-04-15 MED ORDER — INSULIN GLARGINE 100 UNIT/ML SOLOSTAR PEN: 26.0000 [IU] | PEN_INJECTOR | Freq: Every day | SUBCUTANEOUS | 3 refills | Status: DC

## 2023-04-15 NOTE — Patient Instructions (Addendum)
Increase Lantus 26 units daily  Change Humalog 18 units with Breakfast, 18 units with Lunch and 20 units with Supper  Snack time Humalog 10-16 units with a bedtime snack  Humalog correctional insulin: ADD extra units on insulin to your meal-time Humalog dose if your blood sugars are higher than 160. Use the scale below to help guide you:   Blood sugar before meal Number of units to inject  Less than 160 0 unit  161 - 190 1 unit  191 - 220 2 units  221 - 250 3 units  251 - 280 4 units  281 - 310 5 units  311 - 340 6 units  341 - 370 7 units  371 - 400 8 units     HOW TO TREAT LOW BLOOD SUGARS (Blood sugar LESS THAN 70 MG/DL) Please follow the RULE OF 15 for the treatment of hypoglycemia treatment (when your (blood sugars are less than 70 mg/dL)   STEP 1: Take 15 grams of carbohydrates when your blood sugar is low, which includes:  3-4 GLUCOSE TABS  OR 3-4 OZ OF JUICE OR REGULAR SODA OR ONE TUBE OF GLUCOSE GEL    STEP 2: RECHECK blood sugar in 15 MINUTES STEP 3: If your blood sugar is still low at the 15 minute recheck --> then, go back to STEP 1 and treat AGAIN with another 15 grams of carbohydrates.

## 2023-04-15 NOTE — Telephone Encounter (Signed)
Brand Lantus is covered

## 2023-04-15 NOTE — Progress Notes (Signed)
Name: Katherine Oliver  Age/ Sex: 77 y.o., female   MRN/ DOB: [id number], 02-18-1946     PCP: Eustaquio Boyden, MD   Reason for Endocrinology Evaluation: Type 2 Diabetes Mellitus  Initial Endocrine Consultative Visit: 11/25/2021    PATIENT IDENTIFIER: Katherine Oliver is a 77 y.o. female with a past medical history of DM, HTN, Hx of pancreatitis . The patient has followed with Endocrinology clinic since 11/25/2021 for consultative assistance with management of her diabetes.  DIABETIC HISTORY:  Ms. Fistler was diagnosed with DM 2015, and started insulin therapy in 2021.Pt has Hx of pancreatitis . Her hemoglobin A1c has ranged from 6.5% in 2016, peaking at 8.3% in 2023.   Hx of DKA in 2021  Saw Dr. Everardo All from  2016 until 01/2022  SUBJECTIVE:   During the last visit (11/21/2022): A1c 7.7%  Today (04/15/2023): Katherine Oliver  She checks her blood sugars multiple  times daily, through CGM. The patient has not had hypoglycemic episodes since the last clinic visit.  She follows with urology for recurrent UTIs , but she has not had any UTI in the past year  Continues with chronic nausea and diarrhea due to pancreatitis, on Creon, there was a period in time where she had to rationalize the dose due to shortage of supplies Has not seen GI in a couple of years   She tends to take around 16 units of Humalog around 9 PM due to hyperglycemia.  She used it last night, but she did eat a piece of cake   HOME DIABETES REGIMEN:  Lantus 20-28 units daily  Humalog 18/16/18 TIDQAC Humalog 8 units with bedtime snack- not taking  CF : Humalog (BG-130/30)     Statin: yes ACE-I/ARB: yes     CONTINUOUS GLUCOSE MONITORING RECORD INTERPRETATION    Dates of Recording: 5/30-6/10/2023  Sensor description:dexcom  Results statistics:   CGM use % of time 93  Average and SD 201/50  Time in range 35%  % Time Above 180 47  % Time above 250 18  % Time Below target 0     Glycemic patterns summary: BG's trend down to just below the upper limit of normal, and fluctuate during the day Hyperglycemic episodes  postprandial   Hypoglycemic episodes occurred N/A  Overnight periods: Optimal      DIABETIC COMPLICATIONS: Microvascular complications:  CKD III Denies:  Last Eye Exam: Completed 03/24/2022  Macrovascular complications:   Denies: CAD, CVA, PVD   HISTORY:  Past Medical History:  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 nephropathy (HCC)    DSME at [facility] 01/2016    COVID-19 virus infection 11/10/2022   DKA (diabetic ketoacidoses) 04/22/2020   Frequent epistaxis 05/16/2019   S/p cauterization with resolution 2020   GERD (gastroesophageal reflux disease)    Hepatic steatosis    by abd [place] 05/2012, mild transaminitis - normal iron sat and viral hep panel (2011), stable [place] 2017   History of chicken pox    History of measles    History of recurrent UTIs    on chronic keflex   HLD (hyperlipidemia)    HTN (hypertension)    Hypertensive retinopathy of both eyes, 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   Bulakowski   Kidney cyst, acquired 01/2016  L kidney by US   Kidney stone 01/2016   L kidney by [place]   Lung nodules 11/2013   overall stable on f/u CT 01/2016   Osteopenia 06/2013   mild, forearm T -1.1, hip and spine WNL   Pancreatitis    Polycythemia    mild, stable (2013)   Primary localized osteoarthritis of right knee 01/05/2019   Rosacea    metrogel   Past Surgical History:  Past Surgical History:  Procedure Laterality Date   APPENDECTOMY  1987   BILIARY STENT PLACEMENT  12/14/2019   Procedure: BILIARY STENT PLACEMENT;  Surgeon: Jeani Hawking, MD;  Location: [facility] ENDOSCOPY;  Service: Endoscopy;;   BREAST BIOPSY Right 1963   benign   BREAST BIOPSY  Right 08/2014   benign- core   cardiolite stress test  04/2004   normal   CESAREAN SECTION  1610;[id number]   x2   CHOLECYSTECTOMY  2003   COLONOSCOPY  09/26/2008   adenomatous polyp, rpt 3-5 yrs   COLONOSCOPY  08/2012   adenomatous polyps, diverticulosis, rec rpt 5 yrs Marva Panda)   COLONOSCOPY WITH PROPOFOL N/A 02/05/2018   4TA, SSA, diverticulosis, rpt 3 yrs Marva Panda, Cindra Eves, MD)   COLONOSCOPY WITH PROPOFOL N/A 10/14/2022   fair prep, TAx2 one was 1cm in size, diverticulosis, rpt 1 yr given fair prep Mia Creek, Rossie Muskrat, MD)   dexa  2003   normal   dexa  06/2013   [facility] - Tscore -1.1 forearm, normal spine and femur   ERCP N/A 12/14/2019   Procedure: ENDOSCOPIC RETROGRADE CHOLANGIOPANCREATOGRAPHY (ERCP);  Surgeon: Jeani Hawking, MD;  Location: [facility] ENDOSCOPY;  Service: Endoscopy;  Laterality: N/A;   ERCP  01/2020   nonbleeding gastric ulcer - [facility] hospitalization (Dr Shana Chute)   ESOPHAGOGASTRODUODENOSCOPY N/A 12/19/2019   Procedure: ESOPHAGOGASTRODUODENOSCOPY (EGD);  Surgeon: Charna Elizabeth, MD;  Location: [facility] ENDOSCOPY;  Service: Endoscopy;  Laterality: N/A;   ESOPHAGOGASTRODUODENOSCOPY  01/2020   pre existing AXIOS cystogastrostomy stent s/p necrosectomy - [facility] hospitalization (Dr Shana Chute)   ESOPHAGOGASTRODUODENOSCOPY N/A 04/24/2020   Procedure: ESOPHAGOGASTRODUODENOSCOPY (EGD);  Surgeon: Graylin Shiver, MD;  Location: [facility] ENDOSCOPY;  Service: Endoscopy;  Laterality: N/A;   ESOPHAGOGASTRODUODENOSCOPY  05/2020   normal esophagus, duodenum, erythematous mucosa in gastric body, gastric cystogastrostomy stent removed ([facility])   IR FLUORO GUIDE CV LINE RIGHT  12/27/2019   IR FLUORO GUIDE CV LINE RIGHT  04/24/2020   IR IVC FILTER PLMT / S&I /IMG GUID/MOD SED  04/26/2020   IR IVC FILTER RETRIEVAL / S&I /IMG GUID/MOD SED  07/31/2021   IR RADIOLOGIST EVAL & MGMT  07/31/2020   IR RADIOLOGIST EVAL & MGMT  01/15/2021   IR RADIOLOGIST EVAL & MGMT  06/19/2021   IR REMOVAL TUN CV CATH W/O FL   01/03/2020   IR REMOVAL TUN CV CATH W/O FL  04/29/2020   IR REPLC DUODEN/JEJUNO TUBE PERCUT W/FLUORO  04/01/2020   IR US GUIDE VASC ACCESS RIGHT  04/24/2020   SPHINCTEROTOMY  12/14/2019   Procedure: SPHINCTEROTOMY;  Surgeon: Jeani Hawking, MD;  Location: [facility] ENDOSCOPY;  Service: Endoscopy;;   TOTAL KNEE ARTHROPLASTY Right 01/17/2019   Procedure: TOTAL KNEE ARTHROPLASTY;  Surgeon: Salvatore Marvel, MD;  Location: [facility] ORS;  Service: Orthopedics;  Laterality: Right;   TRANSTHORACIC ECHOCARDIOGRAM  04/2019   EF 55-60%, modLVH, impaired relaxation    TRIGGER FINGER RELEASE  2007;2010;2011   bilateral   TRIGGER FINGER RELEASE  02/2017   VAGINAL HYSTERECTOMY  1984   for menorrhagia, ovaries in place   Social  History:  reports that she has never smoked. She has never been exposed to tobacco smoke. She has never used smokeless tobacco. She reports that she does not currently use alcohol. She reports that she does not use drugs. Family History:  Family History  Problem Relation Age of Onset   Stroke Mother        several   Hyperlipidemia Mother    Hypertension Mother    Cancer Father        colon   Hypertension Father    Hyperlipidemia Father    Coronary artery disease Father 48       MIx1, CABG   Cancer Paternal Aunt        abdominal   Coronary artery disease Maternal Grandmother    Diabetes Maternal Grandfather    Coronary artery disease Maternal Grandfather    Breast cancer Neg Hx      HOME MEDICATIONS: Allergies as of 04/15/2023       Reactions   Azithromycin Itching   Okay if takes benadryl along with it   Nickel    Reaction to cheap earrings cause redness   Sulfa Antibiotics Itching   Vinegar [acetic Acid] Nausea Only   Adhesive [tape] Rash   Paper tape only causes blisters   Keflex [cephalexin] Rash        Medication List        Accurate as of April 15, 2023 10:49 AM. If you have any questions, ask your nurse or doctor.          Accu-Chek Aviva Plus test  strip Generic drug: glucose blood 1 each by Other route 4 (four) times daily. And lancets 4/day   acetaminophen 500 MG tablet Commonly known as: TYLENOL Take 500 mg by mouth 2 (two) times daily.   amLODipine 10 MG tablet Commonly known as: NORVASC Take 1 tablet (10 mg total) by mouth daily.   amoxicillin 500 MG capsule Commonly known as: AMOXIL Take 2,000 mg by mouth See admin instructions. Take 2000 mg 1 hour prior to dental work   B-D UF III MINI PEN NEEDLES 31G X 5 MM Misc Generic drug: Insulin Pen Needle Inject 1 Device into the skin 4 (four) times daily.   Baqsimi One Pack 3 MG/DOSE Powd Generic drug: Glucagon Place 3 mg into the nose daily as needed (hypoglycemia).   benazepril-hydrochlorthiazide 20-12.5 MG tablet Commonly known as: LOTENSIN HCT Take 0.5 tablets by mouth daily.   CRANBERRY PO Take 4,200 mg by mouth 2 (two) times daily.   CRANBERRY-VITAMIN C-D MANNOSE PO Take 1,500 mg by mouth daily.   Creon 36000 UNITS Cpep capsule Generic drug: lipase/protease/amylase TAKE 2 CAPSULES BY MOUTH 3 TIMES DAILY WITH MEALS   D-MANNOSE PO Take 2 tablets by mouth daily with lunch.   Dexcom G6 Sensor Misc 1 Device by Other route See admin instructions. Change every 10 days   diphenhydrAMINE 25 MG tablet Commonly known as: BENADRYL Take 25 mg by mouth 2 (two) times daily as needed for allergies.   estradiol 0.1 MG/GM vaginal cream Commonly known as: ESTRACE (DISCARD APPLICATOR) APPLY PEA SIZED AMOUNT TO TIP OF FINGER TO URETHRA BEFORE BED. WASH HANDS WELL AFTER APPLICATION. USE MONDAY, WEDNESDAY AND FRIDAY   ferrous sulfate 325 (65 FE) MG tablet Take 1 tablet (325 mg total) by mouth every other day.   fluticasone 50 MCG/ACT nasal spray Commonly known as: FLONASE Place 1 spray into both nostrils daily.   folic acid 1 MG tablet Commonly known as: [employer]  Take 1 tablet (1 mg total) by mouth daily.   HumaLOG KwikPen 200 UNIT/ML KwikPen Generic drug: insulin  lispro Max daily 100 units   insulin glargine 100 UNIT/ML Solostar Pen Commonly known as: LANTUS Inject 20 Units into the skin at bedtime. What changed: how much to take   loperamide 2 MG tablet Commonly known as: IMODIUM A-D Take 4 mg by mouth 4 (four) times daily as needed for diarrhea or loose stools.   meclizine 25 MG tablet Commonly known as: ANTIVERT Take 1 tablet (25 mg total) by mouth 3 (three) times daily as needed for nausea.   metoprolol succinate 50 MG 24 hr tablet Commonly known as: TOPROL-XL TAKE 1 TABLET BY MOUTH DAILY  WITH OR IMMEDIATELY FOLLOWING A  MEAL   multivitamin with minerals Tabs tablet Take 1 tablet by mouth daily.   pantoprazole 40 MG tablet Commonly known as: PROTONIX Take 1 tablet (40 mg total) by mouth every evening.   rosuvastatin 10 MG tablet Commonly known as: CRESTOR Take 1 tablet (10 mg total) by mouth daily.   triamcinolone 0.1 % paste Commonly known as: KENALOG SMARTSIG:Sparingly Topical 2-3 Times Daily   Vitamin D3 25 MCG (1000 UT) Caps Take 2 capsules (2,000 Units total) by mouth daily.         OBJECTIVE:   Vital Signs: Ht 5' (1.524 m)   Wt 171 lb (77.6 kg)   BMI 33.40 kg/m   Wt Readings from Last 3 Encounters:  04/15/23 171 lb (77.6 kg)  12/18/22 165 lb 9.6 oz (75.1 kg)  11/21/22 163 lb 9.6 oz (74.2 kg)     Exam: General: Pt appears well and is in NAD  Lungs: Clear with good BS bilat   Heart: RRR   Extremities: No pretibial edema.  Neuro: MS is good with appropriate affect, pt is alert and Ox3    DM foot exam:04/15/2023  The skin of the feet is intact without sores or ulcerations. The pedal pulses are 2+ on right and 2+ on left. The sensation is intact to a screening 5.07, 10 gram monofilament bilaterally        DATA REVIEWED:  Lab Results  Component Value Date   HGBA1C 7.3 (A) 04/15/2023   HGBA1C 7.7 (A) 11/21/2022   HGBA1C 7.6 (H) 11/17/2022    Latest Reference Range & Units 11/17/22 13:03   Sodium 135 - 145 mEq/L 140  Potassium 3.5 - 5.1 mEq/L 4.9  Chloride 96 - 112 mEq/L 105  CO2 19 - 32 mEq/L 25  Glucose 70 - 99 mg/dL 161 (H)  BUN 6 - 23 mg/dL 33 (H)  Creatinine 0.96 - 1.20 mg/dL 0.45 (H)  Calcium 8.4 - 10.5 mg/dL 9.6  Phosphorus 2.3 - 4.6 mg/dL 3.6  Alkaline Phosphatase 39 - 117 U/L 74  Albumin 3.5 - 5.2 g/dL 4.7  AST 0 - 37 U/L 16  ALT 0 - 35 U/L 18  Total Protein 6.0 - 8.3 g/dL 7.0  Total Bilirubin 0.2 - 1.2 mg/dL 0.5  GFR >40.98 mL/min 37.05 (L)    ASSESSMENT / PLAN / RECOMMENDATIONS:   1) Type 2 Diabetes Mellitus, Sub-Optimally controlled, With CKD III complications - Most recent A1c of 7.3 %. Goal A1c < 7.5 %.    -A1c is trending down -She is NOT a candidate for GLP-1 agonist, DPP 4 inhibitors nor Mounjaro due to history of pancreatitis -Limited glycemic agents due to CKD -Patient tends to take an additional 16 units of Humalog around 9 PM, patient endorses  hypoglycemia whether she snacks at bedtime or not, historically she does tend to snack at bedtime, I took a look at CGM download, last night her BG around 9 PM was 200 mg/DL, the patient did take 16 units of Humalog at 9 PM, fortunately she did not have any hypoglycemia after that and her BG's remained steady, but the patient did have a piece of cake, which helped her glucose -We did review her CGM, most of her hyperglycemia by 9 PM is due to postprandial hyperglycemia from supper, I will increase suppertime Humalog -She also tends to adjust her Lantus based on glucose reading, I have discouraged the patient from this practice, discussed pharmacokinetics of basal insulin, I will increase it as below -No changes to correction scale at this time -Not a candidate for SGLT2 inhibitors due to history of recurrent UTIs   MEDICATIONS: Take Lantus 26 units daily Change Humalog 18 units with breakfast, 18 units with lunch, and 20 units with supper Continue Humalog 10-16 units with bedtime snack Continue   correction factor: Humalog (BG -130/30)  EDUCATION / INSTRUCTIONS: BG monitoring instructions: Patient is instructed to check her blood sugars 4 times a day, before each meal ad bedtime . Call [facility] if: BG persistently < 70  I reviewed the Rule of 15 for the treatment of hypoglycemia in detail with the patient. Literature supplied.     2) Diabetic complications:  Eye: Does not have known diabetic retinopathy.  Neuro/ Feet: Does not have known diabetic peripheral neuropathy .  Renal: Patient does  have known baseline CKD. She   is  on an ACEI/ARB at present.      F/U in 6 months   Signed electronically by: Lyndle Herrlich, MD  [facility] Endocrinology  [facility] [address] [place] [place], [place] [address] Phone: [phone] FAX: [phone]   CC: Eustaquio Boyden, MD [address] [place] [address] Phone: [phone]  Fax: [phone]  Return to Endocrinology clinic as below: Future Appointments  Date Time Provider Department Center  04/15/2023 10:50 AM Ethelyne Erich, Konrad Dolores, MD [facility] None  05/18/2023 11:00 AM Eustaquio Boyden, MD [facility]  10/12/2023 10:45 AM [facility] ANNUAL WELLNESS VISIT 1 [facility]

## 2023-04-16 ENCOUNTER — Ambulatory Visit (INDEPENDENT_AMBULATORY_CARE_PROVIDER_SITE_OTHER): Payer: Medicare Other | Admitting: Urology

## 2023-04-16 ENCOUNTER — Encounter: Payer: Self-pay | Admitting: Urology

## 2023-04-16 VITALS — BP 135/78 | HR 70 | Ht 61.0 in | Wt 171.4 lb

## 2023-04-16 DIAGNOSIS — R300 Dysuria: Secondary | ICD-10-CM

## 2023-04-16 DIAGNOSIS — N952 Postmenopausal atrophic vaginitis: Secondary | ICD-10-CM | POA: Diagnosis not present

## 2023-04-16 LAB — URINALYSIS, COMPLETE
Bilirubin, UA: NEGATIVE
Glucose, UA: NEGATIVE
Ketones, UA: NEGATIVE
Nitrite, UA: POSITIVE — AB
Specific Gravity, UA: 1.01 (ref 1.005–1.030)
Urobilinogen, Ur: 0.2 mg/dL (ref 0.2–1.0)
pH, UA: 5 (ref 5.0–7.5)

## 2023-04-16 LAB — MICROSCOPIC EXAMINATION

## 2023-04-16 MED ORDER — CIPROFLOXACIN HCL 250 MG PO TABS
250.0000 mg | ORAL_TABLET | Freq: Two times a day (BID) | ORAL | 0 refills | Status: DC
Start: 2023-04-16 — End: 2023-04-30

## 2023-04-16 NOTE — Progress Notes (Signed)
04/16/2023 10:44 AM   [name] [birth year] [id number]  Referring provider: Eustaquio Boyden, MD [address] [place],  [place] [address]  Urological history: 1. rUTI's -Contributing factors of age, GSM, diabetes, -Documented urine cultures over the last year  None   Chief Complaint  Patient presents with   Dysuria    HPI: Katherine Oliver is a 77 y.o. female who presents today for possible UTI  Previous records reviewed.   Yesterday started with symptoms of frequency, urgency and dysuria.  She was voiding every 20 minutes during the night.  She had diarrhea couple days prior.  She has taken AZO.  She is applying the vaginal estrogen cream every other day, taking cranberry tablets and d-mannose.  She has allergies to azithromycin, sulfa antibiotics and Keflex.  She states Cipro is the only antibiotic that works for her.  Patient denies any modifying or aggravating factors.  Patient denies any gross hematuria or suprapubic/flank pain.  Patient denies any fevers, chills, nausea or vomiting.    UA orange clear, specific gravity 1.010, trace blood, pH 5.0, 2+ protein, nitrate positive, trace leukocytes, 11-30 WBCs, 0-2 RBCs, 0-2 epithelial cells and many bacteria.  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 nephropathy (HCC)    DSME at [facility] 01/2016    COVID-19 virus infection 11/10/2022   DKA (diabetic ketoacidoses) 04/22/2020   Frequent epistaxis 05/16/2019   S/p cauterization with resolution 2020   GERD (gastroesophageal reflux disease)    Hepatic steatosis    by abd [place] 05/2012, mild transaminitis - normal iron sat and viral hep panel (2011), stable [place] 2017   History of chicken pox    History of measles    History of recurrent UTIs    on  chronic keflex   HLD (hyperlipidemia)    HTN (hypertension)    Hypertensive retinopathy of both eyes, 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   Bulakowski   Kidney cyst, acquired 01/2016   L kidney by US   Kidney stone 01/2016   L kidney by [place]   Lung nodules 11/2013   overall stable on f/u CT 01/2016   Osteopenia 06/2013   mild, forearm T -1.1, hip and spine WNL   Pancreatitis    Polycythemia    mild, stable (2013)   Primary localized osteoarthritis of right knee 01/05/2019   Rosacea    metrogel    Surgical History: Past Surgical History:  Procedure Laterality Date   APPENDECTOMY  1987   BILIARY STENT PLACEMENT  12/14/2019   Procedure: BILIARY STENT PLACEMENT;  Surgeon: Jeani Hawking, MD;  Location: [facility] ENDOSCOPY;  Service: Endoscopy;;   BREAST BIOPSY Right 1963   benign   BREAST BIOPSY Right 08/2014   benign- core   cardiolite stress test  04/2004   normal   CESAREAN SECTION  [id number];2952   x2   CHOLECYSTECTOMY  2003   COLONOSCOPY  09/26/2008   adenomatous polyp, rpt 3-5 yrs   COLONOSCOPY  08/2012   adenomatous polyps, diverticulosis, rec rpt 5 yrs Marva Panda)   COLONOSCOPY WITH PROPOFOL N/A 02/05/2018   4TA, SSA, diverticulosis, rpt 3 yrs Marva Panda, Cindra Eves, MD)   COLONOSCOPY WITH PROPOFOL N/A 10/14/2022   fair prep, TAx2 one was 1cm in size, diverticulosis, rpt  1 yr given fair prep Mia Creek, Rossie Muskrat, MD)   dexa  2003   normal   dexa  06/2013   [facility] - Tscore -1.1 forearm, normal spine and femur   ERCP N/A 12/14/2019   Procedure: ENDOSCOPIC RETROGRADE CHOLANGIOPANCREATOGRAPHY (ERCP);  Surgeon: Jeani Hawking, MD;  Location: [facility] ENDOSCOPY;  Service: Endoscopy;  Laterality: N/A;   ERCP  01/2020   nonbleeding gastric ulcer - [facility] hospitalization (Dr Shana Chute)   ESOPHAGOGASTRODUODENOSCOPY N/A 12/19/2019   Procedure: ESOPHAGOGASTRODUODENOSCOPY (EGD);  Surgeon: Charna Elizabeth, MD;  Location: [facility] ENDOSCOPY;  Service: Endoscopy;  Laterality: N/A;   ESOPHAGOGASTRODUODENOSCOPY  01/2020   pre existing  AXIOS cystogastrostomy stent s/p necrosectomy - [facility] hospitalization (Dr Shana Chute)   ESOPHAGOGASTRODUODENOSCOPY N/A 04/24/2020   Procedure: ESOPHAGOGASTRODUODENOSCOPY (EGD);  Surgeon: Graylin Shiver, MD;  Location: [facility] ENDOSCOPY;  Service: Endoscopy;  Laterality: N/A;   ESOPHAGOGASTRODUODENOSCOPY  05/2020   normal esophagus, duodenum, erythematous mucosa in gastric body, gastric cystogastrostomy stent removed ([facility])   IR FLUORO GUIDE CV LINE RIGHT  12/27/2019   IR FLUORO GUIDE CV LINE RIGHT  04/24/2020   IR IVC FILTER PLMT / S&I /IMG GUID/MOD SED  04/26/2020   IR IVC FILTER RETRIEVAL / S&I /IMG GUID/MOD SED  07/31/2021   IR RADIOLOGIST EVAL & MGMT  07/31/2020   IR RADIOLOGIST EVAL & MGMT  01/15/2021   IR RADIOLOGIST EVAL & MGMT  06/19/2021   IR REMOVAL TUN CV CATH W/O FL  01/03/2020   IR REMOVAL TUN CV CATH W/O FL  04/29/2020   IR REPLC DUODEN/JEJUNO TUBE PERCUT W/FLUORO  04/01/2020   IR US GUIDE VASC ACCESS RIGHT  04/24/2020   SPHINCTEROTOMY  12/14/2019   Procedure: SPHINCTEROTOMY;  Surgeon: Jeani Hawking, MD;  Location: [facility] ENDOSCOPY;  Service: Endoscopy;;   TOTAL KNEE ARTHROPLASTY Right 01/17/2019   Procedure: TOTAL KNEE ARTHROPLASTY;  Surgeon: Salvatore Marvel, MD;  Location: [facility] ORS;  Service: Orthopedics;  Laterality: Right;   TRANSTHORACIC ECHOCARDIOGRAM  04/2019   EF 55-60%, modLVH, impaired relaxation    TRIGGER FINGER RELEASE  2007;2010;2011   bilateral   TRIGGER FINGER RELEASE  02/2017   VAGINAL HYSTERECTOMY  1984   for menorrhagia, ovaries in place    Home Medications:  Allergies as of 04/16/2023       Reactions   Azithromycin Itching   Okay if takes benadryl along with it   Nickel    Reaction to cheap earrings cause redness   Sulfa Antibiotics Itching   Vinegar [acetic Acid] Nausea Only   Adhesive [tape] Rash   Paper tape only causes blisters   Keflex [cephalexin] Rash        Medication List        Accurate as of April 16, 2023 10:44 AM. If you have any  questions, ask your nurse or doctor.          Accu-Chek Aviva Plus test strip Generic drug: glucose blood 1 each by Other route 4 (four) times daily. And lancets 4/day   acetaminophen 500 MG tablet Commonly known as: TYLENOL Take 500 mg by mouth 2 (two) times daily.   amLODipine 10 MG tablet Commonly known as: NORVASC Take 1 tablet (10 mg total) by mouth daily.   amoxicillin 500 MG capsule Commonly known as: AMOXIL Take 2,000 mg by mouth See admin instructions. Take 2000 mg 1 hour prior to dental work   B-D UF III MINI PEN NEEDLES 31G X 5 MM Misc Generic drug: Insulin Pen Needle Inject 1 Device into the skin 4 (  four) times daily.   Baqsimi One Pack 3 MG/DOSE Powd Generic drug: Glucagon Place 3 mg into the nose daily as needed (hypoglycemia).   benazepril-hydrochlorthiazide 20-12.5 MG tablet Commonly known as: LOTENSIN HCT Take 0.5 tablets by mouth daily.   ciprofloxacin 250 MG tablet Commonly known as: Cipro Take 1 tablet (250 mg total) by mouth 2 (two) times daily.   CRANBERRY PO Take 4,200 mg by mouth 2 (two) times daily.   CRANBERRY-VITAMIN C-D MANNOSE PO Take 1,500 mg by mouth daily.   Creon 36000 UNITS Cpep capsule Generic drug: lipase/protease/amylase TAKE 2 CAPSULES BY MOUTH 3 TIMES DAILY WITH MEALS   D-MANNOSE PO Take 2 tablets by mouth daily with lunch.   Dexcom G6 Sensor Misc 1 Device by Other route See admin instructions. Change every 10 days   diphenhydrAMINE 25 MG tablet Commonly known as: BENADRYL Take 25 mg by mouth 2 (two) times daily as needed for allergies.   estradiol 0.1 MG/GM vaginal cream Commonly known as: ESTRACE (DISCARD APPLICATOR) APPLY PEA SIZED AMOUNT TO TIP OF FINGER TO URETHRA BEFORE BED. WASH HANDS WELL AFTER APPLICATION. USE MONDAY, WEDNESDAY AND FRIDAY   ferrous sulfate 325 (65 FE) MG tablet Take 1 tablet (325 mg total) by mouth every other day.   fluticasone 50 MCG/ACT nasal spray Commonly known as: FLONASE Place 1  spray into both nostrils daily.   folic acid 1 MG tablet Commonly known as: FOLVITE Take 1 tablet (1 mg total) by mouth daily.   HumaLOG KwikPen 200 UNIT/ML KwikPen Generic drug: insulin lispro Max daily 100 units   Lantus SoloStar 100 UNIT/ML Solostar Pen Generic drug: insulin glargine Inject 28 Units into the skin at bedtime.   insulin glargine 100 UNIT/ML Solostar Pen Commonly known as: LANTUS Inject 26 Units into the skin at bedtime.   loperamide 2 MG tablet Commonly known as: IMODIUM A-D Take 4 mg by mouth 4 (four) times daily as needed for diarrhea or loose stools.   meclizine 25 MG tablet Commonly known as: ANTIVERT Take 1 tablet (25 mg total) by mouth 3 (three) times daily as needed for nausea.   metoprolol succinate 50 MG 24 hr tablet Commonly known as: TOPROL-XL TAKE 1 TABLET BY MOUTH DAILY  WITH OR IMMEDIATELY FOLLOWING A  MEAL   multivitamin with minerals Tabs tablet Take 1 tablet by mouth daily.   pantoprazole 40 MG tablet Commonly known as: PROTONIX Take 1 tablet (40 mg total) by mouth every evening.   rosuvastatin 10 MG tablet Commonly known as: CRESTOR Take 1 tablet (10 mg total) by mouth daily.   triamcinolone 0.1 % paste Commonly known as: KENALOG SMARTSIG:Sparingly Topical 2-3 Times Daily   Vitamin D3 25 MCG (1000 UT) Caps Take 2 capsules (2,000 Units total) by mouth daily.        Allergies:  Allergies  Allergen Reactions   Azithromycin Itching    Okay if takes benadryl along with it   Nickel     Reaction to cheap earrings cause redness   Sulfa Antibiotics Itching   Vinegar [Acetic Acid] Nausea Only   Adhesive [Tape] Rash    Paper tape only causes blisters   Keflex [Cephalexin] Rash    Family History: Family History  Problem Relation Age of Onset   Stroke Mother        several   Hyperlipidemia Mother    Hypertension Mother    Cancer Father        colon   Hypertension Father    Hyperlipidemia  Father    Coronary artery  disease Father 9       MIx1, CABG   Cancer Paternal Aunt        abdominal   Coronary artery disease Maternal Grandmother    Diabetes Maternal Grandfather    Coronary artery disease Maternal Grandfather    Breast cancer Neg Hx     Social History:  reports that she has never smoked. She has never been exposed to tobacco smoke. She has never used smokeless tobacco. She reports that she does not currently use alcohol. She reports that she does not use drugs.  ROS: Pertinent ROS in HPI  Physical Exam: BP 135/78   Pulse 70   Ht 5\' 1"  (1.549 m)   Wt 171 lb 6 oz (77.7 kg)   BMI 32.38 kg/m   Constitutional:  Well nourished. Alert and oriented, No acute distress. HEENT: [place] AT, moist mucus membranes.  Trachea midline Cardiovascular: No clubbing, cyanosis, or edema. Respiratory: Normal respiratory effort, no increased work of breathing. Neurologic: Grossly intact, no focal deficits, moving all 4 extremities. Psychiatric: Normal mood and affect.  Laboratory Data: Lab Results  Component Value Date   WBC 7.8 11/17/2022   HGB 14.0 11/17/2022   HCT 42.7 11/17/2022   MCV 81.7 11/17/2022   PLT 161.0 11/17/2022    Lab Results  Component Value Date   CREATININE 1.37 (H) 11/17/2022    Lab Results  Component Value Date   HGBA1C 7.3 (A) 04/15/2023    Lab Results  Component Value Date   TSH 3.27 09/25/2020       Component Value Date/Time   CHOL 162 11/17/2022 1303   HDL 43.60 11/17/2022 1303   CHOLHDL 4 11/17/2022 1303   VLDL 68.6 (H) 11/17/2022 1303   LDLCALC 54 09/25/2020 0922    Lab Results  Component Value Date   AST 16 11/17/2022   Lab Results  Component Value Date   ALT 18 11/17/2022    Urinalysis  See EPIC and HPI I have reviewed the labs.   Pertinent Imaging: N/A  Assessment & Plan:    1. Dysuria - Urinalysis, Complete -nitrate positive, but is on AZO, leukocytes and bacteria - CULTURE, URINE COMPREHENSIVE -Symptomatic at this visit -Explained to  her that Cipro does carry a black box warning and it is not advised as first-line treatment for urinary tract infections, she understands these risks but states that is the only antibiotic that is ever worked for her -Cipro 250 mg BID x 7 days  2. Vaginal atrophy -continue the vaginal estrogen cream 3 nights weekly  Return if symptoms worsen or fail to improve.  These notes generated with voice recognition software. I apologize for typographical errors.  Cloretta Ned  [facility] [address]  [address] [place], [place] [address] [phone]

## 2023-04-21 LAB — CULTURE, URINE COMPREHENSIVE

## 2023-04-28 ENCOUNTER — Ambulatory Visit
Admission: EM | Admit: 2023-04-28 | Discharge: 2023-04-28 | Disposition: A | Discharge status: Home / Self Care | Payer: Medicare Other | Attending: Emergency Medicine | Admitting: Emergency Medicine

## 2023-04-28 DIAGNOSIS — R300 Dysuria: Secondary | ICD-10-CM | POA: Diagnosis not present

## 2023-04-28 LAB — POCT URINALYSIS DIP (MANUAL ENTRY)
Bilirubin, UA: NEGATIVE
Glucose, UA: NEGATIVE mg/dL
Ketones, POC UA: NEGATIVE mg/dL
Nitrite, UA: NEGATIVE
Protein Ur, POC: 100 mg/dL — AB
Spec Grav, UA: 1.01 (ref 1.010–1.025)
Urobilinogen, UA: 0.2 E.U./dL
pH, UA: 5 (ref 5.0–8.0)

## 2023-04-28 NOTE — Discharge Instructions (Addendum)
The urine culture is pending.  Follow up with your urologist.

## 2023-04-28 NOTE — ED Provider Notes (Signed)
[facility] Cower    CSN: [id number] Arrival date & time: 04/28/23  1043      History   Chief Complaint Chief Complaint  Patient presents with   Urinary Frequency    HPI Nioka Thorington is a 77 y.o. female.  Patient presents with dysuria, urinary frequency, urinary urgency since this morning.  No fever, abdominal pain, flank pain, or other symptoms.  Patient has history of recurrent UTI; she is followed by urology.  She was seen by urology on 04/16/2023; treated with Cipro x 7 days.  The history is provided by the patient and medical records.    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 nephropathy (HCC)    DSME at [facility] 01/2016    COVID-19 virus infection 11/10/2022   DKA (diabetic ketoacidoses) 04/22/2020   Frequent epistaxis 05/16/2019   S/p cauterization with resolution 2020   GERD (gastroesophageal reflux disease)    Hepatic steatosis    by abd [place] 05/2012, mild transaminitis - normal iron sat and viral hep panel (2011), stable [place] 2017   History of chicken pox    History of measles    History of recurrent UTIs    on chronic keflex   HLD (hyperlipidemia)    HTN (hypertension)    Hypertensive retinopathy of both eyes, 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   Bulakowski   Kidney cyst, acquired 01/2016   L kidney by US   Kidney stone 01/2016   L kidney by [place]   Lung nodules 11/2013   overall stable on f/u CT 01/2016   Osteopenia 06/2013   mild, forearm T -1.1, hip and spine WNL   Pancreatitis    Polycythemia    mild, stable (2013)   Primary localized osteoarthritis of right knee 01/05/2019   Rosacea    metrogel    Patient Active Problem List   Diagnosis Date Noted   Type 2 diabetes mellitus with stage 3b chronic kidney disease, with long-term current use of insulin (HCC)  11/21/2022   Type 2 diabetes mellitus with hyperglycemia, with long-term current use of insulin (HCC) 11/21/2022   Pancreatic insufficiency 10/04/2021   Entropion of both lower eyelids 04/03/2021   Pain involving joint of finger of left hand 10/02/2020   Clostridioides difficile infection 06/05/2020   Hair loss 06/05/2020   Right femoral vein DVT (HCC) 05/03/2020   Hx of epistaxis    Aortic atherosclerosis (HCC)    Diabetes mellitus type 2, insulin dependent (HCC) 04/17/2020   HTN (hypertension) 01/15/2020   Iron deficiency anemia 01/15/2020   Pancreatic pseudocyst 01/15/2020   Chronic pancreatitis (HCC) 01/12/2020   Acquired renal cyst of left kidney 01/06/2020   Gastric stress ulcer 01/06/2020   Necrotizing pancreatitis    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HCC)    History of pancreatitis 12/13/2019   Chronic diarrhea 06/01/2019   Primary localized osteoarthritis of right knee 01/05/2019   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   Hx of adenomatous polyp of colon 12/15/2017   Vulvar dermatitis 12/07/2017   Right hip pain 08/07/2016   CKD stage 3 due to type 2 diabetes mellitus (HCC) 04/04/2016   Pulmonary nodules 01/03/2016   Advanced care planning/counseling discussion 07/05/2015   Obesity, Class I, BMI 30-34.9 07/05/2015   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   Osteopenia 06/03/2013   Medicare annual wellness visit, subsequent 05/31/2012   Rotator cuff tendonitis, right 03/01/2012   Polycythemia 12/02/2011   Controlled diabetes mellitus type 2 with complications (HCC)    Accelerated hypertension    Dyslipidemia associated with type 2 diabetes mellitus (HCC)    History of recurrent UTIs    Allergic rhinitis    GERD (gastroesophageal reflux disease)    Rosacea    NAFLD (nonalcoholic fatty liver disease) 74/25/9563    Past Surgical History:  Procedure Laterality Date   APPENDECTOMY  1987   BILIARY STENT PLACEMENT  12/14/2019   Procedure: BILIARY  STENT PLACEMENT;  Surgeon: Jeani Hawking, MD;  Location: [facility] ENDOSCOPY;  Service: Endoscopy;;   BREAST BIOPSY Right 1963   benign   BREAST BIOPSY Right 08/2014   benign- core   cardiolite stress test  04/2004   normal   CESAREAN SECTION  8756;4332   x2   CHOLECYSTECTOMY  2003   COLONOSCOPY  09/26/2008   adenomatous polyp, rpt 3-5 yrs   COLONOSCOPY  08/2012   adenomatous polyps, diverticulosis, rec rpt 5 yrs Marva Panda)   COLONOSCOPY WITH PROPOFOL N/A 02/05/2018   4TA, SSA, diverticulosis, rpt 3 yrs Marva Panda, Cindra Eves, MD)   COLONOSCOPY WITH PROPOFOL N/A 10/14/2022   fair prep, TAx2 one was 1cm in size, diverticulosis, rpt 1 yr given fair prep Mia Creek, Rossie Muskrat, MD)   dexa  2003   normal   dexa  06/2013   [facility] - Tscore -1.1 forearm, normal spine and femur   ERCP N/A 12/14/2019   Procedure: ENDOSCOPIC RETROGRADE CHOLANGIOPANCREATOGRAPHY (ERCP);  Surgeon: Jeani Hawking, MD;  Location: [facility] ENDOSCOPY;  Service: Endoscopy;  Laterality: N/A;   ERCP  01/2020   nonbleeding gastric ulcer - [facility] hospitalization (Dr Shana Chute)   ESOPHAGOGASTRODUODENOSCOPY N/A 12/19/2019   Procedure: ESOPHAGOGASTRODUODENOSCOPY (EGD);  Surgeon: Charna Elizabeth, MD;  Location[facility] ENDOSCOPY;  Service: Endoscopy;  Laterality: N/A;   ESOPHAGOGASTRODUODENOSCOPY  01/2020   pre existing AXIOS cystogastrostomy stent s/p necrosectomy - [facility] hospitalization (Dr Shana Chute)   ESOPHAGOGASTRODUODENOSCOPY N/A 04/24/2020   Procedure: ESOPHAGOGASTRODUODENOSCOPY (EGD);  Surgeon: Graylin Shiver, MD;  Location: [facility] ENDOSCOPY;  Service: Endoscopy;  Laterality: N/A;   ESOPHAGOGASTRODUODENOSCOPY  05/2020   normal esophagus, duodenum, erythematous mucosa in gastric body, gastric cystogastrostomy stent removed ([facility])   IR FLUORO GUIDE CV LINE RIGHT  12/27/2019   IR FLUORO GUIDE CV LINE RIGHT  04/24/2020   IR IVC FILTER PLMT / S&I /IMG GUID/MOD SED  04/26/2020   IR IVC FILTER RETRIEVAL / S&I /IMG GUID/MOD SED  07/31/2021   IR RADIOLOGIST  EVAL & MGMT  07/31/2020   IR RADIOLOGIST EVAL & MGMT  01/15/2021   IR RADIOLOGIST EVAL & MGMT  06/19/2021   IR REMOVAL TUN CV CATH W/O FL  01/03/2020   IR REMOVAL TUN CV CATH W/O FL  04/29/2020   IR REPLC DUODEN/JEJUNO TUBE PERCUT W/FLUORO  04/01/2020   IR US GUIDE VASC ACCESS RIGHT  04/24/2020   SPHINCTEROTOMY  12/14/2019   Procedure: SPHINCTEROTOMY;  Surgeon: Jeani Hawking, MD;  Location[facility] ENDOSCOPY;  Service: Endoscopy;;   TOTAL KNEE ARTHROPLASTY Right 01/17/2019   Procedure: TOTAL KNEE ARTHROPLASTY;  Surgeon: Salvatore Marvel, MD;  Location: [facility] ORS;  Service: Orthopedics;  Laterality: Right;   TRANSTHORACIC ECHOCARDIOGRAM  04/2019   EF 55-60%, modLVH, impaired relaxation    TRIGGER FINGER RELEASE  2007;2010;2011   bilateral   TRIGGER FINGER RELEASE  02/2017   VAGINAL HYSTERECTOMY  1984  for menorrhagia, ovaries in place    OB History   No obstetric history on file.      Home Medications    Prior to Admission medications   Medication Sig Start Date End Date Taking? Authorizing Provider  acetaminophen (TYLENOL) 500 MG tablet Take 500 mg by mouth 2 (two) times daily.    [provider]  amLODipine (NORVASC) 10 MG tablet Take 1 tablet (10 mg total) by mouth daily. 11/17/22   Eustaquio Boyden, MD  amoxicillin (AMOXIL) 500 MG capsule Take 2,000 mg by mouth See admin instructions. Take 2000 mg 1 hour prior to dental work Patient not taking: Reported on 04/28/2023    [provider]  benazepril-hydrochlorthiazide (LOTENSIN HCT) 20-12.5 MG tablet Take 0.5 tablets by mouth daily. 11/17/22   Eustaquio Boyden, MD  Cholecalciferol (VITAMIN D3) 25 MCG (1000 UT) CAPS Take 2 capsules (2,000 Units total) by mouth daily. 10/09/21   Eustaquio Boyden, MD  ciprofloxacin (CIPRO) 250 MG tablet Take 1 tablet (250 mg total) by mouth 2 (two) times daily. Patient not taking: Reported on 04/28/2023 04/16/23   Michiel Cowboy A, PA-C  Continuous Blood Gluc Sensor (DEXCOM G6 SENSOR) MISC  1 Device by Other route See admin instructions. Change every 10 days 11/25/21   Romero Belling, MD  CRANBERRY PO Take 4,200 mg by mouth 2 (two) times daily.    [provider]  CRANBERRY-VITAMIN C-D MANNOSE PO Take 1,500 mg by mouth daily.    [provider]  CREON 32440-102725 units CPEP capsule TAKE 2 CAPSULES BY MOUTH 3 TIMES DAILY WITH MEALS 12/02/22   Eustaquio Boyden, MD  D-MANNOSE PO Take 2 tablets by mouth daily with lunch.    [provider]  diphenhydrAMINE (BENADRYL) 25 MG tablet Take 25 mg by mouth 2 (two) times daily as needed for allergies.    [provider]  estradiol (ESTRACE) 0.1 MG/GM vaginal cream (DISCARD APPLICATOR) APPLY PEA SIZED AMOUNT TO TIP OF FINGER TO URETHRA BEFORE BED. WASH HANDS WELL AFTER APPLICATION. USE MONDAY, [facility] AND FRIDAY 01/08/23   Sondra Come, MD  ferrous sulfate 325 (65 FE) MG tablet Take 1 tablet (325 mg total) by mouth every other day. 11/20/22   Eustaquio Boyden, MD  fluticasone [facility]) 50 MCG/ACT nasal spray Place 1 spray into both nostrils daily. 11/17/22   Eustaquio Boyden, MD  folic acid (FOLVITE) 1 MG tablet Take 1 tablet (1 mg total) by mouth daily. 11/17/22   Eustaquio Boyden, MD  Glucagon (BAQSIMI ONE PACK) 3 MG/DOSE POWD Place 3 mg into the nose daily as needed (hypoglycemia).    [provider]  glucose blood (ACCU-CHEK AVIVA PLUS) test strip 1 each by Other route 4 (four) times daily. And lancets 4/day 11/25/21   Romero Belling, MD  insulin glargine (LANTUS SOLOSTAR) 100 UNIT/ML Solostar Pen Inject 28 Units into the skin at bedtime. 04/15/23   Shamleffer, Konrad Dolores, MD  insulin glargine (LANTUS) 100 UNIT/ML Solostar Pen Inject 26 Units into the skin at bedtime. 04/15/23   Shamleffer, Konrad Dolores, MD  insulin lispro (HUMALOG KWIKPEN) 200 UNIT/ML KwikPen Max daily 100 units 04/15/23   Shamleffer, Konrad Dolores, MD  Insulin Pen Needle (B-D UF III MINI PEN NEEDLES) 31G X 5 MM MISC Inject 1  Device into the skin 4 (four) times daily. 04/15/23   Shamleffer, Konrad Dolores, MD  loperamide (IMODIUM A-D) 2 MG tablet Take 4 mg by mouth 4 (four) times daily as needed for diarrhea or loose stools.  [provider]  meclizine (ANTIVERT) 25 MG tablet Take 1 tablet (25 mg total) by mouth 3 (three) times daily as needed for nausea. 11/17/22   Eustaquio Boyden, MD  metoprolol succinate (TOPROL-XL) 50 MG 24 hr tablet TAKE 1 TABLET BY MOUTH DAILY  WITH OR IMMEDIATELY FOLLOWING A  MEAL 11/17/22   Eustaquio Boyden, MD  Multiple Vitamin (MULTIVITAMIN WITH MINERALS) TABS tablet Take 1 tablet by mouth daily.    [provider]  pantoprazole (PROTONIX) 40 MG tablet Take 1 tablet (40 mg total) by mouth every evening. 11/17/22   Eustaquio Boyden, MD  rosuvastatin (CRESTOR) 10 MG tablet Take 1 tablet (10 mg total) by mouth daily. 11/17/22   Eustaquio Boyden, MD  triamcinolone (KENALOG) 0.1 % paste SMARTSIG:Sparingly Topical 2-3 Times Daily 11/17/22   Eustaquio Boyden, MD    Family History Family History  Problem Relation Age of Onset   Stroke Mother        several   Hyperlipidemia Mother    Hypertension Mother    Cancer Father        colon   Hypertension Father    Hyperlipidemia Father    Coronary artery disease Father 32       MIx1, CABG   Cancer Paternal Aunt        abdominal   Coronary artery disease Maternal Grandmother    Diabetes Maternal Grandfather    Coronary artery disease Maternal Grandfather    Breast cancer Neg Hx     Social History Social History   Tobacco Use   Smoking status: Never    Passive exposure: Never   Smokeless tobacco: Never  Vaping Use   Vaping Use: Never used  Substance Use Topics   Alcohol use: Not Currently   Drug use: No     Allergies   Azithromycin, Nickel, Sulfa antibiotics, Vinegar [acetic acid], Adhesive [tape], and Keflex [cephalexin]   Review of Systems Review of Systems  Constitutional:  Negative for chills and fever.   Gastrointestinal:  Negative for abdominal pain, nausea and vomiting.  Genitourinary:  Positive for dysuria, frequency and urgency. Negative for flank pain and hematuria.     Physical Exam Triage Vital Signs ED Triage Vitals  Enc Vitals Group     BP      Pulse      Resp      Temp      Temp src      SpO2      Weight      Height      Head Circumference      Peak Flow      Pain Score      Pain Loc      Pain Edu?      Excl. in GC?    No data found.  Updated Vital Signs BP (!) 148/78   Pulse 68   Temp 97.8 F (36.6 C)   Resp 16   SpO2 95%   Visual Acuity Right Eye Distance:   Left Eye Distance:   Bilateral Distance:    Right Eye Near:   Left Eye Near:    Bilateral Near:     Physical Exam Vitals and nursing note reviewed.  Constitutional:      General: She is not in acute distress.    Appearance: She is well-developed.  HENT:     Mouth/Throat:     Mouth: Mucous membranes are moist.  Cardiovascular:     Rate and Rhythm: Normal rate and regular rhythm.  Heart sounds: Normal heart sounds.  Pulmonary:     Effort: Pulmonary effort is normal. No respiratory distress.     Breath sounds: Normal breath sounds.  Abdominal:     General: Bowel sounds are normal.     Palpations: Abdomen is soft.     Tenderness: There is no abdominal tenderness. There is no right CVA tenderness, left CVA tenderness, guarding or rebound.  Musculoskeletal:     Cervical back: Neck supple.  Skin:    General: Skin is warm and dry.  Neurological:     Mental Status: She is alert.  Psychiatric:        Mood and Affect: Mood normal.        Behavior: Behavior normal.      UC Treatments / Results  Labs (all labs ordered are listed, but only abnormal results are displayed) Labs Reviewed  POCT URINALYSIS DIP (MANUAL ENTRY) - Abnormal; Notable for the following components:      Result Value   Clarity, UA cloudy (*)    Blood, UA moderate (*)    Protein Ur, POC =100 (*)     Leukocytes, UA Small (1+) (*)    All other components within normal limits  URINE CULTURE    EKG   Radiology No results found.  Procedures Procedures (including critical care time)  Medications Ordered in UC Medications - No data to display  Initial Impression / Assessment and Plan / UC Course  I have reviewed the triage vital signs and the nursing notes.  Pertinent labs & imaging results that were available during my care of the patient were reviewed by me and considered in my medical decision making (see chart for details).    Dysuria.  Patient just completed a 7-day course of Cipro that was prescribed by her urologist on 04/16/2023.  Her urine today has small leukocytes and moderate RBC; negative nitrite.  Urine culture pending.  Discussed we will call her if the urine culture shows the need for treatment.  Instructed her to follow-up with her urologist.  She states they have an appointment available on 04/30/2023 and she will follow-up then.  Education provided on dysuria.  Patient agrees to plan of care.  Final Clinical Impressions(s) / UC Diagnoses   Final diagnoses:  Dysuria     Discharge Instructions      The urine culture is pending.  Follow up with your urologist.        ED Prescriptions   None    PDMP not reviewed this encounter.   Mickie Bail, NP 04/28/23 1115

## 2023-04-28 NOTE — ED Triage Notes (Signed)
Patient to Urgent Care with complaints of urinary frequency/ dysuria/ urgency that started today.   Recently completed one week course of Cipro for a UTI (finished on Thursday).

## 2023-04-30 ENCOUNTER — Telehealth (HOSPITAL_COMMUNITY): Payer: Self-pay | Admitting: Emergency Medicine

## 2023-04-30 LAB — URINE CULTURE: Culture: >=100000 — AB

## 2023-04-30 MED ORDER — AMOXICILLIN-POT CLAVULANATE 875-125 MG PO TABS: 1.0000 | ORAL_TABLET | Freq: Two times a day (BID) | ORAL | 0 refills | Status: DC

## 2023-05-01 ENCOUNTER — Ambulatory Visit (INDEPENDENT_AMBULATORY_CARE_PROVIDER_SITE_OTHER): Payer: Medicare Other | Admitting: Physician Assistant

## 2023-05-01 VITALS — BP 134/80 | HR 70 | Ht 61.0 in | Wt 171.0 lb

## 2023-05-01 DIAGNOSIS — N390 Urinary tract infection, site not specified: Secondary | ICD-10-CM

## 2023-05-01 DIAGNOSIS — R300 Dysuria: Secondary | ICD-10-CM | POA: Diagnosis not present

## 2023-05-01 LAB — URINALYSIS, COMPLETE
Bilirubin, UA: NEGATIVE
Ketones, UA: NEGATIVE
Nitrite, UA: POSITIVE — AB
Specific Gravity, UA: <1.005 — ABNORMAL LOW (ref 1.005–1.030)
Urobilinogen, Ur: 1 mg/dL (ref 0.2–1.0)
pH, UA: 5 (ref 5.0–7.5)

## 2023-05-01 LAB — MICROSCOPIC EXAMINATION
Epithelial Cells (non renal): >10 /hpf — AB (ref 0–10)
WBC, UA: >30 /hpf — AB (ref 0–5)

## 2023-05-01 MED ORDER — CEFTRIAXONE SODIUM 1 G IJ SOLR
1.0000 g | Freq: Once | INTRAMUSCULAR | Status: AC
Start: 2023-05-01 — End: 2023-05-01
  Administered 2023-05-01: 1 g via INTRAMUSCULAR

## 2023-05-01 MED ORDER — CEFDINIR 300 MG PO CAPS
300.0000 mg | ORAL_CAPSULE | Freq: Two times a day (BID) | ORAL | 0 refills | Status: AC
Start: 2023-05-01 — End: 2023-05-10

## 2023-05-01 NOTE — Progress Notes (Signed)
IM Injection  Patient is present today for an IM Injection for treatment of rocephin Drug: rocephin Dose:1 gram, 2.1 ml 1% lidocaine  Location:[facility] Lot: [id number] Exp:01/01/2025 Patient tolerated well, no complications were noted  Performed by: Randa Lynn, RMA  Additional notes/ Follow up: PRN

## 2023-05-01 NOTE — Progress Notes (Signed)
05/01/2023 1:34 PM   Katherine Oliver 07-Apr-1946 [id number]  CC: Chief Complaint  Patient presents with   Follow-up   HPI: Katherine Oliver is a 77 y.o. female with PMH diabetes and recurrent UTI who presents today for evaluation of recurrent versus persistent UTI.   She saw Carollee Herter in clinic 15 days ago with reports of 1 day of frequency, urgency, and dysuria.  UA was notable for pyuria and bacteriuria and she was started on empiric Cipro per patient preference.  Urine culture grew ampicillin and Macrobid resistant Klebsiella pneumoniae.  She was seen at urgent care 3 days ago with reports of same-day recurrence of dysuria, urgency, and frequency.  Urine culture once again grew ampicillin and Macrobid resistant Klebsiella pneumoniae. She was prescribed Augmentin, but has not taken it.  Today she reports ongoing dysuria, urgency, and frequency. This would represent her first UTI in over a year. She reports chills and nausea. No fever, vomiting, flank pain, or gross hematuria. Symptoms resolved on Cipro and returned 2-3 days after completing the rx. She has been taking Azo, most recently this morning.  In-office UA today positive for trace glucose, trace intact blood, 2+ protein, nitrites, and 1+ leukocytes; urine microscopy with >30 WBCs/HPF, >10 epithelial cells/hpf, and many bacteria.  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 nephropathy (HCC)    DSME at [facility] 01/2016    COVID-19 virus infection 11/10/2022   DKA (diabetic ketoacidoses) 04/22/2020   Frequent epistaxis 05/16/2019   S/p cauterization with resolution 2020   GERD (gastroesophageal reflux disease)    Hepatic steatosis    by abd [place] 05/2012, mild transaminitis - normal iron sat and viral hep panel  (2011), stable [place] 2017   History of chicken pox    History of measles    History of recurrent UTIs    on chronic keflex   HLD (hyperlipidemia)    HTN (hypertension)    Hypertensive retinopathy of both eyes, 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   [facility]   Kidney cyst, acquired 01/2016   L kidney by US   Kidney stone 01/2016   L kidney by [place]   Lung nodules 11/2013   overall stable on f/u CT 01/2016   Osteopenia 06/2013   mild, forearm T -1.1, hip and spine WNL   Pancreatitis    Polycythemia    mild, stable (2013)   Primary localized osteoarthritis of right knee 01/05/2019   Rosacea    metrogel    Surgical History: Past Surgical History:  Procedure Laterality Date   APPENDECTOMY  1987   BILIARY STENT PLACEMENT  12/14/2019   Procedure: BILIARY STENT PLACEMENT;  Surgeon: Jeani Hawking, MD;  Location: [facility] ENDOSCOPY;  Service: Endoscopy;;   BREAST BIOPSY Right 1963   benign   BREAST BIOPSY Right 08/2014   benign- core   cardiolite stress test  04/2004   normal   CESAREAN SECTION  4098;1191   x2   CHOLECYSTECTOMY  2003   COLONOSCOPY  09/26/2008   adenomatous polyp, rpt 3-5 yrs   COLONOSCOPY  08/2012   adenomatous polyps, diverticulosis, rec rpt 5 yrs Marva Panda)   COLONOSCOPY WITH PROPOFOL N/A 02/05/2018   4TA, SSA, diverticulosis, rpt 3 yrs Marva Panda, Cindra Eves, MD)   COLONOSCOPY WITH PROPOFOL N/A  10/14/2022   fair prep, TAx2 one was 1cm in size, diverticulosis, rpt 1 yr given fair prep Mia Creek, Rossie Muskrat, MD)   dexa  2003   normal   dexa  06/2013   [facility] - Tscore -1.1 forearm, normal spine and femur   ERCP N/A 12/14/2019   Procedure: ENDOSCOPIC RETROGRADE CHOLANGIOPANCREATOGRAPHY (ERCP);  Surgeon: Jeani Hawking, MD;  Location: [facility] ENDOSCOPY;  Service: Endoscopy;  Laterality: N/A;   ERCP  01/2020   nonbleeding gastric ulcer - [facility] hospitalization (Dr Shana Chute)   ESOPHAGOGASTRODUODENOSCOPY N/A 12/19/2019   Procedure: ESOPHAGOGASTRODUODENOSCOPY (EGD);  Surgeon: Charna Elizabeth, MD;   Location: [facility] ENDOSCOPY;  Service: Endoscopy;  Laterality: N/A;   ESOPHAGOGASTRODUODENOSCOPY  01/2020   pre existing AXIOS cystogastrostomy stent s/p necrosectomy - [facility] hospitalization (Dr Shana Chute)   ESOPHAGOGASTRODUODENOSCOPY N/A 04/24/2020   Procedure: ESOPHAGOGASTRODUODENOSCOPY (EGD);  Surgeon: Graylin Shiver, MD;  Location: [facility] ENDOSCOPY;  Service: Endoscopy;  Laterality: N/A;   ESOPHAGOGASTRODUODENOSCOPY  05/2020   normal esophagus, duodenum, erythematous mucosa in gastric body, gastric cystogastrostomy stent removed ([facility])   IR FLUORO GUIDE CV LINE RIGHT  12/27/2019   IR FLUORO GUIDE CV LINE RIGHT  04/24/2020   IR IVC FILTER PLMT / S&I /IMG GUID/MOD SED  04/26/2020   IR IVC FILTER RETRIEVAL / S&I /IMG GUID/MOD SED  07/31/2021   IR RADIOLOGIST EVAL & MGMT  07/31/2020   IR RADIOLOGIST EVAL & MGMT  01/15/2021   IR RADIOLOGIST EVAL & MGMT  06/19/2021   IR REMOVAL TUN CV CATH W/O FL  01/03/2020   IR REMOVAL TUN CV CATH W/O FL  04/29/2020   IR REPLC DUODEN/JEJUNO TUBE PERCUT W/FLUORO  04/01/2020   IR US GUIDE VASC ACCESS RIGHT  04/24/2020   SPHINCTEROTOMY  12/14/2019   Procedure: SPHINCTEROTOMY;  Surgeon: Jeani Hawking, MD;  Location: [facility] ENDOSCOPY;  Service: Endoscopy;;   TOTAL KNEE ARTHROPLASTY Right 01/17/2019   Procedure: TOTAL KNEE ARTHROPLASTY;  Surgeon: Salvatore Marvel, MD;  Location: [facility] ORS;  Service: Orthopedics;  Laterality: Right;   TRANSTHORACIC ECHOCARDIOGRAM  04/2019   EF 55-60%, modLVH, impaired relaxation    TRIGGER FINGER RELEASE  2007;2010;2011   bilateral   TRIGGER FINGER RELEASE  02/2017   VAGINAL HYSTERECTOMY  1984   for menorrhagia, ovaries in place    Home Medications:  Allergies as of 05/01/2023       Reactions   Azithromycin Itching   Okay if takes benadryl along with it   Nickel    Reaction to cheap earrings cause redness   Sulfa Antibiotics Itching   Vinegar [acetic Acid] Nausea Only   Adhesive [tape] Rash   Paper tape only causes blisters   Keflex  [cephalexin] Rash        Medication List        Accurate as of May 01, 2023  1:34 PM. If you have any questions, ask your nurse or doctor.          Accu-Chek Aviva Plus test strip Generic drug: glucose blood 1 each by Other route 4 (four) times daily. And lancets 4/day   acetaminophen 500 MG tablet Commonly known as: TYLENOL Take 500 mg by mouth 2 (two) times daily.   amLODipine 10 MG tablet Commonly known as: NORVASC Take 1 tablet (10 mg total) by mouth daily.   amoxicillin-clavulanate 875-125 MG tablet Commonly known as: AUGMENTIN Take 1 tablet by mouth every 12 (twelve) hours for 10 days.   B-D UF III MINI PEN NEEDLES 31G X 5 MM Misc Generic drug: Insulin  Pen Needle Inject 1 Device into the skin 4 (four) times daily.   Baqsimi One Pack 3 MG/DOSE Powd Generic drug: Glucagon Place 3 mg into the nose daily as needed (hypoglycemia).   benazepril-hydrochlorthiazide 20-12.5 MG tablet Commonly known as: LOTENSIN HCT Take 0.5 tablets by mouth daily.   CRANBERRY PO Take 4,200 mg by mouth 2 (two) times daily.   CRANBERRY-VITAMIN C-D MANNOSE PO Take 1,500 mg by mouth daily.   Creon 36000 UNITS Cpep capsule Generic drug: lipase/protease/amylase TAKE 2 CAPSULES BY MOUTH 3 TIMES DAILY WITH MEALS   D-MANNOSE PO Take 2 tablets by mouth daily with lunch.   Dexcom G6 Sensor Misc 1 Device by Other route See admin instructions. Change every 10 days   diphenhydrAMINE 25 MG tablet Commonly known as: BENADRYL Take 25 mg by mouth 2 (two) times daily as needed for allergies.   estradiol 0.1 MG/GM vaginal cream Commonly known as: ESTRACE (DISCARD APPLICATOR) APPLY PEA SIZED AMOUNT TO TIP OF FINGER TO URETHRA BEFORE BED. WASH HANDS WELL AFTER APPLICATION. USE MONDAY, WEDNESDAY AND FRIDAY   ferrous sulfate 325 (65 FE) MG tablet Take 1 tablet (325 mg total) by mouth every other day.   fluticasone 50 MCG/ACT nasal spray Commonly known as: FLONASE Place 1 spray into both  nostrils daily.   folic acid 1 MG tablet Commonly known as: FOLVITE Take 1 tablet (1 mg total) by mouth daily.   HumaLOG KwikPen 200 UNIT/ML KwikPen Generic drug: insulin lispro Max daily 100 units   Lantus SoloStar 100 UNIT/ML Solostar Pen Generic drug: insulin glargine Inject 28 Units into the skin at bedtime.   insulin glargine 100 UNIT/ML Solostar Pen Commonly known as: LANTUS Inject 26 Units into the skin at bedtime.   loperamide 2 MG tablet Commonly known as: IMODIUM A-D Take 4 mg by mouth 4 (four) times daily as needed for diarrhea or loose stools.   meclizine 25 MG tablet Commonly known as: ANTIVERT Take 1 tablet (25 mg total) by mouth 3 (three) times daily as needed for nausea.   metoprolol succinate 50 MG 24 hr tablet Commonly known as: TOPROL-XL TAKE 1 TABLET BY MOUTH DAILY  WITH OR IMMEDIATELY FOLLOWING A  MEAL   multivitamin with minerals Tabs tablet Take 1 tablet by mouth daily.   pantoprazole 40 MG tablet Commonly known as: PROTONIX Take 1 tablet (40 mg total) by mouth every evening.   rosuvastatin 10 MG tablet Commonly known as: CRESTOR Take 1 tablet (10 mg total) by mouth daily.   triamcinolone 0.1 % paste Commonly known as: KENALOG SMARTSIG:Sparingly Topical 2-3 Times Daily   Vitamin D3 25 MCG (1000 UT) Caps Take 2 capsules (2,000 Units total) by mouth daily.        Allergies:  Allergies  Allergen Reactions   Azithromycin Itching    Okay if takes benadryl along with it   Nickel     Reaction to cheap earrings cause redness   Sulfa Antibiotics Itching   Vinegar [Acetic Acid] Nausea Only   Adhesive [Tape] Rash    Paper tape only causes blisters   Keflex [Cephalexin] Rash    Family History: Family History  Problem Relation Age of Onset   Stroke Mother        several   Hyperlipidemia Mother    Hypertension Mother    Cancer Father        colon   Hypertension Father    Hyperlipidemia Father    Coronary artery disease Father 96  MIx1, CABG   Cancer Paternal Aunt        abdominal   Coronary artery disease Maternal Grandmother    Diabetes Maternal Grandfather    Coronary artery disease Maternal Grandfather    Breast cancer Neg Hx     Social History:   reports that she has never smoked. She has never been exposed to tobacco smoke. She has never used smokeless tobacco. She reports that she does not currently use alcohol. She reports that she does not use drugs.  Physical Exam: BP 134/80   Pulse 70   Ht 5\' 1"  (1.549 m)   Wt 171 lb (77.6 kg)   BMI 32.31 kg/m   Constitutional:  Alert and oriented, no acute distress, nontoxic appearing HEENT: [place], AT Cardiovascular: No clubbing, cyanosis, or edema Respiratory: Normal respiratory effort, no increased work of breathing Skin: No rashes, bruises or suspicious lesions Neurologic: Grossly intact, no focal deficits, moving all 4 extremities Psychiatric: Normal mood and affect  Laboratory Data: Results for orders placed or performed in visit on 05/01/23  Microscopic Examination   Urine  Result Value Ref Range   WBC, UA >30 (A) 0 - 5 /hpf   RBC, Urine 0-2 0 - 2 /hpf   Epithelial Cells (non renal) >10 (A) 0 - 10 /hpf   Bacteria, UA Many (A) None seen/Few  Urinalysis, Complete  Result Value Ref Range   Specific Gravity, UA <1.005 (L) 1.005 - 1.030   pH, UA 5.0 5.0 - 7.5   Color, UA Orange Yellow   Appearance Ur Cloudy (A) Clear   Leukocytes,UA 1+ (A) Negative   Protein,UA 2+ (A) Negative/Trace   Glucose, UA Trace (A) Negative   Ketones, UA Negative Negative   RBC, UA Trace (A) Negative   Bilirubin, UA Negative Negative   Urobilinogen, Ur 1.0 0.2 - 1.0 mg/dL   Nitrite, UA Positive (A) Negative   Microscopic Examination See below:    Assessment & Plan:   1. Recurrent UTI Recurrent versus persistent Klebsiella UTI.  No hematuria, low suspicion for acute stone episode.  Will treat with IM Rocephin followed by 9 days of oral cefdinir, anticipate no  cross-reactivity with her known Keflex allergy.  We discussed that if her infection recurs, we will pursue cross-sectional imaging to rule out underlying stone nidus.  She is in agreement with this plan. - Urinalysis, Complete - CULTURE, URINE COMPREHENSIVE - cefTRIAXone (ROCEPHIN) injection 1 g - cefdinir (OMNICEF) 300 MG capsule; Take 1 capsule (300 mg total) by mouth 2 (two) times daily for 9 days.  Dispense: 18 capsule; Refill: 0   Return if symptoms worsen or fail to improve.  Carman Ching, PA-C  [facility] [place] [address] [place], [place] [address] [phone]

## 2023-05-07 LAB — CULTURE, URINE COMPREHENSIVE

## 2023-05-18 ENCOUNTER — Ambulatory Visit: Payer: Medicare Other | Admitting: Family Medicine

## 2023-05-18 ENCOUNTER — Encounter: Payer: Self-pay | Admitting: Family Medicine

## 2023-05-18 VITALS — BP 146/76 | HR 87 | Temp 97.8°F | Ht 61.0 in | Wt 171.0 lb

## 2023-05-18 DIAGNOSIS — Z87440 Personal history of urinary (tract) infections: Secondary | ICD-10-CM | POA: Diagnosis not present

## 2023-05-18 DIAGNOSIS — Z794 Long term (current) use of insulin: Secondary | ICD-10-CM

## 2023-05-18 DIAGNOSIS — K8689 Other specified diseases of pancreas: Secondary | ICD-10-CM

## 2023-05-18 DIAGNOSIS — I10 Essential (primary) hypertension: Secondary | ICD-10-CM | POA: Diagnosis not present

## 2023-05-18 DIAGNOSIS — E118 Type 2 diabetes mellitus with unspecified complications: Secondary | ICD-10-CM

## 2023-05-18 DIAGNOSIS — N183 Chronic kidney disease, stage 3 unspecified: Secondary | ICD-10-CM | POA: Diagnosis not present

## 2023-05-18 DIAGNOSIS — K529 Noninfective gastroenteritis and colitis, unspecified: Secondary | ICD-10-CM | POA: Diagnosis not present

## 2023-05-18 DIAGNOSIS — E1122 Type 2 diabetes mellitus with diabetic chronic kidney disease: Secondary | ICD-10-CM | POA: Diagnosis not present

## 2023-05-18 MED ORDER — BENAZEPRIL-HYDROCHLOROTHIAZIDE 20-12.5 MG PO TABS: 1.0000 | ORAL_TABLET | Freq: Every day | ORAL | 1 refills | Status: DC

## 2023-05-18 NOTE — Assessment & Plan Note (Signed)
Chronic, elevated BP readings today, as well as endorsed pedal edema.  Will drop amlodipine to 5mg  daily (1/2 tab of 10mg ) and increase benazepril/hydrochlorothiazide to full tablet of 20/12.5mg  daily.  RTC 7-10d after med change to update renal panel She will let me know if BP staying uncontrolled at home.

## 2023-05-18 NOTE — Assessment & Plan Note (Signed)
Flare of diarrhea while on cephalosporin, seems to be normalizing.  She is taking daily probiotic - continue this. She also continues daily creon for known pancreatic insufficiency

## 2023-05-18 NOTE — Assessment & Plan Note (Signed)
Update renal function after starting higher benazepril/hydrochlorothiazide dose.

## 2023-05-18 NOTE — Progress Notes (Signed)
Ph: [phone] Fax: [phone]   Patient ID: Katherine Oliver, female    DOB: 1946/05/30, 77 y.o.   MRN: [id number]  This visit was conducted in person.  BP (!) 146/76   Pulse 87   Temp 97.8 F (36.6 C) (Temporal)   Ht 5\' 1"  (1.549 m)   Wt 171 lb (77.6 kg)   SpO2 95%   BMI 32.31 kg/m   BP 160/84 on repeat testing  CC: 6 mo f/u visit  Subjective:   HPI: Katherine Oliver is a 77 y.o. female presenting on 05/18/2023 for Medical Management of Chronic Issues (Here for 6 mo f/u. Pt accompanied by husband, Casimiro Needle. )   Saw urology late last month for recurrent vs persistent UTI (Klebsiella) treated with rocephin 1gm IM x1 followed by 9d oral cefdinir. Completed course about a week ago. Prior to this treated with cipro.   Loose stools/diarrhea started while on cefdinir, has had this for the past 2 weeks. All oral intake caused diarrhea. Also with diarrhea at night time. She started daily Culturelle probiotic. Symptoms are improving since completing antibiotics.   HTN - continues amlodipine 10mg  daily, benazepril hydrochlorothiazide 20/12.5mg  1/2 tab daily, Toprol XL 50mg  daily. Does check at home: 130/70s. No HA, vision changes, CP/tightness, SOB. Notes leg swelling at ankles, notes some gingival enlargement.   DM - followed by endocrinology [facility]) on lantus 26u daily, humalog 18/18/20 with meals, 16u with bedtime snack. She monitors sugars with Dexcom G6 CGM. Latest DM eye exam 03/2023 ([facility]).      Relevant past medical, surgical, family and social history reviewed and updated as indicated. Interim medical history since our last visit reviewed. Allergies and medications reviewed and updated. Outpatient Medications Prior to Visit  Medication Sig Dispense Refill   acetaminophen (TYLENOL) 500 MG tablet Take 500 mg by mouth 2 (two) times daily.     Cholecalciferol (VITAMIN D3) 25 MCG (1000 UT) CAPS Take 2 capsules (2,000 Units total) by mouth daily.      Continuous Blood Gluc Sensor (DEXCOM G6 SENSOR) MISC 1 Device by Other route See admin instructions. Change every 10 days 9 each 3   CRANBERRY PO Take 4,200 mg by mouth 2 (two) times daily.     CRANBERRY-VITAMIN C-D MANNOSE PO Take 1,500 mg by mouth daily.     CREON 36000-114000 units CPEP capsule TAKE 2 CAPSULES BY MOUTH 3 TIMES DAILY WITH MEALS 600 capsule 3   D-MANNOSE PO Take 2 tablets by mouth daily with lunch.     diphenhydrAMINE (BENADRYL) 25 MG tablet Take 25 mg by mouth 2 (two) times daily as needed for allergies.     estradiol (ESTRACE) 0.1 MG/GM vaginal cream (DISCARD APPLICATOR) APPLY PEA SIZED AMOUNT TO TIP OF FINGER TO URETHRA BEFORE BED. WASH HANDS WELL AFTER APPLICATION. USE MONDAY, WEDNESDAY AND FRIDAY 42.5 g 11   ferrous sulfate 325 (65 FE) MG tablet Take 1 tablet (325 mg total) by mouth every other day.     fluticasone (FLONASE) 50 MCG/ACT nasal spray Place 1 spray into both nostrils daily. 16 g 11   folic acid (FOLVITE) 1 MG tablet Take 1 tablet (1 mg total) by mouth daily. 90 tablet 4   Glucagon (BAQSIMI ONE PACK) 3 MG/DOSE POWD Place 3 mg into the nose daily as needed (hypoglycemia).     glucose blood (ACCU-CHEK AVIVA PLUS) test strip 1 each by Other route 4 (four) times daily. And lancets 4/day 400 each 3   insulin glargine (LANTUS) 100  UNIT/ML Solostar Pen Inject 26 Units into the skin at bedtime. 30 mL 3   insulin lispro (HUMALOG KWIKPEN) 200 UNIT/ML KwikPen Max daily 100 units 30 mL 3   Insulin Pen Needle (B-D UF III MINI PEN NEEDLES) 31G X 5 MM MISC Inject 1 Device into the skin 4 (four) times daily. 400 each 3   Lactobacillus (CULTURELLE WOMENS 4 IN 1 PO) Take by mouth daily.     loperamide (IMODIUM A-D) 2 MG tablet Take 4 mg by mouth 4 (four) times daily as needed for diarrhea or loose stools.     meclizine (ANTIVERT) 25 MG tablet Take 1 tablet (25 mg total) by mouth 3 (three) times daily as needed for nausea. 30 tablet 0   metoprolol succinate (TOPROL-XL) 50 MG 24 hr  tablet TAKE 1 TABLET BY MOUTH DAILY  WITH OR IMMEDIATELY FOLLOWING A  MEAL 90 tablet 4   Multiple Vitamin (MULTIVITAMIN WITH MINERALS) TABS tablet Take 1 tablet by mouth daily.     pantoprazole (PROTONIX) 40 MG tablet Take 1 tablet (40 mg total) by mouth every evening. 90 tablet 4   rosuvastatin (CRESTOR) 10 MG tablet Take 1 tablet (10 mg total) by mouth daily. 90 tablet 4   triamcinolone (KENALOG) 0.1 % paste SMARTSIG:Sparingly Topical 2-3 Times Daily 5 g 1   amLODipine (NORVASC) 10 MG tablet Take 1 tablet (10 mg total) by mouth daily. 90 tablet 4   benazepril-hydrochlorthiazide (LOTENSIN HCT) 20-12.5 MG tablet Take 0.5 tablets by mouth daily. 45 tablet 4   insulin glargine (LANTUS SOLOSTAR) 100 UNIT/ML Solostar Pen Inject 28 Units into the skin at bedtime. 30 mL 3   amLODipine (NORVASC) 10 MG tablet Take 0.5 tablets (5 mg total) by mouth daily.     No facility-administered medications prior to visit.     Per HPI unless specifically indicated in ROS section below Review of Systems  Objective:  BP (!) 146/76   Pulse 87   Temp 97.8 F (36.6 C) (Temporal)   Ht 5\' 1"  (1.549 m)   Wt 171 lb (77.6 kg)   SpO2 95%   BMI 32.31 kg/m   Wt Readings from Last 3 Encounters:  05/18/23 171 lb (77.6 kg)  05/01/23 171 lb (77.6 kg)  04/16/23 171 lb 6 oz (77.7 kg)      Physical Exam Vitals and nursing note reviewed.  Constitutional:      Appearance: Normal appearance. She is not ill-appearing.  HENT:     Head: Normocephalic and atraumatic.     Mouth/Throat:     Mouth: Mucous membranes are moist.     Pharynx: Oropharynx is clear. No oropharyngeal exudate or posterior oropharyngeal erythema.  Eyes:     Extraocular Movements: Extraocular movements intact.     Conjunctiva/sclera: Conjunctivae normal.     Pupils: Pupils are equal, round, and reactive to light.  Cardiovascular:     Rate and Rhythm: Normal rate and regular rhythm.     Pulses: Normal pulses.     Heart sounds: Normal heart sounds.  No murmur heard. Pulmonary:     Effort: Pulmonary effort is normal. No respiratory distress.     Breath sounds: Normal breath sounds. No wheezing, rhonchi or rales.  Musculoskeletal:     Right lower leg: Edema (tr) present.     Left lower leg: Edema (tr) present.  Skin:    General: Skin is warm and dry.     Findings: No rash.  Neurological:     Mental Status: She is  alert.  Psychiatric:        Mood and Affect: Mood normal.        Behavior: Behavior normal.       Lab Results  Component Value Date   NA 140 11/17/2022   CL 105 11/17/2022   K 4.9 11/17/2022   CO2 25 11/17/2022   BUN 33 (H) 11/17/2022   CREATININE 1.37 (H) 11/17/2022   GFR 37.05 (L) 11/17/2022   CALCIUM 9.6 11/17/2022   PHOS 3.6 11/17/2022   ALBUMIN 4.7 11/17/2022   GLUCOSE 110 (H) 11/17/2022   Lab Results  Component Value Date   HGBA1C 7.3 (A) 04/15/2023    Assessment & Plan:   Problem List Items Addressed This Visit     Controlled diabetes mellitus type 2 with complications (HCC)    Chronic, stable period. Appreciate endocrinology care      Relevant Medications   benazepril-hydrochlorthiazide (LOTENSIN HCT) 20-12.5 MG tablet   Accelerated hypertension - Primary    Chronic, elevated BP readings today, as well as endorsed pedal edema.  Will drop amlodipine to 5mg  daily (1/2 tab of 10mg ) and increase benazepril/hydrochlorothiazide to full tablet of 20/12.5mg  daily.  RTC 7-10d after med change to update renal panel She will let me know if BP staying uncontrolled at home.      Relevant Medications   amLODipine (NORVASC) 10 MG tablet   benazepril-hydrochlorthiazide (LOTENSIN HCT) 20-12.5 MG tablet   Other Relevant Orders   Renal function panel   History of recurrent UTIs    Appreciate urology care. Recently completed cipro followed by cephalosporin course.       CKD stage 3 due to type 2 diabetes mellitus (HCC)    Update renal function after starting higher benazepril/hydrochlorothiazide dose.        Relevant Medications   benazepril-hydrochlorthiazide (LOTENSIN HCT) 20-12.5 MG tablet   Other Relevant Orders   Renal function panel   Chronic diarrhea    Flare of diarrhea while on cephalosporin, seems to be normalizing.  She is taking daily probiotic - continue this. She also continues daily creon for known pancreatic insufficiency      Pancreatic insufficiency     Meds ordered this encounter  Medications   benazepril-hydrochlorthiazide (LOTENSIN HCT) 20-12.5 MG tablet    Sig: Take 1 tablet by mouth daily.    Dispense:  90 tablet    Refill:  1    Note new dose    Orders Placed This Encounter  Procedures   Renal function panel    Standing Status:   Future    Standing Expiration Date:   05/17/2024    Patient Instructions  Continue monitoring blood pressures.  Drop amlodipine to 5mg  daily (1/2 tablet). Increase benazepril/hydrochlorothiazide to 1 whole tablet daily.  Continue Toprol XL 50mg  daily. Schedule lab visit only in 7-10 days after change in BP medicines Keep an eye on blood pressures, let me know if running too high or low.  Return in 6 months for wellness visit   Follow up plan: Return in about 6 months (around 11/18/2023) for medicare wellness visit.  Eustaquio Boyden, MD

## 2023-05-18 NOTE — Patient Instructions (Addendum)
Continue monitoring blood pressures.  Drop amlodipine to 5mg  daily (1/2 tablet). Increase benazepril/hydrochlorothiazide to 1 whole tablet daily.  Continue Toprol XL 50mg  daily. Schedule lab visit only in 7-10 days after change in BP medicines Keep an eye on blood pressures, let me know if running too high or low.  Return in 6 months for wellness visit

## 2023-05-18 NOTE — Assessment & Plan Note (Signed)
Chronic, stable period. Appreciate endocrinology care

## 2023-05-18 NOTE — Assessment & Plan Note (Signed)
Appreciate urology care. Recently completed cipro followed by cephalosporin course.

## 2023-05-28 ENCOUNTER — Other Ambulatory Visit (INDEPENDENT_AMBULATORY_CARE_PROVIDER_SITE_OTHER): Payer: Medicare Other

## 2023-05-28 DIAGNOSIS — I10 Essential (primary) hypertension: Secondary | ICD-10-CM | POA: Diagnosis not present

## 2023-05-28 DIAGNOSIS — N183 Chronic kidney disease, stage 3 unspecified: Secondary | ICD-10-CM | POA: Diagnosis not present

## 2023-05-28 DIAGNOSIS — E1122 Type 2 diabetes mellitus with diabetic chronic kidney disease: Secondary | ICD-10-CM

## 2023-05-28 LAB — RENAL FUNCTION PANEL
Albumin: 4.4 g/dL (ref 3.5–5.2)
BUN: 31 mg/dL — ABNORMAL HIGH (ref 6–23)
CO2: 25 mEq/L (ref 19–32)
Calcium: 9.7 mg/dL (ref 8.4–10.5)
Chloride: 104 mEq/L (ref 96–112)
Creatinine, Ser: 1.32 mg/dL — ABNORMAL HIGH (ref 0.40–1.20)
GFR: 38.59 mL/min — ABNORMAL LOW (ref >60.00)
Glucose, Bld: 211 mg/dL — ABNORMAL HIGH (ref 70–99)
Phosphorus: 3.1 mg/dL (ref 2.3–4.6)
Potassium: 4.2 mEq/L (ref 3.5–5.1)
Sodium: 139 mEq/L (ref 135–145)

## 2023-06-30 ENCOUNTER — Other Ambulatory Visit: Payer: Self-pay | Admitting: Internal Medicine

## 2023-07-01 ENCOUNTER — Other Ambulatory Visit: Payer: Self-pay

## 2023-07-01 MED ORDER — HUMALOG KWIKPEN 200 UNIT/ML ~~LOC~~ SOPN: PEN_INJECTOR | SUBCUTANEOUS | 1 refills | Status: DC

## 2023-08-07 ENCOUNTER — Other Ambulatory Visit: Payer: Self-pay | Admitting: Family Medicine

## 2023-08-07 DIAGNOSIS — Z1231 Encounter for screening mammogram for malignant neoplasm of breast: Secondary | ICD-10-CM

## 2023-08-11 ENCOUNTER — Ambulatory Visit (INDEPENDENT_AMBULATORY_CARE_PROVIDER_SITE_OTHER): Payer: Medicare Other

## 2023-08-11 DIAGNOSIS — Z23 Encounter for immunization: Secondary | ICD-10-CM

## 2023-09-21 ENCOUNTER — Ambulatory Visit
Admission: RE | Admit: 2023-09-21 | Discharge: 2023-09-21 | Disposition: A | Discharge status: Home / Self Care | Payer: Medicare Other | Source: Ambulatory Visit | Attending: Family Medicine | Admitting: Family Medicine

## 2023-09-21 DIAGNOSIS — Z1231 Encounter for screening mammogram for malignant neoplasm of breast: Secondary | ICD-10-CM | POA: Diagnosis not present

## 2023-09-24 ENCOUNTER — Other Ambulatory Visit: Payer: Self-pay | Admitting: Family Medicine

## 2023-09-29 ENCOUNTER — Other Ambulatory Visit: Payer: Self-pay | Admitting: Family Medicine

## 2023-09-29 DIAGNOSIS — I10 Essential (primary) hypertension: Secondary | ICD-10-CM

## 2023-10-07 ENCOUNTER — Other Ambulatory Visit: Payer: Self-pay

## 2023-10-07 NOTE — Telephone Encounter (Signed)
Meclizine Last rx:  11/17/22, #30 Last OV:  05/18/23, 6 mo f/u Next OV:  11/20/23, CPE

## 2023-10-08 MED ORDER — MECLIZINE HCL 25 MG PO TABS: 25.0000 mg | ORAL_TABLET | Freq: Three times a day (TID) | ORAL | 0 refills | Status: AC | PRN

## 2023-10-12 ENCOUNTER — Ambulatory Visit (INDEPENDENT_AMBULATORY_CARE_PROVIDER_SITE_OTHER): Payer: Medicare Other

## 2023-10-12 VITALS — BP 128/78 | Ht 61.0 in | Wt 176.6 lb

## 2023-10-12 DIAGNOSIS — Z0000 Encounter for general adult medical examination without abnormal findings: Secondary | ICD-10-CM | POA: Diagnosis not present

## 2023-10-12 NOTE — Patient Instructions (Signed)
Katherine Oliver , Thank you for taking time to come for your Medicare Wellness Visit. I appreciate your ongoing commitment to your health goals. Please review the following plan we discussed and let me know if I can assist you in the future.   Referrals/Orders/Follow-Ups/Clinician Recommendations: none  This is a list of the screening recommended for you and due dates:  Health Maintenance  Topic Date Due   Zoster (Shingles) Vaccine (1 of 2) 08/11/1963   COVID-19 Vaccine (3 - Pfizer risk series) 01/05/2020   Colon Cancer Screening  10/15/2023   Hemoglobin A1C  10/15/2023   Yearly kidney health urinalysis for diabetes  11/18/2023   Eye exam for diabetics  03/18/2024   Complete foot exam   04/14/2024   Yearly kidney function blood test for diabetes  05/27/2024   Mammogram  09/20/2024   Medicare Annual Wellness Visit  10/11/2024   DTaP/Tdap/Td vaccine (3 - Tdap) 08/11/2027   Pneumonia Vaccine  Completed   Flu Shot  Completed   DEXA scan (bone density measurement)  Completed   Hepatitis C Screening  Completed   HPV Vaccine  Aged Out    Advanced directives: (In Chart) A copy of your advanced directives are scanned into your chart should your provider ever need it.  Next Medicare Annual Wellness Visit scheduled for next year: Yes 10/12/2024 @ 11:30am in person

## 2023-10-12 NOTE — Progress Notes (Signed)
Subjective:   Katherine Oliver is a 77 y.o. female who presents for Medicare Annual (Subsequent) preventive examination.  Visit Complete: IN person  Patient Medicare AWV questionnaire was completed by the patient on 10/09/23; I have confirmed that all information answered by patient is correct and no changes since this date.  Cardiac Risk Factors include: advanced age (>5men, >2 women);diabetes mellitus;dyslipidemia;hypertension;female gender;obesity (BMI >30kg/m2);sedentary lifestyle    Objective:    Today's Vitals   10/09/23 1913 10/12/23 1050  BP:  128/78  Weight:  176 lb 9.6 oz (80.1 kg)  Height:  5\' 1"  (1.549 m)  PainSc: 4     Body mass index is 33.37 kg/m.     10/12/2023   11:02 AM 04/28/2023   10:51 AM 11/08/2022   12:27 PM 10/14/2022    9:44 AM 10/08/2022    3:40 PM 10/03/2021    2:07 PM 07/31/2021    7:25 AM  Advanced Directives  Does Patient Have a Medical Advance Directive? Yes Yes No Yes Yes Yes Yes  Type of Estate agent of [place];Living will   Healthcare Power of [place];Living will Healthcare Power of [place];Living will Healthcare Power of [place];Living will Healthcare Power of [place];Living will  Does patient want to make changes to medical advance directive?     No - Patient declined Yes (MAU/Ambulatory/Procedural Areas - Information given) No - Patient declined  Copy of Healthcare Power of Attorney in Chart? Yes - validated most recent copy scanned in chart (See row information)   No - copy requested Yes - validated most recent copy scanned in chart (See row information)    Would patient like information on creating a medical advance directive?   No - Patient declined        Current Medications (verified) Outpatient Encounter Medications as of 10/12/2023  Medication Sig   acetaminophen (TYLENOL) 500 MG tablet Take 500 mg by mouth 2 (two) times daily.   amLODipine (NORVASC) 10 MG tablet Take 0.5 tablets (5 mg total) by mouth  daily.   B-D UF III MINI PEN NEEDLES 31G X 5 MM MISC INJECT 1 DEVICE INTO THE SKIN 4 (FOUR) TIMES DAILY.   benazepril-hydrochlorthiazide (LOTENSIN HCT) 20-12.5 MG tablet TAKE 1 TABLET BY MOUTH DAILY   Cholecalciferol (VITAMIN D3) 25 MCG (1000 UT) CAPS Take 2 capsules (2,000 Units total) by mouth daily.   Continuous Blood Gluc Sensor (DEXCOM G6 SENSOR) MISC 1 Device by Other route See admin instructions. Change every 10 days   CRANBERRY PO Take 4,200 mg by mouth 2 (two) times daily.   CRANBERRY-VITAMIN C-D MANNOSE PO Take 1,500 mg by mouth daily.   CREON 36000-114000 units CPEP capsule TAKE 2 CAPSULES BY MOUTH 3 TIMES DAILY WITH MEALS   D-MANNOSE PO Take 2 tablets by mouth daily with lunch.   diphenhydrAMINE (BENADRYL) 25 MG tablet Take 25 mg by mouth 2 (two) times daily as needed for allergies.   estradiol (ESTRACE) 0.1 MG/GM vaginal cream (DISCARD APPLICATOR) APPLY PEA SIZED AMOUNT TO TIP OF FINGER TO URETHRA BEFORE BED. WASH HANDS WELL AFTER APPLICATION. USE MONDAY, WEDNESDAY AND FRIDAY   ferrous sulfate 325 (65 FE) MG tablet Take 1 tablet (325 mg total) by mouth every other day.   fluticasone (FLONASE) 50 MCG/ACT nasal spray Place 1 spray into both nostrils daily.   folic acid (FOLVITE) 1 MG tablet Take 1 tablet (1 mg total) by mouth daily.   Glucagon (BAQSIMI ONE PACK) 3 MG/DOSE POWD Place 3 mg into the nose  daily as needed (hypoglycemia).   glucose blood (ACCU-CHEK AVIVA PLUS) test strip 1 each by Other route 4 (four) times daily. And lancets 4/day   insulin glargine (LANTUS) 100 UNIT/ML Solostar Pen Inject 26 Units into the skin at bedtime.   insulin lispro (HUMALOG KWIKPEN) 200 UNIT/ML KwikPen Max daily 100 units   Lactobacillus (CULTURELLE WOMENS 4 IN 1 PO) Take by mouth daily.   loperamide (IMODIUM A-D) 2 MG tablet Take 4 mg by mouth 4 (four) times daily as needed for diarrhea or loose stools.   meclizine (ANTIVERT) 25 MG tablet Take 1 tablet (25 mg total) by mouth 3 (three) times daily  as needed for nausea.   metoprolol succinate (TOPROL-XL) 50 MG 24 hr tablet TAKE 1 TABLET BY MOUTH DAILY  WITH OR IMMEDIATELY FOLLOWING A  MEAL   Multiple Vitamin (MULTIVITAMIN WITH MINERALS) TABS tablet Take 1 tablet by mouth daily.   pantoprazole (PROTONIX) 40 MG tablet Take 1 tablet (40 mg total) by mouth every evening.   rosuvastatin (CRESTOR) 10 MG tablet Take 1 tablet (10 mg total) by mouth daily.   triamcinolone (KENALOG) 0.1 % paste APPLY SPARINGLY 2-3 TIMES DAILY AS DIRECTED   No facility-administered encounter medications on file as of 10/12/2023.    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 nephropathy (HCC)    DSME [facility] 01/2016    COVID-19 virus infection 11/10/2022   DKA (diabetic ketoacidoses) 04/22/2020   Frequent epistaxis 05/16/2019   S/p cauterization with resolution 2020   GERD (gastroesophageal reflux disease)    Hepatic steatosis    by abd [place] 05/2012, mild transaminitis - normal iron sat and viral hep panel (2011), stable [place] 2017   History of chicken pox    History of measles    History of recurrent UTIs    on chronic keflex   HLD (hyperlipidemia)    HTN (hypertension)    Hypertensive retinopathy of both eyes, 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   Bulakowski   Kidney cyst, acquired 01/2016   L kidney by US   Kidney stone 01/2016   L kidney by [place]   Lung nodules 11/2013   overall stable on f/u CT 01/2016   Osteopenia 06/2013   mild, forearm T -1.1, hip and spine WNL   Pancreatitis    Polycythemia    mild, stable (2013)   Primary localized osteoarthritis of right knee 01/05/2019   Rosacea    metrogel   Past Surgical History:  Procedure  Laterality Date   APPENDECTOMY  1987   BILIARY STENT PLACEMENT  12/14/2019   Procedure: BILIARY STENT PLACEMENT;  Surgeon: Jeani Hawking, MD;  Location: [facility] ENDOSCOPY;  Service: Endoscopy;;   BREAST BIOPSY Right 1963   benign   BREAST BIOPSY Right 08/2014   benign- core   cardiolite stress test  04/2004   normal   CESAREAN SECTION  1610;[id number]   x2   CHOLECYSTECTOMY  2003   COLONOSCOPY  09/26/2008   adenomatous polyp, rpt 3-5 yrs   COLONOSCOPY  08/2012   adenomatous polyps, diverticulosis, rec rpt 5 yrs Marva Panda)   COLONOSCOPY WITH PROPOFOL N/A 02/05/2018   4TA, SSA, diverticulosis, rpt 3 yrs Marva Panda,  Cindra Eves, MD)   COLONOSCOPY WITH PROPOFOL N/A 10/14/2022   fair prep, TAx2 one was 1cm in size, diverticulosis, rpt 1 yr given fair prep [place]ia [place], Rossie Muskrat, MD)   dexa  2003   normal   dexa  06/2013   [facility] - Tscore -1.1 forearm, normal spine and femur   ERCP N/A 12/14/2019   Procedure: ENDOSCOPIC RETROGRADE CHOLANGIOPANCREATOGRAPHY (ERCP);  Surgeon: Jeani Hawking, MD;  Location: [facility] ENDOSCOPY;  Service: Endoscopy;  Laterality: N/A;   ERCP  01/2020   nonbleeding gastric ulcer - [facility] hospitalization (Dr Shana Chute)   ESOPHAGOGASTRODUODENOSCOPY N/A 12/19/2019   Procedure: ESOPHAGOGASTRODUODENOSCOPY (EGD);  Surgeon: Charna Elizabeth, MD;  Location: [facility] ENDOSCOPY;  Service: Endoscopy;  Laterality: N/A;   ESOPHAGOGASTRODUODENOSCOPY  01/2020   pre existing AXIOS cystogastrostomy stent s/p necrosectomy - [facility] hospitalization (Dr Shana Chute)   ESOPHAGOGASTRODUODENOSCOPY N/A 04/24/2020   Procedure: ESOPHAGOGASTRODUODENOSCOPY (EGD);  Surgeon: Graylin Shiver, MD;  Location: [facility] ENDOSCOPY;  Service: Endoscopy;  Laterality: N/A;   ESOPHAGOGASTRODUODENOSCOPY  05/2020   normal esophagus, duodenum, erythematous mucosa in gastric body, gastric cystogastrostomy stent removed ([facility])   IR FLUORO GUIDE CV LINE RIGHT  12/27/2019   IR FLUORO GUIDE CV LINE RIGHT  04/24/2020   IR IVC FILTER PLMT / S&I /IMG  GUID/MOD SED  04/26/2020   IR IVC FILTER RETRIEVAL / S&I /IMG GUID/MOD SED  07/31/2021   IR RADIOLOGIST EVAL & MGMT  07/31/2020   IR RADIOLOGIST EVAL & MGMT  01/15/2021   IR RADIOLOGIST EVAL & MGMT  06/19/2021   IR REMOVAL TUN CV CATH W/O FL  01/03/2020   IR REMOVAL TUN CV CATH W/O FL  04/29/2020   IR REPLC DUODEN/JEJUNO TUBE PERCUT W/FLUORO  04/01/2020   IR US GUIDE VASC ACCESS RIGHT  04/24/2020   SPHINCTEROTOMY  12/14/2019   Procedure: SPHINCTEROTOMY;  Surgeon: Jeani Hawking, MD;  Location: [facility] ENDOSCOPY;  Service: Endoscopy;;   TOTAL KNEE ARTHROPLASTY Right 01/17/2019   Procedure: TOTAL KNEE ARTHROPLASTY;  Surgeon: Salvatore Marvel, MD;  Location: [facility] ORS;  Service: Orthopedics;  Laterality: Right;   TRANSTHORACIC ECHOCARDIOGRAM  04/2019   EF 55-60%, modLVH, impaired relaxation    TRIGGER FINGER RELEASE  2007;2010;2011   bilateral   TRIGGER FINGER RELEASE  02/2017   VAGINAL HYSTERECTOMY  1984   for menorrhagia, ovaries in place   Family History  Problem Relation Age of Onset   Stroke Mother        several   Hyperlipidemia Mother    Hypertension Mother    Cancer Father        colon   Hypertension Father    Hyperlipidemia Father    Coronary artery disease Father 61       MIx1, CABG   Cancer Paternal Aunt        abdominal   Coronary artery disease Maternal Grandmother    Diabetes Maternal Grandfather    Coronary artery disease Maternal Grandfather    Breast cancer Neg Hx    Social History   Socioeconomic History   Marital status: Married    Spouse name: Not on file   Number of children: Not on file   Years of education: Not on file   Highest education level: Not on file  Occupational History   Not on file  Tobacco Use   Smoking status: Never    Passive exposure: Never   Smokeless tobacco: Never  Vaping Use   Vaping status: Never Used  Substance and Sexual Activity   Alcohol use: Not Currently  Drug use: No   Sexual activity: Yes    Birth control/protection:  Post-menopausal  Other Topics Concern   Not on file  Social History Narrative   B+ blood type   Caffeine: 2 cups coffee/day   Lives with husband, no pets, grown children ([place] and [place])   Occupation: retired Runner, 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 Daily walking about 20 min.    Diet: ok water intake 4 glasses/day, daily fruits/vegetables, red meat 4x/wk, fish 3-4x/wk   Social Determinants of Health   Financial Resource Strain: Low Risk  (10/09/2023)   Overall Financial Resource Strain (CARDIA)    Difficulty of Paying Living Expenses: Not hard at all  Food Insecurity: No Food Insecurity (10/09/2023)   Hunger Vital Sign    Worried About Running Out of Food in the Last Year: Never true    Ran Out of Food in the Last Year: Never true  Transportation Needs: No Transportation Needs (10/09/2023)   PRAPARE - Administrator, Civil Service (Medical): No    Lack of Transportation (Non-Medical): No  Physical Activity: Insufficiently Active (10/09/2023)   Exercise Vital Sign    Days of Exercise per Week: 1 day    Minutes of Exercise per Session: 40 min  Stress: No Stress Concern Present (10/09/2023)   [employer] of Occupational Health - Occupational Stress Questionnaire    Feeling of Stress : Only a little  Social Connections: Unknown (10/09/2023)   Social Connection and Isolation Panel [NHANES]    Frequency of Communication with Friends and Family: Once a week    Frequency of Social Gatherings with Friends and Family: Once a week    Attends Religious Services: Not on Marketing executive or Organizations: No    Attends Banker Meetings: Never    Marital Status: Married    Tobacco Counseling Counseling given: Not Answered   Clinical Intake:    Pain : 0-10 Pain Score: 4  Pain Type: Chronic pain Pain Location: Back Pain Orientation: Lower Pain Descriptors / Indicators:  Aching Pain Onset: 1 to 4 weeks ago Pain Frequency: Intermittent Pain Relieving Factors: Tylenol and ASA  Pain Relieving Factors: Tylenol and ASA  BMI - recorded: 33.37 Nutritional Status: BMI > 30  Obese Nutritional Risks: None Diabetes: Yes CBG done?: Yes (BS 209 this am at home) CBG resulted in Enter/ Edit results?: No Did pt. bring in CBG monitor from home?: No  How often do you need to have someone help you when you read instructions, pamphlets, or other written materials from your doctor or pharmacy?: 1 - Never  Interpreter Needed?: No  Comments: lives husband Information entered by :: B.Marijo Quizon,LPN   Activities of Daily Living    10/09/2023    7:13 PM  In your present state of health, do you have any difficulty performing the following activities:  Hearing? 0  Vision? 0  Difficulty concentrating or making decisions? 0  Walking or climbing stairs? 0  Dressing or bathing? 0  Doing errands, shopping? 0  Preparing Food and eating ? N  Using the Toilet? N  In the past six months, have you accidently leaked urine? Y  Do you have problems with loss of bowel control? N  Managing your Medications? N  Managing your Finances? N  Housekeeping or managing your Housekeeping? N    Patient Care Team: Eustaquio Boyden, MD as PCP - General (Family  Medicine) Salvatore Marvel, MD as Consulting Physician (Orthopedic Surgery) Deirdre Evener, MD as Consulting Physician (Dermatology) Tobe Sos, MD as Consulting Physician (Dentistry) Blair Promise, OD as Consulting Physician (Optometry)  Indicate any recent Medical Services you may have received from other than [facility] providers in the past year (date may be approximate).     Assessment:   This is a routine wellness examination for [place].  Hearing/Vision screen Hearing Screening - Comments:: Pt hearing is good Vision Screening - Comments:: Pt wears glasses and vision is good Dr Lisette Abu   Goals Addressed              This Visit's Progress    COMPLETED: DIET - EAT MORE FRUITS AND VEGETABLES   On track    COMPLETED: Patient Stated   On track    09/25/2020, I will continue to do housework and work in my garden periodically.      Patient Stated       No new goals other than organize her papaerwork       Depression Screen    10/12/2023   10:57 AM 05/18/2023   10:57 AM 11/17/2022   12:01 PM 10/08/2022    3:38 PM 10/03/2021    2:10 PM 07/09/2021    1:24 PM 09/25/2020   11:38 AM  PHQ 2/9 Scores  PHQ - 2 Score 0 2 0 0 0 0 0  PHQ- 9 Score  5 3 0   0    Fall Risk    10/09/2023    7:13 PM 05/18/2023   10:57 AM 11/17/2022   12:01 PM 10/08/2022    3:40 PM 10/03/2021    2:08 PM  Fall Risk   Falls in the past year? 0 0 0 0 0  Number falls in past yr: 0   0 0  Injury with Fall? 0   0 0  Risk for fall due to : No Fall Risks   No Fall Risks No Fall Risks  Follow up Education provided;Falls prevention discussed   Falls prevention discussed;Falls evaluation completed Falls prevention discussed    MEDICARE RISK AT HOME: Medicare Risk at Home Any stairs in or around the home?: Yes If so, are there any without handrails?: No Home free of loose throw rugs in walkways, pet beds, electrical cords, etc?: Yes Adequate lighting in your home to reduce risk of falls?: Yes Life alert?: No Use of a cane, walker or w/c?: No Grab bars in the bathroom?: Yes Shower chair or bench in shower?: Yes Elevated toilet seat or a handicapped toilet?: Yes  TIMED UP AND GO:  Was the test performed?  Yes  Length of time to ambulate 10 feet: 12 sec Gait slow and steady without use of assistive device    Cognitive Function:    09/25/2020   11:39 AM 08/23/2019    2:07 PM 08/16/2018   12:04 PM 08/10/2017    9:05 AM 07/14/2016   11:26 AM  MMSE - Mini Mental State Exam  Orientation to time 5 5 5 5 5   Orientation to Place 5 5 5 5 5   Registration 3 3 3 3 3   Attention/ Calculation 5 5 0 0 0  Recall 3 3 3 3 3    Language- name 2 objects   0 0 0  Language- repeat 1 1 1 1 1   Language- follow 3 step command   3 3 3   Language- read & follow direction   0 0 0  Write a sentence  0 0 0  Copy design   0 0 0  Total score   20 20 20         10/12/2023   11:04 AM 10/08/2022    3:47 PM  6CIT Screen  What Year? 0 points 0 points  What month? 0 points 0 points  What time? 0 points 0 points  Count back from 20 0 points 0 points  Months in reverse 0 points 0 points  Repeat phrase 0 points 2 points  Total Score 0 points 2 points    Immunizations Immunization History  Administered Date(s) Administered   Fluad Quad(high Dose 65+) 07/28/2019, 07/17/2021, 09/02/2022   Fluad Trivalent(High Dose 65+) 08/11/2023   Influenza, High Dose Seasonal PF 08/10/2017, 08/16/2018, 08/15/2020   Influenza,inj,Quad PF,6+ Mos 08/02/2014, 07/05/2015, 07/14/2016   Influenza-Unspecified 08/03/2013, 07/17/2021   PFIZER(Purple Top)SARS-COV-2 Vaccination 11/17/2019, 12/08/2019   Pneumococcal Conjugate-13 06/30/2014   Pneumococcal Polysaccharide-23 11/03/2002, 09/20/2007, 05/31/2012   Td 01/20/2005, 08/10/2017   Zoster, Live 11/03/2008    TDAP status: Up to date  Flu Vaccine status: Up to date  Pneumococcal vaccine status: Up to date  Covid-19 vaccine status: Completed vaccines  Qualifies for Shingles Vaccine? Yes   Zostavax completed No   Shingrix Completed?: No.    Education has been provided regarding the importance of this vaccine. Patient has been advised to call insurance company to determine out of pocket expense if they have not yet received this vaccine. Advised may also receive vaccine at local pharmacy or Health Dept. Verbalized acceptance and understanding.  Screening Tests Health Maintenance  Topic Date Due   Zoster Vaccines- Shingrix (1 of 2) 08/11/1965   COVID-19 Vaccine (3 - Pfizer risk series) 01/05/2020   Colonoscopy  10/15/2023   HEMOGLOBIN A1C  10/15/2023   Diabetic kidney evaluation - Urine  ACR  11/18/2023   OPHTHALMOLOGY EXAM  03/18/2024   FOOT EXAM  04/14/2024   Diabetic kidney evaluation - eGFR measurement  05/27/2024   MAMMOGRAM  09/20/2024   Medicare Annual Wellness (AWV)  10/11/2024   DTaP/Tdap/Td (3 - Tdap) 08/11/2027   Pneumonia Vaccine 72+ Years old  Completed   INFLUENZA VACCINE  Completed   DEXA SCAN  Completed   Hepatitis C Screening  Completed   HPV VACCINES  Aged Out    Health Maintenance  Health Maintenance Due  Topic Date Due   Zoster Vaccines- Shingrix (1 of 2) 08[birth year]11/1965   COVID-19 Vaccine (3 - Pfizer risk series) 01/05/2020   Colonoscopy  10/15/2023    Colorectal cancer screening: No longer required.   Mammogram status: No longer required due to age.  Bone Density status: Completed 09/18/2022. Results reflect: Bone density results: OSTEOPENIA. Repeat every 3-5 years.  Lung Cancer Screening: (Low Dose CT Chest recommended if Age 51-80 years, 20 pack-year currently smoking OR have quit w/in 15years.) does not qualify.   Lung Cancer Screening Referral: no  Additional Screening:  Hepatitis C Screening: does not qualify; Completed 07/31/2016  Vision Screening: Recommended annual ophthalmology exams for early detection of glaucoma and other disorders of the eye. Is the patient up to date with their annual eye exam?  Yes  Who is the provider or what is the name of the office in which the patient attends annual eye exams? Dr Dion Body If pt is not established with a provider, would they like to be referred to a provider to establish care? No .   Dental Screening: Recommended annual dental exams for proper oral hygiene  Diabetic Foot Exam: Diabetic  Foot Exam: Completed 04/15/23  Community Resource Referral / Chronic Care Management: CRR required this visit?  No   CCM required this visit?  No    Plan:     I have personally reviewed and noted the following in the patient's chart:   Medical and social history Use of alcohol, tobacco or  illicit drugs  Current medications and supplements including opioid prescriptions. Patient is not currently taking opioid prescriptions. Functional ability and status Nutritional status Physical activity Advanced directives List of other physicians Hospitalizations, surgeries, and ER visits in previous 12 months Vitals Screenings to include cognitive, depression, and falls Referrals and appointments  In addition, I have reviewed and discussed with patient certain preventive protocols, quality metrics, and best practice recommendations. A written personalized care plan for preventive services as well as general preventive health recommendations were provided to patient.    Sue Lush, LPN   16/11/958   After Visit Summary: (MyChart) Due to this being a telephonic visit, the after visit summary with patients personalized plan was offered to patient via MyChart   Nurse Notes: The patient states she is doing well and has no concerns or questions at this time.

## 2023-10-16 ENCOUNTER — Other Ambulatory Visit: Payer: Self-pay | Admitting: Internal Medicine

## 2023-10-16 ENCOUNTER — Encounter: Payer: Self-pay | Admitting: Internal Medicine

## 2023-10-16 ENCOUNTER — Ambulatory Visit (INDEPENDENT_AMBULATORY_CARE_PROVIDER_SITE_OTHER): Payer: Medicare Other | Admitting: Internal Medicine

## 2023-10-16 VITALS — BP 136/84 | HR 77 | Ht 61.0 in | Wt 177.0 lb

## 2023-10-16 DIAGNOSIS — E1165 Type 2 diabetes mellitus with hyperglycemia: Secondary | ICD-10-CM

## 2023-10-16 DIAGNOSIS — Z794 Long term (current) use of insulin: Secondary | ICD-10-CM

## 2023-10-16 DIAGNOSIS — N1832 Chronic kidney disease, stage 3b: Secondary | ICD-10-CM | POA: Diagnosis not present

## 2023-10-16 DIAGNOSIS — E1122 Type 2 diabetes mellitus with diabetic chronic kidney disease: Secondary | ICD-10-CM

## 2023-10-16 LAB — POCT GLYCOSYLATED HEMOGLOBIN (HGB A1C): Hemoglobin A1C: 7.5 % — AB (ref 4.0–5.6)

## 2023-10-16 MED ORDER — ACCU-CHEK AVIVA PLUS VI STRP: 1.0000 | ORAL_STRIP | Freq: Three times a day (TID) | 3 refills | Status: DC

## 2023-10-16 NOTE — Patient Instructions (Addendum)
Continue  Lantus 26 units daily  Change Humalog 20 units with each meal  Snack time Humalog 10-16 units with a bedtime snack  Humalog correctional insulin: ADD extra units on insulin to your meal-time Humalog dose if your blood sugars are higher than 160. Use the scale below to help guide you:   Blood sugar before meal Number of units to inject  Less than 160 0 unit  161 - 190 1 unit  191 - 220 2 units  221 - 250 3 units  251 - 280 4 units  281 - 310 5 units  311 - 340 6 units  341 - 370 7 units  371 - 400 8 units     HOW TO TREAT LOW BLOOD SUGARS (Blood sugar LESS THAN 70 MG/DL) Please follow the RULE OF 15 for the treatment of hypoglycemia treatment (when your (blood sugars are less than 70 mg/dL)   STEP 1: Take 15 grams of carbohydrates when your blood sugar is low, which includes:  3-4 GLUCOSE TABS  OR 3-4 OZ OF JUICE OR REGULAR SODA OR ONE TUBE OF GLUCOSE GEL    STEP 2: RECHECK blood sugar in 15 MINUTES STEP 3: If your blood sugar is still low at the 15 minute recheck --> then, go back to STEP 1 and treat AGAIN with another 15 grams of carbohydrates.

## 2023-10-16 NOTE — Progress Notes (Signed)
Name: Katherine Oliver  Age/ Sex: 77 y.o., female   MRN/ DOB: [id number], [birth year]     PCP: Eustaquio Boyden, MD   Reason for Endocrinology Evaluation: Type 2 Diabetes Mellitus  Initial Endocrine Consultative Visit: 11/25/2021    PATIENT IDENTIFIER: [name] is a 77 y.o. female with a past medical history of DM, HTN, Hx of pancreatitis . The patient has followed with Endocrinology clinic since 11/25/2021 for consultative assistance with management of her diabetes.  DIABETIC HISTORY:  [name] was diagnosed with DM 2015, and started insulin therapy in 2021.Pt has Hx of pancreatitis . Her hemoglobin A1c has ranged from 6.5% in 2016, peaking at 8.3% in 2023.   Hx of DKA in 2021  Saw Dr. Everardo All from  2016 until 01/2022  SUBJECTIVE:   During the last visit (04/15/2023): A1c 7.3%  Today (10/16/2023): [name]  She checks her blood sugars multiple  times daily, through CGM. The patient has not had hypoglycemic episodes since the last clinic visit.  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  HOME DIABETES REGIMEN:  Lantus 26 units daily  Humalog 18/18/20 TIDQAC Humalog 10-16 units with bedtime snack CF : Humalog (BG-130/30)     Statin: yes ACE-I/ARB: yes     CONTINUOUS GLUCOSE MONITORING RECORD INTERPRETATION    Dates of Recording: 11/30-12/13/2024  Sensor description:dexcom  Results statistics:   CGM use % of time 93  Average and SD 198/47  Time in range 34%  % Time Above 180 53  % Time above 250 13  % Time Below target 0    Glycemic patterns summary: BG's trend down to just below the upper limit of normal, and fluctuate during the day Hyperglycemic episodes  postprandial   Hypoglycemic episodes occurred during the day after bolus  Overnight periods: Variable, but mostly optimal      DIABETIC  COMPLICATIONS: Microvascular complications:  CKD III Denies:  Last Eye Exam: Completed 03/24/2022  Macrovascular complications:   Denies: CAD, CVA, PVD   HISTORY:  Past Medical History:  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 nephropathy (HCC)    DSME at [facility] 01/2016    COVID-19 virus infection 11/10/2022   DKA (diabetic ketoacidoses) 04/22/2020   Frequent epistaxis 05/16/2019   S/p cauterization with resolution 2020   GERD (gastroesophageal reflux disease)    Hepatic steatosis    by abd [place] 05/2012, mild transaminitis - normal iron sat and viral hep panel (2011), stable [place] 2017   History of chicken pox    History of measles    History of recurrent UTIs    on chronic keflex   HLD (hyperlipidemia)    HTN (hypertension)    Hypertensive retinopathy of both eyes, 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   Bulakowski   Kidney cyst, acquired 01/2016   L kidney by US   Kidney stone 01/2016   L kidney by [place]   Lung nodules 11/2013   overall stable on f/u CT 01/2016   Osteopenia 06/2013   mild, forearm T -1.1, hip and spine WNL   Pancreatitis    Polycythemia    mild, stable (2013)  Primary localized osteoarthritis of right knee 01/05/2019   Rosacea    metrogel   Past Surgical History:  Past Surgical History:  Procedure Laterality Date   APPENDECTOMY  1987   BILIARY STENT PLACEMENT  12/14/2019   Procedure: BILIARY STENT PLACEMENT;  Surgeon: Jeani Hawking, MD;  Location: [facility] ENDOSCOPY;  Service: Endoscopy;;   BREAST BIOPSY Right 1963   benign   BREAST BIOPSY Right 08/2014   benign- core   cardiolite stress test  04/2004   normal   CESAREAN SECTION  7829;5621   x2   CHOLECYSTECTOMY  2003   COLONOSCOPY  09/26/2008   adenomatous polyp, rpt 3-5 yrs   COLONOSCOPY  08/2012   adenomatous polyps,  diverticulosis, rec rpt 5 yrs Marva Panda)   COLONOSCOPY WITH PROPOFOL N/A 02/05/2018   4TA, SSA, diverticulosis, rpt 3 yrs Marva Panda, Cindra Eves, MD)   COLONOSCOPY WITH PROPOFOL N/A 10/14/2022   fair prep, TAx2 one was 1cm in size, diverticulosis, rpt 1 yr given fair prep Mia Creek, Rossie Muskrat, MD)   dexa  2003   normal   dexa  06/2013   [facility] - Tscore -1.1 forearm, normal spine and femur   ERCP N/A 12/14/2019   Procedure: ENDOSCOPIC RETROGRADE CHOLANGIOPANCREATOGRAPHY (ERCP);  Surgeon: Jeani Hawking, MD;  Location: [facility] ENDOSCOPY;  Service: Endoscopy;  Laterality: N/A;   ERCP  01/2020   nonbleeding gastric ulcer - [facility] hospitalization (Dr Shana Chute)   ESOPHAGOGASTRODUODENOSCOPY N/A 12/19/2019   Procedure: ESOPHAGOGASTRODUODENOSCOPY (EGD);  Surgeon: Charna Elizabeth, MD;  Location: [facility] ENDOSCOPY;  Service: Endoscopy;  Laterality: N/A;   ESOPHAGOGASTRODUODENOSCOPY  01/2020   pre existing AXIOS cystogastrostomy stent s/p necrosectomy - [facility] hospitalization (Dr Shana Chute)   ESOPHAGOGASTRODUODENOSCOPY N/A 04/24/2020   Procedure: ESOPHAGOGASTRODUODENOSCOPY (EGD);  Surgeon: Graylin Shiver, MD;  Location: [facility] ENDOSCOPY;  Service: Endoscopy;  Laterality: N/A;   ESOPHAGOGASTRODUODENOSCOPY  05/2020   normal esophagus, duodenum, erythematous mucosa in gastric body, gastric cystogastrostomy stent removed ([facility])   IR FLUORO GUIDE CV LINE RIGHT  12/27/2019   IR FLUORO GUIDE CV LINE RIGHT  04/24/2020   IR IVC FILTER PLMT / S&I /IMG GUID/MOD SED  04/26/2020   IR IVC FILTER RETRIEVAL / S&I /IMG GUID/MOD SED  07/31/2021   IR RADIOLOGIST EVAL & MGMT  07/31/2020   IR RADIOLOGIST EVAL & MGMT  01/15/2021   IR RADIOLOGIST EVAL & MGMT  06/19/2021   IR REMOVAL TUN CV CATH W/O FL  01/03/2020   IR REMOVAL TUN CV CATH W/O FL  04/29/2020   IR REPLC DUODEN/JEJUNO TUBE PERCUT W/FLUORO  04/01/2020   IR US GUIDE VASC ACCESS RIGHT  04/24/2020   SPHINCTEROTOMY  12/14/2019   Procedure: SPHINCTEROTOMY;  Surgeon: Jeani Hawking, MD;   Location: [facility] ENDOSCOPY;  Service: Endoscopy;;   TOTAL KNEE ARTHROPLASTY Right 01/17/2019   Procedure: TOTAL KNEE ARTHROPLASTY;  Surgeon: Salvatore Marvel, MD;  Location: [facility] ORS;  Service: Orthopedics;  Laterality: Right;   TRANSTHORACIC ECHOCARDIOGRAM  04/2019   EF 55-60%, modLVH, impaired relaxation    TRIGGER FINGER RELEASE  2007;2010;2011   bilateral   TRIGGER FINGER RELEASE  02/2017   VAGINAL HYSTERECTOMY  1984   for menorrhagia, ovaries in place   Social History:  reports that she has never smoked. She has never been exposed to tobacco smoke. She has never used smokeless tobacco. She reports that she does not currently use alcohol. She reports that she does not use drugs. Family History:  Family History  Problem Relation Age of Onset   Stroke Mother  several   Hyperlipidemia Mother    Hypertension Mother    Cancer Father        colon   Hypertension Father    Hyperlipidemia Father    Coronary artery disease Father 11       MIx1, CABG   Cancer Paternal Aunt        abdominal   Coronary artery disease Maternal Grandmother    Diabetes Maternal Grandfather    Coronary artery disease Maternal Grandfather    Breast cancer Neg Hx      HOME MEDICATIONS: Allergies as of 10/16/2023       Reactions   Azithromycin Itching   Okay if takes benadryl along with it   Nickel    Reaction to cheap earrings cause redness   Sulfa Antibiotics Itching   Vinegar [acetic Acid] Nausea Only   Adhesive [tape] Rash   Paper tape only causes blisters   Keflex [cephalexin] Rash        Medication List        Accurate as of October 16, 2023  9:52 AM. If you have any questions, ask your nurse or doctor.          Accu-Chek Aviva Plus test strip Generic drug: glucose blood 1 each by Other route 4 (four) times daily. And lancets 4/day   acetaminophen 500 MG tablet Commonly known as: TYLENOL Take 500 mg by mouth 2 (two) times daily.   amLODipine 10 MG tablet Commonly known as:  NORVASC Take 0.5 tablets (5 mg total) by mouth daily.   B-D UF III MINI PEN NEEDLES 31G X 5 MM Misc Generic drug: Insulin Pen Needle INJECT 1 DEVICE INTO THE SKIN 4 (FOUR) TIMES DAILY.   Baqsimi One Pack 3 MG/DOSE Powd Generic drug: Glucagon Place 3 mg into the nose daily as needed (hypoglycemia).   benazepril-hydrochlorthiazide 20-12.5 MG tablet Commonly known as: LOTENSIN HCT TAKE 1 TABLET BY MOUTH DAILY   CRANBERRY PO Take 4,200 mg by mouth 2 (two) times daily.   CRANBERRY-VITAMIN C-D MANNOSE PO Take 1,500 mg by mouth daily.   Creon 36000-114000 units Cpep capsule Generic drug: lipase/protease/amylase TAKE 2 CAPSULES BY MOUTH 3 TIMES DAILY WITH MEALS   CULTURELLE WOMENS 4 IN 1 PO Take by mouth daily.   D-MANNOSE PO Take 2 tablets by mouth daily with lunch.   Dexcom G6 Sensor Misc 1 Device by Other route See admin instructions. Change every 10 days   diphenhydrAMINE 25 MG tablet Commonly known as: BENADRYL Take 25 mg by mouth 2 (two) times daily as needed for allergies.   estradiol 0.1 MG/GM vaginal cream Commonly known as: ESTRACE (DISCARD APPLICATOR) APPLY PEA SIZED AMOUNT TO TIP OF FINGER TO URETHRA BEFORE BED. WASH HANDS WELL AFTER APPLICATION. USE MONDAY, WEDNESDAY AND FRIDAY   ferrous sulfate 325 (65 FE) MG tablet Take 1 tablet (325 mg total) by mouth every other day.   fluticasone 50 MCG/ACT nasal spray Commonly known as: FLONASE Place 1 spray into both nostrils daily.   folic acid 1 MG tablet Commonly known as: FOLVITE Take 1 tablet (1 mg total) by mouth daily.   HumaLOG KwikPen 200 UNIT/ML KwikPen Generic drug: insulin lispro Max daily 100 units   insulin glargine 100 UNIT/ML Solostar Pen Commonly known as: LANTUS Inject 26 Units into the skin at bedtime.   loperamide 2 MG tablet Commonly known as: IMODIUM A-D Take 4 mg by mouth 4 (four) times daily as needed for diarrhea or loose stools.   meclizine 25  MG tablet Commonly known as:  ANTIVERT Take 1 tablet (25 mg total) by mouth 3 (three) times daily as needed for nausea.   metoprolol succinate 50 MG 24 hr tablet Commonly known as: TOPROL-XL TAKE 1 TABLET BY MOUTH DAILY  WITH OR IMMEDIATELY FOLLOWING A  MEAL   multivitamin with minerals Tabs tablet Take 1 tablet by mouth daily.   pantoprazole 40 MG tablet Commonly known as: PROTONIX Take 1 tablet (40 mg total) by mouth every evening.   rosuvastatin 10 MG tablet Commonly known as: CRESTOR Take 1 tablet (10 mg total) by mouth daily.   triamcinolone 0.1 % paste Commonly known as: KENALOG APPLY SPARINGLY 2-3 TIMES DAILY AS DIRECTED   Vitamin D3 25 MCG (1000 UT) Caps Take 2 capsules (2,000 Units total) by mouth daily.         OBJECTIVE:   Vital Signs: BP 136/84 (BP Location: Left Arm, Patient Position: Sitting, Cuff Size: Large)   Pulse 77   Ht 5\' 1"  (1.549 m)   Wt 177 lb (80.3 kg)   SpO2 95%   BMI 33.44 kg/m   Wt Readings from Last 3 Encounters:  10/16/23 177 lb (80.3 kg)  10/12/23 176 lb 9.6 oz (80.1 kg)  05/18/23 171 lb (77.6 kg)     Exam: General: Pt appears well and is in NAD  Lungs: Clear with good BS bilat   Heart: RRR   Extremities: No pretibial edema.  Neuro: MS is good with appropriate affect, pt is alert and Ox3    DM foot exam:04/15/2023  The skin of the feet is intact without sores or ulcerations. The pedal pulses are 2+ on right and 2+ on left. The sensation is intact to a screening 5.07, 10 gram monofilament bilaterally        DATA REVIEWED:  Lab Results  Component Value Date   HGBA1C 7.3 (A) 04/15/2023   HGBA1C 7.7 (A) 11/21/2022   HGBA1C 7.6 (H) 11/17/2022    Latest Reference Range & Units 05/28/23 08:43  Sodium 135 - 145 mEq/L 139  Potassium 3.5 - 5.1 mEq/L 4.2  Chloride 96 - 112 mEq/L 104  CO2 19 - 32 mEq/L 25  Glucose 70 - 99 mg/dL 098 (H)  BUN 6 - 23 mg/dL 31 (H)  Creatinine 1.19 - 1.20 mg/dL 1.47 (H)  Calcium 8.4 - 10.5 mg/dL 9.7  Phosphorus 2.3 -  4.6 mg/dL 3.1  Albumin 3.5 - 5.2 g/dL 4.4  GFR >82.95 mL/min 38.59 (L)  (H): Data is abnormally high (L): Data is abnormally low  ASSESSMENT / PLAN / RECOMMENDATIONS:   1) Type 2 Diabetes Mellitus, Sub-Optimally controlled, With CKD III complications - Most recent A1c of 7.5 %. Goal A1c < 7.5 %.    -A1c has slightly increased -In reviewing CGM download, patient has been noted postprandial hyperglycemia -Emphasized the importance of taking prandial insulin with each meal rather than after the meal -She is NOT a candidate for GLP-1 agonist, DPP 4 inhibitors nor Mounjaro due to history of pancreatitis -Limited glycemic agents due to CKD -Not a candidate for SGLT2 inhibitors due to history of recurrent UTIs -I will increase NovoLog with each meal as below -No changes to basal insulin -Accu-Chek every hour plus test strips were sent to the pharmacy  MEDICATIONS: Continue Lantus 26 units daily Change Humalog 20 units with each meal Continue Humalog 10-16 units with bedtime snack Continue  correction factor: Humalog (BG -130/30)  EDUCATION / INSTRUCTIONS: BG monitoring instructions: Patient is instructed to check  her blood sugars 4 times a day, before each meal ad bedtime . Call [facility] if: BG persistently < 70  I reviewed the Rule of 15 for the treatment of hypoglycemia in detail with the patient. Literature supplied.     2) Diabetic complications:  Eye: Does not have known diabetic retinopathy.  Neuro/ Feet: Does not have known diabetic peripheral neuropathy .  Renal: Patient does  have known baseline CKD. She   is  on an ACEI/ARB at present.      F/U in 6 months   Signed electronically by: Lyndle Herrlich, MD  [facility]  [facility] [address] [place] [place], [place] [address] Phone: [phone] FAX: [phone]   CC: Eustaquio Boyden, MD [address] [place] [address] Phone:  [phone]  Fax: [phone]  Return to Endocrinology clinic as below: Future Appointments  Date Time Provider Department Center  11/13/2023  9:00 AM [facility] LAB [facility]  11/20/2023 10:00 AM Eustaquio Boyden, MD [facility]  10/12/2024 11:30 AM [facility] ANNUAL WELLNESS VISIT 1 [facility]

## 2023-10-31 ENCOUNTER — Other Ambulatory Visit: Payer: Self-pay | Admitting: Family Medicine

## 2023-10-31 DIAGNOSIS — K219 Gastro-esophageal reflux disease without esophagitis: Secondary | ICD-10-CM

## 2023-11-02 NOTE — Telephone Encounter (Signed)
E-scribed pantoprazole refill.  Folic acid Last filled:  09/06/23, #90 Last OV:  05/18/23, 6 mo f/u Next OV:  11/20/23, CPE

## 2023-11-07 ENCOUNTER — Other Ambulatory Visit: Payer: Self-pay | Admitting: Family Medicine

## 2023-11-07 DIAGNOSIS — M85852 Other specified disorders of bone density and structure, left thigh: Secondary | ICD-10-CM

## 2023-11-07 DIAGNOSIS — E1169 Type 2 diabetes mellitus with other specified complication: Secondary | ICD-10-CM

## 2023-11-07 DIAGNOSIS — E119 Type 2 diabetes mellitus without complications: Secondary | ICD-10-CM

## 2023-11-07 DIAGNOSIS — E1122 Type 2 diabetes mellitus with diabetic chronic kidney disease: Secondary | ICD-10-CM

## 2023-11-07 DIAGNOSIS — D751 Secondary polycythemia: Secondary | ICD-10-CM

## 2023-11-07 DIAGNOSIS — K760 Fatty (change of) liver, not elsewhere classified: Secondary | ICD-10-CM

## 2023-11-07 DIAGNOSIS — D509 Iron deficiency anemia, unspecified: Secondary | ICD-10-CM

## 2023-11-13 ENCOUNTER — Other Ambulatory Visit (INDEPENDENT_AMBULATORY_CARE_PROVIDER_SITE_OTHER): Payer: Medicare Other

## 2023-11-13 DIAGNOSIS — D751 Secondary polycythemia: Secondary | ICD-10-CM

## 2023-11-13 DIAGNOSIS — K760 Fatty (change of) liver, not elsewhere classified: Secondary | ICD-10-CM | POA: Diagnosis not present

## 2023-11-13 DIAGNOSIS — E1122 Type 2 diabetes mellitus with diabetic chronic kidney disease: Secondary | ICD-10-CM

## 2023-11-13 DIAGNOSIS — E785 Hyperlipidemia, unspecified: Secondary | ICD-10-CM | POA: Diagnosis not present

## 2023-11-13 DIAGNOSIS — E119 Type 2 diabetes mellitus without complications: Secondary | ICD-10-CM | POA: Diagnosis not present

## 2023-11-13 DIAGNOSIS — E1169 Type 2 diabetes mellitus with other specified complication: Secondary | ICD-10-CM | POA: Diagnosis not present

## 2023-11-13 DIAGNOSIS — Z794 Long term (current) use of insulin: Secondary | ICD-10-CM | POA: Diagnosis not present

## 2023-11-13 DIAGNOSIS — D509 Iron deficiency anemia, unspecified: Secondary | ICD-10-CM

## 2023-11-13 DIAGNOSIS — M85852 Other specified disorders of bone density and structure, left thigh: Secondary | ICD-10-CM

## 2023-11-13 DIAGNOSIS — N183 Chronic kidney disease, stage 3 unspecified: Secondary | ICD-10-CM

## 2023-11-13 LAB — CBC WITH DIFFERENTIAL/PLATELET
Basophils Absolute: 0 10*3/uL (ref 0.0–0.1)
Basophils Relative: 0.6 % (ref 0.0–3.0)
Eosinophils Absolute: 0 10*3/uL (ref 0.0–0.7)
Eosinophils Relative: 0 % (ref 0.0–5.0)
HCT: 43.2 % (ref 36.0–46.0)
Hemoglobin: 13.8 g/dL (ref 12.0–15.0)
Lymphocytes Relative: 25.8 % (ref 12.0–46.0)
Lymphs Abs: 1.4 10*3/uL (ref 0.7–4.0)
MCHC: 32 g/dL (ref 30.0–36.0)
MCV: 83 fL (ref 78.0–100.0)
Monocytes Absolute: 0.5 10*3/uL (ref 0.1–1.0)
Monocytes Relative: 8.8 % (ref 3.0–12.0)
Neutro Abs: 3.5 10*3/uL (ref 1.4–7.7)
Neutrophils Relative %: 64.8 % (ref 43.0–77.0)
Platelets: 127 10*3/uL — ABNORMAL LOW (ref 150.0–400.0)
RBC: 5.2 Mil/uL — ABNORMAL HIGH (ref 3.87–5.11)
RDW: 16.3 % — ABNORMAL HIGH (ref 11.5–15.5)
WBC: 5.4 10*3/uL (ref 4.0–10.5)

## 2023-11-13 LAB — LIPID PANEL
Cholesterol: 158 mg/dL (ref 0–200)
HDL: 44.4 mg/dL (ref >39.00)
LDL Cholesterol: 54 mg/dL (ref 0–99)
NonHDL: 114.05
Total CHOL/HDL Ratio: 4
Triglycerides: 301 mg/dL — ABNORMAL HIGH (ref 0.0–149.0)
VLDL: 60.2 mg/dL — ABNORMAL HIGH (ref 0.0–40.0)

## 2023-11-13 LAB — COMPREHENSIVE METABOLIC PANEL
ALT: 28 U/L (ref 0–35)
AST: 24 U/L (ref 0–37)
Albumin: 4.5 g/dL (ref 3.5–5.2)
Alkaline Phosphatase: 72 U/L (ref 39–117)
BUN: 28 mg/dL — ABNORMAL HIGH (ref 6–23)
CO2: 30 meq/L (ref 19–32)
Calcium: 8.8 mg/dL (ref 8.4–10.5)
Chloride: 102 meq/L (ref 96–112)
Creatinine, Ser: 1.29 mg/dL — ABNORMAL HIGH (ref 0.40–1.20)
GFR: 39.54 mL/min — ABNORMAL LOW (ref >60.00)
Glucose, Bld: 216 mg/dL — ABNORMAL HIGH (ref 70–99)
Potassium: 4.3 meq/L (ref 3.5–5.1)
Sodium: 141 meq/L (ref 135–145)
Total Bilirubin: 0.6 mg/dL (ref 0.2–1.2)
Total Protein: 6.6 g/dL (ref 6.0–8.3)

## 2023-11-13 LAB — IBC PANEL
Iron: 54 ug/dL (ref 42–145)
Saturation Ratios: 17.8 % — ABNORMAL LOW (ref 20.0–50.0)
TIBC: 303.8 ug/dL (ref 250.0–450.0)
Transferrin: 217 mg/dL (ref 212.0–360.0)

## 2023-11-13 LAB — VITAMIN B12: Vitamin B-12: 429 pg/mL (ref 211–911)

## 2023-11-13 LAB — VITAMIN D 25 HYDROXY (VIT D DEFICIENCY, FRACTURES): VITD: 24.32 ng/mL — ABNORMAL LOW (ref 30.00–100.00)

## 2023-11-13 LAB — TSH: TSH: 3.5 u[IU]/mL (ref 0.35–5.50)

## 2023-11-13 LAB — FERRITIN: Ferritin: 252.6 ng/mL (ref 10.0–291.0)

## 2023-11-13 NOTE — Addendum Note (Signed)
 Addended by: Alvina Chou on: 11/13/2023 09:23 AM   Modules accepted: Orders

## 2023-11-16 ENCOUNTER — Other Ambulatory Visit: Payer: Self-pay

## 2023-11-16 DIAGNOSIS — N183 Chronic kidney disease, stage 3 unspecified: Secondary | ICD-10-CM

## 2023-11-16 DIAGNOSIS — E119 Type 2 diabetes mellitus without complications: Secondary | ICD-10-CM

## 2023-11-16 LAB — MICROALBUMIN / CREATININE URINE RATIO
Creatinine,U: 36.1 mg/dL
Microalb Creat Ratio: 166.3 mg/g — ABNORMAL HIGH (ref 0.0–30.0)
Microalb, Ur: 60 mg/dL — ABNORMAL HIGH (ref 0.0–1.9)

## 2023-11-20 ENCOUNTER — Ambulatory Visit
Admission: RE | Admit: 2023-11-20 | Discharge: 2023-11-20 | Disposition: A | Discharge status: Home / Self Care | Payer: Medicare Other | Source: Ambulatory Visit | Attending: Family Medicine | Admitting: Family Medicine

## 2023-11-20 ENCOUNTER — Encounter: Payer: Self-pay | Admitting: Family Medicine

## 2023-11-20 ENCOUNTER — Ambulatory Visit (INDEPENDENT_AMBULATORY_CARE_PROVIDER_SITE_OTHER): Payer: Medicare Other | Admitting: Family Medicine

## 2023-11-20 VITALS — BP 162/80 | HR 80 | Temp 97.9°F | Ht 60.5 in | Wt 178.2 lb

## 2023-11-20 DIAGNOSIS — Z711 Person with feared health complaint in whom no diagnosis is made: Secondary | ICD-10-CM | POA: Diagnosis not present

## 2023-11-20 DIAGNOSIS — M85852 Other specified disorders of bone density and structure, left thigh: Secondary | ICD-10-CM | POA: Diagnosis not present

## 2023-11-20 DIAGNOSIS — M19012 Primary osteoarthritis, left shoulder: Secondary | ICD-10-CM | POA: Diagnosis not present

## 2023-11-20 DIAGNOSIS — E785 Hyperlipidemia, unspecified: Secondary | ICD-10-CM

## 2023-11-20 DIAGNOSIS — Z87440 Personal history of urinary (tract) infections: Secondary | ICD-10-CM

## 2023-11-20 DIAGNOSIS — K219 Gastro-esophageal reflux disease without esophagitis: Secondary | ICD-10-CM

## 2023-11-20 DIAGNOSIS — N1832 Chronic kidney disease, stage 3b: Secondary | ICD-10-CM | POA: Diagnosis not present

## 2023-11-20 DIAGNOSIS — I82411 Acute embolism and thrombosis of right femoral vein: Secondary | ICD-10-CM

## 2023-11-20 DIAGNOSIS — M79622 Pain in left upper arm: Secondary | ICD-10-CM | POA: Diagnosis not present

## 2023-11-20 DIAGNOSIS — Z794 Long term (current) use of insulin: Secondary | ICD-10-CM

## 2023-11-20 DIAGNOSIS — I10 Essential (primary) hypertension: Secondary | ICD-10-CM

## 2023-11-20 DIAGNOSIS — Z7189 Other specified counseling: Secondary | ICD-10-CM

## 2023-11-20 DIAGNOSIS — E1169 Type 2 diabetes mellitus with other specified complication: Secondary | ICD-10-CM | POA: Diagnosis not present

## 2023-11-20 DIAGNOSIS — K861 Other chronic pancreatitis: Secondary | ICD-10-CM

## 2023-11-20 DIAGNOSIS — D509 Iron deficiency anemia, unspecified: Secondary | ICD-10-CM

## 2023-11-20 DIAGNOSIS — W458XXA Other foreign body or object entering through skin, initial encounter: Secondary | ICD-10-CM

## 2023-11-20 DIAGNOSIS — R801 Persistent proteinuria, unspecified: Secondary | ICD-10-CM

## 2023-11-20 DIAGNOSIS — E1165 Type 2 diabetes mellitus with hyperglycemia: Secondary | ICD-10-CM

## 2023-11-20 DIAGNOSIS — I700 Atherosclerosis of aorta: Secondary | ICD-10-CM

## 2023-11-20 DIAGNOSIS — K8689 Other specified diseases of pancreas: Secondary | ICD-10-CM

## 2023-11-20 DIAGNOSIS — E1122 Type 2 diabetes mellitus with diabetic chronic kidney disease: Secondary | ICD-10-CM

## 2023-11-20 LAB — POC URINALSYSI DIPSTICK (AUTOMATED)
Bilirubin, UA: NEGATIVE
Blood, UA: NEGATIVE
Glucose, UA: NEGATIVE
Ketones, UA: NEGATIVE
Leukocytes, UA: NEGATIVE
Nitrite, UA: NEGATIVE
Protein, UA: POSITIVE — AB
Spec Grav, UA: 1.025 (ref 1.010–1.025)
Urobilinogen, UA: 0.2 U/dL
pH, UA: 6 (ref 5.0–8.0)

## 2023-11-20 LAB — URINALYSIS, ROUTINE W REFLEX MICROSCOPIC
Bilirubin Urine: NEGATIVE
Hgb urine dipstick: NEGATIVE
Ketones, ur: NEGATIVE
Leukocytes,Ua: NEGATIVE
Nitrite: NEGATIVE
Specific Gravity, Urine: 1.025 (ref 1.000–1.030)
Total Protein, Urine: 100 — AB
Urine Glucose: NEGATIVE
Urobilinogen, UA: 0.2 (ref 0.0–1.0)
pH: 5.5 (ref 5.0–8.0)

## 2023-11-20 MED ORDER — PANTOPRAZOLE SODIUM 40 MG PO TBEC: 40.0000 mg | DELAYED_RELEASE_TABLET | Freq: Every evening | ORAL | 4 refills | Status: AC

## 2023-11-20 MED ORDER — CREON 36000-114000 UNITS PO CPEP: 72000.0000 [IU] | ORAL_CAPSULE | Freq: Three times a day (TID) | ORAL | 3 refills | Status: DC

## 2023-11-20 MED ORDER — AMLODIPINE BESYLATE 5 MG PO TABS: 5.0000 mg | ORAL_TABLET | Freq: Every day | ORAL | 4 refills | Status: DC

## 2023-11-20 MED ORDER — DIPHENHYDRAMINE HCL 25 MG PO TABS: 25.0000 mg | ORAL_TABLET | Freq: Every evening | ORAL | Status: DC

## 2023-11-20 MED ORDER — METOPROLOL SUCCINATE ER 50 MG PO TB24: ORAL_TABLET | ORAL | 4 refills | Status: DC

## 2023-11-20 MED ORDER — ROSUVASTATIN CALCIUM 10 MG PO TABS: 10.0000 mg | ORAL_TABLET | Freq: Every day | ORAL | 4 refills | Status: DC

## 2023-11-20 NOTE — Assessment & Plan Note (Addendum)
Previously discussed.

## 2023-11-20 NOTE — Patient Instructions (Addendum)
Update xray of left upper arm  Urinalysis today  Pass by lab to pick up urine 24 hour collection test.  Good to see you today If interested, check with pharmacy about new 2 shot shingles series (shingrix).  Return as needed or in 3-4 months for follow up visit.

## 2023-11-20 NOTE — Progress Notes (Unsigned)
Ph: [phone] Fax: [phone]   Patient ID: Katherine Oliver, female    DOB: 16-Feb-1946, 78 y.o.   MRN: [id number]  This visit was conducted in person.  BP (!) 162/80   Pulse 80   Temp 97.9 F (36.6 C) (Oral)   Ht 5' 0.5" (1.537 m)   Wt 178 lb 4 oz (80.9 kg)   SpO2 (!) 83%   BMI 34.24 kg/m   BP Readings from Last 3 Encounters:  11/20/23 (!) 162/80  10/16/23 136/84  10/12/23 128/78  Rechecked BP: ***  CC: AMW f/u visit  Subjective:   HPI: Katherine Oliver is a 78 y.o. female presenting on 11/20/2023 for Annual Exam (MCR prt 2 [AWV- 10/12/23]. Pt accompanied by husband, Casimiro Needle. )   Saw health advisor 10/2023 for medicare wellness visit. Note reviewed.   No results found.  Flowsheet Row Clinical Support from 10/12/2023 in [facility]  PHQ-2 Total Score 0          10/09/2023    7:13 PM 05/18/2023   10:57 AM 11/17/2022   12:01 PM 10/08/2022    3:40 PM 10/03/2021    2:08 PM  Fall Risk   Falls in the past year? 0 0 0 0 0  Number falls in past yr: 0   0 0  Injury with Fall? 0   0 0  Risk for fall due to : No Fall Risks   No Fall Risks No Fall Risks  Follow up Education provided;Falls prevention discussed   Falls prevention discussed;Falls evaluation completed Falls prevention discussed    Prolonged hospitalization for complicated necrotizing pancreatitis with residual chronic diarrhea (pos fecal fat, markedly low fecal elastase, C diff returned positive as well, completed 10d oral vanc 05/2020). Since then on creon 72000u TIDAC through endo. H/o lone afib once during prolonged hospitalization, no trouble since.   2 wk h/o L heel pain - using more cushioned shoes with benefit. No redness or swelling. Denies inciting falls or injury.    HTN - BP elevated today. Home readings normally well controlled 130/70s. Continues benazepril hydrochlorothiazide 20/12.5mg  daily, amlodipine 5mg  daily, toprol XL 50mg  daily. Ankle  swelling is better on lower dose.  DM - sees [facility] Dr Lonzo Cloud, last 10/2023. Continues using Dexcom D6 continuous glucose monitor. Also has CKD. Currently on humalog 20u TID AC with humalog 10-16u for bedtime snack, as well as basal lantus 26u daily.  20 days ago Dexcom G7 sensor needle broke off in skin. No pain, redness, swelling overall asxs.   Recurrent UTI - sees urology, taking d mannitose and cranberry tablets preventatively with success. No current UTI symptoms.    Preventative: COLONOSCOPY WITH PROPOFOL 02/05/2018 - 4 TA, SSA, diverticulosis, rpt 3 yrs Marva Panda, Cindra Eves, MD)  Colonoscopy 10/2022 - fair prep, TAx2 one was 1cm in size, diverticulosis, consider rpt 1-2 yrs given fair prep Mia Creek, Rossie Muskrat, MD) - upcoming appt 01/2024 Mammo 09/2023 - Birads1 @ [place]  Pap smear - stopped around 78yo, s/p hysterectomy for heavy bleeding. Both ovaries remain. Aged out  Dexa Date: 06/2013 [facility] - Tscore -1.1 forearm, normal spine and femur - will repeat this year.  DEXA 09/2019: T -0.3 spine, -1.3 L hip.  DEXA 09/2022 - T -1.4 L forearm, -1.2 L femur neck, not at increased risk of fracture per FRAX - sees endo Dr Lonzo Cloud  Lung cancer screening - not eligible  Flu shot yearly COVID vaccine Pfizer 11/2019, 12/2019. They wonder  about autoimmune reaction to Pfizer leading to pancreas issue. She doesn't want to get booster for this reason.  Pneumovax 05/2012, prevnar-13 2015  Td 2006  Zostavax 2010  Shingrix - discussed - to consider  RSV - not done  Advanced directives: updated directives scanned 2022. Husband Casimiro Needle is HCPOA then sons Ines Bloomer and Chantall Berkowitz. Grants discretion to [employer] for end of life decisions.  Seat belt use discussed.  Sunscreen use discussed. No changing moles on skin. has seen dermatologist Gwen Pounds). 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 Some stress incontinence.   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 Walking 30 min several times a week.  Diet: ok water intake 4 glasses/day, daily fruits/vegetables, red meat 4x/wk, fish 3-4x/wk      Relevant past medical, surgical, family and social history reviewed and updated as indicated. Interim medical history since our last visit reviewed. Allergies and medications reviewed and updated. Outpatient Medications Prior to Visit  Medication Sig Dispense Refill   ACCU-CHEK AVIVA PLUS test strip 1 EACH BY OTHER ROUTE 3 (THREE) TIMES DAILY. AND LANCETS 4/DAY 300 strip 3   acetaminophen (TYLENOL) 500 MG tablet Take 500 mg by mouth 2 (two) times daily.     B-D UF III MINI PEN NEEDLES 31G X 5 MM MISC INJECT 1 DEVICE INTO THE SKIN 4 (FOUR) TIMES DAILY. 400 each 3   benazepril-hydrochlorthiazide (LOTENSIN HCT) 20-12.5 MG tablet TAKE 1 TABLET BY MOUTH DAILY 90 tablet 3   Cholecalciferol (VITAMIN D3) 25 MCG (1000 UT) CAPS Take 2 capsules (2,000 Units total) by mouth daily.     Continuous Blood Gluc Sensor (DEXCOM G6 SENSOR) MISC 1 Device by Other route See admin instructions. Change every 10 days 9 each 3   CRANBERRY PO Take 4,200 mg by mouth 2 (two) times daily.     D-MANNOSE PO Take 2 tablets by mouth daily with lunch.     estradiol (ESTRACE) 0.1 MG/GM vaginal cream (DISCARD APPLICATOR) APPLY PEA SIZED AMOUNT TO TIP OF FINGER TO URETHRA BEFORE BED. WASH HANDS WELL AFTER APPLICATION. USE MONDAY, WEDNESDAY AND FRIDAY 42.5 g 11   ferrous sulfate 325 (65 FE) MG tablet Take 1 tablet (325 mg total) by mouth every other day.     fluticasone (FLONASE) 50 MCG/ACT nasal spray Place 1 spray into both nostrils daily. 16 g 11   folic acid (FOLVITE) 1 MG tablet TAKE 1 TABLET BY MOUTH DAILY 90  tablet 3   Glucagon (BAQSIMI ONE PACK) 3 MG/DOSE POWD Place 3 mg into the nose daily as needed (hypoglycemia).     insulin glargine (LANTUS) 100 UNIT/ML Solostar Pen Inject 26 Units into the skin at bedtime. 30 mL 3   insulin lispro (HUMALOG KWIKPEN) 200 UNIT/ML KwikPen Max daily 100 units 90 mL 1   loperamide (IMODIUM A-D) 2 MG tablet Take 4 mg by mouth 4 (four) times daily as needed for diarrhea or loose stools.     meclizine (ANTIVERT) 25 MG tablet Take 1 tablet (25 mg total) by mouth 3 (three) times daily as needed for nausea. 30 tablet 0   Multiple Vitamin (MULTIVITAMIN WITH MINERALS) TABS tablet Take 1 tablet by mouth daily.  triamcinolone (KENALOG) 0.1 % paste APPLY SPARINGLY 2-3 TIMES DAILY AS DIRECTED 5 g 1   amLODipine (NORVASC) 10 MG tablet Take 0.5 tablets (5 mg total) by mouth daily.     CRANBERRY-VITAMIN C-D MANNOSE PO Take 1,500 mg by mouth daily.     CREON 36000-114000 units CPEP capsule TAKE 2 CAPSULES BY MOUTH 3 TIMES DAILY WITH MEALS 600 capsule 3   diphenhydrAMINE (BENADRYL) 25 MG tablet Take 25 mg by mouth 2 (two) times daily as needed for allergies.     metoprolol succinate (TOPROL-XL) 50 MG 24 hr tablet TAKE 1 TABLET BY MOUTH DAILY  WITH OR IMMEDIATELY FOLLOWING A  MEAL 90 tablet 4   pantoprazole (PROTONIX) 40 MG tablet TAKE 1 TABLET BY MOUTH IN THE  EVENING 90 tablet 0   rosuvastatin (CRESTOR) 10 MG tablet Take 1 tablet (10 mg total) by mouth daily. 90 tablet 4   Lactobacillus (CULTURELLE WOMENS 4 IN 1 PO) Take by mouth daily.     No facility-administered medications prior to visit.     Per HPI unless specifically indicated in ROS section below Review of Systems  Objective:  BP (!) 162/80   Pulse 80   Temp 97.9 F (36.6 C) (Oral)   Ht 5' 0.5" (1.537 m)   Wt 178 lb 4 oz (80.9 kg)   SpO2 (!) 83%   BMI 34.24 kg/m   Wt Readings from Last 3 Encounters:  11/20/23 178 lb 4 oz (80.9 kg)  10/16/23 177 lb (80.3 kg)  10/12/23 176 lb 9.6 oz (80.1 kg)       Physical Exam Vitals and nursing note reviewed.  Constitutional:      Appearance: Normal appearance. She is not ill-appearing.  HENT:     Head: Normocephalic and atraumatic.     Right Ear: Tympanic membrane, ear canal and external ear normal. There is no impacted cerumen.     Left Ear: Tympanic membrane, ear canal and external ear normal. There is no impacted cerumen.  Eyes:     General:        Right eye: No discharge.        Left eye: No discharge.     Extraocular Movements: Extraocular movements intact.     Conjunctiva/sclera: Conjunctivae normal.     Pupils: Pupils are equal, round, and reactive to light.  Neck:     Thyroid: No thyroid mass or thyromegaly.  Cardiovascular:     Rate and Rhythm: Normal rate and regular rhythm.     Pulses: Normal pulses.     Heart sounds: Normal heart sounds. No murmur heard. Pulmonary:     Effort: Pulmonary effort is normal. No respiratory distress.     Breath sounds: Normal breath sounds. No wheezing, rhonchi or rales.  Abdominal:     General: Bowel sounds are normal. There is no distension.     Palpations: Abdomen is soft. There is no mass.     Tenderness: There is no abdominal tenderness. There is no guarding or rebound.     Hernia: No hernia is present.  Musculoskeletal:     Cervical back: Normal range of motion and neck supple. No rigidity.     Right lower leg: No edema.     Left lower leg: No edema.  Lymphadenopathy:     Cervical: No cervical adenopathy.  Skin:    General: Skin is warm and dry.     Findings: No rash.  Neurological:     General: No focal deficit present.  Mental Status: She is alert. Mental status is at baseline.  Psychiatric:        Mood and Affect: Mood normal.        Behavior: Behavior normal.       Results for orders placed or performed in visit on 11/20/23  POCT Urinalysis Dipstick (Automated)   Collection Time: 11/20/23 10:59 AM  Result Value Ref Range   Color, UA yellow    Clarity, UA clear     Glucose, UA Negative Negative   Bilirubin, UA negative    Ketones, UA negative    Spec Grav, UA 1.025 1.010 - 1.025   Blood, UA negative    pH, UA 6.0 5.0 - 8.0   Protein, UA Positive (A) Negative   Urobilinogen, UA 0.2 0.2 or 1.0 E.U./dL   Nitrite, UA negative    Leukocytes, UA Negative Negative    Assessment & Plan:   Problem List Items Addressed This Visit     Advanced care planning/counseling discussion - Primary (Chronic)   Previously discussed.      Accelerated hypertension   Relevant Medications   amLODipine (NORVASC) 5 MG tablet   metoprolol succinate (TOPROL-XL) 50 MG 24 hr tablet   rosuvastatin (CRESTOR) 10 MG tablet   Dyslipidemia associated with type 2 diabetes mellitus (HCC)   Relevant Medications   rosuvastatin (CRESTOR) 10 MG tablet   GERD (gastroesophageal reflux disease)   Relevant Medications   CREON 36000-114000 units CPEP capsule   pantoprazole (PROTONIX) 40 MG tablet   CKD stage 3 due to type 2 diabetes mellitus (HCC)   Relevant Medications   rosuvastatin (CRESTOR) 10 MG tablet   Other Relevant Orders   Urinalysis, Routine w reflex microscopic   Other Visit Diagnoses       Other specified diseases of pancreas       Relevant Medications   CREON 36000-114000 units CPEP capsule     Persistent proteinuria       Relevant Orders   Protein, urine, 24 hour   POCT Urinalysis Dipstick (Automated) (Completed)     Other foreign body or object entering through skin, initial encounter       Relevant Orders   DG Humerus Left        Meds ordered this encounter  Medications   amLODipine (NORVASC) 5 MG tablet    Sig: Take 1 tablet (5 mg total) by mouth daily.    Dispense:  90 tablet    Refill:  4    Note new dose   CREON 36000-114000 units CPEP capsule    Sig: Take 2 capsules (72,000 Units total) by mouth 3 (three) times daily before meals.    Dispense:  600 capsule    Refill:  3   diphenhydrAMINE (BENADRYL) 25 MG tablet    Sig: Take 1 tablet (25  mg total) by mouth at bedtime.   metoprolol succinate (TOPROL-XL) 50 MG 24 hr tablet    Sig: TAKE 1 TABLET BY MOUTH DAILY  WITH OR IMMEDIATELY FOLLOWING A  MEAL    Dispense:  90 tablet    Refill:  4   pantoprazole (PROTONIX) 40 MG tablet    Sig: Take 1 tablet (40 mg total) by mouth every evening.    Dispense:  90 tablet    Refill:  4   rosuvastatin (CRESTOR) 10 MG tablet    Sig: Take 1 tablet (10 mg total) by mouth daily.    Dispense:  90 tablet    Refill:  4  Orders Placed This Encounter  Procedures   DG Humerus Left    Reason for Exam (SYMPTOM  OR DIAGNOSIS REQUIRED):   eval foreign body (CGM wire)    Preferred imaging location?:   [facility]   Protein, urine, 24 hour    Standing Status:   Future    Expiration Date:   11/19/2024   Urinalysis, Routine w reflex microscopic   POCT Urinalysis Dipstick (Automated)    Patient Instructions  Update xray of left upper arm  Urinalysis today  Pass by lab to pick up urine 24 hour collection test.  Good to see you today If interested, check with pharmacy about new 2 shot shingles series (shingrix).  Return as needed or in 3-4 months for follow up visit.   Follow up plan: Return in about 3 months (around 02/18/2024) for follow up visit.  Eustaquio Boyden, MD

## 2023-11-21 DIAGNOSIS — R801 Persistent proteinuria, unspecified: Secondary | ICD-10-CM | POA: Insufficient documentation

## 2023-11-21 DIAGNOSIS — W458XXA Other foreign body or object entering through skin, initial encounter: Secondary | ICD-10-CM | POA: Insufficient documentation

## 2023-11-21 NOTE — Assessment & Plan Note (Signed)
New proteinuria - will update 24 hour urine protein levels.

## 2023-11-21 NOTE — Assessment & Plan Note (Signed)
CBC without anemia, recent iron levels stable. Rec drop oral iron to every other day dosing

## 2023-11-21 NOTE — Assessment & Plan Note (Signed)
Chronic, stable on rosuvastatin 10mg  - continue The 10-year ASCVD risk score (Arnett DK, et al., 2019) is: 67.8%   Values used to calculate the score:     Age: 78 years     Sex: Female     Is Non-Hispanic African American: No     Diabetic: Yes     Tobacco smoker: No     Systolic Blood Pressure: 162 mmHg     Is BP treated: Yes     HDL Cholesterol: 44.4 mg/dL     Total Cholesterol: 158 mg/dL

## 2023-11-21 NOTE — Assessment & Plan Note (Addendum)
Newly proteinuric kidney disease.  Update 24hr urine protein as per above.  Known hypertension and diabetes. UA today - without active sediment

## 2023-11-21 NOTE — Assessment & Plan Note (Addendum)
Renal function stable in CKD stage 3b since hospitalization 2021, with latest GFR 39.  Newly worsening proteinuria, update UA today and send home with 24 hr urine collection for protein.  If worsening consider nephrology eval.  She is already on ACEI. Avoiding SGLT2i in h/o recurrent UTIs.  Consider updated renal imaging, SPEP.

## 2023-11-21 NOTE — Assessment & Plan Note (Addendum)
BP elevated in office despite amlodipine, benazepril/hctz and toprol XL.  Other recent office readings well controlled. Home readings also well controlled - will use home readings to titrate antihypertensives.

## 2023-11-21 NOTE — Assessment & Plan Note (Signed)
Sees urology, doing well on preventative measures of daily vaginal topical estrogen, mannitose, cranberry tablets

## 2023-11-21 NOTE — Assessment & Plan Note (Addendum)
Chronic, stable on pantoprazole 40mg  daily - continue indefinitely in prior h/o GIB. Monitor kidney function on daily PPI.

## 2023-11-21 NOTE — Assessment & Plan Note (Signed)
Continue crestor 

## 2023-11-21 NOTE — Assessment & Plan Note (Signed)
IVC filter in place (2021)

## 2023-11-21 NOTE — Assessment & Plan Note (Signed)
Continue creon through endo

## 2023-11-21 NOTE — Assessment & Plan Note (Addendum)
Thinks continuous glucose monitor sensor wire needle from 2 wks ago broke in left upper arm, remains asymptomatic.  Update xray to eval for foreign body.

## 2023-11-21 NOTE — Assessment & Plan Note (Signed)
Sees endo on daily Creon

## 2023-11-21 NOTE — Assessment & Plan Note (Addendum)
Chronic, recently with deteriorated control with A1c 7.5%. followed by endo on both basal and rapid acting insulin

## 2023-11-21 NOTE — Assessment & Plan Note (Signed)
Continue regular dietary cal. Vit D low- rec increase to 2000 international units  daily

## 2023-11-24 ENCOUNTER — Other Ambulatory Visit: Payer: Self-pay

## 2023-11-24 DIAGNOSIS — R801 Persistent proteinuria, unspecified: Secondary | ICD-10-CM | POA: Diagnosis not present

## 2023-11-25 ENCOUNTER — Ambulatory Visit: Payer: Medicare Other | Admitting: Dermatology

## 2023-11-25 DIAGNOSIS — L820 Inflamed seborrheic keratosis: Secondary | ICD-10-CM

## 2023-11-25 DIAGNOSIS — L814 Other melanin hyperpigmentation: Secondary | ICD-10-CM

## 2023-11-25 DIAGNOSIS — L821 Other seborrheic keratosis: Secondary | ICD-10-CM

## 2023-11-25 LAB — PROTEIN, URINE, 24 HOUR: Protein, 24H Urine: 1660 mg/(24.h) — ABNORMAL HIGH (ref 0–149)

## 2023-11-25 NOTE — Patient Instructions (Signed)

## 2023-11-25 NOTE — Progress Notes (Signed)
   New Patient Visit   Subjective  Katherine Oliver is a 78 y.o. female who presents for the following: check spot L upper thigh, 45yrs, started bleeding yesterday The patient has spots, moles and lesions to be evaluated, some may be new or changing and the patient may have concern these could be cancer.   The following portions of the chart were reviewed this encounter and updated as appropriate: medications, allergies, medical history  Review of Systems:  No other skin or systemic complaints except as noted in HPI or Assessment and Plan.  Objective  Well appearing patient in no apparent distress; mood and affect are within normal limits.   A focused examination was performed of the following areas: Right thigh  Relevant exam findings are noted in the Assessment and Plan.  R ant thigh x 1 Stuck on waxy pap with erythema and purpura  Assessment & Plan   INFLAMED SEBORRHEIC KERATOSIS R ant thigh x 1 ISK with purpura Symptomatic, irritating, patient would like treated. Destruction of lesion - R ant thigh x 1  Destruction method: cryotherapy   Informed consent: discussed and consent obtained   Lesion destroyed using liquid nitrogen: Yes   Region frozen until ice ball extended beyond lesion: Yes   Outcome: patient tolerated procedure well with no complications   Post-procedure details: wound care instructions given   Additional details:  Prior to procedure, discussed risks of blister formation, small wound, skin dyspigmentation, or rare scar following cryotherapy. Recommend Vaseline ointment to treated areas while healing.   SEBORRHEIC KERATOSIS - Stuck-on, waxy, tan-brown papules and/or plaques  - Benign-appearing - Discussed benign etiology and prognosis. - Observe - Call for any changes    LENTIGINES Exam: scattered tan macules Due to sun exposure Treatment Plan: Benign-appearing, observe. Recommend daily broad spectrum sunscreen SPF 30+ to sun-exposed  areas, reapply every 2 hours as needed.  Call for any changes   Return in about 3 months (around 02/23/2024) for TBSE.  I, Ardis Rowan, RMA, am acting as scribe for Willeen Niece, MD .   Documentation: I have reviewed the above documentation for accuracy and completeness, and I agree with the above.  Willeen Niece, MD

## 2023-11-27 ENCOUNTER — Encounter: Payer: Self-pay | Admitting: Family Medicine

## 2023-11-30 ENCOUNTER — Encounter: Payer: Self-pay | Admitting: Family Medicine

## 2023-11-30 ENCOUNTER — Other Ambulatory Visit: Payer: Self-pay | Admitting: Family Medicine

## 2023-11-30 DIAGNOSIS — N183 Chronic kidney disease, stage 3 unspecified: Secondary | ICD-10-CM

## 2023-11-30 DIAGNOSIS — R801 Persistent proteinuria, unspecified: Secondary | ICD-10-CM

## 2023-11-30 DIAGNOSIS — N1832 Chronic kidney disease, stage 3b: Secondary | ICD-10-CM

## 2023-12-02 ENCOUNTER — Other Ambulatory Visit: Payer: Self-pay | Admitting: Family Medicine

## 2023-12-02 DIAGNOSIS — I10 Essential (primary) hypertension: Secondary | ICD-10-CM

## 2023-12-04 DIAGNOSIS — M25561 Pain in right knee: Secondary | ICD-10-CM | POA: Diagnosis not present

## 2023-12-10 DIAGNOSIS — S76111D Strain of right quadriceps muscle, fascia and tendon, subsequent encounter: Secondary | ICD-10-CM | POA: Diagnosis not present

## 2023-12-10 DIAGNOSIS — R262 Difficulty in walking, not elsewhere classified: Secondary | ICD-10-CM | POA: Diagnosis not present

## 2023-12-18 ENCOUNTER — Ambulatory Visit (INDEPENDENT_AMBULATORY_CARE_PROVIDER_SITE_OTHER): Payer: Medicare Other | Admitting: Podiatry

## 2023-12-18 ENCOUNTER — Ambulatory Visit (INDEPENDENT_AMBULATORY_CARE_PROVIDER_SITE_OTHER): Payer: Medicare Other

## 2023-12-18 ENCOUNTER — Encounter: Payer: Self-pay | Admitting: Podiatry

## 2023-12-18 DIAGNOSIS — M722 Plantar fascial fibromatosis: Secondary | ICD-10-CM

## 2023-12-18 DIAGNOSIS — M7662 Achilles tendinitis, left leg: Secondary | ICD-10-CM | POA: Diagnosis not present

## 2023-12-18 NOTE — Progress Notes (Signed)
Chief Complaint  Patient presents with   Foot Pain    "My left heel has been hurting quite a bit." N - heel pain L - plantar heel left D - 3-4 weeks O - suddenly, about the same C - at times sharp but most of the time it just aches A - walking, certain shoes T - changed style of shoes, Acetaminophen 500 mg tid    HPI: 78 y.o. female presenting today as a reestablish new patient for evaluation of pain and tenderness associated to left heel.  Past Medical History:  Diagnosis Date   Allergic rhinitis    Allergy 1967   Anemia 2021   Arthritis 2010   Blood transfusion without reported diagnosis 2021   several in 2021   Breast mass, right 08/2014   biopsy benign - PASH   Cataract 2015   Clotting disorder (HCC) 2019   nosebleeds, cauterized 2020   Colon polyp 09/2008   tubulovillous adenoma, rpt 3-5 yrs   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 nephropathy (HCC)    DSME at [facility] 01/2016    COVID-19 virus infection 11/10/2022   DKA (diabetic ketoacidoses) 04/22/2020   Frequent epistaxis 05/16/2019   S/p cauterization with resolution 2020   GERD (gastroesophageal reflux disease)    Hepatic steatosis    by abd [place] 05/2012, mild transaminitis - normal iron sat and viral hep panel (2011), stable [place] 2017   History of chicken pox    History of measles    History of recurrent UTIs    on chronic keflex   HLD (hyperlipidemia)    HTN (hypertension)    Hypertensive retinopathy of both eyes, 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   Bulakowski   Kidney cyst, acquired 01/2016   L kidney by US   Kidney stone 01/2016   L kidney by [place]   Lung nodules 11/2013   overall stable on f/u CT 01/2016   Osteopenia 06/2013   mild, forearm T -1.1, hip and spine WNL   Pancreatitis    Polycythemia    mild, stable (2013)   Primary localized osteoarthritis of right knee 01/05/2019   Rosacea    metrogel   Ulcer 2021   ulcers treated and are gone    Past Surgical History:  Procedure Laterality Date    APPENDECTOMY  1987   BILIARY STENT PLACEMENT  12/14/2019   Procedure: BILIARY STENT PLACEMENT;  Surgeon: Jeani Hawking, MD;  Location: [facility] ENDOSCOPY;  Service: Endoscopy;;   BREAST BIOPSY Right 1963   benign   BREAST BIOPSY Right 08/2014   benign- core   cardiolite stress test  04/2004   normal   CESAREAN SECTION  1610;9604   x2   CHOLECYSTECTOMY  2003   COLONOSCOPY  09/26/2008   adenomatous polyp, rpt 3-5 yrs   COLONOSCOPY  08/2012   adenomatous polyps, diverticulosis, rec rpt 5 yrs Marva Panda)   COLONOSCOPY WITH PROPOFOL N/A 02/05/2018   4TA, SSA, diverticulosis, rpt 3 yrs Marva Panda, Cindra Eves, MD)   COLONOSCOPY WITH PROPOFOL N/A 10/14/2022   fair prep, TAx2 one was 1cm in size, diverticulosis, rpt 1 yr given fair prep Mia Creek, Rossie Muskrat, MD)   COSMETIC SURGERY  2022   lower  eyelids to prevent rubbing on eye surface   dexa  2003   normal   dexa  06/2013   [facility] - Tscore -1.1 forearm, normal spine and femur   ERCP N/A 12/14/2019   Procedure: ENDOSCOPIC RETROGRADE CHOLANGIOPANCREATOGRAPHY (ERCP);  Surgeon: Jeani Hawking, MD;  Location: [facility] ENDOSCOPY;  Service: Endoscopy;  Laterality: N/A;   ERCP  01/2020   nonbleeding gastric ulcer - [facility] hospitalization (Dr Shana Chute)   ESOPHAGOGASTRODUODENOSCOPY N/A 12/19/2019   Procedure: ESOPHAGOGASTRODUODENOSCOPY (EGD);  Surgeon: Charna Elizabeth, MD;  Location: [facility] ENDOSCOPY;  Service: Endoscopy;  Laterality: N/A;   ESOPHAGOGASTRODUODENOSCOPY  01/2020   pre existing AXIOS cystogastrostomy stent s/p necrosectomy - [facility] hospitalization (Dr Shana Chute)   ESOPHAGOGASTRODUODENOSCOPY N/A 04/24/2020   Procedure: ESOPHAGOGASTRODUODENOSCOPY (EGD);  Surgeon: Graylin Shiver, MD;  Location: [facility] ENDOSCOPY;  Service: Endoscopy;  Laterality: N/A;   ESOPHAGOGASTRODUODENOSCOPY  05/2020   normal esophagus, duodenum, erythematous mucosa in gastric body, gastric cystogastrostomy stent removed ([facility])   IR FLUORO GUIDE CV LINE RIGHT  12/27/2019   IR FLUORO GUIDE CV  LINE RIGHT  04/24/2020   IR IVC FILTER PLMT / S&I /IMG GUID/MOD SED  04/26/2020   IR IVC FILTER RETRIEVAL / S&I /IMG GUID/MOD SED  07/31/2021   IR RADIOLOGIST EVAL & MGMT  07/31/2020   IR RADIOLOGIST EVAL & MGMT  01/15/2021   IR RADIOLOGIST EVAL & MGMT  06/19/2021   IR REMOVAL TUN CV CATH W/O FL  01/03/2020   IR REMOVAL TUN CV CATH W/O FL  04/29/2020   IR REPLC DUODEN/JEJUNO TUBE PERCUT W/FLUORO  04/01/2020   IR US GUIDE VASC ACCESS RIGHT  04/24/2020   JOINT REPLACEMENT  March, 2020   right knee joint replacement   SPHINCTEROTOMY  12/14/2019   Procedure: SPHINCTEROTOMY;  Surgeon: Jeani Hawking, MD;  Location: [facility] ENDOSCOPY;  Service: Endoscopy;;   TOTAL KNEE ARTHROPLASTY Right 01/17/2019   Procedure: TOTAL KNEE ARTHROPLASTY;  Surgeon: Salvatore Marvel, MD;  Location: [facility] ORS;  Service: Orthopedics;  Laterality: Right;   TRANSTHORACIC ECHOCARDIOGRAM  04/2019   EF 55-60%, modLVH, impaired relaxation    TRIGGER FINGER RELEASE  2007;2010;2011   bilateral   TRIGGER FINGER RELEASE  02/2017   VAGINAL HYSTERECTOMY  1984   for menorrhagia, ovaries in place    Allergies  Allergen Reactions   Azithromycin Itching    Okay if takes benadryl along with it   Nickel     Reaction to cheap earrings cause redness   Sulfa Antibiotics Itching   Vinegar [Acetic Acid] Nausea Only   Adhesive [Tape] Rash    Paper tape only causes blisters   Keflex [Cephalexin] Rash     Physical Exam: General: The patient is alert and oriented x3 in no acute distress.  Dermatology: Skin is warm, dry and supple bilateral lower extremities. Negative for open lesions or macerations.  Vascular: Palpable pedal pulses bilaterally. No edema or erythema noted. Capillary refill within normal limits.  Neurological: Grossly intact via light touch  Musculoskeletal Exam: Pain on palpation noted to the posterior tubercle of the left calcaneus at the insertion of the Achilles tendon consistent with retrocalcaneal bursitis. Range  of motion within normal limits. Muscle strength 5/5 in all muscle groups bilateral lower extremities.  Radiographic Exam:  Posterior calcaneal spur noted to the respective calcaneus on lateral view. No fracture or dislocation noted. Normal osseous mineralization noted.     Assessment: 1. Insertional Achilles tendinitis LT 2. Posterior heel spur LT  Plan of Care:  -Patient was evaluated. Radiographs were reviewed today. -Injection of 0.5 mL Celestone Soluspan injected into the retrocalcaneal bursa. Care was taken to avoid direct injection into the Achilles tendon. -No NSAIDs -currently going to Physical Therapy at [facility] in [place]. She will ask physical therapy to work on her left lower extremity as well -  RTC PRN   Felecia Shelling, DPM [facility]  Dr. Felecia Shelling, DPM    [address] [place], [place] [address]                Office [phone]  Fax [phone]

## 2023-12-21 DIAGNOSIS — M7662 Achilles tendinitis, left leg: Secondary | ICD-10-CM | POA: Diagnosis not present

## 2023-12-21 MED ORDER — BETAMETHASONE SOD PHOS & ACET 6 (3-3) MG/ML IJ SUSP
3.0000 mg | Freq: Once | INTRAMUSCULAR | Status: AC
  Administered 2023-12-21: 3 mg via INTRA_ARTICULAR

## 2023-12-30 DIAGNOSIS — R262 Difficulty in walking, not elsewhere classified: Secondary | ICD-10-CM | POA: Diagnosis not present

## 2023-12-30 DIAGNOSIS — S76111D Strain of right quadriceps muscle, fascia and tendon, subsequent encounter: Secondary | ICD-10-CM | POA: Diagnosis not present

## 2023-12-31 ENCOUNTER — Other Ambulatory Visit (INDEPENDENT_AMBULATORY_CARE_PROVIDER_SITE_OTHER): Payer: Medicare Other

## 2023-12-31 DIAGNOSIS — Z794 Long term (current) use of insulin: Secondary | ICD-10-CM | POA: Diagnosis not present

## 2023-12-31 DIAGNOSIS — E1122 Type 2 diabetes mellitus with diabetic chronic kidney disease: Secondary | ICD-10-CM | POA: Diagnosis not present

## 2023-12-31 DIAGNOSIS — N1832 Chronic kidney disease, stage 3b: Secondary | ICD-10-CM | POA: Diagnosis not present

## 2023-12-31 DIAGNOSIS — R801 Persistent proteinuria, unspecified: Secondary | ICD-10-CM | POA: Diagnosis not present

## 2023-12-31 DIAGNOSIS — N183 Chronic kidney disease, stage 3 unspecified: Secondary | ICD-10-CM | POA: Diagnosis not present

## 2023-12-31 LAB — RENAL FUNCTION PANEL
Albumin: 4.3 g/dL (ref 3.5–5.2)
BUN: 41 mg/dL — ABNORMAL HIGH (ref 6–23)
CO2: 26 meq/L (ref 19–32)
Calcium: 9.2 mg/dL (ref 8.4–10.5)
Chloride: 102 meq/L (ref 96–112)
Creatinine, Ser: 1.37 mg/dL — ABNORMAL HIGH (ref 0.40–1.20)
GFR: 36.76 mL/min — ABNORMAL LOW (ref >60.00)
Glucose, Bld: 233 mg/dL — ABNORMAL HIGH (ref 70–99)
Phosphorus: 3.7 mg/dL (ref 2.3–4.6)
Potassium: 4.9 meq/L (ref 3.5–5.1)
Sodium: 137 meq/L (ref 135–145)

## 2024-01-02 ENCOUNTER — Ambulatory Visit
Admission: EM | Admit: 2024-01-02 | Discharge: 2024-01-02 | Disposition: A | Discharge status: Home / Self Care | Attending: Emergency Medicine | Admitting: Emergency Medicine

## 2024-01-02 ENCOUNTER — Telehealth: Payer: Self-pay | Admitting: Emergency Medicine

## 2024-01-02 ENCOUNTER — Ambulatory Visit (INDEPENDENT_AMBULATORY_CARE_PROVIDER_SITE_OTHER)

## 2024-01-02 DIAGNOSIS — M25511 Pain in right shoulder: Secondary | ICD-10-CM | POA: Diagnosis not present

## 2024-01-02 DIAGNOSIS — M19011 Primary osteoarthritis, right shoulder: Secondary | ICD-10-CM | POA: Diagnosis not present

## 2024-01-02 MED ORDER — BACLOFEN 5 MG PO TABS: 5.0000 mg | ORAL_TABLET | Freq: Every evening | ORAL | 0 refills | Status: AC | PRN

## 2024-01-02 MED ORDER — DICLOFENAC SODIUM 1 % EX GEL: 2.0000 g | Freq: Four times a day (QID) | CUTANEOUS | 1 refills | Status: DC

## 2024-01-02 MED ORDER — CELECOXIB 100 MG PO CAPS: 100.0000 mg | ORAL_CAPSULE | Freq: Every day | ORAL | 0 refills | Status: AC

## 2024-01-02 NOTE — ED Notes (Signed)
 Provider evaluation and discharge occurred prior to this RNs triage.  Patient awaiting xray prior to d/c.

## 2024-01-02 NOTE — ED Provider Notes (Signed)
 Renaldo Fiddler    CSN: [id number] Arrival date & time: 01/02/24  1220      History   Chief Complaint No chief complaint on file.   HPI Katherine Oliver is a 78 y.o. female.   Patient presents for evaluation of constant right shoulder pain beginning 7 days ago.  Radiates down the arm intermittently, denies numbness or tingling.  Unsure of injury, endorses 2 weeks ago she lost balance and fell, unsure if arm hit the wall.  Able to complete range of motion but painful.  Has applied heat and ice and use Tylenol which has been minimally effective.  History of a torn rotator cuff, and eligible for surgical repair.  History of diabetes.   Past Medical History:  Diagnosis Date   Allergic rhinitis    Allergy 1967   Anemia 2021   Arthritis 2010   Blood transfusion without reported diagnosis 2021   several in 2021   Breast mass, right 08/2014   biopsy benign - PASH   Cataract 2015   Clotting disorder (HCC) 2019   nosebleeds, cauterized 2020   Colon polyp 09/2008   tubulovillous adenoma, rpt 3-5 yrs   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 nephropathy (HCC)    DSME at [facility] 01/2016    COVID-19 virus infection 11/10/2022   DKA (diabetic ketoacidoses) 04/22/2020   Frequent epistaxis 05/16/2019   S/p cauterization with resolution 2020   GERD (gastroesophageal reflux disease)    Hepatic steatosis    by abd [place] 05/2012, mild transaminitis - normal iron sat and viral hep panel (2011), stable [place] 2017   History of chicken pox    History of measles    History of recurrent UTIs    on chronic keflex   HLD (hyperlipidemia)    HTN (hypertension)    Hypertensive retinopathy of both eyes, 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   Bulakowski   Kidney cyst, acquired 01/2016   L kidney by US   Kidney stone 01/2016   L kidney by [place]   Lung nodules 11/2013   overall stable on f/u CT 01/2016   Osteopenia 06/2013   mild, forearm T -1.1, hip and spine WNL   Pancreatitis    Polycythemia     mild, stable (2013)   Primary localized osteoarthritis of right knee 01/05/2019   Rosacea    metrogel   Ulcer 2021   ulcers treated and are gone    Patient Active Problem List   Diagnosis Date Noted   Persistent proteinuria 11/21/2023   Other foreign body or object entering through skin, initial encounter 11/21/2023   Type 2 diabetes mellitus with stage 3b chronic kidney disease, with long-term current use of insulin (HCC) 11/21/2022   Type 2 diabetes mellitus with hyperglycemia, with long-term current use of insulin (HCC) 11/21/2022   Pancreatic insufficiency 10/04/2021   Entropion of both lower eyelids 04/03/2021   Pain involving joint of finger of left hand 10/02/2020   Clostridioides difficile infection 06/05/2020   Hair loss 06/05/2020   Right femoral vein DVT (HCC) 05/03/2020   Hx of epistaxis    Aortic atherosclerosis (HCC)    Iron deficiency anemia 01/15/2020   Pancreatic pseudocyst 01/15/2020   Chronic pancreatitis (HCC) 01/12/2020   Acquired renal cyst of left kidney 01/06/2020   Gastric stress ulcer 01/06/2020   Necrotizing pancreatitis    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HCC)    History of pancreatitis 12/13/2019   Chronic diarrhea 06/01/2019   Primary localized osteoarthritis of right knee 01/05/2019  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   Hx of adenomatous polyp of colon 12/15/2017   Vulvar dermatitis 12/07/2017   Right hip pain 08/07/2016   CKD stage 3 due to type 2 diabetes mellitus (HCC) 04/04/2016   Pulmonary nodules 01/03/2016   Advanced care planning/counseling discussion 07/05/2015   Obesity, Class I, BMI 30-34.9 07/05/2015   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   Osteopenia 06/03/2013   Medicare annual wellness visit, subsequent 05/31/2012   Rotator cuff tendonitis, right 03/01/2012   Type 2 diabetes mellitus with other specified complication (HCC)    Accelerated hypertension    Dyslipidemia associated with type 2  diabetes mellitus (HCC)    History of recurrent UTIs    Allergic rhinitis    GERD (gastroesophageal reflux disease)    Rosacea    NAFLD (nonalcoholic fatty liver disease) 11/91/4782    Past Surgical History:  Procedure Laterality Date   APPENDECTOMY  1987   BILIARY STENT PLACEMENT  12/14/2019   Procedure: BILIARY STENT PLACEMENT;  Surgeon: Jeani Hawking, MD;  Location: [facility] ENDOSCOPY;  Service: Endoscopy;;   BREAST BIOPSY Right 1963   benign   BREAST BIOPSY Right 08/2014   benign- core   cardiolite stress test  04/2004   normal   CESAREAN SECTION  9562;1308   x2   CHOLECYSTECTOMY  2003   COLONOSCOPY  09/26/2008   adenomatous polyp, rpt 3-5 yrs   COLONOSCOPY  08/2012   adenomatous polyps, diverticulosis, rec rpt 5 yrs Marva Panda)   COLONOSCOPY WITH PROPOFOL N/A 02/05/2018   4TA, SSA, diverticulosis, rpt 3 yrs Marva Panda, Cindra Eves, MD)   COLONOSCOPY WITH PROPOFOL N/A 10/14/2022   fair prep, TAx2 one was 1cm in size, diverticulosis, rpt 1 yr given fair prep Mia Creek, Rossie Muskrat, MD)   COSMETIC SURGERY  2022   lower  eyelids to prevent rubbing on eye surface   dexa  2003   normal   dexa  06/2013   [facility] - Tscore -1.1 forearm, normal spine and femur   ERCP N/A 12/14/2019   Procedure: ENDOSCOPIC RETROGRADE CHOLANGIOPANCREATOGRAPHY (ERCP);  Surgeon: Jeani Hawking, MD;  Location: [facility] ENDOSCOPY;  Service: Endoscopy;  Laterality: N/A;   ERCP  01/2020   nonbleeding gastric ulcer - [facility] hospitalization (Dr Shana Chute)   ESOPHAGOGASTRODUODENOSCOPY N/A 12/19/2019   Procedure: ESOPHAGOGASTRODUODENOSCOPY (EGD);  Surgeon: Charna Elizabeth, MD;  Location: [facility] ENDOSCOPY;  Service: Endoscopy;  Laterality: N/A;   ESOPHAGOGASTRODUODENOSCOPY  01/2020   pre existing AXIOS cystogastrostomy stent s/p necrosectomy - [facility] hospitalization (Dr Shana Chute)   ESOPHAGOGASTRODUODENOSCOPY N/A 04/24/2020   Procedure: ESOPHAGOGASTRODUODENOSCOPY (EGD);  Surgeon: Graylin Shiver, MD;  Location: [facility] ENDOSCOPY;  Service:  Endoscopy;  Laterality: N/A;   ESOPHAGOGASTRODUODENOSCOPY  05/2020   normal esophagus, duodenum, erythematous mucosa in gastric body, gastric cystogastrostomy stent removed ([facility])   IR FLUORO GUIDE CV LINE RIGHT  12/27/2019   IR FLUORO GUIDE CV LINE RIGHT  04/24/2020   IR IVC FILTER PLMT / S&I /IMG GUID/MOD SED  04/26/2020   IR IVC FILTER RETRIEVAL / S&I /IMG GUID/MOD SED  07/31/2021   IR RADIOLOGIST EVAL & MGMT  07/31/2020   IR RADIOLOGIST EVAL & MGMT  01/15/2021   IR RADIOLOGIST EVAL & MGMT  06/19/2021   IR REMOVAL TUN CV CATH W/O FL  01/03/2020   IR REMOVAL TUN CV CATH W/O FL  04/29/2020   IR REPLC DUODEN/JEJUNO TUBE PERCUT W/FLUORO  04/01/2020   IR US GUIDE VASC ACCESS RIGHT  04/24/2020   JOINT REPLACEMENT  March, 2020  right knee joint replacement   SPHINCTEROTOMY  12/14/2019   Procedure: SPHINCTEROTOMY;  Surgeon: Jeani Hawking, MD;  Location: [facility] ENDOSCOPY;  Service: Endoscopy;;   TOTAL KNEE ARTHROPLASTY Right 01/17/2019   Procedure: TOTAL KNEE ARTHROPLASTY;  Surgeon: Salvatore Marvel, MD;  Location: [facility] ORS;  Service: Orthopedics;  Laterality: Right;   TRANSTHORACIC ECHOCARDIOGRAM  04/2019   EF 55-60%, modLVH, impaired relaxation    TRIGGER FINGER RELEASE  2007;2010;2011   bilateral   TRIGGER FINGER RELEASE  02/2017   VAGINAL HYSTERECTOMY  1984   for menorrhagia, ovaries in place    OB History   No obstetric history on file.      Home Medications    Prior to Admission medications   Medication Sig Start Date End Date Taking? Authorizing Provider  Baclofen 5 MG TABS Take 1 tablet (5 mg total) by mouth at bedtime as needed. 01/02/24  Yes Xitlaly Ault R, NP  celecoxib (CELEBREX) 100 MG capsule Take 1 capsule (100 mg total) by mouth daily for 15 days. 01/02/24 01/17/24 Yes Alfrieda Tarry R, NP  diclofenac Sodium (VOLTAREN) 1 % GEL Apply 2 g topically 4 (four) times daily. 01/02/24  Yes Aubryanna Nesheim, Elita Boone, NP  ACCU-CHEK AVIVA PLUS test strip 1 EACH BY OTHER ROUTE 3 (THREE) TIMES  DAILY. AND LANCETS 4/DAY 10/16/23   Shamleffer, Konrad Dolores, MD  acetaminophen (TYLENOL) 500 MG tablet Take 500 mg by mouth 2 (two) times daily.    [provider]  amLODipine (NORVASC) 5 MG tablet Take 1 tablet (5 mg total) by mouth daily. 11/20/23   Eustaquio Boyden, MD  B-D UF III MINI PEN NEEDLES 31G X 5 MM MISC INJECT 1 DEVICE INTO THE SKIN 4 (FOUR) TIMES DAILY. 07/01/23   Shamleffer, Konrad Dolores, MD  benazepril-hydrochlorthiazide (LOTENSIN HCT) 20-12.5 MG tablet TAKE 1 TABLET BY MOUTH DAILY 09/29/23   Eustaquio Boyden, MD  Cholecalciferol (VITAMIN D3) 25 MCG (1000 UT) CAPS Take 2 capsules (2,000 Units total) by mouth daily. 10/09/21   Eustaquio Boyden, MD  Continuous Blood Gluc Sensor (DEXCOM G6 SENSOR) MISC 1 Device by Other route See admin instructions. Change every 10 days 11/25/21   Romero Belling, MD  CRANBERRY PO Take 4,200 mg by mouth 2 (two) times daily.    [provider]  CREON 36000-114000 units CPEP capsule Take 2 capsules (72,000 Units total) by mouth 3 (three) times daily before meals. 11/20/23   Eustaquio Boyden, MD  D-MANNOSE PO Take 2 tablets by mouth daily with lunch.    [provider]  diphenhydrAMINE (BENADRYL) 25 MG tablet Take 1 tablet (25 mg total) by mouth at bedtime. 11/20/23   Eustaquio Boyden, MD  estradiol (ESTRACE) 0.1 MG/GM vaginal cream (DISCARD APPLICATOR) APPLY PEA SIZED AMOUNT TO TIP OF FINGER TO URETHRA BEFORE BED. WASH HANDS WELL AFTER APPLICATION. USE MONDAY, [facility] AND FRIDAY 01/08/23   Sondra Come, MD  ferrous sulfate 325 (65 FE) MG tablet Take 1 tablet (325 mg total) by mouth every other day. 11/20/22   Eustaquio Boyden, MD  fluticasone [facility]) 50 MCG/ACT nasal spray Place 1 spray into both nostrils daily. 11/17/22   Eustaquio Boyden, MD  folic acid (FOLVITE) 1 MG tablet TAKE 1 TABLET BY MOUTH DAILY 11/05/23   Eustaquio Boyden, MD  Glucagon (BAQSIMI ONE PACK) 3 MG/DOSE POWD Place 3 mg into the nose daily as needed  (hypoglycemia).    [provider]  insulin glargine (LANTUS) 100 UNIT/ML Solostar Pen Inject 26 Units into the skin at bedtime. 04/15/23  Shamleffer, Konrad Dolores, MD  insulin lispro (HUMALOG KWIKPEN) 200 UNIT/ML KwikPen Max daily 100 units 07/01/23   Shamleffer, Konrad Dolores, MD  loperamide (IMODIUM A-D) 2 MG tablet Take 4 mg by mouth 4 (four) times daily as needed for diarrhea or loose stools.    [provider]  meclizine (ANTIVERT) 25 MG tablet Take 1 tablet (25 mg total) by mouth 3 (three) times daily as needed for nausea. 10/08/23   Eustaquio Boyden, MD  metoprolol succinate (TOPROL-XL) 50 MG 24 hr tablet TAKE 1 TABLET BY MOUTH DAILY  WITH OR IMMEDIATELY FOLLOWING A  MEAL 11/20/23   Eustaquio Boyden, MD  Multiple Vitamin (MULTIVITAMIN WITH MINERALS) TABS tablet Take 1 tablet by mouth daily.    [provider]  pantoprazole (PROTONIX) 40 MG tablet Take 1 tablet (40 mg total) by mouth every evening. 11/20/23   Eustaquio Boyden, MD  rosuvastatin (CRESTOR) 10 MG tablet Take 1 tablet (10 mg total) by mouth daily. 11/20/23   Eustaquio Boyden, MD  triamcinolone (KENALOG) 0.1 % paste APPLY SPARINGLY 2-3 TIMES DAILY AS DIRECTED 09/24/23   Eustaquio Boyden, MD    Family History Family History  Problem Relation Age of Onset   Stroke Mother        several   Hyperlipidemia Mother    Hypertension Mother    Arthritis Mother    Vision loss Mother    Cancer Father        colon   Hypertension Father    Hyperlipidemia Father    Coronary artery disease Father 18       MIx1, CABG   Heart disease Father    Cancer Paternal Aunt        abdominal   Coronary artery disease Maternal Grandmother    Diabetes Maternal Grandfather    Coronary artery disease Maternal Grandfather    Heart disease Maternal Grandfather    Varicose Veins Paternal Grandmother    Anxiety disorder Son    Diabetes Maternal Uncle    Hypertension Son    Miscarriages / Stillbirths Sister    Breast  cancer Neg Hx     Social History Social History   Tobacco Use   Smoking status: Never    Passive exposure: Never   Smokeless tobacco: Never  Vaping Use   Vaping status: Never Used  Substance Use Topics   Alcohol use: Not Currently   Drug use: Never     Allergies   Azithromycin, Nickel, Sulfa antibiotics, Vinegar [acetic acid], Adhesive [tape], and Keflex [cephalexin]   Review of Systems Review of Systems   Physical Exam Triage Vital Signs ED Triage Vitals  Encounter Vitals Group     BP      Systolic BP Percentile      Diastolic BP Percentile      Pulse      Resp      Temp      Temp src      SpO2      Weight      Height      Head Circumference      Peak Flow      Pain Score      Pain Loc      Pain Education      Exclude from Growth Chart    No data found.  Updated Vital Signs There were no vitals taken for this visit.  Visual Acuity Right Eye Distance:   Left Eye Distance:   Bilateral Distance:    Right Eye Near:  Left Eye Near:    Bilateral Near:     Physical Exam Constitutional:      Appearance: Normal appearance.  Eyes:     Extraocular Movements: Extraocular movements intact.  Pulmonary:     Effort: Pulmonary effort is normal.  Musculoskeletal:     Comments: Tenderness present to the anterior superior and posterior right shoulder without point tenderness, no ecchymosis forming, pain elicited with abduction of the arm, 2+ brachial pulse,   Neurological:     Mental Status: She is alert and oriented to person, place, and time. Mental status is at baseline.      UC Treatments / Results  Labs (all labs ordered are listed, but only abnormal results are displayed) Labs Reviewed - No data to display  EKG   Radiology No results found.  Procedures Procedures (including critical care time)  Medications Ordered in UC Medications - No data to display  Initial Impression / Assessment and Plan / UC Course  I have reviewed the triage  vital signs and the nursing notes.  Pertinent labs & imaging results that were available during my care of the patient were reviewed by me and considered in my medical decision making (see chart for details).  Acute pain of right shoulder  Etiology most likely muscular however due to possible injury x-ray obtained, pending, will defer use of steroids due to diabetes, sensitive to the medication per patient, prescribed Celebrex, Voltaren gel and baclofen for home use recommended RICE, heat massage stretching with activity as tolerated, advised orthopedic follow-up if symptoms do not improve Final Clinical Impressions(s) / UC Diagnoses   Final diagnoses:  Acute pain of right shoulder     Discharge Instructions      X-rays pending, you will be notified of results via telephone  I do believe your symptoms today are irritation to the muscle in the joint  Take Celebrex once a day for at least 3 days consistently then may use as needed, if helpful but still having pain throughout the day may take twice daily  You may use Voltaren gel every 6 hours over the affected area helps to reduce inflammation and helps with pain  May use baclofen at bedtime for additional comfort and to allow you to get comfortable to rest  May continue Tylenol in addition to all medication  May continue ice and heat over the affected area in 10 to 15-minute intervals  Whenever sitting and lying place pillow underneath the arm and behind the back  May continue activity as tolerated  May massage and stretch the affected area if tolerable  If your symptoms continue to persist please follow-up with orthopedics or your primary doctor, information is listed on front page   ED Prescriptions     Medication Sig Dispense Auth. Provider   celecoxib (CELEBREX) 100 MG capsule Take 1 capsule (100 mg total) by mouth daily for 15 days. 15 capsule Jash Wahlen R, NP   Baclofen 5 MG TABS Take 1 tablet (5 mg total) by  mouth at bedtime as needed. 10 tablet Phil Corti, Hansel Starling R, NP   diclofenac Sodium (VOLTAREN) 1 % GEL Apply 2 g topically 4 (four) times daily. 50 g Valinda Hoar, NP      PDMP not reviewed this encounter.   Valinda Hoar, NP 01/02/24 1432

## 2024-01-02 NOTE — Discharge Instructions (Signed)
 X-rays pending, you will be notified of results via telephone  I do believe your symptoms today are irritation to the muscle in the joint  Take Celebrex once a day for at least 3 days consistently then may use as needed, if helpful but still having pain throughout the day may take twice daily  You may use Voltaren gel every 6 hours over the affected area helps to reduce inflammation and helps with pain  May use baclofen at bedtime for additional comfort and to allow you to get comfortable to rest  May continue Tylenol in addition to all medication  May continue ice and heat over the affected area in 10 to 15-minute intervals  Whenever sitting and lying place pillow underneath the arm and behind the back  May continue activity as tolerated  May massage and stretch the affected area if tolerable  If your symptoms continue to persist please follow-up with orthopedics or your primary doctor, information is listed on front page

## 2024-01-02 NOTE — Telephone Encounter (Signed)
 Reported x-ray results to patient via telephone, 2 patient identifiers used, no change in treatment plan

## 2024-01-04 DIAGNOSIS — S76111D Strain of right quadriceps muscle, fascia and tendon, subsequent encounter: Secondary | ICD-10-CM | POA: Diagnosis not present

## 2024-01-04 DIAGNOSIS — R262 Difficulty in walking, not elsewhere classified: Secondary | ICD-10-CM | POA: Diagnosis not present

## 2024-01-05 LAB — PROTEIN ELECTROPHORESIS, SERUM, WITH REFLEX
Albumin ELP: 4.1 g/dL (ref 3.8–4.8)
Alpha 1: 0.3 g/dL (ref 0.2–0.3)
Alpha 2: 0.8 g/dL (ref 0.5–0.9)
Beta 2: 0.3 g/dL (ref 0.2–0.5)
Beta Globulin: 0.4 g/dL (ref 0.4–0.6)
Gamma Globulin: 0.6 g/dL — ABNORMAL LOW (ref 0.8–1.7)
Total Protein: 6.5 g/dL (ref 6.1–8.1)

## 2024-01-05 LAB — IFE INTERPRETATION

## 2024-01-11 ENCOUNTER — Encounter: Payer: Self-pay | Admitting: Family Medicine

## 2024-01-11 DIAGNOSIS — D801 Nonfamilial hypogammaglobulinemia: Secondary | ICD-10-CM | POA: Insufficient documentation

## 2024-02-04 ENCOUNTER — Other Ambulatory Visit: Payer: Self-pay | Admitting: Family Medicine

## 2024-02-04 DIAGNOSIS — E1169 Type 2 diabetes mellitus with other specified complication: Secondary | ICD-10-CM

## 2024-02-04 DIAGNOSIS — D801 Nonfamilial hypogammaglobulinemia: Secondary | ICD-10-CM

## 2024-02-04 DIAGNOSIS — R801 Persistent proteinuria, unspecified: Secondary | ICD-10-CM

## 2024-02-04 DIAGNOSIS — E1122 Type 2 diabetes mellitus with diabetic chronic kidney disease: Secondary | ICD-10-CM

## 2024-02-05 ENCOUNTER — Other Ambulatory Visit (INDEPENDENT_AMBULATORY_CARE_PROVIDER_SITE_OTHER)

## 2024-02-05 DIAGNOSIS — D801 Nonfamilial hypogammaglobulinemia: Secondary | ICD-10-CM

## 2024-02-05 DIAGNOSIS — E1122 Type 2 diabetes mellitus with diabetic chronic kidney disease: Secondary | ICD-10-CM

## 2024-02-05 DIAGNOSIS — E1169 Type 2 diabetes mellitus with other specified complication: Secondary | ICD-10-CM | POA: Diagnosis not present

## 2024-02-05 DIAGNOSIS — N183 Chronic kidney disease, stage 3 unspecified: Secondary | ICD-10-CM | POA: Diagnosis not present

## 2024-02-05 DIAGNOSIS — R801 Persistent proteinuria, unspecified: Secondary | ICD-10-CM

## 2024-02-05 DIAGNOSIS — Z794 Long term (current) use of insulin: Secondary | ICD-10-CM

## 2024-02-05 LAB — RENAL FUNCTION PANEL
Albumin: 4.7 g/dL (ref 3.5–5.2)
BUN: 33 mg/dL — ABNORMAL HIGH (ref 6–23)
CO2: 27 meq/L (ref 19–32)
Calcium: 9.8 mg/dL (ref 8.4–10.5)
Chloride: 103 meq/L (ref 96–112)
Creatinine, Ser: 1.47 mg/dL — ABNORMAL HIGH (ref 0.40–1.20)
GFR: 33.75 mL/min — ABNORMAL LOW (ref >60.00)
Glucose, Bld: 191 mg/dL — ABNORMAL HIGH (ref 70–99)
Phosphorus: 3.4 mg/dL (ref 2.3–4.6)
Potassium: 4.6 meq/L (ref 3.5–5.1)
Sodium: 140 meq/L (ref 135–145)

## 2024-02-05 LAB — MAGNESIUM: Magnesium: 1.4 mg/dL — ABNORMAL LOW (ref 1.5–2.5)

## 2024-02-05 LAB — HEMOGLOBIN A1C: Hgb A1c MFr Bld: 7.8 % — ABNORMAL HIGH (ref 4.6–6.5)

## 2024-02-05 NOTE — Addendum Note (Signed)
 Addended by: Lovena Neighbours on: 02/05/2024 08:54 AM   Modules accepted: Orders

## 2024-02-08 ENCOUNTER — Other Ambulatory Visit: Payer: Self-pay | Admitting: Radiology

## 2024-02-08 DIAGNOSIS — N183 Chronic kidney disease, stage 3 unspecified: Secondary | ICD-10-CM | POA: Diagnosis not present

## 2024-02-08 DIAGNOSIS — Z794 Long term (current) use of insulin: Secondary | ICD-10-CM

## 2024-02-08 DIAGNOSIS — E1169 Type 2 diabetes mellitus with other specified complication: Secondary | ICD-10-CM

## 2024-02-08 DIAGNOSIS — E1122 Type 2 diabetes mellitus with diabetic chronic kidney disease: Secondary | ICD-10-CM | POA: Diagnosis not present

## 2024-02-08 DIAGNOSIS — D801 Nonfamilial hypogammaglobulinemia: Secondary | ICD-10-CM

## 2024-02-08 DIAGNOSIS — R801 Persistent proteinuria, unspecified: Secondary | ICD-10-CM

## 2024-02-08 LAB — KAPPA/LAMBDA LIGHT CHAINS
Kappa free light chain: 23.5 mg/L — ABNORMAL HIGH (ref 3.3–19.4)
Kappa:Lambda Ratio: 1.29 (ref 0.26–1.65)
Lambda Free Lght Chn: 18.2 mg/L (ref 5.7–26.3)

## 2024-02-13 LAB — UPEP/UIFE/LIGHT CHAINS/TP, 24-HR UR
% BETA, Urine: 6.5 %
ALBUMIN, U: 85.1 %
ALPHA 1 URINE: 1.9 %
ALPHA-2-GLOBULIN, U: 3.3 %
Free Kappa Lt Chains,Ur: 23.01 mg/L (ref 1.17–86.46)
Free Lambda Lt Chains,Ur: 4.68 mg/L (ref 0.27–15.21)
GAMMA GLOBULIN URINE: 3.1 %
Kappa/Lambda Ratio,U: 4.92 (ref 1.83–14.26)
Protein, 24H Urine: 987 mg/(24.h) — ABNORMAL HIGH (ref 30–150)
Protein, Ur: 70.5 mg/dL

## 2024-02-13 LAB — SPECIMEN STATUS REPORT

## 2024-02-16 ENCOUNTER — Encounter: Payer: Self-pay | Admitting: Family Medicine

## 2024-02-16 ENCOUNTER — Other Ambulatory Visit: Payer: Self-pay | Admitting: Family Medicine

## 2024-02-16 DIAGNOSIS — D801 Nonfamilial hypogammaglobulinemia: Secondary | ICD-10-CM

## 2024-02-16 MED ORDER — MAGNESIUM 400 MG PO TABS: 1.0000 | ORAL_TABLET | Freq: Every day | ORAL | Status: AC

## 2024-02-22 ENCOUNTER — Ambulatory Visit (INDEPENDENT_AMBULATORY_CARE_PROVIDER_SITE_OTHER): Payer: Medicare Other | Admitting: Family Medicine

## 2024-02-22 ENCOUNTER — Encounter: Payer: Self-pay | Admitting: Family Medicine

## 2024-02-22 VITALS — BP 124/76 | HR 67 | Temp 97.8°F | Ht 60.5 in | Wt 175.5 lb

## 2024-02-22 DIAGNOSIS — Z794 Long term (current) use of insulin: Secondary | ICD-10-CM | POA: Diagnosis not present

## 2024-02-22 DIAGNOSIS — E1165 Type 2 diabetes mellitus with hyperglycemia: Secondary | ICD-10-CM | POA: Diagnosis not present

## 2024-02-22 DIAGNOSIS — E1122 Type 2 diabetes mellitus with diabetic chronic kidney disease: Secondary | ICD-10-CM

## 2024-02-22 DIAGNOSIS — N281 Cyst of kidney, acquired: Secondary | ICD-10-CM

## 2024-02-22 DIAGNOSIS — K529 Noninfective gastroenteritis and colitis, unspecified: Secondary | ICD-10-CM | POA: Diagnosis not present

## 2024-02-22 DIAGNOSIS — K861 Other chronic pancreatitis: Secondary | ICD-10-CM

## 2024-02-22 DIAGNOSIS — I10 Essential (primary) hypertension: Secondary | ICD-10-CM

## 2024-02-22 DIAGNOSIS — N183 Chronic kidney disease, stage 3 unspecified: Secondary | ICD-10-CM | POA: Diagnosis not present

## 2024-02-22 MED ORDER — AMLODIPINE BESYLATE 10 MG PO TABS: 10.0000 mg | ORAL_TABLET | Freq: Every day | ORAL | 3 refills | Status: AC

## 2024-02-22 MED ORDER — DEXCOM G7 SENSOR MISC
0 refills | Status: AC
Start: 2024-02-22 — End: ?

## 2024-02-22 NOTE — Assessment & Plan Note (Signed)
 Continue creon  use through endo

## 2024-02-22 NOTE — Patient Instructions (Addendum)
 Continue higher dose amlodipine  10mg  nightly.  I have refilled DexCom G7 sensors for 3 months. I will order kidney ultrasound for further evaluation. Ensure good hydration status.  Return in 3 months for follow up visit

## 2024-02-22 NOTE — Assessment & Plan Note (Signed)
 Chronic, great control on current regimen including higher amlodipine  dose - continue this.

## 2024-02-22 NOTE — Assessment & Plan Note (Signed)
 Discussed worsening kidney function. Eval so far unrevealing/reassuring - SPEP/IFE, UPEP/IFE, serum and urine light chains.  Will proceed with renal US  for further evaluation.  Repeat kidney function at next visit, consider nephrology eval if progressive.

## 2024-02-22 NOTE — Assessment & Plan Note (Signed)
Update renal US.  

## 2024-02-22 NOTE — Progress Notes (Addendum)
 Ph: [phone] Fax: [phone]   Patient ID: Katherine Oliver, female    DOB: [birth year], 78 y.o.   MRN: [id number]  This visit was conducted in person.  BP 124/76   Pulse 67   Temp 97.8 F (36.6 C) (Oral)   Ht 5' 0.5" (1.537 m)   Wt 175 lb 8 oz (79.6 kg)   SpO2 95%   BMI 33.71 kg/m   BP Readings from Last 3 Encounters:  02/22/24 124/76  11/20/23 (!) 162/80  10/16/23 136/84    CC: 3 mo f/u visit  Subjective:   HPI: [name] is a 78 y.o. female presenting on 02/22/2024 for Medical Management of Chronic Issues (Here for 3 mo f/u. Pt accompanied by husband, Bambi Lever. )   See prior note for details.  CKD stage 3, latest GFR 33 (02/2024). Noted worsening proteinuria, avoiding SGLT2i in h/o recurrent UTIs. Worsening glycemic control noted.   UPEP with protein 1660 mg/day (11/2023), dropped to 987 mg/day(02/2024). No M spike, normal free kappa and lambda light chains in urine. Mildly elevated kappa light chains in serum (23.5) with normal ratio. SPEP/IFE WNL 12/2023 except for mildly decreased gamma globulin (0.6). UA without active sediment 11/2023.   She regularly takes pantoprazole  40mg  nightly.   Diabetes - followed by endocrinology Dr Rosalea Collin, on humalog  20u TID A with humalog  10-16u for bedtime snacks, as well as lantus  26u daily. 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 Cbg this morning 250 - with frequent am hyperglycemia and diurnal fluctuations.  Receives CGM DexCom G7 from [employer] - checks sugars 4+ times a day to correlate with insulin  administration. Needs this refilled.  Lab Results  Component Value Date   HGBA1C 7.8 (H) 02/05/2024   HTN well controlled on current regimen of amlodipine  10mg  daily, benazepril  / hydrochlorothiazide  20/12.5mg  daily, Toprol  XL 50mg  daily. She actually increased amlodipine  to 10mg  with improved BP control.   She stopped bneadryl.  She continues awakening q2 hours due to nocturia.  Daytime frequency without urgency or dysuria.  2 wks ago developed yeast infection treated at home with OTC monistat with benefit.   Acute illness this past Friday - with nausea and dry heaves followed by diarrhea. Lasted overnight.then she felt better. May have started after drinking atkins protein shake.      Relevant past medical, surgical, family and social history reviewed and updated as indicated. Interim medical history since our last visit reviewed. Allergies and medications reviewed and updated. Outpatient Medications Prior to Visit  Medication Sig Dispense Refill   ACCU-CHEK AVIVA PLUS test strip 1 EACH BY OTHER ROUTE 3 (THREE) TIMES DAILY. AND LANCETS 4/DAY 300 strip 3   acetaminophen  (TYLENOL ) 500 MG tablet Take 500 mg by mouth 2 (two) times daily.     B-D UF III MINI PEN NEEDLES 31G X 5 MM MISC INJECT 1 DEVICE INTO THE SKIN 4 (FOUR) TIMES DAILY. 400 each 3   Baclofen  5 MG TABS Take 1 tablet (5 mg total) by mouth at bedtime as needed. 10 tablet 0   benazepril -hydrochlorthiazide (LOTENSIN  HCT) 20-12.5 MG tablet TAKE 1 TABLET BY MOUTH DAILY 90 tablet 3   Cholecalciferol (VITAMIN D3) 25 MCG (1000 UT) CAPS Take 2 capsules (2,000 Units total) by mouth daily.     CRANBERRY PO Take 4,200 mg by mouth 2 (two) times daily.     CREON  36000-114000 units CPEP capsule Take 2 capsules (72,000 Units total) by mouth 3 (three) times daily before meals. 600 capsule 3  D-MANNOSE PO Take 2 tablets by mouth daily with lunch.     diclofenac  Sodium (VOLTAREN ) 1 % GEL Apply 2 g topically 4 (four) times daily. 50 g 1   estradiol  (ESTRACE ) 0.1 MG/GM vaginal cream (DISCARD APPLICATOR) APPLY PEA SIZED AMOUNT TO TIP OF FINGER TO URETHRA BEFORE BED. WASH HANDS WELL AFTER APPLICATION. USE MONDAY, WEDNESDAY AND FRIDAY 42.5 g 11   ferrous sulfate  325 (65 FE) MG tablet Take 1 tablet (325 mg total) by mouth every other day.     fluticasone  (FLONASE ) 50 MCG/ACT nasal spray Place 1 spray into both nostrils daily. 16 g  11   folic acid  (FOLVITE ) 1 MG tablet TAKE 1 TABLET BY MOUTH DAILY 90 tablet 3   Glucagon  (BAQSIMI  ONE PACK) 3 MG/DOSE POWD Place 3 mg into the nose daily as needed (hypoglycemia).     insulin  glargine (LANTUS ) 100 UNIT/ML Solostar Pen Inject 26 Units into the skin at bedtime. 30 mL 3   insulin  lispro (HUMALOG  KWIKPEN) 200 UNIT/ML KwikPen Max daily 100 units 90 mL 1   loperamide  (IMODIUM  A-D) 2 MG tablet Take 4 mg by mouth 4 (four) times daily as needed for diarrhea or loose stools.     Magnesium  400 MG TABS Take 1 tablet by mouth daily.     meclizine  (ANTIVERT ) 25 MG tablet Take 1 tablet (25 mg total) by mouth 3 (three) times daily as needed for nausea. 30 tablet 0   metoprolol  succinate (TOPROL -XL) 50 MG 24 hr tablet TAKE 1 TABLET BY MOUTH DAILY  WITH OR IMMEDIATELY FOLLOWING A  MEAL 90 tablet 4   Multiple Vitamin (MULTIVITAMIN WITH MINERALS) TABS tablet Take 1 tablet by mouth daily.     pantoprazole  (PROTONIX ) 40 MG tablet Take 1 tablet (40 mg total) by mouth every evening. 90 tablet 4   rosuvastatin  (CRESTOR ) 10 MG tablet Take 1 tablet (10 mg total) by mouth daily. 90 tablet 4   triamcinolone  (KENALOG ) 0.1 % paste APPLY SPARINGLY 2-3 TIMES DAILY AS DIRECTED (Patient taking differently: APPLY SPARINGLY 2-3 TIMES DAILY AS DIRECTED AS NEEDED) 5 g 1   amLODipine  (NORVASC ) 5 MG tablet Take 1 tablet (5 mg total) by mouth daily. 90 tablet 4   Continuous Blood Gluc Sensor (DEXCOM G6 SENSOR) MISC 1 Device by Other route See admin instructions. Change every 10 days 9 each 3   diphenhydrAMINE  (BENADRYL ) 25 MG tablet Take 1 tablet (25 mg total) by mouth at bedtime.     No facility-administered medications prior to visit.     Per HPI unless specifically indicated in ROS section below Review of Systems  Objective:  BP 124/76   Pulse 67   Temp 97.8 F (36.6 C) (Oral)   Ht 5' 0.5" (1.537 m)   Wt 175 lb 8 oz (79.6 kg)   SpO2 95%   BMI 33.71 kg/m   Wt Readings from Last 3 Encounters:  02/22/24  175 lb 8 oz (79.6 kg)  11/20/23 178 lb 4 oz (80.9 kg)  10/16/23 177 lb (80.3 kg)      Physical Exam Vitals and nursing note reviewed.  Constitutional:      Appearance: Normal appearance. She is not ill-appearing.  HENT:     Mouth/Throat:     Mouth: Mucous membranes are moist.     Pharynx: Oropharynx is clear. No oropharyngeal exudate or posterior oropharyngeal erythema.  Eyes:     Extraocular Movements: Extraocular movements intact.     Pupils: Pupils are equal, round, and reactive to light.  Cardiovascular:     Rate and Rhythm: Normal rate and regular rhythm.     Pulses: Normal pulses.     Heart sounds: Normal heart sounds. No murmur heard. Pulmonary:     Effort: Pulmonary effort is normal. No respiratory distress.     Breath sounds: Normal breath sounds. No wheezing, rhonchi or rales.  Abdominal:     Palpations: Abdomen is soft. There is no mass.     Tenderness: There is no abdominal tenderness. There is no right CVA tenderness, left CVA tenderness, guarding or rebound.  Musculoskeletal:     Right lower leg: No edema.     Left lower leg: No edema.  Skin:    General: Skin is warm and dry.     Findings: No rash.  Neurological:     Mental Status: She is alert.  Psychiatric:        Mood and Affect: Mood normal.        Behavior: Behavior normal.       Results for orders placed or performed in visit on 02/08/24  UPEP/UIFE/Light Chains/TP, 24-Hr Ur   Collection Time: 02/08/24 12:00 AM  Result Value Ref Range   Protein, Ur 70.5 Not Estab. mg/dL   Protein, 16X Urine 096 (H) 30 - 150 mg/24 hr   ALBUMIN , U 85.1 %   ALPHA 1 URINE 1.9 %   ALPHA-2-GLOBULIN, U 3.3 %   % BETA, Urine 6.5 %   GAMMA GLOBULIN URINE 3.1 %   M-SPIKE, % Not Observed Not Observed %   Immunofixation, Urine Comment    NOTE: Comment    Free Kappa Lt Chains,Ur 23.01 1.17 - 86.46 mg/L   Free Lambda Lt Chains,Ur 4.68 0.27 - 15.21 mg/L   Kappa/Lambda Ratio,U 4.92 1.83 - 14.26  Specimen status report    Collection Time: 02/08/24 12:00 AM  Result Value Ref Range   specimen status report Comment     Assessment & Plan:   Problem List Items Addressed This Visit     Accelerated hypertension   Chronic, great control on current regimen including higher amlodipine  dose - continue this.       Relevant Medications   amLODipine  (NORVASC ) 10 MG tablet   CKD stage 3 due to type 2 diabetes mellitus (HCC) - Primary   Discussed worsening kidney function. Eval so far unrevealing/reassuring - SPEP/IFE, UPEP/IFE, serum and urine light chains.  Will proceed with renal US  for further evaluation.  Repeat kidney function at next visit, consider nephrology eval if progressive.       Relevant Orders   US  Renal   Chronic diarrhea   Continue creon  use through endo      Acquired renal cyst of left kidney   Update renal US .       Relevant Orders   US  Renal   Chronic pancreatitis (HCC)   Continue creon  use through endo      Type 2 diabetes mellitus with hyperglycemia, with long-term current use of insulin  (HCC)   Followed by endo, notes ongoing impaired fasting hyperglycemia despite Lantus  26u nightly.  Keep endo f/u 04/2024.  DexCom G7 sensors sent to pharmacy 3 month supply per pt request.  Future refills may come from endocrinology.         Meds ordered this encounter  Medications   amLODipine  (NORVASC ) 10 MG tablet    Sig: Take 1 tablet (10 mg total) by mouth daily.    Dispense:  90 tablet    Refill:  3  Note new dose   Continuous Glucose Sensor (DEXCOM G7 SENSOR) MISC    Sig: Use on skin q10 days to check sugars E11.65, Z79.4    Dispense:  9 each    Refill:  0    Orders Placed This Encounter  Procedures   US  Renal    Standing Status:   Future    Expiration Date:   02/21/2025    Reason for Exam (SYMPTOM  OR DIAGNOSIS REQUIRED):   progressively worseing CKD    Preferred imaging location?:   [facility]    Patient Instructions  Continue higher dose amlodipine  10mg   nightly.  I have refilled DexCom G7 sensors for 3 months. I will order kidney ultrasound for further evaluation. Ensure good hydration status.  Return in 3 months for follow up visit   Follow up plan: Return in about 3 months (around 05/23/2024) for follow up visit.  Claire Crick, MD

## 2024-02-22 NOTE — Assessment & Plan Note (Addendum)
 Followed by endo, notes ongoing impaired fasting hyperglycemia despite Lantus  26u nightly.  Keep endo f/u 04/2024.  DexCom G7 sensors sent to pharmacy 3 month supply per pt request.  Future refills may come from endocrinology.

## 2024-02-26 ENCOUNTER — Encounter: Payer: Self-pay | Admitting: Family Medicine

## 2024-02-29 ENCOUNTER — Ambulatory Visit: Payer: Medicare Other | Admitting: Dermatology

## 2024-02-29 ENCOUNTER — Encounter: Payer: Self-pay | Admitting: Dermatology

## 2024-02-29 DIAGNOSIS — W908XXA Exposure to other nonionizing radiation, initial encounter: Secondary | ICD-10-CM | POA: Diagnosis not present

## 2024-02-29 DIAGNOSIS — Z1283 Encounter for screening for malignant neoplasm of skin: Secondary | ICD-10-CM | POA: Diagnosis not present

## 2024-02-29 DIAGNOSIS — L817 Pigmented purpuric dermatosis: Secondary | ICD-10-CM | POA: Diagnosis not present

## 2024-02-29 DIAGNOSIS — Q828 Other specified congenital malformations of skin: Secondary | ICD-10-CM | POA: Diagnosis not present

## 2024-02-29 DIAGNOSIS — D229 Melanocytic nevi, unspecified: Secondary | ICD-10-CM

## 2024-02-29 DIAGNOSIS — L821 Other seborrheic keratosis: Secondary | ICD-10-CM

## 2024-02-29 DIAGNOSIS — L720 Epidermal cyst: Secondary | ICD-10-CM

## 2024-02-29 DIAGNOSIS — L578 Other skin changes due to chronic exposure to nonionizing radiation: Secondary | ICD-10-CM

## 2024-02-29 DIAGNOSIS — L814 Other melanin hyperpigmentation: Secondary | ICD-10-CM | POA: Diagnosis not present

## 2024-02-29 DIAGNOSIS — L719 Rosacea, unspecified: Secondary | ICD-10-CM | POA: Diagnosis not present

## 2024-02-29 DIAGNOSIS — L820 Inflamed seborrheic keratosis: Secondary | ICD-10-CM | POA: Diagnosis not present

## 2024-02-29 DIAGNOSIS — D492 Neoplasm of unspecified behavior of bone, soft tissue, and skin: Secondary | ICD-10-CM | POA: Diagnosis not present

## 2024-02-29 DIAGNOSIS — L853 Xerosis cutis: Secondary | ICD-10-CM

## 2024-02-29 DIAGNOSIS — D1801 Hemangioma of skin and subcutaneous tissue: Secondary | ICD-10-CM | POA: Diagnosis not present

## 2024-02-29 DIAGNOSIS — Q825 Congenital non-neoplastic nevus: Secondary | ICD-10-CM

## 2024-02-29 DIAGNOSIS — L729 Follicular cyst of the skin and subcutaneous tissue, unspecified: Secondary | ICD-10-CM

## 2024-02-29 MED ORDER — METRONIDAZOLE 0.75 % EX CREA: TOPICAL_CREAM | CUTANEOUS | 2 refills | Status: AC

## 2024-02-29 NOTE — Patient Instructions (Addendum)
 Start Metronidazole  0.75% cream twice a day as needed for rosacea.   Cryotherapy Aftercare  Wash gently with soap and water  everyday.   Apply Vaseline and Band-Aid daily until healed.     Wound Care Instructions  Cleanse wound gently with soap and water  once a day then pat dry with clean gauze. Apply a thin coat of Petrolatum (petroleum jelly, "Vaseline") over the wound (unless you have an allergy to this). We recommend that you use a new, sterile tube of Vaseline. Do not pick or remove scabs. Do not remove the yellow or white "healing tissue" from the base of the wound.  Cover the wound with fresh, clean, nonstick gauze and secure with paper tape. You may use Band-Aids in place of gauze and tape if the wound is small enough, but would recommend trimming much of the tape off as there is often too much. Sometimes Band-Aids can irritate the skin.  You should call the office for your biopsy report after 1 week if you have not already been contacted.  If you experience any problems, such as abnormal amounts of bleeding, swelling, significant bruising, significant pain, or evidence of infection, please call the office immediately.  FOR ADULT SURGERY PATIENTS: If you need something for pain relief you may take 1 extra strength Tylenol  (acetaminophen ) AND 2 Ibuprofen (200mg  each) together every 4 hours as needed for pain. (do not take these if you are allergic to them or if you have a reason you should not take them.) Typically, you may only need pain medication for 1 to 3 days.     Gentle Skin Care Guide  1. Bathe no more than once a day.  2. Avoid bathing in hot water   3. Use a mild soap like Dove, Vanicream, Cetaphil, CeraVe. Can use Lever 2000 or Cetaphil antibacterial soap  4. Use soap only where you need it. On most days, use it under your arms, between your legs, and on your feet. Let the water  rinse other areas unless visibly dirty.  5. When you get out of the bath/shower, use a  towel to gently blot your skin dry, don't rub it.  6. While your skin is still a little damp, apply a moisturizing cream such as Vanicream, CeraVe, Cetaphil, Eucerin, Sarna lotion or plain Vaseline Jelly. For hands apply Neutrogena [place] Hand Cream or Excipial Hand Cream.  7. Reapply moisturizer any time you start to itch or feel dry.  8. Sometimes using free and clear laundry detergents can be helpful. Fabric softener sheets should be avoided. Downy Free & Gentle liquid, or any liquid fabric softener that is free of dyes and perfumes, it acceptable to use  9. If your doctor has given you prescription creams you may apply moisturizers over them        Recommend daily broad spectrum sunscreen SPF 30+ to sun-exposed areas, reapply every 2 hours as needed. Call for new or changing lesions.  Staying in the shade or wearing long sleeves, sun glasses (UVA+UVB protection) and wide brim hats (4-inch brim around the entire circumference of the hat) are also recommended for sun protection.      Seborrheic Keratosis  What causes seborrheic keratoses? Seborrheic keratoses are harmless, common skin growths that first appear during adult life.  As time goes by, more growths appear.  Some people may develop a large number of them.  Seborrheic keratoses appear on both covered and uncovered body parts.  They are not caused by sunlight.  The tendency to develop  seborrheic keratoses can be inherited.  They vary in color from skin-colored to gray, brown, or even black.  They can be either smooth or have a rough, warty surface.   Seborrheic keratoses are superficial and look as if they were stuck on the skin.  Under the microscope this type of keratosis looks like layers upon layers of skin.  That is why at times the top layer may seem to fall off, but the rest of the growth remains and re-grows.    Treatment Seborrheic keratoses do not need to be treated, but can easily be removed in the office.   Seborrheic keratoses often cause symptoms when they rub on clothing or jewelry.  Lesions can be in the way of shaving.  If they become inflamed, they can cause itching, soreness, or burning.  Removal of a seborrheic keratosis can be accomplished by freezing, burning, or surgery. If any spot bleeds, scabs, or grows rapidly, please return to have it checked, as these can be an indication of a skin cancer.       Melanoma ABCDEs  Melanoma is the most dangerous type of skin cancer, and is the leading cause of death from skin disease.  You are more likely to develop melanoma if you: Have light-colored skin, light-colored eyes, or red or blond hair Spend a lot of time in the sun Tan regularly, either outdoors or in a tanning bed Have had blistering sunburns, especially during childhood Have a close family member who has had a melanoma Have atypical moles or large birthmarks  Early detection of melanoma is key since treatment is typically straightforward and cure rates are extremely high if we catch it early.   The first sign of melanoma is often a change in a mole or a new dark spot.  The ABCDE system is a way of remembering the signs of melanoma.  A for asymmetry:  The two halves do not match. B for border:  The edges of the growth are irregular. C for color:  A mixture of colors are present instead of an even brown color. D for diameter:  Melanomas are usually (but not always) greater than 6mm - the size of a pencil eraser. E for evolution:  The spot keeps changing in size, shape, and color.  Please check your skin once per month between visits. You can use a small mirror in front and a large mirror behind you to keep an eye on the back side or your body.   If you see any new or changing lesions before your next follow-up, please call to schedule a visit.  Please continue daily skin protection including broad spectrum sunscreen SPF 30+ to sun-exposed areas, reapplying every 2 hours as  needed when you're outdoors.   Staying in the shade or wearing long sleeves, sun glasses (UVA+UVB protection) and wide brim hats (4-inch brim around the entire circumference of the hat) are also recommended for sun protection.        Due to recent changes in healthcare laws, you may see results of your pathology and/or laboratory studies on MyChart before the doctors have had a chance to review them. We understand that in some cases there may be results that are confusing or concerning to you. Please understand that not all results are received at the same time and often the doctors may need to interpret multiple results in order to provide you with the best plan of care or course of treatment. Therefore, we ask that you please  give us  2 business days to thoroughly review all your results before contacting the office for clarification. Should we see a critical lab result, you will be contacted sooner.   If You Need Anything After Your Visit  If you have any questions or concerns for your doctor, please call our main line at [phone] and press option 4 to reach your doctor's medical assistant. If no one answers, please leave a voicemail as directed and we will return your call as soon as possible. Messages left after 4 pm will be answered the following business day.   You may also send us  a message via MyChart. We typically respond to MyChart messages within 1-2 business days.  For prescription refills, please ask your pharmacy to contact our office. Our fax number is [phone].  If you have an urgent issue when the clinic is closed that cannot wait until the next business day, you can page your doctor at the number below.    Please note that while we do our best to be available for urgent issues outside of office hours, we are not available 24/7.   If you have an urgent issue and are unable to reach us , you may choose to seek medical care at your doctor's office, retail clinic, urgent  care center, or emergency room.  If you have a medical emergency, please immediately call 911 or go to the emergency department.  Pager Numbers  - Dr. Bary Likes: [phone]  - Dr. Annette Barters: [phone]  - Dr. Felipe Horton: [phone]   In the event of inclement weather, please call our main line at [phone] for an update on the status of any delays or closures.  Dermatology Medication Tips: Please keep the boxes that topical medications come in in order to help keep track of the instructions about where and how to use these. Pharmacies typically print the medication instructions only on the boxes and not directly on the medication tubes.   If your medication is too expensive, please contact our office at [phone] option 4 or send us  a message through MyChart.   We are unable to tell what your co-pay for medications will be in advance as this is different depending on your insurance coverage. However, we may be able to find a substitute medication at lower cost or fill out paperwork to get insurance to cover a needed medication.   If a prior authorization is required to get your medication covered by your insurance company, please allow us  1-2 business days to complete this process.  Drug prices often vary depending on where the prescription is filled and some pharmacies may offer cheaper prices.  The website www.goodrx.com contains coupons for medications through different pharmacies. The prices here do not account for what the cost may be with help from insurance (it may be cheaper with your insurance), but the website can give you the price if you did not use any insurance.  - You can print the associated coupon and take it with your prescription to the pharmacy.  - You may also stop by our office during regular business hours and pick up a GoodRx coupon card.  - If you need your prescription sent electronically to a different pharmacy, notify our office through [facility]  or by phone at [phone] option 4.     Si Usted Necesita Algo Despus de Su Visita  Tambin puede enviarnos un mensaje a travs de Clinical cytogeneticist. Por lo general respondemos a los mensajes de MyChart en el transcurso de  1 a 2 das hbiles.  Para renovar recetas, por favor pida a su farmacia que se ponga en contacto con nuestra oficina. Franz Jacks de fax es [place] [phone].  Si tiene un asunto urgente cuando la clnica est cerrada y que no puede esperar hasta el siguiente da hbil, puede llamar/localizar a su doctor(a) al nmero que aparece a continuacin.   Por favor, tenga en cuenta que aunque hacemos todo lo posible para estar disponibles para asuntos urgentes fuera del horario de [place], no estamos disponibles las 24 horas del da, los 7 [address]  [address] de la [place].   Si tiene un problema urgente y no puede comunicarse con nosotros, puede optar por buscar atencin mdica  en el consultorio de su doctor(a), en una clnica privada, en un centro de atencin urgente o en una sala de emergencias.  Si tiene Engineer, drilling, por favor llame inmediatamente al 911 o vaya a la sala de emergencias.  Nmeros de bper  - Dr. Bary Likes: [phone]  - Dra. Annette Barters: [phone]  - Dr. Felipe Horton: [phone]   En caso de inclemencias del tiempo, por favor llame a Lajuan Pila principal al [phone] para una actualizacin sobre el [place] de cualquier retraso o cierre.  Consejos para la medicacin en dermatologa: Por favor, guarde las cajas en las que vienen los medicamentos de uso tpico para ayudarle a seguir las instrucciones sobre dnde y cmo usarlos. Las farmacias generalmente imprimen las instrucciones del medicamento slo en las cajas y no directamente en los tubos del [place].   Si su medicamento es muy caro, por favor, pngase en contacto con Bettyjane Brunet llamando al [phone] y presione la opcin 4 o envenos un mensaje a travs de Clinical cytogeneticist.   No podemos decirle cul ser su  copago por los medicamentos por adelantado ya que esto es diferente dependiendo de la cobertura de su seguro. Sin embargo, es posible que podamos encontrar un medicamento sustituto a Audiological scientist un formulario para que el seguro cubra el medicamento que se considera necesario.   Si se requiere una autorizacin previa para que su compaa de seguros [place] su medicamento, por favor permtanos de 1 a 2 das hbiles para completar este proceso.  Los precios de los medicamentos varan con frecuencia dependiendo del Environmental consultant de dnde se surte la receta y alguna farmacias pueden ofrecer precios ms baratos.  El sitio web www.goodrx.com tiene cupones para medicamentos de Health and safety inspector. Los precios aqu no tienen en cuenta lo que podra costar con la ayuda del seguro (puede ser ms barato con su seguro), pero el sitio web puede darle el precio si no utiliz Tourist information centre manager.  - Puede imprimir el cupn correspondiente y llevarlo con su receta a la farmacia.  - Tambin puede pasar por nuestra oficina durante el horario de atencin regular y Education officer, museum una tarjeta de cupones de GoodRx.  - Si necesita que su receta se enve electrnicamente a una farmacia diferente, informe a nuestra oficina a travs de MyChart de [place] o por telfono llamando al [phone] y presione la opcin 4.

## 2024-02-29 NOTE — Progress Notes (Signed)
 Follow-Up Visit   Subjective  Katherine Oliver is a 78 y.o. female who presents for the following: Skin Cancer Screening and Full Body Skin Exam. No personal or family Hx of skin cancer or dysplastic nevi.   Lesion on left side. Growing. Getting rubbed and irritated by clothing.   The patient presents for Total-Body Skin Exam (TBSE) for skin cancer screening and mole check. The patient has spots, moles and lesions to be evaluated, some may be new or changing and the patient may have concern these could be cancer.    The following portions of the chart were reviewed this encounter and updated as appropriate: medications, allergies, medical history  Review of Systems:  No other skin or systemic complaints except as noted in HPI or Assessment and Plan.  Objective  Well appearing patient in no apparent distress; mood and affect are within normal limits.  A full examination was performed including scalp, head, eyes, ears, nose, lips, neck, chest, axillae, abdomen, back, buttocks, bilateral upper extremities, bilateral lower extremities, hands, feet, fingers, toes, fingernails, and toenails. All findings within normal limits unless otherwise noted below.   Relevant physical exam findings are noted in the Assessment and Plan.  Left Flank 1.1 cm waxy brown stuck on papule with crusting hands 10 x 8 mm pink, waxy macule with keratotic rim right palm. 4 mm pink waxy macule with keratotic rim at left palmar thumb.  Left Popliteal Fossa x1, anterior lower neck x3 (4) Erythematous keratotic or waxy stuck-on papule B/L lower legs Tiny rust colored macules  Assessment & Plan   SKIN CANCER SCREENING PERFORMED TODAY.  ACTINIC DAMAGE. Face.  - Chronic condition, secondary to cumulative UV/sun exposure - diffuse scaly erythematous macules with underlying dyspigmentation - Recommend daily broad spectrum sunscreen SPF 30+ to sun-exposed areas, reapply every 2 hours as needed.  -  Staying in the shade or wearing long sleeves, sun glasses (UVA+UVB protection) and wide brim hats (4-inch brim around the entire circumference of the hat) are also recommended for sun protection.  - Call for new or changing lesions.  LENTIGINES, SEBORRHEIC KERATOSES, HEMANGIOMAS - Benign normal skin lesions - Benign-appearing - Call for any changes  MELANOCYTIC NEVI - Tan-brown and/or pink-flesh-colored symmetric macules and papules - Benign appearing on exam today - Observation - Call clinic for new or changing moles - Recommend daily use of broad spectrum spf 30+ sunscreen to sun-exposed areas.   EPIDERMAL INCLUSION CYST Exam: 1.5 cm Subcutaneous nodule at left upper back  Benign-appearing. Exam most consistent with an epidermal inclusion cyst. Discussed that a cyst is a benign growth that can grow over time and sometimes get irritated or inflamed. Recommend observation if it is not bothersome. Discussed option of surgical excision to remove it if it is growing, symptomatic, or other changes noted. Please call for new or changing lesions so they can be evaluated.  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  Observation.  Call clinic for new or changing lesions.  Recommend daily use of broad spectrum spf 30+ sunscreen to sun-exposed areas.   ROSACEA Exam Mid face erythema with telangiectasias, inflammatory papule at right cheek.   Chronic and persistent condition with duration or expected duration over one year. Condition is bothersome/symptomatic for patient. Currently flared.   Rosacea is a chronic progressive skin condition usually affecting the face of adults, causing redness and/or acne bumps. It is treatable but not curable. It sometimes  affects the eyes (ocular rosacea) as well. It may respond to topical and/or systemic medication and can flare with stress, sun exposure, alcohol, exercise, topical steroids (including  hydrocortisone/cortisone 10) and some foods.  Daily application of broad spectrum spf 30+ sunscreen to face is recommended to reduce flares.  Patient denies grittiness of the eyes  Treatment Plan Start Metronidazole  0.75% cream twice a day as needed for rosacea.  Xerosis - diffuse xerotic patches - recommend gentle, hydrating skin care - gentle skin care handout given   NEOPLASM OF SKIN Left Flank Epidermal / dermal shaving  Lesion diameter (cm):  1.1 Informed consent: discussed and consent obtained   Patient was prepped and draped in usual sterile fashion: Area prepped with alcohol. Anesthesia: the lesion was anesthetized in a standard fashion   Anesthetic:  1% lidocaine  w/ epinephrine  1-100,000 buffered w/ 8.4% NaHCO3 Instrument used: flexible razor blade   Hemostasis achieved with: pressure, aluminum chloride and electrodesiccation   Outcome: patient tolerated procedure well   Post-procedure details: wound care instructions given   Post-procedure details comment:  Ointment and small bandage applied Specimen 1 - Surgical pathology Differential Diagnosis: ISK vs other  Check Margins: No Symptomatic, irritating, patient would like it removed.  POROKERATOSIS hands Benign-appearing.  Observation.  No changes per pt. Call clinic for new or changing lesions.  Recommend daily use of broad spectrum spf 30+ sunscreen to sun-exposed areas.   Could consider cryotherapy. Patient deferred treatment at this time.  INFLAMED SEBORRHEIC KERATOSIS (4) Left Popliteal Fossa x1, anterior lower neck x3 (4) Symptomatic, irritating, patient would like treated. Destruction of lesion - Left Popliteal Fossa x1, anterior lower neck x3 (4)  Destruction method: cryotherapy   Informed consent: discussed and consent obtained   Lesion destroyed using liquid nitrogen: Yes   Region frozen until ice ball extended beyond lesion: Yes   Outcome: patient tolerated procedure well with no complications    Post-procedure details: wound care instructions given   Additional details:  Prior to procedure, discussed risks of blister formation, small wound, skin dyspigmentation, or rare scar following cryotherapy. Recommend Vaseline ointment to treated areas while healing.  SCHAMBERG'S PURPURA B/L lower legs Benign, observe.    Recommend wearing compression stockings. Discussed adding steroid topical cream. Patient deferred treatment at this time.  Recommend using CeraVe lotion for diabetics. Samples given.   Return in about 1 year (around 02/28/2025) for TBSE.  I, Darcie Easterly, CMA, am acting as scribe for Artemio Larry, MD.   Documentation: I have reviewed the above documentation for accuracy and completeness, and I agree with the above.  Artemio Larry, MD

## 2024-03-02 ENCOUNTER — Ambulatory Visit
Admission: RE | Admit: 2024-03-02 | Discharge: 2024-03-02 | Disposition: A | Discharge status: Home / Self Care | Source: Ambulatory Visit | Attending: Family Medicine | Admitting: Family Medicine

## 2024-03-02 DIAGNOSIS — N183 Chronic kidney disease, stage 3 unspecified: Secondary | ICD-10-CM | POA: Diagnosis not present

## 2024-03-02 DIAGNOSIS — N189 Chronic kidney disease, unspecified: Secondary | ICD-10-CM | POA: Diagnosis not present

## 2024-03-02 DIAGNOSIS — N281 Cyst of kidney, acquired: Secondary | ICD-10-CM | POA: Insufficient documentation

## 2024-03-02 DIAGNOSIS — E1122 Type 2 diabetes mellitus with diabetic chronic kidney disease: Secondary | ICD-10-CM | POA: Insufficient documentation

## 2024-03-04 ENCOUNTER — Encounter: Payer: Self-pay | Admitting: Family Medicine

## 2024-03-04 LAB — SURGICAL PATHOLOGY

## 2024-03-07 ENCOUNTER — Telehealth: Payer: Self-pay

## 2024-03-07 NOTE — Telephone Encounter (Signed)
 Advised patient biopsy of the left flank was benign inflamed seborrheic keratosis. No further treatment needed.

## 2024-03-07 NOTE — Telephone Encounter (Signed)
-----   Message from Katherine Oliver sent at 03/07/2024 10:20 AM EDT ----- 1. Skin, left flank :       SEBORRHEIC KERATOSIS, IRRITATED   Benign ISK - please call patient

## 2024-03-17 DIAGNOSIS — E119 Type 2 diabetes mellitus without complications: Secondary | ICD-10-CM | POA: Diagnosis not present

## 2024-03-17 LAB — HM DIABETES EYE EXAM

## 2024-03-18 DIAGNOSIS — Z860101 Personal history of adenomatous and serrated colon polyps: Secondary | ICD-10-CM | POA: Diagnosis not present

## 2024-03-18 DIAGNOSIS — K529 Noninfective gastroenteritis and colitis, unspecified: Secondary | ICD-10-CM | POA: Diagnosis not present

## 2024-04-04 ENCOUNTER — Encounter: Payer: Self-pay | Admitting: Family Medicine

## 2024-04-04 ENCOUNTER — Ambulatory Visit: Admitting: Family Medicine

## 2024-04-04 ENCOUNTER — Ambulatory Visit: Admitting: Internal Medicine

## 2024-04-04 VITALS — BP 130/72 | HR 78 | Temp 98.9°F | Ht 60.5 in | Wt 177.1 lb

## 2024-04-04 DIAGNOSIS — J029 Acute pharyngitis, unspecified: Secondary | ICD-10-CM

## 2024-04-04 DIAGNOSIS — J020 Streptococcal pharyngitis: Secondary | ICD-10-CM

## 2024-04-04 LAB — POC COVID19 BINAXNOW: SARS Coronavirus 2 Ag: NEGATIVE

## 2024-04-04 LAB — POCT RAPID STREP A (OFFICE): Rapid Strep A Screen: POSITIVE — AB

## 2024-04-04 MED ORDER — PENICILLIN V POTASSIUM 500 MG PO TABS: 500.0000 mg | ORAL_TABLET | Freq: Two times a day (BID) | ORAL | 0 refills | Status: DC

## 2024-04-04 NOTE — Telephone Encounter (Signed)
 Attempted to contact pt. No answer, vm box not set up. Both pt and pt's husband Katherine Oliver- [phone]] will need OVs to be evaluated in order to provide the best treatment options. Plz schedule appts with an available provider.   I'm also sending above info to pt via MyChart.

## 2024-04-04 NOTE — Progress Notes (Signed)
 Katherine Nissen T. Tarrell Debes, MD, CAQ Sports Medicine [facility] [address] [place], [address]  Phone: [phone]  FAX: [phone]  Kateleen Encarnacion - 78 y.o. female  MRN [id number]  Date of Birth: 10-05-1946  Date: 04/04/2024  PCP: Claire Crick, MD  Referral: Claire Crick, MD  Chief Complaint  Patient presents with   Nasal Congestion   Sore Throat    Started Yesterday   Subjective:   Jacayla Nordell is a 78 y.o. very pleasant female patient with Body mass index is 34.02 kg/m. who presents with the following:  She is a pleasant 78 year old patient, she presents today with acute illness.  She started to have some symptoms including sore throat, fever that started yesterday.  Did have a temp of 99 She is a non-smoker  She does not have any kind of significant coughing, and she has no nausea, vomiting, diarrhea.  She has some minimal nasal congestion.  She is eating and drinking fine  This morning, St, fever, st Runny  99 Temp  Non-smoker   No new gi symtoms    Review of Systems is noted in the HPI, as appropriate  Objective:   BP 130/72   Pulse 78   Temp 98.9 F (37.2 C) (Temporal)   Ht 5' 0.5" (1.537 m)   Wt 177 lb 2 oz (80.3 kg)   SpO2 95%   BMI 34.02 kg/m    Gen: WDWN, NAD. Globally Non-toxic HEENT: Throat clear, w/o exudate, R TM clear, L TM - good landmarks, No fluid present. No rhinnorhea.  MMM Frontal sinuses: NT Max sinuses: NT NECK: Anterior cervical  LAD is absent CV: RRR, No M/G/R, cap refill <2 sec PULM: Breathing comfortably in no respiratory distress. no wheezing, crackles, rhonchi   Laboratory and Imaging Data: Results for orders placed or performed in visit on 04/04/24  POC COVID-19   Collection Time: 04/04/24 11:41 AM  Result Value Ref Range   SARS Coronavirus 2 Ag Negative Negative  POCT rapid strep A   Collection Time: 04/04/24 11:42 AM  Result Value Ref  Range   Rapid Strep A Screen Positive (A) Negative     Assessment and Plan:     ICD-10-CM   1. Strep throat  J02.0     2. Sore throat  J02.9 POC COVID-19    POCT rapid strep A     Acute strep throat, treat with Pen-Vee K.  Medication Management during today's office visit: Meds ordered this encounter  Medications   penicillin v potassium (VEETID) 500 MG tablet    Sig: Take 1 tablet (500 mg total) by mouth 2 (two) times daily.    Dispense:  14 tablet    Refill:  0   There are no discontinued medications.  Orders placed today for conditions managed today: Orders Placed This Encounter  Procedures   POC COVID-19   POCT rapid strep A    Disposition: No follow-ups on file.  Dragon [employer] One speech-to-text software was used for transcription in this dictation.  Possible transcriptional errors can occur using Animal nutritionist.   Signed,  Ranny Bye. Derral Colucci, MD   Outpatient Encounter Medications as of 04/04/2024  Medication Sig   ACCU-CHEK AVIVA PLUS test strip 1 EACH BY OTHER ROUTE 3 (THREE) TIMES DAILY. AND LANCETS 4/DAY   acetaminophen  (TYLENOL ) 500 MG tablet Take 500 mg by mouth 2 (two) times daily.   amLODipine  (NORVASC ) 10 MG tablet Take 1 tablet (10  mg total) by mouth daily.   B-D UF III MINI PEN NEEDLES 31G X 5 MM MISC INJECT 1 DEVICE INTO THE SKIN 4 (FOUR) TIMES DAILY.   Baclofen  5 MG TABS Take 1 tablet (5 mg total) by mouth at bedtime as needed.   benazepril -hydrochlorthiazide (LOTENSIN  HCT) 20-12.5 MG tablet TAKE 1 TABLET BY MOUTH DAILY   Cholecalciferol (VITAMIN D3) 25 MCG (1000 UT) CAPS Take 2 capsules (2,000 Units total) by mouth daily.   Continuous Glucose Sensor (DEXCOM G7 SENSOR) MISC Use on skin q10 days to check sugars E11.65, Z79.4   CRANBERRY PO Take 4,200 mg by mouth 2 (two) times daily.   CREON  36000-114000 units CPEP capsule Take 2 capsules (72,000 Units total) by mouth 3 (three) times daily before meals.   D-MANNOSE PO Take 2 tablets by mouth daily  with lunch.   diclofenac  Sodium (VOLTAREN ) 1 % GEL Apply 2 g topically 4 (four) times daily.   estradiol  (ESTRACE ) 0.1 MG/GM vaginal cream (DISCARD APPLICATOR) APPLY PEA SIZED AMOUNT TO TIP OF FINGER TO URETHRA BEFORE BED. WASH HANDS WELL AFTER APPLICATION. USE MONDAY, WEDNESDAY AND FRIDAY   ferrous sulfate  325 (65 FE) MG tablet Take 1 tablet (325 mg total) by mouth every other day.   fluticasone  (FLONASE ) 50 MCG/ACT nasal spray Place 1 spray into both nostrils daily.   folic acid  (FOLVITE ) 1 MG tablet TAKE 1 TABLET BY MOUTH DAILY   Glucagon  (BAQSIMI  ONE PACK) 3 MG/DOSE POWD Place 3 mg into the nose daily as needed (hypoglycemia).   insulin  glargine (LANTUS ) 100 UNIT/ML Solostar Pen Inject 26 Units into the skin at bedtime.   insulin  lispro (HUMALOG  KWIKPEN) 200 UNIT/ML KwikPen Max daily 100 units   loperamide  (IMODIUM  A-D) 2 MG tablet Take 4 mg by mouth 4 (four) times daily as needed for diarrhea or loose stools.   Magnesium  400 MG TABS Take 1 tablet by mouth daily.   meclizine  (ANTIVERT ) 25 MG tablet Take 1 tablet (25 mg total) by mouth 3 (three) times daily as needed for nausea.   metoprolol  succinate (TOPROL -XL) 50 MG 24 hr tablet TAKE 1 TABLET BY MOUTH DAILY  WITH OR IMMEDIATELY FOLLOWING A  MEAL   metroNIDAZOLE  (METROCREAM ) 0.75 % cream Apply twice daily to face as needed for rosacea   Multiple Vitamin (MULTIVITAMIN WITH MINERALS) TABS tablet Take 1 tablet by mouth daily.   pantoprazole  (PROTONIX ) 40 MG tablet Take 1 tablet (40 mg total) by mouth every evening.   penicillin v potassium (VEETID) 500 MG tablet Take 1 tablet (500 mg total) by mouth 2 (two) times daily.   rosuvastatin  (CRESTOR ) 10 MG tablet Take 1 tablet (10 mg total) by mouth daily.   triamcinolone  (KENALOG ) 0.1 % paste APPLY SPARINGLY 2-3 TIMES DAILY AS DIRECTED (Patient taking differently: APPLY SPARINGLY 2-3 TIMES DAILY AS DIRECTED AS NEEDED)   No facility-administered encounter medications on file as of 04/04/2024.

## 2024-04-04 NOTE — Telephone Encounter (Signed)
 Pt rtn call. Scheduled appts for she and husband at 2:45 and 3:00 today with Dr Joelle Musca. Pt expresses her thanks.

## 2024-04-04 NOTE — Telephone Encounter (Signed)
 Looks like Dr Allison Arena can see pt/pt's husband this AM.  Spoke with pt and offered earlier appts for this AM with Dr Geralyn Knee. She agreed and both pt's were moved to Dr Copland's schedule at 11:00 and 11:20. Pt greatly expresses her thanks for helping them out.

## 2024-04-15 ENCOUNTER — Ambulatory Visit (INDEPENDENT_AMBULATORY_CARE_PROVIDER_SITE_OTHER): Payer: Medicare Other | Admitting: Internal Medicine

## 2024-04-15 ENCOUNTER — Encounter: Payer: Self-pay | Admitting: Internal Medicine

## 2024-04-15 VITALS — BP 110/68 | HR 78 | Ht 60.5 in | Wt 176.0 lb

## 2024-04-15 DIAGNOSIS — Z794 Long term (current) use of insulin: Secondary | ICD-10-CM | POA: Diagnosis not present

## 2024-04-15 DIAGNOSIS — E1122 Type 2 diabetes mellitus with diabetic chronic kidney disease: Secondary | ICD-10-CM

## 2024-04-15 DIAGNOSIS — N1832 Chronic kidney disease, stage 3b: Secondary | ICD-10-CM

## 2024-04-15 DIAGNOSIS — E1165 Type 2 diabetes mellitus with hyperglycemia: Secondary | ICD-10-CM | POA: Diagnosis not present

## 2024-04-15 LAB — POCT GLYCOSYLATED HEMOGLOBIN (HGB A1C): Hemoglobin A1C: 7.3 % — AB (ref 4.0–5.6)

## 2024-04-15 MED ORDER — DEXCOM G7 SENSOR MISC: 3 refills | Status: AC

## 2024-04-15 MED ORDER — INSULIN GLARGINE 100 UNIT/ML SOLOSTAR PEN: 30.0000 [IU] | PEN_INJECTOR | Freq: Every day | SUBCUTANEOUS | 3 refills | Status: DC

## 2024-04-15 MED ORDER — HUMALOG KWIKPEN 200 UNIT/ML ~~LOC~~ SOPN: PEN_INJECTOR | SUBCUTANEOUS | 3 refills | Status: DC

## 2024-04-15 MED ORDER — DEXCOM G7 SENSOR MISC: 3 refills | Status: DC

## 2024-04-15 MED ORDER — BD PEN NEEDLE MINI U/F 31G X 5 MM MISC: 1.0000 | Freq: Four times a day (QID) | 3 refills | Status: DC

## 2024-04-15 NOTE — Patient Instructions (Addendum)
 Increase Lantus  30 units daily  Change Humalog  24 units with each meal  Snack time Humalog  10-16 units  Humalog  correctional insulin : ADD extra units on insulin  to your meal-time Humalog  dose if your blood sugars are higher than 160. Use the scale below to help guide you:   Blood sugar before meal Number of units to inject  Less than 160 0 unit  161 - 190 1 unit  191 - 220 2 units  221 - 250 3 units  251 - 280 4 units  281 - 310 5 units  311 - 340 6 units  341 - 370 7 units  371 - 400 8 units     HOW TO TREAT LOW BLOOD SUGARS (Blood sugar LESS THAN 70 MG/DL) Please follow the RULE OF 15 for the treatment of hypoglycemia treatment (when your (blood sugars are less than 70 mg/dL)   STEP 1: Take 15 grams of carbohydrates when your blood sugar is low, which includes:  3-4 GLUCOSE TABS  OR 3-4 OZ OF JUICE OR REGULAR SODA OR ONE TUBE OF GLUCOSE GEL    STEP 2: RECHECK blood sugar in 15 MINUTES STEP 3: If your blood sugar is still low at the 15 minute recheck --> then, go back to STEP 1 and treat AGAIN with another 15 grams of carbohydrates.

## 2024-04-15 NOTE — Progress Notes (Signed)
 Name: Katherine Oliver  Age/ Sex: 78 y.o., female   MRN/ DOB: [id number], October 04, 1946     PCP: Claire Crick, MD   Reason for Endocrinology Evaluation: Type 2 Diabetes Mellitus  Initial Endocrine Consultative Visit: 11/25/2021    PATIENT IDENTIFIER: Katherine Oliver is a 78 y.o. female with a past medical history of DM, HTN, Hx of pancreatitis . The patient has followed with Endocrinology clinic since 11/25/2021 for consultative assistance with management of her diabetes.  DIABETIC HISTORY:  Katherine Oliver was diagnosed with DM 2015, and started insulin  therapy in 2021.Pt has Hx of pancreatitis . Her hemoglobin A1c has ranged from 6.5% in 2016, peaking at 8.3% in 2023.   Hx of DKA in 2021  Saw Dr. Washington Hacker from  2016 until 01/2022  SUBJECTIVE:   During the last visit (10/16/2023): A1c 7.5%     Today (04/15/2024): Katherine Oliver  She checks her blood sugars multiple  times daily, through CGM. The patient has not had hypoglycemic episodes since the last clinic visit.  She has a history of recurrent UTIs , requiring urology follow-up at some point.   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- takes imodium      HOME DIABETES REGIMEN:  Lantus  26 units daily  Humalog  20 units TIDQAC Humalog  10-16 units with bedtime snack CF : Humalog  (BG-130/30)    Statin: yes ACE-I/ARB: yes     CONTINUOUS GLUCOSE MONITORING RECORD INTERPRETATION    Dates of Recording: 5/31 - 04/15/2024  Sensor description:dexcom  Results statistics:   CGM use % of time 96  Average and SD 216/53  Time in range 25 %  % Time Above 180 48  % Time above 250 27  % Time Below target 0    Glycemic patterns summary: BG's trend down overnight and increased throughout the day Hyperglycemic episodes  postprandial   Hypoglycemic episodes occurred N/A  Overnight periods: Trends down    DIABETIC COMPLICATIONS: Microvascular complications:   CKD III Denies:  Last Eye Exam: Completed 03/24/2022  Macrovascular complications:   Denies: CAD, CVA, PVD   HISTORY:  Past Medical History:  Past Medical History:  Diagnosis Date   Allergic rhinitis    Allergy 1967   Anemia 2021   Arthritis 2010   Blood transfusion without reported diagnosis 2021   several in 2021   Breast mass, right 08/2014   biopsy benign - [facility]   Cataract 2015   Clotting disorder (HCC) 2019   nosebleeds, cauterized 2020   Colon polyp 09/2008   tubulovillous adenoma, rpt 3-5 yrs   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 nephropathy (HCC)    DSME at [facility] 01/2016    COVID-19 virus infection 11/10/2022   DKA (diabetic ketoacidoses) 04/22/2020   Frequent epistaxis 05/16/2019   S/p cauterization with resolution 2020   GERD (gastroesophageal reflux disease)    Hepatic steatosis    by abd US  05/2012, mild transaminitis - normal iron sat and viral hep panel (2011), stable US  2017   History of chicken pox    History of measles    History of recurrent UTIs    on chronic keflex    HLD (hyperlipidemia)    HTN (hypertension)    Hypertensive retinopathy of both eyes, 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   Bulakowski   Kidney cyst, acquired 01/2016   L kidney by US    Kidney stone 01/2016   L kidney by US    Lung nodules 11/2013   overall stable  on f/u CT 01/2016   Osteopenia 06/2013   mild, forearm T -1.1, hip and spine WNL   Pancreatitis    Polycythemia    mild, stable (2013)   Primary localized osteoarthritis of right knee 01/05/2019   Rosacea    metrogel    Ulcer 2021   ulcers treated and are gone   Past Surgical History:  Past Surgical History:  Procedure Laterality Date   APPENDECTOMY  1987   BILIARY STENT PLACEMENT  12/14/2019   Procedure: BILIARY STENT PLACEMENT;  Surgeon: Alvis Jourdain, MD;  Location: [facility] ENDOSCOPY;  Service: Endoscopy;;   BREAST BIOPSY Right 1963   benign   BREAST BIOPSY Right 08/2014   benign- core   cardiolite stress test   04/2004   normal   CESAREAN SECTION  [id number]   x2   CHOLECYSTECTOMY  2003   COLONOSCOPY  09/26/2008   adenomatous polyp, rpt 3-5 yrs   COLONOSCOPY  08/2012   adenomatous polyps, diverticulosis, rec rpt 5 yrs Peg Bouton)   COLONOSCOPY WITH PROPOFOL  N/A 02/05/2018   4TA, SSA, diverticulosis, rpt 3 yrs Peg Bouton, Leonarda Rakers, MD)   COLONOSCOPY WITH PROPOFOL  N/A 10/14/2022   fair prep, TAx2 one was 1cm in size, diverticulosis, rpt 1 yr given fair prep Emerick Hanlon, Leanora Prophet, MD)   COSMETIC SURGERY  2022   lower  eyelids to prevent rubbing on eye surface   dexa  2003   normal   dexa  06/2013   [facility] - Tscore -1.1 forearm, normal spine and femur   ERCP N/A 12/14/2019   Procedure: ENDOSCOPIC RETROGRADE CHOLANGIOPANCREATOGRAPHY (ERCP);  Surgeon: Alvis Jourdain, MD;  Location: [facility] ENDOSCOPY;  Service: Endoscopy;  Laterality: N/A;   ERCP  01/2020   nonbleeding gastric ulcer - [facility] hospitalization (Dr Matthews Sons)   ESOPHAGOGASTRODUODENOSCOPY N/A 12/19/2019   Procedure: ESOPHAGOGASTRODUODENOSCOPY (EGD);  Surgeon: Tami Falcon, MD;  Location: [facility] ENDOSCOPY;  Service: Endoscopy;  Laterality: N/A;   ESOPHAGOGASTRODUODENOSCOPY  01/2020   pre existing AXIOS cystogastrostomy stent s/p necrosectomy - [facility] hospitalization (Dr Matthews Sons)   ESOPHAGOGASTRODUODENOSCOPY N/A 04/24/2020   Procedure: ESOPHAGOGASTRODUODENOSCOPY (EGD);  Surgeon: Celedonio Coil, MD;  Location: [facility] ENDOSCOPY;  Service: Endoscopy;  Laterality: N/A;   ESOPHAGOGASTRODUODENOSCOPY  05/2020   normal esophagus, duodenum, erythematous mucosa in gastric body, gastric cystogastrostomy stent removed ([facility])   IR FLUORO GUIDE CV LINE RIGHT  12/27/2019   IR FLUORO GUIDE CV LINE RIGHT  04/24/2020   IR IVC FILTER PLMT / S&I /IMG GUID/MOD SED  04/26/2020   IR IVC FILTER RETRIEVAL / S&I /IMG GUID/MOD SED  07/31/2021   IR RADIOLOGIST EVAL & MGMT  07/31/2020   IR RADIOLOGIST EVAL & MGMT  01/15/2021   IR RADIOLOGIST EVAL & MGMT  06/19/2021   IR REMOVAL TUN  CV CATH W/O FL  01/03/2020   IR REMOVAL TUN CV CATH W/O FL  04/29/2020   IR REPLC DUODEN/JEJUNO TUBE PERCUT W/FLUORO  04/01/2020   IR US  GUIDE VASC ACCESS RIGHT  04/24/2020   JOINT REPLACEMENT  March, 2020   right knee joint replacement   SPHINCTEROTOMY  12/14/2019   Procedure: SPHINCTEROTOMY;  Surgeon: Alvis Jourdain, MD;  Location: [facility] ENDOSCOPY;  Service: Endoscopy;;   TOTAL KNEE ARTHROPLASTY Right 01/17/2019   Procedure: TOTAL KNEE ARTHROPLASTY;  Surgeon: Elly Habermann, MD;  Location: [facility] ORS;  Service: Orthopedics;  Laterality: Right;   TRANSTHORACIC ECHOCARDIOGRAM  04/2019   EF 55-60%, modLVH, impaired relaxation    TRIGGER FINGER RELEASE  2007;2010;2011   bilateral   TRIGGER FINGER RELEASE  02/2017   VAGINAL HYSTERECTOMY  1984   for menorrhagia, ovaries in place   Social History:  reports that she has never smoked. She has never been exposed to tobacco smoke. She has never used smokeless tobacco. She reports that she does not currently use alcohol. She reports that she does not use drugs. Family History:  Family History  Problem Relation Age of Onset   Stroke Mother        several   Hyperlipidemia Mother    Hypertension Mother    Arthritis Mother    Vision loss Mother    Cancer Father        colon   Hypertension Father    Hyperlipidemia Father    Coronary artery disease Father 67       MIx1, CABG   Heart disease Father    Cancer Paternal Aunt        abdominal   Coronary artery disease Maternal Grandmother    Diabetes Maternal Grandfather    Coronary artery disease Maternal Grandfather    Heart disease Maternal Grandfather    Varicose Veins Paternal Grandmother    Anxiety disorder Son    Diabetes Maternal Uncle    Hypertension Son    Miscarriages / Stillbirths Sister    Breast cancer Neg Hx      HOME MEDICATIONS: Allergies as of 04/15/2024       Reactions   Azithromycin  Itching   Okay if takes benadryl  along with it   Nickel    Reaction to cheap earrings  cause redness   Sulfa Antibiotics Itching   Vinegar [acetic Acid] Nausea Only   Adhesive [tape] Rash   Paper tape only causes blisters   Keflex  [cephalexin ] Rash        Medication List        Accurate as of April 15, 2024  1:19 PM. If you have any questions, ask your nurse or doctor.          Accu-Chek Aviva Plus test strip Generic drug: glucose blood 1 EACH BY OTHER ROUTE 3 (THREE) TIMES DAILY. AND LANCETS 4/DAY   acetaminophen  500 MG tablet Commonly known as: TYLENOL  Take 500 mg by mouth 2 (two) times daily.   amLODipine  10 MG tablet Commonly known as: NORVASC  Take 1 tablet (10 mg total) by mouth daily.   B-D UF III MINI PEN NEEDLES 31G X 5 MM Misc Generic drug: Insulin  Pen Needle INJECT 1 DEVICE INTO THE SKIN 4 (FOUR) TIMES DAILY.   Baclofen  5 MG Tabs Take 1 tablet (5 mg total) by mouth at bedtime as needed.   Baqsimi  One Pack 3 MG/DOSE Powd Generic drug: Glucagon  Place 3 mg into the nose daily as needed (hypoglycemia).   benazepril -hydrochlorthiazide 20-12.5 MG tablet Commonly known as: LOTENSIN  HCT TAKE 1 TABLET BY MOUTH DAILY   CRANBERRY PO Take 4,200 mg by mouth 2 (two) times daily.   Creon  36000-114000 units Cpep capsule Generic drug: lipase/protease/amylase Take 2 capsules (72,000 Units total) by mouth 3 (three) times daily before meals.   D-MANNOSE PO Take 2 tablets by mouth daily with lunch.   Dexcom G7 Sensor Misc Use on skin q10 days to check sugars E11.65, Z79.4   diclofenac  Sodium 1 % Gel Commonly known as: Voltaren  Apply 2 g topically 4 (four) times daily.   estradiol  0.1 MG/GM vaginal cream Commonly known as: ESTRACE  (DISCARD APPLICATOR) APPLY PEA SIZED AMOUNT TO TIP OF FINGER TO URETHRA BEFORE BED. WASH HANDS WELL AFTER APPLICATION. USE MONDAY, [facility] AND  FRIDAY   ferrous sulfate  325 (65 FE) MG tablet Take 1 tablet (325 mg total) by mouth every other day.   fluticasone  50 MCG/ACT nasal spray Commonly known as: FLONASE  Place  1 spray into both nostrils daily.   folic acid  1 MG tablet Commonly known as: FOLVITE  TAKE 1 TABLET BY MOUTH DAILY   HumaLOG  KwikPen 200 UNIT/ML KwikPen Generic drug: insulin  lispro Max daily 100 units   insulin  glargine 100 UNIT/ML Solostar Pen Commonly known as: LANTUS  Inject 26 Units into the skin at bedtime.   loperamide  2 MG tablet Commonly known as: IMODIUM  A-D Take 4 mg by mouth 4 (four) times daily as needed for diarrhea or loose stools.   Magnesium  400 MG Tabs Take 1 tablet by mouth daily.   meclizine  25 MG tablet Commonly known as: ANTIVERT  Take 1 tablet (25 mg total) by mouth 3 (three) times daily as needed for nausea.   metoprolol  succinate 50 MG 24 hr tablet Commonly known as: TOPROL -XL TAKE 1 TABLET BY MOUTH DAILY  WITH OR IMMEDIATELY FOLLOWING A  MEAL   metroNIDAZOLE  0.75 % cream Commonly known as: METROCREAM  Apply twice daily to face as needed for rosacea   multivitamin with minerals Tabs tablet Take 1 tablet by mouth daily.   pantoprazole  40 MG tablet Commonly known as: PROTONIX  Take 1 tablet (40 mg total) by mouth every evening.   penicillin  v potassium 500 MG tablet Commonly known as: VEETID Take 1 tablet (500 mg total) by mouth 2 (two) times daily.   rosuvastatin  10 MG tablet Commonly known as: CRESTOR  Take 1 tablet (10 mg total) by mouth daily.   triamcinolone  0.1 % paste Commonly known as: KENALOG  APPLY SPARINGLY 2-3 TIMES DAILY AS DIRECTED What changed: additional instructions   Vitamin D3 25 MCG (1000 UT) Caps Take 2 capsules (2,000 Units total) by mouth daily.         OBJECTIVE:   Vital Signs: BP 110/68 (BP Location: Left Arm, Patient Position: Sitting, Cuff Size: Normal)   Pulse 78   Ht 5' 0.5 (1.537 m)   Wt 176 lb (79.8 kg)   SpO2 97%   BMI 33.81 kg/m   Wt Readings from Last 3 Encounters:  04/15/24 176 lb (79.8 kg)  04/04/24 177 lb 2 oz (80.3 kg)  02/22/24 175 lb 8 oz (79.6 kg)     Exam: General: Pt appears well  and is in NAD  Lungs: Clear with good BS bilat   Heart: RRR   Extremities: No pretibial edema.  Neuro: MS is good with appropriate affect, pt is alert and Ox3    DM foot exam:04/15/2024  The skin of the feet is intact without sores or ulcerations. The pedal pulses are 2+ on right and 2+ on left. The sensation is intact to a screening 5.07, 10 gram monofilament bilaterally        DATA REVIEWED:  Lab Results  Component Value Date   HGBA1C 7.3 (A) 04/15/2024   HGBA1C 7.8 (H) 02/05/2024   HGBA1C 7.5 (A) 10/16/2023     Latest Reference Range & Units 02/05/24 08:47  Sodium 135 - 145 mEq/L 140  Potassium 3.5 - 5.1 mEq/L 4.6  Chloride 96 - 112 mEq/L 103  CO2 19 - 32 mEq/L 27  Glucose 70 - 99 mg/dL 409 (H)  BUN 6 - 23 mg/dL 33 (H)  Creatinine 8.11 - 1.20 mg/dL 9.14 (H)  Calcium  8.4 - 10.5 mg/dL 9.8  Phosphorus 2.3 - 4.6 mg/dL 3.4  Magnesium  1.5 -  2.5 mg/dL 1.4 (L)  Albumin  3.5 - 5.2 g/dL 4.7  GFR >16.10 mL/min 33.75 (L)  (H): Data is abnormally high (L): Data is abnormally low   ASSESSMENT / PLAN / RECOMMENDATIONS:   1) Type 2 Diabetes Mellitus, Optimally controlled, With CKD III complications - Most recent A1c of 7.3 %. Goal A1c < 7.5 %.    -A1c is trending down and at goal -She is NOT a candidate for GLP-1 agonist, DPP 4 inhibitors nor Mounjaro due to history of pancreatitis -Limited glycemic agents due to CKD -Not a candidate for SGLT2 inhibitors due to history of recurrent UTIs - Patient has been noted with postprandial hyperglycemia, will decrease insulin  as below  MEDICATIONS: Increase Lantus  30 units daily Change Humalog  24 units with each meal Continue Humalog  10-16 units with bedtime snack Continue  correction factor: Humalog  (BG -130/30)  EDUCATION / INSTRUCTIONS: BG monitoring instructions: Patient is instructed to check her blood sugars 4 times a day, before each meal ad bedtime . Call [facility] if: BG persistently < 70  I reviewed  the Rule of 15 for the treatment of hypoglycemia in detail with the patient. Literature supplied.     2) Diabetic complications:  Eye: Does not have known diabetic retinopathy.  Neuro/ Feet: Does not have known diabetic peripheral neuropathy .  Renal: Patient does  have known baseline CKD. She   is  on an ACEI/ARB at present.      F/U in 6 months   Signed electronically by: Natale Bail, MD  [facility] Endocrinology  [facility] [address] [place] [place], [place] [address] Phone: [phone] FAX: [phone]   CC: Claire Crick, MD [address] [place] [address] Phone: [phone]  Fax: [phone]  Return to Endocrinology clinic as below: Future Appointments  Date Time Provider Department Center  04/15/2024  1:20 PM Raydell Maners, Julian Obey, MD [facility] None  05/23/2024 10:30 AM Claire Crick, MD [facility]  10/12/2024 11:30 AM [facility] ANNUAL WELLNESS VISIT 1 [facility]  03/21/2025 10:45 AM Artemio Larry, MD [facility] None

## 2024-05-10 ENCOUNTER — Other Ambulatory Visit: Payer: Self-pay | Admitting: Internal Medicine

## 2024-05-23 ENCOUNTER — Ambulatory Visit: Payer: Self-pay | Admitting: Family Medicine

## 2024-05-23 ENCOUNTER — Encounter: Payer: Self-pay | Admitting: Family Medicine

## 2024-05-23 ENCOUNTER — Ambulatory Visit (INDEPENDENT_AMBULATORY_CARE_PROVIDER_SITE_OTHER): Admitting: Family Medicine

## 2024-05-23 VITALS — BP 130/74 | HR 74 | Temp 98.7°F | Ht 60.5 in | Wt 176.0 lb

## 2024-05-23 DIAGNOSIS — E1122 Type 2 diabetes mellitus with diabetic chronic kidney disease: Secondary | ICD-10-CM

## 2024-05-23 DIAGNOSIS — Z8719 Personal history of other diseases of the digestive system: Secondary | ICD-10-CM | POA: Diagnosis not present

## 2024-05-23 DIAGNOSIS — I10 Essential (primary) hypertension: Secondary | ICD-10-CM

## 2024-05-23 DIAGNOSIS — Z794 Long term (current) use of insulin: Secondary | ICD-10-CM | POA: Diagnosis not present

## 2024-05-23 DIAGNOSIS — N183 Chronic kidney disease, stage 3 unspecified: Secondary | ICD-10-CM

## 2024-05-23 DIAGNOSIS — R801 Persistent proteinuria, unspecified: Secondary | ICD-10-CM | POA: Diagnosis not present

## 2024-05-23 DIAGNOSIS — K219 Gastro-esophageal reflux disease without esophagitis: Secondary | ICD-10-CM

## 2024-05-23 DIAGNOSIS — E1169 Type 2 diabetes mellitus with other specified complication: Secondary | ICD-10-CM | POA: Diagnosis not present

## 2024-05-23 LAB — RENAL FUNCTION PANEL
Albumin: 4.9 g/dL (ref 3.5–5.2)
BUN: 34 mg/dL — ABNORMAL HIGH (ref 6–23)
CO2: 27 meq/L (ref 19–32)
Calcium: 10 mg/dL (ref 8.4–10.5)
Chloride: 103 meq/L (ref 96–112)
Creatinine, Ser: 1.34 mg/dL — ABNORMAL HIGH (ref 0.40–1.20)
GFR: 37.64 mL/min — ABNORMAL LOW (ref >60.00)
Glucose, Bld: 153 mg/dL — ABNORMAL HIGH (ref 70–99)
Phosphorus: 3.2 mg/dL (ref 2.3–4.6)
Potassium: 4.7 meq/L (ref 3.5–5.1)
Sodium: 140 meq/L (ref 135–145)

## 2024-05-23 LAB — MICROALBUMIN / CREATININE URINE RATIO
Creatinine,U: 48 mg/dL
Microalb Creat Ratio: 983.6 mg/g — ABNORMAL HIGH (ref 0.0–30.0)
Microalb, Ur: 47.2 mg/dL — ABNORMAL HIGH (ref 0.0–1.9)

## 2024-05-23 MED ORDER — FLUTICASONE PROPIONATE 50 MCG/ACT NA SUSP: 1.0000 | Freq: Every day | NASAL | 3 refills | Status: AC | PRN

## 2024-05-23 NOTE — Assessment & Plan Note (Signed)
 Rec stop nightly aspirin  325mg  she was taking. Rec in in its place tylenol  500-1000mg  nightly.

## 2024-05-23 NOTE — Assessment & Plan Note (Signed)
 Chronic, appreciate endo care. Notes improved sugar control with recent insulin  changes.

## 2024-05-23 NOTE — Assessment & Plan Note (Addendum)
 Chronic kidney diease stage 3  24 hour urine protein dropped from 1.6gm to 987 mg/day.  Renal US  overall reassuring  Discussed nephrology eval if progressively deteriorating kidney function, update levels today along with Umicroalb/cr

## 2024-05-23 NOTE — Progress Notes (Signed)
 Ph: [phone] Fax: [phone]   Patient ID: Katherine Oliver, female    DOB: 1946/06/06, 78 y.o.   MRN: [id number]  This visit was conducted in person.  BP 130/74   Pulse 74   Temp 98.7 F (37.1 C) (Oral)   Ht 5' 0.5 (1.537 m)   Wt 176 lb (79.8 kg)   SpO2 93%   BMI 33.81 kg/m    CC: 3 mo f/u visit  Subjective:   HPI: Katherine Oliver is a 78 y.o. female presenting on 05/23/2024 for Medical Management of Chronic Issues (With husband Ozell  in office)   Upcoming colonoscopy next week through [facility].   DM - followed by endo Dr Sam on lantus  30u daily, humalog  24u with meals and 10-16u with bedtime snack.  H/o necrotizing pancreatitis. H/o recurrent UTIs.   HTN - compliant on amlodipine  10mg  daily, benazepril  hydrochlorothiazide  20/12.5mg  daily, Toprol  XL 50mg  daily.   GERD - continues daily PPI for h/o GI bleed during acute illness hospitalization 2021 (pancreatitis) presumed from gastric ulcer at that time   CKD stage 3 - UPEP with protein 1660 mg/day (11/2023), dropped to 987 mg/day(02/2024). No M spike, normal free kappa and lambda light chains in urine. Mildly elevated kappa light chains in serum (23.5) with normal ratio. SPEP/IFE WNL 12/2023 except for mildly decreased gamma globulin (0.6). UA without active sediment 11/2023.  Renal US  02/2024 - L kidney cysts, otherwise normal.   She is taking aspirin  325mg  at night PRN pain. This helps her sleep better.      Relevant past medical, surgical, family and social history reviewed and updated as indicated. Interim medical history since our last visit reviewed. Allergies and medications reviewed and updated. Outpatient Medications Prior to Visit  Medication Sig Dispense Refill   ACCU-CHEK AVIVA PLUS test strip 1 EACH BY OTHER ROUTE 3 (THREE) TIMES DAILY. AND LANCETS 4/DAY 300 strip 3   acetaminophen  (TYLENOL ) 500 MG tablet Take 500 mg by mouth 2 (two) times daily.     amLODipine  (NORVASC )  10 MG tablet Take 1 tablet (10 mg total) by mouth daily. 90 tablet 3   Baclofen  5 MG TABS Take 1 tablet (5 mg total) by mouth at bedtime as needed. 10 tablet 0   benazepril -hydrochlorthiazide (LOTENSIN  HCT) 20-12.5 MG tablet TAKE 1 TABLET BY MOUTH DAILY 90 tablet 3   Cholecalciferol (VITAMIN D3) 25 MCG (1000 UT) CAPS Take 2 capsules (2,000 Units total) by mouth daily.     Continuous Glucose Sensor (DEXCOM G7 SENSOR) MISC Use on skin q10 days to check sugars E11.65, Z79.4 9 each 3   CRANBERRY PO Take 4,200 mg by mouth 2 (two) times daily.     CREON  36000-114000 units CPEP capsule Take 2 capsules (72,000 Units total) by mouth 3 (three) times daily before meals. 600 capsule 3   D-MANNOSE PO Take 2 tablets by mouth daily with lunch.     estradiol  (ESTRACE ) 0.1 MG/GM vaginal cream (DISCARD APPLICATOR) APPLY PEA SIZED AMOUNT TO TIP OF FINGER TO URETHRA BEFORE BED. WASH HANDS WELL AFTER APPLICATION. USE MONDAY, WEDNESDAY AND FRIDAY 42.5 g 11   ferrous sulfate  325 (65 FE) MG tablet Take 1 tablet (325 mg total) by mouth every other day.     folic acid  (FOLVITE ) 1 MG tablet TAKE 1 TABLET BY MOUTH DAILY 90 tablet 3   Glucagon  (BAQSIMI  ONE PACK) 3 MG/DOSE POWD Place 3 mg into the nose daily as needed (hypoglycemia).     insulin  glargine (  LANTUS ) 100 UNIT/ML Solostar Pen Inject 30 Units into the skin at bedtime. 30 mL 3   insulin  lispro (HUMALOG  KWIKPEN) 200 UNIT/ML KwikPen Max daily 100 units 45 mL 3   Insulin  Pen Needle (B-D UF III MINI PEN NEEDLES) 31G X 5 MM MISC Inject 1 Device into the skin 4 (four) times daily. 400 each 3   loperamide  (IMODIUM  A-D) 2 MG tablet Take 4 mg by mouth 4 (four) times daily as needed for diarrhea or loose stools.     Magnesium  400 MG TABS Take 1 tablet by mouth daily.     meclizine  (ANTIVERT ) 25 MG tablet Take 1 tablet (25 mg total) by mouth 3 (three) times daily as needed for nausea. 30 tablet 0   metoprolol  succinate (TOPROL -XL) 50 MG 24 hr tablet TAKE 1 TABLET BY MOUTH DAILY   WITH OR IMMEDIATELY FOLLOWING A  MEAL 90 tablet 4   metroNIDAZOLE  (METROCREAM ) 0.75 % cream Apply twice daily to face as needed for rosacea 45 g 2   Multiple Vitamin (MULTIVITAMIN WITH MINERALS) TABS tablet Take 1 tablet by mouth daily.     pantoprazole  (PROTONIX ) 40 MG tablet Take 1 tablet (40 mg total) by mouth every evening. 90 tablet 4   rosuvastatin  (CRESTOR ) 10 MG tablet Take 1 tablet (10 mg total) by mouth daily. 90 tablet 4   fluticasone  (FLONASE ) 50 MCG/ACT nasal spray Place 1 spray into both nostrils daily. 16 g 11   penicillin  v potassium (VEETID) 500 MG tablet Take 1 tablet (500 mg total) by mouth 2 (two) times daily. 14 tablet 0   triamcinolone  (KENALOG ) 0.1 % paste APPLY SPARINGLY 2-3 TIMES DAILY AS DIRECTED (Patient not taking: Reported on 05/23/2024) 5 g 1   diclofenac  Sodium (VOLTAREN ) 1 % GEL Apply 2 g topically 4 (four) times daily. (Patient not taking: Reported on 05/23/2024) 50 g 1   No facility-administered medications prior to visit.    Past Medical History:  Diagnosis Date   Allergic rhinitis    Allergy 1967   Anemia 2021   Arthritis 2010   Blood transfusion without reported diagnosis 2021   several in 2021   Breast mass, right 08/2014   biopsy benign - [facility]   Cataract 2015   Clotting disorder (HCC) 2019   nosebleeds, cauterized 2020   Colon polyp 09/2008   tubulovillous adenoma, rpt 3-5 yrs   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 nephropathy (HCC)    DSME [facility] 01/2016    COVID-19 virus infection 11/10/2022   DKA (diabetic ketoacidoses) 04/22/2020   Frequent epistaxis 05/16/2019   S/p cauterization with resolution 2020   GERD (gastroesophageal reflux disease)    Hepatic steatosis    by abd US  05/2012, mild transaminitis - normal iron sat and viral hep panel (2011), stable US  2017   History of chicken pox    History of measles    History of recurrent UTIs    on chronic keflex    HLD (hyperlipidemia)    HTN (hypertension)    Hypertensive  retinopathy of both eyes, 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   Bulakowski   Kidney cyst, acquired 01/2016   L kidney by US    Kidney stone 01/2016   L kidney by US    Lung nodules 11/2013   overall stable on f/u CT 01/2016   Osteopenia 06/2013   mild, forearm T -1.1, hip and spine WNL   Pancreatitis    Polycythemia    mild, stable (2013)   Primary localized osteoarthritis of right  knee 01/05/2019   Rosacea    metrogel    Ulcer 2021   ulcers treated and are gone    Past Surgical History:  Procedure Laterality Date   APPENDECTOMY  1987   BILIARY STENT PLACEMENT  12/14/2019   Procedure: BILIARY STENT PLACEMENT;  Surgeon: Rollin Dover, MD;  Location: [facility] ENDOSCOPY;  Service: Endoscopy;;   BREAST BIOPSY Right 1963   benign   BREAST BIOPSY Right 08/2014   benign- core   cardiolite stress test  04/2004   normal   CESAREAN SECTION  8023;8020   x2   CHOLECYSTECTOMY  2003   COLONOSCOPY  09/26/2008   adenomatous polyp, rpt 3-5 yrs   COLONOSCOPY  08/2012   adenomatous polyps, diverticulosis, rec rpt 5 yrs Jonne)   COLONOSCOPY WITH PROPOFOL  N/A 02/05/2018   4TA, SSA, diverticulosis, rpt 3 yrs Jonne, Gladis PENNER, MD)   COLONOSCOPY WITH PROPOFOL  N/A 10/14/2022   fair prep, TAx2 one was 1cm in size, diverticulosis, rpt 1 yr given fair prep Gilberto, Ole DASEN, MD)   COSMETIC SURGERY  2022   lower  eyelids to prevent rubbing on eye surface   dexa  2003   normal   dexa  06/2013   [facility] - Tscore -1.1 forearm, normal spine and femur   ERCP N/A 12/14/2019   Procedure: ENDOSCOPIC RETROGRADE CHOLANGIOPANCREATOGRAPHY (ERCP);  Surgeon: Rollin Dover, MD;  Location: [facility] ENDOSCOPY;  Service: Endoscopy;  Laterality: N/A;   ERCP  01/2020   nonbleeding gastric ulcer - [facility] hospitalization (Dr Queenie)   ESOPHAGOGASTRODUODENOSCOPY N/A 12/19/2019   Procedure: ESOPHAGOGASTRODUODENOSCOPY (EGD);  Surgeon: Kristie Lamprey, MD;  Location: [facility] ENDOSCOPY;  Service: Endoscopy;  Laterality: N/A;   ESOPHAGOGASTRODUODENOSCOPY   01/2020   pre existing AXIOS cystogastrostomy stent s/p necrosectomy - [facility] hospitalization (Dr Queenie)   ESOPHAGOGASTRODUODENOSCOPY N/A 04/24/2020   Procedure: ESOPHAGOGASTRODUODENOSCOPY (EGD);  Surgeon: Lennard Lesta FALCON, MD;  Location: [facility] ENDOSCOPY;  Service: Endoscopy;  Laterality: N/A;   ESOPHAGOGASTRODUODENOSCOPY  05/2020   normal esophagus, duodenum, erythematous mucosa in gastric body, gastric cystogastrostomy stent removed ([facility])   IR FLUORO GUIDE CV LINE RIGHT  12/27/2019   IR FLUORO GUIDE CV LINE RIGHT  04/24/2020   IR IVC FILTER PLMT / S&I /IMG GUID/MOD SED  04/26/2020   IR IVC FILTER RETRIEVAL / S&I /IMG GUID/MOD SED  07/31/2021   IR RADIOLOGIST EVAL & MGMT  07/31/2020   IR RADIOLOGIST EVAL & MGMT  01/15/2021   IR RADIOLOGIST EVAL & MGMT  06/19/2021   IR REMOVAL TUN CV CATH W/O FL  01/03/2020   IR REMOVAL TUN CV CATH W/O FL  04/29/2020   IR REPLC DUODEN/JEJUNO TUBE PERCUT W/FLUORO  04/01/2020   IR US  GUIDE VASC ACCESS RIGHT  04/24/2020   JOINT REPLACEMENT  March, 2020   right knee joint replacement   SPHINCTEROTOMY  12/14/2019   Procedure: SPHINCTEROTOMY;  Surgeon: Rollin Dover, MD;  Location: [facility] ENDOSCOPY;  Service: Endoscopy;;   TOTAL KNEE ARTHROPLASTY Right 01/17/2019   Procedure: TOTAL KNEE ARTHROPLASTY;  Surgeon: Jane Charleston, MD;  Location: [facility] ORS;  Service: Orthopedics;  Laterality: Right;   TRANSTHORACIC ECHOCARDIOGRAM  04/2019   EF 55-60%, modLVH, impaired relaxation    TRIGGER FINGER RELEASE  2007;2010;2011   bilateral   TRIGGER FINGER RELEASE  02/2017   VAGINAL HYSTERECTOMY  1984   for menorrhagia, ovaries in place    Family History  Problem Relation Age of Onset   Stroke Mother        several   Hyperlipidemia Mother  Hypertension Mother    Arthritis Mother    Vision loss Mother    Cancer Father        colon   Hypertension Father    Hyperlipidemia Father    Coronary artery disease Father 33       MIx1, CABG   Heart disease Father    Cancer  Paternal Aunt        abdominal   Coronary artery disease Maternal Grandmother    Diabetes Maternal Grandfather    Coronary artery disease Maternal Grandfather    Heart disease Maternal Grandfather    Varicose Veins Paternal Grandmother    Anxiety disorder Son    Diabetes Maternal Uncle    Hypertension Son    Miscarriages / Stillbirths Sister    Breast cancer Neg Hx     Social History   Tobacco Use   Smoking status: Never    Passive exposure: Never   Smokeless tobacco: Never  Vaping Use   Vaping status: Never Used  Substance Use Topics   Alcohol use: Not Currently   Drug use: Never    Per HPI unless specifically indicated in ROS section below Review of Systems  Objective:  BP 130/74   Pulse 74   Temp 98.7 F (37.1 C) (Oral)   Ht 5' 0.5 (1.537 m)   Wt 176 lb (79.8 kg)   SpO2 93%   BMI 33.81 kg/m   Wt Readings from Last 3 Encounters:  05/23/24 176 lb (79.8 kg)  04/15/24 176 lb (79.8 kg)  04/04/24 177 lb 2 oz (80.3 kg)      Physical Exam Vitals and nursing note reviewed.  Constitutional:      Appearance: Normal appearance. She is not ill-appearing.  HENT:     Mouth/Throat:     Mouth: Mucous membranes are moist.     Pharynx: Oropharynx is clear. No oropharyngeal exudate or posterior oropharyngeal erythema.  Eyes:     Extraocular Movements: Extraocular movements intact.     Conjunctiva/sclera: Conjunctivae normal.     Pupils: Pupils are equal, round, and reactive to light.  Cardiovascular:     Rate and Rhythm: Normal rate and regular rhythm.     Pulses: Normal pulses.     Heart sounds: Normal heart sounds. No murmur heard. Pulmonary:     Effort: Pulmonary effort is normal. No respiratory distress.     Breath sounds: Normal breath sounds. No wheezing, rhonchi or rales.  Musculoskeletal:     Right lower leg: No edema.     Left lower leg: No edema.  Skin:    General: Skin is warm and dry.     Findings: No rash.  Neurological:     Mental Status: She is  alert.  Psychiatric:        Mood and Affect: Mood normal.        Behavior: Behavior normal.       Results for orders placed or performed in visit on 05/23/24  Renal function panel   Collection Time: 05/23/24 11:15 AM  Result Value Ref Range   Sodium 140 135 - 145 mEq/L   Potassium 4.7 3.5 - 5.1 mEq/L   Chloride 103 96 - 112 mEq/L   CO2 27 19 - 32 mEq/L   Albumin  4.9 3.5 - 5.2 g/dL   BUN 34 (H) 6 - 23 mg/dL   Creatinine, Ser 8.65 (H) 0.40 - 1.20 mg/dL   Glucose, Bld 846 (H) 70 - 99 mg/dL   Phosphorus 3.2 2.3 - 4.6  mg/dL   GFR 62.35 (L) >39.99 mL/min   Calcium  10.0 8.4 - 10.5 mg/dL  Microalbumin / creatinine urine ratio   Collection Time: 05/23/24 11:15 AM  Result Value Ref Range   Microalb, Ur 47.2 (H) 0.0 - 1.9 mg/dL   Creatinine,U 51.9 mg/dL   Microalb Creat Ratio 983.6 (H) 0.0 - 30.0 mg/g   Lab Results  Component Value Date   WBC 5.4 11/13/2023   HGB 13.8 11/13/2023   HCT 43.2 11/13/2023   MCV 83.0 11/13/2023   PLT 127.0 (L) 11/13/2023    Assessment & Plan:   Problem List Items Addressed This Visit     Type 2 diabetes mellitus with other specified complication (HCC)   Chronic, appreciate endo care. Notes improved sugar control with recent insulin  changes.       Accelerated hypertension   Chronic, great control on current regimen - continue.       GERD (gastroesophageal reflux disease)   Continue pantoprazole  40mg  daily indefinitely in h/o GIB, monitor kidney function, consider decreased dose.       CKD stage 3 due to type 2 diabetes mellitus (HCC) - Primary   Chronic kidney diease stage 3  24 hour urine protein dropped from 1.6gm to 987 mg/day.  Renal US  overall reassuring  Discussed nephrology eval if progressively deteriorating kidney function, update levels today along with Umicroalb/cr      Relevant Orders   Renal function panel (Completed)   Microalbumin / creatinine urine ratio (Completed)   Ambulatory referral to Nephrology   Persistent  proteinuria   Update Umicroalb - given persistent proteinuria, will refer to nephrology      Relevant Orders   Ambulatory referral to Nephrology   History of GI bleed   Rec stop nightly aspirin  325mg  she was taking. Rec in in its place tylenol  500-1000mg  nightly.        Meds ordered this encounter  Medications   fluticasone  (FLONASE ) 50 MCG/ACT nasal spray    Sig: Place 1 spray into both nostrils daily as needed for allergies or rhinitis.    Dispense:  16 g    Refill:  3    Orders Placed This Encounter  Procedures   Renal function panel   Microalbumin / creatinine urine ratio   Ambulatory referral to Nephrology    Referral Priority:   Routine    Referral Type:   Consultation    Referral Reason:   Specialty Services Required    Requested Specialty:   Nephrology    Number of Visits Requested:   1    Patient Instructions  You may get OTC barrier cream - check it has zinc oxide base.  Labs today, urine test today.  Try 2 tylenol  ES 500mg  at night to replace aspirin .  Continue current medicines Return as needed or in 6 months for physical/wellness visit   Follow up plan: Return in about 6 months (around 11/23/2024) for annual exam, prior fasting for blood work, medicare wellness visit.  Anton Blas, MD

## 2024-05-23 NOTE — Patient Instructions (Addendum)
 You may get OTC barrier cream - check it has zinc oxide base.  Labs today, urine test today.  Try 2 tylenol  ES 500mg  at night to replace aspirin .  Continue current medicines Return as needed or in 6 months for physical/wellness visit

## 2024-05-23 NOTE — Assessment & Plan Note (Addendum)
 Continue pantoprazole  40mg  daily indefinitely in h/o GIB, monitor kidney function, consider decreased dose.

## 2024-05-23 NOTE — Assessment & Plan Note (Signed)
Chronic, great control on current regimen - continue.

## 2024-05-23 NOTE — Assessment & Plan Note (Addendum)
 Update Umicroalb - given persistent proteinuria, will refer to nephrology

## 2024-05-30 ENCOUNTER — Ambulatory Visit: Admitting: Anesthesiology

## 2024-05-30 ENCOUNTER — Encounter
Admission: RE | Disposition: A | Discharge status: Home / Self Care | Payer: Self-pay | Source: Home / Self Care | Attending: Gastroenterology

## 2024-05-30 ENCOUNTER — Ambulatory Visit
Admission: RE | Admit: 2024-05-30 | Discharge: 2024-05-30 | Disposition: A | Discharge status: Home / Self Care | Attending: Gastroenterology | Admitting: Gastroenterology

## 2024-05-30 ENCOUNTER — Other Ambulatory Visit: Payer: Self-pay

## 2024-05-30 DIAGNOSIS — Z800 Family history of malignant neoplasm of digestive organs: Secondary | ICD-10-CM | POA: Diagnosis not present

## 2024-05-30 DIAGNOSIS — I10 Essential (primary) hypertension: Secondary | ICD-10-CM | POA: Insufficient documentation

## 2024-05-30 DIAGNOSIS — K5730 Diverticulosis of large intestine without perforation or abscess without bleeding: Secondary | ICD-10-CM | POA: Diagnosis not present

## 2024-05-30 DIAGNOSIS — Z9049 Acquired absence of other specified parts of digestive tract: Secondary | ICD-10-CM | POA: Insufficient documentation

## 2024-05-30 DIAGNOSIS — Z794 Long term (current) use of insulin: Secondary | ICD-10-CM | POA: Diagnosis not present

## 2024-05-30 DIAGNOSIS — E1121 Type 2 diabetes mellitus with diabetic nephropathy: Secondary | ICD-10-CM | POA: Insufficient documentation

## 2024-05-30 DIAGNOSIS — Z90710 Acquired absence of both cervix and uterus: Secondary | ICD-10-CM | POA: Insufficient documentation

## 2024-05-30 DIAGNOSIS — I4891 Unspecified atrial fibrillation: Secondary | ICD-10-CM | POA: Diagnosis not present

## 2024-05-30 DIAGNOSIS — E1165 Type 2 diabetes mellitus with hyperglycemia: Secondary | ICD-10-CM | POA: Insufficient documentation

## 2024-05-30 DIAGNOSIS — D123 Benign neoplasm of transverse colon: Secondary | ICD-10-CM | POA: Diagnosis not present

## 2024-05-30 DIAGNOSIS — Z79899 Other long term (current) drug therapy: Secondary | ICD-10-CM | POA: Diagnosis not present

## 2024-05-30 DIAGNOSIS — K635 Polyp of colon: Secondary | ICD-10-CM | POA: Diagnosis not present

## 2024-05-30 DIAGNOSIS — K760 Fatty (change of) liver, not elsewhere classified: Secondary | ICD-10-CM | POA: Insufficient documentation

## 2024-05-30 DIAGNOSIS — K649 Unspecified hemorrhoids: Secondary | ICD-10-CM | POA: Diagnosis not present

## 2024-05-30 DIAGNOSIS — Z1211 Encounter for screening for malignant neoplasm of colon: Secondary | ICD-10-CM | POA: Diagnosis not present

## 2024-05-30 DIAGNOSIS — Z860101 Personal history of adenomatous and serrated colon polyps: Secondary | ICD-10-CM | POA: Diagnosis not present

## 2024-05-30 HISTORY — PX: COLONOSCOPY: SHX5424

## 2024-05-30 HISTORY — PX: POLYPECTOMY: SHX149

## 2024-05-30 LAB — GLUCOSE, CAPILLARY: Glucose-Capillary: 291 mg/dL — ABNORMAL HIGH (ref 70–99)

## 2024-05-30 SURGERY — COLONOSCOPY
Anesthesia: General

## 2024-05-30 MED ORDER — SODIUM CHLORIDE 0.9 % IV SOLN: INTRAVENOUS | Status: DC

## 2024-05-30 MED ORDER — PROPOFOL 500 MG/50ML IV EMUL
INTRAVENOUS | Status: DC | PRN
  Administered 2024-05-30: 140 ug/kg/min via INTRAVENOUS

## 2024-05-30 MED ORDER — PROPOFOL 10 MG/ML IV BOLUS
INTRAVENOUS | Status: DC | PRN
  Administered 2024-05-30: 60 mg via INTRAVENOUS

## 2024-05-30 NOTE — Op Note (Signed)
 [facility] Patient Name: Katherine Oliver Procedure Date: 05/30/2024 9:05 AM MRN: [id number] Account #: [id number] Date of Birth: [birth year] Admit Type: Outpatient Age: 78 Room: [facility] ENDO ROOM 3 Gender: Female Note Status: Finalized Instrument Name: Arvis [id number] Procedure:             Colonoscopy Indications:           Surveillance: History of adenomatous polyps,                         inadequate prep on last exam (<46yr) Providers:             Ole Schick MD, MD Medicines:             Monitored Anesthesia Care Complications:         No immediate complications. Estimated blood loss:                         Minimal. Procedure:             Pre-Anesthesia Assessment:                        - Prior to the procedure, a History and Physical was                         performed, and patient medications and allergies were                         reviewed. The patient is competent. The risks and                         benefits of the procedure and the sedation options and                         risks were discussed with the patient. All questions                         were answered and informed consent was obtained.                         Patient identification and proposed procedure were                         verified by the physician, the nurse, the                         anesthesiologist, the anesthetist and the technician                         in the endoscopy suite. Mental Status Examination:                         alert and oriented. Airway Examination: normal                         oropharyngeal airway and neck mobility. Respiratory                         Examination: clear to auscultation. CV Examination:  normal. Prophylactic Antibiotics: The patient does not                         require prophylactic antibiotics. Prior                         Anticoagulants: The patient has taken no  anticoagulant                         or antiplatelet agents. ASA Grade Assessment: III - A                         patient with severe systemic disease. After reviewing                         the risks and benefits, the patient was deemed in                         satisfactory condition to undergo the procedure. The                         anesthesia plan was to use monitored anesthesia care                         (MAC). Immediately prior to administration of                         medications, the patient was re-assessed for adequacy                         to receive sedatives. The heart rate, respiratory                         rate, oxygen saturations, blood pressure, adequacy of                         pulmonary ventilation, and response to care were                         monitored throughout the procedure. The physical                         status of the patient was re-assessed after the                         procedure.                        After obtaining informed consent, the colonoscope was                         passed under direct vision. Throughout the procedure,                         the patient's blood pressure, pulse, and oxygen                         saturations were monitored continuously. The  Colonoscope was introduced through the anus and                         advanced to the the cecum, identified by appendiceal                         orifice and ileocecal valve. The colonoscopy was                         performed without difficulty. The patient tolerated                         the procedure well. The quality of the bowel                         preparation was adequate to identify polyps. The                         ileocecal valve, appendiceal orifice, and rectum were                         photographed. Findings:      The perianal and digital rectal examinations were normal.      Two sessile polyps were found in the  transverse colon. The polyps were 2       mm in size. These polyps were removed with a jumbo cold forceps.       Resection and retrieval were complete. Estimated blood loss was minimal.      Multiple small-mouthed diverticula were found in the sigmoid colon.      The exam was otherwise without abnormality on direct and retroflexion       views. Impression:            - Two 2 mm polyps in the transverse colon, removed                         with a jumbo cold forceps. Resected and retrieved.                        - Diverticulosis in the sigmoid colon.                        - The examination was otherwise normal on direct and                         retroflexion views. Recommendation:        - Discharge patient to home.                        - Resume previous diet.                        - Continue present medications.                        - Await pathology results.                        - Repeat colonoscopy is not recommended due to current  age (2 years or older) for surveillance.                        - Return to referring physician as previously                         scheduled. Procedure Code(s):     --- Professional ---                        [phone], Colonoscopy, flexible; with biopsy, single or                         multiple Diagnosis Code(s):     --- Professional ---                        Z86.010, Personal history of colonic polyps                        D12.3, Benign neoplasm of transverse colon (hepatic                         flexure or splenic flexure)                        K57.30, Diverticulosis of large intestine without                         perforation or abscess without bleeding CPT copyright 2022 American [employer] Association. All rights reserved. The codes documented in this report are preliminary and upon coder review may  be revised to meet current compliance requirements. Ole Schick MD, MD 05/30/2024 9:51:09 AM Number of  Addenda: 0 Note Initiated On: 05/30/2024 9:05 AM Scope Withdrawal Time: 0 hours 7 minutes 51 seconds  Total Procedure Duration: 0 hours 15 minutes 24 seconds  Estimated Blood Loss:  Estimated blood loss was minimal.      [facility]

## 2024-05-30 NOTE — Transfer of Care (Signed)
 Immediate Anesthesia Transfer of Care Note  Patient: Katherine Oliver  Procedure(s) Performed: COLONOSCOPY POLYPECTOMY, INTESTINE  Patient Location: PACU  Anesthesia Type:General  Level of Consciousness: awake, alert , and oriented  Airway & Oxygen Therapy: Patient Spontanous Breathing  Post-op Assessment: Report given to RN and Post -op Vital signs reviewed and stable  Post vital signs: Reviewed and stable  Last Vitals:  Vitals Value Taken Time  BP 139/71 05/30/24 09:49  Temp 35.3 C 05/30/24 09:49  Pulse 80 05/30/24 09:50  Resp 22 05/30/24 09:50  SpO2 96 % 05/30/24 09:50  Vitals shown include unfiled device data.  Last Pain:  Vitals:   05/30/24 0949  TempSrc: Oral  PainSc:          Complications: No notable events documented.

## 2024-05-30 NOTE — Anesthesia Preprocedure Evaluation (Addendum)
 Anesthesia Evaluation  Patient identified by MRN, date of birth, ID band Patient awake    Reviewed: Allergy & Precautions, NPO status , Patient's Chart, lab work & pertinent test results  History of Anesthesia Complications Negative for: history of anesthetic complications  Airway Mallampati: III  TM Distance: >3 FB Neck ROM: full    Dental  (+) Chipped   Pulmonary neg pulmonary ROS   Pulmonary exam normal        Cardiovascular hypertension, On Medications Normal cardiovascular exam+ dysrhythmias (h/o lone afib)      Neuro/Psych negative neurological ROS  negative psych ROS   GI/Hepatic PUD,GERD  ,,Metabolic dysfunction-associated fatty liver disease Pancreatic insufficiency   Endo/Other  diabetes, Poorly Controlled, Type 2    Renal/GU Renal InsufficiencyRenal disease  negative genitourinary   Musculoskeletal  (+) Arthritis , Osteoarthritis,    Abdominal Normal abdominal exam  (+)   Peds  Hematology negative hematology ROS (+)   Anesthesia Other Findings Past Medical History: No date: Allergic rhinitis No date: Arthritis 08/2014: Breast mass, right     Comment:  biopsy benign - PASH 09/2008: Colon polyp     Comment:  tubulovillous adenoma, 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 No date: 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  nephropathy (HCC)     Comment:  DSME at [facility] 01/2016  04/22/2020: DKA (diabetic ketoacidoses) 05/16/2019: Frequent epistaxis     Comment:  S/p cauterization with resolution 2020 No date: GERD (gastroesophageal reflux disease) No date: Hepatic steatosis     Comment:  by abd US  05/2012, mild transaminitis - normal iron sat               and viral hep panel (2011), stable US  2017 No date: History of chicken pox No date: History of measles No date: History of recurrent UTIs     Comment:  on chronic keflex  No date: HLD (hyperlipidemia) No date: HTN (hypertension) 06/2014: Hypertensive retinopathy of  both eyes, grade 1     Comment:  Bulakowski 01/2016: Kidney cyst, acquired     Comment:  L kidney by US  01/2016: Kidney stone     Comment:  L kidney by US  11/2013: Lung nodules     Comment:  overall stable on f/u CT 01/2016 06/2013: Osteopenia     Comment:  mild, forearm T -1.1, hip and spine WNL No date: Pancreatitis No date: Polycythemia     Comment:  mild, stable (2013) 01/05/2019: Primary localized osteoarthritis of right knee No date: Rosacea     Comment:  metrogel   Past Surgical History: 1987: APPENDECTOMY 12/14/2019: BILIARY STENT PLACEMENT     Comment:  Procedure: BILIARY STENT PLACEMENT;  Surgeon: Rollin Dover, MD;  Location: [facility] ENDOSCOPY;  Service:               Endoscopy;; 1963: BREAST BIOPSY; Right     Comment:  benign 08/2014: BREAST BIOPSY; Right     Comment:  benign- core 04/2004: cardiolite stress test     Comment:  normal 8023;8020: CESAREAN SECTION     Comment:  x2 2003: CHOLECYSTECTOMY 09/26/2008: COLONOSCOPY     Comment:  adenomatous polyp, rpt 3-5 yrs 08/2012: COLONOSCOPY     Comment:  adenomatous polyps, diverticulosis, rec rpt 5 yrs               Jonne) 02/05/2018: COLONOSCOPY WITH PROPOFOL ; N/A     Comment:  4TA, SSA, diverticulosis, rpt 3 yrs Jonne,  Gladis PENNER,              MD) 2003: dexa     Comment:  normal 06/2013: dexa     Comment:  [facility] - Tscore -1.1 forearm, normal spine and femur 12/14/2019: ERCP; N/A     Comment:  Procedure: ENDOSCOPIC RETROGRADE               CHOLANGIOPANCREATOGRAPHY (ERCP);  Surgeon: Rollin Dover,              MD;  Location: [facility] ENDOSCOPY;  Service: Endoscopy;                Laterality: N/A; 01/2020: ERCP     Comment:  nonbleeding gastric ulcer - [facility] hospitalization (Dr               Queenie) 12/19/2019: ESOPHAGOGASTRODUODENOSCOPY; N/A     Comment:  Procedure: ESOPHAGOGASTRODUODENOSCOPY (EGD);  Surgeon:               Kristie Lamprey, MD;  Location: [facility] ENDOSCOPY;  Service:               Endoscopy;   Laterality: N/A; 01/2020: ESOPHAGOGASTRODUODENOSCOPY     Comment:  pre existing AXIOS cystogastrostomy stent s/p               necrosectomy - [facility] hospitalization (Dr Queenie) 04/24/2020: ESOPHAGOGASTRODUODENOSCOPY; N/A     Comment:  Procedure: ESOPHAGOGASTRODUODENOSCOPY (EGD);  Surgeon:               Lennard Lesta FALCON, MD;  Location: [facility] ENDOSCOPY;  Service:               Endoscopy;  Laterality: N/A; 05/2020: ESOPHAGOGASTRODUODENOSCOPY     Comment:  normal esophagus, duodenum, erythematous mucosa in               gastric body, gastric cystogastrostomy stent removed               ([facility]) 12/27/2019: IR FLUORO GUIDE CV LINE RIGHT 04/24/2020: IR FLUORO GUIDE CV LINE RIGHT 04/26/2020: IR IVC FILTER PLMT / S&I PORTER GUID/MOD SED 07/31/2021: IR IVC FILTER RETRIEVAL / S&I PORTER GUID/MOD SED 07/31/2020: IR RADIOLOGIST EVAL & MGMT 01/15/2021: IR RADIOLOGIST EVAL & MGMT 06/19/2021: IR RADIOLOGIST EVAL & MGMT 01/03/2020: IR REMOVAL TUN CV CATH W/O FL 04/29/2020: IR REMOVAL TUN CV CATH W/O FL 04/01/2020: IR REPLC DUODEN/JEJUNO TUBE PERCUT W/FLUORO 04/24/2020: IR US  GUIDE VASC ACCESS RIGHT 12/14/2019: SPHINCTEROTOMY     Comment:  Procedure: SPHINCTEROTOMY;  Surgeon: Rollin Dover, MD;               Location: [facility] ENDOSCOPY;  Service: Endoscopy;; 01/17/2019: TOTAL KNEE ARTHROPLASTY; Right     Comment:  Procedure: TOTAL KNEE ARTHROPLASTY;  Surgeon: Jane Charleston, MD;  Location: [facility] ORS;  Service: Orthopedics;                Laterality: Right; 04/2019: TRANSTHORACIC ECHOCARDIOGRAM     Comment:  EF 55-60%, modLVH, impaired relaxation  2007;2010;2011: TRIGGER FINGER RELEASE     Comment:  bilateral 02/2017: TRIGGER FINGER RELEASE 1984: VAGINAL HYSTERECTOMY     Comment:  for menorrhagia, ovaries in place     Reproductive/Obstetrics negative OB ROS                              Anesthesia Physical Anesthesia Plan  ASA: 3  Anesthesia Plan: General   Post-op Pain  Management: Minimal or no pain anticipated   Induction: Intravenous  PONV Risk Score and Plan: Propofol  infusion and TIVA  Airway Management Planned: Natural Airway and Nasal Cannula  Additional Equipment:   Intra-op Plan:   Post-operative Plan:   Informed Consent: I have reviewed the patients History and Physical, chart, labs and discussed the procedure including the risks, benefits and alternatives for the proposed anesthesia with the patient or authorized representative who has indicated his/her understanding and acceptance.     Dental Advisory Given  Plan Discussed with: Anesthesiologist, CRNA and Surgeon  Anesthesia Plan Comments:         Anesthesia Quick Evaluation

## 2024-05-30 NOTE — Interval H&P Note (Signed)
 History and Physical Interval Note:  05/30/2024 8:34 AM  Katherine Oliver  has presented today for surgery, with the diagnosis of History of adenomatous and serrated colon polyps (Z86.0101).  The various methods of treatment have been discussed with the patient and family. After consideration of risks, benefits and other options for treatment, the patient has consented to  Procedure(s): COLONOSCOPY (N/A) as a surgical intervention.  The patient's history has been reviewed, patient examined, no change in status, stable for surgery.  I have reviewed the patient's chart and labs.  Questions were answered to the patient's satisfaction.     Ole ONEIDA Schick  Ok to proceed with colonoscopy

## 2024-05-30 NOTE — Anesthesia Postprocedure Evaluation (Signed)
 Anesthesia Post Note  Patient: Katherine Oliver  Procedure(s) Performed: COLONOSCOPY POLYPECTOMY, INTESTINE  Patient location during evaluation: Endoscopy Anesthesia Type: General Level of consciousness: awake and alert Pain management: pain level controlled Vital Signs Assessment: post-procedure vital signs reviewed and stable Respiratory status: spontaneous breathing, nonlabored ventilation and respiratory function stable Cardiovascular status: blood pressure returned to baseline and stable Postop Assessment: no apparent nausea or vomiting Anesthetic complications: no   No notable events documented.   Last Vitals:  Vitals:   05/30/24 1009 05/30/24 1010  BP: (!) 155/78 (!) 155/78  Pulse: 70 72  Resp: (!) 21 (!) 21  Temp:    SpO2: 99% 98%    Last Pain:  Vitals:   05/30/24 0949  TempSrc: Oral  PainSc:                  Camellia Merilee Louder

## 2024-05-30 NOTE — H&P (Signed)
 Outpatient short stay form Pre-procedure 05/30/2024  Katherine ONEIDA Schick, MD  Primary Physician: Rilla Baller, MD  Reason for visit:  Surveillance  History of present illness:    78 y/o lady with history of hypertension, DM II, and HLD here for surveillance colonoscopy. Had large TA on last colonoscopy a little over 1 year ago but prep was only fair. Had complicated course due to pancreatitis in 2021. No blood thinners. History of appendectomy, cholecystectomy, and hysterectomy. Father had colon cancer in his 62's.     Current Facility-Administered Medications:    0.9 %  sodium chloride  infusion, , Intravenous, Continuous, Tomica Arseneault, Katherine ONEIDA, MD, Last Rate: 20 mL/hr at 05/30/24 0819, New Bag at 05/30/24 0819  Medications Prior to Admission  Medication Sig Dispense Refill Last Dose/Taking   ACCU-CHEK AVIVA PLUS test strip 1 EACH BY OTHER ROUTE 3 (THREE) TIMES DAILY. AND LANCETS 4/DAY 300 strip 3 05/29/2024   acetaminophen  (TYLENOL ) 500 MG tablet Take 500 mg by mouth 2 (two) times daily.   05/29/2024   amLODipine  (NORVASC ) 10 MG tablet Take 1 tablet (10 mg total) by mouth daily. 90 tablet 3 05/29/2024   Baclofen  5 MG TABS Take 1 tablet (5 mg total) by mouth at bedtime as needed. 10 tablet 0 05/29/2024   benazepril -hydrochlorthiazide (LOTENSIN  HCT) 20-12.5 MG tablet TAKE 1 TABLET BY MOUTH DAILY 90 tablet 3 05/29/2024   Cholecalciferol (VITAMIN D3) 25 MCG (1000 UT) CAPS Take 2 capsules (2,000 Units total) by mouth daily.   05/29/2024   Continuous Glucose Sensor (DEXCOM G7 SENSOR) MISC Use on skin q10 days to check sugars E11.65, Z79.4 9 each 3 05/29/2024   CRANBERRY PO Take 4,200 mg by mouth 2 (two) times daily.   05/29/2024   CREON  36000-114000 units CPEP capsule Take 2 capsules (72,000 Units total) by mouth 3 (three) times daily before meals. 600 capsule 3 05/29/2024   D-MANNOSE PO Take 2 tablets by mouth daily with lunch.   05/29/2024   estradiol  (ESTRACE ) 0.1 MG/GM vaginal cream (DISCARD  APPLICATOR) APPLY PEA SIZED AMOUNT TO TIP OF FINGER TO URETHRA BEFORE BED. WASH HANDS WELL AFTER APPLICATION. USE MONDAY, [facility] AND FRIDAY 42.5 g 11 05/29/2024   ferrous sulfate  325 (65 FE) MG tablet Take 1 tablet (325 mg total) by mouth every other day.   05/29/2024   fluticasone  (FLONASE ) 50 MCG/ACT nasal spray Place 1 spray into both nostrils daily as needed for allergies or rhinitis. 16 g 3 05/29/2024   folic acid  (FOLVITE ) 1 MG tablet TAKE 1 TABLET BY MOUTH DAILY 90 tablet 3 05/29/2024   Glucagon  (BAQSIMI  ONE PACK) 3 MG/DOSE POWD Place 3 mg into the nose daily as needed (hypoglycemia).   05/29/2024   insulin  glargine (LANTUS ) 100 UNIT/ML Solostar Pen Inject 30 Units into the skin at bedtime. 30 mL 3 05/29/2024   insulin  lispro (HUMALOG  KWIKPEN) 200 UNIT/ML KwikPen Max daily 100 units 45 mL 3 05/29/2024   Insulin  Pen Needle (B-D UF III MINI PEN NEEDLES) 31G X 5 MM MISC Inject 1 Device into the skin 4 (four) times daily. 400 each 3 05/29/2024   loperamide  (IMODIUM  A-D) 2 MG tablet Take 4 mg by mouth 4 (four) times daily as needed for diarrhea or loose stools.   05/29/2024   Magnesium  400 MG TABS Take 1 tablet by mouth daily.   05/29/2024   meclizine  (ANTIVERT ) 25 MG tablet Take 1 tablet (25 mg total) by mouth 3 (three) times daily as needed for nausea. 30 tablet 0 05/30/2024 at  5:00 AM   metoprolol  succinate (TOPROL -XL) 50 MG 24 hr tablet TAKE 1 TABLET BY MOUTH DAILY  WITH OR IMMEDIATELY FOLLOWING A  MEAL 90 tablet 4 05/30/2024 at  5:00 AM   metroNIDAZOLE  (METROCREAM ) 0.75 % cream Apply twice daily to face as needed for rosacea 45 g 2 05/29/2024   Multiple Vitamin (MULTIVITAMIN WITH MINERALS) TABS tablet Take 1 tablet by mouth daily.   05/29/2024   pantoprazole  (PROTONIX ) 40 MG tablet Take 1 tablet (40 mg total) by mouth every evening. 90 tablet 4 05/29/2024   rosuvastatin  (CRESTOR ) 10 MG tablet Take 1 tablet (10 mg total) by mouth daily. 90 tablet 4 05/29/2024   triamcinolone  (KENALOG ) 0.1 % paste APPLY  SPARINGLY 2-3 TIMES DAILY AS DIRECTED 5 g 1 05/29/2024     Allergies  Allergen Reactions   Azithromycin  Itching    Okay if takes benadryl  along with it   Nickel     Reaction to cheap earrings cause redness   Sulfa Antibiotics Itching   Vinegar [Acetic Acid] Nausea Only   Adhesive [Tape] Rash    Paper tape only causes blisters   Keflex  [Cephalexin ] Rash     Past Medical History:  Diagnosis Date   Allergic rhinitis    Allergy 1967   Anemia 2021   Arthritis 2010   Blood transfusion without reported diagnosis 2021   several in 2021   Breast mass, right 08/2014   biopsy benign - PASH   Cataract 2015   Clotting disorder (HCC) 2019   nosebleeds, cauterized 2020   Colon polyp 09/2008   tubulovillous adenoma, 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 nephropathy (HCC)    DSME at [facility] 01/2016    COVID-19 virus infection 11/10/2022   DKA (diabetic ketoacidoses) 04/22/2020   Frequent epistaxis 05/16/2019   S/p cauterization with resolution 2020   GERD (gastroesophageal reflux disease)    Hepatic steatosis    by abd US  05/2012, mild transaminitis - normal iron sat and viral hep panel (2011), stable US  2017   History of chicken pox    History of measles    History of recurrent UTIs    on chronic keflex    HLD (hyperlipidemia)    HTN (hypertension)    Hypertensive retinopathy of both eyes, 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   Bulakowski   Kidney cyst, acquired 01/2016   L kidney by US    Kidney stone 01/2016   L kidney by US    Lung nodules 11/2013   overall stable on f/u CT 01/2016   Osteopenia 06/2013   mild, forearm T -1.1, hip and spine WNL   Pancreatitis    Polycythemia    mild, stable (2013)   Primary localized osteoarthritis of right knee 01/05/2019   Rosacea    metrogel    Ulcer 2021   ulcers treated and are gone    Review of systems:  Otherwise negative.    Physical Exam  Gen: Alert, oriented. Appears stated age.  HEENT: PERRLA. Lungs: No respiratory  distress CV: RRR Abd: soft, benign, no masses Ext: No edema    Planned procedures: Proceed with colonoscopy. The patient understands the nature of the planned procedure, indications, risks, alternatives and potential complications including but not limited to bleeding, infection, perforation, damage to internal organs and possible oversedation/side effects from anesthesia. The patient agrees and gives consent to proceed.  Please refer to procedure notes for findings, recommendations and patient disposition/instructions.     Katherine ONEIDA Schick, MD [facility]

## 2024-05-31 ENCOUNTER — Encounter: Payer: Self-pay | Admitting: Gastroenterology

## 2024-06-01 LAB — SURGICAL PATHOLOGY

## 2024-06-04 ENCOUNTER — Encounter: Payer: Self-pay | Admitting: Family Medicine

## 2024-06-22 DIAGNOSIS — E785 Hyperlipidemia, unspecified: Secondary | ICD-10-CM | POA: Diagnosis not present

## 2024-06-22 DIAGNOSIS — I129 Hypertensive chronic kidney disease with stage 1 through stage 4 chronic kidney disease, or unspecified chronic kidney disease: Secondary | ICD-10-CM | POA: Diagnosis not present

## 2024-06-22 DIAGNOSIS — N1832 Chronic kidney disease, stage 3b: Secondary | ICD-10-CM | POA: Diagnosis not present

## 2024-06-22 DIAGNOSIS — R809 Proteinuria, unspecified: Secondary | ICD-10-CM | POA: Diagnosis not present

## 2024-06-22 DIAGNOSIS — E1122 Type 2 diabetes mellitus with diabetic chronic kidney disease: Secondary | ICD-10-CM | POA: Diagnosis not present

## 2024-07-21 DIAGNOSIS — E1122 Type 2 diabetes mellitus with diabetic chronic kidney disease: Secondary | ICD-10-CM | POA: Diagnosis not present

## 2024-07-21 DIAGNOSIS — N1832 Chronic kidney disease, stage 3b: Secondary | ICD-10-CM | POA: Diagnosis not present

## 2024-07-21 DIAGNOSIS — R809 Proteinuria, unspecified: Secondary | ICD-10-CM | POA: Diagnosis not present

## 2024-07-21 DIAGNOSIS — I129 Hypertensive chronic kidney disease with stage 1 through stage 4 chronic kidney disease, or unspecified chronic kidney disease: Secondary | ICD-10-CM | POA: Diagnosis not present

## 2024-07-21 DIAGNOSIS — E785 Hyperlipidemia, unspecified: Secondary | ICD-10-CM | POA: Diagnosis not present

## 2024-08-09 ENCOUNTER — Encounter: Payer: Self-pay | Admitting: Family Medicine

## 2024-08-09 ENCOUNTER — Other Ambulatory Visit: Payer: Self-pay

## 2024-08-09 ENCOUNTER — Ambulatory Visit (INDEPENDENT_AMBULATORY_CARE_PROVIDER_SITE_OTHER): Admitting: Family Medicine

## 2024-08-09 VITALS — BP 136/70 | HR 80 | Temp 97.9°F | Ht 61.0 in | Wt 176.5 lb

## 2024-08-09 DIAGNOSIS — Z794 Long term (current) use of insulin: Secondary | ICD-10-CM

## 2024-08-09 DIAGNOSIS — I10 Essential (primary) hypertension: Secondary | ICD-10-CM | POA: Diagnosis not present

## 2024-08-09 DIAGNOSIS — E1122 Type 2 diabetes mellitus with diabetic chronic kidney disease: Secondary | ICD-10-CM | POA: Diagnosis not present

## 2024-08-09 DIAGNOSIS — J302 Other seasonal allergic rhinitis: Secondary | ICD-10-CM

## 2024-08-09 DIAGNOSIS — J029 Acute pharyngitis, unspecified: Secondary | ICD-10-CM | POA: Diagnosis not present

## 2024-08-09 DIAGNOSIS — J069 Acute upper respiratory infection, unspecified: Secondary | ICD-10-CM | POA: Diagnosis not present

## 2024-08-09 DIAGNOSIS — E1165 Type 2 diabetes mellitus with hyperglycemia: Secondary | ICD-10-CM

## 2024-08-09 DIAGNOSIS — Z23 Encounter for immunization: Secondary | ICD-10-CM | POA: Diagnosis not present

## 2024-08-09 DIAGNOSIS — N183 Chronic kidney disease, stage 3 unspecified: Secondary | ICD-10-CM | POA: Diagnosis not present

## 2024-08-09 MED ORDER — AREXVY 120 MCG/0.5ML IM SUSR
INTRAMUSCULAR | 0 refills | Status: AC
  Filled 2024-08-09: qty 0.5, 1d supply, fill #0

## 2024-08-09 NOTE — Patient Instructions (Addendum)
 Flu shot today Get RSV shot today from [facility] pharmacist.  Good to see you today.  Happy early birthday!   VISIT SUMMARY: Today, you were seen for a sore throat, earache, and fatigue that have been bothering you for the past four days. You also discussed your ongoing management of diabetes, pancreatic insufficiency, and chronic kidney disease. We reviewed your symptoms and provided recommendations for managing your current illness and allergies.  YOUR PLAN: -VIRAL UPPER RESPIRATORY INFECTION VS ALLERGIC RHINITIS: Your symptoms suggest either a viral upper respiratory infection or allergic rhinitis. A viral upper respiratory infection is a common cold, while allergic rhinitis is an allergic reaction causing nasal symptoms. Your COVID-19 test was negative, and there are no signs of a bacterial infection. We recommend taking Xyzal or Zyrtec for your allergies, continuing Flonase  for nasal symptoms, increasing your fluid intake, and getting plenty of rest. You can take Tylenol  to help relieve your symptoms. Please monitor for a fever over 101F or worsening symptoms, which could indicate a bacterial infection.  -CHRONIC KIDNEY DISEASE, STAGE 3: Chronic kidney disease (CKD) is a condition where the kidneys gradually lose function. You are at stage 3, meaning your kidney function is moderately reduced. You are now taking spironolactone 25 mg daily as advised by your nephrologist. Your kidney function is currently around 30%.  -TYPE 2 DIABETES MELLITUS: Type 2 diabetes is a condition where your body does not use insulin  properly, leading to high blood sugar levels. You are managing this with Lantus  and Humalog .  -GENERAL HEALTH MAINTENANCE: We discussed your immunizations and noted that you are up to date on your tetanus shot. Your last pneumonia vaccine was ten years ago, so we may recommend getting an updated one at next visit. If you are feeling well, you can get a flu shot. Additionally, you can get the  RSV vaccine from our [facility] pharmacist today.  INSTRUCTIONS: Please follow the recommendations provided for managing your symptoms and allergies. Monitor your symptoms and seek medical attention if you develop a fever over 101F or if your symptoms worsen.

## 2024-08-09 NOTE — Assessment & Plan Note (Addendum)
 Chronic, stable period on current regimen - of amlodipine , benazepril , spironolactone and Toprol  XL.

## 2024-08-09 NOTE — Progress Notes (Signed)
 Ph: [phone] Fax: [phone]   Patient ID: Katherine Oliver, female    DOB: 1946/08/15, 78 y.o.   MRN: [id number]  This visit was conducted in person.  BP 136/70   Pulse 80   Temp 97.9 F (36.6 C) (Oral)   Ht 5' 1 (1.549 m)   Wt 176 lb 8 oz (80.1 kg)   SpO2 95%   BMI 33.35 kg/m    Chief Complaint  Patient presents with   Medical Management of Chronic Issues    Pt C/o sore throat x 4 days Left Ear pain x 2 days. Fatigue     Subjective:   Discussed the use of AI scribe software for clinical note transcription with the patient, who gave verbal consent to proceed.  History of Present Illness   Katherine Oliver is a 78 year old female with diabetes, chronic kidney disease and pancreatic insufficiency who presents with sore throat, earache, and fatigue.  She has had a sore throat for four days, starting on Saturday, with subsequent left ear pain developing the next day. Her throat improved after taking two Zycam gummies yesterday. She denies fever, hearing changes, ear drainage, vertigo, nausea, tooth pain, headaches, or sinus pressure pain. She experiences occasional coughing, particularly at night.   She reports increased fatigue over the past four days, which is unusual for her. She attributes this to possibly fighting off this illness or possibly her allergies. She is allergic to ragweed and was recently exposed to a hospital waiting room environment, which she associates with the onset of her fatigue.  Allergy symptoms include a runny nose, especially in the morning, and itchy eyes by the evening. She denies significant nasal congestion. She uses Flonase  for her allergies and has not been taking antihistamines due to past ineffectiveness to allegra and claritin.  Her medical history includes diabetes managed with Lantus  and Humalog , and pancreatic insufficiency managed with Creon . She has chronic kidney disease with significant proteinuria and is under  nephrology care. Her medication regimen was recently adjusted to include spironolactone 25 mg once daily, replacing hydrochlorothiazide . She continues taking benazepril .          Relevant past medical, surgical, family and social history reviewed and updated as indicated. Interim medical history since our last visit reviewed. Allergies and medications reviewed and updated. Outpatient Medications Prior to Visit  Medication Sig Dispense Refill   benazepril  (LOTENSIN ) 20 MG tablet Take 1 tablet (20 mg total) by mouth daily.     spironolactone (ALDACTONE) 25 MG tablet Take 25 mg by mouth.     ACCU-CHEK AVIVA PLUS test strip 1 EACH BY OTHER ROUTE 3 (THREE) TIMES DAILY. AND LANCETS 4/DAY 300 strip 3   acetaminophen  (TYLENOL ) 500 MG tablet Take 500 mg by mouth 2 (two) times daily.     amLODipine  (NORVASC ) 10 MG tablet Take 1 tablet (10 mg total) by mouth daily. 90 tablet 3   Baclofen  5 MG TABS Take 1 tablet (5 mg total) by mouth at bedtime as needed. 10 tablet 0   Cholecalciferol (VITAMIN D3) 25 MCG (1000 UT) CAPS Take 2 capsules (2,000 Units total) by mouth daily.     Continuous Glucose Sensor (DEXCOM G7 SENSOR) MISC Use on skin q10 days to check sugars E11.65, Z79.4 9 each 3   CRANBERRY PO Take 4,200 mg by mouth 2 (two) times daily.     CREON  36000-114000 units CPEP capsule Take 2 capsules (72,000 Units total) by mouth 3 (three) times daily before meals.  600 capsule 3   D-MANNOSE PO Take 2 tablets by mouth daily with lunch.     estradiol  (ESTRACE ) 0.1 MG/GM vaginal cream (DISCARD APPLICATOR) APPLY PEA SIZED AMOUNT TO TIP OF FINGER TO URETHRA BEFORE BED. WASH HANDS WELL AFTER APPLICATION. USE MONDAY, WEDNESDAY AND FRIDAY 42.5 g 11   ferrous sulfate  325 (65 FE) MG tablet Take 1 tablet (325 mg total) by mouth every other day.     fluticasone  (FLONASE ) 50 MCG/ACT nasal spray Place 1 spray into both nostrils daily as needed for allergies or rhinitis. 16 g 3   folic acid  (FOLVITE ) 1 MG tablet TAKE 1  TABLET BY MOUTH DAILY 90 tablet 3   Glucagon  (BAQSIMI  ONE PACK) 3 MG/DOSE POWD Place 3 mg into the nose daily as needed (hypoglycemia).     insulin  glargine (LANTUS ) 100 UNIT/ML Solostar Pen Inject 30 Units into the skin at bedtime. 30 mL 3   insulin  lispro (HUMALOG  KWIKPEN) 200 UNIT/ML KwikPen Max daily 100 units 45 mL 3   Insulin  Pen Needle (B-D UF III MINI PEN NEEDLES) 31G X 5 MM MISC Inject 1 Device into the skin 4 (four) times daily. 400 each 3   loperamide  (IMODIUM  A-D) 2 MG tablet Take 4 mg by mouth 4 (four) times daily as needed for diarrhea or loose stools.     Magnesium  400 MG TABS Take 1 tablet by mouth daily.     meclizine  (ANTIVERT ) 25 MG tablet Take 1 tablet (25 mg total) by mouth 3 (three) times daily as needed for nausea. 30 tablet 0   metoprolol  succinate (TOPROL -XL) 50 MG 24 hr tablet TAKE 1 TABLET BY MOUTH DAILY  WITH OR IMMEDIATELY FOLLOWING A  MEAL 90 tablet 4   metroNIDAZOLE  (METROCREAM ) 0.75 % cream Apply twice daily to face as needed for rosacea 45 g 2   Multiple Vitamin (MULTIVITAMIN WITH MINERALS) TABS tablet Take 1 tablet by mouth daily.     pantoprazole  (PROTONIX ) 40 MG tablet Take 1 tablet (40 mg total) by mouth every evening. 90 tablet 4   rosuvastatin  (CRESTOR ) 10 MG tablet Take 1 tablet (10 mg total) by mouth daily. 90 tablet 4   spironolactone (ALDACTONE) 25 MG tablet Take 1 tablet (25 mg total) by mouth daily.     triamcinolone  (KENALOG ) 0.1 % paste APPLY SPARINGLY 2-3 TIMES DAILY AS DIRECTED 5 g 1   benazepril -hydrochlorthiazide (LOTENSIN  HCT) 20-12.5 MG tablet TAKE 1 TABLET BY MOUTH DAILY 90 tablet 3   No facility-administered medications prior to visit.     Per HPI unless specifically indicated in ROS section below Review of Systems  Objective:  BP 136/70   Pulse 80   Temp 97.9 F (36.6 C) (Oral)   Ht 5' 1 (1.549 m)   Wt 176 lb 8 oz (80.1 kg)   SpO2 95%   BMI 33.35 kg/m   Wt Readings from Last 3 Encounters:  08/09/24 176 lb 8 oz (80.1 kg)   05/30/24 175 lb (79.4 kg)  05/23/24 176 lb (79.8 kg)      Physical Exam   HEENT: Throat not red or irritated. Right ear without infection. Sinuses slightly irritated bilaterally.       Physical Exam Vitals and nursing note reviewed.  Constitutional:      Appearance: Normal appearance. She is not ill-appearing.  HENT:     Head: Normocephalic and atraumatic.     Right Ear: Tympanic membrane, ear canal and external ear normal. There is no impacted cerumen.  Left Ear: Tympanic membrane, ear canal and external ear normal. There is no impacted cerumen.     Nose: Congestion present. No rhinorrhea.     Right Turbinates: Swollen. Not enlarged or pale.     Left Turbinates: Swollen. Not enlarged or pale.     Right Sinus: No maxillary sinus tenderness or frontal sinus tenderness.     Left Sinus: No maxillary sinus tenderness or frontal sinus tenderness.     Comments: Nasal mucosal erythema/injection     Mouth/Throat:     Mouth: Mucous membranes are moist.     Pharynx: Oropharynx is clear. No oropharyngeal exudate or posterior oropharyngeal erythema.  Eyes:     Extraocular Movements: Extraocular movements intact.     Conjunctiva/sclera: Conjunctivae normal.     Pupils: Pupils are equal, round, and reactive to light.  Cardiovascular:     Rate and Rhythm: Normal rate and regular rhythm.     Pulses: Normal pulses.     Heart sounds: Normal heart sounds. No murmur heard. Pulmonary:     Effort: Pulmonary effort is normal. No respiratory distress.     Breath sounds: Normal breath sounds. No wheezing, rhonchi or rales.  Lymphadenopathy:     Head:     Right side of head: No submental, submandibular, tonsillar, preauricular or posterior auricular adenopathy.     Left side of head: No submental, submandibular, tonsillar, preauricular or posterior auricular adenopathy.     Cervical: No cervical adenopathy.     Right cervical: No superficial cervical adenopathy.    Left cervical: No superficial  cervical adenopathy.     Upper Body:     Right upper body: No supraclavicular adenopathy.     Left upper body: No supraclavicular adenopathy.  Skin:    Findings: No rash.  Neurological:     Mental Status: She is alert.  Psychiatric:        Mood and Affect: Mood normal.        Behavior: Behavior normal.       Results   LABS COVID-19 PCR test: Negative (08/09/2024) Renal function: 30% function Proteinuria: 900 mg/24 hours       Lab Results  Component Value Date   HGBA1C 7.3 (A) 04/15/2024    Lab Results  Component Value Date   NA 140 05/23/2024   CL 103 05/23/2024   K 4.7 05/23/2024   CO2 27 05/23/2024   BUN 34 (H) 05/23/2024   CREATININE 1.34 (H) 05/23/2024   GFR 37.64 (L) 05/23/2024   CALCIUM  10.0 05/23/2024   PHOS 3.2 05/23/2024   ALBUMIN  4.9 05/23/2024   GLUCOSE 153 (H) 05/23/2024    Lab Results  Component Value Date   WBC 5.4 11/13/2023   HGB 13.8 11/13/2023   HCT 43.2 11/13/2023   MCV 83.0 11/13/2023   PLT 127.0 (L) 11/13/2023    Assessment & Plan:  She request flu shot today - reasonable to get as she's staritng to feel better today.     Viral upper respiratory infection vs allergic rhinitis Symptoms suggest allergic rhinitis with possible viral infection. COVID-19 test negative. No fever or significant cough, reducing likelihood of bacterial infection. - Recommend Xyzal or Zyrtec for allergy management. - Continue Flonase  for nasal symptoms. - Encourage increased fluid intake and rest. - Advise Tylenol  for symptom relief. - Monitor for fever over 101F or worsening symptoms for possible bacterial infection.  Chronic kidney disease, stage 3 Stage 3 CKD with significant proteinuria. Switched to spironolactone 25 mg daily by nephrology. Kidney  function at approximately 30%.  Type 2 diabetes mellitus Managed with Lantus  and Humalog , followed by endocrinology.  General Health Maintenance Discussed immunizations. Up to date on tetanus. Last pneumonia  vaccine ten years ago, new one will be given at future date. - Administer flu shot if feeling well. - Administer RSV vaccine with [facility] pharmacist in office today. - Consider updating pneumonia vaccine.       Problem List Items Addressed This Visit     Accelerated hypertension   Chronic, stable period on current regimen - of amlodipine , benazepril , spironolactone and Toprol  XL.       Relevant Medications   benazepril  (LOTENSIN ) 20 MG tablet   spironolactone (ALDACTONE) 25 MG tablet   Allergic rhinitis   Possible exacerbation in fall ragweed season.  Continue flonase , restart non-sedating 2nd generation antihistamine.       CKD stage 3 due to type 2 diabetes mellitus (HCC)   Chronic kidney disease with proteinuria, now established with nephrology, appreciate Dr [facility] care.       Relevant Medications   benazepril  (LOTENSIN ) 20 MG tablet   Type 2 diabetes mellitus with hyperglycemia, with long-term current use of insulin  (HCC)   Follows with endocrinology [facility]) on basal and regular insulin        Relevant Medications   benazepril  (LOTENSIN ) 20 MG tablet   Acute URI - Primary   COVID swab negative today. Anticipate viral upper respiratory infection given short duration and improvement in symptoms, vs allergic rhinitis exacerbation due to fall weather - discussed restarting antihistamine use. Supportive measures reviewed. Reviewed red flags to update us  for possible abx course.       Other Visit Diagnoses       Sore throat         Encounter for immunization       Relevant Orders   Flu vaccine HIGH DOSE PF(Fluzone  Trivalent) (Completed)        No orders of the defined types were placed in this encounter.   Orders Placed This Encounter  Procedures   Flu vaccine HIGH DOSE PF(Fluzone  Trivalent)    Patient Instructions  Flu shot today Get RSV shot today from [facility] pharmacist.  Good to see you today.  Happy early birthday!   VISIT SUMMARY: Today, you were  seen for a sore throat, earache, and fatigue that have been bothering you for the past four days. You also discussed your ongoing management of diabetes, pancreatic insufficiency, and chronic kidney disease. We reviewed your symptoms and provided recommendations for managing your current illness and allergies.  YOUR PLAN: -VIRAL UPPER RESPIRATORY INFECTION VS ALLERGIC RHINITIS: Your symptoms suggest either a viral upper respiratory infection or allergic rhinitis. A viral upper respiratory infection is a common cold, while allergic rhinitis is an allergic reaction causing nasal symptoms. Your COVID-19 test was negative, and there are no signs of a bacterial infection. We recommend taking Xyzal or Zyrtec for your allergies, continuing Flonase  for nasal symptoms, increasing your fluid intake, and getting plenty of rest. You can take Tylenol  to help relieve your symptoms. Please monitor for a fever over 101F or worsening symptoms, which could indicate a bacterial infection.  -CHRONIC KIDNEY DISEASE, STAGE 3: Chronic kidney disease (CKD) is a condition where the kidneys gradually lose function. You are at stage 3, meaning your kidney function is moderately reduced. You are now taking spironolactone 25 mg daily as advised by your nephrologist. Your kidney function is currently around 30%.  -TYPE 2 DIABETES MELLITUS: Type 2 diabetes is  a condition where your body does not use insulin  properly, leading to high blood sugar levels. You are managing this with Lantus  and Humalog .  -GENERAL HEALTH MAINTENANCE: We discussed your immunizations and noted that you are up to date on your tetanus shot. Your last pneumonia vaccine was ten years ago, so we may recommend getting an updated one at next visit. If you are feeling well, you can get a flu shot. Additionally, you can get the RSV vaccine from our [facility] pharmacist today.  INSTRUCTIONS: Please follow the recommendations provided for managing your symptoms and  allergies. Monitor your symptoms and seek medical attention if you develop a fever over 101F or if your symptoms worsen.   Follow up plan: Return if symptoms worsen or fail to improve.  Anton Blas, MD  .

## 2024-08-09 NOTE — Assessment & Plan Note (Signed)
 Chronic kidney disease with proteinuria, now established with nephrology, appreciate Dr [facility] care.

## 2024-08-09 NOTE — Assessment & Plan Note (Signed)
 Possible exacerbation in fall ragweed season.  Continue flonase , restart non-sedating 2nd generation antihistamine.

## 2024-08-09 NOTE — Assessment & Plan Note (Signed)
 Follows with endocrinology [name]) on basal and regular insulin 

## 2024-08-09 NOTE — Assessment & Plan Note (Addendum)
 COVID swab negative today. Anticipate viral upper respiratory infection given short duration and improvement in symptoms, vs allergic rhinitis exacerbation due to fall weather - discussed restarting antihistamine use. Supportive measures reviewed. Reviewed red flags to update us  for possible abx course.

## 2024-08-11 ENCOUNTER — Other Ambulatory Visit: Payer: Self-pay

## 2024-08-15 ENCOUNTER — Other Ambulatory Visit: Payer: Self-pay | Admitting: Internal Medicine

## 2024-08-19 ENCOUNTER — Encounter: Payer: Self-pay | Admitting: Family Medicine

## 2024-08-24 ENCOUNTER — Other Ambulatory Visit: Payer: Self-pay | Admitting: Family Medicine

## 2024-08-24 DIAGNOSIS — Z1231 Encounter for screening mammogram for malignant neoplasm of breast: Secondary | ICD-10-CM

## 2024-09-19 ENCOUNTER — Other Ambulatory Visit: Payer: Self-pay | Admitting: Family Medicine

## 2024-09-22 ENCOUNTER — Ambulatory Visit
Admission: RE | Admit: 2024-09-22 | Discharge: 2024-09-22 | Disposition: A | Discharge status: Home / Self Care | Source: Ambulatory Visit | Attending: Family Medicine | Admitting: Family Medicine

## 2024-09-22 DIAGNOSIS — Z1231 Encounter for screening mammogram for malignant neoplasm of breast: Secondary | ICD-10-CM | POA: Diagnosis not present

## 2024-09-26 MED ORDER — BENAZEPRIL-HYDROCHLOROTHIAZIDE 20-12.5 MG PO TABS
1.0000 | ORAL_TABLET | Freq: Every day | ORAL | 1 refills | Status: AC
Start: 2024-09-26 — End: ?

## 2024-09-26 NOTE — Addendum Note (Signed)
 Addended by: RILLA BALLER on: 09/26/2024 12:54 PM   Modules accepted: Orders

## 2024-09-26 NOTE — Telephone Encounter (Addendum)
 Seen in office today during husband's appt She is off spironolactone (made her feel ill) and is back on benazepril /hydrochlorothiazide  with benefit.  She is off plain benazepril .

## 2024-09-28 ENCOUNTER — Ambulatory Visit: Payer: Self-pay | Admitting: Family Medicine

## 2024-10-12 ENCOUNTER — Ambulatory Visit: Payer: Medicare Other

## 2024-10-12 VITALS — Ht 61.0 in | Wt 176.0 lb

## 2024-10-12 DIAGNOSIS — Z0000 Encounter for general adult medical examination without abnormal findings: Secondary | ICD-10-CM | POA: Diagnosis not present

## 2024-10-12 NOTE — Progress Notes (Signed)
 Chief Complaint  Patient presents with   Medicare Wellness     Subjective:   Katherine Oliver is a 78 y.o. female who presents for a Medicare Annual Wellness Visit.  Visit info / Clinical Intake: Medicare Wellness Visit Type:: Subsequent Annual Wellness Visit Persons participating in visit and providing information:: patient Medicare Wellness Visit Mode:: Telephone If telephone:: video declined Since this visit was completed virtually, some vitals may be partially provided or unavailable. Missing vitals are due to the limitations of the virtual format.: Unable to obtain vitals - no equipment If Telephone or Video please confirm:: I connected with patient using audio/video enable telemedicine. I verified patient identity with two identifiers, discussed telehealth limitations, and patient agreed to proceed. Patient Location:: home Provider Location:: home office Interpreter Needed?: No Pre-visit prep was completed: yes AWV questionnaire completed by patient prior to visit?: no Living arrangements:: lives with spouse/significant other Patient's Overall Health Status Rating: (!) fair Typical amount of pain: some Does pain affect daily life?: (!) yes Are you currently prescribed opioids?: no  Dietary Habits and Nutritional Risks How many meals a day?: 3 Eats fruit and vegetables daily?: yes Most meals are obtained by: preparing own meals Diabetic:: (!) yes Any non-healing wounds?: no How often do you check your BS?: continuous glucose monitor Would you like to be referred to a Nutritionist or for Diabetic Management? : no  Functional Status Activities of Daily Living (to include ambulation/medication): Independent Ambulation: Independent Medication Administration: Independent Home Management (perform basic housework or laundry): Independent Manage your own finances?: yes Primary transportation is: driving Concerns about vision?: no *vision screening is required for  WTM* Concerns about hearing?: no  Fall Screening Falls in the past year?: 0 Number of falls in past year: 0 Was there an injury with Fall?: 0 Fall Risk Category Calculator: 0 Patient Fall Risk Level: Low Fall Risk  Fall Risk Patient at Risk for Falls Due to: No Fall Risks Fall risk Follow up: Falls evaluation completed; Education provided; Falls prevention discussed  Home and Transportation Safety: All rugs have non-skid backing?: N/A, no rugs All stairs or steps have railings?: yes Grab bars in the bathtub or shower?: yes Have non-skid surface in bathtub or shower?: yes Good home lighting?: yes Regular seat belt use?: yes Hospital stays in the last year:: no  Cognitive Assessment Difficulty concentrating, remembering, or making decisions? : no Will 6CIT or Mini Cog be Completed: yes What year is it?: 0 points What month is it?: 0 points Give patient an address phrase to remember (5 components): [address] [place]  About what time is it?: 0 points Count backwards from 20 to 1: 0 points Say the months of the year in reverse: 0 points Repeat the address phrase from earlier: 0 points 6 CIT Score: 0 points  Advance Directives (For Healthcare) Does Patient Have a Medical Advance Directive?: Yes Type of Advance Directive: Living will; Healthcare Power of Attorney Copy of Healthcare Power of Attorney in Chart?: No - copy requested Copy of Living Will in Chart?: No - copy requested  Reviewed/Updated  Reviewed/Updated: Reviewed All (Medical, Surgical, Family, Medications, Allergies, Care Teams, Patient Goals)    Allergies (verified) Azithromycin , Nickel, Spironolactone, Sulfa antibiotics, Vinegar [acetic acid], Adhesive [tape], and Keflex  [cephalexin ]   Current Medications (verified) Outpatient Encounter Medications as of 10/12/2024  Medication Sig   ACCU-CHEK AVIVA PLUS test strip 1 EACH BY OTHER ROUTE 3 (THREE) TIMES DAILY. AND LANCETS 4/DAY   acetaminophen   (TYLENOL ) 500 MG  tablet Take 500 mg by mouth 2 (two) times daily.   amLODipine  (NORVASC ) 10 MG tablet Take 1 tablet (10 mg total) by mouth daily.   Baclofen  5 MG TABS Take 1 tablet (5 mg total) by mouth at bedtime as needed.   benazepril -hydrochlorthiazide (LOTENSIN  HCT) 20-12.5 MG tablet Take 1 tablet by mouth daily.   Cholecalciferol (VITAMIN D3) 25 MCG (1000 UT) CAPS Take 2 capsules (2,000 Units total) by mouth daily.   Continuous Glucose Sensor (DEXCOM G7 SENSOR) MISC Use on skin q10 days to check sugars E11.65, Z79.4   CRANBERRY PO Take 4,200 mg by mouth 2 (two) times daily.   CREON  36000-114000 units CPEP capsule Take 2 capsules (72,000 Units total) by mouth 3 (three) times daily before meals.   D-MANNOSE PO Take 2 tablets by mouth daily with lunch.   estradiol  (ESTRACE ) 0.1 MG/GM vaginal cream (DISCARD APPLICATOR) APPLY PEA SIZED AMOUNT TO TIP OF FINGER TO URETHRA BEFORE BED. WASH HANDS WELL AFTER APPLICATION. USE MONDAY, WEDNESDAY AND FRIDAY   ferrous sulfate  325 (65 FE) MG tablet Take 1 tablet (325 mg total) by mouth every other day.   fluticasone  (FLONASE ) 50 MCG/ACT nasal spray Place 1 spray into both nostrils daily as needed for allergies or rhinitis.   folic acid  (FOLVITE ) 1 MG tablet TAKE 1 TABLET BY MOUTH DAILY   Glucagon  (BAQSIMI  ONE PACK) 3 MG/DOSE POWD Place 3 mg into the nose daily as needed (hypoglycemia).   insulin  glargine (LANTUS  SOLOSTAR) 100 UNIT/ML Solostar Pen INJECT 26 UNITS INTO THE SKIN AT BEDTIME   insulin  lispro (HUMALOG  KWIKPEN) 200 UNIT/ML KwikPen MAX DAILY 100 UNITS   Insulin  Pen Needle (B-D UF III MINI PEN NEEDLES) 31G X 5 MM MISC Inject 1 Device into the skin 4 (four) times daily.   loperamide  (IMODIUM  A-D) 2 MG tablet Take 4 mg by mouth 4 (four) times daily as needed for diarrhea or loose stools.   Magnesium  400 MG TABS Take 1 tablet by mouth daily.   meclizine  (ANTIVERT ) 25 MG tablet Take 1 tablet (25 mg total) by mouth 3 (three) times daily as needed for  nausea.   metoprolol  succinate (TOPROL -XL) 50 MG 24 hr tablet TAKE 1 TABLET BY MOUTH DAILY  WITH OR IMMEDIATELY FOLLOWING A  MEAL   metroNIDAZOLE  (METROCREAM ) 0.75 % cream Apply twice daily to face as needed for rosacea   Multiple Vitamin (MULTIVITAMIN WITH MINERALS) TABS tablet Take 1 tablet by mouth daily.   pantoprazole  (PROTONIX ) 40 MG tablet Take 1 tablet (40 mg total) by mouth every evening.   rosuvastatin  (CRESTOR ) 10 MG tablet Take 1 tablet (10 mg total) by mouth daily.   RSV vaccine recomb adjuvanted (AREXVY ) 120 MCG/0.5ML injection Inject into the muscle.   triamcinolone  (KENALOG ) 0.1 % paste APPLY SPARINGLY 2-3 TIMES DAILY AS DIRECTED   No facility-administered encounter medications on file as of 10/12/2024.    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 nephropathy (HCC)    DSME at [facility] 01/2016    COVID-19 virus infection 11/10/2022   DKA (diabetic ketoacidoses)  04/22/2020   Frequent epistaxis 05/16/2019   S/p cauterization with resolution 2020   GERD (gastroesophageal reflux disease)    Hepatic steatosis    by abd US  05/2012, mild transaminitis - normal iron sat and viral hep panel (2011), stable US  2017   History of chicken pox    History of measles    History of recurrent UTIs    on chronic keflex    HLD (hyperlipidemia)    HTN (hypertension)    Hypertensive retinopathy of both eyes, 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   Bulakowski   Kidney cyst, acquired 01/2016   L kidney by US    Kidney stone 01/2016   L kidney by US    Lung nodules 11/2013   overall stable on f/u CT 01/2016   Osteopenia 06/2013   mild,  forearm T -1.1, hip and spine WNL   Pancreatitis    Polycythemia    mild, stable (2013)   Primary localized osteoarthritis of right knee 01/05/2019   Rosacea    metrogel    Ulcer 2021   ulcers treated and are gone   Past Surgical History:  Procedure Laterality Date   APPENDECTOMY  1987   BILIARY STENT PLACEMENT  12/14/2019   Procedure: BILIARY STENT PLACEMENT;  Surgeon: Rollin Dover, MD;  Location: [facility] ENDOSCOPY;  Service: Endoscopy;;   BREAST BIOPSY Right 1963   benign   BREAST BIOPSY Right 08/2014   benign- core   cardiolite stress test  04/2004   normal   CESAREAN SECTION  8023;8020   x2   CHOLECYSTECTOMY  2003   COLONOSCOPY  09/26/2008   adenomatous polyp, rpt 3-5 yrs   COLONOSCOPY  08/2012   adenomatous polyps, diverticulosis, rec rpt 5 yrs Jonne)   COLONOSCOPY N/A 05/30/2024   TAx2, diverticulosis, no f/u needed Gilberto, Ole DASEN, MD)   COLONOSCOPY WITH PROPOFOL  N/A 02/05/2018   4TA, SSA, diverticulosis, rpt 3 yrs Jonne, Gladis PENNER, MD)   COLONOSCOPY WITH PROPOFOL  N/A 10/14/2022   fair prep, TAx2 one was 1cm in size, diverticulosis, rpt 1 yr given fair prep Gilberto, Ole DASEN, MD)   COSMETIC SURGERY  2022   lower  eyelids to prevent rubbing on eye surface   dexa  2003   normal   dexa  06/2013   [facility] - Tscore -1.1 forearm, normal spine and femur   ERCP N/A 12/14/2019   Procedure: ENDOSCOPIC RETROGRADE CHOLANGIOPANCREATOGRAPHY (ERCP);  Surgeon: Rollin Dover, MD;  Location: [facility] ENDOSCOPY;  Service: Endoscopy;  Laterality: N/A;   ERCP  01/2020   nonbleeding gastric ulcer - [facility] hospitalization (Dr Queenie)   ESOPHAGOGASTRODUODENOSCOPY N/A 12/19/2019   Procedure: ESOPHAGOGASTRODUODENOSCOPY (EGD);  Surgeon: Kristie Lamprey, MD;  Location: [facility] ENDOSCOPY;  Service: Endoscopy;  Laterality: N/A;   ESOPHAGOGASTRODUODENOSCOPY  01/2020   pre existing AXIOS cystogastrostomy stent s/p necrosectomy - [facility] hospitalization (Dr Queenie)   ESOPHAGOGASTRODUODENOSCOPY N/A  04/24/2020   Procedure: ESOPHAGOGASTRODUODENOSCOPY (EGD);  Surgeon: Lennard Lesta FALCON, MD;  Location: [facility] ENDOSCOPY;  Service: Endoscopy;  Laterality: N/A;   ESOPHAGOGASTRODUODENOSCOPY  05/2020   normal esophagus, duodenum, erythematous mucosa in gastric body, gastric cystogastrostomy stent removed ([facility])   IR FLUORO GUIDE CV LINE RIGHT  12/27/2019   IR FLUORO GUIDE CV LINE RIGHT  04/24/2020   IR IVC FILTER PLMT / S&I /IMG GUID/MOD SED  04/26/2020   IR IVC FILTER RETRIEVAL / S&I /IMG GUID/MOD SED  07/31/2021   IR RADIOLOGIST EVAL & MGMT  07/31/2020   IR RADIOLOGIST EVAL & MGMT  01/15/2021   IR RADIOLOGIST EVAL &  MGMT  06/19/2021   IR REMOVAL TUN CV CATH W/O FL  01/03/2020   IR REMOVAL TUN CV CATH W/O FL  04/29/2020   IR REPLC DUODEN/JEJUNO TUBE PERCUT W/FLUORO  04/01/2020   IR US  GUIDE VASC ACCESS RIGHT  04/24/2020   JOINT REPLACEMENT  March, 2020   right knee joint replacement   POLYPECTOMY  05/30/2024   Procedure: POLYPECTOMY, INTESTINE;  Surgeon: Maryruth Ole DASEN, MD;  Location: [facility] ENDOSCOPY;  Service: Endoscopy;;   SPHINCTEROTOMY  12/14/2019   Procedure: ANNETT;  Surgeon: Rollin Dover, MD;  Location: [facility] ENDOSCOPY;  Service: Endoscopy;;   TOTAL KNEE ARTHROPLASTY Right 01/17/2019   Procedure: TOTAL KNEE ARTHROPLASTY;  Surgeon: Jane Charleston, MD;  Location: [facility] ORS;  Service: Orthopedics;  Laterality: Right;   TRANSTHORACIC ECHOCARDIOGRAM  04/2019   EF 55-60%, modLVH, impaired relaxation    TRIGGER FINGER RELEASE  2007;2010;2011   bilateral   TRIGGER FINGER RELEASE  02/2017   VAGINAL HYSTERECTOMY  1984   for menorrhagia, ovaries in place   Family History  Problem Relation Age of Onset   Stroke Mother        several   Hyperlipidemia Mother    Hypertension Mother    Arthritis Mother    Vision loss Mother    Cancer Father        colon   Hypertension Father    Hyperlipidemia Father    Coronary artery disease Father 44       MIx1, CABG   Heart disease Father     Cancer Paternal Aunt        abdominal   Coronary artery disease Maternal Grandmother    Diabetes Maternal Grandfather    Coronary artery disease Maternal Grandfather    Heart disease Maternal Grandfather    Varicose Veins Paternal Grandmother    Anxiety disorder Son    Diabetes Maternal Uncle    Hypertension Son    Miscarriages / Stillbirths Sister    Anxiety disorder Son    Hypertension Son    Miscarriages / Stillbirths Sister    Diabetes Maternal Uncle    Breast cancer Neg Hx    Social History   Occupational History   Not on file  Tobacco Use   Smoking status: Never    Passive exposure: Never   Smokeless tobacco: Never  Vaping Use   Vaping status: Never Used  Substance and Sexual Activity   Alcohol use: Not Currently   Drug use: Never   Sexual activity: Yes    Birth control/protection: Post-menopausal   Tobacco Counseling Counseling given: Not Answered  SDOH Screenings   Food Insecurity: No Food Insecurity (10/12/2024)  Housing: Low Risk  (10/12/2024)  Transportation Needs: No Transportation Needs (10/12/2024)  Utilities: Not At Risk (10/12/2024)  Alcohol Screen: Low Risk  (10/12/2024)  Depression (PHQ2-9): Low Risk  (10/12/2024)  Financial Resource Strain: Low Risk  (10/12/2024)  Physical Activity: Insufficiently Active (10/12/2024)  Social Connections: Socially Isolated (10/12/2024)  Stress: No Stress Concern Present (10/12/2024)  Tobacco Use: Low Risk  (10/12/2024)  Health Literacy: Adequate Health Literacy (10/12/2024)   See flowsheets for full screening details  Depression Screen PHQ 2 & 9 Depression Scale- Over the past 2 weeks, how often have you been bothered by any of the following problems? Little interest or pleasure in doing things: 0 Feeling down, depressed, or hopeless (PHQ Adolescent also includes...irritable): 0 PHQ-2 Total Score: 0 Trouble falling or staying asleep, or sleeping too much: 0 Feeling tired or having little energy: 2 Poor  appetite or overeating (PHQ Adolescent also includes...weight loss): 0 Feeling bad about yourself - or that you are a failure or have let yourself or your family down: 0 Trouble concentrating on things, such as reading the newspaper or watching television (PHQ Adolescent also includes...like school work): 0 Moving or speaking so slowly that other people could have noticed. Or the opposite - being so fidgety or restless that you have been moving around a lot more than usual: 0 Thoughts that you would be better off dead, or of hurting yourself in some way: 0 PHQ-9 Total Score: 3 If you checked off any problems, how difficult have these problems made it for you to do your work, take care of things at home, or get along with other people?: Somewhat difficult  Depression Treatment Depression Interventions/Treatment : Counseling     Goals Addressed             This Visit's Progress    Patient Stated   On track    Would like to exercise and walk more     Patient Stated       I would like to lose 10lbs by watching foods, eat less and healthy            Objective:    Today's Vitals   10/12/24 1129  Weight: 176 lb (79.8 kg)  Height: 5' 1 (1.549 m)   Body mass index is 33.25 kg/m.  Hearing/Vision screen Vision Screening - Comments:: UTD w/Dr Jerrald Freeman exam Immunizations and Health Maintenance Health Maintenance  Topic Date Due   Zoster Vaccines- Shingrix (1 of 2) 08[birth year]11/1965   COVID-19 Vaccine (3 - Pfizer risk series) 01/05/2020   Medicare Annual Wellness (AWV)  10/11/2024   HEMOGLOBIN A1C  10/15/2024   OPHTHALMOLOGY EXAM  03/17/2025   FOOT EXAM  04/15/2025   Diabetic kidney evaluation - eGFR measurement  05/23/2025   Diabetic kidney evaluation - Urine ACR  05/23/2025   Colonoscopy  05/30/2025   Mammogram  09/22/2025   DTaP/Tdap/Td (3 - Tdap) 08/11/2027   Pneumococcal Vaccine: 50+ Years  Completed   Influenza Vaccine  Completed   Bone Density Scan  Completed    Meningococcal B Vaccine  Aged Out   Hepatitis C Screening  Discontinued        Assessment/Plan:  This is a routine wellness examination for Makyah.  Patient Care Team: Rilla Baller, MD as PCP - General (Family Medicine) Jane Charleston, MD as Consulting Physician (Orthopedic Surgery) Hester Alm BROCKS, MD as Consulting Physician (Dermatology) Danley Shuck, MD as Consulting Physician (Dentistry) Portia Fireman, OD as Consulting Physician (Optometry)  I have personally reviewed and noted the following in the patients chart:   Medical and social history Use of alcohol, tobacco or illicit drugs  Current medications and supplements including opioid prescriptions. Functional ability and status Nutritional status Physical activity Advanced directives List of other physicians Hospitalizations, surgeries, and ER visits in previous 12 months Vitals Screenings to include cognitive, depression, and falls Referrals and appointments  No orders of the defined types were placed in this encounter.  In addition, I have reviewed and discussed with patient certain preventive protocols, quality metrics, and best practice recommendations. A written personalized care plan for preventive services as well as general preventive health recommendations were provided to patient.   Erminio LITTIE Saris, LPN   [id number]    After Visit Summary: (MyChart) Due to this being a telephonic visit, the after visit summary with patients personalized plan was offered to patient  via MyChart   Nurse Notes: No voiced or noted concerns at this time Patient advised to keep follow-up appointment with PCP (Jan 2026) and schedule AWV/CPE for Jan 2027

## 2024-10-12 NOTE — Patient Instructions (Signed)
 Katherine Oliver,  Thank you for taking the time for your Medicare Wellness Visit. I appreciate your continued commitment to your health goals. Please review the care plan we discussed, and feel free to reach out if I can assist you further.  Please note that Annual Wellness Visits do not include a physical exam. Some assessments may be limited, especially if the visit was conducted virtually. If needed, we may recommend an in-person follow-up with your provider.  Ongoing Care Seeing your primary care provider every 3 to 6 months helps us  monitor your health and provide consistent, personalized care.   Referrals If a referral was made during today's visit and you haven't received any updates within two weeks, please contact the referred provider directly to check on the status.  Recommended Screenings:  Health Maintenance  Topic Date Due   Zoster (Shingles) Vaccine (1 of 2) 08[birth year]11/1965   COVID-19 Vaccine (3 - Pfizer risk series) 01/05/2020   Medicare Annual Wellness Visit  10/11/2024   Hemoglobin A1C  10/15/2024   Eye exam for diabetics  03/17/2025   Complete foot exam   04/15/2025   Yearly kidney function blood test for diabetes  05/23/2025   Yearly kidney health urinalysis for diabetes  05/23/2025   Colon Cancer Screening  05/30/2025   Breast Cancer Screening  09/22/2025   DTaP/Tdap/Td vaccine (3 - Tdap) 08/11/2027   Pneumococcal Vaccine for age over 30  Completed   Flu Shot  Completed   Osteoporosis screening with Bone Density Scan  Completed   Meningitis B Vaccine  Aged Out   Hepatitis C Screening  Discontinued       10/12/2024   11:37 AM  Advanced Directives  Does Patient Have a Medical Advance Directive? Yes  Type of Advance Directive Living will;Healthcare Power of Attorney  Copy of Healthcare Power of Attorney in Chart? No - copy requested    Vision: Annual vision screenings are recommended for early detection of glaucoma, cataracts, and diabetic retinopathy. These  exams can also reveal signs of chronic conditions such as diabetes and high blood pressure.  Dental: Annual dental screenings help detect early signs of oral cancer, gum disease, and other conditions linked to overall health, including heart disease and diabetes.

## 2024-10-31 ENCOUNTER — Other Ambulatory Visit: Payer: Self-pay | Admitting: Family Medicine

## 2024-10-31 DIAGNOSIS — I10 Essential (primary) hypertension: Secondary | ICD-10-CM

## 2024-10-31 DIAGNOSIS — K8689 Other specified diseases of pancreas: Secondary | ICD-10-CM

## 2024-10-31 DIAGNOSIS — E1169 Type 2 diabetes mellitus with other specified complication: Secondary | ICD-10-CM

## 2024-11-01 ENCOUNTER — Encounter: Payer: Self-pay | Admitting: Internal Medicine

## 2024-11-01 ENCOUNTER — Ambulatory Visit (INDEPENDENT_AMBULATORY_CARE_PROVIDER_SITE_OTHER): Admitting: Internal Medicine

## 2024-11-01 VITALS — BP 144/78 | Ht 61.0 in | Wt 180.0 lb

## 2024-11-01 DIAGNOSIS — N1832 Chronic kidney disease, stage 3b: Secondary | ICD-10-CM | POA: Diagnosis not present

## 2024-11-01 DIAGNOSIS — E1165 Type 2 diabetes mellitus with hyperglycemia: Secondary | ICD-10-CM

## 2024-11-01 DIAGNOSIS — Z794 Long term (current) use of insulin: Secondary | ICD-10-CM

## 2024-11-01 DIAGNOSIS — E1122 Type 2 diabetes mellitus with diabetic chronic kidney disease: Secondary | ICD-10-CM | POA: Diagnosis not present

## 2024-11-01 LAB — POCT GLYCOSYLATED HEMOGLOBIN (HGB A1C): Hemoglobin A1C: 7.3 % — AB (ref 4.0–5.6)

## 2024-11-01 MED ORDER — LANTUS SOLOSTAR 100 UNIT/ML ~~LOC~~ SOPN: 30.0000 [IU] | PEN_INJECTOR | Freq: Every day | SUBCUTANEOUS | 3 refills | Status: DC

## 2024-11-01 MED ORDER — OMNIPOD 5 G7 PODS (GEN 5) MISC: 1.0000 | 3 refills | Status: AC

## 2024-11-01 MED ORDER — HUMALOG KWIKPEN 200 UNIT/ML ~~LOC~~ SOPN: PEN_INJECTOR | SUBCUTANEOUS | 4 refills | Status: DC

## 2024-11-01 MED ORDER — OMNIPOD 5 G7 INTRO (GEN 5) KIT: 1.0000 | PACK | 0 refills | Status: AC

## 2024-11-01 MED ORDER — BD PEN NEEDLE MINI U/F 31G X 5 MM MISC: 1.0000 | Freq: Four times a day (QID) | 3 refills | Status: AC

## 2024-11-01 NOTE — Progress Notes (Unsigned)
 "   Name: Katherine Oliver  Age/ Sex: 78 y.o., female   MRN/ DOB: [id number], 05-07-1946     PCP: Rilla Baller, MD   Reason for Endocrinology Evaluation: Type 2 Diabetes Mellitus  Initial Endocrine Consultative Visit: 11/25/2021    PATIENT IDENTIFIER: Katherine Oliver is a 78 y.o. female with a past medical history of DM, HTN, Hx of pancreatitis . The patient has followed with Endocrinology clinic since 11/25/2021 for consultative assistance with management of her diabetes.  DIABETIC HISTORY:  Katherine Oliver was diagnosed with DM 2015, and started insulin  therapy in 2021.Pt has Hx of pancreatitis . Her hemoglobin A1c has ranged from 6.5% in 2016, peaking at 8.3% in 2023.   Hx of DKA in 2021  Saw Dr. Kassie from  2016 until 01/2022  SUBJECTIVE:   During the last visit (04/15/2024): A1c 7.3%     Today (11/01/2024): Katherine Oliver  She checks her blood sugars multiple  times daily, through CGM. The patient has not had hypoglycemic episodes since the last clinic visit.  The patient continues to follow-up with nephrology for CKD  She has a history of recurrent UTIs , requiring urology follow-up at some point.  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- takes imodium      HOME DIABETES REGIMEN:  Lantus  30 units daily  Humalog  24 units TIDQAC Humalog  10-16 units with bedtime snack CF : Humalog  (BG-130/30)    Statin: yes ACE-I/ARB: yes     CONTINUOUS GLUCOSE MONITORING RECORD INTERPRETATION    Dates of Recording:12/17-12/30/2025  Sensor description:dexcom  Results statistics:   CGM use % of time 97  Average and SD 211/51  Time in range 26 %  % Time Above 180 52  % Time above 250 22  % Time Below target 0    Glycemic patterns summary: BG's trend down overnight and increased throughout the day Hyperglycemic episodes  postprandial   Hypoglycemic episodes occurred N/A  Overnight  periods: Trends down    DIABETIC COMPLICATIONS: Microvascular complications:  CKD III Denies:  Last Eye Exam: Completed 03/24/2022  Macrovascular complications:   Denies: CAD, CVA, PVD   HISTORY:  Past Medical History:  Past Medical History:  Diagnosis Date   Allergic rhinitis    Allergy 1967   Anemia 2021   Arthritis 2010   Blood transfusion without reported diagnosis 2021   several in 2021   Breast mass, right 08/2014   biopsy benign - [facility]   Cataract 2015   Clotting disorder 2019   nosebleeds, cauterized 2020   Colon polyp 09/2008   tubulovillous adenoma, rpt 3-5 yrs   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 nephropathy (HCC)    DSME at [facility] 01/2016    COVID-19 virus infection 11/10/2022   DKA (diabetic ketoacidoses) 04/22/2020   Frequent epistaxis 05/16/2019   S/p cauterization with resolution 2020   GERD (gastroesophageal reflux disease)    Hepatic steatosis    by abd US  05/2012, mild transaminitis - normal iron sat and viral hep panel (2011), stable US  2017   History of chicken pox    History of measles    History of recurrent UTIs    on chronic keflex    HLD (hyperlipidemia)    HTN (hypertension)    Hypertensive retinopathy of both eyes, 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   Bulakowski   Kidney cyst, acquired 01/2016   L kidney by US    Kidney stone 01/2016  L kidney by US    Lung nodules 11/2013   overall stable on f/u CT 01/2016   Osteopenia 06/2013   mild, forearm T -1.1, hip and spine WNL   Pancreatitis    Polycythemia    mild, stable (2013)   Primary localized osteoarthritis of right knee 01/05/2019   Rosacea    metrogel    Ulcer 2021   ulcers treated and are gone   Past Surgical History:  Past Surgical History:  Procedure Laterality Date   APPENDECTOMY  1987   BILIARY STENT PLACEMENT  12/14/2019   Procedure: BILIARY STENT PLACEMENT;  Surgeon: Rollin Dover, MD;  Location: [facility] ENDOSCOPY;  Service: Endoscopy;;   BREAST BIOPSY Right 1963   benign    BREAST BIOPSY Right 08/2014   benign- core   cardiolite stress test  04/2004   normal   CESAREAN SECTION  8023;8020   x2   CHOLECYSTECTOMY  2003   COLONOSCOPY  09/26/2008   adenomatous polyp, rpt 3-5 yrs   COLONOSCOPY  08/2012   adenomatous polyps, diverticulosis, rec rpt 5 yrs Jonne)   COLONOSCOPY N/A 05/30/2024   TAx2, diverticulosis, no f/u needed Gilberto, Ole DASEN, MD)   COLONOSCOPY WITH PROPOFOL  N/A 02/05/2018   4TA, SSA, diverticulosis, rpt 3 yrs Jonne, Gladis PENNER, MD)   COLONOSCOPY WITH PROPOFOL  N/A 10/14/2022   fair prep, TAx2 one was 1cm in size, diverticulosis, rpt 1 yr given fair prep Gilberto, Ole DASEN, MD)   COSMETIC SURGERY  2022   lower  eyelids to prevent rubbing on eye surface   dexa  2003   normal   dexa  06/2013   [facility] - Tscore -1.1 forearm, normal spine and femur   ERCP N/A 12/14/2019   Procedure: ENDOSCOPIC RETROGRADE CHOLANGIOPANCREATOGRAPHY (ERCP);  Surgeon: Rollin Dover, MD;  Location: [facility] ENDOSCOPY;  Service: Endoscopy;  Laterality: N/A;   ERCP  01/2020   nonbleeding gastric ulcer - [facility] hospitalization (Dr Queenie)   ESOPHAGOGASTRODUODENOSCOPY N/A 12/19/2019   Procedure: ESOPHAGOGASTRODUODENOSCOPY (EGD);  Surgeon: Kristie Lamprey, MD;  Location: [facility] ENDOSCOPY;  Service: Endoscopy;  Laterality: N/A;   ESOPHAGOGASTRODUODENOSCOPY  01/2020   pre existing AXIOS cystogastrostomy stent s/p necrosectomy - [facility] hospitalization (Dr Queenie)   ESOPHAGOGASTRODUODENOSCOPY N/A 04/24/2020   Procedure: ESOPHAGOGASTRODUODENOSCOPY (EGD);  Surgeon: Lennard Lesta FALCON, MD;  Location: [facility] ENDOSCOPY;  Service: Endoscopy;  Laterality: N/A;   ESOPHAGOGASTRODUODENOSCOPY  05/2020   normal esophagus, duodenum, erythematous mucosa in gastric body, gastric cystogastrostomy stent removed ([facility])   IR FLUORO GUIDE CV LINE RIGHT  12/27/2019   IR FLUORO GUIDE CV LINE RIGHT  04/24/2020   IR IVC FILTER PLMT / S&I /IMG GUID/MOD SED  04/26/2020   IR IVC FILTER RETRIEVAL / S&I /IMG  GUID/MOD SED  07/31/2021   IR RADIOLOGIST EVAL & MGMT  07/31/2020   IR RADIOLOGIST EVAL & MGMT  01/15/2021   IR RADIOLOGIST EVAL & MGMT  06/19/2021   IR REMOVAL TUN CV CATH W/O FL  01/03/2020   IR REMOVAL TUN CV CATH W/O FL  04/29/2020   IR REPLC DUODEN/JEJUNO TUBE PERCUT W/FLUORO  04/01/2020   IR US  GUIDE VASC ACCESS RIGHT  04/24/2020   JOINT REPLACEMENT  March, 2020   right knee joint replacement   POLYPECTOMY  05/30/2024   Procedure: POLYPECTOMY, INTESTINE;  Surgeon: Maryruth Ole DASEN, MD;  Location: [facility] ENDOSCOPY;  Service: Endoscopy;;   SPHINCTEROTOMY  12/14/2019   Procedure: ANNETT;  Surgeon: Rollin Dover, MD;  Location: [facility] ENDOSCOPY;  Service: Endoscopy;;   TOTAL KNEE ARTHROPLASTY Right  01/17/2019   Procedure: TOTAL KNEE ARTHROPLASTY;  Surgeon: Jane Charleston, MD;  Location: [facility] ORS;  Service: Orthopedics;  Laterality: Right;   TRANSTHORACIC ECHOCARDIOGRAM  04/2019   EF 55-60%, modLVH, impaired relaxation    TRIGGER FINGER RELEASE  2007;2010;2011   bilateral   TRIGGER FINGER RELEASE  02/2017   VAGINAL HYSTERECTOMY  1984   for menorrhagia, ovaries in place   Social History:  reports that she has never smoked. She has never been exposed to tobacco smoke. She has never used smokeless tobacco. She reports that she does not currently use alcohol. She reports that she does not use drugs. Family History:  Family History  Problem Relation Age of Onset   Stroke Mother        several   Hyperlipidemia Mother    Hypertension Mother    Arthritis Mother    Vision loss Mother    Cancer Father        colon   Hypertension Father    Hyperlipidemia Father    Coronary artery disease Father 30       MIx1, CABG   Heart disease Father    Cancer Paternal Aunt        abdominal   Coronary artery disease Maternal Grandmother    Diabetes Maternal Grandfather    Coronary artery disease Maternal Grandfather    Heart disease Maternal Grandfather    Varicose Veins Paternal  Grandmother    Anxiety disorder Son    Diabetes Maternal Uncle    Hypertension Son    Miscarriages / Stillbirths Sister    Anxiety disorder Son    Hypertension Son    Miscarriages / Stillbirths Sister    Diabetes Maternal Uncle    Breast cancer Neg Hx      HOME MEDICATIONS: Allergies as of 11/01/2024       Reactions   Azithromycin  Itching   Okay if takes benadryl  along with it   Nickel    Reaction to cheap earrings cause redness   Spironolactone Other (See Comments)   Made her more angry/irritable    Sulfa Antibiotics Itching   Vinegar [acetic Acid] Nausea Only   Adhesive [tape] Rash   Paper tape only causes blisters   Keflex  [cephalexin ] Rash        Medication List        Accurate as of November 01, 2024  1:37 PM. If you have any questions, ask your nurse or doctor.          Accu-Chek Aviva Plus test strip Generic drug: glucose blood 1 EACH BY OTHER ROUTE 3 (THREE) TIMES DAILY. AND LANCETS 4/DAY   acetaminophen  500 MG tablet Commonly known as: TYLENOL  Take 500 mg by mouth 2 (two) times daily.   amLODipine  10 MG tablet Commonly known as: NORVASC  Take 1 tablet (10 mg total) by mouth daily.   Arexvy  120 MCG/0.5ML injection Generic drug: RSV vaccine recomb adjuvanted Inject into the muscle.   B-D UF III MINI PEN NEEDLES 31G X 5 MM Misc Generic drug: Insulin  Pen Needle Inject 1 Device into the skin 4 (four) times daily.   Baclofen  5 MG Tabs Take 1 tablet (5 mg total) by mouth at bedtime as needed.   Baqsimi  One Pack 3 MG/DOSE Powd Generic drug: Glucagon  Place 3 mg into the nose daily as needed (hypoglycemia).   benazepril -hydrochlorthiazide 20-12.5 MG tablet Commonly known as: LOTENSIN  HCT Take 1 tablet by mouth daily.   CRANBERRY PO Take 4,200 mg by mouth 2 (two) times daily.  Creon  36000-114000 units Cpep capsule Generic drug: lipase/protease/amylase TAKE 2 CAPSULES BY MOUTH 3 TIMES DAILY BEFORE MEALS   D-MANNOSE PO Take 2 tablets by  mouth daily with lunch.   Dexcom G7 Sensor Misc Use on skin q10 days to check sugars E11.65, Z79.4   estradiol  0.1 MG/GM vaginal cream Commonly known as: ESTRACE  (DISCARD APPLICATOR) APPLY PEA SIZED AMOUNT TO TIP OF FINGER TO URETHRA BEFORE BED. WASH HANDS WELL AFTER APPLICATION. USE MONDAY, WEDNESDAY AND FRIDAY   ferrous sulfate  325 (65 FE) MG tablet Take 1 tablet (325 mg total) by mouth every other day.   fluticasone  50 MCG/ACT nasal spray Commonly known as: FLONASE  Place 1 spray into both nostrils daily as needed for allergies or rhinitis.   folic acid  1 MG tablet Commonly known as: FOLVITE  TAKE 1 TABLET BY MOUTH DAILY   HumaLOG  KwikPen 200 UNIT/ML KwikPen Generic drug: insulin  lispro MAX DAILY 100 UNITS   Lantus  SoloStar 100 UNIT/ML Solostar Pen Generic drug: insulin  glargine INJECT 26 UNITS INTO THE SKIN AT BEDTIME   loperamide  2 MG tablet Commonly known as: IMODIUM  A-D Take 4 mg by mouth 4 (four) times daily as needed for diarrhea or loose stools.   Magnesium  400 MG Tabs Take 1 tablet by mouth daily.   meclizine  25 MG tablet Commonly known as: ANTIVERT  Take 1 tablet (25 mg total) by mouth 3 (three) times daily as needed for nausea.   metoprolol  succinate 50 MG 24 hr tablet Commonly known as: TOPROL -XL TAKE 1 TABLET BY MOUTH DAILY  WITH OR IMMEDIATELY FOLLOWING A  MEAL   metroNIDAZOLE  0.75 % cream Commonly known as: METROCREAM  Apply twice daily to face as needed for rosacea   multivitamin with minerals Tabs tablet Take 1 tablet by mouth daily.   pantoprazole  40 MG tablet Commonly known as: PROTONIX  Take 1 tablet (40 mg total) by mouth every evening.   rosuvastatin  10 MG tablet Commonly known as: CRESTOR  TAKE 1 TABLET BY MOUTH DAILY   triamcinolone  0.1 % paste Commonly known as: KENALOG  APPLY SPARINGLY 2-3 TIMES DAILY AS DIRECTED   Vitamin D3 25 MCG (1000 UT) Caps Take 2 capsules (2,000 Units total) by mouth daily.         OBJECTIVE:   Vital  Signs: BP (!) 144/78   Ht 5' 1 (1.549 m)   Wt 180 lb (81.6 kg)   BMI 34.01 kg/m   Wt Readings from Last 3 Encounters:  11/01/24 180 lb (81.6 kg)  10/12/24 176 lb (79.8 kg)  08/09/24 176 lb 8 oz (80.1 kg)     Exam: General: Pt appears well and is in NAD  Lungs: Clear with good BS bilat   Heart: RRR   Extremities: No pretibial edema.  Neuro: MS is good with appropriate affect, pt is alert and Ox3    DM foot exam:04/15/2024  The skin of the feet is intact without sores or ulcerations. The pedal pulses are 2+ on right and 2+ on left. The sensation is intact to a screening 5.07, 10 gram monofilament bilaterally        DATA REVIEWED:  Lab Results  Component Value Date   HGBA1C 7.3 (A) 11/01/2024   HGBA1C 7.3 (A) 04/15/2024   HGBA1C 7.8 (H) 02/05/2024   07/21/2024 BUN 31 Creatinine 1.36 GFR 40 Protein/creatinine ratio 0.648   ASSESSMENT / PLAN / RECOMMENDATIONS:   1) Type 2 Diabetes Mellitus, Optimally controlled, With CKD III complications - Most recent A1c of 7.3 %. Goal A1c < 7.5 %.    -  A1c remains stable but above goal -She is NOT a candidate for GLP-1 agonist, DPP 4 inhibitors nor Mounjaro due to history of pancreatitis -Limited glycemic agents due to CKD -Not a candidate for SGLT2 inhibitors due to history of recurrent UTIs - Patient continues with the occasional snacking on desserts at night, but she does bolus with 16 units of Humalog  - She is interested in insulin  pump technology, she is interested in the OmniPod.  A prescription was sent to the pharmacy, if this is covered and she continues to pursue this, the patient will need to notify our office so I can refer her to our CDE for training - I will increase her prandial insulin  as below as the patient continues with postprandial hyperglycemia - I will also change sensitivity factor from 30 to 25  MEDICATIONS: Continue Lantus  30 units daily Change Humalog  28 units with each meal Continue Humalog  16 units  with bedtime snack Change  correction factor: Humalog  (BG -130/25)  EDUCATION / INSTRUCTIONS: BG monitoring instructions: Patient is instructed to check her blood sugars 4 times a day, before each meal ad bedtime . Call [facility] if: BG persistently < 70  I reviewed the Rule of 15 for the treatment of hypoglycemia in detail with the patient. Literature supplied.     2) Diabetic complications:  Eye: Does not have known diabetic retinopathy.  Neuro/ Feet: Does not have known diabetic peripheral neuropathy .  Renal: Patient does  have known baseline CKD. She   is  on an ACEI/ARB at present.      F/U in 6 months   Signed electronically by: Stefano Redgie Butts, MD  [facility] Endocr[facility]ology  [facility] [address] [place], [place] [address] Phone: [phone] FAX: [phone]   CC: Rilla Baller, MD [address] [place] [address] Phone: [phone]  Fax: [phone]  Return to Endocrinology clinic as below: Future Appointments  Date Time Provider Department Center  11/22/2024  8:30 AM [facility] [address]  11/29/2024 12:00 PM Rilla Baller, MD [facility] [address]  03/21/2025 10:45 AM Jackquline Sawyer, MD [facility] None    "

## 2024-11-01 NOTE — Patient Instructions (Addendum)
 Continue  Lantus  30 units daily  Change Humalog  28 units with each meal  Snack time Humalog  16 units  Humalog  correctional insulin : ADD extra units on insulin  to your meal-time Humalog  dose if your blood sugars are higher than 155. Use the scale below to help guide you:   Blood sugar before meal Number of units to inject  Less than 155 0 unit  156 - 180 1 unit  181 - 205 2 units  206 - 230 3 units  231 - 255 4 units  256 - 280 5 units  281 - 305 6 units    HOW TO TREAT LOW BLOOD SUGARS (Blood sugar LESS THAN 70 MG/DL) Please follow the RULE OF 15 for the treatment of hypoglycemia treatment (when your (blood sugars are less than 70 mg/dL)   STEP 1: Take 15 grams of carbohydrates when your blood sugar is low, which includes:  3-4 GLUCOSE TABS  OR 3-4 OZ OF JUICE OR REGULAR SODA OR ONE TUBE OF GLUCOSE GEL    STEP 2: RECHECK blood sugar in 15 MINUTES STEP 3: If your blood sugar is still low at the 15 minute recheck --> then, go back to STEP 1 and treat AGAIN with another 15 grams of carbohydrates.

## 2024-11-09 ENCOUNTER — Other Ambulatory Visit: Payer: Self-pay

## 2024-11-09 ENCOUNTER — Telehealth: Payer: Self-pay

## 2024-11-09 DIAGNOSIS — E1165 Type 2 diabetes mellitus with hyperglycemia: Secondary | ICD-10-CM

## 2024-11-09 NOTE — Telephone Encounter (Signed)
 Patient received her omnipod and insulin .  She says the humalog  says not to use in pump.  She also request education on how to use pump.  Referral placed to [facility] per patient request.

## 2024-11-09 NOTE — Telephone Encounter (Signed)
Spoke with patient and she is aware of recommendations.

## 2024-11-10 ENCOUNTER — Encounter: Payer: Self-pay | Admitting: Family Medicine

## 2024-11-11 ENCOUNTER — Telehealth: Payer: Self-pay

## 2024-11-11 MED ORDER — HUMALOG KWIKPEN 200 UNIT/ML ~~LOC~~ SOPN: PEN_INJECTOR | SUBCUTANEOUS | 4 refills | Status: AC

## 2024-11-11 MED ORDER — LANTUS SOLOSTAR 100 UNIT/ML ~~LOC~~ SOPN: 30.0000 [IU] | PEN_INJECTOR | Freq: Every day | SUBCUTANEOUS | 3 refills | Status: AC

## 2024-11-11 NOTE — Telephone Encounter (Signed)
 Patient called and has decided she is not interested in using the omnipod and would just like to use the insulin  like she was.

## 2024-11-13 ENCOUNTER — Encounter: Payer: Self-pay | Admitting: Internal Medicine

## 2024-11-20 ENCOUNTER — Other Ambulatory Visit: Payer: Self-pay | Admitting: Family Medicine

## 2024-11-20 DIAGNOSIS — N1832 Chronic kidney disease, stage 3b: Secondary | ICD-10-CM

## 2024-11-20 DIAGNOSIS — K760 Fatty (change of) liver, not elsewhere classified: Secondary | ICD-10-CM

## 2024-11-20 DIAGNOSIS — D509 Iron deficiency anemia, unspecified: Secondary | ICD-10-CM

## 2024-11-20 DIAGNOSIS — E1169 Type 2 diabetes mellitus with other specified complication: Secondary | ICD-10-CM

## 2024-11-20 DIAGNOSIS — M85852 Other specified disorders of bone density and structure, left thigh: Secondary | ICD-10-CM

## 2024-11-20 NOTE — Addendum Note (Signed)
 Addended by: RILLA BALLER on: 11/20/2024 09:49 PM   Modules accepted: Orders

## 2024-11-22 ENCOUNTER — Other Ambulatory Visit (INDEPENDENT_AMBULATORY_CARE_PROVIDER_SITE_OTHER)

## 2024-11-22 DIAGNOSIS — E1122 Type 2 diabetes mellitus with diabetic chronic kidney disease: Secondary | ICD-10-CM

## 2024-11-22 DIAGNOSIS — N1832 Chronic kidney disease, stage 3b: Secondary | ICD-10-CM

## 2024-11-22 DIAGNOSIS — K760 Fatty (change of) liver, not elsewhere classified: Secondary | ICD-10-CM

## 2024-11-22 DIAGNOSIS — E1169 Type 2 diabetes mellitus with other specified complication: Secondary | ICD-10-CM

## 2024-11-22 DIAGNOSIS — M85852 Other specified disorders of bone density and structure, left thigh: Secondary | ICD-10-CM

## 2024-11-22 DIAGNOSIS — E785 Hyperlipidemia, unspecified: Secondary | ICD-10-CM | POA: Diagnosis not present

## 2024-11-22 DIAGNOSIS — Z794 Long term (current) use of insulin: Secondary | ICD-10-CM | POA: Diagnosis not present

## 2024-11-22 DIAGNOSIS — D509 Iron deficiency anemia, unspecified: Secondary | ICD-10-CM

## 2024-11-22 LAB — CBC WITH DIFFERENTIAL/PLATELET
Basophils Absolute: 0 K/uL (ref 0.0–0.1)
Basophils Relative: 0.7 % (ref 0.0–3.0)
Eosinophils Absolute: 0 K/uL (ref 0.0–0.7)
Eosinophils Relative: 0.1 % (ref 0.0–5.0)
HCT: 42.8 % (ref 36.0–46.0)
Hemoglobin: 13.9 g/dL (ref 12.0–15.0)
Lymphocytes Relative: 25.3 % (ref 12.0–46.0)
Lymphs Abs: 1.5 K/uL (ref 0.7–4.0)
MCHC: 32.5 g/dL (ref 30.0–36.0)
MCV: 83 fl (ref 78.0–100.0)
Monocytes Absolute: 0.4 K/uL (ref 0.1–1.0)
Monocytes Relative: 7.2 % (ref 3.0–12.0)
Neutro Abs: 4 K/uL (ref 1.4–7.7)
Neutrophils Relative %: 66.7 % (ref 43.0–77.0)
Platelets: 145 K/uL — ABNORMAL LOW (ref 150.0–400.0)
RBC: 5.15 Mil/uL — ABNORMAL HIGH (ref 3.87–5.11)
RDW: 15.4 % (ref 11.5–15.5)
WBC: 6 K/uL (ref 4.0–10.5)

## 2024-11-22 LAB — LIPID PANEL
Cholesterol: 157 mg/dL (ref 28–200)
HDL: 43.4 mg/dL
LDL Cholesterol: 53 mg/dL (ref 10–99)
NonHDL: 113.5
Total CHOL/HDL Ratio: 4
Triglycerides: 305 mg/dL — ABNORMAL HIGH (ref 10.0–149.0)
VLDL: 61 mg/dL — ABNORMAL HIGH (ref 0.0–40.0)

## 2024-11-22 LAB — MICROALBUMIN / CREATININE URINE RATIO
Creatinine,U: 68.3 mg/dL
Microalb Creat Ratio: 492.3 mg/g — ABNORMAL HIGH (ref 0.0–30.0)
Microalb, Ur: 33.6 mg/dL — ABNORMAL HIGH (ref 0.7–1.9)

## 2024-11-22 LAB — COMPREHENSIVE METABOLIC PANEL WITH GFR
ALT: 26 U/L (ref 3–35)
AST: 20 U/L (ref 5–37)
Albumin: 4.6 g/dL (ref 3.5–5.2)
Alkaline Phosphatase: 71 U/L (ref 39–117)
BUN: 35 mg/dL — ABNORMAL HIGH (ref 6–23)
CO2: 25 meq/L (ref 19–32)
Calcium: 9.6 mg/dL (ref 8.4–10.5)
Chloride: 104 meq/L (ref 96–112)
Creatinine, Ser: 1.38 mg/dL — ABNORMAL HIGH (ref 0.40–1.20)
GFR: 36.21 mL/min — ABNORMAL LOW
Glucose, Bld: 196 mg/dL — ABNORMAL HIGH (ref 70–99)
Potassium: 4.7 meq/L (ref 3.5–5.1)
Sodium: 137 meq/L (ref 135–145)
Total Bilirubin: 0.5 mg/dL (ref 0.2–1.2)
Total Protein: 7.1 g/dL (ref 6.0–8.3)

## 2024-11-22 LAB — IBC PANEL
Iron: 61 ug/dL (ref 42–145)
Saturation Ratios: 18.2 % — ABNORMAL LOW (ref 20.0–50.0)
TIBC: 334.6 ug/dL (ref 250.0–450.0)
Transferrin: 239 mg/dL (ref 212.0–360.0)

## 2024-11-22 LAB — MAGNESIUM: Magnesium: 1.6 mg/dL (ref 1.5–2.5)

## 2024-11-22 LAB — PHOSPHORUS: Phosphorus: 3.6 mg/dL (ref 2.3–4.6)

## 2024-11-22 LAB — FERRITIN: Ferritin: 218.8 ng/mL (ref 10.0–291.0)

## 2024-11-22 LAB — VITAMIN B12: Vitamin B-12: 420 pg/mL (ref 211–911)

## 2024-11-22 LAB — VITAMIN D 25 HYDROXY (VIT D DEFICIENCY, FRACTURES): VITD: 31.77 ng/mL (ref 30.00–100.00)

## 2024-11-23 ENCOUNTER — Ambulatory Visit: Payer: Self-pay | Admitting: Family Medicine

## 2024-11-23 LAB — PARATHYROID HORMONE, INTACT (NO CA): PTH: 51 pg/mL (ref 16–77)

## 2024-11-29 ENCOUNTER — Encounter: Admitting: Family Medicine

## 2025-01-10 ENCOUNTER — Encounter: Admitting: Family Medicine

## 2025-03-21 ENCOUNTER — Encounter: Admitting: Dermatology

## 2025-05-02 ENCOUNTER — Ambulatory Visit: Admitting: Internal Medicine
